# Patient Record
Sex: Female | Born: 1965
Health system: Southern US, Community
[De-identification: ages and names within clinical notes are randomized; demographics above are authoritative.]

## PROBLEM LIST (undated history)

## (undated) DIAGNOSIS — M1990 Unspecified osteoarthritis, unspecified site: Secondary | ICD-10-CM

## (undated) DIAGNOSIS — C541 Malignant neoplasm of endometrium: Secondary | ICD-10-CM

## (undated) DIAGNOSIS — R519 Headache, unspecified: Secondary | ICD-10-CM

## (undated) DIAGNOSIS — M349 Systemic sclerosis, unspecified: Secondary | ICD-10-CM

## (undated) DIAGNOSIS — K589 Irritable bowel syndrome without diarrhea: Secondary | ICD-10-CM

## (undated) DIAGNOSIS — D649 Anemia, unspecified: Secondary | ICD-10-CM

## (undated) DIAGNOSIS — K5780 Diverticulitis of intestine, part unspecified, with perforation and abscess without bleeding: Secondary | ICD-10-CM

## (undated) DIAGNOSIS — C569 Malignant neoplasm of unspecified ovary: Secondary | ICD-10-CM

## (undated) DIAGNOSIS — Z9889 Other specified postprocedural states: Secondary | ICD-10-CM

## (undated) DIAGNOSIS — E785 Hyperlipidemia, unspecified: Secondary | ICD-10-CM

## (undated) DIAGNOSIS — I82409 Acute embolism and thrombosis of unspecified deep veins of unspecified lower extremity: Secondary | ICD-10-CM

## (undated) DIAGNOSIS — R112 Nausea with vomiting, unspecified: Secondary | ICD-10-CM

## (undated) DIAGNOSIS — R7303 Prediabetes: Secondary | ICD-10-CM

## (undated) DIAGNOSIS — Z8669 Personal history of other diseases of the nervous system and sense organs: Secondary | ICD-10-CM

## (undated) DIAGNOSIS — F419 Anxiety disorder, unspecified: Secondary | ICD-10-CM

## (undated) DIAGNOSIS — J690 Pneumonitis due to inhalation of food and vomit: Secondary | ICD-10-CM

## (undated) DIAGNOSIS — M329 Systemic lupus erythematosus, unspecified: Secondary | ICD-10-CM

## (undated) DIAGNOSIS — I10 Essential (primary) hypertension: Secondary | ICD-10-CM

## (undated) DIAGNOSIS — R05 Cough: Secondary | ICD-10-CM

## (undated) DIAGNOSIS — N12 Tubulo-interstitial nephritis, not specified as acute or chronic: Secondary | ICD-10-CM

## (undated) DIAGNOSIS — Z8489 Family history of other specified conditions: Secondary | ICD-10-CM

## (undated) DIAGNOSIS — Z95828 Presence of other vascular implants and grafts: Secondary | ICD-10-CM

## (undated) DIAGNOSIS — R51 Headache: Secondary | ICD-10-CM

## (undated) DIAGNOSIS — K219 Gastro-esophageal reflux disease without esophagitis: Secondary | ICD-10-CM

## (undated) DIAGNOSIS — T7840XA Allergy, unspecified, initial encounter: Secondary | ICD-10-CM

## (undated) DIAGNOSIS — Z8709 Personal history of other diseases of the respiratory system: Secondary | ICD-10-CM

## (undated) DIAGNOSIS — Z5189 Encounter for other specified aftercare: Secondary | ICD-10-CM

## (undated) DIAGNOSIS — N159 Renal tubulo-interstitial disease, unspecified: Secondary | ICD-10-CM

## (undated) DIAGNOSIS — O2230 Deep phlebothrombosis in pregnancy, unspecified trimester: Secondary | ICD-10-CM

## (undated) HISTORY — DX: Diverticulitis of intestine, part unspecified, with perforation and abscess without bleeding: K57.80

## (undated) HISTORY — DX: Encounter for other specified aftercare: Z51.89

## (undated) HISTORY — DX: Irritable bowel syndrome, unspecified: K58.9

## (undated) HISTORY — DX: Unspecified osteoarthritis, unspecified site: M19.90

## (undated) HISTORY — DX: Anxiety disorder, unspecified: F41.9

## (undated) HISTORY — DX: Systemic lupus erythematosus, unspecified: M32.9

## (undated) HISTORY — PX: COLOSTOMY TAKEDOWN: SHX5258

## (undated) HISTORY — DX: Hyperlipidemia, unspecified: E78.5

## (undated) HISTORY — DX: Allergy, unspecified, initial encounter: T78.40XA

## (undated) HISTORY — DX: Essential (primary) hypertension: I10

## (undated) HISTORY — DX: Personal history of other diseases of the nervous system and sense organs: Z86.69

## (undated) HISTORY — DX: Systemic sclerosis, unspecified: M34.9

## (undated) HISTORY — DX: Pneumonitis due to inhalation of food and vomit: J69.0

---

## 1995-04-08 HISTORY — PX: TONSILLECTOMY AND ADENOIDECTOMY: SUR1326

## 1997-09-21 ENCOUNTER — Encounter: Admission: RE | Admit: 1997-09-21 | Discharge: 1997-12-20 | Payer: Self-pay | Admitting: Family Medicine

## 1997-11-29 ENCOUNTER — Emergency Department (HOSPITAL_COMMUNITY): Admission: EM | Admit: 1997-11-29 | Discharge: 1997-11-29 | Payer: Self-pay | Admitting: Emergency Medicine

## 1998-11-21 ENCOUNTER — Emergency Department (HOSPITAL_COMMUNITY): Admission: EM | Admit: 1998-11-21 | Discharge: 1998-11-21 | Payer: Self-pay | Admitting: Emergency Medicine

## 1998-12-30 ENCOUNTER — Encounter: Payer: Self-pay | Admitting: Emergency Medicine

## 1998-12-30 ENCOUNTER — Emergency Department (HOSPITAL_COMMUNITY): Admission: EM | Admit: 1998-12-30 | Discharge: 1998-12-30 | Payer: Self-pay | Admitting: Emergency Medicine

## 1999-10-06 ENCOUNTER — Encounter: Payer: Self-pay | Admitting: Emergency Medicine

## 1999-10-06 ENCOUNTER — Emergency Department (HOSPITAL_COMMUNITY): Admission: EM | Admit: 1999-10-06 | Discharge: 1999-10-06 | Payer: Self-pay | Admitting: Emergency Medicine

## 2000-03-15 ENCOUNTER — Emergency Department (HOSPITAL_COMMUNITY): Admission: EM | Admit: 2000-03-15 | Discharge: 2000-03-15 | Payer: Self-pay

## 2000-07-29 ENCOUNTER — Encounter: Payer: Self-pay | Admitting: Family Medicine

## 2000-07-29 ENCOUNTER — Ambulatory Visit (HOSPITAL_COMMUNITY): Admission: RE | Admit: 2000-07-29 | Discharge: 2000-07-29 | Payer: Self-pay | Admitting: Family Medicine

## 2000-08-05 ENCOUNTER — Encounter: Admission: RE | Admit: 2000-08-05 | Discharge: 2000-08-05 | Payer: Self-pay | Admitting: Family Medicine

## 2000-08-05 ENCOUNTER — Encounter: Payer: Self-pay | Admitting: Family Medicine

## 2000-08-20 ENCOUNTER — Encounter: Admission: RE | Admit: 2000-08-20 | Discharge: 2000-11-18 | Payer: Self-pay | Admitting: Family Medicine

## 2001-02-08 ENCOUNTER — Encounter: Payer: Self-pay | Admitting: Family Medicine

## 2001-02-08 ENCOUNTER — Encounter: Admission: RE | Admit: 2001-02-08 | Discharge: 2001-02-08 | Payer: Self-pay | Admitting: Family Medicine

## 2001-09-21 ENCOUNTER — Encounter: Payer: Self-pay | Admitting: *Deleted

## 2001-09-21 ENCOUNTER — Emergency Department (HOSPITAL_COMMUNITY): Admission: EM | Admit: 2001-09-21 | Discharge: 2001-09-21 | Payer: Self-pay | Admitting: *Deleted

## 2001-10-08 ENCOUNTER — Emergency Department (HOSPITAL_COMMUNITY): Admission: EM | Admit: 2001-10-08 | Discharge: 2001-10-08 | Payer: Self-pay | Admitting: Emergency Medicine

## 2001-10-08 ENCOUNTER — Encounter: Payer: Self-pay | Admitting: Emergency Medicine

## 2001-10-15 ENCOUNTER — Encounter: Payer: Self-pay | Admitting: Family Medicine

## 2001-10-15 ENCOUNTER — Encounter: Admission: RE | Admit: 2001-10-15 | Discharge: 2001-10-15 | Payer: Self-pay | Admitting: Family Medicine

## 2002-07-12 ENCOUNTER — Emergency Department (HOSPITAL_COMMUNITY): Admission: EM | Admit: 2002-07-12 | Discharge: 2002-07-12 | Payer: Self-pay | Admitting: Emergency Medicine

## 2002-08-08 ENCOUNTER — Emergency Department (HOSPITAL_COMMUNITY): Admission: EM | Admit: 2002-08-08 | Discharge: 2002-08-08 | Payer: Self-pay | Admitting: Emergency Medicine

## 2002-08-08 ENCOUNTER — Encounter: Payer: Self-pay | Admitting: Emergency Medicine

## 2003-01-14 ENCOUNTER — Emergency Department (HOSPITAL_COMMUNITY): Admission: EM | Admit: 2003-01-14 | Discharge: 2003-01-15 | Payer: Self-pay | Admitting: Emergency Medicine

## 2003-05-03 ENCOUNTER — Ambulatory Visit (HOSPITAL_COMMUNITY): Admission: RE | Admit: 2003-05-03 | Discharge: 2003-05-03 | Payer: Self-pay | Admitting: Family Medicine

## 2003-05-19 ENCOUNTER — Encounter: Admission: RE | Admit: 2003-05-19 | Discharge: 2003-08-17 | Payer: Self-pay | Admitting: Internal Medicine

## 2003-12-04 ENCOUNTER — Ambulatory Visit (HOSPITAL_COMMUNITY): Admission: RE | Admit: 2003-12-04 | Discharge: 2003-12-04 | Payer: Self-pay | Admitting: Nurse Practitioner

## 2004-01-04 ENCOUNTER — Ambulatory Visit: Payer: Self-pay | Admitting: Nurse Practitioner

## 2004-01-04 ENCOUNTER — Ambulatory Visit: Payer: Self-pay | Admitting: *Deleted

## 2004-05-10 ENCOUNTER — Ambulatory Visit: Payer: Self-pay | Admitting: Nurse Practitioner

## 2004-05-10 ENCOUNTER — Ambulatory Visit (HOSPITAL_COMMUNITY): Admission: RE | Admit: 2004-05-10 | Discharge: 2004-05-10 | Payer: Self-pay | Admitting: Nurse Practitioner

## 2004-11-20 ENCOUNTER — Emergency Department (HOSPITAL_COMMUNITY): Admission: EM | Admit: 2004-11-20 | Discharge: 2004-11-21 | Payer: Self-pay | Admitting: Emergency Medicine

## 2004-12-20 ENCOUNTER — Encounter: Admission: RE | Admit: 2004-12-20 | Discharge: 2004-12-20 | Payer: Self-pay | Admitting: Gastroenterology

## 2005-03-11 ENCOUNTER — Encounter: Admission: RE | Admit: 2005-03-11 | Discharge: 2005-03-11 | Payer: Self-pay | Admitting: Family Medicine

## 2006-01-30 ENCOUNTER — Other Ambulatory Visit: Admission: RE | Admit: 2006-01-30 | Discharge: 2006-01-30 | Payer: Self-pay | Admitting: Obstetrics and Gynecology

## 2006-04-07 HISTORY — PX: COLOSTOMY: SHX63

## 2006-05-13 ENCOUNTER — Emergency Department (HOSPITAL_COMMUNITY): Admission: EM | Admit: 2006-05-13 | Discharge: 2006-05-13 | Payer: Self-pay | Admitting: Emergency Medicine

## 2007-07-05 ENCOUNTER — Inpatient Hospital Stay (HOSPITAL_COMMUNITY): Admission: EM | Admit: 2007-07-05 | Discharge: 2007-07-13 | Payer: Self-pay | Admitting: Emergency Medicine

## 2007-07-05 ENCOUNTER — Encounter (INDEPENDENT_AMBULATORY_CARE_PROVIDER_SITE_OTHER): Payer: Self-pay | Admitting: General Surgery

## 2008-01-19 ENCOUNTER — Emergency Department (HOSPITAL_COMMUNITY): Admission: EM | Admit: 2008-01-19 | Discharge: 2008-01-19 | Payer: Self-pay | Admitting: Emergency Medicine

## 2008-04-07 DIAGNOSIS — K5780 Diverticulitis of intestine, part unspecified, with perforation and abscess without bleeding: Secondary | ICD-10-CM

## 2008-04-07 HISTORY — DX: Diverticulitis of intestine, part unspecified, with perforation and abscess without bleeding: K57.80

## 2008-09-06 ENCOUNTER — Encounter: Admission: RE | Admit: 2008-09-06 | Discharge: 2008-09-06 | Payer: Self-pay | Admitting: General Surgery

## 2008-12-20 ENCOUNTER — Encounter: Admission: RE | Admit: 2008-12-20 | Discharge: 2008-12-20 | Payer: Self-pay | Admitting: Internal Medicine

## 2009-03-21 ENCOUNTER — Inpatient Hospital Stay (HOSPITAL_COMMUNITY): Admission: RE | Admit: 2009-03-21 | Discharge: 2009-03-28 | Payer: Self-pay | Admitting: General Surgery

## 2009-04-15 ENCOUNTER — Emergency Department (HOSPITAL_COMMUNITY): Admission: EM | Admit: 2009-04-15 | Discharge: 2009-04-15 | Payer: Self-pay | Admitting: Emergency Medicine

## 2009-09-09 ENCOUNTER — Emergency Department (HOSPITAL_COMMUNITY): Admission: EM | Admit: 2009-09-09 | Discharge: 2009-09-09 | Payer: Self-pay | Admitting: Emergency Medicine

## 2009-09-10 ENCOUNTER — Emergency Department (HOSPITAL_COMMUNITY): Admission: EM | Admit: 2009-09-10 | Discharge: 2009-09-10 | Payer: Self-pay | Admitting: Emergency Medicine

## 2010-06-23 LAB — URINALYSIS, ROUTINE W REFLEX MICROSCOPIC
Glucose, UA: NEGATIVE mg/dL
Ketones, ur: NEGATIVE mg/dL
Protein, ur: >300 mg/dL — AB
Specific Gravity, Urine: 1.02 (ref 1.005–1.030)

## 2010-06-23 LAB — URINE CULTURE: Colony Count: >=100000

## 2010-06-23 LAB — URINE MICROSCOPIC-ADD ON

## 2010-06-23 LAB — POCT PREGNANCY, URINE: Preg Test, Ur: NEGATIVE

## 2010-06-24 LAB — POCT I-STAT, CHEM 8: Potassium: 4.4 mEq/L (ref 3.5–5.1)

## 2010-07-08 LAB — BASIC METABOLIC PANEL
CO2: 26 mEq/L (ref 19–32)
Calcium: 8.4 mg/dL (ref 8.4–10.5)
Chloride: 103 mEq/L (ref 96–112)
GFR calc non Af Amer: >60 mL/min (ref >60)
Sodium: 134 mEq/L — ABNORMAL LOW (ref 135–145)

## 2010-07-08 LAB — CBC
HCT: 31.6 % — ABNORMAL LOW (ref 36.0–46.0)
HCT: 33.3 % — ABNORMAL LOW (ref 36.0–46.0)
HCT: 34.7 % — ABNORMAL LOW (ref 36.0–46.0)
Hemoglobin: 10.5 g/dL — ABNORMAL LOW (ref 12.0–15.0)
Hemoglobin: 11.5 g/dL — ABNORMAL LOW (ref 12.0–15.0)
MCV: 91.2 fL (ref 78.0–100.0)
MCV: 92 fL (ref 78.0–100.0)
RBC: 3.49 MIL/uL — ABNORMAL LOW (ref 3.87–5.11)
RBC: 3.8 MIL/uL — ABNORMAL LOW (ref 3.87–5.11)
RDW: 13.6 % (ref 11.5–15.5)
RDW: 13.8 % (ref 11.5–15.5)
WBC: 14.3 10*3/uL — ABNORMAL HIGH (ref 4.0–10.5)
WBC: 7.1 10*3/uL (ref 4.0–10.5)

## 2010-07-09 LAB — DIFFERENTIAL
Basophils Absolute: 0 10*3/uL (ref 0.0–0.1)
Basophils Relative: 0 % (ref 0–1)
Eosinophils Absolute: 0.1 10*3/uL (ref 0.0–0.7)
Eosinophils Relative: 2 % (ref 0–5)
Lymphocytes Relative: 19 % (ref 12–46)
Lymphs Abs: 1.4 10*3/uL (ref 0.7–4.0)
Monocytes Relative: 6 % (ref 3–12)
Neutro Abs: 5.4 10*3/uL (ref 1.7–7.7)
Neutrophils Relative %: 73 % (ref 43–77)

## 2010-07-09 LAB — COMPREHENSIVE METABOLIC PANEL
AST: 15 U/L (ref 0–37)
Albumin: 3.4 g/dL — ABNORMAL LOW (ref 3.5–5.2)
Alkaline Phosphatase: 44 U/L (ref 39–117)
CO2: 30 mEq/L (ref 19–32)
Creatinine, Ser: 0.54 mg/dL (ref 0.4–1.2)
GFR calc Af Amer: >60 mL/min (ref >60)
Potassium: 4.6 mEq/L (ref 3.5–5.1)
Total Protein: 6.1 g/dL (ref 6.0–8.3)

## 2010-07-09 LAB — CBC
Platelets: 190 10*3/uL (ref 150–400)
RBC: 4.04 MIL/uL (ref 3.87–5.11)

## 2010-07-09 LAB — PREGNANCY, URINE: Preg Test, Ur: NEGATIVE

## 2010-08-02 ENCOUNTER — Emergency Department (HOSPITAL_COMMUNITY)
Admission: EM | Admit: 2010-08-02 | Discharge: 2010-08-03 | Disposition: A | Discharge status: Home / Self Care | Payer: Commercial Indemnity | Attending: Emergency Medicine | Admitting: Emergency Medicine

## 2010-08-02 ENCOUNTER — Emergency Department (HOSPITAL_COMMUNITY): Payer: Commercial Indemnity

## 2010-08-02 DIAGNOSIS — I880 Nonspecific mesenteric lymphadenitis: Secondary | ICD-10-CM | POA: Insufficient documentation

## 2010-08-02 DIAGNOSIS — R109 Unspecified abdominal pain: Secondary | ICD-10-CM | POA: Insufficient documentation

## 2010-08-02 DIAGNOSIS — Z9889 Other specified postprocedural states: Secondary | ICD-10-CM | POA: Insufficient documentation

## 2010-08-02 DIAGNOSIS — K439 Ventral hernia without obstruction or gangrene: Secondary | ICD-10-CM | POA: Insufficient documentation

## 2010-08-02 DIAGNOSIS — I10 Essential (primary) hypertension: Secondary | ICD-10-CM | POA: Insufficient documentation

## 2010-08-02 LAB — URINALYSIS, ROUTINE W REFLEX MICROSCOPIC
Bilirubin Urine: NEGATIVE
Glucose, UA: NEGATIVE mg/dL
Hgb urine dipstick: NEGATIVE
Ketones, ur: NEGATIVE mg/dL
Nitrite: NEGATIVE
Protein, ur: NEGATIVE mg/dL
Specific Gravity, Urine: 1.021 (ref 1.005–1.030)
Urobilinogen, UA: 0.2 mg/dL (ref 0.0–1.0)
pH: 7 (ref 5.0–8.0)

## 2010-08-02 LAB — CBC
HCT: 39.6 % (ref 36.0–46.0)
Hemoglobin: 13.1 g/dL (ref 12.0–15.0)
MCH: 28.9 pg (ref 26.0–34.0)
MCHC: 33.1 g/dL (ref 30.0–36.0)
MCV: 87.2 fL (ref 78.0–100.0)
Platelets: 252 10*3/uL (ref 150–400)
RBC: 4.54 MIL/uL (ref 3.87–5.11)
RDW: 13.1 % (ref 11.5–15.5)
WBC: 11.1 K/uL — ABNORMAL HIGH (ref 4.0–10.5)

## 2010-08-02 LAB — COMPREHENSIVE METABOLIC PANEL WITH GFR
ALT: 14 U/L (ref 0–35)
Alkaline Phosphatase: 61 U/L (ref 39–117)
BUN: 10 mg/dL (ref 6–23)
Chloride: 105 meq/L (ref 96–112)
Glucose, Bld: 94 mg/dL (ref 70–99)
Potassium: 4.3 meq/L (ref 3.5–5.1)
Total Bilirubin: 0.6 mg/dL (ref 0.3–1.2)

## 2010-08-02 LAB — DIFFERENTIAL
Basophils Absolute: 0 10*3/uL (ref 0.0–0.1)
Basophils Relative: 0 % (ref 0–1)
Eosinophils Absolute: 0.3 10*3/uL (ref 0.0–0.7)
Eosinophils Relative: 2 % (ref 0–5)
Lymphocytes Relative: 26 % (ref 12–46)
Lymphs Abs: 2.9 K/uL (ref 0.7–4.0)
Monocytes Absolute: 1.2 10*3/uL — ABNORMAL HIGH (ref 0.1–1.0)
Monocytes Relative: 11 % (ref 3–12)
Neutro Abs: 6.7 K/uL (ref 1.7–7.7)
Neutrophils Relative %: 61 % (ref 43–77)

## 2010-08-02 LAB — COMPREHENSIVE METABOLIC PANEL
AST: 19 U/L (ref 0–37)
Albumin: 3.6 g/dL (ref 3.5–5.2)
CO2: 27 mEq/L (ref 19–32)
Calcium: 8.9 mg/dL (ref 8.4–10.5)
Creatinine, Ser: 0.77 mg/dL (ref 0.4–1.2)
GFR calc Af Amer: >60 mL/min (ref >60)
GFR calc non Af Amer: >60 mL/min (ref >60)
Sodium: 139 mEq/L (ref 135–145)
Total Protein: 7.5 g/dL (ref 6.0–8.3)

## 2010-08-02 LAB — LIPASE, BLOOD: Lipase: 23 U/L (ref 11–59)

## 2010-08-02 MED ORDER — IOHEXOL 300 MG/ML  SOLN
125.0000 mL | Freq: Once | INTRAMUSCULAR | Status: AC | PRN
  Administered 2010-08-02: 125 mL via INTRAVENOUS

## 2010-08-20 NOTE — H&P (Signed)
[name] NO.:  [id number]   MEDICAL RECORD NO.:  [id number]          PATIENT TYPE:  EMS   LOCATION:  ED                           FACILITY:  [facility]   PHYSICIAN:  Anselm Pancoast. Weatherly, M.D.DATE OF BIRTH:  [birth year]   DATE OF ADMISSION:  07/05/2007  DATE OF DISCHARGE:                              HISTORY & PHYSICAL   CHIEF COMPLAINT:  Generalized abdominal pain of about 8 hours' duration.   HISTORY:  [name] is a 45 year old overweight black female who  presented to the emergency room at approximately 4 a.m. with about a 6  or 8-hour history of her progressive generalized abdominal pain.  She  has a history of diverticulitis, has had a CT about 2 years ago under  Dr. Marlane Hatcher care.  She has had multiple episodes of abdominal pain but  this is the most intense.  She has had a colonoscopy about two years  ago.  She says she has not recently been on antibiotics and she also has  a history of high blood pressure and Dr. Nehemiah Settle of [facility] is  her regular physician.  She said yesterday she started having  generalized abdominal pain.  She says it has become more intense and she  came to the emergency room at approximately 4 a.m.  She was not febrile  but had a slow pulse of about 90 but described as generalized abdominal  tenderness and seen by the ER physician, I am not sure who originally  saw her.  They treated her with pain medication.  Lab studies was  obtained.  The urinalysis was unremarkable.  The white count is 14,200.  A CMET was unremarkable with a BUN of 10, a glucose of 147, and then a  CT was ordered with contrast.  This was done at approximately 8 o'clock  and it shows a small amount of free air throughout the peritoneal cavity  with diverticulitis, and most likely the perforation is right at the  descending colon and proximal sigmoid.  She has been started on Cipro  and Flagyl.  Because of the diffuse abdominal pain and small  amount of  air throughout the peritoneal cavity, I think a laparotomy and an end-  colostomy is going to be needed with resection of the area of  perforation.  I discussed with the patient.  She said that she has not  been on antibiotics recently but she has had numerous episodes of kind  of milder pain and this has been kind of a chronic recurring problem.   FAMILY HISTORY:  She is separated from her husband.  She has had two  previous C-sections through a midline incision and says she has just  recently made contact with her daughter.  She works at [employer] as a  Merchandiser, retail.   Her periods are regular and pregnancy test is negative.  I am not sure  where her weight is recorded.  She was seen by Dr. Effie Shy in the early  night.   PHYSICAL EXAM:  She is a pleasant, overweight female, kind of  generalized mild abdominal tenderness with muscle  guarding, diffuse.  VITAL SIGNS:  When she came in her blood pressure was 100/54, afebrile  at 98.5, pulse 88, respirations 18.   EYES, EARS, NOSE AND THROAT:  Unremarkable.  LUNGS:  Clear.  CARDIAC:  Normal sinus rhythm.  ABDOMEN:  She is diffusely tender.  She has a well-healed midline  incision.  She is significantly overweight.  I do not appreciate any  hernias.  I did not do her pelvic and rectal exams since the CT obviously shows a  perforation.  EXTREMITIES:  There is no pedal edema.   PLAN:  The patient has been started on Cipro and Flagyl.  We added her  to the OR schedule.  The patient will be laparotomy and washout,  resection of the perforation and an end-colostomy, and she is aware that  she will have a colostomy for approximately 4-6 months.   DIAGNOSES:  1. Perforated sigmoid diverticulitis.  2. History of hypertension.  3. Exogenous obesity.           ______________________________  Anselm Pancoast. Zachery Dakins, M.D.     WJW/MEDQ  D:  07/05/2007  T:  07/05/2007  Job:  [id number]

## 2010-08-20 NOTE — Op Note (Signed)
[name], [name] NO.:  [id number]   MEDICAL RECORD NO.:  [id number]          PATIENT TYPE:  INP   LOCATION:  0106                         FACILITY:  [facility]   PHYSICIAN:  Anselm Pancoast. Weatherly, M.D.DATE OF BIRTH:  [birth year]   DATE OF PROCEDURE:  07/05/2007  DATE OF DISCHARGE:                               OPERATIVE REPORT   PREOPERATIVE DIAGNOSIS:  Perforated sigmoid diverticulitis.   POSTOPERATIVE DIAGNOSIS:  Perforated sigmoid diverticulitis.   OPERATIONS:  Exploratory laparotomy, partial sigmoid colectomy, end  colostomy and Hartmann.   ANESTHESIA:  General anesthesia.   ASSISTANT:  1. Ardeth Sportsman, MD  2. Wilmon Arms. Tsuei, M.D.   SURGEON:  Anselm Pancoast. Zachery Dakins, M.D.   HISTORY:  [name] is a 45 year old obese female who presented  to the emergency room with approximately 6-hour history of progressive  abdominal pain and gave the following history.  Dr. Nehemiah Settle is her  regular physician.  Dr. Ewing Schlein, I think is her gastroenterologist.  She  has had previous episodes of diverticulosis and diverticulitis, had a CT  about 2 years ago and had a colonoscopy about 2 years ago.  She was  treated with stool softeners and etc. is on Norvasc for high blood  pressure.  She is moderately overweight and stated that she had had  progressive pain starting in the left lower quadrant and kind of all  over the abdomen prior to coming to the emergency room approximately  4:00 a.m.  She had a mildly elevated white count examined by Dr. Effie Shy  and then a CT was obtained which was completed approximately 9:00 a.m.  and this showed free air throughout the peritoneal cavity.  We could see  what looks like a perforated sigmoid colon at the proximal sigmoid colon  just below the descending colon area.  I recommended that we go ahead  and explore her.  They had started her on Cipro and Flagyl and the  patient was in agreement.  It was explained that she would get a  temporary colostomy.   No additional antibiotics.  She was taken to the operative suite.  Foley  catheter was inserted after she was induction of general anesthesia and  we later per an NG tube through the nose into the stomach.  The abdomen  was prepped with Betadine solution and draped in sterile manner after a  Foley catheter had been inserted.  I made a small incision above and  below the umbilicus where she has had an old previous C-section incision  and upon carefully entering into the peritoneal cavity was just a lot of  small bowel contents and infection that was kind of up into the  incision.  I then opened the incision about four inches lower and also  about 4 inches above the umbilicus and you could feel the inflamed area  of proximal sigmoid colon and could see where it looked like the  perforation was.  There was fairly marked diverticulitis in this  localized segment and I elected to transect kind of the, probably the  midsigmoid, possibly probably the junction between the proximal two-  thirds and distal third of sigmoid colon and then later I marked the  area that stays with 2-0 Prolene sutures.  I then used the harmonic  scalpel to kind of separate the mesentery very close to the colon, did  not attempt to attempt to identify the ureter but we were up real high  to the colon and then I could kind of milk the stool which was fairly  marked into this area and then used a TA 60 to go across the area just  above where the descending colon starts.  The area of inflamed colon was  then removed the field and sent for permanent exam.  I had checked and  got cultures aerobically, anaerobically and they came back Gram stain  and they did not see  organisms but I am sure there will be bacteria.  We then thoroughly irrigated with probably about 5 liters of saline  until everything is basically returning clear and then Dr. Corliss Skains had  scrubbed on in after the sigmoid colon had been  resected and then we  mobilized the descending colon, did not actually go up to the splenic  flexure but picked the area for the colostomy brought out really above  the umbilicus.  About a two finger opening was made through the left  rectus and the mesentery and then we placed a few 3-0 silk sutures kind  of suturing the bowel wall to the fascia.  Next, after thoroughly  irrigating and looking I elected not to place a drain but wash  everything until everything was returning clear and then closed the  midline fascia, used a 0 Vicryl in the peritoneum inferiorly and then #1  Novofil full-thickness above the umbilicus area.  The skin after wound  was carefully irrigated and good hemostasis obtained was closed loosely  with staples and I put Telfa wicks in the subcutaneous tissue which is  probably about 3 cm in thickness and then I went ahead and matured the  colostomy.  It comes up, it is flat with the skin edge, is definitely  viable and I used 3-0 Vicryl to close the skin edges.  A colostomy bag  was placed over the stoma and then the patient was taken to the recovery  room.  We will continue on the Cipro and Flagyl, PCA morphine for  control and hopefully it will be about four months before she is able to  get her colostomy taken down.           ______________________________  Anselm Pancoast. Zachery Dakins, M.D.     WJW/MEDQ  D:  07/05/2007  T:  07/06/2007  Job:  [id number]   cc:   Deirdre Peer. Polite, M.D.   John C. Madilyn Fireman, M.D.  Fax: [phone]

## 2010-08-23 NOTE — Discharge Summary (Signed)
Alison, Thompson            ACCOUNT NO.:  [id number]   MEDICAL RECORD NO.:  [id number]          PATIENT TYPE:  INP   LOCATION:  1537                         FACILITY:  [facility]   PHYSICIAN:  Anselm Pancoast. Weatherly, M.D.DATE OF BIRTH:  12/04/1965   DATE OF ADMISSION:  07/05/2007  DATE OF DISCHARGE:  07/13/2007                               DISCHARGE SUMMARY   DISCHARGE DIAGNOSES:  1. Perforated sigmoid diverticulitis with generalized peritonitis.  2. Obesity.   HISTORY:  [name] is an overweight 45 year old black female who  presented to the emergency room approximately 4 a.m. with an 8 hour  history of progressive generalized abdominal pain.  She had had a  previous history of diverticulitis and had a CT about 2 years earlier in  Dr. Marlane Hatcher care and had episodes of similar kind of milder pain in the  interim.  She had had a colonoscopy 2 years earlier and recently had  been on antibiotics under Dr. Idelle Crouch care.  She said yesterday she  started having generalized abdominal pain.  It became more intense and  she presented to the emergency room approximately 4 a.m.  She was not  febrile and had a slow pulse but was seen by the ER physician and they  treated her with pain medication and lab studies were ordered.  Urinalysis was negative.  White count was 14,200.  A CT was performed  which showed a small amount of free air throughout the peritoneal cavity  and also some fluid throughout the peritoneal cavity.  The perforation  appeared to be in the descending proximal sigmoid colon.  She was  started on Cipro and Flagyl and because of diffuse abdominal pain and  the free air I recommended that we proceed on with a surgery.  At  surgery she was found to have a very generalized peritonitis.  Dr. Michaell Cowing  first and then switched off to Dr. Corliss Skains was my assistant and we were  able to identify the area of perforation which was in the sigmoid colon  and this was removed and an end  colostomy and Hartmann procedure was  performed.  She had a thorough irrigation of the peritoneal cavity and  then the abdomen was closed and wicks were placed in the skin, loosely  closing the skin with staples.  She was continued on Cipro and Flagyl  and was less tender the following day, passed a little gas out of the  ostomy approximately 2 days after surgery and Dr. Corliss Skains followed her in  my absence as I was going on vacation.   Her colostomy started functioning, her diet was started and advanced and  she had one episode of nausea and vomiting on approximately the third  day after surgery but this resolved.  Her diet was continued to be  advanced and she was ready for discharge on April 7, seven days after  surgery.  White count was normal and she was discharged on Flagyl 500 mg  q.i.d. and Cipro 500 mg b.i.d. and to continue her usual blood pressure  medications.  Her wound appeared to be healing without problems and  she  will be seen in the office in 1 week.  We plan on doing her  colostomy takedown and etc. in approximately 4 months and will have the  colon reexamined either with barium enema or colonoscopy prior to that.  The path report showed perforated sigmoid diverticulitis with no  evidence of any malignancy.           ______________________________  Anselm Pancoast. Zachery Dakins, M.D.     WJW/MEDQ  D:  07/24/2007  T:  07/24/2007  Job:  [id number]   cc:   Petra Kuba, M.D.  Fax: [phone]

## 2010-12-30 LAB — DIFFERENTIAL
Basophils Absolute: 0.1
Basophils Relative: 0
Eosinophils Absolute: 0.1
Eosinophils Relative: 1
Monocytes Absolute: 0.6

## 2010-12-30 LAB — BODY FLUID CULTURE: Culture: NO GROWTH

## 2010-12-30 LAB — URINALYSIS, ROUTINE W REFLEX MICROSCOPIC
Glucose, UA: NEGATIVE
Protein, ur: NEGATIVE
Specific Gravity, Urine: 1.023

## 2010-12-30 LAB — COMPREHENSIVE METABOLIC PANEL
AST: 18
Albumin: 3.4 — ABNORMAL LOW
Alkaline Phosphatase: 69
BUN: 10
CO2: 25
Chloride: 107
GFR calc Af Amer: >60
Potassium: 3.4 — ABNORMAL LOW
Total Bilirubin: 0.6

## 2010-12-30 LAB — CBC
HCT: 33.7 — ABNORMAL LOW
HCT: 37.6
MCV: 90.9
Platelets: 179
Platelets: 246
RBC: 4.18
WBC: 14.2 — ABNORMAL HIGH
WBC: 14.5 — ABNORMAL HIGH

## 2010-12-30 LAB — BASIC METABOLIC PANEL
BUN: 8
Chloride: 104
Glucose, Bld: 120 — ABNORMAL HIGH
Potassium: 3.9

## 2010-12-30 LAB — GRAM STAIN

## 2010-12-31 LAB — URINALYSIS, ROUTINE W REFLEX MICROSCOPIC
Glucose, UA: NEGATIVE
Hgb urine dipstick: NEGATIVE
Protein, ur: 30 — AB
Specific Gravity, Urine: 1.037 — ABNORMAL HIGH
pH: 5.5

## 2010-12-31 LAB — CBC
HCT: 30.2 — ABNORMAL LOW
Platelets: 175
Platelets: ADEQUATE
RDW: 13
RDW: 13.3
WBC: 10.6 — ABNORMAL HIGH

## 2010-12-31 LAB — URINE MICROSCOPIC-ADD ON

## 2010-12-31 LAB — BASIC METABOLIC PANEL
BUN: 3 — ABNORMAL LOW
Creatinine, Ser: 0.68
GFR calc non Af Amer: >60

## 2010-12-31 LAB — URINE CULTURE: Colony Count: 7000

## 2011-05-27 ENCOUNTER — Encounter: Payer: Commercial Indemnity | Admitting: Family Medicine

## 2011-06-09 ENCOUNTER — Encounter: Payer: Self-pay | Admitting: Family Medicine

## 2011-06-09 ENCOUNTER — Ambulatory Visit (INDEPENDENT_AMBULATORY_CARE_PROVIDER_SITE_OTHER): Payer: Self-pay | Admitting: Family Medicine

## 2011-06-09 VITALS — BP 148/88 | HR 76 | Temp 97.9°F | Ht 65.6 in | Wt 338.0 lb

## 2011-06-09 DIAGNOSIS — E669 Obesity, unspecified: Secondary | ICD-10-CM

## 2011-06-09 DIAGNOSIS — Z0000 Encounter for general adult medical examination without abnormal findings: Secondary | ICD-10-CM

## 2011-06-09 DIAGNOSIS — Z23 Encounter for immunization: Secondary | ICD-10-CM

## 2011-06-09 DIAGNOSIS — R030 Elevated blood-pressure reading, without diagnosis of hypertension: Secondary | ICD-10-CM

## 2011-06-09 NOTE — Assessment & Plan Note (Signed)
Pt had a CPE last year with [facility].  Will request records.  May be due for pap, lipid screening, mammogram.

## 2011-06-09 NOTE — Progress Notes (Signed)
  Subjective:    Patient ID: [name], female    DOB: 19-Oct-1965, 46 y.o.   MRN: [id number]  HPI 74 you female here to establish care.  She was seen previously at [facility] but chose to switch to [facility] because her sister-in-law, Omari Koslosky, works here.  She had a recent physical last year with cholesterol testing.  Those records are not available today. PMH/SH/FH/PSH/meds and allergies are reviewed.    She would like a pap, but is currently on her period, so asks to defer. She reports a history of sinus infections with recent URI.  Has temp to 101.2 2 days ago, +throat pain, ear pain.  + nasal congestion and facial pain.  No fever currently, mostly sinus congestion, HA.  Took sinus medicine over the weekend, last dose last night.   She is also concerned about her weight.  She reports she has gained almost 100 lbs in the past year and a half.  Lots of stress at home - nephew died, daughter diagnosed with Hodgkins, starting treatment today.  Pt is not exercising much, but has recently started walking with her mother.  Her clothes do not fit and she feels miserable.  Also with financial stress.      Review of SystemsDenies chest pain and SOB.  No urinary c/o.  Regular menses with cramping.  +weight gain.  Told that she had scar tissue in 1986 and could not have more children.Feels down at times, but attributes to stress of past 2 years.     Objective:   Physical Exam  Constitutional: No distress.       Obese.  HENT:  Head: Normocephalic and atraumatic.  Right Ear: External ear normal.  Left Ear: External ear normal.  Mouth/Throat: Oropharynx is clear and moist. No oropharyngeal exudate.  Eyes: Conjunctivae are normal. Right eye exhibits no discharge. Left eye exhibits no discharge. No scleral icterus.       + maxillary sinus TTP  Neck: Normal range of motion. Neck supple. No tracheal deviation present. No thyromegaly present.  Cardiovascular: Normal rate, regular  rhythm and normal heart sounds.  Exam reveals no gallop and no friction rub.   No murmur heard. Pulmonary/Chest: Effort normal and breath sounds normal. No respiratory distress. She has no wheezes. She has no rales.  Lymphadenopathy:    She has no cervical adenopathy.  Skin: She is not diaphoretic.  Psychiatric: She has a normal mood and affect. Her behavior is normal. Judgment and thought content normal.          Assessment & Plan:

## 2011-06-09 NOTE — Assessment & Plan Note (Addendum)
Pt off meds for 2 years. She will try to lose weight.  She will return in 3 weeks for a physical, will recheck BP at that visit and possibly restart meds at that time.

## 2011-06-09 NOTE — Patient Instructions (Signed)
It was great to meet you today. I think your plan of exercise is great.  If you can walk 20- 30 minutes a day, that will really help. We will set up an appt with Dr. Gerilyn Pilgrim to talk about nutrition. We will request records from Dr. Nehemiah Settle. Please come back and see me in 3 weeks or so for a physical. Call us with any questions.

## 2011-06-09 NOTE — Assessment & Plan Note (Signed)
Pt interested in losing weight.  Already has started exercising with her mother.  Very interested in nutrition.  Suggested a food log, and will refer to nutrition.

## 2011-06-10 DIAGNOSIS — Z23 Encounter for immunization: Secondary | ICD-10-CM

## 2011-06-30 ENCOUNTER — Ambulatory Visit: Payer: Commercial Indemnity | Admitting: Family Medicine

## 2011-07-03 ENCOUNTER — Ambulatory Visit: Payer: Commercial Indemnity | Admitting: Family Medicine

## 2011-09-15 ENCOUNTER — Encounter: Payer: Self-pay | Admitting: Family Medicine

## 2011-09-15 ENCOUNTER — Ambulatory Visit (INDEPENDENT_AMBULATORY_CARE_PROVIDER_SITE_OTHER): Payer: Commercial Indemnity | Admitting: Family Medicine

## 2011-09-15 VITALS — BP 155/98 | HR 78 | Temp 97.7°F | Ht 65.2 in | Wt 382.0 lb

## 2011-09-15 DIAGNOSIS — R030 Elevated blood-pressure reading, without diagnosis of hypertension: Secondary | ICD-10-CM

## 2011-09-15 DIAGNOSIS — R631 Polydipsia: Secondary | ICD-10-CM

## 2011-09-15 DIAGNOSIS — R358 Other polyuria: Secondary | ICD-10-CM

## 2011-09-15 DIAGNOSIS — E669 Obesity, unspecified: Secondary | ICD-10-CM

## 2011-09-15 DIAGNOSIS — I10 Essential (primary) hypertension: Secondary | ICD-10-CM

## 2011-09-15 DIAGNOSIS — R609 Edema, unspecified: Secondary | ICD-10-CM

## 2011-09-15 NOTE — Patient Instructions (Signed)
No medicines today. Checking bloodwork. Make an appt for blood pressure check in 2 weeks, nurse visit. Try to limit salt intake.   It was good to meet you today!

## 2011-09-16 DIAGNOSIS — R609 Edema, unspecified: Secondary | ICD-10-CM | POA: Insufficient documentation

## 2011-09-16 LAB — COMPREHENSIVE METABOLIC PANEL
ALT: 9 U/L (ref 0–35)
AST: 14 U/L (ref 0–37)
CO2: 27 mEq/L (ref 19–32)
Calcium: 9.7 mg/dL (ref 8.4–10.5)
Chloride: 104 mEq/L (ref 96–112)
Sodium: 141 mEq/L (ref 135–145)
Total Bilirubin: 0.4 mg/dL (ref 0.3–1.2)
Total Protein: 6.6 g/dL (ref 6.0–8.3)

## 2011-09-16 LAB — CBC
MCHC: 33.4 g/dL (ref 30.0–36.0)
MCV: 88.2 fL (ref 78.0–100.0)
Platelets: 231 10*3/uL (ref 150–400)
RDW: 14.8 % (ref 11.5–15.5)
WBC: 10.8 10*3/uL — ABNORMAL HIGH (ref 4.0–10.5)

## 2011-09-16 NOTE — Assessment & Plan Note (Signed)
Patient brought up on her own how her obesity is affecting her life and health.   Has seen nutritionist in past Stated we have plenty of resources for her here and can help with this.  FU with PCP after 2 week nurse visit BP check.

## 2011-09-16 NOTE — Assessment & Plan Note (Signed)
FU 2 week nurse visit BP check

## 2011-09-16 NOTE — Assessment & Plan Note (Signed)
Unclear etiology.   I do not note any upper extremity or hand edema on my exam today, mostly seems to be obesity. However LE edema is present. Patient under considerable stressors in daily life, leading to increasing weight, taking more care of her daughter than herself, increasing blood pressure.   Edema possibly secondary to increasing HTN. Checking LFTs, renal function, CBC today.   FU in 2 weeks for blood pressure check.

## 2011-09-16 NOTE — Progress Notes (Signed)
  Subjective:    Patient ID: [name], female    DOB: July 21, 1965, 46 y.o.   MRN: [id number]  HPI  1.  Hand/foot swelling:  Present for past 2 weeks.  Notes mostly in evenings.  Relieved with elevation of legs and hands.  No facial swelling.  Has never had this problem before.  Does note history of hypertension, but was able to normalize pressures after losing almost 100 lbs several years ago.  Has since regained this weight back.  No increased salt intake per se, but does not increased "unhealthy food" intake and erratic food intake.  No long car trips, no redness of legs.   2.  Elevated blood pressure reading:  Possibly cause of #1.  No chest pain, shortness of breath, palpitations.  Patient does note significant weight gain since dealing with issues with daughter.    Of note, patient under considerable life stressors: daughter diagnosed with Hodgkin's Lymphoma and undergoing chemo, now daughter exhibiting cardiac side effects from medication and unable to finish chemo regimen, will be undergoing radiation soon.  Daughter and granddaughter now living with patient.    The following portions of the patient's history were reviewed and updated as appropriate: allergies, current medications, past medical history, family and social history, and problem list.  Patient is a nonsmoker.  Review of Systems See HPI above for review of systems.       Objective:   Physical Exam BP 155/98  Pulse 78  Temp(Src) 97.7 F (36.5 C) (Oral)  Ht 5' 5.2" (1.656 m)  Wt 382 lb (173.274 kg)  BMI 63.18 kg/m2 Gen: Well NAD HEENT: EOMI,  MMM Lungs: CTABL Nl WOB Heart: RRR no MRG Abd: NABS, NT, ND Exts: BL +2 pitting edema to ankle, +1 edema BL to mid-shin.  No erythema noted, no tenderness.  Hands:  No edema noted          Assessment & Plan:

## 2011-11-28 ENCOUNTER — Encounter: Payer: Self-pay | Admitting: Family Medicine

## 2011-11-28 ENCOUNTER — Ambulatory Visit (INDEPENDENT_AMBULATORY_CARE_PROVIDER_SITE_OTHER): Payer: Commercial Indemnity | Admitting: Family Medicine

## 2011-11-28 VITALS — BP 168/82 | Temp 98.0°F | Ht 65.6 in | Wt 374.6 lb

## 2011-11-28 DIAGNOSIS — M25561 Pain in right knee: Secondary | ICD-10-CM

## 2011-11-28 DIAGNOSIS — I10 Essential (primary) hypertension: Secondary | ICD-10-CM

## 2011-11-28 DIAGNOSIS — M25562 Pain in left knee: Secondary | ICD-10-CM

## 2011-11-28 DIAGNOSIS — R42 Dizziness and giddiness: Secondary | ICD-10-CM

## 2011-11-28 DIAGNOSIS — M25569 Pain in unspecified knee: Secondary | ICD-10-CM

## 2011-11-28 HISTORY — DX: Pain in right knee: M25.561

## 2011-11-28 MED ORDER — IBUPROFEN 800 MG PO TABS: 800.0000 mg | ORAL_TABLET | Freq: Three times a day (TID) | ORAL | Status: AC | PRN

## 2011-11-28 MED ORDER — HYDROCHLOROTHIAZIDE 25 MG PO TABS: 25.0000 mg | ORAL_TABLET | Freq: Every day | ORAL | Status: DC

## 2011-11-28 NOTE — Assessment & Plan Note (Signed)
Consistently elevated blood pressure readings. Diagnosed as Stage I HTN today.   HCTZ and FU in 2-3 weeks for recheck.

## 2011-11-28 NOTE — Assessment & Plan Note (Signed)
Concern is for fracture in patient who fell directly on tibial tuberosity. Plan for x-rays of knee today as she has not had much relief. Patient hesitant. NSAIDs for relief and x-rays next week if no improvement.   Examination revealed only tenderness of tuberosity.

## 2011-11-28 NOTE — Patient Instructions (Addendum)
Use the extra strength Ibuprofen over the weekend and when you need it. If you feel like it's really helping and your knee's getting, you don't have to worry about the x-rays. Use heat for relief as well.    If these things aren't really helping, I'd recommend the xray.   You don't need an appointment.    Your blood pressure has been consistently high so I'd like to start a medicine for that.   It is called hydrochlorothiazide 25 mg.    Come back and see me in 4 - 6 weeks.

## 2011-11-28 NOTE — Progress Notes (Signed)
Patient ID: [name], female   DOB: 1965-04-20, 46 y.o.   MRN: [id number] [name] is a 46 y.o. female who presents to [facility] today for Left knee pain and 1 episode of dizziness:  1.  Left knee pain:  Had fall about 2 months ago while at home, slipped on syrup that granddaughter had spilled everywhere on linoleum floor.  Caught herself on counter but did land on Left knee.  Noticed some bruising then.  Was using ice and able to work through pain.  Describes pain as aching.  Pain is worse at end of the day.  Soaking in bath, using warm water.  Taking Alleve, 2 pills twice a day, to help with the pain.  Feels that this is not really helping anymore.  No fevers or chills.  No giving out, no locking or clicking.  Soreness is main issue.    2.  Episode of vertigo:  Occurred Monday, 4 days ago.  Was standing and turned to walk down the hallway and took a few steps and realized that room was "spinning around me."  Someone was walking beside her and held her hand and helped her to a seat.  Resolved at that point, lasted for only a few minutes.  Never happened before, hasn't happened since.  It was about 5:30 in AM, she was up for work.  Had not had anything to eat or drink at that time.  Had not had anything to eat the night before either.    3. Hypertension:  Long-term problem for this patient.  No adverse effects from medication.  Not checking it regularly.  No HA, CP, dizziness, shortness of breath, palpitations, or LE swelling.   BP Readings from Last 3 Encounters:  11/28/11 168/82  09/15/11 155/98  06/09/11 148/88     The following portions of the patient's history were reviewed and updated as appropriate: allergies, current medications, past medical history, family and social history, and problem list.  Patient is a nonsmoker.  Past Medical History  Diagnosis Date  . Hypertension   . Hx of migraines   . IBS (irritable bowel syndrome)     ROS as above otherwise neg. No Chest pain,  palpitations, SOB, Fever, Chills, Abd pain, N/V/D.  Medications reviewed. Current Outpatient Prescriptions  Medication Sig Dispense Refill  . clobetasol cream (TEMOVATE) 0.05 % Apply topically 2 (two) times daily.        Exam:  BP 168/82  Temp 98 F (36.7 C) (Oral)  Ht 5' 5.6" (1.666 m)  Wt 374 lb 9.6 oz (169.917 kg)  BMI 61.20 kg/m2 Gen: Well NAD.  Obese.  Smiling and interactive.   HEENT: EOMI,  MMM Lungs: CTABL Nl WOB Heart: RRR no MRG Abd: obese, NT Exts: Obese.  Cannot quantify any appreciable edema.   MSK:   Left knee with no abnormalities noted. Right knee:  Body habitus obscures anatomic landmarks.  No tenderness to palpation except directly over tibial tuberosity.  No bruising noted.  Difficult to feel for effusion.  Full active and passive ROM without pain or limitations.  No ligamentous laxity noted, ant/posterior drawer negative. Patient does ambulate with slight limp. Neuro:  Alert and oriented x 3.  No gross neurological deficits noted  No results found for this or any previous visit (from the past 72 hour(s)).

## 2011-11-28 NOTE — Assessment & Plan Note (Signed)
Likely secondary to relative hypoglycemia as she had been without meal since lunch the day prior.   Had never had episode like this before or since. She is to call or return if recurs.

## 2011-12-24 ENCOUNTER — Ambulatory Visit: Payer: Commercial Indemnity | Admitting: Family Medicine

## 2011-12-25 ENCOUNTER — Ambulatory Visit: Payer: Commercial Indemnity | Admitting: Family Medicine

## 2011-12-29 ENCOUNTER — Ambulatory Visit: Payer: Commercial Indemnity | Admitting: Family Medicine

## 2013-07-26 ENCOUNTER — Ambulatory Visit (INDEPENDENT_AMBULATORY_CARE_PROVIDER_SITE_OTHER): Payer: BC Managed Care – PPO | Admitting: Family Medicine

## 2013-07-26 ENCOUNTER — Encounter: Payer: Self-pay | Admitting: Family Medicine

## 2013-07-26 VITALS — BP 149/86 | HR 73 | Temp 98.0°F | Ht 66.5 in | Wt 366.4 lb

## 2013-07-26 DIAGNOSIS — M25562 Pain in left knee: Principal | ICD-10-CM

## 2013-07-26 DIAGNOSIS — J302 Other seasonal allergic rhinitis: Secondary | ICD-10-CM

## 2013-07-26 DIAGNOSIS — E669 Obesity, unspecified: Secondary | ICD-10-CM

## 2013-07-26 DIAGNOSIS — I10 Essential (primary) hypertension: Secondary | ICD-10-CM

## 2013-07-26 DIAGNOSIS — J309 Allergic rhinitis, unspecified: Secondary | ICD-10-CM

## 2013-07-26 DIAGNOSIS — M25561 Pain in right knee: Secondary | ICD-10-CM

## 2013-07-26 DIAGNOSIS — M25569 Pain in unspecified knee: Secondary | ICD-10-CM

## 2013-07-26 LAB — COMPREHENSIVE METABOLIC PANEL
ALT: 10 U/L (ref 0–35)
AST: 13 U/L (ref 0–37)
Albumin: 3.9 g/dL (ref 3.5–5.2)
Alkaline Phosphatase: 54 U/L (ref 39–117)
BUN: 16 mg/dL (ref 6–23)
CHLORIDE: 104 meq/L (ref 96–112)
CO2: 25 meq/L (ref 19–32)
Calcium: 9.3 mg/dL (ref 8.4–10.5)
Creat: 0.89 mg/dL (ref 0.50–1.10)
GLUCOSE: 105 mg/dL — AB (ref 70–99)
POTASSIUM: 4.4 meq/L (ref 3.5–5.3)
SODIUM: 136 meq/L (ref 135–145)
Total Bilirubin: 0.4 mg/dL (ref 0.2–1.2)
Total Protein: 6.9 g/dL (ref 6.0–8.3)

## 2013-07-26 LAB — TSH: TSH: 2.426 u[IU]/mL (ref 0.350–4.500)

## 2013-07-26 LAB — CBC
HEMATOCRIT: 40.4 % (ref 36.0–46.0)
HEMOGLOBIN: 13.7 g/dL (ref 12.0–15.0)
MCH: 30 pg (ref 26.0–34.0)
MCHC: 33.9 g/dL (ref 30.0–36.0)
MCV: 88.6 fL (ref 78.0–100.0)
Platelets: 284 10*3/uL (ref 150–400)
RBC: 4.56 MIL/uL (ref 3.87–5.11)
RDW: 14.1 % (ref 11.5–15.5)
WBC: 8.2 10*3/uL (ref 4.0–10.5)

## 2013-07-26 LAB — LDL CHOLESTEROL, DIRECT: Direct LDL: 164 mg/dL — ABNORMAL HIGH

## 2013-07-26 MED ORDER — HYDROCHLOROTHIAZIDE 25 MG PO TABS
25.0000 mg | ORAL_TABLET | Freq: Every day | ORAL | Status: DC
Start: 2013-07-26 — End: 2015-02-21

## 2013-07-26 MED ORDER — TRAMADOL HCL 50 MG PO TABS: 50.0000 mg | ORAL_TABLET | Freq: Three times a day (TID) | ORAL | Status: DC | PRN

## 2013-07-26 NOTE — Patient Instructions (Signed)
We will check labs today.  I will send you for an xray of your knees.  I'll call you with the results.   Come back to see me in 4-6 weeks.  We'll recheck your blood pressure and do a pap smear then.  Keep using the Alleve for mild pain.  Take the Tramadol every 8 hours for severe pain if you need it.   Congratulations on the weight loss!  This is very difficult and you're doing all of the right things!

## 2013-07-26 NOTE — Progress Notes (Signed)
Subjective:    [name] is a 48 y.o. female who presents to [facility] today for several concerns besides her CPE:  Overall, difficulty with stress from recent separation, mostly financial.  Husband cheated on her.  Multiple separations prior, this is final.    1.  Knee pain:  Started 2 years ago after fall. Left knee.  Keeps her from working as much as she would like. Aches at night, pain worse at night.  Taking Alleve only when necessary, states not much relief from this.  Pain almost every day.    2.  Obesity:  Actively trying to lose weight for past 2 months.  Having to carpool with her daughter and granddaughter for work and multiple jobs throughout the day, which limits her time to exercise.     3. Hypertension:  Long-term problem for this patient.  No adverse effects from medication.  Not checking it regularly.  No HA, CP, dizziness, shortness of breath, palpitations, or LE swelling.   BP Readings from Last 3 Encounters:  07/26/13 149/86  11/28/11 168/82  09/15/11 155/98    4.  Allergies:  Staying away from "sinus" medicines.  Taking night-time cold medicine to help with sleep due to congestion, not sleeping well.  Uses humidifier daily.    The following portions of the patient's history were reviewed and updated as appropriate: allergies, current medications, past medical history, family and social history, and problem list. Patient is a nonsmoker.    PMH reviewed.  Past Medical History  Diagnosis Date  . Hypertension   . Hx of migraines   . IBS (irritable bowel syndrome)   . Diverticulitis with perforation 2010   Past Surgical History  Procedure Laterality Date  . Colostomy takedown    . Colostomy  2008  . Cesarean section  1983 and 1984  . Tonsillectomy and adenoidectomy  1997    Medications reviewed. Current Outpatient Prescriptions  Medication Sig Dispense Refill  . clobetasol cream (TEMOVATE) 0.05 % Apply topically 2 (two) times daily.      .  hydrochlorothiazide (HYDRODIURIL) 25 MG tablet Take 1 tablet (25 mg total) by mouth daily.  30 tablet  3  . traMADol (ULTRAM) 50 MG tablet Take 1 tablet (50 mg total) by mouth every 8 (eight) hours as needed.  30 tablet  1   No current facility-administered medications for this visit.     Objective:   Physical Exam BP 149/86  Pulse 73  Temp(Src) 98 F (36.7 C) (Oral)  Ht 5' 6.5" (1.689 m)  Wt 366 lb 6.4 oz (166.198 kg)  BMI 58.26 kg/m2 Gen:  Alert, cooperative patient who appears stated age in no acute distress.  Vital signs reviewed. HEENT: EOMI,  MMM.  Nasal turbinates boggy and edematous BL.  Cardiac:  Regular rate and rhythm  Pulm:  Clear to auscultation bilaterally  Abd:  Soft/nondistended/nontender.  Obese  Exts: Obese without any appreciable swelling.   MSK:  Obese legs BL.  TTP along medial joint line on left, not right.  No ligamentous laxity right or left.     No results found for this or any previous visit (from the past 72 hour(s)).    Subjective:     [name] is a 47 y.o. female and is here for a comprehensive physical exam. The patient reports see separate note for details. .  History   Social History  . Marital Status: Legally Separated    Spouse Name: N/A    Number of  Children: N/A  . Years of Education: N/A   Occupational History  . Not on file.   Social History Main Topics  . Smoking status: Never Smoker   . Smokeless tobacco: Not on file  . Alcohol Use: 0.0 oz/week     Comment: Maybe wine every 6 months  . Drug Use: No  . Sexual Activity: Yes     Comment: Told she could not have more children in 1986   Other Topics Concern  . 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   Married x 23 years, recently separated (4/15).  No domestic violence.  + financial stress.     Lives previously with husband who moved out, now  with daughter who has medical problems, including Hodgkin's disease, and granddaughter (age 93).     Uses seat belt.    Health Maintenance  Topic Date Due  . Pap Smear  06/09/2011  . Influenza Vaccine  11/05/2013  . Tetanus/tdap  07/12/2018    The following portions of the patient's history were reviewed and updated as appropriate: allergies, current medications, past family history, past medical history, past social history, past surgical history and problem list.  Review of Systems Pertinent items are noted in HPI.   Objective:    BP 149/86  Pulse 73  Temp(Src) 98 F (36.7 C) (Oral)  Ht 5' 6.5" (1.689 m)  Wt 366 lb 6.4 oz (166.198 kg)  BMI 58.26 kg/m2 General appearance: alert, cooperative and appears stated age Head: Normocephalic, without obvious abnormality, atraumatic Eyes: conjunctivae/corneas clear. PERRL, EOM's intact. Fundi benign. Lungs: clear to auscultation bilaterally Heart: regular rate and rhythm, S1, S2 normal, no murmur, click, rub or gallop Abdomen: soft, non-tender; bowel sounds normal; no masses,  no organomegaly Extremities: extremities normal, atraumatic, no cyanosis or edema Neurologic: Grossly normal    Assessment:    Healthy female exam.      Plan:     See After Visit Summary for Counseling Recommendations   Health maintenance counseling:  Anticipatory guidance:  Counseled on good nutrition including more fruits and vegetables in diet, regular dental exams, stress reduction techniques such as deep breathing, and walking 150 minutes per week.   Rsk factor reduction:  Patient advised to increase fruits and vegetables to help lose weight, and increase exercise to 150 minutes per week.    Immunizations/Screenings:  .  Recommended to receive flu shot during flu season.   Needs pap smear.  Will return for this next visit.

## 2013-07-27 ENCOUNTER — Ambulatory Visit
Admission: RE | Admit: 2013-07-27 | Discharge: 2013-07-27 | Disposition: A | Discharge status: Home / Self Care | Payer: BC Managed Care – PPO | Source: Ambulatory Visit | Attending: Family Medicine | Admitting: Family Medicine

## 2013-07-27 DIAGNOSIS — M25562 Pain in left knee: Principal | ICD-10-CM

## 2013-07-27 DIAGNOSIS — M25561 Pain in right knee: Secondary | ICD-10-CM

## 2013-07-28 ENCOUNTER — Telehealth: Payer: Self-pay | Admitting: Family Medicine

## 2013-07-28 NOTE — Telephone Encounter (Signed)
Called to discuss radiographs and LDL.  Normal radiographs will just some loss of joint space on Left, which is where she has pain.  Plan to try Tramadol for a few weeks and then attempt steroid injection (patient desires attempting injection).  Discussed LDL as well, she would like to work on cutting down cholesterol on her own through diet and exercise.  FU in 3-6 months for this.

## 2013-07-29 DIAGNOSIS — J302 Other seasonal allergic rhinitis: Secondary | ICD-10-CM | POA: Insufficient documentation

## 2013-07-29 NOTE — Assessment & Plan Note (Signed)
Encouraged to continue actively attempting weight loss.  Discussed healthy eating options.

## 2013-07-29 NOTE — Assessment & Plan Note (Signed)
Xray today.  Did not get these after prior OV. Likely mostly weight-related.  Tramadol for pain relief when exercising.  If no improvement can attempt knee injection.

## 2013-07-29 NOTE — Assessment & Plan Note (Signed)
Treat with OTC anti-histamines for now.

## 2013-07-29 NOTE — Assessment & Plan Note (Signed)
Continue current medications.  Will recheck in 1-2 months, may need to add another agent.

## 2013-08-04 ENCOUNTER — Encounter: Payer: Self-pay | Admitting: Family Medicine

## 2013-08-04 ENCOUNTER — Ambulatory Visit (INDEPENDENT_AMBULATORY_CARE_PROVIDER_SITE_OTHER): Payer: BC Managed Care – PPO | Admitting: Family Medicine

## 2013-08-04 VITALS — BP 113/77 | HR 69 | Temp 97.7°F | Ht 66.5 in | Wt 368.0 lb

## 2013-08-04 DIAGNOSIS — M25562 Pain in left knee: Secondary | ICD-10-CM

## 2013-08-04 DIAGNOSIS — M25569 Pain in unspecified knee: Secondary | ICD-10-CM

## 2013-08-04 MED ORDER — METHYLPREDNISOLONE ACETATE 40 MG/ML IJ SUSP
40.0000 mg | Freq: Once | INTRAMUSCULAR | Status: AC
  Administered 2013-08-04: 40 mg via INTRA_ARTICULAR

## 2013-08-04 NOTE — Assessment & Plan Note (Signed)
Pt states pain worse in her L knee, x-rays reviewed with some medial joint space narrowing and TTP.  Amenable to injection today.  Informed consent obtained and placed in chart.  Time out performed.  Area cleaned with iodine x 3 and wiped clear with alcohol swab.  Using 21 1/2 gauge needle 1 cc Depo 40 mg and 3 cc's 1% Lidocaine were injected in the superiorlateral aspect of the L knee after aspiration revealed straw colored fluid < 1 cc.  Sterile bandage placed.  Patient tolerated procedure well.  No complications.

## 2013-08-04 NOTE — Progress Notes (Signed)
Patient ID: [name], female   DOB: 1965/11/17, 48 y.o.   MRN: [id number] [name] is a 48 y.o. female who presents to [facility] today for Left knee pain and injection.   1.  Left knee pain: Previous fall w/o acute injury.  Having pain at the medial aspect of her knee joint.  Taking aleve which feels that this is not really helping anymore.  No fevers or chills.  No giving out, no locking or clicking.  Soreness is main issue.     The following portions of the patient's history were reviewed and updated as appropriate: allergies, current medications, past medical history, family and social history, and problem list.  Patient is a nonsmoker.  Past Medical History  Diagnosis Date  . Hypertension   . Hx of migraines   . IBS (irritable bowel syndrome)   . Diverticulitis with perforation 2010    ROS as above otherwise neg. No Chest pain, palpitations, SOB, Fever, Chills, Abd pain, N/V/D.  Medications reviewed. Current Outpatient Prescriptions  Medication Sig Dispense Refill  . clobetasol cream (TEMOVATE) 0.05 % Apply topically 2 (two) times daily.      . hydrochlorothiazide (HYDRODIURIL) 25 MG tablet Take 1 tablet (25 mg total) by mouth daily.  30 tablet  3  . traMADol (ULTRAM) 50 MG tablet Take 1 tablet (50 mg total) by mouth every 8 (eight) hours as needed.  30 tablet  1   No current facility-administered medications for this visit.    Exam:  BP 113/77  Pulse 69  Temp(Src) 97.7 F (36.5 C) (Oral)  Ht 5' 6.5" (1.689 m)  Wt 368 lb (166.924 kg)  BMI 58.51 kg/m2 Gen: Well NAD.  Obese.  Smiling and interactive.   Left Knee:  Body habitus obscures anatomic landmarks.  TTP medial compartment.  No bruising noted.  Difficult to feel for effusion.  Full active and passive ROM without pain or limitations.  No ligamentous laxity noted, ant/posterior drawer negative. Patient does ambulate with slight limp. Neuro:  Alert and oriented x 3.  No gross neurological deficits noted NV  intact LE B/L

## 2013-08-04 NOTE — Patient Instructions (Signed)
Knee Injection Joint injections are shots. Your caregiver will place a needle into your knee joint. The needle is used to put medicine into the joint. These shots can be used to help treat different painful knee conditions such as osteoarthritis, bursitis, local flare-ups of rheumatoid arthritis, and pseudogout. Anti-inflammatory medicines such as corticosteroids and anesthetics are the most common medicines used for joint and soft tissue injections.  PROCEDURE  The skin over the kneecap will be cleaned with an antiseptic solution.  Your caregiver will inject a small amount of a local anesthetic (a medicine like Novocaine) just under the skin in the area that was cleaned.  After the area becomes numb, a second injection is done. This second injection usually includes an anesthetic and an anti-inflammatory medicine called a steroid or cortisone. The needle is carefully placed in between the kneecap and the knee, and the medicine is injected into the joint space.  After the injection is done, the needle is removed. Your caregiver may place a bandage over the injection site. The whole procedure takes no more than a couple of minutes. BEFORE THE PROCEDURE  Wash all of the skin around the entire knee area. Try to remove any loose, scaling skin. There is no other specific preparation necessary unless advised otherwise by your caregiver. LET YOUR CAREGIVER KNOW ABOUT:   Allergies.  Medications taken including herbs, eye drops, over the counter medications, and creams.  Use of steroids (by mouth or creams).  Possible pregnancy, if applicable.  Previous problems with anesthetics or Novocaine.  History of blood clots (thrombophlebitis).  History of bleeding or blood problems.  Previous surgery.  Other health problems. RISKS AND COMPLICATIONS Side effects from cortisone shots are rare. They include:   Slight bruising of the skin.  Shrinkage of the normal fatty tissue under the skin where  the shot was given.  Increase in pain after the shot.  Infection.  Weakening of tendons or tendon rupture.  Allergic reaction to the medicine.  Diabetics may have a temporary increase in their blood sugar after a shot.  Cortisone can temporarily weaken the immune system. While receiving these shots, you should not get certain vaccines. Also, avoid contact with anyone who has chickenpox or measles. Especially if you have never had these diseases or have not been previously immunized. Your immune system may not be strong enough to fight off the infection while the cortisone is in your system. AFTER THE PROCEDURE   You can go home after the procedure.  You may need to put ice on the joint 15-20 minutes every 3 or 4 hours until the pain goes away.  You may need to put an elastic bandage on the joint. HOME CARE INSTRUCTIONS   Only take over-the-counter or prescription medicines for pain, discomfort, or fever as directed by your caregiver.  You should avoid stressing the joint. Unless advised otherwise, avoid activities that put a lot of pressure on a knee joint, such as:  Jogging.  Bicycling.  Recreational climbing.  Hiking.  Laying down and elevating the leg/knee above the level of your heart can help to minimize swelling. SEEK MEDICAL CARE IF:   You have repeated or worsening swelling.  There is drainage from the puncture area.  You develop red streaking that extends above or below the site where the needle was inserted. SEEK IMMEDIATE MEDICAL CARE IF:   You develop a fever.  You have pain that gets worse even though you are taking pain medicine.  The area is   red and warm, and you have trouble moving the joint. MAKE SURE YOU:   Understand these instructions.  Will watch your condition.  Will get help right away if you are not doing well or get worse. Document Released: 06/15/2006 Document Revised: 06/16/2011 Document Reviewed: 03/12/2007 [employer] Patient  Information 2014 [employer].  

## 2013-09-29 ENCOUNTER — Other Ambulatory Visit (HOSPITAL_COMMUNITY)
Admission: RE | Admit: 2013-09-29 | Discharge: 2013-09-29 | Disposition: A | Discharge status: Home / Self Care | Payer: BC Managed Care – PPO | Source: Ambulatory Visit | Attending: Family Medicine | Admitting: Family Medicine

## 2013-09-29 ENCOUNTER — Ambulatory Visit (INDEPENDENT_AMBULATORY_CARE_PROVIDER_SITE_OTHER): Payer: BC Managed Care – PPO | Admitting: Family Medicine

## 2013-09-29 ENCOUNTER — Encounter: Payer: Self-pay | Admitting: Family Medicine

## 2013-09-29 VITALS — BP 147/83 | HR 71 | Ht 66.5 in | Wt 370.0 lb

## 2013-09-29 DIAGNOSIS — N912 Amenorrhea, unspecified: Secondary | ICD-10-CM

## 2013-09-29 DIAGNOSIS — B9689 Other specified bacterial agents as the cause of diseases classified elsewhere: Secondary | ICD-10-CM

## 2013-09-29 DIAGNOSIS — A499 Bacterial infection, unspecified: Secondary | ICD-10-CM

## 2013-09-29 DIAGNOSIS — Z113 Encounter for screening for infections with a predominantly sexual mode of transmission: Secondary | ICD-10-CM | POA: Insufficient documentation

## 2013-09-29 DIAGNOSIS — B3731 Acute candidiasis of vulva and vagina: Secondary | ICD-10-CM

## 2013-09-29 DIAGNOSIS — N898 Other specified noninflammatory disorders of vagina: Secondary | ICD-10-CM

## 2013-09-29 DIAGNOSIS — Z202 Contact with and (suspected) exposure to infections with a predominantly sexual mode of transmission: Secondary | ICD-10-CM

## 2013-09-29 DIAGNOSIS — N760 Acute vaginitis: Secondary | ICD-10-CM

## 2013-09-29 DIAGNOSIS — B373 Candidiasis of vulva and vagina: Secondary | ICD-10-CM

## 2013-09-29 LAB — POCT WET PREP (WET MOUNT): CLUE CELLS WET PREP WHIFF POC: POSITIVE

## 2013-09-29 LAB — HIV ANTIBODY (ROUTINE TESTING W REFLEX): HIV: NONREACTIVE

## 2013-09-29 LAB — RPR

## 2013-09-29 LAB — POCT URINE PREGNANCY: PREG TEST UR: NEGATIVE

## 2013-09-29 MED ORDER — METRONIDAZOLE 500 MG PO TABS: 500.0000 mg | ORAL_TABLET | Freq: Three times a day (TID) | ORAL | Status: DC

## 2013-09-29 MED ORDER — FLUCONAZOLE 150 MG PO TABS
150.0000 mg | ORAL_TABLET | Freq: Once | ORAL | Status: DC
Start: 2013-09-29 — End: 2014-12-12

## 2013-09-29 NOTE — Patient Instructions (Addendum)
Thank you for coming in, today!  I think you have bacterial vaginosis (BV) and a yeast infection, which are NOT sexually transmitted infection. I will treat you with metronidazole (Flagyl) 500 mg three times a day for 10 days, and fluconazole (Diflucan) 150 mg once. The metronidazole will treat the BV, and the fluconazole will treat the yeast. DO NOT drink alcohol while taking metronidazole, or for a couple of days after you finish it. If you drink alcohol with metronidazole, it can make you have very uncomfortable GI upset, nausea, and vomiting.  Your pregnancy test was negative. If you continue to have irregular periods, talk to your regular doctor or your OBGYN if you have one. I will call you or send you a letter with your blood tests and the tests for gonorrhea and Chlamydia.  Come back to see Dr. Mingo Amber as you need. Please feel free to call with any questions or concerns at any time, at [phone]. --Dr. Venetia Maxon

## 2013-09-29 NOTE — Progress Notes (Signed)
   Subjective:    Patient ID: [name], female    DOB: [birth year], 48 y.o.   MRN: [id number]  HPI: Pt presents to clinic for same day appointment for vaginal discharge and no cycle for two months. Vaginal discharge is described as brown, similar to dried blood; amount is described as "light," not like her usual menses. She has no pain or odor and has no urinary complaints. She states she thought she might have a yeast infection and took Monistat about 1 week ago, which may have helped "but only a little," and her discharge is persistent. She states she is almost always regular in her menses, usually with onset between the 21st and the 25th of the month. Her last intercourse was about 1 month ago, with her husband; they are now separated as he had an affair. She states she may have had an STI in the remote past, "maybe Chlamydia or something around age 75." She requests check for STI's.  Review of Systems: As above. Denies belly pain, fevers / chills, N/V, change in stool. Denies groin or other skin changes / rashes / outbreaks.     Objective:   Physical Exam BP 147/83  Pulse 71  Ht 5' 6.5" (1.689 m)  Wt 370 lb (167.831 kg)  BMI 58.83 kg/m2  LMP 07/26/2013 Gen: well-appearing adult female in NAD HEENT: [place]/AT, EOMI, PERRLA Cardio: RRR, no murmur Pulm: CTAB, no wheezes Abd: soft, nontender, but exam-limiting morbid obesity; BS+ GU: very difficult speculum and digital exam secondary to morbid obesity; no obvious / gross external vaginal / vulvar lesions  Speculum:    cervix not well-visualized, but moderate amount of whitish-gray thick discharge present in vaginal vault   No obvious / active bleeding or vaginal wall abnormalities  Bimanual:   Limited by obesity, but no CMT or adnexal masses / tenderness   No definite uterine fundal tenderness; unable to accurately estimate uterine size     Assessment & Plan:  1.   Vaginal discharge - BV suggested by wet prep findings and yeast  vaginitis based on symptoms / appearance on exam 2.   Amenorrhea - present for about 2 months, with pregnancy test negative; possible early menopause vs irregular menses 3.   Possible exposure to STD - recent sexual intercourse with husband who has been having intercourse with an unknown partner, with concurrent vaginal discharge; pt requests testing  Plan: - Rx for Flagyl and Diflucan for BV and yeast vaginitis; reviewed NO EtOH use while on Flagyl - GC / Chlamydia cervical sample and HIV and RPR blood samples taken, today; will f/u by letter or phone call as appropriate - reviewed red flags that would prompt immediate return to care (worse symptoms, heavy bleeding, onset of high fevers, abdominal pain, N/V, etc) - f/u with PCP as needed - advised f/u with PCP or OBGYN specifically for amenorrhea or if irregular bleeding continues / develops  Emmaline Kluver, MD PGY-2, [facility] 09/29/2013, 12:52 PM

## 2013-10-03 ENCOUNTER — Encounter: Payer: Self-pay | Admitting: Family Medicine

## 2013-10-14 ENCOUNTER — Ambulatory Visit: Payer: BC Managed Care – PPO | Admitting: Family Medicine

## 2013-10-28 ENCOUNTER — Ambulatory Visit (INDEPENDENT_AMBULATORY_CARE_PROVIDER_SITE_OTHER): Payer: BC Managed Care – PPO | Admitting: Family Medicine

## 2013-10-28 ENCOUNTER — Encounter: Payer: Self-pay | Admitting: Family Medicine

## 2013-10-28 VITALS — BP 121/71 | HR 68 | Temp 98.3°F | Ht 65.5 in | Wt 373.7 lb

## 2013-10-28 DIAGNOSIS — R21 Rash and other nonspecific skin eruption: Secondary | ICD-10-CM

## 2013-10-28 NOTE — Progress Notes (Signed)
Patient ID: JOSLYN RAMOS, female   DOB: [birth year], 48 y.o.   MRN: [id number] Subjective:   CC: Rash on arms  HPI:   This is a 48 y.o. female with h/o obesity, HTN, and seasonal allergies presenting with bilateral arm rash.   Had rash for 2 days. Location: Started on left elbow and has seemed to spread up left dorsal forearm and to right elbow Medications tried: benadryl, which helped with itching Similar rash in past: no New medications or antibiotics: none Tick, Insect or new pet exposure: Thinks bitten by mosquito on elbow. 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 She also thinks she may have seen small insects in her car after it rained this week. Recent travel: No New detergent or soap: No Immunocompromised: No  Symptoms Itching: Yes Pain over rash: No Feeling ill all over: No Fever: No Mouth sores: No Face or tongue swelling: no Trouble breathing: no Joint swelling or pain: no  Review of Symptoms - see HPI. She does not share a bed with anyone but lives with dtr and granddaughter who do not have similar symptoms. Denies palmar/soles of feet involvement.  PMH - HTN, obesity, seasonal allergies, h/o "sensitive skin and eczema"  Review of Systems - Per HPI.     Objective:  Physical Exam LMP 07/26/2013 BP 121/71  Pulse 68  Temp(Src) 98.3 F (36.8 C) (Oral)  Ht 5' 5.5" (1.664 m)  Wt 373 lb 11.2 oz (169.509 kg)  BMI 61.22 kg/m2  LMP 10/26/2013  GEN: NAD, pleasant SKIN: Maculopapular rash along forearms bilaterally with minimal surrounding erythema and no induration, punctate scab at center of each papule, no purulence or warmth No joint swelling No rash elsewhere HEENT: O/p clear    Assessment:     UNIKA NAZARENO is a 48 y.o. female here for new rash on bilateral arms.    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 Fleas seem most likely. I have considered and concluded the patient has a very low likelihood of having an acute infection (scarlet fever, acute HIV, meningococcemia; toxic shock),  a tick borne illness (RMSF, Lyme): a drug reaction, mycosis fungoides, or secondary syphilis given rash is acute and she is well-appearing with normal vitals. - OTC hydrocortisone 1% to affected areas for 1 week. - Return in 1 week if not improving or sooner if spreading or develops systemic symptoms. - Vacuum car and wash clothes in hot water with soap.   Hilton Sinclair, MD [place]

## 2013-10-28 NOTE — Patient Instructions (Signed)
This is most likely bug bites. Because the rash is closely spaced, it seems like fleas. Wash all clothes with soap and hot water. Vacuum out your car. If you notice any bugs in your bed, this could be bedbugs but that seems unlikely by what you told me. You can use 1% hydrocortisone cream to help with itching for 5-7 days if needed. If you get fevers, chills, spreading of the rash, pus, or it starts to look different, please follow up for re-evaluation.  Insect Bite Mosquitoes, flies, fleas, bedbugs, and other insects can bite. Insect bites are different from insect stings. The bite may be red, puffy (swollen), and itchy for 2 to 4 days. Most bites get better on their own. HOME CARE   Do not scratch the bite.  Keep the bite clean and dry. Wash the bite with soap and water.  Put ice on the bite.  Put ice in a plastic bag.  Place a towel between your skin and the bag.  Leave the ice on for 20 minutes, 4 times a day. Do this for the first 2 to 3 days, or as told by your doctor.  You may use medicated lotions or creams to lessen itching as told by your doctor.  Only take medicines as told by your doctor.  If you are given medicines (antibiotics), take them as told. Finish them even if you start to feel better. You may need a tetanus shot if:  You cannot remember when you had your last tetanus shot.  You have never had a tetanus shot.  The injury broke your skin. If you need a tetanus shot and you choose not to have one, you may get tetanus. Sickness from tetanus can be serious. GET HELP RIGHT AWAY IF:   You have more pain, redness, or puffiness.  You see a red line on the skin coming from the bite.  You have a fever.  You have joint pain.  You have a headache or neck pain.  You feel weak.  You have a rash.  You have chest pain, or you are short of breath.  You have belly (abdominal) pain.  You feel sick to your stomach (nauseous) or throw up (vomit).  You feel  very tired or sleepy. MAKE SURE YOU:   Understand these instructions.  Will watch your condition.  Will get help right away if you are not doing well or get worse. Document Released: 03/21/2000 Document Revised: 06/16/2011 Document Reviewed: 10/23/2010 [facility] Patient Information 2015 [place], [place]. This information is not intended to replace advice given to you by your health care provider. Make sure you discuss any questions you have with your health care provider.

## 2013-10-29 DIAGNOSIS — R21 Rash and other nonspecific skin eruption: Secondary | ICD-10-CM | POA: Insufficient documentation

## 2013-10-29 NOTE — Assessment & Plan Note (Signed)
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 Fleas seem most likely. I have considered and concluded the patient has a very low likelihood of having an acute infection (scarlet fever, acute HIV, meningococcemia; toxic shock),  a tick borne illness (RMSF, Lyme): a drug reaction, mycosis fungoides, or secondary syphilis given rash is acute and she is well-appearing with normal vitals. - OTC hydrocortisone 1% to affected areas for 1 week. - Return in 1 week if not improving or sooner if spreading or develops systemic symptoms. - Vacuum car and wash clothes in hot water with soap.

## 2014-11-17 ENCOUNTER — Ambulatory Visit (INDEPENDENT_AMBULATORY_CARE_PROVIDER_SITE_OTHER): Payer: 59 | Admitting: Family Medicine

## 2014-11-17 VITALS — BP 152/82 | HR 91 | Temp 98.7°F | Wt 376.8 lb

## 2014-11-17 DIAGNOSIS — N390 Urinary tract infection, site not specified: Secondary | ICD-10-CM

## 2014-11-17 LAB — POCT URINE PREGNANCY: Preg Test, Ur: NEGATIVE

## 2014-11-17 LAB — POCT URINALYSIS DIPSTICK
Glucose, UA: NEGATIVE
Ketones, UA: 15
Leukocytes, UA: NEGATIVE
Nitrite, UA: NEGATIVE
SPEC GRAV UA: 1.025
Urobilinogen, UA: 0.2
pH, UA: 5.5

## 2014-11-17 MED ORDER — CEPHALEXIN 500 MG PO CAPS: 500.0000 mg | ORAL_CAPSULE | Freq: Two times a day (BID) | ORAL | Status: AC

## 2014-11-17 NOTE — Patient Instructions (Signed)
Thank you for coming to see me today. It was a pleasure. Today we talked about:   UTI: Take the antibiotic for 7 days. If worsening, please return  If you have any questions or concerns, please do not hesitate to call the office at [phone].  Sincerely,  Cordelia Poche, MD

## 2014-11-17 NOTE — Progress Notes (Signed)
    Subjective   [name] is a 49 y.o. female that presents for a same day visit  1. Diarrhea/fever: Symptoms started about 5 days ago. She had diarrhea which has now resolved and a fever with a tmax to 101 three days ago that resolved. Now she is having pressure pains in her lower abdomen when she is urinating. She was previously having frequency which seems to have resolved. No urgency. No nausea or vomiting.  ROS Per HPI  Social History  Substance Use Topics  . Smoking status: Never Smoker   . Smokeless tobacco: Not on file  . Alcohol Use: 0.0 oz/week     Comment: Maybe wine every 6 months    Allergies  Allergen Reactions  . Penicillins Rash    Objective   BP 152/82 mmHg  Pulse 91  Temp(Src) 98.7 F (37.1 C) (Oral)  Wt 376 lb 12.8 oz (170.915 kg)  LMP 10/16/2013 (Approximate)  General: Well appearing, no distress Genitourinary: Suprapubic tenderness  Assessment and Plan   No orders of the defined types were placed in this encounter.    Urinary tract infection: possible with recent diarrheal illness. Although she has a history of fever, doubt this is pyelonephritis.  Keflex 500mg  BID x7 days

## 2014-11-28 ENCOUNTER — Encounter: Payer: Self-pay | Admitting: Family Medicine

## 2014-11-28 ENCOUNTER — Ambulatory Visit (INDEPENDENT_AMBULATORY_CARE_PROVIDER_SITE_OTHER): Payer: 59 | Admitting: Family Medicine

## 2014-11-28 VITALS — BP 147/74 | HR 78 | Temp 97.9°F | Ht 65.5 in | Wt 381.5 lb

## 2014-11-28 DIAGNOSIS — R1032 Left lower quadrant pain: Secondary | ICD-10-CM | POA: Diagnosis not present

## 2014-11-28 NOTE — Assessment & Plan Note (Signed)
Likely ovarian cyst enlargement. U/S ordered to assess. If normal, with no improvement of her symptoms, I recommended f/u with PCP for GI study ( CT or colonoscopy referral) due to hx of perforated diverticulitis. Return precaution discussed.

## 2014-11-28 NOTE — Progress Notes (Signed)
Subjective:     Patient ID: [name], female   DOB: 1965/07/06, 49 y.o.   MRN: [id number]  Abdominal Pain Chronicity: Left pelvic pain for about 2 weeks. The problem occurs intermittently (She was fine all day yesterday till later that night). The problem has been unchanged. The pain is located in the LLQ. The pain is at a severity of 6/10 (Currently she is asymptomatic). The quality of the pain is dull (Feels like a discomfort). The abdominal pain does not radiate. Pertinent negatives include no anorexia, constipation, diarrhea, dysuria, fever, hematochezia, hematuria, nausea or vomiting. Nothing aggravates the pain. Relieved by: Vernell Morgans makes it better. The treatment provided moderate relief. Her past medical history is significant for irritable bowel syndrome. Colostomy bag for two years, which was removed 2010, she stated she got it due to hx of diverticulitis with perforation. Colostomy bag was placed in 2008.     Current Outpatient Prescriptions on File Prior to Visit  Medication Sig Dispense Refill  . clobetasol cream (TEMOVATE) 0.05 % Apply topically 2 (two) times daily.    . fluconazole (DIFLUCAN) 150 MG tablet Take 1 tablet (150 mg total) by mouth once. 1 tablet 0  . hydrochlorothiazide (HYDRODIURIL) 25 MG tablet Take 1 tablet (25 mg total) by mouth daily. 30 tablet 3  . metroNIDAZOLE (FLAGYL) 500 MG tablet Take 1 tablet (500 mg total) by mouth 3 (three) times daily. 21 tablet 0  . traMADol (ULTRAM) 50 MG tablet Take 1 tablet (50 mg total) by mouth every 8 (eight) hours as needed. 30 tablet 1   No current facility-administered medications on file prior to visit.   Past Medical History  Diagnosis Date  . Hypertension   . Hx of migraines   . IBS (irritable bowel syndrome)   . Diverticulitis with perforation 2010     Review of Systems  Constitutional: Negative for fever.  Respiratory: Negative.   Cardiovascular: Negative.   Gastrointestinal: Positive for abdominal  pain. Negative for nausea, vomiting, diarrhea, constipation, hematochezia and anorexia.  Genitourinary: Negative.  Negative for dysuria and hematuria.  All other systems reviewed and are negative.      Objective:   Physical Exam  Constitutional: She appears well-developed. No distress.  Cardiovascular: Normal rate, regular rhythm, normal heart sounds and intact distal pulses.   No murmur heard. Pulmonary/Chest: Effort normal and breath sounds normal. No respiratory distress. She has no wheezes.  Abdominal: Soft. Bowel sounds are normal. She exhibits no mass. There is no hepatosplenomegaly. There is tenderness in the left lower quadrant. There is no rigidity, no guarding, no tenderness at McBurney's point and negative Murphy's sign.  Nursing note and vitals reviewed.      Assessment:     LLQ abdominal pain     Plan:     Check problem list.

## 2014-11-28 NOTE — Patient Instructions (Signed)
It was nice seeing you [name], I am sorry about your abdominal pain. It could be due to an ovarian cyst. We will get an Ultrasound to assess your ovaries. If result is normal, please see Dr Mingo Amber soon for possible CT abdomen.

## 2014-11-30 ENCOUNTER — Encounter: Payer: Self-pay | Admitting: Family Medicine

## 2014-11-30 ENCOUNTER — Other Ambulatory Visit: Payer: Self-pay | Admitting: Family Medicine

## 2014-11-30 ENCOUNTER — Telehealth: Payer: Self-pay | Admitting: Family Medicine

## 2014-11-30 ENCOUNTER — Ambulatory Visit (HOSPITAL_COMMUNITY)
Admission: RE | Admit: 2014-11-30 | Discharge: 2014-11-30 | Disposition: A | Discharge status: Home / Self Care | Payer: 59 | Source: Ambulatory Visit | Attending: Family Medicine | Admitting: Family Medicine

## 2014-11-30 DIAGNOSIS — R1032 Left lower quadrant pain: Secondary | ICD-10-CM | POA: Diagnosis present

## 2014-11-30 MED ORDER — IBUPROFEN 400 MG PO TABS: 400.0000 mg | ORAL_TABLET | Freq: Four times a day (QID) | ORAL | Status: AC | PRN

## 2014-11-30 NOTE — Telephone Encounter (Signed)
I can escribe Ibuprofen 400 mg to her pharm, otherwise see PCP soon.

## 2014-11-30 NOTE — Telephone Encounter (Signed)
Tried to call patient again but number isn't working.  Please get a good number for me to call her back at.  [facility]

## 2014-11-30 NOTE — Telephone Encounter (Signed)
Please call patient back

## 2014-11-30 NOTE — Telephone Encounter (Signed)
Tried to call patient back regarding medication but was unable to reach her.  Confirmed patient's number with husband but operator, stated "this call can not be completed as dialed".  Will inform her of ibuprofen option when she calls back. Gareld Obrecht,CMA

## 2014-11-30 NOTE — Telephone Encounter (Signed)
I tried to reach patient multiple times to discuss her pelvic U/S report but her form line was busy all the time. I left a message with her husband for her to call back..   When she calls back please discuss the following: 1. Pelvic U/S shows ovarian cyst and fibroid. 2. Follow-up with Mingo Amber as scheduled to discuss further management especially if she is still symptomatic.

## 2014-11-30 NOTE — Telephone Encounter (Signed)
Spoke with patient and gave her results from ultrasound.  Patient is already scheduled to see Dr. Mingo Amber on 12-12-14 but would like to know what she can do the pain until then.  She takes aleve now with some relief but states that pain is worse at night when she lays down.  Please advise. [name],CMA

## 2014-12-12 ENCOUNTER — Ambulatory Visit (INDEPENDENT_AMBULATORY_CARE_PROVIDER_SITE_OTHER): Payer: 59 | Admitting: Family Medicine

## 2014-12-12 ENCOUNTER — Encounter: Payer: Self-pay | Admitting: Family Medicine

## 2014-12-12 ENCOUNTER — Other Ambulatory Visit (HOSPITAL_COMMUNITY)
Admission: RE | Admit: 2014-12-12 | Discharge: 2014-12-12 | Disposition: A | Discharge status: Home / Self Care | Payer: 59 | Source: Ambulatory Visit | Attending: Family Medicine | Admitting: Family Medicine

## 2014-12-12 VITALS — BP 146/69 | HR 74 | Temp 98.1°F | Ht 65.5 in | Wt 383.2 lb

## 2014-12-12 DIAGNOSIS — Z0000 Encounter for general adult medical examination without abnormal findings: Secondary | ICD-10-CM

## 2014-12-12 DIAGNOSIS — Z124 Encounter for screening for malignant neoplasm of cervix: Secondary | ICD-10-CM

## 2014-12-12 DIAGNOSIS — I10 Essential (primary) hypertension: Secondary | ICD-10-CM | POA: Diagnosis not present

## 2014-12-12 DIAGNOSIS — Z638 Other specified problems related to primary support group: Secondary | ICD-10-CM

## 2014-12-12 DIAGNOSIS — Z01419 Encounter for gynecological examination (general) (routine) without abnormal findings: Secondary | ICD-10-CM | POA: Insufficient documentation

## 2014-12-12 DIAGNOSIS — Z1151 Encounter for screening for human papillomavirus (HPV): Secondary | ICD-10-CM | POA: Diagnosis present

## 2014-12-12 DIAGNOSIS — R1032 Left lower quadrant pain: Secondary | ICD-10-CM

## 2014-12-12 DIAGNOSIS — Z23 Encounter for immunization: Secondary | ICD-10-CM | POA: Diagnosis not present

## 2014-12-12 DIAGNOSIS — E669 Obesity, unspecified: Secondary | ICD-10-CM

## 2014-12-12 DIAGNOSIS — F439 Reaction to severe stress, unspecified: Secondary | ICD-10-CM

## 2014-12-12 LAB — CBC
HCT: 38.2 % (ref 36.0–46.0)
HEMOGLOBIN: 12.7 g/dL (ref 12.0–15.0)
MCH: 28.7 pg (ref 26.0–34.0)
MCHC: 33.2 g/dL (ref 30.0–36.0)
MCV: 86.4 fL (ref 78.0–100.0)
MPV: 9.8 fL (ref 8.6–12.4)
Platelets: 295 10*3/uL (ref 150–400)
RBC: 4.42 MIL/uL (ref 3.87–5.11)
RDW: 13.5 % (ref 11.5–15.5)
WBC: 8.1 10*3/uL (ref 4.0–10.5)

## 2014-12-12 LAB — COMPREHENSIVE METABOLIC PANEL
ALK PHOS: 66 U/L (ref 33–130)
ALT: 8 U/L (ref 6–29)
AST: 15 U/L (ref 10–35)
Albumin: 3.6 g/dL (ref 3.6–5.1)
BUN: 22 mg/dL (ref 7–25)
CO2: 27 mmol/L (ref 20–31)
Calcium: 10 mg/dL (ref 8.6–10.4)
Chloride: 101 mmol/L (ref 98–110)
Creat: 0.82 mg/dL (ref 0.50–1.05)
GLUCOSE: 102 mg/dL — AB (ref 65–99)
POTASSIUM: 4.7 mmol/L (ref 3.5–5.3)
Sodium: 142 mmol/L (ref 135–146)
Total Bilirubin: 0.4 mg/dL (ref 0.2–1.2)
Total Protein: 6.9 g/dL (ref 6.1–8.1)

## 2014-12-12 LAB — LIPID PANEL
CHOL/HDL RATIO: 4.9 ratio (ref <=5.0)
Cholesterol: 252 mg/dL — ABNORMAL HIGH (ref 125–200)
HDL: 51 mg/dL (ref >=46)
LDL Cholesterol: 179 mg/dL — ABNORMAL HIGH (ref <130)
Triglycerides: 109 mg/dL (ref <150)
VLDL: 22 mg/dL (ref <30)

## 2014-12-12 LAB — TSH: TSH: 2.461 u[IU]/mL (ref 0.350–4.500)

## 2014-12-12 MED ORDER — NAPROXEN 500 MG PO TABS: 500.0000 mg | ORAL_TABLET | Freq: Two times a day (BID) | ORAL | Status: DC

## 2014-12-12 MED ORDER — CLOBETASOL PROPIONATE 0.05 % EX CREA: TOPICAL_CREAM | Freq: Two times a day (BID) | CUTANEOUS | Status: DC

## 2014-12-12 NOTE — Progress Notes (Signed)
Subjective:    [name] is a 49 y.o. female who presents to [facility] today for several concerns:  Overall, her main concerns revolve around her daughter.  She is very concerned for her daughter's care of herself, or lack thereof.  (Daughter is also my patient.)  She spent most of the time discussing this.  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  It has affected her stress levels, but denies depression or mood disorders.  For her other medical issues:  1.  LLQ pain:  Has had pelvic ultrasound which showed pedunculated fibroid plus cyst Left Lower quadrant. Likely corpus luteal cyst.  Still with pain on most days, describes as menstrual cramps but not during her menses.  Last menstrual period was over a year ago.  Has symptoms of menopause.   2.  Preventative health:  Desires pap smear today.  Has been "many years" since last.  Also needs mammogram and colonoscopy.  Focus on mammogram today.    ROS as above per HPI, otherwise neg.  Pertinently, no chest pain, palpitations, SOB, Fever, Chills, N/V/D.   The following portions of the patient's history were reviewed and updated as appropriate: allergies, current medications, past medical history, family and social history, and problem list. Patient is a nonsmoker.    PMH reviewed.  Past Medical History  Diagnosis Date  . Hypertension   . Hx of migraines   . IBS (irritable bowel syndrome)   . Diverticulitis with perforation 2010   Past Surgical History  Procedure Laterality Date  . Colostomy takedown    . Colostomy  2008  . Cesarean section  1983 and 1984  . Tonsillectomy and adenoidectomy  1997    Medications reviewed. Current Outpatient Prescriptions  Medication Sig Dispense Refill  . clobetasol cream (TEMOVATE) 0.05 % Apply topically 2 (two) times daily.    . fluconazole (DIFLUCAN) 150 MG tablet Take 1 tablet (150 mg total) by mouth once. 1 tablet 0  . hydrochlorothiazide (HYDRODIURIL) 25 MG tablet  Take 1 tablet (25 mg total) by mouth daily. 30 tablet 3  . metroNIDAZOLE (FLAGYL) 500 MG tablet Take 1 tablet (500 mg total) by mouth 3 (three) times daily. 21 tablet 0  . traMADol (ULTRAM) 50 MG tablet Take 1 tablet (50 mg total) by mouth every 8 (eight) hours as needed. 30 tablet 1   No current facility-administered medications for this visit.     Objective:   Physical Exam BP 146/69 mmHg  Pulse 74  Temp(Src) 98.1 F (36.7 C) (Oral)  Ht 5' 5.5" (1.664 m)  Wt 383 lb 3.2 oz (173.818 kg)  BMI 62.78 kg/m2  LMP 10/16/2013 (Approximate) Gen:  Alert, cooperative patient who appears stated age in no acute distress.  Vital signs reviewed. HEENT: EOMI,  MMM Cardiac:  Regular rate and rhythm without murmur auscultated.  Good S1/S2. Pulm:  Clear to auscultation bilaterally with good air movement.  No wheezes or rales noted.   Abd:  Soft/nondistended/nontender.  Good bowel sounds throughout all four quadrants.  No masses noted.  Exts: Non edematous BL  LE, warm and well perfused.   No results found for this or any previous visit (from the past 72 hour(s)).

## 2014-12-12 NOTE — Patient Instructions (Signed)
Come back to see me in 6- 8 weeks and we'll repeat an ultrasound at that point.  If the pain gets way worse, call me and we won't wait.  Take the Naprosyn for pain relief, 1-2 times a day.   Checking labs today.  Call to schedule a mammogram.  It was good to see you again today!

## 2014-12-14 DIAGNOSIS — F439 Reaction to severe stress, unspecified: Secondary | ICD-10-CM | POA: Insufficient documentation

## 2014-12-14 LAB — CYTOLOGY - PAP

## 2014-12-14 NOTE — Assessment & Plan Note (Signed)
With both cyst and fibroids.  More likely luteal cyst as reason for pain. After discussion, she would like to wait 6-8 weeks to see if this resolves.   Naprosyn for pain relief in meantime.  FU at that time.  If resolved, hold off on further workup.  If persists, repeat U/S to see if cyst has resolved or enlarged.

## 2014-12-14 NOTE — Assessment & Plan Note (Signed)
Pap completed.   Mammogram information provided.  Checking lipid panel.

## 2014-12-14 NOTE — Assessment & Plan Note (Signed)
Affecting her some, but not overwhelming at this point. Discussed that I will touch base with her daughter to address some of her issues.  Did not discuss any explicit issues with mom due to patient confidentiality issues.

## 2014-12-14 NOTE — Assessment & Plan Note (Signed)
Elevated today.  She has been off her HCTZ for several months. Would like trial of weight loss and nutritional advice before restarting.  Also increasing her activity levels.

## 2014-12-14 NOTE — Assessment & Plan Note (Signed)
Another main issue for her.   She would like to schedule with Dr. Jenne Campus for better nutritional advice. Did not have much time to address this with her today.

## 2014-12-15 ENCOUNTER — Telehealth: Payer: Self-pay | Admitting: Family Medicine

## 2014-12-15 NOTE — Telephone Encounter (Signed)
Called [facility].  Discussed normal pap and normal labs except for elevated cholesterol.  After discussion, she would like to hold off on treatment until after meeting with Dr. Jenne Campus and trying different diet/activity plans.  Will see her back in several months, recheck, then have another conversation about statins.  Appreciative of call.  Did state that I've attempted to contact her daughter several times but unable to get through.

## 2015-01-30 ENCOUNTER — Ambulatory Visit (INDEPENDENT_AMBULATORY_CARE_PROVIDER_SITE_OTHER): Payer: 59 | Admitting: Family Medicine

## 2015-01-30 ENCOUNTER — Encounter (HOSPITAL_COMMUNITY): Payer: Self-pay

## 2015-01-30 ENCOUNTER — Telehealth: Payer: Self-pay | Admitting: Family Medicine

## 2015-01-30 ENCOUNTER — Ambulatory Visit (HOSPITAL_COMMUNITY)
Admission: RE | Admit: 2015-01-30 | Discharge: 2015-01-30 | Disposition: A | Discharge status: Home / Self Care | Payer: 59 | Source: Ambulatory Visit | Attending: Family Medicine | Admitting: Family Medicine

## 2015-01-30 ENCOUNTER — Other Ambulatory Visit: Payer: Self-pay | Admitting: Family Medicine

## 2015-01-30 VITALS — BP 132/82 | Temp 98.4°F | Wt 377.8 lb

## 2015-01-30 DIAGNOSIS — R1013 Epigastric pain: Secondary | ICD-10-CM | POA: Diagnosis not present

## 2015-01-30 DIAGNOSIS — R509 Fever, unspecified: Secondary | ICD-10-CM | POA: Insufficient documentation

## 2015-01-30 DIAGNOSIS — R1031 Right lower quadrant pain: Secondary | ICD-10-CM

## 2015-01-30 DIAGNOSIS — R590 Localized enlarged lymph nodes: Secondary | ICD-10-CM | POA: Insufficient documentation

## 2015-01-30 DIAGNOSIS — R1011 Right upper quadrant pain: Secondary | ICD-10-CM

## 2015-01-30 DIAGNOSIS — D72829 Elevated white blood cell count, unspecified: Secondary | ICD-10-CM | POA: Diagnosis not present

## 2015-01-30 DIAGNOSIS — R1909 Other intra-abdominal and pelvic swelling, mass and lump: Secondary | ICD-10-CM | POA: Diagnosis not present

## 2015-01-30 DIAGNOSIS — N9489 Other specified conditions associated with female genital organs and menstrual cycle: Secondary | ICD-10-CM

## 2015-01-30 DIAGNOSIS — R112 Nausea with vomiting, unspecified: Secondary | ICD-10-CM | POA: Diagnosis not present

## 2015-01-30 LAB — COMPREHENSIVE METABOLIC PANEL
ALBUMIN: 3 g/dL — AB (ref 3.5–5.0)
ALK PHOS: 83 U/L (ref 38–126)
ALT: 13 U/L — AB (ref 14–54)
ANION GAP: 9 (ref 5–15)
AST: 24 U/L (ref 15–41)
BUN: 14 mg/dL (ref 6–20)
CALCIUM: 9.5 mg/dL (ref 8.9–10.3)
CHLORIDE: 101 mmol/L (ref 101–111)
CO2: 27 mmol/L (ref 22–32)
CREATININE: 1.16 mg/dL — AB (ref 0.44–1.00)
GFR calc non Af Amer: 54 mL/min — ABNORMAL LOW (ref >60)
GLUCOSE: 129 mg/dL — AB (ref 65–99)
Potassium: 4.4 mmol/L (ref 3.5–5.1)
SODIUM: 137 mmol/L (ref 135–145)
Total Bilirubin: 0.5 mg/dL (ref 0.3–1.2)
Total Protein: 6.4 g/dL — ABNORMAL LOW (ref 6.5–8.1)

## 2015-01-30 LAB — CBC WITH DIFFERENTIAL/PLATELET
BASOS PCT: 0 %
Basophils Absolute: 0 10*3/uL (ref 0.0–0.1)
EOS ABS: 0.2 10*3/uL (ref 0.0–0.7)
Eosinophils Relative: 1 %
HCT: 35.3 % — ABNORMAL LOW (ref 36.0–46.0)
HEMOGLOBIN: 11.1 g/dL — AB (ref 12.0–15.0)
LYMPHS ABS: 1.2 10*3/uL (ref 0.7–4.0)
Lymphocytes Relative: 10 %
MCH: 27.8 pg (ref 26.0–34.0)
MCHC: 31.4 g/dL (ref 30.0–36.0)
MCV: 88.5 fL (ref 78.0–100.0)
MONO ABS: 1.5 10*3/uL — AB (ref 0.1–1.0)
MONOS PCT: 13 %
NEUTROS ABS: 8.7 10*3/uL — AB (ref 1.7–7.7)
NEUTROS PCT: 76 %
Platelets: 282 10*3/uL (ref 150–400)
RBC: 3.99 MIL/uL (ref 3.87–5.11)
RDW: 13.4 % (ref 11.5–15.5)
WBC: 11.6 10*3/uL — ABNORMAL HIGH (ref 4.0–10.5)

## 2015-01-30 LAB — LIPASE, BLOOD: LIPASE: 21 U/L (ref 11–51)

## 2015-01-30 MED ORDER — IOHEXOL 300 MG/ML  SOLN
50.0000 mL | Freq: Once | INTRAMUSCULAR | Status: DC | PRN
  Administered 2015-01-30: 50 mL via ORAL
  Filled 2015-01-30: qty 50

## 2015-01-30 MED ORDER — IOHEXOL 300 MG/ML  SOLN
100.0000 mL | Freq: Once | INTRAMUSCULAR | Status: AC | PRN
  Administered 2015-01-30: 100 mL via INTRAVENOUS

## 2015-01-30 NOTE — Telephone Encounter (Signed)
Attempted to call patient regarding labs and CT scan.  LVM to call back.    Daelyn Mozer M. Lajuana Ripple, DO PGY-2, [place]

## 2015-01-30 NOTE — Telephone Encounter (Signed)
Provider spoke with patient. See notes on labs. Ottis Stain, CMA

## 2015-01-30 NOTE — Progress Notes (Signed)
    Subjective: CC: fevers HPI: Patient is a 49 y.o. female presenting to clinic today for same day appt. Concerns today include:  Fever Patient reports that fevers started last Thursday night.  She notes fevers tended to occur at night.  She was taking Advil for this, which controlled fevers.  She switched to Tylenol yesterday.  Patient notes that the highest she has had is 102.71F (yesterday evening).  She notes chronic pelvic pain.  She notes that pain is a dull ache that is persistent.  She notes abdominal pain.  She notes post prandial pain since Thursday.  She has been eating soups.  Tolerating fluids.  She notes she has had abdominal sx for a perforated bowel in the past.  Patient still has appendix and gallbladder.  Patient also notes that she has a recent dx of uterine fibroids, which are painful, that require surgical resection.  She is supposed to follow up with her PCP regarding this in a few weeks.  BM normal.  No hematochezia or melena.  No vomiting, diarrhea, URI symptoms.  No LMP recorded. Patient is not currently having periods (Reason: Premenopausal).   Social History Reviewed: non smoker. FamHx and MedHx updated.  Please see EMR.  ROS: Per HPI  Objective: Office vital signs reviewed. BP 132/82 mmHg  Temp(Src) 98.4 F (36.9 C) (Oral)  Wt 377 lb 12.8 oz (171.369 kg)  Physical Examination:  General: Awake, alert, obese, NAD HEENT: Normal, MMM Cardio: RRR, S1S2 heard, no murmurs appreciated Pulm: CTAB, no wheezes, rhonchi or rales, normal WOB GI: obese, soft, ND,+BS x4, +Murphy's, +McBurney's, moderate epigastric TTP, no guarding or rebound, R lower abdominal quadrant with palpable firmness/fullness (likely fibroid)  MSK: Normal gait and station  Assessment/ Plan: 49 y.o. female with  Abdominal pain of uncertain etiology.  Patient with known fibroids requiring referral to gyn with possible resection.  However, in the setting of low grade fever and generalized RUQ and  RLQ abdominal pain, cannot r/o acute cholecystis vs appendicitis vs pancreatitis.  1. RUQ abdominal pain, leading dx acute cholecystitis. +Murphy's - Comprehensive metabolic panel, stat - CBC with Differential/Platelet, stat - CT Abdomen Pelvis W Contrast; Stat - Encouraged PO hydration - Emergent precautions reviewed - Will follow up on studies with patient and advise at that time, patient may need to be admitted pending CT  2. RLQ abdominal pain, +Mc Burney's, concern for possible appendicitis  - Comprehensive metabolic panel - CBC with Differential/Platelet - CT Abdomen Pelvis W Contrast; Stat - Emergent precautions reviewed - Will follow up on studies with patient and advise at that time, patient may need to be admitted pending CT  3. Abdominal pain, epigastric, cannot r/o pancreatitis.  Last lipid panel with normal TGs - Lipase, stat - BMET - CBC - Emergent precautions reviewed - Will follow up on studies with patient and advise at that time, patient may need to be admitted pending [place], DO PGY-2, [place]

## 2015-01-30 NOTE — Patient Instructions (Signed)
I have ordered several labs.  I will contact you with your results.  Please have your CT abdomen scan done.  If your symptoms worsen, you are unable to stay hydrated, or your pain worsens, please go immediately to the Emergency Department.

## 2015-01-31 ENCOUNTER — Ambulatory Visit (HOSPITAL_COMMUNITY): Payer: 59

## 2015-02-06 ENCOUNTER — Telehealth: Payer: Self-pay | Admitting: Family Medicine

## 2015-02-06 NOTE — Telephone Encounter (Signed)
MRI was cancelled because waiting to see if [facility] would cover it.  Still no word.  She is still sick and wants to know what is next

## 2015-02-07 NOTE — Telephone Encounter (Signed)
There was never a MRI ordered for this patient. Patient had a CT done, which was approved and completed. Dr. Lajuana Ripple decided to have a CT done instead of the [place] she originally ordered.

## 2015-02-08 ENCOUNTER — Other Ambulatory Visit: Payer: Self-pay | Admitting: Family Medicine

## 2015-02-08 DIAGNOSIS — N838 Other noninflammatory disorders of ovary, fallopian tube and broad ligament: Secondary | ICD-10-CM

## 2015-02-08 NOTE — Telephone Encounter (Signed)
Called patient and LVM to call back.  She called back promptly. We discussed her CT results and the high suspicion of neoplasm.  She understands that MRI will be needed to further evaluate.  CA125 has been ordered.  Patient cannot come in to have lab drawn until tomorrow am.  Will continue to follow with patient and refer to [place] if needed.  Appreciate GYN's assistance with referral coordination.

## 2015-02-09 ENCOUNTER — Other Ambulatory Visit: Payer: 59

## 2015-02-09 DIAGNOSIS — N838 Other noninflammatory disorders of ovary, fallopian tube and broad ligament: Secondary | ICD-10-CM

## 2015-02-09 NOTE — Progress Notes (Signed)
CA-125 DONE TODAY [name] 

## 2015-02-10 LAB — CA 125: CA 125: 21.8 U/mL (ref 0.0–38.1)

## 2015-02-18 ENCOUNTER — Inpatient Hospital Stay (HOSPITAL_COMMUNITY)
Admission: EM | Admit: 2015-02-18 | Discharge: 2015-02-21 | DRG: 690 | Disposition: A | Discharge status: Home / Self Care | Payer: 59 | Attending: Family Medicine | Admitting: Family Medicine

## 2015-02-18 ENCOUNTER — Encounter (HOSPITAL_COMMUNITY): Payer: Self-pay | Admitting: Emergency Medicine

## 2015-02-18 ENCOUNTER — Emergency Department (HOSPITAL_COMMUNITY): Payer: 59

## 2015-02-18 DIAGNOSIS — N838 Other noninflammatory disorders of ovary, fallopian tube and broad ligament: Secondary | ICD-10-CM

## 2015-02-18 DIAGNOSIS — N839 Noninflammatory disorder of ovary, fallopian tube and broad ligament, unspecified: Secondary | ICD-10-CM | POA: Diagnosis present

## 2015-02-18 DIAGNOSIS — Z6841 Body Mass Index (BMI) 40.0 and over, adult: Secondary | ICD-10-CM

## 2015-02-18 DIAGNOSIS — N12 Tubulo-interstitial nephritis, not specified as acute or chronic: Secondary | ICD-10-CM | POA: Diagnosis not present

## 2015-02-18 DIAGNOSIS — N10 Acute tubulo-interstitial nephritis: Principal | ICD-10-CM | POA: Diagnosis present

## 2015-02-18 DIAGNOSIS — K589 Irritable bowel syndrome without diarrhea: Secondary | ICD-10-CM | POA: Diagnosis present

## 2015-02-18 DIAGNOSIS — Z79899 Other long term (current) drug therapy: Secondary | ICD-10-CM

## 2015-02-18 DIAGNOSIS — N179 Acute kidney failure, unspecified: Secondary | ICD-10-CM | POA: Diagnosis present

## 2015-02-18 DIAGNOSIS — Z880 Allergy status to penicillin: Secondary | ICD-10-CM

## 2015-02-18 DIAGNOSIS — I10 Essential (primary) hypertension: Secondary | ICD-10-CM | POA: Diagnosis present

## 2015-02-18 DIAGNOSIS — D638 Anemia in other chronic diseases classified elsewhere: Secondary | ICD-10-CM | POA: Diagnosis present

## 2015-02-18 LAB — URINALYSIS, ROUTINE W REFLEX MICROSCOPIC
Glucose, UA: NEGATIVE mg/dL
Ketones, ur: NEGATIVE mg/dL
Nitrite: NEGATIVE
Protein, ur: 100 mg/dL — AB
Specific Gravity, Urine: 1.029 (ref 1.005–1.030)
Urobilinogen, UA: 2 mg/dL — ABNORMAL HIGH (ref 0.0–1.0)
pH: 5.5 (ref 5.0–8.0)

## 2015-02-18 LAB — COMPREHENSIVE METABOLIC PANEL WITH GFR
ALT: 16 U/L (ref 14–54)
AST: 26 U/L (ref 15–41)
Albumin: 3.2 g/dL — ABNORMAL LOW (ref 3.5–5.0)
Alkaline Phosphatase: 118 U/L (ref 38–126)
Anion gap: 9 (ref 5–15)
BUN: 12 mg/dL (ref 6–20)
CO2: 27 mmol/L (ref 22–32)
Calcium: 9.8 mg/dL (ref 8.9–10.3)
Chloride: 100 mmol/L — ABNORMAL LOW (ref 101–111)
Creatinine, Ser: 1.25 mg/dL — ABNORMAL HIGH (ref 0.44–1.00)
GFR calc Af Amer: 57 mL/min — ABNORMAL LOW
GFR calc non Af Amer: 49 mL/min — ABNORMAL LOW
Glucose, Bld: 139 mg/dL — ABNORMAL HIGH (ref 65–99)
Potassium: 4.2 mmol/L (ref 3.5–5.1)
Sodium: 136 mmol/L (ref 135–145)
Total Bilirubin: 0.7 mg/dL (ref 0.3–1.2)
Total Protein: 7.9 g/dL (ref 6.5–8.1)

## 2015-02-18 LAB — CBC WITH DIFFERENTIAL/PLATELET
Basophils Absolute: 0 K/uL (ref 0.0–0.1)
Basophils Relative: 0 %
Eosinophils Absolute: 0.1 K/uL (ref 0.0–0.7)
Eosinophils Relative: 1 %
HCT: 34.9 % — ABNORMAL LOW (ref 36.0–46.0)
Hemoglobin: 10.8 g/dL — ABNORMAL LOW (ref 12.0–15.0)
Lymphocytes Relative: 12 %
Lymphs Abs: 2.2 K/uL (ref 0.7–4.0)
MCH: 27.3 pg (ref 26.0–34.0)
MCHC: 30.9 g/dL (ref 30.0–36.0)
MCV: 88.1 fL (ref 78.0–100.0)
Monocytes Absolute: 2.1 K/uL — ABNORMAL HIGH (ref 0.1–1.0)
Monocytes Relative: 12 %
Neutro Abs: 13.1 K/uL — ABNORMAL HIGH (ref 1.7–7.7)
Neutrophils Relative %: 75 %
Platelets: 352 K/uL (ref 150–400)
RBC: 3.96 MIL/uL (ref 3.87–5.11)
RDW: 13.7 % (ref 11.5–15.5)
WBC: 17.5 K/uL — ABNORMAL HIGH (ref 4.0–10.5)

## 2015-02-18 LAB — ACETAMINOPHEN LEVEL: Acetaminophen (Tylenol), Serum: <10 ug/mL — ABNORMAL LOW (ref 10–30)

## 2015-02-18 LAB — URINE MICROSCOPIC-ADD ON

## 2015-02-18 LAB — LIPASE, BLOOD: Lipase: 21 U/L (ref 11–51)

## 2015-02-18 LAB — LACTIC ACID, PLASMA
Lactic Acid, Venous: 1.4 mmol/L (ref 0.5–2.0)
Lactic Acid, Venous: 1 mmol/L (ref 0.5–2.0)

## 2015-02-18 MED ORDER — MORPHINE SULFATE (PF) 4 MG/ML IV SOLN
4.0000 mg | Freq: Once | INTRAVENOUS | Status: AC
  Administered 2015-02-18: 4 mg via INTRAVENOUS
  Filled 2015-02-18: qty 1

## 2015-02-18 MED ORDER — ONDANSETRON HCL 4 MG/2ML IJ SOLN
4.0000 mg | Freq: Once | INTRAMUSCULAR | Status: AC
  Administered 2015-02-18: 4 mg via INTRAVENOUS
  Filled 2015-02-18: qty 2

## 2015-02-18 MED ORDER — CIPROFLOXACIN IN D5W 400 MG/200ML IV SOLN
400.0000 mg | Freq: Once | INTRAVENOUS | Status: AC
  Administered 2015-02-18: 400 mg via INTRAVENOUS
  Filled 2015-02-18: qty 200

## 2015-02-18 MED ORDER — SODIUM CHLORIDE 0.9 % IV BOLUS (SEPSIS)
1000.0000 mL | Freq: Once | INTRAVENOUS | Status: AC
  Administered 2015-02-18: 1000 mL via INTRAVENOUS

## 2015-02-18 MED ORDER — IOHEXOL 300 MG/ML  SOLN
100.0000 mL | Freq: Once | INTRAMUSCULAR | Status: AC | PRN
  Administered 2015-02-18: 100 mL via INTRAVENOUS

## 2015-02-18 NOTE — ED Notes (Signed)
Pt was seen recently and told she "has an ovarian mass, places on my pelvic, my uterus." Had CT scan completed. Has MRI scheduled for Tuesday. Results told to patient by PCP Mingo Amber). Was told to take Tylenol for fever. Says she has been running a fever (Tmax 102.9) intermittently for one month. Denies abnormal bleeding but is having intense pain in the right lower groin. Has been vomiting. Reports approximately 5 emesis occurences in the past 24 hours and says she is unable to keep anything down. No other c/c.

## 2015-02-18 NOTE — ED Provider Notes (Signed)
CSN: [id number]     Arrival date & time 02/18/15  1558 History   First MD Initiated Contact with Patient 02/18/15 1647     Chief Complaint  Patient presents with  . Fever  . Emesis     (Consider location/radiation/quality/duration/timing/severity/associated sxs/prior Treatment) HPI   [name] is a 49 y.o F with a pmhx of HTN, diverticulitis with perforation, IBS who presents the emergency department today complaining of right lower quadrant abdominal pain and fever. Patient has been having abdominal pain with nausea and fevers for over a month and a half now. Patient has seen her primary care doctor for this several times. She had a pelvic ultrasound done that suggested uterine fibroids. Patient was scheduled to have them surgically removed however her pain increased and a CT abdomen pelvis was ordered. This scan revealed a large ovarian mass concerning for a neoplasm. Patient is scheduled to have an MRI this Thursday. However this morning patient states her right lower quadrant abdominal pain severely increased in her fever breached 102 at home. Patient has been taking Tylenol and ibuprofen with no relief. Patient also complaining of urgency with urination. Patient is not able to tolerate anything by mouth. Patient has had multiple episodes of emesis today which are nonbloody and nonbilious. Patient states she feels extremely fatigued and weak. Denies dysuria, diarrhea, chest pain, shortness of breath, dizziness, paresthesias, blurry vision.  Past Medical History  Diagnosis Date  . Hypertension   . Hx of migraines   . IBS (irritable bowel syndrome)   . Diverticulitis with perforation 2010   Past Surgical History  Procedure Laterality Date  . Colostomy takedown    . Colostomy  2008  . Cesarean section  1983 and 1984  . Tonsillectomy and adenoidectomy  1997   Family History  Problem Relation Age of Onset  . Diabetes Mother   . Hypertension Mother   . Hyperlipidemia Mother    . Hyperlipidemia Father   . Cancer Daughter 42    Hodgkins  . Diabetes Daughter   . Heart disease Neg Hx   . Stroke Neg Hx    Social History  Substance Use Topics  . Smoking status: Never Smoker   . Smokeless tobacco: None  . Alcohol Use: 0.0 oz/week     Comment: Maybe wine every 6 months   OB History    No data available     Review of Systems  All other systems reviewed and are negative.     Allergies  Penicillins  Home Medications   Prior to Admission medications   Medication Sig Start Date End Date Taking? Authorizing Provider  acetaminophen (TYLENOL) 500 MG tablet Take 1,000 mg by mouth 2 (two) times daily as needed for moderate pain.   Yes Historical Provider, MD  clobetasol cream (TEMOVATE) 0.05 % Apply topically 2 (two) times daily. Patient taking differently: Apply 1 application topically 2 (two) times daily.  12/12/14  Yes Alveda Reasons, MD  naproxen (NAPROSYN) 500 MG tablet Take 1 tablet (500 mg total) by mouth 2 (two) times daily with a meal. 12/12/14  Yes Alveda Reasons, MD  hydrochlorothiazide (HYDRODIURIL) 25 MG tablet Take 1 tablet (25 mg total) by mouth daily. Patient not taking: Reported on 02/18/2015 07/26/13 07/26/14  Alveda Reasons, MD   BP 117/73 mmHg  Pulse 99  Temp(Src) 100.4 F (38 C) (Oral)  Resp 18  SpO2 98% Physical Exam  Constitutional: She is oriented to person, place, and time. She appears well-developed and  well-nourished. She appears distressed.  HENT:  Head: Normocephalic and atraumatic.  Mouth/Throat: No oropharyngeal exudate.  Eyes: Conjunctivae and EOM are normal. Pupils are equal, round, and reactive to light. Right eye exhibits no discharge. Left eye exhibits no discharge. No scleral icterus.  Neck: Neck supple.  Cardiovascular: Normal rate, regular rhythm, normal heart sounds and intact distal pulses.  Exam reveals no gallop and no friction rub.   No murmur heard. Pulmonary/Chest: Effort normal and breath sounds normal. No  respiratory distress. She has no wheezes. She has no rales. She exhibits no tenderness.  Abdominal: Soft. She exhibits no distension. There is tenderness ( RLQ TTP). There is no guarding.  Musculoskeletal: Normal range of motion. She exhibits no edema.  Lymphadenopathy:    She has no cervical adenopathy.  Neurological: She is alert and oriented to person, place, and time. No cranial nerve deficit.  Strength 5/5 throughout. No sensory deficits.    Skin: Skin is warm. No rash noted. She is diaphoretic. No erythema. No pallor.  Psychiatric: She has a normal mood and affect. Her behavior is normal.  Nursing note and vitals reviewed.   ED Course  Procedures (including critical care time) Labs Review Labs Reviewed  COMPREHENSIVE METABOLIC PANEL - Abnormal; Notable for the following:    Chloride 100 (*)    Glucose, Bld 139 (*)    Creatinine, Ser 1.25 (*)    Albumin 3.2 (*)    GFR calc non Af Amer 49 (*)    GFR calc Af Amer 57 (*)    All other components within normal limits  CBC WITH DIFFERENTIAL/PLATELET - Abnormal; Notable for the following:    WBC 17.5 (*)    Hemoglobin 10.8 (*)    HCT 34.9 (*)    Neutro Abs 13.1 (*)    Monocytes Absolute 2.1 (*)    All other components within normal limits  URINALYSIS, ROUTINE W REFLEX MICROSCOPIC (NOT AT [facility]) - Abnormal; Notable for the following:    Color, Urine ORANGE (*)    APPearance TURBID (*)    Hgb urine dipstick LARGE (*)    Bilirubin Urine MODERATE (*)    Protein, ur 100 (*)    Urobilinogen, UA 2.0 (*)    Leukocytes, UA MODERATE (*)    All other components within normal limits  ACETAMINOPHEN LEVEL - Abnormal; Notable for the following:    Acetaminophen (Tylenol), Serum <10 (*)    All other components within normal limits  URINE MICROSCOPIC-ADD ON - Abnormal; Notable for the following:    Squamous Epithelial / LPF MANY (*)    Bacteria, UA MANY (*)    All other components within normal limits  BASIC METABOLIC PANEL - Abnormal;  Notable for the following:    Chloride 100 (*)    Glucose, Bld 121 (*)    Creatinine, Ser 1.21 (*)    GFR calc non Af Amer 51 (*)    GFR calc Af Amer 59 (*)    All other components within normal limits  CBC - Abnormal; Notable for the following:    WBC 13.6 (*)    RBC 3.50 (*)    Hemoglobin 9.5 (*)    HCT 30.6 (*)    All other components within normal limits  URINE CULTURE  LIPASE, BLOOD  LACTIC ACID, PLASMA  LACTIC ACID, PLASMA    Imaging Review No results found. I have personally reviewed and evaluated these images and lab results as part of my medical decision-making.   EKG Interpretation  None      MDM   Final diagnoses:  Ovarian mass    49 y.o F with recent diagnosis of ovarian mass via CT scan, possible neoplasm presents with increased right lower quadrant abdominal pain and fever. Patient has had fever and abdominal pain for over a month. However today patient woke up and pain was severely increased and her fever had breached 102. Reports unintentional weight loss and night sweats. Patient unable to tolerate anything by mouth due to nausea and persistent vomiting. Patient has been seeing PCP for this issue and scheduled her for an MRI pelvis this Thursday.   Will order MRI pelvis today. Patient appears diaphoretic and is significantly warm to the touch. Temperature in ED is 100.4. We'll administer fluids, nausea medication, pain medication.  UA reveals grossly infected urine. Given Cipro as patient has penicillin allergy. WBC 17.5. Lactic acid wnl.  MRI machine at [facility] unfortunately E maintain a weight of 350 pounds. Patient exceeds that limit. Will CT abdomen today to rule out acute abdomen. CT reveals areas of decreased enhancement within the right kidney concerning for right-sided pyelonephritis. Multiple large complex masses on the pelvis, measuring up to 12.5 cm in size. Concern for malignancy. Will need additional MRI to evaluate.  Patient reports  symptomatic improvement with fluids, pain medication and nausea medication.  Feel the patient warrants admission for hydration and additional workup of ovarian masses. We'll speak with family medicine service at [facility] for admission.  Spoke with family medicine hospitalist who will admit patient to their service. We'll arrange for transfer.  Patient was discussed with and seen by Dr. Tamera Punt who agrees with the treatment plan.         Dondra Spry [place], PA-C 02/19/15 [place], MD 02/20/15 [phone]

## 2015-02-19 ENCOUNTER — Inpatient Hospital Stay (HOSPITAL_COMMUNITY): Payer: 59

## 2015-02-19 DIAGNOSIS — N839 Noninflammatory disorder of ovary, fallopian tube and broad ligament, unspecified: Secondary | ICD-10-CM | POA: Diagnosis present

## 2015-02-19 DIAGNOSIS — I10 Essential (primary) hypertension: Secondary | ICD-10-CM | POA: Insufficient documentation

## 2015-02-19 DIAGNOSIS — N179 Acute kidney failure, unspecified: Secondary | ICD-10-CM | POA: Diagnosis present

## 2015-02-19 DIAGNOSIS — N12 Tubulo-interstitial nephritis, not specified as acute or chronic: Secondary | ICD-10-CM

## 2015-02-19 DIAGNOSIS — Z6841 Body Mass Index (BMI) 40.0 and over, adult: Secondary | ICD-10-CM | POA: Diagnosis not present

## 2015-02-19 DIAGNOSIS — Z880 Allergy status to penicillin: Secondary | ICD-10-CM | POA: Diagnosis not present

## 2015-02-19 DIAGNOSIS — N10 Acute tubulo-interstitial nephritis: Secondary | ICD-10-CM | POA: Diagnosis present

## 2015-02-19 DIAGNOSIS — D638 Anemia in other chronic diseases classified elsewhere: Secondary | ICD-10-CM | POA: Diagnosis present

## 2015-02-19 DIAGNOSIS — Z79899 Other long term (current) drug therapy: Secondary | ICD-10-CM | POA: Diagnosis not present

## 2015-02-19 DIAGNOSIS — K589 Irritable bowel syndrome without diarrhea: Secondary | ICD-10-CM | POA: Diagnosis present

## 2015-02-19 DIAGNOSIS — N838 Other noninflammatory disorders of ovary, fallopian tube and broad ligament: Secondary | ICD-10-CM | POA: Insufficient documentation

## 2015-02-19 LAB — BASIC METABOLIC PANEL
ANION GAP: 10 (ref 5–15)
BUN: 11 mg/dL (ref 6–20)
CHLORIDE: 100 mmol/L — AB (ref 101–111)
CO2: 26 mmol/L (ref 22–32)
Calcium: 9.4 mg/dL (ref 8.9–10.3)
Creatinine, Ser: 1.21 mg/dL — ABNORMAL HIGH (ref 0.44–1.00)
GFR calc Af Amer: 59 mL/min — ABNORMAL LOW (ref >60)
GFR calc non Af Amer: 51 mL/min — ABNORMAL LOW (ref >60)
GLUCOSE: 121 mg/dL — AB (ref 65–99)
POTASSIUM: 4 mmol/L (ref 3.5–5.1)
Sodium: 136 mmol/L (ref 135–145)

## 2015-02-19 LAB — CBC
HEMATOCRIT: 30.6 % — AB (ref 36.0–46.0)
HEMOGLOBIN: 9.5 g/dL — AB (ref 12.0–15.0)
MCH: 27.1 pg (ref 26.0–34.0)
MCHC: 31 g/dL (ref 30.0–36.0)
MCV: 87.4 fL (ref 78.0–100.0)
Platelets: 303 10*3/uL (ref 150–400)
RBC: 3.5 MIL/uL — ABNORMAL LOW (ref 3.87–5.11)
RDW: 14 % (ref 11.5–15.5)
WBC: 13.6 10*3/uL — ABNORMAL HIGH (ref 4.0–10.5)

## 2015-02-19 MED ORDER — GADOBENATE DIMEGLUMINE 529 MG/ML IV SOLN
20.0000 mL | Freq: Once | INTRAVENOUS | Status: AC | PRN
  Administered 2015-02-19: 20 mL via INTRAVENOUS

## 2015-02-19 MED ORDER — ACETAMINOPHEN 325 MG PO TABS: 650.0000 mg | ORAL_TABLET | Freq: Four times a day (QID) | ORAL | Status: DC | PRN

## 2015-02-19 MED ORDER — ACETAMINOPHEN 650 MG RE SUPP
650.0000 mg | Freq: Four times a day (QID) | RECTAL | Status: DC | PRN
Start: 2015-02-19 — End: 2015-02-21

## 2015-02-19 MED ORDER — ONDANSETRON HCL 4 MG/2ML IJ SOLN: 4.0000 mg | Freq: Four times a day (QID) | INTRAMUSCULAR | Status: DC | PRN

## 2015-02-19 MED ORDER — ENOXAPARIN SODIUM 40 MG/0.4ML ~~LOC~~ SOLN: 40.0000 mg | SUBCUTANEOUS | Status: DC

## 2015-02-19 MED ORDER — SENNOSIDES-DOCUSATE SODIUM 8.6-50 MG PO TABS: 1.0000 | ORAL_TABLET | Freq: Every evening | ORAL | Status: DC | PRN

## 2015-02-19 MED ORDER — CIPROFLOXACIN IN D5W 400 MG/200ML IV SOLN
400.0000 mg | Freq: Two times a day (BID) | INTRAVENOUS | Status: DC
  Administered 2015-02-19 – 2015-02-21 (×5): 400 mg via INTRAVENOUS
  Filled 2015-02-19 (×6): qty 200

## 2015-02-19 MED ORDER — SODIUM CHLORIDE 0.9 % IV SOLN
INTRAVENOUS | Status: DC
  Filled 2015-02-19: qty 1000

## 2015-02-19 MED ORDER — SODIUM CHLORIDE 0.9 % IV SOLN
INTRAVENOUS | Status: DC
  Administered 2015-02-19: 1 mL via INTRAVENOUS
  Administered 2015-02-20 – 2015-02-21 (×5): via INTRAVENOUS

## 2015-02-19 MED ORDER — MORPHINE SULFATE (PF) 4 MG/ML IV SOLN
4.0000 mg | INTRAVENOUS | Status: DC | PRN
  Administered 2015-02-19 (×2): 4 mg via INTRAVENOUS
  Filled 2015-02-19 (×3): qty 1

## 2015-02-19 NOTE — Consult Note (Signed)
Consult Note: Gyn-Onc  Consult was requested by Dr. Gwendlyn Deutscher for the evaluation of [name] 49 y.o. female  CC:  Chief Complaint  Patient presents with  . Fever  . Emesis  Pelvic masses  Assessment/Plan:  [name]  is a 49 y.o.  year old morbidly obese (BMI 61kg/m2) woman with multiple pelvic and abdominal masses (largest measuring 17cm) with a normal CA 125 (21U/mL).  I discussed with [name] that I am not certain as to whether or not these masses are malignant (eg ovarian cancer) because she has a normal tumor marker and no other signs of metastatic ovarian cancer (ascites, peritoneal carcinomatosis, adenopathy). However, certainly given their large, complex nature, I recommend their removal.  I discussed that we would recommend a total hysterectomy, BSO and resection of masses.  I discussed that she is at particularly increased risk for perioperative morbidity due to her extreme morbid obesity (BMI 61kg/m2) and her prior major abdominal surgeries (perforated diverticular disease with diverting colostomy and reversal in 2006 and 2008).   I discussed that if malignancy is identified, due to her obesity, it is unlikely that optimal staging or debulking will be feasible.  She is at particularly high risks for complications to do with wound healing, and may require prophylactic placement of a wound vac to minimize the chances of wound infection and breakdown.  These masses are asymptomatic, and this surgery will be performed electively after she has resolved her pyelonephritis and after she has been optimized for surgery.  We will schedule her with outpatient followup with me next week for optimization and scheduling of her surgery.   HPI: [name] is a 49 year old parous woman who is seen in consultation at the request of Dr Gwendlyn Deutscher for large pelvic masses.  The patient was admitted to [facility] on 02/18/15 with acute pyelonephritis. As part of workup of  this she underwent CT imaging of the abdomen adn pelvis which revealed large abdomino-pelvic masses.  A confirmatory MRI of the pelvis was performed on 02/19/15 which showed: large complex cystic and solid mass is seen in the right adnexa and central pelvis superior to the uterus which measures 13.0 x 8.6 x 17.5 cm.  A second complex cystic and solid mass is seen in the left adnexa which measures 4.4 x 6.5 x 5.3 cm.  A third cystic and solid mass is seen in anterior to the uterus and superior to the urinary bladder which measures 4.2 x 5.4 x 5.3 cm.  A small amount of ascites is seen. No pelvic lymphadenopathy identified.  The patient's masses are asymptomatic. They had been diagnosed on a CT in October. Following their discovery, a CA 125 had been drawn on 02/09/15 which was normal at 21 U/mL.  The patient has extreme morbid obesity with a BMI of 61kg/m2. She states she is otherwise healthy. She denies DM. She has a surgical history that is remarkable for a sigmoid perforation from diverticulitis in 2008 which required diverting colostomy then take down in 2010. She states she was obese at the time of these surgeries but had no complications.  Current Meds:  No current facility-administered medications on file prior to encounter.   Current Outpatient Prescriptions on File Prior to Encounter  Medication Sig Dispense Refill  . clobetasol cream (TEMOVATE) 0.05 % Apply topically 2 (two) times daily. (Patient taking differently: Apply 1 application topically 2 (two) times daily. ) 30 g 0  . naproxen (NAPROSYN) 500 MG tablet Take  1 tablet (500 mg total) by mouth 2 (two) times daily with a meal. 45 tablet 1  . hydrochlorothiazide (HYDRODIURIL) 25 MG tablet Take 1 tablet (25 mg total) by mouth daily. (Patient not taking: Reported on 02/18/2015) 30 tablet 3     Allergy:  Allergies  Allergen Reactions  . Penicillins Rash    Has patient had a PCN reaction causing immediate rash,  facial/tongue/throat swelling, SOB or lightheadedness with hypotension: no Has patient had a PCN reaction causing severe rash involving mucus membranes or skin necrosis: no Has patient had a PCN reaction that required hospitalization in the hospital at the time Has patient had a PCN reaction occurring within the last 10 years: no If all of the above answers are "NO", then may proceed with Cephalosporin use.     Social Hx:   Social History   Social History  . Marital Status: Legally Separated    Spouse Name: N/A  . Number of Children: N/A  . Years of Education: N/A   Occupational History  . Not on file.   Social History Main Topics  . Smoking status: Never Smoker   . Smokeless tobacco: Not on file  . Alcohol Use: 0.0 oz/week     Comment: Maybe wine every 6 months  . Drug Use: No  . Sexual Activity: Yes     Comment: Told she could not have more children in 1986   Other Topics Concern  . 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   Married x 23 years, recently separated (4/15).  No domestic violence.  + financial stress.     Lives previously with husband who moved out, now with daughter who has medical problems, including Hodgkin's disease, and granddaughter (age 46).     Uses seat belt.     Past Surgical Hx:  Past Surgical History  Procedure Laterality Date  . Colostomy takedown    . Colostomy  2008  . Cesarean section  1983 and 1984  . Tonsillectomy and adenoidectomy  1997    Past Medical Hx:  Past Medical History  Diagnosis Date  . Hypertension   . Hx of migraines   . IBS (irritable bowel syndrome)   . Diverticulitis with perforation 2010    Past Gynecological History:  Cesarean section. Post menopausal x 12 months (including symptoms of menopause).  No LMP recorded. Patient is not currently having periods (Reason: Premenopausal).  Family  Hx:  Family History  Problem Relation Age of Onset  . Diabetes Mother   . Hypertension Mother   . Hyperlipidemia Mother   . Hyperlipidemia Father   . Cancer Daughter 59    Hodgkins  . Diabetes Daughter   . Heart disease Neg Hx   . Stroke Neg Hx     Review of Systems:  Constitutional  Feels unwell with pyelonephritis.  ENT Normal appearing ears and nares bilaterally Skin/Breast  No rash, sores, jaundice, itching, dryness Cardiovascular  No chest pain, shortness of breath, or edema  Pulmonary  No cough or wheeze.  Gastro Intestinal  No nausea, vomitting, or diarrhoea. No bright red blood per rectum, no abdominal pain, change in bowel movement, or constipation.  Genito Urinary  No frequency, urgency, dysuria, no bleeding Musculo Skeletal  No myalgia, arthralgia, joint swelling or pain  Neurologic  No weakness, numbness, change in gait,  Psychology  No depression, anxiety, insomnia.   Vitals:  Blood pressure 138/82, pulse 88, temperature 98.3 F (36.8 C), temperature source Oral, resp. rate 18, height 5\' 5"  (1.651 m), weight 367 lb 9.6 oz (166.742 kg), SpO2 100 %.  Physical Exam: WD in NAD Neck  Supple NROM, without any enlargements.  Lymph Node Survey No cervical supraclavicular or inguinal adenopathy Cardiovascular  Pulse normal rate, regularity and rhythm. S1 and S2 normal.  Lungs  Clear to auscultation bilateraly, without wheezes/crackles/rhonchi. Good air movement.  Skin  No rash/lesions/breakdown  Psychiatry  Alert and oriented to person, place, and time  Abdomen  Normoactive bowel sounds, abdomen soft, non-tender and obese without evidence of hernia. Puckered midline incision. Pannus. Back No CVA tenderness Genito Urinary  deferred Rectal  deferred Extremities  No bilateral cyanosis, clubbing or edema.   Donaciano Eva, MD  02/19/2015, 6:23 PM

## 2015-02-19 NOTE — H&P (Signed)
[facility] Admission History and Physical Service Pager: [phone]  Patient name: [name] Medical record number: [id number] Date of birth: Aug 09, 1965 Age: 49 y.o. Gender: female  Primary Care Provider: Annabell Sabal, MD Consultants: Gyn-Onc Code Status: Full  Chief Complaint: Fever, RLQ abdominal pain, vomiting  Assessment and Plan: [name] is a 49 y.o. female presenting with right pyelonephritis. PMH is significant for adnexal and pelvic masses (likely ovarian neoplasm), HTN, obesity.  1. Pyelonephritis: Pt endorses dysuria, urinary frequency, fever, nausea, and vomiting for the last three days. No CVA tenderness on exam. UA: many bacteria, moderate leukocytes, negative nitrites, 21-50 WBC. CT abdomen/pelvix (11/13): areas of decreased enhancement within the right kidney, concerning for right-sided pyelonephritis with surrounding soft tissue inflammation. On admission, she is afebrile with a WBC of 17.5. - Admitted to [facility], attending Dr. Gwendlyn Deutscher. - Ciprofloxacin per pharm - Urine culture pending - Tylenol and Morphine 4mg  q2hrs prn for pain, Zofran for nausea - Senokot for bowel regimen - CBC and BMET in the am - Strict I/O's - Hydration with NS at 136ml/hr overnight and until can reliably tolerate po  2. Ovarian mass: CT abdomen/pelvis (10/25): 11.7cm right adnexal mass with additional mass-like soft tissue in the midline anterior pelvis and left adnexa. CT abdomen/pelvis (11/13): multiple large complex masses within the pelvis, measuring up to 12.5cm in size. Concern for malignancy.  - Consider Gyn-Onc consult in the am - Will get MRI pelvis to evaluate further - Tylenol and Morphine 4mg  q2hrs prn for pain, Zofran for nausea  3. AKI: Cr on admission was 1.25 (baseline 0.7-0.9). Pre-renal etiology is less likely because BUN/Cr ration is less than 20:1. Could be secondary to metastasis to the kidneys or compression of the ureter by a  pelvic mass. - Hydration with NS at 185ml/hr  - Monitor with daily BMETs  4. Hypertension: BP 116/56 on admission. - Holding home HCTZ in the setting of low blood pressures.  FEN/GI: Heart Healthy diet, MIVFs: NS at 19ml/hr Prophylaxis: Lovenox  Disposition: Admit to [facility], attending Dr. Gwendlyn Deutscher. Anticipate discharge home in 1-3 day pending clinical improvement and pain control.   History of Present Illness:  [name] is a 49 y.o. female presenting with fever, RLQ abdominal pain, and vomiting. She has not been able to keep anything down for 3 days. She has been having fevers for the past month, but today her fever got up to 102.9. She had been telling her doctor's office about her fevers and was advised to take Tylenol and Motrin. Her right lower quadrant abdominal pain has also been going on for the past month. The pain is sometimes sharp and sometimes dull. Some days, the pain is present all day, and other days it comes and goes. The pain radiates to the groin but not to the back. She had a CT scan of her abdomen and she was told that she has a mass on her right ovary. She was supposed to have surgery, but her daughter is getting married, so she wanted to wait until after the wedding to have the surgery. She has been taking pain medications, but has noticed that the pain has not gone away in the last three days. She also endorses dysuria and urinary frequency. She had a UTI in August, which was treated with antibiotics, but the dysuria never went away. She has also had nausea and vomiting for the last 3 days. She has been very fatigued recently and has lost ~20lbs in  the last few weeks. She denies any hematuria or hematochezia. She has not had any vaginal bleeding in the last year. She has had regular menstrual cycles her whole life, then irregular cycles for 4-5 months, and then her menstrual cycles stopped. She has had diarrhea on and off for the last month.   In the [facility] ED, UA  showed large hemoglobin, moderate leukocytes, negative nitrites, 21-50 WBC. She was treated with Ciprofloxacin. They wanted to perform an MRI pelvis, but her body habitus was too large for their MRI machine. A CT abdomen/pelvis was performed instead, which showed areas of decreased enhancement within the right kidney, concerning for right-sided pyelonephritis with surrounding soft tissue inflammation. Also showed multiple large complex masses within the pelvis, measuring up to 12.5cm in size. She was also noted to have an AKI with a creatinine of 1.25 (baseline 0.7-0.9). She was admitted for hydration and observation.  Review Of Systems: Per HPI with the following additions: No SOB, no chest pain, no palpitations. Otherwise the remainder of the systems were negative.  Patient Active Problem List   Diagnosis Date Noted  . Pyelonephritis 02/18/2015  . Stress at home 12/14/2014  . Abdominal pain, left lower quadrant 11/28/2014  . Rash and nonspecific skin eruption 10/29/2013  . Left knee pain 08/04/2013  . Seasonal allergies 07/29/2013  . Hypertension 11/28/2011  . Right knee pain 11/28/2011  . Edema 09/16/2011  . Obesity 06/09/2011  . Preventative health care 06/09/2011    Past Medical History: Past Medical History  Diagnosis Date  . Hypertension   . Hx of migraines   . IBS (irritable bowel syndrome)   . Diverticulitis with perforation 2010    Past Surgical History: Past Surgical History  Procedure Laterality Date  . Colostomy takedown    . Colostomy  2008  . Cesarean section  1983 and 1984  . Tonsillectomy and adenoidectomy  1997    Social History: Social History  Substance Use Topics  . Smoking status: Never Smoker   . Smokeless tobacco: None  . Alcohol Use: 0.0 oz/week     Comment: Maybe wine every 6 months   Additional social history: None Please also refer to relevant sections of EMR.  Family History: Family History  Problem Relation Age of Onset  . Diabetes  Mother   . Hypertension Mother   . Hyperlipidemia Mother   . Hyperlipidemia Father   . Cancer Daughter 14    Hodgkins  . Diabetes Daughter   . Heart disease Neg Hx   . Stroke Neg Hx   Brother- diabetes Sister- diabetes  Allergies and Medications: Allergies  Allergen Reactions  . Penicillins Rash    Has patient had a PCN reaction causing immediate rash, facial/tongue/throat swelling, SOB or lightheadedness with hypotension: no Has patient had a PCN reaction causing severe rash involving mucus membranes or skin necrosis: no Has patient had a PCN reaction that required hospitalization in the hospital at the time Has patient had a PCN reaction occurring within the last 10 years: no If all of the above answers are "NO", then may proceed with Cephalosporin use.    No current facility-administered medications on file prior to encounter.   Current Outpatient Prescriptions on File Prior to Encounter  Medication Sig Dispense Refill  . clobetasol cream (TEMOVATE) 0.05 % Apply topically 2 (two) times daily. (Patient taking differently: Apply 1 application topically 2 (two) times daily. ) 30 g 0  . naproxen (NAPROSYN) 500 MG tablet Take 1 tablet (500 mg  total) by mouth 2 (two) times daily with a meal. 45 tablet 1  . hydrochlorothiazide (HYDRODIURIL) 25 MG tablet Take 1 tablet (25 mg total) by mouth daily. (Patient not taking: Reported on 02/18/2015) 30 tablet 3    Objective: BP 116/56 mmHg  Pulse 88  Temp(Src) 99.8 F (37.7 C) (Oral)  Resp 18  Wt 364 lb 2 oz (165.166 kg)  SpO2 99% Exam: General: Tired-appearing obese female, laying in bed, pleasant, in NAD HEENT: [place]/AT, EOMI, MMM Neck: Supple, buffalo hump present on posterior neck Cardiovascular: RRR, no murmurs, 2+ DP pulses bilaterally Respiratory: CTAB, no wheezes Abdomen: +BS, tender to palpation in the RUQ and RLQ without rebound or guarding, soft; well healed vertical scar extending from umbilicus to pubic mons MSK: No CVA  tenderness, 1+ pitting edema in the feet and ankles bilaterally, moves all four extremities spontaneously. Skin: Warm and dry, no rashes or lesions Neuro: Awake, alert, oriented; CN 2-12 grossly intact; no focal deficits Psych: Appropriate mood and affect  Labs and Imaging: CBC BMET   Recent Labs Lab 02/18/15 1753  WBC 17.5*  HGB 10.8*  HCT 34.9*  PLT 352    Recent Labs Lab 02/18/15 1753  NA 136  K 4.2  CL 100*  CO2 27  BUN 12  CREATININE 1.25*  GLUCOSE 139*  CALCIUM 9.8     Lactic acid: 1.4 > 1.0 Acetaminophen <10 UA: turbid, many bacteria, moderate bilirubin, large Hgb, negative ketones, moderate leukocytes, negative nitrites, 100 protein, many squamous epithelial cells, 21-50 WBC. CT abdomen/pelvis (11/13): Areas of decreased enhancement within the right kidney, concerning for right-sided pyelonephritis. Surrounding soft tissue inflammation noted. Multiple large complex masses within the pelvis, measuring up to 12.5cm in size.  Sela Hua, MD 02/19/2015, 1:13 AM PGY-1, [place] Intern pager: [phone], text pages welcome   I have seen and evaluated the patient with Dr. Brett Albino. I am in agreement with the note above in its revised form. My additions are in red.  Donni Oglesby B. Bonner Puna, MD, PGY-3 02/19/2015 6:59 AM

## 2015-02-19 NOTE — Progress Notes (Signed)
Pt had MRI, she said it took sometime to finish the procedure because of claustrophobia and vomited 1x.

## 2015-02-19 NOTE — Care Management Note (Signed)
Case Management Note  Patient Details  Name: SAVONNA PERRA MRN: [id number] Date of Birth: 1966/01/19  Subjective/Objective:                    Action/Plan:   Expected Discharge Date:                  Expected Discharge Plan:  Home/Self Care  In-House Referral:     Discharge planning Services     Post Acute Care Choice:    Choice offered to:     DME Arranged:    DME Agency:     HH Arranged:    [place] Agency:     Status of Service:  In process, will continue to follow  Medicare Important Message Given:    Date Medicare IM Given:    Medicare IM give by:    Date Additional Medicare IM Given:    Additional Medicare Important Message give by:     If discussed at [place] of Stay Meetings, dates discussed:    Additional Comments:Initial UR completed   Marilu Favre, RN 02/19/2015, 3:12 PM

## 2015-02-19 NOTE — Progress Notes (Signed)
Brief Progress Note: S: Pt is doing okay this morning. She states her abdominal pain is much improved. She has no questions or concerns.   O: Sitting up on side of bed, in NAD, occasionally tearful during our conversation. RRR. Lungs clear. RUQ and RLQ tender to palpation.   A/P: Pyelonephritis - Continue Ciprofloxacin for Pyelonephritis - Follow-up urine culture - Continue hydration with NS at 166ml/hr  Ovarian Mass - Gyn-onc consult this am. - MRI pelvis showing multiple complex cystic and solid masses in both adnexal regions, right side larger than left, and anterior to the uterus. These are suspicious for primary ovarian carcinoma, possible bilateral, with peritoneal metastatic disease.  AKI - Cr stable from 1.25 > 1.21 - Monitor with daily BMETs  HTN- BP 109/62 this am. - Holding HCTZ in the setting of low blood pressures.   Hyman Bible, MD  PGY-1

## 2015-02-20 LAB — BASIC METABOLIC PANEL
Anion gap: 8 (ref 5–15)
BUN: 11 mg/dL (ref 6–20)
CALCIUM: 9 mg/dL (ref 8.9–10.3)
CO2: 22 mmol/L (ref 22–32)
CREATININE: 1.13 mg/dL — AB (ref 0.44–1.00)
Chloride: 107 mmol/L (ref 101–111)
GFR calc non Af Amer: 56 mL/min — ABNORMAL LOW (ref >60)
Glucose, Bld: 114 mg/dL — ABNORMAL HIGH (ref 65–99)
Potassium: 4.2 mmol/L (ref 3.5–5.1)
SODIUM: 137 mmol/L (ref 135–145)

## 2015-02-20 LAB — URINE CULTURE

## 2015-02-20 LAB — CBC
HCT: 29.2 % — ABNORMAL LOW (ref 36.0–46.0)
Hemoglobin: 9.1 g/dL — ABNORMAL LOW (ref 12.0–15.0)
MCH: 27.3 pg (ref 26.0–34.0)
MCHC: 31.2 g/dL (ref 30.0–36.0)
MCV: 87.7 fL (ref 78.0–100.0)
PLATELETS: 281 10*3/uL (ref 150–400)
RBC: 3.33 MIL/uL — ABNORMAL LOW (ref 3.87–5.11)
RDW: 13.9 % (ref 11.5–15.5)
WBC: 11 10*3/uL — ABNORMAL HIGH (ref 4.0–10.5)

## 2015-02-20 LAB — FERRITIN: FERRITIN: 472 ng/mL — AB (ref 11–307)

## 2015-02-20 MED ORDER — ENOXAPARIN SODIUM 80 MG/0.8ML ~~LOC~~ SOLN
80.0000 mg | SUBCUTANEOUS | Status: DC
  Administered 2015-02-20 – 2015-02-21 (×2): 80 mg via SUBCUTANEOUS
  Filled 2015-02-20 (×2): qty 0.8

## 2015-02-20 MED ORDER — TRAZODONE HCL 50 MG PO TABS
50.0000 mg | ORAL_TABLET | Freq: Every evening | ORAL | Status: DC | PRN
  Administered 2015-02-20 (×2): 50 mg via ORAL
  Filled 2015-02-20 (×2): qty 1

## 2015-02-20 MED ORDER — TRAMADOL HCL 50 MG PO TABS
50.0000 mg | ORAL_TABLET | Freq: Four times a day (QID) | ORAL | Status: DC | PRN
Start: 2015-02-20 — End: 2015-02-21
  Administered 2015-02-20 (×2): 100 mg via ORAL
  Filled 2015-02-20 (×2): qty 2

## 2015-02-20 NOTE — Progress Notes (Addendum)
Family Medicine Teaching Service Daily Progress Note Intern Pager: [phone]  Patient name: [name] Medical record number: [id number] Date of birth: March 30, 1966 Age: 49 y.o. Gender: female  Primary Care Provider: Annabell Sabal, MD Consultants: Gyn-onc Code Status: Full  Pt Overview and Major Events to Date:  11/14: Admitted to [facility] with right-sided pyelonephritis  Assessment and Plan: [name] is a 49 y.o. female presenting with right pyelonephritis. PMH is significant for adnexal and pelvic masses (likely ovarian neoplasm), HTN, obesity.  1. Pyelonephritis: Pt endorses dysuria, urinary frequency, fever, nausea, and vomiting x 3 days. No CVA tenderness on exam. UA: many bacteria, moderate leukocytes, negative nitrites, 21-50 WBC. CT abdomen/pelvis (11/13): areas of decreased enhancement within the right kidney, concerning for right-sided pyelonephritis with surrounding soft tissue inflammation. She has been afebrile since admission. WBC of 17.5 > 13.6 > 11.0. She is not really eating or drinking and vomited a few times yesterday. - Ciprofloxacin per pharm (11/14 >). - Urine culture pending - Tylenol for pain, will switch Morphine to Tramadol today. Zofran for nausea. - Senokot for bowel regimen - Strict I/O's - Will get PT eval today, per Pt's request.   2. Ovarian mass: CT abdomen/pelvis (10/25): 11.7cm right adnexal mass with additional mass-like soft tissue in the midline anterior pelvis and left adnexa. CT abdomen/pelvis (11/13): multiple large complex masses within the pelvis, measuring up to 12.5cm in size. Concern for malignancy. MRI pelvis: multiple complex cystic and solid masses in both adnexal regions, R > L, suspicious for primary ovarian carcinoma with peritoneal metastatic disease.  - Gyn-onc consulted, appreciate recommendations. Recommend total hysterectomy, BSO, and resection of masses. Do not feel that these masses are malignant because of their size and  normal CA-125. Follow-up next week in her clinic. - Tylenol for pain, will switch Morphine to Tramadol today, Zofran for nausea  3. AKI: Cr on admission was 1.25 (baseline 0.7-0.9), trended down to 1.13 this am. Pre-renal etiology is less likely because BUN/Cr ration is less than 20:1. Likely due to contrast and Naproxen use at home. - Will continue hydration with NS at 14ml/hr, as Pt is not eating or drinking. - Monitor with daily BMETs  4. Hypertension: BPs ranging from 138-153/55-82 in the last 24 hours. - Holding home HCTZ in the setting of AKI.  5. Anemia, normocytic: Hgb 10.8 on admission > 9.1 today. Was previously normal in 07/2013. Pt has not had a menstrual period in the last year. May be secondary to iron deficiency or anemia of chronic disease. There is probably also a dilutional component with administration of IVFs. - Will check a Ferritin today.  FEN/GI: Heart Healthy diet, will continue MIVFs: NS at 175ml/hr as Pt is not eating or drinking much. Prophylaxis: Lovenox  Disposition: Home pending clinical improvement.  Subjective:  Pt states she is feeling overwhelmed today, with everything going on. She has not been eating or drinking very well. She threw up a couple times last night. She says the pain medications are helping her abdominal pain.  Objective: Temp:  [98.3 F (36.8 C)-99.4 F (37.4 C)] 98.9 F (37.2 C) (11/15 0623) Pulse Rate:  [76-88] 87 (11/15 0623) Resp:  [18] 18 (11/15 0623) BP: (138-153)/(55-82) 140/67 mmHg (11/15 0623) SpO2:  [100 %] 100 % (11/15 0623) Weight:  [368 lb 6.2 oz (167.1 kg)] 368 lb 6.2 oz (167.1 kg) (11/15 [id number]) Physical Exam: General: Tired-appearing obese female, lsitting up in chair, pleasant, in NAD HEENT: [place]/AT, EOMI, MMM Neck: Supple, buffalo hump present  on posterior neck Cardiovascular: RRR, no murmurs, 2+ DP pulses bilaterally Respiratory: CTAB, no wheezes Abdomen: +BS, tender to palpation in the RUQ and RLQ without rebound  or guarding, soft MSK: No CVA tenderness, 1+ pitting edema in the feet and ankles bilaterally, moves all four extremities spontaneously. Skin: Warm and dry, no rashes or lesions Neuro: Awake, alert, oriented; CN 2-12 grossly intact; no focal deficits Psych: Appropriate mood and affect  Laboratory:  Recent Labs Lab 02/18/15 1753 02/19/15 0516 02/20/15 0411  WBC 17.5* 13.6* 11.0*  HGB 10.8* 9.5* 9.1*  HCT 34.9* 30.6* 29.2*  PLT 352 303 281    Recent Labs Lab 02/18/15 1753 02/19/15 0516 02/20/15 0411  NA 136 136 137  K 4.2 4.0 4.2  CL 100* 100* 107  CO2 27 26 22   BUN 12 11 11   CREATININE 1.25* 1.21* 1.13*  CALCIUM 9.8 9.4 9.0  PROT 7.9  --   --   BILITOT 0.7  --   --   ALKPHOS 118  --   --   ALT 16  --   --   AST 26  --   --   GLUCOSE 139* 121* 114*   Lactic acid 1.4 > 1.0 Acetaminophen <10 CA-125: 21.8 UA: many bacteria, moderate leukocytes, negative nitrites, 21-50 WBC.  Imaging/Diagnostic Tests: - CT abdomen/pelvis (10/25): 11.7cm right adnexal mass with additional mass-like soft tissue in the midline anterior pelvis and left adnexa.  - CT abdomen/pelvis (11/13): multiple large complex masses within the pelvis, measuring up to 12.5cm in size. Concern for malignancy.  - MRI pelvis: multiple complex cystic and solid masses in both adnexal regions, R > L, suspicious for primary ovarian carcinoma with peritoneal metastatic disease.   Sela Hua, MD 02/20/2015, 8:16 AM PGY-1, [place] Intern pager: [phone], text pages welcome

## 2015-02-21 LAB — BASIC METABOLIC PANEL
Anion gap: 8 (ref 5–15)
BUN: 6 mg/dL (ref 6–20)
CALCIUM: 8.7 mg/dL — AB (ref 8.9–10.3)
CHLORIDE: 106 mmol/L (ref 101–111)
CO2: 22 mmol/L (ref 22–32)
CREATININE: 1.06 mg/dL — AB (ref 0.44–1.00)
GFR calc non Af Amer: >60 mL/min (ref >60)
GLUCOSE: 105 mg/dL — AB (ref 65–99)
Potassium: 3.9 mmol/L (ref 3.5–5.1)
Sodium: 136 mmol/L (ref 135–145)

## 2015-02-21 LAB — CBC
HEMATOCRIT: 29.9 % — AB (ref 36.0–46.0)
HEMOGLOBIN: 9.2 g/dL — AB (ref 12.0–15.0)
MCH: 27 pg (ref 26.0–34.0)
MCHC: 30.8 g/dL (ref 30.0–36.0)
MCV: 87.7 fL (ref 78.0–100.0)
Platelets: 285 10*3/uL (ref 150–400)
RBC: 3.41 MIL/uL — ABNORMAL LOW (ref 3.87–5.11)
RDW: 14 % (ref 11.5–15.5)
WBC: 10.7 10*3/uL — ABNORMAL HIGH (ref 4.0–10.5)

## 2015-02-21 MED ORDER — CIPROFLOXACIN HCL 500 MG PO TABS: 500.0000 mg | ORAL_TABLET | Freq: Two times a day (BID) | ORAL | Status: DC

## 2015-02-21 MED ORDER — CIPROFLOXACIN HCL 500 MG PO TABS: 500.0000 mg | ORAL_TABLET | Freq: Two times a day (BID) | ORAL | Status: AC

## 2015-02-21 NOTE — Progress Notes (Signed)
IV removed.  Belongings packed. Transportation arranged by patient.  AVS given to patient and understanding demonstrated verbally.

## 2015-02-21 NOTE — Progress Notes (Signed)
Family Medicine Teaching Service Daily Progress Note Intern Pager: [phone]  Patient name: [name] Medical record number: [id number] Date of birth: 1965-08-10 Age: 49 y.o. Gender: female  Primary Care Provider: Annabell Sabal, MD Consultants: Gyn-onc Code Status: Full  Pt Overview and Major Events to Date:  11/14: Admitted to [facility] with right-sided pyelonephritis  Assessment and Plan: [name] is a 49 y.o. female presenting with right pyelonephritis. PMH is significant for adnexal and pelvic masses (likely ovarian neoplasm), HTN, obesity.  1. Pyelonephritis: Pt endorses dysuria, urinary frequency, fever, nausea, and vomiting x 3 days. No CVA tenderness on exam. UA: many bacteria, moderate leukocytes, negative nitrites, 21-50 WBC. CT abdomen/pelvis (11/13): areas of decreased enhancement within the right kidney, concerning for right-sided pyelonephritis with surrounding soft tissue inflammation. She has been afebrile since admission. WBC of 17.5 > 10.7 today. Has had good PO intake over the last 24 hours.  - Ciprofloxacin per pharm (11/14 >). - Urine culture: multiple species present, suggest recollection. Will not recollect at this time, as Pt has been on Cipro. - Tylenol and Tramadol for pain, Zofran for nausea. - Senokot for bowel regimen - Strict I/O's - PT consult   2. Ovarian mass: CT abdomen/pelvis (10/25): 11.7cm right adnexal mass with additional mass-like soft tissue in the midline anterior pelvis and left adnexa. CT abdomen/pelvis (11/13): multiple large complex masses within the pelvis, measuring up to 12.5cm in size. Concern for malignancy. MRI pelvis: multiple complex cystic and solid masses in both adnexal regions, R > L, suspicious for primary ovarian carcinoma with peritoneal metastatic disease.  - Gyn-onc consulted, appreciate recommendations. Recommend total hysterectomy, BSO, and resection of masses. Do not feel that these masses are malignant because of  their size and normal CA-125. Follow-up next week in her clinic. - Tylenol and Tramadol for pain, Zofran for nausea  3. AKI: Cr on admission was 1.25 (baseline 0.7-0.9) > 1.06 this am. Pre-renal etiology is less likely because BUN/Cr ration is less than 20:1. Likely due to contrast and Naproxen use at home. - Will stop IVFs, as Pt has had good PO intake. - Monitor with daily BMETs  4. Hypertension: BPs ranging from 129-152/72-73 in the last 24 hours. - Will monitor BPs today and will restart home HCTZ 25mg  if hypertensive today.  5. Anemia, normocytic: Hgb 10.8 on admission > 9.2 today. Was previously normal in 07/2013. Pt has not had a menstrual period in the last year. Likely secondary to anemia of chronic disease. There is probably also a dilutional component with administration of IVFs. - Ferritin 472. - Will continue to monitor.  FEN/GI: Heart Healthy diet, will stop IVFs today as Pt has had good PO intake. Prophylaxis: Lovenox  Disposition: Home today.  Subjective:  Pt states she feels much better today. She has been drinking well. She feels well enough to go home today. She has no other concerns.  Objective: Temp:  [98.4 F (36.9 C)-99.9 F (37.7 C)] 98.4 F (36.9 C) (11/16 0547) Pulse Rate:  [82-87] 82 (11/16 0547) Resp:  [18] 18 (11/16 0547) BP: (129-152)/(64-73) 129/72 mmHg (11/16 0547) SpO2:  [99 %-100 %] 99 % (11/15 2128) Weight:  [359 lb 12.8 oz (163.204 kg)] 359 lb 12.8 oz (163.204 kg) (11/16 0547) Physical Exam: General: Well-appearing obese female, laying in bed, pleasant, in NAD HEENT: [place]/AT, EOMI, MMM Neck: Supple Cardiovascular: RRR, no murmurs, 2+ DP pulses bilaterally Respiratory: CTAB, no wheezes Abdomen: +BS, tender to palpation in the RUQ and RLQ without rebound or  guarding, soft MSK: No CVA tenderness, 1+ pitting edema in the feet and ankles bilaterally, moves all four extremities spontaneously. Skin: Warm and dry, no rashes or lesions Neuro: Awake,  alert, oriented; CN 2-12 grossly intact; no focal deficits Psych: Appropriate mood and affect  Laboratory:  Recent Labs Lab 02/19/15 0516 02/20/15 0411 02/21/15 0330  WBC 13.6* 11.0* 10.7*  HGB 9.5* 9.1* 9.2*  HCT 30.6* 29.2* 29.9*  PLT 303 281 285    Recent Labs Lab 02/18/15 1753 02/19/15 0516 02/20/15 0411 02/21/15 0330  NA 136 136 137 136  K 4.2 4.0 4.2 3.9  CL 100* 100* 107 106  CO2 27 26 22 22   BUN 12 11 11 6   CREATININE 1.25* 1.21* 1.13* 1.06*  CALCIUM 9.8 9.4 9.0 8.7*  PROT 7.9  --   --   --   BILITOT 0.7  --   --   --   ALKPHOS 118  --   --   --   ALT 16  --   --   --   AST 26  --   --   --   GLUCOSE 139* 121* 114* 105*   Lactic acid 1.4 > 1.0 Acetaminophen <10 CA-125: 21.8 UA: many bacteria, moderate leukocytes, negative nitrites, 21-50 WBC.  Imaging/Diagnostic Tests: - CT abdomen/pelvis (10/25): 11.7cm right adnexal mass with additional mass-like soft tissue in the midline anterior pelvis and left adnexa.  - CT abdomen/pelvis (11/13): multiple large complex masses within the pelvis, measuring up to 12.5cm in size. Concern for malignancy.  - MRI pelvis: multiple complex cystic and solid masses in both adnexal regions, R > L, suspicious for primary ovarian carcinoma with peritoneal metastatic disease.   Sela Hua, MD 02/21/2015, 7:12 AM PGY-1, [place] Intern pager: [phone], text pages welcome

## 2015-02-21 NOTE — Care Management Note (Signed)
Case Management Note  Patient Details  Name: [name] MRN: [id number] Date of Birth: [birth year]  Subjective/Objective:                    Action/Plan:  Awaiting PT eval Expected Discharge Date:                  Expected Discharge Plan:  Home/Self Care  In-House Referral:     Discharge planning Services     Post Acute Care Choice:    Choice offered to:     DME Arranged:    DME Agency:     HH Arranged:    HH Agency:     Status of Service:  In process, will continue to follow  Medicare Important Message Given:    Date Medicare IM Given:    Medicare IM give by:    Date Additional Medicare IM Given:    Additional Medicare Important Message give by:     If discussed at [place] of Stay Meetings, dates discussed:    Additional Comments:  Marilu Favre, RN 02/21/2015, 9:56 AM

## 2015-02-21 NOTE — Discharge Summary (Signed)
[facility] Discharge Summary  Patient name: [name] Medical record number: [id number] Date of birth: 10-07-1965 Age: 49 y.o. Gender: female Date of Admission: 02/18/2015  Date of Discharge: 02/21/15 Admitting Physician: Kinnie Feil, MD  Primary Care Provider: Annabell Sabal, MD Consultants: Gyn-Onc  Indication for Hospitalization: Right sided pyelonephritis  Discharge Diagnoses/Problem List:  Pyelonephritis  Ovarian mass AKI HTN Normocytic anemia  Disposition: Home  Discharge Condition: Stable, improved  Discharge Exam:  General: Well-appearing obese female, laying in bed, pleasant, in NAD HEENT: [place]/AT, EOMI, MMM Neck: Supple Cardiovascular: RRR, no murmurs, 2+ DP pulses bilaterally Respiratory: CTAB, no wheezes Abdomen: +BS, tender to palpation in the RUQ and RLQ without rebound or guarding, soft MSK: No CVA tenderness, 1+ pitting edema in the feet and ankles bilaterally, moves all four extremities spontaneously. Skin: Warm and dry, no rashes or lesions Neuro: Awake, alert, oriented; CN 2-12 grossly intact; no focal deficits Psych: Appropriate mood and affect  Brief Hospital Course:  [name] is a 49 year old female who was transferred from the [facility] ED with fever and RUQ abdominal pain for the last month and vomiting for the last 3 days. In the ED, UA was significant for large hemoglobin, moderate leukocytes, negative nitrites, 21-50 WBC. She received a CT abdomen/pelvis, which showed areas of decreased enhancement within the right kidney, consistent with right-sided pyelonephritis with surrounding inflammation. The CT also showed multiple large complex masses within the pelvis concerning for ovarian malignancy. These masses were seen on previous CT abdomen/pelvis from 01/30/15, which was ordered in clinic to workup her RLQ abdominal pain. Hospital course is described by problem list below.  1. Right sided  pyelonephritis: Pt was started on Ciprofloxacin in the ED and we continued this. She was treated with Tylenol and Morphine for pain, and Zofran for nausea. We eventually transitioned her Morphine to Tramadol. She was hydrated with NS at 131ml/hr because she was unable to tolerate PO. On the day of discharge, her urine culture grew multiple species and it was suggested that we recollect her urine. We chose to not recollect while she was hospitalized because she had been treated with Ciprofloxacin for 3 days and she had improved clinically. If she endorses dysuria or other urinary symptoms, we would recommend a repeat UA and urine culture. She improved throughout hospitalization, was able to tolerate PO, and was discharged. She should complete a 10 day course of Ciprofloxacin.  2. Ovarian masses: CT abdomen/pelvis (10/25) showed 11.7cm right adnexal mass with additional mass-like soft tissue in the midline anterior pelvis and left adnexa. CT abdomen/pelvis (11/13) showed multiple large complex masses within the pelvis, measuring up to 12.5cm in size. We ordered and MRI pelvis to further evaluate these masses, which showed multiple complex cystic and solid masses in both adnexal regions, R>L, suspicious for primary ovarian carcinoma with peritoneal metastatic disease. We consulted Dr. Denman George [facility]), who felt that the size of the masses in addition to a normal CA-125 was reassuring. She was not convinced that these masses were malignant. She recommended total hysterectomy, bilateral SBO, and resection of masses. She was scheduled for follow-up in Dr. Serita Grit clinic on 11/23.  3. AKI: Cr on admission was 1.25. Her baseline Cr appears to be 0.7-0.9. BUN/Cr ratio was < 20:1. We thought her AKI was likely secondary to Naproxen use at home for RLQ pain, in combination with contrast given for her CTs and Naproxen that she was taking at home for RLQ pain. She was  hydrated with MIVFs. On the day of discharge, her Cr  trended down to 1.06 and she was advised to stop taking Naproxen for now.  4. HTN: On admission, we held her home HCTZ 25mg  daily in the setting of dehydration and AKI. Her BPs were somewhat elevated throughout hospitalization, up to 152/73. Her HCTZ was restarted on discharge, given her improvement in creatinine.  5. Normocytic anemia: [name] was noted to have a Hgb of 10.8 on admission, which trended down to 9.2. Her Hgb was previously in the normal range last year. She has not had a menstrual period for 1 year. We checked a ferritin level, which was normal. We thought her anemia was likely secondary to anemia of chronic disease, in combination with dilution from IVFs. We recommend continued monitoring as an outpatient.  Issues for Follow Up:  1. Urine culture grew multiple species, suggest recollection. She was treated with Cipro for pyelo. If she continues having dysuria, would recommend repeat UA and urine culture as an outpatient. 2. [name] presented with an AKI. Her Cr trended down from 1.25 > 1.06 (baseline 0.7-0.9). We recommend repeating her BMET as an outpatient to make sure that her Cr returns to baseline, given that we restarted her HCTZ on discharge.  Significant Procedures: None  Significant Labs and Imaging:   Recent Labs Lab 02/19/15 0516 02/20/15 0411 02/21/15 0330  WBC 13.6* 11.0* 10.7*  HGB 9.5* 9.1* 9.2*  HCT 30.6* 29.2* 29.9*  PLT 303 281 285    Recent Labs Lab 02/18/15 1753 02/19/15 0516 02/20/15 0411 02/21/15 0330  NA 136 136 137 136  K 4.2 4.0 4.2 3.9  CL 100* 100* 107 106  CO2 27 26 22 22   GLUCOSE 139* 121* 114* 105*  BUN 12 11 11 6   CREATININE 1.25* 1.21* 1.13* 1.06*  CALCIUM 9.8 9.4 9.0 8.7*  ALKPHOS 118  --   --   --   AST 26  --   --   --   ALT 16  --   --   --   ALBUMIN 3.2*  --   --   --    Lactic acid 1.4 > 1.0 Acetaminophen <10 CA-125: 21.8 UA: many bacteria, moderate leukocytes, negative nitrites, 21-50 WBC.  - CT  abdomen/pelvis (10/25): 11.7cm right adnexal mass with additional mass-like soft tissue in the midline anterior pelvis and left adnexa.  - CT abdomen/pelvis (11/13): multiple large complex masses within the pelvis, measuring up to 12.5cm in size. Concern for malignancy.  - MRI pelvis: multiple complex cystic and solid masses in both adnexal regions, R > L, suspicious for primary ovarian carcinoma with peritoneal metastatic disease.   Results/Tests Pending at Time of Discharge: None  Discharge Medications:    Medication List    STOP taking these medications        hydrochlorothiazide 25 MG tablet  Commonly known as:  HYDRODIURIL     naproxen 500 MG tablet  Commonly known as:  NAPROSYN      TAKE these medications        acetaminophen 500 MG tablet  Commonly known as:  TYLENOL  Take 1,000 mg by mouth 2 (two) times daily as needed for moderate pain.     ciprofloxacin 500 MG tablet  Commonly known as:  CIPRO  Take 1 tablet (500 mg total) by mouth 2 (two) times daily.     clobetasol cream 0.05 %  Commonly known as:  TEMOVATE  Apply topically 2 (two) times daily.  Discharge Instructions: Please refer to Patient Instructions section of EMR for full details.  Patient was counseled important signs and symptoms that should prompt return to medical care, changes in medications, dietary instructions, activity restrictions, and follow up appointments.   Follow-Up Appointments: Follow-up Information    Follow up with Donaciano Eva, MD On 02/28/2015.   Specialty:  Obstetrics and Gynecology   Why:  at 12:00pm at the [place] attached to [facility].  Please contact the office if this date and time will not work for you.   Contact information:   [place] [address] [phone]       Follow up with Melina Schools, DO On 02/26/2015.   Specialty:  Family Medicine   Why:  Hospital follow-up visit at 9:00am   Contact information:   [place] [address] [phone]       Sela Hua, MD 02/21/2015, 7:48 PM PGY-1, [place]

## 2015-02-21 NOTE — Progress Notes (Signed)
PT Cancellation Note  Patient Details Name: CAIRI SOBOTKA MRN: [id number] DOB: 1965-06-22   Cancelled Treatment:     Pt d/c prior to PT evaluation.   Barbarann Ehlers Rayyan Orsborn, PT, DPT [phone]   02/21/2015, 4:42 PM

## 2015-02-21 NOTE — Discharge Instructions (Signed)
You were hospitalized because you had an infection of your kidney. We treated you with an IV antibiotic called Ciprofloxacin. We switched you to the pill form of Ciprofloxacin when you were discharged. Please continue to take this medication twice a day for the next 6 days.   During your hospitalization, we had the oncologic gynecologist come see you. This is the doctor that specializes in the cancers of the uterus and ovaries. Your follow-up appointment is scheduled with her on Wednesday, November 23 at 12:00pm.

## 2015-02-22 ENCOUNTER — Inpatient Hospital Stay: Admission: RE | Admit: 2015-02-22 | Payer: 59 | Source: Ambulatory Visit

## 2015-02-26 ENCOUNTER — Ambulatory Visit (INDEPENDENT_AMBULATORY_CARE_PROVIDER_SITE_OTHER): Payer: 59 | Admitting: Internal Medicine

## 2015-02-26 ENCOUNTER — Encounter: Payer: Self-pay | Admitting: Internal Medicine

## 2015-02-26 VITALS — BP 143/77

## 2015-02-26 DIAGNOSIS — N179 Acute kidney failure, unspecified: Secondary | ICD-10-CM | POA: Diagnosis not present

## 2015-02-26 DIAGNOSIS — R12 Heartburn: Secondary | ICD-10-CM | POA: Diagnosis not present

## 2015-02-26 DIAGNOSIS — N12 Tubulo-interstitial nephritis, not specified as acute or chronic: Secondary | ICD-10-CM | POA: Diagnosis not present

## 2015-02-26 DIAGNOSIS — N839 Noninflammatory disorder of ovary, fallopian tube and broad ligament, unspecified: Secondary | ICD-10-CM | POA: Diagnosis not present

## 2015-02-26 DIAGNOSIS — N838 Other noninflammatory disorders of ovary, fallopian tube and broad ligament: Secondary | ICD-10-CM

## 2015-02-26 LAB — BASIC METABOLIC PANEL WITH GFR
BUN: 7 mg/dL (ref 7–25)
CHLORIDE: 101 mmol/L (ref 98–110)
CO2: 27 mmol/L (ref 20–31)
Calcium: 9.5 mg/dL (ref 8.6–10.4)
Creat: 0.88 mg/dL (ref 0.50–1.05)
GFR, EST NON AFRICAN AMERICAN: 77 mL/min (ref >=60)
GFR, Est African American: 89 mL/min (ref >=60)
GLUCOSE: 101 mg/dL — AB (ref 65–99)
POTASSIUM: 4.3 mmol/L (ref 3.5–5.3)
Sodium: 140 mmol/L (ref 135–146)

## 2015-02-26 MED ORDER — RANITIDINE HCL 150 MG PO TABS: 150.0000 mg | ORAL_TABLET | Freq: Every day | ORAL | Status: DC

## 2015-02-26 NOTE — Assessment & Plan Note (Signed)
Follow up with Dr. Denman George on 11/23.

## 2015-02-26 NOTE — Assessment & Plan Note (Signed)
Feeling of food coming back up, burning in throat, and dry cough seems consistent with reflux. Patient reports decreased PO intake but appears well hydrated on exam today and has gained weight since hospital discharge. Heartburn could be side effect of the ciprofloxacin. Counseled patient that symptoms might improve after completion of abx course. Gave 2 week Rx for ranitidine today.

## 2015-02-26 NOTE — Addendum Note (Signed)
Addended by: Melina Schools on: 02/26/2015 10:39 AM   Modules accepted: Level of Service

## 2015-02-26 NOTE — Assessment & Plan Note (Signed)
Improving. Asymptomatic, afebrile, and physical exam unremarkable today so do not feel repeat UA and culture is necessary. Patient will finish course of Ciprofloxacin on 11/22.

## 2015-02-26 NOTE — Progress Notes (Signed)
Subjective:    [name] - 49 y.o. female MRN [id number]  Date of birth: Jul 22, 1965  HPI  [name] is here for hospital follow up for right sided pyelonephritis, ovarian masses, and AKI. Additionally, patient reports difficulty keeping food down since discharge. Feeling of food getting stuck in chest with acid/burning. Only feels this sensation after eating.  Also reports dry cough.   1. Right Sided Pyelonephritis  -discharged with ciprofloxacin to complete 10 day course ending on 11/22 -urine culture grew multiple species and recollect was recommended. Inpatient team felt that recollection could be avoided if patient was asymptomatic at f/u because she had been treated with 3 days of abx at time of time  -today denies dysuria, frequency, urgency, or suprapubic pain -has had occasional mild fevers at home, nothing > 101 degrees F and fevers improve with Tylenol   2. AKI  -Cr 1.06 at discharge  -directed to stop taking Naproxen at hospital discharge  -has not taken any Naproxen since discharge   3. Ovarian Masses  -seen on two abdomen CT scans -MRI pelvis ordered during hospital course to further evaluate. MRI showed multiple complex cystic and solid masses in both adnexal regions R>L -Dr. Denman George (Gyn-Onc) felt that size of masses and normal CA-125 was reassuring and she was not convinced the masses were malignant -Denman George recommended total hysterectomy, bilateral salpingo-oophorectomy, and resection of masses  -outpatient follow up scheduled with Dr. Denman George on 11/23    -  reports that she has never smoked. She does not have any smokeless tobacco history on file. - Review of Systems: Per HPI. - Past Medical History: Patient Active Problem List   Diagnosis Date Noted  . Heartburn 02/26/2015  . Morbid obesity with BMI of 60.0-69.9, adult ([place]) 02/19/2015  . Ovarian mass   . AKI (acute kidney injury) ([place])   . Essential hypertension, benign   . Pyelonephritis  02/18/2015  . Stress at home 12/14/2014  . Abdominal pain, left lower quadrant 11/28/2014  . Rash and nonspecific skin eruption 10/29/2013  . Left knee pain 08/04/2013  . Seasonal allergies 07/29/2013  . Hypertension 11/28/2011  . Right knee pain 11/28/2011  . Edema 09/16/2011  . Preventative health care 06/09/2011   - Medications: reviewed and updated Current Outpatient Prescriptions  Medication Sig Dispense Refill  . acetaminophen (TYLENOL) 500 MG tablet Take 1,000 mg by mouth 2 (two) times daily as needed for moderate pain.    . ciprofloxacin (CIPRO) 500 MG tablet Take 1 tablet (500 mg total) by mouth 2 (two) times daily. 13 tablet 0  . clobetasol cream (TEMOVATE) 0.05 % Apply topically 2 (two) times daily. (Patient taking differently: Apply 1 application topically 2 (two) times daily. ) 30 g 0  . ranitidine (ZANTAC) 150 MG tablet Take 1 tablet (150 mg total) by mouth at bedtime. 14 tablet 0   No current facility-administered medications for this visit.      Objective:   Physical Exam There were no vitals taken for this visit. Gen: NAD, alert, cooperative with exam, well-appearing HEENT: NCAT,  oropharynx clear, mucus membranes moist  CV: RRR, good S1/S2, no murmur, 1+ pitting edema in the feet and ankles bilaterally Resp: CTABL, no wheezes, non-labored Abd: soft and obese, +BS, mildly tender to palpation in RLQ and LLQ without rebound or guarding  MSK: No CVA tenderness  Neuro: no gross deficits.  Psych: good insight, alert and oriented        Assessment & Plan:  AKI (acute kidney injury) ([place]) Improving at time of hospital discharge (Cr 1.06). Repeat BMET today. Instructed patient to continue to hold Naproxen.   Pyelonephritis Improving. Asymptomatic, afebrile, and physical exam unremarkable today so do not feel repeat UA and culture is necessary. Patient will finish course of Ciprofloxacin on 11/22.   Ovarian mass Follow up with Dr. Denman George on 11/23.    Heartburn Feeling of food coming back up, burning in throat, and dry cough seems consistent with reflux. Patient reports decreased PO intake but appears well hydrated on exam today and has gained weight since hospital discharge. Heartburn could be side effect of the ciprofloxacin. Counseled patient that symptoms might improve after completion of abx course. Gave 2 week Rx for ranitidine today.      Phill Myron, D.O. 02/26/2015, 9:55 AM PGY-1, [place]

## 2015-02-26 NOTE — Assessment & Plan Note (Signed)
Improving at time of hospital discharge (Cr 1.06). Repeat BMET today. Instructed patient to continue to hold Naproxen.

## 2015-02-26 NOTE — Patient Instructions (Addendum)
Take the Ranitidine for two weeks to help with the feelings of burning and food stuck in your throat. It sounds like you are suffering from acid reflux so hopefully this medication will help. Finish the ciprofloxacin. I will call you if there is anything concerning on your lab results.   Take Care,   Dr. Juleen China

## 2015-02-27 ENCOUNTER — Telehealth: Payer: Self-pay | Admitting: Internal Medicine

## 2015-02-27 NOTE — Telephone Encounter (Signed)
Left message for patient to return call.   Calling to let her know that her Cr was WNL and that her kidney function has improved since discharge from the hospital.   Phill Myron, D.O. 02/27/2015, 9:08 AM PGY-1, [place]

## 2015-02-28 ENCOUNTER — Encounter: Payer: Self-pay | Admitting: Gynecologic Oncology

## 2015-02-28 ENCOUNTER — Ambulatory Visit: Payer: 59 | Attending: Gynecologic Oncology | Admitting: Gynecologic Oncology

## 2015-02-28 VITALS — BP 147/88 | HR 90 | Temp 97.9°F | Ht 65.5 in | Wt 375.8 lb

## 2015-02-28 DIAGNOSIS — N838 Other noninflammatory disorders of ovary, fallopian tube and broad ligament: Secondary | ICD-10-CM

## 2015-02-28 DIAGNOSIS — N839 Noninflammatory disorder of ovary, fallopian tube and broad ligament, unspecified: Secondary | ICD-10-CM | POA: Diagnosis present

## 2015-02-28 NOTE — Progress Notes (Signed)
Followup of Inpatient Consult: Gyn-Onc  CC:  Chief Complaint  Patient presents with  . Ovarian Mass    New patient    Assessment/Plan:  [name]  is a 49 y.o.  year old with a new diagnosis of a complex pelvic mass found incidentally at the time of an admission for pyelonephritis and AKI. She is morbidly obese with a BMI of 62kg/m2.  I am recommending TAH, BSO and possible debulking. We discussed surgical risk including  bleeding, infection, damage to internal organs (such as bladder,ureters, bowels), blood clot, reoperation and rehospitalization. I discussed that she is at higher risk for complication given her extreme morbid obesity and her prior surgical history including ex lap with sigmoid colectomy and colostomy takedown for perforated diverticular disease.  She will need a type and cross as she is anemic.  We will provide her with prophylaxis for colonic surgery given her high risk for needing colonic surgery (given her prior surgery and certain adhesions).   HPI: [name] is a very pleasant 49 year old woman with history of 2 prior cesarean sections who is seen initially in the hospital as an inpatient consultation at [facility] on 02/19/2015 for complex pelvic masses. The patient is a morbidly obese woman with a BMI 62 kg/m. She was admitted to [facility] on 02/18/2015 with pyelonephritis. CT imaging and then MRI imaging was performed to evaluate for an etiology for pyelonephritis. This identified A large pelvic mass which it to be arising from the right adnexa. It measured 13 x 9 x 17.5 cm and was irregular cystic and solid in nature. A second, but cystic and solid lesion within the left adnexa measuring 4.4 x 6.5 x 5.3 cm. In the third cystic and solid mass was seen anterior to the uterus and superior to the bladder measuring 4.2 x 5.4 x 5.3 cm. She had no pelvic lymphadenopathy, no carcinomatosis, and CA 125 was normal at 21U/mL.  Her infection was adequately  treated with IV antibiotics and her acute kidney injury which manifested with an elevated creatinine 12.4 spontaneously resolved during her hospital stay with avoidance of NSAID and good hydration. She was noted to be anemic during her hospitalization with a hemoglobin of 10.8. However this did not require transfusion and spontaneously improved over time.   Interval History: Since discharge she has been feeling much better. She denies vaginal bleeding.  Current Meds:  Outpatient Encounter Prescriptions as of 02/28/2015  Medication Sig  . acetaminophen (TYLENOL) 500 MG tablet Take 1,000 mg by mouth 2 (two) times daily as needed for moderate pain.  . clobetasol cream (TEMOVATE) 0.05 % Apply topically 2 (two) times daily. (Patient taking differently: Apply 1 application topically 2 (two) times daily. )  . ranitidine (ZANTAC) 150 MG tablet Take 1 tablet (150 mg total) by mouth at bedtime.   No facility-administered encounter medications on file as of 02/28/2015.    Allergy:  Allergies  Allergen Reactions  . Penicillins Rash    Has patient had a PCN reaction causing immediate rash, facial/tongue/throat swelling, SOB or lightheadedness with hypotension: no Has patient had a PCN reaction causing severe rash involving mucus membranes or skin necrosis: no Has patient had a PCN reaction that required hospitalization in the hospital at the time Has patient had a PCN reaction occurring within the last 10 years: no If all of the above answers are "NO", then may proceed with Cephalosporin use.     Social Hx:   Social History   Social  History  . Marital Status: Legally Separated    Spouse Name: N/A  . Number of Children: N/A  . Years of Education: N/A   Occupational History  . Not on file.   Social History Main Topics  . Smoking status: Never Smoker   . Smokeless tobacco: Not on file  . Alcohol Use: 0.0 oz/week     Comment: Maybe wine every 6 months  . Drug Use: No  . Sexual Activity:  Yes     Comment: Told she could not have more children in 1986   Other Topics Concern  . 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   Married x 23 years, recently separated (4/15).  No domestic violence.  + financial stress.     Lives previously with husband who moved out, now with daughter who has medical problems, including Hodgkin's disease, and granddaughter (age 5).     Uses seat belt.     Past Surgical Hx:  Past Surgical History  Procedure Laterality Date  . Colostomy takedown    . Colostomy  2008  . Cesarean section  1983 and 1984  . Tonsillectomy and adenoidectomy  1997    Past Medical Hx:  Past Medical History  Diagnosis Date  . Hypertension   . Hx of migraines   . IBS (irritable bowel syndrome)   . Diverticulitis with perforation 2010    Past Gynecological History:  C/s x 2  No LMP recorded. Patient is not currently having periods (Reason: Premenopausal).  Family Hx:  Family History  Problem Relation Age of Onset  . Diabetes Mother   . Hypertension Mother   . Hyperlipidemia Mother   . Hyperlipidemia Father   . Cancer Daughter 8    Hodgkins  . Diabetes Daughter   . Heart disease Neg Hx   . Stroke Neg Hx     Review of Systems:  Constitutional  Feels well,    ENT Normal appearing ears and nares bilaterally Skin/Breast  No rash, sores, jaundice, itching, dryness Cardiovascular  No chest pain, shortness of breath, or edema  Pulmonary  No cough or wheeze.  Gastro Intestinal  No nausea, vomitting, or diarrhoea. No bright red blood per rectum, +abdominal pain, nochange in bowel movement, or constipation.  Genito Urinary  No frequency, urgency, dysuria,  Musculo Skeletal  No myalgia, arthralgia, joint swelling or pain  Neurologic  No weakness, numbness, change in gait,  Psychology  No depression, anxiety, insomnia.   Vitals:   Blood pressure 147/88, pulse 90, temperature 97.9 F (36.6 C), temperature source Oral, height 5' 5.5" (1.664 m), weight 375 lb 12.8 oz (170.462 kg).  Physical Exam: WD in NAD Neck  Supple NROM, without any enlargements.  Lymph Node Survey No cervical supraclavicular or inguinal adenopathy Cardiovascular  Pulse normal rate, regularity and rhythm. S1 and S2 normal.  Lungs  Clear to auscultation bilateraly, without wheezes/crackles/rhonchi. Good air movement.  Skin  No rash/lesions/breakdown  Psychiatry  Alert and oriented to person, place, and time  Abdomen  Normoactive bowel sounds, abdomen soft, non-tender and obese without evidence of hernia. There is a large midline incision and colstomy site that are puckered but well healed Back No CVA tenderness Genito Urinary  Vulva/vagina: Normal external female genitalia.  No lesions. No discharge or bleeding.  Bladder/urethra:  No lesions or masses, well supported bladder  Vagina:  normal  Cervix: Normal appearing, no lesions.  Uterus: Small, mobile, no parametrial involvement or nodularity.  Adnexa: no palpable masses. Rectal  Good tone, no masses no cul de sac nodularity.  Extremities  No bilateral cyanosis, clubbing or edema.   Donaciano Eva, MD  02/28/2015, 3:39 PM

## 2015-02-28 NOTE — Patient Instructions (Addendum)
Preparing for your Surgery  Plan for surgery on December 6 with Dr. Denman George.  You will be scheduled for a total abdominal hysterectomy, bilateral salpingo-oophorectomy, possible staging  Pre-operative Testing -You will receive a phone call from presurgical testing at [facility] to arrange for a pre-operative testing appointment before your surgery.  This appointment normally occurs one to two weeks before your scheduled surgery.   -Bring your insurance card, copy of an advanced directive if applicable, medication list  -At that visit, you will be asked to sign a consent for a possible blood transfusion in case a transfusion becomes necessary during surgery.  The need for a blood transfusion is rare but having consent is a necessary part of your care.     -You should not be taking blood thinners or aspirin at least ten days prior to surgery unless instructed by your surgeon.  Day Before Surgery at [place] will be asked to take in only clear liquids the day before surgery.  Examples of clear liquids include broths, jello, and clear juices.  Avoid carbonated beverages.  You will be advised to have nothing to eat or drink after midnight the evening before.    Your role in recovery Your role is to become active as soon as directed by your doctor, while still giving yourself time to heal.  Rest when you feel tired. You will be asked to do the following in order to speed your recovery:  - Cough and breathe deeply. This helps toclear and expand your lungs and can prevent pneumonia. You may be given a spirometer to practice deep breathing. A staff member will show you how to use the spirometer. - Do mild physical activity. Walking or moving your legs help your circulation and body functions return to normal. A staff member will help you when you try to walk and will provide you with simple exercises. Do not try to get up or walk alone the first time. - Actively manage your pain.  Managing your pain lets you move in comfort. We will ask you to rate your pain on a scale of zero to 10. It is your responsibility to tell your doctor or nurse where and how much you hurt so your pain can be treated.  Special Considerations -If you are diabetic, you may be placed on insulin after surgery to have closer control over your blood sugars to promote healing and recovery.  This does not mean that you will be discharged on insulin.  If applicable, your oral antidiabetics will be resumed when you are tolerating a solid diet.  -Your final pathology results from surgery should be available by the Friday after surgery and the results will be relayed to you when available.  Blood Transfusion Information WHAT IS A BLOOD TRANSFUSION? A transfusion is the replacement of blood or some of its parts. Blood is made up of multiple cells which provide different functions.  Red blood cells carry oxygen and are used for blood loss replacement.  White blood cells fight against infection.  Platelets control bleeding.  Plasma helps clot blood.  Other blood products are available for specialized needs, such as hemophilia or other clotting disorders. BEFORE THE TRANSFUSION  Who gives blood for transfusions?   You may be able to donate blood to be used at a later date on yourself (autologous donation).  Relatives can be asked to donate blood. This is generally not any safer than if you have received blood from a stranger. The same  precautions are taken to ensure safety when a relative's blood is donated.  Healthy volunteers who are fully evaluated to make sure their blood is safe. This is blood bank blood. Transfusion therapy is the safest it has ever been in the practice of medicine. Before blood is taken from a donor, a complete history is taken to make sure that person has no history of diseases nor engages in risky social behavior (examples are intravenous drug use or sexual activity with multiple  partners). The donor's travel history is screened to minimize risk of transmitting infections, such as malaria. The donated blood is tested for signs of infectious diseases, such as HIV and hepatitis. The blood is then tested to be sure it is compatible with you in order to minimize the chance of a transfusion reaction. If you or a relative donates blood, this is often done in anticipation of surgery and is not appropriate for emergency situations. It takes many days to process the donated blood. RISKS AND COMPLICATIONS Although transfusion therapy is very safe and saves many lives, the main dangers of transfusion include:   Getting an infectious disease.  Developing a transfusion reaction. This is an allergic reaction to something in the blood you were given. Every precaution is taken to prevent this. The decision to have a blood transfusion has been considered carefully by your caregiver before blood is given. Blood is not given unless the benefits outweigh the risks.

## 2015-03-06 ENCOUNTER — Telehealth: Payer: Self-pay | Admitting: Family Medicine

## 2015-03-06 NOTE — Telephone Encounter (Signed)
Forms placed in PCP box to be completed. Katharina Caper, April D, [place]

## 2015-03-06 NOTE — Telephone Encounter (Signed)
Patient's Daughter brought FMLA Form to be completed and signe by PCP. Please, follow up with Patient.

## 2015-03-06 NOTE — Patient Instructions (Addendum)
[name]  03/06/2015   Your procedure is scheduled on: Tuesday March 13, 2015   Report to [facility] Main  Entrance take [place]  elevators to 3rd floor to  [place] at 5:00 AM.  Call this number if you have problems the morning of surgery [phone]   Remember: ONLY 1 PERSON MAY GO WITH YOU TO SHORT STAY TO GET  READY MORNING OF [place].  Do not eat food :After Midnight Sunday night, clear liquids all day Monday 03-12-15, no carbonated beverages, nothing by mouth after midnight Monday December 5th.      Take these medicines the morning of surgery with A SIP OF WATER: NONE              You may not have any metal on your body including hair pins and              piercings  Do not wear jewelry, make-up, lotions, powders or perfumes, deodorant             Do not wear nail polish.  Do not shave  48 hours prior to surgery.              Do not bring valuables to the hospital. [place].  Contacts, dentures or bridgework may not be worn into surgery.  Leave suitcase in the car. After surgery it may be brought to your room.                Please read over the following fact sheets you were given:Incentive Spirometer; Blood Transfusion Information  _____________________________________________________________________             [facility] - Preparing for Surgery Before surgery, you can play an important role.  Because skin is not sterile, your skin needs to be as free of germs as possible.  You can reduce the number of germs on your skin by washing with CHG (chlorahexidine gluconate) soap before surgery.  CHG is an antiseptic cleaner which kills germs and bonds with the skin to continue killing germs even after washing. Please DO NOT use if you have an allergy to CHG or antibacterial soaps.  If your skin becomes reddened/irritated stop using the CHG and inform your nurse when you arrive at  Short Stay. Do not shave (including legs and underarms) for at least 48 hours prior to the first CHG shower.  You may shave your face/neck. Please follow these instructions carefully:  1.  Shower with CHG Soap the night before surgery and the  morning of Surgery.  2.  If you choose to wash your hair, wash your hair first as usual with your  normal  shampoo.  3.  After you shampoo, rinse your hair and body thoroughly to remove the  shampoo.                           4.  Use CHG as you would any other liquid soap.  You can apply chg directly  to the skin and wash                       Gently with a scrungie or clean washcloth.  5.  Apply the CHG Soap to your  body ONLY FROM THE NECK DOWN.   Do not use on face/ open                           Wound or open sores. Avoid contact with eyes, ears mouth and genitals (private parts).                       Wash face,  Genitals (private parts) with your normal soap.             6.  Wash thoroughly, paying special attention to the area where your surgery  will be performed.  7.  Thoroughly rinse your body with warm water from the neck down.  8.  DO NOT shower/wash with your normal soap after using and rinsing off  the CHG Soap.                9.  Pat yourself dry with a clean towel.            10.  Wear clean pajamas.            11.  Place clean sheets on your bed the night of your first shower and do not  sleep with pets. Day of Surgery : Do not apply any lotions/deodorants the morning of surgery.  Please wear clean clothes to the hospital/surgery center.  FAILURE TO FOLLOW THESE INSTRUCTIONS MAY RESULT IN THE CANCELLATION OF YOUR SURGERY PATIENT SIGNATURE_________________________________  NURSE SIGNATURE__________________________________  ________________________________________________________________________    CLEAR LIQUID DIET   Foods Allowed                                                                     Foods Excluded  Coffee and tea,  regular and decaf                             liquids that you cannot  Plain Jell-O in any flavor                                             see through such as: Fruit ices (not with fruit pulp)                                     milk, soups, orange juice  Iced Popsicles                                    All solid food                           Cranberry, grape and apple juices Sports drinks like Gatorade Lightly seasoned clear broth or consume(fat free) Sugar, honey syrup  Sample Menu Breakfast  Lunch                                     Supper Cranberry juice                    Beef broth                            Chicken broth Jell-O                                     Grape juice                           Apple juice Coffee or tea                        Jell-O                                      Popsicle                                                Coffee or tea                        Coffee or tea  _____________________________________________________________________    Incentive Spirometer  An incentive spirometer is a tool that can help keep your lungs clear and active. This tool measures how well you are filling your lungs with each breath. Taking long deep breaths may help reverse or decrease the chance of developing breathing (pulmonary) problems (especially infection) following:  A long period of time when you are unable to move or be active. BEFORE THE PROCEDURE   If the spirometer includes an indicator to show your best effort, your nurse or respiratory therapist will set it to a desired goal.  If possible, sit up straight or lean slightly forward. Try not to slouch.  Hold the incentive spirometer in an upright position. INSTRUCTIONS FOR USE   Sit on the edge of your bed if possible, or sit up as far as you can in bed or on a chair.  Hold the incentive spirometer in an upright position.  Breathe out normally.  Place the  mouthpiece in your mouth and seal your lips tightly around it.  Breathe in slowly and as deeply as possible, raising the piston or the ball toward the top of the column.  Hold your breath for 3-5 seconds or for as long as possible. Allow the piston or ball to fall to the bottom of the column.  Remove the mouthpiece from your mouth and breathe out normally.  Rest for a few seconds and repeat Steps 1 through 7 at least 10 times every 1-2 hours when you are awake. Take your time and take a few normal breaths between deep breaths.  The spirometer may include an indicator to show your best effort. Use the indicator as a goal to work toward during each repetition.  After each set of 10 deep breaths,  practice coughing to be sure your lungs are clear. If you have an incision (the cut made at the time of surgery), support your incision when coughing by placing a pillow or rolled up towels firmly against it. Once you are able to get out of bed, walk around indoors and cough well. You may stop using the incentive spirometer when instructed by your caregiver.  RISKS AND COMPLICATIONS  Take your time so you do not get dizzy or light-headed.  If you are in pain, you may need to take or ask for pain medication before doing incentive spirometry. It is harder to take a deep breath if you are having pain. AFTER USE  Rest and breathe slowly and easily.  It can be helpful to keep track of a log of your progress. Your caregiver can provide you with a simple table to help with this. If you are using the spirometer at home, follow these instructions: [place] IF:   You are having difficultly using the spirometer.  You have trouble using the spirometer as often as instructed.  Your pain medication is not giving enough relief while using the spirometer.  You develop fever of 100.5 F (38.1 C) or higher. SEEK IMMEDIATE MEDICAL CARE IF:   You cough up bloody sputum that had not been present  before.  You develop fever of 102 F (38.9 C) or greater.  You develop worsening pain at or near the incision site. MAKE SURE YOU:   Understand these instructions.  Will watch your condition.  Will get help right away if you are not doing well or get worse. Document Released: 08/04/2006 Document Revised: 06/16/2011 Document Reviewed: 10/05/2006 Exit[employer] Patient Information 2014 Exit[employer], [place].   ________________________________________________________________________  WHAT IS A BLOOD TRANSFUSION? Blood Transfusion Information  A transfusion is the replacement of blood or some of its parts. Blood is made up of multiple cells which provide different functions.  Red blood cells carry oxygen and are used for blood loss replacement.  White blood cells fight against infection.  Platelets control bleeding.  Plasma helps clot blood.  Other blood products are available for specialized needs, such as hemophilia or other clotting disorders. BEFORE THE TRANSFUSION  Who gives blood for transfusions?   Healthy volunteers who are fully evaluated to make sure their blood is safe. This is blood bank blood. Transfusion therapy is the safest it has ever been in the practice of medicine. Before blood is taken from a donor, a complete history is taken to make sure that person has no history of diseases nor engages in risky social behavior (examples are intravenous drug use or sexual activity with multiple partners). The donor's travel history is screened to minimize risk of transmitting infections, such as malaria. The donated blood is tested for signs of infectious diseases, such as HIV and hepatitis. The blood is then tested to be sure it is compatible with you in order to minimize the chance of a transfusion reaction. If you or a relative donates blood, this is often done in anticipation of surgery and is not appropriate for emergency situations. It takes many days to process the donated  blood. RISKS AND COMPLICATIONS Although transfusion therapy is very safe and saves many lives, the main dangers of transfusion include:   Getting an infectious disease.  Developing a transfusion reaction. This is an allergic reaction to something in the blood you were given. Every precaution is taken to prevent this. The decision to have a blood transfusion has been  considered carefully by your caregiver before blood is given. Blood is not given unless the benefits outweigh the risks. AFTER THE TRANSFUSION  Right after receiving a blood transfusion, you will usually feel much better and more energetic. This is especially true if your red blood cells have gotten low (anemic). The transfusion raises the level of the red blood cells which carry oxygen, and this usually causes an energy increase.  The nurse administering the transfusion will monitor you carefully for complications. HOME CARE INSTRUCTIONS  No special instructions are needed after a transfusion. You may find your energy is better. Speak with your caregiver about any limitations on activity for underlying diseases you may have. SEEK MEDICAL CARE IF:   Your condition is not improving after your transfusion.  You develop redness or irritation at the intravenous (IV) site. SEEK IMMEDIATE MEDICAL CARE IF:  Any of the following symptoms occur over the next 12 hours:  Shaking chills.  You have a temperature by mouth above 102 F (38.9 C), not controlled by medicine.  Chest, back, or muscle pain.  People around you feel you are not acting correctly or are confused.  Shortness of breath or difficulty breathing.  Dizziness and fainting.  You get a rash or develop hives.  You have a decrease in urine output.  Your urine turns a dark color or changes to pink, red, or brown. Any of the following symptoms occur over the next 10 days:  You have a temperature by mouth above 102 F (38.9 C), not controlled by  medicine.  Shortness of breath.  Weakness after normal activity.  The white part of the eye turns yellow (jaundice).  You have a decrease in the amount of urine or are urinating less often.  Your urine turns a dark color or changes to pink, red, or brown. Document Released: 03/21/2000 Document Revised: 06/16/2011 Document Reviewed: 11/08/2007 [facility] Patient Information 2014 [place], [place].  _______________________________________________________________________

## 2015-03-07 ENCOUNTER — Ambulatory Visit (HOSPITAL_COMMUNITY)
Admission: RE | Admit: 2015-03-07 | Discharge: 2015-03-07 | Disposition: A | Discharge status: Home / Self Care | Payer: 59 | Source: Ambulatory Visit | Attending: Anesthesiology | Admitting: Anesthesiology

## 2015-03-07 ENCOUNTER — Encounter (HOSPITAL_COMMUNITY): Payer: Self-pay

## 2015-03-07 ENCOUNTER — Encounter (HOSPITAL_COMMUNITY)
Admission: RE | Admit: 2015-03-07 | Discharge: 2015-03-07 | Disposition: A | Discharge status: Home / Self Care | Payer: 59 | Source: Ambulatory Visit | Attending: Gynecologic Oncology | Admitting: Gynecologic Oncology

## 2015-03-07 DIAGNOSIS — Z01818 Encounter for other preprocedural examination: Secondary | ICD-10-CM | POA: Insufficient documentation

## 2015-03-07 DIAGNOSIS — Z01811 Encounter for preprocedural respiratory examination: Secondary | ICD-10-CM | POA: Insufficient documentation

## 2015-03-07 DIAGNOSIS — R05 Cough: Secondary | ICD-10-CM | POA: Diagnosis present

## 2015-03-07 DIAGNOSIS — I10 Essential (primary) hypertension: Secondary | ICD-10-CM | POA: Insufficient documentation

## 2015-03-07 DIAGNOSIS — Z01812 Encounter for preprocedural laboratory examination: Secondary | ICD-10-CM | POA: Insufficient documentation

## 2015-03-07 DIAGNOSIS — R059 Cough, unspecified: Secondary | ICD-10-CM

## 2015-03-07 HISTORY — DX: Tubulo-interstitial nephritis, not specified as acute or chronic: N12

## 2015-03-07 HISTORY — DX: Family history of other specified conditions: Z84.89

## 2015-03-07 HISTORY — DX: Gastro-esophageal reflux disease without esophagitis: K21.9

## 2015-03-07 HISTORY — DX: Renal tubulo-interstitial disease, unspecified: N15.9

## 2015-03-07 HISTORY — DX: Headache: R51

## 2015-03-07 HISTORY — DX: Personal history of other diseases of the nervous system and sense organs: Z86.69

## 2015-03-07 HISTORY — DX: Personal history of other diseases of the respiratory system: Z87.09

## 2015-03-07 HISTORY — DX: Cough: R05

## 2015-03-07 HISTORY — DX: Headache, unspecified: R51.9

## 2015-03-07 HISTORY — DX: Anemia, unspecified: D64.9

## 2015-03-07 HISTORY — DX: Morbid (severe) obesity due to excess calories: E66.01

## 2015-03-07 LAB — COMPREHENSIVE METABOLIC PANEL
ALK PHOS: 58 U/L (ref 38–126)
ALT: 13 U/L — AB (ref 14–54)
AST: 24 U/L (ref 15–41)
Albumin: 3.2 g/dL — ABNORMAL LOW (ref 3.5–5.0)
Anion gap: 8 (ref 5–15)
BUN: 7 mg/dL (ref 6–20)
CALCIUM: 9.9 mg/dL (ref 8.9–10.3)
CHLORIDE: 103 mmol/L (ref 101–111)
CO2: 29 mmol/L (ref 22–32)
CREATININE: 1 mg/dL (ref 0.44–1.00)
GFR calc non Af Amer: >60 mL/min (ref >60)
Glucose, Bld: 110 mg/dL — ABNORMAL HIGH (ref 65–99)
Potassium: 4.2 mmol/L (ref 3.5–5.1)
SODIUM: 140 mmol/L (ref 135–145)
Total Bilirubin: 0.7 mg/dL (ref 0.3–1.2)
Total Protein: 7.6 g/dL (ref 6.5–8.1)

## 2015-03-07 LAB — CBC WITH DIFFERENTIAL/PLATELET
BASOS ABS: 0 10*3/uL (ref 0.0–0.1)
Basophils Relative: 0 %
EOS PCT: 2 %
Eosinophils Absolute: 0.3 10*3/uL (ref 0.0–0.7)
HCT: 31.7 % — ABNORMAL LOW (ref 36.0–46.0)
HEMOGLOBIN: 9.8 g/dL — AB (ref 12.0–15.0)
LYMPHS ABS: 2.3 10*3/uL (ref 0.7–4.0)
Lymphocytes Relative: 18 %
MCH: 26.8 pg (ref 26.0–34.0)
MCHC: 30.9 g/dL (ref 30.0–36.0)
MCV: 86.8 fL (ref 78.0–100.0)
MONO ABS: 1.8 10*3/uL — AB (ref 0.1–1.0)
MONOS PCT: 14 %
NEUTROS PCT: 66 %
Neutro Abs: 8.5 10*3/uL — ABNORMAL HIGH (ref 1.7–7.7)
PLATELETS: 399 10*3/uL (ref 150–400)
RBC: 3.65 MIL/uL — AB (ref 3.87–5.11)
RDW: 14.9 % (ref 11.5–15.5)
WBC: 12.9 10*3/uL — AB (ref 4.0–10.5)

## 2015-03-07 LAB — URINE MICROSCOPIC-ADD ON

## 2015-03-07 LAB — URINALYSIS, ROUTINE W REFLEX MICROSCOPIC
Bilirubin Urine: NEGATIVE
Glucose, UA: NEGATIVE mg/dL
Ketones, ur: NEGATIVE mg/dL
NITRITE: NEGATIVE
PROTEIN: NEGATIVE mg/dL
SPECIFIC GRAVITY, URINE: 1.016 (ref 1.005–1.030)
pH: 6 (ref 5.0–8.0)

## 2015-03-07 LAB — PREPARE RBC (CROSSMATCH)

## 2015-03-07 LAB — PREGNANCY, URINE: Preg Test, Ur: NEGATIVE

## 2015-03-07 LAB — ABO/RH: ABO/RH(D): A POS

## 2015-03-07 NOTE — Progress Notes (Signed)
Patient seen while in Pre-op testing:  HPI: 49yo AAF with PMH significant for hypertension (not currently on medication; SBP 150s recently), BMI of 60, GERD controlled with Zantac presenting for preop testing prior to TAH, BSSO on 03/13/15 for a new diagnosis of a complex pelvic mass found incidentally at the time of an admission for pyelonephritis and AKI.  Patient denies history of trouble with anesthesia.  Patient denies CP, SOB, CAD, lung disease, diabetes, thyroid disease, strokes, seizures, sickle cell anemia.  PE: Morbidly obese female Airway: Dentition intact, Mallampati 2, FROM neck Chest: RRR, CTAB  Assessment: -OK for surgery -Preop labs  Hoy Morn, MD Anesthesiology

## 2015-03-07 NOTE — Progress Notes (Signed)
Spoke with Vicente Males in [employer] in regards to order for Bariatric Bed. Order placed. Derek aware of surgical information.

## 2015-03-07 NOTE — Progress Notes (Signed)
Dr Constance Goltz in to see pt.

## 2015-03-07 NOTE — Progress Notes (Signed)
CBCD, urinalysis and micro results in epic per PAT visit 03/07/2015 sent to Dr Denman George

## 2015-03-08 NOTE — Telephone Encounter (Signed)
Completed and placed in Tamika's box. 

## 2015-03-09 NOTE — Telephone Encounter (Signed)
Patient informed that forms are complete and ready for pickup.  Forms copied for scanning in patient's record.  Quirino Kakos L, RN  

## 2015-03-13 ENCOUNTER — Inpatient Hospital Stay (HOSPITAL_COMMUNITY): Payer: 59 | Admitting: Anesthesiology

## 2015-03-13 ENCOUNTER — Inpatient Hospital Stay (HOSPITAL_COMMUNITY)
Admission: AD | Admit: 2015-03-13 | Discharge: 2015-03-16 | DRG: 737 | Disposition: A | Discharge status: Home / Self Care | Payer: 59 | Source: Ambulatory Visit | Attending: Gynecologic Oncology | Admitting: Gynecologic Oncology

## 2015-03-13 ENCOUNTER — Encounter (HOSPITAL_COMMUNITY): Payer: Self-pay | Admitting: *Deleted

## 2015-03-13 ENCOUNTER — Encounter (HOSPITAL_COMMUNITY)
Admission: AD | Disposition: A | Discharge status: Home / Self Care | Payer: Self-pay | Source: Ambulatory Visit | Attending: Gynecologic Oncology

## 2015-03-13 DIAGNOSIS — D259 Leiomyoma of uterus, unspecified: Secondary | ICD-10-CM | POA: Diagnosis present

## 2015-03-13 DIAGNOSIS — E875 Hyperkalemia: Secondary | ICD-10-CM | POA: Diagnosis present

## 2015-03-13 DIAGNOSIS — C561 Malignant neoplasm of right ovary: Principal | ICD-10-CM | POA: Diagnosis present

## 2015-03-13 DIAGNOSIS — Z833 Family history of diabetes mellitus: Secondary | ICD-10-CM

## 2015-03-13 DIAGNOSIS — D3910 Neoplasm of uncertain behavior of unspecified ovary: Secondary | ICD-10-CM

## 2015-03-13 DIAGNOSIS — K439 Ventral hernia without obstruction or gangrene: Secondary | ICD-10-CM | POA: Diagnosis present

## 2015-03-13 DIAGNOSIS — K660 Peritoneal adhesions (postprocedural) (postinfection): Secondary | ICD-10-CM | POA: Diagnosis present

## 2015-03-13 DIAGNOSIS — Z6841 Body Mass Index (BMI) 40.0 and over, adult: Secondary | ICD-10-CM

## 2015-03-13 DIAGNOSIS — N839 Noninflammatory disorder of ovary, fallopian tube and broad ligament, unspecified: Secondary | ICD-10-CM | POA: Diagnosis present

## 2015-03-13 DIAGNOSIS — K219 Gastro-esophageal reflux disease without esophagitis: Secondary | ICD-10-CM | POA: Diagnosis present

## 2015-03-13 DIAGNOSIS — R1900 Intra-abdominal and pelvic swelling, mass and lump, unspecified site: Secondary | ICD-10-CM

## 2015-03-13 DIAGNOSIS — Z79899 Other long term (current) drug therapy: Secondary | ICD-10-CM

## 2015-03-13 DIAGNOSIS — I10 Essential (primary) hypertension: Secondary | ICD-10-CM | POA: Diagnosis present

## 2015-03-13 DIAGNOSIS — N838 Other noninflammatory disorders of ovary, fallopian tube and broad ligament: Secondary | ICD-10-CM

## 2015-03-13 HISTORY — PX: BOWEL RESECTION: SHX1257

## 2015-03-13 HISTORY — PX: ABDOMINAL HYSTERECTOMY: SHX81

## 2015-03-13 HISTORY — PX: VENTRAL HERNIA REPAIR: SHX424

## 2015-03-13 HISTORY — PX: LAPAROTOMY: SHX154

## 2015-03-13 LAB — CBC
HCT: 26.2 % — ABNORMAL LOW (ref 36.0–46.0)
HEMOGLOBIN: 8.1 g/dL — AB (ref 12.0–15.0)
MCH: 27.3 pg (ref 26.0–34.0)
MCHC: 30.9 g/dL (ref 30.0–36.0)
MCV: 88.2 fL (ref 78.0–100.0)
PLATELETS: 380 10*3/uL (ref 150–400)
RBC: 2.97 MIL/uL — ABNORMAL LOW (ref 3.87–5.11)
RDW: 15.3 % (ref 11.5–15.5)
WBC: 12.9 10*3/uL — ABNORMAL HIGH (ref 4.0–10.5)

## 2015-03-13 LAB — PREPARE RBC (CROSSMATCH)

## 2015-03-13 SURGERY — LAPAROTOMY, EXPLORATORY
Anesthesia: General

## 2015-03-13 MED ORDER — GABAPENTIN 600 MG PO TABS
600.0000 mg | ORAL_TABLET | Freq: Every day | ORAL | Status: DC
  Filled 2015-03-13: qty 1

## 2015-03-13 MED ORDER — METRONIDAZOLE IN NACL 5-0.79 MG/ML-% IV SOLN
INTRAVENOUS | Status: AC
  Filled 2015-03-13: qty 100

## 2015-03-13 MED ORDER — 0.9 % SODIUM CHLORIDE (POUR BTL) OPTIME
TOPICAL | Status: DC | PRN
  Administered 2015-03-13: 3000 mL

## 2015-03-13 MED ORDER — PROMETHAZINE HCL 25 MG/ML IJ SOLN: 6.2500 mg | INTRAMUSCULAR | Status: DC | PRN

## 2015-03-13 MED ORDER — ATROPINE SULFATE 0.4 MG/ML IJ SOLN
INTRAMUSCULAR | Status: AC
Start: 2015-03-13 — End: 2015-03-13
  Filled 2015-03-13: qty 2

## 2015-03-13 MED ORDER — SODIUM CHLORIDE 0.9 % IJ SOLN
INTRAMUSCULAR | Status: AC
  Filled 2015-03-13: qty 10

## 2015-03-13 MED ORDER — LACTATED RINGERS IV SOLN: INTRAVENOUS | Status: DC

## 2015-03-13 MED ORDER — ONDANSETRON HCL 4 MG PO TABS
4.0000 mg | ORAL_TABLET | Freq: Four times a day (QID) | ORAL | Status: DC | PRN
Start: 2015-03-13 — End: 2015-03-16

## 2015-03-13 MED ORDER — ENOXAPARIN SODIUM 40 MG/0.4ML ~~LOC~~ SOLN
40.0000 mg | SUBCUTANEOUS | Status: DC
  Administered 2015-03-14 – 2015-03-16 (×3): 40 mg via SUBCUTANEOUS
  Filled 2015-03-13 (×4): qty 0.4

## 2015-03-13 MED ORDER — ONDANSETRON HCL 4 MG/2ML IJ SOLN
INTRAMUSCULAR | Status: AC
  Filled 2015-03-13: qty 2

## 2015-03-13 MED ORDER — MEPERIDINE HCL 50 MG/ML IJ SOLN: 6.2500 mg | INTRAMUSCULAR | Status: DC | PRN

## 2015-03-13 MED ORDER — ACETAMINOPHEN 10 MG/ML IV SOLN
1000.0000 mg | Freq: Four times a day (QID) | INTRAVENOUS | Status: DC
  Administered 2015-03-13: 1000 mg via INTRAVENOUS

## 2015-03-13 MED ORDER — ROCURONIUM BROMIDE 100 MG/10ML IV SOLN
INTRAVENOUS | Status: DC | PRN
  Administered 2015-03-13: 5 mg via INTRAVENOUS
  Administered 2015-03-13: 50 mg via INTRAVENOUS
  Administered 2015-03-13 (×2): 10 mg via INTRAVENOUS
  Administered 2015-03-13: 20 mg via INTRAVENOUS

## 2015-03-13 MED ORDER — PHENYLEPHRINE HCL 10 MG/ML IJ SOLN
INTRAMUSCULAR | Status: DC | PRN
  Administered 2015-03-13: 80 ug via INTRAVENOUS
  Administered 2015-03-13: 40 ug via INTRAVENOUS
  Administered 2015-03-13: 80 ug via INTRAVENOUS

## 2015-03-13 MED ORDER — HYDROMORPHONE HCL 2 MG/ML IJ SOLN
INTRAMUSCULAR | Status: AC
  Filled 2015-03-13: qty 1

## 2015-03-13 MED ORDER — ONDANSETRON HCL 4 MG/2ML IJ SOLN
4.0000 mg | Freq: Four times a day (QID) | INTRAMUSCULAR | Status: DC | PRN
Start: 2015-03-13 — End: 2015-03-16
  Administered 2015-03-14 (×3): 4 mg via INTRAVENOUS
  Filled 2015-03-13 (×3): qty 2

## 2015-03-13 MED ORDER — CLINDAMYCIN PHOSPHATE 900 MG/50ML IV SOLN
900.0000 mg | INTRAVENOUS | Status: AC
  Administered 2015-03-13: 900 mg via INTRAVENOUS

## 2015-03-13 MED ORDER — LIDOCAINE HCL (CARDIAC) 20 MG/ML IV SOLN
INTRAVENOUS | Status: AC
  Filled 2015-03-13: qty 5

## 2015-03-13 MED ORDER — LIDOCAINE HCL (CARDIAC) 20 MG/ML IV SOLN
INTRAVENOUS | Status: DC | PRN
  Administered 2015-03-13: 100 mg via INTRAVENOUS

## 2015-03-13 MED ORDER — ROCURONIUM BROMIDE 100 MG/10ML IV SOLN
INTRAVENOUS | Status: AC
  Filled 2015-03-13: qty 1

## 2015-03-13 MED ORDER — PROPOFOL 10 MG/ML IV BOLUS
INTRAVENOUS | Status: AC
  Filled 2015-03-13: qty 40

## 2015-03-13 MED ORDER — GABAPENTIN 300 MG PO CAPS
600.0000 mg | ORAL_CAPSULE | Freq: Every day | ORAL | Status: DC
  Administered 2015-03-13: 600 mg via ORAL
  Filled 2015-03-13 (×3): qty 2

## 2015-03-13 MED ORDER — MAGNESIUM HYDROXIDE 400 MG/5ML PO SUSP
30.0000 mL | Freq: Three times a day (TID) | ORAL | Status: AC
  Administered 2015-03-14: 30 mL via ORAL
  Filled 2015-03-13 (×3): qty 30

## 2015-03-13 MED ORDER — SUGAMMADEX SODIUM 500 MG/5ML IV SOLN
INTRAVENOUS | Status: DC | PRN
  Administered 2015-03-13: 350 mg via INTRAVENOUS

## 2015-03-13 MED ORDER — KCL IN DEXTROSE-NACL 20-5-0.45 MEQ/L-%-% IV SOLN
INTRAVENOUS | Status: DC
  Administered 2015-03-13 – 2015-03-14 (×2): via INTRAVENOUS
  Filled 2015-03-13 (×3): qty 1000

## 2015-03-13 MED ORDER — FENTANYL CITRATE (PF) 100 MCG/2ML IJ SOLN
INTRAMUSCULAR | Status: DC | PRN
  Administered 2015-03-13 (×5): 50 ug via INTRAVENOUS

## 2015-03-13 MED ORDER — ACETAMINOPHEN 10 MG/ML IV SOLN
INTRAVENOUS | Status: AC
  Filled 2015-03-13: qty 100

## 2015-03-13 MED ORDER — LACTATED RINGERS IV SOLN
INTRAVENOUS | Status: DC | PRN
  Administered 2015-03-13 (×2): via INTRAVENOUS

## 2015-03-13 MED ORDER — DEXTROSE 5 % IV SOLN
20.0000 mg | INTRAVENOUS | Status: DC | PRN
  Administered 2015-03-13: 10 ug/min via INTRAVENOUS

## 2015-03-13 MED ORDER — SODIUM CHLORIDE 0.9 % IJ SOLN
INTRAMUSCULAR | Status: AC
  Filled 2015-03-13: qty 20

## 2015-03-13 MED ORDER — MIDAZOLAM HCL 5 MG/5ML IJ SOLN
INTRAMUSCULAR | Status: DC | PRN
  Administered 2015-03-13: 2 mg via INTRAVENOUS

## 2015-03-13 MED ORDER — SUCCINYLCHOLINE CHLORIDE 20 MG/ML IJ SOLN
INTRAMUSCULAR | Status: DC | PRN
  Administered 2015-03-13: 120 mg via INTRAVENOUS

## 2015-03-13 MED ORDER — CIPROFLOXACIN IN D5W 400 MG/200ML IV SOLN
400.0000 mg | INTRAVENOUS | Status: AC
  Administered 2015-03-13: 400 mg via INTRAVENOUS

## 2015-03-13 MED ORDER — HYDROMORPHONE HCL 1 MG/ML IJ SOLN
0.5000 mg | INTRAMUSCULAR | Status: DC | PRN
  Administered 2015-03-14: 0.5 mg via INTRAVENOUS
  Filled 2015-03-13: qty 1

## 2015-03-13 MED ORDER — SODIUM CHLORIDE 0.9 % IV SOLN: Freq: Once | INTRAVENOUS | Status: DC

## 2015-03-13 MED ORDER — HYDROMORPHONE HCL 1 MG/ML IJ SOLN
INTRAMUSCULAR | Status: AC
  Filled 2015-03-13: qty 1

## 2015-03-13 MED ORDER — ENOXAPARIN SODIUM 40 MG/0.4ML ~~LOC~~ SOLN
40.0000 mg | SUBCUTANEOUS | Status: AC
  Administered 2015-03-13: 40 mg via SUBCUTANEOUS
  Filled 2015-03-13: qty 0.4

## 2015-03-13 MED ORDER — EPHEDRINE SULFATE 50 MG/ML IJ SOLN
INTRAMUSCULAR | Status: AC
  Filled 2015-03-13: qty 1

## 2015-03-13 MED ORDER — KETOROLAC TROMETHAMINE 30 MG/ML IJ SOLN
15.0000 mg | Freq: Four times a day (QID) | INTRAMUSCULAR | Status: DC
  Administered 2015-03-13 (×2): 15 mg via INTRAVENOUS
  Filled 2015-03-13 (×2): qty 1

## 2015-03-13 MED ORDER — CIPROFLOXACIN IN D5W 400 MG/200ML IV SOLN
INTRAVENOUS | Status: AC
  Filled 2015-03-13: qty 200

## 2015-03-13 MED ORDER — CLINDAMYCIN PHOSPHATE 900 MG/50ML IV SOLN
INTRAVENOUS | Status: AC
  Filled 2015-03-13: qty 50

## 2015-03-13 MED ORDER — KETOROLAC TROMETHAMINE 30 MG/ML IJ SOLN
INTRAMUSCULAR | Status: AC
  Filled 2015-03-13: qty 1

## 2015-03-13 MED ORDER — DEXAMETHASONE SODIUM PHOSPHATE 10 MG/ML IJ SOLN
INTRAMUSCULAR | Status: DC | PRN
  Administered 2015-03-13: 10 mg via INTRAVENOUS

## 2015-03-13 MED ORDER — MIDAZOLAM HCL 2 MG/2ML IJ SOLN
INTRAMUSCULAR | Status: AC
  Filled 2015-03-13: qty 2

## 2015-03-13 MED ORDER — ACETAMINOPHEN 500 MG PO TABS
1000.0000 mg | ORAL_TABLET | Freq: Four times a day (QID) | ORAL | Status: DC
  Administered 2015-03-13 – 2015-03-16 (×8): 1000 mg via ORAL
  Filled 2015-03-13 (×18): qty 2

## 2015-03-13 MED ORDER — DEXAMETHASONE SODIUM PHOSPHATE 10 MG/ML IJ SOLN
INTRAMUSCULAR | Status: AC
  Filled 2015-03-13: qty 1

## 2015-03-13 MED ORDER — HYDROMORPHONE HCL 1 MG/ML IJ SOLN
INTRAMUSCULAR | Status: DC | PRN
  Administered 2015-03-13: 1 mg via INTRAVENOUS
  Administered 2015-03-13 (×2): 0.5 mg via INTRAVENOUS

## 2015-03-13 MED ORDER — BUPIVACAINE LIPOSOME 1.3 % IJ SUSP
20.0000 mL | Freq: Once | INTRAMUSCULAR | Status: AC
  Administered 2015-03-13: 20 mL
  Filled 2015-03-13: qty 20

## 2015-03-13 MED ORDER — FENTANYL CITRATE (PF) 250 MCG/5ML IJ SOLN
INTRAMUSCULAR | Status: AC
  Filled 2015-03-13: qty 5

## 2015-03-13 MED ORDER — LACTATED RINGERS IV SOLN
INTRAVENOUS | Status: DC
  Administered 2015-03-13: 13:00:00 via INTRAVENOUS

## 2015-03-13 MED ORDER — KETOROLAC TROMETHAMINE 15 MG/ML IJ SOLN
15.0000 mg | Freq: Four times a day (QID) | INTRAMUSCULAR | Status: DC
  Filled 2015-03-13 (×4): qty 1

## 2015-03-13 MED ORDER — EPHEDRINE SULFATE 50 MG/ML IJ SOLN
INTRAMUSCULAR | Status: DC | PRN
  Administered 2015-03-13: 5 mg via INTRAVENOUS

## 2015-03-13 MED ORDER — PROPOFOL 10 MG/ML IV BOLUS
INTRAVENOUS | Status: DC | PRN
  Administered 2015-03-13: 250 mg via INTRAVENOUS

## 2015-03-13 MED ORDER — ONDANSETRON HCL 4 MG/2ML IJ SOLN
INTRAMUSCULAR | Status: DC | PRN
  Administered 2015-03-13: 4 mg via INTRAVENOUS

## 2015-03-13 MED ORDER — KETOROLAC TROMETHAMINE 15 MG/ML IJ SOLN
15.0000 mg | Freq: Four times a day (QID) | INTRAMUSCULAR | Status: DC
  Administered 2015-03-14 (×2): 15 mg via INTRAVENOUS
  Filled 2015-03-13 (×6): qty 1

## 2015-03-13 MED ORDER — SUGAMMADEX SODIUM 500 MG/5ML IV SOLN
INTRAVENOUS | Status: AC
  Filled 2015-03-13: qty 5

## 2015-03-13 MED ORDER — HYDROMORPHONE HCL 1 MG/ML IJ SOLN
0.2500 mg | INTRAMUSCULAR | Status: DC | PRN
  Administered 2015-03-13: 0.25 mg via INTRAVENOUS
  Administered 2015-03-13: 0.5 mg via INTRAVENOUS
  Administered 2015-03-13: 0.25 mg via INTRAVENOUS
  Administered 2015-03-13 (×4): 0.5 mg via INTRAVENOUS

## 2015-03-13 MED ORDER — KETOROLAC TROMETHAMINE 30 MG/ML IJ SOLN: 15.0000 mg | Freq: Four times a day (QID) | INTRAMUSCULAR | Status: DC

## 2015-03-13 MED ORDER — OXYCODONE HCL 5 MG PO TABS
10.0000 mg | ORAL_TABLET | ORAL | Status: DC | PRN
  Administered 2015-03-13 – 2015-03-15 (×3): 10 mg via ORAL
  Filled 2015-03-13 (×3): qty 2

## 2015-03-13 MED ORDER — METRONIDAZOLE IN NACL 5-0.79 MG/ML-% IV SOLN
500.0000 mg | Freq: Once | INTRAVENOUS | Status: AC
  Administered 2015-03-13: 500 mg via INTRAVENOUS

## 2015-03-13 MED ORDER — PHENYLEPHRINE 40 MCG/ML (10ML) SYRINGE FOR IV PUSH (FOR BLOOD PRESSURE SUPPORT)
PREFILLED_SYRINGE | INTRAVENOUS | Status: AC
  Filled 2015-03-13: qty 10

## 2015-03-13 MED ORDER — ENSURE ENLIVE PO LIQD
237.0000 mL | Freq: Two times a day (BID) | ORAL | Status: DC
  Administered 2015-03-13 – 2015-03-16 (×5): 237 mL via ORAL

## 2015-03-13 SURGICAL SUPPLY — 58 items
ATTRACTOMAT 16X20 MAGNETIC DRP (DRAPES) ×3 IMPLANT
BLADE EXTENDED COATED 6.5IN (ELECTRODE) ×3 IMPLANT
CHLORAPREP W/TINT 26ML (MISCELLANEOUS) ×4 IMPLANT
CLIP TI MEDIUM 6 (CLIP) ×5 IMPLANT
CLIP TI MEDIUM LARGE 6 (CLIP) ×2 IMPLANT
CONT SPEC 4OZ CLIKSEAL STRL BL (MISCELLANEOUS) ×3 IMPLANT
COVER SURGICAL LIGHT HANDLE (MISCELLANEOUS) ×3 IMPLANT
DRAPE INCISE IOBAN 66X45 STRL (DRAPES) ×3 IMPLANT
DRAPE UTILITY 15X26 (DRAPE) ×3 IMPLANT
DRAPE WARM FLUID 44X44 (DRAPE) ×3 IMPLANT
DRESSING TELFA ISLAND 4X8 (GAUZE/BANDAGES/DRESSINGS) ×2 IMPLANT
DRSG OPSITE POSTOP 4X10 (GAUZE/BANDAGES/DRESSINGS) IMPLANT
DRSG OPSITE POSTOP 4X12 (GAUZE/BANDAGES/DRESSINGS) ×1 IMPLANT
DRSG OPSITE POSTOP 4X8 (GAUZE/BANDAGES/DRESSINGS) IMPLANT
ELECT LIGASURE SHORT 9 REUSE (ELECTRODE) IMPLANT
ELECT REM PT RETURN 9FT ADLT (ELECTROSURGICAL) ×3
ELECTRODE REM PT RTRN 9FT ADLT (ELECTROSURGICAL) ×2 IMPLANT
GAUZE SPONGE 4X4 12PLY STRL (GAUZE/BANDAGES/DRESSINGS) ×2 IMPLANT
GAUZE SPONGE 4X4 16PLY XRAY LF (GAUZE/BANDAGES/DRESSINGS) ×1 IMPLANT
GLOVE BIO SURGEON STRL SZ 6 (GLOVE) ×7 IMPLANT
GLOVE BIO SURGEON STRL SZ 6.5 (GLOVE) ×7 IMPLANT
GOWN STRL REUS W/ TWL LRG LVL3 (GOWN DISPOSABLE) ×4 IMPLANT
GOWN STRL REUS W/TWL LRG LVL3 (GOWN DISPOSABLE) ×12
KIT BASIN OR (CUSTOM PROCEDURE TRAY) ×3 IMPLANT
LIGASURE IMPACT 36 18CM CVD LR (INSTRUMENTS) ×1 IMPLANT
LIQUID BAND (GAUZE/BANDAGES/DRESSINGS) IMPLANT
LOOP VESSEL MAXI BLUE (MISCELLANEOUS) IMPLANT
NEEDLE HYPO 22GX1.5 SAFETY (NEEDLE) ×6 IMPLANT
NS IRRIG 1000ML POUR BTL (IV SOLUTION) ×8 IMPLANT
PACK GENERAL/GYN (CUSTOM PROCEDURE TRAY) ×3 IMPLANT
RELOAD LINEAR CUT PROX 55 BLUE (ENDOMECHANICALS) ×6 IMPLANT
RELOAD PROXIMATE TA60MM BLUE (ENDOMECHANICALS) ×3 IMPLANT
RELOAD STAPLE 55 3.8 BLU REG (ENDOMECHANICALS) IMPLANT
RELOAD STAPLE 60 BLU REG PROX (ENDOMECHANICALS) IMPLANT
SHEET LAVH (DRAPES) ×3 IMPLANT
SPONGE LAP 18X18 X RAY DECT (DISPOSABLE) ×4 IMPLANT
STAPLER GUN LINEAR PROX 60 (STAPLE) ×1 IMPLANT
STAPLER PROXIMATE 55 BLUE (STAPLE) ×1 IMPLANT
STAPLER VISISTAT 35W (STAPLE) ×3 IMPLANT
SUT MNCRL AB 4-0 PS2 18 (SUTURE) IMPLANT
SUT PDS AB 1 TP1 96 (SUTURE) ×6 IMPLANT
SUT PROLENE 5 0 CC 1 (SUTURE) IMPLANT
SUT VIC AB 0 CT1 36 (SUTURE) ×11 IMPLANT
SUT VIC AB 2-0 CT1 36 (SUTURE) ×6 IMPLANT
SUT VIC AB 2-0 CT2 27 (SUTURE) ×25 IMPLANT
SUT VIC AB 2-0 SH 27 (SUTURE) ×3
SUT VIC AB 2-0 SH 27X BRD (SUTURE) ×4 IMPLANT
SUT VIC AB 3-0 CTX 36 (SUTURE) IMPLANT
SUT VIC AB 3-0 SH 27 (SUTURE) ×36
SUT VIC AB 3-0 SH 27XBRD (SUTURE) IMPLANT
SUT VICRYL 2 0 18  UND BR (SUTURE)
SUT VICRYL 2 0 18 UND BR (SUTURE) ×2 IMPLANT
SYR 20CC LL (SYRINGE) ×6 IMPLANT
TOWEL OR 17X26 10 PK STRL BLUE (TOWEL DISPOSABLE) ×6 IMPLANT
TOWEL OR NON WOVEN STRL DISP B (DISPOSABLE) ×3 IMPLANT
TRAY FOLEY W/METER SILVER 14FR (SET/KITS/TRAYS/PACK) ×3 IMPLANT
TRAY FOLEY W/METER SILVER 16FR (SET/KITS/TRAYS/PACK) ×2 IMPLANT
WATER STERILE IRR 1500ML POUR (IV SOLUTION) ×2 IMPLANT

## 2015-03-13 NOTE — Anesthesia Postprocedure Evaluation (Signed)
Anesthesia Post Note  Patient: [name]  Procedure(s) Performed: Procedure(s) (LRB): EXPLORATORY LAPAROTOMY (N/A) TOTAL HYSTERECTOMY ABDOMINAL BILATERAL SALPINGO OOPHORECTOMY RADICAL TUMOR DEBUKING (N/A) SMALL BOWEL RESECTION HERNIA REPAIR VENTRAL ADULT  Patient location during evaluation: PACU Anesthesia Type: General Level of consciousness: awake and alert Pain management: pain level controlled Vital Signs Assessment: post-procedure vital signs reviewed and stable Respiratory status: spontaneous breathing, nonlabored ventilation, respiratory function stable and patient connected to nasal cannula oxygen Cardiovascular status: blood pressure returned to baseline and stable Postop Assessment: no signs of nausea or vomiting Anesthetic complications: no    Last Vitals:  Filed Vitals:   03/13/15 1315 03/13/15 1330  BP: 118/66 117/67  Pulse: 88 87  Temp:    Resp: 12 15    Last Pain:  Filed Vitals:   03/13/15 1333  PainSc: 9                  Montez Hageman

## 2015-03-13 NOTE — Op Note (Signed)
OPERATIVE NOTE  03/13/15  Preoperative Diagnosis: 1. Adnexal masses.   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 Exploratory laparotomy with total abdominal hysterectomy bilateral salpingo-oophorectomy, small bowel resection, radical tumor debulking for ovarian cancer and ventral hernia repair.  Surgeon: Thereasa Solo, MD.  Assistant Surgeon: Dr Lahoma Crocker, M.D. Assistant: (an MD assistant was necessary for tissue manipulation, retraction and positioning due to the complexity of the case and hospital policies).   Specimens: Washings, bilateral tubes / ovaries, segment of ileum.    Estimated Blood Loss: 1050 mL.    Urine Output:200cc IVF: 4000cc crystalloid Complications: None.   Operative Findings: Extreme morbid obesity with BMI of 66 kg there are venous squared, dense adhesions between omentum, small and large intestine, and anterior abdominal wall, 4 cm infraumbilical midline ventral hernia with hernia sac containing omentum, smaller 2 cm periumbilical ventral hernia containing omental fat in sac. 15 cm solid right ovarian mass with dense adhesion to midportion of ileum. 5 cm complex solid masses on left ovary. Right lower uterine segment Iona. 6 cm tumor implant on anterior uterine wall embedded within the dome of bladder and anterior peritoneum of pelvis. Grossly normal large intestine and omentum. Normal diaphragms and liver surface. Small volume of free fluid upon entry into perineal cavity.   This represented an optimal cytoreduction (R)) with no gross visible disease remaining at completion of case.   Procedure:   The patient was seen in the Holding Room. The risks, benefits, complications, treatment options, and expected outcomes were discussed with the patient.  The patient concurred with the proposed plan, giving informed consent.   The patient was  identified as [name]  and the procedure  verified as TAH BSO, omentectomy, tumor debulking. A Time Out was held and the above information confirmed upon entry to the operating room.  After induction of anesthesia, the patient was draped and prepped in the usual sterile manner.  She was prepped and draped in the normal sterile fashion in the dorsal lithotomy position in padded Allen stirrups with good attention paid to support of the lower back and lower extremities. Position was adjusted for appropriate support. A Foley catheter was placed to gravity.   A midline vertical incision was made and carried through the subcutaneous tissue to the fascia. The fascial incision was made and extended superiorally with the scalpel. The rectus muscles were separated. The peritoneum was carefully identified and entered sharply. Dense adhesions between the small and large intestine and omentum any anterior peritoneum were encountered. These were taken down with meticulous sharp lysis of adhesions using Metzenbaum scissors for approximately 1 hour.. Peritoneal incision was extended longitudinally.  A Bookwalter retractor was then placed. A survey of the abdomen and pelvis revealed the above findings, which were significant for a 15-20 cm solid right ovarian mass adherent to the midportion of the ileum. An enlarged fibroid uterus of approximately 16 weeks' size with tumor either erupting through the anterior uterine wall or involving an anterior serosal implant that was embedded into the midline dome of the bladder. The left ovary was enlarged with tumor also. The remainder of the peritoneal cavity was grossly free of cancer.   After packing the small bowel into the upper abdomen, we performed the right salpingo-oophorectomy by entering the  pelvic sidewall just posterior to the right round ligament. The pararectal space was developed and the retroperitoneum developed up to the level of the common iliac artery.  The  course of the ureter was identified with ease. The  right IP was then skeletonized, and skeletonized and transected using the LigaSure bipolar electrosurgical device. There were dense adhesions noted between the midportion of the ileum and the right ovary. It was not possible separate the ileum from the ovary as it was apparently involved with tumor extension. A decision was made to resect this portion of ileum measuring a proximally 15 cm. A window was made in the mesentery adjacent to the bowel wall at the afferent and efferent limbs of the resection portion. A 55 mm GIA stapler was placed through this window clamped and deployed. This was repeated on the efferent . Thrent limbe mesentery connecting. The ovary was separated from its peritoneal attachments with the bovie with visualization of the ureter at all times. The ovary was amputated from its uterine attatchments using the ligasure. It was sent for frozen section which revealed granulosa cell tumor  The sigmoid colon was dissected from its dense tumor attachments to the left ovary using sharp dissection. The left retroperitoneal peritoneum was entered parallel to the sigmoid colon attachments and the left ureter was identified in the left retroperitoneal space. Using sharp and monopolar dissection, the left tube and ovary were freed from their peritoneal adhesions to the pelvis and sigmoid colon. The IP ligament was sealed with the ligasure and transected.   The anterior peritoneum was carefully dissected free from the anterior uterine nature cul-de-sac tumor mass using monopolar and sharp Metzenbaum scissor dissection. The mass was Contiguous with the uterine specimen. The lower uterine segment and cervical fascia was appreciated in the bladder was mobilized in avascular way to level beyond the cervicovaginal junction. The uterine vessels were skeletonized at the level of uterine isthmus bilaterally. This was challenging on the right side where there was a lower uterine segment myoma abutting and  projecting into the right pelvic space. The curved parametrial cancer placed bilaterally across the uterine vessels above the level of the identified retroperitoneal ureters. The vascular pedicles would transected and suture ligated. Successive passes using straight clamps stomach cardinal ligaments was then made. These pedicles were made hemostatic with suture ligatures. Due to the patient's extreme morbid obesity and long length of cervix this portion of the hysterectomy took approximately one hour which was twice normal anticipated time. From such a portion of this procedure. At the level of the cervicovaginal junction curved parametrial clamps were placed across the corners of the vagina. The vagina was then transected with sharp scissors separating the uterus and cervix and left tube and ovary and attached anterior tumor nodule from its attachments to the vagina. 0 Vicryl suture was used to make vaginal corners hemostatic. The vaginal cuff was then closed in a hemostatic fashion with figure-of-eight 0 Vicryl sutures. Copious irrigation took place of the peritoneal cavity and hemostasis was observed at all surgical sites.  The perineal cavity was extensively evaluated normal adhesive disease taken L sharp dissection. The bowel was carefully inspected throughout to ensure no serosal or I transmural preaches had taken place. There was no residual tumor appreciated that was macroscopic in the upper abdomen. There's no residual omentum to resect from her prior surgeries.  The ileum was reanastomosed primarily using a side to side anastomosis with staplers. The afferent and different limbs of the small intestine were aligned spelled to each other. The corners of the staple lines were resected. The 55 mm GA stapler was placed through these afferent and different limbs. The stapler was clamped, held,  then deployed to create a ligament between the afferent knee for limbs. The suture lines were offset and inspected  for hemostasis which was noted. The opening to the bowel anastomosis was then closed with a transverse stapling device. It was noted to be oozing at the staple line and this was oversewn with 3-0 Vicryl to make hemostatic. Mesenteric window was then reapproximated with interrupted 3-0 Vicryl sutures. The lumen of the new anastomosis was palpated noted to be wide open. The peroneal cavity was copiously irrigated with 2 L of warm saline and evacuated.  The umbilical and ventral hernia sacs were carefully resected using monopolar and sharp dissection. The fascial edges of these of hernia were sharply resected and mobilization of the fascia underneath the subcutaneous fat to place to ensure adequate closure.  The fascia was reapproximated with #1 looped PDS using a total of two sutures. The subcutaneous layer was then irrigated copiously.  Exparel long acting local anesthetic was infiltrated into the subcutaneous tissues. The deep sub Q fat was reapproximated with interrupted 3-0 vicryl. The skin was closed with staples. The patient tolerated the procedure well.   Sponge, lap and needle counts were correct x 2.   Donaciano Eva, MD

## 2015-03-13 NOTE — H&P (View-Only) (Signed)
Followup of Inpatient Consult: Gyn-Onc  CC:  Chief Complaint  Patient presents with  . Ovarian Mass    New patient    Assessment/Plan:  [name]  is a 49 y.o.  year old with a new diagnosis of a complex pelvic mass found incidentally at the time of an admission for pyelonephritis and AKI. She is morbidly obese with a BMI of 62kg/m2.  I am recommending TAH, BSO and possible debulking. We discussed surgical risk including  bleeding, infection, damage to internal organs (such as bladder,ureters, bowels), blood clot, reoperation and rehospitalization. I discussed that she is at higher risk for complication given her extreme morbid obesity and her prior surgical history including ex lap with sigmoid colectomy and colostomy takedown for perforated diverticular disease.  She will need a type and cross as she is anemic.  We will provide her with prophylaxis for colonic surgery given her high risk for needing colonic surgery (given her prior surgery and certain adhesions).   HPI: [name] is a very pleasant 49 year old woman with history of 2 prior cesarean sections who is seen initially in the hospital as an inpatient consultation at [facility] on 02/19/2015 for complex pelvic masses. The patient is a morbidly obese woman with a BMI 62 kg/m. She was admitted to [facility] on 02/18/2015 with pyelonephritis. CT imaging and then MRI imaging was performed to evaluate for an etiology for pyelonephritis. This identified A large pelvic mass which it to be arising from the right adnexa. It measured 13 x 9 x 17.5 cm and was irregular cystic and solid in nature. A second, but cystic and solid lesion within the left adnexa measuring 4.4 x 6.5 x 5.3 cm. In the third cystic and solid mass was seen anterior to the uterus and superior to the bladder measuring 4.2 x 5.4 x 5.3 cm. She had no pelvic lymphadenopathy, no carcinomatosis, and CA 125 was normal at 21U/mL.  Her infection was adequately  treated with IV antibiotics and her acute kidney injury which manifested with an elevated creatinine 12.4 spontaneously resolved during her hospital stay with avoidance of NSAID and good hydration. She was noted to be anemic during her hospitalization with a hemoglobin of 10.8. However this did not require transfusion and spontaneously improved over time.   Interval History: Since discharge she has been feeling much better. She denies vaginal bleeding.  Current Meds:  Outpatient Encounter Prescriptions as of 02/28/2015  Medication Sig  . acetaminophen (TYLENOL) 500 MG tablet Take 1,000 mg by mouth 2 (two) times daily as needed for moderate pain.  . clobetasol cream (TEMOVATE) 0.05 % Apply topically 2 (two) times daily. (Patient taking differently: Apply 1 application topically 2 (two) times daily. )  . ranitidine (ZANTAC) 150 MG tablet Take 1 tablet (150 mg total) by mouth at bedtime.   No facility-administered encounter medications on file as of 02/28/2015.    Allergy:  Allergies  Allergen Reactions  . Penicillins Rash    Has patient had a PCN reaction causing immediate rash, facial/tongue/throat swelling, SOB or lightheadedness with hypotension: no Has patient had a PCN reaction causing severe rash involving mucus membranes or skin necrosis: no Has patient had a PCN reaction that required hospitalization in the hospital at the time Has patient had a PCN reaction occurring within the last 10 years: no If all of the above answers are "NO", then may proceed with Cephalosporin use.     Social Hx:   Social History   Social  History  . Marital Status: Legally Separated    Spouse Name: N/A  . Number of Children: N/A  . Years of Education: N/A   Occupational History  . Not on file.   Social History Main Topics  . Smoking status: Never Smoker   . Smokeless tobacco: Not on file  . Alcohol Use: 0.0 oz/week     Comment: Maybe wine every 6 months  . Drug Use: No  . Sexual Activity:  Yes     Comment: Told she could not have more children in 1986   Other Topics Concern  . 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   Married x 23 years, recently separated (4/15).  No domestic violence.  + financial stress.     Lives previously with husband who moved out, now with daughter who has medical problems, including Hodgkin's disease, and granddaughter (age 24).     Uses seat belt.     Past Surgical Hx:  Past Surgical History  Procedure Laterality Date  . Colostomy takedown    . Colostomy  2008  . Cesarean section  1983 and 1984  . Tonsillectomy and adenoidectomy  1997    Past Medical Hx:  Past Medical History  Diagnosis Date  . Hypertension   . Hx of migraines   . IBS (irritable bowel syndrome)   . Diverticulitis with perforation 2010    Past Gynecological History:  C/s x 2  No LMP recorded. Patient is not currently having periods (Reason: Premenopausal).  Family Hx:  Family History  Problem Relation Age of Onset  . Diabetes Mother   . Hypertension Mother   . Hyperlipidemia Mother   . Hyperlipidemia Father   . Cancer Daughter 57    Hodgkins  . Diabetes Daughter   . Heart disease Neg Hx   . Stroke Neg Hx     Review of Systems:  Constitutional  Feels well,    ENT Normal appearing ears and nares bilaterally Skin/Breast  No rash, sores, jaundice, itching, dryness Cardiovascular  No chest pain, shortness of breath, or edema  Pulmonary  No cough or wheeze.  Gastro Intestinal  No nausea, vomitting, or diarrhoea. No bright red blood per rectum, +abdominal pain, nochange in bowel movement, or constipation.  Genito Urinary  No frequency, urgency, dysuria,  Musculo Skeletal  No myalgia, arthralgia, joint swelling or pain  Neurologic  No weakness, numbness, change in gait,  Psychology  No depression, anxiety, insomnia.   Vitals:   Blood pressure 147/88, pulse 90, temperature 97.9 F (36.6 C), temperature source Oral, height 5' 5.5" (1.664 m), weight 375 lb 12.8 oz (170.462 kg).  Physical Exam: WD in NAD Neck  Supple NROM, without any enlargements.  Lymph Node Survey No cervical supraclavicular or inguinal adenopathy Cardiovascular  Pulse normal rate, regularity and rhythm. S1 and S2 normal.  Lungs  Clear to auscultation bilateraly, without wheezes/crackles/rhonchi. Good air movement.  Skin  No rash/lesions/breakdown  Psychiatry  Alert and oriented to person, place, and time  Abdomen  Normoactive bowel sounds, abdomen soft, non-tender and obese without evidence of hernia. There is a large midline incision and colstomy site that are puckered but well healed Back No CVA tenderness Genito Urinary  Vulva/vagina: Normal external female genitalia.  No lesions. No discharge or bleeding.  Bladder/urethra:  No lesions or masses, well supported bladder  Vagina:  normal  Cervix: Normal appearing, no lesions.  Uterus: Small, mobile, no parametrial involvement or nodularity.  Adnexa: no palpable masses. Rectal  Good tone, no masses no cul de sac nodularity.  Extremities  No bilateral cyanosis, clubbing or edema.   Donaciano Eva, MD  02/28/2015, 3:39 PM

## 2015-03-13 NOTE — Interval H&P Note (Signed)
History and Physical Interval Note:  03/13/2015 7:20 AM  [name]  has presented today for surgery, with the diagnosis of OVARIAN MASS  The various methods of treatment have been discussed with the patient and family. After consideration of risks, benefits and other options for treatment, the patient has consented to  Procedure(s): EXPLORATORY LAPAROTOMY (N/A) TOTAL HYSTERECTOMY ABDOMINALWITH BILATERAL SALPINGO OOPHORECTOMY POSSIBLE STAGING (N/A) as a surgical intervention .  The patient's history has been reviewed, patient examined, no change in status, stable for surgery.  I have reviewed the patient's chart and labs.  Questions were answered to the patient's satisfaction.     Donaciano Eva

## 2015-03-13 NOTE — Anesthesia Preprocedure Evaluation (Addendum)
Anesthesia Evaluation  Patient identified by MRN, date of birth, ID band Patient awake    Reviewed: Allergy & Precautions, NPO status , Patient's Chart, lab work & pertinent test results  Airway Mallampati: II  TM Distance: >3 FB Neck ROM: Full    Dental no notable dental hx.    Pulmonary neg pulmonary ROS,    Pulmonary exam normal breath sounds clear to auscultation       Cardiovascular hypertension, Normal cardiovascular exam Rhythm:Regular Rate:Normal     Neuro/Psych negative neurological ROS  negative psych ROS   GI/Hepatic negative GI ROS, Neg liver ROS,   Endo/Other  negative endocrine ROSMorbid obesity  Renal/GU negative Renal ROS  negative genitourinary   Musculoskeletal negative musculoskeletal ROS (+)   Abdominal   Peds negative pediatric ROS (+)  Hematology  (+) anemia ,   Anesthesia Other Findings   Reproductive/Obstetrics negative OB ROS                            Anesthesia Physical Anesthesia Plan  ASA: III  Anesthesia Plan: General   Post-op Pain Management:    Induction: Intravenous  Airway Management Planned: Oral ETT  Additional Equipment:   Intra-op Plan:   Post-operative Plan: Extubation in OR  Informed Consent: I have reviewed the patients History and Physical, chart, labs and discussed the procedure including the risks, benefits and alternatives for the proposed anesthesia with the patient or authorized representative who has indicated his/her understanding and acceptance.   Dental advisory given  Plan Discussed with: CRNA  Anesthesia Plan Comments:         Anesthesia Quick Evaluation

## 2015-03-13 NOTE — Transfer of Care (Signed)
Immediate Anesthesia Transfer of Care Note  Patient: [name]  Procedure(s) Performed: Procedure(s): EXPLORATORY LAPAROTOMY (N/A) TOTAL HYSTERECTOMY ABDOMINAL BILATERAL SALPINGO OOPHORECTOMY RADICAL TUMOR DEBUKING (N/A) SMALL BOWEL RESECTION HERNIA REPAIR VENTRAL ADULT  Patient Location: PACU  Anesthesia Type:General  Level of Consciousness: awake, alert , oriented and patient cooperative  Airway & Oxygen Therapy: Patient Spontanous Breathing and Patient connected to face mask oxygen  Post-op Assessment: Report given to RN, Post -op Vital signs reviewed and stable and Patient moving all extremities  Post vital signs: Reviewed and stable  Last Vitals:  Filed Vitals:   03/13/15 0514  BP: 128/75  Pulse: 110  Temp: 37.7 C  Resp: 18    Complications: No apparent anesthesia complications

## 2015-03-13 NOTE — Anesthesia Procedure Notes (Signed)
Procedure Name: Intubation Date/Time: 03/13/2015 7:53 AM Performed by: Carleene Cooper A Pre-anesthesia Checklist: Patient identified, Emergency Drugs available, Suction available, Patient being monitored and Timeout performed Patient Re-evaluated:Patient Re-evaluated prior to inductionOxygen Delivery Method: Circle system utilized Preoxygenation: Pre-oxygenation with 100% oxygen Intubation Type: IV induction Ventilation: Mask ventilation without difficulty and Oral airway inserted - appropriate to patient size Laryngoscope Size: Mac and 4 Grade View: Grade I Tube type: Oral Tube size: 7.5 mm Number of attempts: 2 Airway Equipment and Method: Stylet Placement Confirmation: ETT inserted through vocal cords under direct vision,  positive ETCO2 and breath sounds checked- equal and bilateral Secured at: 22 cm Tube secured with: Tape Dental Injury: Teeth and Oropharynx as per pre-operative assessment  Comments: Easy mask with oral airway. DL X 1 with MAC 4. Grade 1-2 view. - ETCO2. Head repositioned, DL X 1 with MAC 4. Grade 1 view. +ETCO2, ATOI. BBS=

## 2015-03-14 LAB — CBC
HEMATOCRIT: 23.4 % — AB (ref 36.0–46.0)
HEMOGLOBIN: 7.1 g/dL — AB (ref 12.0–15.0)
MCH: 26.9 pg (ref 26.0–34.0)
MCHC: 30.3 g/dL (ref 30.0–36.0)
MCV: 88.6 fL (ref 78.0–100.0)
Platelets: 344 10*3/uL (ref 150–400)
RBC: 2.64 MIL/uL — AB (ref 3.87–5.11)
RDW: 15.1 % (ref 11.5–15.5)
WBC: 14.4 10*3/uL — ABNORMAL HIGH (ref 4.0–10.5)

## 2015-03-14 LAB — BASIC METABOLIC PANEL
Anion gap: 7 (ref 5–15)
BUN: 13 mg/dL (ref 6–20)
CHLORIDE: 102 mmol/L (ref 101–111)
CO2: 27 mmol/L (ref 22–32)
Calcium: 9 mg/dL (ref 8.9–10.3)
Creatinine, Ser: 1.34 mg/dL — ABNORMAL HIGH (ref 0.44–1.00)
GFR calc Af Amer: 53 mL/min — ABNORMAL LOW (ref >60)
GFR calc non Af Amer: 45 mL/min — ABNORMAL LOW (ref >60)
Glucose, Bld: 160 mg/dL — ABNORMAL HIGH (ref 65–99)
POTASSIUM: 5.2 mmol/L — AB (ref 3.5–5.1)
SODIUM: 136 mmol/L (ref 135–145)

## 2015-03-14 LAB — URINALYSIS, ROUTINE W REFLEX MICROSCOPIC
Bilirubin Urine: NEGATIVE
Glucose, UA: NEGATIVE mg/dL
Ketones, ur: NEGATIVE mg/dL
NITRITE: NEGATIVE
PH: 5.5 (ref 5.0–8.0)
Protein, ur: NEGATIVE mg/dL
SPECIFIC GRAVITY, URINE: 1.026 (ref 1.005–1.030)

## 2015-03-14 LAB — URINE MICROSCOPIC-ADD ON

## 2015-03-14 MED ORDER — FAMOTIDINE 20 MG PO TABS
20.0000 mg | ORAL_TABLET | Freq: Every day | ORAL | Status: DC | PRN
  Filled 2015-03-14 (×2): qty 1

## 2015-03-14 MED ORDER — IBUPROFEN 800 MG PO TABS
800.0000 mg | ORAL_TABLET | Freq: Four times a day (QID) | ORAL | Status: DC
  Administered 2015-03-14 – 2015-03-16 (×8): 800 mg via ORAL
  Filled 2015-03-14 (×12): qty 1

## 2015-03-14 MED ORDER — SODIUM CHLORIDE 0.9 % IV BOLUS (SEPSIS)
500.0000 mL | Freq: Once | INTRAVENOUS | Status: AC
  Administered 2015-03-14: 500 mL via INTRAVENOUS

## 2015-03-14 MED ORDER — DEXTROSE-NACL 5-0.45 % IV SOLN
INTRAVENOUS | Status: DC
  Administered 2015-03-14 – 2015-03-15 (×2): via INTRAVENOUS
  Administered 2015-03-16: 150 mL/h via INTRAVENOUS

## 2015-03-14 NOTE — Progress Notes (Signed)
Foley removed and patient is due to void. Pt verbalized understanding to notify nurse upon first void since foley removal.

## 2015-03-14 NOTE — Progress Notes (Signed)
1 Day Post-Op Procedure(s) (LRB): EXPLORATORY LAPAROTOMY (N/A) TOTAL HYSTERECTOMY ABDOMINAL BILATERAL SALPINGO OOPHORECTOMY RADICAL TUMOR DEBUKING (N/A) SMALL BOWEL RESECTION HERNIA REPAIR VENTRAL ADULT  Subjective: Patient reports tolerating ensure this am with no nausea or emesis.  Ambulating in the halls without difficulty.  Minimal pain reported.  Reporting left arm tightness from IV.  Reporting mild nausea with ambulating for the first time but no nausea with the second time ambulating.  Denies chest pain, dyspnea, passing flatus or having a bowel movement.    Objective: Vital signs in last 24 hours: Temp:  [97.4 F (36.3 C)-98.2 F (36.8 C)] 97.5 F (36.4 C) (12/07 1000) Pulse Rate:  [68-88] 77 (12/07 1000) Resp:  [12-18] 18 (12/07 1000) BP: (97-120)/(50-68) 109/55 mmHg (12/07 1000) SpO2:  [98 %-100 %] 100 % (12/07 1000) Weight:  [371 lb (168.284 kg)] 371 lb (168.284 kg) (12/06 1420) Last BM Date: 03/13/15  Intake/Output from previous day: 12/06 0701 - 12/07 0700 In: 5675.8 [I.V.:5675.8] Out: 1650 [Urine:600; Blood:1050]  Physical Examination: General: alert, cooperative and no distress Resp: clear to auscultation bilaterally Cardio: regular rate and rhythm, S1, S2 normal, no murmur, click, rub or gallop GI: incision: midline incision with honeycomb dressing without erythema or drainage and abdomen morbidly obese, active bowel sounds, abdomen soft, non-tympanic, non-distended Extremities: extremities normal, atraumatic, no cyanosis or edema IV left anterior forearm infiltrated, entire arm moderately swollen extending into the axilla, cool to touch, without erythema, firm.  Patient with full sensation and full mobility of the LUE.  IVF discontinued and RN notified to remove IV.  Arm elevated on two pillows.  Reportable signs and symptoms reviewed with the patient.    Labs: WBC/Hgb/Hct/Plts:  14.4/7.1/23.4/344 (12/07 0440) BUN/Cr/glu/ALT/AST/amyl/lip:  13/1.34/--/--/--/--/--  (12/07 0440)  Assessment: 49 y.o. s/p Procedure(s): EXPLORATORY LAPAROTOMY TOTAL HYSTERECTOMY ABDOMINAL BILATERAL SALPINGO OOPHORECTOMY RADICAL TUMOR DEBUKING SMALL BOWEL RESECTION HERNIA REPAIR VENTRAL ADULT: stable Pain:  Pain is well-controlled on PRN medications.  Heme: Hgb 7.1 and Hct 23.4.  Anemia present pre-operatively and counts reflective of blood loss from surgery  CV: HR and BP stable post-operatively.  Continue to monitor with ordered vital signs.  GI:  Tolerating po: Yes     GU: Due to void.  Increasing IVF to due low urinary output.      FEN: Hyperkalemia: K+ 5.2 this am.  K+ removed from fluid.  Prophylaxis: pharmacologic prophylaxis (with any of the following: enoxaparin (Lovenox) 40mg  SQ 2 hours prior to surgery then every day) and intermittent pneumatic compression boots.  Plan: Advance diet as tolerated 500 NS bolus AM labs Increase IVF to 175 cc/hr and remove K+ from fluid Encourage ambulation Bladder scan now Restart IV Encourage IS use, deep breathing, and coughing Continue post-operative plan of care per Dr. Delsa Sale    LOS: 1 day    CROSS, MELISSA DEAL 03/14/2015, 1:02 PM

## 2015-03-15 ENCOUNTER — Telehealth: Payer: Self-pay | Admitting: *Deleted

## 2015-03-15 LAB — BASIC METABOLIC PANEL
ANION GAP: 6 (ref 5–15)
Anion gap: 7 (ref 5–15)
BUN: 18 mg/dL (ref 6–20)
BUN: 16 mg/dL (ref 6–20)
CALCIUM: 8.8 mg/dL — AB (ref 8.9–10.3)
CALCIUM: 8.5 mg/dL — AB (ref 8.9–10.3)
CHLORIDE: 103 mmol/L (ref 101–111)
CO2: 27 mmol/L (ref 22–32)
CO2: 27 mmol/L (ref 22–32)
CREATININE: 1.49 mg/dL — AB (ref 0.44–1.00)
CREATININE: 1.28 mg/dL — AB (ref 0.44–1.00)
Chloride: 103 mmol/L (ref 101–111)
GFR, EST AFRICAN AMERICAN: 46 mL/min — AB (ref >60)
GFR, EST AFRICAN AMERICAN: 56 mL/min — AB (ref >60)
GFR, EST NON AFRICAN AMERICAN: 40 mL/min — AB (ref >60)
GFR, EST NON AFRICAN AMERICAN: 48 mL/min — AB (ref >60)
Glucose, Bld: 133 mg/dL — ABNORMAL HIGH (ref 65–99)
Glucose, Bld: 149 mg/dL — ABNORMAL HIGH (ref 65–99)
Potassium: 4.6 mmol/L (ref 3.5–5.1)
Potassium: 4.6 mmol/L (ref 3.5–5.1)
SODIUM: 136 mmol/L (ref 135–145)
SODIUM: 137 mmol/L (ref 135–145)

## 2015-03-15 LAB — CBC
HEMATOCRIT: 20 % — AB (ref 36.0–46.0)
Hemoglobin: 6.3 g/dL — CL (ref 12.0–15.0)
MCH: 27.4 pg (ref 26.0–34.0)
MCHC: 31.5 g/dL (ref 30.0–36.0)
MCV: 87 fL (ref 78.0–100.0)
Platelets: 352 10*3/uL (ref 150–400)
RBC: 2.3 MIL/uL — ABNORMAL LOW (ref 3.87–5.11)
RDW: 15.5 % (ref 11.5–15.5)
WBC: 14.8 10*3/uL — AB (ref 4.0–10.5)

## 2015-03-15 LAB — TYPE AND SCREEN
ABO/RH(D): A POS
Antibody Screen: NEGATIVE
UNIT DIVISION: 0
Unit division: 0

## 2015-03-15 LAB — INHIBIN B

## 2015-03-15 LAB — PREPARE RBC (CROSSMATCH)

## 2015-03-15 MED ORDER — DIPHENHYDRAMINE HCL 25 MG PO CAPS
25.0000 mg | ORAL_CAPSULE | Freq: Once | ORAL | Status: AC
  Administered 2015-03-15: 25 mg via ORAL
  Filled 2015-03-15: qty 1

## 2015-03-15 MED ORDER — SODIUM CHLORIDE 0.9 % IV SOLN
Freq: Once | INTRAVENOUS | Status: AC
  Administered 2015-03-15: 10:00:00 via INTRAVENOUS

## 2015-03-15 MED ORDER — SODIUM CHLORIDE 0.9 % IV BOLUS (SEPSIS)
500.0000 mL | Freq: Once | INTRAVENOUS | Status: AC
  Administered 2015-03-15: 500 mL via INTRAVENOUS

## 2015-03-15 MED ORDER — ACETAMINOPHEN 325 MG PO TABS
650.0000 mg | ORAL_TABLET | Freq: Once | ORAL | Status: AC
  Administered 2015-03-15: 650 mg via ORAL
  Filled 2015-03-15: qty 2

## 2015-03-15 MED ORDER — ENOXAPARIN (LOVENOX) PATIENT EDUCATION KIT
PACK | Freq: Once | Status: AC
  Administered 2015-03-16: 08:00:00
  Filled 2015-03-15: qty 1

## 2015-03-15 NOTE — Telephone Encounter (Signed)
Left voice message that forms are complete, ready for pick up and faxed per request.  Per Dr. Mingo Amber, form back dated to 01/30/15 per office notes.  Forms copied for scanning in patient's record.  Derl Barrow, RN

## 2015-03-15 NOTE — Telephone Encounter (Signed)
Completed and placed in [name]'s box.  

## 2015-03-15 NOTE — Telephone Encounter (Signed)
Patient's daughter brought in the original copy of FMLA that was completed by Dr. Mingo Amber.  Patient need the first day of FMLA to start on 02/09/15 when she first started getting sick with fevers every day.  Dr. Mingo Amber is aware and will complete form.  Would like forms to completed today if possible to prevent loss of job.  Will call daughter to pick up original and will also fax forms per request.  Derl Barrow, RN]

## 2015-03-15 NOTE — Progress Notes (Signed)
Call out to Dr. Glennon Mac concerning a critical Hemoglobin at 6.3  this 06:22am. No new orders at this time. Pt placed on O2 2L nasal cannula and verbalized understanding not to get up without assistance from staff. Phone and call light with in reach, IVF's infusing without difficulty.

## 2015-03-15 NOTE — Progress Notes (Signed)
Patient ID: [name], female   DOB: June 10, 1965, 49 y.o.   MRN: [id number] 2 Days Post-Op Procedure(s) (LRB): EXPLORATORY LAPAROTOMY (N/A) TOTAL HYSTERECTOMY ABDOMINAL BILATERAL SALPINGO OOPHORECTOMY RADICAL TUMOR DEBUKING (N/A) SMALL BOWEL RESECTION HERNIA REPAIR VENTRAL ADULT  Subjective: Patient reports emesis yesterday.  Denies nausea at present. C/O lightheadedness.  Reports flatus/BM    Objective: Vital signs in last 24 hours: Temp:  [97.4 F (36.3 C)-97.8 F (36.6 C)] 97.8 F (36.6 C) (12/08 0509) Pulse Rate:  [77-88] 77 (12/08 0509) Resp:  [16-18] 16 (12/08 0509) BP: (102-120)/(47-64) 102/47 mmHg (12/08 0509) SpO2:  [87 %-100 %] 100 % (12/08 0509) Last BM Date: 03/14/15  Intake/Output from previous day: 12/07 0701 - 12/08 0700 In: 1000 [P.O.:600; I.V.:400] Out: 275 [Urine:275]  Physical Examination: General: alert, cooperative and no distress Resp: clear to auscultation bilaterally Cardio: regular rate and rhythm, S1, S2 normal, no murmur, click, rub or gallop GI: incision: midline incision with honeycomb dressing without erythema or drainage and abdomen morbidly obese, active bowel sounds, abdomen soft, non-tympanic, non-distended Extremities: extremities normal, atraumatic, no cyanosis or edema    Labs: WBC/Hgb/Hct/Plts:  14.8/6.3/20.0/352 (12/08 0445) BUN/Cr/glu/ALT/AST/amyl/lip:  18/1.49/--/--/--/--/-- (12/08 0445)  Assessment: 49 y.o. s/p Procedure(s): EXPLORATORY LAPAROTOMY TOTAL HYSTERECTOMY ABDOMINAL BILATERAL SALPINGO OOPHORECTOMY RADICAL TUMOR DEBUKING SMALL BOWEL RESECTION HERNIA REPAIR VENTRAL ADULT: stable Pain:  Pain is well-controlled on PRN medications.  Heme: Hgb 6.3.  Severe acute anemia in the setting of chronic anemia. Symptomatic.  Hemodynamically stable  CV: HR and BP stable   GI:  Tolerating po: Yes     REN: AKI/prerenal.  Creatinine mildly elevated       FEN: Hyperkalemia resolved. Hypovolemia  Prophylaxis: pharmacologic  prophylaxis (with any of the following: enoxaparin (Lovenox) 40mg  SQ 2 hours prior to surgery then every day) and intermittent pneumatic compression boots.  Plan: Transfuse 2 units PRBC 500 NS bolus Repeat labs this pm Increase IVF to 150 ml/hr  Encourage ambulation Encourage IS use, deep breathing, and coughing Discharge planning    LOS: 2 days    JACKSON-MOORE,Raphael Espe A 03/15/2015, 9:22 AM

## 2015-03-16 LAB — TYPE AND SCREEN
ABO/RH(D): A POS
ANTIBODY SCREEN: NEGATIVE
UNIT DIVISION: 0
Unit division: 0

## 2015-03-16 MED ORDER — ENOXAPARIN SODIUM 40 MG/0.4ML ~~LOC~~ SOLN
40.0000 mg | SUBCUTANEOUS | Status: DC
Start: 2015-03-16 — End: 2015-04-23

## 2015-03-16 MED ORDER — OXYCODONE HCL 5 MG PO TABS: 5.0000 mg | ORAL_TABLET | ORAL | Status: DC | PRN

## 2015-03-16 NOTE — Discharge Summary (Signed)
Physician Discharge Summary  Patient ID: [name] MRN: [id number] DOB/AGE: 1965-06-13 49 y.o.  Admit date: 03/13/2015 Discharge date: 03/16/2015  Admission Diagnoses: Pelvic mass in female  Discharge Diagnoses:  Principal Problem:   Pelvic mass in female Active Problems:   Ventral hernia without obstruction or gangrene   Discharged Condition:  The patient is in good condition and stable for discharge.   Hospital Course: On 03/13/2015, the patient underwent the following: Procedure(s): EXPLORATORY LAPAROTOMY TOTAL HYSTERECTOMY ABDOMINAL BILATERAL SALPINGO OOPHORECTOMY RADICAL TUMOR DEBULKING SMALL BOWEL RESECTION, HERNIA REPAIR VENTRAL ADULT.  She received two units PRBCs on 03/15/15 for a Hgb 6.3 with severe acute symptomatic anemia in the setting of chronic anemia.  She was discharged to home on postoperative day 3 tolerating a regular diet, passing flatus, having bowel movements, minimal pain.  Stating the lightheadedness and dizziness resolved after blood transfusion.  Consults: None  Significant Diagnostic Studies: None  Treatments: surgery: see above  Discharge Exam: Blood pressure 140/64, pulse 85, temperature 97.7 F (36.5 C), temperature source Oral, resp. rate 20, height 5' 5.5" (1.664 m), weight 371 lb (168.284 kg), SpO2 100 %. General appearance: alert, cooperative and no distress Resp: clear to auscultation bilaterally Cardio: regular rate and rhythm, S1, S2 normal, no murmur, click, rub or gallop GI: soft, non-tender; bowel sounds normal; no masses,  no organomegaly and abdomen morbidly obese Extremities: extremities normal, atraumatic, no cyanosis or edema Incision/Wound: Midline incision with honeycomb dressing intact, staple beneath, without drainage or erythema Left arm soft with firmness resolved from IV infiltrate  Disposition: 01-Home or Self Care      Discharge Instructions    Call MD for:  difficulty breathing, headache or visual disturbances     Complete by:  As directed      Call MD for:  extreme fatigue    Complete by:  As directed      Call MD for:  hives    Complete by:  As directed      Call MD for:  persistant dizziness or light-headedness    Complete by:  As directed      Call MD for:  persistant nausea and vomiting    Complete by:  As directed      Call MD for:  redness, tenderness, or signs of infection (pain, swelling, redness, odor or green/yellow discharge around incision site)    Complete by:  As directed      Call MD for:  severe uncontrolled pain    Complete by:  As directed      Call MD for:  temperature >100.4    Complete by:  As directed      Diet - low sodium heart healthy    Complete by:  As directed      Discharge instructions    Complete by:  As directed   Go to the bathroom every 2 -3 hours to urinate since the sensation to void is not completely there.     Driving Restrictions    Complete by:  As directed   No driving for 2 weeks.  Do not take narcotics and drive.     Increase activity slowly    Complete by:  As directed      Lifting restrictions    Complete by:  As directed   No lifting greater than 10 lbs.     Sexual Activity Restrictions    Complete by:  As directed   No sexual activity, nothing in the vagina, for 6 weeks.  Medication List    TAKE these medications        clobetasol cream 0.05 %  Commonly known as:  TEMOVATE  Apply topically 2 (two) times daily.     enoxaparin 40 MG/0.4ML injection  Commonly known as:  LOVENOX  Inject 0.4 mLs (40 mg total) into the skin daily.     oxyCODONE 5 MG immediate release tablet  Commonly known as:  Oxy IR/ROXICODONE  Take 1-2 tablets (5-10 mg total) by mouth every 4 (four) hours as needed for severe pain.     ranitidine 150 MG tablet  Commonly known as:  ZANTAC  Take 1 tablet (150 mg total) by mouth at bedtime.       Follow-up Information    Follow up with Alison Kamara DEAL, NP On 03/23/2015.   Specialty:   Gynecologic Oncology   Why:  at the [facility] for staple removal at 10:30am.   Contact information:   [address] [place] [address] [phone]       Follow up with Alison Eva, MD On 03/29/2015.   Specialty:  Obstetrics and Gynecology   Why:  at the [facility] at 12:45pm    Contact information:   [address] [place] [address] [phone]       Greater than thirty minutes were spend for face to face discharge instructions and discharge orders/summary in EPIC.   Signed: [name] 03/16/2015, 10:29 AM

## 2015-03-16 NOTE — Discharge Instructions (Signed)
03/16/2015  Return to work: 4-6 weeks if applicable  Activity: 1. Be up and out of the bed during the day.  Take a nap if needed.  You may walk up steps but be careful and use the hand rail.  Stair climbing will tire you more than you think, you may need to stop part way and rest.   2. No lifting or straining for 6 weeks.  3. No driving for 2 week(s).  Do not drive if you are taking narcotic pain medicine.  4. Shower daily.  Use soap and water on your incision and pat dry; don't rub.  No tub baths until cleared by your surgeon.   5. No sexual activity and nothing in the vagina for 6 weeks.  6. You may experience a small amount of clear drainage from your incisions, which is normal.  If the drainage persists or increases, please call the office.   Diet: 1. Low sodium Heart Healthy Diet is recommended.  2. It is safe to use a laxative, such as Miralax or Colace, if you have difficulty moving your bowels.   Wound Care: 1. Keep clean and dry.  Shower daily.  Reasons to call the Doctor:  Fever - Oral temperature greater than 100.4 degrees Fahrenheit  Foul-smelling vaginal discharge  Difficulty urinating  Nausea and vomiting  Increased pain at the site of the incision that is unrelieved with pain medicine.  Difficulty breathing with or without chest pain  New calf pain especially if only on one side  Sudden, continuing increased vaginal bleeding with or without clots.   Contacts: For questions or concerns you should contact:  Dr. Everitt Amber at [phone]  Joylene John, NP at [phone]  After Hours: call [phone] and have the GYN Oncologist paged/contacted  Oxycodone tablets or capsules What is this medicine? OXYCODONE (ox i KOE done) is a pain reliever. It is used to treat moderate to severe pain. This medicine may be used for other purposes; ask your health care provider or pharmacist if you have questions. What should I tell my health care provider before  I take this medicine? They need to know if you have any of these conditions: -Addison's disease -brain tumor -head injury -heart disease -history of drug or alcohol abuse problem -if you often drink alcohol -kidney disease -liver disease -lung or breathing disease, like asthma -mental illness -pancreatic disease -seizures -thyroid disease -an unusual or allergic reaction to oxycodone, codeine, hydrocodone, morphine, other medicines, foods, dyes, or preservatives -pregnant or trying to get pregnant -breast-feeding How should I use this medicine? Take this medicine by mouth with a glass of water. Follow the directions on the prescription label. You can take it with or without food. If it upsets your stomach, take it with food. Take your medicine at regular intervals. Do not take it more often than directed. Do not stop taking except on your doctor's advice. Some brands of this medicine, like Oxecta, have special instructions. Ask your doctor or pharmacist if these directions are for you: Do not cut, crush or chew this medicine. Swallow only one tablet at a time. Do not wet, soak, or lick the tablet before you take it. Talk to your pediatrician regarding the use of this medicine in children. Special care may be needed. Overdosage: If you think you have taken too much of this medicine contact a poison control center or emergency room at once. NOTE: This medicine is only for you. Do not share this medicine with others. What  if I miss a dose? If you miss a dose, take it as soon as you can. If it is almost time for your next dose, take only that dose. Do not take double or extra doses. What may interact with this medicine? -alcohol -antihistamines -certain medicines used for nausea like chlorpromazine, droperidol -erythromycin -ketoconazole -medicines for depression, anxiety, or psychotic disturbances -medicines for sleep -muscle relaxants -naloxone -naltrexone -narcotic medicines  (opiates) for pain -nilotinib -phenobarbital -phenytoin -rifampin -ritonavir -voriconazole This list may not describe all possible interactions. Give your health care provider a list of all the medicines, herbs, non-prescription drugs, or dietary supplements you use. Also tell them if you smoke, drink alcohol, or use illegal drugs. Some items may interact with your medicine. What should I watch for while using this medicine? Tell your doctor or health care professional if your pain does not go away, if it gets worse, or if you have new or a different type of pain. You may develop tolerance to the medicine. Tolerance means that you will need a higher dose of the medicine for pain relief. Tolerance is normal and is expected if you take this medicine for a long time. Do not suddenly stop taking your medicine because you may develop a severe reaction. Your body becomes used to the medicine. This does NOT mean you are addicted. Addiction is a behavior related to getting and using a drug for a non-medical reason. If you have pain, you have a medical reason to take pain medicine. Your doctor will tell you how much medicine to take. If your doctor wants you to stop the medicine, the dose will be slowly lowered over time to avoid any side effects. You may get drowsy or dizzy when you first start taking this medicine or change doses. Do not drive, use machinery, or do anything that may be dangerous until you know how the medicine affects you. Stand or sit up slowly. There are different types of narcotic medicines (opiates) for pain. If you take more than one type at the same time, you may have more side effects. Give your health care provider a list of all medicines you use. Your doctor will tell you how much medicine to take. Do not take more medicine than directed. Call emergency for help if you have problems breathing. This medicine will cause constipation. Try to have a bowel movement at least every 2 to 3  days. If you do not have a bowel movement for 3 days, call your doctor or health care professional. Your mouth may get dry. Drinking water, chewing sugarless gum, or sucking on hard candy may help. See your dentist every 6 months. What side effects may I notice from receiving this medicine? Side effects that you should report to your doctor or health care professional as soon as possible: -allergic reactions like skin rash, itching or hives, swelling of the face, lips, or tongue -breathing problems -confusion -feeling faint or lightheaded, falls -trouble passing urine or change in the amount of urine -unusually weak or tired Side effects that usually do not require medical attention (report to your doctor or health care professional if they continue or are bothersome): -constipation -dry mouth -itching -nausea, vomiting -upset stomach This list may not describe all possible side effects. Call your doctor for medical advice about side effects. You may report side effects to FDA at 1-800-FDA-1088. Where should I keep my medicine? Keep out of the reach of children. This medicine can be abused. Keep your medicine in  a safe place to protect it from theft. Do not share this medicine with anyone. Selling or giving away this medicine is dangerous and against the law. Store at room temperature between 15 and 30 degrees C (59 and 86 degrees F). Protect from light. Keep container tightly closed. This medicine may cause accidental overdose and death if it is taken by other adults, children, or pets. Flush any unused medicine down the toilet to reduce the chance of harm. Do not use the medicine after the expiration date. NOTE: This sheet is a summary. It may not cover all possible information. If you have questions about this medicine, talk to your doctor, pharmacist, or health care provider.    2016, Elsevier/Gold Standard. (2014-08-05 01:15:14)  Abdominal Hysterectomy, Care After These instructions  give you information on caring for yourself after your procedure. Your doctor may also give you more specific instructions. Call your doctor if you have any problems or questions after your procedure.  HOME CARE It takes 4-6 weeks to recover from this surgery. Follow all of your doctor's instructions.   Only take medicines as told by your doctor.  Change your bandage as told by your doctor.  Return to your doctor to have your stitches taken out.  Take showers for 2-3 weeks. Ask your doctor when it is okay to shower.  Do not douche, use tampons, or have sex (intercourse) for at least 6 weeks or as told.  Follow your doctor's advice about exercise, lifting objects, driving, and general activities.  Get plenty of rest and sleep.  Do not lift anything heavier than a gallon of milk (about 10 pounds [4.5 kilograms]) for the first month after surgery.  Get back to your normal diet as told by your doctor.  Do not drink alcohol until your doctor says it is okay.  Take a medicine to help you poop (laxative) as told by your doctor.  Eating foods high in fiber may help you poop. Eat a lot of raw fruits and vegetables, whole grains, and beans.  Drink enough fluids to keep your pee (urine) clear or pale yellow.  Have someone help you at home for 1-2 weeks after your surgery.  Keep follow-up doctor visits as told. GET HELP IF:  You have chills or fever.  You have puffiness, redness, or pain in area of the cut (incision).  You have yellowish-white fluid (pus) coming from the cut.  You have a bad smell coming from the cut or bandage.  Your cut pulls apart.  You feel dizzy or light-headed.  You have pain or bleeding when you pee.  You keep having watery poop (diarrhea).  You keep feeling sick to your stomach (nauseous) or keep throwing up (vomiting).  You have fluid (discharge) coming from your vagina.  You have a rash.  You have a reaction to your medicine.  You need  stronger pain medicine. GET HELP RIGHT AWAY IF:   You have a fever and your symptoms suddenly get worse.  You have bad belly (abdominal) pain.  You have chest pain.  You are short of breath.  You pass out (faint).  You have pain, puffiness, or redness of your leg.  You bleed a lot from your vagina and notice clumps of tissue (clots). MAKE SURE YOU:   Understand these instructions.  Will watch your condition.  Will get help right away if you are not doing well or get worse.   This information is not intended to replace advice given to  you by your health care provider. Make sure you discuss any questions you have with your health care provider.   Document Released: 01/01/2008 Document Revised: 03/29/2013 Document Reviewed: 01/14/2013 Elsevier Interactive Patient Education [employer].

## 2015-03-19 ENCOUNTER — Telehealth: Payer: Self-pay | Admitting: *Deleted

## 2015-03-19 NOTE — Telephone Encounter (Signed)
Follow up call placed to pt to discuss concerns. Pt states she is doing better, has taken colace everyday and had BMx1. Pain is controlled by tylenol as she does not want to take the " pain pill" she was given by MD. Discussed with with NP. No further instructions.

## 2015-03-21 NOTE — Telephone Encounter (Signed)
Forms received by work do not have an ending date. They need this date by dec 16.  Could dr Mingo Amber send this back in?

## 2015-03-21 NOTE — Telephone Encounter (Signed)
Patient informed that end date for FMLA is 06/02/15 per Dr. Mingo Amber.  Patient is ok with that. She will have Oncologist complete any other FMLA that is related to Oncology treatment.  FMLA was re-faxed per patient request.  Derl Barrow, RN

## 2015-03-21 NOTE — Telephone Encounter (Signed)
Please call the patient back and let her know that I will be able to complete this.  I do not have the forms in my box.  Have they been sent?  Also, we're not sure when her end date for therapy will be.  Has the oncologist mentioned this?  Otherwise I will put 3 months if she is okay with this, or she may have another answer from another physician.  Please ask her and I will put the date she needs on the form.  Thanks!  JW

## 2015-03-23 ENCOUNTER — Encounter: Payer: Self-pay | Admitting: Gynecologic Oncology

## 2015-03-23 ENCOUNTER — Ambulatory Visit: Payer: 59 | Attending: Gynecologic Oncology | Admitting: Gynecologic Oncology

## 2015-03-23 VITALS — BP 141/81 | HR 100 | Temp 98.0°F | Resp 22 | Ht 65.5 in | Wt 366.0 lb

## 2015-03-23 DIAGNOSIS — N839 Noninflammatory disorder of ovary, fallopian tube and broad ligament, unspecified: Secondary | ICD-10-CM | POA: Diagnosis not present

## 2015-03-23 DIAGNOSIS — T792XXA Traumatic secondary and recurrent hemorrhage and seroma, initial encounter: Secondary | ICD-10-CM | POA: Diagnosis not present

## 2015-03-23 DIAGNOSIS — T888XXA Other specified complications of surgical and medical care, not elsewhere classified, initial encounter: Secondary | ICD-10-CM | POA: Insufficient documentation

## 2015-03-23 DIAGNOSIS — N838 Other noninflammatory disorders of ovary, fallopian tube and broad ligament: Secondary | ICD-10-CM

## 2015-03-23 NOTE — Patient Instructions (Signed)
Plan to change the incision (pack the open area) twice daily.  Follow up as scheduled or sooner if needed.  Dr. Denman George will call you with the results of your pathology from today.  Please call for any questions, concerns, new symptoms of fever, chills, nausea, vomiting, abd pain.    Dressing Change A dressing is a material placed over wounds. It keeps the wound clean, dry, and protected from further injury.  BEFORE YOU BEGIN  Get your supplies together. Things you may need include:  Salt solution (saline).  Flexible gauze bandage.  Medicated cream.  Tape.  Gloves.  Belly (abdominal) pads.  Gauze squares.  Plastic bags.  Take pain medicine 30 minutes before the bandage change if you need it.  Take a shower before you do the first bandage change of the day. Put plastic wrap or a bag over the dressing. REMOVING YOUR OLD BANDAGE  Wash your hands with soap and water. Dry your hands with a clean towel.  Put on your gloves.  Remove any tape.  Remove the old bandage as told. If it sticks, put a small amount of warm water on it to loosen the bandage.  Remove any gauze or packing tape in your wound.  Take off your gloves.  Put the gloves, tape, gauze, or any packing tape in a plastic bag. CHANGING YOUR BANDAGE  Open the supplies.  Take the cap off the salt solution.  Open the gauze. Leave the gauze on the inside of the package.  Put on your gloves.  Clean your wound as told by your doctor.  Keep your wound dry if your doctor told you to do so.  Your doctor may tell you to do one or more of the following:  Pick up the gauze. Pour the salt solution over the gauze. Squeeze out the extra salt solution.  Put medicated cream or other medicine on your wound.  Put solution soaked gauze only in your wound, not on the skin around it.  Pack your wound loosely.  Put dry gauze on your wound.  Put belly pads over the dry gauze if your bandages soak through.  Tape the  bandage in place so it will not fall off. Do not wrap the tape all the way around your arm or leg.  Wrap the bandage with the flexible gauze bandage as told by your doctor.  Take off your gloves. Put them in the plastic bag with the old bandage. Tie the bag shut and throw it away.  Keep the bandage clean and dry.  Wash your hands. GET HELP RIGHT AWAY IF:   Your skin around the wound looks red.  Your wound feels more tender or sore.  You see yellowish-white fluid (pus) in the wound.  Your wound smells bad.  You have a fever.  Your skin around the wound has a red rash that itches and burns.  You see black or yellow skin in your wound that was not there before.  You feel sick to your stomach (nauseous), throw up (vomit), and feel very tired.   This information is not intended to replace advice given to you by your health care provider. Make sure you discuss any questions you have with your health care provider.   Document Released: 06/20/2008 Document Revised: 04/14/2014 Document Reviewed: 02/02/2011 Elsevier Interactive Patient Education [employer].

## 2015-03-27 ENCOUNTER — Encounter (HOSPITAL_COMMUNITY): Payer: Self-pay

## 2015-03-27 NOTE — Progress Notes (Signed)
Follow Up Note: Gyn-Onc  [name] 49 y.o. female  CC:  Chief Complaint  Patient presents with  . ovarian mass, right    follow up    HPI:  [name] is a 49 year old female initially seen inpatient at [facility] on 02/19/2015 for complex pelvic masses. The patient is a morbidly obese woman with a BMI 62 kg/m.  On 02/18/2015, she was admitted to [facility] with pyelonephritis with CT and MRI imaging performed to evaluate for an etiology for pyelonephritis.  Findings resulted a large pelvic mass appearing to be arising from the right adnexa. It measured 13 x 9 x 17.5 cm and was irregular cystic and solid in nature with a second, cystic and solid lesion within the left adnexa measuring 4.4 x 6.5 x 5.3 cm. A third cystic and solid mass was seen anterior to the uterus and superior to the bladder measuring 4.2 x 5.4 x 5.3 cm. She had no pelvic lymphadenopathy, no carcinomatosis, and CA 125 was normal at 21 U/mL.  Her infection was adequately treated with IV antibiotics and her acute kidney injury which manifested with an elevated creatinine 12.4 spontaneously resolved during her hospital stay with avoidance of NSAID and good hydration. She was noted to be anemic during her hospitalization with a hemoglobin of 10.8, which did not require transfusion and spontaneously improved over time.  On 03/13/15, she underwent an exploratory laparotomy with total abdominal hysterectomy bilateral salpingo-oophorectomy, small bowel resection, radical tumor debulking for ovarian cancer and ventral hernia repair by Dr. Everitt Amber.  Final pathology pending at this time.  Interval History:  She presents today for post-operative follow up and staple removal.  She reports doing well since surgery.  Tolerating diet with decreased appetite improving.  No nausea or emesis reported.  Ambulating without difficulty.  Minimal pain reported.  No concerns voiced with her incision.  Denies vaginal bleeding post-op.  Patient  states she has had to have dressing changes on her abdomen in the past and she feels comfortable changing the dressing and having her family assist as well.  No concerns voiced.    Review of Systems  Constitutional: Feels well.  No fever, chills, early satiety, unintentional weight loss or gain. Cardiovascular: No chest pain, shortness of breath, or edema.  Pulmonary: No cough or wheeze.  Gastrointestinal: No nausea, vomiting, or diarrhea. No bright red blood per rectum or change in bowel movement.  Genitourinary: No frequency, urgency, or dysuria. No vaginal bleeding or discharge.  Musculoskeletal: No myalgia or joint pain. Neurologic: No weakness, numbness, or change in gait.  Psychology: No depression, anxiety, or insomnia.  Current Meds:  Outpatient Encounter Prescriptions as of 03/23/2015  Medication Sig  . clobetasol cream (TEMOVATE) 0.05 % Apply topically 2 (two) times daily. (Patient taking differently: Apply 1 application topically at bedtime as needed. )  . enoxaparin (LOVENOX) 40 MG/0.4ML injection Inject 0.4 mLs (40 mg total) into the skin daily.  Marland Kitchen oxyCODONE (OXY IR/ROXICODONE) 5 MG immediate release tablet Take 1-2 tablets (5-10 mg total) by mouth every 4 (four) hours as needed for severe pain.  . [DISCONTINUED] enoxaparin (LOVENOX) 40 MG/0.4ML injection   . [DISCONTINUED] oxyCODONE (OXY IR/ROXICODONE) 5 MG immediate release tablet   . [DISCONTINUED] ranitidine (ZANTAC) 150 MG tablet Take 1 tablet (150 mg total) by mouth at bedtime.   No facility-administered encounter medications on file as of 03/23/2015.    Allergy:  Allergies  Allergen Reactions  . Penicillins Rash    Has  patient had a PCN reaction causing immediate rash, facial/tongue/throat swelling, SOB or lightheadedness with hypotension: no Has patient had a PCN reaction causing severe rash involving mucus membranes or skin necrosis: no Has patient had a PCN reaction that required hospitalization in the hospital  at the time Has patient had a PCN reaction occurring within the last 10 years: no If all of the above answers are "NO", then may proceed with Cephalosporin use.     Social Hx:   Social History   Social History  . Marital Status: Legally Separated    Spouse Name: N/A  . Number of Children: N/A  . Years of Education: N/A   Occupational History  . Not on file.   Social History Main Topics  . Smoking status: Never Smoker   . Smokeless tobacco: Never Used  . Alcohol Use: 0.0 oz/week     Comment: Maybe wine every 6 months  . Drug Use: No  . Sexual Activity: Yes     Comment: Told she could not have more children in 1986   Other Topics Concern  . 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   Married x 23 years, recently separated (4/15).  No domestic violence.  + financial stress.     Lives previously with husband who moved out, now with daughter who has medical problems, including Hodgkin's disease, and granddaughter (age 81).     Uses seat belt.     Past Surgical Hx:  Past Surgical History  Procedure Laterality Date  . Colostomy takedown    . Colostomy  2008  . Cesarean section  1983 and 1984  . Tonsillectomy and adenoidectomy  1997  . Laparotomy N/A 03/13/2015    Procedure: EXPLORATORY LAPAROTOMY;  Surgeon: Everitt Amber, MD;  Location: [facility];  Service: Gynecology;  Laterality: N/A;  . Abdominal hysterectomy N/A 03/13/2015    Procedure: TOTAL HYSTERECTOMY ABDOMINAL BILATERAL SALPINGO OOPHORECTOMY RADICAL TUMOR [place];  Surgeon: Everitt Amber, MD;  Location: [facility];  Service: Gynecology;  Laterality: N/A;  . Bowel resection  03/13/2015    Procedure: SMALL BOWEL RESECTION;  Surgeon: Everitt Amber, MD;  Location: [facility];  Service: Gynecology;;  . Ventral hernia repair  03/13/2015    Procedure: HERNIA REPAIR VENTRAL ADULT;  Surgeon: Everitt Amber, MD;  Location: [facility];   Service: Gynecology;;    Past Medical Hx:  Past Medical History  Diagnosis Date  . Hypertension   . Hx of migraines   . IBS (irritable bowel syndrome)   . Diverticulitis with perforation 2010  . Family history of adverse reaction to anesthesia     pts daughter had difficulty awakening following anesthesia   . History of bronchitis   . History of ear infections   . Kidney infection   . GERD (gastroesophageal reflux disease)   . Headache   . Morbid obesity ([place])   . Anemia   . Pyelonephritis   . Cough     Family Hx:  Family History  Problem Relation Age of Onset  . Diabetes Mother   . Hypertension Mother   . Hyperlipidemia Mother   . Hyperlipidemia Father   . Cancer Daughter 51    Hodgkins  . Diabetes Daughter   . Heart disease Neg Hx   . Stroke Neg Hx     Vitals:  Blood pressure 141/81, pulse 100, temperature  39 F (36.7 C), temperature source Oral, resp. rate 22, height 5' 5.5" (1.664 m), weight 366 lb (166.017 kg), SpO2 100 %.  Physical Exam:  General: Well developed, well nourished female in no acute distress. Alert and oriented x 3.  Cardiovascular: Regular rate and rhythm. S1 and S2 normal.  Lungs: Clear to auscultation bilaterally. No wheezes/crackles/rhonchi noted.  Skin: No rashes or lesions present. Back: No CVA tenderness.  Abdomen: Abdomen soft, non-tender and morbidly obese. Active bowel sounds in all quadrants.  Honeycomb dressing removed.  Staples removed from the midline incision without difficulty.  Moderate amount of serosanguinous drainage noted from the incision below the umbilicus.  Incision opening in the area of drainage measuring 1.5 cm length, 3 cm depth, 1.5 cm width.  Incision assessed by Dr. Denman George.  No evidence of infection.  Minor wound separation packed with 1/2 inch plain packing strip.  Patient instructed as well.    Extremities: No bilateral cyanosis, edema, or clubbing.   Assessment/Plan:  49 year old female s/p exploratory laparotomy  with total abdominal hysterectomy bilateral salpingo-oophorectomy, small bowel resection, radical tumor debulking for ovarian cancer and ventral hernia repair by Dr. Everitt Amber on 12/6 for complex pelvic masses.  Final pathology pending at this time.  Incision care along with dressing changes discussed with the patient and supplies given.  She is advised to change the dressing twice daily.  She will be contacted by Dr. Denman George once the final path report is available.  Reportable signs and symptoms reviewed.  She is going to follow up on Thurs., Dec 22 with Dr. Denman George or sooner if needed.  She is advised to call for any questions or concerns.       Orma Cheetham DEAL, NP 03/27/2015, 11:33 AM

## 2015-03-28 ENCOUNTER — Other Ambulatory Visit: Payer: Self-pay | Admitting: Oncology

## 2015-03-28 DIAGNOSIS — C561 Malignant neoplasm of right ovary: Secondary | ICD-10-CM

## 2015-03-29 ENCOUNTER — Encounter: Payer: Self-pay | Admitting: *Deleted

## 2015-03-29 ENCOUNTER — Ambulatory Visit (HOSPITAL_BASED_OUTPATIENT_CLINIC_OR_DEPARTMENT_OTHER): Payer: 59 | Admitting: Gynecologic Oncology

## 2015-03-29 ENCOUNTER — Encounter (HOSPITAL_COMMUNITY): Payer: 59

## 2015-03-29 ENCOUNTER — Telehealth: Payer: Self-pay | Admitting: Oncology

## 2015-03-29 ENCOUNTER — Other Ambulatory Visit (HOSPITAL_BASED_OUTPATIENT_CLINIC_OR_DEPARTMENT_OTHER): Payer: 59

## 2015-03-29 ENCOUNTER — Ambulatory Visit (HOSPITAL_BASED_OUTPATIENT_CLINIC_OR_DEPARTMENT_OTHER): Payer: 59 | Admitting: Oncology

## 2015-03-29 ENCOUNTER — Other Ambulatory Visit: Payer: 59

## 2015-03-29 ENCOUNTER — Encounter: Payer: Self-pay | Admitting: Oncology

## 2015-03-29 ENCOUNTER — Ambulatory Visit (HOSPITAL_COMMUNITY)
Admission: RE | Admit: 2015-03-29 | Discharge: 2015-03-29 | Disposition: A | Discharge status: Home / Self Care | Payer: 59 | Source: Ambulatory Visit | Attending: Gynecologic Oncology | Admitting: Gynecologic Oncology

## 2015-03-29 ENCOUNTER — Encounter: Payer: Self-pay | Admitting: Gynecologic Oncology

## 2015-03-29 VITALS — BP 154/73 | HR 85 | Temp 98.2°F | Resp 18 | Ht 65.5 in | Wt 378.4 lb

## 2015-03-29 VITALS — BP 154/73 | HR 85 | Temp 98.2°F | Resp 18 | Ht 65.5 in | Wt 378.6 lb

## 2015-03-29 DIAGNOSIS — M7989 Other specified soft tissue disorders: Secondary | ICD-10-CM | POA: Diagnosis not present

## 2015-03-29 DIAGNOSIS — C561 Malignant neoplasm of right ovary: Secondary | ICD-10-CM

## 2015-03-29 DIAGNOSIS — N838 Other noninflammatory disorders of ovary, fallopian tube and broad ligament: Secondary | ICD-10-CM

## 2015-03-29 DIAGNOSIS — N839 Noninflammatory disorder of ovary, fallopian tube and broad ligament, unspecified: Secondary | ICD-10-CM | POA: Diagnosis not present

## 2015-03-29 DIAGNOSIS — M79605 Pain in left leg: Secondary | ICD-10-CM | POA: Insufficient documentation

## 2015-03-29 DIAGNOSIS — D509 Iron deficiency anemia, unspecified: Secondary | ICD-10-CM

## 2015-03-29 DIAGNOSIS — C541 Malignant neoplasm of endometrium: Secondary | ICD-10-CM | POA: Diagnosis not present

## 2015-03-29 DIAGNOSIS — D500 Iron deficiency anemia secondary to blood loss (chronic): Secondary | ICD-10-CM

## 2015-03-29 DIAGNOSIS — Z6841 Body Mass Index (BMI) 40.0 and over, adult: Secondary | ICD-10-CM | POA: Diagnosis not present

## 2015-03-29 DIAGNOSIS — R600 Localized edema: Secondary | ICD-10-CM

## 2015-03-29 DIAGNOSIS — I878 Other specified disorders of veins: Secondary | ICD-10-CM

## 2015-03-29 LAB — CBC WITH DIFFERENTIAL/PLATELET
BASO%: 0.8 % (ref 0.0–2.0)
Basophils Absolute: 0.1 10*3/uL (ref 0.0–0.1)
EOS%: 2.8 % (ref 0.0–7.0)
Eosinophils Absolute: 0.2 10*3/uL (ref 0.0–0.5)
HCT: 31.4 % — ABNORMAL LOW (ref 34.8–46.6)
HGB: 9.9 g/dL — ABNORMAL LOW (ref 11.6–15.9)
LYMPH%: 21.9 % (ref 14.0–49.7)
MCH: 27.8 pg (ref 25.1–34.0)
MCHC: 31.5 g/dL (ref 31.5–36.0)
MCV: 88.3 fL (ref 79.5–101.0)
MONO#: 0.7 10*3/uL (ref 0.1–0.9)
MONO%: 9.2 % (ref 0.0–14.0)
NEUT%: 65.3 % (ref 38.4–76.8)
NEUTROS ABS: 5.3 10*3/uL (ref 1.5–6.5)
Platelets: 323 10*3/uL (ref 145–400)
RBC: 3.55 10*6/uL — AB (ref 3.70–5.45)
RDW: 18.4 % — ABNORMAL HIGH (ref 11.2–14.5)
WBC: 8.2 10*3/uL (ref 3.9–10.3)
lymph#: 1.8 10*3/uL (ref 0.9–3.3)

## 2015-03-29 LAB — COMPREHENSIVE METABOLIC PANEL
AST: 11 U/L (ref 5–34)
Albumin: 3 g/dL — ABNORMAL LOW (ref 3.5–5.0)
Alkaline Phosphatase: 64 U/L (ref 40–150)
Anion Gap: 10 mEq/L (ref 3–11)
BILIRUBIN TOTAL: 0.3 mg/dL (ref 0.20–1.20)
BUN: 12 mg/dL (ref 7.0–26.0)
CHLORIDE: 107 meq/L (ref 98–109)
CO2: 26 meq/L (ref 22–29)
Calcium: 9.6 mg/dL (ref 8.4–10.4)
Creatinine: 0.9 mg/dL (ref 0.6–1.1)
EGFR: 85 mL/min/{1.73_m2} — AB (ref >90)
GLUCOSE: 110 mg/dL (ref 70–140)
Potassium: 4 mEq/L (ref 3.5–5.1)
SODIUM: 143 meq/L (ref 136–145)
TOTAL PROTEIN: 6.9 g/dL (ref 6.4–8.3)

## 2015-03-29 LAB — IRON AND TIBC
%SAT: 16 % — ABNORMAL LOW (ref 21–57)
Iron: 40 ug/dL — ABNORMAL LOW (ref 41–142)
TIBC: 245 ug/dL (ref 236–444)
UIBC: 205 ug/dL (ref 120–384)

## 2015-03-29 NOTE — Progress Notes (Signed)
Followup of Consult: Gyn-Onc  CC:  Chief Complaint  Patient presents with  . ovarian mass    follow up    Assessment/Plan:  [name]  is a 49 y.o.  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  She is s/p exploratory laparotomy and TAH, BSO, optimal cytoreduction to no residual visible disease on 03/13/15.  She is doing well from a surgery standpoint.  Her wound is healing well with no signs of infection though there is some wound separation which she is packing with iodoform strip gauze.  I had an extensive 30 minute face to face discussion with [name].  Pathology results and the likely prognosis from this diagnosis. I discussed that carcinosarcoma is a relatively rare cell type for ovarian cancer and is associated with a worse prognosis than high-grade serous ovarian cancer at an increased chemotherapy resistance with increased risk for recurrence. I discussed that we should treat this with upfront curative intent chemotherapy with 6 cycles of adjuvant carboplatin and paclitaxel. We have her scheduled see my colleague Dr. Marko Plume to begin this. I believe that she is cleared from a postoperative standpoint to commence chemotherapy.   Doppler today was negative for LLE DVT.  I will see her back after cycle 6.   HPI: [name] is a very pleasant 49 year old woman with history of 2 prior cesarean sections who is seen initially in the hospital as an inpatient consultation at [facility] on 02/19/2015 for complex pelvic masses. The patient is a morbidly obese woman with a BMI 62 kg/m. She was admitted to [facility] on 02/18/2015 with pyelonephritis. CT imaging and then MRI imaging was performed to evaluate for an etiology for pyelonephritis. This identified A large pelvic mass which it to be arising from the right adnexa. It measured 13 x 9 x 17.5 cm and was irregular cystic and solid in nature. A second, but cystic and  solid lesion within the left adnexa measuring 4.4 x 6.5 x 5.3 cm. In the third cystic and solid mass was seen anterior to the uterus and superior to the bladder measuring 4.2 x 5.4 x 5.3 cm. She had no pelvic lymphadenopathy, no carcinomatosis, and CA 125 was normal at 21U/mL.  Her infection was adequately treated with IV antibiotics and her acute kidney injury which manifested with an elevated creatinine 12.4 spontaneously resolved during her hospital stay with avoidance of NSAID and good hydration. She was noted to be anemic during her hospitalization with a hemoglobin of 10.8. However this did not require transfusion and spontaneously improved over time.   Interval History: On 03/13/2015 she underwent an exploratory laparotomy TAH/BSO and radical tumor debulking for a stage IIIc carcinosarcoma of the right ovary and an incidentally found synchronous stage I a endometrial cancer. Tumor was found with a large right ovarian mass, smaller left ovarian mass and a carcinosarcoma implant was involving the anterior cul-de-sac between the bladder and lower uterine segment. All disease sites were completely resected with no macroscopic disease Rainey at the completion of debulking. The patient did well postoperatively and was discharged on postoperative day 3 as planned. When she came back for staple removal there was some areas of wound separation in the lower aspect of infertility incision packed with iodoform strip goal is that the patient has been changing twice a day. She has been taking Lovenox prophylaxis daily since surgery.  She reports left lower extremity edema left greater than right in the past  week.  Current Meds:  Outpatient Encounter Prescriptions as of 03/29/2015  Medication Sig  . clobetasol cream (TEMOVATE) 0.05 % Apply topically 2 (two) times daily. (Patient taking differently: Apply 1 application topically at bedtime as needed. )  . enoxaparin (LOVENOX) 40 MG/0.4ML injection Inject 0.4 mLs  (40 mg total) into the skin daily.  Marland Kitchen oxyCODONE (OXY IR/ROXICODONE) 5 MG immediate release tablet Take 1-2 tablets (5-10 mg total) by mouth every 4 (four) hours as needed for severe pain.   No facility-administered encounter medications on file as of 03/29/2015.    Allergy:  Allergies  Allergen Reactions  . Penicillins Rash    Has patient had a PCN reaction causing immediate rash, facial/tongue/throat swelling, SOB or lightheadedness with hypotension: no Has patient had a PCN reaction causing severe rash involving mucus membranes or skin necrosis: no Has patient had a PCN reaction that required hospitalization in the hospital at the time Has patient had a PCN reaction occurring within the last 10 years: no If all of the above answers are "NO", then may proceed with Cephalosporin use.     Social Hx:   Social History   Social History  . Marital Status: Legally Separated    Spouse Name: N/A  . Number of Children: N/A  . Years of Education: N/A   Occupational History  . Not on file.   Social History Main Topics  . Smoking status: Never Smoker   . Smokeless tobacco: Never Used  . Alcohol Use: 0.0 oz/week     Comment: Maybe wine every 6 months  . Drug Use: No  . Sexual Activity: Yes     Comment: Told she could not have more children in 1986   Other Topics Concern  . 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   Married x 23 years, recently separated (4/15).  No domestic violence.  + financial stress.     Lives previously with husband who moved out, now with daughter who has medical problems, including Hodgkin's disease, and granddaughter (age 52).     Uses seat belt.     Past Surgical Hx:  Past Surgical History  Procedure Laterality Date  . Colostomy takedown    . Colostomy  2008  . Cesarean section  1983 and 1984  . Tonsillectomy and adenoidectomy   1997  . Laparotomy N/A 03/13/2015    Procedure: EXPLORATORY LAPAROTOMY;  Surgeon: Everitt Amber, MD;  Location: [facility];  Service: Gynecology;  Laterality: N/A;  . Abdominal hysterectomy N/A 03/13/2015    Procedure: TOTAL HYSTERECTOMY ABDOMINAL BILATERAL SALPINGO OOPHORECTOMY RADICAL TUMOR [place];  Surgeon: Everitt Amber, MD;  Location: [facility];  Service: Gynecology;  Laterality: N/A;  . Bowel resection  03/13/2015    Procedure: SMALL BOWEL RESECTION;  Surgeon: Everitt Amber, MD;  Location: [facility];  Service: Gynecology;;  . Ventral hernia repair  03/13/2015    Procedure: HERNIA REPAIR VENTRAL ADULT;  Surgeon: Everitt Amber, MD;  Location: [facility];  Service: Gynecology;;    Past Medical Hx:  Past Medical History  Diagnosis Date  . Hypertension   . Hx of migraines   . IBS (irritable bowel syndrome)   . Diverticulitis with perforation 2010  . Family history of adverse reaction to anesthesia     pts daughter had difficulty awakening following anesthesia   . History of bronchitis   .  History of ear infections   . Kidney infection   . GERD (gastroesophageal reflux disease)   . Headache   . Morbid obesity ([place])   . Anemia   . Pyelonephritis   . Cough     Past Gynecological History:  C/s x 2  No LMP recorded. Patient is not currently having periods (Reason: Premenopausal).  Family Hx:  Family History  Problem Relation Age of Onset  . Diabetes Mother   . Hypertension Mother   . Hyperlipidemia Mother   . Hyperlipidemia Father   . Cancer Daughter 37    Hodgkins  . Diabetes Daughter   . Heart disease Neg Hx   . Stroke Neg Hx     Review of Systems:  Constitutional  Feels well,    ENT Normal appearing ears and nares bilaterally Skin/Breast  No rash, sores, jaundice, itching, dryness Cardiovascular  No chest pain, shortness of breath, or edema  Pulmonary  No cough or wheeze.  Gastro Intestinal  No nausea, vomitting, or diarrhoea. No bright red blood per rectum, nochange in bowel movement,  or constipation.  Genito Urinary  No frequency, urgency, dysuria,  Musculo Skeletal  No myalgia, arthralgia, joint swelling or pain  Neurologic  No weakness, numbness, change in gait,  Psychology  No depression, anxiety, insomnia.   Vitals:  Blood pressure 154/73, pulse 85, temperature 98.2 F (36.8 C), temperature source Oral, resp. rate 18, height 5' 5.5" (1.664 m), weight 378 lb 6.4 oz (171.641 kg), SpO2 100 %.  Physical Exam: WD in NAD Neck  Supple NROM, without any enlargements.  Lymph Node Survey No cervical supraclavicular or inguinal adenopathy Cardiovascular  Pulse normal rate, regularity and rhythm. S1 and S2 normal.  Lungs  Clear to auscultation bilateraly, without wheezes/crackles/rhonchi. Good air movement.  Skin  No rash/lesions/breakdown  Psychiatry  Alert and oriented to person, place, and time  Abdomen  Normoactive bowel sounds, abdomen soft, non-tender and obese without evidence of hernia. There is a large midline incision which is healing well. Gauze removed and replaced. No signs of infection. Back No CVA tenderness Genito Urinary  Vulva/vagina: Normal external female genitalia.  No lesions. No discharge or bleeding.  Bladder/urethra:  No lesions or masses, well supported bladder  Vagina: cuff in tact.  Cervix and uterus: surgically absent  Adnexa: no palpable masses. Rectal  Good tone, no masses no cul de sac nodularity.  Extremities  No bilateral cyanosis, clubbing or edema.   30 minutes of direct face to face counseling time was spent with the patient. This included discussion about prognosis, therapy recommendations and postoperative side effects and are beyond the scope of routine postoperative care.   Donaciano Eva, MD  03/29/2015, 4:08 PM

## 2015-03-29 NOTE — Progress Notes (Signed)
VASCULAR LAB PRELIMINARY  PRELIMINARY  PRELIMINARY  PRELIMINARY  Left lower extremity venous duplex completed.    Preliminary report:  Left:  No obvious evidence of DVT, superficial thrombosis, or Baker's cyst.  Lynden Flemmer, RVS 03/29/2015, 5:13 PM

## 2015-03-29 NOTE — Progress Notes (Signed)
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 female who is seen in consultation, alone for visit,  at the request of [name], for consideration of adjuvant chemotherapy for recently diagnosed IIB carcinosarcoma of right ovary.  Primary care is by Alison [name] with [facility]. Patient had intermittent right lower quadrant abdominal pain since Aug 2016, with pelvic US 11-30-14 with uterine fibroids (11.2 x 4.8 x 6.8 cm. 1.7 x 1.7 x 2.0 cm subserosal fibroid in the right uterine body. 6.3 x 3.6 x 5.7 cm probable exophytic/pedunculated fibroid along the anterior uterine fundus), endometrium poorly visualized, right ovary poorly visualized but measured 4 x 2.8 x 4.4 cm and left ovary 4.9 x 3.4 x 4.9 cm with probable corpus luteal cyst called. Note patient is morbidly obese, with BMI >60. She had some fevers during that time.  Patient was admitted to [facility] 02-18-15 thru 02-21-15 with pyelonephritis and acute renal failure (creatinine 12.4), both of those problems resolving entirely with treatment.  CT AP and MRI pelvis done to evaluate the pyelonephritis demonstrated cystic and solid pelvic mass appearing to be arising from the right adnexa 13 x 9 x 17.5 cm, with a second cystic and solid lesion within the left adnexa measuring 4.4 x 6.5 x 5.3 cm. A third cystic and solid mass was seen anterior to the uterus and superior to the bladder measuring 4.2 x 5.4 x 5.3 cm. She had no pelvic lymphadenopathy, no carcinomatosis, and CA 125 was normal at 21 U/mL. She was seen in consultation by [name], with exploratory laparotomy 03-23-15 with TAH BSO, partial small bowel  resection and radical debulking, with additional ventral hernia repair. She was hospitalized with the surgery from 12-6 thru 03-16-15, discharged on prophylactic lovenox which she continues, no post op complications in hospital. [name] were removed on 03-27-15, some separation of wound with wound care continuing by patient at home. Surgical pathology [id number]) was reviewed at [facility], with final diagnosis of IIB carcinosarcoma of right ovary with involvement of left ovary and uterine serosa, and IA grade 1 endometrioid adenocarcinoma of uterus. She saw [name] 03-29-15, with recommendation for adjuvant chemotherapy and follow up with her expected after 6 cycles of chemotherapy.  Patient has not attended chemotherapy education class as yet. Peripheral IV access is poor such that she will need PAC by IR for chemo.    REVIEW OF SYSTEMS  Constipation x few days after surgery resolved with colace, which she has not needed recently. No fever. Very light vaginal spotting since surgery, no other bleeding. Son administering lovenox injections. No significant pain in abdomen/ pelvis or at surgical area. Appetite better than just post op, tho feels full quickly. Bladder "different" sensation but no dysuria. No nausea or vomiting, occasional GERD unchanged, uses TUMS. No SOB or cough. New LE swelling L > R today, no tenderness. No chest pain. No cardiac symptoms. Occasional sinus congestion. No difficulty hearing. No active dental problems. Uses reading glasses. No thyroid disease. No noted changes in breasts. Only arthritis right knee. Weight was 386 at diagnosis. No peripheral neuropathy, no other neurologic symptoms. No history of blood clots. Is able to sleep. Peripheral IV  access has been difficult for scans, blood draws, in hospital. Remainder of full 10 point review of systems negative.   ALLERGIES: Penicillins  PAST MEDICAL/ SURGICAL HISTORY:    Temporary colostomy for perforated  diverticular disease 2008, with reversal C sections 1983 and 1984 Tonsillectomy Remote HTN not recently HAs which she refers to as migraines, none in several years, no scotomata but did have nausea associated, used Excedrin migraine. Mammograms at [facility], after visit noted last in EMR were 2010 No colonoscopy reports in this EMR  CURRENT MEDICATIONS: reviewed as listed now in EMR. Scripts for decadron, zofran, lorazepam, ferrous fumarate and EMLA.    SOCIAL HISTORY: separated, lives with daughter and 39 yo granddaughter in [place]. Has worked as Librarian, academic at [employer]; prior to surgery was working part time as she had also been in school prior to problems beginning 11-2014. Never smoker. No ETOH. Son also in [place], with 3 daughters ages 47,5,4.   FAMILY HISTORY:  Only cancer in family was Hodgkins in daughter at age 36 3 years ago, treated curatively with chemotherapy, did have PAC Mother HTN, DM Father elevated lipids Daughter also DM Son healthy Granddaughters healthy Sister DM          PHYSICAL EXAM:  height is 5' 5.5" (1.664 m) and weight is 378 lb 9.6 oz (171.732 kg). Her oral temperature is 98.2 F (36.8 C). Her blood pressure is 154/73 and her pulse is 85. Her respiration is 18 and oxygen saturation is 100%.  Alert, pleasant, cooperative lady looks generally comfortable, good historian. Respirations not labored RA. Able to get on and off exam table. Morbidly obese, BMI 62.2  HEENT: normal hair pattern. PERRL, not icteric. Oral mucosa moist and clear, no obvious dental problems. Neck supple without JVD or obvious thyroid mass.   RESPIRATORY: Diminished BS bases consistent with habitus otherwise clear to A and P  CARDIAC/ VASCULAR: peripheral pulses symmetrical and intact. Heart RRR no gallop, clear heart sounds  ABDOMEN: obese, soft, not obviously distended. Dressings intact to midline surgical wound, no drainage or bleeding, no surrounding erythem or  tenderness. Wound was checked by gyn oncology also today.  LYMPH NODES:no cervical, supraclavicular, axillary or inguinal adenopathy  BREASTS:bilaterally without dominant mass, skin or nipple findings  NEUROLOGIC: CN, motor, sensory, cerebellar nonfocal.  PSYCH appropriate mood and affect  SKIN:without rash, ecchymosis, petechiae  MUSCULOSKELETAL: symmetrical muscle mass. Back not tender. 1-2+ swelling right foot and lower leg.    LABORATORY DATA:  Results for orders placed or performed in visit on 03/29/15 (from the past 48 hour(s))  CBC with Differential     Status: Abnormal   Collection Time: 03/29/15  1:37 PM  Result Value Ref Range   WBC 8.2 3.9 - 10.3 10e3/uL   NEUT# 5.3 1.5 - 6.5 10e3/uL   HGB 9.9 (L) 11.6 - 15.9 g/dL   HCT 31.4 (L) 34.8 - 46.6 %   Platelets 323 145 - 400 10e3/uL   MCV 88.3 79.5 - 101.0 fL   MCH 27.8 25.1 - 34.0 pg   MCHC 31.5 31.5 - 36.0 g/dL   RBC 3.55 (L) 3.70 - 5.45 10e6/uL   RDW 18.4 (H) 11.2 - 14.5 %   lymph# 1.8 0.9 - 3.3 10e3/uL   MONO# 0.7 0.1 - 0.9 10e3/uL   Eosinophils Absolute 0.2 0.0 - 0.5 10e3/uL   Basophils Absolute 0.1 0.0 - 0.1 10e3/uL   NEUT% 65.3 38.4 - 76.8 %   LYMPH% 21.9 14.0 - 49.7 %   MONO%  9.2 0.0 - 14.0 %   EOS% 2.8 0.0 - 7.0 %   BASO% 0.8 0.0 - 2.0 %  Iron and TIBC     Status: Abnormal   Collection Time: 03/29/15  1:37 PM  Result Value Ref Range   Iron 40 (L) 41 - 142 ug/dL   TIBC 245 236 - 444 ug/dL   UIBC 205 120 - 384 ug/dL   %SAT 16 (L) 21 - 57 %  Comprehensive metabolic panel     Status: Abnormal   Collection Time: 03/29/15  1:37 PM  Result Value Ref Range   Sodium 143 136 - 145 mEq/L   Potassium 4.0 3.5 - 5.1 mEq/L   Chloride 107 98 - 109 mEq/L   CO2 26 22 - 29 mEq/L   Glucose 110 70 - 140 mg/dl    Comment: Glucose reference range is for nonfasting patients. Fasting glucose reference range is 70- 100.   BUN 12.0 7.0 - 26.0 mg/dL   Creatinine 0.9 0.6 - 1.1 mg/dL   Total Bilirubin 0.30 0.20 - 1.20 mg/dL    Alkaline Phosphatase 64 40 - 150 U/L   AST 11 5 - 34 U/L   ALT <9 0 - 55 U/L   Total Protein 6.9 6.4 - 8.3 g/dL   Albumin 3.0 (L) 3.5 - 5.0 g/dL   Calcium 9.6 8.4 - 10.4 mg/dL   Anion Gap 10 3 - 11 mEq/L   EGFR 85 (L) >90 ml/min/1.73 m2    Comment: eGFR is calculated using the CKD-EPI Creatinine Equation (2009)      PATHOLOGY:  FINAL for [name], [name] ([id number]) Patient: [name], [name] Collected: 03/13/2015 Client: [facility] Accession: [id number] Received: 03/13/2015 Everitt Amber, MD REPORT OF SURGICAL PATHOLOGY FINAL DIAGNOSIS Diagnosis 1. Adnexa - ovary +/- tube, neoplastic, right - MALIGNANT MIXED MESODERMAL TUMOR (CARCINOSARCOMA) OUTSIDE CONSULTATION DAIGNOSIS. - SEE DESCRIPTION. 2. Uterus and cervix, with left fallopian tube and ovary - ENDOMETRIAL ADENOCARCINOMA, ENDOMETRIOID-TYPE. NO MYOMETRIAL INVASION IDENTIFIED. - SEROSAL NODULE: MALIGNANT MIXED MESODERMAL TUMOR (CARCINOSARCOMA) OUTSIDE CONSULTATION DAIGNOSIS. - SEE MICROSCOPIC DESCRIPTION. - LEFT OVARY TUBE: MALIGNANT MIXED MESODERMAL TUMOR (CARCINOSARCOMA) OUTSIDE CONSULTATION DAIGNOSIS. - LEFT FALLOPIAN TUBE: UNREMARKABLE. Microscopic Comment 1. OVARY Specimen(s): Right ovary and fallopian tube. Procedure: (including lymph node sampling): Salpingo-oophorectomy. Primary tumor site (including laterality): Right ovary. Ovarian surface involvement: Yes. Ovarian capsule intact without fragmentation: No. Maximum tumor size (cm): 11.0 cm. Histologic type: Malignant mixed mesodermal tumor (carcinosarcoma). Grade: High grade, 3. Peritoneal implants: (specify invasive or non-invasive): N/A. Pelvic extension (list additional structures on separate lines and if involved): Uterine serosa and left ovary. Lymph nodes: number examined 0; number positive N/A. TNM code: pT2b, pNX, pMX. FIGO Stage (based on pathologic findings, needs clinical correlation): IIB. Comments: This case is sent to Alison. Annitta Jersey at [facility] and his diagnosis is malignant mixed mesodermal tumor (carcinosarcoma). The case was discussed with Alison. Denman Thompson on 03/23/15.  2. ONCOLOGY TABLE-UTERUS, CARCINOMA OR CARCINOSARCOMA Specimen: Uterus with left fallopian tube and ovary. Procedure: Hysterectomy. Lymph node sampling performed: No. Specimen integrity: Intact. Maximum tumor size: 6.0 cm. Histologic type: Endometrioid adenocarcinoma. Grade: 1. Myometrial invasion: 0 cm where myometrium is 3.0 cm in thickness. Cervical stromal involvement: No. Extent of involvement of other organs: Confined with the endometrium. Lymph - vascular invasion: No. Peritoneal washings: N/A. Lymph nodes: # examined 0; # positive N/A. TNM code: pT1a, pNX. FIGO Stage (based on pathologic findings, needs clinical correlation): IA. Comment: The malignant mixed mesodermal tumor (carcinosarcoma) involves  the serosa of the uterus as well as the left ovary.    [place], [place] E Collected: 03/13/2015 Client: [facility] Accession: [id number] Received: 03/13/2015 Everitt Thompson, MDEPORT Adequacy Reason Satisfactory For Evaluation. Diagnosis PERITONEAL WASHING(SPECIMEN 1 OF 1 COLLECTED 03/13/15): ATYPICAL CELLS PRESENT.   RADIOGRAPHY: TRANSABDOMINAL AND TRANSVAGINAL ULTRASOUND OF PELVIS 11-30-14  COMPARISON: None  FINDINGS: Uterus  Measurements: 11.2 x 4.8 x 6.8 cm. 1.7 x 1.7 x 2.0 cm subserosal fibroid in the right uterine body. 6.3 x 3.6 x 5.7 cm probable exophytic/pedunculated fibroid along the anterior uterine fundus.  Endometrium  Poorly visualized.  Right ovary  Measurements: 4.0 x 2.8 x 4.4 cm. Poorly visualized.  Left ovary  Measurements: 4.9 x 3.4 x 4.9 cm. Thick-walled probable corpus luteal cyst measuring 3.8 cm.  Other findings  No free fluid.  IMPRESSION: Probable 6.3 cm exophytic/pedunculated fibroid along the anterior uterine fundus. Additional 2.0 cm  subserosal fibroid in the right uterine body.  Endometrium is poorly visualized.  Thick-walled probable corpus luteal cyst in the left ovary.     EXAM: CT ABDOMEN AND PELVIS WITH CONTRAST 01-30-15  COMPARISON: CT abdomen and pelvis 08/02/2010. Pelvic ultrasound 11/30/2014.  FINDINGS: The visualized lung bases are clear.  The liver, gallbladder, spleen, adrenal glands, and pancreas are unremarkable. Slightly lobular contour of the kidneys with subtle areas of cortical thinning, more conspicuous on the left, may reflect scarring. Small nonobstructing calculi are present in the interpolar regions of both kidneys.  There is a small sliding hiatal hernia. A small ventral midline abdominal hernia is again noted containing a portion of the transverse colon without evidence of obstruction. A small right lower quadrant abdominal hernia containing a short segment of small bowel is also unchanged and without evidence of associated obstruction. There are 2 additional small abdominal wall hernias containing only fat, 1 on the right and 1 on the left. The appendix is not clearly identified.  Within the right adnexa is a large mass measuring approximately 11.7 x 9.5 x 10.1 cm. The majority of the mass is low in attenuation and possibly cystic, with some areas of increased density along its periphery which may reflect solid components/nodular wall thickening. There is an additional solid mass more inferiorly and anteriorly in the inferior pelvis anterior to the uterus which measures 8.0 x 5.4 cm (series 2, image 71) and may be associated with the larger right adnexal mass. Abnormal soft tissue extends in a contiguous fashion below this mass along the anterior aspect of the uterus over a length of approximately 11 cm. There is a 4.7 cm focus of heterogeneous soft tissue in the left adnexa which may reflect involvement of the left ovary. The bladder is not well seen and may be  completely decompressed and partially obscured by the nearby pelvic soft tissue masses.  A mildly enlarged aortocaval lymph node just below the level of the renal arteries measures 1.1 cm in short axis (previously 7 mm). No free fluid is seen. No suspicious lytic or blastic osseous lesions are identified. Mild thoracolumbar spondylosis is noted.  IMPRESSION: 1. 11.7 cm right adnexal mass (possibly mixed cystic and solid) with additional masslike soft tissue in the midline anterior pelvis and left adnexa, overall most concerning for ovarian neoplasm. Gynecological consultation is recommended, with consideration for pelvic MRI as clinically warranted. 2. Mildly enlarged 1.1 cm aortocaval lymph node, indeterminate.    CT ABDOMEN AND PELVIS WITH CONTRAST  02-18-15  COMPARISON: CT of the abdomen and pelvis from 01/30/2015  FINDINGS:  The visualized lung bases are clear. A tiny hiatal hernia is noted.  The liver and spleen are unremarkable in appearance. The gallbladder is within normal limits. The pancreas and adrenal glands are unremarkable.  Multiple areas of decreased enhancement are noted within the right kidney, concerning for right-sided pyelonephritis. Surrounding soft tissue stranding is noted. Small nonobstructing bilateral renal stones are seen, measuring up to 5 mm in size on the left. The left kidney is otherwise unremarkable. There is no evidence of hydronephrosis. No obstructing ureteral stones are seen.  No free fluid is identified. The small bowel is unremarkable in appearance. The stomach is within normal limits. No acute vascular abnormalities are seen.  The appendix is not definitely characterized. Minimal diverticulosis is noted at the splenic flexure of the colon. Herniation is noted of the mid transverse colon into a tiny umbilical hernia, without evidence for obstruction. The colon is otherwise unremarkable.  The bladder is relatively  decompressed and grossly unremarkable. As noted on the prior study, there are multiple complex masses within the pelvis, tracking about the left adnexa and uterus. Differences in measurement from the prior study are secondary to the plane of imaging. The largest mass measures 12.5 x 11.5 cm in size. No inguinal lymphadenopathy is seen.  No acute osseous abnormalities are identified.  IMPRESSION: 1. Areas of decreased enhancement within the right kidney, concerning for right-sided pyelonephritis, new from the prior study. Surrounding soft tissue inflammation noted. This likely explains the patient's symptoms. 2. Multiple large complex masses within the pelvis, measuring up to 12.5 cm in size. These raise concern for malignancy, likely ovarian in nature. Further evaluation is again recommended. 3. Tiny hiatal hernia noted. 4. Minimal diverticulosis at the splenic flexure of the colon. Herniation of the mid transverse colon into a tiny umbilical hernia, without evidence for obstruction.   EXAM: MRI PELVIS WITHOUT AND WITH CONTRAST 02-19-15  COMPARISON: CT on 02/18/2015  FINDINGS: A large complex cystic and solid mass is seen in the right adnexa and central pelvis superior to the uterus which measures 13.0 x 8.6 x 17.5 cm.  A second complex cystic and solid mass is seen in the left adnexa which measures 4.4 x 6.5 x 5.3 cm.  A third cystic and solid mass is seen in anterior to the uterus and superior to the urinary bladder which measures 4.2 x 5.4 x 5.3 cm.  A small amount of ascites is seen. No pelvic lymphadenopathy identified.  Prior C-section scar noted. Uterus is otherwise unremarkable in appearance. Urinary bladder is also unremarkable.  A small right-sided paraumbilical ventral abdominal wall hernia is again seen.  IMPRESSION: Multiple complex cystic and solid masses in both adnexal regions, right side larger than left, and anterior to the uterus. These  are suspicious for primary ovarian carcinoma, possibly bilateral, with peritoneal metastatic disease.  Small right-sided paraumbilical ventral hernia again noted.    EXAM: CHEST 2 VIEW  COMPARISON: Chest x-ray of March 20, 2009  FINDINGS: The lungs are adequately inflated and clear. The heart and pulmonary vascularity are normal. The mediastinum is normal in width. There is no pleural effusion. The bony thorax is unremarkable.  IMPRESSION: There is no active cardiopulmonary disease.   PACs images for CTs and CXR reviewed by MD.     Following this consultation, patient had LE venous dopplers done urgently at [facility]: Summary:  - Techncially difficult due to body habitus and swelling. - No evidence of deep vein or superficial thrombosis involving the  left lower extremity  and right common femoral vein. - No evidence of Baker&'s cyst on the left.     DISCUSSION: All of history above reviewed with patient, including course leading up to diagnosis, imaging information, surgery done and pathology information including outside review. We have discussed indications for adjuvant chemotherapy, including more aggressive features of carcinosarcoma as opposed to more typical ovarian malignancies. She understands that standard adjuvant therapy for carcinosarcoma is same chemotherapy that is typically used for other epithelioid ovarian malignancies. We have discussed taxol and carboplatin combination therapy, mentioned dose dense regimen but feel she will be well able to tolerate q 3 week dosing. We have mentioned premedication decadron for taxol, use and choices of antiemetics, outpatient follow up including blood counts, agree that PAC by IR is indicated due to poor venous access. She will have full chemotherapy education class prior to start of treatment. She understands that she will need someone to drive her to and from chemotherapy treatments.     IMPRESSION / PLAN:  1.IIB  carcinosarcoma of right ovary with involvement of left ovary and uterine serosa: post radical debulking with TAH BSO and portion of small bowel, by [name] on 03-13-15. Pathology reviewed at [place] 6 cycles of q 3 week taxol carboplatin beginning 04-11-15. Needs chemo education class. I will see her back on 04-10-15 and on 04-23-15. 2.IA grade 1 endometrioid adenocarcinoma of uterus, treated surgically 3.wound separation: patient instructed on wound care and packing by gyn oncology, who will follow up when she is at [facility] for other appointments also. 4.morbid obesity, BMI 62. Will start with carbo capped at 750 mg per [place] protocol, tho may consider increasing if tolerated including by renal function. Will ask [place] nutritionist to see.  5.poor peripheral venous access: needs power PAC by IR for chemo, requested. 6.pyelonephritis 02-2015, resolved 7.acute renal failure with creatinine 12.4 at presentation with pyelonephritis, resolved. Creatinine 0.9 by labs available after visit today. Encouraged good hydration and will follow renal function closely with carboplatin. 8.overdue mammograms, which we will address when possible. 9.no colonoscopy information in this EMR, will follow up with patient 10.history of "migraine" HAs: she is aware that zofran can cause HA, which are seen from start of treatment if so. If she develops migraines after zofran with cycle 1 chemo will treat as usual with benadryl and change the zofran if so. 11.perforated diverticular disease with temporary colostomy 2008. See ? Colonoscopy above 12.flu vaccine 12-12-14 13.C sections x 2 14. New LE swelling: no DVT by dopplers after visit today. Continue and complete prophylactic lovenox post op. Elevate legs. [employer] iron deficiency anemia by labs available after visit: add oral ferrous fumarate, f/u colonoscopy information  Patient had questions answered to her satisfaction and is in agreement with plans and  recommendations. She can contact this office for questions or concerns at any time prior to next scheduled visit. Chemo orders placed, carbo capped for first cycle at 750 mg with taxol dosed on actual weight. Granix requested in case needed (granix orders not signed). Managed care notified for preauth of chemo and granix Time spent 60 min, including >50% discussion and coordination of care. Cc Drs [name], Caryl Ada, MD 03/29/2015 5:07 PM

## 2015-03-29 NOTE — Patient Instructions (Signed)
Plan for your doppler today

## 2015-03-29 NOTE — Telephone Encounter (Signed)
No vm available,called and spoke with jerri at [place] lab and she will print a schedule for me

## 2015-03-30 ENCOUNTER — Telehealth: Payer: Self-pay

## 2015-03-30 ENCOUNTER — Other Ambulatory Visit: Payer: Self-pay | Admitting: Oncology

## 2015-03-30 DIAGNOSIS — C561 Malignant neoplasm of right ovary: Secondary | ICD-10-CM

## 2015-03-30 DIAGNOSIS — C541 Malignant neoplasm of endometrium: Secondary | ICD-10-CM

## 2015-03-30 MED ORDER — ONDANSETRON HCL 8 MG PO TABS: 8.0000 mg | ORAL_TABLET | Freq: Three times a day (TID) | ORAL | Status: DC | PRN

## 2015-03-30 MED ORDER — LORAZEPAM 1 MG PO TABS
ORAL_TABLET | ORAL | Status: DC
Start: 2015-03-30 — End: 2015-07-30

## 2015-03-30 MED ORDER — DEXAMETHASONE 4 MG PO TABS: ORAL_TABLET | ORAL | Status: DC

## 2015-03-30 MED ORDER — LIDOCAINE-PRILOCAINE 2.5-2.5 % EX CREA: TOPICAL_CREAM | CUTANEOUS | Status: DC

## 2015-03-30 MED ORDER — FERROUS FUMARATE 325 (106 FE) MG PO TABS: ORAL_TABLET | ORAL | Status: DC

## 2015-03-30 NOTE — Telephone Encounter (Signed)
Patient Demographics     Patient Name Sex DOB SSN Address Phone    [name], [name] Female 12-26-1965 [id number] [address] [place] [address] [phone] [facility]) [phone] (Mobile)      Message  Received: Today    Lennis Marion Downer, MD  Baruch Merl, RN           [employer] always fine   Oral iron in RN message attached to that lab   zofran 8 mg One every 8 hrs prn nausea Will not make drowsy #30 2 RF.  (Tell her that if HA after zofran at time of chemo or following chemo to let MD know)   Ativan 1 mg  1/2 - 1 tablet every 6 hrs SL or po prn nausea. Will make drowsy #20   Decadron 4 mg  Five tabs with food 12 hrs and 6 hrs prior to taxol  #10   RN please go over meds with her closer to first treatment ~ 1-4. I expect to see her 1-3, so RN could do it at time of visit   thanks  thanks

## 2015-03-30 NOTE — Telephone Encounter (Signed)
-----   Message from Gordy Levan, MD sent at 03/30/2015 11:44 AM EST ----- Also EMLA  1 lage tube with refills   thanks

## 2015-04-02 ENCOUNTER — Other Ambulatory Visit: Payer: Self-pay | Admitting: Oncology

## 2015-04-02 DIAGNOSIS — I878 Other specified disorders of veins: Secondary | ICD-10-CM | POA: Insufficient documentation

## 2015-04-02 DIAGNOSIS — D500 Iron deficiency anemia secondary to blood loss (chronic): Secondary | ICD-10-CM | POA: Insufficient documentation

## 2015-04-04 ENCOUNTER — Other Ambulatory Visit: Payer: Self-pay | Admitting: *Deleted

## 2015-04-04 DIAGNOSIS — C541 Malignant neoplasm of endometrium: Secondary | ICD-10-CM

## 2015-04-04 DIAGNOSIS — C561 Malignant neoplasm of right ovary: Secondary | ICD-10-CM

## 2015-04-04 MED ORDER — FERROUS FUMARATE 325 (106 FE) MG PO TABS: ORAL_TABLET | ORAL | Status: DC

## 2015-04-04 MED ORDER — FERROUS FUMARATE 325 (106 FE) MG PO TABS
ORAL_TABLET | ORAL | Status: DC
Start: 2015-04-04 — End: 2015-04-04

## 2015-04-04 NOTE — Telephone Encounter (Signed)
Reached [name] and reviewed the Iron studies and the ferrous fumarate directions. Reviewed time of decadron  pre meds as treatment is 0800 04-12-15.  Ms. Runck will bring bottles of medications to 04-10-15 appointment. if she has any questions. Review instructions for EMLA cream use with PAC. PAC insertion is 04-11-15 at 1130 in [facility].  Ms. Pillard verbalized understanding.

## 2015-04-06 ENCOUNTER — Other Ambulatory Visit: Payer: Self-pay

## 2015-04-06 DIAGNOSIS — C569 Malignant neoplasm of unspecified ovary: Secondary | ICD-10-CM

## 2015-04-06 DIAGNOSIS — C541 Malignant neoplasm of endometrium: Secondary | ICD-10-CM

## 2015-04-08 ENCOUNTER — Encounter (HOSPITAL_COMMUNITY): Payer: Self-pay | Admitting: Family Medicine

## 2015-04-08 ENCOUNTER — Emergency Department (HOSPITAL_COMMUNITY)
Admission: EM | Admit: 2015-04-08 | Discharge: 2015-04-09 | Disposition: A | Discharge status: Home / Self Care | Payer: BLUE CROSS/BLUE SHIELD | Attending: Emergency Medicine | Admitting: Emergency Medicine

## 2015-04-08 ENCOUNTER — Other Ambulatory Visit: Payer: Self-pay | Admitting: Oncology

## 2015-04-08 DIAGNOSIS — K91872 Postprocedural seroma of a digestive system organ or structure following a digestive system procedure: Secondary | ICD-10-CM | POA: Diagnosis not present

## 2015-04-08 DIAGNOSIS — T792XXA Traumatic secondary and recurrent hemorrhage and seroma, initial encounter: Secondary | ICD-10-CM

## 2015-04-08 DIAGNOSIS — K5900 Constipation, unspecified: Secondary | ICD-10-CM | POA: Diagnosis not present

## 2015-04-08 DIAGNOSIS — Z8709 Personal history of other diseases of the respiratory system: Secondary | ICD-10-CM | POA: Insufficient documentation

## 2015-04-08 DIAGNOSIS — Z79899 Other long term (current) drug therapy: Secondary | ICD-10-CM | POA: Insufficient documentation

## 2015-04-08 DIAGNOSIS — Z90710 Acquired absence of both cervix and uterus: Secondary | ICD-10-CM | POA: Diagnosis not present

## 2015-04-08 DIAGNOSIS — Z9889 Other specified postprocedural states: Secondary | ICD-10-CM | POA: Diagnosis not present

## 2015-04-08 DIAGNOSIS — Z87448 Personal history of other diseases of urinary system: Secondary | ICD-10-CM | POA: Insufficient documentation

## 2015-04-08 DIAGNOSIS — Z7952 Long term (current) use of systemic steroids: Secondary | ICD-10-CM | POA: Insufficient documentation

## 2015-04-08 DIAGNOSIS — D649 Anemia, unspecified: Secondary | ICD-10-CM | POA: Diagnosis not present

## 2015-04-08 DIAGNOSIS — Z7901 Long term (current) use of anticoagulants: Secondary | ICD-10-CM | POA: Diagnosis not present

## 2015-04-08 DIAGNOSIS — Z880 Allergy status to penicillin: Secondary | ICD-10-CM | POA: Diagnosis not present

## 2015-04-08 DIAGNOSIS — Z8542 Personal history of malignant neoplasm of other parts of uterus: Secondary | ICD-10-CM | POA: Insufficient documentation

## 2015-04-08 DIAGNOSIS — Z8543 Personal history of malignant neoplasm of ovary: Secondary | ICD-10-CM | POA: Insufficient documentation

## 2015-04-08 DIAGNOSIS — I10 Essential (primary) hypertension: Secondary | ICD-10-CM | POA: Diagnosis not present

## 2015-04-08 DIAGNOSIS — Z8669 Personal history of other diseases of the nervous system and sense organs: Secondary | ICD-10-CM | POA: Insufficient documentation

## 2015-04-08 DIAGNOSIS — IMO0001 Reserved for inherently not codable concepts without codable children: Secondary | ICD-10-CM

## 2015-04-08 DIAGNOSIS — G8918 Other acute postprocedural pain: Secondary | ICD-10-CM | POA: Diagnosis present

## 2015-04-08 DIAGNOSIS — T888XXA Other specified complications of surgical and medical care, not elsewhere classified, initial encounter: Secondary | ICD-10-CM

## 2015-04-08 DIAGNOSIS — C561 Malignant neoplasm of right ovary: Secondary | ICD-10-CM

## 2015-04-08 HISTORY — DX: Malignant neoplasm of endometrium: C54.1

## 2015-04-08 HISTORY — DX: Malignant neoplasm of unspecified ovary: C56.9

## 2015-04-08 NOTE — ED Notes (Signed)
Patient had abd surgery on 03/13/1015. Pt was having to pack the wound twice a day. Pt noticed on Friday, the area was hard, swollen, and redness. Pt has ovarian cancer and denies any fever.

## 2015-04-09 ENCOUNTER — Emergency Department (HOSPITAL_COMMUNITY): Payer: BLUE CROSS/BLUE SHIELD

## 2015-04-09 ENCOUNTER — Encounter (HOSPITAL_COMMUNITY): Payer: Self-pay | Admitting: Emergency Medicine

## 2015-04-09 LAB — CBC WITH DIFFERENTIAL/PLATELET
BASOS PCT: 1 %
Basophils Absolute: 0 10*3/uL (ref 0.0–0.1)
EOS ABS: 0.5 10*3/uL (ref 0.0–0.7)
EOS PCT: 6 %
HCT: 30.8 % — ABNORMAL LOW (ref 36.0–46.0)
HEMOGLOBIN: 9.6 g/dL — AB (ref 12.0–15.0)
LYMPHS ABS: 2.4 10*3/uL (ref 0.7–4.0)
Lymphocytes Relative: 30 %
MCH: 28.7 pg (ref 26.0–34.0)
MCHC: 31.2 g/dL (ref 30.0–36.0)
MCV: 91.9 fL (ref 78.0–100.0)
Monocytes Absolute: 0.8 10*3/uL (ref 0.1–1.0)
Monocytes Relative: 10 %
NEUTROS PCT: 55 %
Neutro Abs: 4.3 10*3/uL (ref 1.7–7.7)
PLATELETS: 332 10*3/uL (ref 150–400)
RBC: 3.35 MIL/uL — AB (ref 3.87–5.11)
RDW: 16.6 % — ABNORMAL HIGH (ref 11.5–15.5)
WBC: 7.9 10*3/uL (ref 4.0–10.5)

## 2015-04-09 LAB — I-STAT CHEM 8, ED
BUN: 16 mg/dL (ref 6–20)
CALCIUM ION: 1.17 mmol/L (ref 1.12–1.23)
CHLORIDE: 105 mmol/L (ref 101–111)
Creatinine, Ser: 1 mg/dL (ref 0.44–1.00)
Glucose, Bld: 100 mg/dL — ABNORMAL HIGH (ref 65–99)
HEMATOCRIT: 30 % — AB (ref 36.0–46.0)
Hemoglobin: 10.2 g/dL — ABNORMAL LOW (ref 12.0–15.0)
Potassium: 4.8 mmol/L (ref 3.5–5.1)
SODIUM: 139 mmol/L (ref 135–145)
TCO2: 28 mmol/L (ref 0–100)

## 2015-04-09 MED ORDER — DOXYCYCLINE HYCLATE 100 MG PO CAPS: 100.0000 mg | ORAL_CAPSULE | Freq: Two times a day (BID) | ORAL | Status: DC

## 2015-04-09 MED ORDER — DOXYCYCLINE HYCLATE 100 MG PO TABS
100.0000 mg | ORAL_TABLET | Freq: Once | ORAL | Status: AC
  Administered 2015-04-09: 100 mg via ORAL
  Filled 2015-04-09: qty 1

## 2015-04-09 MED ORDER — IOHEXOL 300 MG/ML  SOLN
100.0000 mL | Freq: Once | INTRAMUSCULAR | Status: AC | PRN
  Administered 2015-04-09: 100 mL via INTRAVENOUS

## 2015-04-09 NOTE — ED Provider Notes (Signed)
CSN: [id number]     Arrival date & time 04/08/15  2255 History  By signing my name below, I, Soijett Blue, attest that this documentation has been prepared under the direction and in the presence of Tahisha Hakim, MD. Electronically Signed: Soijett Blue, ED Scribe. 04/09/2015. 12:35 AM.   Chief Complaint  Patient presents with  . Post-op Problem      Patient is a 50 y.o. female presenting with rash. The history is provided by the patient. No language interpreter was used.  Rash Location:  Torso Torso rash location:  Abd RLQ Quality: swelling   Quality: not weeping   Severity:  Moderate Onset quality:  Gradual Timing:  Constant Progression:  Worsening Chronicity:  New Context: not sun exposure   Relieved by:  Nothing Worsened by:  Nothing tried Ineffective treatments:  None tried Associated symptoms: no tongue swelling     [name] is a 50 y.o. female with a medical hx of HTN and Ovarian CA who presents to the Emergency Department complaining of post-op problem onset 3 days. She reports that she had surgery at [facility] on 03/13/15 and she had packing to the area that closed a couple days following. She states that she after the area closed she felt a hardened area to the surgical site. She denies her surgeon, Dr. Denman George knowing of the area as of now. She states that she starts her chemo on Thursday. She notes that she has tried iron supplement for the relief of her symptoms. She denies cough, n/v/d, and any other symptoms.    Past Medical History  Diagnosis Date  . Hypertension   . Hx of migraines   . IBS (irritable bowel syndrome)   . Diverticulitis with perforation 2010  . Family history of adverse reaction to anesthesia     pts daughter had difficulty awakening following anesthesia   . History of bronchitis   . History of ear infections   . Kidney infection   . GERD (gastroesophageal reflux disease)   . Headache   . Morbid obesity ([place])   . Anemia   . Pyelonephritis    . Cough   . Ovarian cancer ([place])   . Endometrial cancer [facility])    Past Surgical History  Procedure Laterality Date  . Colostomy takedown    . Colostomy  2008  . Cesarean section  1983 and 1984  . Tonsillectomy and adenoidectomy  1997  . Laparotomy N/A 03/13/2015    Procedure: EXPLORATORY LAPAROTOMY;  Surgeon: Everitt Amber, MD;  Location: [facility];  Service: Gynecology;  Laterality: N/A;  . Abdominal hysterectomy N/A 03/13/2015    Procedure: TOTAL HYSTERECTOMY ABDOMINAL BILATERAL SALPINGO OOPHORECTOMY RADICAL TUMOR [place];  Surgeon: Everitt Amber, MD;  Location: [facility];  Service: Gynecology;  Laterality: N/A;  . Bowel resection  03/13/2015    Procedure: SMALL BOWEL RESECTION;  Surgeon: Everitt Amber, MD;  Location: [facility];  Service: Gynecology;;  . Ventral hernia repair  03/13/2015    Procedure: HERNIA REPAIR VENTRAL ADULT;  Surgeon: Everitt Amber, MD;  Location: [facility];  Service: Gynecology;;   Family History  Problem Relation Age of Onset  . Diabetes Mother   . Hypertension Mother   . Hyperlipidemia Mother   . Hyperlipidemia Father   . Cancer Daughter 84    Hodgkins  . Diabetes Daughter   . Heart disease Neg Hx   . Stroke Neg Hx    Social History  Substance Use Topics  . Smoking status: Never Smoker   .  Smokeless tobacco: Never Used  . Alcohol Use: 0.0 oz/week     Comment: Maybe wine every 6 months   OB History    No data available     Review of Systems  Skin: Positive for color change and rash.  All other systems reviewed and are negative.     Allergies  Penicillins  Home Medications   Prior to Admission medications   Medication Sig Start Date End Date Taking? Authorizing Provider  clobetasol cream (TEMOVATE) 0.05 % Apply topically 2 (two) times daily. Patient taking differently: Apply 1 application topically at bedtime as needed.  12/12/14   Alveda Reasons, MD  dexamethasone (DECADRON) 4 MG tablet Take 5 tabs with food 12 hrs and 6 hrs prior to taxol chemotherapy.  03/30/15   Lennis Marion Downer, MD  enoxaparin (LOVENOX) 40 MG/0.4ML injection Inject 0.4 mLs (40 mg total) into the skin daily. 03/16/15   Dorothyann Gibbs, NP  ferrous fumarate (HEMOCYTE - 106 MG FE) 325 (106 Fe) MG TABS tablet Take 1 tablet daily on an empty stomach with OJ or Vitamin C tablet 04/04/15   Lennis P Livesay, MD  lidocaine-prilocaine (EMLA) cream Apply to PAC site 1-2 hours prior to access as needed. 03/30/15   Lennis Marion Downer, MD  LORazepam (ATIVAN) 1 MG tablet Place 1/2 to 1 tablet under the tongue or swallow every 6 hours as needed for nausea. Will make drowsy. 03/30/15   Lennis Marion Downer, MD  ondansetron (ZOFRAN) 8 MG tablet Take 1 tablet (8 mg total) by mouth every 8 (eight) hours as needed for nausea or vomiting (Will not make drowsy). 03/30/15   Lennis Marion Downer, MD  oxyCODONE (OXY IR/ROXICODONE) 5 MG immediate release tablet Take 1-2 tablets (5-10 mg total) by mouth every 4 (four) hours as needed for severe pain. 03/16/15   Melissa D Cross, NP   BP 169/87 mmHg  Pulse 87  Temp(Src) 98.1 F (36.7 C) (Oral)  Resp 18  Ht 5\' 5"  (1.651 m)  Wt 366 lb (166.017 kg)  BMI 60.91 kg/m2  SpO2 100%  LMP 10/16/2013 (Approximate) Physical Exam  Constitutional: She is oriented to person, place, and time. She appears well-developed and well-nourished. No distress.  HENT:  Head: Normocephalic and atraumatic.  Mouth/Throat: Oropharynx is clear and moist. No oropharyngeal exudate.  Eyes: EOM are normal. Pupils are equal, round, and reactive to light.  Neck: Normal range of motion. Neck supple.  Cardiovascular: Normal rate, regular rhythm and normal heart sounds.  Exam reveals no gallop and no friction rub.   No murmur heard. Pulmonary/Chest: Effort normal and breath sounds normal. No respiratory distress. She has no wheezes. She has no rales.  Abdominal: Soft. Bowel sounds are normal. There is no tenderness at McBurney's point and negative Murphy's sign.    Erythema along the incision, 2  inches.   Musculoskeletal: Normal range of motion.  Neurological: She is alert and oriented to person, place, and time. She has normal reflexes. She displays normal reflexes.  Skin: Skin is warm and dry.  Psychiatric: She has a normal mood and affect. Her behavior is normal.  Nursing note and vitals reviewed.   ED Course  Procedures (including critical care time) DIAGNOSTIC STUDIES: Oxygen Saturation is 100% on RA, nl by my interpretation.    COORDINATION OF CARE: 12:34 AM Discussed treatment plan with pt at bedside which includes labs and pt agreed to plan.   Labs Review Labs Reviewed  CBC WITH DIFFERENTIAL/PLATELET - Abnormal; Notable for  the following:    RBC 3.35 (*)    Hemoglobin 9.6 (*)    HCT 30.8 (*)    RDW 16.6 (*)    All other components within normal limits  I-STAT CHEM 8, ED - Abnormal; Notable for the following:    Glucose, Bld 100 (*)    Hemoglobin 10.2 (*)    HCT 30.0 (*)    All other components within normal limits    Imaging Review No results found. I have personally reviewed and evaluated these lab results as part of my medical decision-making.   EKG Interpretation None      MDM   Final diagnoses:  None    Medications  iohexol (OMNIPAQUE) 300 MG/ML solution 100 mL (100 mLs Intravenous Contrast Given 04/09/15 0302)  doxycycline (VIBRA-TABS) tablet 100 mg (100 mg Oral Given 04/09/15 0507)   Results for orders placed or performed during the hospital encounter of 04/08/15  CBC with Differential/Platelet  Result Value Ref Range   WBC 7.9 4.0 - 10.5 K/uL   RBC 3.35 (L) 3.87 - 5.11 MIL/uL   Hemoglobin 9.6 (L) 12.0 - 15.0 g/dL   HCT 30.8 (L) 36.0 - 46.0 %   MCV 91.9 78.0 - 100.0 fL   MCH 28.7 26.0 - 34.0 pg   MCHC 31.2 30.0 - 36.0 g/dL   RDW 16.6 (H) 11.5 - 15.5 %   Platelets 332 150 - 400 K/uL   Neutrophils Relative % 55 %   Neutro Abs 4.3 1.7 - 7.7 K/uL   Lymphocytes Relative 30 %   Lymphs Abs 2.4 0.7 - 4.0 K/uL   Monocytes Relative 10 %    Monocytes Absolute 0.8 0.1 - 1.0 K/uL   Eosinophils Relative 6 %   Eosinophils Absolute 0.5 0.0 - 0.7 K/uL   Basophils Relative 1 %   Basophils Absolute 0.0 0.0 - 0.1 K/uL  I-Stat Chem 8, ED  Result Value Ref Range   Sodium 139 135 - 145 mmol/L   Potassium 4.8 3.5 - 5.1 mmol/L   Chloride 105 101 - 111 mmol/L   BUN 16 6 - 20 mg/dL   Creatinine, Ser 1.00 0.44 - 1.00 mg/dL   Glucose, Bld 100 (H) 65 - 99 mg/dL   Calcium, Ion 1.17 1.12 - 1.23 mmol/L   TCO2 28 0 - 100 mmol/L   Hemoglobin 10.2 (L) 12.0 - 15.0 g/dL   HCT 30.0 (L) 36.0 - 46.0 %   Ct Abdomen Pelvis W Contrast  04/09/2015  CLINICAL DATA:  Swelling, redness and firmness about the anterior abdominal wall wound. Laparotomy with abdominal hysterectomy, bowel resection, and ventral hernia repair 1 month prior 03/13/2015. EXAM: CT ABDOMEN AND PELVIS WITH CONTRAST TECHNIQUE: Multidetector CT imaging of the abdomen and pelvis was performed using the standard protocol following bolus administration of intravenous contrast. CONTRAST:  142mL OMNIPAQUE IOHEXOL 300 MG/ML  SOLN COMPARISON:  Preoperative CT 02/18/2015 FINDINGS: Lower chest: The included lung bases are clear. No pulmonary nodule in the included lung bases. Small hiatal hernia is again seen. Liver: Prominent in size, no focal lesion. Hepatobiliary: Gallbladder physiologically distended, no calcified gallstone. Pancreas: No ductal dilatation.  No peripancreatic inflammation. Spleen: No focal abnormality. Adrenal glands: No nodule. Kidneys: Symmetric renal enhancement and excretion. Lobular renal contours. Previous left nephrolithiasis partially obscured by a phase of contrast. No hydronephrosis. Stomach/Bowel: Detailed assessment limited given lack of enteric contrast. Stomach is decompressed. Small bowel loops are decompressed. Enteric chain sutures in the right lower quadrant with either fluid-filled small bowel  are a small fluid collection measuring 4.3 x 2.2 cm. Decompressed bowel loops  versus anterior ommental nodularity in the right lower anterior abdomen (43-55). Bowel loops appear adherent to the anterior abdominal wall. There is a large volume of colonic stool, probable stool ball seen in the hepatic flexure of the colon. Vascular/Lymphatic: Multiple small mesenteric lymph nodes. Aortocaval lymph node measures 8 mm. There is a new left inguinal lymph node measuring 11 mm. Reproductive: Post resection of pelvic masses and hysterectomy. There is a 2.1 cm soft tissue density in the region of the right vaginal cuff. Bladder: Decompressed and not well evaluated. A surgical clip seen near the bladder dome. Other: Postsurgical change in the anterior abdominal wall. There is a fluid collection measuring 7.9 x 5.1 cm in the lower abdominal/ pelvic subcutaneous tissues. Mild adjacent soft tissue stranding, no internal air. Left upper quadrant ventral abdominal wall hernia contains only fat. No deep pelvic fluid collection. No definite ascites. Musculoskeletal: There are no acute or suspicious osseous abnormalities. IMPRESSION: 1. Fluid collection in the lower anterior abdominal wall measures 7.9 x 5.1 cm. There is adjacent soft tissue stranding. This may reflect postoperative seroma or abscess. 2. Post right lower quadrant bowel wall resection. Fluid adjacent to the enteric sutures in the right lower quadrant may be fluid-filled small bowel versus small fluid collection, this measures 4.3 x 2.2 cm. There is no deep pelvic fluid collection. 3. Post hysterectomy and resection of pelvic masses. There is soft tissue density adjacent to the right aspect of the vaginal cuff measuring 2.1 cm, of indeterminate etiology. 4. Non-opacified bowel loops versus omental soft tissue density in the lower anterior abdomen. Attention to this at follow-up versus PET characterization may be helpful. 5. Large stool burden. 6. Multiple small mesenteric lymph nodes. Increased size of left inguinal lymph node,, no 11 mm short  axis dimension. This is indeterminate and may be reactive versus secondary to malignancy. Electronically Signed   By: Jeb Levering M.D.   On: 04/09/2015 03:48   Case d/w Dr. Glo Herring.  Lesion on CT is likely a seroma.  Start doxycycline BID and Dr. Glo Herring will call GYN oncology staff to arrange follow up for the patient on Tuesday.    Case d/w Dr. Radene Knee.  Patient to be contacted by IR for possible seroma drainage.    Patient informed to be called for follow up with GYN onc.    I personally performed the services described in this documentation, which was scribed in my presence. The recorded information has been reviewed and is accurate.     Veatrice Kells, MD 04/09/15 [phone]

## 2015-04-09 NOTE — ED Notes (Signed)
Attempted to draw blood on pt x2, however both attempts unsuccessful.

## 2015-04-09 NOTE — Discharge Instructions (Signed)
Constipation, Adult  Miralax twice daily for one week then once daily Constipation is when a person has fewer than three bowel movements a week, has difficulty having a bowel movement, or has stools that are dry, hard, or larger than normal. As people grow older, constipation is more common. A low-fiber diet, not taking in enough fluids, and taking certain medicines may make constipation worse.  CAUSES   Certain medicines, such as antidepressants, pain medicine, iron supplements, antacids, and water pills.   Certain diseases, such as diabetes, irritable bowel syndrome (IBS), thyroid disease, or depression.   Not drinking enough water.   Not eating enough fiber-rich foods.   Stress or travel.   Lack of physical activity or exercise.   Ignoring the urge to have a bowel movement.   Using laxatives too much.  SIGNS AND SYMPTOMS   Having fewer than three bowel movements a week.   Straining to have a bowel movement.   Having stools that are hard, dry, or larger than normal.   Feeling full or bloated.   Pain in the lower abdomen.   Not feeling relief after having a bowel movement.  DIAGNOSIS  Your health care provider will take a medical history and perform a physical exam. Further testing may be done for severe constipation. Some tests may include:  A barium enema X-ray to examine your rectum, colon, and, sometimes, your small intestine.   A sigmoidoscopy to examine your lower colon.   A colonoscopy to examine your entire colon. TREATMENT  Treatment will depend on the severity of your constipation and what is causing it. Some dietary treatments include drinking more fluids and eating more fiber-rich foods. Lifestyle treatments may include regular exercise. If these diet and lifestyle recommendations do not help, your health care provider may recommend taking over-the-counter laxative medicines to help you have bowel movements. Prescription medicines may be  prescribed if over-the-counter medicines do not work.  HOME CARE INSTRUCTIONS   Eat foods that have a lot of fiber, such as fruits, vegetables, whole grains, and beans.  Limit foods high in fat and processed sugars, such as french fries, hamburgers, cookies, candies, and soda.   A fiber supplement may be added to your diet if you cannot get enough fiber from foods.   Drink enough fluids to keep your urine clear or pale yellow.   Exercise regularly or as directed by your health care provider.   Go to the restroom when you have the urge to go. Do not hold it.   Only take over-the-counter or prescription medicines as directed by your health care provider. Do not take other medicines for constipation without talking to your health care provider first.  [place] IF:   You have bright red blood in your stool.   Your constipation lasts for more than 4 days or gets worse.   You have abdominal or rectal pain.   You have thin, pencil-like stools.   You have unexplained weight loss. MAKE SURE YOU:   Understand these instructions.  Will watch your condition.  Will get help right away if you are not doing well or get worse.   This information is not intended to replace advice given to you by your health care provider. Make sure you discuss any questions you have with your health care provider.   Document Released: 12/21/2003 Document Revised: 04/14/2014 Document Reviewed: 01/03/2013 Elsevier Interactive Patient Education [employer].

## 2015-04-10 ENCOUNTER — Other Ambulatory Visit: Payer: Self-pay | Admitting: Radiology

## 2015-04-10 ENCOUNTER — Ambulatory Visit (HOSPITAL_BASED_OUTPATIENT_CLINIC_OR_DEPARTMENT_OTHER): Payer: BLUE CROSS/BLUE SHIELD | Admitting: Oncology

## 2015-04-10 ENCOUNTER — Telehealth: Payer: Self-pay | Admitting: Oncology

## 2015-04-10 ENCOUNTER — Encounter: Payer: Self-pay | Admitting: Oncology

## 2015-04-10 ENCOUNTER — Telehealth: Payer: Self-pay | Admitting: Gynecologic Oncology

## 2015-04-10 ENCOUNTER — Ambulatory Visit: Payer: BLUE CROSS/BLUE SHIELD

## 2015-04-10 ENCOUNTER — Other Ambulatory Visit (HOSPITAL_BASED_OUTPATIENT_CLINIC_OR_DEPARTMENT_OTHER): Payer: BLUE CROSS/BLUE SHIELD

## 2015-04-10 VITALS — BP 157/93 | HR 98 | Temp 98.2°F | Resp 21 | Ht 65.0 in | Wt 365.8 lb

## 2015-04-10 DIAGNOSIS — C541 Malignant neoplasm of endometrium: Secondary | ICD-10-CM | POA: Diagnosis not present

## 2015-04-10 DIAGNOSIS — C561 Malignant neoplasm of right ovary: Secondary | ICD-10-CM

## 2015-04-10 DIAGNOSIS — C562 Malignant neoplasm of left ovary: Secondary | ICD-10-CM

## 2015-04-10 DIAGNOSIS — IMO0001 Reserved for inherently not codable concepts without codable children: Secondary | ICD-10-CM

## 2015-04-10 DIAGNOSIS — Z6841 Body Mass Index (BMI) 40.0 and over, adult: Secondary | ICD-10-CM

## 2015-04-10 DIAGNOSIS — T814XXA Infection following a procedure, initial encounter: Principal | ICD-10-CM

## 2015-04-10 DIAGNOSIS — C569 Malignant neoplasm of unspecified ovary: Secondary | ICD-10-CM

## 2015-04-10 DIAGNOSIS — T792XXD Traumatic secondary and recurrent hemorrhage and seroma, subsequent encounter: Secondary | ICD-10-CM

## 2015-04-10 DIAGNOSIS — T888XXD Other specified complications of surgical and medical care, not elsewhere classified, subsequent encounter: Secondary | ICD-10-CM

## 2015-04-10 DIAGNOSIS — D500 Iron deficiency anemia secondary to blood loss (chronic): Secondary | ICD-10-CM

## 2015-04-10 DIAGNOSIS — D509 Iron deficiency anemia, unspecified: Secondary | ICD-10-CM

## 2015-04-10 DIAGNOSIS — I878 Other specified disorders of veins: Secondary | ICD-10-CM

## 2015-04-10 LAB — CBC WITH DIFFERENTIAL/PLATELET
BASO%: 1 % (ref 0.0–2.0)
Basophils Absolute: 0.1 10*3/uL (ref 0.0–0.1)
EOS%: 5.7 % (ref 0.0–7.0)
Eosinophils Absolute: 0.4 10*3/uL (ref 0.0–0.5)
HEMATOCRIT: 31.1 % — AB (ref 34.8–46.6)
HEMOGLOBIN: 10.2 g/dL — AB (ref 11.6–15.9)
LYMPH#: 1.6 10*3/uL (ref 0.9–3.3)
LYMPH%: 23 % (ref 14.0–49.7)
MCH: 28.7 pg (ref 25.1–34.0)
MCHC: 32.7 g/dL (ref 31.5–36.0)
MCV: 88 fL (ref 79.5–101.0)
MONO#: 0.8 10*3/uL (ref 0.1–0.9)
MONO%: 10.9 % (ref 0.0–14.0)
NEUT%: 59.4 % (ref 38.4–76.8)
NEUTROS ABS: 4.2 10*3/uL (ref 1.5–6.5)
PLATELETS: 289 10*3/uL (ref 145–400)
RBC: 3.54 10*6/uL — AB (ref 3.70–5.45)
RDW: 17.2 % — AB (ref 11.2–14.5)
WBC: 7.1 10*3/uL (ref 3.9–10.3)

## 2015-04-10 LAB — COMPREHENSIVE METABOLIC PANEL
ALK PHOS: 62 U/L (ref 40–150)
ANION GAP: 9 meq/L (ref 3–11)
AST: 11 U/L (ref 5–34)
Albumin: 3.2 g/dL — ABNORMAL LOW (ref 3.5–5.0)
BILIRUBIN TOTAL: 0.32 mg/dL (ref 0.20–1.20)
BUN: 13.3 mg/dL (ref 7.0–26.0)
CO2: 27 meq/L (ref 22–29)
CREATININE: 1.1 mg/dL (ref 0.6–1.1)
Calcium: 9.9 mg/dL (ref 8.4–10.4)
Chloride: 105 mEq/L (ref 98–109)
EGFR: 72 mL/min/{1.73_m2} — ABNORMAL LOW (ref >90)
GLUCOSE: 134 mg/dL (ref 70–140)
Potassium: 4.5 mEq/L (ref 3.5–5.1)
Sodium: 141 mEq/L (ref 136–145)
TOTAL PROTEIN: 7 g/dL (ref 6.4–8.3)

## 2015-04-10 MED ORDER — CEPHALEXIN 500 MG PO CAPS: 500.0000 mg | ORAL_CAPSULE | Freq: Four times a day (QID) | ORAL | Status: DC

## 2015-04-10 NOTE — Progress Notes (Signed)
Met with patient today in person to discuss financial assistance per Dr.Livesay. Patient had concerns of medication cost and treatment as well as scans and not being able to contribute to personal bills. Patient lives with her Ezequiel [place] and grandchild. Patient has not worked since October and does not qualify for STD. Currently has no income and is out of work through February pending treatment. 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 Gave patient applications for [employer] and [employer] to complete and bring back to me to submit. Approved patient for [facility] $400 grant. Gave patient approval and expense sheet. Also provided outpatient pharmacy information. Emailed Adonis Huguenin to also see if there are additional funds available for her through [employer]. Will contact patient on decision once I hear back from [place]. Patient has my card for any additional questions or concerns.

## 2015-04-10 NOTE — Patient Instructions (Signed)
Fine to take ativan (lorazepam) at bedtime night of chemo, whether or not any nausea, to give some additional coverage for nausea. This will make you drowsy.  Fine to take zofran (ondansetron) AM after chemo, whether or not any nausea, to give some additional coverage for nausea. This will not make you drowsy.  Stool softener once or twice daily is a good idea if any trouble moving bowels well every day. Can also use miralax (glycolax) daily in addition if needed.  Try claritin or generic lortadine one tablet daily starting day after chemo, for ~ 5 days, as this can help with taxol aches.   Call if any questions or concerns   [phone]

## 2015-04-10 NOTE — Progress Notes (Signed)
In addition to my previous note, applied for copay assistance through [employer]. Patient approved from 10/12/14-04/09/16. Once letter and POE is received, will send to billing. [name] emailed back and additional funds of $400 awarded to patient for [employer]. Informed patient and she was very grateful.

## 2015-04-10 NOTE — Progress Notes (Signed)
Obtained approval letter and POE from [employer] portal. Emailed to Emily/Bobbi in billing. Faxed signed physician form to [employer]. Fax received ok per confirmation sheet.

## 2015-04-10 NOTE — Progress Notes (Signed)
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 She attended chemotherapy education class after my consultation visit, and will have PAC placed by IR on 04-11-15  Exploratory laparotomy with TAH BSO partial small bowel resection, radical debulking and ventral hernia repair was done by Dr Denman George on 03-23-15. Patient was seen at ED on 04-09-15 for erythema and swelling at right side of surgical incision; she was not febrile and area was sore but not severe pain. CT AP done in ED 04-09-15 showed possible fluid collection RLQ. She was given one dose or oral doxycycline in ED, however was unable to afford copay on the same prescription, so has not taken any antibiotic in last 24 hours. Per patient, the erythema and firmness adjacent to incision improved after the single antibiotic dose in ED. She has persistant mild discomfort in that area. She has had no fever or chills, no vomiting and bowels are moving.  Discussed with gyn oncology now.  Otherwise she completed last post op prophylactic lovenox on 04-09-15. She has been eating, bladder is fine, no bleeding, no SOB or respiratory symptoms, no LE swelling.  Remainder of 10 point Review of Systems negative.   ONCOLOGIC HISTORY Patient had intermittent right lower quadrant abdominal pain since Aug 2016, with pelvic US 11-30-14 with uterine fibroids (11.2 x 4.8 x 6.8 cm. 1.7 x 1.7 x 2.0 cm subserosal fibroid in the right uterine body. 6.3 x 3.6 x 5.7 cm probable exophytic/pedunculated fibroid along the anterior uterine fundus), endometrium poorly visualized, right ovary poorly visualized but measured 4 x 2.8 x 4.4 cm and left ovary 4.9 x 3.4 x 4.9 cm with probable corpus luteal cyst  called. Note patient is morbidly obese, with BMI >60. She had some fevers during that time. Patient was admitted to [facility] 02-18-15 thru 02-21-15 with pyelonephritis and acute renal failure (creatinine 12.4), both of those problems resolving entirely with treatment. CT AP and MRI pelvis done to evaluate the pyelonephritis demonstrated cystic and solid pelvic mass appearing to be arising from the right adnexa 13 x 9 x 17.5 cm, with a second cystic and solid lesion within the left adnexa measuring 4.4 x 6.5 x 5.3 cm. A third cystic and solid mass was seen anterior to the uterus and superior to the bladder measuring 4.2 x 5.4 x 5.3 cm. She had no pelvic lymphadenopathy, no carcinomatosis, and CA 125 was normal at 21 U/mL. She was seen in consultation by Dr Denman George, with exploratory laparotomy 03-23-15 with TAH BSO, partial small bowel resection and radical debulking, with additional ventral hernia repair. She was hospitalized with the surgery from 12-6 thru 03-16-15, discharged on prophylactic lovenox which she continues, no post op complications in hospital. Jodell Cipro were removed on 03-27-15, some separation of wound with wound care continuing by patient at home. Surgical pathology [id number]) was reviewed at [facility], with final diagnosis of IIB carcinosarcoma of right ovary with involvement of left ovary and uterine serosa, and IA grade 1 endometrioid adenocarcinoma of uterus. She saw Dr Denman George 03-29-15, with recommendation for adjuvant chemotherapy and follow up with gyn oncology expected after 6 cycles of chemotherapy.    Objective:  Vital signs in last 24 hours:  BP 157/93 mmHg  Pulse 98  Temp(Src)  98.2 F (36.8 C) (Oral)  Resp 21  Ht '5\' 5"'  (1.651 m)  Wt 365 lb 12.8 oz (165.926 kg)  BMI 60.87 kg/m2  SpO2 100%  LMP 10/16/2013 (Approximate) Weight down 12 lbs. Morbidly obese, does not appear in any distress. Alert, oriented and appropriate. Ambulatory without difficulty, able to get on  and off exam table. Respirations not labored RA. Very pleasant, good historian and mother very supportive.   HEENT:PERRL, sclerae not icteric. Oral mucosa moist without lesions, posterior pharynx clear.  Neck supple. No JVD.  Lymphatics:no cervical,supraclavicular or inguinal adenopathy Resp: clear to auscultation bilaterally and normal percussion bilaterally, some decreased breath sounds bases consistent with habitus Cardio: regular rate and rhythm. No gallop. GI: abdomen obese, soft, nontender to gentle palpation, not obviously distended, no appreciable mass or organomegaly. Some bowel sounds. Surgical incision closed other than 0.4 cm not quite closed mid incision with moisture there. Area of firmness ~ 1 x 2 cm to right of mid incision not tender, no erythema or heat in area corresponding to her history and to ED sketch.. Musculoskeletal/ Extremities: without pitting edema, cords, tenderness. Poor peripheral veins Neuro: no peripheral neuropathy. Otherwise nonfocal. PSYCH appropriate mood and affect Skin otherwise without rash, ecchymosis, petechiae  Wound also evaluated by gyn onc NP following MD visit today. Incision could not be opened with probe, may be seroma adjacent. Gyn onc to follow up when she is back at office on 04-12-15.   Lab Results:  Results for orders placed or performed in visit on 04/10/15  CBC with Differential  Result Value Ref Range   WBC 7.1 3.9 - 10.3 10e3/uL   NEUT# 4.2 1.5 - 6.5 10e3/uL   HGB 10.2 (L) 11.6 - 15.9 g/dL   HCT 31.1 (L) 34.8 - 46.6 %   Platelets 289 145 - 400 10e3/uL   MCV 88.0 79.5 - 101.0 fL   MCH 28.7 25.1 - 34.0 pg   MCHC 32.7 31.5 - 36.0 g/dL   RBC 3.54 (L) 3.70 - 5.45 10e6/uL   RDW 17.2 (H) 11.2 - 14.5 %   lymph# 1.6 0.9 - 3.3 10e3/uL   MONO# 0.8 0.1 - 0.9 10e3/uL   Eosinophils Absolute 0.4 0.0 - 0.5 10e3/uL   Basophils Absolute 0.1 0.0 - 0.1 10e3/uL   NEUT% 59.4 38.4 - 76.8 %   LYMPH% 23.0 14.0 - 49.7 %   MONO% 10.9 0.0 - 14.0 %    EOS% 5.7 0.0 - 7.0 %   BASO% 1.0 0.0 - 2.0 %  Comprehensive metabolic panel  Result Value Ref Range   Sodium 141 136 - 145 mEq/L   Potassium 4.5 3.5 - 5.1 mEq/L   Chloride 105 98 - 109 mEq/L   CO2 27 22 - 29 mEq/L   Glucose 134 70 - 140 mg/dl   BUN 13.3 7.0 - 26.0 mg/dL   Creatinine 1.1 0.6 - 1.1 mg/dL   Total Bilirubin 0.32 0.20 - 1.20 mg/dL   Alkaline Phosphatase 62 40 - 150 U/L   AST 11 5 - 34 U/L   ALT <9 0 - 55 U/L   Total Protein 7.0 6.4 - 8.3 g/dL   Albumin 3.2 (L) 3.5 - 5.0 g/dL   Calcium 9.9 8.4 - 10.4 mg/dL   Anion Gap 9 3 - 11 mEq/L   EGFR 72 (L) >90 ml/min/1.73 m2     Studies/Results:  No results found.  Medications: I have reviewed the patient's current medications. Keflex less expensive (some PCN allergy but hopefully will  tolerate cephalosporin) and she can fill that prescription to begin today, per discussion with gyn onc NP and pharmacy Stool softener and/ or miralax 1-2x daily as needed to keep bowels moving well daily She has started ferrous fumarate one daily on empty stomach with OJ Ativan night of chemo and zofran AM after chemo whether or not any nausea. Claritin daily x ~ 5 starting day after chemo, for taxol aches.  Note granix approved if needed based on counts week after chemo - would start if dropping significantly then, especially if surgical wound is still of concern.   DISCUSSION MD spoke directly with [facility] financial staff now, as patient has not yet been seen at that office, and could not afford copay for antibiotic from ED, tho she did fill antiemetics and decadron for upcoming chemo. Financial staff met with her now, applications for some assistance completed by MD after visit.   Patient understands that if fever, chills, increased problems at surgical site would need to delay PAC planned 1-4 and first chemo planned 1-5 however at present findings do not warrant delay for [facility] or chemo. She is comfortable with information given at chemo  education class. I have reviewed timing of premedication decadron for taxol, possible taxol aches, possible taxol neuropathy, antiemetics. I have suggested Claritin  Written information also given (see Patient Instructions)  Dr Irene Limbo has kindly agreed to see her next week in my absence, to check counts and chemo side effects ~ 1 week after first chemo. Gyn onc NP Melissa Cross will follow up surgical wound later this week and gyn onc should also be available if needed day of Dr Grier Mitts visit. I will see her with labs 04-23-15.  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 Pathology reviewed at [facility]. Plan 6 cycles of q 3 week taxol carboplatin beginning 04-12-15. I appreciate Dr Irene Limbo seeing her in my absence week of 04-16-15, then I will see her on 04-23-15. 2.synchronous IA grade 1 endometrioid adenocarcinoma of uterus, treated surgically 3 Wound seroma vs cellulitis: area of previous wound separation may have closed prematurely, with subsequent fluid collection adjacent to incision, seen in ED 1-2 with one dose doxycycline. Clinically improved in last 24 hours, gyn onc following and will begin Keflex today (due to financial constraints). If area worsens including fever or chills, would need to delay PAC and chemo this week. CT 04-09-15 findings as above. 4.morbid obesity, BMI 62. Will start with carbo capped at 750 mg per [facility] protocol, tho may consider increasing if tolerated including by renal function. Will ask [place] nutritionist to see.  5.poor peripheral venous access:  power PAC by IR for chemo 04-11-15 6.pyelonephritis 02-2015, resolved 7.acute renal failure with creatinine 12.4 at presentation with pyelonephritis, resolved. Creatinine 1.1 by labs today. Encouraged good hydration and will follow renal function closely with carboplatin. 8.overdue  mammograms, which we will address when possible. 9.history of "migraine" HAs: she is aware that zofran can cause HA, which are seen from start of treatment if so. If she develops migraines after zofran with cycle 1 chemo will treat as usual with benadryl and change the zofran if so. 10.perforated diverticular disease with temporary colostomy 2008. See ? Colonoscopy above 11.flu vaccine 12-12-14 12 no DVT by LE dopplers after 03-29-15. Completed post op prophylactic lovenox . [employer]. [employer].pt iron deficiency anemia by labs available  after visit: tolerating oral ferrous fumarate, continue.  f/u colonoscopy information 14.history of PCN allergy, which was not an anaphylactic reaction. Agree with plan to try oral cephalosporin now 15.financial concerns: appreciate assistance from [facility] financial staff   All questions answered and patient / mother are in agreement with plans and recommendations and know to call at any time if concerns or questions.  Chemo orders confirmed. Granix available if needed, not signed or scheduled depending on counts.Time spent 40 min including >50% counseling and coordination of care. Cc Drs Rexford Maus, Gladis Riffle, MD   04/11/2015, 11:25 AM

## 2015-04-10 NOTE — Telephone Encounter (Signed)
Patient presented to the office today after seeing Dr. Marko Plume for wound assessment.  She had been packing a small opening in the midline incision previously.  She states the opening closed up.  She had noticed increased firmness, swelling, and warmth the end of last week without fever or chills.  She reported some drainage and stated the swelling decreased after one day but she still went to the ED to make sure since our office were closed due to the holidays.  Stating she feels fine otherwise.  Incision assessed.  Increased warmth noted on bilateral aspects of the midline incision but more on the right abdomen.  Unable to find an opening on the incision.  0.25 cm of moist, pink tissue noted in the midline of the incision (pt stating that was where the hole used to be).  No increased tenderness with palpation of the abdomen.  Patient stating the swelling has improved.  Firmness noted on the right abdomen as well.  Patient stating she is unable to afford the doxycycline sent by the ED since it was $47 dollars and the financial assistants at [facility] stated they would not be able to assist with that prescription.    Called and spoke with the pharmacist at [facility] on 04/10/15 at 11:40am.  Pt's only allergy is to PCN.  Pharmacist recommended keflex 500 mg QID for seven days and the patient will only have to pay half the price that she would have paid for doxycycline.  Verbal order given for the keflex.  Called and informed patient of new prescription.  She states she can take cephalosporins to her knowledge.  She is to call the office for any questions or concerns.  I can also assess the wound on Thursday when she comes in for chemotherapy if she feels it is not healing or worsening.  Reportable signs and symptoms reviewed.  Advised to call for any questions or concerns.

## 2015-04-10 NOTE — Telephone Encounter (Signed)
Appointments added per 1/1 and 1/3 pof and patient will get a new avs at 04/12/15 chemo.patient to see dr Irene Limbo per pof as dr Marko Plume out 1/11

## 2015-04-11 ENCOUNTER — Ambulatory Visit (HOSPITAL_COMMUNITY)
Admission: RE | Admit: 2015-04-11 | Discharge: 2015-04-11 | Disposition: A | Discharge status: Home / Self Care | Payer: BLUE CROSS/BLUE SHIELD | Source: Ambulatory Visit | Attending: Oncology | Admitting: Oncology

## 2015-04-11 ENCOUNTER — Telehealth: Payer: Self-pay

## 2015-04-11 ENCOUNTER — Other Ambulatory Visit: Payer: Self-pay | Admitting: Oncology

## 2015-04-11 ENCOUNTER — Encounter (HOSPITAL_COMMUNITY): Payer: Self-pay

## 2015-04-11 DIAGNOSIS — N12 Tubulo-interstitial nephritis, not specified as acute or chronic: Secondary | ICD-10-CM | POA: Diagnosis not present

## 2015-04-11 DIAGNOSIS — C561 Malignant neoplasm of right ovary: Secondary | ICD-10-CM

## 2015-04-11 DIAGNOSIS — C541 Malignant neoplasm of endometrium: Secondary | ICD-10-CM | POA: Diagnosis not present

## 2015-04-11 DIAGNOSIS — D649 Anemia, unspecified: Secondary | ICD-10-CM | POA: Insufficient documentation

## 2015-04-11 DIAGNOSIS — Z90710 Acquired absence of both cervix and uterus: Secondary | ICD-10-CM | POA: Diagnosis not present

## 2015-04-11 DIAGNOSIS — I10 Essential (primary) hypertension: Secondary | ICD-10-CM | POA: Insufficient documentation

## 2015-04-11 DIAGNOSIS — Z880 Allergy status to penicillin: Secondary | ICD-10-CM | POA: Diagnosis not present

## 2015-04-11 DIAGNOSIS — K589 Irritable bowel syndrome without diarrhea: Secondary | ICD-10-CM | POA: Diagnosis not present

## 2015-04-11 DIAGNOSIS — G43909 Migraine, unspecified, not intractable, without status migrainosus: Secondary | ICD-10-CM | POA: Insufficient documentation

## 2015-04-11 DIAGNOSIS — K219 Gastro-esophageal reflux disease without esophagitis: Secondary | ICD-10-CM | POA: Diagnosis not present

## 2015-04-11 LAB — CBC WITH DIFFERENTIAL/PLATELET
BASOS ABS: 0 10*3/uL (ref 0.0–0.1)
Basophils Relative: 1 %
Eosinophils Absolute: 0.4 10*3/uL (ref 0.0–0.7)
Eosinophils Relative: 5 %
HEMATOCRIT: 31.5 % — AB (ref 36.0–46.0)
Hemoglobin: 9.7 g/dL — ABNORMAL LOW (ref 12.0–15.0)
LYMPHS ABS: 2.1 10*3/uL (ref 0.7–4.0)
LYMPHS PCT: 27 %
MCH: 27.6 pg (ref 26.0–34.0)
MCHC: 30.8 g/dL (ref 30.0–36.0)
MCV: 89.7 fL (ref 78.0–100.0)
Monocytes Absolute: 0.6 10*3/uL (ref 0.1–1.0)
Monocytes Relative: 8 %
Neutro Abs: 4.9 10*3/uL (ref 1.7–7.7)
Neutrophils Relative %: 61 %
Platelets: 289 10*3/uL (ref 150–400)
RBC: 3.51 MIL/uL — AB (ref 3.87–5.11)
RDW: 16 % — ABNORMAL HIGH (ref 11.5–15.5)
WBC: 8 10*3/uL (ref 4.0–10.5)

## 2015-04-11 LAB — PROTIME-INR
INR: 1.11 (ref 0.00–1.49)
Prothrombin Time: 14.5 seconds (ref 11.6–15.2)

## 2015-04-11 MED ORDER — FENTANYL CITRATE (PF) 100 MCG/2ML IJ SOLN
INTRAMUSCULAR | Status: AC
  Filled 2015-04-11: qty 2

## 2015-04-11 MED ORDER — MIDAZOLAM HCL 2 MG/2ML IJ SOLN
INTRAMUSCULAR | Status: AC | PRN
  Administered 2015-04-11: 1 mg via INTRAVENOUS
  Administered 2015-04-11: 0.5 mg via INTRAVENOUS
  Administered 2015-04-11: 1 mg via INTRAVENOUS
  Administered 2015-04-11: 0.5 mg via INTRAVENOUS

## 2015-04-11 MED ORDER — CLINDAMYCIN PHOSPHATE 900 MG/50ML IV SOLN
900.0000 mg | INTRAVENOUS | Status: AC
  Administered 2015-04-11: 900 mg via INTRAVENOUS
  Filled 2015-04-11: qty 50

## 2015-04-11 MED ORDER — HEPARIN SOD (PORK) LOCK FLUSH 100 UNIT/ML IV SOLN
INTRAVENOUS | Status: AC
  Filled 2015-04-11: qty 5

## 2015-04-11 MED ORDER — SODIUM CHLORIDE 0.9 % IV SOLN
INTRAVENOUS | Status: DC
  Administered 2015-04-11: 10:00:00 via INTRAVENOUS

## 2015-04-11 MED ORDER — HEPARIN SOD (PORK) LOCK FLUSH 100 UNIT/ML IV SOLN
INTRAVENOUS | Status: AC | PRN
  Administered 2015-04-11: 500 [IU]

## 2015-04-11 MED ORDER — FENTANYL CITRATE (PF) 100 MCG/2ML IJ SOLN
INTRAMUSCULAR | Status: AC | PRN
  Administered 2015-04-11: 25 ug via INTRAVENOUS
  Administered 2015-04-11: 50 ug via INTRAVENOUS
  Administered 2015-04-11: 25 ug via INTRAVENOUS

## 2015-04-11 MED ORDER — MIDAZOLAM HCL 2 MG/2ML IJ SOLN
INTRAMUSCULAR | Status: AC
  Filled 2015-04-11: qty 4

## 2015-04-11 MED ORDER — CLINDAMYCIN PHOSPHATE 900 MG/50ML IV SOLN: 900.0000 mg | Freq: Once | INTRAVENOUS | Status: DC

## 2015-04-11 MED ORDER — LIDOCAINE-EPINEPHRINE 2 %-1:100000 IJ SOLN
INTRAMUSCULAR | Status: AC
  Filled 2015-04-11: qty 1

## 2015-04-11 NOTE — Telephone Encounter (Signed)
[name] in radiology called asking if it was OK to go ahead with port placement while pt is on Keflex for abdominal wound. Per Joylene John NP, yesterday Dr Marko Plume said it was OK to do port during antibiotic treatment of abdominal wound. This was relayed to [place] on his pager.

## 2015-04-11 NOTE — Procedures (Signed)
Successful placement of right IJ approach port-a-cath with tip at the superior caval atrial junction. The catheter is ready for immediate use. No immediate post procedural complications.  Jay Jerrelle Michelsen, MD Pager #: [phone]   

## 2015-04-11 NOTE — H&P (Signed)
Chief Complaint: Patient was seen in consultation today for port a cath placement Referring Physician(s): Livesay,Lennis P  History of Present Illness: [name] is a 50 y.o. 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 She is s/p expl laparotomy with TAH/BSO, partial SB resection/radical debulking and ventral hernia repair on 03/23/15. She is currently on keflex for midline abd wound but has no significant drainage at site. She is afebrile and WBC is 8.0. She presents today for port a cath placement for chemotherapy.   Past Medical History  Diagnosis Date  . Hypertension   . Hx of migraines   . IBS (irritable bowel syndrome)   . Diverticulitis with perforation 2010  . Family history of adverse reaction to anesthesia     pts daughter had difficulty awakening following anesthesia   . History of bronchitis   . History of ear infections   . Kidney infection   . GERD (gastroesophageal reflux disease)   . Headache   . Morbid obesity ([place])   . Anemia   . Pyelonephritis   . Cough   . Ovarian cancer ([place])   . Endometrial cancer [facility])     Past Surgical History  Procedure Laterality Date  . Colostomy takedown    . Colostomy  2008  . Cesarean section  1983 and 1984  . Tonsillectomy and adenoidectomy  1997  . Laparotomy N/A 03/13/2015    Procedure: EXPLORATORY LAPAROTOMY;  Surgeon: Everitt Amber, MD;  Location: [facility];  Service: Gynecology;  Laterality: N/A;  . Abdominal hysterectomy N/A 03/13/2015    Procedure: TOTAL HYSTERECTOMY ABDOMINAL BILATERAL SALPINGO OOPHORECTOMY RADICAL TUMOR [place];  Surgeon: Everitt Amber, MD;  Location: [facility];  Service: Gynecology;  Laterality: N/A;  . Bowel resection  03/13/2015    Procedure: SMALL BOWEL RESECTION;  Surgeon: Everitt Amber, MD;  Location: [facility];  Service: Gynecology;;  . Ventral hernia repair  03/13/2015    Procedure: HERNIA REPAIR VENTRAL  ADULT;  Surgeon: Everitt Amber, MD;  Location: [facility];  Service: Gynecology;;    Allergies: Penicillins  Medications: Prior to Admission medications   Medication Sig Start Date End Date Taking? Authorizing Provider  cephALEXin (KEFLEX) 500 MG capsule Take 1 capsule (500 mg total) by mouth 4 (four) times daily. 04/10/15  Yes Melissa D Cross, NP  ferrous fumarate (HEMOCYTE - 106 MG FE) 325 (106 Fe) MG TABS tablet Take 1 tablet daily on an empty stomach with OJ or Vitamin C tablet 04/04/15  Yes Lennis P Livesay, MD  oxyCODONE (OXY IR/ROXICODONE) 5 MG immediate release tablet Take 1-2 tablets (5-10 mg total) by mouth every 4 (four) hours as needed for severe pain. 03/16/15  Yes Melissa D Cross, NP  clobetasol cream (TEMOVATE) 0.05 % Apply topically 2 (two) times daily. Patient taking differently: Apply 1 application topically at bedtime as needed.  12/12/14   Alveda Reasons, MD  dexamethasone (DECADRON) 4 MG tablet Take 5 tabs with food 12 hrs and 6 hrs prior to taxol chemotherapy. 03/30/15   Lennis Marion Downer, MD  doxycycline (VIBRAMYCIN) 100 MG capsule Take 1 capsule (100 mg total) by mouth 2 (two) times daily. One po bid x 7 days 04/09/15   April Palumbo, MD  enoxaparin (LOVENOX) 40 MG/0.4ML injection Inject 0.4 mLs (40 mg total) into the skin daily. 03/16/15   Dorothyann Gibbs, NP  lidocaine-prilocaine (EMLA) cream Apply to PAC site 1-2 hours prior to access as  needed. 03/30/15   Lennis Marion Downer, MD  LORazepam (ATIVAN) 1 MG tablet Place 1/2 to 1 tablet under the tongue or swallow every 6 hours as needed for nausea. Will make drowsy. 03/30/15   Lennis Marion Downer, MD  ondansetron (ZOFRAN) 8 MG tablet Take 1 tablet (8 mg total) by mouth every 8 (eight) hours as needed for nausea or vomiting (Will not make drowsy). 03/30/15   Lennis Marion Downer, MD     Family History  Problem Relation Age of Onset  . Diabetes Mother   . Hypertension Mother   . Hyperlipidemia Mother   . Hyperlipidemia Father   . Cancer  Daughter 47    Hodgkins  . Diabetes Daughter   . Heart disease Neg Hx   . Stroke Neg Hx     Social History   Social History  . Marital Status: Legally Separated    Spouse Name: N/A  . Number of Children: N/A  . Years of Education: N/A   Social History Main Topics  . Smoking status: Never Smoker   . Smokeless tobacco: Never Used  . Alcohol Use: 0.0 oz/week     Comment: Maybe wine every 6 months  . Drug Use: No  . Sexual Activity: Yes     Comment: Told she could not have more children in 1986   Other Topics Concern  . 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   Married x 23 years, recently separated (4/15).  No domestic violence.  + financial stress.     Lives previously with husband who moved out, now with daughter who has medical problems, including Hodgkin's disease, and granddaughter (age 1).     Uses seat belt.       Review of Systems  Constitutional: Negative for fever and chills.  Respiratory: Negative for cough and shortness of breath.   Cardiovascular: Negative for chest pain.  Gastrointestinal: Positive for abdominal pain. Negative for nausea, vomiting and blood in stool.  Genitourinary: Negative for dysuria and hematuria.  Musculoskeletal: Negative for back pain.  Neurological: Negative for headaches.    Vital Signs: BP 127/83 mmHg  Pulse 95  Temp(Src) 98.6 F (37 C) (Oral)  Resp 18  Ht 5\' 5"  (1.651 m)  Wt 365 lb 12.8 oz (165.926 kg)  BMI 60.87 kg/m2  SpO2 99%  LMP 10/16/2013 (Approximate)  Physical Exam  Constitutional: She is oriented to person, place, and time. She appears well-developed and well-nourished.  Cardiovascular: Normal rate and regular rhythm.   Pulmonary/Chest: Effort normal and breath sounds normal.  Abdominal: Soft. Bowel sounds are normal. There is tenderness.  Obese; midline abd wound site is clean and dry;  palpable firmness to right of midline, sl tender to palpation  Musculoskeletal: Normal range of motion. She exhibits edema.  Neurological: She is alert and oriented to person, place, and time.    Mallampati Score:     Imaging: Ct Abdomen Pelvis W Contrast  04/09/2015  CLINICAL DATA:  Swelling, redness and firmness about the anterior abdominal wall wound. Laparotomy with abdominal hysterectomy, bowel resection, and ventral hernia repair 1 month prior 03/13/2015. EXAM: CT ABDOMEN AND PELVIS WITH CONTRAST TECHNIQUE: Multidetector CT imaging of the abdomen and pelvis was performed using the standard protocol following bolus administration of intravenous contrast. CONTRAST:  179mL OMNIPAQUE IOHEXOL 300 MG/ML  SOLN COMPARISON:  Preoperative CT 02/18/2015 FINDINGS: Lower  chest: The included lung bases are clear. No pulmonary nodule in the included lung bases. Small hiatal hernia is again seen. Liver: Prominent in size, no focal lesion. Hepatobiliary: Gallbladder physiologically distended, no calcified gallstone. Pancreas: No ductal dilatation.  No peripancreatic inflammation. Spleen: No focal abnormality. Adrenal glands: No nodule. Kidneys: Symmetric renal enhancement and excretion. Lobular renal contours. Previous left nephrolithiasis partially obscured by a phase of contrast. No hydronephrosis. Stomach/Bowel: Detailed assessment limited given lack of enteric contrast. Stomach is decompressed. Small bowel loops are decompressed. Enteric chain sutures in the right lower quadrant with either fluid-filled small bowel are a small fluid collection measuring 4.3 x 2.2 cm. Decompressed bowel loops versus anterior ommental nodularity in the right lower anterior abdomen (43-55). Bowel loops appear adherent to the anterior abdominal wall. There is a large volume of colonic stool, probable stool ball seen in the hepatic flexure of the colon. Vascular/Lymphatic: Multiple small mesenteric lymph nodes. Aortocaval lymph node  measures 8 mm. There is a new left inguinal lymph node measuring 11 mm. Reproductive: Post resection of pelvic masses and hysterectomy. There is a 2.1 cm soft tissue density in the region of the right vaginal cuff. Bladder: Decompressed and not well evaluated. A surgical clip seen near the bladder dome. Other: Postsurgical change in the anterior abdominal wall. There is a fluid collection measuring 7.9 x 5.1 cm in the lower abdominal/ pelvic subcutaneous tissues. Mild adjacent soft tissue stranding, no internal air. Left upper quadrant ventral abdominal wall hernia contains only fat. No deep pelvic fluid collection. No definite ascites. Musculoskeletal: There are no acute or suspicious osseous abnormalities. IMPRESSION: 1. Fluid collection in the lower anterior abdominal wall measures 7.9 x 5.1 cm. There is adjacent soft tissue stranding. This may reflect postoperative seroma or abscess. 2. Post right lower quadrant bowel wall resection. Fluid adjacent to the enteric sutures in the right lower quadrant may be fluid-filled small bowel versus small fluid collection, this measures 4.3 x 2.2 cm. There is no deep pelvic fluid collection. 3. Post hysterectomy and resection of pelvic masses. There is soft tissue density adjacent to the right aspect of the vaginal cuff measuring 2.1 cm, of indeterminate etiology. 4. Non-opacified bowel loops versus omental soft tissue density in the lower anterior abdomen. Attention to this at follow-up versus PET characterization may be helpful. 5. Large stool burden. 6. Multiple small mesenteric lymph nodes. Increased size of left inguinal lymph node,, no 11 mm short axis dimension. This is indeterminate and may be reactive versus secondary to malignancy. Electronically Signed   By: Jeb Levering M.D.   On: 04/09/2015 03:48    Labs:  CBC:  Recent Labs  03/29/15 1337 04/09/15 0100 04/09/15 0112 04/10/15 0812 04/11/15 1028  WBC 8.2 7.9  --  7.1 8.0  HGB 9.9* 9.6* 10.2*  10.2* 9.7*  HCT 31.4* 30.8* 30.0* 31.1* 31.5*  PLT 323 332  --  289 289    COAGS: No results for input(s): INR, APTT in the last 8760 hours.  BMP:  Recent Labs  03/07/15 1135 03/14/15 0440 03/15/15 0445 03/15/15 1818 03/29/15 1337 04/09/15 0112 04/10/15 0812  NA 140 136 136 137 143 139 141  K 4.2 5.2* 4.6 4.6 4.0 4.8 4.5  CL 103 102 103 103  --  105  --   CO2 29 27 27 27 26   --  27  GLUCOSE 110* 160* 133* 149* 110 100* 134  BUN 7 13 18 16  12.0 16 13.3  CALCIUM 9.9 9.0 8.8* 8.5*  9.6  --  9.9  CREATININE 1.00 1.34* 1.49* 1.28* 0.9 1.00 1.1  GFRNONAA >60 45* 40* 48*  --   --   --   GFRAA >60 53* 46* 56*  --   --   --     LIVER FUNCTION TESTS:  Recent Labs  02/18/15 1753 03/07/15 1135 03/29/15 1337 04/10/15 0812  BILITOT 0.7 0.7 0.30 0.32  AST 26 24 11 11   ALT 16 13* <9 <9  ALKPHOS 118 58 64 62  PROT 7.9 7.6 6.9 7.0  ALBUMIN 3.2* 3.2* 3.0* 3.2*    TUMOR MARKERS: No results for input(s): AFPTM, CEA, CA199, CHROMGRNA in the last 8760 hours.  Assessment and Plan: 50 y.o. 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 She is s/p expl laparotomy with TAH/BSO, partial SB resection/radical debulking and ventral hernia repair on 03/23/15. She is currently on keflex for midline abd wound but has no significant drainage at site. She is afebrile and WBC is 8.0. She presents today for port a cath placement for chemotherapy.Risks and benefits discussed with the patient/daughter including, but not limited to bleeding, infection, pneumothorax, or fibrin sheath development and need for additional procedures.All of the patient's questions were answered, patient is agreeable to proceed.Consent signed and in chart. Per Dr. Marko Plume ok to proceed with port placement while pt on keflex therapy.      Thank you for this interesting consult.  I greatly enjoyed meeting PAITIN KALLIS and look forward  to participating in their care.  A copy of this report was sent to the requesting provider on this date.  Signed: D. Rowe Robert 04/11/2015, 10:51 AM   I spent a total of 15 minutes in face to face in clinical consultation, greater than 50% of which was counseling/coordinating care for port a cath placement

## 2015-04-11 NOTE — Discharge Instructions (Signed)

## 2015-04-12 ENCOUNTER — Ambulatory Visit (HOSPITAL_BASED_OUTPATIENT_CLINIC_OR_DEPARTMENT_OTHER): Payer: BLUE CROSS/BLUE SHIELD

## 2015-04-12 VITALS — BP 154/72 | HR 63 | Temp 98.2°F | Resp 18

## 2015-04-12 DIAGNOSIS — C561 Malignant neoplasm of right ovary: Secondary | ICD-10-CM | POA: Diagnosis not present

## 2015-04-12 DIAGNOSIS — Z5111 Encounter for antineoplastic chemotherapy: Secondary | ICD-10-CM | POA: Diagnosis not present

## 2015-04-12 MED ORDER — HEPARIN SOD (PORK) LOCK FLUSH 100 UNIT/ML IV SOLN
500.0000 [IU] | Freq: Once | INTRAVENOUS | Status: AC | PRN
  Administered 2015-04-12: 500 [IU]
  Filled 2015-04-12: qty 5

## 2015-04-12 MED ORDER — FAMOTIDINE IN NACL 20-0.9 MG/50ML-% IV SOLN
20.0000 mg | Freq: Once | INTRAVENOUS | Status: AC
  Administered 2015-04-12: 20 mg via INTRAVENOUS

## 2015-04-12 MED ORDER — FAMOTIDINE IN NACL 20-0.9 MG/50ML-% IV SOLN
INTRAVENOUS | Status: AC
  Filled 2015-04-12: qty 50

## 2015-04-12 MED ORDER — SODIUM CHLORIDE 0.9 % IV SOLN
Freq: Once | INTRAVENOUS | Status: AC
  Administered 2015-04-12: 09:00:00 via INTRAVENOUS

## 2015-04-12 MED ORDER — SODIUM CHLORIDE 0.9 % IJ SOLN
10.0000 mL | INTRAMUSCULAR | Status: DC | PRN
  Administered 2015-04-12: 10 mL
  Filled 2015-04-12: qty 10

## 2015-04-12 MED ORDER — DIPHENHYDRAMINE HCL 50 MG/ML IJ SOLN
INTRAMUSCULAR | Status: AC
  Filled 2015-04-12: qty 1

## 2015-04-12 MED ORDER — DIPHENHYDRAMINE HCL 50 MG/ML IJ SOLN
50.0000 mg | Freq: Once | INTRAMUSCULAR | Status: AC
  Administered 2015-04-12: 50 mg via INTRAVENOUS

## 2015-04-12 MED ORDER — SODIUM CHLORIDE 0.9 % IV SOLN
Freq: Once | INTRAVENOUS | Status: AC
  Administered 2015-04-12: 09:00:00 via INTRAVENOUS
  Filled 2015-04-12: qty 8

## 2015-04-12 MED ORDER — PACLITAXEL CHEMO INJECTION 300 MG/50ML
175.0000 mg/m2 | Freq: Once | INTRAVENOUS | Status: AC
  Administered 2015-04-12: 492 mg via INTRAVENOUS
  Filled 2015-04-12: qty 82

## 2015-04-12 MED ORDER — SODIUM CHLORIDE 0.9 % IV SOLN
750.0000 mg | Freq: Once | INTRAVENOUS | Status: AC
  Administered 2015-04-12: 750 mg via INTRAVENOUS
  Filled 2015-04-12: qty 75

## 2015-04-12 NOTE — Patient Instructions (Addendum)
[facility] Discharge Instructions for Patients Receiving Chemotherapy  Today you received the following chemotherapy agents Taxol/Carboplatin  To help prevent nausea and vomiting after your treatment, we encourage you to take your nausea medication    If you develop nausea and vomiting that is not controlled by your nausea medication, call the clinic.   BELOW ARE SYMPTOMS THAT SHOULD BE REPORTED IMMEDIATELY:  *FEVER GREATER THAN 100.5 F  *CHILLS WITH OR WITHOUT FEVER  NAUSEA AND VOMITING THAT IS NOT CONTROLLED WITH YOUR NAUSEA MEDICATION  *UNUSUAL SHORTNESS OF BREATH  *UNUSUAL BRUISING OR BLEEDING  TENDERNESS IN MOUTH AND THROAT WITH OR WITHOUT PRESENCE OF ULCERS  *URINARY PROBLEMS  *BOWEL PROBLEMS  UNUSUAL RASH Items with * indicate a potential emergency and should be followed up as soon as possible.  Feel free to call the clinic you have any questions or concerns. The clinic phone number is [phone].  Please show the CHEMO ALERT CARD at check-in to the Emergency Department and triage nurse.  Carboplatin injection What is this medicine? CARBOPLATIN (KAR boe pla tin) is a chemotherapy drug. It targets fast dividing cells, like cancer cells, and causes these cells to die. This medicine is used to treat ovarian cancer and many other cancers. This medicine may be used for other purposes; ask your health care provider or pharmacist if you have questions. What should I tell my health care provider before I take this medicine? They need to know if you have any of these conditions: -blood disorders -hearing problems -kidney disease -recent or ongoing radiation therapy -an unusual or allergic reaction to carboplatin, cisplatin, other chemotherapy, other medicines, foods, dyes, or preservatives -pregnant or trying to get pregnant -breast-feeding How should I use this medicine? This drug is usually given as an infusion into a vein. It is administered in a  hospital or clinic by a specially trained health care professional. Talk to your pediatrician regarding the use of this medicine in children. Special care may be needed. Overdosage: If you think you have taken too much of this medicine contact a poison control center or emergency room at once. NOTE: This medicine is only for you. Do not share this medicine with others. What if I miss a dose? It is important not to miss a dose. Call your doctor or health care professional if you are unable to keep an appointment. What may interact with this medicine? -medicines for seizures -medicines to increase blood counts like filgrastim, pegfilgrastim, sargramostim -some antibiotics like amikacin, gentamicin, neomycin, streptomycin, tobramycin -vaccines Talk to your doctor or health care professional before taking any of these medicines: -acetaminophen -aspirin -ibuprofen -ketoprofen -naproxen This list may not describe all possible interactions. Give your health care provider a list of all the medicines, herbs, non-prescription drugs, or dietary supplements you use. Also tell them if you smoke, drink alcohol, or use illegal drugs. Some items may interact with your medicine. What should I watch for while using this medicine? Your condition will be monitored carefully while you are receiving this medicine. You will need important blood work done while you are taking this medicine. This drug may make you feel generally unwell. This is not uncommon, as chemotherapy can affect healthy cells as well as cancer cells. Report any side effects. Continue your course of treatment even though you feel ill unless your doctor tells you to stop. In some cases, you may be given additional medicines to help with side effects. Follow all directions for their use. Call your   doctor or health care professional for advice if you get a fever, chills or sore throat, or other symptoms of a cold or flu. Do not treat yourself. This  drug decreases your body's ability to fight infections. Try to avoid being around people who are sick. This medicine may increase your risk to bruise or bleed. Call your doctor or health care professional if you notice any unusual bleeding. Be careful brushing and flossing your teeth or using a toothpick because you may get an infection or bleed more easily. If you have any dental work done, tell your dentist you are receiving this medicine. Avoid taking products that contain aspirin, acetaminophen, ibuprofen, naproxen, or ketoprofen unless instructed by your doctor. These medicines may hide a fever. Do not become pregnant while taking this medicine. Women should inform their doctor if they wish to become pregnant or think they might be pregnant. There is a potential for serious side effects to an unborn child. Talk to your health care professional or pharmacist for more information. Do not breast-feed an infant while taking this medicine. What side effects may I notice from receiving this medicine? Side effects that you should report to your doctor or health care professional as soon as possible: -allergic reactions like skin rash, itching or hives, swelling of the face, lips, or tongue -signs of infection - fever or chills, cough, sore throat, pain or difficulty passing urine -signs of decreased platelets or bleeding - bruising, pinpoint red spots on the skin, black, tarry stools, nosebleeds -signs of decreased red blood cells - unusually weak or tired, fainting spells, lightheadedness -breathing problems -changes in hearing -changes in vision -chest pain -high blood pressure -low blood counts - This drug may decrease the number of white blood cells, red blood cells and platelets. You may be at increased risk for infections and bleeding. -nausea and vomiting -pain, swelling, redness or irritation at the injection site -pain, tingling, numbness in the hands or feet -problems with balance,  talking, walking -trouble passing urine or change in the amount of urine Side effects that usually do not require medical attention (report to your doctor or health care professional if they continue or are bothersome): -hair loss -loss of appetite -metallic taste in the mouth or changes in taste This list may not describe all possible side effects. Call your doctor for medical advice about side effects. You may report side effects to FDA at 1-800-FDA-1088. Where should I keep my medicine? This drug is given in a hospital or clinic and will not be stored at home. NOTE: This sheet is a summary. It may not cover all possible information. If you have questions about this medicine, talk to your doctor, pharmacist, or health care provider.    2016, Elsevier/Gold Standard. (2007-06-29 14:38:05)  Paclitaxel injection What is this medicine? PACLITAXEL (PAK li TAX el) is a chemotherapy drug. It targets fast dividing cells, like cancer cells, and causes these cells to die. This medicine is used to treat ovarian cancer, breast cancer, and other cancers. This medicine may be used for other purposes; ask your health care provider or pharmacist if you have questions. What should I tell my health care provider before I take this medicine? They need to know if you have any of these conditions: -blood disorders -irregular heartbeat -infection (especially a virus infection such as chickenpox, cold sores, or herpes) -liver disease -previous or ongoing radiation therapy -an unusual or allergic reaction to paclitaxel, alcohol, polyoxyethylated castor oil, other chemotherapy agents, other medicines,   foods, dyes, or preservatives -pregnant or trying to get pregnant -breast-feeding How should I use this medicine? This drug is given as an infusion into a vein. It is administered in a hospital or clinic by a specially trained health care professional. Talk to your pediatrician regarding the use of this medicine  in children. Special care may be needed. Overdosage: If you think you have taken too much of this medicine contact a poison control center or emergency room at once. NOTE: This medicine is only for you. Do not share this medicine with others. What if I miss a dose? It is important not to miss your dose. Call your doctor or health care professional if you are unable to keep an appointment. What may interact with this medicine? Do not take this medicine with any of the following medications: -disulfiram -metronidazole This medicine may also interact with the following medications: -cyclosporine -diazepam -ketoconazole -medicines to increase blood counts like filgrastim, pegfilgrastim, sargramostim -other chemotherapy drugs like cisplatin, doxorubicin, epirubicin, etoposide, teniposide, vincristine -quinidine -testosterone -vaccines -verapamil Talk to your doctor or health care professional before taking any of these medicines: -acetaminophen -aspirin -ibuprofen -ketoprofen -naproxen This list may not describe all possible interactions. Give your health care provider a list of all the medicines, herbs, non-prescription drugs, or dietary supplements you use. Also tell them if you smoke, drink alcohol, or use illegal drugs. Some items may interact with your medicine. What should I watch for while using this medicine? Your condition will be monitored carefully while you are receiving this medicine. You will need important blood work done while you are taking this medicine. This drug may make you feel generally unwell. This is not uncommon, as chemotherapy can affect healthy cells as well as cancer cells. Report any side effects. Continue your course of treatment even though you feel ill unless your doctor tells you to stop. This medicine can cause serious allergic reactions. To reduce your risk you will need to take other medicine(s) before treatment with this medicine. In some cases, you may  be given additional medicines to help with side effects. Follow all directions for their use. Call your doctor or health care professional for advice if you get a fever, chills or sore throat, or other symptoms of a cold or flu. Do not treat yourself. This drug decreases your body's ability to fight infections. Try to avoid being around people who are sick. This medicine may increase your risk to bruise or bleed. Call your doctor or health care professional if you notice any unusual bleeding. Be careful brushing and flossing your teeth or using a toothpick because you may get an infection or bleed more easily. If you have any dental work done, tell your dentist you are receiving this medicine. Avoid taking products that contain aspirin, acetaminophen, ibuprofen, naproxen, or ketoprofen unless instructed by your doctor. These medicines may hide a fever. Do not become pregnant while taking this medicine. Women should inform their doctor if they wish to become pregnant or think they might be pregnant. There is a potential for serious side effects to an unborn child. Talk to your health care professional or pharmacist for more information. Do not breast-feed an infant while taking this medicine. Men are advised not to father a child while receiving this medicine. This product may contain alcohol. Ask your pharmacist or healthcare provider if this medicine contains alcohol. Be sure to tell all healthcare providers you are taking this medicine. Certain medicines, like metronidazole and disulfiram, can cause   an unpleasant reaction when taken with alcohol. The reaction includes flushing, headache, nausea, vomiting, sweating, and increased thirst. The reaction can last from 30 minutes to several hours. What side effects may I notice from receiving this medicine? Side effects that you should report to your doctor or health care professional as soon as possible: -allergic reactions like skin rash, itching or hives,  swelling of the face, lips, or tongue -low blood counts - This drug may decrease the number of white blood cells, red blood cells and platelets. You may be at increased risk for infections and bleeding. -signs of infection - fever or chills, cough, sore throat, pain or difficulty passing urine -signs of decreased platelets or bleeding - bruising, pinpoint red spots on the skin, black, tarry stools, nosebleeds -signs of decreased red blood cells - unusually weak or tired, fainting spells, lightheadedness -breathing problems -chest pain -high or low blood pressure -mouth sores -nausea and vomiting -pain, swelling, redness or irritation at the injection site -pain, tingling, numbness in the hands or feet -slow or irregular heartbeat -swelling of the ankle, feet, hands Side effects that usually do not require medical attention (report to your doctor or health care professional if they continue or are bothersome): -bone pain -complete hair loss including hair on your head, underarms, pubic hair, eyebrows, and eyelashes -changes in the color of fingernails -diarrhea -loosening of the fingernails -loss of appetite -muscle or joint pain -red flush to skin -sweating This list may not describe all possible side effects. Call your doctor for medical advice about side effects. You may report side effects to FDA at 1-800-FDA-1088. Where should I keep my medicine? This drug is given in a hospital or clinic and will not be stored at home. NOTE: This sheet is a summary. It may not cover all possible information. If you have questions about this medicine, talk to your doctor, pharmacist, or health care provider.    2016, Elsevier/Gold Standard. (2014-11-09 13:02:56)     

## 2015-04-13 ENCOUNTER — Encounter: Payer: Self-pay | Admitting: Gynecologic Oncology

## 2015-04-13 NOTE — Progress Notes (Signed)
Gynecologic Oncology Multi-Disciplinary Disposition Conference Note  Date of the Conference: March 26, 2015  Patient Name: [name]  Referring Provider: Initially seen inpatient Primary GYN Oncologist: Dr. 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  Disposition is to curative intent chemotherapy with six cycles of adjuvant carboplatin and paclitaxel.   This Multidisciplinary conference took place involving physicians from [place], [place], Radiation Oncology, Pathology, Radiology along with the Gynecologic Oncology Nurse Practitioner and RN.  Comprehensive assessment of the patient's malignancy, staging, need for surgery, chemotherapy, radiation therapy, and need for further testing were reviewed. Supportive measures, both inpatient and following discharge were also discussed. The recommended plan of care is documented. Greater than 35 minutes were spent correlating and coordinating this patient's care.

## 2015-04-16 ENCOUNTER — Telehealth: Payer: Self-pay

## 2015-04-16 NOTE — Telephone Encounter (Signed)
-----   Message from Azzie Glatter, RN sent at 04/12/2015  1:40 PM EST ----- Regarding: "1st time chemotherapy, per Dr. Marko Plume" Patient received Taxol and Carboplatin today for 1st time, per Dr. Marko Plume.  Tolerated treatment well.

## 2015-04-16 NOTE — Telephone Encounter (Signed)
Pt is drinking 60-80 oz water, she has lost some apetite. She is having leg and knee pains. They are just minimally decreased. Encouraged frequent small meals. Encouraged using tylenol and her oxycodone. She is constipated, LBM 2-3 days ago. She started using miralax this am. Told her to call if any other problems or increasing pain. She has next OV 1/11 with Dr Irene Limbo.

## 2015-04-18 ENCOUNTER — Encounter: Payer: Self-pay | Admitting: Hematology

## 2015-04-18 ENCOUNTER — Other Ambulatory Visit: Payer: Self-pay | Admitting: *Deleted

## 2015-04-18 ENCOUNTER — Ambulatory Visit (HOSPITAL_BASED_OUTPATIENT_CLINIC_OR_DEPARTMENT_OTHER): Payer: BLUE CROSS/BLUE SHIELD | Admitting: Hematology

## 2015-04-18 ENCOUNTER — Other Ambulatory Visit (HOSPITAL_BASED_OUTPATIENT_CLINIC_OR_DEPARTMENT_OTHER): Payer: BLUE CROSS/BLUE SHIELD

## 2015-04-18 VITALS — BP 127/67 | HR 113 | Temp 98.1°F | Resp 18 | Ht 65.0 in | Wt 349.2 lb

## 2015-04-18 DIAGNOSIS — C541 Malignant neoplasm of endometrium: Secondary | ICD-10-CM

## 2015-04-18 DIAGNOSIS — C569 Malignant neoplasm of unspecified ovary: Secondary | ICD-10-CM

## 2015-04-18 DIAGNOSIS — C562 Malignant neoplasm of left ovary: Secondary | ICD-10-CM

## 2015-04-18 DIAGNOSIS — C561 Malignant neoplasm of right ovary: Secondary | ICD-10-CM

## 2015-04-18 DIAGNOSIS — R300 Dysuria: Secondary | ICD-10-CM

## 2015-04-18 LAB — CBC WITH DIFFERENTIAL/PLATELET
BASO%: 0.2 % (ref 0.0–2.0)
BASOS ABS: 0 10*3/uL (ref 0.0–0.1)
EOS ABS: 0.2 10*3/uL (ref 0.0–0.5)
EOS%: 4.1 % (ref 0.0–7.0)
HEMATOCRIT: 36.7 % (ref 34.8–46.6)
HEMOGLOBIN: 11.7 g/dL (ref 11.6–15.9)
LYMPH#: 1.3 10*3/uL (ref 0.9–3.3)
LYMPH%: 26.8 % (ref 14.0–49.7)
MCH: 28.2 pg (ref 25.1–34.0)
MCHC: 31.9 g/dL (ref 31.5–36.0)
MCV: 88.4 fL (ref 79.5–101.0)
MONO#: 0 10*3/uL — AB (ref 0.1–0.9)
MONO%: 0.8 % (ref 0.0–14.0)
NEUT%: 68.1 % (ref 38.4–76.8)
NEUTROS ABS: 3.3 10*3/uL (ref 1.5–6.5)
PLATELETS: 257 10*3/uL (ref 145–400)
RBC: 4.15 10*6/uL (ref 3.70–5.45)
RDW: 15.4 % — AB (ref 11.2–14.5)
WBC: 4.8 10*3/uL (ref 3.9–10.3)

## 2015-04-18 LAB — COMPREHENSIVE METABOLIC PANEL
ALBUMIN: 3.9 g/dL (ref 3.5–5.0)
ALK PHOS: 63 U/L (ref 40–150)
ALT: 11 U/L (ref 0–55)
ANION GAP: 11 meq/L (ref 3–11)
AST: 15 U/L (ref 5–34)
BILIRUBIN TOTAL: 0.47 mg/dL (ref 0.20–1.20)
BUN: 19.5 mg/dL (ref 7.0–26.0)
CALCIUM: 10.7 mg/dL — AB (ref 8.4–10.4)
CO2: 26 mEq/L (ref 22–29)
Chloride: 99 mEq/L (ref 98–109)
Creatinine: 1.3 mg/dL — ABNORMAL HIGH (ref 0.6–1.1)
EGFR: 54 mL/min/{1.73_m2} — AB (ref >90)
GLUCOSE: 143 mg/dL — AB (ref 70–140)
POTASSIUM: 4.8 meq/L (ref 3.5–5.1)
SODIUM: 136 meq/L (ref 136–145)
TOTAL PROTEIN: 7.9 g/dL (ref 6.4–8.3)

## 2015-04-18 MED ORDER — OXYCODONE HCL 5 MG PO TABS: 5.0000 mg | ORAL_TABLET | ORAL | Status: DC | PRN

## 2015-04-18 MED FILL — oxyCODONE HCL 5 MG TABS: 5 | 3 days supply | Qty: 30 | Fill #0

## 2015-04-18 NOTE — Progress Notes (Signed)
Oncology clinic note     Date of service: .04/18/2015  Physicians:Emma Rexford Maus, Kayleen Memos, MD (PCP), Dr.Livesay  ONCOLOGIC HISTORY (as per note by Dr Marko Plume) Patient had intermittent right lower quadrant abdominal pain since Aug 2016, with pelvic US 11-30-14 with uterine fibroids (11.2 x 4.8 x 6.8 cm. 1.7 x 1.7 x 2.0 cm subserosal fibroid in the right uterine body. 6.3 x 3.6 x 5.7 cm probable exophytic/pedunculated fibroid along the anterior uterine fundus), endometrium poorly visualized, right ovary poorly visualized but measured 4 x 2.8 x 4.4 cm and left ovary 4.9 x 3.4 x 4.9 cm with probable corpus luteal cyst called. Note patient is morbidly obese, with BMI >60. She had some fevers during that time. Patient was admitted to [facility] 02-18-15 thru 02-21-15 with pyelonephritis and acute renal failure (creatinine 12.4), both of those problems resolving entirely with treatment. CT AP and MRI pelvis done to evaluate the pyelonephritis demonstrated cystic and solid pelvic mass appearing to be arising from the right adnexa 13 x 9 x 17.5 cm, with a second cystic and solid lesion within the left adnexa measuring 4.4 x 6.5 x 5.3 cm. A third cystic and solid mass was seen anterior to the uterus and superior to the bladder measuring 4.2 x 5.4 x 5.3 cm. She had no pelvic lymphadenopathy, no carcinomatosis, and CA 125 was normal at 21 U/mL. She was seen in consultation by Dr Denman George, with exploratory laparotomy 03-23-15 with TAH BSO, partial small bowel resection and radical debulking, with additional ventral hernia repair. She was hospitalized with the surgery from 12-6 thru 03-16-15, discharged on prophylactic lovenox which she continues, no post op complications in hospital. Jodell Cipro were removed on 03-27-15, some separation of wound with wound care continuing by patient at home. 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 She saw Dr Denman George 03-29-15, with recommendation for adjuvant chemotherapy and follow up with gyn oncology expected after 6 cycles of chemotherapy.  INTERVAL HISTORY  Mrs. Dillenbeck is your for follow-up after having started adjuvant carbotaxol chemotherapy for her ovarian carcinosarcoma on 04/11/2015. She notes some arthralgias and myalgias likely related to her Taxol which have now improved. No fevers or chills. No chest pain. No shortness of TaxDiscussions.si abdominal pain from her surgery which is improving. After clinic visit she noted some dysuria which was mild but was unable to come in to give a urine sample to do a urinalysis and urine culture. She is given prescription for Levaquin to 50 mg by mouth daily given that previous her cultures have shown Escherichia coli resistant to cephalosporins and Bactrim and she is allergic to penicillins. She was recommended not to start the antibiotic unless absolutely needed over the weekend. She was informed that she recently finish Keflex for possible abdominal wall skin infection and would try to avoid excessive antibiotic use unless absolutely needed and ideally should get a UA UC with Dr. Marko Plume on Monday. No other acute new concerns.  Objective:  Vital signs in last 24 hours:  BP 127/67 mmHg  Pulse 113  Temp(Src) 98.1 F (36.7 C) (Oral)  Resp 18  Ht 5' 5" (1.651 m)  Wt 349 lb 3.2 oz (158.396 kg)  BMI 58.11 kg/m2  SpO2 100%  LMP 10/16/2013 (Approximate)   . Wt Readings from Last 3 Encounters:  04/23/15 348 lb 14.4 oz (158.26 kg)  04/18/15 349 lb 3.2 oz (158.396 kg)  04/11/15 365 lb 12.8 oz (165.926 kg)   GEN: No acute distress HEENT:PERRL, sclerae not icteric. Oral mucosa moist without lesions, posterior pharynx clear.  Neck supple. No JVD.  Lymphatics:no cervical,supraclavicular or inguinal adenopathy Resp: clear to auscultation  bilaterally Cardio: regular rate and rhythm. No gallop. GI: abdomen obese, soft, nontender to gentle palpation, not obviously distended, no appreciable mass or organomegaly. Healed surgical incision. Musculoskeletal/ Extremities: no overt pitting pedal edema. Neuro: alert and oriented 4 no focal motor neurological deficits PSYCH appropriate mood and affect Skin no acute rashes  Lab Results:  Results for orders placed or performed in visit on 04/18/15  CBC with Differential  Result Value Ref Range   WBC 4.8 3.9 - 10.3 10e3/uL   NEUT# 3.3 1.5 - 6.5 10e3/uL   HGB 11.7 11.6 - 15.9 g/dL   HCT 36.7 34.8 - 46.6 %   Platelets 257 145 - 400 10e3/uL   MCV 88.4 79.5 - 101.0 fL   MCH 28.2 25.1 - 34.0 pg   MCHC 31.9 31.5 - 36.0 g/dL   RBC 4.15 3.70 - 5.45 10e6/uL   RDW 15.4 (H) 11.2 - 14.5 %   lymph# 1.3 0.9 - 3.3 10e3/uL   MONO# 0.0 (L) 0.1 - 0.9 10e3/uL   Eosinophils Absolute 0.2 0.0 - 0.5 10e3/uL   Basophils Absolute 0.0 0.0 - 0.1 10e3/uL   NEUT% 68.1 38.4 - 76.8 %   LYMPH% 26.8 14.0 - 49.7 %   MONO% 0.8 0.0 - 14.0 %   EOS% 4.1 0.0 - 7.0 %   BASO% 0.2 0.0 - 2.0 %  Comprehensive metabolic panel  Result Value Ref Range   Sodium 136 136 - 145 mEq/L   Potassium 4.8 3.5 - 5.1 mEq/L   Chloride 99 98 - 109 mEq/L   CO2 26 22 - 29 mEq/L   Glucose 143 (H) 70 - 140 mg/dl   BUN 19.5 7.0 - 26.0 mg/dL   Creatinine 1.3 (H) 0.6 - 1.1 mg/dL   Total Bilirubin 0.47 0.20 - 1.20 mg/dL   Alkaline Phosphatase 63 40 - 150 U/L   AST 15 5 - 34 U/L   ALT 11 0 - 55 U/L   Total Protein 7.9 6.4 - 8.3 g/dL   Albumin 3.9 3.5 - 5.0 g/dL   Calcium 10.7 (H) 8.4 - 10.4 mg/dL   Anion Gap 11 3 - 11 mEq/L   EGFR 54 (L) >90 ml/min/1.73 m2     Assessment/Plan:  1.IIB carcinosarcoma of right ovary with involvement of left ovary and uterine serosa: s/p radical debulking with TAH BSO and portion of small bowel, by Dr Denman George on 03-13-15.  Patient had mild wound infection which was treated with Keflex and has  resolved. Received her first cycle of carbotaxol on 04/11/2015. Counts stable at this time but have likely not nadir yet. Did not receive G-CSF with this cycle. 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  Plan -continue follow-up with Dr. Marko Plume as per her appointment on 04/23/2015. -planning for 6 cycles of q 3 week taxol carboplatin beginning   2.synchronous IA grade 1 endometrioid adenocarcinoma of uterus, treated surgically  3 Wound seroma vs cellulitis -appears to have resolved with no overt open areas or discharge at this time. Patient was treated with Keflex.  4.morbid obesity,  5. Dysuria - patient called on Friday to complain of some mild dysuria. No fevers or chills.  Recently completed Keflex for her abdominal wall surgical skin infection versus seroma. Unable to come in for urinalysis and urine culture. Interval my nurse to give her a prescription for levofloxacin 250 mg by mouth daily for 3 days which she should start only for dysuria persisted despite adequate water intake. -ideally should get a urinalysis and urine culture with Dr. Marko Plume on day and start antibiotics only if insistent with urinary tract infection. -Has had previous pyelonephritis in November 2016 in the setting of urinary tract obstruction. -Encouraged good oral fluid intake.  Continue follow-up with Dr. Marko Plume as per appointment on 04/23/2015.   Sullivan Lone, MD   04/18/2015, 10:37 AM

## 2015-04-18 NOTE — Progress Notes (Signed)
Pt came in to office to bring applications and documents for assistance. Faxed [employer] application. Fax received ok per confirmation sheet.

## 2015-04-19 ENCOUNTER — Other Ambulatory Visit: Payer: Self-pay | Admitting: *Deleted

## 2015-04-19 ENCOUNTER — Telehealth: Payer: Self-pay | Admitting: *Deleted

## 2015-04-19 DIAGNOSIS — C569 Malignant neoplasm of unspecified ovary: Secondary | ICD-10-CM

## 2015-04-19 MED ORDER — SENNA 8.6 MG PO TABS: 1.0000 | ORAL_TABLET | Freq: Two times a day (BID) | ORAL | Status: DC

## 2015-04-19 MED ORDER — SENNA 8.6 MG PO TABS
1.0000 | ORAL_TABLET | Freq: Two times a day (BID) | ORAL | Status: DC
Start: 2015-04-19 — End: 2015-04-23

## 2015-04-19 NOTE — Telephone Encounter (Signed)
Call from [facility] reporting "last BM was Monday with a lot of straining.  Taking [name] daily since Monday and stool softeners.  Passing a little gas.  Drinking four 16 oz bottled water daily.  Abdominal pain/cramping.  Denies swelling, bloating, firmness or tenderness to abdomen.  Not eating much."  Takes pain medicine s/p surgery 03-23-2015.  Advised to increase water intake.  Try apple juice and hot tea.  Suggested dulcolax but she does not have any other laxatives on hand. At this time.  Return number [phone].

## 2015-04-19 NOTE — Telephone Encounter (Signed)
Prescription sent for senna 8.6 mg to [facility]. Patient informed and verbalized understanding.

## 2015-04-20 ENCOUNTER — Other Ambulatory Visit: Payer: Self-pay | Admitting: Oncology

## 2015-04-20 ENCOUNTER — Telehealth: Payer: Self-pay

## 2015-04-20 NOTE — Telephone Encounter (Signed)
-----   Message from Gordy Levan, MD sent at 04/20/2015  3:59 PM EST ----- Phone notes from 1-12 seen. If bowels have not moved well today, have her increase miralax to bid in addition to the senna

## 2015-04-20 NOTE — Telephone Encounter (Signed)
Left message in patient's voice mail regarding constipation and needing to increase fluid intake as noted below by Dr. Marko Plume. Pt with a follow up appointment on 04-23-15 with Dr. Marko Plume.

## 2015-04-20 NOTE — Telephone Encounter (Signed)
Patient Demographics     Patient Name Sex DOB SSN Address Phone    [name], [name] Female 28-Jun-1965 [id number] [address] [place] [address] [phone] [facility]) [phone] (Mobile)       Message  Received: Today    Gordy Levan, MD  Ronnette Juniper, RN; Cherylynn Ridges, RN; Baruch Merl, RN           See previous message re laxatives   She also needs to PUSH PO FLUIDS - weight down and creatinine a little higher 1-11   Thank you

## 2015-04-23 ENCOUNTER — Ambulatory Visit (HOSPITAL_BASED_OUTPATIENT_CLINIC_OR_DEPARTMENT_OTHER): Payer: BLUE CROSS/BLUE SHIELD | Admitting: Oncology

## 2015-04-23 ENCOUNTER — Encounter: Payer: Self-pay | Admitting: Oncology

## 2015-04-23 ENCOUNTER — Other Ambulatory Visit (HOSPITAL_BASED_OUTPATIENT_CLINIC_OR_DEPARTMENT_OTHER): Payer: BLUE CROSS/BLUE SHIELD

## 2015-04-23 ENCOUNTER — Telehealth: Payer: Self-pay | Admitting: Oncology

## 2015-04-23 VITALS — BP 142/89 | HR 92 | Temp 97.8°F | Resp 18 | Ht 65.0 in | Wt 348.9 lb

## 2015-04-23 DIAGNOSIS — C561 Malignant neoplasm of right ovary: Secondary | ICD-10-CM

## 2015-04-23 DIAGNOSIS — D709 Neutropenia, unspecified: Secondary | ICD-10-CM

## 2015-04-23 DIAGNOSIS — C562 Malignant neoplasm of left ovary: Secondary | ICD-10-CM

## 2015-04-23 DIAGNOSIS — D509 Iron deficiency anemia, unspecified: Secondary | ICD-10-CM

## 2015-04-23 DIAGNOSIS — R350 Frequency of micturition: Secondary | ICD-10-CM

## 2015-04-23 DIAGNOSIS — N390 Urinary tract infection, site not specified: Secondary | ICD-10-CM

## 2015-04-23 DIAGNOSIS — C541 Malignant neoplasm of endometrium: Secondary | ICD-10-CM

## 2015-04-23 LAB — CBC WITH DIFFERENTIAL/PLATELET
BASO%: 1.1 % (ref 0.0–2.0)
BASOS ABS: 0 10*3/uL (ref 0.0–0.1)
EOS%: 7 % (ref 0.0–7.0)
Eosinophils Absolute: 0.2 10*3/uL (ref 0.0–0.5)
HEMATOCRIT: 34.3 % — AB (ref 34.8–46.6)
HEMOGLOBIN: 11.1 g/dL — AB (ref 11.6–15.9)
LYMPH#: 1.6 10*3/uL (ref 0.9–3.3)
LYMPH%: 59.9 % — ABNORMAL HIGH (ref 14.0–49.7)
MCH: 27.8 pg (ref 25.1–34.0)
MCHC: 32.4 g/dL (ref 31.5–36.0)
MCV: 85.6 fL (ref 79.5–101.0)
MONO#: 0.7 10*3/uL (ref 0.1–0.9)
MONO%: 27.6 % — ABNORMAL HIGH (ref 0.0–14.0)
NEUT%: 4.4 % — ABNORMAL LOW (ref 38.4–76.8)
NEUTROS ABS: 0.1 10*3/uL — AB (ref 1.5–6.5)
Platelets: 222 10*3/uL (ref 145–400)
RBC: 4.01 10*6/uL (ref 3.70–5.45)
RDW: 16.5 % — AB (ref 11.2–14.5)
WBC: 2.7 10*3/uL — AB (ref 3.9–10.3)

## 2015-04-23 LAB — URINALYSIS, MICROSCOPIC - CHCC
BLOOD: NEGATIVE
Glucose: NEGATIVE mg/dL
KETONES: NEGATIVE mg/dL
SPECIFIC GRAVITY, URINE: 1.015 (ref 1.003–1.035)
pH: 5 (ref 4.6–8.0)

## 2015-04-23 LAB — COMPREHENSIVE METABOLIC PANEL
ALBUMIN: 3.6 g/dL (ref 3.5–5.0)
ALK PHOS: 75 U/L (ref 40–150)
ALT: 12 U/L (ref 0–55)
AST: 13 U/L (ref 5–34)
Anion Gap: 11 mEq/L (ref 3–11)
BUN: 16.1 mg/dL (ref 7.0–26.0)
CALCIUM: 9.9 mg/dL (ref 8.4–10.4)
CO2: 23 mEq/L (ref 22–29)
CREATININE: 1.3 mg/dL — AB (ref 0.6–1.1)
Chloride: 103 mEq/L (ref 98–109)
EGFR: 56 mL/min/{1.73_m2} — ABNORMAL LOW (ref >90)
GLUCOSE: 112 mg/dL (ref 70–140)
Potassium: 4.5 mEq/L (ref 3.5–5.1)
Sodium: 137 mEq/L (ref 136–145)
TOTAL PROTEIN: 7.6 g/dL (ref 6.4–8.3)
Total Bilirubin: <0.3 mg/dL (ref 0.20–1.20)

## 2015-04-23 MED ORDER — CIPROFLOXACIN HCL 500 MG PO TABS: 500.0000 mg | ORAL_TABLET | Freq: Two times a day (BID) | ORAL | Status: DC

## 2015-04-23 MED ORDER — TBO-FILGRASTIM 480 MCG/0.8ML ~~LOC~~ SOSY
480.0000 ug | PREFILLED_SYRINGE | Freq: Once | SUBCUTANEOUS | Status: AC
  Administered 2015-04-23: 480 ug via SUBCUTANEOUS
  Filled 2015-04-23: qty 0.8

## 2015-04-23 MED FILL — CIPROFLOXACIN HCL 500 MG TA: 500 | 7 days supply | Qty: 14 | Fill #0

## 2015-04-23 NOTE — Telephone Encounter (Signed)
Spoke with patient re appointments for 1/17 and 1/18. Other appointments remain the same. No other orders per 1/16 pof.

## 2015-04-23 NOTE — Progress Notes (Signed)
OFFICE PROGRESS NOTE   April 23, 2015   Physicians:Emma Rexford Maus, Kayleen Memos, MD (PCP),   INTERVAL HISTORY:  Patient is seen, alone for visit, in follow up of adjuvant chemotherapy begun 04-12-15 for IIB carcinosarcoma of right ovary. Counts were still good when she saw my partner on day 7, however she is neutropenic today (day 12 cycle 1 with ANC 0.1) and will begin granix now.  Patient is not febrile, but has urinary frequency and burning which began night of 04-20-15; she has used AZO with some improvement. She has no other symptoms of infection, no chills, and is not markedly fatigued. Otherwise, she tolerated premedication steroids for chemo without problems, and infusion was uncomplicated, the new PAC functioning well. She has had no nausea and has needed no prn antiemetics. She had taxol aches beginning day 3 and lasting x 3 days "all over"; she began claritin after aches had begun and will start this day of chemo for subsequent cycles. 1/2 of oxycodone 5 mg tablet did help a little with pain, may need to increase to full tablet for subsequent cycles, understands that the pain medication does increase constipation. Heating pad and hot shower also were helpful for aches. Aches have resolved entirely now. She had constipation after chemo, no BM x 3 days and was uncomfortable from this, bowels moved after 2 doses each miralax and colace; she continues miralax and colace once daily now, bowels moving well with this. She understands that she should increase bowel regimen after next chemo as needed to keep bowels moving daily. She has been able to eat tho taste not correct, has been drinking lots of fluids. She slept some last pm, up to void x 3. No problems with abdominal wound since treated for cellulitis at ED visit prior to start of chemo.    PAC by IR 04-11-15 Flu vaccine 12-12-14  Review of systems as above, also: No SOB or other respiratory symptoms. No bleeding. No increased LE swelling.  PAC a little sore from placement, moreso at tubing at clavicle, improving. No peripheral neuropathy Remainder of 10 point Review of Systems negative.     ONCOLOGIC HISTORY Patient had intermittent right lower quadrant abdominal pain since Aug 2016, with pelvic US 11-30-14 with uterine fibroids (11.2 x 4.8 x 6.8 cm. 1.7 x 1.7 x 2.0 cm subserosal fibroid in the right uterine body. 6.3 x 3.6 x 5.7 cm probable exophytic/pedunculated fibroid along the anterior uterine fundus), endometrium poorly visualized, right ovary poorly visualized but measured 4 x 2.8 x 4.4 cm and left ovary 4.9 x 3.4 x 4.9 cm with probable corpus luteal cyst called. Note patient is morbidly obese, with BMI >60. She had some fevers during that time. Patient was admitted to [facility] 02-18-15 thru 02-21-15 with pyelonephritis and acute renal failure (creatinine 12.4), both of those problems resolving entirely with treatment. CT AP and MRI pelvis done to evaluate the pyelonephritis demonstrated cystic and solid pelvic mass appearing to be arising from the right adnexa 13 x 9 x 17.5 cm, with a second cystic and solid lesion within the left adnexa measuring 4.4 x 6.5 x 5.3 cm. A third cystic and solid mass was seen anterior to the uterus and superior to the bladder measuring 4.2 x 5.4 x 5.3 cm. She had no pelvic lymphadenopathy, no carcinomatosis, and CA 125 was normal at 21 U/mL. She was seen in consultation by Dr Denman George, with exploratory laparotomy 03-23-15 with TAH BSO, partial small bowel resection and radical debulking, with  additional ventral hernia repair. She was hospitalized with the surgery from 12-6 thru 03-16-15, discharged on prophylactic lovenox which she continues, no post op complications in hospital. Jodell Cipro were removed on 03-27-15, some separation of wound with wound care continuing by patient at home. Surgical pathology [id number]) was reviewed at [facility], with final diagnosis of IIB carcinosarcoma of right ovary  with involvement of left ovary and uterine serosa, and IA grade 1 endometrioid adenocarcinoma of uterus. She saw Dr Denman George 03-29-15, with recommendation for adjuvant chemotherapy and follow up with gyn oncology expected after 6 cycles of chemotherapy. Cycle 1 carbo taxol given 04-12-15, carbo capped at 750 mg and taxol dosed at 175 mg/m2 using actual weight. She was neutropenic with ANCX 0.1 on day 12 cycle 1.     Objective:  Vital signs in last 24 hours:  BP 142/89 mmHg  Pulse 92  Temp(Src) 97.8 F (36.6 C) (Oral)  Resp 18  Ht '5\' 5"'  (1.651 m)  Wt 348 lb 14.4 oz (158.26 kg)  BMI 58.06 kg/m2  SpO2 100%  LMP 10/16/2013 (Approximate) Weight stable Alert, oriented and appropriate. Ambulatory without assistance.  No alopecia  HEENT:PERRL, sclerae not icteric. Oral mucosa moist without lesions, posterior pharynx clear.  Neck supple. No JVD.  Lymphatics:no cervical,suraclavicular adenopathy Resp: clear to auscultation bilaterally and normal percussion bilaterally, diminished BS in bases consistent with habitus Cardio: regular rate and rhythm. No gallop. GI: abdomen obese, soft, nontender, not distended, no appreciable mass or organomegaly. Normally active bowel sounds. Surgical incision now closed with exception of <1 cm area not quite reepitheliazed., no tenderness, no erythema, no firmness surrounding. Musculoskeletal/ Extremities: without pitting edema, cords, tenderness Neuro: no peripheral neuropathy. Otherwise nonfocal . PSYCH appropriate mood and affect Skin without rash, ecchymosis, petechiae Portacath-without erythema or unusual tenderness, minimal resolving ecchymoses from placement. Steristrips off, medial 1.5 cm of incision above access not quite healed. Area of tubing at clavicle palpable, no findings of concern  Lab Results:  Results for orders placed or performed in visit on 04/23/15  CBC with Differential  Result Value Ref Range   WBC 2.7 (L) 3.9 - 10.3 10e3/uL   NEUT#  0.1 (LL) 1.5 - 6.5 10e3/uL   HGB 11.1 (L) 11.6 - 15.9 g/dL   HCT 34.3 (L) 34.8 - 46.6 %   Platelets 222 145 - 400 10e3/uL   MCV 85.6 79.5 - 101.0 fL   MCH 27.8 25.1 - 34.0 pg   MCHC 32.4 31.5 - 36.0 g/dL   RBC 4.01 3.70 - 5.45 10e6/uL   RDW 16.5 (H) 11.2 - 14.5 %   lymph# 1.6 0.9 - 3.3 10e3/uL   MONO# 0.7 0.1 - 0.9 10e3/uL   Eosinophils Absolute 0.2 0.0 - 0.5 10e3/uL   Basophils Absolute 0.0 0.0 - 0.1 10e3/uL   NEUT% 4.4 (L) 38.4 - 76.8 %   LYMPH% 59.9 (H) 14.0 - 49.7 %   MONO% 27.6 (H) 0.0 - 14.0 %   EOS% 7.0 0.0 - 7.0 %   BASO% 1.1 0.0 - 2.0 %  Comprehensive metabolic panel  Result Value Ref Range   Sodium 137 136 - 145 mEq/L   Potassium 4.5 3.5 - 5.1 mEq/L   Chloride 103 98 - 109 mEq/L   CO2 23 22 - 29 mEq/L   Glucose 112 70 - 140 mg/dl   BUN 16.1 7.0 - 26.0 mg/dL   Creatinine 1.3 (H) 0.6 - 1.1 mg/dL   Total Bilirubin <0.30 0.20 - 1.20 mg/dL   Alkaline Phosphatase 75  40 - 150 U/L   AST 13 5 - 34 U/L   ALT 12 0 - 55 U/L   Total Protein 7.6 6.4 - 8.3 g/dL   Albumin 3.6 3.5 - 5.0 g/dL   Calcium 9.9 8.4 - 10.4 mg/dL   Anion Gap 11 3 - 11 mEq/L   EGFR 56 (L) >90 ml/min/1.73 m2  Urinalysis with microscopic - [facility]  Result Value Ref Range   Glucose Negative Negative mg/dL   Bilirubin (Urine) Color Interference Negative   Ketones Negative Negative mg/dL   Specific Gravity, Urine 1.015 1.003 - 1.035   Blood Negative Negative   pH 5.0 4.6 - 8.0   Protein Color Interference Negative- <30 mg/dL   Urobilinogen, UR Color Interference 0.2 - 1 mg/dL   Nitrite Color Interference Negative   Leukocyte Esterase Color Interference Negative   RBC / HPF 3-6 0 - 2   WBC, UA 3-6 0 - 2   Bacteria, UA Few Negative- Trace   Casts Hyaline Negative   Epithelial Cells Moderate Negative- Few   urine culture sent and pending. Color interference on UA from AZO  Studies/Results:  No results found.  Medications: I have reviewed the patient's current medications. Discussed granix including  rationale, mech of action, administration; begin today 480 mg daily until ANC > 1.0 Begin Cipro empirically for UTI symptoms, awaiting culture.   Begin claritin day of chemo subsequent cycles, use until taxol aches (or gCSF aches) have resolved each cycle. Continue daily miralax/ colace and increase as needed after chemo OK to use oxycodone 1/2 - 1 of 5 mg tablets that she has at home prn for taxol (or gCSF) aches, tho recommended using these sparingly due to constipation  DISCUSSION All of interval history reviewed as above. Neutropenia discussed, including neutropenic precautions. She has thermometer at home. Will give granix today and 1-17, will recheck CBC on 1-18 and continue if ANC not > 1.0.   Meds as above  I will see her at least 04-30-15 and she will be due cycle 2 on 05-03-15. WIll need to begin gCSF either as granix daily or neulasta no later than ~day 6 of subsequent cycles. Choice of granix vs neulasta to be determined; preauth is done for granix at this point.  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 Pathology reviewed at [facility]. Plan 6 cycles of q 3 week taxol carboplatin beginning 04-12-15; I appreciate Dr Irene Limbo seeing her in my absence on 04-18-15.  Neutropenic from chemotherapy today, begin gCSF , repeat CBC 1-18 with additional doses pending those counts, other plan as above. No increase carbo for cycle 2 due 05-03-15, see above. 2.synchronous IA grade 1 endometrioid adenocarcinoma of uterus, treated surgically 3 Wound seroma vs cellulitis: clinically resolved 4.morbid obesity, BMI 62. Cycle 1 carbo capped at 750 mg per [facility] protocol; with renal function and ANC 0.1 this first cycle, will not increase dose for cycle 2 on 05-03-15.. Will ask [place] nutritionist to see.  5.poor peripheral venous access: power PAC by IR for  chemo 04-11-15 6.pyelonephritis 02-2015, resolved. Urinary tract symptoms today, cx pending and cipro begun. 7.acute renal failure with creatinine 12.4 at presentation with pyelonephritis, resolved. Creatinine essentially stable at 1.3 by labs today. Encouraged good hydration and will follow renal function closely with carboplatin. Dosing carbo as noted 8.overdue mammograms, which we will address when  possible. 9.history of "migraine" HAs: no problem with zofran since first chemo 10.perforated diverticular disease with temporary colostomy 2008. See ? Colonoscopy above 11.flu vaccine 12-12-14 12 no DVT by LE dopplers after 03-29-15. Completed post op prophylactic lovenox .LE swelling improved.. [email or web address] iron deficiency anemia by labs available after visit: tolerating oral ferrous fumarate, continue. f/u colonoscopy information 14.history of PCN allergy, which was not an anaphylactic reaction. Agree with plan to try oral cephalosporin now 15.financial concerns: appreciate assistance from [facility] financial staff 16.PAC in 17. Urinary frequency: with neutropenia, recent urinary tract infection and these symptoms have begun empiric cipro awaiting culture results  Granix orders placed with parameters, already preauthorized. Time spent 40 min including >50% counseling and coordination of care.   LIVESAY,LENNIS P, MD   04/23/2015, 1:34 PM

## 2015-04-24 ENCOUNTER — Ambulatory Visit (HOSPITAL_BASED_OUTPATIENT_CLINIC_OR_DEPARTMENT_OTHER): Payer: BLUE CROSS/BLUE SHIELD

## 2015-04-24 VITALS — BP 143/89 | HR 102 | Temp 98.4°F

## 2015-04-24 DIAGNOSIS — C561 Malignant neoplasm of right ovary: Secondary | ICD-10-CM | POA: Diagnosis not present

## 2015-04-24 DIAGNOSIS — Z5189 Encounter for other specified aftercare: Secondary | ICD-10-CM | POA: Diagnosis not present

## 2015-04-24 MED ORDER — TBO-FILGRASTIM 480 MCG/0.8ML ~~LOC~~ SOSY
480.0000 ug | PREFILLED_SYRINGE | Freq: Once | SUBCUTANEOUS | Status: AC
  Administered 2015-04-24: 480 ug via SUBCUTANEOUS
  Filled 2015-04-24: qty 0.8

## 2015-04-25 ENCOUNTER — Telehealth: Payer: Self-pay

## 2015-04-25 ENCOUNTER — Ambulatory Visit: Payer: BLUE CROSS/BLUE SHIELD

## 2015-04-25 ENCOUNTER — Other Ambulatory Visit (HOSPITAL_BASED_OUTPATIENT_CLINIC_OR_DEPARTMENT_OTHER): Payer: BLUE CROSS/BLUE SHIELD

## 2015-04-25 DIAGNOSIS — C561 Malignant neoplasm of right ovary: Secondary | ICD-10-CM

## 2015-04-25 LAB — CBC WITH DIFFERENTIAL/PLATELET
BASO%: 0.4 % (ref 0.0–2.0)
BASOS ABS: 0.1 10*3/uL (ref 0.0–0.1)
EOS ABS: 0.6 10*3/uL — AB (ref 0.0–0.5)
EOS%: 1.8 % (ref 0.0–7.0)
HCT: 35.8 % (ref 34.8–46.6)
HGB: 11.4 g/dL — ABNORMAL LOW (ref 11.6–15.9)
LYMPH%: 11.1 % — AB (ref 14.0–49.7)
MCH: 27.8 pg (ref 25.1–34.0)
MCHC: 31.9 g/dL (ref 31.5–36.0)
MCV: 87 fL (ref 79.5–101.0)
MONO#: 3 10*3/uL — ABNORMAL HIGH (ref 0.1–0.9)
MONO%: 9.6 % (ref 0.0–14.0)
NEUT#: 24.4 10*3/uL — ABNORMAL HIGH (ref 1.5–6.5)
NEUT%: 77.1 % — AB (ref 38.4–76.8)
PLATELETS: 170 10*3/uL (ref 145–400)
RBC: 4.11 10*6/uL (ref 3.70–5.45)
RDW: 16.5 % — ABNORMAL HIGH (ref 11.2–14.5)
WBC: 31.7 10*3/uL — AB (ref 3.9–10.3)
lymph#: 3.5 10*3/uL — ABNORMAL HIGH (ref 0.9–3.3)
nRBC: 0 % (ref 0–0)

## 2015-04-25 LAB — URINE CULTURE

## 2015-04-25 NOTE — Telephone Encounter (Signed)
-----   Message from Gordy Levan, MD sent at 04/25/2015 10:03 AM EST ----- Urine cx negative  Please let her know when RN speaks with her today re CBC. Lab and injection 03-1229 today  If still neutropenic would continue cipro If not neutropenic would stop cipro If still bladder symptoms page me - could also be yeast with recent steroids  thanks

## 2015-04-25 NOTE — Telephone Encounter (Signed)
Told [name] that the urine culture was negative as noted below by Dr. Marko Plume. She denies having  any symptoms urinary symptoms. She will stop the Cipro as her ANC is 24.4 and is not neutropenic per Dr. Mariana Kaufman parameters noted below.

## 2015-04-25 NOTE — Progress Notes (Signed)
Angell here for possible Granix injection.  She had cbc drawn.  ANC up to 24.4, HGB 11.4and PLTC 170.  Per Dr Marko Plume no Granix today.

## 2015-04-29 ENCOUNTER — Other Ambulatory Visit: Payer: Self-pay | Admitting: Oncology

## 2015-04-30 ENCOUNTER — Ambulatory Visit (HOSPITAL_BASED_OUTPATIENT_CLINIC_OR_DEPARTMENT_OTHER): Payer: BLUE CROSS/BLUE SHIELD | Admitting: Oncology

## 2015-04-30 ENCOUNTER — Encounter: Payer: Self-pay | Admitting: Oncology

## 2015-04-30 ENCOUNTER — Other Ambulatory Visit (HOSPITAL_BASED_OUTPATIENT_CLINIC_OR_DEPARTMENT_OTHER): Payer: BLUE CROSS/BLUE SHIELD

## 2015-04-30 ENCOUNTER — Telehealth: Payer: Self-pay | Admitting: Oncology

## 2015-04-30 VITALS — BP 144/97 | HR 87 | Temp 98.0°F | Resp 18 | Ht 65.0 in | Wt 357.6 lb

## 2015-04-30 DIAGNOSIS — D701 Agranulocytosis secondary to cancer chemotherapy: Secondary | ICD-10-CM | POA: Diagnosis not present

## 2015-04-30 DIAGNOSIS — C562 Malignant neoplasm of left ovary: Secondary | ICD-10-CM

## 2015-04-30 DIAGNOSIS — C561 Malignant neoplasm of right ovary: Secondary | ICD-10-CM

## 2015-04-30 DIAGNOSIS — C541 Malignant neoplasm of endometrium: Secondary | ICD-10-CM | POA: Diagnosis not present

## 2015-04-30 DIAGNOSIS — T451X5A Adverse effect of antineoplastic and immunosuppressive drugs, initial encounter: Secondary | ICD-10-CM

## 2015-04-30 DIAGNOSIS — D509 Iron deficiency anemia, unspecified: Secondary | ICD-10-CM

## 2015-04-30 DIAGNOSIS — Z95828 Presence of other vascular implants and grafts: Secondary | ICD-10-CM

## 2015-04-30 DIAGNOSIS — Z6841 Body Mass Index (BMI) 40.0 and over, adult: Secondary | ICD-10-CM

## 2015-04-30 LAB — COMPREHENSIVE METABOLIC PANEL
ALT: 9 U/L (ref 0–55)
ANION GAP: 10 meq/L (ref 3–11)
AST: 11 U/L (ref 5–34)
Albumin: 3.4 g/dL — ABNORMAL LOW (ref 3.5–5.0)
Alkaline Phosphatase: 80 U/L (ref 40–150)
BUN: 21.4 mg/dL (ref 7.0–26.0)
CALCIUM: 9.6 mg/dL (ref 8.4–10.4)
CO2: 25 meq/L (ref 22–29)
CREATININE: 1.3 mg/dL — AB (ref 0.6–1.1)
Chloride: 104 mEq/L (ref 98–109)
EGFR: 55 mL/min/{1.73_m2} — ABNORMAL LOW (ref >90)
Glucose: 105 mg/dl (ref 70–140)
Potassium: 4.5 mEq/L (ref 3.5–5.1)
Sodium: 139 mEq/L (ref 136–145)
TOTAL PROTEIN: 7.1 g/dL (ref 6.4–8.3)

## 2015-04-30 LAB — CBC WITH DIFFERENTIAL/PLATELET
BASO%: 0.9 % (ref 0.0–2.0)
Basophils Absolute: 0.1 10*3/uL (ref 0.0–0.1)
EOS%: 2.3 % (ref 0.0–7.0)
Eosinophils Absolute: 0.2 10*3/uL (ref 0.0–0.5)
HEMATOCRIT: 33.4 % — AB (ref 34.8–46.6)
HGB: 10.9 g/dL — ABNORMAL LOW (ref 11.6–15.9)
LYMPH#: 2.6 10*3/uL (ref 0.9–3.3)
LYMPH%: 31.1 % (ref 14.0–49.7)
MCH: 27.9 pg (ref 25.1–34.0)
MCHC: 32.6 g/dL (ref 31.5–36.0)
MCV: 85.6 fL (ref 79.5–101.0)
MONO#: 0.9 10*3/uL (ref 0.1–0.9)
MONO%: 10.5 % (ref 0.0–14.0)
NEUT%: 55.2 % (ref 38.4–76.8)
NEUTROS ABS: 4.6 10*3/uL (ref 1.5–6.5)
PLATELETS: 145 10*3/uL (ref 145–400)
RBC: 3.9 10*6/uL (ref 3.70–5.45)
RDW: 16.3 % — ABNORMAL HIGH (ref 11.2–14.5)
WBC: 8.4 10*3/uL (ref 3.9–10.3)

## 2015-04-30 MED ORDER — DEXAMETHASONE 4 MG PO TABS: ORAL_TABLET | ORAL | Status: DC

## 2015-04-30 MED FILL — DEXAMETHASONE 4 MG TABLET: 4 | 2 days supply | Qty: 20 | Fill #0

## 2015-04-30 NOTE — Telephone Encounter (Signed)
Appointments made and avs printed °

## 2015-04-30 NOTE — Progress Notes (Signed)
OFFICE PROGRESS NOTE   April 30, 2015   Physicians: Everitt Amber, Alveda Reasons, MD (PCP),   INTERVAL HISTORY:  Patient is seen, alone for visit, in continuing attention to adjuvant chemotherapy begun 04-12-15 for IIB carcinosarcoma of right ovary. She was neutropenic by day 12 cycle 1, with ANC 0.1, but had prompt recovery of counts with granix x 2 then. Carboplatin was capped at 750 mg for cycle 1; taxol was dosed using actual weight. She is due cycle 2 on 05-03-15, and will have earlier gCSF support.   Patient had some aches in legs after granix, tho this was not as severe as taxol aches (~ days 3-6). She has felt generally well past several days. Aches have resolved. She has had no nausea. Bowels are moving well daily with once daily miralax and colace, which she will increase beginning day of next chemo. She has more discomfort right knee, where she had steroid injection by PCP July 2016 with improvement. She denies bleeding, abdominal or pelvic pain, increased SOB, fever or symptoms of infection. No problems with PAC. No other orthopedic symptoms new. Tells me that hair loss is not bothering her tho scalp is sensitive. Remainder of 10 point Review of Systems negative.    PAC by IR 04-11-15 Flu vaccine 12-12-14  ONCOLOGIC HISTORY Patient had intermittent right lower quadrant abdominal pain since Aug 2016, with pelvic US 11-30-14 with uterine fibroids (11.2 x 4.8 x 6.8 cm. 1.7 x 1.7 x 2.0 cm subserosal fibroid in the right uterine body. 6.3 x 3.6 x 5.7 cm probable exophytic/pedunculated fibroid along the anterior uterine fundus), endometrium poorly visualized, right ovary poorly visualized but measured 4 x 2.8 x 4.4 cm and left ovary 4.9 x 3.4 x 4.9 cm with probable corpus luteal cyst called. Note patient is morbidly obese, with BMI >60. She had some fevers during that time. Patient was admitted to [facility] 02-18-15 thru 02-21-15 with pyelonephritis and acute renal failure (creatinine 12.4), both of  those problems resolving entirely with treatment. CT AP and MRI pelvis done to evaluate the pyelonephritis demonstrated cystic and solid pelvic mass appearing to be arising from the right adnexa 13 x 9 x 17.5 cm, with a second cystic and solid lesion within the left adnexa measuring 4.4 x 6.5 x 5.3 cm. A third cystic and solid mass was seen anterior to the uterus and superior to the bladder measuring 4.2 x 5.4 x 5.3 cm. She had no pelvic lymphadenopathy, no carcinomatosis, and CA 125 was normal at 21 U/mL. She was seen in consultation by Dr Denman George, with exploratory laparotomy 03-23-15 with TAH BSO, partial small bowel resection and radical debulking, with additional ventral hernia repair. She was hospitalized with the surgery from 12-6 thru 03-16-15, discharged on prophylactic lovenox which she continues, no post op complications in hospital. Jodell Cipro were removed on 03-27-15, some separation of wound with wound care continuing by patient at home. Surgical pathology [id number]) was reviewed at [facility], with final diagnosis of IIB carcinosarcoma of right ovary with involvement of left ovary and uterine serosa, and IA grade 1 endometrioid adenocarcinoma of uterus. She saw Dr Denman George 03-29-15, with recommendation for adjuvant chemotherapy and follow up with gyn oncology expected after 6 cycles of chemotherapy. Cycle 1 carbo taxol given 04-12-15, carbo capped at 750 mg and taxol dosed at 175 mg/m2 using actual weight. She was neutropenic with ANCX 0.1 on day 12 cycle 1.    Objective:  Vital signs in last 24 hours:  BP 144/97  mmHg  Pulse 87  Temp(Src) 98 F (36.7 C) (Oral)  Resp 18  Ht '5\' 5"'  (1.651 m)  Wt 357 lb 9.6 oz (162.206 kg)  BMI 59.51 kg/m2  SpO2 100%  LMP 10/16/2013 (Approximate) Weight stable Alert, oriented and appropriate. Ambulatory using cane due to left knee, needs assistance on and off exam table. Partial alopecia  HEENT:PERRL, sclerae not icteric. Oral mucosa moist without  lesions, posterior pharynx clear.  Neck supple. No JVD.  Lymphatics:no cervical,supraclavicular adenopathy Resp: diminished BS in bases due to habitus otherwise clear to auscultation bilaterally and normal percussion bilaterally Cardio: regular rate and rhythm. No gallop. GI: abdomen obese, soft, nontender, not distended, cannot appreciate mass or organomegaly. Normally active bowel sounds. Surgical incision closed,  not remarkable. Musculoskeletal/ Extremities: LE without pitting edema, cords, tenderness. No erythema or clear effusion left knee Neuro: no peripheral neuropathy. Otherwise nonfocal. PSYCH appropriate mood and affect Skin without rash, ecchymosis, petechiae Portacath-without erythema or tenderness  Lab Results:  Results for orders placed or performed in visit on 04/30/15  CBC with Differential  Result Value Ref Range   WBC 8.4 3.9 - 10.3 10e3/uL   NEUT# 4.6 1.5 - 6.5 10e3/uL   HGB 10.9 (L) 11.6 - 15.9 g/dL   HCT 33.4 (L) 34.8 - 46.6 %   Platelets 145 145 - 400 10e3/uL   MCV 85.6 79.5 - 101.0 fL   MCH 27.9 25.1 - 34.0 pg   MCHC 32.6 31.5 - 36.0 g/dL   RBC 3.90 3.70 - 5.45 10e6/uL   RDW 16.3 (H) 11.2 - 14.5 %   lymph# 2.6 0.9 - 3.3 10e3/uL   MONO# 0.9 0.1 - 0.9 10e3/uL   Eosinophils Absolute 0.2 0.0 - 0.5 10e3/uL   Basophils Absolute 0.1 0.0 - 0.1 10e3/uL   NEUT% 55.2 38.4 - 76.8 %   LYMPH% 31.1 14.0 - 49.7 %   MONO% 10.5 0.0 - 14.0 %   EOS% 2.3 0.0 - 7.0 %   BASO% 0.9 0.0 - 2.0 %  Comprehensive metabolic panel  Result Value Ref Range   Sodium 139 136 - 145 mEq/L   Potassium 4.5 3.5 - 5.1 mEq/L   Chloride 104 98 - 109 mEq/L   CO2 25 22 - 29 mEq/L   Glucose 105 70 - 140 mg/dl   BUN 21.4 7.0 - 26.0 mg/dL   Creatinine 1.3 (H) 0.6 - 1.1 mg/dL   Total Bilirubin <0.30 0.20 - 1.20 mg/dL   Alkaline Phosphatase 80 40 - 150 U/L   AST 11 5 - 34 U/L   ALT 9 0 - 55 U/L   Total Protein 7.1 6.4 - 8.3 g/dL   Albumin 3.4 (L) 3.5 - 5.0 g/dL   Calcium 9.6 8.4 - 10.4 mg/dL    Anion Gap 10 3 - 11 mEq/L   EGFR 55 (L) >90 ml/min/1.73 m2   Urine culture 04-23-15 multiple species  Studies/Results:  No results found.  Medications: I have reviewed the patient's current medications. Will use granix cycle 2 days 6 and 7, then I will see her with labs and possible additional granix day 8  She will begin claritin day of chemo and continue thru granix/ until aches resolved. OK to use 1/2 - 1 oxycodone 5 mg prn aches after chemo/ gCSF Increase miralax and colace after chemo to keep bowels moving daily  DISCUSSION meds reviewed as above. She understands premedication decadron times for taxol  Knee pain may improve with systemic steroids around taxol, but if still bothersome  would be ok for steroid injection during week prior to cycle 3 - she will request appointment with Dr Mingo Amber for that week.   Patient tells me that she does not know how to keep herself occupied during day, as she is used to working and going to school. 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 Message sent to support staff after visit.  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 Pathology reviewed at [facility]. Taxol carboplatin begun 04-12-15, neutropenic by day 12 cycle 1. Add granix starting day 6 due to severe taxol aches cycle 1. No increase carbo for cycle 2 due 05-03-15 2.synchronous IA grade 1 endometrioid adenocarcinoma of uterus, treated surgically 3 Chemo neutropenia cycle 1: counts good for treatment 1-26, will need gCSF support for all subsequent cycles 4.morbid obesity, BMI 62. Cycle 1 carbo capped at 750 mg per [place] protocol; with renal function and ANC 0.1 this first cycle, will not increase dose for cycle 2 on 05-03-15.. Will ask [place] nutritionist to see.  5.poor peripheral venous  access: power PAC by IR for chemo 04-11-15 6.pyelonephritis 02-2015, resolved.  7.acute renal failure with creatinine 12.4 at presentation with pyelonephritis, resolved. Creatinine essentially stable at 1.3 by labs today. Encouraged good hydration and will follow renal function closely with carboplatin. Dosing carbo as noted 8.overdue mammograms, which we will address when possible. 9.perforated diverticular disease with temporary colostomy 2008. See ? Colonoscopy above 10..flu vaccine 12-12-14 11. no DVT by LE dopplers after 03-29-15. Completed post op prophylactic lovenox LE swelling improved.. [email or web address] iron deficiency anemia by labs available after visit: tolerating oral ferrous fumarate, continue. f/u colonoscopy information 13.history of PCN allergy, which was not an anaphylactic reaction.  14.financial concerns: appreciate assistance from [facility] financial staff 15.PAC in 16.urine culture negative 1-16 (when neutropenic), stopped empiric cipro on 04-25-15   All questions answered. Chemo and granix orders entered. Time spent 25 min including >50% counseling and coordination of care. Cc Dr Caryl Ada, MD   04/30/2015, 4:32 PM

## 2015-04-30 NOTE — Patient Instructions (Signed)
Begin claritin 10 mg daily starting day of chemo and continue until all aches have gone  Increase laxatives (miralax and colace) beginning day of chemo to keep bowels moving well daily

## 2015-05-01 DIAGNOSIS — D701 Agranulocytosis secondary to cancer chemotherapy: Secondary | ICD-10-CM | POA: Insufficient documentation

## 2015-05-01 DIAGNOSIS — T451X5A Adverse effect of antineoplastic and immunosuppressive drugs, initial encounter: Secondary | ICD-10-CM | POA: Insufficient documentation

## 2015-05-01 DIAGNOSIS — Z95828 Presence of other vascular implants and grafts: Secondary | ICD-10-CM | POA: Insufficient documentation

## 2015-05-03 ENCOUNTER — Other Ambulatory Visit (HOSPITAL_BASED_OUTPATIENT_CLINIC_OR_DEPARTMENT_OTHER): Payer: BLUE CROSS/BLUE SHIELD

## 2015-05-03 ENCOUNTER — Ambulatory Visit (HOSPITAL_BASED_OUTPATIENT_CLINIC_OR_DEPARTMENT_OTHER): Payer: BLUE CROSS/BLUE SHIELD

## 2015-05-03 VITALS — BP 149/84 | HR 82 | Temp 98.0°F | Resp 16

## 2015-05-03 DIAGNOSIS — C561 Malignant neoplasm of right ovary: Secondary | ICD-10-CM

## 2015-05-03 DIAGNOSIS — Z5111 Encounter for antineoplastic chemotherapy: Secondary | ICD-10-CM

## 2015-05-03 LAB — COMPREHENSIVE METABOLIC PANEL
ALBUMIN: 3.5 g/dL (ref 3.5–5.0)
ALK PHOS: 79 U/L (ref 40–150)
ANION GAP: 10 meq/L (ref 3–11)
AST: 11 U/L (ref 5–34)
BUN: 27.8 mg/dL — ABNORMAL HIGH (ref 7.0–26.0)
CALCIUM: 10.5 mg/dL — AB (ref 8.4–10.4)
CO2: 23 mEq/L (ref 22–29)
CREATININE: 1.2 mg/dL — AB (ref 0.6–1.1)
Chloride: 106 mEq/L (ref 98–109)
EGFR: 60 mL/min/{1.73_m2} — AB (ref >90)
Glucose: 211 mg/dl — ABNORMAL HIGH (ref 70–140)
Potassium: 4.9 mEq/L (ref 3.5–5.1)
Sodium: 139 mEq/L (ref 136–145)
TOTAL PROTEIN: 7.7 g/dL (ref 6.4–8.3)

## 2015-05-03 LAB — CBC WITH DIFFERENTIAL/PLATELET
BASO%: 0.3 % (ref 0.0–2.0)
BASOS ABS: 0 10*3/uL (ref 0.0–0.1)
EOS ABS: 0 10*3/uL (ref 0.0–0.5)
EOS%: 0 % (ref 0.0–7.0)
HCT: 34.5 % — ABNORMAL LOW (ref 34.8–46.6)
HEMOGLOBIN: 11.2 g/dL — AB (ref 11.6–15.9)
LYMPH%: 12.7 % — AB (ref 14.0–49.7)
MCH: 27.8 pg (ref 25.1–34.0)
MCHC: 32.6 g/dL (ref 31.5–36.0)
MCV: 85.4 fL (ref 79.5–101.0)
MONO#: 0 10*3/uL — AB (ref 0.1–0.9)
MONO%: 0.3 % (ref 0.0–14.0)
NEUT#: 8.1 10*3/uL — ABNORMAL HIGH (ref 1.5–6.5)
NEUT%: 86.7 % — ABNORMAL HIGH (ref 38.4–76.8)
PLATELETS: 147 10*3/uL (ref 145–400)
RBC: 4.04 10*6/uL (ref 3.70–5.45)
RDW: 16.5 % — ABNORMAL HIGH (ref 11.2–14.5)
WBC: 9.4 10*3/uL (ref 3.9–10.3)
lymph#: 1.2 10*3/uL (ref 0.9–3.3)

## 2015-05-03 MED ORDER — DIPHENHYDRAMINE HCL 50 MG/ML IJ SOLN
INTRAMUSCULAR | Status: AC
  Filled 2015-05-03: qty 1

## 2015-05-03 MED ORDER — HEPARIN SOD (PORK) LOCK FLUSH 100 UNIT/ML IV SOLN
500.0000 [IU] | Freq: Once | INTRAVENOUS | Status: AC | PRN
  Administered 2015-05-03: 500 [IU]
  Filled 2015-05-03: qty 5

## 2015-05-03 MED ORDER — SODIUM CHLORIDE 0.9 % IV SOLN
Freq: Once | INTRAVENOUS | Status: AC
  Administered 2015-05-03: 10:00:00 via INTRAVENOUS
  Filled 2015-05-03: qty 8

## 2015-05-03 MED ORDER — SODIUM CHLORIDE 0.9 % IJ SOLN
10.0000 mL | INTRAMUSCULAR | Status: DC | PRN
  Administered 2015-05-03: 10 mL
  Filled 2015-05-03: qty 10

## 2015-05-03 MED ORDER — DIPHENHYDRAMINE HCL 50 MG/ML IJ SOLN
50.0000 mg | Freq: Once | INTRAMUSCULAR | Status: AC
  Administered 2015-05-03: 50 mg via INTRAVENOUS

## 2015-05-03 MED ORDER — FAMOTIDINE IN NACL 20-0.9 MG/50ML-% IV SOLN
20.0000 mg | Freq: Once | INTRAVENOUS | Status: AC
  Administered 2015-05-03: 20 mg via INTRAVENOUS

## 2015-05-03 MED ORDER — SODIUM CHLORIDE 0.9 % IV SOLN
750.0000 mg | Freq: Once | INTRAVENOUS | Status: AC
  Administered 2015-05-03: 750 mg via INTRAVENOUS
  Filled 2015-05-03: qty 75

## 2015-05-03 MED ORDER — SODIUM CHLORIDE 0.9 % IV SOLN
175.0000 mg/m2 | Freq: Once | INTRAVENOUS | Status: AC
  Administered 2015-05-03: 492 mg via INTRAVENOUS
  Filled 2015-05-03: qty 82

## 2015-05-03 MED ORDER — FAMOTIDINE IN NACL 20-0.9 MG/50ML-% IV SOLN
INTRAVENOUS | Status: AC
  Filled 2015-05-03: qty 50

## 2015-05-03 MED ORDER — SODIUM CHLORIDE 0.9 % IV SOLN
Freq: Once | INTRAVENOUS | Status: AC
  Administered 2015-05-03: 10:00:00 via INTRAVENOUS

## 2015-05-03 NOTE — Patient Instructions (Signed)
[facility] Discharge Instructions for Patients Receiving Chemotherapy  Today you received the following chemotherapy agents Taxol/Carboplatin  To help prevent nausea and vomiting after your treatment, we encourage you to take your nausea medication    If you develop nausea and vomiting that is not controlled by your nausea medication, call the clinic.   BELOW ARE SYMPTOMS THAT SHOULD BE REPORTED IMMEDIATELY:  *FEVER GREATER THAN 100.5 F  *CHILLS WITH OR WITHOUT FEVER  NAUSEA AND VOMITING THAT IS NOT CONTROLLED WITH YOUR NAUSEA MEDICATION  *UNUSUAL SHORTNESS OF BREATH  *UNUSUAL BRUISING OR BLEEDING  TENDERNESS IN MOUTH AND THROAT WITH OR WITHOUT PRESENCE OF ULCERS  *URINARY PROBLEMS  *BOWEL PROBLEMS  UNUSUAL RASH Items with * indicate a potential emergency and should be followed up as soon as possible.  Feel free to call the clinic you have any questions or concerns. The clinic phone number is [phone].  Please show the CHEMO ALERT CARD at check-in to the Emergency Department and triage nurse.  Carboplatin injection What is this medicine? CARBOPLATIN (KAR boe pla tin) is a chemotherapy drug. It targets fast dividing cells, like cancer cells, and causes these cells to die. This medicine is used to treat ovarian cancer and many other cancers. This medicine may be used for other purposes; ask your health care provider or pharmacist if you have questions. What should I tell my health care provider before I take this medicine? They need to know if you have any of these conditions: -blood disorders -hearing problems -kidney disease -recent or ongoing radiation therapy -an unusual or allergic reaction to carboplatin, cisplatin, other chemotherapy, other medicines, foods, dyes, or preservatives -pregnant or trying to get pregnant -breast-feeding How should I use this medicine? This drug is usually given as an infusion into a vein. It is administered in a  hospital or clinic by a specially trained health care professional. Talk to your pediatrician regarding the use of this medicine in children. Special care may be needed. Overdosage: If you think you have taken too much of this medicine contact a poison control center or emergency room at once. NOTE: This medicine is only for you. Do not share this medicine with others. What if I miss a dose? It is important not to miss a dose. Call your doctor or health care professional if you are unable to keep an appointment. What may interact with this medicine? -medicines for seizures -medicines to increase blood counts like filgrastim, pegfilgrastim, sargramostim -some antibiotics like amikacin, gentamicin, neomycin, streptomycin, tobramycin -vaccines Talk to your doctor or health care professional before taking any of these medicines: -acetaminophen -aspirin -ibuprofen -ketoprofen -naproxen This list may not describe all possible interactions. Give your health care provider a list of all the medicines, herbs, non-prescription drugs, or dietary supplements you use. Also tell them if you smoke, drink alcohol, or use illegal drugs. Some items may interact with your medicine. What should I watch for while using this medicine? Your condition will be monitored carefully while you are receiving this medicine. You will need important blood work done while you are taking this medicine. This drug may make you feel generally unwell. This is not uncommon, as chemotherapy can affect healthy cells as well as cancer cells. Report any side effects. Continue your course of treatment even though you feel ill unless your doctor tells you to stop. In some cases, you may be given additional medicines to help with side effects. Follow all directions for their use. Call your   doctor or health care professional for advice if you get a fever, chills or sore throat, or other symptoms of a cold or flu. Do not treat yourself. This  drug decreases your body's ability to fight infections. Try to avoid being around people who are sick. This medicine may increase your risk to bruise or bleed. Call your doctor or health care professional if you notice any unusual bleeding. Be careful brushing and flossing your teeth or using a toothpick because you may get an infection or bleed more easily. If you have any dental work done, tell your dentist you are receiving this medicine. Avoid taking products that contain aspirin, acetaminophen, ibuprofen, naproxen, or ketoprofen unless instructed by your doctor. These medicines may hide a fever. Do not become pregnant while taking this medicine. Women should inform their doctor if they wish to become pregnant or think they might be pregnant. There is a potential for serious side effects to an unborn child. Talk to your health care professional or pharmacist for more information. Do not breast-feed an infant while taking this medicine. What side effects may I notice from receiving this medicine? Side effects that you should report to your doctor or health care professional as soon as possible: -allergic reactions like skin rash, itching or hives, swelling of the face, lips, or tongue -signs of infection - fever or chills, cough, sore throat, pain or difficulty passing urine -signs of decreased platelets or bleeding - bruising, pinpoint red spots on the skin, black, tarry stools, nosebleeds -signs of decreased red blood cells - unusually weak or tired, fainting spells, lightheadedness -breathing problems -changes in hearing -changes in vision -chest pain -high blood pressure -low blood counts - This drug may decrease the number of white blood cells, red blood cells and platelets. You may be at increased risk for infections and bleeding. -nausea and vomiting -pain, swelling, redness or irritation at the injection site -pain, tingling, numbness in the hands or feet -problems with balance,  talking, walking -trouble passing urine or change in the amount of urine Side effects that usually do not require medical attention (report to your doctor or health care professional if they continue or are bothersome): -hair loss -loss of appetite -metallic taste in the mouth or changes in taste This list may not describe all possible side effects. Call your doctor for medical advice about side effects. You may report side effects to FDA at 1-800-FDA-1088. Where should I keep my medicine? This drug is given in a hospital or clinic and will not be stored at home. NOTE: This sheet is a summary. It may not cover all possible information. If you have questions about this medicine, talk to your doctor, pharmacist, or health care provider.    2016, Elsevier/Gold Standard. (2007-06-29 14:38:05)  Paclitaxel injection What is this medicine? PACLITAXEL (PAK li TAX el) is a chemotherapy drug. It targets fast dividing cells, like cancer cells, and causes these cells to die. This medicine is used to treat ovarian cancer, breast cancer, and other cancers. This medicine may be used for other purposes; ask your health care provider or pharmacist if you have questions. What should I tell my health care provider before I take this medicine? They need to know if you have any of these conditions: -blood disorders -irregular heartbeat -infection (especially a virus infection such as chickenpox, cold sores, or herpes) -liver disease -previous or ongoing radiation therapy -an unusual or allergic reaction to paclitaxel, alcohol, polyoxyethylated castor oil, other chemotherapy agents, other medicines,   foods, dyes, or preservatives -pregnant or trying to get pregnant -breast-feeding How should I use this medicine? This drug is given as an infusion into a vein. It is administered in a hospital or clinic by a specially trained health care professional. Talk to your pediatrician regarding the use of this medicine  in children. Special care may be needed. Overdosage: If you think you have taken too much of this medicine contact a poison control center or emergency room at once. NOTE: This medicine is only for you. Do not share this medicine with others. What if I miss a dose? It is important not to miss your dose. Call your doctor or health care professional if you are unable to keep an appointment. What may interact with this medicine? Do not take this medicine with any of the following medications: -disulfiram -metronidazole This medicine may also interact with the following medications: -cyclosporine -diazepam -ketoconazole -medicines to increase blood counts like filgrastim, pegfilgrastim, sargramostim -other chemotherapy drugs like cisplatin, doxorubicin, epirubicin, etoposide, teniposide, vincristine -quinidine -testosterone -vaccines -verapamil Talk to your doctor or health care professional before taking any of these medicines: -acetaminophen -aspirin -ibuprofen -ketoprofen -naproxen This list may not describe all possible interactions. Give your health care provider a list of all the medicines, herbs, non-prescription drugs, or dietary supplements you use. Also tell them if you smoke, drink alcohol, or use illegal drugs. Some items may interact with your medicine. What should I watch for while using this medicine? Your condition will be monitored carefully while you are receiving this medicine. You will need important blood work done while you are taking this medicine. This drug may make you feel generally unwell. This is not uncommon, as chemotherapy can affect healthy cells as well as cancer cells. Report any side effects. Continue your course of treatment even though you feel ill unless your doctor tells you to stop. This medicine can cause serious allergic reactions. To reduce your risk you will need to take other medicine(s) before treatment with this medicine. In some cases, you may  be given additional medicines to help with side effects. Follow all directions for their use. Call your doctor or health care professional for advice if you get a fever, chills or sore throat, or other symptoms of a cold or flu. Do not treat yourself. This drug decreases your body's ability to fight infections. Try to avoid being around people who are sick. This medicine may increase your risk to bruise or bleed. Call your doctor or health care professional if you notice any unusual bleeding. Be careful brushing and flossing your teeth or using a toothpick because you may get an infection or bleed more easily. If you have any dental work done, tell your dentist you are receiving this medicine. Avoid taking products that contain aspirin, acetaminophen, ibuprofen, naproxen, or ketoprofen unless instructed by your doctor. These medicines may hide a fever. Do not become pregnant while taking this medicine. Women should inform their doctor if they wish to become pregnant or think they might be pregnant. There is a potential for serious side effects to an unborn child. Talk to your health care professional or pharmacist for more information. Do not breast-feed an infant while taking this medicine. Men are advised not to father a child while receiving this medicine. This product may contain alcohol. Ask your pharmacist or healthcare provider if this medicine contains alcohol. Be sure to tell all healthcare providers you are taking this medicine. Certain medicines, like metronidazole and disulfiram, can cause   an unpleasant reaction when taken with alcohol. The reaction includes flushing, headache, nausea, vomiting, sweating, and increased thirst. The reaction can last from 30 minutes to several hours. What side effects may I notice from receiving this medicine? Side effects that you should report to your doctor or health care professional as soon as possible: -allergic reactions like skin rash, itching or hives,  swelling of the face, lips, or tongue -low blood counts - This drug may decrease the number of white blood cells, red blood cells and platelets. You may be at increased risk for infections and bleeding. -signs of infection - fever or chills, cough, sore throat, pain or difficulty passing urine -signs of decreased platelets or bleeding - bruising, pinpoint red spots on the skin, black, tarry stools, nosebleeds -signs of decreased red blood cells - unusually weak or tired, fainting spells, lightheadedness -breathing problems -chest pain -high or low blood pressure -mouth sores -nausea and vomiting -pain, swelling, redness or irritation at the injection site -pain, tingling, numbness in the hands or feet -slow or irregular heartbeat -swelling of the ankle, feet, hands Side effects that usually do not require medical attention (report to your doctor or health care professional if they continue or are bothersome): -bone pain -complete hair loss including hair on your head, underarms, pubic hair, eyebrows, and eyelashes -changes in the color of fingernails -diarrhea -loosening of the fingernails -loss of appetite -muscle or joint pain -red flush to skin -sweating This list may not describe all possible side effects. Call your doctor for medical advice about side effects. You may report side effects to FDA at 1-800-FDA-1088. Where should I keep my medicine? This drug is given in a hospital or clinic and will not be stored at home. NOTE: This sheet is a summary. It may not cover all possible information. If you have questions about this medicine, talk to your doctor, pharmacist, or health care provider.    2016, Elsevier/Gold Standard. (2014-11-09 13:02:56)     

## 2015-05-08 ENCOUNTER — Ambulatory Visit (HOSPITAL_BASED_OUTPATIENT_CLINIC_OR_DEPARTMENT_OTHER): Payer: BLUE CROSS/BLUE SHIELD

## 2015-05-08 VITALS — BP 147/76 | HR 107 | Temp 98.4°F

## 2015-05-08 DIAGNOSIS — Z5189 Encounter for other specified aftercare: Secondary | ICD-10-CM

## 2015-05-08 DIAGNOSIS — C561 Malignant neoplasm of right ovary: Secondary | ICD-10-CM | POA: Diagnosis not present

## 2015-05-08 MED ORDER — TBO-FILGRASTIM 480 MCG/0.8ML ~~LOC~~ SOSY
480.0000 ug | PREFILLED_SYRINGE | Freq: Once | SUBCUTANEOUS | Status: AC
  Administered 2015-05-08: 480 ug via SUBCUTANEOUS
  Filled 2015-05-08: qty 0.8

## 2015-05-09 ENCOUNTER — Other Ambulatory Visit: Payer: Self-pay | Admitting: Oncology

## 2015-05-09 ENCOUNTER — Ambulatory Visit (HOSPITAL_BASED_OUTPATIENT_CLINIC_OR_DEPARTMENT_OTHER): Payer: BLUE CROSS/BLUE SHIELD

## 2015-05-09 VITALS — BP 130/77 | HR 127 | Temp 98.8°F

## 2015-05-09 DIAGNOSIS — Z5189 Encounter for other specified aftercare: Secondary | ICD-10-CM

## 2015-05-09 DIAGNOSIS — C561 Malignant neoplasm of right ovary: Secondary | ICD-10-CM

## 2015-05-09 MED ORDER — TBO-FILGRASTIM 480 MCG/0.8ML ~~LOC~~ SOSY
480.0000 ug | PREFILLED_SYRINGE | Freq: Once | SUBCUTANEOUS | Status: AC
  Administered 2015-05-09: 480 ug via SUBCUTANEOUS
  Filled 2015-05-09: qty 0.8

## 2015-05-10 ENCOUNTER — Encounter: Payer: Self-pay | Admitting: Oncology

## 2015-05-10 ENCOUNTER — Other Ambulatory Visit: Payer: Self-pay

## 2015-05-10 ENCOUNTER — Ambulatory Visit (HOSPITAL_BASED_OUTPATIENT_CLINIC_OR_DEPARTMENT_OTHER): Payer: BLUE CROSS/BLUE SHIELD | Admitting: Oncology

## 2015-05-10 ENCOUNTER — Ambulatory Visit: Payer: BLUE CROSS/BLUE SHIELD

## 2015-05-10 ENCOUNTER — Other Ambulatory Visit (HOSPITAL_BASED_OUTPATIENT_CLINIC_OR_DEPARTMENT_OTHER): Payer: BLUE CROSS/BLUE SHIELD

## 2015-05-10 VITALS — BP 124/89 | HR 115 | Temp 97.8°F | Resp 18 | Ht 65.0 in | Wt 340.5 lb

## 2015-05-10 VITALS — BP 142/64 | HR 100 | Temp 98.2°F | Resp 20

## 2015-05-10 DIAGNOSIS — D6959 Other secondary thrombocytopenia: Secondary | ICD-10-CM

## 2015-05-10 DIAGNOSIS — N183 Chronic kidney disease, stage 3 unspecified: Secondary | ICD-10-CM

## 2015-05-10 DIAGNOSIS — C561 Malignant neoplasm of right ovary: Secondary | ICD-10-CM

## 2015-05-10 DIAGNOSIS — C562 Malignant neoplasm of left ovary: Secondary | ICD-10-CM | POA: Diagnosis not present

## 2015-05-10 DIAGNOSIS — D701 Agranulocytosis secondary to cancer chemotherapy: Secondary | ICD-10-CM

## 2015-05-10 DIAGNOSIS — K5909 Other constipation: Secondary | ICD-10-CM

## 2015-05-10 DIAGNOSIS — D509 Iron deficiency anemia, unspecified: Secondary | ICD-10-CM

## 2015-05-10 DIAGNOSIS — D702 Other drug-induced agranulocytosis: Secondary | ICD-10-CM

## 2015-05-10 DIAGNOSIS — K219 Gastro-esophageal reflux disease without esophagitis: Secondary | ICD-10-CM

## 2015-05-10 DIAGNOSIS — K5900 Constipation, unspecified: Secondary | ICD-10-CM

## 2015-05-10 DIAGNOSIS — C541 Malignant neoplasm of endometrium: Secondary | ICD-10-CM

## 2015-05-10 DIAGNOSIS — T451X5A Adverse effect of antineoplastic and immunosuppressive drugs, initial encounter: Secondary | ICD-10-CM

## 2015-05-10 DIAGNOSIS — E860 Dehydration: Secondary | ICD-10-CM

## 2015-05-10 DIAGNOSIS — R112 Nausea with vomiting, unspecified: Secondary | ICD-10-CM | POA: Diagnosis not present

## 2015-05-10 DIAGNOSIS — Z95828 Presence of other vascular implants and grafts: Secondary | ICD-10-CM

## 2015-05-10 LAB — COMPREHENSIVE METABOLIC PANEL
ALBUMIN: 3.9 g/dL (ref 3.5–5.0)
ALK PHOS: 76 U/L (ref 40–150)
ALT: 15 U/L (ref 0–55)
AST: 17 U/L (ref 5–34)
Anion Gap: 12 mEq/L — ABNORMAL HIGH (ref 3–11)
BILIRUBIN TOTAL: 0.65 mg/dL (ref 0.20–1.20)
BUN: 25.6 mg/dL (ref 7.0–26.0)
CO2: 23 meq/L (ref 22–29)
CREATININE: 1.6 mg/dL — AB (ref 0.6–1.1)
Calcium: 10.5 mg/dL — ABNORMAL HIGH (ref 8.4–10.4)
Chloride: 102 mEq/L (ref 98–109)
EGFR: 42 mL/min/{1.73_m2} — AB (ref >90)
GLUCOSE: 139 mg/dL (ref 70–140)
Potassium: 4.6 mEq/L (ref 3.5–5.1)
SODIUM: 138 meq/L (ref 136–145)
TOTAL PROTEIN: 7.7 g/dL (ref 6.4–8.3)

## 2015-05-10 LAB — CBC WITH DIFFERENTIAL/PLATELET
BASO%: 1.4 % (ref 0.0–2.0)
BASOS ABS: 0 10*3/uL (ref 0.0–0.1)
EOS%: 2.8 % (ref 0.0–7.0)
Eosinophils Absolute: 0.1 10*3/uL (ref 0.0–0.5)
HCT: 34.5 % — ABNORMAL LOW (ref 34.8–46.6)
HEMOGLOBIN: 11.5 g/dL — AB (ref 11.6–15.9)
LYMPH#: 1.7 10*3/uL (ref 0.9–3.3)
LYMPH%: 78.3 % — ABNORMAL HIGH (ref 14.0–49.7)
MCH: 28.6 pg (ref 25.1–34.0)
MCHC: 33.3 g/dL (ref 31.5–36.0)
MCV: 85.8 fL (ref 79.5–101.0)
MONO#: 0.1 10*3/uL (ref 0.1–0.9)
MONO%: 6.5 % (ref 0.0–14.0)
NEUT%: 11 % — ABNORMAL LOW (ref 38.4–76.8)
NEUTROS ABS: 0.2 10*3/uL — AB (ref 1.5–6.5)
NRBC: 0 % (ref 0–0)
Platelets: 102 10*3/uL — ABNORMAL LOW (ref 145–400)
RBC: 4.02 10*6/uL (ref 3.70–5.45)
RDW: 15.7 % — AB (ref 11.2–14.5)
WBC: 2.2 10*3/uL — AB (ref 3.9–10.3)

## 2015-05-10 MED ORDER — FAMOTIDINE IN NACL 20-0.9 MG/50ML-% IV SOLN
20.0000 mg | Freq: Once | INTRAVENOUS | Status: AC
  Administered 2015-05-10: 20 mg via INTRAVENOUS

## 2015-05-10 MED ORDER — CIPROFLOXACIN HCL 250 MG PO TABS: 250.0000 mg | ORAL_TABLET | Freq: Two times a day (BID) | ORAL | Status: DC

## 2015-05-10 MED ORDER — PROCHLORPERAZINE EDISYLATE 5 MG/ML IJ SOLN
10.0000 mg | Freq: Four times a day (QID) | INTRAMUSCULAR | Status: DC | PRN
  Administered 2015-05-10: 10 mg via INTRAVENOUS

## 2015-05-10 MED ORDER — SODIUM CHLORIDE 0.9 % IJ SOLN
10.0000 mL | Freq: Once | INTRAMUSCULAR | Status: AC
  Administered 2015-05-10: 10 mL via INTRAVENOUS
  Filled 2015-05-10: qty 10

## 2015-05-10 MED ORDER — FAMOTIDINE IN NACL 20-0.9 MG/50ML-% IV SOLN
INTRAVENOUS | Status: AC
  Filled 2015-05-10: qty 50

## 2015-05-10 MED ORDER — LACTULOSE 10 GM/15ML PO SOLN: 20.0000 g | Freq: Two times a day (BID) | ORAL | Status: DC | PRN

## 2015-05-10 MED ORDER — PROCHLORPERAZINE EDISYLATE 5 MG/ML IJ SOLN
INTRAMUSCULAR | Status: AC
  Filled 2015-05-10: qty 2

## 2015-05-10 MED ORDER — TBO-FILGRASTIM 480 MCG/0.8ML ~~LOC~~ SOSY
480.0000 ug | PREFILLED_SYRINGE | Freq: Once | SUBCUTANEOUS | Status: AC
  Administered 2015-05-10: 480 ug via SUBCUTANEOUS
  Filled 2015-05-10: qty 0.8

## 2015-05-10 MED ORDER — HEPARIN SOD (PORK) LOCK FLUSH 100 UNIT/ML IV SOLN
500.0000 [IU] | Freq: Once | INTRAVENOUS | Status: AC
  Administered 2015-05-10: 500 [IU] via INTRAVENOUS
  Filled 2015-05-10: qty 5

## 2015-05-10 MED ORDER — SODIUM CHLORIDE 0.9 % IV SOLN
1000.0000 mL | Freq: Once | INTRAVENOUS | Status: AC
  Administered 2015-05-10: 1000 mL via INTRAVENOUS

## 2015-05-10 MED ORDER — PANTOPRAZOLE SODIUM 40 MG PO TBEC: 40.0000 mg | DELAYED_RELEASE_TABLET | Freq: Every day | ORAL | Status: DC

## 2015-05-10 MED FILL — CIPROFLOXACIN HCL 250 MG TA: 250 | 3 days supply | Qty: 6 | Fill #0

## 2015-05-10 MED FILL — PANTOPRAZOLE SOD DR 40 MG T: 40 | 30 days supply | Qty: 30 | Fill #0

## 2015-05-10 MED FILL — CONSTULOSE 10 GM/15 ML SOLN: 10 | 14 days supply | Qty: 840 | Fill #0

## 2015-05-10 NOTE — Patient Instructions (Signed)

## 2015-05-10 NOTE — Progress Notes (Signed)
OFFICE PROGRESS NOTE   May 10, 2015   Physicians:Emma Rexford Maus, Kayleen Memos, MD (PCP),   INTERVAL HISTORY:  Patient is seen, alone for scheduled visit, with ANC 0.2 today (day 8 cycle 2 carbo taxol) despite granix days 6 and 7. T max was 100 last pm; she is not febrile now. She has had constipation despite increased laxatives after chemo, nausea with some vomiting, and is not taking po fluids well.  Carboplatin was capped at 750 mg due to cytopenias cycle 1 at that dose (not AUC 6 by acutal weight); taxol was 175 mg/m2 by actual weight.  Patient increased laxatives to bid miralax and colace starting day prior to cycle 2 chemo, however still had no BM until small amount last pm (day 7) and small amount this AM. She kept down juice, gingerale, water and 1 Boost yesterday. She has vomited after solid foods, despite zofran. Denies frank orthostatic symptoms. Has had aches, no peripheral neuropathy symptoms. She did sleep after ativan last pm, but has been awake since 0400. More GERD. No bleeding. No SOB with present activity. Abdominal discomfort seems related to constipation. No LE swelling. No problems with PAC. She is voiding.  Remainder of 10 point Review of Systems unchanged/ negative.     PAC by IR 04-11-15 Flu vaccine 12-12-14  Driven to office by mother.  ONCOLOGIC HISTORY Patient had intermittent right lower quadrant abdominal pain since Aug 2016, with pelvic US 11-30-14 with uterine fibroids (11.2 x 4.8 x 6.8 cm. 1.7 x 1.7 x 2.0 cm subserosal fibroid in the right uterine body. 6.3 x 3.6 x 5.7 cm probable exophytic/pedunculated fibroid along the anterior uterine fundus), endometrium poorly visualized, right ovary poorly visualized but measured 4 x 2.8 x 4.4 cm and left ovary 4.9 x 3.4 x 4.9 cm with probable corpus luteal cyst called. Note patient is morbidly obese, with BMI >60. She had some fevers during that time. Patient was admitted to [facility] 02-18-15 thru 02-21-15 with  pyelonephritis and acute renal failure (creatinine 12.4), both of those problems resolving entirely with treatment. CT AP and MRI pelvis done to evaluate the pyelonephritis demonstrated cystic and solid pelvic mass appearing to be arising from the right adnexa 13 x 9 x 17.5 cm, with a second cystic and solid lesion within the left adnexa measuring 4.4 x 6.5 x 5.3 cm. A third cystic and solid mass was seen anterior to the uterus and superior to the bladder measuring 4.2 x 5.4 x 5.3 cm. She had no pelvic lymphadenopathy, no carcinomatosis, and CA 125 was normal at 21 U/mL. She was seen in consultation by Dr Denman George, with exploratory laparotomy 03-23-15 with TAH BSO, partial small bowel resection and radical debulking, with additional ventral hernia repair. She was hospitalized with the surgery from 12-6 thru 03-16-15, discharged on prophylactic lovenox which she continues, no post op complications in hospital. Jodell Cipro were removed on 03-27-15, some separation of wound with wound care continuing by patient at home. 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 She saw Dr Denman George 03-29-15, with recommendation for adjuvant chemotherapy and follow up with gyn oncology expected after 6 cycles of chemotherapy. Cycle 1 carbo taxol given 04-12-15, carbo capped at 750 mg and taxol dosed at 175 mg/m2 using actual weight. She was neutropenic with ANCX 0.1 on day 12 cycle 1. Doses same for cycle 2,  granix begun day 6, ANC 0.2 on day 8.   Objective:  Vital signs in last 24 hours:  BP 124/89 mmHg  Pulse 115  Temp(Src) 97.8 F (36.6 C) (Oral)  Resp 18  Ht _0  (1.651 m)  Wt 340 lb 8 oz (154.45 kg)  BMI 56.66 kg/m2  SpO2 100%  LMP 10/16/2013 (Approximate) Weight down 17 lbs from 04-30-15 Alert, oriented and appropriate. Ambulatory,  looks fatigued and uncomfortable tho not in acute distress.   HEENT:PERRL, sclerae not icteric. Oral mucosa not markedly dry, no lesions, posterior pharynx clear.  Neck supple. No JVD.  Lymphatics:no cervical,supraclavicular adenopathy Resp: clear to auscultation bilaterally and normal percussion bilaterally Cardio: tachy, regular rate and rhythm. No gallop. GI: abdomen obese, soft, nontender, not obviously distended. Minimal bowel sounds. Surgical incision not remarkable. Musculoskeletal/ Extremities: without pitting edema, cords, tenderness Neuro: no peripheral neuropathy. Otherwise nonfocal Skin without rash, ecchymosis, petechiae Portacath-without erythema or tenderness   PAC was flipped when attempted access by 2 RNs after MD exam; was manipulated back into correct position by another RN, then used for IVF now. NOTE peripheral IV access extremely difficult.  Lab Results:  Results for orders placed or performed in visit on 05/10/15  CBC with Differential  Result Value Ref Range   WBC 2.2 (L) 3.9 - 10.3 10e3/uL   NEUT# 0.2 (LL) 1.5 - 6.5 10e3/uL   HGB 11.5 (L) 11.6 - 15.9 g/dL   HCT 34.5 (L) 34.8 - 46.6 %   Platelets 102 (L) 145 - 400 10e3/uL   MCV 85.8 79.5 - 101.0 fL   MCH 28.6 25.1 - 34.0 pg   MCHC 33.3 31.5 - 36.0 g/dL   RBC 4.02 3.70 - 5.45 10e6/uL   RDW 15.7 (H) 11.2 - 14.5 %   lymph# 1.7 0.9 - 3.3 10e3/uL   MONO# 0.1 0.1 - 0.9 10e3/uL   Eosinophils Absolute 0.1 0.0 - 0.5 10e3/uL   Basophils Absolute 0.0 0.0 - 0.1 10e3/uL   NEUT% 11.0 (L) 38.4 - 76.8 %   LYMPH% 78.3 (H) 14.0 - 49.7 %   MONO% 6.5 0.0 - 14.0 %   EOS% 2.8 0.0 - 7.0 %   BASO% 1.4 0.0 - 2.0 %   nRBC 0 0 - 0 %  Comprehensive metabolic panel  Result Value Ref Range   Sodium 138 136 - 145 mEq/L   Potassium 4.6 3.5 - 5.1 mEq/L   Chloride 102 98 - 109 mEq/L   CO2 23 22 - 29 mEq/L   Glucose 139 70 - 140 mg/dl   BUN 25.6 7.0 - 26.0 mg/dL   Creatinine 1.6 (H) 0.6 - 1.1 mg/dL   Total Bilirubin 0.65 0.20 -  1.20 mg/dL   Alkaline Phosphatase 76 40 - 150 U/L   AST 17 5 - 34 U/L   ALT 15 0 - 55 U/L   Total Protein 7.7 6.4 - 8.3 g/dL   Albumin 3.9 3.5 - 5.0 g/dL   Calcium 10.5 (H) 8.4 - 10.4 mg/dL   Anion Gap 12 (H) 3 - 11 mEq/L   EGFR 42 (L) >90 ml/min/1.73 m2     Studies/Results:  No results found.  Medications: I have reviewed the patient's current medications. Granix today, repeat CBC + granix on 2-3, continue granix if counts not out of neutropenic range. IVF + IV compazine and pepcid today (compazine chosen over zofran due to constipation) Add lactulose 30 cc bid to present bid miralax and colace, and protonix daily. No suppositories or enemas  until [place] recovered.   DISCUSSION Meds as above. Push po fluids. IVF with antiemetic and pepcid given in exam room today due to neutropenia.  Neutropenic precautions. Begin cipro 250 mg bid x 3 days prophylactic with severe neutropenia.   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 Pathology reviewed at [facility]. Taxol carboplatin begun 04-12-15, neutropenic by day 12 cycle 1, rapid increase in counts with 2 doses granix then. Markedly neutropenic day 8 cycle 2 today, despite granix begun day 6 this cycle (timing due to severity of taxol aches). Neutropenic precautions, continue granix, repeat CBC 05-11-15 with further orders then. Prophylactic cipro x 3 days.  I will see her 2-9, due cycle 3 on 05-24-15. Will need to decrease carbo and try neulasta even with taxol aches. 2.synchronous IA grade 1 endometrioid adenocarcinoma of uterus, treated surgically 3. PAC flipped today, manipulated back into correct position for access by RN. Peripheral IV access very difficult 4.morbid obesity, BMI 62. Cycles 1 and 2 carbo capped at 750 mg per [place] protocol, but will need to decrease for subsequent  cycles. 5.dehydration related to poor po intake, N/V from chemo. IVF now 6.constipation despite increasing laxatives starting day prior to cycle 2 chemo. Add sorbital/ lactulose now. NO suppositories/ enemas while neutropenic 7.acute renal failure with creatinine 12.4 at presentation with pyelonephritis, resolved. Creatinine a little higher at 1.6 today, calculates to CKD 3, IVF and follow renal function closely with carboplatin. Dosing carbo as noted 8.overdue mammograms, which we will address when possible. 9.perforated diverticular disease with temporary colostomy 2008.  10..flu vaccine 12-12-14 11. no DVT by LE dopplers after 03-29-15. Completed post op prophylactic lovenox LE swelling improved.. [email or web address] iron deficiency anemia by labs available after visit: tolerating oral ferrous fumarate, continue. f/u colonoscopy information 13.history of PCN allergy, which was not an anaphylactic reaction.  14.financial concerns: appreciate assistance from [facility]  15.increased GERD: add protonix po, given IV pepcid now 16.chemo thrombocytopenia no bleeding now, precautions reviewed, Follow  All questions answered. Time spent 45 min including >50% counseling and coordination of care. IVF, granix, and supportive care orders entered.    Izak Anding P, MD   05/10/2015, 3:49 PM

## 2015-05-10 NOTE — Progress Notes (Signed)
Initial attempt by Juliann Pulse and Val for port showed the port to be flipped. Dixie came to start a peripheral IV x1 attempt. Dixie assessed port and was able to flip it back so septum was palpable. Port accessed with good blood return.

## 2015-05-11 ENCOUNTER — Telehealth: Payer: Self-pay | Admitting: Oncology

## 2015-05-11 ENCOUNTER — Other Ambulatory Visit: Payer: Self-pay | Admitting: Oncology

## 2015-05-11 ENCOUNTER — Other Ambulatory Visit: Payer: Self-pay

## 2015-05-11 ENCOUNTER — Ambulatory Visit (HOSPITAL_BASED_OUTPATIENT_CLINIC_OR_DEPARTMENT_OTHER): Payer: BLUE CROSS/BLUE SHIELD

## 2015-05-11 ENCOUNTER — Other Ambulatory Visit (HOSPITAL_BASED_OUTPATIENT_CLINIC_OR_DEPARTMENT_OTHER): Payer: BLUE CROSS/BLUE SHIELD

## 2015-05-11 VITALS — BP 140/91 | HR 96 | Temp 98.0°F | Resp 16

## 2015-05-11 DIAGNOSIS — C561 Malignant neoplasm of right ovary: Secondary | ICD-10-CM | POA: Diagnosis not present

## 2015-05-11 DIAGNOSIS — D701 Agranulocytosis secondary to cancer chemotherapy: Secondary | ICD-10-CM

## 2015-05-11 DIAGNOSIS — T451X5A Adverse effect of antineoplastic and immunosuppressive drugs, initial encounter: Principal | ICD-10-CM

## 2015-05-11 LAB — CBC WITH DIFFERENTIAL/PLATELET
BASO%: 1.4 % (ref 0.0–2.0)
BASOS ABS: 0 10*3/uL (ref 0.0–0.1)
EOS ABS: 0.1 10*3/uL (ref 0.0–0.5)
EOS%: 4.6 % (ref 0.0–7.0)
HEMATOCRIT: 33.5 % — AB (ref 34.8–46.6)
HEMOGLOBIN: 11 g/dL — AB (ref 11.6–15.9)
LYMPH#: 1.6 10*3/uL (ref 0.9–3.3)
LYMPH%: 71.8 % — ABNORMAL HIGH (ref 14.0–49.7)
MCH: 28.2 pg (ref 25.1–34.0)
MCHC: 32.8 g/dL (ref 31.5–36.0)
MCV: 85.9 fL (ref 79.5–101.0)
MONO#: 0.5 10*3/uL (ref 0.1–0.9)
MONO%: 22.2 % — AB (ref 0.0–14.0)
NEUT%: 0 % — ABNORMAL LOW (ref 38.4–76.8)
NEUTROS ABS: 0 10*3/uL — AB (ref 1.5–6.5)
NRBC: 0 % (ref 0–0)
PLATELETS: 96 10*3/uL — AB (ref 145–400)
RBC: 3.9 10*6/uL (ref 3.70–5.45)
RDW: 15.8 % — AB (ref 11.2–14.5)
WBC: 2.2 10*3/uL — AB (ref 3.9–10.3)

## 2015-05-11 MED ORDER — TBO-FILGRASTIM 480 MCG/0.8ML ~~LOC~~ SOSY
480.0000 ug | PREFILLED_SYRINGE | Freq: Once | SUBCUTANEOUS | Status: AC
  Administered 2015-05-11: 480 ug via SUBCUTANEOUS
  Filled 2015-05-11: qty 0.8

## 2015-05-11 NOTE — Telephone Encounter (Signed)
Appointments made and avs printed per 2/3 pof

## 2015-05-11 NOTE — Patient Instructions (Signed)

## 2015-05-12 ENCOUNTER — Ambulatory Visit (HOSPITAL_BASED_OUTPATIENT_CLINIC_OR_DEPARTMENT_OTHER): Payer: BLUE CROSS/BLUE SHIELD

## 2015-05-12 ENCOUNTER — Other Ambulatory Visit: Payer: Self-pay | Admitting: Oncology

## 2015-05-12 ENCOUNTER — Ambulatory Visit: Payer: BLUE CROSS/BLUE SHIELD

## 2015-05-12 VITALS — BP 144/83 | HR 105 | Temp 99.1°F | Resp 18

## 2015-05-12 DIAGNOSIS — T451X5A Adverse effect of antineoplastic and immunosuppressive drugs, initial encounter: Secondary | ICD-10-CM

## 2015-05-12 DIAGNOSIS — K219 Gastro-esophageal reflux disease without esophagitis: Secondary | ICD-10-CM | POA: Insufficient documentation

## 2015-05-12 DIAGNOSIS — C561 Malignant neoplasm of right ovary: Secondary | ICD-10-CM

## 2015-05-12 DIAGNOSIS — D701 Agranulocytosis secondary to cancer chemotherapy: Secondary | ICD-10-CM | POA: Diagnosis not present

## 2015-05-12 DIAGNOSIS — N183 Chronic kidney disease, stage 3 unspecified: Secondary | ICD-10-CM | POA: Insufficient documentation

## 2015-05-12 DIAGNOSIS — K5900 Constipation, unspecified: Secondary | ICD-10-CM | POA: Insufficient documentation

## 2015-05-12 DIAGNOSIS — R112 Nausea with vomiting, unspecified: Secondary | ICD-10-CM | POA: Insufficient documentation

## 2015-05-12 DIAGNOSIS — E860 Dehydration: Secondary | ICD-10-CM | POA: Insufficient documentation

## 2015-05-12 DIAGNOSIS — D6959 Other secondary thrombocytopenia: Secondary | ICD-10-CM | POA: Insufficient documentation

## 2015-05-12 MED ORDER — TBO-FILGRASTIM 480 MCG/0.8ML ~~LOC~~ SOSY
480.0000 ug | PREFILLED_SYRINGE | Freq: Once | SUBCUTANEOUS | Status: AC
  Administered 2015-05-12: 480 ug via SUBCUTANEOUS
  Filled 2015-05-12: qty 0.8

## 2015-05-12 NOTE — Patient Instructions (Signed)

## 2015-05-14 ENCOUNTER — Ambulatory Visit: Payer: BLUE CROSS/BLUE SHIELD

## 2015-05-14 ENCOUNTER — Other Ambulatory Visit (HOSPITAL_BASED_OUTPATIENT_CLINIC_OR_DEPARTMENT_OTHER): Payer: BLUE CROSS/BLUE SHIELD

## 2015-05-14 ENCOUNTER — Telehealth: Payer: Self-pay

## 2015-05-14 ENCOUNTER — Other Ambulatory Visit: Payer: BLUE CROSS/BLUE SHIELD

## 2015-05-14 DIAGNOSIS — Z95828 Presence of other vascular implants and grafts: Secondary | ICD-10-CM

## 2015-05-14 DIAGNOSIS — C561 Malignant neoplasm of right ovary: Secondary | ICD-10-CM

## 2015-05-14 LAB — CBC WITH DIFFERENTIAL/PLATELET
BASO%: 0.2 % (ref 0.0–2.0)
Basophils Absolute: 0 10*3/uL (ref 0.0–0.1)
EOS%: 0.7 % (ref 0.0–7.0)
Eosinophils Absolute: 0.1 10*3/uL (ref 0.0–0.5)
HCT: 31 % — ABNORMAL LOW (ref 34.8–46.6)
HEMOGLOBIN: 10.2 g/dL — AB (ref 11.6–15.9)
LYMPH%: 20.3 % (ref 14.0–49.7)
MCH: 28.7 pg (ref 25.1–34.0)
MCHC: 32.9 g/dL (ref 31.5–36.0)
MCV: 87.3 fL (ref 79.5–101.0)
MONO#: 1.7 10*3/uL — ABNORMAL HIGH (ref 0.1–0.9)
MONO%: 12.5 % (ref 0.0–14.0)
NEUT%: 66.3 % (ref 38.4–76.8)
NEUTROS ABS: 8.9 10*3/uL — AB (ref 1.5–6.5)
Platelets: 117 10*3/uL — ABNORMAL LOW (ref 145–400)
RBC: 3.55 10*6/uL — ABNORMAL LOW (ref 3.70–5.45)
RDW: 16.2 % — AB (ref 11.2–14.5)
WBC: 13.3 10*3/uL — ABNORMAL HIGH (ref 3.9–10.3)
lymph#: 2.7 10*3/uL (ref 0.9–3.3)

## 2015-05-14 LAB — BASIC METABOLIC PANEL
Anion Gap: 11 mEq/L (ref 3–11)
BUN: 16.7 mg/dL (ref 7.0–26.0)
CHLORIDE: 106 meq/L (ref 98–109)
CO2: 22 meq/L (ref 22–29)
Calcium: 9.7 mg/dL (ref 8.4–10.4)
Creatinine: 1.4 mg/dL — ABNORMAL HIGH (ref 0.6–1.1)
EGFR: 51 mL/min/{1.73_m2} — AB (ref >90)
Glucose: 121 mg/dl (ref 70–140)
POTASSIUM: 4.3 meq/L (ref 3.5–5.1)
SODIUM: 139 meq/L (ref 136–145)

## 2015-05-14 MED ORDER — HEPARIN SOD (PORK) LOCK FLUSH 100 UNIT/ML IV SOLN
500.0000 [IU] | Freq: Once | INTRAVENOUS | Status: DC
  Filled 2015-05-14: qty 5

## 2015-05-14 MED ORDER — SODIUM CHLORIDE 0.9% FLUSH
10.0000 mL | INTRAVENOUS | Status: DC | PRN
  Administered 2015-05-14: 10 mL via INTRAVENOUS
  Filled 2015-05-14: qty 10

## 2015-05-14 MED ORDER — HEPARIN SOD (PORK) LOCK FLUSH 100 UNIT/ML IV SOLN
500.0000 [IU] | Freq: Once | INTRAVENOUS | Status: AC
  Administered 2015-05-14: 500 [IU] via INTRAVENOUS
  Filled 2015-05-14: qty 5

## 2015-05-14 NOTE — Telephone Encounter (Signed)
ENCOUNTER OPENED IN ERROR

## 2015-05-14 NOTE — Progress Notes (Signed)
[name] here for lab and possible Granix injection.  ANC up to 8.9 from 0.0.  Does not need injection today and she is to stop Cipro.  Return for appointments as scheduled

## 2015-05-15 ENCOUNTER — Telehealth: Payer: Self-pay

## 2015-05-15 ENCOUNTER — Telehealth: Payer: Self-pay | Admitting: Oncology

## 2015-05-15 NOTE — Telephone Encounter (Signed)
-----  Message from Gordy Levan, MD sent at 05/15/2015  7:40 AM EST ----- If she can push po fluids ok to wait until I see her on 2-9. Thank you! ----- Message -----    From: Baruch Merl, RN    Sent: 05/14/2015   9:49 PM      To: Gordy Levan, MD  Vernell Morgans,  Ms. Vreeland did not have orthostatic VS or any VS due to counts ok.  I dont know if you saw the B-Met either.  Creatine a little better at 1.4. Appt. On 05-16-14 to see you at 1100. Do you want her to get IVF prior to 05-17-15? Sorry! Barbaraann Share ----- Message -----    From: Gordy Levan, MD    Sent: 05/12/2015  12:01 PM      To: Baruch Merl, RN  I need to see CBC and BMET while patient is at office on 2-6 Needs RN to follow up constipation and po fluids, needs weight and orthostatic BP and HR checked.  May need IVF 2-6  Message to RN + CL for labs from [facility] if possible.  NOTE orthostatic vitals not entered fully / nursing documentation note not in EMR from 2-3 that I can find. Good to document if still possible.  thanks

## 2015-05-15 NOTE — Telephone Encounter (Signed)
Spoke with [name] and she is feeling better today.  Her bowels are moving better with Dr. Mariana Kaufman recommendations at visit. She is agreeable to trying to take in at least 64oz of fluid daily for the next 2 days.  Reviewed kidney function as noted below Requested her to call the office tomorrow 05-16-15 if having difficulty with nausea or vomiting as sne may need IVF.  Ms. Schoener verbalized understanding and appreciated the call.

## 2015-05-15 NOTE — Telephone Encounter (Signed)
Left message for patient in regards to appointment time change 2/9.

## 2015-05-16 ENCOUNTER — Other Ambulatory Visit: Payer: Self-pay | Admitting: Oncology

## 2015-05-17 ENCOUNTER — Ambulatory Visit (HOSPITAL_BASED_OUTPATIENT_CLINIC_OR_DEPARTMENT_OTHER): Payer: BLUE CROSS/BLUE SHIELD

## 2015-05-17 ENCOUNTER — Encounter: Payer: Self-pay | Admitting: Oncology

## 2015-05-17 ENCOUNTER — Ambulatory Visit (HOSPITAL_BASED_OUTPATIENT_CLINIC_OR_DEPARTMENT_OTHER): Payer: BLUE CROSS/BLUE SHIELD | Admitting: Oncology

## 2015-05-17 ENCOUNTER — Other Ambulatory Visit: Payer: BLUE CROSS/BLUE SHIELD

## 2015-05-17 ENCOUNTER — Telehealth: Payer: Self-pay

## 2015-05-17 ENCOUNTER — Telehealth: Payer: Self-pay | Admitting: Oncology

## 2015-05-17 VITALS — BP 142/68 | HR 94 | Temp 97.6°F | Resp 18 | Ht 65.0 in | Wt 354.9 lb

## 2015-05-17 DIAGNOSIS — N183 Chronic kidney disease, stage 3 unspecified: Secondary | ICD-10-CM

## 2015-05-17 DIAGNOSIS — D701 Agranulocytosis secondary to cancer chemotherapy: Secondary | ICD-10-CM

## 2015-05-17 DIAGNOSIS — G622 Polyneuropathy due to other toxic agents: Secondary | ICD-10-CM

## 2015-05-17 DIAGNOSIS — D6959 Other secondary thrombocytopenia: Secondary | ICD-10-CM

## 2015-05-17 DIAGNOSIS — C541 Malignant neoplasm of endometrium: Secondary | ICD-10-CM | POA: Diagnosis not present

## 2015-05-17 DIAGNOSIS — K5909 Other constipation: Secondary | ICD-10-CM

## 2015-05-17 DIAGNOSIS — Z95828 Presence of other vascular implants and grafts: Secondary | ICD-10-CM

## 2015-05-17 DIAGNOSIS — D509 Iron deficiency anemia, unspecified: Secondary | ICD-10-CM

## 2015-05-17 DIAGNOSIS — C561 Malignant neoplasm of right ovary: Secondary | ICD-10-CM

## 2015-05-17 DIAGNOSIS — T451X5A Adverse effect of antineoplastic and immunosuppressive drugs, initial encounter: Secondary | ICD-10-CM

## 2015-05-17 DIAGNOSIS — C562 Malignant neoplasm of left ovary: Secondary | ICD-10-CM

## 2015-05-17 DIAGNOSIS — K5900 Constipation, unspecified: Secondary | ICD-10-CM

## 2015-05-17 DIAGNOSIS — K219 Gastro-esophageal reflux disease without esophagitis: Secondary | ICD-10-CM

## 2015-05-17 LAB — CBC WITH DIFFERENTIAL/PLATELET
BASO%: 0.3 % (ref 0.0–2.0)
BASOS ABS: 0 10*3/uL (ref 0.0–0.1)
EOS ABS: 0 10*3/uL (ref 0.0–0.5)
EOS%: 0.7 % (ref 0.0–7.0)
HCT: 30.9 % — ABNORMAL LOW (ref 34.8–46.6)
HGB: 10 g/dL — ABNORMAL LOW (ref 11.6–15.9)
LYMPH%: 33.5 % (ref 14.0–49.7)
MCH: 27.9 pg (ref 25.1–34.0)
MCHC: 32.4 g/dL (ref 31.5–36.0)
MCV: 86 fL (ref 79.5–101.0)
MONO#: 0.5 10*3/uL (ref 0.1–0.9)
MONO%: 9.6 % (ref 0.0–14.0)
NEUT#: 3.1 10*3/uL (ref 1.5–6.5)
NEUT%: 55.9 % (ref 38.4–76.8)
Platelets: 167 10*3/uL (ref 145–400)
RBC: 3.59 10*6/uL — ABNORMAL LOW (ref 3.70–5.45)
RDW: 17.1 % — ABNORMAL HIGH (ref 11.2–14.5)
WBC: 5.5 10*3/uL (ref 3.9–10.3)
lymph#: 1.8 10*3/uL (ref 0.9–3.3)

## 2015-05-17 LAB — COMPREHENSIVE METABOLIC PANEL
ALBUMIN: 3.3 g/dL — AB (ref 3.5–5.0)
ALK PHOS: 77 U/L (ref 40–150)
ALT: 11 U/L (ref 0–55)
AST: 13 U/L (ref 5–34)
Anion Gap: 10 mEq/L (ref 3–11)
BUN: 16.5 mg/dL (ref 7.0–26.0)
CALCIUM: 9.2 mg/dL (ref 8.4–10.4)
CO2: 24 mEq/L (ref 22–29)
CREATININE: 1.2 mg/dL — AB (ref 0.6–1.1)
Chloride: 106 mEq/L (ref 98–109)
EGFR: 61 mL/min/{1.73_m2} — ABNORMAL LOW (ref >90)
Glucose: 140 mg/dl (ref 70–140)
POTASSIUM: 4 meq/L (ref 3.5–5.1)
Sodium: 140 mEq/L (ref 136–145)
Total Bilirubin: <0.3 mg/dL (ref 0.20–1.20)
Total Protein: 6.7 g/dL (ref 6.4–8.3)

## 2015-05-17 MED ORDER — GABAPENTIN 100 MG PO CAPS: 100.0000 mg | ORAL_CAPSULE | Freq: Every day | ORAL | Status: DC

## 2015-05-17 MED FILL — GABAPENTIN 100 MG CAPSULE: 100 | 30 days supply | Qty: 30 | Fill #0

## 2015-05-17 NOTE — Telephone Encounter (Signed)
Printed scheduled avs report, patient is aware Central scheduling will contact to schedule scan

## 2015-05-17 NOTE — Progress Notes (Signed)
OFFICE PROGRESS NOTE   May 17, 2015   Physicians:Emma Rexford Maus, Kayleen Memos, MD (PCP),   INTERVAL HISTORY:  Patient is seen, alone for visit, in continuing attention to adjuvant chemotherapy in process for IIB carcinosarcoma of right ovary, post radical debulking including portion of small bowel 03-13-15. She was neutropenic with ANC 0.1 on day 12 cycle 1, despite carbo capped at 750 mg rather than using actual weight. She had cycle 2 on 05-03-15 at same doses, began granix day 6 (due to severity of taxol aches) and was neutropenic with 0.2 on 05-10-15 and 0 on 05-11-15. [place] recovered to 8.9 on 05-12-15.   Taxol aches began day 2 and lasted for ~ 3 days. Aches with gCSF were not as severe as with taxol.  Patient has felt much better for past 3 days, with no pain and improved energy. She has tingling in feet which is not as bad when she is up during day but bothersome at night; neuropathy is not as significant in hands. She had no bowel movement x 6-7 days after cycle 2 chemo, finally moved with addition of lactulose to bid miralax and colace. Bowels are moving well now with no medication. She is drinking 5-6 bottles of water daily, likely also helping bowels. Taste is unpleasant or absent for everything she has tried except vinagrette dressing (using on salads and chicken) and peaches. No increased SOB. Still having difficult time sleeping, preceded problems with neuropathy. No fever or symptoms of infection. No LE swelling. No bleeding. No abdominal or pelvic pain.  Remainder of 10 point Review of Systems negative.      PAC by IR 04-11-15 Flu vaccine 12-12-14     ONCOLOGIC HISTORY Patient had intermittent right lower quadrant abdominal pain since Aug 2016, with pelvic US 11-30-14 with uterine fibroids (11.2 x 4.8 x 6.8 cm. 1.7 x 1.7 x 2.0 cm subserosal fibroid in the right uterine body. 6.3 x 3.6 x 5.7 cm probable exophytic/pedunculated fibroid along the anterior uterine fundus), endometrium  poorly visualized, right ovary poorly visualized but measured 4 x 2.8 x 4.4 cm and left ovary 4.9 x 3.4 x 4.9 cm with probable corpus luteal cyst called. Note patient is morbidly obese, with BMI >60. She had some fevers during that time. Patient was admitted to [facility] 02-18-15 thru 02-21-15 with pyelonephritis and acute renal failure (creatinine 12.4), both of those problems resolving entirely with treatment. CT AP and MRI pelvis done to evaluate the pyelonephritis demonstrated cystic and solid pelvic mass appearing to be arising from the right adnexa 13 x 9 x 17.5 cm, with a second cystic and solid lesion within the left adnexa measuring 4.4 x 6.5 x 5.3 cm. A third cystic and solid mass was seen anterior to the uterus and superior to the bladder measuring 4.2 x 5.4 x 5.3 cm. She had no pelvic lymphadenopathy, no carcinomatosis, and CA 125 was normal at 21 U/mL. She was seen in consultation by Dr Denman George, with exploratory laparotomy 03-23-15 with TAH BSO, partial small bowel resection and radical debulking, with additional ventral hernia repair. She was hospitalized with the surgery from 12-6 thru 03-16-15, discharged on prophylactic lovenox which she continues, no post op complications in hospital. Jodell Cipro were removed on 03-27-15, some separation of wound with wound care continuing by patient at home. Surgical pathology [id number]) was reviewed at [facility], with final diagnosis of IIB carcinosarcoma of right ovary with involvement of left ovary and uterine serosa, and IA grade 1 endometrioid adenocarcinoma of  uterus. She saw Dr Denman George 03-29-15, with recommendation for adjuvant chemotherapy and follow up with gyn oncology expected after 6 cycles of chemotherapy. Cycle 1 carbo taxol given 04-12-15, carbo capped at 750 mg and taxol dosed at 175 mg/m2 using actual weight. She was neutropenic with ANCX 0.1 on day 12 cycle 1. Doses same for cycle 2, granix begun day 6, ANC 0.2 on day 8.   Objective:  Vital  signs in last 24 hours:  BP 142/68 mmHg  Pulse 94  Temp(Src) 97.6 F (36.4 C) (Oral)  Resp 18  Ht '5\' 5"'  (1.651 m)  Wt 354 lb 14.4 oz (160.982 kg)  BMI 59.06 kg/m2  SpO2 100%  LMP 10/16/2013 (Approximate) Weight up 14 lbs from 05-10-15. Looks comfortable today, much more energetic, very pleasant as always. Alert, oriented and appropriate. Ambulatory with some difficulty due to morbid obesity.  Alopecia  HEENT:PERRL, sclerae not icteric. Oral mucosa moist without lesions, posterior pharynx clear.  Neck supple. No JVD.  Lymphatics:no cervical,supraclavicular adenopathy Resp: clear to auscultation bilaterally and normal percussion bilaterally Cardio: regular rate and rhythm. No gallop. GI: abdomen obese, soft, nontender, not distended, cannot appreciate mass or organomegaly. Normal bowel sounds. Surgical incision closed, not tender. Musculoskeletal/ Extremities: without pitting edema, cords, tenderness Neuro: slight peripheral neuropathy feet as noted. Otherwise nonfocal. PSYCH appropriate mood and affect Skin without rash, ecchymosis, petechiae Portacath-without erythema or tenderness  Lab Results:  Results for orders placed or performed in visit on 05/17/15  CBC with Differential  Result Value Ref Range   WBC 5.5 3.9 - 10.3 10e3/uL   NEUT# 3.1 1.5 - 6.5 10e3/uL   HGB 10.0 (L) 11.6 - 15.9 g/dL   HCT 30.9 (L) 34.8 - 46.6 %   Platelets 167 145 - 400 10e3/uL   MCV 86.0 79.5 - 101.0 fL   MCH 27.9 25.1 - 34.0 pg   MCHC 32.4 31.5 - 36.0 g/dL   RBC 3.59 (L) 3.70 - 5.45 10e6/uL   RDW 17.1 (H) 11.2 - 14.5 %   lymph# 1.8 0.9 - 3.3 10e3/uL   MONO# 0.5 0.1 - 0.9 10e3/uL   Eosinophils Absolute 0.0 0.0 - 0.5 10e3/uL   Basophils Absolute 0.0 0.0 - 0.1 10e3/uL   NEUT% 55.9 38.4 - 76.8 %   LYMPH% 33.5 14.0 - 49.7 %   MONO% 9.6 0.0 - 14.0 %   EOS% 0.7 0.0 - 7.0 %   BASO% 0.3 0.0 - 2.0 %  Comprehensive metabolic panel  Result Value Ref Range   Sodium 140 136 - 145 mEq/L   Potassium 4.0  3.5 - 5.1 mEq/L   Chloride 106 98 - 109 mEq/L   CO2 24 22 - 29 mEq/L   Glucose 140 70 - 140 mg/dl   BUN 16.5 7.0 - 26.0 mg/dL   Creatinine 1.2 (H) 0.6 - 1.1 mg/dL   Total Bilirubin <0.30 0.20 - 1.20 mg/dL   Alkaline Phosphatase 77 40 - 150 U/L   AST 13 5 - 34 U/L   ALT 11 0 - 55 U/L   Total Protein 6.7 6.4 - 8.3 g/dL   Albumin 3.3 (L) 3.5 - 5.0 g/dL   Calcium 9.2 8.4 - 10.4 mg/dL   Anion Gap 10 3 - 11 mEq/L   EGFR 61 (L) >90 ml/min/1.73 m2     Studies/Results:  No results found.  Medications: I have reviewed the patient's current medications. Add gabapentin 100 mg at hs for neuropathy and as it may help sleep, will increase if needed.  Written and oral instructions: Begin miralax and colace day before chemo and use at least 2 times daily starting day of chemo until bowels moving well  Add lactulose (sorbital) 30 cc = 1 tablespoon daily or 2x daily starting day after chemo Call if >=48 hours without bowel movement  Will give neulasta on day 6 cycle 3 instead of granix, if insurance allows.   DISCUSSION Meds as above. Discussed lots of exercise and massage for feet also. Counts good to do errands including getting wig. Discussed taste problems, dietician also to see when she is here for chemo on 2-16. Discussed trying neulasta instead of granix, as as she needed 5 doses granix to recover counts cycle 2. Will give the neulasta on day 6; may cause more aches but hopefully will improve counts more effectively than granix.   As PAC had flipped and was repositioned on 05-10-15, recommendation is to do dye study to be sure catheter is intact prior to next chemo - ordered with code [id number] per radiology now.  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 Pathology reviewed at [facility]. Taxol carboplatin begun 04-12-15, neutropenic by day 12 cycle  1, rapid increase in counts with 2 doses granix then. Neutropenic with ANC 0 despite granix cycle 2, fortunately recovered without infection. Will try neulasta if insurance allows, give no later than day 6 (due to severity of taxol aches). WIll not increase carboplatin above capped dose of 750 mg. 2.synchronous IA grade 1 endometrioid adenocarcinoma of uterus, treated surgically 3. PAC flipped 05-10-15,  manipulated back into correct position for access by RN. Dye study ordered now to be sure catheter intact.  Peripheral IV access very difficult 4.morbid obesity, BMI 62. Cycles 1 and 2 carbo capped at 750 mg per Gratz protocol, but will need to decrease for subsequent cycles. 5.chemo peripheral neuropathy: lots of movement and exercise, add gabapentin at hs. Not interfering with function and, with potential benefit from taxol at this point, we are in agreement with continuing chemo and following  6.uncomfortable constipation x days despite increasing laxatives starting day prior to cycle 2 chemo: resolved with addition of lactulose to miralax and colace. Plan as above, beginning day prior to chemo. 7.acute renal failure with creatinine 12.4 at presentation with pyelonephritis, resolved. Creatinine better today, Much better hydrated/ better po intake fluids now. Dosing carbo as noted 8.overdue mammograms, which we will address when possible. 9.perforated diverticular disease with temporary colostomy 2008.  10..flu vaccine 12-12-14 11. no DVT by LE dopplers after 03-29-15. Completed post op prophylactic lovenox LE swelling improved.. [email or web address] iron deficiency anemia: tolerating oral ferrous fumarate, continue. f/u colonoscopy information 13.history of PCN allergy, which was not an anaphylactic reaction.  14.financial concerns: appreciate assistance from [facility]  15.increased GERD: now on protonix 16.chemo thrombocytopenia also improved 17.morbid obesity: weight up 14 lbs, at least in part with better  hydration. Appreciate help upcoming from nutritionist   All questions answered. Chemo orders confirmed. Neulasta entered, message to managed care. Time spent 30 min including >50% counseling and coordination of care.    LIVESAY,LENNIS P, MD   05/17/2015, 11:14 AM

## 2015-05-17 NOTE — Telephone Encounter (Signed)
-----   Message from Gordy Levan, MD sent at 05/17/2015 11:44 AM EST ----- Gabapentin 100 mg at hs for neuropathy in feet. Will make drowsy #30   1 RF

## 2015-05-17 NOTE — Patient Instructions (Signed)
Begin miralax and colace day before chemo and use at least 2 times daily starting day of chemo until bowels moving well  Add lactulose (sorbital) 30 cc = 1 tablespoon daily or 2x daily starting day after chemo Call if >=48 hours without bowel movement

## 2015-05-17 NOTE — Telephone Encounter (Signed)
S/w pt re e-scribed gabapentin

## 2015-05-18 ENCOUNTER — Encounter: Payer: Self-pay | Admitting: *Deleted

## 2015-05-18 NOTE — Progress Notes (Signed)
On 05/10/15 i palpated patient's port which moved easily.  was able to palpate the 3 distinct raised areas on the septum. Port was accessible. Kathy buyck rn notified.

## 2015-05-19 ENCOUNTER — Other Ambulatory Visit: Payer: Self-pay | Admitting: Oncology

## 2015-05-21 ENCOUNTER — Other Ambulatory Visit: Payer: Self-pay | Admitting: Oncology

## 2015-05-21 ENCOUNTER — Telehealth: Payer: Self-pay | Admitting: Oncology

## 2015-05-21 DIAGNOSIS — C541 Malignant neoplasm of endometrium: Secondary | ICD-10-CM

## 2015-05-21 NOTE — Telephone Encounter (Signed)
Spoke with patient re appointments for 2/21, 2/23 and 3/6. Patient to get new schedule 2/16. Per 2/11 pof inj should be 2/21 not 2/22. Also per 2/9 pof request for an injection via PAC in IR - originally spoke with desk nurse to clarify, however after speaking with patient she is awaiting port placement. No order available for port placment - left message for desk nurse informing her.

## 2015-05-22 ENCOUNTER — Telehealth: Payer: Self-pay

## 2015-05-22 NOTE — Telephone Encounter (Signed)
Told [name] the she is scheduled at [facility] radiology in IR for a dye study of the [name] prior to her treatment 05-24-15 to make sure line is patent and not kinked since it had flipped 05-10-15 and manually returned to proper position per Dr. Marko Plume.  Ms. Gruber verbalized understanding.

## 2015-05-24 ENCOUNTER — Other Ambulatory Visit: Payer: Self-pay | Admitting: Oncology

## 2015-05-24 ENCOUNTER — Ambulatory Visit: Payer: BLUE CROSS/BLUE SHIELD

## 2015-05-24 ENCOUNTER — Ambulatory Visit (HOSPITAL_BASED_OUTPATIENT_CLINIC_OR_DEPARTMENT_OTHER): Payer: BLUE CROSS/BLUE SHIELD

## 2015-05-24 ENCOUNTER — Other Ambulatory Visit (HOSPITAL_BASED_OUTPATIENT_CLINIC_OR_DEPARTMENT_OTHER): Payer: BLUE CROSS/BLUE SHIELD

## 2015-05-24 ENCOUNTER — Ambulatory Visit (HOSPITAL_COMMUNITY)
Admission: RE | Admit: 2015-05-24 | Discharge: 2015-05-24 | Disposition: A | Discharge status: Home / Self Care | Payer: BLUE CROSS/BLUE SHIELD | Source: Ambulatory Visit | Attending: Oncology | Admitting: Oncology

## 2015-05-24 ENCOUNTER — Ambulatory Visit: Payer: BLUE CROSS/BLUE SHIELD | Admitting: Nutrition

## 2015-05-24 VITALS — BP 140/72 | HR 82 | Temp 96.8°F | Resp 19

## 2015-05-24 DIAGNOSIS — Z5111 Encounter for antineoplastic chemotherapy: Secondary | ICD-10-CM | POA: Diagnosis not present

## 2015-05-24 DIAGNOSIS — C561 Malignant neoplasm of right ovary: Secondary | ICD-10-CM

## 2015-05-24 DIAGNOSIS — Z95828 Presence of other vascular implants and grafts: Secondary | ICD-10-CM

## 2015-05-24 DIAGNOSIS — C541 Malignant neoplasm of endometrium: Secondary | ICD-10-CM

## 2015-05-24 DIAGNOSIS — Z452 Encounter for adjustment and management of vascular access device: Secondary | ICD-10-CM | POA: Diagnosis present

## 2015-05-24 LAB — COMPREHENSIVE METABOLIC PANEL
ALBUMIN: 3.3 g/dL — AB (ref 3.5–5.0)
ALK PHOS: 76 U/L (ref 40–150)
ALT: <9 U/L (ref 0–55)
AST: 11 U/L (ref 5–34)
Anion Gap: 10 mEq/L (ref 3–11)
BUN: 20.9 mg/dL (ref 7.0–26.0)
CO2: 22 meq/L (ref 22–29)
Calcium: 10.1 mg/dL (ref 8.4–10.4)
Chloride: 109 mEq/L (ref 98–109)
Creatinine: 1 mg/dL (ref 0.6–1.1)
EGFR: 74 mL/min/{1.73_m2} — AB (ref >90)
GLUCOSE: 161 mg/dL — AB (ref 70–140)
POTASSIUM: 4.4 meq/L (ref 3.5–5.1)
SODIUM: 140 meq/L (ref 136–145)
TOTAL PROTEIN: 7.2 g/dL (ref 6.4–8.3)

## 2015-05-24 LAB — CBC WITH DIFFERENTIAL/PLATELET
BASO%: 0 % (ref 0.0–2.0)
Basophils Absolute: 0 10*3/uL (ref 0.0–0.1)
EOS%: 0 % (ref 0.0–7.0)
Eosinophils Absolute: 0 10*3/uL (ref 0.0–0.5)
HCT: 31.4 % — ABNORMAL LOW (ref 34.8–46.6)
HEMOGLOBIN: 10.4 g/dL — AB (ref 11.6–15.9)
LYMPH%: 13 % — AB (ref 14.0–49.7)
MCH: 28.7 pg (ref 25.1–34.0)
MCHC: 33.1 g/dL (ref 31.5–36.0)
MCV: 86.7 fL (ref 79.5–101.0)
MONO#: 0 10*3/uL — AB (ref 0.1–0.9)
MONO%: 0.5 % (ref 0.0–14.0)
NEUT%: 86.5 % — ABNORMAL HIGH (ref 38.4–76.8)
NEUTROS ABS: 5.5 10*3/uL (ref 1.5–6.5)
Platelets: 218 10*3/uL (ref 145–400)
RBC: 3.62 10*6/uL — AB (ref 3.70–5.45)
RDW: 17.3 % — AB (ref 11.2–14.5)
WBC: 6.3 10*3/uL (ref 3.9–10.3)
lymph#: 0.8 10*3/uL — ABNORMAL LOW (ref 0.9–3.3)

## 2015-05-24 MED ORDER — SODIUM CHLORIDE 0.9 % IV SOLN
Freq: Once | INTRAVENOUS | Status: AC
  Administered 2015-05-24: 13:00:00 via INTRAVENOUS

## 2015-05-24 MED ORDER — DIPHENHYDRAMINE HCL 50 MG/ML IJ SOLN
50.0000 mg | Freq: Once | INTRAMUSCULAR | Status: AC
  Administered 2015-05-24: 50 mg via INTRAVENOUS

## 2015-05-24 MED ORDER — IOHEXOL 300 MG/ML  SOLN
50.0000 mL | Freq: Once | INTRAMUSCULAR | Status: AC | PRN
  Administered 2015-05-24: 10 mL via INTRAVENOUS

## 2015-05-24 MED ORDER — SODIUM CHLORIDE 0.9 % IV SOLN
Freq: Once | INTRAVENOUS | Status: AC
  Administered 2015-05-24: 14:00:00 via INTRAVENOUS
  Filled 2015-05-24: qty 8

## 2015-05-24 MED ORDER — HEPARIN SOD (PORK) LOCK FLUSH 100 UNIT/ML IV SOLN
500.0000 [IU] | Freq: Once | INTRAVENOUS | Status: AC | PRN
  Administered 2015-05-24: 500 [IU]
  Filled 2015-05-24: qty 5

## 2015-05-24 MED ORDER — DIPHENHYDRAMINE HCL 50 MG/ML IJ SOLN
INTRAMUSCULAR | Status: AC
  Filled 2015-05-24: qty 1

## 2015-05-24 MED ORDER — PACLITAXEL CHEMO INJECTION 300 MG/50ML
175.0000 mg/m2 | Freq: Once | INTRAVENOUS | Status: AC
  Administered 2015-05-24: 492 mg via INTRAVENOUS
  Filled 2015-05-24: qty 82

## 2015-05-24 MED ORDER — FAMOTIDINE IN NACL 20-0.9 MG/50ML-% IV SOLN
20.0000 mg | Freq: Once | INTRAVENOUS | Status: AC
  Administered 2015-05-24: 20 mg via INTRAVENOUS

## 2015-05-24 MED ORDER — SODIUM CHLORIDE 0.9 % IJ SOLN
10.0000 mL | INTRAMUSCULAR | Status: DC | PRN
  Administered 2015-05-24: 10 mL
  Filled 2015-05-24: qty 10

## 2015-05-24 MED ORDER — FAMOTIDINE IN NACL 20-0.9 MG/50ML-% IV SOLN
INTRAVENOUS | Status: AC
  Filled 2015-05-24: qty 50

## 2015-05-24 MED ORDER — SODIUM CHLORIDE 0.9% FLUSH
10.0000 mL | INTRAVENOUS | Status: DC | PRN
  Administered 2015-05-24: 10 mL via INTRAVENOUS
  Filled 2015-05-24: qty 10

## 2015-05-24 MED ORDER — SODIUM CHLORIDE 0.9 % IV SOLN
750.0000 mg | Freq: Once | INTRAVENOUS | Status: AC
  Administered 2015-05-24: 750 mg via INTRAVENOUS
  Filled 2015-05-24: qty 75

## 2015-05-24 NOTE — Progress Notes (Signed)
50 year old female diagnosed with ovarian cancer.  She is a patient of Dr. Marko Plume.  Past medical history includes hypertension, migraines, IBS, diverticulitis with perforation, kidney infection, GERD, morbid obesity, and anemia.  Medications include Decadron, Colace, lactulose, Ativan, Zofran, and Protonix.  Labs include albumin 3.3 on February 9.  Height: 65 inches. Weight: 354.9 pounds. Usual body weight: 375 pounds November 2016. BMI: 59.06.  Patient mainly complains of taste alterations. Reports she does not have vomiting or nausea.  Denies constipation and diarrhea.  Reports increased fluid intake and dehydration has resolved. Is trying to eat frequently throughout the day. Patient has last 5% of usual body weight over 3 months.  Nutrition diagnosis: Unintended weight loss related to ovarian cancer and associated treatments as evidenced by 5% weight loss over 3 months.  Intervention: I educated patient to consume healthy plant-based diet with adequate protein in small frequent meals and snacks. I encouraged patient to strive for slow, safe weight loss by choosing a healthy foods. Educated patient on strategies for improving taste alterations and provided fact sheets. Questions were answered.  Teach back method used.  Contact information was provided.  Monitoring, evaluation, goals: Patient will tolerate adequate calories and protein to maintain lean body mass.  Next visit: I will schedule patient as needed.  She has my contact information if she develops further problems or questions.  **Disclaimer: This note was dictated with voice recognition software. Similar sounding words can inadvertently be transcribed and this note may contain transcription errors which may not have been corrected upon publication of note.**

## 2015-05-24 NOTE — Patient Instructions (Signed)
[facility] Discharge Instructions for Patients Receiving Chemotherapy  Today you received the following chemotherapy agents: Taxol, Carboplatin  To help prevent nausea and vomiting after your treatment, we encourage you to take your nausea medication as prescribed by your physician.   If you develop nausea and vomiting that is not controlled by your nausea medication, call the clinic.   BELOW ARE SYMPTOMS THAT SHOULD BE REPORTED IMMEDIATELY:  *FEVER GREATER THAN 100.5 F  *CHILLS WITH OR WITHOUT FEVER  NAUSEA AND VOMITING THAT IS NOT CONTROLLED WITH YOUR NAUSEA MEDICATION  *UNUSUAL SHORTNESS OF BREATH  *UNUSUAL BRUISING OR BLEEDING  TENDERNESS IN MOUTH AND THROAT WITH OR WITHOUT PRESENCE OF ULCERS  *URINARY PROBLEMS  *BOWEL PROBLEMS  UNUSUAL RASH Items with * indicate a potential emergency and should be followed up as soon as possible.  Feel free to call the clinic you have any questions or concerns. The clinic phone number is [phone].  Please show the CHEMO ALERT CARD at check-in to the Emergency Department and triage nurse.   

## 2015-05-24 NOTE — Progress Notes (Signed)
Official radiology results has not shown up in dye study; Per [facility], per Dr. Pascal Lux: right PAC okay to use with tip in superior CAJ. Port flushes very well with brisk blood return.

## 2015-05-25 ENCOUNTER — Telehealth: Payer: Self-pay | Admitting: *Deleted

## 2015-05-25 NOTE — Telephone Encounter (Signed)
Per patient request, I have moved the appt on 3/9 to earlier. I have called and left her a message. Message left to call back to confirm.

## 2015-05-27 ENCOUNTER — Other Ambulatory Visit: Payer: Self-pay | Admitting: Oncology

## 2015-05-27 DIAGNOSIS — C561 Malignant neoplasm of right ovary: Secondary | ICD-10-CM

## 2015-05-29 ENCOUNTER — Ambulatory Visit (HOSPITAL_BASED_OUTPATIENT_CLINIC_OR_DEPARTMENT_OTHER): Payer: BLUE CROSS/BLUE SHIELD

## 2015-05-29 ENCOUNTER — Other Ambulatory Visit: Payer: Self-pay | Admitting: Oncology

## 2015-05-29 VITALS — BP 114/74 | HR 104 | Temp 98.6°F

## 2015-05-29 DIAGNOSIS — D701 Agranulocytosis secondary to cancer chemotherapy: Secondary | ICD-10-CM | POA: Diagnosis not present

## 2015-05-29 DIAGNOSIS — T451X5A Adverse effect of antineoplastic and immunosuppressive drugs, initial encounter: Principal | ICD-10-CM

## 2015-05-29 MED ORDER — PEGFILGRASTIM INJECTION 6 MG/0.6ML ~~LOC~~
6.0000 mg | PREFILLED_SYRINGE | Freq: Once | SUBCUTANEOUS | Status: AC
  Administered 2015-05-29: 6 mg via SUBCUTANEOUS
  Filled 2015-05-29: qty 0.6

## 2015-05-31 ENCOUNTER — Other Ambulatory Visit: Payer: Self-pay | Admitting: *Deleted

## 2015-05-31 ENCOUNTER — Other Ambulatory Visit (HOSPITAL_BASED_OUTPATIENT_CLINIC_OR_DEPARTMENT_OTHER): Payer: BLUE CROSS/BLUE SHIELD

## 2015-05-31 ENCOUNTER — Ambulatory Visit: Payer: BLUE CROSS/BLUE SHIELD

## 2015-05-31 ENCOUNTER — Ambulatory Visit (HOSPITAL_BASED_OUTPATIENT_CLINIC_OR_DEPARTMENT_OTHER): Payer: BLUE CROSS/BLUE SHIELD | Admitting: Oncology

## 2015-05-31 ENCOUNTER — Ambulatory Visit (HOSPITAL_BASED_OUTPATIENT_CLINIC_OR_DEPARTMENT_OTHER): Payer: BLUE CROSS/BLUE SHIELD

## 2015-05-31 ENCOUNTER — Encounter: Payer: Self-pay | Admitting: Oncology

## 2015-05-31 VITALS — BP 125/68 | HR 81 | Temp 98.7°F | Resp 20

## 2015-05-31 VITALS — BP 119/69 | HR 103 | Temp 98.5°F | Resp 20 | Wt 340.0 lb

## 2015-05-31 DIAGNOSIS — D701 Agranulocytosis secondary to cancer chemotherapy: Secondary | ICD-10-CM

## 2015-05-31 DIAGNOSIS — C562 Malignant neoplasm of left ovary: Secondary | ICD-10-CM

## 2015-05-31 DIAGNOSIS — Z95828 Presence of other vascular implants and grafts: Secondary | ICD-10-CM

## 2015-05-31 DIAGNOSIS — N179 Acute kidney failure, unspecified: Secondary | ICD-10-CM

## 2015-05-31 DIAGNOSIS — C541 Malignant neoplasm of endometrium: Secondary | ICD-10-CM | POA: Diagnosis not present

## 2015-05-31 DIAGNOSIS — N183 Chronic kidney disease, stage 3 unspecified: Secondary | ICD-10-CM

## 2015-05-31 DIAGNOSIS — T451X5A Adverse effect of antineoplastic and immunosuppressive drugs, initial encounter: Secondary | ICD-10-CM

## 2015-05-31 DIAGNOSIS — C569 Malignant neoplasm of unspecified ovary: Secondary | ICD-10-CM

## 2015-05-31 DIAGNOSIS — G622 Polyneuropathy due to other toxic agents: Secondary | ICD-10-CM

## 2015-05-31 DIAGNOSIS — C561 Malignant neoplasm of right ovary: Secondary | ICD-10-CM

## 2015-05-31 DIAGNOSIS — E860 Dehydration: Secondary | ICD-10-CM

## 2015-05-31 DIAGNOSIS — K219 Gastro-esophageal reflux disease without esophagitis: Secondary | ICD-10-CM

## 2015-05-31 DIAGNOSIS — K5909 Other constipation: Secondary | ICD-10-CM

## 2015-05-31 DIAGNOSIS — D509 Iron deficiency anemia, unspecified: Secondary | ICD-10-CM

## 2015-05-31 DIAGNOSIS — D6959 Other secondary thrombocytopenia: Secondary | ICD-10-CM

## 2015-05-31 LAB — CBC WITH DIFFERENTIAL/PLATELET
BASO%: 0.7 % (ref 0.0–2.0)
BASOS ABS: 0 10*3/uL (ref 0.0–0.1)
EOS ABS: 0 10*3/uL (ref 0.0–0.5)
EOS%: 1.5 % (ref 0.0–7.0)
HCT: 34.4 % — ABNORMAL LOW (ref 34.8–46.6)
HGB: 11.2 g/dL — ABNORMAL LOW (ref 11.6–15.9)
LYMPH%: 89.7 % — ABNORMAL HIGH (ref 14.0–49.7)
MCH: 28.8 pg (ref 25.1–34.0)
MCHC: 32.6 g/dL (ref 31.5–36.0)
MCV: 88.3 fL (ref 79.5–101.0)
MONO#: 0.1 10*3/uL (ref 0.1–0.9)
MONO%: 3.4 % (ref 0.0–14.0)
NEUT#: 0.1 10*3/uL — CL (ref 1.5–6.5)
NEUT%: 4.7 % — AB (ref 38.4–76.8)
Platelets: 195 10*3/uL (ref 145–400)
RBC: 3.9 10*6/uL (ref 3.70–5.45)
RDW: 18.6 % — ABNORMAL HIGH (ref 11.2–14.5)
WBC: 1.8 10*3/uL — ABNORMAL LOW (ref 3.9–10.3)
lymph#: 1.7 10*3/uL (ref 0.9–3.3)

## 2015-05-31 LAB — COMPREHENSIVE METABOLIC PANEL
ALBUMIN: 3.6 g/dL (ref 3.5–5.0)
ALK PHOS: 71 U/L (ref 40–150)
ALT: 12 U/L (ref 0–55)
AST: 15 U/L (ref 5–34)
Anion Gap: 11 mEq/L (ref 3–11)
BUN: 18.5 mg/dL (ref 7.0–26.0)
CALCIUM: 10.1 mg/dL (ref 8.4–10.4)
CO2: 23 mEq/L (ref 22–29)
CREATININE: 1.2 mg/dL — AB (ref 0.6–1.1)
Chloride: 103 mEq/L (ref 98–109)
EGFR: 60 mL/min/{1.73_m2} — ABNORMAL LOW (ref >90)
GLUCOSE: 133 mg/dL (ref 70–140)
POTASSIUM: 4.5 meq/L (ref 3.5–5.1)
SODIUM: 137 meq/L (ref 136–145)
TOTAL PROTEIN: 7.3 g/dL (ref 6.4–8.3)
Total Bilirubin: 0.49 mg/dL (ref 0.20–1.20)

## 2015-05-31 MED ORDER — HEPARIN SOD (PORK) LOCK FLUSH 100 UNIT/ML IV SOLN
500.0000 [IU] | Freq: Once | INTRAVENOUS | Status: AC
  Administered 2015-05-31: 500 [IU] via INTRAVENOUS
  Filled 2015-05-31: qty 5

## 2015-05-31 MED ORDER — ONDANSETRON HCL 40 MG/20ML IJ SOLN
Freq: Once | INTRAMUSCULAR | Status: AC
  Administered 2015-05-31: 11:00:00 via INTRAVENOUS
  Filled 2015-05-31: qty 4

## 2015-05-31 MED ORDER — OXYCODONE HCL 5 MG PO TABS: 5.0000 mg | ORAL_TABLET | ORAL | Status: DC | PRN

## 2015-05-31 MED ORDER — SODIUM CHLORIDE 0.9 % IV SOLN
1000.0000 mL | Freq: Once | INTRAVENOUS | Status: AC
  Administered 2015-05-31: 1000 mL via INTRAVENOUS

## 2015-05-31 MED ORDER — SODIUM CHLORIDE 0.9% FLUSH
10.0000 mL | INTRAVENOUS | Status: DC | PRN
  Administered 2015-05-31: 10 mL via INTRAVENOUS
  Filled 2015-05-31: qty 10

## 2015-05-31 MED ORDER — DEXAMETHASONE 4 MG PO TABS: ORAL_TABLET | ORAL | Status: DC

## 2015-05-31 MED ORDER — CIPROFLOXACIN HCL 250 MG PO TABS: 250.0000 mg | ORAL_TABLET | Freq: Two times a day (BID) | ORAL | Status: DC

## 2015-05-31 MED FILL — oxyCODONE HCL 5 MG TABS: 5 | 3 days supply | Qty: 30 | Fill #0

## 2015-05-31 MED FILL — DEXAMETHASONE 4 MG TABLET: 4 | 2 days supply | Qty: 20 | Fill #0

## 2015-05-31 MED FILL — CIPROFLOXACIN HCL 250 MG TA: 250 | 3 days supply | Qty: 6 | Fill #0

## 2015-05-31 NOTE — Patient Instructions (Addendum)
Stay out of stores, restaurants, church while blood counts are low. Call if temperature >=100.5 or symptoms of infection while blood counts are low.  [phone]  Start antibiotic cipro every 12 hrs for next 3 days, to help prevent fever while blood counts are low.  We will recheck your white count on Monday 2-27; nurse practitioner will see you then.    Increase lactulose/ sorbital to 3 times daily until your bowels are moving well.   Drink lots and lots of fluids

## 2015-05-31 NOTE — Patient Instructions (Signed)

## 2015-05-31 NOTE — Progress Notes (Signed)
OFFICE PROGRESS NOTE   May 31, 2015   Physicians:Emma Rexford Maus, Kayleen Memos, MD (PCP),    INTERVAL HISTORY:  Patient is seen, alone for visit, neutropenic today, day 8 cycle 3,  following carbo taxol on 05-24-15 with neulasta 05-29-15.  This was first neulasta, having used granix with previous cycles, timing due to severity of taxol aches. She is being treated adjuvantly for IIB carcinosarcoma of right ovary, post radical debulking including resection of portion of small bowel 03-13-15. PAC ok by dye study 05-24-15 after manually repositioned.   Patient is feeling very tired, but is not febrile. She has not been drinking fluids adequately, as indicated by weight down 14 lbs, a little lightheaded when stands, and higher creatinine and hemoglobin today. Yesterday she had 1 Boost, some water, some chicken broth; today she had one small glass OJ and sips of water. Bowels moved well with increased lactulose to bid starting day prior to chemo, then no bowel movement until day 6 despite continuing the bid lactulose. She had small bowel movements day 6 and day 7, none so far today. She has had cramping abdominal pain related to the constipation. She was nauseated only night of 2-17 when severe taxol aches began, took antiemetic then as only dose needed. She is voiding. Pain now is mostly in legs, did improve with oxycodone 1 tablet ~ 2x daily and 2 tabs at hs "when I could not stand the pain any longer". She is voiding. She denies increased SOB or chest pain, bleeding, LE swelling. No discomfort at [facility]. Remainder of 10 point Review of Systems negative/ unchanged.   PAC by IR 04-11-15 Flu vaccine 12-12-14  ONCOLOGIC HISTORY Patient had intermittent right lower quadrant abdominal pain since Aug 2016, with pelvic US 11-30-14 with uterine fibroids (11.2 x 4.8 x 6.8 cm. 1.7 x 1.7 x 2.0 cm subserosal fibroid in the right uterine body. 6.3 x 3.6 x 5.7 cm probable exophytic/pedunculated fibroid along the anterior  uterine fundus), endometrium poorly visualized, right ovary poorly visualized but measured 4 x 2.8 x 4.4 cm and left ovary 4.9 x 3.4 x 4.9 cm with probable corpus luteal cyst called. Note patient is morbidly obese, with BMI >60. She had some fevers during that time. Patient was admitted to [facility] 02-18-15 thru 02-21-15 with pyelonephritis and acute renal failure (creatinine 12.4), both of those problems resolving entirely with treatment. CT AP and MRI pelvis done to evaluate the pyelonephritis demonstrated cystic and solid pelvic mass appearing to be arising from the right adnexa 13 x 9 x 17.5 cm, with a second cystic and solid lesion within the left adnexa measuring 4.4 x 6.5 x 5.3 cm. A third cystic and solid mass was seen anterior to the uterus and superior to the bladder measuring 4.2 x 5.4 x 5.3 cm. She had no pelvic lymphadenopathy, no carcinomatosis, and CA 125 was normal at 21 U/mL. She was seen in consultation by Dr Denman George, with exploratory laparotomy 03-23-15 with TAH BSO, partial small bowel resection and radical debulking, with additional ventral hernia repair. She was hospitalized with the surgery from 12-6 thru 03-16-15, discharged on prophylactic lovenox which she continues, no post op complications in hospital. Jodell Cipro were removed on 03-27-15, some separation of wound with wound care continuing by patient at home. 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 She saw Dr Denman George  03-29-15, with recommendation for adjuvant chemotherapy and follow up with gyn oncology expected after 6 cycles of chemotherapy. Cycle 1 carbo taxol given 04-12-15, carbo capped at 750 mg and taxol dosed at 175 mg/m2 using actual weight. She was neutropenic with ANCX 0.1 on day 12 cycle 1. Doses same for cycle 2, granix begun day 6, ANC 0.2 on  day 8.   Objective:  Vital signs in last 24 hours:  BP 119/69 mmHg  Pulse 103  Temp(Src) 98.5 F (36.9 C) (Oral)  Resp 20  Wt 340 lb (154.223 kg)  SpO2 100%  LMP 10/16/2013 (Approximate) BMI 59 Weight down 14 lbs  Alert, oriented and appropriate. Ambulatory, respirations not labored seated in exam room. Looks fatigued but not in acute distress Alopecia  HEENT:PERRL, sclerae not icteric. Oral mucosa somewhat dry without lesions, posterior pharynx clear.  Neck supple. No JVD.  Lymphatics:no cervical,supraclavicular adenopathy Resp: clear to auscultation bilaterally and normal percussion bilaterally Cardio: regular rate and rhythm. No gallop. GI: obese, soft, nontender, not obviously distended, cannot appreciate mass or organomegaly. Normally active bowel sounds. Surgical incision not remarkable. Musculoskeletal/ Extremities: without pitting edema, cords, tenderness Neuro: no significant peripheral neuropathy. Otherwise nonfocal Skin without rash, ecchymosis, petechiae, with candida rash at abdominal folds resolved. Portacath-without erythema or tenderness  Lab Results:  Results for orders placed or performed in visit on 05/31/15  CBC with Differential  Result Value Ref Range   WBC 1.8 (L) 3.9 - 10.3 10e3/uL   NEUT# 0.1 (LL) 1.5 - 6.5 10e3/uL   HGB 11.2 (L) 11.6 - 15.9 g/dL   HCT 34.4 (L) 34.8 - 46.6 %   Platelets 195 145 - 400 10e3/uL   MCV 88.3 79.5 - 101.0 fL   MCH 28.8 25.1 - 34.0 pg   MCHC 32.6 31.5 - 36.0 g/dL   RBC 3.90 3.70 - 5.45 10e6/uL   RDW 18.6 (H) 11.2 - 14.5 %   lymph# 1.7 0.9 - 3.3 10e3/uL   MONO# 0.1 0.1 - 0.9 10e3/uL   Eosinophils Absolute 0.0 0.0 - 0.5 10e3/uL   Basophils Absolute 0.0 0.0 - 0.1 10e3/uL   NEUT% 4.7 (L) 38.4 - 76.8 %   LYMPH% 89.7 (H) 14.0 - 49.7 %   MONO% 3.4 0.0 - 14.0 %   EOS% 1.5 0.0 - 7.0 %   BASO% 0.7 0.0 - 2.0 %  Comprehensive metabolic panel  Result Value Ref Range   Sodium 137 136 - 145 mEq/L   Potassium 4.5 3.5 - 5.1  mEq/L   Chloride 103 98 - 109 mEq/L   CO2 23 22 - 29 mEq/L   Glucose 133 70 - 140 mg/dl   BUN 18.5 7.0 - 26.0 mg/dL   Creatinine 1.2 (H) 0.6 - 1.1 mg/dL   Total Bilirubin 0.49 0.20 - 1.20 mg/dL   Alkaline Phosphatase 71 40 - 150 U/L   AST 15 5 - 34 U/L   ALT 12 0 - 55 U/L   Total Protein 7.3 6.4 - 8.3 g/dL   Albumin 3.6 3.5 - 5.0 g/dL   Calcium 10.1 8.4 - 10.4 mg/dL   Anion Gap 11 3 - 11 mEq/L   EGFR 60 (L) >90 ml/min/1.73 m2     Studies/Results:  No results found.  Medications: I have reviewed the patient's current medications. Increase lactulose to tid. Cipro bid x 3 days due to profound neutropenia Neulasta given 05-29-15, so no additional granix now.  DISCUSSION One liter NS given IV following visit today, with IV zofran.  Meds as above Neutropenic precautions reviewed. She will have repeat counts and see APP on 2-27; I will see her with labs 3-6 before cycle 4 chemo on 06-14-15. Will try to move neulasta closer to treatment even with the severe taxol aches.   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 Pathology reviewed at [facility]. Taxol carboplatin begun 04-12-15, neutropenic by day 12 cycle 1, rapid increase in counts with 2 doses granix then. Neutropenic with ANC 0 despite granix cycle 2, fortunately recovered without infection. Neutropenic at 0.1 day 8 cycle 3 today despite neulasta day 6 (due to taxol aches).  Carboplatin capped at 750 mg due to counts. IVF today, neutropenic precautions repeat counts with APP visit on 06-03-14. Hopefully [place] will recover more quickly with neulasta.  2.synchronous IA grade 1 endometrioid adenocarcinoma of uterus, treated surgically 3. PAC flipped 05-10-15, manipulated back into correct position for access by RN. Dye study fine. Peripheral IV access very  difficult 4.morbid obesity, BMI 59. Has lost 14 lbs since treatment 05-24-15 5.chemo peripheral neuropathy: lots of movement and exercise,  gabapentin at hs. Not interfering with function and, with potential benefit from taxol at this point, we are in agreement with continuing chemo and following  6.uncomfortable constipation x days despite increasing laxatives starting day before chemo, tho not quite as severe this cycle. Increase lactulose to tid in addition to miralax and colace until resolved. 7.acute renal failure with creatinine 12.4 at presentation with pyelonephritis, resolved. Creatinine a little higher today and not taking po fluids adequately, will give IVF now. Dosing carbo as noted 8.overdue mammograms, which we will address when possible. 9.perforated diverticular disease with temporary colostomy 2008.  10..flu vaccine 12-12-14 11. no DVT by LE dopplers after 03-29-15. Completed post op prophylactic lovenox LE swelling improved.. [email or web address] iron deficiency anemia: tolerating oral ferrous fumarate, continue. f/u colonoscopy information 13.history of PCN allergy, which was not an anaphylactic reaction.  14.financial concerns: appreciate assistance from [facility]  15.increased GERD: now on protonix 16.chemo thrombocytopenia with prior cycles    All questions answered. Time spent 30 min including >50% counseling and coordination of care.   LIVESAY,LENNIS P, MD   05/31/2015, 9:39 AM

## 2015-06-01 ENCOUNTER — Telehealth: Payer: Self-pay

## 2015-06-01 DIAGNOSIS — Z95828 Presence of other vascular implants and grafts: Secondary | ICD-10-CM | POA: Insufficient documentation

## 2015-06-01 DIAGNOSIS — C569 Malignant neoplasm of unspecified ovary: Secondary | ICD-10-CM | POA: Insufficient documentation

## 2015-06-01 NOTE — Telephone Encounter (Signed)
[name] stated that she is feeling better today.  She is not dizzy. She is glad that she stayed for the IVF yesterday.  She is pusing the fluids. She has 2 bottles of water next to her now.  She is eating soft foods.   Afebrile. She has moved her bowels 4-5 times since yesterday afternoon.   She takes that her stomach is hurting a little.  Sh used the zofran last night.  Suggested that she take the Ativan to help with nausea and relax her.  She stil has the leg aches.  Using Oxyir just Bid to take edge off of aches.  Pt. will try to get a heating pad. The warm shower in the morning helps with the aches. She will call prior to 06-04-15 appointment if she has any questions or concerns. She will go to the ED if temp is 100.5 or greater.

## 2015-06-01 NOTE — Telephone Encounter (Signed)
-----   Message from Gordy Levan, MD sent at 05/31/2015  9:37 AM EST ----- Please call her on Fri 2-24 to follow up neutropenia, fluid intake, bowels

## 2015-06-04 ENCOUNTER — Ambulatory Visit (HOSPITAL_BASED_OUTPATIENT_CLINIC_OR_DEPARTMENT_OTHER): Payer: BLUE CROSS/BLUE SHIELD | Admitting: Oncology

## 2015-06-04 ENCOUNTER — Encounter: Payer: Self-pay | Admitting: Oncology

## 2015-06-04 ENCOUNTER — Other Ambulatory Visit (HOSPITAL_BASED_OUTPATIENT_CLINIC_OR_DEPARTMENT_OTHER): Payer: BLUE CROSS/BLUE SHIELD

## 2015-06-04 VITALS — BP 135/78 | HR 119 | Temp 98.4°F | Resp 20 | Wt 343.9 lb

## 2015-06-04 DIAGNOSIS — G622 Polyneuropathy due to other toxic agents: Secondary | ICD-10-CM

## 2015-06-04 DIAGNOSIS — C541 Malignant neoplasm of endometrium: Secondary | ICD-10-CM

## 2015-06-04 DIAGNOSIS — C562 Malignant neoplasm of left ovary: Secondary | ICD-10-CM

## 2015-06-04 DIAGNOSIS — N179 Acute kidney failure, unspecified: Secondary | ICD-10-CM | POA: Diagnosis not present

## 2015-06-04 DIAGNOSIS — D6959 Other secondary thrombocytopenia: Secondary | ICD-10-CM

## 2015-06-04 DIAGNOSIS — K219 Gastro-esophageal reflux disease without esophagitis: Secondary | ICD-10-CM

## 2015-06-04 DIAGNOSIS — C561 Malignant neoplasm of right ovary: Secondary | ICD-10-CM | POA: Diagnosis not present

## 2015-06-04 DIAGNOSIS — K5909 Other constipation: Secondary | ICD-10-CM

## 2015-06-04 DIAGNOSIS — D509 Iron deficiency anemia, unspecified: Secondary | ICD-10-CM

## 2015-06-04 LAB — CBC WITH DIFFERENTIAL/PLATELET
BASO%: 0.1 % (ref 0.0–2.0)
BASOS ABS: 0 10*3/uL (ref 0.0–0.1)
EOS%: 0.1 % (ref 0.0–7.0)
Eosinophils Absolute: 0 10*3/uL (ref 0.0–0.5)
HEMATOCRIT: 31.2 % — AB (ref 34.8–46.6)
HGB: 9.9 g/dL — ABNORMAL LOW (ref 11.6–15.9)
LYMPH#: 2.6 10*3/uL (ref 0.9–3.3)
LYMPH%: 12.1 % — AB (ref 14.0–49.7)
MCH: 28.2 pg (ref 25.1–34.0)
MCHC: 31.8 g/dL (ref 31.5–36.0)
MCV: 88.8 fL (ref 79.5–101.0)
MONO#: 2.1 10*3/uL — AB (ref 0.1–0.9)
MONO%: 9.9 % (ref 0.0–14.0)
NEUT#: 16.8 10*3/uL — ABNORMAL HIGH (ref 1.5–6.5)
NEUT%: 77.8 % — AB (ref 38.4–76.8)
PLATELETS: 124 10*3/uL — AB (ref 145–400)
RBC: 3.51 10*6/uL — AB (ref 3.70–5.45)
RDW: 19.3 % — ABNORMAL HIGH (ref 11.2–14.5)
WBC: 21.6 10*3/uL — ABNORMAL HIGH (ref 3.9–10.3)

## 2015-06-04 LAB — COMPREHENSIVE METABOLIC PANEL
ALK PHOS: 92 U/L (ref 40–150)
ALT: 12 U/L (ref 0–55)
ANION GAP: 10 meq/L (ref 3–11)
AST: 16 U/L (ref 5–34)
Albumin: 3.3 g/dL — ABNORMAL LOW (ref 3.5–5.0)
BUN: 15.6 mg/dL (ref 7.0–26.0)
CALCIUM: 9.6 mg/dL (ref 8.4–10.4)
CO2: 24 mEq/L (ref 22–29)
Chloride: 105 mEq/L (ref 98–109)
Creatinine: 1.3 mg/dL — ABNORMAL HIGH (ref 0.6–1.1)
EGFR: 53 mL/min/{1.73_m2} — AB (ref >90)
GLUCOSE: 118 mg/dL (ref 70–140)
POTASSIUM: 4.3 meq/L (ref 3.5–5.1)
Sodium: 139 mEq/L (ref 136–145)
Total Protein: 6.8 g/dL (ref 6.4–8.3)

## 2015-06-04 NOTE — Progress Notes (Signed)
OFFICE PROGRESS NOTE   June 04, 2015   Physicians:Emma Rexford Maus, Kayleen Memos, MD (PCP),    INTERVAL HISTORY:  Patient is seen with her mother today for follow up. Today is day 12 cycle 3,  following carbo taxol on 05-24-15 with neulasta 05-29-15.  This was first neulasta, having used granix with previous cycles, timing due to severity of taxol aches. She is being treated adjuvantly for IIB carcinosarcoma of right ovary, post radical debulking including resection of portion of small bowel 03-13-15. PAC ok by dye study 05-24-15 after manually repositioned.   Patient was neutropenic at her last visit. Has not been having any fevers. Fatigue is improving, but still not sleeping well.  Has been using gabapentin for neuropathy which helps her sleep some. Eating and drinking better than last week. Has gained back 3 lbs. Bowels are moving well without the use of laxatives. She is voiding. Pain has resolved and not using pain medication at this time. She denies increased SOB or chest pain, bleeding, LE swelling. No discomfort at [facility]. Remainder of 10 point Review of Systems negative/ unchanged.   PAC by IR 04-11-15 Flu vaccine 12-12-14  ONCOLOGIC HISTORY Patient had intermittent right lower quadrant abdominal pain since Aug 2016, with pelvic US 11-30-14 with uterine fibroids (11.2 x 4.8 x 6.8 cm. 1.7 x 1.7 x 2.0 cm subserosal fibroid in the right uterine body. 6.3 x 3.6 x 5.7 cm probable exophytic/pedunculated fibroid along the anterior uterine fundus), endometrium poorly visualized, right ovary poorly visualized but measured 4 x 2.8 x 4.4 cm and left ovary 4.9 x 3.4 x 4.9 cm with probable corpus luteal cyst called. Note patient is morbidly obese, with BMI >60. She had some fevers during that time. Patient was admitted to [facility] 02-18-15 thru 02-21-15 with pyelonephritis and acute renal failure (creatinine 12.4), both of those problems resolving entirely with treatment. CT AP and MRI pelvis done to evaluate  the pyelonephritis demonstrated cystic and solid pelvic mass appearing to be arising from the right adnexa 13 x 9 x 17.5 cm, with a second cystic and solid lesion within the left adnexa measuring 4.4 x 6.5 x 5.3 cm. A third cystic and solid mass was seen anterior to the uterus and superior to the bladder measuring 4.2 x 5.4 x 5.3 cm. She had no pelvic lymphadenopathy, no carcinomatosis, and CA 125 was normal at 21 U/mL. She was seen in consultation by Dr Denman George, with exploratory laparotomy 03-23-15 with TAH BSO, partial small bowel resection and radical debulking, with additional ventral hernia repair. She was hospitalized with the surgery from 12-6 thru 03-16-15, discharged on prophylactic lovenox which she continues, no post op complications in hospital. Jodell Cipro were removed on 03-27-15, some separation of wound with wound care continuing by patient at home. 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 She saw Dr Denman George 03-29-15, with recommendation for adjuvant chemotherapy and follow up with gyn oncology expected after 6 cycles of chemotherapy. Cycle 1 carbo taxol given 04-12-15, carbo capped at 750 mg and taxol dosed at 175 mg/m2 using actual weight. She was neutropenic with ANCX 0.1 on day 12 cycle 1. Doses same for cycle 2, granix begun day 6, ANC 0.2 on day 8.   Objective:  Vital signs in last 24 hours:  LMP 10/16/2013 (Approximate) BMI 59 BP 135/78 mmHg  Pulse 119  Temp(Src) 98.4 F (36.9 C) (Oral)  Resp 20  Wt 343 lb 14.4 oz (155.992 kg)  SpO2 96%  LMP 10/16/2013 (Approximate)  Pulse rechecked during exam and was 96 and regular Weight up 3 lbs  Alert, oriented and appropriate. Ambulatory, respirations not labored seated in exam room. Looks fatigued but not in acute distress Alopecia  HEENT:PERRL, sclerae  not icteric. Oral mucosa somewhat dry without lesions, posterior pharynx clear.  Neck supple. No JVD.  Lymphatics:no cervical,supraclavicular adenopathy Resp: clear to auscultation bilaterally and normal percussion bilaterally Cardio: regular rate and rhythm. No gallop. GI: obese, soft, nontender, not obviously distended, cannot appreciate mass or organomegaly. Normally active bowel sounds. Surgical incision not remarkable. Musculoskeletal/ Extremities: without pitting edema, cords, tenderness Neuro: no significant peripheral neuropathy. Otherwise nonfocal Skin without rash, ecchymosis, petechiae, with candida rash at abdominal folds resolved. Portacath-without erythema or tenderness  Lab Results:  Results for orders placed or performed in visit on 06/04/15  CBC with Differential  Result Value Ref Range   WBC 21.6 (H) 3.9 - 10.3 10e3/uL   NEUT# 16.8 (H) 1.5 - 6.5 10e3/uL   HGB 9.9 (L) 11.6 - 15.9 g/dL   HCT 31.2 (L) 34.8 - 46.6 %   Platelets 124 (L) 145 - 400 10e3/uL   MCV 88.8 79.5 - 101.0 fL   MCH 28.2 25.1 - 34.0 pg   MCHC 31.8 31.5 - 36.0 g/dL   RBC 3.51 (L) 3.70 - 5.45 10e6/uL   RDW 19.3 (H) 11.2 - 14.5 %   lymph# 2.6 0.9 - 3.3 10e3/uL   MONO# 2.1 (H) 0.1 - 0.9 10e3/uL   Eosinophils Absolute 0.0 0.0 - 0.5 10e3/uL   Basophils Absolute 0.0 0.0 - 0.1 10e3/uL   NEUT% 77.8 (H) 38.4 - 76.8 %   LYMPH% 12.1 (L) 14.0 - 49.7 %   MONO% 9.9 0.0 - 14.0 %   EOS% 0.1 0.0 - 7.0 %   BASO% 0.1 0.0 - 2.0 %     Studies/Results:  No results found.  Medications: I have reviewed the patient's current medications. The patient has completed her Cipro.  DISCUSSION The patient's counts have improved and her fatigue is improving as well. She is eating and drinking better. I have encouraged her to drink more fluids as she tolerates. I recommend that she eat small frequent meals.  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 Pathology reviewed at [facility]. Taxol carboplatin begun 04-12-15, neutropenic by day 12 cycle 1, rapid increase in counts with 2 doses granix then. Neutropenic with ANC 0 despite granix cycle 2, fortunately recovered without infection. Neutropenic at 0.1 day 8 cycle 3 today despite neulasta day 6 (due to taxol aches).  Carboplatin capped at 750 mg due to counts. Counts have recovered today. She will have a routine follow-up with labs on March 6. 2.synchronous IA grade 1 endometrioid adenocarcinoma of uterus, treated surgically 3. PAC flipped 05-10-15, manipulated back into correct position for access by RN. Dye study fine. Peripheral IV access very difficult 4.morbid obesity, BMI 59. Has lost 14 lbs since treatment 05-24-15 5.chemo peripheral neuropathy: lots of movement and exercise,  gabapentin at hs. Not interfering with function and, with potential benefit from taxol at this point, we are in agreement with continuing chemo and following  6.uncomfortable constipation x days despite increasing laxatives starting day before chemo, tho not quite as  severe this cycle. Increase lactulose to tid in addition to miralax and colace until resolved. 7.acute renal failure with creatinine 12.4 at presentation with pyelonephritis, resolved. Creatinine is stable and she is taking fluids better at this time. Dosing carbo as noted 8.overdue mammograms, which we will address when possible. 9.perforated diverticular disease with temporary colostomy 2008.  10..flu vaccine 12-12-14 11. no DVT by LE dopplers after 03-29-15. Completed post op prophylactic lovenox LE swelling improved.. [email or web address] iron deficiency anemia: tolerating oral ferrous fumarate, continue. f/u colonoscopy information 13.history of PCN allergy, which was not an anaphylactic reaction.  14.financial concerns: appreciate assistance from [facility]  15.increased GERD: now on  protonix 16.chemo thrombocytopenia with prior cycles    All questions answered. Time spent 20 min including >50% counseling and coordination of care.   Mikey Bussing, NP   06/04/2015, 9:57 AM

## 2015-06-10 ENCOUNTER — Other Ambulatory Visit: Payer: Self-pay | Admitting: Oncology

## 2015-06-10 DIAGNOSIS — C561 Malignant neoplasm of right ovary: Secondary | ICD-10-CM

## 2015-06-11 ENCOUNTER — Other Ambulatory Visit (HOSPITAL_BASED_OUTPATIENT_CLINIC_OR_DEPARTMENT_OTHER): Payer: BLUE CROSS/BLUE SHIELD

## 2015-06-11 ENCOUNTER — Ambulatory Visit (HOSPITAL_BASED_OUTPATIENT_CLINIC_OR_DEPARTMENT_OTHER): Payer: BLUE CROSS/BLUE SHIELD | Admitting: Oncology

## 2015-06-11 ENCOUNTER — Encounter: Payer: Self-pay | Admitting: Oncology

## 2015-06-11 ENCOUNTER — Telehealth: Payer: Self-pay | Admitting: Oncology

## 2015-06-11 VITALS — BP 159/78 | HR 102 | Temp 97.4°F | Resp 19 | Ht 65.0 in | Wt 347.2 lb

## 2015-06-11 DIAGNOSIS — D509 Iron deficiency anemia, unspecified: Secondary | ICD-10-CM

## 2015-06-11 DIAGNOSIS — Z95828 Presence of other vascular implants and grafts: Secondary | ICD-10-CM

## 2015-06-11 DIAGNOSIS — C561 Malignant neoplasm of right ovary: Secondary | ICD-10-CM

## 2015-06-11 DIAGNOSIS — N179 Acute kidney failure, unspecified: Secondary | ICD-10-CM | POA: Diagnosis not present

## 2015-06-11 DIAGNOSIS — G620 Drug-induced polyneuropathy: Secondary | ICD-10-CM

## 2015-06-11 DIAGNOSIS — C562 Malignant neoplasm of left ovary: Secondary | ICD-10-CM

## 2015-06-11 DIAGNOSIS — K219 Gastro-esophageal reflux disease without esophagitis: Secondary | ICD-10-CM

## 2015-06-11 DIAGNOSIS — T451X5A Adverse effect of antineoplastic and immunosuppressive drugs, initial encounter: Secondary | ICD-10-CM

## 2015-06-11 DIAGNOSIS — D6959 Other secondary thrombocytopenia: Secondary | ICD-10-CM

## 2015-06-11 DIAGNOSIS — Z6841 Body Mass Index (BMI) 40.0 and over, adult: Secondary | ICD-10-CM

## 2015-06-11 DIAGNOSIS — D701 Agranulocytosis secondary to cancer chemotherapy: Secondary | ICD-10-CM

## 2015-06-11 DIAGNOSIS — K5909 Other constipation: Secondary | ICD-10-CM

## 2015-06-11 DIAGNOSIS — C541 Malignant neoplasm of endometrium: Secondary | ICD-10-CM | POA: Diagnosis not present

## 2015-06-11 DIAGNOSIS — G622 Polyneuropathy due to other toxic agents: Secondary | ICD-10-CM

## 2015-06-11 LAB — COMPREHENSIVE METABOLIC PANEL
ALK PHOS: 89 U/L (ref 40–150)
ALT: <9 U/L (ref 0–55)
ANION GAP: 10 meq/L (ref 3–11)
AST: 11 U/L (ref 5–34)
Albumin: 3.4 g/dL — ABNORMAL LOW (ref 3.5–5.0)
BUN: 19.1 mg/dL (ref 7.0–26.0)
CO2: 23 meq/L (ref 22–29)
CREATININE: 1.1 mg/dL (ref 0.6–1.1)
Calcium: 9.6 mg/dL (ref 8.4–10.4)
Chloride: 108 mEq/L (ref 98–109)
EGFR: 70 mL/min/{1.73_m2} — ABNORMAL LOW (ref >90)
GLUCOSE: 110 mg/dL (ref 70–140)
Potassium: 4.4 mEq/L (ref 3.5–5.1)
Sodium: 140 mEq/L (ref 136–145)
Total Protein: 7.2 g/dL (ref 6.4–8.3)

## 2015-06-11 LAB — CBC WITH DIFFERENTIAL/PLATELET
BASO%: 0.1 % (ref 0.0–2.0)
Basophils Absolute: 0 10*3/uL (ref 0.0–0.1)
EOS%: 0.2 % (ref 0.0–7.0)
Eosinophils Absolute: 0 10*3/uL (ref 0.0–0.5)
HCT: 29.9 % — ABNORMAL LOW (ref 34.8–46.6)
HGB: 9.9 g/dL — ABNORMAL LOW (ref 11.6–15.9)
LYMPH%: 19 % (ref 14.0–49.7)
MCH: 29.3 pg (ref 25.1–34.0)
MCHC: 33.1 g/dL (ref 31.5–36.0)
MCV: 88.5 fL (ref 79.5–101.0)
MONO#: 0.5 10*3/uL (ref 0.1–0.9)
MONO%: 6.1 % (ref 0.0–14.0)
NEUT%: 74.6 % (ref 38.4–76.8)
NEUTROS ABS: 6.5 10*3/uL (ref 1.5–6.5)
Platelets: 52 10*3/uL — ABNORMAL LOW (ref 145–400)
RBC: 3.38 10*6/uL — AB (ref 3.70–5.45)
RDW: 17.7 % — AB (ref 11.2–14.5)
WBC: 8.7 10*3/uL (ref 3.9–10.3)
lymph#: 1.7 10*3/uL (ref 0.9–3.3)

## 2015-06-11 LAB — IRON AND TIBC
%SAT: 24 % (ref 21–57)
Iron: 71 ug/dL (ref 41–142)
TIBC: 291 ug/dL (ref 236–444)
UIBC: 221 ug/dL (ref 120–384)

## 2015-06-11 NOTE — Telephone Encounter (Signed)
appt made and avs printed °

## 2015-06-11 NOTE — Progress Notes (Signed)
OFFICE PROGRESS NOTE   June 13, 2015   Physicians: Everitt Amber, Alveda Reasons, MD (PCP),  INTERVAL HISTORY:  Patient is seen, together with mother, in continuing attention to adjuvant chemotherapy in progress for IIB carcinosarcoma of right ovary. She had cycle 3 carbo taxol on 05-24-15 with neulasta on 05-29-15, then was neutropenic with ANC 0.1 on 05-31-15.  Needed IVF after cycle 3 due to inadequate po intake and increased creatinine. [place] recovered to 16.8 by 06-04-15. Platelets are low today at 52K so that cycle 4 will be delayed a week and carbo dose adjusted. Note Norma Fredrickson has been capped at 750 mg with previous cycles (not full dose based on weight).   Patient had no infectious complications when neutropenic and denies any bleeding now. She has no nausea now and no pain or taxol/ neulasta aches now. Bowels are moving again, tho she had several days of constipation with most recent cycle despite bid lactulose, miralax and colace which she began day prior to treatment. She is eating and drinking fluids. She notices rapid heart rate with exertion such as walking in office. She has no peripheral neuropathy in hands, but some slight numbness in feet and legs especially at night. No discomfort at [facility]. No unusual bruising. She is taking oral iron.  Remainder of 10 point Review of Systems negative/ unchanged.   PAC by IR 04-11-15 Flu vaccine 12-12-14  ONCOLOGIC HISTORY Patient had intermittent right lower quadrant abdominal pain since Aug 2016, with pelvic US 11-30-14 with uterine fibroids (11.2 x 4.8 x 6.8 cm. 1.7 x 1.7 x 2.0 cm subserosal fibroid in the right uterine body. 6.3 x 3.6 x 5.7 cm probable exophytic/pedunculated fibroid along the anterior uterine fundus), endometrium poorly visualized, right ovary poorly visualized but measured 4 x 2.8 x 4.4 cm and left ovary 4.9 x 3.4 x 4.9 cm with probable corpus luteal cyst called. Note patient is morbidly obese, with BMI >60. She had some fevers during  that time. Patient was admitted to [facility] 02-18-15 thru 02-21-15 with pyelonephritis and acute renal failure (creatinine 12.4), both of those problems resolving entirely with treatment. CT AP and MRI pelvis done to evaluate the pyelonephritis demonstrated cystic and solid pelvic mass appearing to be arising from the right adnexa 13 x 9 x 17.5 cm, with a second cystic and solid lesion within the left adnexa measuring 4.4 x 6.5 x 5.3 cm. A third cystic and solid mass was seen anterior to the uterus and superior to the bladder measuring 4.2 x 5.4 x 5.3 cm. She had no pelvic lymphadenopathy, no carcinomatosis, and CA 125 was normal at 21 U/mL. She was seen in consultation by Dr Denman George, with exploratory laparotomy 03-23-15 with TAH BSO, partial small bowel resection and radical debulking, with additional ventral hernia repair. She was hospitalized with the surgery from 12-6 thru 03-16-15, discharged on prophylactic lovenox which she continues, no post op complications in hospital. [place] Cipro were removed on 03-27-15, some separation of wound with wound care continuing by patient at home. Surgical pathology [id number]) was reviewed at [facility], with final diagnosis of IIB carcinosarcoma of right ovary with involvement of left ovary and uterine serosa, and IA grade 1 endometrioid adenocarcinoma of uterus. She saw Dr Denman George 03-29-15, with recommendation for adjuvant chemotherapy and follow up with gyn oncology expected after 6 cycles of chemotherapy. Cycle 1 carbo taxol given 04-12-15, carbo capped at 750 mg and taxol dosed at 175 mg/m2 using actual weight. She was neutropenic with ANCX 0.1 on  day 12 cycle 1. Doses same for cycle 2, granix begun day 6, ANC 0.2 on day 8.  Objective:  Vital signs in last 24 hours:  BP 159/78 mmHg  Pulse 102  Temp(Src) 97.4 F (36.3 C) (Oral)  Resp 19  Ht _0  (1.651 m)  Wt 347 lb 3.2 oz (157.489 kg)  BMI 57.78 kg/m2  SpO2 100%  LMP 10/16/2013 (Approximate) Morbidly  obese. Alert, oriented and appropriate, very pleasant and mother very supportive. Ambulatory without assistance, able to get on and off exam table with effort.  Alopecia  HEENT:PERRL, sclerae not icteric. Oral mucosa moist without lesions, posterior pharynx clear.  Neck supple. No JVD.  Lymphatics:no cervical,supraclavicular adenopathy Resp: clear to auscultation bilaterally and normal percussion bilaterally Cardio: tachy, regular rate and rhythm. No gallop. GI: abdomen obese, soft, nontender, not distended, cannot appreciate mass or organomegaly. A few bowel sounds. Surgical incision not remarkable. Musculoskeletal/ Extremities: without pitting edema, cords, tenderness Neuro: no significant peripheral neuropathy. Otherwise nonfocal. PSYCH appropriate mood and affect Skin without rash, ecchymosis, petechiae including in deep skin folds at surgical incision Portacath-without erythema or tenderness, is obviously a little mobile in tissues, position seems correct now.  Lab Results:  Results for orders placed or performed in visit on 06/11/15  CBC with Differential  Result Value Ref Range   WBC 8.7 3.9 - 10.3 10e3/uL   NEUT# 6.5 1.5 - 6.5 10e3/uL   HGB 9.9 (L) 11.6 - 15.9 g/dL   HCT 29.9 (L) 34.8 - 46.6 %   Platelets 52 (L) 145 - 400 10e3/uL   MCV 88.5 79.5 - 101.0 fL   MCH 29.3 25.1 - 34.0 pg   MCHC 33.1 31.5 - 36.0 g/dL   RBC 3.38 (L) 3.70 - 5.45 10e6/uL   RDW 17.7 (H) 11.2 - 14.5 %   lymph# 1.7 0.9 - 3.3 10e3/uL   MONO# 0.5 0.1 - 0.9 10e3/uL   Eosinophils Absolute 0.0 0.0 - 0.5 10e3/uL   Basophils Absolute 0.0 0.0 - 0.1 10e3/uL   NEUT% 74.6 38.4 - 76.8 %   LYMPH% 19.0 14.0 - 49.7 %   MONO% 6.1 0.0 - 14.0 %   EOS% 0.2 0.0 - 7.0 %   BASO% 0.1 0.0 - 2.0 %  Comprehensive metabolic panel  Result Value Ref Range   Sodium 140 136 - 145 mEq/L   Potassium 4.4 3.5 - 5.1 mEq/L   Chloride 108 98 - 109 mEq/L   CO2 23 22 - 29 mEq/L   Glucose 110 70 - 140 mg/dl   BUN 19.1 7.0 - 26.0  mg/dL   Creatinine 1.1 0.6 - 1.1 mg/dL   Total Bilirubin <0.30 0.20 - 1.20 mg/dL   Alkaline Phosphatase 89 40 - 150 U/L   AST 11 5 - 34 U/L   ALT <9 0 - 55 U/L   Total Protein 7.2 6.4 - 8.3 g/dL   Albumin 3.4 (L) 3.5 - 5.0 g/dL   Calcium 9.6 8.4 - 10.4 mg/dL   Anion Gap 10 3 - 11 mEq/L   EGFR 70 (L) >90 ml/min/1.73 m2  Iron and TIBC  Result Value Ref Range   Iron 71 41 - 142 ug/dL   TIBC 291 236 - 444 ug/dL   UIBC 221 120 - 384 ug/dL   %SAT 24 21 - 57 %   these iron studies are on oral iron, last had serum iron 40  And %sat 16  Studies/Results:  No results found.  Medications: I have reviewed the patient's  current medications. Feraheme this week if preauthorized. OK to increase lactulose/ miralax to tid around chemo; will also suggest magnesium citrate day after chemo if no BM. On pro neulasta with cycle 4.  DISCUSSION Labs discussed, with delay cycle 4 x 1 week due to thrombocytopenia today.  Discussed moving neulasta closer to chemo even with taxol aches, either by on pro injector at 24 hrs post chemo, which would save her a trip to this office, or neulasta injection at [facility] on day 2 due to insurance requirements. She prefers on pro injector.   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 Pathology reviewed at [facility]. Taxol carboplatin begun 04-12-15. Significant taxol aches, however marked neutropenia with delays in gCSF including neulasta with cycle 3. Will try on pro neulasta with cycle 4. 2.synchronous IA grade 1 endometrioid adenocarcinoma of uterus, treated surgically 3. PAC flipped 05-10-15, manipulated back into correct positio, follow up dye study fine. Peripheral IV access very difficult 4.morbid obesity, BMI 59. This obviously impacts tolerance for the anemia and increases risk for the gyn malignancy 5.chemo peripheral  neuropathy: lots of movement and exercise, gabapentin at hs. Not interfering with function and, with potential benefit from taxol at this point, we are in agreement with continuing chemo and following  6.uncomfortable constipation x days despite increasing laxatives starting day before chemo, tho not quite as severe this cycle. Increase lactulose to tid in addition to miralax and colace prn. Will suggest magnesium citrate day after chemo if no BM then. 7.acute renal failure with creatinine 12.4 at presentation with pyelonephritis, resolved. Creatinine better today with better hydration. Dosing carbo as noted 8.overdue mammograms, which we will address when possible. 9.perforated diverticular disease with temporary colostomy 2008.  10..flu vaccine 12-12-14 11. no DVT by LE dopplers after 03-29-15. Completed post op prophylactic lovenox LE swelling improved.. 12. iron deficiency anemia: took oral iron prior to lab test today, previously documented lower, hemoglobin now 9.9 and more symptomatic.  Oral iron may be aggravating severe constipation.  Discussed IV iron this week, may give only single dose and follow. f/u colonoscopy information 13.history of PCN allergy, which was not an anaphylactic reaction.  14.financial concerns: appreciate assistance from [facility]  15.increased GERD: now on protonix 16.chemo thrombocytopenia: delay cycle 4 x 1 week, decrease carbo dose. Call if bleeding.    All questions answered. Chemo orders changed, on pro neulasta added, feraheme placed, managed care notified. Time spent 90 min including >50% counseling and coordination of care.    LIVESAY,LENNIS P, MD   06/13/2015, 8:02 AM

## 2015-06-13 DIAGNOSIS — G620 Drug-induced polyneuropathy: Secondary | ICD-10-CM | POA: Insufficient documentation

## 2015-06-13 DIAGNOSIS — T451X5A Adverse effect of antineoplastic and immunosuppressive drugs, initial encounter: Secondary | ICD-10-CM

## 2015-06-13 DIAGNOSIS — Z6841 Body Mass Index (BMI) 40.0 and over, adult: Secondary | ICD-10-CM

## 2015-06-14 ENCOUNTER — Other Ambulatory Visit: Payer: BLUE CROSS/BLUE SHIELD

## 2015-06-14 ENCOUNTER — Ambulatory Visit (HOSPITAL_BASED_OUTPATIENT_CLINIC_OR_DEPARTMENT_OTHER): Payer: BLUE CROSS/BLUE SHIELD

## 2015-06-14 ENCOUNTER — Ambulatory Visit: Payer: BLUE CROSS/BLUE SHIELD

## 2015-06-14 ENCOUNTER — Telehealth: Payer: Self-pay

## 2015-06-14 VITALS — BP 113/57 | HR 80 | Temp 97.5°F | Resp 17

## 2015-06-14 DIAGNOSIS — D509 Iron deficiency anemia, unspecified: Secondary | ICD-10-CM | POA: Diagnosis not present

## 2015-06-14 DIAGNOSIS — C561 Malignant neoplasm of right ovary: Secondary | ICD-10-CM

## 2015-06-14 MED ORDER — SODIUM CHLORIDE 0.9% FLUSH
10.0000 mL | INTRAVENOUS | Status: DC | PRN
  Administered 2015-06-14: 10 mL via INTRAVENOUS
  Filled 2015-06-14: qty 10

## 2015-06-14 MED ORDER — HEPARIN SOD (PORK) LOCK FLUSH 100 UNIT/ML IV SOLN
500.0000 [IU] | Freq: Once | INTRAVENOUS | Status: AC
Start: 2015-06-14 — End: 2015-06-14
  Administered 2015-06-14: 500 [IU] via INTRAVENOUS
  Filled 2015-06-14: qty 5

## 2015-06-14 MED ORDER — SODIUM CHLORIDE 0.9 % IV SOLN
510.0000 mg | Freq: Once | INTRAVENOUS | Status: AC
  Administered 2015-06-14: 510 mg via INTRAVENOUS
  Filled 2015-06-14: qty 17

## 2015-06-14 NOTE — Telephone Encounter (Signed)
S/w [name] per Dr Edwyna Shell note that she can use magnesium citrate the day after chemo if her bowels don't move by then. They had already discussed tid lactulose and miralax.  [name] stated she has oxycodone pills at home for neulasta aches if she needs them, she will continue the claritin. [name] asked if she needs to continue her oral iron now that she got iv feraheme.

## 2015-06-14 NOTE — Telephone Encounter (Signed)
S/w Dr Edwyna Shell and called pt back. She is to stop her oral iron since receiving feraheme. This will help with constipation issue.

## 2015-06-14 NOTE — Telephone Encounter (Signed)
-----   Message from Gordy Levan, MD sent at 06/13/2015  8:37 AM EST ----- For IV iron on 3-9 For (delayed) cycle 4 chemo with first on pro neulasta on 3-16.  Please suggest she take magnesium citrate day after chemo if bowels do not move that day. We have already discussed tid lactulose and miralax starting day before chemo.  Please be sure she has some oxycodone available still, as neulasta day after chemo may increase aches.  Thank you

## 2015-06-14 NOTE — Patient Instructions (Signed)

## 2015-06-14 NOTE — Progress Notes (Signed)
Pt given first infusion of Feraheme without complications.  Pt monitored for 30 minutes post infusion. Vitals obtained and remain stable as charted.  Pt without concerns or complaints at this time.  Pt discharged ambulatory with personal cane accompanied by family.

## 2015-06-21 ENCOUNTER — Other Ambulatory Visit (HOSPITAL_BASED_OUTPATIENT_CLINIC_OR_DEPARTMENT_OTHER): Payer: BLUE CROSS/BLUE SHIELD

## 2015-06-21 ENCOUNTER — Encounter: Payer: Self-pay | Admitting: Oncology

## 2015-06-21 ENCOUNTER — Ambulatory Visit (HOSPITAL_BASED_OUTPATIENT_CLINIC_OR_DEPARTMENT_OTHER): Payer: BLUE CROSS/BLUE SHIELD

## 2015-06-21 VITALS — BP 166/78 | HR 85 | Temp 98.1°F | Resp 17

## 2015-06-21 DIAGNOSIS — Z5111 Encounter for antineoplastic chemotherapy: Secondary | ICD-10-CM

## 2015-06-21 DIAGNOSIS — C561 Malignant neoplasm of right ovary: Secondary | ICD-10-CM

## 2015-06-21 LAB — COMPREHENSIVE METABOLIC PANEL
ALK PHOS: 92 U/L (ref 40–150)
ALT: 10 U/L (ref 0–55)
ANION GAP: 11 meq/L (ref 3–11)
AST: 13 U/L (ref 5–34)
Albumin: 3.6 g/dL (ref 3.5–5.0)
BUN: 27.2 mg/dL — ABNORMAL HIGH (ref 7.0–26.0)
CALCIUM: 10.5 mg/dL — AB (ref 8.4–10.4)
CHLORIDE: 105 meq/L (ref 98–109)
CO2: 22 mEq/L (ref 22–29)
CREATININE: 1.3 mg/dL — AB (ref 0.6–1.1)
EGFR: 57 mL/min/{1.73_m2} — ABNORMAL LOW (ref >90)
Glucose: 168 mg/dl — ABNORMAL HIGH (ref 70–140)
POTASSIUM: 5.5 meq/L — AB (ref 3.5–5.1)
Sodium: 138 mEq/L (ref 136–145)
Total Bilirubin: <0.3 mg/dL (ref 0.20–1.20)
Total Protein: 7.7 g/dL (ref 6.4–8.3)

## 2015-06-21 LAB — CBC WITH DIFFERENTIAL/PLATELET
BASO%: 0.1 % (ref 0.0–2.0)
Basophils Absolute: 0 10*3/uL (ref 0.0–0.1)
EOS%: 0 % (ref 0.0–7.0)
Eosinophils Absolute: 0 10*3/uL (ref 0.0–0.5)
HEMATOCRIT: 33 % — AB (ref 34.8–46.6)
HGB: 10.9 g/dL — ABNORMAL LOW (ref 11.6–15.9)
LYMPH#: 1.4 10*3/uL (ref 0.9–3.3)
LYMPH%: 15.9 % (ref 14.0–49.7)
MCH: 29.9 pg (ref 25.1–34.0)
MCHC: 33 g/dL (ref 31.5–36.0)
MCV: 90.4 fL (ref 79.5–101.0)
MONO#: 0 10*3/uL — AB (ref 0.1–0.9)
MONO%: 0.4 % (ref 0.0–14.0)
NEUT#: 7.5 10*3/uL — ABNORMAL HIGH (ref 1.5–6.5)
NEUT%: 83.6 % — ABNORMAL HIGH (ref 38.4–76.8)
PLATELETS: 312 10*3/uL (ref 145–400)
RBC: 3.65 10*6/uL — ABNORMAL LOW (ref 3.70–5.45)
RDW: 19.7 % — AB (ref 11.2–14.5)
WBC: 8.9 10*3/uL (ref 3.9–10.3)

## 2015-06-21 MED ORDER — PACLITAXEL CHEMO INJECTION 300 MG/50ML: 175.0000 mg/m2 | Freq: Once | INTRAVENOUS | Status: DC

## 2015-06-21 MED ORDER — SODIUM CHLORIDE 0.9 % IV SOLN
Freq: Once | INTRAVENOUS | Status: AC
  Administered 2015-06-21: 09:00:00 via INTRAVENOUS
  Filled 2015-06-21: qty 8

## 2015-06-21 MED ORDER — DIPHENHYDRAMINE HCL 50 MG/ML IJ SOLN
INTRAMUSCULAR | Status: AC
  Filled 2015-06-21: qty 1

## 2015-06-21 MED ORDER — SODIUM CHLORIDE 0.9 % IV SOLN
Freq: Once | INTRAVENOUS | Status: AC
  Administered 2015-06-21: 09:00:00 via INTRAVENOUS

## 2015-06-21 MED ORDER — FAMOTIDINE IN NACL 20-0.9 MG/50ML-% IV SOLN
INTRAVENOUS | Status: AC
  Filled 2015-06-21: qty 50

## 2015-06-21 MED ORDER — SODIUM CHLORIDE 0.9 % IV SOLN
600.0000 mg | Freq: Once | INTRAVENOUS | Status: AC
  Administered 2015-06-21: 600 mg via INTRAVENOUS
  Filled 2015-06-21: qty 60

## 2015-06-21 MED ORDER — FAMOTIDINE IN NACL 20-0.9 MG/50ML-% IV SOLN
20.0000 mg | Freq: Once | INTRAVENOUS | Status: AC
  Administered 2015-06-21: 20 mg via INTRAVENOUS

## 2015-06-21 MED ORDER — DIPHENHYDRAMINE HCL 50 MG/ML IJ SOLN
50.0000 mg | Freq: Once | INTRAMUSCULAR | Status: AC
  Administered 2015-06-21: 50 mg via INTRAVENOUS

## 2015-06-21 MED ORDER — SODIUM CHLORIDE 0.9 % IV SOLN
175.0000 mg/m2 | Freq: Once | INTRAVENOUS | Status: AC
  Administered 2015-06-21: 492 mg via INTRAVENOUS
  Filled 2015-06-21: qty 82

## 2015-06-21 MED ORDER — SODIUM CHLORIDE 0.9 % IJ SOLN
10.0000 mL | INTRAMUSCULAR | Status: DC | PRN
  Administered 2015-06-21: 10 mL
  Filled 2015-06-21: qty 10

## 2015-06-21 MED ORDER — PEGFILGRASTIM 6 MG/0.6ML ~~LOC~~ PSKT
6.0000 mg | PREFILLED_SYRINGE | Freq: Once | SUBCUTANEOUS | Status: AC
  Administered 2015-06-21: 6 mg via SUBCUTANEOUS
  Filled 2015-06-21: qty 0.6

## 2015-06-21 MED ORDER — HEPARIN SOD (PORK) LOCK FLUSH 100 UNIT/ML IV SOLN
500.0000 [IU] | Freq: Once | INTRAVENOUS | Status: AC | PRN
  Administered 2015-06-21: 500 [IU]
  Filled 2015-06-21: qty 5

## 2015-06-21 NOTE — Patient Instructions (Signed)
[facility] Discharge Instructions for Patients Receiving Chemotherapy  Today you received the following chemotherapy agents Taxol and Carboplatin. To help prevent nausea and vomiting after your treatment, we encourage you to take your nausea medication as directed.  If you develop nausea and vomiting that is not controlled by your nausea medication, call the clinic.   BELOW ARE SYMPTOMS THAT SHOULD BE REPORTED IMMEDIATELY:  *FEVER GREATER THAN 100.5 F  *CHILLS WITH OR WITHOUT FEVER  NAUSEA AND VOMITING THAT IS NOT CONTROLLED WITH YOUR NAUSEA MEDICATION  *UNUSUAL SHORTNESS OF BREATH  *UNUSUAL BRUISING OR BLEEDING  TENDERNESS IN MOUTH AND THROAT WITH OR WITHOUT PRESENCE OF ULCERS  *URINARY PROBLEMS  *BOWEL PROBLEMS  UNUSUAL RASH Items with * indicate a potential emergency and should be followed up as soon as possible.  Feel free to call the clinic you have any questions or concerns. The clinic phone number is [phone].  Please show the CHEMO ALERT CARD at check-in to the Emergency Department and triage nurse.    

## 2015-06-21 NOTE — Progress Notes (Signed)
Reviewed labs with Dr. Marko Plume, okay to proceed with treatment. Educated pt on foods high in potassium and to reduce potassium intake in diet per Dr. Marko Plume. Pt verbalizes understanding.

## 2015-06-21 NOTE — Progress Notes (Signed)
left in box-patient in treatment. will leave on dr. Mariana Kaufman desk to sign

## 2015-06-22 ENCOUNTER — Encounter: Payer: Self-pay | Admitting: Oncology

## 2015-06-22 NOTE — Progress Notes (Signed)
left in box-patient in treatment. will leave on dr. Mariana Kaufman desk to sign-faxed to [facility] [phone] and mailed copy to patient for her records- sent to medical records also

## 2015-06-22 NOTE — Progress Notes (Signed)
Patient advised will pk up at front with ms wilma on 06/28/15 visit

## 2015-06-22 NOTE — Progress Notes (Signed)
I sent mess to dr to see if intermittent or continuous leave should be approved.  Sedge sent mess to confirm which one.

## 2015-06-24 ENCOUNTER — Other Ambulatory Visit: Payer: Self-pay | Admitting: Oncology

## 2015-06-24 DIAGNOSIS — C561 Malignant neoplasm of right ovary: Secondary | ICD-10-CM

## 2015-06-26 ENCOUNTER — Encounter: Payer: Self-pay | Admitting: Oncology

## 2015-06-26 NOTE — Progress Notes (Signed)
refaxed showing continuous leave not intermittent-per dr Marko Plume

## 2015-06-27 ENCOUNTER — Emergency Department (HOSPITAL_COMMUNITY)
Admission: EM | Admit: 2015-06-27 | Discharge: 2015-06-27 | Disposition: A | Discharge status: Home / Self Care | Payer: BLUE CROSS/BLUE SHIELD | Attending: Emergency Medicine | Admitting: Emergency Medicine

## 2015-06-27 ENCOUNTER — Telehealth: Payer: Self-pay | Admitting: *Deleted

## 2015-06-27 ENCOUNTER — Encounter (HOSPITAL_COMMUNITY): Payer: Self-pay | Admitting: Emergency Medicine

## 2015-06-27 ENCOUNTER — Emergency Department (HOSPITAL_COMMUNITY): Payer: BLUE CROSS/BLUE SHIELD

## 2015-06-27 DIAGNOSIS — Z8543 Personal history of malignant neoplasm of ovary: Secondary | ICD-10-CM | POA: Diagnosis not present

## 2015-06-27 DIAGNOSIS — K219 Gastro-esophageal reflux disease without esophagitis: Secondary | ICD-10-CM | POA: Insufficient documentation

## 2015-06-27 DIAGNOSIS — Z87448 Personal history of other diseases of urinary system: Secondary | ICD-10-CM | POA: Insufficient documentation

## 2015-06-27 DIAGNOSIS — Z79899 Other long term (current) drug therapy: Secondary | ICD-10-CM | POA: Insufficient documentation

## 2015-06-27 DIAGNOSIS — I10 Essential (primary) hypertension: Secondary | ICD-10-CM | POA: Insufficient documentation

## 2015-06-27 DIAGNOSIS — Z880 Allergy status to penicillin: Secondary | ICD-10-CM | POA: Insufficient documentation

## 2015-06-27 DIAGNOSIS — R42 Dizziness and giddiness: Secondary | ICD-10-CM

## 2015-06-27 DIAGNOSIS — Z8669 Personal history of other diseases of the nervous system and sense organs: Secondary | ICD-10-CM | POA: Insufficient documentation

## 2015-06-27 DIAGNOSIS — D649 Anemia, unspecified: Secondary | ICD-10-CM | POA: Diagnosis not present

## 2015-06-27 DIAGNOSIS — Z8542 Personal history of malignant neoplasm of other parts of uterus: Secondary | ICD-10-CM | POA: Diagnosis not present

## 2015-06-27 DIAGNOSIS — R112 Nausea with vomiting, unspecified: Secondary | ICD-10-CM | POA: Diagnosis not present

## 2015-06-27 LAB — CBC WITH DIFFERENTIAL/PLATELET
BASOS ABS: 0 10*3/uL (ref 0.0–0.1)
Basophils Relative: 1 %
EOS ABS: 0 10*3/uL (ref 0.0–0.7)
Eosinophils Relative: 1 %
HCT: 32.5 % — ABNORMAL LOW (ref 36.0–46.0)
Hemoglobin: 10.6 g/dL — ABNORMAL LOW (ref 12.0–15.0)
Lymphocytes Relative: 56 %
Lymphs Abs: 2.1 10*3/uL (ref 0.7–4.0)
MCH: 30.3 pg (ref 26.0–34.0)
MCHC: 32.6 g/dL (ref 30.0–36.0)
MCV: 92.9 fL (ref 78.0–100.0)
MONO ABS: 0.8 10*3/uL (ref 0.1–1.0)
Monocytes Relative: 23 %
NEUTROS PCT: 19 %
Neutro Abs: 0.7 10*3/uL — ABNORMAL LOW (ref 1.7–7.7)
PLATELETS: 256 10*3/uL (ref 150–400)
RBC: 3.5 MIL/uL — AB (ref 3.87–5.11)
RDW: 19.4 % — AB (ref 11.5–15.5)
WBC: 3.6 10*3/uL — AB (ref 4.0–10.5)

## 2015-06-27 LAB — BASIC METABOLIC PANEL
ANION GAP: 12 (ref 5–15)
BUN: 20 mg/dL (ref 6–20)
CALCIUM: 10.1 mg/dL (ref 8.9–10.3)
CO2: 24 mmol/L (ref 22–32)
CREATININE: 1.14 mg/dL — AB (ref 0.44–1.00)
Chloride: 104 mmol/L (ref 101–111)
GFR, EST NON AFRICAN AMERICAN: 55 mL/min — AB (ref >60)
GLUCOSE: 135 mg/dL — AB (ref 65–99)
Potassium: 4.2 mmol/L (ref 3.5–5.1)
Sodium: 140 mmol/L (ref 135–145)

## 2015-06-27 MED ORDER — MECLIZINE HCL 25 MG PO TABS: 25.0000 mg | ORAL_TABLET | Freq: Three times a day (TID) | ORAL | Status: DC | PRN

## 2015-06-27 MED ORDER — ONDANSETRON HCL 4 MG/2ML IJ SOLN
4.0000 mg | Freq: Once | INTRAMUSCULAR | Status: AC
  Administered 2015-06-27: 4 mg via INTRAVENOUS
  Filled 2015-06-27: qty 2

## 2015-06-27 MED ORDER — SODIUM CHLORIDE 0.9 % IV BOLUS (SEPSIS)
1000.0000 mL | Freq: Once | INTRAVENOUS | Status: AC
  Administered 2015-06-27: 1000 mL via INTRAVENOUS

## 2015-06-27 MED ORDER — HEPARIN SOD (PORK) LOCK FLUSH 100 UNIT/ML IV SOLN
500.0000 [IU] | Freq: Once | INTRAVENOUS | Status: AC
  Administered 2015-06-27: 500 [IU]
  Filled 2015-06-27: qty 5

## 2015-06-27 MED ORDER — MECLIZINE HCL 25 MG PO TABS
25.0000 mg | ORAL_TABLET | Freq: Once | ORAL | Status: AC
  Administered 2015-06-27: 25 mg via ORAL
  Filled 2015-06-27: qty 1

## 2015-06-27 MED FILL — PANTOPRAZOLE SOD DR 40 MG T: 40 | 30 days supply | Qty: 30 | Fill #1

## 2015-06-27 NOTE — ED Notes (Signed)
Pt states she has had dizziness since her last chemo on the 16th  Pt states it got worse last night  Pt states when she stands up she becomes dizzy and she had vomiting yesterday and today  Pt states she called her dr and was told to come here for evaluation and possible vertigo  Pt states she took some nausea medication about 6pm today

## 2015-06-27 NOTE — Discharge Instructions (Signed)
Your work-up today including labs and head CT was overall normal.   We feel that you have been suffering with vertigo which was treated with medication here. You may take this medication as needed at home if symptoms recur. Please follow-up with your primary care physician. Return here for new concerns.  Benign Positional Vertigo Vertigo is the feeling that you or your surroundings are moving when they are not. Benign positional vertigo is the most common form of vertigo. The cause of this condition is not serious (is benign). This condition is triggered by certain movements and positions (is positional). This condition can be dangerous if it occurs while you are doing something that could endanger you or others, such as driving.  CAUSES In many cases, the cause of this condition is not known. It may be caused by a disturbance in an area of the inner ear that helps your brain to sense movement and balance. This disturbance can be caused by a viral infection (labyrinthitis), head injury, or repetitive motion. RISK FACTORS This condition is more likely to develop in:  Women.  People who are 65 years of age or older. SYMPTOMS Symptoms of this condition usually happen when you move your head or your eyes in different directions. Symptoms may start suddenly, and they usually last for less than a minute. Symptoms may include:  Loss of balance and falling.  Feeling like you are spinning or moving.  Feeling like your surroundings are spinning or moving.  Nausea and vomiting.  Blurred vision.  Dizziness.  Involuntary eye movement (nystagmus). Symptoms can be mild and cause only slight annoyance, or they can be severe and interfere with daily life. Episodes of benign positional vertigo may return (recur) over time, and they may be triggered by certain movements. Symptoms may improve over time. DIAGNOSIS This condition is usually diagnosed by medical history and a physical exam of the head,  neck, and ears. You may be referred to a health care provider who specializes in ear, nose, and throat (ENT) problems (otolaryngologist) or a provider who specializes in disorders of the nervous system (neurologist). You may have additional testing, including:  MRI.  A CT scan.  Eye movement tests. Your health care provider may ask you to change positions quickly while he or she watches you for symptoms of benign positional vertigo, such as nystagmus. Eye movement may be tested with an electronystagmogram (ENG), caloric stimulation, the Dix-Hallpike test, or the roll test.  An electroencephalogram (EEG). This records electrical activity in your brain.  Hearing tests. TREATMENT Usually, your health care provider will treat this by moving your head in specific positions to adjust your inner ear back to normal. Surgery may be needed in severe cases, but this is rare. In some cases, benign positional vertigo may resolve on its own in 2-4 weeks. HOME CARE INSTRUCTIONS Safety  Move slowly.Avoid sudden body or head movements.  Avoid driving.  Avoid operating heavy machinery.  Avoid doing any tasks that would be dangerous to you or others if a vertigo episode would occur.  If you have trouble walking or keeping your balance, try using a cane for stability. If you feel dizzy or unstable, sit down right away.  Return to your normal activities as told by your health care provider. Ask your health care provider what activities are safe for you. General Instructions  Take over-the-counter and prescription medicines only as told by your health care provider.  Avoid certain positions or movements as told by your health  care provider.  Drink enough fluid to keep your urine clear or pale yellow.  Keep all follow-up visits as told by your health care provider. This is important. SEEK MEDICAL CARE IF:  You have a fever.  Your condition gets worse or you develop new symptoms.  Your family or  friends notice any behavioral changes.  Your nausea or vomiting gets worse.  You have numbness or a "pins and needles" sensation. SEEK IMMEDIATE MEDICAL CARE IF:  You have difficulty speaking or moving.  You are always dizzy.  You faint.  You develop severe headaches.  You have weakness in your legs or arms.  You have changes in your hearing or vision.  You develop a stiff neck.  You develop sensitivity to light.   This information is not intended to replace advice given to you by your health care provider. Make sure you discuss any questions you have with your health care provider.   Document Released: 12/30/2005 Document Revised: 12/13/2014 Document Reviewed: 07/17/2014 Elsevier Interactive Patient Education [employer].

## 2015-06-27 NOTE — ED Provider Notes (Signed)
CSN: [id number]     Arrival date & time 06/27/15  1902 History   First MD Initiated Contact with Patient 06/27/15 1928     Chief Complaint  Patient presents with  . Dizziness     (Consider location/radiation/quality/duration/timing/severity/associated sxs/prior Treatment) Patient is a 50 y.o. female presenting with dizziness. The history is provided by the patient and medical records.  Dizziness Associated symptoms: nausea and vomiting     50 year old female with history of hypertension, migraine headaches, IBS, GERD, headaches, ovarian cancer currently on chemotherapy, presenting to the ED for dizziness. Patient states she has not been feeling well since her last chemotherapy treatment on March 16. She states generally she is fatigued for approximately 1 week afterwards, she states she is not quite yet returned back to normal. She states last night she began feeling very dizzy which she describes as a room spinning sensation, more pronounced when she stands up or moves her head. She states symptoms are better with sitting still. States her dizziness is making her nauseated, she's not had any emesis since around 1700 this evening. She did have some improvement with her Zofran that she took at home. Patient denies any numbness or weakness of her extremities. No headache or visual disturbance. She denies any confusion, fever, or chills.  No chest pain or SOB.  Patient denies any history of known brain metastases. She is followed by Dr. Marko Plume.  She is currently on Neulasta and Decadron.  VSS.  Past Medical History  Diagnosis Date  . Hypertension   . Hx of migraines   . IBS (irritable bowel syndrome)   . Diverticulitis with perforation 2010  . Family history of adverse reaction to anesthesia     pts daughter had difficulty awakening following anesthesia   . History of bronchitis   . History of ear infections   . Kidney infection   . GERD (gastroesophageal reflux disease)   . Headache   .  Morbid obesity ([place])   . Anemia   . Pyelonephritis   . Cough   . Ovarian cancer ([place])   . Endometrial cancer [facility])    Past Surgical History  Procedure Laterality Date  . Colostomy takedown    . Colostomy  2008  . Cesarean section  1983 and 1984  . Tonsillectomy and adenoidectomy  1997  . Laparotomy N/A 03/13/2015    Procedure: EXPLORATORY LAPAROTOMY;  Surgeon: Everitt Amber, MD;  Location: [facility];  Service: Gynecology;  Laterality: N/A;  . Abdominal hysterectomy N/A 03/13/2015    Procedure: TOTAL HYSTERECTOMY ABDOMINAL BILATERAL SALPINGO OOPHORECTOMY RADICAL TUMOR [place];  Surgeon: Everitt Amber, MD;  Location: [facility];  Service: Gynecology;  Laterality: N/A;  . Bowel resection  03/13/2015    Procedure: SMALL BOWEL RESECTION;  Surgeon: Everitt Amber, MD;  Location: [facility];  Service: Gynecology;;  . Ventral hernia repair  03/13/2015    Procedure: HERNIA REPAIR VENTRAL ADULT;  Surgeon: Everitt Amber, MD;  Location: [facility];  Service: Gynecology;;   Family History  Problem Relation Age of Onset  . Diabetes Mother   . Hypertension Mother   . Hyperlipidemia Mother   . Hyperlipidemia Father   . Cancer Daughter 30    Hodgkins  . Diabetes Daughter   . Heart disease Neg Hx   . Stroke Neg Hx    Social History  Substance Use Topics  . Smoking status: Never Smoker   . Smokeless tobacco: Never Used  . Alcohol Use: 0.0 oz/week     Comment: Maybe  wine every 6 months   OB History    No data available     Review of Systems  Gastrointestinal: Positive for nausea and vomiting.  Neurological: Positive for dizziness.  All other systems reviewed and are negative.     Allergies  Penicillins  Home Medications   Prior to Admission medications   Medication Sig Start Date End Date Taking? Authorizing Provider  clobetasol cream (TEMOVATE) 0.05 % Apply topically 2 (two) times daily. Patient taking differently: Apply 1 application topically at bedtime as needed.  12/12/14   Alveda Reasons, MD   dexamethasone (DECADRON) 4 MG tablet Take 5 tabs with food 12 hrs and 6 hrs prior to taxol chemotherapy. 05/31/15   Lennis Marion Downer, MD  docusate sodium (COLACE) 100 MG capsule Take 100 mg by mouth daily.     Historical Provider, MD  ferrous fumarate (HEMOCYTE - 106 MG FE) 325 (106 Fe) MG TABS tablet Take 1 tablet daily on an empty stomach with OJ or Vitamin C tablet 04/04/15   Lennis P Livesay, MD  gabapentin (NEURONTIN) 100 MG capsule Take 1 capsule (100 mg total) by mouth at bedtime. Will make sleepy. 05/17/15   Lennis Marion Downer, MD  lactulose (CHRONULAC) 10 GM/15ML solution Take 30 mLs (20 g total) by mouth 2 (two) times daily as needed for mild constipation. In addition to miralax and colace 05/10/15   Lennis P Marko Plume, MD  lidocaine-prilocaine (EMLA) cream Apply to [facility] site 1-2 hours prior to access as needed. 03/30/15   Lennis Marion Downer, MD  loratadine (CLARITIN) 10 MG tablet Take 10 mg by mouth daily as needed for allergies.    Historical Provider, MD  LORazepam (ATIVAN) 1 MG tablet Place 1/2 to 1 tablet under the tongue or swallow every 6 hours as needed for nausea. Will make drowsy. 03/30/15   Lennis Marion Downer, MD  ondansetron (ZOFRAN) 8 MG tablet Take 1 tablet (8 mg total) by mouth every 8 (eight) hours as needed for nausea or vomiting (Will not make drowsy). 03/30/15   Lennis Marion Downer, MD  oxyCODONE (OXY IR/ROXICODONE) 5 MG immediate release tablet Take 1-2 tablets (5-10 mg total) by mouth every 4 (four) hours as needed for severe pain. 05/31/15   Lennis Marion Downer, MD  pantoprazole (PROTONIX) 40 MG tablet Take 1 tablet (40 mg total) by mouth daily. 05/10/15   Lennis Marion Downer, MD  polyethylene glycol (MIRALAX / GLYCOLAX) packet Take 17 g by mouth 2 (two) times daily as needed.    Historical Provider, MD   BP 143/89 mmHg  Pulse 108  Temp(Src) 98.9 F (37.2 C) (Oral)  Resp 20  SpO2 99%  LMP 10/16/2013 (Approximate)   Physical Exam  Constitutional: She is oriented to person, place, and  time. She appears well-developed and well-nourished. No distress.  Obese, NAD  HENT:  Head: Normocephalic and atraumatic.  Mouth/Throat: Oropharynx is clear and moist.  Eyes: Conjunctivae and EOM are normal. Pupils are equal, round, and reactive to light.  Neck: Normal range of motion. Neck supple.  Cardiovascular: Normal rate, regular rhythm and normal heart sounds.   Pulmonary/Chest: Effort normal and breath sounds normal. No respiratory distress. She has no wheezes.  Abdominal: Soft. Bowel sounds are normal. There is no tenderness. There is no guarding.  Musculoskeletal: Normal range of motion. She exhibits no edema.  Neurological: She is alert and oriented to person, place, and time.  AAOx3, answering questions appropriately; equal strength UE and LE bilaterally; CN grossly intact; moves  all extremities appropriately without ataxia; no focal neuro deficits or facial asymmetry appreciated; dizziness reproduced with movement of head  Skin: Skin is warm and dry. She is not diaphoretic.  Psychiatric: She has a normal mood and affect.  Nursing note and vitals reviewed.   ED Course  Procedures (including critical care time) Labs Review Labs Reviewed  CBC WITH DIFFERENTIAL/PLATELET - Abnormal; Notable for the following:    WBC 3.6 (*)    RBC 3.50 (*)    Hemoglobin 10.6 (*)    HCT 32.5 (*)    RDW 19.4 (*)    All other components within normal limits  BASIC METABOLIC PANEL - Abnormal; Notable for the following:    Glucose, Bld 135 (*)    Creatinine, Ser 1.14 (*)    GFR calc non Af Amer 55 (*)    All other components within normal limits    Imaging Review Ct Head Wo Contrast  06/27/2015  CLINICAL DATA:  Ovarian carcinoma.  Dizziness. EXAM: CT HEAD WITHOUT CONTRAST TECHNIQUE: Contiguous axial images were obtained from the base of the skull through the vertex without intravenous contrast. COMPARISON:  September 10, 2009 FINDINGS: The ventricles are normal in size and configuration. There is  no intracranial mass, hemorrhage, extra-axial fluid collection, or midline shift. The gray-white compartments are normal. No acute infarct evident. Bony calvarium appears intact. The mastoid air cells are clear. No intraorbital lesions are apparent. IMPRESSION: Study within normal limits. Electronically Signed   By: Lowella Grip III M.D.   On: 06/27/2015 20:48   I have personally reviewed and evaluated these images and lab results as part of my medical decision-making.   EKG Interpretation None      MDM   Final diagnoses:  Dizziness  Vertigo   50 y.o. F with hx of ovarian cancer here with new onset dizziness last night.  Sx worse with movement, described as room spinning with N/V.  Patient awake, alert, fully oriented.  No deficits noted on exam.  Dizziness is reproducible with movement of head during exam.  VSS.  Denies chest pain or SOB.  No recent illness or fever.  Given hx of cancer, will obtain CT to ensure no malignant pathology, send basic labs. Patient treated with IVF, meclizine for now.  9:19 PM Labs appear baseline for patient when compared with prior values. CT head negative for acute findings.  After IVF and meclizine patient reports she feels back to normal.  She states she has been moving her head around in room, no further dizziness.  Patient ambulated in the hallway without difficulty.  Denies any dizziness when walking.  States she remains at baseline currently, VSS.  Suspect peripheral vertigo.  Will d/c home with script for meclizine.  Follow-up with PCP.  Discussed plan with patient, he/she acknowledged understanding and agreed with plan of care.  Return precautions given for new or worsening symptoms.  Larene Pickett, PA-C 06/27/15 [place], DO 06/28/15 [phone]

## 2015-06-27 NOTE — ED Notes (Signed)
Pt done good when ambulating

## 2015-06-27 NOTE — Telephone Encounter (Signed)
Spoke with patient.  She started experiencing lightheadedness and dizziness last night.  She ate something and vomited twice.  Today she is stil having lightheadedness, dizziness and nausea whenever she moves her head. The room feels like it is spinning.  When her head is still she is fine.  She has had a total of 32oz of water so far today.   Her son-in-law is there with her and has fixed her some food, but she has not eaten much (some noodles and an apple).   Her call back is [phone].  Her pharmacy is [facility] outpatient pharmacy.  Let her know that we would check with Dr. Marko Plume and her nurse.  Until this resolves she needs to make sure her son-in-law (or someone) is with her because she is at increased risk for falling.  She appreciated the call back.

## 2015-06-28 ENCOUNTER — Telehealth: Payer: Self-pay | Admitting: Oncology

## 2015-06-28 ENCOUNTER — Encounter: Payer: Self-pay | Admitting: Oncology

## 2015-06-28 ENCOUNTER — Other Ambulatory Visit (HOSPITAL_BASED_OUTPATIENT_CLINIC_OR_DEPARTMENT_OTHER): Payer: BLUE CROSS/BLUE SHIELD

## 2015-06-28 ENCOUNTER — Ambulatory Visit (HOSPITAL_BASED_OUTPATIENT_CLINIC_OR_DEPARTMENT_OTHER): Payer: BLUE CROSS/BLUE SHIELD | Admitting: Oncology

## 2015-06-28 VITALS — BP 142/88 | HR 107 | Temp 98.1°F | Resp 19 | Ht 65.0 in | Wt 336.2 lb

## 2015-06-28 DIAGNOSIS — G620 Drug-induced polyneuropathy: Secondary | ICD-10-CM

## 2015-06-28 DIAGNOSIS — C541 Malignant neoplasm of endometrium: Secondary | ICD-10-CM | POA: Diagnosis not present

## 2015-06-28 DIAGNOSIS — D509 Iron deficiency anemia, unspecified: Secondary | ICD-10-CM

## 2015-06-28 DIAGNOSIS — T451X5A Adverse effect of antineoplastic and immunosuppressive drugs, initial encounter: Secondary | ICD-10-CM

## 2015-06-28 DIAGNOSIS — C562 Malignant neoplasm of left ovary: Secondary | ICD-10-CM

## 2015-06-28 DIAGNOSIS — D6959 Other secondary thrombocytopenia: Secondary | ICD-10-CM

## 2015-06-28 DIAGNOSIS — C561 Malignant neoplasm of right ovary: Secondary | ICD-10-CM

## 2015-06-28 DIAGNOSIS — K219 Gastro-esophageal reflux disease without esophagitis: Secondary | ICD-10-CM

## 2015-06-28 DIAGNOSIS — D701 Agranulocytosis secondary to cancer chemotherapy: Secondary | ICD-10-CM

## 2015-06-28 DIAGNOSIS — N179 Acute kidney failure, unspecified: Secondary | ICD-10-CM | POA: Diagnosis not present

## 2015-06-28 DIAGNOSIS — Z95828 Presence of other vascular implants and grafts: Secondary | ICD-10-CM

## 2015-06-28 DIAGNOSIS — E860 Dehydration: Secondary | ICD-10-CM

## 2015-06-28 LAB — COMPREHENSIVE METABOLIC PANEL
ALT: 10 U/L (ref 0–55)
AST: 18 U/L (ref 5–34)
Albumin: 3.6 g/dL (ref 3.5–5.0)
Alkaline Phosphatase: 90 U/L (ref 40–150)
Anion Gap: 9 mEq/L (ref 3–11)
BUN: 16.4 mg/dL (ref 7.0–26.0)
CHLORIDE: 104 meq/L (ref 98–109)
CO2: 25 meq/L (ref 22–29)
Calcium: 9.8 mg/dL (ref 8.4–10.4)
Creatinine: 1.5 mg/dL — ABNORMAL HIGH (ref 0.6–1.1)
EGFR: 48 mL/min/{1.73_m2} — AB (ref >90)
GLUCOSE: 157 mg/dL — AB (ref 70–140)
POTASSIUM: 4.4 meq/L (ref 3.5–5.1)
SODIUM: 137 meq/L (ref 136–145)
Total Bilirubin: 0.31 mg/dL (ref 0.20–1.20)
Total Protein: 7.1 g/dL (ref 6.4–8.3)

## 2015-06-28 LAB — CBC WITH DIFFERENTIAL/PLATELET
BASO%: 0.5 % (ref 0.0–2.0)
BASOS ABS: 0 10*3/uL (ref 0.0–0.1)
EOS ABS: 0 10*3/uL (ref 0.0–0.5)
EOS%: 0.5 % (ref 0.0–7.0)
HEMATOCRIT: 32.3 % — AB (ref 34.8–46.6)
HEMOGLOBIN: 10.6 g/dL — AB (ref 11.6–15.9)
LYMPH#: 2.5 10*3/uL (ref 0.9–3.3)
LYMPH%: 26.3 % (ref 14.0–49.7)
MCH: 29.8 pg (ref 25.1–34.0)
MCHC: 32.8 g/dL (ref 31.5–36.0)
MCV: 91.1 fL (ref 79.5–101.0)
MONO#: 2.1 10*3/uL — ABNORMAL HIGH (ref 0.1–0.9)
MONO%: 22 % — ABNORMAL HIGH (ref 0.0–14.0)
NEUT#: 4.9 10*3/uL (ref 1.5–6.5)
NEUT%: 50.7 % (ref 38.4–76.8)
Platelets: 228 10*3/uL (ref 145–400)
RBC: 3.54 10*6/uL — ABNORMAL LOW (ref 3.70–5.45)
RDW: 21.6 % — AB (ref 11.2–14.5)
WBC: 9.6 10*3/uL (ref 3.9–10.3)

## 2015-06-28 MED FILL — MECLIZINE 25 MG TABLET: 25 | 10 days supply | Qty: 30 | Fill #0

## 2015-06-28 NOTE — Progress Notes (Signed)
OFFICE PROGRESS NOTE   June 28, 2015   Physicians:Alison Marda Stalker, MD (PCP),  INTERVAL HISTORY:  Patient is seen, alone for visit, in continuing attention to adjuvant chemotherapy in process for IIB carcinosarcoma of right ovary, having had cycle 4 carbo taxol on 06-21-15, with on pro neulasta. Cycle 4 was delayed x 1 week for thrombocytopenia, carbo dose also adjusted down for this reason.  Patient was seen at ED on 06-27-15 for acute vertigo with associated nausea and vomiting, those symptoms improved with IVF and meclizine in ED and resolved today.  CT head done without IV contrast had no acute abnormalities.  Additionally, she has had taxol and neulasta aches mostly in legs, fatigue, decreased appetite tho has had yogurt, applesauce and water today. She has had no further vomiting. Gabapentin 100 mg at hs has helped with peripheral neuropathy symptoms in feet, this mostly during the week after treatment (see medications below). No abdominal or pelvic pain. No bleeding. No fever. No other neurologic symptoms. No LE swelling, No increased SOB. No problems with PAC Remainder of 10 point Review of Systems negative.   CA 125 on 02-09-15 was  21.8 PAC by IR 04-11-15 Flu vaccine 12-12-14 Feraheme x1 on 06-14-15, not planning second dose but now off oral iron.  ONCOLOGIC HISTORY Patient had intermittent right lower quadrant abdominal pain since Aug 2016, with pelvic US 11-30-14 with uterine fibroids (11.2 x 4.8 x 6.8 cm. 1.7 x 1.7 x 2.0 cm subserosal fibroid in the right uterine body. 6.3 x 3.6 x 5.7 cm probable exophytic/pedunculated fibroid along the anterior uterine fundus), endometrium poorly visualized, right ovary poorly visualized but measured 4 x 2.8 x 4.4 cm and left ovary 4.9 x 3.4 x 4.9 cm with probable corpus luteal cyst called. Note patient is morbidly obese, with BMI >60. She had some fevers during that time. Patient was admitted to [facility]18-15 thru 02-21-15 with  pyelonephritis and acute renal failure (creatinine 12.4), both of those problems resolving entirely with treatment. CT AP and MRI pelvis done to evaluate the pyelonephritis demonstrated cystic and solid pelvic mass appearing to be arising from the right adnexa 13 x 9 x 17.5 cm, with a second cystic and solid lesion within the left adnexa measuring 4.4 x 6.5 x 5.3 cm. A third cystic and solid mass was seen anterior to the uterus and superior to the bladder measuring 4.2 x 5.4 x 5.3 cm. She had no pelvic lymphadenopathy, no carcinomatosis, and CA 125 was normal at 21 U/mL. She was seen in consultation by Dr [name], with exploratory laparotomy 03-23-15 with TAH BSO, partial small bowel resection and radical debulking, with additional ventral hernia repair. She was hospitalized with the surgery from 12-6 thru 03-16-15, discharged on prophylactic lovenox which she continues, no post op complications in hospital. [name] were removed on 03-27-15, some separation of wound with wound care continuing by patient at home. 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 She saw Dr [name] 03-29-15, with recommendation for adjuvant chemotherapy and follow up with gyn oncology expected after 6 cycles of chemotherapy. Cycle 1 carbo taxol given 04-12-15, carbo capped at 750 mg and taxol dosed at 175 mg/m2 using actual weight. She was neutropenic with ANCX 0.1 on day 12 cycle 1. Doses same for cycle 2, granix begun day 6, ANC 0.2 on day 8. On  pro neulasta used cycle 4, after dose delay and carbo decreased for thrombocytopenia.   Objective:  Vital signs in last 24 hours:  BP 142/88 mmHg  Pulse 107  Temp(Src) 98.1 F (36.7 C)  Resp 19  Ht '5\' 5"'  (1.651 m)  Wt 336 lb 3.2 oz (152.499 kg)  BMI 55.95 kg/m2  SpO2 100%  LMP 10/16/2013  (Approximate) Weight down 11 lbs.  Alert, oriented and appropriate. Ambulatory without assistance. Looks tired but not in acute discomfort. Respirations not labored RA Alopecia  HEENT:PERRL, sclerae not icteric. Oral mucosa moist without lesions, posterior pharynx clear. Neck supple. No JVD.  Lymphatics:no cervical,supraclavicular adenopathy Resp: clear to auscultation bilaterally  Cardio: regular rate and rhythm. No gallop. GI: abdomen obese, soft, nontender, not obviously distended, no appreciable mass or organomegaly. Normally active bowel sounds. Surgical incision not remarkable. Musculoskeletal/ Extremities: without pitting edema, cords, tenderness Neuro: peripheral neuropathy distal feet. Otherwise nonfocal. PSYCH appropriate mood and affect Skin without rash, ecchymosis, petechiae Portacath-without erythema or tenderness  Lab Results:  Results for orders placed or performed in visit on 06/28/15  CBC with Differential  Result Value Ref Range   WBC 9.6 3.9 - 10.3 10e3/uL   NEUT# 4.9 1.5 - 6.5 10e3/uL   HGB 10.6 (L) 11.6 - 15.9 g/dL   HCT 32.3 (L) 34.8 - 46.6 %   Platelets 228 145 - 400 10e3/uL   MCV 91.1 79.5 - 101.0 fL   MCH 29.8 25.1 - 34.0 pg   MCHC 32.8 31.5 - 36.0 g/dL   RBC 3.54 (L) 3.70 - 5.45 10e6/uL   RDW 21.6 (H) 11.2 - 14.5 %   lymph# 2.5 0.9 - 3.3 10e3/uL   MONO# 2.1 (H) 0.1 - 0.9 10e3/uL   Eosinophils Absolute 0.0 0.0 - 0.5 10e3/uL   Basophils Absolute 0.0 0.0 - 0.1 10e3/uL   NEUT% 50.7 38.4 - 76.8 %   LYMPH% 26.3 14.0 - 49.7 %   MONO% 22.0 (H) 0.0 - 14.0 %   EOS% 0.5 0.0 - 7.0 %   BASO% 0.5 0.0 - 2.0 %  Comprehensive metabolic panel  Result Value Ref Range   Sodium 137 136 - 145 mEq/L   Potassium 4.4 3.5 - 5.1 mEq/L   Chloride 104 98 - 109 mEq/L   CO2 25 22 - 29 mEq/L   Glucose 157 (H) 70 - 140 mg/dl   BUN 16.4 7.0 - 26.0 mg/dL   Creatinine 1.5 (H) 0.6 - 1.1 mg/dL   Total Bilirubin 0.31 0.20 - 1.20 mg/dL   Alkaline Phosphatase 90 40 - 150 U/L    AST 18 5 - 34 U/L   ALT 10 0 - 55 U/L   Total Protein 7.1 6.4 - 8.3 g/dL   Albumin 3.6 3.5 - 5.0 g/dL   Calcium 9.8 8.4 - 10.4 mg/dL   Anion Gap 9 3 - 11 mEq/L   EGFR 48 (L) >90 ml/min/1.73 m2    CA 125 available after visit 4.1, this having been 21.8 prior to surgery.  Studies/Results:  Ct Head Wo Contrast  06/27/2015  CLINICAL DATA:  Ovarian carcinoma.  Dizziness. EXAM: CT HEAD WITHOUT CONTRAST TECHNIQUE: Contiguous axial images were obtained from the base of the skull through the vertex without intravenous contrast. COMPARISON:  September 10, 2009 FINDINGS: The ventricles are normal in size and configuration. There is no intracranial mass, hemorrhage, extra-axial fluid collection, or midline shift. The gray-white compartments are normal. No acute infarct evident. Bony calvarium appears intact. The mastoid air cells are clear.  No intraorbital lesions are apparent. IMPRESSION: Study within normal limits. Electronically Signed   By: Lowella Grip III M.D.   On: 06/27/2015 20:48    Medications: I have reviewed the patient's current medications. OK to increase gabapentin to 200 - 300 mg at hs for several nights after each chemo if needed.  DISCUSSION Agree with ED that symptoms 3-22 appear to have been benign positional vertigo + dehydration. Strongly encouraged patient to push po fluids.  Gabapentin as above.  Will see her with labs on 07-09-15, cycle 5 due 07-12-15.  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 Pathology reviewed at [facility]. Taxol carboplatin begun 04-12-15. Significant taxol aches, however marked neutropenia with delays in gCSF including neulasta with cycle 3. Tolerated pro neulasta with cycle 4, following counts 2.synchronous IA grade 1 endometrioid adenocarcinoma of uterus, treated  surgically 3. PAC flipped 05-10-15, manipulated back into correct positio, follow up dye study fine. Peripheral IV access very difficult 4.morbid obesity, BMI 59. This obviously impacts tolerance for the anemia and increases risk for the gyn malignancy 5.chemo peripheral neuropathy: lots of movement and exercise, gabapentin at hs and can increase dose if needed. Not interfering with function and, with potential benefit from taxol at this point, we are in agreement with continuing chemo and following  6.benign positional vertigo resolved 7.acute renal failure with creatinine 12.4 at presentation with pyelonephritis, resolved. Creatinine better with better hydration. Dosing carbo as noted 8.overdue mammograms, which we will address when possible. 9.perforated diverticular disease with temporary colostomy 2008.  10.flu vaccine 12-12-14 11. no DVT by LE dopplers after 03-29-15. Completed post op prophylactic lovenox LE swelling improved.. 12. iron deficiency anemia: took oral iron prior to lab test today, previously documented lower, hemoglobin now 9.9 and more symptomatic. One dose feraheme 06-14-15, oral iron DCd.  f/u colonoscopy information 13.history of PCN allergy, which was not an anaphylactic reaction.  14.financial concerns: appreciate assistance from [facility]  15.increased GERD: now on protonix 16.chemo thrombocytopenia: cycle 4 delayed x 1 week + decrease in [place] dose. Do not expect that we will be able to increase carbo dose back.  All questions answered and she will call if needed prior to next scheduled appointment. Time spent 25 min including >50% counseling and coordination of care. Cc PCP   Gordy Levan, MD   06/28/2015, 5:57 PM

## 2015-06-28 NOTE — Telephone Encounter (Signed)
appt made and avs printed °

## 2015-06-29 ENCOUNTER — Other Ambulatory Visit: Payer: Self-pay | Admitting: Oncology

## 2015-06-29 LAB — CA 125: CANCER ANTIGEN (CA) 125: 4.1 U/mL (ref 0.0–38.1)

## 2015-07-02 ENCOUNTER — Other Ambulatory Visit: Payer: BLUE CROSS/BLUE SHIELD

## 2015-07-02 ENCOUNTER — Ambulatory Visit: Payer: BLUE CROSS/BLUE SHIELD | Admitting: Oncology

## 2015-07-08 ENCOUNTER — Other Ambulatory Visit: Payer: Self-pay | Admitting: Oncology

## 2015-07-09 ENCOUNTER — Other Ambulatory Visit: Payer: Self-pay

## 2015-07-09 ENCOUNTER — Encounter: Payer: Self-pay | Admitting: Oncology

## 2015-07-09 ENCOUNTER — Other Ambulatory Visit (HOSPITAL_BASED_OUTPATIENT_CLINIC_OR_DEPARTMENT_OTHER): Payer: BLUE CROSS/BLUE SHIELD

## 2015-07-09 ENCOUNTER — Ambulatory Visit (HOSPITAL_BASED_OUTPATIENT_CLINIC_OR_DEPARTMENT_OTHER): Payer: BLUE CROSS/BLUE SHIELD | Admitting: Oncology

## 2015-07-09 VITALS — BP 143/78 | HR 84 | Temp 98.0°F | Resp 18 | Ht 65.0 in | Wt 345.4 lb

## 2015-07-09 DIAGNOSIS — C562 Malignant neoplasm of left ovary: Secondary | ICD-10-CM

## 2015-07-09 DIAGNOSIS — K5900 Constipation, unspecified: Secondary | ICD-10-CM

## 2015-07-09 DIAGNOSIS — C541 Malignant neoplasm of endometrium: Secondary | ICD-10-CM

## 2015-07-09 DIAGNOSIS — G620 Drug-induced polyneuropathy: Secondary | ICD-10-CM

## 2015-07-09 DIAGNOSIS — N183 Chronic kidney disease, stage 3 unspecified: Secondary | ICD-10-CM

## 2015-07-09 DIAGNOSIS — C561 Malignant neoplasm of right ovary: Secondary | ICD-10-CM

## 2015-07-09 DIAGNOSIS — R112 Nausea with vomiting, unspecified: Secondary | ICD-10-CM

## 2015-07-09 DIAGNOSIS — N179 Acute kidney failure, unspecified: Secondary | ICD-10-CM

## 2015-07-09 DIAGNOSIS — Z95828 Presence of other vascular implants and grafts: Secondary | ICD-10-CM

## 2015-07-09 DIAGNOSIS — D590 Drug-induced autoimmune hemolytic anemia: Secondary | ICD-10-CM

## 2015-07-09 DIAGNOSIS — D6959 Other secondary thrombocytopenia: Secondary | ICD-10-CM

## 2015-07-09 DIAGNOSIS — G622 Polyneuropathy due to other toxic agents: Secondary | ICD-10-CM

## 2015-07-09 DIAGNOSIS — T451X5A Adverse effect of antineoplastic and immunosuppressive drugs, initial encounter: Secondary | ICD-10-CM

## 2015-07-09 DIAGNOSIS — Z6841 Body Mass Index (BMI) 40.0 and over, adult: Secondary | ICD-10-CM

## 2015-07-09 DIAGNOSIS — D701 Agranulocytosis secondary to cancer chemotherapy: Secondary | ICD-10-CM | POA: Diagnosis not present

## 2015-07-09 LAB — CBC WITH DIFFERENTIAL/PLATELET
BASO%: 0.4 % (ref 0.0–2.0)
Basophils Absolute: 0 10*3/uL (ref 0.0–0.1)
EOS%: 0.4 % (ref 0.0–7.0)
Eosinophils Absolute: 0 10*3/uL (ref 0.0–0.5)
HEMATOCRIT: 28.9 % — AB (ref 34.8–46.6)
HEMOGLOBIN: 9.5 g/dL — AB (ref 11.6–15.9)
LYMPH#: 1.9 10*3/uL (ref 0.9–3.3)
LYMPH%: 16.7 % (ref 14.0–49.7)
MCH: 29.9 pg (ref 25.1–34.0)
MCHC: 32.8 g/dL (ref 31.5–36.0)
MCV: 91.2 fL (ref 79.5–101.0)
MONO#: 0.8 10*3/uL (ref 0.1–0.9)
MONO%: 7.4 % (ref 0.0–14.0)
NEUT#: 8.5 10*3/uL — ABNORMAL HIGH (ref 1.5–6.5)
NEUT%: 75.1 % (ref 38.4–76.8)
PLATELETS: 112 10*3/uL — AB (ref 145–400)
RBC: 3.17 10*6/uL — ABNORMAL LOW (ref 3.70–5.45)
RDW: 22.5 % — AB (ref 11.2–14.5)
WBC: 11.3 10*3/uL — ABNORMAL HIGH (ref 3.9–10.3)

## 2015-07-09 LAB — COMPREHENSIVE METABOLIC PANEL
AST: 11 U/L (ref 5–34)
Albumin: 3.3 g/dL — ABNORMAL LOW (ref 3.5–5.0)
Alkaline Phosphatase: 80 U/L (ref 40–150)
Anion Gap: 7 mEq/L (ref 3–11)
BUN: 26.3 mg/dL — ABNORMAL HIGH (ref 7.0–26.0)
CALCIUM: 9.8 mg/dL (ref 8.4–10.4)
CHLORIDE: 109 meq/L (ref 98–109)
CO2: 26 mEq/L (ref 22–29)
CREATININE: 1.2 mg/dL — AB (ref 0.6–1.1)
EGFR: 62 mL/min/{1.73_m2} — ABNORMAL LOW (ref >90)
Glucose: 108 mg/dl (ref 70–140)
Potassium: 4.8 mEq/L (ref 3.5–5.1)
Sodium: 142 mEq/L (ref 136–145)
Total Bilirubin: <0.3 mg/dL (ref 0.20–1.20)
Total Protein: 6.8 g/dL (ref 6.4–8.3)

## 2015-07-09 MED ORDER — DEXAMETHASONE 4 MG PO TABS: ORAL_TABLET | ORAL | Status: DC

## 2015-07-09 NOTE — Progress Notes (Signed)
OFFICE PROGRESS NOTE   July 09, 2015   Physicians: Everitt Amber, Alveda Reasons, MD (PCP),  INTERVAL HISTORY:  Patient is seen, alone for visit, in continuing attention to adjuvant chemotherapy in process for IIB carcinosarcoma of right ovary. Cytopenias have been an issue with chemo, including delay of cycle 4 for thrombocytopenia; ANC now maintained with on pro neulasta. She is due cycle 5 on 07-12-15, platelets just 112 today.  Patient has felt much better for last 3-4 days. Acute benign positional vertigo resolved mostly after 2 days of meclizine from ED, then completely resolved after 4 days. She has no nausea or vomiting now and bowels are moving regularly. She has minimal peripheral neuropathy symptoms tips of fingers and distal feet, not bothersome. She denies increased SOB, abdominal or pelvic pain, bladder symptoms, LE swelling, any bleeding. Energy was good enough to go out for several hours this weekend with friend, which she enjoyed very much. No fever or symptoms of infection. Remainder of 10 point Review of Systems negative.   She plans to begin weight loss program after chemo completes, will do individual instruction at a weight loss "boot camp". I have talked with her about GOG 0225, which she would also be very much interested in considering if NED at completion of adjuvant treatment.   PAC by IR 04-11-15 Flu vaccine 12-12-14 Feraheme single dose 06-14-15 as iron studies only slightly decreased, now off oral iron which she did not tolerate.    ONCOLOGIC HISTORY Patient had intermittent right lower quadrant abdominal pain since Aug 2016, with pelvic US 11-30-14 with uterine fibroids (11.2 x 4.8 x 6.8 cm. 1.7 x 1.7 x 2.0 cm subserosal fibroid in the right uterine body. 6.3 x 3.6 x 5.7 cm probable exophytic/pedunculated fibroid along the anterior uterine fundus), endometrium poorly visualized, right ovary poorly visualized but measured 4 x 2.8 x 4.4 cm and left ovary 4.9 x 3.4 x 4.9 cm  with probable corpus luteal cyst called. Note patient is morbidly obese, with BMI >60. She had some fevers during that time. Patient was admitted to [facility] 02-18-15 thru 02-21-15 with pyelonephritis and acute renal failure (creatinine 12.4), both of those problems resolving entirely with treatment. CT AP and MRI pelvis done to evaluate the pyelonephritis demonstrated cystic and solid pelvic mass appearing to be arising from the right adnexa 13 x 9 x 17.5 cm, with a second cystic and solid lesion within the left adnexa measuring 4.4 x 6.5 x 5.3 cm. A third cystic and solid mass was seen anterior to the uterus and superior to the bladder measuring 4.2 x 5.4 x 5.3 cm. She had no pelvic lymphadenopathy, no carcinomatosis, and CA 125 was normal at 21 U/mL. She was seen in consultation by Dr Denman George, with exploratory laparotomy 03-23-15 with TAH BSO, partial small bowel resection and radical debulking, with additional ventral hernia repair. She was hospitalized with the surgery from 12-6 thru 03-16-15, discharged on prophylactic lovenox which she continues, no post op complications in hospital. Jodell Cipro were removed on 03-27-15, some separation of wound with wound care continuing by patient at home. 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 She saw Dr Denman George 03-29-15, with recommendation for adjuvant chemotherapy and follow up with gyn oncology expected after 6 cycles of chemotherapy. Cycle 1 carbo taxol given 04-12-15, carbo capped at 750 mg and taxol dosed  at 175 mg/m2 using actual weight. She was neutropenic with ANC 0.1 on day 12 cycle 1. Doses same for cycle 2, granix begun day 6, ANC 0.2 on day 8.   Objective:  Vital signs in last 24 hours:  BP 143/78 mmHg  Pulse 84  Temp(Src) 98 F (36.7 C) (Oral)  Resp 18  Ht '5\' 5"'   (1.651 m)  Wt 345 lb 6.4 oz (156.672 kg)  BMI 57.48 kg/m2  SpO2 100%  LMP 10/16/2013 (Approximate) Weight up 9 lbs Alert, oriented and appropriate. Ambulatory slowly due to obesity but without assistance, able to get on and off exam table with effort. Alopecia  HEENT:PERRL, sclerae not icteric. Oral mucosa moist without lesions, posterior pharynx clear.  Neck supple. No JVD.  Lymphatics:no supraclavicula adenopathy Resp: clear to auscultation bilaterally and normal percussion bilaterally Cardio: regular rate and rhythm. No gallop. GI: soft, nontender, not distended, no mass or organomegaly. Normally active bowel sounds. Surgical incision not remarkable. Musculoskeletal/ Extremities: without pitting edema, cords, tenderness Neuro: no significant peripheral neuropathy. Otherwise nonfocal. PSYCH appropriate mood and affect Skin without rash, ecchymosis, petechiae Portacath-without erythema or tenderness, pal  Lab Results:  Results for orders placed or performed in visit on 07/09/15  CBC with Differential  Result Value Ref Range   WBC 11.3 (H) 3.9 - 10.3 10e3/uL   NEUT# 8.5 (H) 1.5 - 6.5 10e3/uL   HGB 9.5 (L) 11.6 - 15.9 g/dL   HCT 28.9 (L) 34.8 - 46.6 %   Platelets 112 (L) 145 - 400 10e3/uL   MCV 91.2 79.5 - 101.0 fL   MCH 29.9 25.1 - 34.0 pg   MCHC 32.8 31.5 - 36.0 g/dL   RBC 3.17 (L) 3.70 - 5.45 10e6/uL   RDW 22.5 (H) 11.2 - 14.5 %   lymph# 1.9 0.9 - 3.3 10e3/uL   MONO# 0.8 0.1 - 0.9 10e3/uL   Eosinophils Absolute 0.0 0.0 - 0.5 10e3/uL   Basophils Absolute 0.0 0.0 - 0.1 10e3/uL   NEUT% 75.1 38.4 - 76.8 %   LYMPH% 16.7 14.0 - 49.7 %   MONO% 7.4 0.0 - 14.0 %   EOS% 0.4 0.0 - 7.0 %   BASO% 0.4 0.0 - 2.0 %  Comprehensive metabolic panel  Result Value Ref Range   Sodium 142 136 - 145 mEq/L   Potassium 4.8 3.5 - 5.1 mEq/L   Chloride 109 98 - 109 mEq/L   CO2 26 22 - 29 mEq/L   Glucose 108 70 - 140 mg/dl   BUN 26.3 (H) 7.0 - 26.0 mg/dL   Creatinine 1.2 (H) 0.6 - 1.1 mg/dL    Total Bilirubin <0.30 0.20 - 1.20 mg/dL   Alkaline Phosphatase 80 40 - 150 U/L   AST 11 5 - 34 U/L   ALT <9 0 - 55 U/L   Total Protein 6.8 6.4 - 8.3 g/dL   Albumin 3.3 (L) 3.5 - 5.0 g/dL   Calcium 9.8 8.4 - 10.4 mg/dL   Anion Gap 7 3 - 11 mEq/L   EGFR 62 (L) >90 ml/min/1.73 m2   CA 125 on 06-28-15   4.1  Studies/Results:  No results found.  Medications: I have reviewed the patient's current medications. Refill decadron  DISCUSSION Interval history, weight loss considerations, possible GOG 0225 discussed as above.  Will recheck CBC on 07-12-15, treat if ANC >=1.5 and plt >=110k. Will not try to increase carboplatin with cycle 5 due to platelets, have not been able to use actual weight with chemo dosing due  to counts not tolerating.   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 Pathology reviewed at [facility]. Taxol carboplatin begun 04-12-15. Significant taxol aches, however marked neutropenia with delays in gCSF, best WBC response with on pro neulasta used cycle 4 and will continue. Unable to use actual weight for dosing due to cytopenias. Keep carbo at AUC =4 for cycle 5 with platelets just over 100k today, confirm counts day of treatment 07-12-15.  2.synchronous IA grade 1 endometrioid adenocarcinoma of uterus, treated surgically 3. PAC flipped 05-10-15, manipulated back into correct positio, follow up dye study fine. Peripheral IV access very difficult 4.morbid obesity, BMI 59. This obviously impacts tolerance for the anemia and increases risk for the gyn malignancy. Encouraged plans for weight loss. 5.chemo peripheral neuropathy: lots of movement and exercise, gabapentin at hs. Not interfering with function and, with potential benefit from taxol at this point, we are in agreement with continuing chemo and following  6.uncomfortable  constipation x days despite increasing laxatives starting day before chemo, tho not quite as severe this cycle. Increase lactulose to tid in addition to miralax and colace prn. Try magnesium citrate day after chemo if no BM then. 7.acute renal failure with creatinine 12.4 at presentation with pyelonephritis, resolved. Creatinine better  with better hydration. Dosing carbo as noted 8.overdue mammograms, which we will address when possible. 9.perforated diverticular disease with temporary colostomy 2008.  10..flu vaccine 12-12-14 [email or web address]  iron deficiency anemia post one dose IV feraheme, did not tolerate oral iron 12.history of PCN allergy, which was not an anaphylactic reaction.  13.benign positional vertigo after cycle 4, evaluated in ED, resolved.  All questions answered. Chemo and neulasta orders confirmed. Time spent 25 min including >50% counseling and coordination of care. Route to PCP     Gordy Levan, MD   07/09/2015, 12:34 PM

## 2015-07-11 ENCOUNTER — Other Ambulatory Visit: Payer: Self-pay

## 2015-07-11 DIAGNOSIS — C541 Malignant neoplasm of endometrium: Secondary | ICD-10-CM

## 2015-07-11 DIAGNOSIS — C561 Malignant neoplasm of right ovary: Secondary | ICD-10-CM

## 2015-07-11 MED ORDER — DEXAMETHASONE 4 MG PO TABS: ORAL_TABLET | ORAL | Status: DC

## 2015-07-11 MED FILL — DEXAMETHASONE 4 MG TABLET: 4 | 14 days supply | Qty: 20 | Fill #0

## 2015-07-11 NOTE — Telephone Encounter (Signed)
Called in refill for 20 tabs to [facility] as patient has not picked up medication.

## 2015-07-11 NOTE — Telephone Encounter (Signed)
Patient Demographics     Patient Name Sex DOB SSN Address Phone    [name], [name] Female [birth year] [id number] [address] [place] [address] [phone] [facility]) [phone] (Mobile)       Message  Received: Today    Lennis Marion Downer, MD  Baruch Merl, RN           Not sure if correct amount of decadron sent on last script - ok to give enough for last 2 cycles if not picked up yet for chemo 4-6, if not correct #.  If already picked up, we will just figure it out prior to last treatment   thanks

## 2015-07-12 ENCOUNTER — Ambulatory Visit (HOSPITAL_BASED_OUTPATIENT_CLINIC_OR_DEPARTMENT_OTHER): Payer: BLUE CROSS/BLUE SHIELD

## 2015-07-12 ENCOUNTER — Other Ambulatory Visit (HOSPITAL_BASED_OUTPATIENT_CLINIC_OR_DEPARTMENT_OTHER): Payer: BLUE CROSS/BLUE SHIELD

## 2015-07-12 VITALS — BP 115/69 | HR 85 | Temp 98.6°F | Resp 18

## 2015-07-12 DIAGNOSIS — C561 Malignant neoplasm of right ovary: Secondary | ICD-10-CM | POA: Diagnosis not present

## 2015-07-12 DIAGNOSIS — C541 Malignant neoplasm of endometrium: Secondary | ICD-10-CM

## 2015-07-12 DIAGNOSIS — Z5111 Encounter for antineoplastic chemotherapy: Secondary | ICD-10-CM

## 2015-07-12 DIAGNOSIS — C562 Malignant neoplasm of left ovary: Secondary | ICD-10-CM

## 2015-07-12 LAB — CBC WITH DIFFERENTIAL/PLATELET
BASO%: 0.3 % (ref 0.0–2.0)
BASOS ABS: 0 10*3/uL (ref 0.0–0.1)
EOS ABS: 0 10*3/uL (ref 0.0–0.5)
EOS%: 0 % (ref 0.0–7.0)
HEMATOCRIT: 29.8 % — AB (ref 34.8–46.6)
HEMOGLOBIN: 9.8 g/dL — AB (ref 11.6–15.9)
LYMPH#: 0.7 10*3/uL — AB (ref 0.9–3.3)
LYMPH%: 5.8 % — ABNORMAL LOW (ref 14.0–49.7)
MCH: 30 pg (ref 25.1–34.0)
MCHC: 32.9 g/dL (ref 31.5–36.0)
MCV: 91.2 fL (ref 79.5–101.0)
MONO#: 0.1 10*3/uL (ref 0.1–0.9)
MONO%: 0.5 % (ref 0.0–14.0)
NEUT#: 11.4 10*3/uL — ABNORMAL HIGH (ref 1.5–6.5)
NEUT%: 93.4 % — AB (ref 38.4–76.8)
PLATELETS: 131 10*3/uL — AB (ref 145–400)
RBC: 3.27 10*6/uL — ABNORMAL LOW (ref 3.70–5.45)
RDW: 22.9 % — AB (ref 11.2–14.5)
WBC: 12.2 10*3/uL — ABNORMAL HIGH (ref 3.9–10.3)

## 2015-07-12 LAB — COMPREHENSIVE METABOLIC PANEL
ALBUMIN: 3.5 g/dL (ref 3.5–5.0)
ALK PHOS: 86 U/L (ref 40–150)
ALT: <9 U/L (ref 0–55)
ANION GAP: 9 meq/L (ref 3–11)
AST: 12 U/L (ref 5–34)
BUN: 34.2 mg/dL — AB (ref 7.0–26.0)
CALCIUM: 10.1 mg/dL (ref 8.4–10.4)
CHLORIDE: 107 meq/L (ref 98–109)
CO2: 23 mEq/L (ref 22–29)
Creatinine: 1.4 mg/dL — ABNORMAL HIGH (ref 0.6–1.1)
EGFR: 53 mL/min/{1.73_m2} — AB (ref >90)
Glucose: 178 mg/dl — ABNORMAL HIGH (ref 70–140)
Sodium: 139 mEq/L (ref 136–145)
Total Bilirubin: 0.32 mg/dL (ref 0.20–1.20)
Total Protein: 7.4 g/dL (ref 6.4–8.3)

## 2015-07-12 MED ORDER — SODIUM CHLORIDE 0.9 % IJ SOLN
10.0000 mL | INTRAMUSCULAR | Status: DC | PRN
  Administered 2015-07-12: 10 mL
  Filled 2015-07-12: qty 10

## 2015-07-12 MED ORDER — SODIUM CHLORIDE 0.9 % IV SOLN
175.0000 mg/m2 | Freq: Once | INTRAVENOUS | Status: AC
  Administered 2015-07-12: 492 mg via INTRAVENOUS
  Filled 2015-07-12: qty 82

## 2015-07-12 MED ORDER — SODIUM CHLORIDE 0.9 % IV SOLN
Freq: Once | INTRAVENOUS | Status: AC
  Administered 2015-07-12: 09:00:00 via INTRAVENOUS

## 2015-07-12 MED ORDER — FAMOTIDINE IN NACL 20-0.9 MG/50ML-% IV SOLN
INTRAVENOUS | Status: AC
  Filled 2015-07-12: qty 50

## 2015-07-12 MED ORDER — HEPARIN SOD (PORK) LOCK FLUSH 100 UNIT/ML IV SOLN
500.0000 [IU] | Freq: Once | INTRAVENOUS | Status: AC | PRN
  Administered 2015-07-12: 500 [IU]
  Filled 2015-07-12: qty 5

## 2015-07-12 MED ORDER — SODIUM CHLORIDE 0.9 % IV SOLN
600.0000 mg | Freq: Once | INTRAVENOUS | Status: AC
  Administered 2015-07-12: 600 mg via INTRAVENOUS
  Filled 2015-07-12: qty 60

## 2015-07-12 MED ORDER — DIPHENHYDRAMINE HCL 50 MG/ML IJ SOLN
INTRAMUSCULAR | Status: AC
  Filled 2015-07-12: qty 1

## 2015-07-12 MED ORDER — PEGFILGRASTIM 6 MG/0.6ML ~~LOC~~ PSKT
6.0000 mg | PREFILLED_SYRINGE | Freq: Once | SUBCUTANEOUS | Status: AC
  Administered 2015-07-12: 6 mg via SUBCUTANEOUS
  Filled 2015-07-12: qty 0.6

## 2015-07-12 MED ORDER — FAMOTIDINE IN NACL 20-0.9 MG/50ML-% IV SOLN
20.0000 mg | Freq: Once | INTRAVENOUS | Status: AC
  Administered 2015-07-12: 20 mg via INTRAVENOUS

## 2015-07-12 MED ORDER — SODIUM CHLORIDE 0.9 % IV SOLN
Freq: Once | INTRAVENOUS | Status: AC
  Administered 2015-07-12: 09:00:00 via INTRAVENOUS
  Filled 2015-07-12: qty 8

## 2015-07-12 MED ORDER — DIPHENHYDRAMINE HCL 50 MG/ML IJ SOLN
50.0000 mg | Freq: Once | INTRAMUSCULAR | Status: AC
  Administered 2015-07-12: 50 mg via INTRAVENOUS

## 2015-07-12 NOTE — Patient Instructions (Signed)
Carboplatin injection What is this medicine? CARBOPLATIN (KAR boe pla tin) is a chemotherapy drug. It targets fast dividing cells, like cancer cells, and causes these cells to die. This medicine is used to treat ovarian cancer and many other cancers. This medicine may be used for other purposes; ask your health care provider or pharmacist if you have questions. What should I tell my health care provider before I take this medicine? They need to know if you have any of these conditions: -blood disorders -hearing problems -kidney disease -recent or ongoing radiation therapy -an unusual or allergic reaction to carboplatin, cisplatin, other chemotherapy, other medicines, foods, dyes, or preservatives -pregnant or trying to get pregnant -breast-feeding How should I use this medicine? This drug is usually given as an infusion into a vein. It is administered in a hospital or clinic by a specially trained health care professional. Talk to your pediatrician regarding the use of this medicine in children. Special care may be needed. Overdosage: If you think you have taken too much of this medicine contact a poison control center or emergency room at once. NOTE: This medicine is only for you. Do not share this medicine with others. What if I miss a dose? It is important not to miss a dose. Call your doctor or health care professional if you are unable to keep an appointment. What may interact with this medicine? -medicines for seizures -medicines to increase blood counts like filgrastim, pegfilgrastim, sargramostim -some antibiotics like amikacin, gentamicin, neomycin, streptomycin, tobramycin -vaccines Talk to your doctor or health care professional before taking any of these medicines: -acetaminophen -aspirin -ibuprofen -ketoprofen -naproxen This list may not describe all possible interactions. Give your health care provider a list of all the medicines, herbs, non-prescription drugs, or dietary  supplements you use. Also tell them if you smoke, drink alcohol, or use illegal drugs. Some items may interact with your medicine. What should I watch for while using this medicine? Your condition will be monitored carefully while you are receiving this medicine. You will need important blood work done while you are taking this medicine. This drug may make you feel generally unwell. This is not uncommon, as chemotherapy can affect healthy cells as well as cancer cells. Report any side effects. Continue your course of treatment even though you feel ill unless your doctor tells you to stop. In some cases, you may be given additional medicines to help with side effects. Follow all directions for their use. Call your doctor or health care professional for advice if you get a fever, chills or sore throat, or other symptoms of a cold or flu. Do not treat yourself. This drug decreases your body's ability to fight infections. Try to avoid being around people who are sick. This medicine may increase your risk to bruise or bleed. Call your doctor or health care professional if you notice any unusual bleeding. Be careful brushing and flossing your teeth or using a toothpick because you may get an infection or bleed more easily. If you have any dental work done, tell your dentist you are receiving this medicine. Avoid taking products that contain aspirin, acetaminophen, ibuprofen, naproxen, or ketoprofen unless instructed by your doctor. These medicines may hide a fever. Do not become pregnant while taking this medicine. Women should inform their doctor if they wish to become pregnant or think they might be pregnant. There is a potential for serious side effects to an unborn child. Talk to your health care professional or pharmacist for more information.   Do not breast-feed an infant while taking this medicine. What side effects may I notice from receiving this medicine? Side effects that you should report to your  doctor or health care professional as soon as possible: -allergic reactions like skin rash, itching or hives, swelling of the face, lips, or tongue -signs of infection - fever or chills, cough, sore throat, pain or difficulty passing urine -signs of decreased platelets or bleeding - bruising, pinpoint red spots on the skin, black, tarry stools, nosebleeds -signs of decreased red blood cells - unusually weak or tired, fainting spells, lightheadedness -breathing problems -changes in hearing -changes in vision -chest pain -high blood pressure -low blood counts - This drug may decrease the number of white blood cells, red blood cells and platelets. You may be at increased risk for infections and bleeding. -nausea and vomiting -pain, swelling, redness or irritation at the injection site -pain, tingling, numbness in the hands or feet -problems with balance, talking, walking -trouble passing urine or change in the amount of urine Side effects that usually do not require medical attention (report to your doctor or health care professional if they continue or are bothersome): -hair loss -loss of appetite -metallic taste in the mouth or changes in taste This list may not describe all possible side effects. Call your doctor for medical advice about side effects. You may report side effects to FDA at 1-800-FDA-1088. Where should I keep my medicine? This drug is given in a hospital or clinic and will not be stored at home. NOTE: This sheet is a summary. It may not cover all possible information. If you have questions about this medicine, talk to your doctor, pharmacist, or health care provider.    2016, Elsevier/Gold Standard. (2007-06-29 14:38:05) Paclitaxel injection What is this medicine? PACLITAXEL (PAK li TAX el) is a chemotherapy drug. It targets fast dividing cells, like cancer cells, and causes these cells to die. This medicine is used to treat ovarian cancer, breast cancer, and other  cancers. This medicine may be used for other purposes; ask your health care provider or pharmacist if you have questions. What should I tell my health care provider before I take this medicine? They need to know if you have any of these conditions: -blood disorders -irregular heartbeat -infection (especially a virus infection such as chickenpox, cold sores, or herpes) -liver disease -previous or ongoing radiation therapy -an unusual or allergic reaction to paclitaxel, alcohol, polyoxyethylated castor oil, other chemotherapy agents, other medicines, foods, dyes, or preservatives -pregnant or trying to get pregnant -breast-feeding How should I use this medicine? This drug is given as an infusion into a vein. It is administered in a hospital or clinic by a specially trained health care professional. Talk to your pediatrician regarding the use of this medicine in children. Special care may be needed. Overdosage: If you think you have taken too much of this medicine contact a poison control center or emergency room at once. NOTE: This medicine is only for you. Do not share this medicine with others. What if I miss a dose? It is important not to miss your dose. Call your doctor or health care professional if you are unable to keep an appointment. What may interact with this medicine? Do not take this medicine with any of the following medications: -disulfiram -metronidazole This medicine may also interact with the following medications: -cyclosporine -diazepam -ketoconazole -medicines to increase blood counts like filgrastim, pegfilgrastim, sargramostim -other chemotherapy drugs like cisplatin, doxorubicin, epirubicin, etoposide, teniposide, vincristine -quinidine -testosterone -  vaccines -verapamil Talk to your doctor or health care professional before taking any of these medicines: -acetaminophen -aspirin -ibuprofen -ketoprofen -naproxen This list may not describe all possible  interactions. Give your health care provider a list of all the medicines, herbs, non-prescription drugs, or dietary supplements you use. Also tell them if you smoke, drink alcohol, or use illegal drugs. Some items may interact with your medicine. What should I watch for while using this medicine? Your condition will be monitored carefully while you are receiving this medicine. You will need important blood work done while you are taking this medicine. This drug may make you feel generally unwell. This is not uncommon, as chemotherapy can affect healthy cells as well as cancer cells. Report any side effects. Continue your course of treatment even though you feel ill unless your doctor tells you to stop. This medicine can cause serious allergic reactions. To reduce your risk you will need to take other medicine(s) before treatment with this medicine. In some cases, you may be given additional medicines to help with side effects. Follow all directions for their use. Call your doctor or health care professional for advice if you get a fever, chills or sore throat, or other symptoms of a cold or flu. Do not treat yourself. This drug decreases your body's ability to fight infections. Try to avoid being around people who are sick. This medicine may increase your risk to bruise or bleed. Call your doctor or health care professional if you notice any unusual bleeding. Be careful brushing and flossing your teeth or using a toothpick because you may get an infection or bleed more easily. If you have any dental work done, tell your dentist you are receiving this medicine. Avoid taking products that contain aspirin, acetaminophen, ibuprofen, naproxen, or ketoprofen unless instructed by your doctor. These medicines may hide a fever. Do not become pregnant while taking this medicine. Women should inform their doctor if they wish to become pregnant or think they might be pregnant. There is a potential for serious side  effects to an unborn child. Talk to your health care professional or pharmacist for more information. Do not breast-feed an infant while taking this medicine. Men are advised not to father a child while receiving this medicine. This product may contain alcohol. Ask your pharmacist or healthcare provider if this medicine contains alcohol. Be sure to tell all healthcare providers you are taking this medicine. Certain medicines, like metronidazole and disulfiram, can cause an unpleasant reaction when taken with alcohol. The reaction includes flushing, headache, nausea, vomiting, sweating, and increased thirst. The reaction can last from 30 minutes to several hours. What side effects may I notice from receiving this medicine? Side effects that you should report to your doctor or health care professional as soon as possible: -allergic reactions like skin rash, itching or hives, swelling of the face, lips, or tongue -low blood counts - This drug may decrease the number of white blood cells, red blood cells and platelets. You may be at increased risk for infections and bleeding. -signs of infection - fever or chills, cough, sore throat, pain or difficulty passing urine -signs of decreased platelets or bleeding - bruising, pinpoint red spots on the skin, black, tarry stools, nosebleeds -signs of decreased red blood cells - unusually weak or tired, fainting spells, lightheadedness -breathing problems -chest pain -high or low blood pressure -mouth sores -nausea and vomiting -pain, swelling, redness or irritation at the injection site -pain, tingling, numbness in the hands or   feet -slow or irregular heartbeat -swelling of the ankle, feet, hands Side effects that usually do not require medical attention (report to your doctor or health care professional if they continue or are bothersome): -bone pain -complete hair loss including hair on your head, underarms, pubic hair, eyebrows, and eyelashes -changes in  the color of fingernails -diarrhea -loosening of the fingernails -loss of appetite -muscle or joint pain -red flush to skin -sweating This list may not describe all possible side effects. Call your doctor for medical advice about side effects. You may report side effects to FDA at 1-800-FDA-1088. Where should I keep my medicine? This drug is given in a hospital or clinic and will not be stored at home. NOTE: This sheet is a summary. It may not cover all possible information. If you have questions about this medicine, talk to your doctor, pharmacist, or health care provider.    2016, Elsevier/Gold Standard. (2014-11-09 13:02:56)  

## 2015-07-15 ENCOUNTER — Other Ambulatory Visit: Payer: Self-pay | Admitting: Oncology

## 2015-07-16 ENCOUNTER — Encounter: Payer: Self-pay | Admitting: Oncology

## 2015-07-16 NOTE — Progress Notes (Signed)
recd frm dr. Marko Plume- let patient know forms are ready for pick up with ms wilma 07/19/15

## 2015-07-19 ENCOUNTER — Telehealth: Payer: Self-pay | Admitting: Oncology

## 2015-07-19 ENCOUNTER — Other Ambulatory Visit (HOSPITAL_BASED_OUTPATIENT_CLINIC_OR_DEPARTMENT_OTHER): Payer: BLUE CROSS/BLUE SHIELD

## 2015-07-19 ENCOUNTER — Ambulatory Visit (HOSPITAL_BASED_OUTPATIENT_CLINIC_OR_DEPARTMENT_OTHER): Payer: BLUE CROSS/BLUE SHIELD | Admitting: Oncology

## 2015-07-19 ENCOUNTER — Encounter: Payer: Self-pay | Admitting: Oncology

## 2015-07-19 VITALS — BP 104/66 | HR 119 | Temp 98.2°F | Resp 120 | Ht 65.0 in | Wt 334.6 lb

## 2015-07-19 DIAGNOSIS — C541 Malignant neoplasm of endometrium: Secondary | ICD-10-CM

## 2015-07-19 DIAGNOSIS — C562 Malignant neoplasm of left ovary: Secondary | ICD-10-CM | POA: Diagnosis not present

## 2015-07-19 DIAGNOSIS — T451X5A Adverse effect of antineoplastic and immunosuppressive drugs, initial encounter: Secondary | ICD-10-CM

## 2015-07-19 DIAGNOSIS — D701 Agranulocytosis secondary to cancer chemotherapy: Secondary | ICD-10-CM | POA: Diagnosis not present

## 2015-07-19 DIAGNOSIS — C561 Malignant neoplasm of right ovary: Secondary | ICD-10-CM

## 2015-07-19 DIAGNOSIS — D6481 Anemia due to antineoplastic chemotherapy: Secondary | ICD-10-CM

## 2015-07-19 DIAGNOSIS — N183 Chronic kidney disease, stage 3 unspecified: Secondary | ICD-10-CM

## 2015-07-19 DIAGNOSIS — G622 Polyneuropathy due to other toxic agents: Secondary | ICD-10-CM

## 2015-07-19 DIAGNOSIS — D509 Iron deficiency anemia, unspecified: Secondary | ICD-10-CM

## 2015-07-19 DIAGNOSIS — R112 Nausea with vomiting, unspecified: Secondary | ICD-10-CM

## 2015-07-19 DIAGNOSIS — Z95828 Presence of other vascular implants and grafts: Secondary | ICD-10-CM

## 2015-07-19 DIAGNOSIS — D6959 Other secondary thrombocytopenia: Secondary | ICD-10-CM

## 2015-07-19 DIAGNOSIS — K5909 Other constipation: Secondary | ICD-10-CM

## 2015-07-19 DIAGNOSIS — K219 Gastro-esophageal reflux disease without esophagitis: Secondary | ICD-10-CM

## 2015-07-19 DIAGNOSIS — G620 Drug-induced polyneuropathy: Secondary | ICD-10-CM

## 2015-07-19 LAB — CBC WITH DIFFERENTIAL/PLATELET
BASO%: 1 % (ref 0.0–2.0)
BASOS ABS: 0 10*3/uL (ref 0.0–0.1)
EOS ABS: 0.1 10*3/uL (ref 0.0–0.5)
EOS%: 2.3 % (ref 0.0–7.0)
HEMATOCRIT: 28.8 % — AB (ref 34.8–46.6)
HEMOGLOBIN: 9.5 g/dL — AB (ref 11.6–15.9)
LYMPH#: 1.6 10*3/uL (ref 0.9–3.3)
LYMPH%: 56.4 % — ABNORMAL HIGH (ref 14.0–49.7)
MCH: 30.7 pg (ref 25.1–34.0)
MCHC: 32.9 g/dL (ref 31.5–36.0)
MCV: 93.3 fL (ref 79.5–101.0)
MONO#: 0.4 10*3/uL (ref 0.1–0.9)
MONO%: 14.3 % — ABNORMAL HIGH (ref 0.0–14.0)
NEUT%: 26 % — ABNORMAL LOW (ref 38.4–76.8)
NEUTROS ABS: 0.7 10*3/uL — AB (ref 1.5–6.5)
Platelets: 68 10*3/uL — ABNORMAL LOW (ref 145–400)
RBC: 3.09 10*6/uL — ABNORMAL LOW (ref 3.70–5.45)
RDW: 22.8 % — AB (ref 11.2–14.5)
WBC: 2.8 10*3/uL — AB (ref 3.9–10.3)

## 2015-07-19 LAB — COMPREHENSIVE METABOLIC PANEL
ALBUMIN: 3.7 g/dL (ref 3.5–5.0)
ALK PHOS: 87 U/L (ref 40–150)
ALT: <9 U/L (ref 0–55)
AST: 13 U/L (ref 5–34)
Anion Gap: 10 mEq/L (ref 3–11)
BUN: 22.4 mg/dL (ref 7.0–26.0)
CALCIUM: 10.2 mg/dL (ref 8.4–10.4)
CO2: 24 mEq/L (ref 22–29)
Chloride: 106 mEq/L (ref 98–109)
Creatinine: 1.3 mg/dL — ABNORMAL HIGH (ref 0.6–1.1)
EGFR: 55 mL/min/{1.73_m2} — ABNORMAL LOW (ref >90)
Glucose: 121 mg/dl (ref 70–140)
POTASSIUM: 5 meq/L (ref 3.5–5.1)
Sodium: 140 mEq/L (ref 136–145)
TOTAL PROTEIN: 7.2 g/dL (ref 6.4–8.3)
Total Bilirubin: 0.51 mg/dL (ref 0.20–1.20)

## 2015-07-19 MED FILL — GABAPENTIN 100 MG CAPSULE: 100 | 30 days supply | Qty: 30 | Fill #1

## 2015-07-19 NOTE — Telephone Encounter (Signed)
spoke w/ pt and confirmed genetics appt for may... no appt available earlier

## 2015-07-19 NOTE — Progress Notes (Signed)
OFFICE PROGRESS NOTE   July 19, 2015   Physicians: Everitt Amber, Alveda Reasons, MD (PCP),  INTERVAL HISTORY:   Patient is seen, together with mother, in continuing attention to adjuvant chemotherapy in process for IIB carcinosarcoma of right ovary, having had cycle 5 carbo taxol on 07-12-15. She is neutropenic today despite on pro neulasta, but is not febrile. She is also thrombocytopenic and anemic related to the chemotherapy.   Patient has been more fatigued for past 2 days, no symptoms of infection. She had more nausea after most recent chemo than prior cycles, including a few episodes of vomiting, oral antiemetics somewhat helpful, nausea better now. She had no bowel movement x 4 days after chemo, could not use mag citrate due to nausea. She has had no bleeding or unusual bleeding. She denies SOB at rest or any chest pain.  No LE swelling. Peripheral neuropathy slight in feet and tips of fingers, not bothersome. Did sleep last PM. Bladder ok. No mucositis. Able to walk into office today. No problems with PAC. Remainder of 10 point Review of Systems negative/ unchanged.     CA 125 on 02-09-15 was 21.8 PAC by IR 04-11-15 Flu vaccine 12-12-14 Feraheme x1 on 06-14-15, not planning second dose but now off oral iron. Genetics referral made now  ONCOLOGIC HISTORY Patient had intermittent right lower quadrant abdominal pain since Aug 2016, with pelvic US 11-30-14 with uterine fibroids (11.2 x 4.8 x 6.8 cm. 1.7 x 1.7 x 2.0 cm subserosal fibroid in the right uterine body. 6.3 x 3.6 x 5.7 cm probable exophytic/pedunculated fibroid along the anterior uterine fundus), endometrium poorly visualized, right ovary poorly visualized but measured 4 x 2.8 x 4.4 cm and left ovary 4.9 x 3.4 x 4.9 cm with probable corpus luteal cyst called. Note patient is morbidly obese, with BMI >60. She had some fevers during that time. Patient was admitted to [facility] 02-18-15 thru 02-21-15 with pyelonephritis and acute renal  failure (creatinine 12.4), both of those problems resolving entirely with treatment. CT AP and MRI pelvis done to evaluate the pyelonephritis demonstrated cystic and solid pelvic mass appearing to be arising from the right adnexa 13 x 9 x 17.5 cm, with a second cystic and solid lesion within the left adnexa measuring 4.4 x 6.5 x 5.3 cm. A third cystic and solid mass was seen anterior to the uterus and superior to the bladder measuring 4.2 x 5.4 x 5.3 cm. She had no pelvic lymphadenopathy, no carcinomatosis, and CA 125 was normal at 21 U/mL. She was seen in consultation by Dr Denman George, with exploratory laparotomy 03-23-15 with TAH BSO, partial small bowel resection and radical debulking, with additional ventral hernia repair. She was hospitalized with the surgery from 12-6 thru 03-16-15, discharged on prophylactic lovenox which she continues, no post op complications in hospital. Jodell Cipro were removed on 03-27-15, some separation of wound with wound care continuing by patient at home. 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 She saw Dr Denman George 03-29-15, with recommendation for adjuvant chemotherapy and follow up with gyn oncology expected after 6 cycles of chemotherapy. Cycle 1 carbo taxol given 04-12-15, carbo capped at 750 mg and taxol dosed at 175 mg/m2 using actual weight. She was neutropenic with ANCX 0.1 on day 12 cycle 1. Doses same for cycle 2, granix begun day 6, ANC 0.2 on day  8. On pro neulasta used cycle 4, after dose delay and carbo decreased for thrombocytopenia.    Objective:  Vital signs in last 24 hours:  BP 104/66 mmHg  Pulse 119  Temp(Src) 98.2 F (36.8 C) (Oral)  Resp 120  Ht '5\' 5"'  (1.651 m)  Wt 334 lb 9.6 oz (151.774 kg)  BMI 55.68 kg/m2  SpO2 100%  LMP 10/16/2013 (Approximate) Weight down 11 lbs  from 07-09-15.  Alert, oriented and appropriate. Ambulatory without assistance, able to get on and off exam table. Looks tired but not in acute discomfort. Respirations not labored RA Alopecia  HEENT:PERRL, sclerae not icteric. Oral mucosa moist without lesions, posterior pharynx clear.  Neck supple. No JVD.  Lymphatics:no cervical,supraclavicular, axillary or inguinal adenopathy Resp: clear to auscultation bilaterally and normal percussion bilaterally Cardio: regular rate and rhythm. No gallop. GI: abdomen obese, soft, nontender, not distended, cannot appreciate mass or organomegaly. Normally active bowel sounds. Surgical incision not remarkable. Musculoskeletal/ Extremities: without pitting edema, cords, tenderness Neuro: slight peripheral neuropathy as noted. Otherwise nonfocal. PSYCH appropriate mood and affect Skin without rash, ecchymosis, petechiae Portacath-without erythema or tenderness  Lab Results:  Results for orders placed or performed in visit on 07/19/15  CBC with Differential  Result Value Ref Range   WBC 2.8 (L) 3.9 - 10.3 10e3/uL   NEUT# 0.7 (L) 1.5 - 6.5 10e3/uL   HGB 9.5 (L) 11.6 - 15.9 g/dL   HCT 28.8 (L) 34.8 - 46.6 %   Platelets 68 (L) 145 - 400 10e3/uL   MCV 93.3 79.5 - 101.0 fL   MCH 30.7 25.1 - 34.0 pg   MCHC 32.9 31.5 - 36.0 g/dL   RBC 3.09 (L) 3.70 - 5.45 10e6/uL   RDW 22.8 (H) 11.2 - 14.5 %   lymph# 1.6 0.9 - 3.3 10e3/uL   MONO# 0.4 0.1 - 0.9 10e3/uL   Eosinophils Absolute 0.1 0.0 - 0.5 10e3/uL   Basophils Absolute 0.0 0.0 - 0.1 10e3/uL   NEUT% 26.0 (L) 38.4 - 76.8 %   LYMPH% 56.4 (H) 14.0 - 49.7 %   MONO% 14.3 (H) 0.0 - 14.0 %   EOS% 2.3 0.0 - 7.0 %   BASO% 1.0 0.0 - 2.0 %  Comprehensive metabolic panel  Result Value Ref Range   Sodium 140 136 - 145 mEq/L   Potassium 5.0 3.5 - 5.1 mEq/L   Chloride 106 98 - 109 mEq/L   CO2 24 22 - 29 mEq/L   Glucose 121 70 - 140 mg/dl   BUN 22.4 7.0 - 26.0 mg/dL   Creatinine 1.3 (H) 0.6 - 1.1 mg/dL   Total  Bilirubin 0.51 0.20 - 1.20 mg/dL   Alkaline Phosphatase 87 40 - 150 U/L   AST 13 5 - 34 U/L   ALT <9 0 - 55 U/L   Total Protein 7.2 6.4 - 8.3 g/dL   Albumin 3.7 3.5 - 5.0 g/dL   Calcium 10.2 8.4 - 10.4 mg/dL   Anion Gap 10 3 - 11 mEq/L   EGFR 55 (L) >90 ml/min/1.73 m2     Studies/Results:  No results found.  Medications: I have reviewed the patient's current medications. Per managed care after visit, OK for EMEND with cycle 6 chemo   DISCUSSION Interval history reviewed, blood counts reviewed and she is aware of neutropenic and bleeding precautions. By history, on pro neulasta injector worked as previously, tho I have cautioned her to call promptly if device seems to have any problems for next cycle. NOTE due  to counts she has not been able to tolerate carboplatin based on actual weight.  We have discussed genetics counseling and testing, as this information could support use of other agents in her if needed in future, might support additional surveillance/ imaging in her, and could be useful to other family members. She is in agreement with referral to genetics, requested coordinating visits and lab draws if possible.   Discussed restaging scans and follow up with gyn oncology after completing 6 cycles of this adjuvant chemotherapy.    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 Pathology reviewed at [facility]. Taxol carboplatin begun 04-12-15, cycle 6 due 4-27 as long as ANC >=1.5 and platelets >=100k. Course has been complicated by severe taxol aches and cytopenias despite gCSF, which have limited dosing. Genetics referral. Follow up scans and with gyn oncology after cycle 6. Should refer to GOG 0225 if NED at completion of therapy.  2.synchronous IA grade 1 endometrioid adenocarcinoma of uterus, treated surgically 3. Chemo  neutropenia despite neulasta by on pro, as above. Chemo thrombocytopenia despite dose delays and adjustment in carboplatin, no bleeding. Multifactorial anemia including chemo anemia, see below. Follow counts.  4.morbid obesity, BMI 59. This obviously impacts tolerance for the anemia and increases risk for the gyn malignancy 5.chemo peripheral neuropathy: lots of movement and exercise, gabapentin at hs and can increase dose if needed. Not interfering with function and, with potential benefit from taxol at this point, we are in agreement with continuing chemo and following  6.benign positional vertigo resolved 7.acute renal failure with creatinine 12.4 at presentation with pyelonephritis, resolved. Creatinine better with better hydration. Dosing carbo as noted 8.overdue mammograms, which we will address when possible. 9.perforated diverticular disease with temporary colostomy 2008.  10.flu vaccine 12-12-14 11. no DVT by LE dopplers after 03-29-15. Completed post op prophylactic lovenox LE swelling improved.. 12. iron deficiency anemia:  One dose feraheme 06-14-15, oral iron DCd. f/u colonoscopy information 13.history of PCN allergy, which was not an anaphylactic reaction.  14.financial concerns: appreciate assistance from [facility]  15.increased GERD: better on protonix 16.PAC flipped 05-10-15, manipulated back into correct position, follow up dye study fine. Peripheral IV access very difficult. 17. Increased chemo nausea and vomiting after cycle 5: add EMEND to cycle 6, ok per managed care  Time spent 25 min including >50% counseling and coordination of care. She knows to call if problems including fever, symptoms infection, persistent marked fatigue, bleeding. I will see her with labs 4-24 prior to cycle 6 chemo due 08-02-15.   Itamar Mcgowan P, MD   07/19/2015, 9:15 AM

## 2015-07-20 ENCOUNTER — Other Ambulatory Visit: Payer: Self-pay | Admitting: Oncology

## 2015-07-20 DIAGNOSIS — N183 Chronic kidney disease, stage 3 unspecified: Secondary | ICD-10-CM

## 2015-07-20 DIAGNOSIS — C561 Malignant neoplasm of right ovary: Secondary | ICD-10-CM

## 2015-07-20 DIAGNOSIS — N179 Acute kidney failure, unspecified: Secondary | ICD-10-CM

## 2015-07-29 ENCOUNTER — Other Ambulatory Visit: Payer: Self-pay | Admitting: Oncology

## 2015-07-30 ENCOUNTER — Ambulatory Visit (HOSPITAL_BASED_OUTPATIENT_CLINIC_OR_DEPARTMENT_OTHER): Payer: BLUE CROSS/BLUE SHIELD | Admitting: Oncology

## 2015-07-30 ENCOUNTER — Encounter: Payer: Self-pay | Admitting: Oncology

## 2015-07-30 ENCOUNTER — Other Ambulatory Visit: Payer: Self-pay | Admitting: Oncology

## 2015-07-30 ENCOUNTER — Other Ambulatory Visit: Payer: Self-pay

## 2015-07-30 ENCOUNTER — Other Ambulatory Visit (HOSPITAL_BASED_OUTPATIENT_CLINIC_OR_DEPARTMENT_OTHER): Payer: BLUE CROSS/BLUE SHIELD

## 2015-07-30 ENCOUNTER — Telehealth: Payer: Self-pay | Admitting: Oncology

## 2015-07-30 VITALS — BP 133/67 | HR 101 | Temp 97.6°F | Resp 20 | Wt 343.1 lb

## 2015-07-30 DIAGNOSIS — T451X5A Adverse effect of antineoplastic and immunosuppressive drugs, initial encounter: Principal | ICD-10-CM

## 2015-07-30 DIAGNOSIS — C541 Malignant neoplasm of endometrium: Secondary | ICD-10-CM

## 2015-07-30 DIAGNOSIS — Z95828 Presence of other vascular implants and grafts: Secondary | ICD-10-CM

## 2015-07-30 DIAGNOSIS — R110 Nausea: Secondary | ICD-10-CM

## 2015-07-30 DIAGNOSIS — D6959 Other secondary thrombocytopenia: Secondary | ICD-10-CM

## 2015-07-30 DIAGNOSIS — G620 Drug-induced polyneuropathy: Secondary | ICD-10-CM

## 2015-07-30 DIAGNOSIS — D701 Agranulocytosis secondary to cancer chemotherapy: Secondary | ICD-10-CM

## 2015-07-30 DIAGNOSIS — R112 Nausea with vomiting, unspecified: Secondary | ICD-10-CM

## 2015-07-30 DIAGNOSIS — K219 Gastro-esophageal reflux disease without esophagitis: Secondary | ICD-10-CM

## 2015-07-30 DIAGNOSIS — C561 Malignant neoplasm of right ovary: Secondary | ICD-10-CM

## 2015-07-30 DIAGNOSIS — C562 Malignant neoplasm of left ovary: Secondary | ICD-10-CM | POA: Diagnosis not present

## 2015-07-30 DIAGNOSIS — D6481 Anemia due to antineoplastic chemotherapy: Secondary | ICD-10-CM

## 2015-07-30 DIAGNOSIS — D509 Iron deficiency anemia, unspecified: Secondary | ICD-10-CM

## 2015-07-30 DIAGNOSIS — Z6841 Body Mass Index (BMI) 40.0 and over, adult: Secondary | ICD-10-CM

## 2015-07-30 DIAGNOSIS — C569 Malignant neoplasm of unspecified ovary: Secondary | ICD-10-CM

## 2015-07-30 LAB — CBC WITH DIFFERENTIAL/PLATELET
BASO%: 0.3 % (ref 0.0–2.0)
BASOS ABS: 0 10*3/uL (ref 0.0–0.1)
EOS%: 0.6 % (ref 0.0–7.0)
Eosinophils Absolute: 0 10*3/uL (ref 0.0–0.5)
HEMATOCRIT: 27.6 % — AB (ref 34.8–46.6)
HEMOGLOBIN: 9 g/dL — AB (ref 11.6–15.9)
LYMPH#: 1.7 10*3/uL (ref 0.9–3.3)
LYMPH%: 24.4 % (ref 14.0–49.7)
MCH: 31.5 pg (ref 25.1–34.0)
MCHC: 32.6 g/dL (ref 31.5–36.0)
MCV: 96.5 fL (ref 79.5–101.0)
MONO#: 0.4 10*3/uL (ref 0.1–0.9)
MONO%: 5.9 % (ref 0.0–14.0)
NEUT#: 4.8 10*3/uL (ref 1.5–6.5)
NEUT%: 68.8 % (ref 38.4–76.8)
PLATELETS: 98 10*3/uL — AB (ref 145–400)
RBC: 2.86 10*6/uL — ABNORMAL LOW (ref 3.70–5.45)
RDW: 21.1 % — ABNORMAL HIGH (ref 11.2–14.5)
WBC: 6.9 10*3/uL (ref 3.9–10.3)

## 2015-07-30 LAB — COMPREHENSIVE METABOLIC PANEL
ALBUMIN: 3.3 g/dL — AB (ref 3.5–5.0)
ALK PHOS: 79 U/L (ref 40–150)
ANION GAP: 9 meq/L (ref 3–11)
AST: 11 U/L (ref 5–34)
BUN: 22.2 mg/dL (ref 7.0–26.0)
CALCIUM: 10.1 mg/dL (ref 8.4–10.4)
CHLORIDE: 108 meq/L (ref 98–109)
CO2: 24 mEq/L (ref 22–29)
CREATININE: 1.1 mg/dL (ref 0.6–1.1)
EGFR: 65 mL/min/{1.73_m2} — ABNORMAL LOW (ref >90)
Glucose: 102 mg/dl (ref 70–140)
Potassium: 4.7 mEq/L (ref 3.5–5.1)
Sodium: 141 mEq/L (ref 136–145)
Total Bilirubin: 0.42 mg/dL (ref 0.20–1.20)
Total Protein: 6.9 g/dL (ref 6.4–8.3)

## 2015-07-30 MED ORDER — LORAZEPAM 1 MG PO TABS: ORAL_TABLET | ORAL | Status: DC

## 2015-07-30 MED ORDER — OXYCODONE HCL 5 MG PO TABS: 5.0000 mg | ORAL_TABLET | ORAL | Status: DC | PRN

## 2015-07-30 MED FILL — LORazepam 1 MG TABS: 1 | 5 days supply | Qty: 20 | Fill #0

## 2015-07-30 MED FILL — PANTOPRAZOLE SOD DR 40 MG T: 40 | 30 days supply | Qty: 30 | Fill #2

## 2015-07-30 MED FILL — oxyCODONE HCL 5 MG TABS: 5 | 2 days supply | Qty: 30 | Fill #0

## 2015-07-30 NOTE — Progress Notes (Signed)
OFFICE PROGRESS NOTE   July 30, 2015   Physicians: Everitt Amber, Alveda Reasons, MD (PCP),  INTERVAL HISTORY:  Patient is seen, together with mother, in continuing attention to adjuvant chemotherapy in process for IIB carcinosarcoma of right ovary, with cycle 6 carboplatin taxol planned 08-02-15. Despite on pro neulasta she was neutropenic day 8 cycle 5, but not febrile and ANC has recovered now.  Patient has felt better in past week, with energy some better, no nausea and bowels moving regularly again. She has slight tingling in feet, not interfering with ambulation, better with movement. She is using gabapentin 200 mg at hs, which is helpful. She has no significant peripheral neuropathy in hands.  She denies increased SOB, abdominal or pelvic pain, LE swelling, any bleeding. No problems with PAC. She had no fever while neutropenic and no symptoms of infection.  Remainder of 10 point Review of Systems negative   CA 125 on 02-09-15 was 21.8 PAC by IR 04-11-15 Flu vaccine 12-12-14 Feraheme x1 on 06-14-15, not planning second dose but now off oral iron. Genetics counseling scheduled for 08-28-15  ONCOLOGIC HISTORY Patient had intermittent right lower quadrant abdominal pain since Aug 2016, with pelvic US 11-30-14 with uterine fibroids (11.2 x 4.8 x 6.8 cm. 1.7 x 1.7 x 2.0 cm subserosal fibroid in the right uterine body. 6.3 x 3.6 x 5.7 cm probable exophytic/pedunculated fibroid along the anterior uterine fundus), endometrium poorly visualized, right ovary poorly visualized but measured 4 x 2.8 x 4.4 cm and left ovary 4.9 x 3.4 x 4.9 cm with probable corpus luteal cyst called. Note patient is morbidly obese, with BMI >60. She had some fevers during that time. Patient was admitted to [facility] 02-18-15 thru 02-21-15 with pyelonephritis and acute renal failure (creatinine 12.4), both of those problems resolving entirely with treatment. CT AP and MRI pelvis done to evaluate the pyelonephritis demonstrated  cystic and solid pelvic mass appearing to be arising from the right adnexa 13 x 9 x 17.5 cm, with a second cystic and solid lesion within the left adnexa measuring 4.4 x 6.5 x 5.3 cm. A third cystic and solid mass was seen anterior to the uterus and superior to the bladder measuring 4.2 x 5.4 x 5.3 cm. She had no pelvic lymphadenopathy, no carcinomatosis, and CA 125 was normal at 21 U/mL. She was seen in consultation by Dr Denman George, with exploratory laparotomy 03-23-15 with TAH BSO, partial small bowel resection and radical debulking, with additional ventral hernia repair. She was hospitalized with the surgery from 12-6 thru 03-16-15, discharged on prophylactic lovenox which she continues, no post op complications in hospital. Jodell Cipro were removed on 03-27-15, some separation of wound with wound care continuing by patient at home. 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 She saw Dr Denman George 03-29-15, with recommendation for adjuvant chemotherapy and follow up with gyn oncology expected after 6 cycles of chemotherapy. Cycle 1 carbo taxol given 04-12-15, carbo capped at 750 mg and taxol dosed at 175 mg/m2 using actual weight. She was neutropenic with ANCX 0.1 on day 12 cycle 1. Doses same for cycle 2, granix begun day 6, ANC 0.2 on day 8. On pro neulasta used cycle 4, after dose delay and carbo decreased for thrombocytopenia.   Objective:  Vital signs in last 24 hours:  BP 133/67 mmHg  Pulse 101  Temp(Src)  97.6 F (36.4 C) (Oral)  Resp 20  Wt 343 lb 1.6 oz (155.629 kg)  SpO2 100%  LMP 10/16/2013 (Approximate) Weight up 9 lbs Alert, oriented and appropriate. Respirations not labored. Ambulatory without assistance, able to get on and off exam table.  Complete alopecia  HEENT:PERRL, sclerae not icteric. Oral mucosa moist  without lesions, posterior pharynx clear.  Lymphatics:no supraclavicular adenopathy Resp: clear to auscultation bilaterally and normal percussion bilaterally Cardio: regular rate and rhythm. No gallop. GI: abdomen obese, soft, nontender, not distended. Normally active bowel sounds. Surgical incision not remarkable. Musculoskeletal/ Extremities: without pitting edema, cords, tenderness Neuro: no significant peripheral neuropathy. Otherwise nonfocal. PSYCH appropriate mood and affect Skin without rash, ecchymosis, petechiae Portacath-without erythema or tenderness  Lab Results:  Results for orders placed or performed in visit on 07/30/15  CBC with Differential  Result Value Ref Range   WBC 6.9 3.9 - 10.3 10e3/uL   NEUT# 4.8 1.5 - 6.5 10e3/uL   HGB 9.0 (L) 11.6 - 15.9 g/dL   HCT 27.6 (L) 34.8 - 46.6 %   Platelets 98 (L) 145 - 400 10e3/uL   MCV 96.5 79.5 - 101.0 fL   MCH 31.5 25.1 - 34.0 pg   MCHC 32.6 31.5 - 36.0 g/dL   RBC 2.86 (L) 3.70 - 5.45 10e6/uL   RDW 21.1 (H) 11.2 - 14.5 %   lymph# 1.7 0.9 - 3.3 10e3/uL   MONO# 0.4 0.1 - 0.9 10e3/uL   Eosinophils Absolute 0.0 0.0 - 0.5 10e3/uL   Basophils Absolute 0.0 0.0 - 0.1 10e3/uL   NEUT% 68.8 38.4 - 76.8 %   LYMPH% 24.4 14.0 - 49.7 %   MONO% 5.9 0.0 - 14.0 %   EOS% 0.6 0.0 - 7.0 %   BASO% 0.3 0.0 - 2.0 %  Comprehensive metabolic panel  Result Value Ref Range   Sodium 141 136 - 145 mEq/L   Potassium 4.7 3.5 - 5.1 mEq/L   Chloride 108 98 - 109 mEq/L   CO2 24 22 - 29 mEq/L   Glucose 102 70 - 140 mg/dl   BUN 22.2 7.0 - 26.0 mg/dL   Creatinine 1.1 0.6 - 1.1 mg/dL   Total Bilirubin 0.42 0.20 - 1.20 mg/dL   Alkaline Phosphatase 79 40 - 150 U/L   AST 11 5 - 34 U/L   ALT <9 0 - 55 U/L   Total Protein 6.9 6.4 - 8.3 g/dL   Albumin 3.3 (L) 3.5 - 5.0 g/dL   Calcium 10.1 8.4 - 10.4 mg/dL   Anion Gap 9 3 - 11 mEq/L   EGFR 65 (L) >90 ml/min/1.73 m2     Studies/Results:  No results found.  Medications: I have reviewed the patient's  current medications. Uses some oxyIR for taxol/ neulasta aches. Taxol decreased to 135 mg/m2 for cycle 6, due to neuropathy in feet Added EMEND for cycle 6 due to increased nausea with cycle 5  DISCUSSION Medications as above, addition of EMEND (ok per managed care) Taxol peripheral neuropathy discussed, plan as above, continue gabapentin at hs prn Needs CTs in May prior to return to Dr Denman George. 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 Pathology reviewed at [facility]. Taxol carboplatin begun 04-12-15, cycle 6 due 4-27 as long as ANC >=1.5 and platelets >=100k. Course has been  complicated by severe taxol aches and cytopenias despite gCSF, which have limited dosing. Genetics referral. Follow up scans and with gyn oncology after cycle 6. Should refer to [facility]G 0225 if NED at completion of therapy.  2.synchronous IA grade 1 endometrioid adenocarcinoma of uterus, treated surgically 3. Chemo neutropenia despite neulasta by on pro, as above. Chemo thrombocytopenia despite dose delays and adjustment in carboplatin, no bleeding. Multifactorial anemia including chemo anemia, Follow counts.  4.morbid obesity, BMI 59. This obviously impacts tolerance for the anemia and increases risk for the gyn malignancy. Patient motivated to address this. 5.chemo peripheral neuropathy: lots of movement and exercise, gabapentin at hs and can increase dose if needed. As above 6.benign positional vertigo resolved 7.acute renal failure with creatinine 12.4 at presentation with pyelonephritis, resolved. Creatinine better with better hydration. Dosing carbo as noted 8.overdue mammograms, which we will address when possible. 9.perforated diverticular disease with temporary colostomy 2008.  10.flu vaccine 12-12-14 11. Increased chemo  nausea and vomiting after cycle 5: add EMEND to cycle 6, ok per managed care 12. iron deficiency anemia: One dose feraheme 06-14-15, oral iron DCd. f/u colonoscopy information 13.history of PCN allergy, which was not an anaphylactic reaction.  14.financial concerns: appreciate assistance from [facility]  15.increased GERD: better on protonix 16.PAC flipped 05-10-15, manipulated back into correct position, follow up dye study fine. Peripheral IV access very difficult.  All questions answered. Chemo and neulasta orders confirmed. Time spent 25 min including >50% counseling and coordination of care.      Gordy Levan, MD   07/30/2015, 12:31 PM

## 2015-07-30 NOTE — Telephone Encounter (Signed)
appt made and avs printed °

## 2015-08-02 ENCOUNTER — Other Ambulatory Visit (HOSPITAL_BASED_OUTPATIENT_CLINIC_OR_DEPARTMENT_OTHER): Payer: BLUE CROSS/BLUE SHIELD

## 2015-08-02 ENCOUNTER — Ambulatory Visit (HOSPITAL_BASED_OUTPATIENT_CLINIC_OR_DEPARTMENT_OTHER): Payer: BLUE CROSS/BLUE SHIELD

## 2015-08-02 ENCOUNTER — Ambulatory Visit: Payer: BLUE CROSS/BLUE SHIELD

## 2015-08-02 ENCOUNTER — Ambulatory Visit: Payer: BLUE CROSS/BLUE SHIELD | Admitting: Nutrition

## 2015-08-02 ENCOUNTER — Other Ambulatory Visit: Payer: Self-pay | Admitting: Oncology

## 2015-08-02 ENCOUNTER — Telehealth: Payer: Self-pay | Admitting: Oncology

## 2015-08-02 VITALS — BP 143/72 | HR 92 | Temp 98.3°F | Resp 18

## 2015-08-02 DIAGNOSIS — Z95828 Presence of other vascular implants and grafts: Secondary | ICD-10-CM

## 2015-08-02 DIAGNOSIS — C561 Malignant neoplasm of right ovary: Secondary | ICD-10-CM

## 2015-08-02 DIAGNOSIS — Z5111 Encounter for antineoplastic chemotherapy: Secondary | ICD-10-CM

## 2015-08-02 DIAGNOSIS — C569 Malignant neoplasm of unspecified ovary: Secondary | ICD-10-CM

## 2015-08-02 LAB — CBC WITH DIFFERENTIAL/PLATELET
BASO%: 0.1 % (ref 0.0–2.0)
Basophils Absolute: 0 10*3/uL (ref 0.0–0.1)
EOS%: 0 % (ref 0.0–7.0)
Eosinophils Absolute: 0 10*3/uL (ref 0.0–0.5)
HCT: 27.6 % — ABNORMAL LOW (ref 34.8–46.6)
HGB: 9.3 g/dL — ABNORMAL LOW (ref 11.6–15.9)
LYMPH%: 9.2 % — AB (ref 14.0–49.7)
MCH: 32.6 pg (ref 25.1–34.0)
MCHC: 33.8 g/dL (ref 31.5–36.0)
MCV: 96.3 fL (ref 79.5–101.0)
MONO#: 0 10*3/uL — AB (ref 0.1–0.9)
MONO%: 0.6 % (ref 0.0–14.0)
NEUT%: 90.1 % — AB (ref 38.4–76.8)
NEUTROS ABS: 6.1 10*3/uL (ref 1.5–6.5)
Platelets: 142 10*3/uL — ABNORMAL LOW (ref 145–400)
RBC: 2.86 10*6/uL — AB (ref 3.70–5.45)
RDW: 22.9 % — ABNORMAL HIGH (ref 11.2–14.5)
WBC: 6.7 10*3/uL (ref 3.9–10.3)
lymph#: 0.6 10*3/uL — ABNORMAL LOW (ref 0.9–3.3)

## 2015-08-02 LAB — COMPREHENSIVE METABOLIC PANEL
ALT: 9 U/L (ref 0–55)
AST: 12 U/L (ref 5–34)
Albumin: 3.5 g/dL (ref 3.5–5.0)
Alkaline Phosphatase: 86 U/L (ref 40–150)
Anion Gap: 12 mEq/L — ABNORMAL HIGH (ref 3–11)
BILIRUBIN TOTAL: 0.33 mg/dL (ref 0.20–1.20)
BUN: 31.1 mg/dL — ABNORMAL HIGH (ref 7.0–26.0)
CHLORIDE: 107 meq/L (ref 98–109)
CO2: 20 meq/L — AB (ref 22–29)
CREATININE: 1.4 mg/dL — AB (ref 0.6–1.1)
Calcium: 10 mg/dL (ref 8.4–10.4)
EGFR: 51 mL/min/{1.73_m2} — ABNORMAL LOW (ref >90)
GLUCOSE: 160 mg/dL — AB (ref 70–140)
Potassium: 5.3 mEq/L — ABNORMAL HIGH (ref 3.5–5.1)
SODIUM: 138 meq/L (ref 136–145)
TOTAL PROTEIN: 7.3 g/dL (ref 6.4–8.3)

## 2015-08-02 MED ORDER — PEGFILGRASTIM 6 MG/0.6ML ~~LOC~~ PSKT
6.0000 mg | PREFILLED_SYRINGE | Freq: Once | SUBCUTANEOUS | Status: AC
  Administered 2015-08-02: 6 mg via SUBCUTANEOUS
  Filled 2015-08-02: qty 0.6

## 2015-08-02 MED ORDER — FAMOTIDINE IN NACL 20-0.9 MG/50ML-% IV SOLN
20.0000 mg | Freq: Once | INTRAVENOUS | Status: AC
  Administered 2015-08-02: 20 mg via INTRAVENOUS

## 2015-08-02 MED ORDER — DIPHENHYDRAMINE HCL 50 MG/ML IJ SOLN
INTRAMUSCULAR | Status: AC
  Filled 2015-08-02: qty 1

## 2015-08-02 MED ORDER — SODIUM CHLORIDE 0.9 % IJ SOLN
10.0000 mL | INTRAMUSCULAR | Status: DC | PRN
  Filled 2015-08-02: qty 10

## 2015-08-02 MED ORDER — SODIUM CHLORIDE 0.9% FLUSH
10.0000 mL | INTRAVENOUS | Status: DC | PRN
Start: 2015-08-02 — End: 2020-07-21
  Administered 2015-08-02 (×2): 10 mL via INTRAVENOUS
  Filled 2015-08-02: qty 10

## 2015-08-02 MED ORDER — SODIUM CHLORIDE 0.9 % IV SOLN
500.0000 mL | Freq: Once | INTRAVENOUS | Status: AC
  Administered 2015-08-02: 500 mL via INTRAVENOUS

## 2015-08-02 MED ORDER — DIPHENHYDRAMINE HCL 50 MG/ML IJ SOLN
50.0000 mg | Freq: Once | INTRAMUSCULAR | Status: AC
Start: 2015-08-02 — End: 2015-08-02
  Administered 2015-08-02: 50 mg via INTRAVENOUS

## 2015-08-02 MED ORDER — SODIUM CHLORIDE 0.9 % IV SOLN
600.0000 mg | Freq: Once | INTRAVENOUS | Status: AC
  Administered 2015-08-02: 600 mg via INTRAVENOUS
  Filled 2015-08-02: qty 60

## 2015-08-02 MED ORDER — FAMOTIDINE IN NACL 20-0.9 MG/50ML-% IV SOLN
INTRAVENOUS | Status: AC
  Filled 2015-08-02: qty 50

## 2015-08-02 MED ORDER — SODIUM CHLORIDE 0.9 % IV SOLN
141.0000 mg/m2 | Freq: Once | INTRAVENOUS | Status: AC
  Administered 2015-08-02: 378 mg via INTRAVENOUS
  Filled 2015-08-02: qty 63

## 2015-08-02 MED ORDER — SODIUM CHLORIDE 0.9 % IV SOLN
Freq: Once | INTRAVENOUS | Status: AC
  Administered 2015-08-02: 10:00:00 via INTRAVENOUS
  Filled 2015-08-02: qty 5

## 2015-08-02 MED ORDER — SODIUM CHLORIDE 0.9 % IV SOLN
Freq: Once | INTRAVENOUS | Status: AC
  Administered 2015-08-02: 11:00:00 via INTRAVENOUS
  Filled 2015-08-02: qty 8

## 2015-08-02 MED ORDER — SODIUM CHLORIDE 0.9 % IV SOLN
Freq: Once | INTRAVENOUS | Status: AC
  Administered 2015-08-02: 09:00:00 via INTRAVENOUS

## 2015-08-02 MED ORDER — PACLITAXEL CHEMO INJECTION 300 MG/50ML: 135.0000 mg/m2 | Freq: Once | INTRAVENOUS | Status: DC

## 2015-08-02 MED ORDER — HEPARIN SOD (PORK) LOCK FLUSH 100 UNIT/ML IV SOLN
500.0000 [IU] | Freq: Once | INTRAVENOUS | Status: AC | PRN
  Administered 2015-08-02: 500 [IU]
  Filled 2015-08-02: qty 5

## 2015-08-02 NOTE — Patient Instructions (Signed)

## 2015-08-02 NOTE — Progress Notes (Signed)
Okay to treat with lab results today. Give 250 ml Normal saline iv before Taxal and after infusion.

## 2015-08-02 NOTE — Patient Instructions (Addendum)
[place] Discharge Instructions for Patients Receiving Chemotherapy  Today you received the following chemotherapy agents: Taxol and Carboplatin   To help prevent nausea and vomiting after your treatment, we encourage you to take your nausea medication as directed.    If you develop nausea and vomiting that is not controlled by your nausea medication, call the clinic.   BELOW ARE SYMPTOMS THAT SHOULD BE REPORTED IMMEDIATELY:  *FEVER GREATER THAN 100.5 F  *CHILLS WITH OR WITHOUT FEVER  NAUSEA AND VOMITING THAT IS NOT CONTROLLED WITH YOUR NAUSEA MEDICATION  *UNUSUAL SHORTNESS OF BREATH  *UNUSUAL BRUISING OR BLEEDING  TENDERNESS IN MOUTH AND THROAT WITH OR WITHOUT PRESENCE OF ULCERS  *URINARY PROBLEMS  *BOWEL PROBLEMS  UNUSUAL RASH Items with * indicate a potential emergency and should be followed up as soon as possible.  Feel free to call the clinic you have any questions or concerns. The clinic phone number is [phone].  Please show the [facility] at check-in to the Emergency Department and triage nurse.    Pegfilgrastim injection What is this medicine? PEGFILGRASTIM (PEG fil gra stim) is a long-acting granulocyte colony-stimulating factor that stimulates the growth of neutrophils, a type of white blood cell important in the body's fight against infection. It is used to reduce the incidence of fever and infection in patients with certain types of cancer who are receiving chemotherapy that affects the bone marrow, and to increase survival after being exposed to high doses of radiation. This medicine may be used for other purposes; ask your health care provider or pharmacist if you have questions. What should I tell my health care provider before I take this medicine? They need to know if you have any of these conditions: -kidney disease -latex allergy -ongoing radiation therapy -sickle cell disease -skin reactions to acrylic adhesives (On-Body  Injector only) -an unusual or allergic reaction to pegfilgrastim, filgrastim, other medicines, foods, dyes, or preservatives -pregnant or trying to get pregnant -breast-feeding How should I use this medicine? This medicine is for injection under the skin. If you get this medicine at home, you will be taught how to prepare and give the pre-filled syringe or how to use the On-body Injector. Refer to the patient Instructions for Use for detailed instructions. Use exactly as directed. Take your medicine at regular intervals. Do not take your medicine more often than directed. It is important that you put your used needles and syringes in a special sharps container. Do not put them in a trash can. If you do not have a sharps container, call your pharmacist or healthcare provider to get one. Talk to your pediatrician regarding the use of this medicine in children. While this drug may be prescribed for selected conditions, precautions do apply. Overdosage: If you think you have taken too much of this medicine contact a poison control center or emergency room at once. NOTE: This medicine is only for you. Do not share this medicine with others. What if I miss a dose? It is important not to miss your dose. Call your doctor or health care professional if you miss your dose. If you miss a dose due to an On-body Injector failure or leakage, a new dose should be administered as soon as possible using a single prefilled syringe for manual use. What may interact with this medicine? Interactions have not been studied. Give your health care provider a list of all the medicines, herbs, non-prescription drugs, or dietary supplements you use. Also tell them  if you smoke, drink alcohol, or use illegal drugs. Some items may interact with your medicine. This list may not describe all possible interactions. Give your health care provider a list of all the medicines, herbs, non-prescription drugs, or dietary supplements you  use. Also tell them if you smoke, drink alcohol, or use illegal drugs. Some items may interact with your medicine. What should I watch for while using this medicine? You may need blood work done while you are taking this medicine. If you are going to need a MRI, CT scan, or other procedure, tell your doctor that you are using this medicine (On-Body Injector only). What side effects may I notice from receiving this medicine? Side effects that you should report to your doctor or health care professional as soon as possible: -allergic reactions like skin rash, itching or hives, swelling of the face, lips, or tongue -dizziness -fever -pain, redness, or irritation at site where injected -pinpoint red spots on the skin -red or dark-brown urine -shortness of breath or breathing problems -stomach or side pain, or pain at the shoulder -swelling -tiredness -trouble passing urine or change in the amount of urine Side effects that usually do not require medical attention (report to your doctor or health care professional if they continue or are bothersome): -bone pain -muscle pain This list may not describe all possible side effects. Call your doctor for medical advice about side effects. You may report side effects to FDA at 1-800-FDA-1088. Where should I keep my medicine? Keep out of the reach of children. Store pre-filled syringes in a refrigerator between 2 and 8 degrees C (36 and 46 degrees F). Do not freeze. Keep in carton to protect from light. Throw away this medicine if it is left out of the refrigerator for more than 48 hours. Throw away any unused medicine after the expiration date. NOTE: This sheet is a summary. It may not cover all possible information. If you have questions about this medicine, talk to your doctor, pharmacist, or health care provider.    2016, [employer] Standard. (2014-04-13 14:30:14)

## 2015-08-02 NOTE — Progress Notes (Signed)
Nutrition follow-up completed with patient receiving treatment for ovarian cancer. Weight documented as 343.1 pounds decreased from 354.9 pounds February 9. Patient reports her potassium level is high and is requesting information on high potassium foods.  Nutrition diagnosis: Unintended weight loss continues. Food and nutrition related knowledge deficit related to increased potassium levels as evidenced by no prior need for nutrition related information.  Intervention: Patient educated on avoiding high potassium foods and consuming moderate amounts of medium to low potassium foods. Reviewed fact sheet with patient and provided a copy. Encouraged patient to increase protein foods to minimize lean muscle mass loss. Questions were answered.  Teach back method used.  Provided patient with my contact information.  Monitoring, evaluation, goals: Patient will tolerate a low potassium diet with adequate protein to minimize loss of lean body mass.  Next visit: Patient will contact me by telephone if needed.  **Disclaimer: This note was dictated with voice recognition software. Similar sounding words can inadvertently be transcribed and this note may contain transcription errors which may not have been corrected upon publication of note.**

## 2015-08-02 NOTE — Telephone Encounter (Signed)
lvm for pt reagarding to sat appt

## 2015-08-04 ENCOUNTER — Ambulatory Visit (HOSPITAL_BASED_OUTPATIENT_CLINIC_OR_DEPARTMENT_OTHER): Payer: BLUE CROSS/BLUE SHIELD

## 2015-08-04 VITALS — BP 110/54 | HR 76 | Temp 97.7°F | Resp 18

## 2015-08-04 DIAGNOSIS — C569 Malignant neoplasm of unspecified ovary: Secondary | ICD-10-CM

## 2015-08-04 DIAGNOSIS — C561 Malignant neoplasm of right ovary: Secondary | ICD-10-CM | POA: Diagnosis not present

## 2015-08-04 MED ORDER — SODIUM CHLORIDE 0.9 % IV SOLN
INTRAVENOUS | Status: DC
  Administered 2015-08-04: 08:00:00 via INTRAVENOUS

## 2015-08-04 MED ORDER — SODIUM CHLORIDE 0.9 % IV SOLN: Freq: Once | INTRAVENOUS | Status: DC | PRN

## 2015-08-04 MED ORDER — SODIUM CHLORIDE 0.9% FLUSH
10.0000 mL | INTRAVENOUS | Status: DC | PRN
  Administered 2015-08-04: 10 mL via INTRAVENOUS
  Filled 2015-08-04: qty 10

## 2015-08-04 MED ORDER — HEPARIN SOD (PORK) LOCK FLUSH 100 UNIT/ML IV SOLN
500.0000 [IU] | Freq: Once | INTRAVENOUS | Status: AC
  Administered 2015-08-04: 500 [IU] via INTRAVENOUS
  Filled 2015-08-04: qty 5

## 2015-08-04 NOTE — Patient Instructions (Signed)

## 2015-08-15 ENCOUNTER — Other Ambulatory Visit: Payer: Self-pay

## 2015-08-15 ENCOUNTER — Other Ambulatory Visit: Payer: Self-pay | Admitting: Oncology

## 2015-08-16 ENCOUNTER — Telehealth: Payer: Self-pay | Admitting: Oncology

## 2015-08-16 ENCOUNTER — Other Ambulatory Visit (HOSPITAL_BASED_OUTPATIENT_CLINIC_OR_DEPARTMENT_OTHER): Payer: BLUE CROSS/BLUE SHIELD

## 2015-08-16 ENCOUNTER — Ambulatory Visit: Payer: BLUE CROSS/BLUE SHIELD

## 2015-08-16 ENCOUNTER — Ambulatory Visit (HOSPITAL_BASED_OUTPATIENT_CLINIC_OR_DEPARTMENT_OTHER): Payer: BLUE CROSS/BLUE SHIELD | Admitting: Oncology

## 2015-08-16 ENCOUNTER — Encounter: Payer: Self-pay | Admitting: Oncology

## 2015-08-16 VITALS — BP 131/67 | HR 103 | Temp 98.4°F | Resp 20 | Wt 343.4 lb

## 2015-08-16 DIAGNOSIS — Z95828 Presence of other vascular implants and grafts: Secondary | ICD-10-CM

## 2015-08-16 DIAGNOSIS — D701 Agranulocytosis secondary to cancer chemotherapy: Secondary | ICD-10-CM

## 2015-08-16 DIAGNOSIS — C541 Malignant neoplasm of endometrium: Secondary | ICD-10-CM | POA: Diagnosis not present

## 2015-08-16 DIAGNOSIS — D509 Iron deficiency anemia, unspecified: Secondary | ICD-10-CM

## 2015-08-16 DIAGNOSIS — C561 Malignant neoplasm of right ovary: Secondary | ICD-10-CM

## 2015-08-16 DIAGNOSIS — R112 Nausea with vomiting, unspecified: Secondary | ICD-10-CM

## 2015-08-16 DIAGNOSIS — G622 Polyneuropathy due to other toxic agents: Secondary | ICD-10-CM

## 2015-08-16 DIAGNOSIS — D696 Thrombocytopenia, unspecified: Secondary | ICD-10-CM

## 2015-08-16 DIAGNOSIS — G620 Drug-induced polyneuropathy: Secondary | ICD-10-CM

## 2015-08-16 DIAGNOSIS — C569 Malignant neoplasm of unspecified ovary: Secondary | ICD-10-CM

## 2015-08-16 DIAGNOSIS — T451X5A Adverse effect of antineoplastic and immunosuppressive drugs, initial encounter: Secondary | ICD-10-CM

## 2015-08-16 DIAGNOSIS — D6481 Anemia due to antineoplastic chemotherapy: Secondary | ICD-10-CM

## 2015-08-16 DIAGNOSIS — N183 Chronic kidney disease, stage 3 unspecified: Secondary | ICD-10-CM

## 2015-08-16 DIAGNOSIS — K219 Gastro-esophageal reflux disease without esophagitis: Secondary | ICD-10-CM

## 2015-08-16 DIAGNOSIS — C562 Malignant neoplasm of left ovary: Secondary | ICD-10-CM

## 2015-08-16 LAB — CBC WITH DIFFERENTIAL/PLATELET
BASO%: 0.1 % (ref 0.0–2.0)
Basophils Absolute: 0 10*3/uL (ref 0.0–0.1)
EOS ABS: 0 10*3/uL (ref 0.0–0.5)
EOS%: 0.1 % (ref 0.0–7.0)
HEMATOCRIT: 25.2 % — AB (ref 34.8–46.6)
HEMOGLOBIN: 8.2 g/dL — AB (ref 11.6–15.9)
LYMPH#: 2.2 10*3/uL (ref 0.9–3.3)
LYMPH%: 15.8 % (ref 14.0–49.7)
MCH: 32.9 pg (ref 25.1–34.0)
MCHC: 32.5 g/dL (ref 31.5–36.0)
MCV: 101.2 fL — AB (ref 79.5–101.0)
MONO#: 1.1 10*3/uL — AB (ref 0.1–0.9)
MONO%: 7.9 % (ref 0.0–14.0)
NEUT#: 10.7 10*3/uL — ABNORMAL HIGH (ref 1.5–6.5)
NEUT%: 76.1 % (ref 38.4–76.8)
PLATELETS: 141 10*3/uL — AB (ref 145–400)
RBC: 2.49 10*6/uL — AB (ref 3.70–5.45)
RDW: 18.9 % — ABNORMAL HIGH (ref 11.2–14.5)
WBC: 14 10*3/uL — ABNORMAL HIGH (ref 3.9–10.3)

## 2015-08-16 LAB — COMPREHENSIVE METABOLIC PANEL
ALBUMIN: 3.4 g/dL — AB (ref 3.5–5.0)
ALT: <9 U/L (ref 0–55)
ANION GAP: 10 meq/L (ref 3–11)
AST: 10 U/L (ref 5–34)
Alkaline Phosphatase: 106 U/L (ref 40–150)
BUN: 20.8 mg/dL (ref 7.0–26.0)
CALCIUM: 9.1 mg/dL (ref 8.4–10.4)
CO2: 23 meq/L (ref 22–29)
CREATININE: 1.2 mg/dL — AB (ref 0.6–1.1)
Chloride: 108 mEq/L (ref 98–109)
EGFR: 61 mL/min/{1.73_m2} — AB (ref >90)
Glucose: 130 mg/dl (ref 70–140)
Potassium: 4.2 mEq/L (ref 3.5–5.1)
Sodium: 141 mEq/L (ref 136–145)
TOTAL PROTEIN: 6.9 g/dL (ref 6.4–8.3)

## 2015-08-16 MED ORDER — HEPARIN SOD (PORK) LOCK FLUSH 100 UNIT/ML IV SOLN
500.0000 [IU] | Freq: Once | INTRAVENOUS | Status: AC | PRN
  Administered 2015-08-16: 500 [IU] via INTRAVENOUS
  Filled 2015-08-16: qty 5

## 2015-08-16 MED ORDER — GABAPENTIN 100 MG PO CAPS: 100.0000 mg | ORAL_CAPSULE | Freq: Three times a day (TID) | ORAL | Status: DC

## 2015-08-16 MED ORDER — GABAPENTIN 100 MG PO CAPS: 100.0000 mg | ORAL_CAPSULE | Freq: Two times a day (BID) | ORAL | Status: DC

## 2015-08-16 MED ORDER — SODIUM CHLORIDE 0.9 % IJ SOLN
10.0000 mL | INTRAMUSCULAR | Status: DC | PRN
  Administered 2015-08-16: 10 mL via INTRAVENOUS
  Filled 2015-08-16: qty 10

## 2015-08-16 MED FILL — GABAPENTIN 100 MG CAPSULE: 100 | 30 days supply | Qty: 90 | Fill #0

## 2015-08-16 NOTE — Telephone Encounter (Signed)
appt made and avs printed °

## 2015-08-16 NOTE — Patient Instructions (Signed)

## 2015-08-16 NOTE — Progress Notes (Signed)
OFFICE PROGRESS NOTE   Aug 16, 2015   Physicians:Emma Rexford Maus, Kayleen Memos, MD (PCP),  INTERVAL HISTORY:   Patient is seen, alone for visit, in follow up of adjuvant chemotherapy used for IIB carcinosarcoma of right ovary. She had cycle 6 carboplatin taxol given 08-02-15, with neulasta by on pro injector. Plan is for restaging CT AP on 08-23-15 (no IV contrast due to renal concerns and history of ARF) and follow up with Dr Denman George on 08-31-15. She will also have genetics counseling upcoming.  Patient had less nausea with cycle 6 after addition of EMEND to antiemetics. Again she had severe aches from taxol and gCSF, and has increased neuropathy symptoms primarily in feet. She has been out of gabapentin for over a week after she increased this to bid dosing; she was not too sedated with that schedule. She has been quite fatigued still, tho she tried to walk outdoors yesterday. She has had no bleeding, no LE swelling, is not SOB at rest, no fever, no problems with PAC. Bowels have moved adequately.  Remainder of 10 point Review of Systems negative/ unchanged.    CA 125 on 02-09-15 was 21.8 PAC by IR 04-11-15 Flu vaccine 12-12-14 Feraheme x1 on 06-14-15, not planning second dose but now off oral iron. Genetics counseling scheduled for 08-28-15  ONCOLOGIC HISTORY Patient had intermittent right lower quadrant abdominal pain since Aug 2016, with pelvic US 11-30-14 with uterine fibroids (11.2 x 4.8 x 6.8 cm. 1.7 x 1.7 x 2.0 cm subserosal fibroid in the right uterine body. 6.3 x 3.6 x 5.7 cm probable exophytic/pedunculated fibroid along the anterior uterine fundus), endometrium poorly visualized, right ovary poorly visualized but measured 4 x 2.8 x 4.4 cm and left ovary 4.9 x 3.4 x 4.9 cm with probable corpus luteal cyst called. Note patient is morbidly obese, with BMI >60. She had some fevers during that time. Patient was admitted to [facility] 02-18-15 thru 02-21-15 with pyelonephritis and acute renal failure  (creatinine 12.4), both of those problems resolving entirely with treatment. CT AP and MRI pelvis done to evaluate the pyelonephritis demonstrated cystic and solid pelvic mass appearing to be arising from the right adnexa 13 x 9 x 17.5 cm, with a second cystic and solid lesion within the left adnexa measuring 4.4 x 6.5 x 5.3 cm. A third cystic and solid mass was seen anterior to the uterus and superior to the bladder measuring 4.2 x 5.4 x 5.3 cm. She had no pelvic lymphadenopathy, no carcinomatosis, and CA 125 was normal at 21 U/mL. She was seen in consultation by Dr Denman George, with exploratory laparotomy 03-23-15 with TAH BSO, partial small bowel resection and radical debulking, with additional ventral hernia repair. She was hospitalized with the surgery from 12-6 thru 03-16-15, discharged on prophylactic lovenox which she continues, no post op complications in hospital. Jodell Cipro were removed on 03-27-15, some separation of wound with wound care continuing by patient at home. Surgical pathology [id number]) was reviewed at [facility], with final diagnosis of IIB carcinosarcoma of right ovary with involvement of left ovary and uterine serosa, and IA grade 1 endometrioid adenocarcinoma of uterus. She saw Dr Denman George 03-29-15, with recommendation for adjuvant chemotherapy and follow up with gyn oncology expected after 6 cycles of chemotherapy. Cycle 1 carbo taxol given 04-12-15, carbo capped at 750 mg and taxol dosed at 175 mg/m2 using actual weight. She was neutropenic with ANCX 0.1 on day 12 cycle 1. Doses same for cycle 2, granix begun day 6, ANC 0.2  on day 8. On pro neulasta used cycle 4, after dose delay and carbo decreased for thrombocytopenia. EMEND was helpful with nausea with cycle 6 on 08-02-15.     Objective:  Vital signs in last 24 hours:  BP 131/67 mmHg  Pulse 103  Temp(Src) 98.4 F (36.9 C) (Oral)  Resp 20  Wt 343 lb 7 oz (155.782 kg)  SpO2 100%  LMP 10/16/2013 (Approximate)  BMI  55.8 Weight stable.  Alert, oriented and appropriate. Ambulatory with effort due to morbid obesity. Respirations not labored at rest RA. Marland Kitchen  Alopecia  HEENT:PERRL, sclerae not icteric. Oral mucosa moist without lesions, posterior pharynx clear.  Neck supple. No JVD.  Lymphatics:no supraclavicular adenopathy Resp: clear to auscultation bilaterally and normal percussion bilaterally Cardio: regular rate and rhythm. No gallop. GI: abdomen obese, soft, nontender, not distended, cannot appreciate mass or organomegaly. A few bowel sounds. Surgical incision not remarkable. Musculoskeletal/ Extremities: without pitting edema, cords, tenderness Neuro: moderate peripheral neuropathy feet > hands. Otherwise nonfocal Skin without rash, ecchymosis, petechiae Portacath-without erythema or tenderness  Lab Results:  Results for orders placed or performed in visit on 08/16/15  CBC with Differential  Result Value Ref Range   WBC 14.0 (H) 3.9 - 10.3 10e3/uL   NEUT# 10.7 (H) 1.5 - 6.5 10e3/uL   HGB 8.2 (L) 11.6 - 15.9 g/dL   HCT 25.2 (L) 34.8 - 46.6 %   Platelets 141 (L) 145 - 400 10e3/uL   MCV 101.2 (H) 79.5 - 101.0 fL   MCH 32.9 25.1 - 34.0 pg   MCHC 32.5 31.5 - 36.0 g/dL   RBC 2.49 (L) 3.70 - 5.45 10e6/uL   RDW 18.9 (H) 11.2 - 14.5 %   lymph# 2.2 0.9 - 3.3 10e3/uL   MONO# 1.1 (H) 0.1 - 0.9 10e3/uL   Eosinophils Absolute 0.0 0.0 - 0.5 10e3/uL   Basophils Absolute 0.0 0.0 - 0.1 10e3/uL   NEUT% 76.1 38.4 - 76.8 %   LYMPH% 15.8 14.0 - 49.7 %   MONO% 7.9 0.0 - 14.0 %   EOS% 0.1 0.0 - 7.0 %   BASO% 0.1 0.0 - 2.0 %  Comprehensive metabolic panel  Result Value Ref Range   Sodium 141 136 - 145 mEq/L   Potassium 4.2 3.5 - 5.1 mEq/L   Chloride 108 98 - 109 mEq/L   CO2 23 22 - 29 mEq/L   Glucose 130 70 - 140 mg/dl   BUN 20.8 7.0 - 26.0 mg/dL   Creatinine 1.2 (H) 0.6 - 1.1 mg/dL   Total Bilirubin <0.30 0.20 - 1.20 mg/dL   Alkaline Phosphatase 106 40 - 150 U/L   AST 10 5 - 34 U/L   ALT <9 0 - 55  U/L   Total Protein 6.9 6.4 - 8.3 g/dL   Albumin 3.4 (L) 3.5 - 5.0 g/dL   Calcium 9.1 8.4 - 10.4 mg/dL   Anion Gap 10 3 - 11 mEq/L   EGFR 61 (L) >90 ml/min/1.73 m2     Studies/Results:  No results found.  CT AP ordered without IV contrast due to renal insufficiency and history of ARF.  Medications: I have reviewed the patient's current medications. Refill gabapentin 100 mg which she can use up to tid. That dose could be increased if needed.  DISCUSSION Peripheral neuropathy, aches and fatigue since last planned chemo discussed. Resume gabapentin, ok to use up to TID, and hopefully time out from chemo will allow improvement. Discussed best footwear. Fatigue multifactorial, but further decrease in  hemoglobin likely contributing; note she had feraheme x1 (iron studies not markedly low but unable to tolerate po iron). If she is NED by reevaluation upcoming, she would be interested in [place].      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 Pathology reviewed at [facility]. Taxol carboplatin begun 04-12-15, cycle 6 given 08-02-15. Course has been complicated by severe taxol aches and cytopenias despite gCSF, which have limited dosing, and significantly increased peripheral neuropathy with cycle 6.  I will see her again 6-8 weeks, coordinating with PAC flush then. Genetics referral.  Follow up scans without IV contrast due to renal problems, and with gyn oncology as planned. Plan to refer for GOG 0225 if NED at completion of therapy.  2.synchronous IA grade 1 endometrioid adenocarcinoma of uterus, treated surgically 3. Chemo neutropenia during treatment despite neulasta by on pro, fortunately no neutropenic infections. Chemo thrombocytopenia despite dose delays and adjustment in carboplatin, no bleeding. Multifactorial anemia including chemo  anemia, Follow counts with visit back to Dr Denman George and with next med onc visit.   4.morbid obesity, BMI 55.8. This obviously impacts tolerance for the anemia and increases risk for the gyn malignancy. Patient motivated to address this. 5.chemo peripheral neuropathy related to taxol: resume gabapentin up to tid.  6.benign positional vertigo resolved 7.acute renal failure with creatinine 12.4 at presentation with pyelonephritis, resolved. Creatinine better with better hydration.  8.overdue mammograms, which we will address  9.perforated diverticular disease with temporary colostomy 2008.  10.flu vaccine 12-12-14 11. chemo nausea and vomiting improved with addition of EMEND to cycle 6 12. iron deficiency anemia: One dose feraheme 06-14-15, oral iron DCd. f/u colonoscopy information. Chemo anemia 13.history of PCN allergy, which was not an anaphylactic reaction.  14.financial concerns: appreciate assistance from [facility]  15.increased GERD: better on protonix 16.PAC flipped 05-10-15, manipulated back into correct position, follow up dye study fine. Peripheral IV access very difficult. Will keep PAC for now, use also for blood draws. 17.degenerative arthritis symptomatic in knees: has follow up with PCP upcoming, OK from chemo standpoint for injections if needed. Weight loss would be most beneficial.  All questions answered and she knows to call prior to next scheduled visit if needed. Route PCP. Time spent 25 min including >50% counseling and coordination of care.    [name] P, MD   08/16/2015, 11:44 AM

## 2015-08-18 DIAGNOSIS — D6481 Anemia due to antineoplastic chemotherapy: Secondary | ICD-10-CM | POA: Insufficient documentation

## 2015-08-18 DIAGNOSIS — T451X5A Adverse effect of antineoplastic and immunosuppressive drugs, initial encounter: Secondary | ICD-10-CM

## 2015-08-21 ENCOUNTER — Ambulatory Visit (INDEPENDENT_AMBULATORY_CARE_PROVIDER_SITE_OTHER): Payer: BLUE CROSS/BLUE SHIELD | Admitting: Family Medicine

## 2015-08-21 ENCOUNTER — Encounter: Payer: Self-pay | Admitting: Family Medicine

## 2015-08-21 VITALS — BP 127/70 | HR 89 | Temp 98.0°F | Ht 65.0 in | Wt 342.6 lb

## 2015-08-21 DIAGNOSIS — M25561 Pain in right knee: Secondary | ICD-10-CM

## 2015-08-21 DIAGNOSIS — G620 Drug-induced polyneuropathy: Secondary | ICD-10-CM | POA: Diagnosis not present

## 2015-08-21 DIAGNOSIS — T451X5A Adverse effect of antineoplastic and immunosuppressive drugs, initial encounter: Secondary | ICD-10-CM

## 2015-08-21 DIAGNOSIS — Z6841 Body Mass Index (BMI) 40.0 and over, adult: Secondary | ICD-10-CM

## 2015-08-21 DIAGNOSIS — R479 Unspecified speech disturbances: Secondary | ICD-10-CM

## 2015-08-21 DIAGNOSIS — C561 Malignant neoplasm of right ovary: Secondary | ICD-10-CM | POA: Diagnosis not present

## 2015-08-21 DIAGNOSIS — I10 Essential (primary) hypertension: Secondary | ICD-10-CM

## 2015-08-21 DIAGNOSIS — R4789 Other speech disturbances: Secondary | ICD-10-CM

## 2015-08-21 MED ORDER — METHYLPREDNISOLONE ACETATE 40 MG/ML IJ SUSP
40.0000 mg | Freq: Once | INTRAMUSCULAR | Status: AC
Start: 2015-08-21 — End: 2015-08-21
  Administered 2015-08-21: 40 mg via INTRA_ARTICULAR

## 2015-08-21 NOTE — Progress Notes (Signed)
Subjective:    [name] is a 50 y.o. female who presents to [facility] today for bilateral knee pain:  1.  Bilateral knee pain:  Patient is describing knee pain bilaterally. Her right knee is worse. She is currently receiving chemotherapy for ovarian cancer and was unsure if she was able to have corticosteroid injections into her knee. These have helped for her previously in the past. What is new for she is describing some numbness in her legs and feet. However despite the numbness she states she still feels pain.  She was prescribed gabapentin by her oncologist is only been taking this twice daily. This is still very low dose at only 200 mg a day. Does not really feel much relief from her gabapentin use.  She would like to start an active exercise program to lose weight but is concerned about the degree of her right knee pain. She is interested in having another corticosteroid injection into her knee today since she finished her chemotherapy about 3 weeks ago.  Of note she's not had any bruising or bleeding. No epistaxis or gum bleeding. She does have some prolonged fatigue and has not been taking iron. Does have restless leg syndrome at night.  ROS as above per HPI, otherwise neg.    The following portions of the patient's history were reviewed and updated as appropriate: allergies, current medications, past medical history, family and social history, and problem list. Patient is a nonsmoker.    PMH reviewed.  Medications reviewed.    Objective:   Physical Exam BP 127/70 mmHg  Pulse 89  Temp(Src) 98 F (36.7 C) (Oral)  Ht 5\' 5"  (1.651 m)  Wt 342 lb 9.6 oz (155.402 kg)  BMI 57.01 kg/m2  LMP 10/16/2013 (Approximate) Gen:  Alert, cooperative patient who appears stated age in no acute distress.  Vital signs reviewed. HEENT: EOMI, PERRL. MMM Cardiac:  Regular rate and rhythm  Pulm:  Clear to auscultation bilaterally MSK:  Anatomy of knee distorted by her morbid obesity.  Does have  some joint line tenderness both medially and laterally.  No joint laxity noted.  Unable to palpate any effusion  Neuro:  Some word finding difficulty.  Ambulates with cane.  Speaks slowly.  Rest of neuro exam is nonfocal. Psych:  Flat affect for much of visit, though occasionally would spontaneously smile.   No results found for this or any previous visit (from the past 72 hour(s)).

## 2015-08-21 NOTE — Progress Notes (Signed)
Patient ID: [name], female   DOB: Sep 27, 1965, 50 y.o.   MRN: [id number]   CSW consult from Dr. Mingo Amber to talk with patient about financial concerns and connecting her with a Education officer, museum at [employer]     Patient is pleasant and engaged in conversation.  She was employed on current job 14 years.  Has been out of work since Oct 2016 with no income.  Lives with daughter and has a very supportive extended family.   Patient is experiencing stress related to lack of finances, not being able to return to work, concerns related to her 401K, chemotherapy and adjustment to her cancer diagnosis.  CSW provided emotional support and explained Integrated Care Service, also provided patient with contact numbers for agencies that provides finance assistance as well as the name and number of the social worker at the [employer]      Patient could benefit from brief therapeutic intervention to assist with goal setting, relaxation and adjustment to her diagnosis. Patient is open and willing to return to [facility] to work on the above goals.  Patient's homework assignment is to contact her 401K to obtain information to assist her with making an informed decision. She schedule an appointment next week with INT. Patient was appreciative of talking with CSW and will also reach out to social worker at the [facility].   [name]. [place] Work,  [phone] 11:40 AM

## 2015-08-21 NOTE — Patient Instructions (Addendum)
Knee injection today. You should start noticing a difference as the week goes on.  Keep moving more like you have been.  Take the Gabapentin three times a day.   Come back and see me in about 2 months or so.    It was good to see you today!

## 2015-08-22 DIAGNOSIS — R4789 Other speech disturbances: Secondary | ICD-10-CM | POA: Insufficient documentation

## 2015-08-22 HISTORY — DX: Other speech disturbances: R47.89

## 2015-08-22 NOTE — Assessment & Plan Note (Signed)
Followed by Dr. Marko Plume with Oncology. Last treatment was about 3 weeks ago.   Has FU appt already scheduled.   Feels some of her energy starting to come back. She is concerned because she has some much work. I had her touch base with her social worker her here about future job status.

## 2015-08-22 NOTE — Assessment & Plan Note (Signed)
Likely secondary to chemotherapy. She has follow-up artery schedule with her oncologist in about a week. We'll closely monitor this and see how much of her speech begins to come back. She does not have any other focal deficits noted on exam today. She does appear a little bit more flat than usual. This could also be secondary to oxycodone use. She states she has not taken one today however.

## 2015-08-22 NOTE — Assessment & Plan Note (Signed)
At goal today. No changes. 

## 2015-08-22 NOTE — Assessment & Plan Note (Signed)
She has started mild exercise program of walking on her sidewalk. Hopeful that knee injection will help.  She is going to follow up with me in about a month and we'll see how her pain is doing. Right now, she is taking Oxycodone for pain relief.

## 2015-08-22 NOTE — Assessment & Plan Note (Signed)
Persists. I have increased her gabapentin up to the prescribed 3 times a day. This is still low dose and her GFR can handle a somewhat higher dose. However will see how she does with 3 times a day dosing of gabapentin first.

## 2015-08-22 NOTE — Assessment & Plan Note (Signed)
Informed consent obtained and placed in chart.  Time out performed.  Area cleaned with iodine x 3 and wiped clear with alcohol swab.  Using 25 gauge needle 1 cc Kenalog and 3 cc's 1% Lidocaine were injected into Right knee via lateral approach.  Sterile bandage placed.  Patient tolerated procedure well.  No complications.  No bleeding.

## 2015-08-23 ENCOUNTER — Observation Stay (HOSPITAL_COMMUNITY)
Admission: AD | Admit: 2015-08-23 | Discharge: 2015-08-24 | DRG: 176 | Disposition: A | Discharge status: Home / Self Care | Payer: BLUE CROSS/BLUE SHIELD | Source: Ambulatory Visit | Attending: Internal Medicine | Admitting: Internal Medicine

## 2015-08-23 ENCOUNTER — Other Ambulatory Visit: Payer: Self-pay | Admitting: Oncology

## 2015-08-23 ENCOUNTER — Encounter: Payer: Self-pay | Admitting: Oncology

## 2015-08-23 ENCOUNTER — Encounter (HOSPITAL_COMMUNITY): Payer: Self-pay

## 2015-08-23 ENCOUNTER — Telehealth: Payer: Self-pay | Admitting: Oncology

## 2015-08-23 ENCOUNTER — Inpatient Hospital Stay (HOSPITAL_COMMUNITY): Payer: BLUE CROSS/BLUE SHIELD

## 2015-08-23 ENCOUNTER — Ambulatory Visit (HOSPITAL_BASED_OUTPATIENT_CLINIC_OR_DEPARTMENT_OTHER): Payer: BLUE CROSS/BLUE SHIELD | Admitting: Oncology

## 2015-08-23 ENCOUNTER — Ambulatory Visit (HOSPITAL_COMMUNITY)
Admission: RE | Admit: 2015-08-23 | Discharge: 2015-08-23 | Disposition: A | Discharge status: Home / Self Care | Payer: BLUE CROSS/BLUE SHIELD | Source: Ambulatory Visit | Attending: Oncology | Admitting: Oncology

## 2015-08-23 VITALS — BP 146/75 | HR 82 | Temp 98.3°F | Resp 18 | Ht 65.0 in | Wt 346.2 lb

## 2015-08-23 DIAGNOSIS — C569 Malignant neoplasm of unspecified ovary: Secondary | ICD-10-CM | POA: Diagnosis not present

## 2015-08-23 DIAGNOSIS — D701 Agranulocytosis secondary to cancer chemotherapy: Secondary | ICD-10-CM | POA: Diagnosis present

## 2015-08-23 DIAGNOSIS — M1712 Unilateral primary osteoarthritis, left knee: Secondary | ICD-10-CM | POA: Diagnosis not present

## 2015-08-23 DIAGNOSIS — K219 Gastro-esophageal reflux disease without esophagitis: Secondary | ICD-10-CM | POA: Diagnosis present

## 2015-08-23 DIAGNOSIS — T451X5A Adverse effect of antineoplastic and immunosuppressive drugs, initial encounter: Secondary | ICD-10-CM | POA: Diagnosis not present

## 2015-08-23 DIAGNOSIS — R609 Edema, unspecified: Secondary | ICD-10-CM

## 2015-08-23 DIAGNOSIS — C562 Malignant neoplasm of left ovary: Secondary | ICD-10-CM | POA: Diagnosis not present

## 2015-08-23 DIAGNOSIS — Z807 Family history of other malignant neoplasms of lymphoid, hematopoietic and related tissues: Secondary | ICD-10-CM

## 2015-08-23 DIAGNOSIS — D6481 Anemia due to antineoplastic chemotherapy: Secondary | ICD-10-CM

## 2015-08-23 DIAGNOSIS — I10 Essential (primary) hypertension: Secondary | ICD-10-CM | POA: Diagnosis present

## 2015-08-23 DIAGNOSIS — D509 Iron deficiency anemia, unspecified: Secondary | ICD-10-CM | POA: Diagnosis present

## 2015-08-23 DIAGNOSIS — I2699 Other pulmonary embolism without acute cor pulmonale: Secondary | ICD-10-CM | POA: Diagnosis present

## 2015-08-23 DIAGNOSIS — I82411 Acute embolism and thrombosis of right femoral vein: Secondary | ICD-10-CM | POA: Diagnosis present

## 2015-08-23 DIAGNOSIS — Z6841 Body Mass Index (BMI) 40.0 and over, adult: Secondary | ICD-10-CM

## 2015-08-23 DIAGNOSIS — I82401 Acute embolism and thrombosis of unspecified deep veins of right lower extremity: Secondary | ICD-10-CM

## 2015-08-23 DIAGNOSIS — Z8249 Family history of ischemic heart disease and other diseases of the circulatory system: Secondary | ICD-10-CM | POA: Diagnosis not present

## 2015-08-23 DIAGNOSIS — Z833 Family history of diabetes mellitus: Secondary | ICD-10-CM

## 2015-08-23 DIAGNOSIS — C561 Malignant neoplasm of right ovary: Secondary | ICD-10-CM | POA: Diagnosis not present

## 2015-08-23 DIAGNOSIS — N183 Chronic kidney disease, stage 3 unspecified: Secondary | ICD-10-CM

## 2015-08-23 DIAGNOSIS — D696 Thrombocytopenia, unspecified: Secondary | ICD-10-CM

## 2015-08-23 DIAGNOSIS — Z79899 Other long term (current) drug therapy: Secondary | ICD-10-CM

## 2015-08-23 DIAGNOSIS — H8110 Benign paroxysmal vertigo, unspecified ear: Secondary | ICD-10-CM | POA: Diagnosis present

## 2015-08-23 DIAGNOSIS — Z880 Allergy status to penicillin: Secondary | ICD-10-CM

## 2015-08-23 DIAGNOSIS — Z8542 Personal history of malignant neoplasm of other parts of uterus: Secondary | ICD-10-CM | POA: Diagnosis not present

## 2015-08-23 DIAGNOSIS — C541 Malignant neoplasm of endometrium: Secondary | ICD-10-CM

## 2015-08-23 DIAGNOSIS — D6959 Other secondary thrombocytopenia: Secondary | ICD-10-CM | POA: Diagnosis present

## 2015-08-23 DIAGNOSIS — G622 Polyneuropathy due to other toxic agents: Secondary | ICD-10-CM

## 2015-08-23 DIAGNOSIS — G620 Drug-induced polyneuropathy: Secondary | ICD-10-CM

## 2015-08-23 DIAGNOSIS — M25561 Pain in right knee: Secondary | ICD-10-CM | POA: Diagnosis present

## 2015-08-23 DIAGNOSIS — I82431 Acute embolism and thrombosis of right popliteal vein: Secondary | ICD-10-CM | POA: Diagnosis present

## 2015-08-23 DIAGNOSIS — N289 Disorder of kidney and ureter, unspecified: Secondary | ICD-10-CM

## 2015-08-23 LAB — COMPREHENSIVE METABOLIC PANEL
ALK PHOS: 81 U/L (ref 38–126)
ALT: 9 U/L — AB (ref 14–54)
AST: 12 U/L — ABNORMAL LOW (ref 15–41)
Albumin: 3.7 g/dL (ref 3.5–5.0)
Anion gap: 8 (ref 5–15)
BUN: 26 mg/dL — ABNORMAL HIGH (ref 6–20)
CALCIUM: 9.5 mg/dL (ref 8.9–10.3)
CO2: 24 mmol/L (ref 22–32)
CREATININE: 1.05 mg/dL — AB (ref 0.44–1.00)
Chloride: 107 mmol/L (ref 101–111)
Glucose, Bld: 95 mg/dL (ref 65–99)
Potassium: 4.5 mmol/L (ref 3.5–5.1)
Sodium: 139 mmol/L (ref 135–145)
Total Bilirubin: 0.6 mg/dL (ref 0.3–1.2)
Total Protein: 6.8 g/dL (ref 6.5–8.1)

## 2015-08-23 LAB — CBC
HEMATOCRIT: 23.4 % — AB (ref 36.0–46.0)
HEMOGLOBIN: 7.9 g/dL — AB (ref 12.0–15.0)
MCH: 34.6 pg — AB (ref 26.0–34.0)
MCHC: 33.8 g/dL (ref 30.0–36.0)
MCV: 102.6 fL — AB (ref 78.0–100.0)
Platelets: 124 10*3/uL — ABNORMAL LOW (ref 150–400)
RBC: 2.28 MIL/uL — AB (ref 3.87–5.11)
RDW: 17.9 % — ABNORMAL HIGH (ref 11.5–15.5)
WBC: 8.6 10*3/uL (ref 4.0–10.5)

## 2015-08-23 LAB — PROTIME-INR
INR: 1.19 (ref 0.00–1.49)
PROTHROMBIN TIME: 14.8 s (ref 11.6–15.2)

## 2015-08-23 MED ORDER — LORAZEPAM 0.5 MG PO TABS: 0.5000 mg | ORAL_TABLET | Freq: Four times a day (QID) | ORAL | Status: DC | PRN

## 2015-08-23 MED ORDER — PANTOPRAZOLE SODIUM 40 MG PO TBEC
40.0000 mg | DELAYED_RELEASE_TABLET | Freq: Every day | ORAL | Status: DC
  Administered 2015-08-23 – 2015-08-24 (×2): 40 mg via ORAL
  Filled 2015-08-23 (×2): qty 1

## 2015-08-23 MED ORDER — LORATADINE 10 MG PO TABS: 10.0000 mg | ORAL_TABLET | Freq: Every day | ORAL | Status: DC | PRN

## 2015-08-23 MED ORDER — IOPAMIDOL (ISOVUE-300) INJECTION 61%
100.0000 mL | Freq: Once | INTRAVENOUS | Status: AC | PRN
  Administered 2015-08-23: 80 mL via INTRAVENOUS

## 2015-08-23 MED ORDER — IOPAMIDOL (ISOVUE-300) INJECTION 61%: 100.0000 mL | Freq: Once | INTRAVENOUS | Status: DC | PRN

## 2015-08-23 MED ORDER — IOPAMIDOL (ISOVUE-370) INJECTION 76%
100.0000 mL | Freq: Once | INTRAVENOUS | Status: AC | PRN
  Administered 2015-08-23: 70 mL via INTRAVENOUS

## 2015-08-23 MED ORDER — POLYETHYLENE GLYCOL 3350 17 G PO PACK: 17.0000 g | PACK | Freq: Two times a day (BID) | ORAL | Status: DC | PRN

## 2015-08-23 MED ORDER — ENOXAPARIN SODIUM 150 MG/ML ~~LOC~~ SOLN
150.0000 mg | Freq: Once | SUBCUTANEOUS | Status: AC
  Administered 2015-08-23: 150 mg via SUBCUTANEOUS
  Filled 2015-08-23: qty 1

## 2015-08-23 MED ORDER — SODIUM CHLORIDE 0.9 % IV SOLN
INTRAVENOUS | Status: DC
  Administered 2015-08-23: 20:00:00 via INTRAVENOUS

## 2015-08-23 MED ORDER — ACETAMINOPHEN 500 MG PO TABS: 1000.0000 mg | ORAL_TABLET | Freq: Four times a day (QID) | ORAL | Status: DC | PRN

## 2015-08-23 MED ORDER — OXYCODONE HCL 5 MG PO TABS: 5.0000 mg | ORAL_TABLET | ORAL | Status: DC | PRN

## 2015-08-23 MED ORDER — ENOXAPARIN (LOVENOX) PATIENT EDUCATION KIT
PACK | Freq: Once | Status: AC
  Administered 2015-08-23: 16:00:00
  Filled 2015-08-23: qty 1

## 2015-08-23 MED ORDER — OXYCODONE HCL 5 MG PO TABS
5.0000 mg | ORAL_TABLET | ORAL | Status: DC | PRN
  Administered 2015-08-24: 5 mg via ORAL
  Filled 2015-08-23: qty 1

## 2015-08-23 MED ORDER — GABAPENTIN 100 MG PO CAPS
100.0000 mg | ORAL_CAPSULE | Freq: Three times a day (TID) | ORAL | Status: DC
  Administered 2015-08-23 – 2015-08-24 (×3): 100 mg via ORAL
  Filled 2015-08-23 (×3): qty 1

## 2015-08-23 MED ORDER — SODIUM CHLORIDE 0.9% FLUSH
3.0000 mL | Freq: Two times a day (BID) | INTRAVENOUS | Status: DC
Start: 2015-08-23 — End: 2015-08-24

## 2015-08-23 MED ORDER — SODIUM CHLORIDE 0.9% FLUSH
10.0000 mL | INTRAVENOUS | Status: DC | PRN
  Administered 2015-08-23: 20 mL
  Filled 2015-08-23: qty 40

## 2015-08-23 MED ORDER — ENOXAPARIN SODIUM 150 MG/ML ~~LOC~~ SOLN
150.0000 mg | Freq: Two times a day (BID) | SUBCUTANEOUS | Status: DC
  Administered 2015-08-23 – 2015-08-24 (×2): 150 mg via SUBCUTANEOUS
  Filled 2015-08-23 (×3): qty 1

## 2015-08-23 NOTE — Progress Notes (Signed)
ANTICOAGULATION CONSULT NOTE - Initial Consult Pharmacy Consult for Lovenox Indication: pulmonary embolus  Allergies  Allergen Reactions  . Penicillins Rash    Has patient had a PCN reaction causing immediate rash, facial/tongue/throat swelling, SOB or lightheadedness with hypotension: no Has patient had a PCN reaction causing severe rash involving mucus membranes or skin necrosis: no Has patient had a PCN reaction that required hospitalization in the hospital at the time Has patient had a PCN reaction occurring within the last 10 years: no If all of the above answers are "NO", then may proceed with Cephalosporin use.    Patient Measurements: Height: 5' 5.5" (166.4 cm) Weight: (!) 344 lb 8 oz (156.264 kg) IBW/kg (Calculated) : 58.15 Heparin Dosing Weight: 156 kg  Vital Signs: Temp: 98.3 F (36.8 C) (05/18 1300) Temp Source: Oral (05/18 1300) BP: 139/70 mmHg (05/18 1300) Pulse Rate: 79 (05/18 1300)  Labs: No results for input(s): HGB, HCT, PLT, APTT, LABPROT, INR, HEPARINUNFRC, HEPRLOWMOCWT, CREATININE, CKTOTAL, CKMB, TROPONINI in the last 72 hours.  Estimated Creatinine Clearance: 86.2 mL/min (by C-G formula based on Cr of 1.2).  Medical History: Past Medical History  Diagnosis Date  . Hypertension   . Hx of migraines   . IBS (irritable bowel syndrome)   . Diverticulitis with perforation 2010  . Family history of adverse reaction to anesthesia     pts daughter had difficulty awakening following anesthesia   . History of bronchitis   . History of ear infections   . Kidney infection   . GERD (gastroesophageal reflux disease)   . Headache   . Morbid obesity ([place])   . Anemia   . Pyelonephritis   . Cough   . Ovarian cancer ([place])   . Endometrial cancer ([place])    Medications:  Scheduled:  . enoxaparin   Does not apply Once  . gabapentin  100 mg Oral TID  . pantoprazole  40 mg Oral Daily  . sodium chloride flush  3 mL Intravenous Q12H   Assessment: 67 yoF with  Ovarian Ca, re-staging scans 5/18 noted R sided PE, confirmed with CT-angio. Doppler: RLE DVT, no obvious LLE DVT, technically limited due to body habitus.  Begin Lovenox.  Goal of Therapy:  Anti-Xa level 0.6-1 units/ml 4hrs after LMWH dose given Monitor platelets by anticoagulation protocol: Yes   Plan:   Lovenox 150mg  SQ bid  Monitor CBC, ordered in am  Plan anti-Xa level in obese patient  Minda Ditto PharmD Pager [phone] 08/23/2015, 5:35 PM

## 2015-08-23 NOTE — Progress Notes (Signed)
*  PRELIMINARY RESULTS* Vascular Ultrasound Lower extremity venous duplex has been completed.  Preliminary findings: technically limited due to body habitus. DVT noted in the right common femoral vein, femoral vein, and popliteal vein. Cannot visualize calf veins bilaterally. No obvious DVT LLE.   Called results to Tess, RN   Landry Mellow, RDMS, RVT  08/23/2015, 4:00 PM

## 2015-08-23 NOTE — Progress Notes (Signed)
MD Sign at close encounter Alison Levan, MD 08/23/2015 1:29 PM    Progress Notes    Expand All Collapse All   OFFICE PROGRESS NOTE MEDICAL ONCOLOGY  Aug 23, 2015   Physicians: Everitt Amber, Alveda Reasons, MD (PCP),L.Ebonye Reade   See also phone notes from earlier this AM.  INTERVAL HISTORY:  Patient is seen, together with daughter and mother, as restaging scans, done today for IIB carcinosarcoma of right ovary, unexpectedly show acute right pulmonary emboli. Patient has had no acute symptoms of PE and has no history of blood clots previously.  Patient completed last planned adjuvant chemotherapy for the right ovarian carcinoma on 08-02-15 (carboplatin taxol). She had CT CAP done this AM, with plan to follow up with gyn oncology on 08-31-15. The CT CAP was done with reduced amount of IV contrast due to history of ARF at presentation 02-2015; creatinine this AM was stable at 1.2 with EGFR 60; MD discussed IV contrast with radiology then. Radiologist noted question of right sided PE on that first scan, with CT angio chest done in follow up confirming acute central embolus at bifurcation of upper and lower lobe branches with more peripheral segmental emboli in RLL, left lung clear. She had no CT evidence of right heart strain.  Patient has had fatigue since last chemotherapy, which was not unexpected given amount of chemo, chemo anemia, morbid obesity and degenerative joint disease. She has had no increased SOB in past week, no chest pain, no cardiac symptoms, no swelling LE, no abdominal or pelvic pain. She has been eating, has minimal chemo related peripheral neuropathy in fingers, bowels and bladder ok, no bleeding, no fever, no problems with PAC.  She had steroid injection to right knee by PCP on 08-21-15, already improved symptoms. Remainder of 10 point Review of Systems negative.      CA 125 on 02-09-15 was 21.8 PAC by IR 04-11-15 Flu vaccine 12-12-14 Feraheme x1 on 06-14-15, not planning  second dose but now off oral iron. Genetics counseling scheduled for 08-28-15   ONCOLOGIC HISTORY Patient had intermittent right lower quadrant abdominal pain since Aug 2016, with pelvic US 11-30-14 with uterine fibroids (11.2 x 4.8 x 6.8 cm. 1.7 x 1.7 x 2.0 cm subserosal fibroid in the right uterine body. 6.3 x 3.6 x 5.7 cm probable exophytic/pedunculated fibroid along the anterior uterine fundus), endometrium poorly visualized, right ovary poorly visualized but measured 4 x 2.8 x 4.4 cm and left ovary 4.9 x 3.4 x 4.9 cm with probable corpus luteal cyst called. Note patient is morbidly obese, with BMI >60. She had some fevers during that time. Patient was admitted to [facility] 02-18-15 thru 02-21-15 with pyelonephritis and acute renal failure (creatinine 12.4), both of those problems resolving entirely with treatment. CT AP and MRI pelvis done to evaluate the pyelonephritis demonstrated cystic and solid pelvic mass appearing to be arising from the right adnexa 13 x 9 x 17.5 cm, with a second cystic and solid lesion within the left adnexa measuring 4.4 x 6.5 x 5.3 cm. A third cystic and solid mass was seen anterior to the uterus and superior to the bladder measuring 4.2 x 5.4 x 5.3 cm. She had no pelvic lymphadenopathy, no carcinomatosis, and CA 125 was normal at 21 U/mL. She was seen in consultation by Dr Denman George, with exploratory laparotomy 03-23-15 with TAH BSO, partial small bowel resection and radical debulking, with additional ventral hernia repair. She was hospitalized with the surgery from 12-6 thru 03-16-15, discharged on  prophylactic lovenox which she continues, no post op complications in hospital. Staples were removed on 03-27-15, some separation of wound with wound care continuing by patient at home. Surgical pathology [phone]) was reviewed at [facility] with final diagnosis of IIB carcinosarcoma of right ovary with involvement of left ovary and uterine serosa, and IA grade 1 endometrioid  adenocarcinoma of uterus. She saw Dr Denman George 03-29-15, with recommendation for adjuvant chemotherapy and follow up with gyn oncology expected after 6 cycles of chemotherapy. Cycle 1 carbo taxol given 04-12-15, carbo capped at 750 mg and taxol dosed at 175 mg/m2 using actual weight. She was neutropenic with ANCX 0.1 on day 12 cycle 1. Doses same for cycle 2, granix begun day 6, ANC 0.2 on day 8. On pro neulasta used cycle 4, after dose delay and carbo decreased for thrombocytopenia. EMEND was helpful with nausea with cycle 6 on 08-02-15.     Objective:  Vital signs in last 24 hours: 346 lbs, 146/ 75, HR 82 regular, resp 18 not labored RA, O2 sat 100%, 98.3.   Alert, oriented and appropriate. In wheelchair since CT angio chest. Respirations not labored RA.  Alopecia  HEENT:PERRL, sclerae not icteric. Mucous membranes pale. Oral mucosa moist without lesions, posterior pharynx clear.  Neck supple. No JVD.  Lymphatics:no cervical,supraclavicular adenopathy Resp: Somewhat diminished BS bilaterally otherwise clear to auscultation bilaterally and no dullness to percussion bilaterally Cardio: regular rate and rhythm. No gallop. GI: abdomen obese, soft, nontender, not distended, cannot appreciate mass or organomegaly. Normally active bowel sounds. Surgical incision not remarkable. Musculoskeletal/ Extremities: bilateral LE without pitting edema, cords, tenderness Neuro: minimal peripheral neuropathy fingertips. Otherwise nonfocal on exam in [facility]. PSYCH appropriate mood and affect Skin without rash, ecchymosis, petechiae Portacath-without erythema or tenderness. Accessed from CT, may not have correct disc at needle per [facility] RN.  Lab Results:  Results for orders placed or performed in visit on 08/16/15  CBC with Differential  Result Value Ref Range   WBC 14.0 (H) 3.9 - 10.3 10e3/uL   NEUT# 10.7 (H) 1.5 - 6.5 10e3/uL   HGB 8.2 (L) 11.6 - 15.9 g/dL   HCT 25.2 (L) 34.8 - 46.6 %    Platelets 141 (L) 145 - 400 10e3/uL   MCV 101.2 (H) 79.5 - 101.0 fL   MCH 32.9 25.1 - 34.0 pg   MCHC 32.5 31.5 - 36.0 g/dL   RBC 2.49 (L) 3.70 - 5.45 10e6/uL   RDW 18.9 (H) 11.2 - 14.5 %   lymph# 2.2 0.9 - 3.3 10e3/uL   MONO# 1.1 (H) 0.1 - 0.9 10e3/uL   Eosinophils Absolute 0.0 0.0 - 0.5 10e3/uL   Basophils Absolute 0.0 0.0 - 0.1 10e3/uL   NEUT% 76.1 38.4 - 76.8 %   LYMPH% 15.8 14.0 - 49.7 %   MONO% 7.9 0.0 - 14.0 %   EOS% 0.1 0.0 - 7.0 %   BASO% 0.1 0.0 - 2.0 %  Comprehensive metabolic panel  Result Value Ref Range   Sodium 141 136 - 145 mEq/L   Potassium 4.2 3.5 - 5.1 mEq/L   Chloride 108 98 - 109 mEq/L   CO2 23 22 - 29 mEq/L   Glucose 130 70 - 140 mg/dl   BUN 20.8 7.0 - 26.0 mg/dL   Creatinine 1.2 (H) 0.6 - 1.1 mg/dL   Total Bilirubin <0.30 0.20 - 1.20 mg/dL   Alkaline Phosphatase 106 40 - 150 U/L   AST 10 5 - 34 U/L   ALT <9 0 - 55  U/L   Total Protein 6.9 6.4 - 8.3 g/dL   Albumin 3.4 (L) 3.5 - 5.0 g/dL   Calcium 9.1 8.4 - 10.4 mg/dL   Anion Gap 10 3 - 11 mEq/L   EGFR 61 (L) >90 ml/min/1.73 m2     Studies/Results:   Imaging Results (Last 48 hours)    Ct Chest W Contrast  08/23/2015 CLINICAL DATA: Patient with history of endometrial and ovarian carcinoma status post chemotherapy. EXAM: CT CHEST, ABDOMEN, AND PELVIS WITH CONTRAST TECHNIQUE: Multidetector CT imaging of the chest, abdomen and pelvis was performed following the standard protocol during bolus administration of intravenous contrast. CONTRAST: 62m ISOVUE-300 IOPAMIDOL (ISOVUE-300) INJECTION 61% COMPARISON: Abdomen pelvic CT 04/09/2015 FINDINGS: CT CHEST Mediastinum/Nodes: Central venous catheter tip terminates in the right atrium. Normal heart size. Small amount of soft tissue within the anterior mediastinum likely residual thymus. Small hiatal hernia. Within the peripheral aspect  of the right main pulmonary artery (image 19; series 2) as well as within the right lower lobe pulmonary artery (image 30; series 2) there are bandlike filling defects identified. Lungs/Pleura: Central airways are patent. No large area of pulmonary consolidation. No pleural effusion or pneumothorax. Musculoskeletal: Thoracic spine degenerative changes. No aggressive or acute appearing osseous lesions. CT ABDOMEN AND PELVIS Hepatobiliary: Liver is normal in size and contour. No focal lesion is identified. Gallbladder is unremarkable. No intrahepatic or extrahepatic biliary ductal dilatation. Pancreas: Unremarkable Spleen: Unremarkable Adrenals/Urinary Tract: Normal adrenal glands. Kidneys are lobular in contour. No hydronephrosis. Urinary bladder is decompressed. Stomach/Bowel: No abnormal bowel wall thickening or evidence for bowel obstruction. Vascular/Lymphatic: Normal caliber abdominal aorta. No retroperitoneal lymphadenopathy. Other: Nonspecific fat stranding within the pelvis. Within the left hemipelvis there is a 2.5 x 1.6 cm soft tissue nodule (image 100; series 2), near the vaginal cuff. Within the right hemipelvis there is a 1.8 cm soft tissue nodule (image 98; series 2). Small amount of presacral free fluid. Musculoskeletal: Diastases of the rectus abdominus musculature containing non-obstructed small bowel. Significant interval decrease in size of previously described anterior abdominal wall fluid collection with residual minimal phlegmonous change measuring 3.7 x 2.0 cm. Lumbar spine degenerative changes. No aggressive or acute appearing osseous lesions. IMPRESSION: There is suggestion of possible filling defects within the right pulmonary artery raising the possibility of pulmonary embolism. Recommend dedicated evaluation of the pulmonary arteries with pulmonary artery CT. Two soft tissue nodules within the pelvis which are nonspecific and may represent postsurgical change. Recurrent disease is not  excluded. Recommend attention on follow-up. Interval decrease in size and near complete resolution of previously described anterior abdominal fluid collection. Critical Value/emergent results were called by telephone at the time of interpretation on 08/23/2015 at 9:50 am to Dr. LEvlyn Clines, who verbally acknowledged these results. Electronically Signed By: DLovey NewcomerM.D. On: 08/23/2015 10:04   Ct Angio Chest Pe W/cm &/or Wo Cm  08/23/2015 CLINICAL DATA: F/u prior scan done today to check for pe EXAM: CT ANGIOGRAPHY CHEST WITH CONTRAST TECHNIQUE: Multidetector CT imaging of the chest was performed using the standard protocol during bolus administration of intravenous contrast. Multiplanar CT image reconstructions and MIPs were obtained to evaluate the vascular anatomy. CONTRAST: 100 mL Isovue 370 IV COMPARISON: Earlier scan of the same day FINDINGS: Vascular: Contrast administered via right IJ power port, tip near the cavoatrial junction. Right atrium and right ventricle are nondilated. RV/LV ratio less than 0.9. There is good contrast opacification of pulmonary artery. Central embolus at the bifurcation of the right pulmonary artery  straddling the bifurcation into upper and lower lobe branches. More peripheral segmental emboli in the right lower lobe branch. No contralateral left pulmonary emboli are identified. Patent bilateral pulmonary veins drain into the left atrium. Adequate contrast opacification of the thoracic aorta with no evidence of dissection, aneurysm, or stenosis. There is classic 3-vessel brachiocephalic arch anatomy without proximal stenosis. Mediastinum/Lymph Nodes: No masses or pathologically enlarged lymph nodes identified. No pericardial effusion. Lungs/Pleura: Coarse linear scarring or atelectasis in the anterior right upper lobe. Lungs otherwise clear. No pleural effusion. No pneumothorax. Upper abdomen: Moderate hiatal hernia. Otherwise negative. Musculoskeletal: Anterior  vertebral endplate spurring at multiple levels in the mid and lower thoracic spine. Sternum intact. Review of the MIP images confirms the above findings. IMPRESSION: 1. Positive for acute right pulmonary emboli. No CT evidence of right heart strain. Critical Value/emergent results were called by telephone at the time of interpretation on 08/23/2015 at 11:30 am to Dr. Evlyn Clines , who verbally acknowledged these results. 2. Hiatal hernia. Electronically Signed By: Lucrezia Europe M.D. On: 08/23/2015 11:34   Ct Abdomen Pelvis W Contrast  08/23/2015 CLINICAL DATA: Patient with history of endometrial and ovarian carcinoma status post chemotherapy. EXAM: CT CHEST, ABDOMEN, AND PELVIS WITH CONTRAST TECHNIQUE: Multidetector CT imaging of the chest, abdomen and pelvis was performed following the standard protocol during bolus administration of intravenous contrast. CONTRAST: 44m ISOVUE-300 IOPAMIDOL (ISOVUE-300) INJECTION 61% COMPARISON: Abdomen pelvic CT 04/09/2015 FINDINGS: CT CHEST Mediastinum/Nodes: Central venous catheter tip terminates in the right atrium. Normal heart size. Small amount of soft tissue within the anterior mediastinum likely residual thymus. Small hiatal hernia. Within the peripheral aspect of the right main pulmonary artery (image 19; series 2) as well as within the right lower lobe pulmonary artery (image 30; series 2) there are bandlike filling defects identified. Lungs/Pleura: Central airways are patent. No large area of pulmonary consolidation. No pleural effusion or pneumothorax. Musculoskeletal: Thoracic spine degenerative changes. No aggressive or acute appearing osseous lesions. CT ABDOMEN AND PELVIS Hepatobiliary: Liver is normal in size and contour. No focal lesion is identified. Gallbladder is unremarkable. No intrahepatic or extrahepatic biliary ductal dilatation. Pancreas: Unremarkable Spleen: Unremarkable Adrenals/Urinary Tract: Normal adrenal glands. Kidneys are lobular in  contour. No hydronephrosis. Urinary bladder is decompressed. Stomach/Bowel: No abnormal bowel wall thickening or evidence for bowel obstruction. Vascular/Lymphatic: Normal caliber abdominal aorta. No retroperitoneal lymphadenopathy. Other: Nonspecific fat stranding within the pelvis. Within the left hemipelvis there is a 2.5 x 1.6 cm soft tissue nodule (image 100; series 2), near the vaginal cuff. Within the right hemipelvis there is a 1.8 cm soft tissue nodule (image 98; series 2). Small amount of presacral free fluid. Musculoskeletal: Diastases of the rectus abdominus musculature containing non-obstructed small bowel. Significant interval decrease in size of previously described anterior abdominal wall fluid collection with residual minimal phlegmonous change measuring 3.7 x 2.0 cm. Lumbar spine degenerative changes. No aggressive or acute appearing osseous lesions. IMPRESSION: There is suggestion of possible filling defects within the right pulmonary artery raising the possibility of pulmonary embolism. Recommend dedicated evaluation of the pulmonary arteries with pulmonary artery CT. Two soft tissue nodules within the pelvis which are nonspecific and may represent postsurgical change. Recurrent disease is not excluded. Recommend attention on follow-up. Interval decrease in size and near complete resolution of previously described anterior abdominal fluid collection. Critical Value/emergent results were called by telephone at the time of interpretation on 08/23/2015 at 9:50 am to Dr. LEvlyn Clines, who verbally acknowledged these results. Electronically Signed By:  Lovey Newcomer M.D. On: 08/23/2015 10:04     Medications: I have reviewed the patient's current medications.  DISCUSSION Reviewed CT findings of right sided PE, including PACs images. This appears acute, tho exact timing not clear from her history. I have recommended that she be observed inpatient as she begins anticoagulation now, and have  spoken with hospitalist service directly. Telemetry bed requested and available. Suggest IV heparin vs full dose lovenox / arixtra now and continue LMW heparin at DC. I expect lovenox will need to be dosed bid due to morbid obesity.  Discussed IV contrast x 2 given with scans today, with initial renal function ok today but history of ARF in 02-2015 so that IV hydration and repeat chemistries appropriate.  She used prophylactic lovenox after gyn onc surgery, patient and family members able to do injections.   She has not had CBC or any coags done today.  Note PAC may need to be reaccessed with correct disc at needle.  Above information discussed directly with admitting service.  Assessment/Plan:  1.acute right pulmonary emboli found unexpectedly on restaging scans today, in patient with ovarian carcinosarcoma diagnosed 02-2015, surgery 03-23-15, adjuvant chemotherapy thru 08-02-15, morbid obesity, arthritis left knee, sedentary. Admit to telemetry at least 24 hrs or longer if needed as anticoagulation begun. Hemodynamically stable. Appreciate collaboration with hospitalist physician. 2. 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 Pathology reviewed at [facility]. Taxol carboplatin begun 04-12-15, cycle 6 given 08-02-15. Course has been complicated by severe taxol aches and cytopenias despite gCSF, which have limited dosing, and significantly increased peripheral neuropathy with cycle 6. Restaging CT today shows small areas in pelvis that may be post surgical; to see Dr Denman George on 08-31-15 and I will update her on situation. Genetics counseling planned 08-28-15.  Plan to refer for GOG 0225 if NED at completion of therapy.  3.synchronous IA grade 1 endometrioid adenocarcinoma of uterus, treated surgically 4. acute renal failure with creatinine 12.4 at presentation with  pyelonephritis, resolved. Needs follow up renal function labs given IV contrast x2 today (dose for first scans was reduced) 5.Chemo neutropenia during treatment despite neulasta by on pro, fortunately no neutropenic infections. Chemo thrombocytopenia despite dose delays and adjustment in carboplatin, no bleeding. Multifactorial anemia including chemo anemia. Needs CBC diff in hospital today 6.morbid obesity, BMI 55.8. This obviously impacts tolerance for the anemia and increases risk for the gyn malignancy. Patient motivated to address this.7.Marland Kitchenchemo peripheral neuropathy related to taxol: resume gabapentin up to tid.  7.benign positional vertigo resolved 8.perforated diverticular disease with temporary colostomy 2008.  9 chemo nausea and vomiting improved with addition of EMEND to cycle 6 10. iron deficiency anemia: One dose feraheme 06-14-15, oral iron DCd. f/u colonoscopy information. Chemo anemia 11.history of PCN allergy, which was not an anaphylactic reaction.  12.financial concerns: appreciate assistance from [facility]  13.increased GERD: better on protonix 14.PAC flipped 05-10-15, manipulated back into correct position, follow up dye study fine. Peripheral IV access very difficult. Will keep PAC for now, use also for blood draws. 14.degenerative arthritis symptomatic in knees: better since steroid injection by Dr Mingo Amber on 08-21-15   I will follow with you. Please page me if needed [phone] Time spent 40 min including >50% coordination of care and counseling.  Alison Levan, MD  08/23/2015, 12:39 PM

## 2015-08-23 NOTE — H&P (Signed)
History and Physical    [name] [id number] DOB: May 30, 1965 DOA: 08/23/2015  Referring MD/NP/PA: Alex Gardener PCP: Annabell Sabal, MD  Chief Complaint: PE  HPI: [name] is a 50 y.o. female with PMH of Stage 2 Ovarian Ca, Morbid Obesity, Arthritis/DJD of knees R>L, was in radiology for restaging CT scans and was incidentally found to have suspected PE, subsequently had a CTA chest which showed R sided PE and was directly admitted at the request of Dr.Livesay. Pt reports feeling ok, except for her chronic knee aches/pains and neuropathy in her legs. She denies dyspnea, chest pain etc. Her mobility is limited due to neuropathy and knee pains, she just had cortisone injected in her R knee on Tuesday.   Review of Systems: As per HPI  And both knee pains and nerve pains otherwise 10 point review of systems negative.    Past Medical History  Diagnosis Date  . Hypertension   . Hx of migraines   . IBS (irritable bowel syndrome)   . Diverticulitis with perforation 2010  . Family history of adverse reaction to anesthesia     pts daughter had difficulty awakening following anesthesia   . History of bronchitis   . History of ear infections   . Kidney infection   . GERD (gastroesophageal reflux disease)   . Headache   . Morbid obesity ([place])   . Anemia   . Pyelonephritis   . Cough   . Ovarian cancer ([place])   . Endometrial cancer [facility])     Past Surgical History  Procedure Laterality Date  . Colostomy takedown    . Colostomy  2008  . Cesarean section  1983 and 1984  . Tonsillectomy and adenoidectomy  1997  . Laparotomy N/A 03/13/2015    Procedure: EXPLORATORY LAPAROTOMY;  Surgeon: Everitt Amber, MD;  Location: [facility];  Service: Gynecology;  Laterality: N/A;  . Abdominal hysterectomy N/A 03/13/2015    Procedure: TOTAL HYSTERECTOMY ABDOMINAL BILATERAL SALPINGO OOPHORECTOMY RADICAL TUMOR Erin;  Surgeon: Everitt Amber, MD;  Location: [facility];  Service: Gynecology;  Laterality:  N/A;  . Bowel resection  03/13/2015    Procedure: SMALL BOWEL RESECTION;  Surgeon: Everitt Amber, MD;  Location: [facility];  Service: Gynecology;;  . Ventral hernia repair  03/13/2015    Procedure: HERNIA REPAIR VENTRAL ADULT;  Surgeon: Everitt Amber, MD;  Location: [facility];  Service: Gynecology;;     reports that she has never smoked. She has never used smokeless tobacco. She reports that she drinks alcohol. She reports that she does not use illicit drugs.  Allergies  Allergen Reactions  . Penicillins Rash    Has patient had a PCN reaction causing immediate rash, facial/tongue/throat swelling, SOB or lightheadedness with hypotension: no Has patient had a PCN reaction causing severe rash involving mucus membranes or skin necrosis: no Has patient had a PCN reaction that required hospitalization in the hospital at the time Has patient had a PCN reaction occurring within the last 10 years: no If all of the above answers are "NO", then may proceed with Cephalosporin use.     Family History  Problem Relation Age of Onset  . Diabetes Mother   . Hypertension Mother   . Hyperlipidemia Mother   . Hyperlipidemia Father   . Cancer Daughter 18    Hodgkins  . Diabetes Daughter   . Heart disease Neg Hx   . Stroke Neg Hx      Prior to Admission medications   Medication Sig Start Date End  Date Taking? Authorizing Provider  acetaminophen (TYLENOL) 500 MG tablet Take 1,000 mg by mouth every 6 (six) hours as needed for mild pain, moderate pain, fever or headache.   Yes Historical Provider, MD  clobetasol cream (TEMOVATE) 0.05 % Apply topically 2 (two) times daily. Patient taking differently: Apply 1 application topically every other day.  12/12/14  Yes Alveda Reasons, MD  docusate sodium (COLACE) 100 MG capsule Take 100 mg by mouth daily as needed for mild constipation. Reported on 08/23/2015   Yes Historical Provider, MD  gabapentin (NEURONTIN) 100 MG capsule Take 1 capsule (100 mg total) by mouth 3 (three)  times daily. 08/16/15  Yes Lennis Marion Downer, MD  lidocaine-prilocaine (EMLA) cream Apply to PAC site 1-2 hours prior to access as needed. 03/30/15  Yes Lennis Marion Downer, MD  loratadine (CLARITIN) 10 MG tablet Take 10 mg by mouth daily as needed for allergies. Reported on 08/23/2015   Yes Historical Provider, MD  LORazepam (ATIVAN) 1 MG tablet Place 1/2 to 1 tablet under the tongue or swallow every 6 hours as needed for nausea. Will make drowsy. 07/30/15  Yes Lennis Marion Downer, MD  meclizine (ANTIVERT) 25 MG tablet Take 1 tablet (25 mg total) by mouth 3 (three) times daily as needed for dizziness. 06/27/15  Yes Larene Pickett, PA-C  oxyCODONE (OXY IR/ROXICODONE) 5 MG immediate release tablet Take 1-2 tablets (5-10 mg total) by mouth every 4 (four) hours as needed for severe pain. 07/30/15  Yes Lennis Marion Downer, MD  pantoprazole (PROTONIX) 40 MG tablet Take 1 tablet (40 mg total) by mouth daily. 05/10/15  Yes Lennis Marion Downer, MD  polyethylene glycol (MIRALAX / GLYCOLAX) packet Take 17 g by mouth 2 (two) times daily as needed for mild constipation. Reported on 08/23/2015   Yes Historical Provider, MD    Physical Exam: Filed Vitals:   08/23/15 1300  BP: 139/70  Pulse: 79  Temp: 98.3 F (36.8 C)  TempSrc: Oral  Resp: 18  Height: 5' 5.5" (1.664 m)  Weight: 156.264 kg (344 lb 8 oz)  SpO2: 100%      Constitutional: NAD, calm, comfortable, AAOx3, no distress, obese Filed Vitals:   08/23/15 1300  BP: 139/70  Pulse: 79  Temp: 98.3 F (36.8 C)  TempSrc: Oral  Resp: 18  Height: 5' 5.5" (1.664 m)  Weight: 156.264 kg (344 lb 8 oz)  SpO2: 100%   Eyes: PERRL, lids and conjunctivae normal ENMT: Mucous membranes are moist. Posterior pharynx clear of any exudate or lesions.Normal dentition.  Neck: normal, supple, no masses, no thyromegaly Respiratory: clear to auscultation bilaterally, no wheezing, no crackles. Normal respiratory effort. No accessory muscle use.  Cardiovascular: Regular rate and  rhythm, no murmurs / rubs / gallops. No extremity edema. 2+ pedal pulses. No carotid bruits.  Abdomen: no tenderness, no masses palpated. No hepatosplenomegaly. Bowel sounds positive.  Musculoskeletal: no clubbing / cyanosis. No joint deformity upper and lower extremities. Good ROM, no contractures. Normal muscle tone.  Skin: no rashes, lesions, ulcers. No induration Neurologic: CN 2-12 grossly intact. Sensation intact, DTR normal. Strength 5/5 in all 4.  Psychiatric: Normal judgment and insight. Alert and oriented x 3. Normal mood.   Labs on Admission: I have personally reviewed following labs and imaging studies  CBC: No results for input(s): WBC, NEUTROABS, HGB, HCT, MCV, PLT in the last 168 hours. Basic Metabolic Panel: No results for input(s): NA, K, CL, CO2, GLUCOSE, BUN, CREATININE, CALCIUM, MG, PHOS in the last 168 hours.  GFR: Estimated Creatinine Clearance: 86.2 mL/min (by C-G formula based on Cr of 1.2). Liver Function Tests: No results for input(s): AST, ALT, ALKPHOS, BILITOT, PROT, ALBUMIN in the last 168 hours. No results for input(s): LIPASE, AMYLASE in the last 168 hours. No results for input(s): AMMONIA in the last 168 hours. Coagulation Profile: No results for input(s): INR, PROTIME in the last 168 hours. Cardiac Enzymes: No results for input(s): CKTOTAL, CKMB, CKMBINDEX, TROPONINI in the last 168 hours. BNP (last 3 results) No results for input(s): PROBNP in the last 8760 hours. HbA1C: No results for input(s): HGBA1C in the last 72 hours. CBG: No results for input(s): GLUCAP in the last 168 hours. Lipid Profile: No results for input(s): CHOL, HDL, LDLCALC, TRIG, CHOLHDL, LDLDIRECT in the last 72 hours. Thyroid Function Tests: No results for input(s): TSH, T4TOTAL, FREET4, T3FREE, THYROIDAB in the last 72 hours. Anemia Panel: No results for input(s): VITAMINB12, FOLATE, FERRITIN, TIBC, IRON, RETICCTPCT in the last 72 hours. Urine analysis:    Component Value  Date/Time   COLORURINE AMBER* 03/14/2015 2118   APPEARANCEUR CLOUDY* 03/14/2015 2118   LABSPEC 1.015 04/23/2015 1247   LABSPEC 1.026 03/14/2015 2118   PHURINE 5.0 04/23/2015 1247   PHURINE 5.5 03/14/2015 2118   GLUCOSEU Negative 04/23/2015 1247   GLUCOSEU NEGATIVE 03/14/2015 2118   HGBUR Negative 04/23/2015 1247   HGBUR MODERATE* 03/14/2015 2118   BILIRUBINUR Color Interference 04/23/2015 1247   BILIRUBINUR NEGATIVE 03/14/2015 2118   BILIRUBINUR SMALL 11/17/2014 1000   KETONESUR Negative 04/23/2015 1247   KETONESUR NEGATIVE 03/14/2015 2118   PROTEINUR Color Interference 04/23/2015 1247   PROTEINUR NEGATIVE 03/14/2015 2118   PROTEINUR TRACE 11/17/2014 1000   UROBILINOGEN Color Interference 04/23/2015 1247   UROBILINOGEN 2.0* 02/18/2015 1720   UROBILINOGEN 0.2 11/17/2014 1000   NITRITE Color Interference 04/23/2015 1247   NITRITE NEGATIVE 03/14/2015 2118   NITRITE NEG 11/17/2014 1000   LEUKOCYTESUR Color Interference 04/23/2015 1247   LEUKOCYTESUR TRACE* 03/14/2015 2118   Sepsis Labs: @LABRCNTIP (procalcitonin:4,lacticidven:4) )No results found for this or any previous visit (from the past 240 hour(s)).   Radiological Exams on Admission: Ct Chest W Contrast  08/23/2015  CLINICAL DATA:  Patient with history of endometrial and ovarian carcinoma status post chemotherapy. EXAM: CT CHEST, ABDOMEN, AND PELVIS WITH CONTRAST TECHNIQUE: Multidetector CT imaging of the chest, abdomen and pelvis was performed following the standard protocol during bolus administration of intravenous contrast. CONTRAST:  33mL ISOVUE-300 IOPAMIDOL (ISOVUE-300) INJECTION 61% COMPARISON:  Abdomen pelvic CT 04/09/2015 FINDINGS: CT CHEST Mediastinum/Nodes: Central venous catheter tip terminates in the right atrium. Normal heart size. Small amount of soft tissue within the anterior mediastinum likely residual thymus. Small hiatal hernia. Within the peripheral aspect of the right main pulmonary artery (image 19; series  2) as well as within the right lower lobe pulmonary artery (image 30; series 2) there are bandlike filling defects identified. Lungs/Pleura: Central airways are patent. No large area of pulmonary consolidation. No pleural effusion or pneumothorax. Musculoskeletal: Thoracic spine degenerative changes. No aggressive or acute appearing osseous lesions. CT ABDOMEN AND PELVIS Hepatobiliary: Liver is normal in size and contour. No focal lesion is identified. Gallbladder is unremarkable. No intrahepatic or extrahepatic biliary ductal dilatation. Pancreas: Unremarkable Spleen: Unremarkable Adrenals/Urinary Tract: Normal adrenal glands. Kidneys are lobular in contour. No hydronephrosis. Urinary bladder is decompressed. Stomach/Bowel: No abnormal bowel wall thickening or evidence for bowel obstruction. Vascular/Lymphatic: Normal caliber abdominal aorta. No retroperitoneal lymphadenopathy. Other: Nonspecific fat stranding within the pelvis. Within the left hemipelvis there  is a 2.5 x 1.6 cm soft tissue nodule (image 100; series 2), near the vaginal cuff. Within the right hemipelvis there is a 1.8 cm soft tissue nodule (image 98; series 2). Small amount of presacral free fluid. Musculoskeletal: Diastases of the rectus abdominus musculature containing non-obstructed small bowel. Significant interval decrease in size of previously described anterior abdominal wall fluid collection with residual minimal phlegmonous change measuring 3.7 x 2.0 cm. Lumbar spine degenerative changes. No aggressive or acute appearing osseous lesions. IMPRESSION: There is suggestion of possible filling defects within the right pulmonary artery raising the possibility of pulmonary embolism. Recommend dedicated evaluation of the pulmonary arteries with pulmonary artery CT. Two soft tissue nodules within the pelvis which are nonspecific and may represent postsurgical change. Recurrent disease is not excluded. Recommend attention on follow-up. Interval  decrease in size and near complete resolution of previously described anterior abdominal fluid collection. Critical Value/emergent results were called by telephone at the time of interpretation on 08/23/2015 at 9:50 am to Dr. Evlyn Clines , who verbally acknowledged these results. Electronically Signed   By: Lovey Newcomer M.D.   On: 08/23/2015 10:04   Ct Angio Chest Pe W/cm &/or Wo Cm  08/23/2015  CLINICAL DATA:  F/u prior scan done today to check for pe EXAM: CT ANGIOGRAPHY CHEST WITH CONTRAST TECHNIQUE: Multidetector CT imaging of the chest was performed using the standard protocol during bolus administration of intravenous contrast. Multiplanar CT image reconstructions and MIPs were obtained to evaluate the vascular anatomy. CONTRAST:  100 mL Isovue 370 IV COMPARISON:  Earlier scan of the same day FINDINGS: Vascular: Contrast administered via right IJ power port, tip near the cavoatrial junction. Right atrium and right ventricle are nondilated. RV/LV ratio less than 0.9. There is good contrast opacification of pulmonary artery. Central embolus at the bifurcation of the right pulmonary artery straddling the bifurcation into upper and lower lobe branches. More peripheral segmental emboli in the right lower lobe branch. No contralateral left pulmonary emboli are identified. Patent bilateral pulmonary veins drain into the left atrium. Adequate contrast opacification of the thoracic aorta with no evidence of dissection, aneurysm, or stenosis. There is classic 3-vessel brachiocephalic arch anatomy without proximal stenosis. Mediastinum/Lymph Nodes: No masses or pathologically enlarged lymph nodes identified. No pericardial effusion. Lungs/Pleura: Coarse linear scarring or atelectasis in the anterior right upper lobe. Lungs otherwise clear. No pleural effusion. No pneumothorax. Upper abdomen: Moderate hiatal hernia.  Otherwise negative. Musculoskeletal: Anterior vertebral endplate spurring at multiple levels in the  mid and lower thoracic spine. Sternum intact. Review of the MIP images confirms the above findings. IMPRESSION: 1. Positive for acute right pulmonary emboli. No CT evidence of right heart strain. Critical Value/emergent results were called by telephone at the time of interpretation on 08/23/2015 at 11:30 am to Dr. Evlyn Clines , who verbally acknowledged these results. 2. Hiatal hernia. Electronically Signed   By: Lucrezia Europe M.D.   On: 08/23/2015 11:34   Ct Abdomen Pelvis W Contrast  08/23/2015  CLINICAL DATA:  Patient with history of endometrial and ovarian carcinoma status post chemotherapy. EXAM: CT CHEST, ABDOMEN, AND PELVIS WITH CONTRAST TECHNIQUE: Multidetector CT imaging of the chest, abdomen and pelvis was performed following the standard protocol during bolus administration of intravenous contrast. CONTRAST:  4mL ISOVUE-300 IOPAMIDOL (ISOVUE-300) INJECTION 61% COMPARISON:  Abdomen pelvic CT 04/09/2015 FINDINGS: CT CHEST Mediastinum/Nodes: Central venous catheter tip terminates in the right atrium. Normal heart size. Small amount of soft tissue within the anterior mediastinum likely residual thymus.  Small hiatal hernia. Within the peripheral aspect of the right main pulmonary artery (image 19; series 2) as well as within the right lower lobe pulmonary artery (image 30; series 2) there are bandlike filling defects identified. Lungs/Pleura: Central airways are patent. No large area of pulmonary consolidation. No pleural effusion or pneumothorax. Musculoskeletal: Thoracic spine degenerative changes. No aggressive or acute appearing osseous lesions. CT ABDOMEN AND PELVIS Hepatobiliary: Liver is normal in size and contour. No focal lesion is identified. Gallbladder is unremarkable. No intrahepatic or extrahepatic biliary ductal dilatation. Pancreas: Unremarkable Spleen: Unremarkable Adrenals/Urinary Tract: Normal adrenal glands. Kidneys are lobular in contour. No hydronephrosis. Urinary bladder is  decompressed. Stomach/Bowel: No abnormal bowel wall thickening or evidence for bowel obstruction. Vascular/Lymphatic: Normal caliber abdominal aorta. No retroperitoneal lymphadenopathy. Other: Nonspecific fat stranding within the pelvis. Within the left hemipelvis there is a 2.5 x 1.6 cm soft tissue nodule (image 100; series 2), near the vaginal cuff. Within the right hemipelvis there is a 1.8 cm soft tissue nodule (image 98; series 2). Small amount of presacral free fluid. Musculoskeletal: Diastases of the rectus abdominus musculature containing non-obstructed small bowel. Significant interval decrease in size of previously described anterior abdominal wall fluid collection with residual minimal phlegmonous change measuring 3.7 x 2.0 cm. Lumbar spine degenerative changes. No aggressive or acute appearing osseous lesions. IMPRESSION: There is suggestion of possible filling defects within the right pulmonary artery raising the possibility of pulmonary embolism. Recommend dedicated evaluation of the pulmonary arteries with pulmonary artery CT. Two soft tissue nodules within the pelvis which are nonspecific and may represent postsurgical change. Recurrent disease is not excluded. Recommend attention on follow-up. Interval decrease in size and near complete resolution of previously described anterior abdominal fluid collection. Critical Value/emergent results were called by telephone at the time of interpretation on 08/23/2015 at 9:50 am to Dr. Evlyn Clines , who verbally acknowledged these results. Electronically Signed   By: Lovey Newcomer M.D.   On: 08/23/2015 10:04      Assessment/Plan Active Problems:   Pulmonary embolism (HCC) -hemodynamically stable, admit to telemetry -Start Lovenox, Lovenox teaching, duration per Dr. Marko Plume - also check lower extremity Dopplers to rule out DVT  Anemia  -Secondary to chronic disease, iron deficiency and chemotherapy  -Labs pending at this time    neuropathy  secondary to chemotherapy -Continue gabapentin -Physical therapy evaluation  DJD/morbid obesity Status post recent cortisone injection the right knee  Stage II ovarian Ca -Status post debulking surgery followed by chemotherapy -Per Dr.Livesay  DVT prophylaxis:Lovenox Code Status: None at bedside Disposition Plan:home in ?2days Admission status: inpatient   Domenic Polite MD [facility] Pager [phone]  If 7PM-7AM, please contact night-coverage [email or web address] Password [email or web address]  08/23/2015, 3:10 PM

## 2015-08-23 NOTE — Progress Notes (Unsigned)
OFFICE PROGRESS NOTE MEDICAL ONCOLOGY  Aug 23, 2015   Physicians: Everitt Amber, Alveda Reasons, MD (PCP),L.Livesay  INTERVAL HISTORY:  Patient is seen, together with daughter and mother, as restaging scans, done today for IIB carcinosarcoma of right ovary, unexpectedly show acute right pulmonary emboli.  Patient has had no acute symptoms of PE and has no history of blood clots previously.  Patient completed last planned adjuvant chemotherapy for the right ovarian carcinoma on 08-02-15 (carboplatin taxol). She had CT CAP done this AM, with plan to follow up with gyn oncology on 08-31-15. The CT CAP was done with reduced amount of IV contrast due to history of ARF at presentation 02-2015; creatinine this AM was stable at 1.2 with EGFR 60; MD discussed IV contrast with radiology then. Radiologist noted question of right sided PE on that first scan, with CT angio chest done in follow up confirming acute central embolus at bifurcation of upper and lower lobe branches with more peripheral segmental emboli in RLL, left lung clear. She had no CT evidence of right heart strain.  Patient has had fatigue since last chemotherapy, which was not unexpected given amount of chemo, chemo anemia, morbid obesity and degenerative joint disease. She has had no increased SOB in past week, no chest pain, no cardiac symptoms, no swelling LE, no abdominal or pelvic pain. She has been eating, has minimal chemo related peripheral neuropathy in fingers, bowels and bladder ok, no bleeding, no fever, no problems with PAC.  She had steroid injection to right knee by PCP on 08-21-15, already improved symptoms. Remainder of 10 point Review of Systems negative.      CA 125 on 02-09-15 was 21.8 PAC by IR 04-11-15 Flu vaccine 12-12-14 Feraheme x1 on 06-14-15, not planning second dose but now off oral iron. Genetics counseling scheduled for 08-28-15   ONCOLOGIC HISTORY Patient had intermittent right lower quadrant abdominal pain  since Aug 2016, with pelvic US 11-30-14 with uterine fibroids (11.2 x 4.8 x 6.8 cm. 1.7 x 1.7 x 2.0 cm subserosal fibroid in the right uterine body. 6.3 x 3.6 x 5.7 cm probable exophytic/pedunculated fibroid along the anterior uterine fundus), endometrium poorly visualized, right ovary poorly visualized but measured 4 x 2.8 x 4.4 cm and left ovary 4.9 x 3.4 x 4.9 cm with probable corpus luteal cyst called. Note patient is morbidly obese, with BMI >60. She had some fevers during that time. Patient was admitted to [facility] 02-18-15 thru 02-21-15 with pyelonephritis and acute renal failure (creatinine 12.4), both of those problems resolving entirely with treatment. CT AP and MRI pelvis done to evaluate the pyelonephritis demonstrated cystic and solid pelvic mass appearing to be arising from the right adnexa 13 x 9 x 17.5 cm, with a second cystic and solid lesion within the left adnexa measuring 4.4 x 6.5 x 5.3 cm. A third cystic and solid mass was seen anterior to the uterus and superior to the bladder measuring 4.2 x 5.4 x 5.3 cm. She had no pelvic lymphadenopathy, no carcinomatosis, and CA 125 was normal at 21 U/mL. She was seen in consultation by Dr Denman George, with exploratory laparotomy 03-23-15 with TAH BSO, partial small bowel resection and radical debulking, with additional ventral hernia repair. She was hospitalized with the surgery from 12-6 thru 03-16-15, discharged on prophylactic lovenox which she continues, no post op complications in hospital. Jodell Cipro were removed on 03-27-15, some separation of wound with wound care continuing by patient at home. Surgical pathology [phone]) was reviewed at [facility], with final diagnosis of IIB carcinosarcoma of right ovary with involvement of left ovary and uterine serosa, and IA grade 1 endometrioid adenocarcinoma of uterus. She saw Dr Denman George 03-29-15, with recommendation for adjuvant chemotherapy and follow up with gyn oncology expected after 6 cycles of  chemotherapy. Cycle 1 carbo taxol given 04-12-15, carbo capped at 750 mg and taxol dosed at 175 mg/m2 using actual weight. She was neutropenic with ANCX 0.1 on day 12 cycle 1. Doses same for cycle 2, granix begun day 6, ANC 0.2 on day 8. On pro neulasta used cycle 4, after dose delay and carbo decreased for thrombocytopenia. EMEND was helpful with nausea with cycle 6 on 08-02-15.     Objective:  Vital signs in last 24 hours: 346 lbs, 146/ 75, HR 82 regular, resp 18 not labored RA, O2 sat 100%, 98.3.   Alert, oriented and appropriate. In wheelchair since CT angio chest. Respirations not labored RA.  Alopecia  HEENT:PERRL, sclerae not icteric. Mucous membranes pale. Oral mucosa moist without lesions, posterior pharynx clear.  Neck supple. No JVD.  Lymphatics:no cervical,supraclavicular adenopathy Resp: Somewhat diminished BS bilaterally otherwise clear to auscultation bilaterally and no dullness to percussion bilaterally Cardio: regular rate and rhythm. No gallop. GI: abdomen obese, soft, nontender, not distended, cannot appreciate mass or organomegaly. Normally active bowel sounds. Surgical incision not remarkable. Musculoskeletal/ Extremities: bilateral LE without pitting edema, cords, tenderness Neuro: minimal peripheral neuropathy fingertips. Otherwise nonfocal on exam in [facility].  PSYCH appropriate mood and affect Skin without rash, ecchymosis, petechiae Portacath-without erythema or tenderness. Accessed from CT, may not have correct disc at needle per [facility] RN.  Lab Results:  Results for orders placed or performed in visit on 08/16/15  CBC with Differential  Result Value Ref Range   WBC 14.0 (H) 3.9 - 10.3 10e3/uL   NEUT# 10.7 (H) 1.5 - 6.5 10e3/uL   HGB 8.2 (L) 11.6 - 15.9 g/dL   HCT 25.2 (L) 34.8 - 46.6 %   Platelets 141 (L) 145 - 400 10e3/uL   MCV 101.2 (H) 79.5 - 101.0 fL   MCH 32.9 25.1 - 34.0 pg   MCHC 32.5 31.5 - 36.0 g/dL   RBC 2.49 (L) 3.70 - 5.45 10e6/uL   RDW 18.9 (H) 11.2  - 14.5 %   lymph# 2.2 0.9 - 3.3 10e3/uL   MONO# 1.1 (H) 0.1 - 0.9 10e3/uL   Eosinophils Absolute 0.0 0.0 - 0.5 10e3/uL   Basophils Absolute 0.0 0.0 - 0.1 10e3/uL   NEUT% 76.1 38.4 - 76.8 %   LYMPH% 15.8 14.0 - 49.7 %   MONO% 7.9 0.0 - 14.0 %   EOS% 0.1 0.0 - 7.0 %   BASO% 0.1 0.0 - 2.0 %  Comprehensive metabolic panel  Result Value Ref Range   Sodium 141 136 - 145 mEq/L   Potassium 4.2 3.5 - 5.1 mEq/L   Chloride 108 98 - 109 mEq/L   CO2 23 22 - 29 mEq/L   Glucose 130 70 - 140 mg/dl   BUN 20.8 7.0 - 26.0 mg/dL   Creatinine 1.2 (H) 0.6 - 1.1 mg/dL   Total Bilirubin <0.30 0.20 - 1.20 mg/dL   Alkaline Phosphatase 106 40 - 150 U/L   AST 10 5 - 34 U/L   ALT <9 0 - 55 U/L   Total Protein 6.9 6.4 - 8.3 g/dL   Albumin 3.4 (L) 3.5 - 5.0 g/dL   Calcium 9.1 8.4 - 10.4 mg/dL   Anion Gap 10 3 -  11 mEq/L   EGFR 61 (L) >90 ml/min/1.73 m2     Studies/Results:  Ct Chest W Contrast  08/23/2015  CLINICAL DATA:  Patient with history of endometrial and ovarian carcinoma status post chemotherapy. EXAM: CT CHEST, ABDOMEN, AND PELVIS WITH CONTRAST TECHNIQUE: Multidetector CT imaging of the chest, abdomen and pelvis was performed following the standard protocol during bolus administration of intravenous contrast. CONTRAST:  73m ISOVUE-300 IOPAMIDOL (ISOVUE-300) INJECTION 61% COMPARISON:  Abdomen pelvic CT 04/09/2015 FINDINGS: CT CHEST Mediastinum/Nodes: Central venous catheter tip terminates in the right atrium. Normal heart size. Small amount of soft tissue within the anterior mediastinum likely residual thymus. Small hiatal hernia. Within the peripheral aspect of the right main pulmonary artery (image 19; series 2) as well as within the right lower lobe pulmonary artery (image 30; series 2) there are bandlike filling defects identified. Lungs/Pleura: Central airways are patent. No large area of pulmonary consolidation. No pleural effusion or pneumothorax. Musculoskeletal: Thoracic spine degenerative  changes. No aggressive or acute appearing osseous lesions. CT ABDOMEN AND PELVIS Hepatobiliary: Liver is normal in size and contour. No focal lesion is identified. Gallbladder is unremarkable. No intrahepatic or extrahepatic biliary ductal dilatation. Pancreas: Unremarkable Spleen: Unremarkable Adrenals/Urinary Tract: Normal adrenal glands. Kidneys are lobular in contour. No hydronephrosis. Urinary bladder is decompressed. Stomach/Bowel: No abnormal bowel wall thickening or evidence for bowel obstruction. Vascular/Lymphatic: Normal caliber abdominal aorta. No retroperitoneal lymphadenopathy. Other: Nonspecific fat stranding within the pelvis. Within the left hemipelvis there is a 2.5 x 1.6 cm soft tissue nodule (image 100; series 2), near the vaginal cuff. Within the right hemipelvis there is a 1.8 cm soft tissue nodule (image 98; series 2). Small amount of presacral free fluid. Musculoskeletal: Diastases of the rectus abdominus musculature containing non-obstructed small bowel. Significant interval decrease in size of previously described anterior abdominal wall fluid collection with residual minimal phlegmonous change measuring 3.7 x 2.0 cm. Lumbar spine degenerative changes. No aggressive or acute appearing osseous lesions. IMPRESSION: There is suggestion of possible filling defects within the right pulmonary artery raising the possibility of pulmonary embolism. Recommend dedicated evaluation of the pulmonary arteries with pulmonary artery CT. Two soft tissue nodules within the pelvis which are nonspecific and may represent postsurgical change. Recurrent disease is not excluded. Recommend attention on follow-up. Interval decrease in size and near complete resolution of previously described anterior abdominal fluid collection. Critical Value/emergent results were called by telephone at the time of interpretation on 08/23/2015 at 9:50 am to Dr. LEvlyn Clines, who verbally acknowledged these results. Electronically  Signed   By: DLovey NewcomerM.D.   On: 08/23/2015 10:04   Ct Angio Chest Pe W/cm &/or Wo Cm  08/23/2015  CLINICAL DATA:  F/u prior scan done today to check for pe EXAM: CT ANGIOGRAPHY CHEST WITH CONTRAST TECHNIQUE: Multidetector CT imaging of the chest was performed using the standard protocol during bolus administration of intravenous contrast. Multiplanar CT image reconstructions and MIPs were obtained to evaluate the vascular anatomy. CONTRAST:  100 mL Isovue 370 IV COMPARISON:  Earlier scan of the same day FINDINGS: Vascular: Contrast administered via right IJ power port, tip near the cavoatrial junction. Right atrium and right ventricle are nondilated. RV/LV ratio less than 0.9. There is good contrast opacification of pulmonary artery. Central embolus at the bifurcation of the right pulmonary artery straddling the bifurcation into upper and lower lobe branches. More peripheral segmental emboli in the right lower lobe branch. No contralateral left pulmonary emboli are identified. Patent bilateral pulmonary veins  drain into the left atrium. Adequate contrast opacification of the thoracic aorta with no evidence of dissection, aneurysm, or stenosis. There is classic 3-vessel brachiocephalic arch anatomy without proximal stenosis. Mediastinum/Lymph Nodes: No masses or pathologically enlarged lymph nodes identified. No pericardial effusion. Lungs/Pleura: Coarse linear scarring or atelectasis in the anterior right upper lobe. Lungs otherwise clear. No pleural effusion. No pneumothorax. Upper abdomen: Moderate hiatal hernia.  Otherwise negative. Musculoskeletal: Anterior vertebral endplate spurring at multiple levels in the mid and lower thoracic spine. Sternum intact. Review of the MIP images confirms the above findings. IMPRESSION: 1. Positive for acute right pulmonary emboli. No CT evidence of right heart strain. Critical Value/emergent results were called by telephone at the time of interpretation on 08/23/2015 at  11:30 am to Dr. Evlyn Clines , who verbally acknowledged these results. 2. Hiatal hernia. Electronically Signed   By: Lucrezia Europe M.D.   On: 08/23/2015 11:34   Ct Abdomen Pelvis W Contrast  08/23/2015  CLINICAL DATA:  Patient with history of endometrial and ovarian carcinoma status post chemotherapy. EXAM: CT CHEST, ABDOMEN, AND PELVIS WITH CONTRAST TECHNIQUE: Multidetector CT imaging of the chest, abdomen and pelvis was performed following the standard protocol during bolus administration of intravenous contrast. CONTRAST:  36m ISOVUE-300 IOPAMIDOL (ISOVUE-300) INJECTION 61% COMPARISON:  Abdomen pelvic CT 04/09/2015 FINDINGS: CT CHEST Mediastinum/Nodes: Central venous catheter tip terminates in the right atrium. Normal heart size. Small amount of soft tissue within the anterior mediastinum likely residual thymus. Small hiatal hernia. Within the peripheral aspect of the right main pulmonary artery (image 19; series 2) as well as within the right lower lobe pulmonary artery (image 30; series 2) there are bandlike filling defects identified. Lungs/Pleura: Central airways are patent. No large area of pulmonary consolidation. No pleural effusion or pneumothorax. Musculoskeletal: Thoracic spine degenerative changes. No aggressive or acute appearing osseous lesions. CT ABDOMEN AND PELVIS Hepatobiliary: Liver is normal in size and contour. No focal lesion is identified. Gallbladder is unremarkable. No intrahepatic or extrahepatic biliary ductal dilatation. Pancreas: Unremarkable Spleen: Unremarkable Adrenals/Urinary Tract: Normal adrenal glands. Kidneys are lobular in contour. No hydronephrosis. Urinary bladder is decompressed. Stomach/Bowel: No abnormal bowel wall thickening or evidence for bowel obstruction. Vascular/Lymphatic: Normal caliber abdominal aorta. No retroperitoneal lymphadenopathy. Other: Nonspecific fat stranding within the pelvis. Within the left hemipelvis there is a 2.5 x 1.6 cm soft tissue nodule  (image 100; series 2), near the vaginal cuff. Within the right hemipelvis there is a 1.8 cm soft tissue nodule (image 98; series 2). Small amount of presacral free fluid. Musculoskeletal: Diastases of the rectus abdominus musculature containing non-obstructed small bowel. Significant interval decrease in size of previously described anterior abdominal wall fluid collection with residual minimal phlegmonous change measuring 3.7 x 2.0 cm. Lumbar spine degenerative changes. No aggressive or acute appearing osseous lesions. IMPRESSION: There is suggestion of possible filling defects within the right pulmonary artery raising the possibility of pulmonary embolism. Recommend dedicated evaluation of the pulmonary arteries with pulmonary artery CT. Two soft tissue nodules within the pelvis which are nonspecific and may represent postsurgical change. Recurrent disease is not excluded. Recommend attention on follow-up. Interval decrease in size and near complete resolution of previously described anterior abdominal fluid collection. Critical Value/emergent results were called by telephone at the time of interpretation on 08/23/2015 at 9:50 am to Dr. LEvlyn Clines, who verbally acknowledged these results. Electronically Signed   By: DLovey NewcomerM.D.   On: 08/23/2015 10:04    Medications: I have reviewed the patient's  current medications.  DISCUSSION Reviewed CT findings of right sided PE, including PACs images. This appears acute, tho exact timing not clear from her history. I have recommended that she be observed inpatient as she begins anticoagulation now, and have spoken with hospitalist service directly. Telemetry bed requested and available. Suggest IV heparin vs full dose lovenox / arixtra now and continue LMW heparin at DC. I expect lovenox will need to be dosed bid due to morbid obesity.  Discussed IV contrast x 2 given with scans today, with initial renal function ok today but history of ARF in 02-2015 so that  IV hydration and repeat chemistries appropriate.  She used prophylactic lovenox after gyn onc surgery, patient and family members able to do injections.   She has not had CBC or any coags done today.  Note PAC may need to be reaccessed with correct disc at needle.   Assessment/Plan:  1.acute right pulmonary emboli found unexpectedly on restaging scans today,  in patient with ovarian carcinosarcoma diagnosed 02-2015, surgery 03-23-15, adjuvant chemotherapy thru 08-02-15, morbid obesity, arthritis left knee, sedentary. Admit to telemetry at least 24 hrs or longer if needed as anticoagulation begun. Hemodynamically stable. Appreciate collaboration with hospitalist physician. 2. 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 Pathology reviewed at [facility]. Taxol carboplatin begun 04-12-15, cycle 6 given 08-02-15. Course has been complicated by severe taxol aches and cytopenias despite gCSF, which have limited dosing, and significantly increased peripheral neuropathy with cycle 6. Restaging CT today shows small areas in pelvis that may be post surgical; to see Dr Denman George on 08-31-15 and I will update her on situation. Genetics counseling planned 08-28-15.   Plan to refer for GOG 0225 if NED at completion of therapy.  3.synchronous IA grade 1 endometrioid adenocarcinoma of uterus, treated surgically 4.   acute renal failure with creatinine 12.4 at presentation with pyelonephritis, resolved. Needs follow up renal function labs given IV contrast x2 today (dose for first scans was reduced) 5.Chemo neutropenia during treatment despite neulasta by on pro, fortunately no neutropenic infections. Chemo thrombocytopenia despite dose delays and adjustment in carboplatin, no bleeding. Multifactorial anemia including chemo anemia. Needs CBC diff in hospital today 6.morbid obesity, BMI 55.8. This  obviously impacts tolerance for the anemia and increases risk for the gyn malignancy. Patient motivated to address this.7.Marland Kitchenchemo peripheral neuropathy related to taxol: resume gabapentin up to tid.  7.benign positional vertigo resolved 8.perforated diverticular disease with temporary colostomy 2008.  9 chemo nausea and vomiting improved with addition of EMEND to cycle 6 10. iron deficiency anemia: One dose feraheme 06-14-15, oral iron DCd. f/u colonoscopy information. Chemo anemia 11.history of PCN allergy, which was not an anaphylactic reaction.  12.financial concerns: [facility]  13.increased GERD: better on protonix 14.PAC flipped 05-10-15, manipulated back into correct position, follow up dye study fine. Peripheral IV access very difficult. Will keep PAC for now, use also for blood draws. 14.degenerative arthritis symptomatic in knees: better since steroid injection by Dr Mingo Amber on 08-21-15   I will follow with you. Please page me if needed [place], MD   08/23/2015, 12:39 PM

## 2015-08-23 NOTE — Telephone Encounter (Signed)
Medical Oncology  Called by radiologist reading CT CAP from this AM, question of right sided pulmonary emboli, tho study was not CT angio chest and not optimal for this evaluation.  MD spoke directly with [facility] radiology, patient no longer there.  MD spoke directly with [facility], order placed for CT angio chest, informed that patient can go directly to [facility] radiology for this scan.  MD reached patient by phone, explained concern for possible PEs on earlier scan today. She will return to [facility] radiology now for CT angio chest and will wait at radiology for information after the scan.   MD let [facility] radiology know that patient is aware and is returning to their department.  L.Livesay,MD

## 2015-08-24 DIAGNOSIS — G620 Drug-induced polyneuropathy: Secondary | ICD-10-CM | POA: Diagnosis not present

## 2015-08-24 DIAGNOSIS — D6481 Anemia due to antineoplastic chemotherapy: Secondary | ICD-10-CM | POA: Diagnosis not present

## 2015-08-24 DIAGNOSIS — C541 Malignant neoplasm of endometrium: Secondary | ICD-10-CM | POA: Diagnosis not present

## 2015-08-24 DIAGNOSIS — D509 Iron deficiency anemia, unspecified: Secondary | ICD-10-CM

## 2015-08-24 DIAGNOSIS — C562 Malignant neoplasm of left ovary: Secondary | ICD-10-CM | POA: Diagnosis not present

## 2015-08-24 DIAGNOSIS — D701 Agranulocytosis secondary to cancer chemotherapy: Secondary | ICD-10-CM

## 2015-08-24 DIAGNOSIS — D6959 Other secondary thrombocytopenia: Secondary | ICD-10-CM

## 2015-08-24 DIAGNOSIS — C561 Malignant neoplasm of right ovary: Secondary | ICD-10-CM | POA: Diagnosis not present

## 2015-08-24 DIAGNOSIS — T451X5A Adverse effect of antineoplastic and immunosuppressive drugs, initial encounter: Secondary | ICD-10-CM | POA: Diagnosis not present

## 2015-08-24 DIAGNOSIS — I2699 Other pulmonary embolism without acute cor pulmonale: Secondary | ICD-10-CM | POA: Diagnosis not present

## 2015-08-24 DIAGNOSIS — I82401 Acute embolism and thrombosis of unspecified deep veins of right lower extremity: Secondary | ICD-10-CM

## 2015-08-24 DIAGNOSIS — K219 Gastro-esophageal reflux disease without esophagitis: Secondary | ICD-10-CM

## 2015-08-24 DIAGNOSIS — G622 Polyneuropathy due to other toxic agents: Secondary | ICD-10-CM

## 2015-08-24 LAB — COMPREHENSIVE METABOLIC PANEL
ALBUMIN: 3.5 g/dL (ref 3.5–5.0)
ALK PHOS: 78 U/L (ref 38–126)
ALT: 8 U/L — AB (ref 14–54)
AST: 13 U/L — AB (ref 15–41)
Anion gap: 6 (ref 5–15)
BILIRUBIN TOTAL: 0.3 mg/dL (ref 0.3–1.2)
BUN: 27 mg/dL — AB (ref 6–20)
CALCIUM: 9.2 mg/dL (ref 8.9–10.3)
CO2: 24 mmol/L (ref 22–32)
CREATININE: 1.08 mg/dL — AB (ref 0.44–1.00)
Chloride: 108 mmol/L (ref 101–111)
GFR calc Af Amer: >60 mL/min (ref >60)
GFR, EST NON AFRICAN AMERICAN: 59 mL/min — AB (ref >60)
GLUCOSE: 110 mg/dL — AB (ref 65–99)
POTASSIUM: 4.5 mmol/L (ref 3.5–5.1)
Sodium: 138 mmol/L (ref 135–145)
TOTAL PROTEIN: 6.3 g/dL — AB (ref 6.5–8.1)

## 2015-08-24 LAB — CBC
HEMATOCRIT: 22.5 % — AB (ref 36.0–46.0)
HEMOGLOBIN: 7.4 g/dL — AB (ref 12.0–15.0)
MCH: 34.1 pg — ABNORMAL HIGH (ref 26.0–34.0)
MCHC: 32.9 g/dL (ref 30.0–36.0)
MCV: 103.7 fL — AB (ref 78.0–100.0)
Platelets: 122 10*3/uL — ABNORMAL LOW (ref 150–400)
RBC: 2.17 MIL/uL — ABNORMAL LOW (ref 3.87–5.11)
RDW: 18 % — AB (ref 11.5–15.5)
WBC: 9.1 10*3/uL (ref 4.0–10.5)

## 2015-08-24 LAB — PREPARE RBC (CROSSMATCH)

## 2015-08-24 MED ORDER — GABAPENTIN 300 MG PO CAPS: 300.0000 mg | ORAL_CAPSULE | Freq: Every day | ORAL | Status: DC

## 2015-08-24 MED ORDER — FERROUS FUMARATE 324 (106 FE) MG PO TABS
1.0000 | ORAL_TABLET | Freq: Every day | ORAL | Status: DC
  Administered 2015-08-24: 106 mg via ORAL
  Filled 2015-08-24: qty 1

## 2015-08-24 MED ORDER — SODIUM CHLORIDE 0.9 % IV SOLN: Freq: Once | INTRAVENOUS | Status: DC

## 2015-08-24 MED ORDER — FUROSEMIDE 10 MG/ML IJ SOLN
20.0000 mg | Freq: Once | INTRAMUSCULAR | Status: AC
  Administered 2015-08-24: 20 mg via INTRAVENOUS
  Filled 2015-08-24: qty 2

## 2015-08-24 MED ORDER — ENOXAPARIN SODIUM 150 MG/ML ~~LOC~~ SOLN: SUBCUTANEOUS | Status: DC

## 2015-08-24 MED FILL — GABAPENTIN 300 MG CAPSULE: 300 | 30 days supply | Qty: 30 | Fill #0

## 2015-08-24 NOTE — Discharge Summary (Signed)
Physician Discharge Summary  [name] [id number] DOB: [birth year] DOA: 08/23/2015  PCP: Annabell Sabal, MD  Admit date: 08/23/2015 Discharge date: 08/24/2015  Time spent: 45 minutes  Recommendations for Outpatient Follow-up:  1. Dr.Livesay in 1 week  Discharge Diagnoses:    Acute PE and DVT   Knee DJD/arthritis   Peripheral neuropathy due to chemotherapy   Right knee pain   Essential hypertension, benign   Morbid obesity with BMI of 60.0-69.9, adult ([place])   Ovarian carcinosarcoma ([place])   Anemia due to antineoplastic chemotherapy   Pulmonary embolism ([place])   Discharge Condition: stable  Diet recommendation: heart healthy  Filed Weights   08/23/15 1300  Weight: 156.264 kg (344 lb 8 oz)    History of present illness:  [name] is a 50 y.o. female with PMH of Stage 2 Ovarian Ca, Morbid Obesity, Arthritis/DJD of knees R>L, was in radiology for restaging CT scans and was incidentally found to have suspected PE, subsequently had a CTA chest which showed R sided PE and was directly admitted at the request of Dr.Livesay. Pt reports feeling ok, except for her chronic knee aches/pains and neuropathy in her legs. She denies dyspnea, chest pain etc. Her mobility is limited due to neuropathy and knee pains, she just had cortisone injected in her R knee on Tuesday  Hospital Course:  Pulmonary embolism - hemodynamically stable, remarkably asymptomatic - Started on full dose Lovenox, Lovenox teaching completed - lower extremity Dopplers positive for DVT in right common femoral vein, femoral vein, and popliteal vein - continue lovenox indefinitely for now, duration per Dr.Livesay  Anemia  -Secondary to chronic disease, iron deficiency and chemotherapy  -given 1 unit PRBC prior to discharge today  Neuropathy secondary to chemotherapy -Continue gabapentin, changed to QHS instead of TID due to symptoms -Physical therapy evaluation completed  DJD/morbid obesity Status  post recent cortisone injection the right knee  Stage II ovarian Ca -Status post debulking surgery followed by chemotherapy -FU with Dr.Livesay  Consultations:  Dr.Livesay  Discharge Exam: Filed Vitals:   08/24/15 1254 08/24/15 1500  BP: 133/63 135/67  Pulse: 83 80  Temp: 97.7 F (36.5 C) 97.8 F (36.6 C)  Resp: 18 18    General:AAOx3 Cardiovascular: S1S2/RRR Respiratory: CTAB  Discharge Instructions   Discharge Instructions    Diet - low sodium heart healthy    Complete by:  As directed      Diet - low sodium heart healthy    Complete by:  As directed      Increase activity slowly    Complete by:  As directed      Increase activity slowly    Complete by:  As directed           Current Discharge Medication List    START taking these medications   Details  enoxaparin (LOVENOX) 150 MG/ML injection Dose: 150mg  Subcutaneously every 12hours. Please dispense 1 month supply with 1 refill Qty: 60 Syringe, Refills: 1      CONTINUE these medications which have CHANGED   Details  gabapentin (NEURONTIN) 300 MG capsule Take 1 capsule (300 mg total) by mouth at bedtime. Qty: 30 capsule, Refills: 0   Associated Diagnoses: Chemotherapy-induced peripheral neuropathy ([place])      CONTINUE these medications which have NOT CHANGED   Details  acetaminophen (TYLENOL) 500 MG tablet Take 1,000 mg by mouth every 6 (six) hours as needed for mild pain, moderate pain, fever or headache.    clobetasol cream (TEMOVATE) 0.05 % Apply  topically 2 (two) times daily. Qty: 30 g, Refills: 0    docusate sodium (COLACE) 100 MG capsule Take 100 mg by mouth daily as needed for mild constipation. Reported on 08/23/2015    lidocaine-prilocaine (EMLA) cream Apply to PAC site 1-2 hours prior to access as needed. Qty: 30 g, Refills: 2   Associated Diagnoses: Endometrial ca ([place]); Ovarian carcinosarcoma, right ([facility])    loratadine (CLARITIN) 10 MG tablet Take 10 mg by mouth daily as needed for  allergies. Reported on 08/23/2015   Associated Diagnoses: Ovarian carcinosarcoma, right ([place]); Ovarian carcinoma, unspecified laterality ([facility])    LORazepam (ATIVAN) 1 MG tablet Place 1/2 to 1 tablet under the tongue or swallow every 6 hours as needed for nausea. Will make drowsy. Qty: 20 tablet, Refills: 0   Associated Diagnoses: Endometrial ca ([place]); Ovarian carcinosarcoma, right ([facility])    meclizine (ANTIVERT) 25 MG tablet Take 1 tablet (25 mg total) by mouth 3 (three) times daily as needed for dizziness. Qty: 30 tablet, Refills: 0    oxyCODONE (OXY IR/ROXICODONE) 5 MG immediate release tablet Take 1-2 tablets (5-10 mg total) by mouth every 4 (four) hours as needed for severe pain. Qty: 30 tablet, Refills: 0   Associated Diagnoses: Ovarian carcinoma, unspecified laterality ([place]); Ovarian carcinosarcoma, right ([facility])    pantoprazole (PROTONIX) 40 MG tablet Take 1 tablet (40 mg total) by mouth daily. Qty: 30 tablet, Refills: 2   Associated Diagnoses: Ovarian cancer on right ([facility])    polyethylene glycol (MIRALAX / GLYCOLAX) packet Take 17 g by mouth 2 (two) times daily as needed for mild constipation. Reported on 08/23/2015   Associated Diagnoses: Ovarian carcinosarcoma, right ([place]); Ovarian carcinoma, unspecified laterality ([facility])       Allergies  Allergen Reactions  . Penicillins Rash    Has patient had a PCN reaction causing immediate rash, facial/tongue/throat swelling, SOB or lightheadedness with hypotension: no Has patient had a PCN reaction causing severe rash involving mucus membranes or skin necrosis: no Has patient had a PCN reaction that required hospitalization in the hospital at the time Has patient had a PCN reaction occurring within the last 10 years: no If all of the above answers are "NO", then may proceed with Cephalosporin use.    Follow-up Information    Follow up with LIVESAY,LENNIS P, MD. Schedule an appointment as soon as possible for a visit in 1 week.   Specialty:   Oncology   Contact information:   [address] [place] [address] [phone]        The results of significant diagnostics from this hospitalization (including imaging, microbiology, ancillary and laboratory) are listed below for reference.    Significant Diagnostic Studies: Ct Chest W Contrast  08/23/2015  CLINICAL DATA:  Patient with history of endometrial and ovarian carcinoma status post chemotherapy. EXAM: CT CHEST, ABDOMEN, AND PELVIS WITH CONTRAST TECHNIQUE: Multidetector CT imaging of the chest, abdomen and pelvis was performed following the standard protocol during bolus administration of intravenous contrast. CONTRAST:  32mL ISOVUE-300 IOPAMIDOL (ISOVUE-300) INJECTION 61% COMPARISON:  Abdomen pelvic CT 04/09/2015 FINDINGS: CT CHEST Mediastinum/Nodes: Central venous catheter tip terminates in the right atrium. Normal heart size. Small amount of soft tissue within the anterior mediastinum likely residual thymus. Small hiatal hernia. Within the peripheral aspect of the right main pulmonary artery (image 19; series 2) as well as within the right lower lobe pulmonary artery (image 30; series 2) there are bandlike filling defects identified. Lungs/Pleura: Central airways are patent. No large area of  pulmonary consolidation. No pleural effusion or pneumothorax. Musculoskeletal: Thoracic spine degenerative changes. No aggressive or acute appearing osseous lesions. CT ABDOMEN AND PELVIS Hepatobiliary: Liver is normal in size and contour. No focal lesion is identified. Gallbladder is unremarkable. No intrahepatic or extrahepatic biliary ductal dilatation. Pancreas: Unremarkable Spleen: Unremarkable Adrenals/Urinary Tract: Normal adrenal glands. Kidneys are lobular in contour. No hydronephrosis. Urinary bladder is decompressed. Stomach/Bowel: No abnormal bowel wall thickening or evidence for bowel obstruction. Vascular/Lymphatic: Normal caliber abdominal aorta. No retroperitoneal  lymphadenopathy. Other: Nonspecific fat stranding within the pelvis. Within the left hemipelvis there is a 2.5 x 1.6 cm soft tissue nodule (image 100; series 2), near the vaginal cuff. Within the right hemipelvis there is a 1.8 cm soft tissue nodule (image 98; series 2). Small amount of presacral free fluid. Musculoskeletal: Diastases of the rectus abdominus musculature containing non-obstructed small bowel. Significant interval decrease in size of previously described anterior abdominal wall fluid collection with residual minimal phlegmonous change measuring 3.7 x 2.0 cm. Lumbar spine degenerative changes. No aggressive or acute appearing osseous lesions. IMPRESSION: There is suggestion of possible filling defects within the right pulmonary artery raising the possibility of pulmonary embolism. Recommend dedicated evaluation of the pulmonary arteries with pulmonary artery CT. Two soft tissue nodules within the pelvis which are nonspecific and may represent postsurgical change. Recurrent disease is not excluded. Recommend attention on follow-up. Interval decrease in size and near complete resolution of previously described anterior abdominal fluid collection. Critical Value/emergent results were called by telephone at the time of interpretation on 08/23/2015 at 9:50 am to Dr. Evlyn Clines , who verbally acknowledged these results. Electronically Signed   By: Lovey Newcomer M.D.   On: 08/23/2015 10:04   Ct Angio Chest Pe W/cm &/or Wo Cm  08/23/2015  CLINICAL DATA:  F/u prior scan done today to check for pe EXAM: CT ANGIOGRAPHY CHEST WITH CONTRAST TECHNIQUE: Multidetector CT imaging of the chest was performed using the standard protocol during bolus administration of intravenous contrast. Multiplanar CT image reconstructions and MIPs were obtained to evaluate the vascular anatomy. CONTRAST:  100 mL Isovue 370 IV COMPARISON:  Earlier scan of the same day FINDINGS: Vascular: Contrast administered via right IJ power  port, tip near the cavoatrial junction. Right atrium and right ventricle are nondilated. RV/LV ratio less than 0.9. There is good contrast opacification of pulmonary artery. Central embolus at the bifurcation of the right pulmonary artery straddling the bifurcation into upper and lower lobe branches. More peripheral segmental emboli in the right lower lobe branch. No contralateral left pulmonary emboli are identified. Patent bilateral pulmonary veins drain into the left atrium. Adequate contrast opacification of the thoracic aorta with no evidence of dissection, aneurysm, or stenosis. There is classic 3-vessel brachiocephalic arch anatomy without proximal stenosis. Mediastinum/Lymph Nodes: No masses or pathologically enlarged lymph nodes identified. No pericardial effusion. Lungs/Pleura: Coarse linear scarring or atelectasis in the anterior right upper lobe. Lungs otherwise clear. No pleural effusion. No pneumothorax. Upper abdomen: Moderate hiatal hernia.  Otherwise negative. Musculoskeletal: Anterior vertebral endplate spurring at multiple levels in the mid and lower thoracic spine. Sternum intact. Review of the MIP images confirms the above findings. IMPRESSION: 1. Positive for acute right pulmonary emboli. No CT evidence of right heart strain. Critical Value/emergent results were called by telephone at the time of interpretation on 08/23/2015 at 11:30 am to Dr. Evlyn Clines , who verbally acknowledged these results. 2. Hiatal hernia. Electronically Signed   By: Lucrezia Europe M.D.   On: 08/23/2015  11:34   Ct Abdomen Pelvis W Contrast  08/23/2015  CLINICAL DATA:  Patient with history of endometrial and ovarian carcinoma status post chemotherapy. EXAM: CT CHEST, ABDOMEN, AND PELVIS WITH CONTRAST TECHNIQUE: Multidetector CT imaging of the chest, abdomen and pelvis was performed following the standard protocol during bolus administration of intravenous contrast. CONTRAST:  46mL ISOVUE-300 IOPAMIDOL (ISOVUE-300)  INJECTION 61% COMPARISON:  Abdomen pelvic CT 04/09/2015 FINDINGS: CT CHEST Mediastinum/Nodes: Central venous catheter tip terminates in the right atrium. Normal heart size. Small amount of soft tissue within the anterior mediastinum likely residual thymus. Small hiatal hernia. Within the peripheral aspect of the right main pulmonary artery (image 19; series 2) as well as within the right lower lobe pulmonary artery (image 30; series 2) there are bandlike filling defects identified. Lungs/Pleura: Central airways are patent. No large area of pulmonary consolidation. No pleural effusion or pneumothorax. Musculoskeletal: Thoracic spine degenerative changes. No aggressive or acute appearing osseous lesions. CT ABDOMEN AND PELVIS Hepatobiliary: Liver is normal in size and contour. No focal lesion is identified. Gallbladder is unremarkable. No intrahepatic or extrahepatic biliary ductal dilatation. Pancreas: Unremarkable Spleen: Unremarkable Adrenals/Urinary Tract: Normal adrenal glands. Kidneys are lobular in contour. No hydronephrosis. Urinary bladder is decompressed. Stomach/Bowel: No abnormal bowel wall thickening or evidence for bowel obstruction. Vascular/Lymphatic: Normal caliber abdominal aorta. No retroperitoneal lymphadenopathy. Other: Nonspecific fat stranding within the pelvis. Within the left hemipelvis there is a 2.5 x 1.6 cm soft tissue nodule (image 100; series 2), near the vaginal cuff. Within the right hemipelvis there is a 1.8 cm soft tissue nodule (image 98; series 2). Small amount of presacral free fluid. Musculoskeletal: Diastases of the rectus abdominus musculature containing non-obstructed small bowel. Significant interval decrease in size of previously described anterior abdominal wall fluid collection with residual minimal phlegmonous change measuring 3.7 x 2.0 cm. Lumbar spine degenerative changes. No aggressive or acute appearing osseous lesions. IMPRESSION: There is suggestion of possible  filling defects within the right pulmonary artery raising the possibility of pulmonary embolism. Recommend dedicated evaluation of the pulmonary arteries with pulmonary artery CT. Two soft tissue nodules within the pelvis which are nonspecific and may represent postsurgical change. Recurrent disease is not excluded. Recommend attention on follow-up. Interval decrease in size and near complete resolution of previously described anterior abdominal fluid collection. Critical Value/emergent results were called by telephone at the time of interpretation on 08/23/2015 at 9:50 am to Dr. Evlyn Clines , who verbally acknowledged these results. Electronically Signed   By: Lovey Newcomer M.D.   On: 08/23/2015 10:04    Microbiology: No results found for this or any previous visit (from the past 240 hour(s)).   Labs: Basic Metabolic Panel:  Recent Labs Lab 08/23/15 1449 08/24/15 0125  NA 139 138  K 4.5 4.5  CL 107 108  CO2 24 24  GLUCOSE 95 110*  BUN 26* 27*  CREATININE 1.05* 1.08*  CALCIUM 9.5 9.2   Liver Function Tests:  Recent Labs Lab 08/23/15 1449 08/24/15 0125  AST 12* 13*  ALT 9* 8*  ALKPHOS 81 78  BILITOT 0.6 0.3  PROT 6.8 6.3*  ALBUMIN 3.7 3.5   No results for input(s): LIPASE, AMYLASE in the last 168 hours. No results for input(s): AMMONIA in the last 168 hours. CBC:  Recent Labs Lab 08/23/15 1449 08/24/15 0125  WBC 8.6 9.1  HGB 7.9* 7.4*  HCT 23.4* 22.5*  MCV 102.6* 103.7*  PLT 124* 122*   Cardiac Enzymes: No results for input(s): CKTOTAL, CKMB, CKMBINDEX,  TROPONINI in the last 168 hours. BNP: BNP (last 3 results) No results for input(s): BNP in the last 8760 hours.  ProBNP (last 3 results) No results for input(s): PROBNP in the last 8760 hours.  CBG: No results for input(s): GLUCAP in the last 168 hours.     SignedDomenic Polite MD.  [facility] 08/24/2015, 3:04 PM

## 2015-08-24 NOTE — Plan of Care (Signed)
Problem: Food- and Nutrition-Related Knowledge Deficit (NB-1.1) Goal: Nutrition education Formal process to instruct or train a patient/client in a skill or to impart knowledge to help patients/clients voluntarily manage or modify food choices and eating behavior to maintain or improve health. Outcome: Completed/Met Date Met:  08/24/15  RD consulted for nutrition education regarding weight loss.  Body mass index is 56.44 kg/(m^2). Pt meets criteria for obese class III based on current BMI.  RD provided "Weight Loss Tips" handout from the Academy of [employer]. Emphasized the importance of serving sizes and provided examples of correct portions of common foods. Discussed importance of controlled and consistent intake throughout the day. Provided examples of ways to balance meals/snacks and encouraged intake of high-fiber, whole grain complex carbohydrates. Emphasized the importance of hydration with calorie-free beverages and limiting sugar-sweetened beverages. Encouraged pt to discuss physical activity options with physician. Teach back method used.  Expect fair compliance.  Current diet order is low sodium/HH, patient is consuming approximately 100% of meals at this time. Labs and medications reviewed. No further nutrition interventions warranted at this time. RD contact information provided. If additional nutrition issues arise, please re-consult RD.  William M. Ward, MS, RD LDN Inpatient Clinical Dietitian Pager [phone]          

## 2015-08-24 NOTE — Progress Notes (Signed)
Gave pt teaching on Lovenox injection and precautions and side effects. Pt able to successfully administer Lovenox injection.

## 2015-08-24 NOTE — Progress Notes (Signed)
Co pay for Lovenox $1,019.66, generic zero co pay. Information given to MD.

## 2015-08-24 NOTE — Progress Notes (Signed)
MEDICAL ONCOLOGY Aug 24, 2015, 8:03 AM  Hospital day 2 Antibiotics:  none Chemotherapy: last planned adjuvant carboplatin taxol given 08-02-15 (cycle 6)  Outpatient Physicians: Everitt Amber, Alveda Reasons, MD (PCP),L.Livesay     EMR reviewed. Patient seen, no family here presently  My apologies to hospitalist team, as my office note 08-22-17 was in EPIC by time of admission but not available in Notes until IT assisted last pm.  Pharmacist note re lovenox dosing reviewed, needs to remain on bid due to weight.  CASE MANAGER: please be sure insurance covers lovenox and her pharmacy has it available prior to DC.  Subjective: Right knee aching and LE peripheral neuropathy made it difficult for her to sleep last pm. Right knee known degenerative arthritis for which she had steroid injection this week, but likely also related to DVT (DVT reportedly seen on LE dopplers on admission, that report still pending in EMR); bilateral neuropathy feet and legs is related to taxol chemotherapy, hopefully will improve as she gets further out from chemo. She took oxycodone x 1 without much improvement, and usual gabapentin; she dislikes using pain medication. She denies chest pain or increased SOB over symptoms of last several weeks. She has been up to BR without difficulty. No bleeding. No nausea is eating and drinking fluids. No problems with PAC, given IVF overnight due to concerns for renal status post IV contrast x 2 on 08-23-15 - as creatinine fine this AM I have DCd the IVF now.   She gave her own prophylactic lovenox injections after gyn onc surgery, and is in agreement with giving these full dose bid now.   CA 125 on 02-09-15 was 21.8 PAC by IR 04-11-15 Flu vaccine 12-12-14 Feraheme x1 on 06-14-15, not planning second dose but now off oral iron. Genetics counseling scheduled for 08-28-15   ONCOLOGIC HISTORY Patient had intermittent right lower quadrant abdominal pain since Aug 2016, with pelvic US  11-30-14 with uterine fibroids (11.2 x 4.8 x 6.8 cm. 1.7 x 1.7 x 2.0 cm subserosal fibroid in the right uterine body. 6.3 x 3.6 x 5.7 cm probable exophytic/pedunculated fibroid along the anterior uterine fundus), endometrium poorly visualized, right ovary poorly visualized but measured 4 x 2.8 x 4.4 cm and left ovary 4.9 x 3.4 x 4.9 cm with probable corpus luteal cyst called. Note patient is morbidly obese, with BMI >60. She had some fevers during that time. Patient was admitted to [facility] 02-18-15 thru 02-21-15 with pyelonephritis and acute renal failure (creatinine 12.4), both of those problems resolving entirely with treatment. CT AP and MRI pelvis done to evaluate the pyelonephritis demonstrated cystic and solid pelvic mass appearing to be arising from the right adnexa 13 x 9 x 17.5 cm, with a second cystic and solid lesion within the left adnexa measuring 4.4 x 6.5 x 5.3 cm. A third cystic and solid mass was seen anterior to the uterus and superior to the bladder measuring 4.2 x 5.4 x 5.3 cm. She had no pelvic lymphadenopathy, no carcinomatosis, and CA 125 was normal at 21 U/mL. She was seen in consultation by Dr Denman George, with exploratory laparotomy 03-23-15 with TAH BSO, partial small bowel resection and radical debulking, with additional ventral hernia repair. She was hospitalized with the surgery from 12-6 thru 03-16-15, discharged on prophylactic lovenox which she continues, no post op complications in hospital. Jodell Cipro were removed on 03-27-15, some separation of wound with wound care continuing by patient at home. 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 She saw Dr Denman George 03-29-15, with recommendation for adjuvant chemotherapy and follow up with gyn oncology expected after 6 cycles of chemotherapy. Cycle 1 carbo taxol  given 04-12-15, carbo capped at 750 mg and taxol dosed at 175 mg/m2 using actual weight. She was neutropenic with ANCX 0.1 on day 12 cycle 1. Doses same for cycle 2, granix begun day 6, ANC 0.2 on day 8. On pro neulasta used cycle 4, after dose delay and carbo decreased for thrombocytopenia. EMEND was helpful with nausea with cycle 6 on 08-02-15.     Objective: Vital signs in last 24 hours: Blood pressure 124/56, pulse 86, temperature 98.1 F (36.7 C), temperature source Oral, resp. rate 18, height 5' 5.5" (1.664 m), weight 344 lb 8 oz (156.264 kg), last menstrual period 10/16/2013, SpO2 100 %.  No notes re any concerns from telemetry. Intake/Output from previous day: 05/18 0701 - 05/19 0700 In: 883.3 [P.O.:360; I.V.:523.3] Out: 3 [Urine:3] Intake/Output this shift:    Physical exam: awake, alert, looks comfortable in bed, respirations not labored RA. Complete alopecia. PERRL, not icteric. Oral mucosa moist and clear. Heart RRR. PAC site ok, accessed and infusing without difficulty. Lungs slightly diminished BS thruout not unexpected with size, no rubs/ wheezes/ crackles. Abdomen obese, soft, + BS, nontender. LE obese, no pitting edema or palpable cords. Slightly tender at lateral patella on right; per patient, DVT found medial behind knee. Feet warm. Moves all extremities easily. Speech fluent and appropriate, no focal neuro deficits other than LE peripheral neuropathy.   Lab Results:  Recent Labs  08/23/15 1449 08/24/15 0125  WBC 8.6 9.1  HGB 7.9* 7.4*  HCT 23.4* 22.5*  PLT 124* 122*   Type and screen sent, however patient does not appear symptomatic enough to require transfusion this AM.  Last iron studies low normal 2 months ago, had been iron deficient on initial presentation with the gyn malignancy  BMET  Recent Labs  08/23/15 1449 08/24/15 0125  NA 139 138  K 4.5 4.5  CL 107 108  CO2 24 24  GLUCOSE 95 110*  BUN 26* 27*  CREATININE 1.05* 1.08*  CALCIUM 9.5 9.2     Studies/Results: Ct Chest W Contrast  08/23/2015  CLINICAL DATA:  Patient with history of endometrial and ovarian carcinoma status post chemotherapy. EXAM: CT CHEST, ABDOMEN, AND PELVIS WITH CONTRAST TECHNIQUE: Multidetector CT imaging of the chest, abdomen and pelvis was performed following the standard protocol during bolus administration of intravenous contrast. CONTRAST:  30mL ISOVUE-300 IOPAMIDOL (ISOVUE-300) INJECTION 61% COMPARISON:  Abdomen pelvic CT 04/09/2015 FINDINGS: CT CHEST Mediastinum/Nodes: Central venous catheter tip terminates in the right atrium. Normal heart size. Small amount of soft tissue within the anterior mediastinum likely residual thymus. Small hiatal hernia. Within the peripheral aspect of the right main pulmonary artery (image 19; series 2) as well as within the right lower lobe pulmonary artery (image 30; series 2) there are bandlike filling defects identified. Lungs/Pleura: Central airways are patent. No large area of pulmonary consolidation. No pleural effusion or pneumothorax. Musculoskeletal: Thoracic spine degenerative changes. No aggressive or acute appearing osseous lesions. CT ABDOMEN AND PELVIS Hepatobiliary: Liver is normal in size and contour. No focal lesion is identified. Gallbladder is unremarkable. No intrahepatic or extrahepatic biliary ductal dilatation. Pancreas: Unremarkable Spleen: Unremarkable Adrenals/Urinary Tract: Normal adrenal glands. Kidneys are lobular in contour. No hydronephrosis. Urinary  bladder is decompressed. Stomach/Bowel: No abnormal bowel wall thickening or evidence for bowel obstruction. Vascular/Lymphatic: Normal caliber abdominal aorta. No retroperitoneal lymphadenopathy. Other: Nonspecific fat stranding within the pelvis. Within the left hemipelvis there is a 2.5 x 1.6 cm soft tissue nodule (image 100; series 2), near the vaginal cuff. Within the right hemipelvis there is a 1.8 cm soft tissue nodule (image 98; series 2). Small amount of  presacral free fluid. Musculoskeletal: Diastases of the rectus abdominus musculature containing non-obstructed small bowel. Significant interval decrease in size of previously described anterior abdominal wall fluid collection with residual minimal phlegmonous change measuring 3.7 x 2.0 cm. Lumbar spine degenerative changes. No aggressive or acute appearing osseous lesions. IMPRESSION: There is suggestion of possible filling defects within the right pulmonary artery raising the possibility of pulmonary embolism. Recommend dedicated evaluation of the pulmonary arteries with pulmonary artery CT. Two soft tissue nodules within the pelvis which are nonspecific and may represent postsurgical change. Recurrent disease is not excluded. Recommend attention on follow-up. Interval decrease in size and near complete resolution of previously described anterior abdominal fluid collection. Critical Value/emergent results were called by telephone at the time of interpretation on 08/23/2015 at 9:50 am to Dr. Evlyn Clines , who verbally acknowledged these results. Electronically Signed   By: Lovey Newcomer M.D.   On: 08/23/2015 10:04   Ct Angio Chest Pe W/cm &/or Wo Cm  08/23/2015  CLINICAL DATA:  F/u prior scan done today to check for pe EXAM: CT ANGIOGRAPHY CHEST WITH CONTRAST TECHNIQUE: Multidetector CT imaging of the chest was performed using the standard protocol during bolus administration of intravenous contrast. Multiplanar CT image reconstructions and MIPs were obtained to evaluate the vascular anatomy. CONTRAST:  100 mL Isovue 370 IV COMPARISON:  Earlier scan of the same day FINDINGS: Vascular: Contrast administered via right IJ power port, tip near the cavoatrial junction. Right atrium and right ventricle are nondilated. RV/LV ratio less than 0.9. There is good contrast opacification of pulmonary artery. Central embolus at the bifurcation of the right pulmonary artery straddling the bifurcation into upper and lower lobe  branches. More peripheral segmental emboli in the right lower lobe branch. No contralateral left pulmonary emboli are identified. Patent bilateral pulmonary veins drain into the left atrium. Adequate contrast opacification of the thoracic aorta with no evidence of dissection, aneurysm, or stenosis. There is classic 3-vessel brachiocephalic arch anatomy without proximal stenosis. Mediastinum/Lymph Nodes: No masses or pathologically enlarged lymph nodes identified. No pericardial effusion. Lungs/Pleura: Coarse linear scarring or atelectasis in the anterior right upper lobe. Lungs otherwise clear. No pleural effusion. No pneumothorax. Upper abdomen: Moderate hiatal hernia.  Otherwise negative. Musculoskeletal: Anterior vertebral endplate spurring at multiple levels in the mid and lower thoracic spine. Sternum intact. Review of the MIP images confirms the above findings. IMPRESSION: 1. Positive for acute right pulmonary emboli. No CT evidence of right heart strain. Critical Value/emergent results were called by telephone at the time of interpretation on 08/23/2015 at 11:30 am to Dr. Evlyn Clines , who verbally acknowledged these results. 2. Hiatal hernia. Electronically Signed   By: Lucrezia Europe M.D.   On: 08/23/2015 11:34   Ct Abdomen Pelvis W Contrast  08/23/2015  CLINICAL DATA:  Patient with history of endometrial and ovarian carcinoma status post chemotherapy. EXAM: CT CHEST, ABDOMEN, AND PELVIS WITH CONTRAST TECHNIQUE: Multidetector CT imaging of the chest, abdomen and pelvis was performed following the standard protocol during bolus administration of intravenous contrast. CONTRAST:  64mL ISOVUE-300 IOPAMIDOL (ISOVUE-300) INJECTION 61%  COMPARISON:  Abdomen pelvic CT 04/09/2015 FINDINGS: CT CHEST Mediastinum/Nodes: Central venous catheter tip terminates in the right atrium. Normal heart size. Small amount of soft tissue within the anterior mediastinum likely residual thymus. Small hiatal hernia. Within the  peripheral aspect of the right main pulmonary artery (image 19; series 2) as well as within the right lower lobe pulmonary artery (image 30; series 2) there are bandlike filling defects identified. Lungs/Pleura: Central airways are patent. No large area of pulmonary consolidation. No pleural effusion or pneumothorax. Musculoskeletal: Thoracic spine degenerative changes. No aggressive or acute appearing osseous lesions. CT ABDOMEN AND PELVIS Hepatobiliary: Liver is normal in size and contour. No focal lesion is identified. Gallbladder is unremarkable. No intrahepatic or extrahepatic biliary ductal dilatation. Pancreas: Unremarkable Spleen: Unremarkable Adrenals/Urinary Tract: Normal adrenal glands. Kidneys are lobular in contour. No hydronephrosis. Urinary bladder is decompressed. Stomach/Bowel: No abnormal bowel wall thickening or evidence for bowel obstruction. Vascular/Lymphatic: Normal caliber abdominal aorta. No retroperitoneal lymphadenopathy. Other: Nonspecific fat stranding within the pelvis. Within the left hemipelvis there is a 2.5 x 1.6 cm soft tissue nodule (image 100; series 2), near the vaginal cuff. Within the right hemipelvis there is a 1.8 cm soft tissue nodule (image 98; series 2). Small amount of presacral free fluid. Musculoskeletal: Diastases of the rectus abdominus musculature containing non-obstructed small bowel. Significant interval decrease in size of previously described anterior abdominal wall fluid collection with residual minimal phlegmonous change measuring 3.7 x 2.0 cm. Lumbar spine degenerative changes. No aggressive or acute appearing osseous lesions. IMPRESSION: There is suggestion of possible filling defects within the right pulmonary artery raising the possibility of pulmonary embolism. Recommend dedicated evaluation of the pulmonary arteries with pulmonary artery CT. Two soft tissue nodules within the pelvis which are nonspecific and may represent postsurgical change. Recurrent  disease is not excluded. Recommend attention on follow-up. Interval decrease in size and near complete resolution of previously described anterior abdominal fluid collection. Critical Value/emergent results were called by telephone at the time of interpretation on 08/23/2015 at 9:50 am to Dr. Evlyn Clines , who verbally acknowledged these results. Electronically Signed   By: Lovey Newcomer M.D.   On: 08/23/2015 10:04    MEDICATIONS: she has been on oral ferrous fumarate once daily on empty stomach with OJ, tho she has not taken this regularly, reordered now. Lovenox 150 mg bid. LMW heparin medically necessary given PEs in setting of gyn malignancy.  DISCUSSION:  Interval history as above. She agrees to bid LMW heparin and oral iron. She does not feel that she needs PRBCs now, will follow hemoglobin when she is back at [facility] 5-23 and 08-31-15. CASE MANAGER PLEASE BE SURE SHE CAN OBTAIN LOVENOX   If hospitalist agrees, likely stable for DC home today  Assessment/Plan:  1. right pulmonary emboli found unexpectedly on restaging scans 08-23-15,appear acute tho may have been present for possibly a few weeks, in patient with ovarian carcinosarcoma diagnosed 02-2015, surgery 03-23-15, adjuvant chemotherapy thru 08-02-15, morbid obesity, arthritis left knee, sedentary. Stable overnight on telemetry, has begun full dose lovenox. RLE DVT reportedly identified at knee (report not finalized in EMR).  2. 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 Pathology reviewed at [facility]. Taxol carboplatin begun 04-12-15, cycle 6 given 08-02-15. Course has been complicated by severe taxol aches and cytopenias despite gCSF, which have limited dosing, and significantly increased peripheral neuropathy  with cycle 6. Restaging CT today shows small areas in pelvis that may be post surgical; to see Dr Denman George  on 08-31-15 and I will update her on situation. Genetics counseling planned 08-28-15.  Plan to refer for GOG 0225 if NED at completion of therapy.  3.synchronous IA grade 1 endometrioid adenocarcinoma of uterus, treated surgically 4. acute renal failure with creatinine 12.4 at presentation with pyelonephritis 02-2015, resolved. Renal function stable overnight with IV hydration following IV contrast with CT CAP then with CT angio chest on 08-23-15. IVF DCd now. 5.Chemo neutropenia during treatment despite neulasta by on pro, fortunately no neutropenic infections. Chemo thrombocytopenia despite dose delays and adjustment in carboplatin, no bleeding. Multifactorial anemia including chemo anemia. Hopefully will see improvement soon in anemia, tho would need PRBCs if more symptomatic. Will follow up CBC at [facility] next week.  6.morbid obesity, BMI 55.8. This obviously impacts tolerance for the anemia and increases risk for the gyn malignancy. Patient motivated to address this. I have asked hospital nutritionist to give information on healthy diet for weight loss.  7..chemo peripheral neuropathy related to taxol: much worse since cycle 6 chemo, gabapentin does help symptoms. Follow   8.perforated diverticular disease with temporary colostomy 2008.  9 chemo nausea and vomiting improved with addition of EMEND to cycle 6 10. iron deficiency anemia: One dose feraheme 06-14-15, oral iron DCd. f/u colonoscopy information. Chemo anemia 11.history of PCN allergy, which was not an anaphylactic reaction.  12.financial concerns: appreciate assistance from [facility]  13.increased GERD: better on protonix 14.PAC flipped 05-10-15, manipulated back into correct position, follow up dye study fine. Peripheral IV access very difficult. Will keep PAC for now, use also for blood draws. 14.degenerative arthritis symptomatic in knees:  steroid injection by Dr Mingo Amber on 08-21-15. Symptoms likely worse with DVT, which was not  obvious on PE PTA.  15.benign positional vertigo resolved   Please page me if need to discuss [phone].  Thank you  LIVESAY,LENNIS P

## 2015-08-24 NOTE — Evaluation (Signed)
Physical Therapy One Time Evaluation Patient Details Name: RAY GIOVANNONI MRN: [id number] DOB: [birth year] Today's Date: 08/24/2015   History of Present Illness  50 y.o. female with PMH of Stage 2 Ovarian Ca, Morbid Obesity, Arthritis/DJD of knees R>L, was in radiology for restaging CT scans and was incidentally found to have suspected PE, subsequently had a CTA chest which showed R sided PE, also found to have R LE DVT  Clinical Impression  Patient evaluated by Physical Therapy with no further acute PT needs identified. All education has been completed and the patient has no further questions.  Pt reports decreased energy, limited endurance and generalized weakness since last chemo tx however states slowly improving.  Pt encouraged to continue her daily life despite fatigue if possible (has grandchildren she enjoys walking with and playing) and should would be agreeable to RW and either shower chair or bench (tub shower) to assist her with ADLs while she is going through cancer treatments. See below for any follow-up Physical Therapy or equipment needs. PT is signing off. Thank you for this referral.     Follow Up Recommendations No PT follow up    Equipment Recommendations  Rolling walker with 5" wheels;Other (comment) (would benefit from shower chair or bench (has tub shower))    Recommendations for Other Services       Precautions / Restrictions Precautions Precautions: None      Mobility  Bed Mobility Overal bed mobility: Modified Independent                Transfers Overall transfer level: Modified independent                  Ambulation/Gait Ambulation/Gait assistance: Supervision;Modified independent (Device/Increase time) Ambulation Distance (Feet): 200 Feet Assistive device: Rolling walker (2 wheeled) Gait Pattern/deviations: Step-through pattern;Decreased stride length;Wide base of support     General Gait Details: verbal cues for use of  RW  Stairs            Wheelchair Mobility    Modified Rankin (Stroke Patients Only)       Balance                                             Pertinent Vitals/Pain Pain Assessment:  (reports chronic bil knee pain however R knee improved since admission)    Home Living Family/patient expects to be discharged to:: Private residence Living Arrangements: Children   Type of Home: [place]: One level Home Equipment: None      Prior Function Level of Independence: Independent with assistive device(s)         Comments: states after her last 2 chemo tx, she has been more fatigued and needing RW or SPC (borrowed from family)     Hand Dominance        Extremity/Trunk Assessment               Lower Extremity Assessment: Generalized weakness (reports R knee steroid injection prior to admission, reports poor knees and recent weakness from chemo tx)         Communication   Communication: No difficulties  Cognition Arousal/Alertness: Awake/alert Behavior During Therapy: WFL for tasks assessed/performed Overall Cognitive Status: Within Functional Limits for tasks assessed  General Comments      Exercises        Assessment/Plan    PT Assessment Patent does not need any further PT services  PT Diagnosis Difficulty walking;Acute pain   PT Problem List    PT Treatment Interventions     PT Goals (Current goals can be found in the Care Plan section) Acute Rehab PT Goals PT Goal Formulation: All assessment and education complete, DC therapy    Frequency     Barriers to discharge        Co-evaluation               End of Session   Activity Tolerance: Patient tolerated treatment well Patient left: in chair;with call bell/phone within reach           Time: [id number] PT Time Calculation (min) (ACUTE ONLY): 15 min   Charges:   PT Evaluation $PT Eval Low Complexity: 1  Procedure     PT G Codes:        Torrion Witter,KATHrine E 08/24/2015, 12:51 PM Carmelia Bake, PT, DPT 08/24/2015 Pager: [phone]

## 2015-08-27 LAB — TYPE AND SCREEN
ABO/RH(D): A POS
Antibody Screen: NEGATIVE
Unit division: 0

## 2015-08-28 ENCOUNTER — Ambulatory Visit (HOSPITAL_BASED_OUTPATIENT_CLINIC_OR_DEPARTMENT_OTHER): Payer: BLUE CROSS/BLUE SHIELD | Admitting: Genetic Counselor

## 2015-08-28 ENCOUNTER — Other Ambulatory Visit: Payer: BLUE CROSS/BLUE SHIELD

## 2015-08-28 ENCOUNTER — Encounter: Payer: Self-pay | Admitting: Genetic Counselor

## 2015-08-28 DIAGNOSIS — C561 Malignant neoplasm of right ovary: Secondary | ICD-10-CM

## 2015-08-28 DIAGNOSIS — Z807 Family history of other malignant neoplasms of lymphoid, hematopoietic and related tissues: Secondary | ICD-10-CM

## 2015-08-28 DIAGNOSIS — Z8042 Family history of malignant neoplasm of prostate: Secondary | ICD-10-CM

## 2015-08-28 DIAGNOSIS — C541 Malignant neoplasm of endometrium: Secondary | ICD-10-CM

## 2015-08-28 DIAGNOSIS — Z315 Encounter for genetic counseling: Secondary | ICD-10-CM

## 2015-08-28 DIAGNOSIS — C562 Malignant neoplasm of left ovary: Secondary | ICD-10-CM | POA: Diagnosis not present

## 2015-08-28 DIAGNOSIS — Z800 Family history of malignant neoplasm of digestive organs: Secondary | ICD-10-CM

## 2015-08-28 NOTE — Progress Notes (Signed)
REFERRING PROVIDER: Evlyn Clines, MD  PRIMARY PROVIDER:  Annabell Sabal, MD  PRIMARY REASON FOR VISIT:  1. Ovarian carcinosarcoma, right ([place])   2. Endometrial ca ([place])   3. Family history of pancreatic cancer   4. Family history of prostate cancer   5. Family history of Hodgkin's disease      HISTORY OF PRESENT ILLNESS:   [name], a 50 y.o. female, was seen for a [place] cancer genetics consultation at the request of Dr. Marko Plume due to a personal history of ovarian and uterine cancers and family history of pancreatic, prostate, and other cancers.  [name] presents to clinic today with her mother to discuss the possibility of a hereditary predisposition to cancer, genetic testing, and to further clarify her future cancer risks, as well as potential cancer risks for family members.   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 This was treated with TAH-BSO and chemotherapy.   HORMONAL RISK FACTORS:  Menarche was at age 33.  First live birth at age 47.  OCP use for approximately 6-7 years.  Ovaries intact: no.  Hysterectomy: yes.  Menopausal status: postmenopausal.  HRT use: pt reports hx of use for approx 2 years to correct irregular periods, but not sure exactly what this was  Colonoscopy: yes; pt reports history of colon polyps (less than 10 total). Mammogram within the last year: no, she is overdue for one, but typically is good about getting them annually. Number of breast biopsies: 0. Up to date with pelvic exams:  yes. Any excessive radiation exposure in the past:  no, but does report history of secondhand smoke exposure  Past Medical History  Diagnosis Date  . Hypertension   . Hx of migraines   . IBS (irritable bowel syndrome)   . Diverticulitis with perforation 2010  . Family history of adverse reaction to  anesthesia     pts daughter had difficulty awakening following anesthesia   . History of bronchitis   . History of ear infections   . Kidney infection   . GERD (gastroesophageal reflux disease)   . Headache   . Morbid obesity ([place])   . Anemia   . Pyelonephritis   . Cough   . Ovarian cancer ([place])   . Endometrial cancer [facility])     Past Surgical History  Procedure Laterality Date  . Colostomy takedown    . Colostomy  2008  . Cesarean section  1983 and 1984  . Tonsillectomy and adenoidectomy  1997  . Laparotomy N/A 03/13/2015    Procedure: EXPLORATORY LAPAROTOMY;  Surgeon: Everitt Amber, MD;  Location: [facility];  Service: Gynecology;  Laterality: N/A;  . Abdominal hysterectomy N/A 03/13/2015    Procedure: TOTAL HYSTERECTOMY ABDOMINAL BILATERAL SALPINGO OOPHORECTOMY RADICAL TUMOR [place];  Surgeon: Everitt Amber, MD;  [facility];  Service: Gynecology;  Laterality: N/A;  . Bowel resection  03/13/2015    Procedure: SMALL BOWEL RESECTION;  Surgeon: Everitt Amber, MD;  [facility];  Service: Gynecology;;  . Ventral hernia repair  03/13/2015    Procedure: HERNIA REPAIR VENTRAL ADULT;  Surgeon: Everitt Amber, MD;  [facility];  Service: Gynecology;;    Social History   Social History  . Marital Status: Legally Separated    Spouse Name: N/A  . Number of Children: N/A  . Years of Education: N/A   Social History Main Topics  .  Smoking status: Never Smoker   . Smokeless tobacco: Never Used  . Alcohol Use: 0.0 oz/week     Comment: Maybe wine every 6 months  . Drug Use: No  . Sexual Activity: Yes     Comment: Told she could not have more children in 1986   Other Topics Concern  . 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   Married x 23 years, recently separated (4/15).  No domestic violence.  + financial stress.     Lives previously with husband who moved out,  now with daughter who has medical problems, including Hodgkin's disease, and granddaughter (age 43).     Uses seat belt.      FAMILY HISTORY:  We obtained a detailed, 4-generation family history.  Significant diagnoses are listed below: Family History  Problem Relation Age of Onset  . Diabetes Mother   . Hypertension Mother   . Hyperlipidemia Mother   . Colon polyps Mother     4 total  . Fibroids Mother     s/p hysterectomy  . Hyperlipidemia Father   . Diabetes Daughter   . Hodgkin's lymphoma Daughter 80  . Heart disease Neg Hx   . Stroke Neg Hx   . Asthma Maternal Aunt     severe - d. 5  . Prostate cancer Maternal Uncle 31  . Diabetes Paternal Aunt   . Diabetes Paternal Uncle   . Kidney failure Maternal Grandmother 84  . Pancreatic cancer Paternal Grandmother     dx. >50  . Diabetes Paternal Grandfather   . Diabetes Paternal Aunt   . Pancreatic cancer Cousin 72    paternal 1st cousin    [name] has one son who is 80 and one daughter who is 50.  Her daughter is in remission for Hodgkin's lymphoma, diagnosed at the age of 23.  [name] has four granddaughters.  She has two full sisters and two full brothers, all cancer-free and between the ages of 13-55.  [name] mother is currently 18 and has never had cancer.  She has a history of approximately 4 colon polyps and a history of a hysterectomy due to fibroids.  [name] father is currently 26 and has also never had cancer.    [name] mother has three full sisters and one full brother.  One sister died of severe asthma at the age of 108.  Two sisters are currently in their late 31s-early 70s and have never had cancer.  The brother is currently 74 and was diagnosed with prostate cancer last year.  [name] has 42 female first cousins and one female first cousin--none of whom have ever had cancer.  Her maternal grandmother died of kidney failure at the age of 53-85.  Her grandfather died when  her mother was 13 years old and she and her mother have no further information for him.    [name] father had four full brothers and three full sisters.  Only one sibling is still living, a brother, and he is currently 57-79 and has never had cancer.  He has two sons, one of whom was diagnosed with pancreatic cancer at 72. One sister died of diabetes at 50.  The other siblings passed away due to diabetes at later ages and never had cancer.  Ms. Dunsworth's paternal grandmother was diagnosed with pancreatic  cancer and passed away older than the age of 60.  Ms. Mauriello's paternal grandfather died of diabetes younger than age 24.  Both of Ms. Lerner's grandparents were only children.  Patient's maternal ancestors are of [place] American and Native American descent, and paternal ancestors are of African American descent. There is no reported Ashkenazi Jewish ancestry. There is no known consanguinity.  GENETIC COUNSELING ASSESSMENT: PRANAVI AURE is a 50 y.o. female with a personal and family history of cancer which is somewhat suggestive of a hereditary cancer syndrome and predisposition to cancer. We, therefore, discussed and recommended the following at today's visit.   DISCUSSION: We reviewed the characteristics, features and inheritance patterns of hereditary cancer syndromes, particularly those caused by mutations within the Lynch syndrome genes and the BRCA1/2 genes. We also discussed genetic testing, including the appropriate family members to test, the process of testing, insurance coverage and turn-around-time for results. We discussed the implications of a negative, positive and/or variant of uncertain significant result. We recommended [name] pursue genetic testing for the 32-gene Comprehensive Cancer Panel with MSH2 Exons 1-7 Inversion Analysis through [facility].  The Comprehensive Cancer Panel offered by [facility] includes sequencing and/or deletion duplication  testing of the following 32 genes: APC, ATM, AXIN2, BARD1, BMPR1A, BRCA1, BRCA2, BRIP1, CDH1, CDK4, CDKN2A, CHEK2, EPCAM, FANCC, MLH1, MSH2, MSH6, MUTYH, NBN, PALB2, PMS2, POLD1, POLE, PTEN, RAD51C, RAD51D, SCG5/GREM1, SMAD4, STK11, TP53, VHL, and XRCC2.     Based on Ms. Aime's personal and family history of cancer, she meets medical criteria for genetic testing. Despite that she meets criteria, she may still have an out of pocket cost. We discussed that if her out of pocket cost for testing is over $100, the laboratory will call and confirm whether she wants to proceed with testing.  If the out of pocket cost of testing is less than $100 she will be billed by the genetic testing laboratory.   PLAN: After considering the risks, benefits, and limitations, [name]  provided informed consent to pursue genetic testing and the blood sample will be drawn on Friday, 5/26 (with her other lab work) and then sent to [facility] for analysis of the 32-gene Comprehensive Cancer Panel. Results should be available within approximately 2-3 weeks' time, at which point they will be disclosed by telephone to [name], as will any additional recommendations warranted by these results. [name] will receive a summary of her genetic counseling visit and a copy of her results once available. This information will also be available in Epic. We encouraged [name] to remain in contact with cancer genetics annually so that we can continuously update the family history and inform her of any changes in cancer genetics and testing that may be of benefit for her family. [name] questions were answered to her satisfaction today. Our contact information was provided should additional questions or concerns arise.  Thank you for the referral and allowing [place] to share in the care of your patient.   Jeanine Luz, MS, [facility] Certified Genetic Counselor [email or web address] phone:  [phone]  The patient was seen for a total of 60 minutes in face-to-face genetic counseling.  This patient was discussed with Drs. Magrinat, Lindi Adie and/or Burr Medico who agrees with the above.    _______________________________________________________________________ For Office Staff:  Number of people involved in session: 2 Was an Intern/ student involved with case: no

## 2015-08-31 ENCOUNTER — Encounter: Payer: Self-pay | Admitting: Gynecologic Oncology

## 2015-08-31 ENCOUNTER — Ambulatory Visit: Payer: BLUE CROSS/BLUE SHIELD

## 2015-08-31 ENCOUNTER — Ambulatory Visit: Payer: BLUE CROSS/BLUE SHIELD | Attending: Gynecologic Oncology | Admitting: Gynecologic Oncology

## 2015-08-31 ENCOUNTER — Other Ambulatory Visit: Payer: BLUE CROSS/BLUE SHIELD

## 2015-08-31 VITALS — BP 145/93 | HR 87 | Temp 98.4°F | Resp 19 | Ht 65.5 in | Wt 346.3 lb

## 2015-08-31 DIAGNOSIS — Z90722 Acquired absence of ovaries, bilateral: Secondary | ICD-10-CM

## 2015-08-31 DIAGNOSIS — Z8543 Personal history of malignant neoplasm of ovary: Secondary | ICD-10-CM | POA: Diagnosis not present

## 2015-08-31 DIAGNOSIS — Z8249 Family history of ischemic heart disease and other diseases of the circulatory system: Secondary | ICD-10-CM | POA: Diagnosis not present

## 2015-08-31 DIAGNOSIS — C569 Malignant neoplasm of unspecified ovary: Secondary | ICD-10-CM

## 2015-08-31 DIAGNOSIS — Z880 Allergy status to penicillin: Secondary | ICD-10-CM | POA: Diagnosis not present

## 2015-08-31 DIAGNOSIS — I10 Essential (primary) hypertension: Secondary | ICD-10-CM | POA: Diagnosis not present

## 2015-08-31 DIAGNOSIS — Z8371 Family history of colonic polyps: Secondary | ICD-10-CM | POA: Insufficient documentation

## 2015-08-31 DIAGNOSIS — Z95828 Presence of other vascular implants and grafts: Secondary | ICD-10-CM

## 2015-08-31 DIAGNOSIS — Z6841 Body Mass Index (BMI) 40.0 and over, adult: Secondary | ICD-10-CM | POA: Diagnosis not present

## 2015-08-31 DIAGNOSIS — Z841 Family history of disorders of kidney and ureter: Secondary | ICD-10-CM | POA: Insufficient documentation

## 2015-08-31 DIAGNOSIS — Z8042 Family history of malignant neoplasm of prostate: Secondary | ICD-10-CM | POA: Diagnosis not present

## 2015-08-31 DIAGNOSIS — Z9221 Personal history of antineoplastic chemotherapy: Secondary | ICD-10-CM | POA: Insufficient documentation

## 2015-08-31 DIAGNOSIS — Z823 Family history of stroke: Secondary | ICD-10-CM | POA: Insufficient documentation

## 2015-08-31 DIAGNOSIS — K219 Gastro-esophageal reflux disease without esophagitis: Secondary | ICD-10-CM | POA: Insufficient documentation

## 2015-08-31 DIAGNOSIS — C541 Malignant neoplasm of endometrium: Secondary | ICD-10-CM | POA: Insufficient documentation

## 2015-08-31 DIAGNOSIS — K589 Irritable bowel syndrome without diarrhea: Secondary | ICD-10-CM | POA: Insufficient documentation

## 2015-08-31 DIAGNOSIS — Z90710 Acquired absence of both cervix and uterus: Secondary | ICD-10-CM | POA: Diagnosis not present

## 2015-08-31 DIAGNOSIS — Z9889 Other specified postprocedural states: Secondary | ICD-10-CM | POA: Diagnosis not present

## 2015-08-31 DIAGNOSIS — Z8542 Personal history of malignant neoplasm of other parts of uterus: Secondary | ICD-10-CM | POA: Diagnosis not present

## 2015-08-31 DIAGNOSIS — Z808 Family history of malignant neoplasm of other organs or systems: Secondary | ICD-10-CM | POA: Diagnosis not present

## 2015-08-31 DIAGNOSIS — Z86711 Personal history of pulmonary embolism: Secondary | ICD-10-CM | POA: Diagnosis not present

## 2015-08-31 DIAGNOSIS — Z825 Family history of asthma and other chronic lower respiratory diseases: Secondary | ICD-10-CM | POA: Insufficient documentation

## 2015-08-31 DIAGNOSIS — Z833 Family history of diabetes mellitus: Secondary | ICD-10-CM | POA: Diagnosis not present

## 2015-08-31 DIAGNOSIS — R51 Headache: Secondary | ICD-10-CM | POA: Insufficient documentation

## 2015-08-31 MED ORDER — HEPARIN SOD (PORK) LOCK FLUSH 100 UNIT/ML IV SOLN
500.0000 [IU] | Freq: Once | INTRAVENOUS | Status: AC | PRN
  Administered 2015-08-31: 500 [IU] via INTRAVENOUS
  Filled 2015-08-31: qty 5

## 2015-08-31 MED ORDER — SODIUM CHLORIDE 0.9 % IJ SOLN
10.0000 mL | INTRAMUSCULAR | Status: DC | PRN
  Administered 2015-08-31: 10 mL via INTRAVENOUS
  Filled 2015-08-31: qty 10

## 2015-08-31 NOTE — Patient Instructions (Signed)

## 2015-08-31 NOTE — Patient Instructions (Signed)
Plan to have a CT scan prior to your appointment with Dr. Denman George in August 2017.  Please call for any questions or concerns.

## 2015-08-31 NOTE — Progress Notes (Signed)
Followup of Consult: Gyn-Onc  CC:  Chief Complaint  Patient presents with  . Ovarian Cancer    follow-up    Assessment/Plan:  [name]  is a 50 y.o.  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  She is s/p exploratory laparotomy and TAH, BSO, optimal cytoreduction to no residual visible disease on 03/13/15. S/p 6 cycles of adjuvant carboplatin and paclitaxel chemotherapy.  New diagnosis of PE in May, 2017.  Complete response to treatment. Questionable pelvic nodularity on imaging - recommend repeating before she sees me again in 3 months.  CA 125 normal.  Will see Dr Marko Plume next week. She may be a good candidate for oral anticoagulant medications.   HPI: [name] is a very pleasant 50 year old woman with history of 2 prior cesarean sections who is seen initially in the hospital as an inpatient consultation at [facility] on 02/19/2015 for complex pelvic masses. The patient is a morbidly obese woman with a BMI 62 kg/m. She was admitted to [facility] on 02/18/2015 with pyelonephritis. CT imaging and then MRI imaging was performed to evaluate for an etiology for pyelonephritis. This identified A large pelvic mass which it to be arising from the right adnexa. It measured 13 x 9 x 17.5 cm and was irregular cystic and solid in nature. A second, but cystic and solid lesion within the left adnexa measuring 4.4 x 6.5 x 5.3 cm. In the third cystic and solid mass was seen anterior to the uterus and superior to the bladder measuring 4.2 x 5.4 x 5.3 cm. She had no pelvic lymphadenopathy, no carcinomatosis, and CA 125 was normal at 21U/mL.  Her infection was adequately treated with IV antibiotics and her acute kidney injury which manifested with an elevated creatinine 12.4 spontaneously resolved during her hospital stay with avoidance of NSAID and good hydration. She was noted to be anemic during her hospitalization with a  hemoglobin of 10.8. However this did not require transfusion and spontaneously improved over time.   On 03/13/2015 she underwent an exploratory laparotomy TAH/BSO and radical tumor debulking for a stage IIIc carcinosarcoma of the right ovary and an incidentally found synchronous stage I a endometrial cancer. Tumor was found with a large right ovarian mass, smaller left ovarian mass and a carcinosarcoma implant was involving the anterior cul-de-sac between the bladder and lower uterine segment. All disease sites were completely resected with no macroscopic disease [place] at the completion of debulking.   Interval History:  She went on to receive 6 cycles of adjuvant chemotherapy with carboplatin and paclitaxel between 04/12/15 and 08/02/15. She tolerated treatment well with some neutropenia. CA 125 was normal at 4.1 at completion of therapy. On 08/23/15 she underwent post-treatment CT imaging which showed: there is a 2.5 x 1.6 cm soft tissue nodule near the vaginal cuff. Within the right hemipelvis there is a 1.8 cm soft tissue nodule.   Current Meds:  Outpatient Encounter Prescriptions as of 08/31/2015  Medication Sig  . acetaminophen (TYLENOL) 500 MG tablet Take 1,000 mg by mouth every 6 (six) hours as needed for mild pain, moderate pain, fever or headache.  . clobetasol cream (TEMOVATE) 0.05 % Apply topically 2 (two) times daily. (Patient taking differently: Apply 1 application topically every other day. )  . docusate sodium (COLACE) 100 MG capsule Take 100 mg by mouth daily as needed for mild constipation. Reported on 08/23/2015  . enoxaparin (LOVENOX) 150 MG/ML injection Dose: 150mg  Subcutaneously every  12hours. Please dispense 1 month supply with 1 refill  . gabapentin (NEURONTIN) 300 MG capsule Take 1 capsule (300 mg total) by mouth at bedtime.  . lidocaine-prilocaine (EMLA) cream Apply to PAC site 1-2 hours prior to access as needed.  . loratadine (CLARITIN) 10 MG tablet Take 10 mg by mouth daily  as needed for allergies. Reported on 08/23/2015  . oxyCODONE (OXY IR/ROXICODONE) 5 MG immediate release tablet Take 1-2 tablets (5-10 mg total) by mouth every 4 (four) hours as needed for severe pain.  . pantoprazole (PROTONIX) 40 MG tablet Take 1 tablet (40 mg total) by mouth daily.  . polyethylene glycol (MIRALAX / GLYCOLAX) packet Take 17 g by mouth 2 (two) times daily as needed for mild constipation. Reported on 08/23/2015  . LORazepam (ATIVAN) 1 MG tablet Place 1/2 to 1 tablet under the tongue or swallow every 6 hours as needed for nausea. Will make drowsy. (Patient not taking: Reported on 08/31/2015)  . meclizine (ANTIVERT) 25 MG tablet Take 1 tablet (25 mg total) by mouth 3 (three) times daily as needed for dizziness. (Patient not taking: Reported on 08/31/2015)   Facility-Administered Encounter Medications as of 08/31/2015  Medication  . sodium chloride flush (NS) 0.9 % injection 10 mL  . sodium chloride flush (NS) 0.9 % injection 10 mL  . [DISCONTINUED] sodium chloride 0.9 % injection 10 mL    Allergy:  Allergies  Allergen Reactions  . Penicillins Rash    Has patient had a PCN reaction causing immediate rash, facial/tongue/throat swelling, SOB or lightheadedness with hypotension: no Has patient had a PCN reaction causing severe rash involving mucus membranes or skin necrosis: no Has patient had a PCN reaction that required hospitalization in the hospital at the time Has patient had a PCN reaction occurring within the last 10 years: no If all of the above answers are "NO", then may proceed with Cephalosporin use.     Social Hx:   Social History   Social History  . Marital Status: Legally Separated    Spouse Name: N/A  . Number of Children: N/A  . Years of Education: N/A   Occupational History  . Not on file.   Social History Main Topics  . Smoking status: Never Smoker   . Smokeless tobacco: Never Used  . Alcohol Use: 0.0 oz/week     Comment: Maybe wine every 6 months  .  Drug Use: No  . Sexual Activity: Yes     Comment: Told she could not have more children in 1986   Other Topics Concern  . 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   Married x 23 years, recently separated (4/15).  No domestic violence.  + financial stress.     Lives previously with husband who moved out, now with daughter who has medical problems, including Hodgkin's disease, and granddaughter (age 30).     Uses seat belt.     Past Surgical Hx:  Past Surgical History  Procedure Laterality Date  . Colostomy takedown    . Colostomy  2008  . Cesarean section  1983 and 1984  . Tonsillectomy and adenoidectomy  1997  . Laparotomy N/A 03/13/2015    Procedure: EXPLORATORY LAPAROTOMY;  Surgeon: Everitt Amber, MD;  [facility];  Service: Gynecology;  Laterality: N/A;  . Abdominal hysterectomy N/A 03/13/2015    Procedure: TOTAL  HYSTERECTOMY ABDOMINAL BILATERAL SALPINGO OOPHORECTOMY RADICAL TUMOR [place];  Surgeon: Everitt Amber, MD;  [facility];  Service: Gynecology;  Laterality: N/A;  . Bowel resection  03/13/2015    Procedure: SMALL BOWEL RESECTION;  Surgeon: Everitt Amber, MD;  [facility];  Service: Gynecology;;  . Ventral hernia repair  03/13/2015    Procedure: HERNIA REPAIR VENTRAL ADULT;  Surgeon: Everitt Amber, MD;  [facility];  Service: Gynecology;;    Past Medical Hx:  Past Medical History  Diagnosis Date  . Hypertension   . Hx of migraines   . IBS (irritable bowel syndrome)   . Diverticulitis with perforation 2010  . Family history of adverse reaction to anesthesia     pts daughter had difficulty awakening following anesthesia   . History of bronchitis   . History of ear infections   . Kidney infection   . GERD (gastroesophageal reflux disease)   . Headache   . Morbid obesity ([place])   . Anemia   . Pyelonephritis   . Cough   . Ovarian cancer  ([place])   . Endometrial cancer [facility])     Past Gynecological History:  C/s x 2  Patient's last menstrual period was 10/16/2013 (approximate).  Family Hx:  Family History  Problem Relation Age of Onset  . Diabetes Mother   . Hypertension Mother   . Hyperlipidemia Mother   . Colon polyps Mother     4 total  . Fibroids Mother     s/p hysterectomy  . Hyperlipidemia Father   . Diabetes Daughter   . Hodgkin's lymphoma Daughter 56  . Heart disease Neg Hx   . Stroke Neg Hx   . Asthma Maternal Aunt     severe - d. 68  . Prostate cancer Maternal Uncle 71  . Diabetes Paternal Aunt   . Diabetes Paternal Uncle   . Kidney failure Maternal Grandmother 84  . Pancreatic cancer Paternal Grandmother     dx. >50  . Diabetes Paternal Grandfather   . Diabetes Paternal Aunt   . Pancreatic cancer Cousin 84    paternal 1st cousin    Review of Systems:  Constitutional  Feels well,    ENT Normal appearing ears and nares bilaterally Skin/Breast  No rash, sores, jaundice, itching, dryness Cardiovascular  No chest pain, shortness of breath, or edema  Pulmonary  No cough or wheeze.  Gastro Intestinal  No nausea, vomitting, or diarrhoea. No bright red blood per rectum, nochange in bowel movement, or constipation.  Genito Urinary  No frequency, urgency, dysuria,  Musculo Skeletal  No myalgia, arthralgia, joint swelling or pain  Neurologic  No weakness, numbness, change in gait,  Psychology  No depression, anxiety, insomnia.   Vitals:  Blood pressure 145/93, pulse 87, temperature 98.4 F (36.9 C), temperature source Oral, resp. rate 19, height 5' 5.5" (1.664 m), weight 346 lb 4.8 oz (157.081 kg), last menstrual period 10/16/2013, SpO2 100 %.  Physical Exam: WD in NAD Neck  Supple NROM, without any enlargements.  Lymph Node Survey No cervical supraclavicular or inguinal adenopathy Cardiovascular  Pulse normal rate, regularity and rhythm. S1 and S2 normal.  Lungs  Clear to auscultation  bilateraly, without wheezes/crackles/rhonchi. Good air movement.  Skin  No rash/lesions/breakdown  Psychiatry  Alert and oriented to person, place, and time  Abdomen  Normoactive bowel sounds, abdomen soft, non-tender and obese without evidence of hernia. There is a large midline incision which is healed Back No CVA tenderness Genito Urinary  Vulva/vagina: Normal  external female genitalia.  No lesions. No discharge or bleeding.  Bladder/urethra:  No lesions or masses, well supported bladder  Vagina: cuff in tact.  Cervix and uterus: surgically absent  Adnexa: no palpable masses. Rectal  Good tone, no masses no cul de sac nodularity.  Extremities  No bilateral cyanosis, clubbing or edema.   Donaciano Eva, MD  08/31/2015, 1:34 PM

## 2015-09-05 ENCOUNTER — Ambulatory Visit (HOSPITAL_BASED_OUTPATIENT_CLINIC_OR_DEPARTMENT_OTHER): Payer: BLUE CROSS/BLUE SHIELD | Admitting: Nurse Practitioner

## 2015-09-05 ENCOUNTER — Other Ambulatory Visit (HOSPITAL_BASED_OUTPATIENT_CLINIC_OR_DEPARTMENT_OTHER): Payer: BLUE CROSS/BLUE SHIELD

## 2015-09-05 ENCOUNTER — Other Ambulatory Visit: Payer: Self-pay

## 2015-09-05 ENCOUNTER — Telehealth: Payer: Self-pay | Admitting: Oncology

## 2015-09-05 ENCOUNTER — Telehealth: Payer: Self-pay | Admitting: *Deleted

## 2015-09-05 ENCOUNTER — Ambulatory Visit: Payer: BLUE CROSS/BLUE SHIELD

## 2015-09-05 VITALS — BP 136/72 | HR 94 | Temp 97.7°F | Resp 18 | Ht 65.5 in | Wt 347.0 lb

## 2015-09-05 DIAGNOSIS — Z7901 Long term (current) use of anticoagulants: Secondary | ICD-10-CM | POA: Diagnosis not present

## 2015-09-05 DIAGNOSIS — Z95828 Presence of other vascular implants and grafts: Secondary | ICD-10-CM

## 2015-09-05 DIAGNOSIS — Z86711 Personal history of pulmonary embolism: Secondary | ICD-10-CM | POA: Diagnosis not present

## 2015-09-05 DIAGNOSIS — Z86718 Personal history of other venous thrombosis and embolism: Secondary | ICD-10-CM | POA: Diagnosis not present

## 2015-09-05 DIAGNOSIS — I2690 Septic pulmonary embolism without acute cor pulmonale: Secondary | ICD-10-CM

## 2015-09-05 DIAGNOSIS — C569 Malignant neoplasm of unspecified ovary: Secondary | ICD-10-CM

## 2015-09-05 DIAGNOSIS — T148 Other injury of unspecified body region: Secondary | ICD-10-CM | POA: Diagnosis not present

## 2015-09-05 DIAGNOSIS — T148XXA Other injury of unspecified body region, initial encounter: Secondary | ICD-10-CM

## 2015-09-05 LAB — CBC WITH DIFFERENTIAL/PLATELET
BASO%: 0.4 % (ref 0.0–2.0)
BASOS ABS: 0 10*3/uL (ref 0.0–0.1)
EOS%: 2 % (ref 0.0–7.0)
Eosinophils Absolute: 0.1 10*3/uL (ref 0.0–0.5)
HEMATOCRIT: 29.2 % — AB (ref 34.8–46.6)
HEMOGLOBIN: 9.6 g/dL — AB (ref 11.6–15.9)
LYMPH#: 1.3 10*3/uL (ref 0.9–3.3)
LYMPH%: 20.8 % (ref 14.0–49.7)
MCH: 34.3 pg — AB (ref 25.1–34.0)
MCHC: 32.9 g/dL (ref 31.5–36.0)
MCV: 104.5 fL — ABNORMAL HIGH (ref 79.5–101.0)
MONO#: 0.5 10*3/uL (ref 0.1–0.9)
MONO%: 8.4 % (ref 0.0–14.0)
NEUT%: 68.4 % (ref 38.4–76.8)
NEUTROS ABS: 4.2 10*3/uL (ref 1.5–6.5)
Platelets: 204 10*3/uL (ref 145–400)
RBC: 2.79 10*6/uL — ABNORMAL LOW (ref 3.70–5.45)
RDW: 16.1 % — AB (ref 11.2–14.5)
WBC: 6.1 10*3/uL (ref 3.9–10.3)

## 2015-09-05 LAB — COMPREHENSIVE METABOLIC PANEL
ALBUMIN: 3.3 g/dL — AB (ref 3.5–5.0)
ALK PHOS: 98 U/L (ref 40–150)
ALT: 17 U/L (ref 0–55)
AST: 15 U/L (ref 5–34)
Anion Gap: 8 mEq/L (ref 3–11)
BUN: 21.4 mg/dL (ref 7.0–26.0)
CO2: 24 mEq/L (ref 22–29)
CREATININE: 1.1 mg/dL (ref 0.6–1.1)
Calcium: 9.7 mg/dL (ref 8.4–10.4)
Chloride: 108 mEq/L (ref 98–109)
EGFR: 65 mL/min/{1.73_m2} — ABNORMAL LOW (ref >90)
GLUCOSE: 138 mg/dL (ref 70–140)
POTASSIUM: 4.7 meq/L (ref 3.5–5.1)
SODIUM: 140 meq/L (ref 136–145)
Total Bilirubin: 0.33 mg/dL (ref 0.20–1.20)
Total Protein: 6.8 g/dL (ref 6.4–8.3)

## 2015-09-05 MED ORDER — SODIUM CHLORIDE 0.9% FLUSH
10.0000 mL | INTRAVENOUS | Status: DC | PRN
  Administered 2015-09-05: 10 mL via INTRAVENOUS
  Filled 2015-09-05: qty 10

## 2015-09-05 MED ORDER — HEPARIN SOD (PORK) LOCK FLUSH 100 UNIT/ML IV SOLN
500.0000 [IU] | Freq: Once | INTRAVENOUS | Status: AC
  Administered 2015-09-05: 500 [IU] via INTRAVENOUS
  Filled 2015-09-05: qty 5

## 2015-09-05 NOTE — Telephone Encounter (Signed)
spoke w/ pt confirmed 5/31 apt time

## 2015-09-05 NOTE — Telephone Encounter (Signed)
TC from patient stating that she needs an MD visit today as she had been in the hospital for blood clots and is now on Lovenox injections 2 x a day. She states she is developing very tender 'knots' and bruises on her legs and arms that are occuring spontaneously. Denies falls, injury etc. Denies fever, chills. Dr. Marko Plume is not in today so will have patient seen in [facility] by Selena Lesser, NP Urgent POF sent

## 2015-09-05 NOTE — Patient Instructions (Signed)

## 2015-09-06 ENCOUNTER — Ambulatory Visit (INDEPENDENT_AMBULATORY_CARE_PROVIDER_SITE_OTHER): Payer: BLUE CROSS/BLUE SHIELD | Admitting: Licensed Clinical Social Worker

## 2015-09-06 ENCOUNTER — Telehealth: Payer: Self-pay | Admitting: Oncology

## 2015-09-06 ENCOUNTER — Encounter: Payer: Self-pay | Admitting: Nurse Practitioner

## 2015-09-06 DIAGNOSIS — Z638 Other specified problems related to primary support group: Secondary | ICD-10-CM

## 2015-09-06 DIAGNOSIS — F439 Reaction to severe stress, unspecified: Secondary | ICD-10-CM

## 2015-09-06 DIAGNOSIS — T148XXA Other injury of unspecified body region, initial encounter: Secondary | ICD-10-CM | POA: Insufficient documentation

## 2015-09-06 DIAGNOSIS — Z7901 Long term (current) use of anticoagulants: Secondary | ICD-10-CM | POA: Insufficient documentation

## 2015-09-06 NOTE — Progress Notes (Signed)
SYMPTOM MANAGEMENT CLINIC    Chief Complaint: Hematoma  HPI:  [name] 50 y.o. female diagnosed with ovarian cancer.  Patient is status post carboplatin/Taxol chemotherapy regimen.  Currently undergoing observation only.  Patient states that she has been developing multiple, firm, tender bruises to various areas of her body within the past several weeks.  She denies any known injury or trauma to the site.  Patient continues to take Lovenox injections on a twice-daily basis for history of both DVT and PE in the past.  Exam today reveals multiple healing bruises and hematomas to her arms and her legs.  Some are slightly firm; but appear to be resolving.  There is no evidence of infection.  Advised patient that this is most likely from inadvertently hitting her extremity against a hard object; and secondary to her anticoagulation therapy.  Blood counts were stable today, and platelets were within normal range.  Patient was advised to call/return if she develops any worsening symptoms whatsoever.    No history exists.    Review of Systems  Skin:       Patient has multiple healing bruises to various areas of her body.  All other systems reviewed and are negative.   Past Medical History  Diagnosis Date  . Hypertension   . Hx of migraines   . IBS (irritable bowel syndrome)   . Diverticulitis with perforation 2010  . Family history of adverse reaction to anesthesia     pts daughter had difficulty awakening following anesthesia   . History of bronchitis   . History of ear infections   . Kidney infection   . GERD (gastroesophageal reflux disease)   . Headache   . Morbid obesity ([place])   . Anemia   . Pyelonephritis   . Cough   . Ovarian cancer ([place])   . Endometrial cancer [facility])     Past Surgical History  Procedure Laterality Date  . Colostomy takedown    . Colostomy  2008  . Cesarean section  1983 and 1984  . Tonsillectomy and adenoidectomy  1997  . Laparotomy  N/A 03/13/2015    Procedure: EXPLORATORY LAPAROTOMY;  Surgeon: Everitt Amber, MD;  Location: [facility];  Service: Gynecology;  Laterality: N/A;  . Abdominal hysterectomy N/A 03/13/2015    Procedure: TOTAL HYSTERECTOMY ABDOMINAL BILATERAL SALPINGO OOPHORECTOMY RADICAL TUMOR [place];  Surgeon: Everitt Amber, MD;  Location: [facility];  Service: Gynecology;  Laterality: N/A;  . Bowel resection  03/13/2015    Procedure: SMALL BOWEL RESECTION;  Surgeon: Everitt Amber, MD;  Location: [facility];  Service: Gynecology;;  . Ventral hernia repair  03/13/2015    Procedure: HERNIA REPAIR VENTRAL ADULT;  Surgeon: Everitt Amber, MD;  Location: [facility];  Service: Gynecology;;    has Right knee pain; Seasonal allergies; Left knee pain; Rash and nonspecific skin eruption; Stress at home; Essential hypertension, benign; Morbid obesity with BMI of 60.0-69.9, adult ([place]); Heartburn; Ventral hernia without obstruction or gangrene; Ovarian carcinosarcoma ([place]); Endometrial ca [facility]); Iron deficiency anemia due to chronic blood loss; CKD (chronic kidney disease) stage 3, GFR 30-59 ml/min; Constipation; Esophageal reflux; Port catheter in place; Chemotherapy-induced peripheral neuropathy ([place]); Slow rate of speech; Pulmonary embolism without acute cor pulmonale ([place]); Hematoma; and Long term current use of anticoagulant therapy on her problem list.    is allergic to penicillins.    Medication List       This list is accurate as of: 09/05/15 11:59 PM.  Always use your most recent med  list.               acetaminophen 500 MG tablet  Commonly known as:  TYLENOL  Take 1,000 mg by mouth every 6 (six) hours as needed for mild pain, moderate pain, fever or headache.     clobetasol cream 0.05 %  Commonly known as:  TEMOVATE  Apply topically 2 (two) times daily.     docusate sodium 100 MG capsule  Commonly known as:  COLACE  Take 100 mg by mouth daily as needed for mild constipation. Reported on 08/23/2015     enoxaparin 150 MG/ML injection    Commonly known as:  LOVENOX  Dose: 17m Subcutaneously every 12hours. Please dispense 1 month supply with 1 refill     gabapentin 300 MG capsule  Commonly known as:  NEURONTIN  Take 1 capsule (300 mg total) by mouth at bedtime.     lidocaine-prilocaine cream  Commonly known as:  EMLA  Apply to PAC site 1-2 hours prior to access as needed.     loratadine 10 MG tablet  Commonly known as:  CLARITIN  Take 10 mg by mouth daily as needed for allergies. Reported on 08/23/2015     LORazepam 1 MG tablet  Commonly known as:  ATIVAN  Place 1/2 to 1 tablet under the tongue or swallow every 6 hours as needed for nausea. Will make drowsy.     meclizine 25 MG tablet  Commonly known as:  ANTIVERT  Take 1 tablet (25 mg total) by mouth 3 (three) times daily as needed for dizziness.     oxyCODONE 5 MG immediate release tablet  Commonly known as:  Oxy IR/ROXICODONE  Take 1-2 tablets (5-10 mg total) by mouth every 4 (four) hours as needed for severe pain.     pantoprazole 40 MG tablet  Commonly known as:  PROTONIX  Take 1 tablet (40 mg total) by mouth daily.     polyethylene glycol packet  Commonly known as:  MIRALAX / GLYCOLAX  Take 17 g by mouth 2 (two) times daily as needed for mild constipation. Reported on 08/23/2015         PHYSICAL EXAMINATION  Oncology Vitals 09/05/2015 08/31/2015  Height 166 cm 166 cm  Weight 157.398 kg 157.081 kg  Weight (lbs) 347 lbs 346 lbs 5 oz  BMI (kg/m2) 56.87 kg/m2 56.75 kg/m2  Temp 97.7 98.4  Pulse 94 87  Resp 18 19  SpO2 100 100  BSA (m2) 2.7 m2 2.69 m2   BP Readings from Last 2 Encounters:  09/05/15 136/72  08/31/15 145/93    Physical Exam  Constitutional: She is oriented to person, place, and time and well-developed, well-nourished, and in no distress.  HENT:  Head: Normocephalic and atraumatic.  Mouth/Throat: Oropharynx is clear and moist.  Eyes: Conjunctivae and EOM are normal. Pupils are equal, round, and reactive to light. Right eye  exhibits no discharge. Left eye exhibits no discharge. No scleral icterus.  Neck: Normal range of motion.  Pulmonary/Chest: Effort normal. No respiratory distress.  Musculoskeletal: Normal range of motion. She exhibits no edema or tenderness.  Neurological: She is alert and oriented to person, place, and time. Gait normal.  Skin: Skin is warm and dry. No rash noted. No erythema. No pallor.  Patient has multiple healing, slightly firm-but resolving bruises and hematomas to her arms and legs.  No evidence of infection.  Psychiatric: Affect normal.    LABORATORY DATA:. Appointment on 09/05/2015  Component Date Value Ref Range Status  .  WBC 09/05/2015 6.1  3.9 - 10.3 10e3/uL Final  . NEUT# 09/05/2015 4.2  1.5 - 6.5 10e3/uL Final  . HGB 09/05/2015 9.6* 11.6 - 15.9 g/dL Final  . HCT 09/05/2015 29.2* 34.8 - 46.6 % Final  . Platelets 09/05/2015 204  145 - 400 10e3/uL Final  . MCV 09/05/2015 104.5* 79.5 - 101.0 fL Final  . MCH 09/05/2015 34.3* 25.1 - 34.0 pg Final  . MCHC 09/05/2015 32.9  31.5 - 36.0 g/dL Final  . RBC 09/05/2015 2.79* 3.70 - 5.45 10e6/uL Final  . RDW 09/05/2015 16.1* 11.2 - 14.5 % Final  . lymph# 09/05/2015 1.3  0.9 - 3.3 10e3/uL Final  . MONO# 09/05/2015 0.5  0.1 - 0.9 10e3/uL Final  . Eosinophils Absolute 09/05/2015 0.1  0.0 - 0.5 10e3/uL Final  . Basophils Absolute 09/05/2015 0.0  0.0 - 0.1 10e3/uL Final  . NEUT% 09/05/2015 68.4  38.4 - 76.8 % Final  . LYMPH% 09/05/2015 20.8  14.0 - 49.7 % Final  . MONO% 09/05/2015 8.4  0.0 - 14.0 % Final  . EOS% 09/05/2015 2.0  0.0 - 7.0 % Final  . BASO% 09/05/2015 0.4  0.0 - 2.0 % Final  . Sodium 09/05/2015 140  136 - 145 mEq/L Final  . Potassium 09/05/2015 4.7  3.5 - 5.1 mEq/L Final  . Chloride 09/05/2015 108  98 - 109 mEq/L Final  . CO2 09/05/2015 24  22 - 29 mEq/L Final  . Glucose 09/05/2015 138  70 - 140 mg/dl Final   Glucose reference range is for nonfasting patients. Fasting glucose reference range is 70- 100.  Marland Kitchen BUN  09/05/2015 21.4  7.0 - 26.0 mg/dL Final  . Creatinine 09/05/2015 1.1  0.6 - 1.1 mg/dL Final  . Total Bilirubin 09/05/2015 0.33  0.20 - 1.20 mg/dL Final  . Alkaline Phosphatase 09/05/2015 98  40 - 150 U/L Final  . AST 09/05/2015 15  5 - 34 U/L Final  . ALT 09/05/2015 17  0 - 55 U/L Final  . Total Protein 09/05/2015 6.8  6.4 - 8.3 g/dL Final  . Albumin 09/05/2015 3.3* 3.5 - 5.0 g/dL Final  . Calcium 09/05/2015 9.7  8.4 - 10.4 mg/dL Final  . Anion Gap 09/05/2015 8  3 - 11 mEq/L Final  . EGFR 09/05/2015 65* >90 ml/min/1.73 m2 Final   eGFR is calculated using the CKD-EPI Creatinine Equation (2009)    RADIOGRAPHIC STUDIES: No results found.  ASSESSMENT/PLAN:    Ovarian carcinosarcoma [facility]) Patient received her last carotid plan/Taxol chemotherapy regimen on 08/02/2015.  She is currently undergoing observation only.  Patient scheduled to return on 09/10/2015 for labs, flush, and a visit.  Hematoma Patient states that she has been developing multiple, firm, tender bruises to various areas of her body within the past several weeks.  She denies any known injury or trauma to the site.  Patient continues to take Lovenox injections on a twice-daily basis for history of both DVT and PE in the past.  Exam today reveals multiple healing bruises and hematomas to her arms and her legs.  Some are slightly firm; but appear to be resolving.  There is no evidence of infection.  Advised patient that this is most likely from inadvertently hitting her extremity against a hard object; and secondary to her anticoagulation therapy.  Blood counts were stable today, and platelets were within normal range.  Patient was advised to call/return if she develops any worsening symptoms whatsoever.  Long term current use of anticoagulant therapy Patient has  been diagnosed with both a DVT and PE in the past; and continues with twice daily Lovenox injections.   Patient stated understanding of all instructions; and was  in agreement with this plan of care. The patient knows to call the clinic with any problems, questions or concerns.   Total time spent with patient was 25 minutes;  with greater than 85 percent of that time spent in face to face counseling regarding patient's symptoms,  and coordination of care and follow up.  Disclaimer:This dictation was prepared with Dragon/digital dictation along with [employer]. Any transcriptional errors that result from this process are unintentional.  Drue Second, NP 09/06/2015

## 2015-09-06 NOTE — Assessment & Plan Note (Signed)
Patient has been diagnosed with both a DVT and PE in the past; and continues with twice daily Lovenox injections.

## 2015-09-06 NOTE — Progress Notes (Signed)
Patient ID: [name], female   DOB: 05/06/1965, 50 y.o.   MRN: [id number]   Patient referred to [facility] by Dr. Mingo Amber for assistance with financial stressors and adjustment to diagnosis.     Presenting Issue: Patient presents positive, engaged and tears of joy of now being cancer free. Great family support.  Current stressors include: no income due to not being able to return to work and adjusting to cancer diagnosis.  Patient has been impacted by her cancer diagnosis and treatment  in all areas of her life to include unsteady gait, social activities and career.   Report of symptoms:  Patient reports symptoms of feeling tired and difficulty with sleep but states this is related to her medication.  Patient denies smoking, alcohol, drugs, SI, and HI.  No AH, VH, inpatient psych treatment and no outpatient treatment.  Reports one family member with a mental health diagnosis.   Assessment, Plan/Recommendations Patient would benefit from brief therapeutic interventions to develop coping skill for financial stressors, adjust to her new normal and identifying next steps in her life once released by her doctor. Patient's treatment plan was discussed and goals were established.  Interventions will include a combination of Motivational Interviewing and behavioral activation.  Treatment today focused on establishing a therapeutic relationship, and treatment goals. Patient is in agreement to return to [facility] clinic to work on established goals Patient's Goals: . Find a new normal . Identify community support . Healthy weight . Identify what patient is able to do . Give back to community . Go back to school . Go back to work [employer]:  Relaxed breathing, apply for food stamps, and review cancer support group information and identify a group to attend.   [name]. [place] Work,  [phone] 2:42 PM

## 2015-09-06 NOTE — Telephone Encounter (Signed)
per pof to sch pt appt-cld & spoke to pt and gave pt time & date of appt °

## 2015-09-06 NOTE — Assessment & Plan Note (Signed)
Patient states that she has been developing multiple, firm, tender bruises to various areas of her body within the past several weeks.  She denies any known injury or trauma to the site.  Patient continues to take Lovenox injections on a twice-daily basis for history of both DVT and PE in the past.  Exam today reveals multiple healing bruises and hematomas to her arms and her legs.  Some are slightly firm; but appear to be resolving.  There is no evidence of infection.  Advised patient that this is most likely from inadvertently hitting her extremity against a hard object; and secondary to her anticoagulation therapy.  Blood counts were stable today, and platelets were within normal range.  Patient was advised to call/return if she develops any worsening symptoms whatsoever.

## 2015-09-06 NOTE — Assessment & Plan Note (Signed)
Patient received her last carotid plan/Taxol chemotherapy regimen on 08/02/2015.  She is currently undergoing observation only.  Patient scheduled to return on 09/10/2015 for labs, flush, and a visit.

## 2015-09-09 ENCOUNTER — Other Ambulatory Visit: Payer: Self-pay | Admitting: Oncology

## 2015-09-09 DIAGNOSIS — C561 Malignant neoplasm of right ovary: Secondary | ICD-10-CM

## 2015-09-09 DIAGNOSIS — I2699 Other pulmonary embolism without acute cor pulmonale: Secondary | ICD-10-CM

## 2015-09-10 ENCOUNTER — Encounter: Payer: Self-pay | Admitting: Oncology

## 2015-09-10 ENCOUNTER — Encounter: Payer: Self-pay | Admitting: *Deleted

## 2015-09-10 ENCOUNTER — Ambulatory Visit (HOSPITAL_BASED_OUTPATIENT_CLINIC_OR_DEPARTMENT_OTHER): Payer: BLUE CROSS/BLUE SHIELD | Admitting: Oncology

## 2015-09-10 ENCOUNTER — Other Ambulatory Visit (HOSPITAL_BASED_OUTPATIENT_CLINIC_OR_DEPARTMENT_OTHER): Payer: BLUE CROSS/BLUE SHIELD

## 2015-09-10 ENCOUNTER — Ambulatory Visit: Payer: BLUE CROSS/BLUE SHIELD

## 2015-09-10 ENCOUNTER — Telehealth: Payer: Self-pay | Admitting: Oncology

## 2015-09-10 VITALS — BP 120/90 | HR 89 | Temp 98.5°F | Resp 19 | Ht 65.5 in | Wt 345.0 lb

## 2015-09-10 DIAGNOSIS — K219 Gastro-esophageal reflux disease without esophagitis: Secondary | ICD-10-CM

## 2015-09-10 DIAGNOSIS — C569 Malignant neoplasm of unspecified ovary: Secondary | ICD-10-CM

## 2015-09-10 DIAGNOSIS — C562 Malignant neoplasm of left ovary: Secondary | ICD-10-CM | POA: Diagnosis not present

## 2015-09-10 DIAGNOSIS — D701 Agranulocytosis secondary to cancer chemotherapy: Secondary | ICD-10-CM | POA: Diagnosis not present

## 2015-09-10 DIAGNOSIS — C561 Malignant neoplasm of right ovary: Secondary | ICD-10-CM

## 2015-09-10 DIAGNOSIS — C541 Malignant neoplasm of endometrium: Secondary | ICD-10-CM

## 2015-09-10 DIAGNOSIS — G620 Drug-induced polyneuropathy: Secondary | ICD-10-CM

## 2015-09-10 DIAGNOSIS — I2699 Other pulmonary embolism without acute cor pulmonale: Secondary | ICD-10-CM

## 2015-09-10 DIAGNOSIS — Z86711 Personal history of pulmonary embolism: Secondary | ICD-10-CM | POA: Diagnosis not present

## 2015-09-10 DIAGNOSIS — G622 Polyneuropathy due to other toxic agents: Secondary | ICD-10-CM

## 2015-09-10 DIAGNOSIS — Z95828 Presence of other vascular implants and grafts: Secondary | ICD-10-CM

## 2015-09-10 DIAGNOSIS — D649 Anemia, unspecified: Secondary | ICD-10-CM

## 2015-09-10 DIAGNOSIS — Z86718 Personal history of other venous thrombosis and embolism: Secondary | ICD-10-CM | POA: Diagnosis not present

## 2015-09-10 DIAGNOSIS — D6959 Other secondary thrombocytopenia: Secondary | ICD-10-CM

## 2015-09-10 DIAGNOSIS — T451X5A Adverse effect of antineoplastic and immunosuppressive drugs, initial encounter: Secondary | ICD-10-CM

## 2015-09-10 DIAGNOSIS — D6481 Anemia due to antineoplastic chemotherapy: Secondary | ICD-10-CM

## 2015-09-10 DIAGNOSIS — Z7901 Long term (current) use of anticoagulants: Secondary | ICD-10-CM

## 2015-09-10 DIAGNOSIS — D509 Iron deficiency anemia, unspecified: Secondary | ICD-10-CM

## 2015-09-10 DIAGNOSIS — I82401 Acute embolism and thrombosis of unspecified deep veins of right lower extremity: Secondary | ICD-10-CM

## 2015-09-10 DIAGNOSIS — M25561 Pain in right knee: Secondary | ICD-10-CM

## 2015-09-10 DIAGNOSIS — Z6841 Body Mass Index (BMI) 40.0 and over, adult: Secondary | ICD-10-CM

## 2015-09-10 LAB — CBC WITH DIFFERENTIAL/PLATELET
BASO%: 0.4 % (ref 0.0–2.0)
Basophils Absolute: 0 10*3/uL (ref 0.0–0.1)
EOS ABS: 0.1 10*3/uL (ref 0.0–0.5)
EOS%: 1.8 % (ref 0.0–7.0)
HCT: 29.8 % — ABNORMAL LOW (ref 34.8–46.6)
HEMOGLOBIN: 9.7 g/dL — AB (ref 11.6–15.9)
LYMPH#: 1.3 10*3/uL (ref 0.9–3.3)
LYMPH%: 19.8 % (ref 14.0–49.7)
MCH: 34 pg (ref 25.1–34.0)
MCHC: 32.7 g/dL (ref 31.5–36.0)
MCV: 104 fL — ABNORMAL HIGH (ref 79.5–101.0)
MONO#: 0.6 10*3/uL (ref 0.1–0.9)
MONO%: 9.2 % (ref 0.0–14.0)
NEUT%: 68.8 % (ref 38.4–76.8)
NEUTROS ABS: 4.7 10*3/uL (ref 1.5–6.5)
Platelets: 209 10*3/uL (ref 145–400)
RBC: 2.86 10*6/uL — ABNORMAL LOW (ref 3.70–5.45)
RDW: 15.2 % — AB (ref 11.2–14.5)
WBC: 6.8 10*3/uL (ref 3.9–10.3)

## 2015-09-10 LAB — COMPREHENSIVE METABOLIC PANEL
ALBUMIN: 3.4 g/dL — AB (ref 3.5–5.0)
ALK PHOS: 109 U/L (ref 40–150)
ALT: 15 U/L (ref 0–55)
AST: 13 U/L (ref 5–34)
Anion Gap: 9 mEq/L (ref 3–11)
BUN: 24.5 mg/dL (ref 7.0–26.0)
CO2: 24 mEq/L (ref 22–29)
CREATININE: 1.2 mg/dL — AB (ref 0.6–1.1)
Calcium: 10.1 mg/dL (ref 8.4–10.4)
Chloride: 107 mEq/L (ref 98–109)
EGFR: 60 mL/min/{1.73_m2} — ABNORMAL LOW (ref >90)
GLUCOSE: 101 mg/dL (ref 70–140)
Potassium: 4.9 mEq/L (ref 3.5–5.1)
SODIUM: 139 meq/L (ref 136–145)
TOTAL PROTEIN: 7.3 g/dL (ref 6.4–8.3)

## 2015-09-10 LAB — PROTIME-INR
INR: 1 — ABNORMAL LOW (ref 2.00–3.50)
Protime: 12 Seconds (ref 10.6–13.4)

## 2015-09-10 MED ORDER — HEPARIN SOD (PORK) LOCK FLUSH 100 UNIT/ML IV SOLN
500.0000 [IU] | Freq: Once | INTRAVENOUS | Status: AC | PRN
  Administered 2015-09-10: 500 [IU] via INTRAVENOUS
  Filled 2015-09-10: qty 5

## 2015-09-10 MED ORDER — SODIUM CHLORIDE 0.9 % IJ SOLN
10.0000 mL | INTRAMUSCULAR | Status: DC | PRN
  Administered 2015-09-10: 10 mL via INTRAVENOUS
  Filled 2015-09-10: qty 10

## 2015-09-10 MED ORDER — OXYCODONE HCL 5 MG PO TABS: 5.0000 mg | ORAL_TABLET | ORAL | Status: DC | PRN

## 2015-09-10 MED ORDER — PANTOPRAZOLE SODIUM 40 MG PO TBEC: 40.0000 mg | DELAYED_RELEASE_TABLET | Freq: Every day | ORAL | Status: DC

## 2015-09-10 NOTE — Telephone Encounter (Signed)
appt made and avs printed °

## 2015-09-10 NOTE — Progress Notes (Signed)
[place] Work  Clinical Social Work met with patient in medical oncology exam room to provide survivorship resources.  CSW provided patient with information on [employer], [employer] hiking trail program, Finding Your New Normal survivorship class, and monthly support program calendar.  CSW encouraged patient to call with any questions or concerns.  Polo Riley, MSW, LCSW, OSW-C Clinical Social Worker [facility] [phone]

## 2015-09-10 NOTE — Progress Notes (Signed)
OFFICE PROGRESS NOTE   September 10, 2015   Physicians: Everitt Amber, Alveda Reasons, MD (PCP),  INTERVAL HISTORY:   Patient is seen, together with mother, in continuing attention to IIB carcinosarcoma of right ovary and recently documented pulmonary emboli with RLE DVT.  She completed cycle 6 carbo taxol on 08-02-15, with restaging CT CAP on 08-23-15 showing unexpected PE. She was admitted 5-18 to 08-24-15 to initiate anticoagulation, presently on lovenox 150 mg bid. She saw Dr Denman George on 08-31-15, with question of some nodularity in pelvis and plan for repeat CT in August prior to visit back to Dr Denman George then.  Patient denies increased SOB, any chest pain, any bleeding. She has bruises and some tender swelling at various of the injection sites, as well as soft tissue swelling and discoloration medial to left knee and upper right shin each ~ 6 cm diameter. Patient does not recall trauma at legs, tho she is morbidly obese and likely trauma to medial left knee with walking. Dr Denman George mentioned oral anticoagulation, which would be consideration if follow up scans do not show active malignancy. Patient is giving injections herself, mentioned topical EMLA. In addition to bruising and SQ hematomas from lovenox, patient is most uncomfortable with right knee. She had injection by PCP shortly prior to finding DVT/ PE, which was helpful for ~ 1 week only. She has not seen orthopedics, but is willing to do that. She denies other new or different pain. She has no nausea, bowels are moving well, no abdominal or pelvic pain, no other bleeding. No problems with PAC. Not more SOB, no chest pain. No increased swelling LE. Peripheral neuropathy may be a little better. She has not begun any exercise. She went to a bridal shower and a barbecue this weekend. Uses oxycodone prn at night if discomfort from hematomas Remainder of 10 point Review of Systems negative/ unchanged.   CA 125 on 02-09-15 was 21.8 PAC by IR 04-11-15 Feraheme  x1 on 06-14-15, not planning second dose but now off oral iron. Genetics counseling 08-28-15 with Comprehensive Cancer Panel by [facility] drawn 08-31-15    ONCOLOGIC HISTORY Patient had intermittent right lower quadrant abdominal pain since Aug 2016, with pelvic US 11-30-14 with uterine fibroids (11.2 x 4.8 x 6.8 cm. 1.7 x 1.7 x 2.0 cm subserosal fibroid in the right uterine body. 6.3 x 3.6 x 5.7 cm probable exophytic/pedunculated fibroid along the anterior uterine fundus), endometrium poorly visualized, right ovary poorly visualized but measured 4 x 2.8 x 4.4 cm and left ovary 4.9 x 3.4 x 4.9 cm with probable corpus luteal cyst called. Note patient is morbidly obese, with BMI >60. She had some fevers during that time. Patient was admitted to [facility] 02-18-15 thru 02-21-15 with pyelonephritis and acute renal failure (creatinine 12.4), both of those problems resolving entirely with treatment. CT AP and MRI pelvis done to evaluate the pyelonephritis demonstrated cystic and solid pelvic mass appearing to be arising from the right adnexa 13 x 9 x 17.5 cm, with a second cystic and solid lesion within the left adnexa measuring 4.4 x 6.5 x 5.3 cm. A third cystic and solid mass was seen anterior to the uterus and superior to the bladder measuring 4.2 x 5.4 x 5.3 cm. She had no pelvic lymphadenopathy, no carcinomatosis, and CA 125 was normal at 21 U/mL. She was seen in consultation by Dr Denman George, with exploratory laparotomy 03-23-15 with TAH BSO, partial small bowel resection and radical debulking, with additional ventral hernia repair. She  was hospitalized with the surgery from 12-6 thru 03-16-15, discharged on prophylactic lovenox which she continues, no post op complications in hospital. Jodell Cipro were removed on 03-27-15, some separation of wound with wound care continuing by patient at home. Surgical pathology [id number]) was reviewed at [facility], with final diagnosis of IIB carcinosarcoma of right ovary with  involvement of left ovary and uterine serosa, and IA grade 1 endometrioid adenocarcinoma of uterus. She saw Dr Denman George 03-29-15, with recommendation for adjuvant chemotherapy and follow up with gyn oncology expected after 6 cycles of chemotherapy. Cycle 1 carbo taxol given 04-12-15, carbo capped at 750 mg and taxol dosed at 175 mg/m2 using actual weight. She was neutropenic with ANCX 0.1 on day 12 cycle 1. Doses same for cycle 2, granix begun day 6, ANC 0.2 on day 8. On pro neulasta used cycle 4, after dose delay and carbo decreased for thrombocytopenia. EMEND was helpful with nausea with cycle 6 on 08-02-15.   Objective:  Vital signs in last 24 hours:  BP 120/90 mmHg  Pulse 89  Temp(Src) 98.5 F (36.9 C) (Oral)  Resp 19  Ht 5' 5.5" (1.664 m)  Wt 345 lb (156.491 kg)  BMI 56.52 kg/m2  SpO2 100%  LMP 10/16/2013 (Approximate) Weight down 1 lb. Respirations not labored at rest. Alert, oriented and appropriate. Ambulatory without assistance .  Alopecia Morbidly obese HEENT:PERRL, sclerae not icteric. Oral mucosa moist without lesions, posterior pharynx clear.  Neck supple. No JVD.  Lymphatics:no supraclavicular adenopathy Resp: clear to auscultation bilaterally and normal percussion bilaterally Cardio: regular rate and rhythm. No gallop. GI: abdomen obese, soft, nontender, cannot appreciate mass or organomegaly. Normally active bowel sounds.  Musculoskeletal/ Extremities: without pitting edema, cords, tenderness. Tender, mildly firm areas in soft tissue up to ~ 6 cm medial to left knee and upper right shin lateraly, seem to be hematomas. Right knee not erythematous or hot, tho difficult to examine otherwise due to morbid obesity.  Neuro: no change peripheral neuropathy. Otherwise nonfocal. PSYCH appropriate mood and affect Skin without rash, , petechiae. Multiple ecchymoses anterior abdomen bilaterally at sites of lovenox injections Portacath-without erythema or tenderness, flushed today  Lab  Results:  Results for orders placed or performed in visit on 09/10/15  CBC with Differential  Result Value Ref Range   WBC 6.8 3.9 - 10.3 10e3/uL   NEUT# 4.7 1.5 - 6.5 10e3/uL   HGB 9.7 (L) 11.6 - 15.9 g/dL   HCT 29.8 (L) 34.8 - 46.6 %   Platelets 209 145 - 400 10e3/uL   MCV 104.0 (H) 79.5 - 101.0 fL   MCH 34.0 25.1 - 34.0 pg   MCHC 32.7 31.5 - 36.0 g/dL   RBC 2.86 (L) 3.70 - 5.45 10e6/uL   RDW 15.2 (H) 11.2 - 14.5 %   lymph# 1.3 0.9 - 3.3 10e3/uL   MONO# 0.6 0.1 - 0.9 10e3/uL   Eosinophils Absolute 0.1 0.0 - 0.5 10e3/uL   Basophils Absolute 0.0 0.0 - 0.1 10e3/uL   NEUT% 68.8 38.4 - 76.8 %   LYMPH% 19.8 14.0 - 49.7 %   MONO% 9.2 0.0 - 14.0 %   EOS% 1.8 0.0 - 7.0 %   BASO% 0.4 0.0 - 2.0 %  Comprehensive metabolic panel  Result Value Ref Range   Sodium 139 136 - 145 mEq/L   Potassium 4.9 3.5 - 5.1 mEq/L   Chloride 107 98 - 109 mEq/L   CO2 24 22 - 29 mEq/L   Glucose 101 70 - 140 mg/dl  BUN 24.5 7.0 - 26.0 mg/dL   Creatinine 1.2 (H) 0.6 - 1.1 mg/dL   Total Bilirubin <0.30 0.20 - 1.20 mg/dL   Alkaline Phosphatase 109 40 - 150 U/L   AST 13 5 - 34 U/L   ALT 15 0 - 55 U/L   Total Protein 7.3 6.4 - 8.3 g/dL   Albumin 3.4 (L) 3.5 - 5.0 g/dL   Calcium 10.1 8.4 - 10.4 mg/dL   Anion Gap 9 3 - 11 mEq/L   EGFR 60 (L) >90 ml/min/1.73 m2  Protime-INR  Result Value Ref Range   Protime 12.0 10.6 - 13.4 Seconds   INR 1.00 (L) 2.00 - 3.50   Lovenox No      Studies/Results: EXAM: CT CHEST, ABDOMEN, AND PELVIS WITH CONTRAST  08-23-15  COMPARISON: Abdomen pelvic CT 04/09/2015  FINDINGS: CT CHEST  Mediastinum/Nodes: Central venous catheter tip terminates in the right atrium. Normal heart size. Small amount of soft tissue within the anterior mediastinum likely residual thymus. Small hiatal hernia. Within the peripheral aspect of the right main pulmonary artery (image 19; series 2) as well as within the right lower lobe pulmonary artery (image 30; series 2) there are bandlike  filling defects identified.  Lungs/Pleura: Central airways are patent. No large area of pulmonary consolidation. No pleural effusion or pneumothorax.  Musculoskeletal: Thoracic spine degenerative changes. No aggressive or acute appearing osseous lesions.  CT ABDOMEN AND PELVIS  Hepatobiliary: Liver is normal in size and contour. No focal lesion is identified. Gallbladder is unremarkable. No intrahepatic or extrahepatic biliary ductal dilatation.  Pancreas: Unremarkable  Spleen: Unremarkable  Adrenals/Urinary Tract: Normal adrenal glands. Kidneys are lobular in contour. No hydronephrosis. Urinary bladder is decompressed.  Stomach/Bowel: No abnormal bowel wall thickening or evidence for bowel obstruction.  Vascular/Lymphatic: Normal caliber abdominal aorta. No retroperitoneal lymphadenopathy.  Other: Nonspecific fat stranding within the pelvis. Within the left hemipelvis there is a 2.5 x 1.6 cm soft tissue nodule (image 100; series 2), near the vaginal cuff. Within the right hemipelvis there is a 1.8 cm soft tissue nodule (image 98; series 2). Small amount of presacral free fluid.  Musculoskeletal: Diastases of the rectus abdominus musculature containing non-obstructed small bowel. Significant interval decrease in size of previously described anterior abdominal wall fluid collection with residual minimal phlegmonous change measuring 3.7 x 2.0 cm. Lumbar spine degenerative changes. No aggressive or acute appearing osseous lesions.  IMPRESSION: There is suggestion of possible filling defects within the right pulmonary artery raising the possibility of pulmonary embolism. Recommend dedicated evaluation of the pulmonary arteries with pulmonary artery CT.  Two soft tissue nodules within the pelvis which are nonspecific and may represent postsurgical change. Recurrent disease is not excluded. Recommend attention on follow-up.  Interval decrease in size and  near complete resolution of previously described anterior abdominal fluid collection.     EXAM: CT ANGIOGRAPHY CHEST WITH CONTRAST  08-23-15 TECHNIQUE: Multidetector CT imaging of the chest was performed using the standard protocol during bolus administration of intravenous contrast. Multiplanar CT image reconstructions and MIPs were obtained to evaluate the vascular anatomy.  CONTRAST: 100 mL Isovue 370 IV  COMPARISON: Earlier scan of the same day  FINDINGS: Vascular: Contrast administered via right IJ power port, tip near the cavoatrial junction. Right atrium and right ventricle are nondilated. RV/LV ratio less than 0.9. There is good contrast opacification of pulmonary artery. Central embolus at the bifurcation of the right pulmonary artery straddling the bifurcation into upper and lower lobe branches. More peripheral segmental emboli  in the right lower lobe branch. No contralateral left pulmonary emboli are identified. Patent bilateral pulmonary veins drain into the left atrium. Adequate contrast opacification of the thoracic aorta with no evidence of dissection, aneurysm, or stenosis. There is classic 3-vessel brachiocephalic arch anatomy without proximal stenosis.  Mediastinum/Lymph Nodes: No masses or pathologically enlarged lymph nodes identified. No pericardial effusion.  Lungs/Pleura: Coarse linear scarring or atelectasis in the anterior right upper lobe. Lungs otherwise clear. No pleural effusion. No pneumothorax.  Upper abdomen: Moderate hiatal hernia. Otherwise negative.  Musculoskeletal: Anterior vertebral endplate spurring at multiple levels in the mid and lower thoracic spine. Sternum intact.  Review of the MIP images confirms the above findings.  IMPRESSION: 1. Positive for acute right pulmonary emboli. No CT evidence of right heart strain. Critical Value/emergent results were called by telephone at the time of interpretation on  08/23/2015 at 11:30 am to Dr. Evlyn Clines , who verbally acknowledged these results. 2. Hiatal hernia.   Medications: I have reviewed the patient's current medications. Needs to add oral iron.  DISCUSSION Areas on LE likely soft tissue bleeding from unrecognized trauma, as with walking with morbid obesity. RN has instructed her on areas to give injections in upper thighs. She is in agreement with continuing lovenox for now, tho we can consider change to oral agent if restaging in August does not show active malignancy.   Discussed need to begin diet and exercise program, as morbid obesity is obviously detrimental from orthopedic standpoint as well as likely detrimental from standpoint of gyn cancer. Unfortunately Kenneth is not doing any classes at present for weight loss. Recommended trying water exercise, as this will be easiest on knees. She cannot be considered for GOG 0225 until we are more certain that she is NED. [facility] SW has given her information on [place] program now, which includes Physiological scientist and access to pools at [employer].   I have spoken with orthopedics, appointment made with Dr Erasmo Score for 09-20-15. That office requests information including Dr Cindra Presume procedure note faxed to [phone], request to [facility] HIM. Appointment information given to patient now.    Assessment/Plan: Assessment/Plan:  1. 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 Pathology reviewed at [facility]. Taxol carboplatin begun 04-12-15, cycle 6 given 08-02-15. Course complicated by severe taxol aches and cytopenias despite gCSF, which have limited dosing, and significantly increased peripheral neuropathy with cycle 6. Restaging CT  small areas in pelvis that may be post surgical, plan per Dr Denman George to repeat CT in Aug prior to return visit to gyn oncology. Genetics testing  sent and pending  Plan to refer for GOG 0225 if NED at restaging in August  2.acute right pulmonary emboli found unexpectedly on restaging scans 08-23-15, in patient with ovarian carcinosarcoma diagnosed 02-2015, surgery 03-23-15, adjuvant chemotherapy thru 08-02-15, morbid obesity, arthritis left knee, sedentary. Subsequently found to have acute DVT right common femoral/ femoral/ popliteal. On bid lovenox due to weight, which is best to continue at least until we are sure no active malignancy still, and hopefully activity increases etc.  3.synchronous IA grade 1 endometrioid adenocarcinoma of uterus, treated surgically 4. acute renal failure with creatinine 12.4 at presentation with pyelonephritis, resolved.  5.Chemo neutropenia during treatment despite neulasta by on pro, fortunately no neutropenic infections. Chemo thrombocytopenia despite dose delays and adjustment in carboplatin, no bleeding. Multifactorial anemia including chemo  anemia.  6.morbid obesity, BMI 55. This obviously impacts tolerance for the anemia and increases risk for the gyn malignancy. Discussed as above .7.Marland Kitchenchemo peripheral neuropathy related to taxol: gabapentin still listed as hs dosing on meds now 7.benign positional vertigo resolved 8.perforated diverticular disease with temporary colostomy 2008.  9 chemo nausea and vomiting improved with addition of EMEND to cycle 6 10. iron deficiency anemia: One dose feraheme 06-14-15, oral iron DCd. f/u colonoscopy information. Chemo anemia 11.history of PCN allergy, which was not an anaphylactic reaction.  12.financial concerns: appreciate assistance from [facility]. She is also being assisted by SW from PCP who has communicated with [facility] SW 13.increased GERD: better on protonix 14.PAC:  flipped 05-10-15, manipulated back into correct position, follow up dye study fine. Peripheral IV access very difficult. Will keep PAC for now, use also for blood draws. 14.degenerative arthritis  symptomatic in knees: only briefly improved with injection by Dr Mingo Amber on 08-21-15. She has not been seen by orthopedics, referral made now. Patient aware that weight loss would be beneficial for knees regardless of other interventions.    All questions answered. Communication with orthopedics new patient scheduler, [place], [place] nutritionist, [facility] HIM. Time spent 25 min including >50% counseling and coordination of care. I will see her 7-6 with labs.    LIVESAY,LENNIS P, MD   09/10/2015, 5:30 PM

## 2015-09-12 ENCOUNTER — Other Ambulatory Visit: Payer: Self-pay | Admitting: Oncology

## 2015-09-12 DIAGNOSIS — Z95828 Presence of other vascular implants and grafts: Secondary | ICD-10-CM

## 2015-09-12 DIAGNOSIS — I82402 Acute embolism and thrombosis of unspecified deep veins of left lower extremity: Secondary | ICD-10-CM | POA: Insufficient documentation

## 2015-09-12 DIAGNOSIS — Z6841 Body Mass Index (BMI) 40.0 and over, adult: Secondary | ICD-10-CM

## 2015-09-12 DIAGNOSIS — I2699 Other pulmonary embolism without acute cor pulmonale: Secondary | ICD-10-CM | POA: Insufficient documentation

## 2015-09-12 DIAGNOSIS — Z86711 Personal history of pulmonary embolism: Secondary | ICD-10-CM | POA: Insufficient documentation

## 2015-09-12 HISTORY — DX: Presence of other vascular implants and grafts: Z95.828

## 2015-09-13 ENCOUNTER — Ambulatory Visit (INDEPENDENT_AMBULATORY_CARE_PROVIDER_SITE_OTHER): Payer: BLUE CROSS/BLUE SHIELD | Admitting: Licensed Clinical Social Worker

## 2015-09-13 ENCOUNTER — Telehealth: Payer: Self-pay | Admitting: Oncology

## 2015-09-13 DIAGNOSIS — F439 Reaction to severe stress, unspecified: Secondary | ICD-10-CM

## 2015-09-13 DIAGNOSIS — Z638 Other specified problems related to primary support group: Secondary | ICD-10-CM

## 2015-09-13 NOTE — Telephone Encounter (Signed)
Faxed the requested medical records from Dr. Marko Plume to Dr. Tonita Cong [phone]

## 2015-09-13 NOTE — Progress Notes (Addendum)
Patient ID: [name], female   DOB: 07/28/1965, 50 y.o.   MRN: [id number]   Reason for follow-up: Continue intervention to address psychosocial stressors. Depression screen [facility] 2/9 09/13/2015 09/06/2015 08/21/2015  Decreased Interest 0 0 0  Down, Depressed, Hopeless 0 0 0  PHQ - 2 Score 0 0 0   PHQ-7 Scores remains 0. Patient is pleasant and engaged. Currently she has personal and family stressors all related to health concerns.  Reports no impairment in her daily functions.  Patient utilizes relaxation techniques when feeling stressed and overwhelmed and has incorporated the CD received from the [place]. Interventions utilized today: Motivational Interviewing, Veterinary surgeon, and Task Centered.  Issues discussed:  Patient's goals were reviewed, no adjustment made.   The following issues were discussed: . Ambivalence with resigning from current job . Medical concerns with a family member . Applying for food stamps . Resources receive from [facility] and attending support groups . Upcoming medical appointments Plan: Patient is in agreement to work on the following prior to next session: Continue relaxed breathing, complete food stamp application, decide on support group to attend, review all options and make a decision about her employer.  Follow up appointment with INT clinic next week.   [name]. [place] Work,  [phone] 2:44 PM

## 2015-09-19 ENCOUNTER — Ambulatory Visit: Payer: BLUE CROSS/BLUE SHIELD | Admitting: Nutrition

## 2015-09-19 ENCOUNTER — Ambulatory Visit: Payer: BLUE CROSS/BLUE SHIELD

## 2015-09-19 MED FILL — PANTOPRAZOLE SOD DR 40 MG T: 40 | 30 days supply | Qty: 30 | Fill #0

## 2015-09-19 MED FILL — oxyCODONE HCL 5 MG TABS: 5 | 2 days supply | Qty: 20 | Fill #0

## 2015-09-19 NOTE — Progress Notes (Signed)
Nutrition follow-up completed with patient status post treatment for ovarian cancer. Weight was documented as 345 pounds on June 5.  BMI equals 56.52 Patient reports fatigue, poor appetite, and early morning nausea. Patient would like more information on healthy food choices and exercise recommendations so she does not gain any weight. [facility] [employer] program and water aerobics.  Nutrition diagnosis: Unintended weight loss resolved. Food and nutrition related knowledge deficit continues.  Intervention: Educated patient on the importance of small frequent plan based meals and snacks with adequate protein.   Recommended slow safe weight loss of no more than 2 pounds weekly to minimize loss of muscle mass. Provided support and encouragement for patient to enroll in a safe exercise program. Questions were answered.  Teach back method used.  Monitoring, evaluation, goals: Patient will tolerate adequate calories and protein to promote safe weight loss.  Next visit: Follow-up has not been scheduled.  Patient will contact me if she develops questions.  **Disclaimer: This note was dictated with voice recognition software. Similar sounding words can inadvertently be transcribed and this note may contain transcription errors which may not have been corrected upon publication of note.**

## 2015-09-20 ENCOUNTER — Encounter: Payer: BLUE CROSS/BLUE SHIELD | Admitting: Nutrition

## 2015-09-21 ENCOUNTER — Telehealth: Payer: Self-pay | Admitting: Genetic Counselor

## 2015-09-21 NOTE — Telephone Encounter (Signed)
Discussed that genetic test results were negative for pathogenic mutations within any of 32 genes on the Comprehensive Cancer Panel (w/ MSH2 exons 1-7 inversion analysis).  Additionally, no uncertain changes were found.  Recommended that [name] continue to follow her doctors' recommendations for future cancer screening.  Women in the close family should make their primary doctors aware of the family history of uterine and ovarian cancer, so that they may continue to receive the most appropriate cancer screening in the future.  Recommended [name] call and update [place] if anyone else in the family is diagnosed with cancer.  She is happy to receive this result.  She knows she is welcome to call if she has any additional questions.

## 2015-09-24 ENCOUNTER — Ambulatory Visit: Payer: Self-pay | Admitting: Genetic Counselor

## 2015-09-24 DIAGNOSIS — Z1379 Encounter for other screening for genetic and chromosomal anomalies: Secondary | ICD-10-CM

## 2015-09-24 DIAGNOSIS — C569 Malignant neoplasm of unspecified ovary: Secondary | ICD-10-CM

## 2015-09-24 DIAGNOSIS — Z809 Family history of malignant neoplasm, unspecified: Secondary | ICD-10-CM

## 2015-09-24 DIAGNOSIS — C541 Malignant neoplasm of endometrium: Secondary | ICD-10-CM

## 2015-09-27 ENCOUNTER — Ambulatory Visit (INDEPENDENT_AMBULATORY_CARE_PROVIDER_SITE_OTHER): Payer: BLUE CROSS/BLUE SHIELD | Admitting: Licensed Clinical Social Worker

## 2015-09-27 DIAGNOSIS — Z638 Other specified problems related to primary support group: Secondary | ICD-10-CM

## 2015-09-27 DIAGNOSIS — F439 Reaction to severe stress, unspecified: Secondary | ICD-10-CM

## 2015-09-27 NOTE — Progress Notes (Signed)
Patient ID: [name], female   DOB: 06-Jun-1965, 50 y.o.   MRN: [id number]   Reason for follow-up:  Continue interventions to address stress at home, coping skills and adjustment to diagnosis. Patient is pleasant and engaged. Her goals were reviewed with no adjustments made.  No symptoms of depression at this time however possible grief related to loss of her job of 17 years and loss of independence.  Intervention utilized today: Motivational Interviewing .The following issues were discussed: . Attending Cancer support group . Financial stressors   . Managing and coping with stressors . Previous and upcoming medical appointments . Education and stages of grief (none death)  . Relaxation techniques . Adjusting to changes  . Discussion of  Bibliotherapy      Plan: Patient is in agreement to continue utilizing relaxation techniques, review the stages of grief information,  read the book "Who Moved My Cheese" and follow up with INT clinic in 2 to [place]. LCSW Clinical Social Work, [place]   [phone] 11:30 AM

## 2015-09-30 ENCOUNTER — Other Ambulatory Visit: Payer: Self-pay | Admitting: Oncology

## 2015-10-01 ENCOUNTER — Telehealth: Payer: Self-pay | Admitting: Oncology

## 2015-10-01 NOTE — Telephone Encounter (Signed)
Cancelled 6/29 per 6/25 & 6/7 pofs

## 2015-10-04 ENCOUNTER — Ambulatory Visit: Payer: BLUE CROSS/BLUE SHIELD | Admitting: Oncology

## 2015-10-04 ENCOUNTER — Other Ambulatory Visit: Payer: BLUE CROSS/BLUE SHIELD

## 2015-10-10 ENCOUNTER — Other Ambulatory Visit: Payer: Self-pay | Admitting: Oncology

## 2015-10-10 ENCOUNTER — Other Ambulatory Visit: Payer: Self-pay

## 2015-10-10 DIAGNOSIS — Z7901 Long term (current) use of anticoagulants: Secondary | ICD-10-CM

## 2015-10-10 DIAGNOSIS — C569 Malignant neoplasm of unspecified ovary: Secondary | ICD-10-CM

## 2015-10-11 ENCOUNTER — Telehealth: Payer: Self-pay | Admitting: Oncology

## 2015-10-11 ENCOUNTER — Encounter: Payer: Self-pay | Admitting: Oncology

## 2015-10-11 ENCOUNTER — Ambulatory Visit (HOSPITAL_BASED_OUTPATIENT_CLINIC_OR_DEPARTMENT_OTHER): Payer: BLUE CROSS/BLUE SHIELD

## 2015-10-11 ENCOUNTER — Other Ambulatory Visit (HOSPITAL_BASED_OUTPATIENT_CLINIC_OR_DEPARTMENT_OTHER): Payer: BLUE CROSS/BLUE SHIELD

## 2015-10-11 ENCOUNTER — Ambulatory Visit (HOSPITAL_BASED_OUTPATIENT_CLINIC_OR_DEPARTMENT_OTHER): Payer: BLUE CROSS/BLUE SHIELD | Admitting: Oncology

## 2015-10-11 VITALS — BP 133/81 | HR 78 | Temp 98.3°F | Resp 18 | Ht 65.5 in | Wt 347.0 lb

## 2015-10-11 DIAGNOSIS — D701 Agranulocytosis secondary to cancer chemotherapy: Secondary | ICD-10-CM | POA: Diagnosis not present

## 2015-10-11 DIAGNOSIS — C541 Malignant neoplasm of endometrium: Secondary | ICD-10-CM

## 2015-10-11 DIAGNOSIS — C562 Malignant neoplasm of left ovary: Secondary | ICD-10-CM

## 2015-10-11 DIAGNOSIS — I2699 Other pulmonary embolism without acute cor pulmonale: Secondary | ICD-10-CM

## 2015-10-11 DIAGNOSIS — C561 Malignant neoplasm of right ovary: Secondary | ICD-10-CM

## 2015-10-11 DIAGNOSIS — D6481 Anemia due to antineoplastic chemotherapy: Secondary | ICD-10-CM

## 2015-10-11 DIAGNOSIS — D649 Anemia, unspecified: Secondary | ICD-10-CM

## 2015-10-11 DIAGNOSIS — D509 Iron deficiency anemia, unspecified: Secondary | ICD-10-CM

## 2015-10-11 DIAGNOSIS — Z95828 Presence of other vascular implants and grafts: Secondary | ICD-10-CM

## 2015-10-11 DIAGNOSIS — G620 Drug-induced polyneuropathy: Secondary | ICD-10-CM

## 2015-10-11 DIAGNOSIS — C569 Malignant neoplasm of unspecified ovary: Secondary | ICD-10-CM

## 2015-10-11 DIAGNOSIS — K219 Gastro-esophageal reflux disease without esophagitis: Secondary | ICD-10-CM

## 2015-10-11 DIAGNOSIS — D6959 Other secondary thrombocytopenia: Secondary | ICD-10-CM

## 2015-10-11 DIAGNOSIS — D500 Iron deficiency anemia secondary to blood loss (chronic): Secondary | ICD-10-CM

## 2015-10-11 DIAGNOSIS — Z6841 Body Mass Index (BMI) 40.0 and over, adult: Secondary | ICD-10-CM

## 2015-10-11 DIAGNOSIS — Z7901 Long term (current) use of anticoagulants: Secondary | ICD-10-CM

## 2015-10-11 LAB — COMPREHENSIVE METABOLIC PANEL
ALBUMIN: 3.4 g/dL — AB (ref 3.5–5.0)
ALK PHOS: 101 U/L (ref 40–150)
ALT: 14 U/L (ref 0–55)
AST: 13 U/L (ref 5–34)
Anion Gap: 9 mEq/L (ref 3–11)
BUN: 26.2 mg/dL — AB (ref 7.0–26.0)
CO2: 25 mEq/L (ref 22–29)
CREATININE: 1.2 mg/dL — AB (ref 0.6–1.1)
Calcium: 10.1 mg/dL (ref 8.4–10.4)
Chloride: 105 mEq/L (ref 98–109)
EGFR: 60 mL/min/{1.73_m2} — ABNORMAL LOW (ref >90)
GLUCOSE: 92 mg/dL (ref 70–140)
Potassium: 4.7 mEq/L (ref 3.5–5.1)
SODIUM: 138 meq/L (ref 136–145)
TOTAL PROTEIN: 7.2 g/dL (ref 6.4–8.3)

## 2015-10-11 LAB — CBC WITH DIFFERENTIAL/PLATELET
BASO%: 0.2 % (ref 0.0–2.0)
Basophils Absolute: 0 10*3/uL (ref 0.0–0.1)
EOS ABS: 0.1 10*3/uL (ref 0.0–0.5)
EOS%: 2.2 % (ref 0.0–7.0)
HCT: 34.5 % — ABNORMAL LOW (ref 34.8–46.6)
HEMOGLOBIN: 11.3 g/dL — AB (ref 11.6–15.9)
LYMPH%: 36.2 % (ref 14.0–49.7)
MCH: 32.7 pg (ref 25.1–34.0)
MCHC: 32.8 g/dL (ref 31.5–36.0)
MCV: 99.7 fL (ref 79.5–101.0)
MONO#: 0.5 10*3/uL (ref 0.1–0.9)
MONO%: 8.4 % (ref 0.0–14.0)
NEUT%: 53 % (ref 38.4–76.8)
NEUTROS ABS: 2.9 10*3/uL (ref 1.5–6.5)
Platelets: 160 10*3/uL (ref 145–400)
RBC: 3.46 10*6/uL — ABNORMAL LOW (ref 3.70–5.45)
RDW: 13 % (ref 11.2–14.5)
WBC: 5.5 10*3/uL (ref 3.9–10.3)
lymph#: 2 10*3/uL (ref 0.9–3.3)

## 2015-10-11 MED ORDER — FERROUS FUMARATE 325 (106 FE) MG PO TABS: ORAL_TABLET | ORAL | Status: DC

## 2015-10-11 MED ORDER — HEPARIN SOD (PORK) LOCK FLUSH 100 UNIT/ML IV SOLN
500.0000 [IU] | Freq: Once | INTRAVENOUS | Status: AC | PRN
  Administered 2015-10-11: 500 [IU] via INTRAVENOUS
  Filled 2015-10-11: qty 5

## 2015-10-11 MED ORDER — LIDOCAINE-PRILOCAINE 2.5-2.5 % EX CREA: TOPICAL_CREAM | CUTANEOUS | Status: DC

## 2015-10-11 MED ORDER — SODIUM CHLORIDE 0.9 % IJ SOLN
10.0000 mL | INTRAMUSCULAR | Status: DC | PRN
  Administered 2015-10-11: 10 mL via INTRAVENOUS
  Filled 2015-10-11: qty 10

## 2015-10-11 NOTE — Progress Notes (Signed)
OFFICE PROGRESS NOTE   October 11, 2015  Everitt Amber, Alveda Reasons, MD (PCP), [facility]:   INTERVAL HISTORY:   Patient is seen, alone for visit, in continuing attention to IIB carcinosarcoma of right ovary, having completed 6 cycles of adjuvant carbo taxol on 08-02-15. It is not clear that she is NED by CTs 08-23-15 , and question of pelvic nodularity at Dr Serita Grit exam after those scans. Plan is for repeat CTs in August prior to return to Dr Denman George then, however she remains on observation for present.  She is on lovenox since PEs found unexpectedly on CTs 08-2015, with RLE DVT also then.   Patient saw Dr Tonita Cong for the right knee pain ~ 09-20-15. I have not received information from that office, however patient tells me that he did injection that has not been helpful, and cannot do surgery for reasons including present anticoagulation. She has follow up at that office.   Otherwise patient seems to be feeling gradually stronger out from chemo. She denies any abdominal or pelvic pain. Bowels are moving fairly regularly. She denies increased LE swelling. She has had no bleeding, does have bruising at sites of lovenox injections as well as inner legs where she has soft tissue trauma from ambulating due to morbid obesity. She denies increased SOB, no chest pain. No problems with PAC. No fever or symptoms of infection. She felt that neurontin 300 mg at hs was "too strong", now back on 100 mg at hs for the residual chemo peripheral neuropathy in feet.  [facility] She would benefit from [place] and exercise study if restaging is negative in 11-2015 (has to begin within 6 mo of completion of adjuvant treatment).  She has gone to sister's pool x1 last week and x1 this week, she and daughter trying this for exercise. I have congratulated her on this effort and encouraged her to continue, told her daily in pool would be ideal. I have mentioned again that several  other patients have had improvement in chemo peripheral neuropathy from water exercise.  Remainder of 10 point Review of Systems negative/ unchanged.    CA 125 on 02-09-15 was 21.8 PAC placed by IR 04-11-15, flushed today. Feraheme x1 on 06-14-15, not planning second dose but now off oral iron. Genetics testing 08-31-15 negative ( Comprehensive Cancer Panel by [facility] )  She appreciates counseling from support staff recently. 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   ONCOLOGIC HISTORY Patient had intermittent right lower quadrant abdominal pain since Aug 2016, with pelvic US 11-30-14 with uterine fibroids (11.2 x 4.8 x 6.8 cm. 1.7 x 1.7 x 2.0 cm subserosal fibroid in the right uterine body. 6.3 x 3.6 x 5.7 cm probable exophytic/pedunculated fibroid along the anterior uterine fundus), endometrium poorly visualized, right ovary poorly visualized but measured 4 x 2.8 x 4.4 cm and left ovary 4.9 x 3.4 x 4.9 cm with probable corpus luteal cyst called. Note patient is morbidly obese, with BMI >60. She had some fevers during that time. Patient was admitted to [facility] 02-18-15 thru 02-21-15 with pyelonephritis and acute renal failure (creatinine 12.4), both of those problems resolving entirely with treatment. CT AP and MRI pelvis done to evaluate the pyelonephritis demonstrated cystic and solid pelvic mass appearing to be arising from the right adnexa 13 x 9 x 17.5 cm, with a second cystic and solid lesion within the left  adnexa measuring 4.4 x 6.5 x 5.3 cm. A third cystic and solid mass was seen anterior to the uterus and superior to the bladder measuring 4.2 x 5.4 x 5.3 cm. She had no pelvic lymphadenopathy, no carcinomatosis, and CA 125 was normal at 21 U/mL. She was seen in consultation by Dr Denman George, with exploratory laparotomy 03-23-15 with TAH BSO, partial small bowel resection and radical debulking, with additional ventral hernia repair. She was hospitalized with  the surgery from 12-6 thru 03-16-15, discharged on prophylactic lovenox which she continues, no post op complications in hospital. Jodell Cipro were removed on 03-27-15, some separation of wound with wound care continuing by patient at home. Surgical pathology [phone]) was reviewed at [facility], with final diagnosis of IIB carcinosarcoma of right ovary with involvement of left ovary and uterine serosa, and IA grade 1 endometrioid adenocarcinoma of uterus.  Cycle 1 carbo taxol given 04-12-15, carbo capped at 750 mg and taxol dosed at 175 mg/m2 using actual weight. She was neutropenic with ANCX 0.1 on day 12 cycle 1. Doses same for cycle 2, granix begun day 6, ANC 0.2 on day 8. On pro neulasta used cycle 4, after dose delay and carbo decreased for thrombocytopenia. EMEND was helpful with nausea with cycle 6 on 08-02-15.  CT CAP 08-23-15 had nodule near vaginal cuff and some stranding in pelvis, also unexpectedly showed pulmonary emboli, with additional RLE DVT documented then. Dr Serita Grit exam after 08-2015 scans had questioned nodularity in pelvis, follow up scans and exam planned 11-2015.   Objective:  Vital signs in last 24 hours:  BP 133/81 mmHg  Pulse 78  Temp(Src) 98.3 F (36.8 C) (Oral)  Resp 18  Ht 5' 5.5" (1.664 m)  Wt 347 lb (157.398 kg)  BMI 56.85 kg/m2  SpO2 100%  LMP 10/16/2013 (Approximate) Weight up 2 lbs. Morbidly obese.  Alert, oriented and appropriate. Ambulatory with effort due to right knee pain and morbid obesity. Respirations not labored RA seated in exam room Hair is growing back.  HEENT:PERRL, sclerae not icteric. Oral mucosa moist without lesions, posterior pharynx clear.  Neck supple. No JVD.  Lymphatics:no cervical,suraclavicular adenopathy Resp: clear to auscultation bilaterally and normal percussion bilaterally Cardio: regular rate and rhythm. No gallop. GI: abdomen obese, soft, nontender, not distended, cannot appreciate mass or organomegaly. Normally active  bowel sounds. Surgical incision not remarkable. Musculoskeletal/ Extremities: lower legs without pitting edema, cords, tenderness Neuro: peripheral neuropathy feet stable. Otherwise nonfocal. PSYCH appropriate mood and affect Skin Small resolving ecchymoses at sites of lovenox injections anterior abdomen, some with SQ hematomas palpable but nothing excessive. Bruising inner left knee consistent with tissue trauma from ambulation and morbid obesity. Otherwise without rash, petechiae Portacath-without erythema or tenderness  Lab Results:  Results for orders placed or performed in visit on 10/11/15  CBC with Differential  Result Value Ref Range   WBC 5.5 3.9 - 10.3 10e3/uL   NEUT# 2.9 1.5 - 6.5 10e3/uL   HGB 11.3 (L) 11.6 - 15.9 g/dL   HCT 34.5 (L) 34.8 - 46.6 %   Platelets 160 145 - 400 10e3/uL   MCV 99.7 79.5 - 101.0 fL   MCH 32.7 25.1 - 34.0 pg   MCHC 32.8 31.5 - 36.0 g/dL   RBC 3.46 (L) 3.70 - 5.45 10e6/uL   RDW 13.0 11.2 - 14.5 %   lymph# 2.0 0.9 - 3.3 10e3/uL   MONO# 0.5 0.1 - 0.9 10e3/uL   Eosinophils Absolute 0.1 0.0 - 0.5 10e3/uL   Basophils Absolute  0.0 0.0 - 0.1 10e3/uL   NEUT% 53.0 38.4 - 76.8 %   LYMPH% 36.2 14.0 - 49.7 %   MONO% 8.4 0.0 - 14.0 %   EOS% 2.2 0.0 - 7.0 %   BASO% 0.2 0.0 - 2.0 %  Comprehensive metabolic panel  Result Value Ref Range   Sodium 138 136 - 145 mEq/L   Potassium 4.7 3.5 - 5.1 mEq/L   Chloride 105 98 - 109 mEq/L   CO2 25 22 - 29 mEq/L   Glucose 92 70 - 140 mg/dl   BUN 26.2 (H) 7.0 - 26.0 mg/dL   Creatinine 1.2 (H) 0.6 - 1.1 mg/dL   Total Bilirubin <0.30 0.20 - 1.20 mg/dL   Alkaline Phosphatase 101 40 - 150 U/L   AST 13 5 - 34 U/L   ALT 14 0 - 55 U/L   Total Protein 7.2 6.4 - 8.3 g/dL   Albumin 3.4 (L) 3.5 - 5.0 g/dL   Calcium 10.1 8.4 - 10.4 mg/dL   Anion Gap 9 3 - 11 mEq/L   EGFR 60 (L) >90 ml/min/1.73 m2    Hemoglobin up from 9.7 on 09-10-15 Studies/Results:  No results found.  Medications: I have reviewed the patient's current  medications.  She dislikes lovenox injections (bid due to weight), however cannot have xarelto due to weight (avoid with BMI > 20) and prefers not to have INR checks with coumadin.   DISCUSSION Clinicallly nothing that seems obviously more concerning for active gyn malignancy now. Patient understands plan for repeat scans and follow up with Dr Denman George.  Exercise and weight loss efforts encouraged.  NOTE GOG 0225 referral should be made if NED by reevaluation in 11-2015.   She agrees to continue lovenox for now. If restaging scans show no cancer and if she becomes significantly more mobile/ active, could consider DC anticoagulation after ~ 4-6 months  NOTE PAC should be flushed with CT 11-21-15, then every 6-8 weeks from that date if not otherwise used.   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 Pathology reviewed at [facility]. Taxol carboplatin begun 04-12-15, cycle 6 given 08-02-15. Course complicated by severe taxol aches and cytopenias despite gCSF, which have limited dosing, and significantly increased peripheral neuropathy with cycle 6. Restaging CT small areas in pelvis that may be post surgical, plan per Dr Denman George to repeat CT in Aug prior to return visit to gyn oncology. I will see her back coordinating with PAC flush in ~ Oct Genetics testing negative 08-2015 (Comprehensive Cancer Panel by [facility]) Plan to refer for GOG 0225 if NED at restaging in August  2.acute right pulmonary emboli found unexpectedly on restaging scans 08-23-15, in patient with ovarian carcinosarcoma diagnosed 02-2015, surgery 03-23-15, adjuvant chemotherapy thru 08-02-15, morbid obesity, arthritis knee, sedentary. Subsequently found to have acute DVT right common femoral/ femoral/ popliteal. On bid lovenox due to weight, which is best to continue at least until we are sure  no active malignancy, and hopefully activity increases etc, at least 4-6 mo.  3.synchronous IA grade 1 endometrioid adenocarcinoma of uterus, treated surgically 4. acute renal failure with creatinine 12.4 at presentation with pyelonephritis, resolved.  5.Chemo neutropenia during treatment despite neulasta by on pro, fortunately no neutropenic infections. Chemo thrombocytopenia despite dose delays and adjustment in carboplatin, no bleeding. Multifactorial anemia including chemo anemia improving. 6.morbid obesity, BMI 57. Needs weight  loss for multiple indications, encouraged as above.  Marland Kitchen7..chemo peripheral neuropathy related to taxol: gabapentin still listed as hs dosing on meds now 7.benign positional vertigo resolved 8.perforated diverticular disease with temporary colostomy 2008.  9 chemo nausea and vomiting improved with addition of EMEND to cycle 6 10. iron deficiency anemia: One dose feraheme 06-14-15, oral iron DCd. f/u colonoscopy information. Chemo anemia 11.history of PCN allergy, which was not an anaphylactic reaction.  12.financial concerns: appreciate assistance from [facility]. She is also being assisted by SW from PCP who has communicated with [facility] SW 13.increased GERD: better on protonix 14.PAC: flipped 05-10-15, manipulated back into correct position, follow up dye study fine. Peripheral IV access very difficult. Keep flushed every 6-8 weeks, next expected with CT on 11-21-15. 14.degenerative arthritis symptomatic in knees: appreciate help from Dr Tonita Cong 15.chemo peripheral neuropathy: low dose gabapentin at hs (did not tolerate increased dose), water exercise.  All questions answered. TIme spent 25 min including >50% counseling and coordination of care. Route PCP, cc Dr Lang Snow, MD   10/11/2015, 4:23 PM

## 2015-10-11 NOTE — Telephone Encounter (Signed)
Gave and printed appt sched and avs fo rpt for OCT °

## 2015-10-11 NOTE — Patient Instructions (Signed)

## 2015-10-18 ENCOUNTER — Ambulatory Visit (INDEPENDENT_AMBULATORY_CARE_PROVIDER_SITE_OTHER): Payer: BLUE CROSS/BLUE SHIELD | Admitting: Licensed Clinical Social Worker

## 2015-10-18 DIAGNOSIS — Z638 Other specified problems related to primary support group: Secondary | ICD-10-CM

## 2015-10-18 DIAGNOSIS — F439 Reaction to severe stress, unspecified: Secondary | ICD-10-CM

## 2015-10-18 NOTE — Progress Notes (Signed)
Patient ID: [name], female   DOB: 1965/12/06, 50 y.o.   MRN: [id number]  Reason for follow-up: continue intervention to address psychosocial stressors and adjustments to medical condition. PHQ-7 Scores remains 0. Patient continues to express concerns with managing multi stressors, adjusting to not being able to work, and limited interaction with others during the day due to family and friends working.  Patient was unable to get the recommended reading from the Library, however she still wants to read the book. Interventions utilized today: Motivational Interviewing and psychoeducation. Patient's goals were reviewed, no adjustment made.   The following was discussed today: . symptoms of panic attack . review stages of grief from last visit . Poor sleep  . Managing stressors . Relaxation techniques  . Daily planner . Behavioral Activation (within her limits) Provided patient with educational material on panic attack, flight/fight response, check list for better sleep, coping with stress, a daily planner sheet, and a book to read on change.  Plan: Patient is in agreement to work on the following: Continue relaxation techniques, read all material provided and complete work sheets, attend Cancer support groups this month and follow up with INT clinic within the next week.   [name], LCSW Licensed Clinical Social Worker [place]   [phone] 10:08 AM

## 2015-10-24 ENCOUNTER — Other Ambulatory Visit: Payer: Self-pay

## 2015-10-24 DIAGNOSIS — I2699 Other pulmonary embolism without acute cor pulmonale: Secondary | ICD-10-CM

## 2015-10-24 MED ORDER — ENOXAPARIN SODIUM 150 MG/ML ~~LOC~~ SOLN: 150.0000 mg | Freq: Two times a day (BID) | SUBCUTANEOUS | Status: DC

## 2015-10-24 MED FILL — ENOXAPARIN 150 MG/ML SYR: 150 | 30 days supply | Qty: 60 | Fill #0

## 2015-10-24 MED FILL — PANTOPRAZOLE SOD DR 40 MG T: 40 | 30 days supply | Qty: 30 | Fill #1

## 2015-10-24 MED FILL — LIDOCAINE-PRILOCAINE CREAM: 2.5-2.5 | 10 days supply | Qty: 30 | Fill #0

## 2015-10-24 MED FILL — FERROCITE TABLET: 324 | 30 days supply | Qty: 30 | Fill #0

## 2015-10-24 NOTE — Telephone Encounter (Signed)
[name] requested to have Lovenox prescription sent to [place] as it is easier to get medicatin at [place].

## 2015-10-25 ENCOUNTER — Ambulatory Visit (INDEPENDENT_AMBULATORY_CARE_PROVIDER_SITE_OTHER): Payer: BLUE CROSS/BLUE SHIELD | Admitting: Licensed Clinical Social Worker

## 2015-10-25 DIAGNOSIS — F439 Reaction to severe stress, unspecified: Secondary | ICD-10-CM

## 2015-10-25 DIAGNOSIS — Z638 Other specified problems related to primary support group: Secondary | ICD-10-CM

## 2015-10-25 NOTE — Progress Notes (Signed)
Patient ID: [name], female   DOB: July 16, 1965, 50 y.o.   MRN: [id number]   Reason for follow-up: interventions to manage stressors and adjustment to medical condition. GAD 7 score is 1 at this time ( no indication of anxiety), patient denies depression and presents with no symptoms.   GAD 7 : Generalized Anxiety Score 10/25/2015  Nervous, Anxious, on Edge 0  Control/stop worrying 0  Worry too much - different things 1  Trouble relaxing 0  Restless 0  Easily annoyed or irritable 0  Afraid - awful might happen 0  Total GAD 7 Score 1  Anxiety Difficulty Not difficult at all  Patient was pleasant, positive and upbeat.  She completed the reading assignment and utilized the daily planning sheet. Per patient, both interventions worked well for her as this was the best week she has had.  State it was evident due to no longer looking at her limitations, spirits now lifted, thinking positive and processing  future goals and options.  Intervention utilized today was Field seismologist, and Book-therapy. Patient's goals were reviewed, no adjustment made.   The following was discussed today: . Utilization of Daily planner . Behavioral Activation (within her limits) . Education on Pacing activities  . Process of change/ transition . Most important message from her reading ("When you stop being afraid, you feel good") Plan: Current interventions are working well.  Will continue with Behavioral Activation, additional copies of daily planner provided, educational material on process of transition and a new book "[place]". Patient is in agreement to, read provided material, and follow up with INT clinic within the next week.    [name], LCSW Licensed Clinical Social Worker [place]   [phone] 10:19 AM

## 2015-10-29 ENCOUNTER — Encounter: Payer: Self-pay | Admitting: Genetic Counselor

## 2015-10-29 DIAGNOSIS — Z1379 Encounter for other screening for genetic and chromosomal anomalies: Secondary | ICD-10-CM | POA: Insufficient documentation

## 2015-10-29 NOTE — Progress Notes (Signed)
GENETIC TEST RESULT  HPI: [name] was previously seen in the [place] clinic due to a personal history of ovarian and endometrial cancers, family history of pancreatic, prostate, and other cancer, and concerns regarding a hereditary predisposition to cancer. Please refer to our prior cancer genetics clinic note from Aug 28, 2015 for more information regarding [name]'s medical, social and family histories, and our assessment and recommendations, at the time. [name] recent genetic test results were disclosed to her, as were recommendations warranted by these results. These results and recommendations are discussed in more detail below.  GENETIC TEST RESULTS: At the time of [name]'s visit on Aug 28, 2015, we recommended she pursue genetic testing of the 32-gene Comprehensive Cancer Panel with MSH2 Exons 1-7 Inversion Analysis through [employer]. The Comprehensive Cancer Panel offered by [facility] includes sequencing and/or deletion duplication testing of the following 32 genes: APC, ATM, AXIN2, BARD1, BMPR1A, BRCA1, BRCA2, BRIP1, CDH1, CDK4, CDKN2A, CHEK2, EPCAM, FANCC, MLH1, MSH2, MSH6, MUTYH, NBN, PALB2, PMS2, POLD1, POLE, PTEN, RAD51C, RAD51D, SCG5/GREM1, SMAD4, STK11, TP53, VHL, and XRCC2. Those results are now back, the report date for which is September 19, 2015.  Genetic testing was normal, and did not reveal a deleterious mutation in these genes.  Additionally, no variants of uncertain significance (VUSes) were found.  The test report will be scanned into EPIC and will be located under the Results Review tab in the Pathology>Molecular Pathology section.   We discussed with [name] that since the current genetic testing is not perfect, it is possible there may be a gene mutation in one of these genes that current testing cannot detect, but that chance is small. We also discussed, that it is possible that another gene that has not yet been  discovered, or that we have not yet tested, is responsible for the cancer diagnoses in the family, and it is, therefore, important to remain in touch with cancer genetics in the future so that we can continue to offer [name] the most up-to-date genetic testing.   CANCER SCREENING RECOMMENDATIONS: We still do not have an explanation for the personal and family history of cancer.  This result may be reassuring and indicate that [name] likely does not have an increased risk for a future cancer due to a mutation in one of these genes. This normal test also suggests that [name]'s cancer was most likely not due to an inherited predisposition associated with one of these genes.  Most cancers happen by chance and this negative test suggests that her cancer falls into this category.  Additionally, many of [name]'s family members have lived to later ages and have not had cancer.  We, therefore, recommended she continue to follow the cancer management and screening guidelines provided by her oncology and primary healthcare providers.   RECOMMENDATIONS FOR FAMILY MEMBERS: Women in this family might be at some increased risk of developing cancer, over the general population risk, simply due to the family history of cancer. We recommended women in this family have a yearly mammogram beginning at age 27, or 63 years younger than the earliest onset of cancer, an annual clinical breast exam, and perform monthly breast self-exams. Women in this family should also have a gynecological exam as recommended by their primary provider. [name]'s daughter, nieces, and sisters should make their primary doctors aware of [name]'s history of uterine and ovarian cancer, so that they may receive the most appropriate medical management/surgical options  in the future.  All family members should have a colonoscopy by age 27.  FOLLOW-UP: Lastly, we discussed with [name] that cancer  genetics is a rapidly advancing field and it is possible that new genetic tests will be appropriate for her and/or her family members in the future. We encouraged her to remain in contact with cancer genetics on an annual basis so we can update her personal and family histories and let her know of advances in cancer genetics that may benefit this family.   Our contact number was provided. [name] questions were answered to her satisfaction, and she knows she is welcome to call us at anytime with additional questions or concerns.   Alison Luz, MS, [facility] Certified Genetic Counselor [name] Phone: [phone]

## 2015-11-01 ENCOUNTER — Ambulatory Visit (INDEPENDENT_AMBULATORY_CARE_PROVIDER_SITE_OTHER): Payer: BLUE CROSS/BLUE SHIELD | Admitting: Licensed Clinical Social Worker

## 2015-11-01 DIAGNOSIS — F439 Reaction to severe stress, unspecified: Secondary | ICD-10-CM

## 2015-11-01 DIAGNOSIS — Z638 Other specified problems related to primary support group: Secondary | ICD-10-CM

## 2015-11-01 NOTE — Progress Notes (Signed)
Patient ID: [name], female   DOB: 03-12-1966, 50 y.o.   MRN: [id number]  Reason for follow-up: continue interventions to assist with managing stressors and adjustment to medical condition. Depression screen [facility] 2/9 11/01/2015 10/18/2015 09/27/2015  Decreased Interest 0 0 0  Down, Depressed, Hopeless 0 0 0  PHQ - 2 Score 0 0 0  Patient was pleasant and positive. Continues to show no depression based on PHQ-2 scale and patient's statement.  Patient started reading the 2nd book assignment and states she is able to relate the book to her situation.  Per patient is now managing her stressors better, accepting her new normal, based on her current situation and is managing her time better with the daily planning sheet. On a scale of 1 to 10 patient states she is an 8, indicated she was a 1 when she started coming to therapy in June.  Intervention utilized today was Field seismologist with Book-therapy. Patient's goals were reviewed, she continues to make great progress with finding a new normal, Identify community support, Healthy weight management and Identifying her limitations.  The following was discussed today: . Education continued on Pacing . Acceptance of the changes she is experiencing  . Managing stressors  . Processed first three chapter in book . Discussed resources for income and food  . Discussed most important message from her reading (" The path of  the valley appears when you choose to see things differently") Plan: Current interventions are working well.  Will continue with Behavioral Activation, daily planner and complete 2nd book "[place]". Patient is in agreement with the above, and will follow up with INT clinic in two weeks.    [name], LCSW Licensed Clinical Social Worker [place]   [phone] 10:08 AM

## 2015-11-21 ENCOUNTER — Encounter (HOSPITAL_COMMUNITY): Payer: Self-pay

## 2015-11-21 ENCOUNTER — Ambulatory Visit (HOSPITAL_COMMUNITY)
Admission: RE | Admit: 2015-11-21 | Discharge: 2015-11-21 | Disposition: A | Discharge status: Home / Self Care | Payer: BLUE CROSS/BLUE SHIELD | Source: Ambulatory Visit | Attending: Gynecologic Oncology | Admitting: Gynecologic Oncology

## 2015-11-21 DIAGNOSIS — K439 Ventral hernia without obstruction or gangrene: Secondary | ICD-10-CM | POA: Diagnosis not present

## 2015-11-21 DIAGNOSIS — C569 Malignant neoplasm of unspecified ovary: Secondary | ICD-10-CM | POA: Diagnosis not present

## 2015-11-21 DIAGNOSIS — R1900 Intra-abdominal and pelvic swelling, mass and lump, unspecified site: Secondary | ICD-10-CM | POA: Diagnosis not present

## 2015-11-21 DIAGNOSIS — K449 Diaphragmatic hernia without obstruction or gangrene: Secondary | ICD-10-CM | POA: Insufficient documentation

## 2015-11-21 MED ORDER — IOPAMIDOL (ISOVUE-300) INJECTION 61%
100.0000 mL | Freq: Once | INTRAVENOUS | Status: AC | PRN
  Administered 2015-11-21: 100 mL via INTRAVENOUS

## 2015-11-26 ENCOUNTER — Ambulatory Visit: Payer: BLUE CROSS/BLUE SHIELD | Attending: Gynecologic Oncology | Admitting: Gynecologic Oncology

## 2015-11-26 ENCOUNTER — Other Ambulatory Visit (HOSPITAL_BASED_OUTPATIENT_CLINIC_OR_DEPARTMENT_OTHER): Payer: BLUE CROSS/BLUE SHIELD

## 2015-11-26 ENCOUNTER — Encounter: Payer: Self-pay | Admitting: Gynecologic Oncology

## 2015-11-26 VITALS — BP 129/54 | HR 62 | Temp 97.5°F | Resp 18 | Ht 65.5 in | Wt 363.0 lb

## 2015-11-26 DIAGNOSIS — C561 Malignant neoplasm of right ovary: Secondary | ICD-10-CM | POA: Diagnosis not present

## 2015-11-26 DIAGNOSIS — Z833 Family history of diabetes mellitus: Secondary | ICD-10-CM | POA: Diagnosis not present

## 2015-11-26 DIAGNOSIS — C569 Malignant neoplasm of unspecified ovary: Secondary | ICD-10-CM

## 2015-11-26 DIAGNOSIS — Z841 Family history of disorders of kidney and ureter: Secondary | ICD-10-CM | POA: Diagnosis not present

## 2015-11-26 DIAGNOSIS — Z6841 Body Mass Index (BMI) 40.0 and over, adult: Secondary | ICD-10-CM | POA: Insufficient documentation

## 2015-11-26 DIAGNOSIS — Z807 Family history of other malignant neoplasms of lymphoid, hematopoietic and related tissues: Secondary | ICD-10-CM | POA: Insufficient documentation

## 2015-11-26 DIAGNOSIS — K219 Gastro-esophageal reflux disease without esophagitis: Secondary | ICD-10-CM | POA: Diagnosis not present

## 2015-11-26 DIAGNOSIS — Z9221 Personal history of antineoplastic chemotherapy: Secondary | ICD-10-CM | POA: Insufficient documentation

## 2015-11-26 DIAGNOSIS — Z86711 Personal history of pulmonary embolism: Secondary | ICD-10-CM | POA: Diagnosis not present

## 2015-11-26 DIAGNOSIS — I10 Essential (primary) hypertension: Secondary | ICD-10-CM | POA: Insufficient documentation

## 2015-11-26 DIAGNOSIS — Z8543 Personal history of malignant neoplasm of ovary: Secondary | ICD-10-CM | POA: Insufficient documentation

## 2015-11-26 DIAGNOSIS — Z90710 Acquired absence of both cervix and uterus: Secondary | ICD-10-CM | POA: Insufficient documentation

## 2015-11-26 DIAGNOSIS — Z90722 Acquired absence of ovaries, bilateral: Secondary | ICD-10-CM | POA: Diagnosis not present

## 2015-11-26 DIAGNOSIS — C541 Malignant neoplasm of endometrium: Secondary | ICD-10-CM

## 2015-11-26 DIAGNOSIS — Z8249 Family history of ischemic heart disease and other diseases of the circulatory system: Secondary | ICD-10-CM | POA: Insufficient documentation

## 2015-11-26 DIAGNOSIS — Z880 Allergy status to penicillin: Secondary | ICD-10-CM | POA: Diagnosis not present

## 2015-11-26 DIAGNOSIS — N179 Acute kidney failure, unspecified: Secondary | ICD-10-CM | POA: Insufficient documentation

## 2015-11-26 DIAGNOSIS — K589 Irritable bowel syndrome without diarrhea: Secondary | ICD-10-CM | POA: Insufficient documentation

## 2015-11-26 DIAGNOSIS — Z823 Family history of stroke: Secondary | ICD-10-CM | POA: Insufficient documentation

## 2015-11-26 DIAGNOSIS — Z8371 Family history of colonic polyps: Secondary | ICD-10-CM | POA: Insufficient documentation

## 2015-11-26 NOTE — Patient Instructions (Signed)
We will contact you with the results of your CA 125 from today.  Plan to follow up with Dr. Marko Plume as previously scheduled and Dr. Denman George in Jan 2018 or sooner if needed.  Plan to have a CA 125 drawn at each visit.  We do not have our schedule for Jan 2018 at this time but when you come in to see Dr. Marko Plume in October, we can get you scheduled for Jan.  Please call for any questions or concerns.

## 2015-11-26 NOTE — Progress Notes (Signed)
Followup of Consult: Gyn-Onc  CC:  Chief Complaint  Patient presents with  . carcinosarcoma    follow up    Assessment/Plan:  [name]  is a 50 y.o.  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  She is s/p exploratory laparotomy and TAH, BSO, optimal cytoreduction to no residual visible disease on 03/13/15. S/p 6 cycles of adjuvant carboplatin and paclitaxel chemotherapy.  New diagnosis of PE in May, 2017 - on lovenox.  Complete response to treatment. Decrease in equivocal pelvic nodularity on imaging from May 2017 CT - no evidence of recurrence/progression. CA 125 normal in March, 2017 - recheck today  HPI: [name] is a very pleasant 50 year old woman with history of 2 prior cesarean sections who is seen initially in the hospital as an inpatient consultation at [facility] on 02/19/2015 for complex pelvic masses. The patient is a morbidly obese woman with a BMI 62 kg/m. She was admitted to [facility] on 02/18/2015 with pyelonephritis. CT imaging and then MRI imaging was performed to evaluate for an etiology for pyelonephritis. This identified A large pelvic mass which it to be arising from the right adnexa. It measured 13 x 9 x 17.5 cm and was irregular cystic and solid in nature. A second, but cystic and solid lesion within the left adnexa measuring 4.4 x 6.5 x 5.3 cm. In the third cystic and solid mass was seen anterior to the uterus and superior to the bladder measuring 4.2 x 5.4 x 5.3 cm. She had no pelvic lymphadenopathy, no carcinomatosis, and CA 125 was normal at 21U/mL.  Her infection was adequately treated with IV antibiotics and her acute kidney injury which manifested with an elevated creatinine 12.4 spontaneously resolved during her hospital stay with avoidance of NSAID and good hydration. She was noted to be anemic during her hospitalization with a hemoglobin of 10.8. However this did not require  transfusion and spontaneously improved over time.   On 03/13/2015 she underwent an exploratory laparotomy TAH/BSO and radical tumor debulking for a stage IIIc carcinosarcoma of the right ovary and an incidentally found synchronous stage I a endometrial cancer. Tumor was found with a large right ovarian mass, smaller left ovarian mass and a carcinosarcoma implant was involving the anterior cul-de-sac between the bladder and lower uterine segment. All disease sites were completely resected with no macroscopic disease Rain[place] at the completion of debulking.   She went on to receive 6 cycles of adjuvant chemotherapy with carboplatin and paclitaxel between 04/12/15 and 08/02/15. She tolerated treatment well with some neutropenia. CA 125 was normal at 4.1 at completion of therapy in March, 2017. On 08/23/15 she underwent post-treatment CT imaging which showed: there is a 2.5 x 1.6 cm soft tissue nodule near the vaginal cuff. Within the right hemipelvis there is a 1.8 cm soft tissue nodule.   Interval History:  Repeat CT on 11/21/15 showed that the small bilateral deep pelvic side wall soft tissue masses have decreased in size and are less suggestive of persistent/recurrent disease.  She has a stable small left ventral hernia.  Her knees have been causing pain and she is having injections on these. This limits her ability to exercise.  She has been doing water aerobics.  Current Meds:  Outpatient Encounter Prescriptions as of 11/26/2015  Medication Sig  . acetaminophen (TYLENOL) 500 MG tablet Take 1,000 mg by mouth every 6 (six) hours as needed for mild pain, moderate pain, fever or headache.  Reported on 09/10/2015  . clobetasol cream (TEMOVATE) 0.05 % Apply topically 2 (two) times daily. (Patient taking differently: Apply 1 application topically every other day. )  . enoxaparin (LOVENOX) 150 MG/ML injection Inject 1 mL (150 mg total) into the skin every 12 (twelve) hours.  . ferrous fumarate (HEMOCYTE - 106 MG FE)  325 (106 Fe) MG TABS tablet Take 1 tabletpo daily  on an empty stomach with OJ  . gabapentin (NEURONTIN) 100 MG capsule Take 100 mg by mouth at bedtime.   . lidocaine-prilocaine (EMLA) cream Apply to PAC site 1-2 hours prior to access as needed.  . pantoprazole (PROTONIX) 40 MG tablet Take 1 tablet (40 mg total) by mouth daily.  Marland Kitchen docusate sodium (COLACE) 100 MG capsule Take 100 mg by mouth daily as needed for mild constipation. Reported on 09/10/2015  . loratadine (CLARITIN) 10 MG tablet Take 10 mg by mouth daily as needed for allergies. Reported on 09/10/2015  . LORazepam (ATIVAN) 1 MG tablet Place 1/2 to 1 tablet under the tongue or swallow every 6 hours as needed for nausea. Will make drowsy. (Patient not taking: Reported on 08/31/2015)  . meclizine (ANTIVERT) 25 MG tablet Take 1 tablet (25 mg total) by mouth 3 (three) times daily as needed for dizziness. (Patient not taking: Reported on 11/26/2015)  . oxyCODONE (OXY IR/ROXICODONE) 5 MG immediate release tablet Take 1-2 tablets (5-10 mg total) by mouth every 4 (four) hours as needed for severe pain. (Patient not taking: Reported on 11/26/2015)  . polyethylene glycol (MIRALAX / GLYCOLAX) packet Take 17 g by mouth 2 (two) times daily as needed for mild constipation. Reported on 10/11/2015   Facility-Administered Encounter Medications as of 11/26/2015  Medication  . sodium chloride flush (NS) 0.9 % injection 10 mL  . sodium chloride flush (NS) 0.9 % injection 10 mL    Allergy:  Allergies  Allergen Reactions  . Penicillins Rash    Has patient had a PCN reaction causing immediate rash, facial/tongue/throat swelling, SOB or lightheadedness with hypotension: no Has patient had a PCN reaction causing severe rash involving mucus membranes or skin necrosis: no Has patient had a PCN reaction that required hospitalization in the hospital at the time Has patient had a PCN reaction occurring within the last 10 years: no If all of the above answers are "NO",  then may proceed with Cephalosporin use.     Social Hx:   Social History   Social History  . Marital status: Legally Separated    Spouse name: N/A  . Number of children: N/A  . Years of education: N/A   Occupational History  . Not on file.   Social History Main Topics  . Smoking status: Never Smoker  . Smokeless tobacco: Never Used  . Alcohol use 0.0 oz/week     Comment: Maybe wine every 6 months  . Drug use: No  . Sexual activity: Yes     Comment: Told she could not have more children in 1986   Other Topics Concern  . 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   Married x 23 years, recently separated (4/15).  No domestic violence.  + financial stress.     Lives previously with husband who moved out, now with daughter who has medical problems, including Hodgkin's disease, and granddaughter (age 61).     Uses seat  belt.     Past Surgical Hx:  Past Surgical History:  Procedure Laterality Date  . ABDOMINAL HYSTERECTOMY N/A 03/13/2015   Procedure: TOTAL HYSTERECTOMY ABDOMINAL BILATERAL SALPINGO OOPHORECTOMY RADICAL TUMOR [place];  Surgeon: Everitt Amber, MD;  Location: [facility];  Service: Gynecology;  Laterality: N/A;  . BOWEL RESECTION  03/13/2015   Procedure: SMALL BOWEL RESECTION;  Surgeon: Everitt Amber, MD;  Location: [facility];  Service: Gynecology;;  . [place] and 1984  . COLOSTOMY  2008  . COLOSTOMY TAKEDOWN    . LAPAROTOMY N/A 03/13/2015   Procedure: EXPLORATORY LAPAROTOMY;  Surgeon: Everitt Amber, MD;  Location: [facility];  Service: Gynecology;  Laterality: N/A;  . TONSILLECTOMY AND ADENOIDECTOMY  1997  . VENTRAL HERNIA REPAIR  03/13/2015   Procedure: HERNIA REPAIR VENTRAL ADULT;  Surgeon: Everitt Amber, MD;  Location: [facility];  Service: Gynecology;;    Past Medical Hx:  Past Medical History:  Diagnosis Date  . Anemia   . Cough   .  Diverticulitis with perforation 2010  . Endometrial cancer ([place])   . Family history of adverse reaction to anesthesia    pts daughter had difficulty awakening following anesthesia   . GERD (gastroesophageal reflux disease)   . Headache   . History of bronchitis   . History of ear infections   . Hx of migraines   . Hypertension   . IBS (irritable bowel syndrome)   . Kidney infection   . Morbid obesity ([place])   . Ovarian cancer ([place]) dx'd 01/2015  . Pyelonephritis     Past Gynecological History:  C/s x 2  Patient's last menstrual period was 10/16/2013 (approximate).  Family Hx:  Family History  Problem Relation Age of Onset  . Diabetes Mother   . Hypertension Mother   . Hyperlipidemia Mother   . Colon polyps Mother     4 total  . Fibroids Mother     s/p hysterectomy  . Hyperlipidemia Father   . Diabetes Daughter   . Hodgkin's lymphoma Daughter 18  . Asthma Maternal Aunt     severe - d. 52  . Prostate cancer Maternal Uncle 2  . Diabetes Paternal Aunt   . Diabetes Paternal Uncle   . Kidney failure Maternal Grandmother 84  . Pancreatic cancer Paternal Grandmother     dx. >50  . Diabetes Paternal Grandfather   . Diabetes Paternal Aunt   . Pancreatic cancer Cousin 41    paternal 1st cousin  . Heart disease Neg Hx   . Stroke Neg Hx     Review of Systems:  Constitutional  Feels well,    ENT Normal appearing ears and nares bilaterally Skin/Breast  No rash, sores, jaundice, itching, dryness Cardiovascular  No chest pain, shortness of breath, or edema  Pulmonary  No cough or wheeze.  Gastro Intestinal  No nausea, vomitting, or diarrhoea. No bright red blood per rectum, nochange in bowel movement, or constipation.  Genito Urinary  No frequency, urgency, dysuria,  Musculo Skeletal  + bilateral knee pain with ambulation Neurologic  No weakness, numbness, change in gait,  Psychology  No depression, anxiety, insomnia.   Vitals:  Blood pressure (!) 129/54, pulse  62, temperature 97.5 F (36.4 C), temperature source Oral, resp. rate 18, height 5' 5.5" (1.664 m), weight (!) 363 lb (164.7 kg), last menstrual period 10/16/2013, SpO2 100 %.  Physical Exam: WD in NAD Neck  Supple NROM, without any enlargements.  Lymph Node Survey No cervical supraclavicular or inguinal adenopathy  Cardiovascular  Pulse normal rate, regularity and rhythm. S1 and S2 normal.  Lungs  Clear to auscultation bilateraly, without wheezes/crackles/rhonchi. Good air movement.  Skin  No rash/lesions/breakdown  Psychiatry  Alert and oriented to person, place, and time  Abdomen  Normoactive bowel sounds, abdomen soft, non-tender and obese without palpable evidence of hernia. There is a large midline incision which is healed. Back No CVA tenderness Genito Urinary  Vulva/vagina: Normal external female genitalia.  No lesions. No discharge or bleeding.  Bladder/urethra:  No lesions or masses, well supported bladder  Vagina: cuff in tact.  Cervix and uterus: surgically absent  Adnexa: no palpable masses. Rectal  Good tone, no masses no cul de sac nodularity.  Extremities  No bilateral cyanosis, clubbing or edema.   Donaciano Eva, MD  11/26/2015, 10:25 AM

## 2015-11-27 ENCOUNTER — Telehealth: Payer: Self-pay

## 2015-11-27 LAB — CA 125: CANCER ANTIGEN (CA) 125: 3.1 U/mL (ref 0.0–38.1)

## 2015-11-27 NOTE — Telephone Encounter (Signed)
Orders received from [facility] to contact the patient to update with CA 125 level being normal at 3.1 range is ( 0.0 - 38.1 ) >  Patient contacted and updated , patient states understanding , patient denies further questions at this time.

## 2015-11-28 ENCOUNTER — Encounter: Payer: Self-pay | Admitting: Oncology

## 2015-11-28 NOTE — Progress Notes (Signed)
Medical Oncology  Dr Serita Grit note 8-21 and CT information 11-21-15 reviewed, appears NED. Email sent to [place] to refer to [place].  Message to RN as patient continues lovenox for PE/DVT 08-2015. Could consider change to coumadin, may be able to coordinate that with Research visits if so.   Godfrey Pick, MD

## 2015-11-29 ENCOUNTER — Ambulatory Visit: Payer: BLUE CROSS/BLUE SHIELD

## 2015-12-12 MED FILL — ENOXAPARIN 150 MG/ML SYR: 150 | 30 days supply | Qty: 60 | Fill #1

## 2015-12-12 MED FILL — PANTOPRAZOLE SOD DR 40 MG T: 40 | 30 days supply | Qty: 30 | Fill #2

## 2015-12-25 ENCOUNTER — Encounter: Payer: Self-pay | Admitting: General Practice

## 2015-12-25 NOTE — Progress Notes (Signed)
[facility] Spiritual Care Note  [name] by phone to offer f/u spiritual support.  She used opportunity well to share and process her struggles with post-tx adjustment, plus other health challenges (esp knee pain/mobility) and inability to return to work.  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  Assisted with pastoral reflection, helping her name and explore themes of grief (at loss of old normal), isolation, change in identity/sense of self, and desire for return of independence.  She identifies gratitude and family support as two strengths that are helping her cope and make meaning in the midst of a longer adjustment process than she expected.  [name] verbalized how much she appreciated the call and now feels comfortable to reach out for more support.  She plans to call to make a Spiritual Care appt when she is ready, but please also page if needs arise/circumstances change.  Thank you.   [place], [place], [facility] Pager [phone] Voicemail [phone]

## 2016-01-10 ENCOUNTER — Ambulatory Visit (INDEPENDENT_AMBULATORY_CARE_PROVIDER_SITE_OTHER): Payer: BLUE CROSS/BLUE SHIELD | Admitting: Licensed Clinical Social Worker

## 2016-01-10 DIAGNOSIS — F439 Reaction to severe stress, unspecified: Secondary | ICD-10-CM

## 2016-01-10 NOTE — Progress Notes (Signed)
Patient ID: [name], female   DOB: 24-Dec-1965, 50 y.o.   MRN: [id number]  Reason for follow-up: Brief intervention to address symptoms associated with managing stressors.  Patient presents positive and upbeat.  States she is doing well and believe that she has made great progress with her goals. However, reports trouble sleeping, this is causing some difficulty in her daily functions.  PHQ is up from last visit. Patient has an appointment to see her PCP in Nov to address concerns with sleep. Depression screen [facility] 2/9 01/10/2016 11/01/2015 10/18/2015  Decreased Interest 0 0 0  Down, Depressed, Hopeless 0 0 0  PHQ - 2 Score 0 0 0  Altered sleeping 3 - -  Tired, decreased energy 0 - -  Change in appetite 0 - -  Feeling bad or failure about yourself  1 - -  Trouble concentrating 0 - -  Moving slowly or fidgety/restless 0 - -  Suicidal thoughts 0 - -  PHQ-9 Score 4 - -  The following was discussed: how to continue balancing and managing family stressors with self-care, continue Behavior Activation, resources for water aerobics, review of [place] in her life, and community financial resources.  Interventions utilized: Motivational Interviewing, Solution Focus, Reflective listening, Relaxation and Meditation techniques.        Plan: Patient is in agreement to continue to identify activities for behavioral activation, look into financial resources provided, continue with self-care and implement relaxation techniques reviewed.  Patient will call for follow up appointment or phone consultation within [facility] in the next two to three weeks.   [name], LCSW Licensed Clinical Social Worker [place]   [phone] 10:03 AM

## 2016-01-16 ENCOUNTER — Other Ambulatory Visit: Payer: Self-pay | Admitting: Oncology

## 2016-01-16 DIAGNOSIS — C561 Malignant neoplasm of right ovary: Secondary | ICD-10-CM

## 2016-01-17 ENCOUNTER — Encounter: Payer: Self-pay | Admitting: Oncology

## 2016-01-17 ENCOUNTER — Other Ambulatory Visit (HOSPITAL_BASED_OUTPATIENT_CLINIC_OR_DEPARTMENT_OTHER): Payer: BLUE CROSS/BLUE SHIELD

## 2016-01-17 ENCOUNTER — Ambulatory Visit (HOSPITAL_BASED_OUTPATIENT_CLINIC_OR_DEPARTMENT_OTHER): Payer: BLUE CROSS/BLUE SHIELD | Admitting: Oncology

## 2016-01-17 ENCOUNTER — Ambulatory Visit: Payer: BLUE CROSS/BLUE SHIELD

## 2016-01-17 VITALS — BP 141/70 | HR 70 | Temp 97.6°F | Resp 18 | Ht 65.5 in | Wt 369.0 lb

## 2016-01-17 DIAGNOSIS — D6481 Anemia due to antineoplastic chemotherapy: Secondary | ICD-10-CM | POA: Diagnosis not present

## 2016-01-17 DIAGNOSIS — G620 Drug-induced polyneuropathy: Secondary | ICD-10-CM

## 2016-01-17 DIAGNOSIS — D509 Iron deficiency anemia, unspecified: Secondary | ICD-10-CM

## 2016-01-17 DIAGNOSIS — Z23 Encounter for immunization: Secondary | ICD-10-CM | POA: Diagnosis not present

## 2016-01-17 DIAGNOSIS — Z95828 Presence of other vascular implants and grafts: Secondary | ICD-10-CM

## 2016-01-17 DIAGNOSIS — Z6841 Body Mass Index (BMI) 40.0 and over, adult: Secondary | ICD-10-CM

## 2016-01-17 DIAGNOSIS — C541 Malignant neoplasm of endometrium: Secondary | ICD-10-CM | POA: Diagnosis not present

## 2016-01-17 DIAGNOSIS — I2699 Other pulmonary embolism without acute cor pulmonale: Secondary | ICD-10-CM

## 2016-01-17 DIAGNOSIS — K219 Gastro-esophageal reflux disease without esophagitis: Secondary | ICD-10-CM

## 2016-01-17 DIAGNOSIS — C562 Malignant neoplasm of left ovary: Secondary | ICD-10-CM

## 2016-01-17 DIAGNOSIS — C561 Malignant neoplasm of right ovary: Secondary | ICD-10-CM | POA: Diagnosis not present

## 2016-01-17 DIAGNOSIS — T451X5A Adverse effect of antineoplastic and immunosuppressive drugs, initial encounter: Secondary | ICD-10-CM

## 2016-01-17 DIAGNOSIS — C569 Malignant neoplasm of unspecified ovary: Secondary | ICD-10-CM

## 2016-01-17 DIAGNOSIS — Z7901 Long term (current) use of anticoagulants: Secondary | ICD-10-CM

## 2016-01-17 LAB — CBC WITH DIFFERENTIAL/PLATELET
BASO%: 0.2 % (ref 0.0–2.0)
Basophils Absolute: 0 10*3/uL (ref 0.0–0.1)
EOS%: 2.5 % (ref 0.0–7.0)
Eosinophils Absolute: 0.1 10*3/uL (ref 0.0–0.5)
HCT: 35.2 % (ref 34.8–46.6)
HGB: 11.6 g/dL (ref 11.6–15.9)
LYMPH%: 33 % (ref 14.0–49.7)
MCH: 30.7 pg (ref 25.1–34.0)
MCHC: 33 g/dL (ref 31.5–36.0)
MCV: 93.1 fL (ref 79.5–101.0)
MONO#: 0.5 10*3/uL (ref 0.1–0.9)
MONO%: 8.6 % (ref 0.0–14.0)
NEUT%: 55.7 % (ref 38.4–76.8)
NEUTROS ABS: 3.2 10*3/uL (ref 1.5–6.5)
Platelets: 156 10*3/uL (ref 145–400)
RBC: 3.78 10*6/uL (ref 3.70–5.45)
RDW: 14 % (ref 11.2–14.5)
WBC: 5.7 10*3/uL (ref 3.9–10.3)
lymph#: 1.9 10*3/uL (ref 0.9–3.3)

## 2016-01-17 LAB — COMPREHENSIVE METABOLIC PANEL
ALT: 9 U/L (ref 0–55)
AST: 12 U/L (ref 5–34)
Albumin: 3.5 g/dL (ref 3.5–5.0)
Alkaline Phosphatase: 83 U/L (ref 40–150)
Anion Gap: 8 mEq/L (ref 3–11)
BILIRUBIN TOTAL: 0.33 mg/dL (ref 0.20–1.20)
BUN: 28.7 mg/dL — ABNORMAL HIGH (ref 7.0–26.0)
CO2: 22 meq/L (ref 22–29)
Calcium: 10 mg/dL (ref 8.4–10.4)
Chloride: 110 mEq/L — ABNORMAL HIGH (ref 98–109)
Creatinine: 1.1 mg/dL (ref 0.6–1.1)
EGFR: 70 mL/min/{1.73_m2} — AB (ref >90)
GLUCOSE: 103 mg/dL (ref 70–140)
Potassium: 4.8 mEq/L (ref 3.5–5.1)
SODIUM: 140 meq/L (ref 136–145)
TOTAL PROTEIN: 6.8 g/dL (ref 6.4–8.3)

## 2016-01-17 MED ORDER — HEPARIN SOD (PORK) LOCK FLUSH 100 UNIT/ML IV SOLN
500.0000 [IU] | Freq: Once | INTRAVENOUS | Status: AC | PRN
  Administered 2016-01-17: 500 [IU] via INTRAVENOUS
  Filled 2016-01-17: qty 5

## 2016-01-17 MED ORDER — SODIUM CHLORIDE 0.9 % IJ SOLN
10.0000 mL | INTRAMUSCULAR | Status: DC | PRN
  Administered 2016-01-17: 10 mL via INTRAVENOUS
  Filled 2016-01-17: qty 10

## 2016-01-17 MED ORDER — INFLUENZA VAC SPLIT QUAD 0.5 ML IM SUSY
0.5000 mL | PREFILLED_SYRINGE | Freq: Once | INTRAMUSCULAR | Status: AC
  Administered 2016-01-17: 0.5 mL via INTRAMUSCULAR
  Filled 2016-01-17: qty 0.5

## 2016-01-17 NOTE — Progress Notes (Signed)
OFFICE PROGRESS NOTE   January 19, 2016   Physicians: Everitt Amber, Alveda Reasons, MD (PCP), J.Beane  INTERVAL HISTORY:  Patient is seen, alone for visit, in follow up of IIB carcinosarcoma of right ovary, on observation since completing 6 cycles of adjuvant carbo taxol on 08-02-15. She had follow up CT AP 11-21-15 prior to seeing Dr Denman George on 11-25-15, all stable to improved compared with 08-2015, with no known active disease now.   She was not eligible for GOG 0225 diet and exercise study due to carcinosarcoma diagnosis.  She continues lovenox for PEs found incidentally on CT 08-2015 + RLE DVT. Weight too high for xarelto/ eliquis so continues bid lovenox. As long as no active disease and as long as she is more physically active daily, could consider DC lovenox after 6 months.   Patient had injections x 3 right knee in 12-2015 by Dr Tonita Cong, with good improvement in right knee, tho left knee now is more bothersome. She went to [facility] for water exercise classes regularly for several weeks , this very helpful for the peripheral neuropathy in feet as well as  improvement in sleep at night and subjective weight loss. Unfortunately she cannot affort the $5-8 per class at [facility] now. Center support staff is helping with application for Y; pool at her sister's is closed for winter. I have sent message to [facility] support staff as there may be a church in [place] with pool availability. She has started walking with daughter in [employer] 2x weekly using cart for support.  She has felt better emotionally since beginning to exercise, particularly with improvement in taxol peripheral neuropathy with the water exercise. She denies abdominal or pelvic pain. No bleeding, no increased swelling LE. No increased SOB or other respiratory symptoms. No problems with PAC. Bladder ok. No new or different pain other than left knee. No fever or symptoms of infection Remainder of 10 point Review of Systems  negative.     CA 125 on 02-09-15 was 21.8 PAC placed by IR 04-11-15, flushed 01-17-16 Feraheme x1 on 06-14-15, not planning second dose but now off oral iron. Genetics testing 08-31-15 negative ( Comprehensive Cancer Panel by [facility] ) Lovenox as above Flu vaccine 01-17-16  She wants to return to work when physically able, still very deconditioned related to cancer treatment, morbid obesity, peripheral neuropathy and degenerative joint disease.   ONCOLOGIC HISTORY Patient had intermittent right lower quadrant abdominal pain since Aug 2016, with pelvic US 11-30-14 with uterine fibroids (11.2 x 4.8 x 6.8 cm. 1.7 x 1.7 x 2.0 cm subserosal fibroid in the right uterine body. 6.3 x 3.6 x 5.7 cm probable exophytic/pedunculated fibroid along the anterior uterine fundus), endometrium poorly visualized, right ovary poorly visualized but measured 4 x 2.8 x 4.4 cm and left ovary 4.9 x 3.4 x 4.9 cm with probable corpus luteal cyst called. Note patient is morbidly obese, with BMI >60. She had some fevers during that time. Patient was admitted to [facility] 02-18-15 thru 02-21-15 with pyelonephritis and acute renal failure (creatinine 12.4), both of those problems resolving entirely with treatment. CT AP and MRI pelvis done to evaluate the pyelonephritis demonstrated cystic and solid pelvic mass appearing to be arising from the right adnexa 13 x 9 x 17.5 cm, with a second cystic and solid lesion within the left adnexa measuring 4.4 x 6.5 x 5.3 cm. A third cystic and solid mass was seen anterior to the uterus and superior to the bladder measuring  4.2 x 5.4 x 5.3 cm. She had no pelvic lymphadenopathy, no carcinomatosis, and CA 125 was normal at 21 U/mL. She was seen in consultation by Dr Denman George, with exploratory laparotomy 03-23-15 with TAH BSO, partial small bowel resection and radical debulking, with additional ventral hernia repair. She was hospitalized with the surgery from 12-6 thru 03-16-15, discharged on prophylactic  lovenox which she continues, no post op complications in hospital. Jodell Cipro were removed on 03-27-15, some separation of wound with wound care continuing by patient at home. Surgical pathology [phone]) was reviewed at [facility], with final diagnosis of IIB carcinosarcoma of right ovary with involvement of left ovary and uterine serosa, and IA grade 1 endometrioid adenocarcinoma of uterus.  Cycle 1 carbo taxol given 04-12-15, carbo capped at 750 mg and taxol dosed at 175 mg/m2 using actual weight. She was neutropenic with ANCX 0.1 on day 12 cycle 1. Doses same for cycle 2, granix begun day 6, ANC 0.2 on day 8. On pro neulasta used cycle 4, after dose delay and carbo decreased for thrombocytopenia. EMEND was helpful with nausea with cycle 6 on 08-02-15.  CT CAP 08-23-15 had nodule near vaginal cuff and some stranding in pelvis, also unexpectedly showed pulmonary emboli, with additional RLE DVT documented then. Dr Serita Grit exam after 08-2015 scans had questioned nodularity in pelvis, follow up CT AP and gyn onc exam 11-2015 no apparent residual or recurrent disease.   Objective:  Vital signs in last 24 hours:  BP (!) 141/70 (BP Location: Right Arm, Patient Position: Bed low/side rails up)   Pulse 70   Temp 97.6 F (36.4 C) (Oral)   Resp 18   Ht 5' 5.5" (1.664 m)   Wt (!) 369 lb (167.4 kg)   LMP 10/16/2013 (Approximate)   SpO2 100%   BMI 60.47 kg/m  Weight up 22 lbs from 10-2015 Alert, oriented and appropriate. Ambulatory slowly, able to get on and off exam table without assistance No alopecia  HEENT:PERRL, sclerae not icteric. Oral mucosa moist without lesions, posterior pharynx clear.  Neck supple. No JVD.  Lymphatics:no cervical,supraclavicular, axillary or appreciable inguinal adenopathy Resp: clear to auscultation bilaterally and normal percussion bilaterally Cardio: regular rate and rhythm. No gallop. GI: abdomen obese, soft, nontender, not obviously distended, cannot appreciate  mass or organomegaly. Normally active bowel sounds. Surgical incision not remarkable. SQ nodules at sites of lovenox injections Musculoskeletal/ Extremities: LE without pitting edema, cords, tenderness. No apparent swelling and no erythema knees Neuro: peripheral neuropathy feet. Otherwise nonfocal. PSYCH brighter affect Skin  Ecchymoses anterior abdomen from lovenox injection, otherwise without rash, petechiae or other ecchymoses Portacath-without erythema or tenderness  Lab Results:  Results for orders placed or performed in visit on 01/17/16  CBC with Differential  Result Value Ref Range   WBC 5.7 3.9 - 10.3 10e3/uL   NEUT# 3.2 1.5 - 6.5 10e3/uL   HGB 11.6 11.6 - 15.9 g/dL   HCT 35.2 34.8 - 46.6 %   Platelets 156 145 - 400 10e3/uL   MCV 93.1 79.5 - 101.0 fL   MCH 30.7 25.1 - 34.0 pg   MCHC 33.0 31.5 - 36.0 g/dL   RBC 3.78 3.70 - 5.45 10e6/uL   RDW 14.0 11.2 - 14.5 %   lymph# 1.9 0.9 - 3.3 10e3/uL   MONO# 0.5 0.1 - 0.9 10e3/uL   Eosinophils Absolute 0.1 0.0 - 0.5 10e3/uL   Basophils Absolute 0.0 0.0 - 0.1 10e3/uL   NEUT% 55.7 38.4 - 76.8 %   LYMPH% 33.0  14.0 - 49.7 %   MONO% 8.6 0.0 - 14.0 %   EOS% 2.5 0.0 - 7.0 %   BASO% 0.2 0.0 - 2.0 %  Comprehensive metabolic panel  Result Value Ref Range   Sodium 140 136 - 145 mEq/L   Potassium 4.8 3.5 - 5.1 mEq/L   Chloride 110 (H) 98 - 109 mEq/L   CO2 22 22 - 29 mEq/L   Glucose 103 70 - 140 mg/dl   BUN 28.7 (H) 7.0 - 26.0 mg/dL   Creatinine 1.1 0.6 - 1.1 mg/dL   Total Bilirubin 0.33 0.20 - 1.20 mg/dL   Alkaline Phosphatase 83 40 - 150 U/L   AST 12 5 - 34 U/L   ALT 9 0 - 55 U/L   Total Protein 6.8 6.4 - 8.3 g/dL   Albumin 3.5 3.5 - 5.0 g/dL   Calcium 10.0 8.4 - 10.4 mg/dL   Anion Gap 8 3 - 11 mEq/L   EGFR 70 (L) >90 ml/min/1.73 m2    CA 125 available after visit 3.1  Studies/Results: EXAM: CT ABDOMEN AND PELVIS WITH CONTRAST  TECHNIQUE: Multidetector CT imaging of the abdomen and pelvis was performed using the standard  protocol following bolus administration of intravenous contrast.  CONTRAST:  166m ISOVUE-300 IOPAMIDOL (ISOVUE-300) INJECTION 61%  COMPARISON:  08/23/2015 CT abdomen/pelvis.  FINDINGS: Lower chest: No significant pulmonary nodules or acute consolidative airspace disease. The tip of a superior approach central venous catheter is seen at the cavoatrial junction.  Hepatobiliary: Normal liver with no liver mass. Normal gallbladder with no radiopaque cholelithiasis. No biliary ductal dilatation.  Pancreas: Normal, with no mass or duct dilation.  Spleen: Normal size. No mass.  Adrenals/Urinary Tract: Normal adrenals. Stable asymmetrically small left kidney. No hydronephrosis. No renal mass. Normal bladder.  Stomach/Bowel: Small to moderate hiatal hernia. Otherwise grossly normal stomach. Normal caliber small bowel with no small bowel wall thickening. Appendix not discretely visualized. Mild-to-moderate stool throughout the colon. No large bowel wall thickening or pericolonic fat stranding.  Vascular/Lymphatic: Normal caliber abdominal aorta. Patent portal, splenic, hepatic and renal veins. No pathologically enlarged lymph nodes in the abdomen or pelvis.  Reproductive: Status posthysterectomy. Left deep pelvic sidewall 1.5 x 1.1 cm soft tissue mass (series 2/ image 77), decreased from 2.5 x 1.6 cm. Right deep pelvic side walls 1.3 x 0.8 cm soft tissue mass (series 7/ image 75), decreased from 1.8 x 1.0 cm. No new pelvic mass or fluid collection.  Other: Status post omentectomy. Stable small fat containing left ventral abdominal wall hernia. Stable 0.5 cm nodule in the anterior left upper abdominal fat (series 2/ image 25). No new nodularity in the abdomen. No pneumoperitoneum or ascites. Stable 3.4 x 1.7 cm fluid collection in the deep subcutaneous soft tissues in the midline lower laparotomy site.  Musculoskeletal: No aggressive appearing focal osseous  lesions. Moderate thoracolumbar spondylosis.  IMPRESSION: 1. No new or progressive metastatic disease. 2. Small bilateral deep pelvic side wall soft tissue masses have decreased in size. 3. Subcentimeter nodule in the anterior left upper abdominal fat is stable. 4. Additional findings include small to moderate hiatal hernia, stable small fat containing left ventral abdominal wall hernia and stable small deep subcutaneous fluid collection in the midline lower laparotomy site.  PACs images reviewed by MD  Medications: I have reviewed the patient's current medications. With NED by most recent scans and gyn onc evaluation, consider DC lovenox after 6 months = late Nov as long as she is up and active daily  then.  Flu vaccine given today  DISCUSSION Encouraged water exercise and increasing activity otherwise. She will contact Dr Tonita Cong re left knee symptoms. Appreciate assistance from [facility] support staff including with Y.  Considerations for lovenox DC in Nov as above. Message to RNs to speak with her in mid to late Nov as above.  Clinically nothing that seems of concern for recurrent disease, tho obviously aggressive pathology. Appointment scheduled now with Dr Denman George for 04-28-16. Keep PAC for now, extremely difficult peripheral IV access, needs flush every 6-8 weeks when not otherwise used.  Patient aware that another med onc MD will follow beginning 2018.    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 Pathology reviewed at [facility]. Taxol carboplatin begun 04-12-15, cycle 6 given 08-02-15. Course complicated by severe taxol aches and cytopenias despite gCSF, which have limited dosing, and significantly increased peripheral neuropathy feet with cycle 6. Follow up CT AP 11-24-15 stable to improved with no apparent residual or recurrent disease.  She will see Dr Denman George next 04-2016. Genetics testing negative 08-2015 (Comprehensive Cancer Panel by [facility]) Was not eligible for GOG 0225 despite NED at restaging in August, due to carcinosarcoma diagnosis  2.acute right pulmonary emboli found unexpectedly on restaging scans 08-23-15 and extensive acute DVT RLE. Predisposing conditions for clotting: ovarian carcinosarcoma , surgery 03-23-15, adjuvant chemotherapy thru 08-02-15, morbid obesity, arthritis knees, sedentary. On bid lovenox due to weight. If activity continues to improve and otherwise no known active malignancy, would be ok to DC lovenox after 6 months = mid to late 02-2016. RN to follow up and discuss with MD then. 3.synchronous IA grade 1 endometrioid adenocarcinoma of uterus, treated surgically 4. acute renal failure with creatinine 12.4 at presentation with pyelonephritis, resolved.  5.Chemo neutropenia during treatment despite neulasta by on pro, fortunately no neutropenic infections. Chemo thrombocytopenia despite dose delays and adjustment in carboplatin, no bleeding. Multifactorial anemia including chemo anemia improving. 6.morbid obesity, BMI now 60. Needs weight loss for multiple indications, encouraged as above. Exercise including in pool will be helpful .7.chemo peripheral neuropathy related to taxol: gabapentin still listed as hs. Much better in feet with water exercise classes recently, [facility] support staff involved to locate programs. 7.benign positional vertigo resolved 8.perforated diverticular disease with temporary colostomy 2008.  9 chemo nausea and vomiting improved with addition of EMEND to cycle 6 10. iron deficiency anemia/ chemo anemia: hgb in normal range today. One dose feraheme 06-14-15, oral iron DCd. f/u colonoscopy information. 11.history of PCN allergy, which was not an anaphylactic reaction.  12.financial concerns: appreciate assistance from [facility]. She is also being assisted by SW from PCP who has communicated  with [facility] 13.increased GERD: better on protonix 14.PAC: flipped 05-10-15, manipulated back into correct position, follow up dye study fine. Peripheral IV access very difficult. Keep flushed every 6-8 weeks.  14.degenerative arthritis symptomatic in knees: appreciate help from Dr Tonita Cong 15.flu vaccine 01-17-16  All questions answered. Time spent 25 min including >50% counseling and coordination of care. Message to collaborative RNs. Route PCP, cc Dr Tonita Cong, Dr Leone Payor, MD   01/19/2016, 10:45 AM

## 2016-01-20 ENCOUNTER — Other Ambulatory Visit: Payer: Self-pay

## 2016-01-20 DIAGNOSIS — I82401 Acute embolism and thrombosis of unspecified deep veins of right lower extremity: Secondary | ICD-10-CM

## 2016-01-20 MED ORDER — ENOXAPARIN SODIUM 150 MG/ML ~~LOC~~ SOLN: 150.0000 mg | Freq: Two times a day (BID) | SUBCUTANEOUS | 2 refills | Status: DC

## 2016-01-21 MED FILL — ENOXAPARIN 150 MG/ML SYR: 150 | 30 days supply | Qty: 60 | Fill #0

## 2016-01-22 ENCOUNTER — Encounter: Payer: Self-pay | Admitting: Family Medicine

## 2016-01-22 ENCOUNTER — Ambulatory Visit (INDEPENDENT_AMBULATORY_CARE_PROVIDER_SITE_OTHER): Payer: BLUE CROSS/BLUE SHIELD | Admitting: Family Medicine

## 2016-01-22 DIAGNOSIS — L309 Dermatitis, unspecified: Secondary | ICD-10-CM | POA: Diagnosis not present

## 2016-01-22 MED ORDER — CETIRIZINE HCL 10 MG PO TABS: 10.0000 mg | ORAL_TABLET | Freq: Every day | ORAL | 0 refills | Status: DC | PRN

## 2016-01-22 MED ORDER — PREDNISONE 20 MG PO TABS: 20.0000 mg | ORAL_TABLET | Freq: Every day | ORAL | 0 refills | Status: DC

## 2016-01-22 MED ORDER — TRIAMCINOLONE ACETONIDE 0.1 % EX CREA: 1.0000 "application " | TOPICAL_CREAM | Freq: Two times a day (BID) | CUTANEOUS | 0 refills | Status: DC | PRN

## 2016-01-22 MED FILL — TRIAMCINOLONE 0.1% CREAM: 0.1 | 20 days supply | Qty: 30 | Fill #0

## 2016-01-22 MED FILL — predniSONE 20 MG TABS: 20 | 5 days supply | Qty: 5 | Fill #0

## 2016-01-22 NOTE — Progress Notes (Signed)
Date of Visit: 01/22/2016   HPI:  This is my first time meeting her, as she recently was reassigned to me due to changes in Dr. Cindra Presume availability.   Chart review: -History of ovarian CA - followed by oncology Dr. Marko Plume. S/p adjuvant chemotherapy finished 4/27. Most recent scans wth no known active disease. -History of DVT/PE incidentally noted on CT abdomen in May 2017. Has continued on lovenox since then. Per most recent onc notes, may consider discontinuing anticoagulation in November after 6 months of therapy. Per patient, the plan is to stop it in November.  Issues for today: Rash - present on stomach and back since Wednesday of last week. Tried topical cortisone,w ithout much relief. Recently changed her laundry detergent and washed some bedding, this immediately preceded the rash. No sores in mouth/genitals. No fevers. No new medications or foods.   Eczema on face - reports that she uses clobetasol for eczema on her face. Has used this for some time. Does not use a regular moisturizer on face.   Patient notes history of knee arthritis, followed by orthopedics Dr. Tonita Cong. Gets filler injections regularly there.  ROS: See HPI.  [place]: history of DVT/PE, ovarian carcinosarcoma, morbid obesity, hypertension, chemotherapy induced peripheral neuropathy, CKD3, constipation  PHYSICAL EXAM: BP (!) 144/63   Pulse 79   Temp 97.7 F (36.5 C) (Oral)   Ht 5' 5.5" (1.664 m)   Wt (!) 376 lb 6.4 oz (170.7 kg)   LMP 10/16/2013 (Approximate)   BMI 61.68 kg/m  Gen: NAD, pleasant, cooeprative HEENT: normocephalic, atraumatic, moist mucous membranes, no oral lesions Skin: scattered erythematous maculopapular rash on chest, stomach, some on back  ASSESSMENT/PLAN:  Health maintenance:  -given handout on colonoscopy & mammogram scheduling  Rash - appears to be due to recent change in detergent. Primary issue is itching. Recommend trial of zyrtec 10mg  daily. Given printed rx for prednisone  burst in case antihistamines are insufficient for itch control. Patient has already changed back to her prior laundry detergent, which should help. Follow up if not improving.  Eczema Advised that clobetasol is a very strong steroid, and should not be used on the face Recommended good hydration/moisturizing regimen with aveeno products rx triamcinolone cream (more mild than clobetasol) Follow up as needed if symptoms worsen or fail to improve.    FOLLOW UP: Follow up in 1-2 mos for chronic medical issues  [place] J. [name], [place]

## 2016-01-22 NOTE — Patient Instructions (Addendum)
It was nice to meet you today!  For rash: -start zyrtec 10mg  daily -hold on to the prednisone prescription. If zyrtec doesn't help, can take prednisone for 5 days  For face eczema: -find a good moisturizer for your face that is targeted towards people with senstiive skin or eczema. Good brands are aveeno, eucerin, or aquaphor -use triamcinolone cream as needed twice daily - use caution as it can cause thinning and lightening of the skin  See handouts for scheduling mammogram and colonoscopy Need office visit with GI to discuss colonoscopy first  Follow up with me in 1-2 months for your chronic medical issues, so that we can keep getting to know eachother.  Be well, Dr. [name]

## 2016-01-27 DIAGNOSIS — L309 Dermatitis, unspecified: Secondary | ICD-10-CM | POA: Insufficient documentation

## 2016-01-27 NOTE — Assessment & Plan Note (Signed)
Advised that clobetasol is a very strong steroid, and should not be used on the face Recommended good hydration/moisturizing regimen with aveeno products rx triamcinolone cream (more mild than clobetasol) Follow up as needed if symptoms worsen or fail to improve.

## 2016-02-01 ENCOUNTER — Other Ambulatory Visit: Payer: Self-pay | Admitting: Oncology

## 2016-02-01 DIAGNOSIS — C561 Malignant neoplasm of right ovary: Secondary | ICD-10-CM

## 2016-02-01 MED FILL — PANTOPRAZOLE SOD DR 40 MG T: 40 | 30 days supply | Qty: 30 | Fill #0

## 2016-03-06 ENCOUNTER — Telehealth: Payer: Self-pay

## 2016-03-06 ENCOUNTER — Ambulatory Visit (HOSPITAL_BASED_OUTPATIENT_CLINIC_OR_DEPARTMENT_OTHER): Payer: BLUE CROSS/BLUE SHIELD

## 2016-03-06 VITALS — BP 133/77 | HR 57 | Temp 98.0°F | Resp 18

## 2016-03-06 DIAGNOSIS — C569 Malignant neoplasm of unspecified ovary: Secondary | ICD-10-CM

## 2016-03-06 DIAGNOSIS — C561 Malignant neoplasm of right ovary: Secondary | ICD-10-CM | POA: Diagnosis not present

## 2016-03-06 DIAGNOSIS — Z452 Encounter for adjustment and management of vascular access device: Secondary | ICD-10-CM

## 2016-03-06 DIAGNOSIS — C541 Malignant neoplasm of endometrium: Secondary | ICD-10-CM | POA: Diagnosis not present

## 2016-03-06 DIAGNOSIS — C562 Malignant neoplasm of left ovary: Secondary | ICD-10-CM | POA: Diagnosis not present

## 2016-03-06 DIAGNOSIS — Z95828 Presence of other vascular implants and grafts: Secondary | ICD-10-CM

## 2016-03-06 MED ORDER — HEPARIN SOD (PORK) LOCK FLUSH 100 UNIT/ML IV SOLN
500.0000 [IU] | Freq: Once | INTRAVENOUS | Status: AC | PRN
  Administered 2016-03-06: 500 [IU] via INTRAVENOUS
  Filled 2016-03-06: qty 5

## 2016-03-06 MED ORDER — SODIUM CHLORIDE 0.9 % IJ SOLN
10.0000 mL | INTRAMUSCULAR | Status: DC | PRN
  Administered 2016-03-06: 10 mL via INTRAVENOUS
  Filled 2016-03-06: qty 10

## 2016-03-06 NOTE — Telephone Encounter (Signed)
-----   Message from Gordy Levan, MD sent at 01/19/2016 11:14 AM EDT ----- Regarding: possible DC lovenox Nov, [facility] Needs lovenox refilled thru mid to late Nov RN please speak with her prior to finishing this amount of lovenox. If she is doing well and active daily, can DC lovenox then. See my note 10-12.  Will need PAC flushes ongoing. Next MD is to be Denman George in Jan  Thank you!

## 2016-03-06 NOTE — Telephone Encounter (Signed)
Pt s/w flush LPN [place]. Pt stated she is well and active. She stopped water aerobics d/t cost but is continuing activites.  Pt feels she does not need to continue lovenox.

## 2016-03-10 ENCOUNTER — Telehealth: Payer: Self-pay

## 2016-03-10 NOTE — Telephone Encounter (Signed)
Called pt re: dr livesay's attached message. Pt did mention she has pain intermittantly on front of shin and lower pole of knee cap. Started about Thursday or Friday.  She has been told she has arthritis in her knees. She does walk every day and is active with her grandchild. The lower leg is not swollen or red. No SOB, no chest pain. She will be calling today to make an appt with her PCP.

## 2016-03-10 NOTE — Telephone Encounter (Signed)
-----   Message from Gordy Levan, MD sent at 03/06/2016  5:36 PM EST ----- Regarding: off anticoagulation/ PAC flushes   Earlier MD message: Regarding: possible DC lovenox Nov, [facility] Needs lovenox refilled thru mid to late Nov RN please speak with her prior to finishing this amount of lovenox. If she is doing well and active daily, can DC lovenox then. See my note 10-12. Will need PAC flushes ongoing. Next MD is to be Denman George in Jan  RN message 11-30: Pt s/w flush LPN [place]. Pt stated she is well and active. She stopped water aerobics d/t cost but is continuing activites.  Pt feels she does not need to continue lovenox.  MD now: OK to stop lovenox. Please tell her that she needs to walk or do water aerobics at least 30 min 3x daily when stops anticoagulation. Do not sit longer than 30 min during day without standing and walking some. She should call if leg swelling, leg pain, SOB, chest pain. Remind her that PAC needs to be flushed every 6-8 weeks. It will be flushed when she sees Dr Denman George, then we need to be sure she continues flushes while that is in. I have sent scheduling message for some additional flushes now. (I copied this to gyn onc RNs as she does not have med onc f/u now)  Thank you

## 2016-03-21 MED FILL — PANTOPRAZOLE SOD DR 40 MG T: 40 | 30 days supply | Qty: 30 | Fill #1

## 2016-04-28 ENCOUNTER — Ambulatory Visit: Payer: BLUE CROSS/BLUE SHIELD | Admitting: Gynecologic Oncology

## 2016-04-28 ENCOUNTER — Other Ambulatory Visit: Payer: BLUE CROSS/BLUE SHIELD

## 2016-05-01 ENCOUNTER — Telehealth: Payer: Self-pay | Admitting: Licensed Clinical Social Worker

## 2016-05-01 NOTE — Progress Notes (Signed)
LCSW received call from patient.  She has not been able to come for [facility] appointment due to lack of transportation. Patient continues to experience health and financial stressors. Patient is unable to work at this time due to her health.   The following was discussed: family support, recent grief, guilt, peace, medical coverage, financial resources, managing stressors and self-care. LCSW utilized reflective listening, Solution Focus and Task Center approach.   Plan:  1. Patient will complete her disability application 2. Call [facility] to discuss available services  3. Follow up with LCSW by phone in one week    [name], LCSW Licensed Clinical Social Worker [place]   [phone] 12:03 PM

## 2016-05-08 ENCOUNTER — Other Ambulatory Visit: Payer: Self-pay

## 2016-05-08 ENCOUNTER — Telehealth: Payer: Self-pay | Admitting: Licensed Clinical Social Worker

## 2016-05-08 DIAGNOSIS — C561 Malignant neoplasm of right ovary: Secondary | ICD-10-CM

## 2016-05-08 NOTE — Progress Notes (Signed)
LCSW received follow up call from patient.  Informed LCSW she has contacted [facility] and has an assessment appointment scheduled on Feb 26th  Plan:   1. Patient will move forward with her disability application  2. Follow up with LCSW after her appointment on Feb. 26th   [name], LCSW Licensed Clinical Social Worker [place]   [phone] 1:51 PM

## 2016-05-08 NOTE — Progress Notes (Signed)
Followup of Consult: Gyn-Onc  CC:  Chief Complaint  Patient presents with  . Ovarian Cancer    Assessment/Plan:  [name]  is a 51 y.o.  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  She is s/p exploratory laparotomy and TAH, BSO, optimal cytoreduction to no residual visible disease on 03/13/15. S/p 6 cycles of adjuvant carboplatin and paclitaxel chemotherapy completed 08/02/15. Genetic testing negative.  New diagnosis of PE in May, 2017 - on lovenox.  Complete response to treatment.  Decrease in equivocal pelvic nodularity on post treatment imaging from May 2017 CT - no evidence of recurrence/progression. CA 125 normal in October, 2017 - recheck today  HPI: [name] is a very pleasant 51 year old woman with history of 2 prior cesarean sections who is seen initially in the hospital as an inpatient consultation at [facility] on 02/19/2015 for complex pelvic masses. The patient is a morbidly obese woman with a BMI 62 kg/m. She was admitted to [facility] on 02/18/2015 with pyelonephritis. CT imaging and then MRI imaging was performed to evaluate for an etiology for pyelonephritis. This identified A large pelvic mass which it to be arising from the right adnexa. It measured 13 x 9 x 17.5 cm and was irregular cystic and solid in nature. A second, but cystic and solid lesion within the left adnexa measuring 4.4 x 6.5 x 5.3 cm. In the third cystic and solid mass was seen anterior to the uterus and superior to the bladder measuring 4.2 x 5.4 x 5.3 cm. She had no pelvic lymphadenopathy, no carcinomatosis, and CA 125 was normal at 21U/mL.  Her infection was adequately treated with IV antibiotics and her acute kidney injury which manifested with an elevated creatinine 12.4 spontaneously resolved during her hospital stay with avoidance of NSAID and good hydration. She was noted to be anemic during her hospitalization with a  hemoglobin of 10.8. However this did not require transfusion and spontaneously improved over time.   On 03/13/2015 she underwent an exploratory laparotomy TAH/BSO and radical tumor debulking for a stage IIIc carcinosarcoma of the right ovary and an incidentally found synchronous stage I a endometrial cancer. Tumor was found with a large right ovarian mass, smaller left ovarian mass and a carcinosarcoma implant was involving the anterior cul-de-sac between the bladder and lower uterine segment. All disease sites were completely resected with no macroscopic disease Rainey at the completion of debulking.   She went on to receive 6 cycles of adjuvant chemotherapy with carboplatin and paclitaxel between 04/12/15 and 08/02/15. She tolerated treatment well with some neutropenia. CA 125 was normal at 4.1 at completion of therapy in March, 2017. On 08/23/15 she underwent post-treatment CT imaging which showed: there is a 2.5 x 1.6 cm soft tissue nodule near the vaginal cuff. Within the right hemipelvis there is a 1.8 cm soft tissue nodule.   Repeat CT on 11/21/15 showed that the small bilateral deep pelvic side wall soft tissue masses have decreased in size and are less suggestive of persistent/recurrent disease.  She has a stable small left ventral hernia.   Interval History:   CA 125 in October, 2017 normal.  She has been feeling depressed lately as her aunt recently died from the same disease.   She denies bleeding or abdominal bloating.  Current Meds:  Outpatient Encounter Prescriptions as of 05/09/2016  Medication Sig  . acetaminophen (TYLENOL) 500 MG tablet Take 1,000 mg by mouth every 6 (six) hours  as needed for mild pain, moderate pain, fever or headache. Reported on 09/10/2015  . cetirizine (ZYRTEC) 10 MG tablet Take 1 tablet (10 mg total) by mouth daily as needed for allergies (itching).  . gabapentin (NEURONTIN) 100 MG capsule Take 100 mg by mouth at bedtime as needed.   . lidocaine-prilocaine (EMLA)  cream Apply to PAC site 1-2 hours prior to access as needed.  . pantoprazole (PROTONIX) 40 MG tablet TAKE 1 TABLET BY MOUTH ONCE DAILY  . predniSONE (DELTASONE) 20 MG tablet Take 1 tablet (20 mg total) by mouth daily with breakfast. For 5 days  . triamcinolone cream (KENALOG) 0.1 % Apply 1 application topically 2 (two) times daily as needed.  . [DISCONTINUED] enoxaparin (LOVENOX) 150 MG/ML injection Inject 1 mL (150 mg total) into the skin every 12 (twelve) hours.  . [DISCONTINUED] ferrous fumarate (HEMOCYTE - 106 MG FE) 325 (106 Fe) MG TABS tablet Take 1 tabletpo daily  on an empty stomach with OJ   Facility-Administered Encounter Medications as of 05/09/2016  Medication  . sodium chloride flush (NS) 0.9 % injection 10 mL  . sodium chloride flush (NS) 0.9 % injection 10 mL    Allergy:  Allergies  Allergen Reactions  . Penicillins Rash    Has patient had a PCN reaction causing immediate rash, facial/tongue/throat swelling, SOB or lightheadedness with hypotension: no Has patient had a PCN reaction causing severe rash involving mucus membranes or skin necrosis: no Has patient had a PCN reaction that required hospitalization in the hospital at the time Has patient had a PCN reaction occurring within the last 10 years: no If all of the above answers are "NO", then may proceed with Cephalosporin use.     Social Hx:   Social History   Social History  . Marital status: Legally Separated    Spouse name: N/A  . Number of children: N/A  . Years of education: N/A   Occupational History  . Not on file.   Social History Main Topics  . Smoking status: Never Smoker  . Smokeless tobacco: Never Used  . Alcohol use 0.0 oz/week     Comment: Maybe wine every 6 months  . Drug use: No  . Sexual activity: Yes     Comment: Told she could not have more children in 1986   Other Topics Concern  . 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   Married x 23 years, recently separated (4/15).  No domestic violence.  + financial stress.     Lives previously with husband who moved out, now with daughter who has medical problems, including Hodgkin's disease, and granddaughter (age 32).     Uses seat belt.     Past Surgical Hx:  Past Surgical History:  Procedure Laterality Date  . ABDOMINAL HYSTERECTOMY N/A 03/13/2015   Procedure: TOTAL HYSTERECTOMY ABDOMINAL BILATERAL SALPINGO OOPHORECTOMY RADICAL TUMOR [place];  Surgeon: Everitt Amber, MD;  Location: [facility];  Service: Gynecology;  Laterality: N/A;  . BOWEL RESECTION  03/13/2015   Procedure: SMALL BOWEL RESECTION;  Surgeon: Everitt Amber, MD;  Location: [facility];  Service: Gynecology;;  . [place] and 1984  . COLOSTOMY  2008  . COLOSTOMY TAKEDOWN    . LAPAROTOMY N/A 03/13/2015   Procedure: EXPLORATORY LAPAROTOMY;  Surgeon: Everitt Amber, MD;  Location: [facility];  Service: Gynecology;  Laterality: N/A;  . TONSILLECTOMY AND ADENOIDECTOMY  1997  . VENTRAL HERNIA REPAIR  03/13/2015   Procedure: HERNIA REPAIR VENTRAL ADULT;  Surgeon: Everitt Amber, MD;  Location: [facility];  Service: Gynecology;;    Past Medical Hx:  Past Medical History:  Diagnosis Date  . Anemia   . Cough   . Diverticulitis with perforation 2010  . Endometrial cancer ([place])   . Family history of adverse reaction to anesthesia    pts daughter had difficulty awakening following anesthesia   . GERD (gastroesophageal reflux disease)   . Headache   . History of bronchitis   . History of ear infections   . Hx of migraines   . Hypertension   . IBS (irritable bowel syndrome)   . Kidney infection   . Morbid obesity ([place])   . Ovarian cancer ([place]) dx'd 01/2015  . Pyelonephritis     Past Gynecological History:  C/s x 2  Patient's last menstrual period was 10/26/2013.  Family Hx:  Family History  Problem Relation Age of Onset  . Diabetes Mother   .  Hypertension Mother   . Hyperlipidemia Mother   . Colon polyps Mother     4 total  . Fibroids Mother     s/p hysterectomy  . Hyperlipidemia Father   . Diabetes Daughter   . Hodgkin's lymphoma Daughter 38  . Asthma Maternal Aunt     severe - d. 6  . Prostate cancer Maternal Uncle 69  . Diabetes Paternal Aunt   . Diabetes Paternal Uncle   . Kidney failure Maternal Grandmother 84  . Pancreatic cancer Paternal Grandmother     dx. >50  . Diabetes Paternal Grandfather   . Diabetes Paternal Aunt   . Pancreatic cancer Cousin 43    paternal 1st cousin  . Heart disease Neg Hx   . Stroke Neg Hx     Review of Systems:  Constitutional  Feels well,    ENT Normal appearing ears and nares bilaterally Skin/Breast  No rash, sores, jaundice, itching, dryness Cardiovascular  No chest pain, shortness of breath, or edema  Pulmonary  No cough or wheeze.  Gastro Intestinal  No nausea, vomitting, or diarrhoea. No bright red blood per rectum, nochange in bowel movement, or constipation.  Genito Urinary  No frequency, urgency, dysuria,  Musculo Skeletal  + bilateral knee pain with ambulation Neurologic  No weakness, numbness, change in gait,  Psychology  No depression, anxiety, insomnia.   Vitals:  Blood pressure 130/69, pulse 77, temperature 97.4 F (36.3 C), temperature source Oral, resp. rate 18, height 5\' 5"  (1.651 m), weight (!) 308 lb 12.8 oz (140.1 kg), last menstrual period 10/26/2013, SpO2 100 %.  Physical Exam: WD in NAD Neck  Supple NROM, without any enlargements.  Lymph Node Survey No cervical supraclavicular or inguinal adenopathy Cardiovascular  Pulse normal rate, regularity and rhythm. S1 and S2 normal.  Lungs  Clear to auscultation bilateraly, without wheezes/crackles/rhonchi. Good air movement.  Skin  No rash/lesions/breakdown  Psychiatry  Alert and oriented to person, place, and time  Abdomen  Normoactive bowel sounds, abdomen soft, non-tender and obese  without palpable evidence of hernia. There is a large midline incision which is healed. Back No CVA tenderness Genito Urinary  Vulva/vagina: Normal external female genitalia.  No lesions. No discharge or bleeding.  Bladder/urethra:  No lesions or masses, well supported bladder  Vagina: cuff in tact.  Cervix and uterus: surgically absent  Adnexa: no palpable masses. Rectal  Good tone, no  masses no cul de sac nodularity.  Extremities  No bilateral cyanosis, clubbing or edema.   Donaciano Eva, MD  05/09/2016, 11:12 AM

## 2016-05-09 ENCOUNTER — Ambulatory Visit (HOSPITAL_BASED_OUTPATIENT_CLINIC_OR_DEPARTMENT_OTHER): Payer: BLUE CROSS/BLUE SHIELD

## 2016-05-09 ENCOUNTER — Ambulatory Visit: Payer: BLUE CROSS/BLUE SHIELD

## 2016-05-09 ENCOUNTER — Ambulatory Visit: Payer: BLUE CROSS/BLUE SHIELD | Attending: Gynecologic Oncology | Admitting: Gynecologic Oncology

## 2016-05-09 ENCOUNTER — Encounter: Payer: Self-pay | Admitting: Gynecologic Oncology

## 2016-05-09 VITALS — BP 130/69 | HR 77 | Temp 97.4°F | Resp 18 | Ht 65.0 in | Wt 308.8 lb

## 2016-05-09 DIAGNOSIS — K589 Irritable bowel syndrome without diarrhea: Secondary | ICD-10-CM | POA: Insufficient documentation

## 2016-05-09 DIAGNOSIS — Z86711 Personal history of pulmonary embolism: Secondary | ICD-10-CM

## 2016-05-09 DIAGNOSIS — Z833 Family history of diabetes mellitus: Secondary | ICD-10-CM | POA: Insufficient documentation

## 2016-05-09 DIAGNOSIS — Z880 Allergy status to penicillin: Secondary | ICD-10-CM | POA: Insufficient documentation

## 2016-05-09 DIAGNOSIS — Z8249 Family history of ischemic heart disease and other diseases of the circulatory system: Secondary | ICD-10-CM | POA: Diagnosis not present

## 2016-05-09 DIAGNOSIS — Z90722 Acquired absence of ovaries, bilateral: Secondary | ICD-10-CM | POA: Diagnosis not present

## 2016-05-09 DIAGNOSIS — K219 Gastro-esophageal reflux disease without esophagitis: Secondary | ICD-10-CM | POA: Insufficient documentation

## 2016-05-09 DIAGNOSIS — Z8371 Family history of colonic polyps: Secondary | ICD-10-CM | POA: Diagnosis not present

## 2016-05-09 DIAGNOSIS — Z807 Family history of other malignant neoplasms of lymphoid, hematopoietic and related tissues: Secondary | ICD-10-CM | POA: Diagnosis not present

## 2016-05-09 DIAGNOSIS — Z8543 Personal history of malignant neoplasm of ovary: Secondary | ICD-10-CM | POA: Insufficient documentation

## 2016-05-09 DIAGNOSIS — Z6841 Body Mass Index (BMI) 40.0 and over, adult: Secondary | ICD-10-CM | POA: Insufficient documentation

## 2016-05-09 DIAGNOSIS — Z9221 Personal history of antineoplastic chemotherapy: Secondary | ICD-10-CM | POA: Insufficient documentation

## 2016-05-09 DIAGNOSIS — Z08 Encounter for follow-up examination after completed treatment for malignant neoplasm: Secondary | ICD-10-CM | POA: Insufficient documentation

## 2016-05-09 DIAGNOSIS — Z8542 Personal history of malignant neoplasm of other parts of uterus: Secondary | ICD-10-CM

## 2016-05-09 DIAGNOSIS — Z90710 Acquired absence of both cervix and uterus: Secondary | ICD-10-CM | POA: Diagnosis not present

## 2016-05-09 DIAGNOSIS — C569 Malignant neoplasm of unspecified ovary: Secondary | ICD-10-CM

## 2016-05-09 DIAGNOSIS — Z452 Encounter for adjustment and management of vascular access device: Secondary | ICD-10-CM

## 2016-05-09 DIAGNOSIS — Z95828 Presence of other vascular implants and grafts: Secondary | ICD-10-CM

## 2016-05-09 DIAGNOSIS — I10 Essential (primary) hypertension: Secondary | ICD-10-CM | POA: Diagnosis not present

## 2016-05-09 DIAGNOSIS — C561 Malignant neoplasm of right ovary: Secondary | ICD-10-CM

## 2016-05-09 DIAGNOSIS — Z7901 Long term (current) use of anticoagulants: Secondary | ICD-10-CM | POA: Diagnosis not present

## 2016-05-09 MED ORDER — SODIUM CHLORIDE 0.9% FLUSH
10.0000 mL | INTRAVENOUS | Status: DC | PRN
  Administered 2016-05-09: 10 mL via INTRAVENOUS
  Filled 2016-05-09: qty 10

## 2016-05-09 MED ORDER — HEPARIN SOD (PORK) LOCK FLUSH 100 UNIT/ML IV SOLN
500.0000 [IU] | Freq: Once | INTRAVENOUS | Status: AC | PRN
  Administered 2016-05-09: 500 [IU] via INTRAVENOUS
  Filled 2016-05-09: qty 5

## 2016-05-10 LAB — CA 125: Cancer Antigen (CA) 125: 2.8 U/mL (ref 0.0–38.1)

## 2016-05-12 ENCOUNTER — Telehealth: Payer: Self-pay

## 2016-05-12 NOTE — Telephone Encounter (Signed)
LM in VM stating that her CA-125 on 05-09-16 was 2.8.  This is stable and normal per Melissa Cross,NP. She can call back to [phone] if she has any questions or concerns.

## 2016-06-11 ENCOUNTER — Other Ambulatory Visit: Payer: Self-pay | Admitting: Family Medicine

## 2016-06-11 MED FILL — PANTOPRAZOLE SOD DR 40 MG T: 40 | 30 days supply | Qty: 30 | Fill #2

## 2016-06-26 ENCOUNTER — Telehealth: Payer: Self-pay | Admitting: Gynecologic Oncology

## 2016-06-26 ENCOUNTER — Telehealth: Payer: Self-pay | Admitting: *Deleted

## 2016-06-26 NOTE — Telephone Encounter (Signed)
Left message for patient re 3/28 flush. Appointment time for 9:15 am communicated with patient per 3/28 sch msg is not an available appointment time for a flush visit type (30 min).

## 2016-06-26 NOTE — Telephone Encounter (Signed)
"  I've been calling for two days to reschedule today's flush appointment.  I can come in on Wednesday."  Request 07-02-2016 at 9:15 am appointment slot which is available at time of call.  Scheduling message sent.  Understsnds a scheduler will call if this time availability changes.

## 2016-07-02 ENCOUNTER — Ambulatory Visit (HOSPITAL_BASED_OUTPATIENT_CLINIC_OR_DEPARTMENT_OTHER): Payer: BLUE CROSS/BLUE SHIELD

## 2016-07-02 DIAGNOSIS — Z95828 Presence of other vascular implants and grafts: Secondary | ICD-10-CM

## 2016-07-02 DIAGNOSIS — C561 Malignant neoplasm of right ovary: Secondary | ICD-10-CM | POA: Diagnosis not present

## 2016-07-02 DIAGNOSIS — Z452 Encounter for adjustment and management of vascular access device: Secondary | ICD-10-CM | POA: Diagnosis not present

## 2016-07-02 MED ORDER — SODIUM CHLORIDE 0.9% FLUSH
10.0000 mL | INTRAVENOUS | Status: DC | PRN
  Administered 2016-07-02: 10 mL via INTRAVENOUS
  Filled 2016-07-02: qty 10

## 2016-07-02 MED ORDER — HEPARIN SOD (PORK) LOCK FLUSH 100 UNIT/ML IV SOLN
500.0000 [IU] | Freq: Once | INTRAVENOUS | Status: AC | PRN
  Administered 2016-07-02: 500 [IU] via INTRAVENOUS
  Filled 2016-07-02: qty 5

## 2016-08-15 ENCOUNTER — Telehealth: Payer: Self-pay

## 2016-08-15 NOTE — Telephone Encounter (Signed)
Ms Denne called stating that she has been experiencing some right lower abdominal pain.  Onset last week.  Bobbye Riggs has begun to work out.  She just did some walking this week but still feels the cramping intermittently. Her bowels are moving well. No vaginal bleeding. Told her that she is scheduled for a lab, flush, and visit with Dr. Denman George on May 21,2018 at 1215. Told her to call back to the office prior to the appointment if the pain increases and will see if Dr. Denman George wants to order a scan prior to her visit. Pt verbalized understanding.

## 2016-08-25 ENCOUNTER — Ambulatory Visit: Payer: BLUE CROSS/BLUE SHIELD | Attending: Gynecologic Oncology | Admitting: Gynecologic Oncology

## 2016-08-25 ENCOUNTER — Ambulatory Visit (HOSPITAL_BASED_OUTPATIENT_CLINIC_OR_DEPARTMENT_OTHER): Payer: BLUE CROSS/BLUE SHIELD

## 2016-08-25 ENCOUNTER — Other Ambulatory Visit: Payer: BLUE CROSS/BLUE SHIELD

## 2016-08-25 ENCOUNTER — Encounter: Payer: Self-pay | Admitting: Gynecologic Oncology

## 2016-08-25 VITALS — BP 133/72 | HR 66 | Temp 97.5°F | Resp 18 | Wt 382.5 lb

## 2016-08-25 DIAGNOSIS — Z8543 Personal history of malignant neoplasm of ovary: Secondary | ICD-10-CM

## 2016-08-25 DIAGNOSIS — Z90710 Acquired absence of both cervix and uterus: Secondary | ICD-10-CM | POA: Diagnosis not present

## 2016-08-25 DIAGNOSIS — I10 Essential (primary) hypertension: Secondary | ICD-10-CM | POA: Insufficient documentation

## 2016-08-25 DIAGNOSIS — C561 Malignant neoplasm of right ovary: Secondary | ICD-10-CM

## 2016-08-25 DIAGNOSIS — C569 Malignant neoplasm of unspecified ovary: Secondary | ICD-10-CM | POA: Diagnosis present

## 2016-08-25 DIAGNOSIS — Z8542 Personal history of malignant neoplasm of other parts of uterus: Secondary | ICD-10-CM | POA: Diagnosis not present

## 2016-08-25 DIAGNOSIS — Z79899 Other long term (current) drug therapy: Secondary | ICD-10-CM | POA: Diagnosis not present

## 2016-08-25 DIAGNOSIS — C541 Malignant neoplasm of endometrium: Secondary | ICD-10-CM | POA: Diagnosis not present

## 2016-08-25 DIAGNOSIS — Z90722 Acquired absence of ovaries, bilateral: Secondary | ICD-10-CM

## 2016-08-25 DIAGNOSIS — Z808 Family history of malignant neoplasm of other organs or systems: Secondary | ICD-10-CM | POA: Diagnosis not present

## 2016-08-25 DIAGNOSIS — R1031 Right lower quadrant pain: Secondary | ICD-10-CM | POA: Insufficient documentation

## 2016-08-25 DIAGNOSIS — Z86711 Personal history of pulmonary embolism: Secondary | ICD-10-CM

## 2016-08-25 DIAGNOSIS — Z833 Family history of diabetes mellitus: Secondary | ICD-10-CM | POA: Insufficient documentation

## 2016-08-25 DIAGNOSIS — K219 Gastro-esophageal reflux disease without esophagitis: Secondary | ICD-10-CM | POA: Diagnosis not present

## 2016-08-25 DIAGNOSIS — Z880 Allergy status to penicillin: Secondary | ICD-10-CM | POA: Diagnosis not present

## 2016-08-25 DIAGNOSIS — Z6841 Body Mass Index (BMI) 40.0 and over, adult: Secondary | ICD-10-CM | POA: Insufficient documentation

## 2016-08-25 DIAGNOSIS — Z9221 Personal history of antineoplastic chemotherapy: Secondary | ICD-10-CM

## 2016-08-25 DIAGNOSIS — Z95828 Presence of other vascular implants and grafts: Secondary | ICD-10-CM

## 2016-08-25 MED ORDER — SODIUM CHLORIDE 0.9% FLUSH
10.0000 mL | INTRAVENOUS | Status: DC | PRN
  Administered 2016-08-25: 10 mL via INTRAVENOUS
  Filled 2016-08-25: qty 10

## 2016-08-25 MED ORDER — HEPARIN SOD (PORK) LOCK FLUSH 100 UNIT/ML IV SOLN
500.0000 [IU] | Freq: Once | INTRAVENOUS | Status: AC | PRN
  Administered 2016-08-25: 500 [IU] via INTRAVENOUS
  Filled 2016-08-25: qty 5

## 2016-08-25 NOTE — Progress Notes (Signed)
Followup of Consult: Gyn-Onc  CC:  Chief Complaint  Patient presents with  . Endometrial cancer  . Ovarian Cancer    Assessment/Plan:  [name]  is a 51 y.o.  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  She is s/p exploratory laparotomy and TAH, BSO, optimal cytoreduction to no residual visible disease on 03/13/15. S/p 6 cycles of adjuvant carboplatin and paclitaxel chemotherapy completed 08/02/15. Genetic testing negative.  Hx of PE in May, 2017 - no longer on lovenox.  Complete response to treatment.  Decrease in equivocal pelvic nodularity on post treatment imaging from May 2017 CT - no evidence of recurrence/progression. CA 125 normal in October, 2017 - recheck today  HPI: [name] is a very pleasant 51 year old woman with history of 2 prior cesarean sections who is seen initially in the hospital as an inpatient consultation at [facility] on 02/19/2015 for complex pelvic masses. The patient is a morbidly obese woman with a BMI 62 kg/m. She was admitted to [facility] on 02/18/2015 with pyelonephritis. CT imaging and then MRI imaging was performed to evaluate for an etiology for pyelonephritis. This identified A large pelvic mass which it to be arising from the right adnexa. It measured 13 x 9 x 17.5 cm and was irregular cystic and solid in nature. A second, but cystic and solid lesion within the left adnexa measuring 4.4 x 6.5 x 5.3 cm. In the third cystic and solid mass was seen anterior to the uterus and superior to the bladder measuring 4.2 x 5.4 x 5.3 cm. She had no pelvic lymphadenopathy, no carcinomatosis, and CA 125 was normal at 21U/mL.  Her infection was adequately treated with IV antibiotics and her acute kidney injury which manifested with an elevated creatinine 12.4 spontaneously resolved during her hospital stay with avoidance of NSAID and good hydration. She was noted to be anemic during her  hospitalization with a hemoglobin of 10.8. However this did not require transfusion and spontaneously improved over time.   On 03/13/2015 she underwent an exploratory laparotomy TAH/BSO and radical tumor debulking for a stage IIIc carcinosarcoma of the right ovary and an incidentally found synchronous stage I a endometrial cancer. Tumor was found with a large right ovarian mass, smaller left ovarian mass and a carcinosarcoma implant was involving the anterior cul-de-sac between the bladder and lower uterine segment. All disease sites were completely resected with no macroscopic disease Rainey at the completion of debulking.   She went on to receive 6 cycles of adjuvant chemotherapy with carboplatin and paclitaxel between 04/12/15 and 08/02/15. She tolerated treatment well with some neutropenia. CA 125 was normal at 4.1 at completion of therapy in March, 2017. On 08/23/15 she underwent post-treatment CT imaging which showed: there is a 2.5 x 1.6 cm soft tissue nodule near the vaginal cuff. Within the right hemipelvis there is a 1.8 cm soft tissue nodule.   Repeat CT on 11/21/15 showed that the small bilateral deep pelvic side wall soft tissue masses have decreased in size and are less suggestive of persistent/recurrent disease.  She has a stable small left ventral hernia.   Interval History:   CA 125 in October, 2017 normal.   She has had 2 weeks of right lower quadrant abdominal pain - cramping, menstrual like cramps  Current Meds:  Outpatient Encounter Prescriptions as of 08/25/2016  Medication Sig  . acetaminophen (TYLENOL) 500 MG tablet Take 1,000 mg by mouth every 6 (six) hours as  needed for mild pain, moderate pain, fever or headache. Reported on 09/10/2015  . cetirizine (ZYRTEC) 10 MG tablet Take 1 tablet (10 mg total) by mouth daily as needed for allergies (itching).  . gabapentin (NEURONTIN) 100 MG capsule Take 100 mg by mouth at bedtime as needed.   . lidocaine-prilocaine (EMLA) cream Apply to  PAC site 1-2 hours prior to access as needed.  . pantoprazole (PROTONIX) 40 MG tablet TAKE 1 TABLET BY MOUTH ONCE DAILY  . predniSONE (DELTASONE) 20 MG tablet Take 1 tablet (20 mg total) by mouth daily with breakfast. For 5 days  . triamcinolone cream (KENALOG) 0.1 % APPLY TO THE AFFECTED AREA(S) TWICE DAILY AS NEEDED   Facility-Administered Encounter Medications as of 08/25/2016  Medication  . sodium chloride flush (NS) 0.9 % injection 10 mL  . sodium chloride flush (NS) 0.9 % injection 10 mL  . [DISCONTINUED] sodium chloride flush (NS) 0.9 % injection 10 mL    Allergy:  Allergies  Allergen Reactions  . Penicillins Rash    Has patient had a PCN reaction causing immediate rash, facial/tongue/throat swelling, SOB or lightheadedness with hypotension: no Has patient had a PCN reaction causing severe rash involving mucus membranes or skin necrosis: no Has patient had a PCN reaction that required hospitalization in the hospital at the time Has patient had a PCN reaction occurring within the last 10 years: no If all of the above answers are "NO", then may proceed with Cephalosporin use.     Social Hx:   Social History   Social History  . Marital status: Legally Separated    Spouse name: N/A  . Number of children: N/A  . Years of education: N/A   Occupational History  . Not on file.   Social History Main Topics  . Smoking status: Never Smoker  . Smokeless tobacco: Never Used  . Alcohol use 0.0 oz/week     Comment: Maybe wine every 6 months  . Drug use: No  . Sexual activity: Yes     Comment: Told she could not have more children in 1986   Other Topics Concern  . 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   Married x 23 years, recently separated (4/15).  No domestic violence.  + financial stress.     Lives previously with husband who moved out, now with  daughter who has medical problems, including Hodgkin's disease, and granddaughter (age 30).     Uses seat belt.     Past Surgical Hx:  Past Surgical History:  Procedure Laterality Date  . ABDOMINAL HYSTERECTOMY N/A 03/13/2015   Procedure: TOTAL HYSTERECTOMY ABDOMINAL BILATERAL SALPINGO OOPHORECTOMY RADICAL TUMOR [place];  Surgeon: Everitt Amber, MD;  [facility];  Service: Gynecology;  Laterality: N/A;  . BOWEL RESECTION  03/13/2015   Procedure: SMALL BOWEL RESECTION;  Surgeon: Everitt Amber, MD;  Location: [facility];  Service: Gynecology;;  . [place] and 1984  . COLOSTOMY  2008  . COLOSTOMY TAKEDOWN    . LAPAROTOMY N/A 03/13/2015   Procedure: EXPLORATORY LAPAROTOMY;  Surgeon: Everitt Amber, MD;  [facility];  Service: Gynecology;  Laterality: N/A;  . TONSILLECTOMY AND ADENOIDECTOMY  1997  . VENTRAL HERNIA REPAIR  03/13/2015   Procedure: HERNIA REPAIR VENTRAL ADULT;  Surgeon: Everitt Amber, MD;  [facility];  Service: Gynecology;;  Past Medical Hx:  Past Medical History:  Diagnosis Date  . Anemia   . Cough   . Diverticulitis with perforation 2010  . Endometrial cancer ([place])   . Family history of adverse reaction to anesthesia    pts daughter had difficulty awakening following anesthesia   . GERD (gastroesophageal reflux disease)   . Headache   . History of bronchitis   . History of ear infections   . Hx of migraines   . Hypertension   . IBS (irritable bowel syndrome)   . Kidney infection   . Morbid obesity ([place])   . Ovarian cancer ([place]) dx'd 01/2015  . Pyelonephritis     Past Gynecological History:  C/s x 2  Patient's last menstrual period was 10/26/2013.  Family Hx:  Family History  Problem Relation Age of Onset  . Diabetes Mother   . Hypertension Mother   . Hyperlipidemia Mother   . Colon polyps Mother        4 total  . Fibroids Mother        s/p hysterectomy  . Hyperlipidemia Father   . Diabetes Daughter   . Hodgkin's lymphoma Daughter 16  .  Asthma Maternal Aunt        severe - d. 76  . Prostate cancer Maternal Uncle 22  . Diabetes Paternal Aunt   . Diabetes Paternal Uncle   . Kidney failure Maternal Grandmother 84  . Pancreatic cancer Paternal Grandmother        dx. >50  . Diabetes Paternal Grandfather   . Diabetes Paternal Aunt   . Pancreatic cancer Cousin 74       paternal 1st cousin  . Heart disease Neg Hx   . Stroke Neg Hx     Review of Systems:  Constitutional  Feels well,    ENT Normal appearing ears and nares bilaterally Skin/Breast  No rash, sores, jaundice, itching, dryness Cardiovascular  No chest pain, shortness of breath, or edema  Pulmonary  No cough or wheeze.  Gastro Intestinal  No nausea, vomitting, or diarrhoea. No bright red blood per rectum, nochange in bowel movement, or constipation.  Genito Urinary  No frequency, urgency, dysuria,  Musculo Skeletal  + bilateral knee pain with ambulation Neurologic  No weakness, numbness, change in gait,  Psychology  No depression, anxiety, insomnia.   Vitals:  Blood pressure 133/72, pulse 66, temperature 97.5 F (36.4 C), temperature source Oral, resp. rate 18, weight (!) 382 lb 8 oz (173.5 kg), last menstrual period 10/26/2013.  Physical Exam: WD in NAD Neck  Supple NROM, without any enlargements.  Lymph Node Survey No cervical supraclavicular or inguinal adenopathy Cardiovascular  Pulse normal rate, regularity and rhythm. S1 and S2 normal.  Lungs  Clear to auscultation bilateraly, without wheezes/crackles/rhonchi. Good air movement.  Skin  No rash/lesions/breakdown  Psychiatry  Alert and oriented to person, place, and time  Abdomen  Normoactive bowel sounds, abdomen soft, non-tender and obese without palpable evidence of hernia. There is a large midline incision which is healed. Back No CVA tenderness Genito Urinary  Vulva/vagina: Normal external female genitalia.  No lesions. No discharge or bleeding.  Bladder/urethra:  No lesions or  masses, well supported bladder  Vagina: cuff in tact.  Cervix and uterus: surgically absent  Adnexa: no palpable masses. Rectal  Good tone, no masses no cul de sac nodularity.  Extremities  No bilateral cyanosis, clubbing or edema.   Donaciano Eva, MD  08/25/2016, 2:12 PM

## 2016-08-25 NOTE — Patient Instructions (Signed)
Plan to have a CT scan of the abdomen and pelvis.  We will contact you with the results.  Follow up will be based on CT scan results (follow up in three months if no intervention needed).  Please call our office for any questions or concerns.

## 2016-08-26 ENCOUNTER — Ambulatory Visit: Payer: BLUE CROSS/BLUE SHIELD | Admitting: Family Medicine

## 2016-08-26 ENCOUNTER — Other Ambulatory Visit: Payer: Self-pay | Admitting: Family Medicine

## 2016-08-26 DIAGNOSIS — C561 Malignant neoplasm of right ovary: Secondary | ICD-10-CM

## 2016-08-26 NOTE — Telephone Encounter (Signed)
Patient needs refill on RX triamcinolone cream & RX pantoprazole. Out patient pharmacy @ [place]. Patient may be reached at [phone].

## 2016-08-27 MED ORDER — TRIAMCINOLONE ACETONIDE 0.1 % EX CREA: TOPICAL_CREAM | CUTANEOUS | 1 refills | Status: DC

## 2016-08-27 MED ORDER — PANTOPRAZOLE SODIUM 40 MG PO TBEC: 40.0000 mg | DELAYED_RELEASE_TABLET | Freq: Every day | ORAL | 2 refills | Status: DC

## 2016-08-28 ENCOUNTER — Encounter (HOSPITAL_COMMUNITY): Payer: Self-pay

## 2016-08-28 ENCOUNTER — Ambulatory Visit (HOSPITAL_COMMUNITY)
Admission: RE | Admit: 2016-08-28 | Discharge: 2016-08-28 | Disposition: A | Discharge status: Home / Self Care | Payer: BLUE CROSS/BLUE SHIELD | Source: Ambulatory Visit | Attending: Gynecologic Oncology | Admitting: Gynecologic Oncology

## 2016-08-28 DIAGNOSIS — C569 Malignant neoplasm of unspecified ovary: Secondary | ICD-10-CM | POA: Insufficient documentation

## 2016-08-28 DIAGNOSIS — Z90710 Acquired absence of both cervix and uterus: Secondary | ICD-10-CM | POA: Diagnosis not present

## 2016-08-28 LAB — CA 125: CANCER ANTIGEN (CA) 125: 3.4 U/mL (ref 0.0–38.1)

## 2016-08-28 MED ORDER — HEPARIN SOD (PORK) LOCK FLUSH 100 UNIT/ML IV SOLN
INTRAVENOUS | Status: AC
  Administered 2016-08-28: 500 [IU]
  Filled 2016-08-28: qty 5

## 2016-08-28 MED ORDER — IOPAMIDOL (ISOVUE-300) INJECTION 61%
INTRAVENOUS | Status: AC
  Administered 2016-08-28: 100 mL
  Filled 2016-08-28: qty 100

## 2016-08-29 ENCOUNTER — Telehealth: Payer: Self-pay

## 2016-08-29 NOTE — Telephone Encounter (Signed)
LM for [name] that her CA-125 was 3.4 and that the CT scan of the abdomen showed no evidence of cancer in the abdomen per Melissa Cross,NP.

## 2016-09-09 ENCOUNTER — Encounter: Payer: Self-pay | Admitting: Family Medicine

## 2016-09-09 ENCOUNTER — Ambulatory Visit (INDEPENDENT_AMBULATORY_CARE_PROVIDER_SITE_OTHER): Payer: BLUE CROSS/BLUE SHIELD | Admitting: Family Medicine

## 2016-09-09 VITALS — BP 112/62 | HR 68 | Temp 97.7°F | Ht 65.0 in | Wt 382.8 lb

## 2016-09-09 DIAGNOSIS — R109 Unspecified abdominal pain: Secondary | ICD-10-CM

## 2016-09-09 DIAGNOSIS — M2550 Pain in unspecified joint: Secondary | ICD-10-CM | POA: Diagnosis not present

## 2016-09-09 LAB — POCT SEDIMENTATION RATE: POCT SED RATE: 25 mm/hr — AB (ref 0–22)

## 2016-09-09 MED FILL — PANTOPRAZOLE SOD DR 40 MG T: 40 | 30 days supply | Qty: 30 | Fill #0

## 2016-09-09 MED FILL — TRIAMCINOLONE 0.1% CREAM: 0.1 | 15 days supply | Qty: 30 | Fill #0

## 2016-09-09 NOTE — Progress Notes (Signed)
Date of Visit: 09/09/2016   HPI:  Patient presents for the following:  - abdominal pain - has had pain on her right side. Felt like menstrual cramps but she is s/p hysterectomy for both ovarian and endometrial cancer. Saw her oncologist who ordered a CT scan which was unremarkable. No blood in stool. Has cramps occasionally. Due for colonoscopy. Takes protonix for heartburn, which helps.  - joint pains - has felt pain in her shoulders and hands. Comes and goes. Feels like she has decreased strenght in her hands over the last couple of weeks. Episodes of pain last from seconds to minutes. Feels painful, dull, and achy. No injuries to her hands or shoulders. No neck pain. Feels crampy throughout.  ROS: See HPI.  [place]: history of hypertension, ovarian cancer, endometrial cancer, iron deficiency, CKD3, morbid obesity, prior PE/DVT  PHYSICAL EXAM: BP 112/62   Pulse 68   Temp 97.7 F (36.5 C) (Oral)   Ht 5\' 5"  (1.651 m)   Wt (!) 382 lb 12.8 oz (173.6 kg)   LMP 10/26/2013   SpO2 98%   BMI 63.70 kg/m  Gen: no acute distress, pleasant, cooperative HEENT: normocephalic, atraumatic, moist mucous membranes  Heart: regular rate and rhythm, no murmur Lungs: clear to auscultation bilaterally, normal work of breathing  Neuro: alert, grossly nonfocal, speech normal Abdomen: soft, nontender to palpation, no masses or organomegaly though obesity limits exam Ext: full grip strength bilaterally. Full strength in upper and lower extremities. Shoulders nontender to palpation. No appreciable joint swelling, warmth, or erythema. Full ROM of shoulders, elbows, wrists. No deformities.  ASSESSMENT/PLAN:  Joint pains:  -etiology unclear. Exam unremarkable -will check labs: CMET, sed rate, ANA, CRP -check xrays of shoulders and hands to rule out metastatic disease (though lower suspicion by history, warranted given history of malignancy) -consider referral to rheumatology if above workup  unremarkable  Abdominal pain: -crampy in description. No red flags but does have history of malignancy (ovarian & endometrial) however recent CT ordered by oncologist for this reason was unremarkable. Benign abdominal exam. -next step of workup is colonoscopy, which she needs regardless for routine screening. Given handout on how to schedule -continue follow up with oncology. See me in 1-2 months, sooner if needed  FOLLOW UP: Follow up in 1-2 months with me Schedule colonoscopy  [place] J. [name], [place]

## 2016-09-09 NOTE — Patient Instructions (Signed)
Checking labwork today Also getting xrays of shoulders and hands  Please schedule your colonoscopy - see the handout   Follow up with me in 1-2 months  Be well, Dr. [name]

## 2016-09-10 ENCOUNTER — Ambulatory Visit (HOSPITAL_COMMUNITY)
Admission: RE | Admit: 2016-09-10 | Discharge: 2016-09-10 | Disposition: A | Discharge status: Home / Self Care | Payer: BLUE CROSS/BLUE SHIELD | Source: Ambulatory Visit | Attending: Family Medicine | Admitting: Family Medicine

## 2016-09-10 DIAGNOSIS — M79641 Pain in right hand: Secondary | ICD-10-CM | POA: Insufficient documentation

## 2016-09-10 DIAGNOSIS — M25512 Pain in left shoulder: Secondary | ICD-10-CM | POA: Insufficient documentation

## 2016-09-10 DIAGNOSIS — M79642 Pain in left hand: Secondary | ICD-10-CM | POA: Insufficient documentation

## 2016-09-10 DIAGNOSIS — M25511 Pain in right shoulder: Secondary | ICD-10-CM | POA: Diagnosis not present

## 2016-09-10 DIAGNOSIS — M2550 Pain in unspecified joint: Secondary | ICD-10-CM

## 2016-09-10 LAB — CMP14+EGFR
A/G RATIO: 1.5 (ref 1.2–2.2)
ALT: 9 IU/L (ref 0–32)
AST: 11 IU/L (ref 0–40)
Albumin: 4.3 g/dL (ref 3.5–5.5)
Alkaline Phosphatase: 101 IU/L (ref 39–117)
BILIRUBIN TOTAL: 0.3 mg/dL (ref 0.0–1.2)
BUN / CREAT RATIO: 22 (ref 9–23)
BUN: 26 mg/dL — ABNORMAL HIGH (ref 6–24)
CALCIUM: 9.8 mg/dL (ref 8.7–10.2)
CHLORIDE: 104 mmol/L (ref 96–106)
CO2: 24 mmol/L (ref 18–29)
Creatinine, Ser: 1.2 mg/dL — ABNORMAL HIGH (ref 0.57–1.00)
GFR, EST AFRICAN AMERICAN: 60 mL/min/{1.73_m2} (ref >59)
GFR, EST NON AFRICAN AMERICAN: 52 mL/min/{1.73_m2} — AB (ref >59)
GLOBULIN, TOTAL: 2.8 g/dL (ref 1.5–4.5)
Glucose: 104 mg/dL — ABNORMAL HIGH (ref 65–99)
POTASSIUM: 5 mmol/L (ref 3.5–5.2)
SODIUM: 139 mmol/L (ref 134–144)
TOTAL PROTEIN: 7.1 g/dL (ref 6.0–8.5)

## 2016-09-10 LAB — C-REACTIVE PROTEIN: CRP: 4.7 mg/L (ref 0.0–4.9)

## 2016-09-10 LAB — ANA: Anti Nuclear Antibody(ANA): POSITIVE — AB

## 2016-09-11 ENCOUNTER — Other Ambulatory Visit: Payer: Self-pay | Admitting: Family Medicine

## 2016-09-11 DIAGNOSIS — Z1231 Encounter for screening mammogram for malignant neoplasm of breast: Secondary | ICD-10-CM

## 2016-09-19 ENCOUNTER — Telehealth: Payer: Self-pay | Admitting: Family Medicine

## 2016-09-19 DIAGNOSIS — R768 Other specified abnormal immunological findings in serum: Secondary | ICD-10-CM

## 2016-09-19 NOTE — Telephone Encounter (Signed)
Attempted to reach patient to discuss labs & xrays.  Xrays were normal. Labs showed positive ANA - which is nonspecific but could suggest some rheumatologic issue. Wanted to offer patient the option of seeing a rheumatologist vs observation vs checking additional labs for rheumatologic causes of her pain.  No answer, LVM asking her to call back. When she returns call please contact either me or Dr. Erin Hearing, who is covering for me next week, so that one of [place] can speak with her.  Thanks Leeanne Rio, MD

## 2016-09-23 DIAGNOSIS — R768 Other specified abnormal immunological findings in serum: Secondary | ICD-10-CM | POA: Insufficient documentation

## 2016-09-23 NOTE — Telephone Encounter (Signed)
Spoke to patient about elevated ANA and ESR. Nonspecific labs. Patient also had imaging of bilateral hands/shoulders that was negative. Offered Rheum referral vs Observation vs additional labs. Patient elected for Rheum referral. Will place in [facility].

## 2016-09-25 ENCOUNTER — Ambulatory Visit
Admission: RE | Admit: 2016-09-25 | Discharge: 2016-09-25 | Disposition: A | Discharge status: Home / Self Care | Payer: BLUE CROSS/BLUE SHIELD | Source: Ambulatory Visit | Attending: Family Medicine | Admitting: Family Medicine

## 2016-09-25 DIAGNOSIS — Z1231 Encounter for screening mammogram for malignant neoplasm of breast: Secondary | ICD-10-CM

## 2016-10-10 MED FILL — PANTOPRAZOLE SOD DR 40 MG T: 40 | 30 days supply | Qty: 30 | Fill #1

## 2016-11-11 ENCOUNTER — Ambulatory Visit (INDEPENDENT_AMBULATORY_CARE_PROVIDER_SITE_OTHER): Payer: BLUE CROSS/BLUE SHIELD | Admitting: Internal Medicine

## 2016-11-11 ENCOUNTER — Encounter: Payer: Self-pay | Admitting: Internal Medicine

## 2016-11-11 VITALS — BP 102/62 | HR 74 | Temp 98.1°F | Wt 372.0 lb

## 2016-11-11 DIAGNOSIS — R5383 Other fatigue: Secondary | ICD-10-CM

## 2016-11-11 LAB — POCT HEMOGLOBIN: Hemoglobin: 12.4 g/dL (ref 12.2–16.2)

## 2016-11-11 NOTE — Patient Instructions (Signed)
[name],  I suspect your symptoms are from being on a low carb diet. I would recommend increasing your carb intake to above 30 g a day. Adding some more dairy is a good way to add protein and carbs.   I will call you with your lab results, which are checking that you don't have an electrolyte imbalance.  If you still feel unwell by mid-next week, please let me know.  Best, Dr, Ola Spurr  The keto flu is a collection of symptoms experienced by some people when they first start the keto diet.  These symptoms, which can feel similar to the flu, are caused by the body adapting to a new diet consisting of very little carbohydrates.  Reducing your carb intake forces your body to burn ketones for energy instead of glucose.  Ketones are byproducts of fat breakdown and become the main fuel source when following a ketogenic diet.  Normally, fat is reserved as a secondary fuel source to use when glucose is not available.  This switch to burning fat for energy is called ketosis. It occurs during specific circumstances, including starvation and fasting (1).  However, ketosis can also be reached by adopting a very low-carb diet.  In a ketogenic diet, carbohydrates are typically reduced to under 50 grams per day (2).  This drastic reduction can come as a shock to the body and may cause withdrawal-like symptoms, similar to those experienced when weaning off an addictive substance like caffeine (3).  SUMMARY The keto flu is a term used to describe flu-like symptoms associated with beginning the very low-carb ketogenic diet. Symptoms Switching to a very low-carb diet is a major change, and your body may need time to adapt to this new way of eating.  For some people, this transition period can be especially difficult.  Signs of the keto flu may start popping up within the first few days of cutting back on carbs.  Symptoms can range from mild to severe and vary from person to  person.  While some people may transition to a ketogenic diet without any side effects, others may experience one or more of the following symptoms (4):  Nausea Vomiting Constipation Diarrhea Headache Irritability Weakness Muscle cramps Dizziness Poor concentration Stomach pain Muscle soreness Difficulty sleeping Sugar cravings These symptoms are commonly reported by those who have just begun the ketogenic diet and can be distressing.  Symptoms typically last about a week, though some people may experience them for a longer period of time.  While these side effects may cause some dieters to throw in the towel, there are ways to reduce them.  SUMMARY When beginning a ketogenic diet, some people may experience symptoms, including diarrhea, fatigue, muscle soreness and sugar cravings.  Why Do Some People Get the Keto Flu? People adapt to ketogenic diets differently. While some may experience weeks of keto-flu symptoms, others may adjust to the new diet with no adverse side effects.  The symptoms people experience are tied to how their bodies adjust to a new fuel source.  Usually, carbs provide the body with energy in the form of glucose.  When carbs are substantially reduced, the body burns ketones from fat instead of glucose.  Those who typically consume lots of carbs, especially refined carbs like pasta, sugary cereal and soda, may have a more difficult time when beginning the ketogenic diet.  Thus, the transition to a high-fat, very low-carb diet may be a struggle for some, while others are able to switch between  fuel sources easily with little to no keto-flu symptoms.  The reason some people adapt to ketogenic diets easier than others is unknown, but genetics, electrolyte loss, dehydration and carbohydrate withdrawal are believed to be the driving forces behind the keto flu.    Ways to Reduce Symptoms The keto flu can make you feel miserable.  Luckily, there are ways  to reduce its flu-like symptoms and help your body get through the transition period more easily.  Stay Hydrated Drinking enough water is necessary for optimal health and can also help reduce symptoms.  A keto diet can cause you to rapidly shed water stores, increasing the risk of dehydration (5).  This is because glycogen, the stored form of carbohydrates, binds to water in the body. When dietary carbohydrates are reduced, glycogen levels plummet and water is excreted from the body (6).  Staying hydrated can help with symptoms like fatigue and muscle cramping (7).  Replacing fluids is especially important when you are experiencing keto-flu-associated diarrhea, which can cause additional fluid loss (8).  Avoid Strenuous Exercise While exercise is important for staying healthy and keeping body weight in check, strenuous exercise should be avoided when experiencing keto-flu symptoms.  Fatigue, muscle cramps and stomach discomfort are common in the first week of following a ketogenic diet, so it may be a good idea to give your body a rest.  Activities like intense biking, running, weight lifting and strenuous workouts may have to be put on the back burner while your system adapts to new fuel sources.  While these types of exercise should be avoided if you are experiencing the keto flu, light activities like walking, yoga or leisurely biking may improve symptoms.  Replace Electrolytes Replacing dietary electrolytes may help reduce keto-flu symptoms.  When following a ketogenic diet, levels of insulin, an important hormone that helps the body absorb glucose from the bloodstream, decrease.  When insulin levels decrease, the kidneys release excess sodium from the body (9).  What's more, the keto diet restricts many foods that are high in potassium, including fruits, beans and starchy vegetables.  Getting adequate amounts of these important nutrients is an excellent way to power through the  adaptation period of the diet.  Salting food to taste and including potassium-rich, keto-friendly foods like green leafy vegetables and avocados are an excellent way to ensure you are maintaining a healthy balance of electrolytes.  These foods are also high in magnesium, which may help reduce muscle cramps, sleep issues and headaches (10).  Get Adequate Sleep Fatigue and irritability are common complaints of people who are adapting to a ketogenic diet.  Lack of sleep causes levels of the stress hormone cortisol to rise in the body, which can negatively impact mood and make keto-flu symptoms worse (11, 12).  If you are having a difficult time falling or staying asleep, try one of the following tips:  Reduce caffeine intake: Caffeine is a stimulant that may negatively impact sleep. If you drink caffeinated beverages, only do so in the morning so your sleep is not affected (13). Cut out ambient light: Shut off cell phones, computers and televisions in the bedroom to create a dark environment and promote restful sleep (14). Take a bath: Adding Epsom salt or lavender essential oil to your bath is a relaxing way to wind down and get ready for sleep (15). Get up early: Waking at the same time every day and avoiding oversleeping may help normalize your sleep patterns and improve sleep quality over time (16).  Make Sure You Are Eating Enough Fat (and Carbs) Transitioning to a very low-carb diet can cause you to crave foods that are restricted on the ketogenic diet, such as cookies, bread, pasta and bagels.  However, eating enough fat, the primary fuel source on the ketogenic diet, will help reduce cravings and keep you feeling satisfied.  In fact, research shows that low-carb diets help reduce cravings for sweets and high-carb foods (17).  Those having a difficult time adapting to the ketogenic diet may have to eliminate carbohydrates gradually, rather than all at once.  Slowly cutting back on carbs,  while increasing fat and protein in your diet, may help make the transition smoother and decrease keto-flu symptoms.  SUMMARY You can combat the keto flu by staying hydrated, replacing electrolytes, getting plenty of sleep, avoiding strenuous activities, eating enough fat and cutting out carbs slowly over time.  How Long Will It Last? Luckily, the uncomfortable symptoms of the keto flu only last about a week for most people.  However, some people may have a more difficult time adapting to this high-fat, low-carb diet.  For these individuals, symptoms may last several weeks.  Fortunately, these symptoms will gradually decrease as your body gets used to converting ketones into energy.  While keto-flu symptoms are commonly reported by those shifting to a ketogenic diet, if you are feeling particularly unwell and experiencing symptoms like prolonged diarrhea, fever or vomiting, it's best to contact your doctor to rule out other causes.  SUMMARY Some people may experience keto-flu symptoms due to genetics, electrolyte loss, dehydration and carbohydrate withdrawal. The keto flu usually lasts for about a week, but some may experience symptoms for over a month.

## 2016-11-12 LAB — BASIC METABOLIC PANEL
BUN / CREAT RATIO: 23 (ref 9–23)
BUN: 32 mg/dL — AB (ref 6–24)
CO2: 20 mmol/L (ref 20–29)
Calcium: 10.1 mg/dL (ref 8.7–10.2)
Chloride: 101 mmol/L (ref 96–106)
Creatinine, Ser: 1.4 mg/dL — ABNORMAL HIGH (ref 0.57–1.00)
GFR calc Af Amer: 50 mL/min/{1.73_m2} — ABNORMAL LOW (ref >59)
GFR calc non Af Amer: 44 mL/min/{1.73_m2} — ABNORMAL LOW (ref >59)
GLUCOSE: 113 mg/dL — AB (ref 65–99)
Potassium: 4.8 mmol/L (ref 3.5–5.2)
Sodium: 136 mmol/L (ref 134–144)

## 2016-11-13 DIAGNOSIS — R5383 Other fatigue: Secondary | ICD-10-CM | POA: Insufficient documentation

## 2016-11-13 NOTE — Progress Notes (Signed)
[name] Progress Note  Subjective:  [name] is a 51 y.o. female with history of ovarian cancer now in remission, anemia thought to be 2/2 chemotherapy, obesity and HTN. She presents for increased fatigue.  #Increased fatigue: - Noted since mid-last week. Associated symptoms of tossing and turning at night, occasional headache, feeling sluggish in am and feels that she gets more winded with exertion the last couple of days and lower energy - Has been on a low-carb diet since July 9th. Is not eating rice, pasta or bread. May have a piece of toast "every now and again." Mostly having meat and vegetables. - Does not feel short of breath at rest - s/p hysterectomy - Had some vomiting after going out to eat last Wednesday but none since then - Has been urinating more since starting diet but reports drinking a lot of water - Has intentionally lost about 10 lbs over the last month ROS: No chest pain, no diarrhea, no dark stools, no fevers, no falls  Social: Never smoker  Allergies  Allergen Reactions  . Penicillins Rash    Has patient had a PCN reaction causing immediate rash, facial/tongue/throat swelling, SOB or lightheadedness with hypotension: no Has patient had a PCN reaction causing severe rash involving mucus membranes or skin necrosis: no Has patient had a PCN reaction that required hospitalization in the hospital at the time Has patient had a PCN reaction occurring within the last 10 years: no If all of the above answers are "NO", then may proceed with Cephalosporin use.     Objective: Blood pressure 102/62, pulse 74, temperature 98.1 F (36.7 C), temperature source Oral, weight (!) 372 lb (168.7 kg), last menstrual period 10/26/2013, SpO2 97 %. Body mass index is 61.9 kg/m. Constitutional: Morbidly obese female, in NAD HENT: MMM Cardiovascular: RRR, S1, S2, no m/r/g.  Pulmonary/Chest: Effort normal and breath sounds normal. No respiratory  distress.  Abdominal: Soft. +BS, NT, ND Musculoskeletal: No LE edema Neurological: AOx3, no focal deficits. Skin: Skin is warm and dry. No rash noted.  Psychiatric: Normal mood and affect.  Vitals reviewed  Hct 12.4  Assessment/Plan: Fatigue - Differential includes low energy due to low-carb diet, may have viral illness with vomiting last week but since resolved, and anemia given history of such. Reports some SOB with exertion but no CP and SpO2 97% to suggest against cardiac cause or PE (history of clots in setting of cancer). Hct wnl today.  - Will check BMP to look for electrolyte abnormalities. - Encouraged increased fluid intake and adding a little more carbohydrates into her diet--such as a serving of dairy. - Provided handout on "keto flu" - Would check TSH if symptoms not improved by adjusting diet somewhat  Follow-up if not feeling better by next week.   Olene Floss, MD [place], PGY-3

## 2016-11-13 NOTE — Assessment & Plan Note (Signed)
-   Differential includes low energy due to low-carb diet, may have viral illness with vomiting last week but since resolved, and anemia given history of such. Reports some SOB with exertion but no CP and SpO2 97% to suggest against cardiac cause or PE (history of clots in setting of cancer). Hct wnl today.  - Will check BMP to look for electrolyte abnormalities. - Encouraged increased fluid intake and adding a little more carbohydrates into her diet--such as a serving of dairy. - Provided handout on "keto flu" - Would check TSH if symptoms not improved by adjusting diet somewhat

## 2016-11-17 MED FILL — PANTOPRAZOLE SOD DR 40 MG T: 40 | 30 days supply | Qty: 30 | Fill #2

## 2016-11-19 ENCOUNTER — Ambulatory Visit (INDEPENDENT_AMBULATORY_CARE_PROVIDER_SITE_OTHER): Payer: BLUE CROSS/BLUE SHIELD | Admitting: Licensed Clinical Social Worker

## 2016-11-19 DIAGNOSIS — F439 Reaction to severe stress, unspecified: Secondary | ICD-10-CM

## 2016-11-19 NOTE — Progress Notes (Signed)
Total time:45 minutes Type of Service: Integrated Behavioral Health Visit  Interpretor:No.    SUBJECTIVE: [name] is a 51 y.o. female, self referral for: managing stress.   Patient reports the following concerns: wants to avoid becoming depressed due to current stressors related to finances and chronic health issues.   LIFE CONTEXT:  Family & Social: lives with daughter, son-in-law and grandchildren.   School/ Work: unable to work, has applied for disability, receives [employer]  Life changes: denied disability, chronic medical concerns.  GOALS:  Patient will reduce symptoms of: stress, , and increase knowledge and/or ability FX:OVANVB skills, healthy habits, self-management skills and stress reduction,   Intervention: Reflective listening, Motivational Interviewing,Community Resource and Problem-solving teaching/coping strategies   Issues discussed: behaviors changes, current stressors, community resources, managing chronic medical conditions, barriers to medical and other appointments.    ASSESSMENT:Patient currently experiencing stress associated with chronic medical conditions and financial stressors.  Patient may benefit from, and is in agreement to receive further assessment and brief therapeutic interventions to assist with managing stress.   PLAN: 1. Patient will return to see LCSW in 2 weeks if needed 2. Patient will complete [employer] application for transportation 3. Patient will complete problem solving homework assignment (talking Points) 4. Patient will call LCSW if needed.  [name], LCSW Licensed Clinical Social Worker [place]   [phone] 10:33 AM

## 2016-11-23 ENCOUNTER — Emergency Department (HOSPITAL_COMMUNITY): Payer: BLUE CROSS/BLUE SHIELD

## 2016-11-23 ENCOUNTER — Encounter (HOSPITAL_COMMUNITY): Payer: Self-pay | Admitting: Emergency Medicine

## 2016-11-23 ENCOUNTER — Inpatient Hospital Stay (HOSPITAL_COMMUNITY)
Admission: EM | Admit: 2016-11-23 | Discharge: 2016-11-25 | DRG: 299 | Disposition: A | Discharge status: Home / Self Care | Payer: BLUE CROSS/BLUE SHIELD | Attending: Internal Medicine | Admitting: Internal Medicine

## 2016-11-23 ENCOUNTER — Emergency Department (HOSPITAL_BASED_OUTPATIENT_CLINIC_OR_DEPARTMENT_OTHER)
Admission: EM | Admit: 2016-11-23 | Discharge: 2016-11-23 | Disposition: A | Discharge status: Home / Self Care | Payer: BLUE CROSS/BLUE SHIELD | Source: Home / Self Care | Attending: Emergency Medicine | Admitting: Emergency Medicine

## 2016-11-23 DIAGNOSIS — I82412 Acute embolism and thrombosis of left femoral vein: Principal | ICD-10-CM | POA: Diagnosis present

## 2016-11-23 DIAGNOSIS — I129 Hypertensive chronic kidney disease with stage 1 through stage 4 chronic kidney disease, or unspecified chronic kidney disease: Secondary | ICD-10-CM | POA: Diagnosis present

## 2016-11-23 DIAGNOSIS — Z86718 Personal history of other venous thrombosis and embolism: Secondary | ICD-10-CM | POA: Diagnosis not present

## 2016-11-23 DIAGNOSIS — D509 Iron deficiency anemia, unspecified: Secondary | ICD-10-CM | POA: Diagnosis present

## 2016-11-23 DIAGNOSIS — N183 Chronic kidney disease, stage 3 unspecified: Secondary | ICD-10-CM | POA: Diagnosis present

## 2016-11-23 DIAGNOSIS — M79609 Pain in unspecified limb: Secondary | ICD-10-CM

## 2016-11-23 DIAGNOSIS — I2699 Other pulmonary embolism without acute cor pulmonale: Secondary | ICD-10-CM | POA: Diagnosis present

## 2016-11-23 DIAGNOSIS — Z79899 Other long term (current) drug therapy: Secondary | ICD-10-CM

## 2016-11-23 DIAGNOSIS — I82402 Acute embolism and thrombosis of unspecified deep veins of left lower extremity: Secondary | ICD-10-CM | POA: Diagnosis not present

## 2016-11-23 DIAGNOSIS — I10 Essential (primary) hypertension: Secondary | ICD-10-CM | POA: Diagnosis present

## 2016-11-23 DIAGNOSIS — I824Z2 Acute embolism and thrombosis of unspecified deep veins of left distal lower extremity: Secondary | ICD-10-CM

## 2016-11-23 DIAGNOSIS — E876 Hypokalemia: Secondary | ICD-10-CM | POA: Diagnosis present

## 2016-11-23 DIAGNOSIS — I82442 Acute embolism and thrombosis of left tibial vein: Secondary | ICD-10-CM | POA: Diagnosis present

## 2016-11-23 DIAGNOSIS — D500 Iron deficiency anemia secondary to blood loss (chronic): Secondary | ICD-10-CM | POA: Diagnosis present

## 2016-11-23 DIAGNOSIS — Z880 Allergy status to penicillin: Secondary | ICD-10-CM

## 2016-11-23 DIAGNOSIS — K219 Gastro-esophageal reflux disease without esophagitis: Secondary | ICD-10-CM | POA: Diagnosis present

## 2016-11-23 DIAGNOSIS — K589 Irritable bowel syndrome without diarrhea: Secondary | ICD-10-CM | POA: Diagnosis present

## 2016-11-23 DIAGNOSIS — I82432 Acute embolism and thrombosis of left popliteal vein: Secondary | ICD-10-CM | POA: Diagnosis present

## 2016-11-23 DIAGNOSIS — O2230 Deep phlebothrombosis in pregnancy, unspecified trimester: Secondary | ICD-10-CM | POA: Diagnosis present

## 2016-11-23 DIAGNOSIS — Z86711 Personal history of pulmonary embolism: Secondary | ICD-10-CM | POA: Diagnosis present

## 2016-11-23 DIAGNOSIS — Z8542 Personal history of malignant neoplasm of other parts of uterus: Secondary | ICD-10-CM

## 2016-11-23 DIAGNOSIS — Z8543 Personal history of malignant neoplasm of ovary: Secondary | ICD-10-CM

## 2016-11-23 HISTORY — DX: Deep phlebothrombosis in pregnancy, unspecified trimester: O22.30

## 2016-11-23 HISTORY — DX: Acute embolism and thrombosis of unspecified deep veins of unspecified lower extremity: I82.409

## 2016-11-23 LAB — COMPREHENSIVE METABOLIC PANEL
ALK PHOS: 73 U/L (ref 38–126)
ALT: 12 U/L — AB (ref 14–54)
AST: 14 U/L — ABNORMAL LOW (ref 15–41)
Albumin: 3.4 g/dL — ABNORMAL LOW (ref 3.5–5.0)
Anion gap: 9 (ref 5–15)
BUN: 23 mg/dL — ABNORMAL HIGH (ref 6–20)
CALCIUM: 9.6 mg/dL (ref 8.9–10.3)
CO2: 20 mmol/L — AB (ref 22–32)
CREATININE: 1.17 mg/dL — AB (ref 0.44–1.00)
Chloride: 111 mmol/L (ref 101–111)
GFR calc Af Amer: >60 mL/min (ref >60)
GFR calc non Af Amer: 53 mL/min — ABNORMAL LOW (ref >60)
GLUCOSE: 115 mg/dL — AB (ref 65–99)
Potassium: 4.5 mmol/L (ref 3.5–5.1)
SODIUM: 140 mmol/L (ref 135–145)
Total Bilirubin: 0.5 mg/dL (ref 0.3–1.2)
Total Protein: 7 g/dL (ref 6.5–8.1)

## 2016-11-23 LAB — PROTIME-INR
INR: 1.18
Prothrombin Time: 15 seconds (ref 11.4–15.2)

## 2016-11-23 LAB — CBC
HCT: 35.9 % — ABNORMAL LOW (ref 36.0–46.0)
HEMOGLOBIN: 11.9 g/dL — AB (ref 12.0–15.0)
MCH: 29.8 pg (ref 26.0–34.0)
MCHC: 33.1 g/dL (ref 30.0–36.0)
MCV: 89.8 fL (ref 78.0–100.0)
Platelets: 166 10*3/uL (ref 150–400)
RBC: 4 MIL/uL (ref 3.87–5.11)
RDW: 13.6 % (ref 11.5–15.5)
WBC: 11.9 10*3/uL — ABNORMAL HIGH (ref 4.0–10.5)

## 2016-11-23 LAB — TROPONIN I

## 2016-11-23 LAB — APTT: aPTT: 38 seconds — ABNORMAL HIGH (ref 24–36)

## 2016-11-23 MED ORDER — BISACODYL 5 MG PO TBEC: 5.0000 mg | DELAYED_RELEASE_TABLET | Freq: Every day | ORAL | Status: DC | PRN

## 2016-11-23 MED ORDER — LORATADINE 10 MG PO TABS: 10.0000 mg | ORAL_TABLET | Freq: Every day | ORAL | Status: DC | PRN

## 2016-11-23 MED ORDER — SODIUM CHLORIDE 0.9% FLUSH
3.0000 mL | Freq: Two times a day (BID) | INTRAVENOUS | Status: DC
  Administered 2016-11-23: 3 mL via INTRAVENOUS

## 2016-11-23 MED ORDER — SODIUM CHLORIDE 0.9 % IV SOLN: 250.0000 mL | INTRAVENOUS | Status: DC | PRN

## 2016-11-23 MED ORDER — ACETAMINOPHEN 650 MG RE SUPP: 650.0000 mg | Freq: Four times a day (QID) | RECTAL | Status: DC | PRN

## 2016-11-23 MED ORDER — PANTOPRAZOLE SODIUM 40 MG PO TBEC
40.0000 mg | DELAYED_RELEASE_TABLET | Freq: Every day | ORAL | Status: DC
  Administered 2016-11-24 – 2016-11-25 (×2): 40 mg via ORAL
  Filled 2016-11-23 (×2): qty 1

## 2016-11-23 MED ORDER — HYDROCODONE-ACETAMINOPHEN 5-325 MG PO TABS
2.0000 | ORAL_TABLET | Freq: Once | ORAL | Status: AC
  Administered 2016-11-23: 2 via ORAL

## 2016-11-23 MED ORDER — ONDANSETRON HCL 4 MG/2ML IJ SOLN: 4.0000 mg | Freq: Four times a day (QID) | INTRAMUSCULAR | Status: DC | PRN

## 2016-11-23 MED ORDER — ENOXAPARIN SODIUM 150 MG/ML ~~LOC~~ SOLN
1.0000 mg/kg | Freq: Two times a day (BID) | SUBCUTANEOUS | Status: DC
  Administered 2016-11-23 – 2016-11-24 (×2): 170 mg via SUBCUTANEOUS
  Filled 2016-11-23 (×5): qty 1.12

## 2016-11-23 MED ORDER — SODIUM CHLORIDE 0.9% FLUSH: 3.0000 mL | INTRAVENOUS | Status: DC | PRN

## 2016-11-23 MED ORDER — IOPAMIDOL (ISOVUE-370) INJECTION 76%
INTRAVENOUS | Status: AC
  Administered 2016-11-23: 60 mL via INTRAVENOUS
  Filled 2016-11-23: qty 100

## 2016-11-23 MED ORDER — ONDANSETRON HCL 4 MG PO TABS: 4.0000 mg | ORAL_TABLET | Freq: Four times a day (QID) | ORAL | Status: DC | PRN

## 2016-11-23 MED ORDER — ACETAMINOPHEN 325 MG PO TABS
650.0000 mg | ORAL_TABLET | Freq: Four times a day (QID) | ORAL | Status: DC | PRN
  Administered 2016-11-24 – 2016-11-25 (×2): 650 mg via ORAL
  Filled 2016-11-23 (×2): qty 2

## 2016-11-23 MED ORDER — SODIUM CHLORIDE 0.9% FLUSH
10.0000 mL | INTRAVENOUS | Status: DC | PRN
  Administered 2016-11-25 (×2): 10 mL
  Filled 2016-11-23 (×2): qty 40

## 2016-11-23 MED ORDER — SENNOSIDES-DOCUSATE SODIUM 8.6-50 MG PO TABS: 1.0000 | ORAL_TABLET | Freq: Every evening | ORAL | Status: DC | PRN

## 2016-11-23 MED ORDER — HYDROCODONE-ACETAMINOPHEN 5-325 MG PO TABS
1.0000 | ORAL_TABLET | ORAL | Status: DC | PRN
  Administered 2016-11-24: 1 via ORAL
  Administered 2016-11-24 – 2016-11-25 (×3): 2 via ORAL
  Filled 2016-11-23 (×5): qty 2

## 2016-11-23 MED ORDER — TRIAMCINOLONE ACETONIDE 0.1 % EX CREA
TOPICAL_CREAM | Freq: Two times a day (BID) | CUTANEOUS | Status: DC
  Administered 2016-11-24 – 2016-11-25 (×3): via TOPICAL
  Filled 2016-11-23: qty 15

## 2016-11-23 NOTE — H&P (Signed)
History and Physical    [name] CHE:[id number] DOB: 1965-11-13 DOA: 11/23/2016  PCP: Alveda Reasons, MD   Patient coming from: Home  Chief Complaint: Fatigue, malaise, SOB, left leg swelling and pain   HPI: [name] is a 51 y.o. female with medical history significant for ovarian cancer in remission, history of DVT status-post 6 months of Lovenox, chronic kidney disease stage III, and GERD, now presenting to the emergency department for evaluation of left lower extremity pain and swelling, shortness of breath, malaise, and fatigue the past couple weeks. Patient reports that she been in her usual state until couple weeks ago when she noted the insidious development of fatigue and malaise, as well as pain throughout the entire left lower extremity. Over the ensuing days, the symptoms worsened, she noted some increased swelling in her left lower extremity, and she developed some shortness of breath. Denies any significant chest pain or cough. She was evaluated by her primary care physician early in the course of this illness, basic blood work reportedly demonstrated hypokalemia and other electrolyte abnormalities, and the patient reportedly made some dietary changes. She continued to feel poorly despite her electrolytes normalizing.  ED Course: Upon arrival to the ED, patient is found to be afebrile, saturating well on room air, and with vitals otherwise stable. Chest x-ray is negative for acute pulmonary disease. Chemistry panels notable for a creatinine 1.17, better than recent prior. CBC is notable for a leukocytosis to 11,900 and a stable normocytic anemia with hemoglobin of 11.9. Ultrasound of bilateral lower extremities demonstrates acute DVT in the left femoral, popliteal, posterior tibial, and peroneal veins. CTA chest demonstrates extensive pulmonary emboli bilaterally without evidence for right heart strain. Patient was started on therapeutic Lovenox in the ED. She remained  hemodynamically stable and not in acute respiratory distress. She will be observed on the telemetry unit for ongoing evaluation and management of fatigue, malaise, shortness of breath, and left leg pain secondary to acute left lower extremity DVT and bilateral PE.  Review of Systems:  All other systems reviewed and apart from HPI, are negative.  Past Medical History:  Diagnosis Date  . Anemia   . Cough   . Diverticulitis with perforation 2010  . DVT, lower extremity ([place])   . Endometrial cancer ([place])   . Family history of adverse reaction to anesthesia    pts daughter had difficulty awakening following anesthesia   . GERD (gastroesophageal reflux disease)   . Headache   . History of bronchitis   . History of ear infections   . Hx of migraines   . Hypertension   . IBS (irritable bowel syndrome)   . Kidney infection   . Morbid obesity ([place])   . Ovarian cancer ([place]) dx'd 01/2015  . Pyelonephritis     Past Surgical History:  Procedure Laterality Date  . ABDOMINAL HYSTERECTOMY N/A 03/13/2015   Procedure: TOTAL HYSTERECTOMY ABDOMINAL BILATERAL SALPINGO OOPHORECTOMY RADICAL TUMOR [place];  Surgeon: Everitt Amber, MD;  Location: [facility];  Service: Gynecology;  Laterality: N/A;  . BOWEL RESECTION  03/13/2015   Procedure: SMALL BOWEL RESECTION;  Surgeon: Everitt Amber, MD;  Location: [facility];  Service: Gynecology;;  . [place] and 1984  . COLOSTOMY  2008  . COLOSTOMY TAKEDOWN    . LAPAROTOMY N/A 03/13/2015   Procedure: EXPLORATORY LAPAROTOMY;  Surgeon: Everitt Amber, MD;  Location: [facility];  Service: Gynecology;  Laterality: N/A;  . TONSILLECTOMY AND ADENOIDECTOMY  1997  . VENTRAL  HERNIA REPAIR  03/13/2015   Procedure: HERNIA REPAIR VENTRAL ADULT;  Surgeon: Everitt Amber, MD;  Location: [facility];  Service: Gynecology;;     reports that she has never smoked. She has never used smokeless tobacco. She reports that she drinks alcohol. She reports that she does not use drugs.  Allergies    Allergen Reactions  . Penicillins Rash    Has patient had a PCN reaction causing immediate rash, facial/tongue/throat swelling, SOB or lightheadedness with hypotension: no Has patient had a PCN reaction causing severe rash involving mucus membranes or skin necrosis: no Has patient had a PCN reaction that required hospitalization in the hospital at the time Has patient had a PCN reaction occurring within the last 10 years: no If all of the above answers are "NO", then may proceed with Cephalosporin use.     Family History  Problem Relation Age of Onset  . Diabetes Mother   . Hypertension Mother   . Hyperlipidemia Mother   . Colon polyps Mother        4 total  . Fibroids Mother        s/p hysterectomy  . Hyperlipidemia Father   . Diabetes Daughter   . Hodgkin's lymphoma Daughter 70  . Asthma Maternal Aunt        severe - d. 59  . Prostate cancer Maternal Uncle 63  . Diabetes Paternal Aunt   . Diabetes Paternal Uncle   . Kidney failure Maternal Grandmother 84  . Pancreatic cancer Paternal Grandmother        dx. >50  . Diabetes Paternal Grandfather   . Diabetes Paternal Aunt   . Pancreatic cancer Cousin 37       paternal 1st cousin  . Heart disease Neg Hx   . Stroke Neg Hx      Prior to Admission medications   Medication Sig Start Date End Date Taking? Authorizing Provider  acetaminophen (TYLENOL) 500 MG tablet Take 1,000 mg by mouth every 6 (six) hours as needed for mild pain, moderate pain, fever or headache. Reported on 09/10/2015    [provider]  cetirizine (ZYRTEC) 10 MG tablet Take 1 tablet (10 mg total) by mouth daily as needed for allergies (itching). 01/22/16   Leeanne Rio, MD  gabapentin (NEURONTIN) 100 MG capsule Take 100 mg by mouth at bedtime as needed.     [provider]  lidocaine-prilocaine (EMLA) cream Apply to PAC site 1-2 hours prior to access as needed. 10/11/15   Livesay, Lennis P, MD  pantoprazole (PROTONIX) 40 MG tablet Take  1 tablet (40 mg total) by mouth daily. 08/27/16   Leeanne Rio, MD  triamcinolone cream (KENALOG) 0.1 % APPLY TO THE AFFECTED AREA(S) TWICE DAILY AS NEEDED 08/27/16   Leeanne Rio, MD    Physical Exam: Vitals:   11/23/16 1430 11/23/16 1841  BP: (!) 142/70 134/81  Pulse: 82 85  Resp: 16 (!) 26  Temp: 98.7 F (37.1 C) 98.7 F (37.1 C)  TempSrc: Oral Oral  SpO2: 98% 100%      Constitutional: NAD, calm, obese Eyes: PERTLA, lids and conjunctivae normal ENMT: Mucous membranes are moist. Posterior pharynx clear of any exudate or lesions.   Neck: normal, supple, no masses, no thyromegaly Respiratory: clear to auscultation bilaterally, no wheezing, no crackles. Normal respiratory effort.   Cardiovascular: S1 & S2 heard, regular rate and rhythm. Bilateral LE edema, left >right. JVP not well-visualized. Abdomen: No distension, no tenderness, no masses palpated. Bowel sounds  normal.  Musculoskeletal: no clubbing / cyanosis. No joint deformity upper and lower extremities.   Skin: no significant rashes, lesions, ulcers. Warm, dry, well-perfused. Neurologic: CN 2-12 grossly intact. Sensation intact, DTR normal. Strength 5/5 in all 4 limbs.  Psychiatric:  Alert and oriented x 3. Pleasant, cooperative.     Labs on Admission: I have personally reviewed following labs and imaging studies  CBC:  Recent Labs Lab 11/23/16 1640  WBC 11.9*  HGB 11.9*  HCT 35.9*  MCV 89.8  PLT 607   Basic Metabolic Panel:  Recent Labs Lab 11/23/16 1640  NA 140  K 4.5  CL 111  CO2 20*  GLUCOSE 115*  BUN 23*  CREATININE 1.17*  CALCIUM 9.6   GFR: Estimated Creatinine Clearance: 91.3 mL/min (A) (by C-G formula based on SCr of 1.17 mg/dL (H)). Liver Function Tests:  Recent Labs Lab 11/23/16 1640  AST 14*  ALT 12*  ALKPHOS 73  BILITOT 0.5  PROT 7.0  ALBUMIN 3.4*   No results for input(s): LIPASE, AMYLASE in the last 168 hours. No results for input(s): AMMONIA in the last 168  hours. Coagulation Profile:  Recent Labs Lab 11/23/16 1640  INR 1.18   Cardiac Enzymes: No results for input(s): CKTOTAL, CKMB, CKMBINDEX, TROPONINI in the last 168 hours. BNP (last 3 results) No results for input(s): PROBNP in the last 8760 hours. HbA1C: No results for input(s): HGBA1C in the last 72 hours. CBG: No results for input(s): GLUCAP in the last 168 hours. Lipid Profile: No results for input(s): CHOL, HDL, LDLCALC, TRIG, CHOLHDL, LDLDIRECT in the last 72 hours. Thyroid Function Tests: No results for input(s): TSH, T4TOTAL, FREET4, T3FREE, THYROIDAB in the last 72 hours. Anemia Panel: No results for input(s): VITAMINB12, FOLATE, FERRITIN, TIBC, IRON, RETICCTPCT in the last 72 hours. Urine analysis:    Component Value Date/Time   COLORURINE AMBER (A) 03/14/2015 2118   APPEARANCEUR CLOUDY (A) 03/14/2015 2118   LABSPEC 1.015 04/23/2015 1247   PHURINE 5.0 04/23/2015 1247   PHURINE 5.5 03/14/2015 2118   GLUCOSEU Negative 04/23/2015 1247   HGBUR Negative 04/23/2015 1247   HGBUR MODERATE (A) 03/14/2015 2118   BILIRUBINUR Color Interference 04/23/2015 1247   KETONESUR Negative 04/23/2015 1247   KETONESUR NEGATIVE 03/14/2015 2118   PROTEINUR Color Interference 04/23/2015 1247   PROTEINUR NEGATIVE 03/14/2015 2118   UROBILINOGEN Color Interference 04/23/2015 1247   NITRITE Color Interference 04/23/2015 1247   NITRITE NEGATIVE 03/14/2015 2118   LEUKOCYTESUR Color Interference 04/23/2015 1247   Sepsis Labs: @LABRCNTIP (procalcitonin:4,lacticidven:4) )No results found for this or any previous visit (from the past 240 hour(s)).   Radiological Exams on Admission: Dg Chest 2 View  Result Date: 11/23/2016 CLINICAL DATA:  Fever for several days. EXAM: CHEST  2 VIEW COMPARISON:  March 07, 2015 FINDINGS: The right Port-A-Cath is in good position. No pneumothorax. The cardiomediastinal silhouette is unchanged. No pulmonary nodules, masses, or focal infiltrates. IMPRESSION: No  active cardiopulmonary disease. Electronically Signed   By: Dorise Bullion III M.D   On: 11/23/2016 15:39   Ct Angio Chest Pe W And/or Wo Contrast  Result Date: 11/23/2016 CLINICAL DATA:  Fever and chest pain. History of prior pulmonary embolus EXAM: CT ANGIOGRAPHY CHEST WITH CONTRAST TECHNIQUE: Multidetector CT imaging of the chest was performed using the standard protocol during bolus administration of intravenous contrast. Multiplanar CT image reconstructions and MIPs were obtained to evaluate the vascular anatomy. CONTRAST:  60 mL Isovue 370 nonionic COMPARISON:  CT angiogram chest Aug 23, 2015; chest  radiograph November 23, 2016 FINDINGS: Cardiovascular: There is extensive pulmonary embolus. Pulmonary embolus arises in the midportion main right pulmonary artery with extension of pulmonary embolus into multiple right lower lobe pulmonary artery branches. On the left, there is pulmonary embolus arising from the distal main pulmonary artery extending into several lower lobe pulmonary arteries as well as into the proximal left upper lobe pulmonary artery. The main pulmonary outflow tract measures 3.3 cm, a finding likely indicative of pulmonary arterial hypertension. The right ventricle to left ventricle diameter ratio is less than 0.9, a finding not indicative of right heart strain. Central catheter tip is at the cavoatrial junction. There is no appreciable thoracic aortic aneurysm or dissection. The visualized great vessels appear normal. Pericardium is not appreciably thickened. Mediastinum/Nodes: The visualized thyroid appears normal. There is no appreciable thoracic adenopathy. There is a focal hiatal hernia. Lungs/Pleura: There is no appreciable edema or consolidation. No pulmonary infarct is demonstrable on this study. There is mild atelectasis in the left upper lobe. There is no pleural effusion or pleural thickening evident. Upper Abdomen: Visualized upper abdominal structures appear unremarkable except  for mild left adrenal hypertrophy. Musculoskeletal: There is degenerative change in the thoracic spine. There are no blastic or lytic bone lesions. Review of the MIP images confirms the above findings. IMPRESSION: 1. Extensive pulmonary embolism bilaterally without right heart strain demonstrated. 2. Prominence of the main pulmonary outflow tract, likely indicative of pulmonary arterial hypertension. 3.  No appreciable thoracic aortic aneurysm or dissection. 4. Slight atelectasis left upper lobe. No lung edema or consolidation. No pleural effusion. No pulmonary infarct evident. 5.  No demonstrable adenopathy. 6.  Mild left adrenal hypertrophy. 7.  Focal hiatal hernia. Critical Value/emergent results were called by telephone at the time of interpretation on 11/23/2016 at 6:22 pm to Dr. Dorie Rank , who verbally acknowledged these results. Electronically Signed   By: Lowella Grip III M.D.   On: 11/23/2016 18:23    EKG: Ordered and pending.   Assessment/Plan  1. Acute LLE DVT and bilateral PE - Pt presents with 2 wks of progressive LLE pain, SOB, and fatigue  - She is found to have acute DVT throughout LLE and extensive bilateral PE without CT-evidence for heart-strain  - She was started on therapeutic Lovenox in ED  - No hypoxia or hypotension  - Plan to continue anticoagulation, obtain serial troponin measurements, echocardiogram, case management consult for assistance with discharge medications, supportive care with prn analgesia and supplemental O2   2. CKD stage III  - SCr is 1.17 on admission, better than recent priors  - Avoid hypotension or nephrotoxins, renally-dose medications as needed  - Repeat chem panel in am    3. GERD  - No EGD report on file  - Managed at home with daily PPI, will continue     DVT prophylaxis: Lovenox, treatment-dose Code Status: Full  Family Communication: Discussed with patient Disposition Plan: Observe on telemetry Consults called: None Admission  status: Observation    Vianne Bulls, MD [facility] Pager [phone]  If 7PM-7AM, please contact night-coverage [email or web address] Password [email or web address]  11/23/2016, 7:13 PM

## 2016-11-23 NOTE — ED Provider Notes (Signed)
[facility] DEPT Provider Note   CSN: [id number] Arrival date & time: 11/23/16  1347     History   Chief Complaint Chief Complaint  Patient presents with  . Leg Pain  . Fever    HPI MAXIE DEBOSE is a 51 y.o. female.  HPI Pt has not been feeling well the last couple of weeks.  She has been short of breath and has had no energy.  She saw her doctor and was told her electrolytes were abnormal. Pt states she started having pain in her leg a few days ago.  The pain is in the left leg from the knee up.  She feels a tightness.  It hurts to move.  She has noticed some mild redness.  Past Medical History:  Diagnosis Date  . Anemia   . Cough   . Diverticulitis with perforation 2010  . DVT, lower extremity ([place])   . Endometrial cancer ([place])   . Family history of adverse reaction to anesthesia    pts daughter had difficulty awakening following anesthesia   . GERD (gastroesophageal reflux disease)   . Headache   . History of bronchitis   . History of ear infections   . Hx of migraines   . Hypertension   . IBS (irritable bowel syndrome)   . Kidney infection   . Morbid obesity ([place])   . Ovarian cancer ([place]) dx'd 01/2015  . Pyelonephritis       Past Surgical History:  Procedure Laterality Date  . ABDOMINAL HYSTERECTOMY N/A 03/13/2015   Procedure: TOTAL HYSTERECTOMY ABDOMINAL BILATERAL SALPINGO OOPHORECTOMY RADICAL TUMOR [place];  Surgeon: Everitt Amber, MD;  Location: [facility]S;  Service: Gynecology;  Laterality: N/A;  . BOWEL RESECTION  03/13/2015   Procedure: SMALL BOWEL RESECTION;  Surgeon: Everitt Amber, MD;  Location: [facility];  Service: Gynecology;;  . [place] and 1984  . COLOSTOMY  2008  . COLOSTOMY TAKEDOWN    . LAPAROTOMY N/A 03/13/2015   Procedure: EXPLORATORY LAPAROTOMY;  Surgeon: Everitt Amber, MD;  Location: [facility];  Service: Gynecology;  Laterality: N/A;  . TONSILLECTOMY AND ADENOIDECTOMY  1997  . VENTRAL HERNIA REPAIR  03/13/2015   Procedure: HERNIA  REPAIR VENTRAL ADULT;  Surgeon: Everitt Amber, MD;  Location: [facility];  Service: Gynecology;;    OB History    No data available       Home Medications    Prior to Admission medications   Medication Sig Start Date End Date Taking? Authorizing Provider  acetaminophen (TYLENOL) 500 MG tablet Take 1,000 mg by mouth every 6 (six) hours as needed for mild pain, moderate pain, fever or headache. Reported on 09/10/2015    [provider]  cetirizine (ZYRTEC) 10 MG tablet Take 1 tablet (10 mg total) by mouth daily as needed for allergies (itching). 01/22/16   Leeanne Rio, MD  gabapentin (NEURONTIN) 100 MG capsule Take 100 mg by mouth at bedtime as needed.     [provider]  lidocaine-prilocaine (EMLA) cream Apply to PAC site 1-2 hours prior to access as needed. 10/11/15   Livesay, Lennis P, MD  pantoprazole (PROTONIX) 40 MG tablet Take 1 tablet (40 mg total) by mouth daily. 08/27/16   Leeanne Rio, MD  triamcinolone cream (KENALOG) 0.1 % APPLY TO THE AFFECTED AREA(S) TWICE DAILY AS NEEDED 08/27/16   Leeanne Rio, MD    Family History Family History  Problem Relation Age of Onset  . Diabetes Mother   . Hypertension Mother   .  Hyperlipidemia Mother   . Colon polyps Mother        4 total  . Fibroids Mother        s/p hysterectomy  . Hyperlipidemia Father   . Diabetes Daughter   . Hodgkin's lymphoma Daughter 68  . Asthma Maternal Aunt        severe - d. 31  . Prostate cancer Maternal Uncle 74  . Diabetes Paternal Aunt   . Diabetes Paternal Uncle   . Kidney failure Maternal Grandmother 84  . Pancreatic cancer Paternal Grandmother        dx. >50  . Diabetes Paternal Grandfather   . Diabetes Paternal Aunt   . Pancreatic cancer Cousin 4       paternal 1st cousin  . Heart disease Neg Hx   . Stroke Neg Hx     Social History Social History  Substance Use Topics  . Smoking status: Never Smoker  . Smokeless tobacco: Never Used  . Alcohol use 0.0  oz/week     Comment: Maybe wine every 6 months     Allergies   Penicillins   Review of Systems Review of Systems  Constitutional: Positive for fever.  Respiratory: Positive for cough and shortness of breath.   Cardiovascular: Negative for chest pain.  All other systems reviewed and are negative.    Physical Exam Updated Vital Signs BP (!) 142/70 (BP Location: Left Arm)   Pulse 82   Temp 98.7 F (37.1 C) (Oral)   Resp 16   LMP 10/26/2013   SpO2 98%   Physical Exam  Constitutional: She appears well-developed and well-nourished. No distress.  HENT:  Head: Normocephalic and atraumatic.  Right Ear: External ear normal.  Left Ear: External ear normal.  Eyes: Conjunctivae are normal. Right eye exhibits no discharge. Left eye exhibits no discharge. No scleral icterus.  Neck: Neck supple. No tracheal deviation present.  Cardiovascular: Normal rate, regular rhythm and intact distal pulses.   Pulmonary/Chest: Effort normal and breath sounds normal. No stridor. No respiratory distress. She has no wheezes. She has no rales.  Abdominal: Soft. Bowel sounds are normal. She exhibits no distension. There is no tenderness. There is no rebound and no guarding.  Musculoskeletal: She exhibits no edema or tenderness.  Neurological: She is alert. She has normal strength. No cranial nerve deficit (no facial droop, extraocular movements intact, no slurred speech) or sensory deficit. She exhibits normal muscle tone. She displays no seizure activity. Coordination normal.  Skin: Skin is warm and dry. No rash noted.  Psychiatric: She has a normal mood and affect.  Nursing note and vitals reviewed.    ED Treatments / Results  Labs (all labs ordered are listed, but only abnormal results are displayed) Labs Reviewed  CBC - Abnormal; Notable for the following:       Result Value   WBC 11.9 (*)    Hemoglobin 11.9 (*)    HCT 35.9 (*)    All other components within normal limits  COMPREHENSIVE  METABOLIC PANEL - Abnormal; Notable for the following:    CO2 20 (*)    Glucose, Bld 115 (*)    BUN 23 (*)    Creatinine, Ser 1.17 (*)    Albumin 3.4 (*)    AST 14 (*)    ALT 12 (*)    GFR calc non Af Amer 53 (*)    All other components within normal limits  APTT - Abnormal; Notable for the following:    aPTT 38 (*)  All other components within normal limits  PROTIME-INR  CREATININE, SERUM     Radiology Dg Chest 2 View  Result Date: 11/23/2016 CLINICAL DATA:  Fever for several days. EXAM: CHEST  2 VIEW COMPARISON:  March 07, 2015 FINDINGS: The right Port-A-Cath is in good position. No pneumothorax. The cardiomediastinal silhouette is unchanged. No pulmonary nodules, masses, or focal infiltrates. IMPRESSION: No active cardiopulmonary disease. Electronically Signed   By: Dorise Bullion III M.D   On: 11/23/2016 15:39   Ct Angio Chest Pe W And/or Wo Contrast  Result Date: 11/23/2016 CLINICAL DATA:  Fever and chest pain. History of prior pulmonary embolus EXAM: CT ANGIOGRAPHY CHEST WITH CONTRAST TECHNIQUE: Multidetector CT imaging of the chest was performed using the standard protocol during bolus administration of intravenous contrast. Multiplanar CT image reconstructions and MIPs were obtained to evaluate the vascular anatomy. CONTRAST:  60 mL Isovue 370 nonionic COMPARISON:  CT angiogram chest Aug 23, 2015; chest radiograph November 23, 2016 FINDINGS: Cardiovascular: There is extensive pulmonary embolus. Pulmonary embolus arises in the midportion main right pulmonary artery with extension of pulmonary embolus into multiple right lower lobe pulmonary artery branches. On the left, there is pulmonary embolus arising from the distal main pulmonary artery extending into several lower lobe pulmonary arteries as well as into the proximal left upper lobe pulmonary artery. The main pulmonary outflow tract measures 3.3 cm, a finding likely indicative of pulmonary arterial hypertension. The right  ventricle to left ventricle diameter ratio is less than 0.9, a finding not indicative of right heart strain. Central catheter tip is at the cavoatrial junction. There is no appreciable thoracic aortic aneurysm or dissection. The visualized great vessels appear normal. Pericardium is not appreciably thickened. Mediastinum/Nodes: The visualized thyroid appears normal. There is no appreciable thoracic adenopathy. There is a focal hiatal hernia. Lungs/Pleura: There is no appreciable edema or consolidation. No pulmonary infarct is demonstrable on this study. There is mild atelectasis in the left upper lobe. There is no pleural effusion or pleural thickening evident. Upper Abdomen: Visualized upper abdominal structures appear unremarkable except for mild left adrenal hypertrophy. Musculoskeletal: There is degenerative change in the thoracic spine. There are no blastic or lytic bone lesions. Review of the MIP images confirms the above findings. IMPRESSION: 1. Extensive pulmonary embolism bilaterally without right heart strain demonstrated. 2. Prominence of the main pulmonary outflow tract, likely indicative of pulmonary arterial hypertension. 3.  No appreciable thoracic aortic aneurysm or dissection. 4. Slight atelectasis left upper lobe. No lung edema or consolidation. No pleural effusion. No pulmonary infarct evident. 5.  No demonstrable adenopathy. 6.  Mild left adrenal hypertrophy. 7.  Focal hiatal hernia. Critical Value/emergent results were called by telephone at the time of interpretation on 11/23/2016 at 6:22 pm to Dr. Dorie Rank , who verbally acknowledged these results. Electronically Signed   By: Lowella Grip III M.D.   On: 11/23/2016 18:23    Procedures .Critical Care Performed by: Dorie Rank Authorized by: Dorie Rank   Critical care provider statement:    Critical care time (minutes):  30   Critical care was time spent personally by me on the following activities:  Discussions with consultants,  evaluation of patient's response to treatment, examination of patient, ordering and performing treatments and interventions, ordering and review of laboratory studies, ordering and review of radiographic studies, pulse oximetry, re-evaluation of patient's condition, obtaining history from patient or surrogate and review of old charts   (including critical care time)  Medications Ordered in ED Medications  enoxaparin (LOVENOX) injection 170 mg (170 mg Subcutaneous Given 11/23/16 1820)  iopamidol (ISOVUE-370) 76 % injection (60 mLs Intravenous Contrast Given 11/23/16 1803)     Initial Impression / Assessment and Plan / ED Course  I have reviewed the triage vital signs and the nursing notes.  Pertinent labs & imaging results that were available during my care of the patient were reviewed by me and considered in my medical decision making (see chart for details).  Clinical Course as of Nov 24 1834  Sun Nov 23, 2016  1556 Verbal report: DVT study is positive.  Will start heparin.  With her complaints of chest pain earlier will get a PE study.  Pt states she was treated with lovenox for her DVT before.  Not oral meds.  Will check records  [JK]    Clinical Course User Index [JK] Dorie Rank, MD    Patient presents to emergency room with complaints of lower extremity pain and shortness of breath. She does have history of PE and DVT.  Laboratory tests are reassuring.  A Doppler study unfortunately shows an acute DVT. With her complaints of shortness of breath CT PE study was performed. Patient does have a recurrent pulmonary emboli. I initially started on Lovenox for the DVT.  We will continue that treatment I will consult for admission.  Final Clinical Impressions(s) / ED Diagnoses   Final diagnoses:  Other acute pulmonary embolism without acute cor pulmonale (HCC)  Acute deep vein thrombosis (DVT) of distal vein of left lower extremity (HCC)    New Prescriptions New Prescriptions   No  medications on file     Dorie Rank, MD 11/23/16 1836

## 2016-11-23 NOTE — ED Notes (Signed)
Please call inpatient nurse at [phone]. Call at [place].

## 2016-11-23 NOTE — ED Notes (Signed)
US Tech at bedside.

## 2016-11-23 NOTE — ED Notes (Signed)
Bed: WA25 Expected date:  Expected time:  Means of arrival:  Comments: tri 

## 2016-11-23 NOTE — ED Triage Notes (Signed)
Pt c/o fever x several days. Pt states she also has LLE pain and tenderness from calf to groin. Pt states she cannot walk without severe pain. Pt with hx of DVT in LLE. Pt d/c Lovenox in November.

## 2016-11-23 NOTE — Progress Notes (Signed)
[facility] for Enoxaparin Indication: DVT  Allergies  Allergen Reactions  . Penicillins Rash    Has patient had a PCN reaction causing immediate rash, facial/tongue/throat swelling, SOB or lightheadedness with hypotension: no Has patient had a PCN reaction causing severe rash involving mucus membranes or skin necrosis: no Has patient had a PCN reaction that required hospitalization in the hospital at the time Has patient had a PCN reaction occurring within the last 10 years: no If all of the above answers are "NO", then may proceed with Cephalosporin use.     Patient Measurements:    Vital Signs: Temp: 98.7 F (37.1 C) (08/19 1430) Temp Source: Oral (08/19 1430) BP: 142/70 (08/19 1430) Pulse Rate: 82 (08/19 1430)  Labs: No results for input(s): HGB, HCT, PLT, APTT, LABPROT, INR, HEPARINUNFRC, HEPRLOWMOCWT, CREATININE, CKTOTAL, CKMB, TROPONINI in the last 72 hours.  Estimated Creatinine Clearance: 76.3 mL/min (A) (by C-G formula based on SCr of 1.4 mg/dL (H)).   Medical History: Past Medical History:  Diagnosis Date  . Anemia   . Cough   . Diverticulitis with perforation 2010  . DVT, lower extremity ([place])   . Endometrial cancer ([place])   . Family history of adverse reaction to anesthesia    pts daughter had difficulty awakening following anesthesia   . GERD (gastroesophageal reflux disease)   . Headache   . History of bronchitis   . History of ear infections   . Hx of migraines   . Hypertension   . IBS (irritable bowel syndrome)   . Kidney infection   . Morbid obesity ([place])   . Ovarian cancer ([place]) dx'd 01/2015  . Pyelonephritis     Medications:   (Not in a hospital admission) Scheduled:  . enoxaparin (LOVENOX) injection  1 mg/kg Subcutaneous Q12H    Assessment: 51 y.o. female with PMH DVT admitted 11/23/2016 for leg pain and stiffness. Venous duplex shows acute LLE DVT; pharmacy to dose Lovenox   CBC: hgb low but > 10g,  Plt wnl  SCr: wnl, CrCl 91 ml/min  Previous anticoagulation: none currently, previous DVT treated with 6 months of Lovenox   Goal of Therapy: Anti-Xa level 0.6-1 units/ml 4hrs after LMWH dose given Prevention of VTE  Plan:  Lovenox 170 mg SQ q12 hr  Monitor for signs of bleeding or worsening thrombosis   Reuel Boom, PharmD, BCPS Pager: [phone] 11/23/2016, 4:38 PM

## 2016-11-23 NOTE — Progress Notes (Signed)
VASCULAR LAB PRELIMINARY  PRELIMINARY  PRELIMINARY  PRELIMINARY  Bilateral lower extremity venous duplex completed.    Preliminary report:  There is acute DVT noted in the femoral, popliteal, posterior tibial, and peroneal veins of the left lower extremity.   Gave report to Dr. Erenest Blank, [facility], RVT 11/23/2016, 3:58 PM

## 2016-11-24 ENCOUNTER — Ambulatory Visit: Payer: BLUE CROSS/BLUE SHIELD | Admitting: Gynecologic Oncology

## 2016-11-24 ENCOUNTER — Encounter (HOSPITAL_COMMUNITY): Payer: Self-pay | Admitting: Internal Medicine

## 2016-11-24 ENCOUNTER — Inpatient Hospital Stay (HOSPITAL_COMMUNITY): Payer: BLUE CROSS/BLUE SHIELD

## 2016-11-24 DIAGNOSIS — I5030 Unspecified diastolic (congestive) heart failure: Secondary | ICD-10-CM | POA: Diagnosis not present

## 2016-11-24 DIAGNOSIS — N183 Chronic kidney disease, stage 3 (moderate): Secondary | ICD-10-CM | POA: Diagnosis present

## 2016-11-24 DIAGNOSIS — K219 Gastro-esophageal reflux disease without esophagitis: Secondary | ICD-10-CM | POA: Diagnosis present

## 2016-11-24 DIAGNOSIS — I82432 Acute embolism and thrombosis of left popliteal vein: Secondary | ICD-10-CM | POA: Diagnosis present

## 2016-11-24 DIAGNOSIS — I129 Hypertensive chronic kidney disease with stage 1 through stage 4 chronic kidney disease, or unspecified chronic kidney disease: Secondary | ICD-10-CM | POA: Diagnosis present

## 2016-11-24 DIAGNOSIS — O2230 Deep phlebothrombosis in pregnancy, unspecified trimester: Secondary | ICD-10-CM

## 2016-11-24 DIAGNOSIS — D500 Iron deficiency anemia secondary to blood loss (chronic): Secondary | ICD-10-CM | POA: Diagnosis present

## 2016-11-24 DIAGNOSIS — K589 Irritable bowel syndrome without diarrhea: Secondary | ICD-10-CM | POA: Diagnosis present

## 2016-11-24 DIAGNOSIS — I82412 Acute embolism and thrombosis of left femoral vein: Secondary | ICD-10-CM | POA: Diagnosis present

## 2016-11-24 DIAGNOSIS — Z8542 Personal history of malignant neoplasm of other parts of uterus: Secondary | ICD-10-CM | POA: Diagnosis not present

## 2016-11-24 DIAGNOSIS — I82442 Acute embolism and thrombosis of left tibial vein: Secondary | ICD-10-CM | POA: Diagnosis present

## 2016-11-24 DIAGNOSIS — Z86718 Personal history of other venous thrombosis and embolism: Secondary | ICD-10-CM | POA: Diagnosis not present

## 2016-11-24 DIAGNOSIS — I82402 Acute embolism and thrombosis of unspecified deep veins of left lower extremity: Secondary | ICD-10-CM

## 2016-11-24 DIAGNOSIS — I82492 Acute embolism and thrombosis of other specified deep vein of left lower extremity: Secondary | ICD-10-CM | POA: Diagnosis not present

## 2016-11-24 DIAGNOSIS — D509 Iron deficiency anemia, unspecified: Secondary | ICD-10-CM | POA: Diagnosis present

## 2016-11-24 DIAGNOSIS — Z79899 Other long term (current) drug therapy: Secondary | ICD-10-CM | POA: Diagnosis not present

## 2016-11-24 DIAGNOSIS — E876 Hypokalemia: Secondary | ICD-10-CM | POA: Diagnosis present

## 2016-11-24 DIAGNOSIS — I2699 Other pulmonary embolism without acute cor pulmonale: Secondary | ICD-10-CM | POA: Diagnosis present

## 2016-11-24 DIAGNOSIS — Z880 Allergy status to penicillin: Secondary | ICD-10-CM | POA: Diagnosis not present

## 2016-11-24 DIAGNOSIS — Z8543 Personal history of malignant neoplasm of ovary: Secondary | ICD-10-CM | POA: Diagnosis not present

## 2016-11-24 HISTORY — DX: Deep phlebothrombosis in pregnancy, unspecified trimester: O22.30

## 2016-11-24 LAB — CBC
HCT: 32.8 % — ABNORMAL LOW (ref 36.0–46.0)
Hemoglobin: 10.9 g/dL — ABNORMAL LOW (ref 12.0–15.0)
MCH: 29.9 pg (ref 26.0–34.0)
MCHC: 33.2 g/dL (ref 30.0–36.0)
MCV: 90.1 fL (ref 78.0–100.0)
PLATELETS: 162 10*3/uL (ref 150–400)
RBC: 3.64 MIL/uL — ABNORMAL LOW (ref 3.87–5.11)
RDW: 13.6 % (ref 11.5–15.5)
WBC: 10.4 10*3/uL (ref 4.0–10.5)

## 2016-11-24 LAB — BASIC METABOLIC PANEL
ANION GAP: 7 (ref 5–15)
BUN: 22 mg/dL — ABNORMAL HIGH (ref 6–20)
CALCIUM: 9.1 mg/dL (ref 8.9–10.3)
CO2: 22 mmol/L (ref 22–32)
Chloride: 110 mmol/L (ref 101–111)
Creatinine, Ser: 1.26 mg/dL — ABNORMAL HIGH (ref 0.44–1.00)
GFR, EST AFRICAN AMERICAN: 56 mL/min — AB (ref >60)
GFR, EST NON AFRICAN AMERICAN: 48 mL/min — AB (ref >60)
Glucose, Bld: 102 mg/dL — ABNORMAL HIGH (ref 65–99)
Potassium: 4.4 mmol/L (ref 3.5–5.1)
SODIUM: 139 mmol/L (ref 135–145)

## 2016-11-24 LAB — TROPONIN I
Troponin I: <0.03 ng/mL (ref <0.03)
Troponin I: <0.03 ng/mL (ref <0.03)

## 2016-11-24 LAB — ECHOCARDIOGRAM COMPLETE
HEIGHTINCHES: 65 in
Weight: 5925.96 oz

## 2016-11-24 MED ORDER — ENOXAPARIN SODIUM 150 MG/ML ~~LOC~~ SOLN
1.0000 mg/kg | Freq: Two times a day (BID) | SUBCUTANEOUS | Status: DC
  Administered 2016-11-24 – 2016-11-25 (×2): 170 mg via SUBCUTANEOUS
  Filled 2016-11-24 (×3): qty 1.12

## 2016-11-24 MED ORDER — ENOXAPARIN SODIUM 150 MG/ML ~~LOC~~ SOLN
170.0000 mg | Freq: Two times a day (BID) | SUBCUTANEOUS | Status: DC
  Filled 2016-11-24: qty 1.13

## 2016-11-24 MED ORDER — ZOLPIDEM TARTRATE 5 MG PO TABS
5.0000 mg | ORAL_TABLET | Freq: Every evening | ORAL | Status: DC | PRN
  Administered 2016-11-24: 5 mg via ORAL
  Filled 2016-11-24: qty 1

## 2016-11-24 NOTE — Progress Notes (Signed)
  Echocardiogram 2D Echocardiogram has been performed.  [name] [name] 11/24/2016, 12:48 PM

## 2016-11-24 NOTE — Progress Notes (Signed)
Spoke with pt concerning Medication needs. Pt has Airline pilot. Encourage pt to ask for medication on the $4 list, check the company of the medication for coupons or assistant program, and down load Good RX.

## 2016-11-24 NOTE — Progress Notes (Signed)
Patient ID: [name], female   DOB: [birth year], 52 y.o.   MRN: [id number]  PROGRESS NOTE    [name]  IRJ:[id number] DOB: June 29, 1965 DOA: 11/23/2016  PCP: Alveda Reasons, MD   Brief Narrative:   51 year old female with medical history significant for ovarian cancer in remission, history of DVT and status post 6 months of Lovenox, chronic kidney disease stage III, acid reflux. Patient presented to ED with left lower extremity pain and swelling, associated shortness of breath and fatigue for past couple of weeks. The symptoms progressively worsened. No chest pain or cough. She was evaluated by primary care physician early in the course of this illness and has had hypokalemia, has had dietary changes and eventually her electrolytes normalized. In ED, patient was hemodynamically stable. Chest x-ray showed no acute cardiopulmonary disease. Blood work showed white blood cell count of 11.9, hemoglobin 11.9. Ultrasound of bilateral lower extremities showed acute DVT in left femoral, popliteal, posterior tibial and peroneal veins. CT angiogram of the chest showed extensive pulmonary emboli bilaterally without evidence for right heart strain. Patient was started on therapeutic Lovenox.  Assessment & Plan:   Principal Problem:   Pulmonary embolism ([place]) / acute DVT in the left femoral, popliteal, posterior tibial and peroneal veins - Continue anticoagulation with Lovenox - Patient was on anticoagulation for 6 months before because of history of DVT but has stopped Lovenox - She most likely will require long-term anticoagulation - Monitor for signs of bleeding  Active Problems:   Essential hypertension, benign - Blood pressure 107/58    Iron deficiency anemia - Hemoglobin 10.9    CKD (chronic kidney disease) stage 3, GFR 30-59 ml/min - Creatinine within baseline range, 1.2    DVT prophylaxis: Lovenox subQ Code Status: full code  Family Communication: no family at the  bedside Disposition Plan: home in am   Consultants:   None   Procedures:   LE doppler   Antimicrobials:   None    Subjective: Feels little better.  Objective: Vitals:   11/23/16 1900 11/23/16 2039 11/23/16 2325 11/24/16 0506  BP: 124/78 (!) 148/75  (!) 107/58  Pulse: 85 90  80  Resp: 17 18  16   Temp:  100 F (37.8 C) 98.8 F (37.1 C) 99.1 F (37.3 C)  TempSrc:  Axillary Oral Oral  SpO2: 97% 97%  95%  Weight:  (!) 168 kg (370 lb 6 oz)    Height:  5\' 5"  (1.651 m)     No intake or output data in the 24 hours ending 11/24/16 0840 Filed Weights   11/23/16 2039  Weight: (!) 168 kg (370 lb 6 oz)    Examination:  General exam: Appears calm and comfortable  Respiratory system: Clear to auscultation. Respiratory effort normal. Cardiovascular system: S1 & S2 heard, RRR. No JVD, murmurs, rubs, gallops or clicks. No pedal edema. Gastrointestinal system: Abdomen is nondistended, soft and nontender. No organomegaly or masses felt. Normal bowel sounds heard. Central nervous system: Alert and oriented. No focal neurological deficits. Extremities: (+) LE edema Skin: No rashes, lesions or ulcers Psychiatry: Judgement and insight appear normal. Mood & affect appropriate.   Data Reviewed: I have personally reviewed following labs and imaging studies  CBC:  Recent Labs Lab 11/23/16 1640 11/24/16 0323  WBC 11.9* 10.4  HGB 11.9* 10.9*  HCT 35.9* 32.8*  MCV 89.8 90.1  PLT 166 301   Basic Metabolic Panel:  Recent Labs Lab 11/23/16 1640 11/24/16 0323  NA 140 139  K 4.5 4.4  CL 111 110  CO2 20* 22  GLUCOSE 115* 102*  BUN 23* 22*  CREATININE 1.17* 1.26*  CALCIUM 9.6 9.1   GFR: Estimated Creatinine Clearance: 84.6 mL/min (A) (by C-G formula based on SCr of 1.26 mg/dL (H)). Liver Function Tests:  Recent Labs Lab 11/23/16 1640  AST 14*  ALT 12*  ALKPHOS 73  BILITOT 0.5  PROT 7.0  ALBUMIN 3.4*   No results for input(s): LIPASE, AMYLASE in the last 168  hours. No results for input(s): AMMONIA in the last 168 hours. Coagulation Profile:  Recent Labs Lab 11/23/16 1640  INR 1.18   Cardiac Enzymes:  Recent Labs Lab 11/23/16 2059 11/24/16 0323  TROPONINI <0.03 <0.03   BNP (last 3 results) No results for input(s): PROBNP in the last 8760 hours. HbA1C: No results for input(s): HGBA1C in the last 72 hours. CBG: No results for input(s): GLUCAP in the last 168 hours. Lipid Profile: No results for input(s): CHOL, HDL, LDLCALC, TRIG, CHOLHDL, LDLDIRECT in the last 72 hours. Thyroid Function Tests: No results for input(s): TSH, T4TOTAL, FREET4, T3FREE, THYROIDAB in the last 72 hours. Anemia Panel: No results for input(s): VITAMINB12, FOLATE, FERRITIN, TIBC, IRON, RETICCTPCT in the last 72 hours. Urine analysis:    Component Value Date/Time   COLORURINE AMBER (A) 03/14/2015 2118   APPEARANCEUR CLOUDY (A) 03/14/2015 2118   LABSPEC 1.015 04/23/2015 1247   PHURINE 5.0 04/23/2015 1247   PHURINE 5.5 03/14/2015 2118   GLUCOSEU Negative 04/23/2015 1247   HGBUR Negative 04/23/2015 1247   HGBUR MODERATE (A) 03/14/2015 2118   BILIRUBINUR Color Interference 04/23/2015 1247   KETONESUR Negative 04/23/2015 1247   KETONESUR NEGATIVE 03/14/2015 2118   PROTEINUR Color Interference 04/23/2015 1247   PROTEINUR NEGATIVE 03/14/2015 2118   UROBILINOGEN Color Interference 04/23/2015 1247   NITRITE Color Interference 04/23/2015 1247   NITRITE NEGATIVE 03/14/2015 2118   LEUKOCYTESUR Color Interference 04/23/2015 1247   Sepsis Labs: @LABRCNTIP (procalcitonin:4,lacticidven:4)   )No results found for this or any previous visit (from the past 240 hour(s)).    Radiology Studies: Dg Chest 2 View  Result Date: 11/23/2016 CLINICAL DATA:  Fever for several days. EXAM: CHEST  2 VIEW COMPARISON:  March 07, 2015 FINDINGS: The right Port-A-Cath is in good position. No pneumothorax. The cardiomediastinal silhouette is unchanged. No pulmonary nodules,  masses, or focal infiltrates. IMPRESSION: No active cardiopulmonary disease. Electronically Signed   By: Dorise Bullion III M.D   On: 11/23/2016 15:39   Ct Angio Chest Pe W And/or Wo Contrast  Result Date: 11/23/2016 CLINICAL DATA:  Fever and chest pain. History of prior pulmonary embolus EXAM: CT ANGIOGRAPHY CHEST WITH CONTRAST TECHNIQUE: Multidetector CT imaging of the chest was performed using the standard protocol during bolus administration of intravenous contrast. Multiplanar CT image reconstructions and MIPs were obtained to evaluate the vascular anatomy. CONTRAST:  60 mL Isovue 370 nonionic COMPARISON:  CT angiogram chest Aug 23, 2015; chest radiograph November 23, 2016 FINDINGS: Cardiovascular: There is extensive pulmonary embolus. Pulmonary embolus arises in the midportion main right pulmonary artery with extension of pulmonary embolus into multiple right lower lobe pulmonary artery branches. On the left, there is pulmonary embolus arising from the distal main pulmonary artery extending into several lower lobe pulmonary arteries as well as into the proximal left upper lobe pulmonary artery. The main pulmonary outflow tract measures 3.3 cm, a finding likely indicative of pulmonary arterial hypertension. The right ventricle to left ventricle diameter ratio is less than 0.9, a  finding not indicative of right heart strain. Central catheter tip is at the cavoatrial junction. There is no appreciable thoracic aortic aneurysm or dissection. The visualized great vessels appear normal. Pericardium is not appreciably thickened. Mediastinum/Nodes: The visualized thyroid appears normal. There is no appreciable thoracic adenopathy. There is a focal hiatal hernia. Lungs/Pleura: There is no appreciable edema or consolidation. No pulmonary infarct is demonstrable on this study. There is mild atelectasis in the left upper lobe. There is no pleural effusion or pleural thickening evident. Upper Abdomen: Visualized upper  abdominal structures appear unremarkable except for mild left adrenal hypertrophy. Musculoskeletal: There is degenerative change in the thoracic spine. There are no blastic or lytic bone lesions. Review of the MIP images confirms the above findings. IMPRESSION: 1. Extensive pulmonary embolism bilaterally without right heart strain demonstrated. 2. Prominence of the main pulmonary outflow tract, likely indicative of pulmonary arterial hypertension. 3.  No appreciable thoracic aortic aneurysm or dissection. 4. Slight atelectasis left upper lobe. No lung edema or consolidation. No pleural effusion. No pulmonary infarct evident. 5.  No demonstrable adenopathy. 6.  Mild left adrenal hypertrophy. 7.  Focal hiatal hernia. Critical Value/emergent results were called by telephone at the time of interpretation on 11/23/2016 at 6:22 pm to Dr. Dorie Rank , who verbally acknowledged these results. Electronically Signed   By: Lowella Grip III M.D.   On: 11/23/2016 18:23        Scheduled Meds: . enoxaparin (LOVENOX) injection  1 mg/kg Subcutaneous Q12H  . pantoprazole  40 mg Oral Daily  . sodium chloride flush  3 mL Intravenous Q12H  . sodium chloride flush  3 mL Intravenous Q12H  . triamcinolone cream   Topical BID   Continuous Infusions: . sodium chloride       LOS: 0 days    Time spent: 25 minutes  Greater than 50% of the time spent on counseling and coordinating the care.   Leisa Lenz, MD [facility] Pager [phone]  If 7PM-7AM, please contact night-coverage [email or web address] Password [email or web address] 11/24/2016, 8:40 AM

## 2016-11-24 NOTE — Plan of Care (Signed)
Problem: Education: Goal: Knowledge of [facility] Education information/materials will improve Outcome: Completed/Met Date Met: 11/24/16 Discussed plan of care with patient and daughter. Current poc verbalized

## 2016-11-25 DIAGNOSIS — I82492 Acute embolism and thrombosis of other specified deep vein of left lower extremity: Secondary | ICD-10-CM

## 2016-11-25 LAB — CBC
HCT: 32 % — ABNORMAL LOW (ref 36.0–46.0)
HEMOGLOBIN: 10.8 g/dL — AB (ref 12.0–15.0)
MCH: 30 pg (ref 26.0–34.0)
MCHC: 33.8 g/dL (ref 30.0–36.0)
MCV: 88.9 fL (ref 78.0–100.0)
PLATELETS: 172 10*3/uL (ref 150–400)
RBC: 3.6 MIL/uL — AB (ref 3.87–5.11)
RDW: 13.3 % (ref 11.5–15.5)
WBC: 9.1 10*3/uL (ref 4.0–10.5)

## 2016-11-25 LAB — BASIC METABOLIC PANEL
Anion gap: 6 (ref 5–15)
BUN: 26 mg/dL — AB (ref 6–20)
CHLORIDE: 108 mmol/L (ref 101–111)
CO2: 23 mmol/L (ref 22–32)
Calcium: 8.8 mg/dL — ABNORMAL LOW (ref 8.9–10.3)
Creatinine, Ser: 1.29 mg/dL — ABNORMAL HIGH (ref 0.44–1.00)
GFR calc Af Amer: 55 mL/min — ABNORMAL LOW (ref >60)
GFR calc non Af Amer: 47 mL/min — ABNORMAL LOW (ref >60)
GLUCOSE: 106 mg/dL — AB (ref 65–99)
POTASSIUM: 4.4 mmol/L (ref 3.5–5.1)
Sodium: 137 mmol/L (ref 135–145)

## 2016-11-25 LAB — HIV ANTIBODY (ROUTINE TESTING W REFLEX): HIV SCREEN 4TH GENERATION: NONREACTIVE

## 2016-11-25 MED ORDER — HEPARIN SOD (PORK) LOCK FLUSH 100 UNIT/ML IV SOLN
500.0000 [IU] | INTRAVENOUS | Status: AC | PRN
  Administered 2016-11-25: 500 [IU]

## 2016-11-25 MED ORDER — ENOXAPARIN SODIUM 150 MG/ML ~~LOC~~ SOLN: 1.0000 mg/kg | Freq: Two times a day (BID) | SUBCUTANEOUS | 0 refills | Status: DC

## 2016-11-25 MED FILL — ENOXAPARIN 80 MG/0.8 ML SYR: 80 | 30 days supply | Qty: 96 | Fill #0

## 2016-11-25 MED FILL — traMADol HCL 50 MG TABS: 50 | 3 days supply | Qty: 10 | Fill #0

## 2016-11-25 NOTE — Discharge Instructions (Signed)
Enoxaparin injection What is this medicine? ENOXAPARIN (ee nox a PA rin) is used after knee, hip, or abdominal surgeries to prevent blood clotting. It is also used to treat existing blood clots in the lungs or in the veins. This medicine may be used for other purposes; ask your health care provider or pharmacist if you have questions. COMMON BRAND NAME(S): Lovenox What should I tell my health care provider before I take this medicine? They need to know if you have any of these conditions: -bleeding disorders, hemorrhage, or hemophilia -infection of the heart or heart valves -kidney or liver disease -previous stroke -prosthetic heart valve -recent surgery or delivery of a baby -ulcer in the stomach or intestine, diverticulitis, or other bowel disease -an unusual or allergic reaction to enoxaparin, heparin, pork or pork products, other medicines, foods, dyes, or preservatives -pregnant or trying to get pregnant -breast-feeding How should I use this medicine? This medicine is for injection under the skin. It is usually given by a health-care professional. You or a family member may be trained on how to give the injections. If you are to give yourself injections, make sure you understand how to use the syringe, measure the dose if necessary, and give the injection. To avoid bruising, do not rub the site where this medicine has been injected. Do not take your medicine more often than directed. Do not stop taking except on the advice of your doctor or health care professional. Make sure you receive a puncture-resistant container to dispose of the needles and syringes once you have finished with them. Do not reuse these items. Return the container to your doctor or health care professional for proper disposal. Talk to your pediatrician regarding the use of this medicine in children. Special care may be needed. Overdosage: If you think you have taken too much of this medicine contact a poison control  center or emergency room at once. NOTE: This medicine is only for you. Do not share this medicine with others. What if I miss a dose? If you miss a dose, take it as soon as you can. If it is almost time for your next dose, take only that dose. Do not take double or extra doses. What may interact with this medicine? -aspirin and aspirin-like medicines -certain medicines that treat or prevent blood clots -dipyridamole -NSAIDs, medicines for pain and inflammation, like ibuprofen or naproxen This list may not describe all possible interactions. Give your health care provider a list of all the medicines, herbs, non-prescription drugs, or dietary supplements you use. Also tell them if you smoke, drink alcohol, or use illegal drugs. Some items may interact with your medicine. What should I watch for while using this medicine? Visit your doctor or health care professional for regular checks on your progress. Your condition will be monitored carefully while you are receiving this medicine. Notify your doctor or health care professional and seek emergency treatment if you develop breathing problems; changes in vision; chest pain; severe, sudden headache; pain, swelling, warmth in the leg; trouble speaking; sudden numbness or weakness of the face, arm, or leg. These can be signs that your condition has gotten worse. If you are going to have surgery, tell your doctor or health care professional that you are taking this medicine. Do not stop taking this medicine without first talking to your doctor. Be sure to refill your prescription before you run out of medicine. Avoid sports and activities that might cause injury while you are using this medicine. Severe   falls or injuries can cause unseen bleeding. Be careful when using sharp tools or knives. Consider using an electric razor. Take special care brushing or flossing your teeth. Report any injuries, bruising, or red spots on the skin to your doctor or health care  professional. What side effects may I notice from receiving this medicine? Side effects that you should report to your doctor or health care professional as soon as possible: -allergic reactions like skin rash, itching or hives, swelling of the face, lips, or tongue -feeling faint or lightheaded, falls -signs and symptoms of bleeding such as bloody or black, tarry stools; red or dark-brown urine; spitting up blood or brown material that looks like coffee grounds; red spots on the skin; unusual bruising or bleeding from the eye, gums, or nose Side effects that usually do not require medical attention (report to your doctor or health care professional if they continue or are bothersome): -pain, redness, or irritation at site where injected This list may not describe all possible side effects. Call your doctor for medical advice about side effects. You may report side effects to FDA at 1-800-FDA-1088. Where should I keep my medicine? Keep out of the reach of children. Store at room temperature between 15 and 30 degrees C (59 and 86 degrees F). Do not freeze. If your injections have been specially prepared, you may need to store them in the refrigerator. Ask your pharmacist. Throw away any unused medicine after the expiration date. NOTE: This sheet is a summary. It may not cover all possible information. If you have questions about this medicine, talk to your doctor, pharmacist, or health care provider.  2018 [employer] Standard (2013-07-26 16:06:21)  

## 2016-11-25 NOTE — Discharge Summary (Signed)
Physician Discharge Summary  [name] XHB:[id number] DOB: 07/12/1965 DOA: 11/23/2016  PCP: Alveda Reasons, MD  Admit date: 11/23/2016 Discharge date: 11/25/2016  Recommendations for Outpatient Follow-up:  Continue Lovenox for DVT and pulmonary embolism. If there is any sign of bleed please stop using Lovenox.  Discharge Diagnoses:  Principal Problem:   Pulmonary embolism (HCC) Active Problems:   Essential hypertension, benign   Iron deficiency anemia due to chronic blood loss   CKD (chronic kidney disease) stage 3, GFR 30-59 ml/min   Acute deep vein thrombosis (DVT) of left lower extremity (HCC)   Acute pulmonary embolism (HCC)   DVT (deep vein thrombosis) in pregnancy [facility])    Discharge Condition: stable   Diet recommendation: as tolerated   History of present illness:  51 year old female with medical history significant for ovarian cancer in remission, history of DVT and status post 6 months of Lovenox, chronic kidney disease stage III, acid reflux. Patient presented to ED with left lower extremity pain and swelling, associated shortness of breath and fatigue for past couple of weeks. The symptoms progressively worsened. No chest pain or cough. She was evaluated by primary care physician early in the course of this illness and has had hypokalemia, has had dietary changes and eventually her electrolytes normalized. In ED, patient was hemodynamically stable. Chest x-ray showed no acute cardiopulmonary disease. Blood work showed white blood cell count of 11.9, hemoglobin 11.9. Ultrasound of bilateral lower extremities showed acute DVT in left femoral, popliteal, posterior tibial and peroneal veins. CT angiogram of the chest showed extensive pulmonary emboli bilaterally without evidence for right heart strain. Patient was started on therapeutic Lovenox.  Hospital Course:  Principal Problem:   Pulmonary embolism ([place]) / acute DVT in the left femoral, popliteal, posterior  tibial and peroneal veins - Patient was on anticoagulation for 6 months before because of history of DVT but has stopped Lovenox - CT angiogram of the chest showed extensive pulmonary emboli bilaterally without evidence for right heart strain - Ultrasound of bilateral lower extremities showed acute DVT in left femoral, popliteal, posterior tibial and peroneal veins - Pt will continue subQ Lovenox on discharge. Last time she was on Lovenox was in 02/2016    Active Problems:   Essential hypertension, benign - Blood pressure 107/58    Iron deficiency anemia - Hemoglobin 10.9    CKD (chronic kidney disease) stage 3, GFR 30-59 ml/min - Creatinine within baseline range, 1.2    DVT prophylaxis: Lovenox subQ Code Status: full code  Family Communication: no family at the bedside   Consultants:   None   Procedures:   LE doppler    Antimicrobials:   None      Signed:  Leisa Lenz, MD  [facility] 11/25/2016, 11:22 AM  Pager #: [phone]  Time spent in minutes: more than 30 minutes    Discharge Exam: Vitals:   11/25/16 0415 11/25/16 0505  BP:  (!) 110/59  Pulse:  84  Resp:  18  Temp: 99.6 F (37.6 C) 99.2 F (37.3 C)  SpO2: 98% 100%   Vitals:   11/24/16 1357 11/24/16 2230 11/25/16 0415 11/25/16 0505  BP: 126/61 122/63  (!) 110/59  Pulse: 78 82  84  Resp: 18 18  18   Temp: 98 F (36.7 C) 99.6 F (37.6 C) 99.6 F (37.6 C) 99.2 F (37.3 C)  TempSrc: Oral Oral Oral Oral  SpO2: 98% 100% 98% 100%  Weight:      Height:  General: Pt is alert, follows commands appropriately, not in acute distress Cardiovascular: Regular rate and rhythm, S1/S2 + Respiratory: Clear to auscultation bilaterally, no wheezing, no crackles, no rhonchi Abdominal: Soft, non tender, non distended, bowel sounds +, no guarding Extremities: no cyanosis, pulses palpable bilaterally DP and PT Neuro: Grossly nonfocal  Discharge Instructions  Discharge  Instructions    Call MD for:  persistant nausea and vomiting    Complete by:  As directed    Call MD for:  severe uncontrolled pain    Complete by:  As directed    Diet - low sodium heart healthy    Complete by:  As directed    Discharge instructions    Complete by:  As directed    Continue Lovenox for DVT and pulmonary embolism. If there is any sign of bleed please stop using Lovenox.   Increase activity slowly    Complete by:  As directed      Allergies as of 11/25/2016      Reactions   Penicillins Rash   Has patient had a PCN reaction causing immediate rash, facial/tongue/throat swelling, SOB or lightheadedness with hypotension: no Has patient had a PCN reaction causing severe rash involving mucus membranes or skin necrosis: no Has patient had a PCN reaction that required hospitalization in the hospital at the time Has patient had a PCN reaction occurring within the last 10 years: no If all of the above answers are "NO", then may proceed with Cephalosporin use.      Medication List    TAKE these medications   acetaminophen 500 MG tablet Commonly known as:  TYLENOL Take 1,000 mg by mouth every 6 (six) hours as needed for mild pain, moderate pain, fever or headache. Reported on 09/10/2015   cetirizine 10 MG tablet Commonly known as:  ZYRTEC Take 1 tablet (10 mg total) by mouth daily as needed for allergies (itching).   [employer] ELECTROLYTE SOLUTION PO Take 20-40 drops by mouth daily. Place 20-40 drops in liquid daily   enoxaparin 150 MG/ML injection Commonly known as:  LOVENOX Inject 1.12 mLs (170 mg total) into the skin every 12 (twelve) hours.   gabapentin 100 MG capsule Commonly known as:  NEURONTIN Take 100 mg by mouth at bedtime as needed.   GERITOL COMPLETE Tabs Take 1 tablet by mouth daily.   lidocaine-prilocaine cream Commonly known as:  EMLA Apply to PAC site 1-2 hours prior to access as needed.   pantoprazole 40 MG tablet Commonly known as:  PROTONIX Take 1  tablet (40 mg total) by mouth daily.   triamcinolone cream 0.1 % Commonly known as:  KENALOG APPLY TO THE AFFECTED AREA(S) TWICE DAILY AS NEEDED      Follow-up Information    Alveda Reasons, MD. Schedule an appointment as soon as possible for a visit in 1 week(s).   Specialty:  Family Medicine Contact information: [place] [address] [phone]            The results of significant diagnostics from this hospitalization (including imaging, microbiology, ancillary and laboratory) are listed below for reference.    Significant Diagnostic Studies: Dg Chest 2 View  Result Date: 11/23/2016 CLINICAL DATA:  Fever for several days. EXAM: CHEST  2 VIEW COMPARISON:  March 07, 2015 FINDINGS: The right Port-A-Cath is in good position. No pneumothorax. The cardiomediastinal silhouette is unchanged. No pulmonary nodules, masses, or focal infiltrates. IMPRESSION: No active cardiopulmonary disease. Electronically Signed   By: Dorise Bullion  III M.D   On: 11/23/2016 15:39   Ct Angio Chest Pe W And/or Wo Contrast  Result Date: 11/23/2016 CLINICAL DATA:  Fever and chest pain. History of prior pulmonary embolus EXAM: CT ANGIOGRAPHY CHEST WITH CONTRAST TECHNIQUE: Multidetector CT imaging of the chest was performed using the standard protocol during bolus administration of intravenous contrast. Multiplanar CT image reconstructions and MIPs were obtained to evaluate the vascular anatomy. CONTRAST:  60 mL Isovue 370 nonionic COMPARISON:  CT angiogram chest Aug 23, 2015; chest radiograph November 23, 2016 FINDINGS: Cardiovascular: There is extensive pulmonary embolus. Pulmonary embolus arises in the midportion main right pulmonary artery with extension of pulmonary embolus into multiple right lower lobe pulmonary artery branches. On the left, there is pulmonary embolus arising from the distal main pulmonary artery extending into several lower lobe pulmonary arteries as well as  into the proximal left upper lobe pulmonary artery. The main pulmonary outflow tract measures 3.3 cm, a finding likely indicative of pulmonary arterial hypertension. The right ventricle to left ventricle diameter ratio is less than 0.9, a finding not indicative of right heart strain. Central catheter tip is at the cavoatrial junction. There is no appreciable thoracic aortic aneurysm or dissection. The visualized great vessels appear normal. Pericardium is not appreciably thickened. Mediastinum/Nodes: The visualized thyroid appears normal. There is no appreciable thoracic adenopathy. There is a focal hiatal hernia. Lungs/Pleura: There is no appreciable edema or consolidation. No pulmonary infarct is demonstrable on this study. There is mild atelectasis in the left upper lobe. There is no pleural effusion or pleural thickening evident. Upper Abdomen: Visualized upper abdominal structures appear unremarkable except for mild left adrenal hypertrophy. Musculoskeletal: There is degenerative change in the thoracic spine. There are no blastic or lytic bone lesions. Review of the MIP images confirms the above findings. IMPRESSION: 1. Extensive pulmonary embolism bilaterally without right heart strain demonstrated. 2. Prominence of the main pulmonary outflow tract, likely indicative of pulmonary arterial hypertension. 3.  No appreciable thoracic aortic aneurysm or dissection. 4. Slight atelectasis left upper lobe. No lung edema or consolidation. No pleural effusion. No pulmonary infarct evident. 5.  No demonstrable adenopathy. 6.  Mild left adrenal hypertrophy. 7.  Focal hiatal hernia. Critical Value/emergent results were called by telephone at the time of interpretation on 11/23/2016 at 6:22 pm to Dr. Dorie Rank , who verbally acknowledged these results. Electronically Signed   By: Lowella Grip III M.D.   On: 11/23/2016 18:23    Microbiology: No results found for this or any previous visit (from the past 240 hour(s)).    Labs: Basic Metabolic Panel:  Recent Labs Lab 11/23/16 1640 11/24/16 0323 11/25/16 0343  NA 140 139 137  K 4.5 4.4 4.4  CL 111 110 108  CO2 20* 22 23  GLUCOSE 115* 102* 106*  BUN 23* 22* 26*  CREATININE 1.17* 1.26* 1.29*  CALCIUM 9.6 9.1 8.8*   Liver Function Tests:  Recent Labs Lab 11/23/16 1640  AST 14*  ALT 12*  ALKPHOS 73  BILITOT 0.5  PROT 7.0  ALBUMIN 3.4*   No results for input(s): LIPASE, AMYLASE in the last 168 hours. No results for input(s): AMMONIA in the last 168 hours. CBC:  Recent Labs Lab 11/23/16 1640 11/24/16 0323 11/25/16 0343  WBC 11.9* 10.4 9.1  HGB 11.9* 10.9* 10.8*  HCT 35.9* 32.8* 32.0*  MCV 89.8 90.1 88.9  PLT 166 162 172   Cardiac Enzymes:  Recent Labs Lab 11/23/16 2059 11/24/16 0323 11/24/16 0907  TROPONINI <0.03 <  0.03 <0.03   BNP: BNP (last 3 results) No results for input(s): BNP in the last 8760 hours.  ProBNP (last 3 results) No results for input(s): PROBNP in the last 8760 hours.  CBG: No results for input(s): GLUCAP in the last 168 hours.

## 2016-11-25 NOTE — Progress Notes (Signed)
Patient remains a&ox4 ambulatory without assist. Discharge instructions reviewed. Pt inquired about pain medication prescription. Provider contacted. Provider to call prescription into [facility].

## 2016-11-25 NOTE — Progress Notes (Signed)
[place] for Enoxaparin Indication: DVT  Allergies  Allergen Reactions  . Penicillins Rash    Has patient had a PCN reaction causing immediate rash, facial/tongue/throat swelling, SOB or lightheadedness with hypotension: no Has patient had a PCN reaction causing severe rash involving mucus membranes or skin necrosis: no Has patient had a PCN reaction that required hospitalization in the hospital at the time Has patient had a PCN reaction occurring within the last 10 years: no If all of the above answers are "NO", then may proceed with Cephalosporin use.     Patient Measurements: Height: 5\' 5"  (165.1 cm) Weight: (!) 370 lb 6 oz (168 kg) IBW/kg (Calculated) : 57  Vital Signs: Temp: 99.2 F (37.3 C) (08/21 0505) Temp Source: Oral (08/21 0505) BP: 110/59 (08/21 0505) Pulse Rate: 84 (08/21 0505)  Labs:  Recent Labs  11/23/16 1640 11/23/16 2059 11/24/16 0323 11/24/16 0907 11/25/16 0343  HGB 11.9*  --  10.9*  --  10.8*  HCT 35.9*  --  32.8*  --  32.0*  PLT 166  --  162  --  172  APTT 38*  --   --   --   --   LABPROT 15.0  --   --   --   --   INR 1.18  --   --   --   --   CREATININE 1.17*  --  1.26*  --  1.29*  TROPONINI  --  <0.03 <0.03 <0.03  --     Estimated Creatinine Clearance: 82.6 mL/min (A) (by C-G formula based on SCr of 1.29 mg/dL (H)).   Medical History: Past Medical History:  Diagnosis Date  . Anemia   . Cough   . Diverticulitis with perforation 2010  . DVT (deep vein thrombosis) in pregnancy ([place]) 11/24/2016  . DVT, lower extremity ([place])   . Endometrial cancer ([place])   . Family history of adverse reaction to anesthesia    pts daughter had difficulty awakening following anesthesia   . GERD (gastroesophageal reflux disease)   . Headache   . History of bronchitis   . History of ear infections   . Hx of migraines   . Hypertension   . IBS (irritable bowel syndrome)   . Kidney infection   . Morbid obesity ([place])    . Ovarian cancer ([place]) dx'd 01/2015  . Pyelonephritis     Assessment:  63 yoF admitted with acute DVT and PE.  Venous duplex shows acute LLE DVT; CT chest showed extensive pulmonary embolism bilaterally without right heart strain.  PMH significant for previous DVT in May 2017, treated with 6 months of Lovenox and h/o ovarian carcinosarcoma.  Pharmacy consulted to start treatment Lovenox.  Started on Lovenox 1 mg/kg (170 mg) SQ q12h on 8/19.  Labs 8/21  CBC: hgb low but stable, Plt wnl  SCr: slightly elevated, CrCl~82 ml/min  Goal of Therapy: Anti-Xa level 0.6-1 units/ml 4hrs after LMWH dose given Prevention of VTE  Plan:  Continue Lovenox 170 mg SQ q12h  Patient on high dose given significant obesity.  Will check an anti-Xa level tomorrow morning to ensure within therapeutic range.    Hershal Coria, PharmD, BCPS Pager: [phone] 11/25/2016 11:32 AM

## 2016-11-26 ENCOUNTER — Telehealth: Payer: Self-pay | Admitting: *Deleted

## 2016-11-26 DIAGNOSIS — I824Y9 Acute embolism and thrombosis of unspecified deep veins of unspecified proximal lower extremity: Secondary | ICD-10-CM

## 2016-11-26 MED ORDER — ENOXAPARIN SODIUM 150 MG/ML ~~LOC~~ SOLN: 150.0000 mg | Freq: Two times a day (BID) | SUBCUTANEOUS | 1 refills | Status: DC

## 2016-11-26 NOTE — Addendum Note (Signed)
Addended byMingo Amber, [place] H on: 11/26/2016 12:08 PM   Modules accepted: Orders

## 2016-11-26 NOTE — Telephone Encounter (Signed)
Attempted to call patietn back.  However, phone rang multiple times and no voicemail set up so could not leave message. Will attempt calling back later today.

## 2016-11-26 NOTE — Telephone Encounter (Signed)
Patient has now had second DVT.  First provoked by cancer.  She is currently on lovenox due to history of malignancy. Previously was on this for 6 months and then stopped.  Question will be how long does she need to be on lovenox before switching to oral anticoagulation in light of cancer history and now lifelong need for anticoagulation.  Will refer to Heme-Onc for further recommendations for this.    I have sent in a refill of her lovenox to cover until she is seen by hematology.

## 2016-11-26 NOTE — Telephone Encounter (Signed)
Patient called requesting to speak with PCP regarding her Lovenox injections.  Derl Barrow, RN

## 2016-11-28 ENCOUNTER — Telehealth: Payer: Self-pay | Admitting: Licensed Clinical Social Worker

## 2016-11-28 NOTE — Progress Notes (Signed)
LCSW received voice message from patient.  Reports she has been in the hospital and unable to come in for [facility] appointment.  requesting LCSW call her.   Returned call to patient, Left message to call LCSW.  Plan:  LCSW will wait for return call.  [name], LCSW Licensed Clinical Social Worker [place]   [phone] 11:02 AM

## 2016-12-02 ENCOUNTER — Telehealth: Payer: Self-pay | Admitting: *Deleted

## 2016-12-02 NOTE — Telephone Encounter (Signed)
Patient called and rescheduled her missed appt. Appt rescheduled to September 7th.

## 2016-12-02 NOTE — Progress Notes (Signed)
F/U call to patient, She is requesting an integrated behavioral health appointment.  Appointment scheduled Thursday Aug 30th at 10:30 prior to hospital F/U appointment   [name], LCSW Licensed Clinical Social Worker [place]   [phone] 11:54 AM

## 2016-12-04 ENCOUNTER — Encounter: Payer: Self-pay | Admitting: Family Medicine

## 2016-12-04 ENCOUNTER — Ambulatory Visit (INDEPENDENT_AMBULATORY_CARE_PROVIDER_SITE_OTHER): Payer: BLUE CROSS/BLUE SHIELD | Admitting: Family Medicine

## 2016-12-04 ENCOUNTER — Ambulatory Visit (INDEPENDENT_AMBULATORY_CARE_PROVIDER_SITE_OTHER): Payer: BLUE CROSS/BLUE SHIELD | Admitting: Licensed Clinical Social Worker

## 2016-12-04 DIAGNOSIS — Z658 Other specified problems related to psychosocial circumstances: Secondary | ICD-10-CM

## 2016-12-04 DIAGNOSIS — Z6841 Body Mass Index (BMI) 40.0 and over, adult: Secondary | ICD-10-CM

## 2016-12-04 DIAGNOSIS — Z23 Encounter for immunization: Secondary | ICD-10-CM

## 2016-12-04 DIAGNOSIS — Z7901 Long term (current) use of anticoagulants: Secondary | ICD-10-CM | POA: Diagnosis not present

## 2016-12-04 DIAGNOSIS — I2699 Other pulmonary embolism without acute cor pulmonale: Secondary | ICD-10-CM | POA: Diagnosis not present

## 2016-12-04 MED ORDER — RIVAROXABAN (XARELTO) VTE STARTER PACK (15 & 20 MG): ORAL_TABLET | ORAL | 0 refills | Status: DC

## 2016-12-04 MED FILL — XARELTO STARTER PACK: 15 & 20 | 30 days supply | Qty: 51 | Fill #0

## 2016-12-04 NOTE — Assessment & Plan Note (Signed)
Two clotting episodes.  First was while chemo for ovarian cancer Second episode was while in remission. Needs lifelong anticoag.

## 2016-12-04 NOTE — Progress Notes (Signed)
Total time: 30 minutes Type of Service: Integrated Behavioral Health Visit  Interpretor:No.    SUBJECTIVE: [name] is a 51 y.o. female, self referral for: managing stress.   Patient reports the following concerns: wants to avoid becoming depressed due to current stressors related to finances and chronic health issues.   LIFE CONTEXT:  Family & Social: lives with daughter, son-in-law and grandchildren.   School/ Work: unable to work, has applied for disability, receives [employer]  Life changes: recently admitted to hospital, managing chronic medical concerns.  GOALS:  Patient will reduce symptoms of: stress, and increase knowledge and ability OM:[facility] skills, healthy habits, self-management skills and stress reduction,   Intervention: Solution-Focused Strategies, Relaxation Training, Reflective listening, [employer],    Issues discussed: current stressors, cancer support groups, Transportation options, staying connected to family, reduce isolation.  ASSESSMENT:Patient continues to experience stress associated with her chronic medical conditions and finances.  Patient may benefit from, and is in agreement to continue receiving further assessment and brief therapeutic interventions to assist with managing stress.   PLAN: Patient will; 1. schedule F/U appointment as needed 2. complete [place] application for transportation 3. utilize problem solving (talking Points) when needed. 4. register for GYN Cancer support Group 5. Review "[place]"   [name], LCSW Licensed Clinical Social Worker [facility]   [phone] 11:31 AM

## 2016-12-04 NOTE — Patient Instructions (Signed)
Please continue the lovenox injections until you have had three doses of the new medication. I will give you a prescription for the new medication.  Please let us know promptly if you can't afford to fill it. See Dr. Mingo Amber in 3 weeks.  He can get you set with the long term presciption.   Great job with the diet and wt loss.  Keep it up.

## 2016-12-05 NOTE — Assessment & Plan Note (Signed)
Congratulated on wt loss and urged to continue to focus on diet and exercise.

## 2016-12-05 NOTE — Progress Notes (Signed)
   Subjective:    Patient ID: [name], female    DOB: [birth year], 51 y.o.   MRN: [id number]  HPI  Hospital follow up for PE. Admitted to hospitalist service.  I was initially puzzled about why sent home only on lovenox.  I believe I was able to make sense of the story. This is patient's second clotting event.   First was ~1 year ago as she was completing treatment for both ovarian and endometrial cancer.  Because the event was cancer related, she was treated with 6 months of lovenox.  She has been off all cancer treatments for one year.  Her cancer is felt to be in remission/cured.  She was hospitalized on 8/19 and found to have a documented PE.   She feels fine now.  Denies CP, SOB or DOE.  No bleeding  She hopes she does not need long term injections.  She was told she likely needs to see a hematologist.  As an aside, she has began a new diet prior to her PE (ketogenic, Julien Girt like diet) and wonders if that had anything to do with the PE.  She also hopes to continue this diet because she is quite pleased with her to-date 12 lb wt loss   Review of Systems     Objective:   Physical Exam VS noted No O2 No resp distress Lungs clear Cardiac RRR without m or g       Assessment & Plan:

## 2016-12-05 NOTE — Assessment & Plan Note (Signed)
Feel this is not cancer related.  As such, a DOAC is preferred therapy. Discussed need for lifelong anti coag. Short overlap with lovenox. She believes her insurance will cover the costly DOAC

## 2016-12-12 ENCOUNTER — Encounter: Payer: Self-pay | Admitting: Gynecologic Oncology

## 2016-12-12 ENCOUNTER — Other Ambulatory Visit: Payer: BLUE CROSS/BLUE SHIELD

## 2016-12-12 ENCOUNTER — Ambulatory Visit: Payer: BLUE CROSS/BLUE SHIELD | Attending: Gynecologic Oncology | Admitting: Gynecologic Oncology

## 2016-12-12 VITALS — BP 132/79 | HR 75 | Temp 98.1°F | Resp 18 | Wt 378.7 lb

## 2016-12-12 DIAGNOSIS — K589 Irritable bowel syndrome without diarrhea: Secondary | ICD-10-CM | POA: Diagnosis not present

## 2016-12-12 DIAGNOSIS — C569 Malignant neoplasm of unspecified ovary: Secondary | ICD-10-CM

## 2016-12-12 DIAGNOSIS — Z6841 Body Mass Index (BMI) 40.0 and over, adult: Secondary | ICD-10-CM | POA: Diagnosis not present

## 2016-12-12 DIAGNOSIS — Z08 Encounter for follow-up examination after completed treatment for malignant neoplasm: Secondary | ICD-10-CM | POA: Diagnosis not present

## 2016-12-12 DIAGNOSIS — Z880 Allergy status to penicillin: Secondary | ICD-10-CM | POA: Diagnosis not present

## 2016-12-12 DIAGNOSIS — D649 Anemia, unspecified: Secondary | ICD-10-CM | POA: Diagnosis not present

## 2016-12-12 DIAGNOSIS — Z8543 Personal history of malignant neoplasm of ovary: Secondary | ICD-10-CM | POA: Diagnosis not present

## 2016-12-12 DIAGNOSIS — Z7901 Long term (current) use of anticoagulants: Secondary | ICD-10-CM | POA: Diagnosis not present

## 2016-12-12 DIAGNOSIS — Z90710 Acquired absence of both cervix and uterus: Secondary | ICD-10-CM

## 2016-12-12 DIAGNOSIS — Z86711 Personal history of pulmonary embolism: Secondary | ICD-10-CM

## 2016-12-12 DIAGNOSIS — Z8542 Personal history of malignant neoplasm of other parts of uterus: Secondary | ICD-10-CM | POA: Diagnosis not present

## 2016-12-12 DIAGNOSIS — K219 Gastro-esophageal reflux disease without esophagitis: Secondary | ICD-10-CM | POA: Insufficient documentation

## 2016-12-12 DIAGNOSIS — Z9221 Personal history of antineoplastic chemotherapy: Secondary | ICD-10-CM

## 2016-12-12 DIAGNOSIS — Z79899 Other long term (current) drug therapy: Secondary | ICD-10-CM | POA: Diagnosis not present

## 2016-12-12 DIAGNOSIS — I10 Essential (primary) hypertension: Secondary | ICD-10-CM | POA: Insufficient documentation

## 2016-12-12 DIAGNOSIS — Z86718 Personal history of other venous thrombosis and embolism: Secondary | ICD-10-CM | POA: Diagnosis not present

## 2016-12-12 NOTE — Progress Notes (Signed)
Followup of Consult: Gyn-Onc  CC:  Chief Complaint  Patient presents with  . Ovarian Cancer    follow-up    Assessment/Plan:  [name]  is a 51 y.o.  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  She is s/p exploratory laparotomy and TAH, BSO, optimal cytoreduction to no residual visible disease on 03/13/15. S/p 6 cycles of adjuvant carboplatin and paclitaxel chemotherapy completed 08/02/15. Genetic testing negative.  Hx of PE in May, 2017, with recurrent DVT and PE in October, 2018 - on Lovenox.  Complete response to treatment.  May 2018 CT - no evidence of recurrence/progression. CA 125 normal in May, 2018 - recheck today  HPI: [name] is a very pleasant 51 year old woman with history of 2 prior cesarean sections who is seen initially in the hospital as an inpatient consultation at [facility] on 02/19/2015 for complex pelvic masses. The patient is a morbidly obese woman with a BMI 62 kg/m. She was admitted to [facility] on 02/18/2015 with pyelonephritis. CT imaging and then MRI imaging was performed to evaluate for an etiology for pyelonephritis. This identified A large pelvic mass which it to be arising from the right adnexa. It measured 13 x 9 x 17.5 cm and was irregular cystic and solid in nature. A second, but cystic and solid lesion within the left adnexa measuring 4.4 x 6.5 x 5.3 cm. In the third cystic and solid mass was seen anterior to the uterus and superior to the bladder measuring 4.2 x 5.4 x 5.3 cm. She had no pelvic lymphadenopathy, no carcinomatosis, and CA 125 was normal at 21U/mL.  Her infection was adequately treated with IV antibiotics and her acute kidney injury which manifested with an elevated creatinine 12.4 spontaneously resolved during her hospital stay with avoidance of NSAID and good hydration. She was noted to be anemic during her hospitalization with a hemoglobin of 10.8. However this  did not require transfusion and spontaneously improved over time.   On 03/13/2015 she underwent an exploratory laparotomy TAH/BSO and radical tumor debulking for a stage IIIc carcinosarcoma of the right ovary and an incidentally found synchronous stage I a endometrial cancer. Tumor was found with a large right ovarian mass, smaller left ovarian mass and a carcinosarcoma implant was involving the anterior cul-de-sac between the bladder and lower uterine segment. All disease sites were completely resected with no macroscopic disease Rainey at the completion of debulking.   She went on to receive 6 cycles of adjuvant chemotherapy with carboplatin and paclitaxel between 04/12/15 and 08/02/15. She tolerated treatment well with some neutropenia. CA 125 was normal at 4.1 at completion of therapy in March, 2017. On 08/23/15 she underwent post-treatment CT imaging which showed: there is a 2.5 x 1.6 cm soft tissue nodule near the vaginal cuff. Within the right hemipelvis there is a 1.8 cm soft tissue nodule.   Repeat CT on 11/21/15 showed that the small bilateral deep pelvic side wall soft tissue masses have decreased in size and are less suggestive of persistent/recurrent disease.  She has a stable small left ventral hernia.   Interval History:   CA 125 in October, 2017 normal.  CT abdo/pelvis in May, 2018 showed no evidence of recurrent disease and CA 125 in May, 2018 was normal at 3.4  In October 2018 she was hospitalized with a left lower extremity DVT and PE. She was started on Lovenox. CT chest showed no lesions consistent with metastatic cancer.  She has had 2 weeks of right lower quadrant abdominal pain - cramping, menstrual like cramps  Current Meds:  Outpatient Encounter Prescriptions as of 12/12/2016  Medication Sig  . acetaminophen (TYLENOL) 500 MG tablet Take 1,000 mg by mouth every 6 (six) hours as needed for mild pain, moderate pain, fever or headache. Reported on 09/10/2015  . cetirizine  (ZYRTEC) 10 MG tablet Take 1 tablet (10 mg total) by mouth daily as needed for allergies (itching).  . gabapentin (NEURONTIN) 100 MG capsule Take 100 mg by mouth at bedtime as needed.   . Iron-Vitamins (GERITOL COMPLETE) TABS Take 1 tablet by mouth daily.  Marland Kitchen lidocaine-prilocaine (EMLA) cream Apply to PAC site 1-2 hours prior to access as needed.  . Oral Electrolytes ([employer] ELECTROLYTE SOLUTION PO) Take 20-40 drops by mouth daily. Place 20-40 drops in liquid daily  . pantoprazole (PROTONIX) 40 MG tablet Take 1 tablet (40 mg total) by mouth daily.  . Rivaroxaban 15 & 20 MG TBPK Take as directed on package: Start with one 15mg  tablet by mouth twice a day with food. On Day 22, switch to one 20mg  tablet once a day with food.  . triamcinolone cream (KENALOG) 0.1 % APPLY TO THE AFFECTED AREA(S) TWICE DAILY AS NEEDED  . [DISCONTINUED] enoxaparin (LOVENOX) 150 MG/ML injection Inject 1 mL (150 mg total) into the skin every 12 (twelve) hours.   Facility-Administered Encounter Medications as of 12/12/2016  Medication  . sodium chloride flush (NS) 0.9 % injection 10 mL  . sodium chloride flush (NS) 0.9 % injection 10 mL    Allergy:  Allergies  Allergen Reactions  . Penicillins Rash    Has patient had a PCN reaction causing immediate rash, facial/tongue/throat swelling, SOB or lightheadedness with hypotension: no Has patient had a PCN reaction causing severe rash involving mucus membranes or skin necrosis: no Has patient had a PCN reaction that required hospitalization in the hospital at the time Has patient had a PCN reaction occurring within the last 10 years: no If all of the above answers are "NO", then may proceed with Cephalosporin use.     Social Hx:   Social History   Social History  . Marital status: Legally Separated    Spouse name: N/A  . Number of children: N/A  . Years of education: N/A   Occupational History  . Not on file.   Social History Main Topics  . Smoking status: Never  Smoker  . Smokeless tobacco: Never Used  . Alcohol use 0.0 oz/week     Comment: Maybe wine every 6 months  . Drug use: No  . Sexual activity: Yes     Comment: Told she could not have more children in 1986   Other Topics Concern  . 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   Married x 23 years, recently separated (4/15).  No domestic violence.  + financial stress.     Lives previously with husband who moved out, now with daughter who has medical problems, including Hodgkin's disease, and granddaughter (age 29).     Uses seat belt.     Past Surgical Hx:  Past Surgical History:  Procedure Laterality Date  . ABDOMINAL HYSTERECTOMY N/A 03/13/2015   Procedure: TOTAL HYSTERECTOMY ABDOMINAL BILATERAL SALPINGO OOPHORECTOMY RADICAL TUMOR [place];  Surgeon: Everitt Amber, MD;  Location: [facility];  Service: Gynecology;  Laterality: N/A;  . BOWEL RESECTION  03/13/2015   Procedure: SMALL BOWEL RESECTION;  Surgeon: Everitt Amber, MD;  Location: [facility];  Service: Gynecology;;  . [place] and 1984  . COLOSTOMY  2008  . COLOSTOMY TAKEDOWN    . LAPAROTOMY N/A 03/13/2015   Procedure: EXPLORATORY LAPAROTOMY;  Surgeon: Everitt Amber, MD;  [facility]: [facility];  Service: Gynecology;  Laterality: N/A;  . TONSILLECTOMY AND ADENOIDECTOMY  1997  . VENTRAL HERNIA REPAIR  03/13/2015   Procedure: HERNIA REPAIR VENTRAL ADULT;  Surgeon: Everitt Amber, MD;  [facility]: [facility];  Service: Gynecology;;    Past Medical Hx:  Past Medical History:  Diagnosis Date  . Anemia   . Cough   . Diverticulitis with perforation 2010  . DVT (deep vein thrombosis) in pregnancy ([place]) 11/24/2016  . DVT, lower extremity ([place])   . Endometrial cancer ([place])   . Family history of adverse reaction to anesthesia    pts daughter had difficulty awakening following anesthesia   . GERD (gastroesophageal reflux disease)   .  Headache   . History of bronchitis   . History of ear infections   . Hx of migraines   . Hypertension   . IBS (irritable bowel syndrome)   . Kidney infection   . Morbid obesity ([place])   . Ovarian cancer ([place]) dx'd 01/2015  . Pyelonephritis     Past Gynecological History:  C/s x 2  Patient's last menstrual period was 10/26/2013.  Family Hx:  Family History  Problem Relation Age of Onset  . Diabetes Mother   . Hypertension Mother   . Hyperlipidemia Mother   . Colon polyps Mother        4 total  . Fibroids Mother        s/p hysterectomy  . Hyperlipidemia Father   . Diabetes Daughter   . Hodgkin's lymphoma Daughter 7  . Asthma Maternal Aunt        severe - d. 47  . Prostate cancer Maternal Uncle 5  . Diabetes Paternal Aunt   . Diabetes Paternal Uncle   . Kidney failure Maternal Grandmother 84  . Pancreatic cancer Paternal Grandmother        dx. >50  . Diabetes Paternal Grandfather   . Diabetes Paternal Aunt   . Pancreatic cancer Cousin 21       paternal 1st cousin  . Heart disease Neg Hx   . Stroke Neg Hx     Review of Systems:  Constitutional  Feels well,    ENT Normal appearing ears and nares bilaterally Skin/Breast  No rash, sores, jaundice, itching, dryness Cardiovascular  No chest pain, shortness of breath, or edema  Pulmonary  No cough or wheeze.  Gastro Intestinal  No nausea, vomitting, or diarrhoea. No bright red blood per rectum, nochange in bowel movement, or constipation.  Genito Urinary  No frequency, urgency, dysuria,  Musculo Skeletal  + bilateral knee pain with ambulation Neurologic  No weakness, numbness, change in gait,  Psychology  No depression, anxiety, insomnia.   Vitals:  Blood pressure 132/79, pulse 75, temperature 98.1 F (36.7 C), temperature source Oral, resp. rate 18, weight (!) 171.8 kg (378 lb 11.2 oz), last menstrual period 10/26/2013, SpO2 100 %.  Physical Exam: WD in NAD Neck  Supple NROM, without any enlargements.   Lymph Node Survey No cervical supraclavicular or inguinal adenopathy Cardiovascular  Pulse normal rate, regularity and rhythm. S1 and S2 normal.  Lungs  Clear to auscultation bilateraly, without  wheezes/crackles/rhonchi. Good air movement.  Skin  No rash/lesions/breakdown  Psychiatry  Alert and oriented to person, place, and time  Abdomen  Normoactive bowel sounds, abdomen soft, non-tender and obese without palpable evidence of hernia. There is a large midline incision which is healed. Back No CVA tenderness Genito Urinary  Vulva/vagina: Normal external female genitalia.  No lesions. No discharge or bleeding.  Bladder/urethra:  No lesions or masses, well supported bladder  Vagina: cuff in tact.  Cervix and uterus: surgically absent  Adnexa: no palpable masses. Rectal  Good tone, no masses no cul de sac nodularity.  Extremities  No bilateral cyanosis, clubbing or edema.   Donaciano Eva, MD  12/12/2016, 4:24 PM

## 2016-12-12 NOTE — Progress Notes (Deleted)
Office Visit Note  Patient: [name]             Date of Birth: [birth year]           MRN: [id number]             PCP: Alveda Reasons, MD Referring: Lupita Dawn, MD Visit Date: 12/18/2016 Occupation: '@GUAROCC' @    Subjective:  No chief complaint on file.   History of Present Illness: [name] is a 51 y.o. female ***   Activities of Daily Living:  Patient reports morning stiffness for *** {minute/hour:19697}.   Patient {ACTIONS;DENIES/REPORTS:21021675::"Denies"} nocturnal pain.  Difficulty dressing/grooming: {ACTIONS;DENIES/REPORTS:21021675::"Denies"} Difficulty climbing stairs: {ACTIONS;DENIES/REPORTS:21021675::"Denies"} Difficulty getting out of chair: {ACTIONS;DENIES/REPORTS:21021675::"Denies"} Difficulty using hands for taps, buttons, cutlery, and/or writing: {ACTIONS;DENIES/REPORTS:21021675::"Denies"}   No Rheumatology ROS completed.   PMFS History:  Patient Active Problem List   Diagnosis Date Noted  . DVT (deep vein thrombosis) in pregnancy ([place]) 11/24/2016  . Acute pulmonary embolism ([place]) 11/23/2016  . Fatigue 11/13/2016  . Elevated antinuclear antibody (ANA) level 09/23/2016  . Eczema 01/27/2016  . Genetic testing 10/29/2015  . Portacath in place 09/12/2015  . Long term current use of anticoagulant therapy 09/06/2015  . Slow rate of speech 08/22/2015  . Chemotherapy-induced peripheral neuropathy ([place]) 06/13/2015  . CKD (chronic kidney disease) stage 3, GFR 30-59 ml/min 05/12/2015  . Constipation 05/12/2015  . Esophageal reflux 05/12/2015  . Iron deficiency anemia due to chronic blood loss 04/02/2015  . Ovarian carcinosarcoma, right ([place]) 03/29/2015  . Endometrial ca ([place]) 03/29/2015  . Ventral hernia without obstruction or gangrene   . Morbid obesity with BMI of 60.0-69.9, adult ([place]) 02/19/2015  . Essential hypertension, benign   . Stress at home 12/14/2014  . Left knee pain 08/04/2013  . Seasonal allergies 07/29/2013  . Right  knee pain 11/28/2011    Past Medical History:  Diagnosis Date  . Anemia   . Cough   . Diverticulitis with perforation 2010  . DVT (deep vein thrombosis) in pregnancy ([place]) 11/24/2016  . DVT, lower extremity ([place])   . Endometrial cancer ([place])   . Family history of adverse reaction to anesthesia    pts daughter had difficulty awakening following anesthesia   . GERD (gastroesophageal reflux disease)   . Headache   . History of bronchitis   . History of ear infections   . Hx of migraines   . Hypertension   . IBS (irritable bowel syndrome)   . Kidney infection   . Morbid obesity ([place])   . Ovarian cancer ([place]) dx'd 01/2015  . Pyelonephritis     Family History  Problem Relation Age of Onset  . Diabetes Mother   . Hypertension Mother   . Hyperlipidemia Mother   . Colon polyps Mother        4 total  . Fibroids Mother        s/p hysterectomy  . Hyperlipidemia Father   . Diabetes Daughter   . Hodgkin's lymphoma Daughter 31  . Asthma Maternal Aunt        severe - d. 55  . Prostate cancer Maternal Uncle 17  . Diabetes Paternal Aunt   . Diabetes Paternal Uncle   . Kidney failure Maternal Grandmother 84  . Pancreatic cancer Paternal Grandmother        dx. >50  . Diabetes Paternal Grandfather   . Diabetes Paternal Aunt   . Pancreatic cancer Cousin 32       paternal 1st cousin  .  Heart disease Neg Hx   . Stroke Neg Hx    Past Surgical History:  Procedure Laterality Date  . ABDOMINAL HYSTERECTOMY N/A 03/13/2015   Procedure: TOTAL HYSTERECTOMY ABDOMINAL BILATERAL SALPINGO OOPHORECTOMY RADICAL TUMOR [place];  Surgeon: Everitt Amber, MD;  [facility];  Service: Gynecology;  Laterality: N/A;  . BOWEL RESECTION  03/13/2015   Procedure: SMALL BOWEL RESECTION;  Surgeon: Everitt Amber, MD;  Location: [facility];  Service: Gynecology;;  . [place] and 1984  . COLOSTOMY  2008  . COLOSTOMY TAKEDOWN    . LAPAROTOMY N/A 03/13/2015   Procedure: EXPLORATORY LAPAROTOMY;  Surgeon:  Everitt Amber, MD;  [facility];  Service: Gynecology;  Laterality: N/A;  . TONSILLECTOMY AND ADENOIDECTOMY  1997  . 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   Married x 23 years, recently separated (4/15).  No domestic violence.  + financial stress.     Lives previously with husband who moved out, now with daughter who has medical problems, including Hodgkin's disease, and granddaughter (age 52).     Uses seat belt.      Objective: Vital Signs: LMP 10/26/2013    Physical Exam   Musculoskeletal Exam: ***  CDAI Exam: No CDAI exam completed.    Investigation: Findings:  09/08/2016 ANA positive, no titer performed, ESR 25, and CRP normal    CBC Latest Ref Rng & Units 11/25/2016 11/24/2016 11/23/2016  WBC 4.0 - 10.5 K/uL 9.1 10.4 11.9(H)  Hemoglobin 12.0 - 15.0 g/dL 10.8(L) 10.9(L) 11.9(L)  Hematocrit 36.0 - 46.0 % 32.0(L) 32.8(L) 35.9(L)  Platelets 150 - 400 K/uL 172 162 166   CMP Latest Ref Rng & Units 11/25/2016 11/24/2016 11/23/2016  Glucose 65 - 99 mg/dL 106(H) 102(H) 115(H)  BUN 6 - 20 mg/dL 26(H) 22(H) 23(H)  Creatinine 0.44 - 1.00 mg/dL 1.29(H) 1.26(H) 1.17(H)  Sodium 135 - 145 mmol/L 137 139 140  Potassium 3.5 - 5.1 mmol/L 4.4 4.4 4.5  Chloride 101 - 111 mmol/L 108 110 111  CO2 22 - 32 mmol/L 23 22 20(L)  Calcium 8.9 - 10.3 mg/dL 8.8(L) 9.1 9.6  Total Protein 6.5 - 8.1 g/dL - - 7.0  Total Bilirubin 0.3 - 1.2 mg/dL - - 0.5  Alkaline Phos 38 - 126 U/L - - 73  AST 15 - 41 U/L - - 14(L)  ALT 14 - 54 U/L - - 12(L)     Imaging: Dg Chest 2 View  Result Date: 11/23/2016 CLINICAL DATA:  Fever for several days. EXAM: CHEST  2 VIEW COMPARISON:  March 07, 2015 FINDINGS: The right Port-A-Cath is in good  position. No pneumothorax. The cardiomediastinal silhouette is unchanged. No pulmonary nodules, masses, or focal infiltrates. IMPRESSION: No active cardiopulmonary disease. Electronically Signed   By: Dorise Bullion III M.D   On: 11/23/2016 15:39   Ct Angio Chest Pe W And/or Wo Contrast  Result Date: 11/23/2016 CLINICAL DATA:  Fever and chest pain. History of prior pulmonary embolus EXAM: CT ANGIOGRAPHY CHEST WITH CONTRAST TECHNIQUE: Multidetector CT imaging of the chest was performed using the standard protocol during bolus administration of intravenous contrast. Multiplanar CT image reconstructions and MIPs were obtained to evaluate the vascular anatomy. CONTRAST:  60 mL Isovue 370 nonionic COMPARISON:  CT angiogram chest Aug 23, 2015; chest radiograph November 23, 2016 FINDINGS: Cardiovascular: There is extensive pulmonary embolus. Pulmonary embolus arises in the midportion main right pulmonary artery with extension of pulmonary embolus into multiple right lower lobe pulmonary artery branches. On the left, there is pulmonary embolus arising from the distal main pulmonary artery extending into several lower lobe pulmonary arteries as well as into the proximal left upper lobe pulmonary artery. The main pulmonary outflow tract measures 3.3 cm, a finding likely indicative of pulmonary arterial hypertension. The right ventricle to left ventricle diameter ratio is less than 0.9, a finding not indicative of right heart strain. Central catheter tip is at the cavoatrial junction. There is no appreciable thoracic aortic aneurysm or dissection. The visualized great vessels appear normal. Pericardium is not appreciably thickened. Mediastinum/Nodes: The visualized thyroid appears normal. There is no appreciable thoracic adenopathy. There is a focal hiatal hernia. Lungs/Pleura: There is no appreciable edema or consolidation. No pulmonary infarct is demonstrable on this study. There is mild atelectasis in the left upper  lobe. There is no pleural effusion or pleural thickening evident. Upper Abdomen: Visualized upper abdominal structures appear unremarkable except for mild left adrenal hypertrophy. Musculoskeletal: There is degenerative change in the thoracic spine. There are no blastic or lytic bone lesions. Review of the MIP images confirms the above findings. IMPRESSION: 1. Extensive pulmonary embolism bilaterally without right heart strain demonstrated. 2. Prominence of the main pulmonary outflow tract, likely indicative of pulmonary arterial hypertension. 3.  No appreciable thoracic aortic aneurysm or dissection. 4. Slight atelectasis left upper lobe. No lung edema or consolidation. No pleural effusion. No pulmonary infarct evident. 5.  No demonstrable adenopathy. 6.  Mild left adrenal hypertrophy. 7.  Focal hiatal hernia. Critical Value/emergent results were called by telephone at the time of interpretation on 11/23/2016 at 6:22 pm to Dr. Dorie Rank , who verbally acknowledged these results. Electronically Signed   By: Lowella Grip III M.D.   On: 11/23/2016 18:23    Speciality Comments: No specialty comments available.    Procedures:  No procedures performed Allergies: Penicillins   Assessment / Plan:     Visit Diagnoses: Elevated antinuclear antibody (ANA) level  Other fatigue  Chronic pain of right knee  Chronic pain of left knee  History of DVT (deep vein thrombosis)  History of pulmonary embolism  Morbid obesity with BMI of 60.0-69.9, adult ([facility])  History of chronic kidney disease  History of anemia  History of gastroesophageal reflux (GERD)  History of ovarian cancer  History of peripheral neuropathy induced by chemotherapy   History of depression    Orders: No orders of the defined types were placed in this encounter.  No orders of the defined types were placed in this encounter.   Face-to-face time spent with patient was *** minutes. 50% of time was spent in counseling and  coordination of care.  Follow-Up Instructions: No Follow-up on file.   Mickaela Starlin, RT  Note - This record has been created using [employer].  Chart creation errors have been sought, but may not always  have been located. Such creation errors do not reflect on  the standard of medical care.

## 2016-12-12 NOTE — Patient Instructions (Signed)
Please notify Dr Denman George at phone number [phone] if you notice vaginal bleeding, new pelvic or abdominal pains, bloating, feeling full easy, or a change in bladder or bowel function.   Please follow-up with Dr Denman George in December.

## 2016-12-13 LAB — CA 125: Cancer Antigen (CA) 125: 3 U/mL (ref 0.0–38.1)

## 2016-12-18 ENCOUNTER — Ambulatory Visit: Payer: Self-pay | Admitting: Rheumatology

## 2016-12-18 ENCOUNTER — Telehealth: Payer: Self-pay | Admitting: Family Medicine

## 2016-12-18 NOTE — Telephone Encounter (Signed)
Pt had appt today at Dr Estanislado Pandy at 8:15.  She was late because of getting lost due to goggle maps.  The appt was rescheduled Feb 2019. Pt would like to know if Dr Mingo Amber can get her in sooner. Please advise

## 2016-12-19 NOTE — Telephone Encounter (Signed)
I'm not sure we can get her seen any earlier.  I did not put in this referral -- on record review, it looks like this was placed back in July.  If she continues to have joint pain she should come back in and we can get some more detailed labs and attempt to treat to her pain.

## 2016-12-25 ENCOUNTER — Other Ambulatory Visit: Payer: Self-pay | Admitting: Family Medicine

## 2016-12-25 DIAGNOSIS — C561 Malignant neoplasm of right ovary: Secondary | ICD-10-CM

## 2016-12-25 NOTE — Telephone Encounter (Signed)
Patient need refill on Xarelto 20 mg tablet for daily dose.  Patient is complete with starter pack.  Derl Barrow, RN

## 2016-12-25 NOTE — Telephone Encounter (Signed)
Contacted pt and gave her the below message and she stated that she need some refills on her medication.  She stated that she needed a refill on her protonix and also her xarelto.  She said the pharmacy stated that it was only for 21 days (a starter kit, she called it).  I told her that according to her last OV she was supposed to follow up with Dr. Mingo Amber. She may contact her pharmacy to have them submit refills but I told her I would send a message to the doctor.  Also told her she could discuss her xarelto medicine at her visit. She has an appointment on 12/29/2016. Routing to PCP. Katharina Caper, April D, [place]

## 2016-12-25 NOTE — Telephone Encounter (Signed)
LVM for pt to call back to inform her of below. Zimmerman Rumple, Norvella Loscalzo D, CMA  

## 2016-12-26 MED ORDER — RIVAROXABAN 20 MG PO TABS: 20.0000 mg | ORAL_TABLET | Freq: Every day | ORAL | 3 refills | Status: DC

## 2016-12-26 MED FILL — XARELTO 20 MG TABLET: 20 | 30 days supply | Qty: 30 | Fill #0

## 2016-12-26 MED FILL — PANTOPRAZOLE SOD DR 40 MG T: 40 | 30 days supply | Qty: 30 | Fill #0

## 2016-12-26 NOTE — Telephone Encounter (Signed)
I refilled her xarelto and protonix.  She canNOT miss any doses of her xarelto because of her blood clot.

## 2016-12-26 NOTE — Telephone Encounter (Signed)
Refilled both protonix and xarelto

## 2016-12-26 NOTE — Telephone Encounter (Signed)
Called pt earlier today and gave her this message and then the phone died. Tried again just now and same thing is happening, phone is not doing anything.  If she calls back please let her know that she should still keep her upcoming appointment with her PCP. Katharina Caper, Solstice Lastinger D, [place]

## 2016-12-29 ENCOUNTER — Ambulatory Visit (INDEPENDENT_AMBULATORY_CARE_PROVIDER_SITE_OTHER): Payer: BLUE CROSS/BLUE SHIELD | Admitting: Family Medicine

## 2016-12-29 ENCOUNTER — Encounter: Payer: Self-pay | Admitting: Family Medicine

## 2016-12-29 VITALS — BP 122/68 | HR 71 | Temp 97.7°F | Ht 65.0 in | Wt 370.8 lb

## 2016-12-29 DIAGNOSIS — I2699 Other pulmonary embolism without acute cor pulmonale: Secondary | ICD-10-CM | POA: Diagnosis not present

## 2016-12-29 DIAGNOSIS — G8929 Other chronic pain: Secondary | ICD-10-CM

## 2016-12-29 DIAGNOSIS — R252 Cramp and spasm: Secondary | ICD-10-CM

## 2016-12-29 DIAGNOSIS — C561 Malignant neoplasm of right ovary: Secondary | ICD-10-CM | POA: Diagnosis not present

## 2016-12-29 DIAGNOSIS — M791 Myalgia, unspecified site: Secondary | ICD-10-CM

## 2016-12-29 DIAGNOSIS — M25562 Pain in left knee: Secondary | ICD-10-CM | POA: Diagnosis not present

## 2016-12-29 DIAGNOSIS — Z6841 Body Mass Index (BMI) 40.0 and over, adult: Secondary | ICD-10-CM

## 2016-12-29 DIAGNOSIS — R768 Other specified abnormal immunological findings in serum: Secondary | ICD-10-CM | POA: Diagnosis not present

## 2016-12-29 MED ORDER — RIVAROXABAN 20 MG PO TABS: 20.0000 mg | ORAL_TABLET | Freq: Every day | ORAL | 6 refills | Status: DC

## 2016-12-29 NOTE — Progress Notes (Signed)
Subjective:    [name] is a 51 y.o. female who presents to [facility] today for Follow-up for Xarelto refills:  1.  VTE:  Patient presented with shortness of breath and left lower extremity pain and swelling about a month ago to the emergency department. She was found to have a left lower extremity DVT plus pulmonary embolism. At that point she was started on Xarelto. She is followed up with our clinic here and also started on Lovenox because there is some concern that this may been cancer induced. However she has been in remission the past year or so from her cancer. VTE thought to be secondary to venous stasis. She is on day 23 of her Xarelto and therefore stopped the twice a day 50 mg and is now the once a day 20 mg. She's had no bleeding that she's noticed. No lower extremity swelling. No dyspnea.  2. Bilateral muscle aches and hand cramping: Ongoing issue since patient started chemotherapy. She is concern for a autoimmune disease or some other systemic issue. She is referred to rheumatology back in June but missed her most recent appointment at her next will not be scheduled until January. She is concerned because of her symptoms. She can experience cramping and contractions of her hands with repetitive movement. No injuries. No hand numbness or tingling.  3. Knee pain: This is been going for the past 1-2 years. She's been followed by orthopedics. She is not yet a surgical candidate due to her weight. She has attempted to lose weight through the ketogenic diet. She tells me today that she is down 12 pounds.  She says that her right knee is "bone-on-bone" and she will not be receiving any more corticosteroid shots. She has a follow-up appointment with her orthopedist in a couple of weeks. In the meantime she is continuing to try to lose weight. No recent falls. No injuries.  Otherwise, she reports a definite change in her memory after the chemotherapy.    ROS as above per HPI.   The following  portions of the patient's history were reviewed and updated as appropriate: allergies, current medications, past medical history, family and social history, and problem list. Patient is a nonsmoker.    PMH reviewed.  Past Medical History:  Diagnosis Date  . Anemia   . Cough   . Diverticulitis with perforation 2010  . DVT (deep vein thrombosis) in pregnancy ([place]) 11/24/2016  . DVT, lower extremity ([place])   . Endometrial cancer ([place])   . Family history of adverse reaction to anesthesia    pts daughter had difficulty awakening following anesthesia   . GERD (gastroesophageal reflux disease)   . Headache   . History of bronchitis   . History of ear infections   . Hx of migraines   . Hypertension   . IBS (irritable bowel syndrome)   . Kidney infection   . Morbid obesity ([place])   . Ovarian cancer ([place]) dx'd 01/2015  . Pyelonephritis    Past Surgical History:  Procedure Laterality Date  . ABDOMINAL HYSTERECTOMY N/A 03/13/2015   Procedure: TOTAL HYSTERECTOMY ABDOMINAL BILATERAL SALPINGO OOPHORECTOMY RADICAL TUMOR [place];  Surgeon: Everitt Amber, MD;  Location: [facility];  Service: Gynecology;  Laterality: N/A;  . BOWEL RESECTION  03/13/2015   Procedure: SMALL BOWEL RESECTION;  Surgeon: Everitt Amber, MD;  Location: [facility];  Service: Gynecology;;  . [place] and 1984  . COLOSTOMY  2008  . COLOSTOMY TAKEDOWN    . LAPAROTOMY  N/A 03/13/2015   Procedure: EXPLORATORY LAPAROTOMY;  Surgeon: Everitt Amber, MD;  [facility]: [facility];  Service: Gynecology;  Laterality: N/A;  . TONSILLECTOMY AND ADENOIDECTOMY  1997  . VENTRAL HERNIA REPAIR  03/13/2015   Procedure: HERNIA REPAIR VENTRAL ADULT;  Surgeon: Everitt Amber, MD;  [facility]: [facility];  Service: Gynecology;;    Medications reviewed. Current Outpatient Prescriptions  Medication Sig Dispense Refill  . acetaminophen (TYLENOL) 500 MG tablet Take 1,000 mg by mouth every 6 (six) hours as needed for mild pain, moderate pain, fever or headache. Reported on  09/10/2015    . cetirizine (ZYRTEC) 10 MG tablet Take 1 tablet (10 mg total) by mouth daily as needed for allergies (itching). 30 tablet 0  . gabapentin (NEURONTIN) 100 MG capsule Take 100 mg by mouth at bedtime as needed.     . Iron-Vitamins (GERITOL COMPLETE) TABS Take 1 tablet by mouth daily.    Marland Kitchen lidocaine-prilocaine (EMLA) cream Apply to PAC site 1-2 hours prior to access as needed. 30 g 2  . Oral Electrolytes ([employer] ELECTROLYTE SOLUTION PO) Take 20-40 drops by mouth daily. Place 20-40 drops in liquid daily    . pantoprazole (PROTONIX) 40 MG tablet TAKE 1 TABLET BY MOUTH ONCE DAILY 30 tablet 2  . rivaroxaban (XARELTO) 20 MG TABS tablet Take 1 tablet (20 mg total) by mouth daily with supper. 30 tablet 3  . triamcinolone cream (KENALOG) 0.1 % APPLY TO THE AFFECTED AREA(S) TWICE DAILY AS NEEDED 30 g 1   No current facility-administered medications for this visit.    Facility-Administered Medications Ordered in Other Visits  Medication Dose Route Frequency Provider Last Rate Last Dose  . sodium chloride flush (NS) 0.9 % injection 10 mL  10 mL Intravenous PRN Livesay, Lennis P, MD   10 mL at 05/31/15 0300  . sodium chloride flush (NS) 0.9 % injection 10 mL  10 mL Intravenous PRN Livesay, Lennis P, MD   10 mL at 08/02/15 1442     Objective:   Physical Exam BP 122/68   Pulse 71   Temp 97.7 F (36.5 C) (Oral)   Ht 5\' 5"  (1.651 m)   Wt (!) 370 lb 12.8 oz (168.2 kg)   LMP 10/26/2013   SpO2 99%   BMI 61.70 kg/m  Gen:  Alert, cooperative patient who appears stated age in no acute distress.  Vital signs reviewed. HEENT: EOMI,  MMM Cardiac:  Regular rate and rhythm without murmur auscultated.  Good S1/S2. Pulm:  Clear to auscultation bilaterally with good air movement.  No wheezes or rales noted.   Abd:  Soft/nondistended/nontender.  Good bowel sounds throughout all four quadrants.  No masses noted.  Exts: Non edematous BL  LE, warm and well perfused.  Neuro:  Very slight tremor to head.   This is new.  Also with flat affect, almost "masked facies" appearance.  This is also new.   No results found for this or any previous visit (from the past 72 hour(s)).

## 2016-12-29 NOTE — Patient Instructions (Signed)
It was good to see you again today.  We are going to send you for an x-ray of your shoulder and right arm. You can go to the hospital or [facility] imaging for this.  We also doing some blood work today to see if we can get you into rheumatology any more quickly.  I have sent in your Xarelto refills  Take care and have a good rest week

## 2016-12-30 LAB — CBC WITH DIFFERENTIAL/PLATELET
BASOS: 1 %
Basophils Absolute: 0 10*3/uL (ref 0.0–0.2)
EOS (ABSOLUTE): 0.2 10*3/uL (ref 0.0–0.4)
Eos: 2 %
Hematocrit: 37.2 % (ref 34.0–46.6)
Hemoglobin: 12.2 g/dL (ref 11.1–15.9)
IMMATURE GRANS (ABS): 0 10*3/uL (ref 0.0–0.1)
IMMATURE GRANULOCYTES: 0 %
LYMPHS: 31 %
Lymphocytes Absolute: 2 10*3/uL (ref 0.7–3.1)
MCH: 30.2 pg (ref 26.6–33.0)
MCHC: 32.8 g/dL (ref 31.5–35.7)
MCV: 92 fL (ref 79–97)
Monocytes Absolute: 0.4 10*3/uL (ref 0.1–0.9)
Monocytes: 6 %
NEUTROS PCT: 60 %
Neutrophils Absolute: 3.9 10*3/uL (ref 1.4–7.0)
PLATELETS: 217 10*3/uL (ref 150–379)
RBC: 4.04 x10E6/uL (ref 3.77–5.28)
RDW: 15 % (ref 12.3–15.4)
WBC: 6.4 10*3/uL (ref 3.4–10.8)

## 2016-12-30 LAB — COMPREHENSIVE METABOLIC PANEL
A/G RATIO: 1.8 (ref 1.2–2.2)
ALT: 10 IU/L (ref 0–32)
AST: 16 IU/L (ref 0–40)
Albumin: 4.4 g/dL (ref 3.5–5.5)
Alkaline Phosphatase: 91 IU/L (ref 39–117)
BUN/Creatinine Ratio: 23 (ref 9–23)
BUN: 29 mg/dL — AB (ref 6–24)
Bilirubin Total: 0.3 mg/dL (ref 0.0–1.2)
CALCIUM: 9.9 mg/dL (ref 8.7–10.2)
CO2: 19 mmol/L — AB (ref 20–29)
CREATININE: 1.28 mg/dL — AB (ref 0.57–1.00)
Chloride: 104 mmol/L (ref 96–106)
GFR, EST AFRICAN AMERICAN: 56 mL/min/{1.73_m2} — AB (ref >59)
GFR, EST NON AFRICAN AMERICAN: 48 mL/min/{1.73_m2} — AB (ref >59)
GLUCOSE: 102 mg/dL — AB (ref 65–99)
Globulin, Total: 2.4 g/dL (ref 1.5–4.5)
Potassium: 5.1 mmol/L (ref 3.5–5.2)
Sodium: 141 mmol/L (ref 134–144)
TOTAL PROTEIN: 6.8 g/dL (ref 6.0–8.5)

## 2016-12-30 LAB — FANA STAINING PATTERNS: CENTROMERE AB: 1:1280 {titer} — ABNORMAL HIGH

## 2016-12-30 LAB — ANA+ENA+DNA/DS+SCL 70+SJOSSA/B
ANA Titer 1: POSITIVE — AB
ENA SM Ab Ser-aCnc: <0.2 AI (ref 0.0–0.9)
ENA SSA (RO) Ab: <0.2 AI (ref 0.0–0.9)
Scleroderma SCL-70: <0.2 AI (ref 0.0–0.9)

## 2016-12-30 LAB — RHEUMATOID FACTOR

## 2016-12-30 LAB — CK: Total CK: 59 U/L (ref 24–173)

## 2016-12-31 ENCOUNTER — Telehealth: Payer: Self-pay | Admitting: Hematology and Oncology

## 2016-12-31 ENCOUNTER — Encounter: Payer: Self-pay | Admitting: Hematology and Oncology

## 2016-12-31 DIAGNOSIS — R252 Cramp and spasm: Secondary | ICD-10-CM | POA: Insufficient documentation

## 2016-12-31 NOTE — Telephone Encounter (Signed)
Appt has been scheduled for the pt to see Dr. Lebron Conners on 10/ at 2pm. Unable to reach the pt. Letter mailed with the appt date and time. Notified the referring office to contact the pt.

## 2016-12-31 NOTE — Assessment & Plan Note (Signed)
Refill for Xarelto today. She has not missed any doses. She still has a few days left which will carry her over until she is able to get to her pharmacy to pick up this prescription.

## 2016-12-31 NOTE — Assessment & Plan Note (Signed)
Persists.  She is trying to lose weight. She is going back to see her orthopedist in 3 weeks.

## 2016-12-31 NOTE — Assessment & Plan Note (Signed)
We'll obtain more detailed/specific laboratory testing today.

## 2016-12-31 NOTE — Assessment & Plan Note (Signed)
Has noted definite change in memory since this occurred. Also is having trouble with pain. See below.

## 2016-12-31 NOTE — Assessment & Plan Note (Signed)
Doing a good job losing weight. Encouraged to continue

## 2016-12-31 NOTE — Assessment & Plan Note (Addendum)
Unclear how much of this is related to chemotherapy. She temporally relates these things occurred at the same time. We are checking more specific ANA and rheumatoid factor testing today.

## 2017-01-01 ENCOUNTER — Telehealth: Payer: Self-pay | Admitting: Family Medicine

## 2017-01-01 DIAGNOSIS — R768 Other specified abnormal immunological findings in serum: Secondary | ICD-10-CM

## 2017-01-01 NOTE — Telephone Encounter (Signed)
Called and had to leave a voicemail that we are attempting to get patient seen by Rheumatology more quickly based on lab results.  Reassured her nothing overly concerning or scary.

## 2017-01-01 NOTE — Telephone Encounter (Signed)
Patient returned call. Notified of below. Hubbard Hartshorn, RN, BSN

## 2017-01-02 ENCOUNTER — Ambulatory Visit
Admission: RE | Admit: 2017-01-02 | Discharge: 2017-01-02 | Disposition: A | Discharge status: Home / Self Care | Payer: BLUE CROSS/BLUE SHIELD | Source: Ambulatory Visit | Attending: Family Medicine | Admitting: Family Medicine

## 2017-01-02 DIAGNOSIS — R768 Other specified abnormal immunological findings in serum: Secondary | ICD-10-CM

## 2017-01-02 DIAGNOSIS — M791 Myalgia, unspecified site: Secondary | ICD-10-CM

## 2017-01-06 ENCOUNTER — Telehealth: Payer: Self-pay | Admitting: *Deleted

## 2017-01-06 NOTE — Telephone Encounter (Signed)
Patient is calling to speak with her PCP regarding the appointment she has coming up with Rheumatology. Number on file is correct.  Derl Barrow, RN

## 2017-01-09 NOTE — Telephone Encounter (Signed)
Called b/c she had been scheduled for HEME (not Rheum as other note states) appt.  This seems to have been from many months ago when we were trying to figure out xarelto versus lovenox.  As her recent VTE seems not to have been related to any malignancy, will continue with xarelto.  No need for heme referral.  Patient will call and cancel.

## 2017-01-12 ENCOUNTER — Telehealth: Payer: Self-pay | Admitting: Hematology and Oncology

## 2017-01-12 ENCOUNTER — Encounter: Payer: BLUE CROSS/BLUE SHIELD | Admitting: Hematology and Oncology

## 2017-01-12 NOTE — Telephone Encounter (Signed)
Patient called to cancel appointment 10/8.

## 2017-01-19 ENCOUNTER — Ambulatory Visit: Payer: Self-pay | Admitting: Rheumatology

## 2017-01-20 ENCOUNTER — Telehealth: Payer: Self-pay | Admitting: Licensed Clinical Social Worker

## 2017-01-20 NOTE — Progress Notes (Signed)
Call from patient requesting assistance with co-pay to be seen at [facility]. Patient has no resources for co-pay and needs to be seen due to pain.   LCSW called [facility] to confirm amount of co-pay.  $20.00 pay with [facility] indigent fund.  Patient appreciative of the assistance.  [name], LCSW Licensed Clinical Social Worker [facility]   [phone] 2:38 PM

## 2017-01-29 MED FILL — PANTOPRAZOLE SOD DR 40 MG T: 40 | 30 days supply | Qty: 30 | Fill #1

## 2017-01-29 MED FILL — XARELTO 20 MG TABLET: 20 | 30 days supply | Qty: 30 | Fill #1

## 2017-02-05 DIAGNOSIS — M329 Systemic lupus erythematosus, unspecified: Secondary | ICD-10-CM

## 2017-02-05 HISTORY — DX: Systemic lupus erythematosus, unspecified: M32.9

## 2017-03-05 ENCOUNTER — Ambulatory Visit (INDEPENDENT_AMBULATORY_CARE_PROVIDER_SITE_OTHER): Payer: BLUE CROSS/BLUE SHIELD | Admitting: Licensed Clinical Social Worker

## 2017-03-05 DIAGNOSIS — F439 Reaction to severe stress, unspecified: Secondary | ICD-10-CM

## 2017-03-05 NOTE — Progress Notes (Signed)
  Type of Service: [place] office visit Estimate time:35 minutes : Interpreter:No.  SUBJECTIVE: [name] is a 51 y.o. female referred by self for assistance with managing stress. Patient reports recent death of brother and difficulty with managing stressors related to medical conditions.  LIFE CONTEXT:  Family & Social:, patient lives with her daughter and family School / Work /Fun: disability pending  Life changes: death of brother, concerns with her health  GOALS: Patient will reduce : stress, and increase  ability ZY:SAYTKZ skills, self-management skills and stress reduction, will also Begin healthy grieving over loss. INTERVENTION: , Reflective listening, Behavioral Therapy ;  Problem-solving teaching/coping strategies, Supportive Counseling and Referral to Grief Counseling ISSUES DISCUSSED: support system, previous and current coping skills, community resources , things patient enjoy or use to enjoy doing, and Pacing.    ASSESSMENT:Patient currently experiencing stress related to medical concerns and grief over the recent death of her brother. Patient may benefit from, and is in agreement to contact [facility] to participate in group grief therapy to assist with managing her symptoms. Patient will also F/U with [facility] as needed for managing stress. PLAN:   1.Patient will F/U with LCSW in 2 weeks or as needed.  2. Behavioral recommendations: review [place] as well as Pacing when going out  3. Referral:Hospice grief therapy    [name], LCSW Licensed Clinical Social Worker [facility]   [phone] 11:31 AM

## 2017-03-06 MED FILL — PANTOPRAZOLE SOD DR 40 MG T: 40 | 30 days supply | Qty: 30 | Fill #2

## 2017-03-06 MED FILL — XARELTO 20 MG TABLET: 20 | 30 days supply | Qty: 30 | Fill #2

## 2017-03-06 MED FILL — TRIAMCINOLONE ACETONIDE 0.1: 0.1 | 15 days supply | Qty: 30 | Fill #1

## 2017-03-11 ENCOUNTER — Encounter: Payer: Self-pay | Admitting: Gynecologic Oncology

## 2017-03-11 ENCOUNTER — Ambulatory Visit: Payer: BLUE CROSS/BLUE SHIELD | Attending: Gynecologic Oncology | Admitting: Gynecologic Oncology

## 2017-03-11 ENCOUNTER — Other Ambulatory Visit (HOSPITAL_BASED_OUTPATIENT_CLINIC_OR_DEPARTMENT_OTHER): Payer: BLUE CROSS/BLUE SHIELD

## 2017-03-11 VITALS — BP 138/67 | HR 59 | Temp 97.5°F | Resp 20 | Ht 65.5 in | Wt 373.3 lb

## 2017-03-11 DIAGNOSIS — Z90710 Acquired absence of both cervix and uterus: Secondary | ICD-10-CM | POA: Diagnosis not present

## 2017-03-11 DIAGNOSIS — C541 Malignant neoplasm of endometrium: Secondary | ICD-10-CM

## 2017-03-11 DIAGNOSIS — Z9221 Personal history of antineoplastic chemotherapy: Secondary | ICD-10-CM | POA: Diagnosis not present

## 2017-03-11 DIAGNOSIS — Z8543 Personal history of malignant neoplasm of ovary: Secondary | ICD-10-CM | POA: Diagnosis not present

## 2017-03-11 DIAGNOSIS — Z9079 Acquired absence of other genital organ(s): Secondary | ICD-10-CM | POA: Diagnosis not present

## 2017-03-11 DIAGNOSIS — Z86718 Personal history of other venous thrombosis and embolism: Secondary | ICD-10-CM | POA: Insufficient documentation

## 2017-03-11 DIAGNOSIS — K589 Irritable bowel syndrome without diarrhea: Secondary | ICD-10-CM | POA: Diagnosis not present

## 2017-03-11 DIAGNOSIS — Z90722 Acquired absence of ovaries, bilateral: Secondary | ICD-10-CM | POA: Diagnosis not present

## 2017-03-11 DIAGNOSIS — Z86711 Personal history of pulmonary embolism: Secondary | ICD-10-CM | POA: Diagnosis not present

## 2017-03-11 DIAGNOSIS — G43909 Migraine, unspecified, not intractable, without status migrainosus: Secondary | ICD-10-CM | POA: Diagnosis not present

## 2017-03-11 DIAGNOSIS — D649 Anemia, unspecified: Secondary | ICD-10-CM | POA: Insufficient documentation

## 2017-03-11 DIAGNOSIS — C569 Malignant neoplasm of unspecified ovary: Secondary | ICD-10-CM

## 2017-03-11 DIAGNOSIS — Z8542 Personal history of malignant neoplasm of other parts of uterus: Secondary | ICD-10-CM | POA: Diagnosis not present

## 2017-03-11 DIAGNOSIS — Z7901 Long term (current) use of anticoagulants: Secondary | ICD-10-CM

## 2017-03-11 DIAGNOSIS — K219 Gastro-esophageal reflux disease without esophagitis: Secondary | ICD-10-CM | POA: Insufficient documentation

## 2017-03-11 DIAGNOSIS — K439 Ventral hernia without obstruction or gangrene: Secondary | ICD-10-CM | POA: Diagnosis not present

## 2017-03-11 NOTE — Progress Notes (Signed)
Followup of Consult: Gyn-Onc  CC:  Chief Complaint  Patient presents with  . Ovarian carcinosarcoma, unspecified laterality ([place])  . Endometrial ca [facility])    Assessment/Plan:  Ms. NAZIA RHINES  is a 51 y.o.  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  She is s/p exploratory laparotomy and TAH, BSO, optimal cytoreduction to no residual visible disease on 03/13/15. S/p 6 cycles of adjuvant carboplatin and paclitaxel chemotherapy completed 08/02/15. Genetic testing negative.  Hx of PE in May, 2017, with recurrent DVT and PE in October, 2018 - on Lovenox.  Complete response to treatment. No evidence of recurrence.  CA 125 normal in September, 2018 - recheck today   Given that her tumor marker (CA 125) was never elevated, will check CT scan in March, 2019 before transitioning to 6 monthly evaluations.  HPI: Keilyn Haggard is a very pleasant 51 year old woman with history of 2 prior cesarean sections who is seen initially in the hospital as an inpatient consultation at [facility] on 02/19/2015 for complex pelvic masses. The patient is a morbidly obese woman with a BMI 62 kg/m. She was admitted to [facility] on 02/18/2015 with pyelonephritis. CT imaging and then MRI imaging was performed to evaluate for an etiology for pyelonephritis. This identified A large pelvic mass which it to be arising from the right adnexa. It measured 13 x 9 x 17.5 cm and was irregular cystic and solid in nature. A second, but cystic and solid lesion within the left adnexa measuring 4.4 x 6.5 x 5.3 cm. In the third cystic and solid mass was seen anterior to the uterus and superior to the bladder measuring 4.2 x 5.4 x 5.3 cm. She had no pelvic lymphadenopathy, no carcinomatosis, and CA 125 was normal at 21U/mL.  Her infection was adequately treated with IV antibiotics and her acute kidney injury which manifested with an elevated creatinine 12.4 spontaneously  resolved during her hospital stay with avoidance of NSAID and good hydration. She was noted to be anemic during her hospitalization with a hemoglobin of 10.8. However this did not require transfusion and spontaneously improved over time.   On 03/13/2015 she underwent an exploratory laparotomy TAH/BSO and radical tumor debulking for a stage IIIc carcinosarcoma of the right ovary and an incidentally found synchronous stage I a endometrial cancer. Tumor was found with a large right ovarian mass, smaller left ovarian mass and a carcinosarcoma implant was involving the anterior cul-de-sac between the bladder and lower uterine segment. All disease sites were completely resected with no macroscopic disease [place] at the completion of debulking.   She went on to receive 6 cycles of adjuvant chemotherapy with carboplatin and paclitaxel between 04/12/15 and 08/02/15. She tolerated treatment well with some neutropenia. CA 125 was normal at 4.1 at completion of therapy in March, 2017. On 08/23/15 she underwent post-treatment CT imaging which showed: there is a 2.5 x 1.6 cm soft tissue nodule near the vaginal cuff. Within the right hemipelvis there is a 1.8 cm soft tissue nodule.   Repeat CT on 11/21/15 showed that the small bilateral deep pelvic side wall soft tissue masses have decreased in size and are less suggestive of persistent/recurrent disease.  She has a stable small left ventral hernia.   Interval History:   CA 125 in October, 2017 normal.  CT abdo/pelvis in May, 2018 showed no evidence of recurrent disease and CA 125 in May, 2018 was normal at 3.4.  In September, 2018 was  normal at 3.  In October 2018 she was hospitalized with a left lower extremity DVT and PE. She was started on Lovenox. CT chest showed no lesions consistent with metastatic cancer.   She has had 2 weeks of right lower quadrant abdominal pain - cramping, menstrual like cramps  Current Meds:  Outpatient Encounter Medications as of  03/11/2017  Medication Sig  . acetaminophen (TYLENOL) 500 MG tablet Take 1,000 mg by mouth every 6 (six) hours as needed for mild pain, moderate pain, fever or headache. Reported on 09/10/2015  . cetirizine (ZYRTEC) 10 MG tablet Take 1 tablet (10 mg total) by mouth daily as needed for allergies (itching).  . gabapentin (NEURONTIN) 100 MG capsule Take 100 mg by mouth at bedtime as needed.   . Iron-Vitamins (GERITOL COMPLETE) TABS Take 1 tablet by mouth daily.  Marland Kitchen lidocaine-prilocaine (EMLA) cream Apply to PAC site 1-2 hours prior to access as needed.  . Oral Electrolytes ([employer]S ELECTROLYTE SOLUTION PO) Take 20-40 drops by mouth daily. Place 20-40 drops in liquid daily  . pantoprazole (PROTONIX) 40 MG tablet TAKE 1 TABLET BY MOUTH ONCE DAILY  . rivaroxaban (XARELTO) 20 MG TABS tablet Take 1 tablet (20 mg total) by mouth daily with supper.  . triamcinolone cream (KENALOG) 0.1 % APPLY TO THE AFFECTED AREA(S) TWICE DAILY AS NEEDED   Facility-Administered Encounter Medications as of 03/11/2017  Medication  . sodium chloride flush (NS) 0.9 % injection 10 mL  . sodium chloride flush (NS) 0.9 % injection 10 mL    Allergy:  Allergies  Allergen Reactions  . Penicillins Rash    Has patient had a PCN reaction causing immediate rash, facial/tongue/throat swelling, SOB or lightheadedness with hypotension: no Has patient had a PCN reaction causing severe rash involving mucus membranes or skin necrosis: no Has patient had a PCN reaction that required hospitalization in the hospital at the time Has patient had a PCN reaction occurring within the last 10 years: no If all of the above answers are "NO", then may proceed with Cephalosporin use.     Social Hx:   Social History   Socioeconomic History  . Marital status: Legally Separated    Spouse name: Not on file  . Number of children: Not on file  . Years of education: Not on file  . Highest education level: Not on file  Social Needs  . Financial  resource strain: Not on file  . Food insecurity - worry: Not on file  . Food insecurity - inability: Not on file  . Transportation needs - medical: Not on file  . Transportation needs - non-medical: Not on file  Occupational History  . Not on file  Tobacco Use  . Smoking status: Never Smoker  . Smokeless tobacco: Never Used  Substance and Sexual Activity  . Alcohol use: Yes    Alcohol/week: 0.0 oz    Comment: Maybe wine every 6 months  . Drug use: No  . Sexual activity: Yes    Comment: Told she could not have more children in 1986  Other Topics Concern  . 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   Married x 23 years, recently separated (4/15).  No domestic violence.  + financial stress.     Lives previously with husband who moved out, now with daughter who has medical problems, including Hodgkin's disease, and granddaughter (  age 79).     Uses seat belt.     Past Surgical Hx:  Past Surgical History:  Procedure Laterality Date  . ABDOMINAL HYSTERECTOMY N/A 03/13/2015   Procedure: TOTAL HYSTERECTOMY ABDOMINAL BILATERAL SALPINGO OOPHORECTOMY RADICAL TUMOR [place];  Surgeon: Everitt Amber, MD;  [facility];  Service: Gynecology;  Laterality: N/A;  . BOWEL RESECTION  03/13/2015   Procedure: SMALL BOWEL RESECTION;  Surgeon: Everitt Amber, MD;  [facility];  Service: Gynecology;;  . [place] and 1984  . COLOSTOMY  2008  . COLOSTOMY TAKEDOWN    . LAPAROTOMY N/A 03/13/2015   Procedure: EXPLORATORY LAPAROTOMY;  Surgeon: Everitt Amber, MD;  [facility];  Service: Gynecology;  Laterality: N/A;  . TONSILLECTOMY AND ADENOIDECTOMY  1997  . VENTRAL HERNIA REPAIR  03/13/2015   Procedure: HERNIA REPAIR VENTRAL ADULT;  Surgeon: Everitt Amber, MD;  [facility];  Service: Gynecology;;    Past Medical Hx:  Past Medical History:  Diagnosis Date  . Anemia    . Cough   . Diverticulitis with perforation 2010  . DVT (deep vein thrombosis) in pregnancy ([place]) 11/24/2016  . DVT, lower extremity ([place])   . Endometrial cancer ([place])   . Family history of adverse reaction to anesthesia    pts daughter had difficulty awakening following anesthesia   . GERD (gastroesophageal reflux disease)   . Headache   . History of bronchitis   . History of ear infections   . Hx of migraines   . Hypertension   . IBS (irritable bowel syndrome)   . Kidney infection   . Morbid obesity ([place])   . Ovarian cancer ([place]) dx'd 01/2015  . Pyelonephritis     Past Gynecological History:  C/s x 2  Patient's last menstrual period was 10/26/2013.  Family Hx:  Family History  Problem Relation Age of Onset  . Diabetes Mother   . Hypertension Mother   . Hyperlipidemia Mother   . Colon polyps Mother        4 total  . Fibroids Mother        s/p hysterectomy  . Hyperlipidemia Father   . Diabetes Daughter   . Hodgkin's lymphoma Daughter 58  . Asthma Maternal Aunt        severe - d. 17  . Prostate cancer Maternal Uncle 14  . Diabetes Paternal Aunt   . Diabetes Paternal Uncle   . Kidney failure Maternal Grandmother 84  . Pancreatic cancer Paternal Grandmother        dx. >50  . Diabetes Paternal Grandfather   . Diabetes Paternal Aunt   . Pancreatic cancer Cousin 28       paternal 1st cousin  . Heart disease Neg Hx   . Stroke Neg Hx     Review of Systems:  Constitutional  Feels well,    ENT Normal appearing ears and nares bilaterally Skin/Breast  No rash, sores, jaundice, itching, dryness Cardiovascular  No chest pain, shortness of breath, or edema  Pulmonary  No cough or wheeze.  Gastro Intestinal  No nausea, vomitting, or diarrhoea. No bright red blood per rectum, nochange in bowel movement, or constipation.  Genito Urinary  No frequency, urgency, dysuria,  Musculo Skeletal  + bilateral knee pain with ambulation Neurologic  No weakness, numbness,  change in gait,  Psychology  No depression, anxiety, insomnia.   Vitals:  Blood pressure 138/67, pulse (!) 59, temperature (!) 97.5 F (36.4 C), temperature source Oral, resp. rate 20, height  5' 5.5" (1.664 m), weight (!) 373 lb 4.8 oz (169.3 kg), last menstrual period 10/26/2013, SpO2 100 %.  Physical Exam: WD in NAD Neck  Supple NROM, without any enlargements.  Lymph Node Survey No cervical supraclavicular or inguinal adenopathy Cardiovascular  Pulse normal rate, regularity and rhythm. S1 and S2 normal.  Lungs  Clear to auscultation bilateraly, without wheezes/crackles/rhonchi. Good air movement.  Skin  No rash/lesions/breakdown  Psychiatry  Alert and oriented to person, place, and time  Abdomen  Normoactive bowel sounds, abdomen soft, non-tender and obese without palpable evidence of hernia. There is a large midline incision which is healed. Back No CVA tenderness Genito Urinary  Vulva/vagina: Normal external female genitalia.  No lesions. No discharge or bleeding.  Bladder/urethra:  No lesions or masses, well supported bladder  Vagina: cuff in tact.  Cervix and uterus: surgically absent  Adnexa: no palpable masses. Rectal  Good tone, no masses no cul de sac nodularity.  Extremities  No bilateral cyanosis, clubbing or edema.   Donaciano Eva, MD  03/11/2017, 3:00 PM

## 2017-03-11 NOTE — Patient Instructions (Signed)
Please notify Dr Denman George at phone number [phone] if you notice vaginal bleeding, new pelvic or abdominal pains, bloating, feeling full easy, or a change in bladder or bowel function.   Please return to see Dr Denman George in March, 2019.

## 2017-03-12 ENCOUNTER — Telehealth: Payer: Self-pay

## 2017-03-12 LAB — CA 125: Cancer Antigen (CA) 125: 3 U/mL (ref 0.0–38.1)

## 2017-03-12 NOTE — Telephone Encounter (Signed)
Told [name] that her CA-125 level was good and stable at 3.0 per Joylene John, NP.

## 2017-03-18 IMAGING — CT CT ABD-PELV W/ CM
2 of 5 series · 16 of 46 positions shown, 18 images · IV contrast (omnipaque)
Comparison: CT abdomen and pelvis 08/02/2010. Pelvic ultrasound
11/30/2014.

CLINICAL DATA: Right upper and right lower quadrant pain for 6
weeks. Nausea, vomiting, and fever. Mildly elevated white blood cell
count.

EXAM:
CT ABDOMEN AND PELVIS WITH CONTRAST
TECHNIQUE: Multidetector CT imaging of the abdomen and pelvis was performed
using the standard protocol following bolus administration of
intravenous contrast.
CONTRAST:  100mL OMNIPAQUE IOHEXOL 300 MG/ML  SOLN

[Series 2: rtn a/p with · axial · 0.89mm/px · z∈[-478,-83]mm · 13 of 89 slices shown, 15 images]
[im 5/89  soft-tissue]
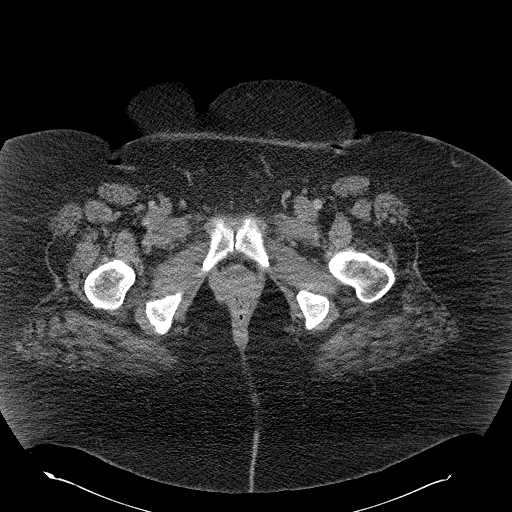
[im 5/89  bone]
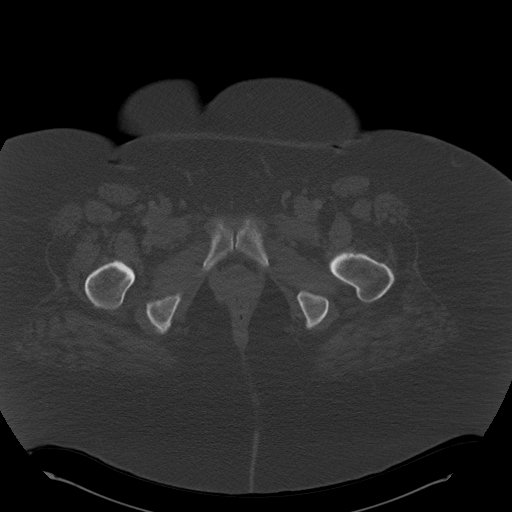
[im 10/89  soft-tissue]
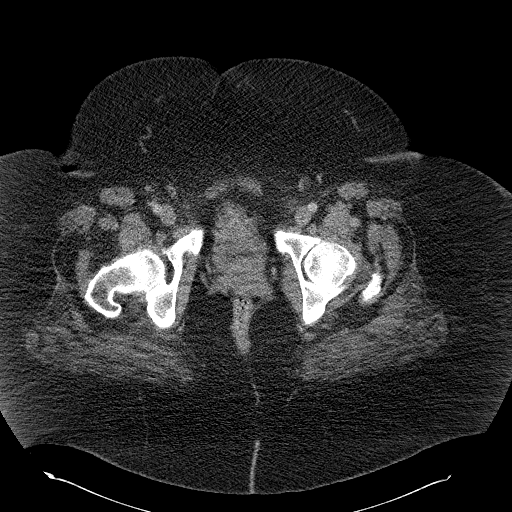
[im 20/89  soft-tissue]
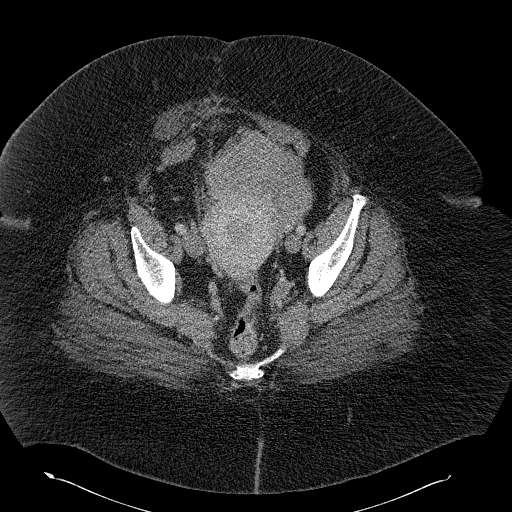
[im 25/89  soft-tissue]
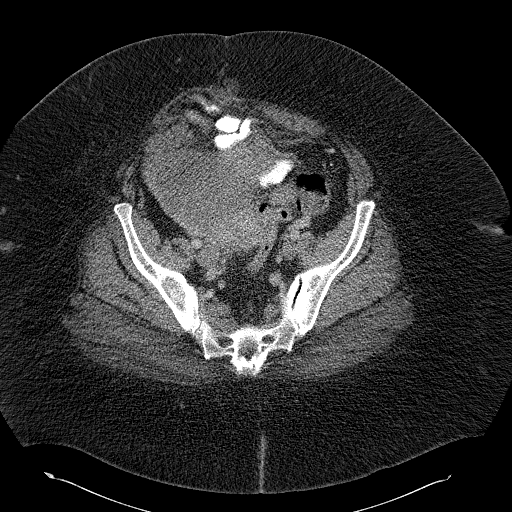
[im 30/89  soft-tissue]
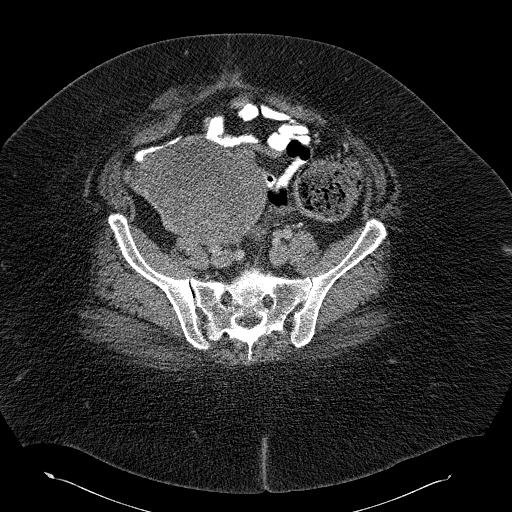
[im 40/89  soft-tissue]
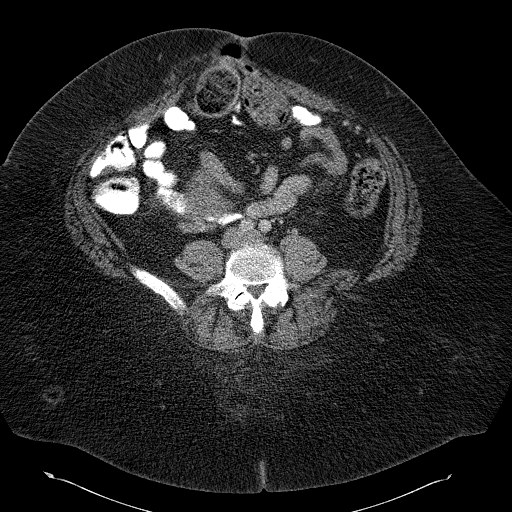
[im 45/89  soft-tissue]
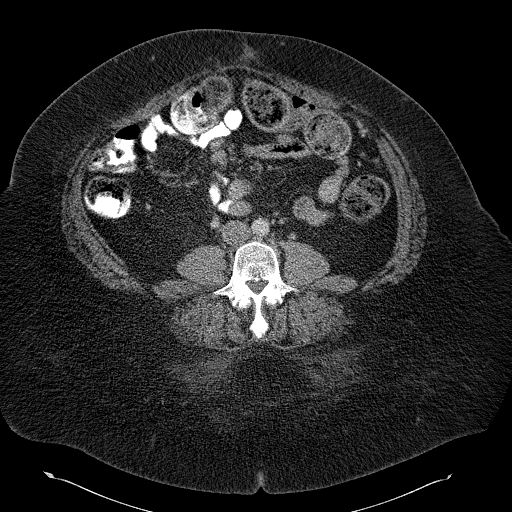
[im 49/89  soft-tissue]
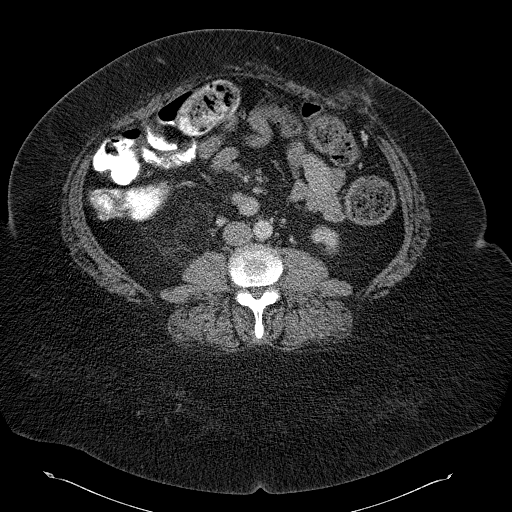
[im 59/89  soft-tissue]
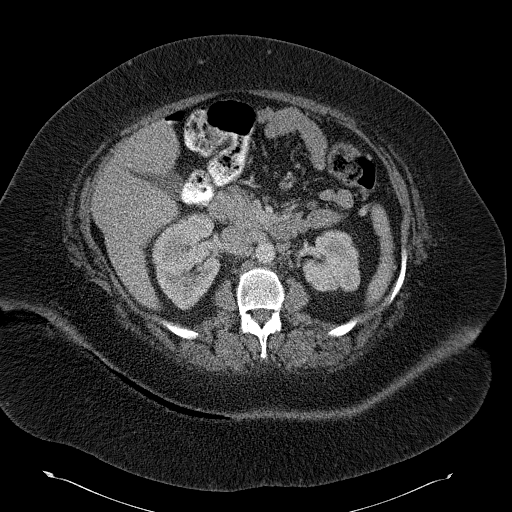
[im 59/89  bone]
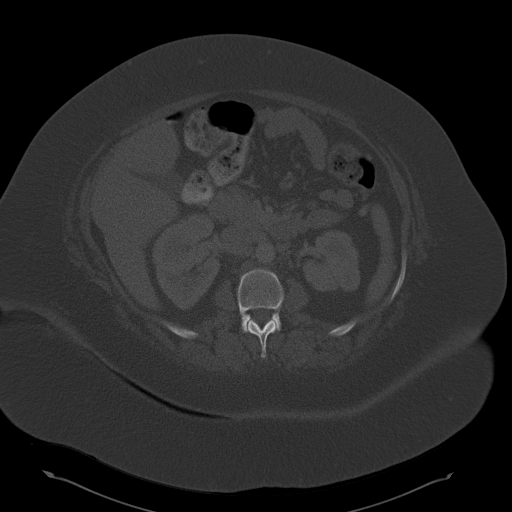
[im 64/89  soft-tissue]
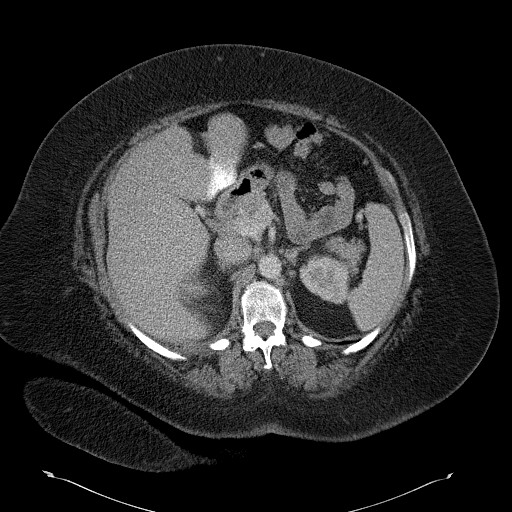
[im 69/89  soft-tissue]
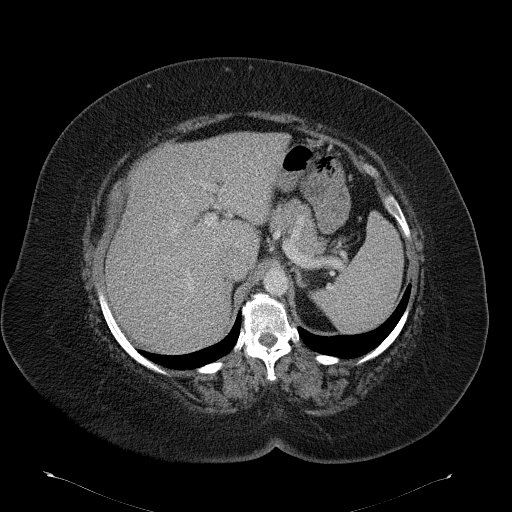
[im 79/89  soft-tissue]
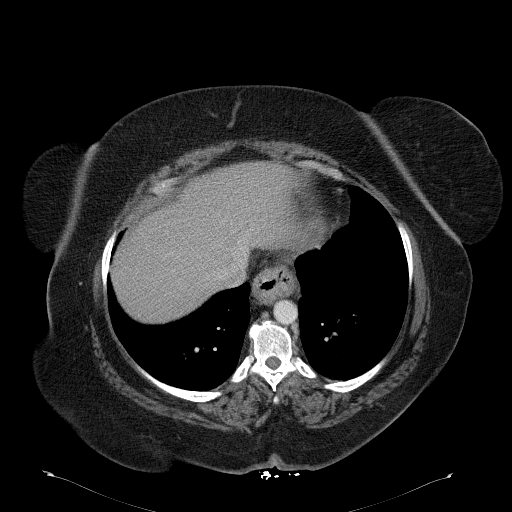
[im 84/89  soft-tissue]
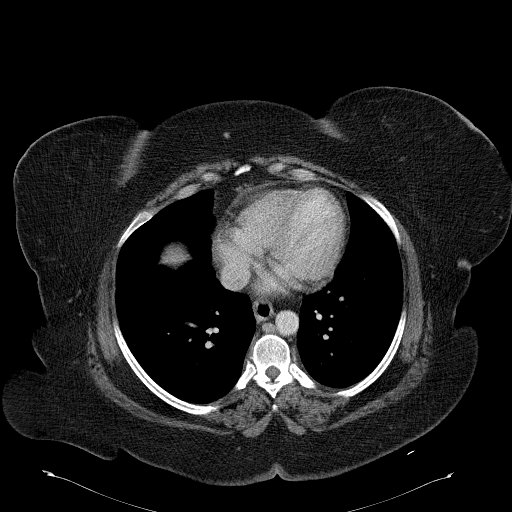

[Series 602: <mpr thick range> · coronal · 0.89mm/px · 3 of 132 slices shown]
[im 44/132  soft-tissue]
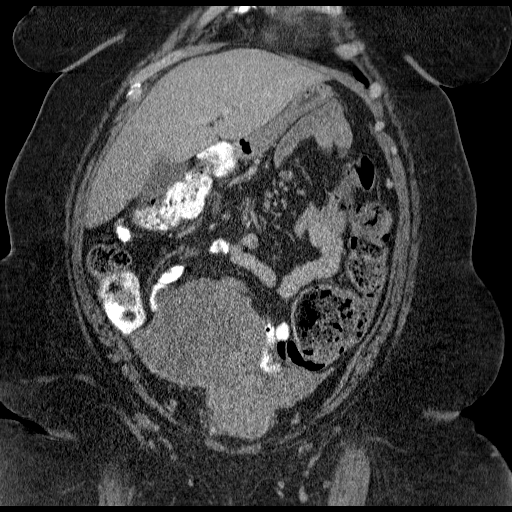
[im 59/132  soft-tissue]
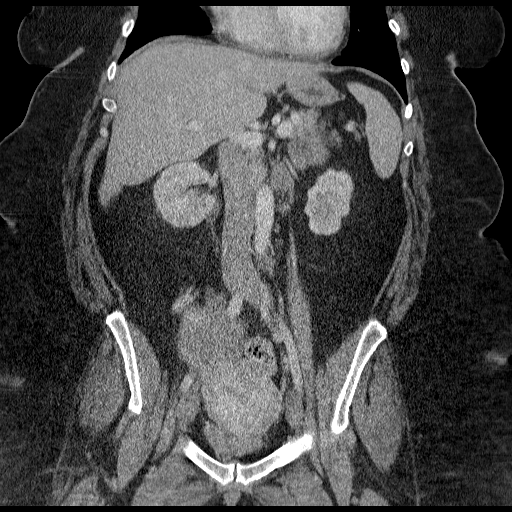
[im 73/132  soft-tissue]
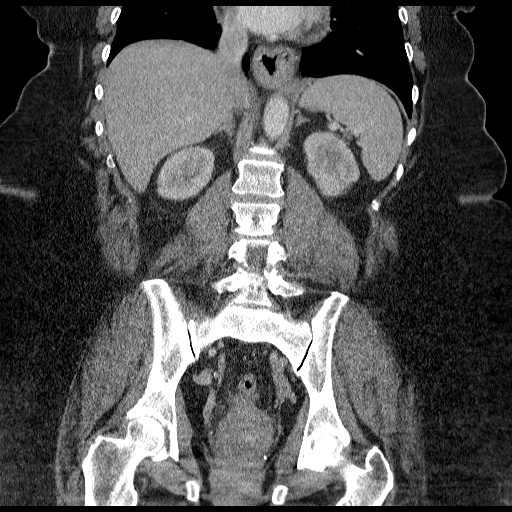

[16 of 46 positions shown; findings below may reference images not displayed]

FINDINGS: The visualized lung bases are clear.

The liver, gallbladder, spleen, adrenal glands, and pancreas are
unremarkable. Slightly lobular contour of the kidneys with subtle
areas of cortical thinning, more conspicuous on the left, may
reflect scarring. Small nonobstructing calculi are present in the
interpolar regions of both kidneys.

There is a small sliding hiatal hernia. A small ventral midline
abdominal hernia is again noted containing a portion of the
transverse colon without evidence of obstruction. A small right
lower quadrant abdominal hernia containing a short segment of small
bowel is also unchanged and without evidence of associated
obstruction. There are 2 additional small abdominal wall hernias
containing only fat, 1 on the right and 1 on the left. The appendix
is not clearly identified.

Within the right adnexa is a large mass measuring approximately
x 9.5 x 10.1 cm. The majority of the mass is low in attenuation and
possibly cystic, with some areas of increased density along its
periphery which may reflect solid components/nodular wall
thickening. There is an additional solid mass more inferiorly and
anteriorly in the inferior pelvis anterior to the uterus which
measures 8.0 x 5.4 cm (series 2, image 71) and may be associated
with the larger right adnexal mass. Abnormal soft tissue extends in
a contiguous fashion below this mass along the anterior aspect of
the uterus over a length of approximately 11 cm. There is a 4.7 cm
focus of heterogeneous soft tissue in the left adnexa which may
reflect involvement of the left ovary. The bladder is not well seen
and may be completely decompressed and partially obscured by the
nearby pelvic soft tissue masses.

A mildly enlarged aortocaval lymph node just below the level of the
renal arteries measures 1.1 cm in short axis (previously 7 mm). No
free fluid is seen. No suspicious lytic or blastic osseous lesions
are identified. Mild thoracolumbar spondylosis is noted.
IMPRESSION: 1. 11.7 cm right adnexal mass (possibly mixed cystic and solid) with
additional masslike soft tissue in the midline anterior pelvis and
left adnexa, overall most concerning for ovarian neoplasm.
Gynecological consultation is recommended, with consideration for
pelvic MRI as clinically warranted.
2. Mildly enlarged 1.1 cm aortocaval lymph node, indeterminate.

These results were called by telephone at the time of interpretation
on 01/30/2015 at [DATE] to Dr. Seberino, who verbally
acknowledged these results.

## 2017-04-06 ENCOUNTER — Other Ambulatory Visit: Payer: Self-pay | Admitting: Family Medicine

## 2017-04-06 DIAGNOSIS — C561 Malignant neoplasm of right ovary: Secondary | ICD-10-CM

## 2017-04-06 MED FILL — XARELTO 20 MG TABLET: 20 | 30 days supply | Qty: 30 | Fill #3

## 2017-04-08 MED FILL — PANTOPRAZOLE SOD DR 40 MG T: 40 | 30 days supply | Qty: 30 | Fill #0

## 2017-05-05 MED FILL — PANTOPRAZOLE SOD DR 40 MG T: 40 | 30 days supply | Qty: 30 | Fill #1

## 2017-05-06 ENCOUNTER — Telehealth: Payer: Self-pay | Admitting: Licensed Clinical Social Worker

## 2017-05-06 NOTE — Telephone Encounter (Signed)
Samples of Xarelto 20 mg were left at front office for patient, quantity 28, Lot Number [id number], exp 06/2019. Danley Danker, RN [facility] RN)

## 2017-05-06 NOTE — Progress Notes (Signed)
Type of Service: Clinical Social Work   LCSW returned phone call from patient.  Patient reports unable to get her Rx refilled, it will cost her $300.00  also states her insurance premium has increased to $1,400 per month.  Tax preparer did not give her the [employer], which would have put her premium where it was last year.  Patient's daughter paid the premium last year.   The mistake happen with 2018 taxes.  Patient has no income and unable to afford the premium, her medication or pay to have the taxes revised.   Patient reports she will have to cancel the insurance.   The following was discussed: community options and information needed to apply for Medication Assistance program, [place], and [place] program to assist with free tax preparations.  Patient appreciative of information and assistance.  LCSW discussed sample options with pharmacy and RN.  Intervention: emotional support , Solution-Focused Strategies, Supportive Counseling and Link to [employer]: Patient will pickup Samples of Xarelto left at front desk by Burnis Kingfisher on Thursday 05/07/17. 1. Patient will contact [employer] to consult again for all options 2. Call [employer] to discuss all options ( hardship or waiver letter for premium)  3. Will prepare forms and information to apply for MAP and or Orange Card if unable to resolve insurance concerns. 4. Set up appointment with [employer] to have taxes prepared. 5. Patient will F/U with LCSW as needed.  [name], LCSW Licensed Clinical Social Worker [place]   [phone] 12:26 PM

## 2017-06-02 ENCOUNTER — Encounter: Payer: Self-pay | Admitting: Family Medicine

## 2017-06-02 ENCOUNTER — Other Ambulatory Visit: Payer: Self-pay

## 2017-06-02 ENCOUNTER — Ambulatory Visit: Payer: BLUE CROSS/BLUE SHIELD | Admitting: Family Medicine

## 2017-06-02 ENCOUNTER — Ambulatory Visit (INDEPENDENT_AMBULATORY_CARE_PROVIDER_SITE_OTHER): Payer: Self-pay | Admitting: Family Medicine

## 2017-06-02 VITALS — BP 136/68 | HR 72 | Temp 98.2°F | Ht 66.0 in | Wt 379.2 lb

## 2017-06-02 DIAGNOSIS — Z23 Encounter for immunization: Secondary | ICD-10-CM

## 2017-06-02 DIAGNOSIS — N183 Chronic kidney disease, stage 3 unspecified: Secondary | ICD-10-CM

## 2017-06-02 DIAGNOSIS — R599 Enlarged lymph nodes, unspecified: Secondary | ICD-10-CM

## 2017-06-02 DIAGNOSIS — I8290 Acute embolism and thrombosis of unspecified vein: Secondary | ICD-10-CM

## 2017-06-02 DIAGNOSIS — Z807 Family history of other malignant neoplasms of lymphoid, hematopoietic and related tissues: Secondary | ICD-10-CM

## 2017-06-02 DIAGNOSIS — F329 Major depressive disorder, single episode, unspecified: Secondary | ICD-10-CM

## 2017-06-02 DIAGNOSIS — C561 Malignant neoplasm of right ovary: Secondary | ICD-10-CM

## 2017-06-02 DIAGNOSIS — F32A Depression, unspecified: Secondary | ICD-10-CM

## 2017-06-02 NOTE — Patient Instructions (Signed)
It was good to see you again today.  We will contact you for the Xarelto.    The spot under your chin feels like a benign lymph node.  Let me know if it changes or starts to get bigger.  Come back and see me in 3 months so I know you're doing okay.  Touch base with Neoma Laming in the meantime.

## 2017-06-02 NOTE — Assessment & Plan Note (Signed)
Repeat bloodwork in about 3 months.

## 2017-06-02 NOTE — Assessment & Plan Note (Signed)
In remission.  Following with oncology.  Port still in place.

## 2017-06-02 NOTE — Progress Notes (Signed)
Subjective:    [name] is a 52 y.o. female who presents to [facility] today for knot on her chin:  1.  Knot on chin:  Felt a "small knot" under her chin.  Had some skin changes over the area over the past week or so, and noted the small bump after that.  Nonpainful.  Mobile.  No other bumps on her face or neck.  No oral lesions/cavities/etc   2.  Depression:  Lots of stressors at home, including chronically ill daughter. Also her cancer has made her more depressed. She had some success with [facility] here in past but hasn't followed up with this recently.  No SI/HI.  Getting out helps her symptoms.     3.  DVT hx:  Needs xarelto refills.  She has had some changes in her insurance and is looking to get this back.  Needs sample refill in meantime.     ROS as above per HPI.  Pertinently, no chest pain, palpitations, SOB, Fever, Chills, Abd pain, N/V/D.   The following portions of the patient's history were reviewed and updated as appropriate: allergies, current medications, past medical history, family and social history, and problem list. Patient is a nonsmoker.    PMH reviewed.  Past Medical History:  Diagnosis Date  . Anemia   . Cough   . Diverticulitis with perforation 2010  . DVT (deep vein thrombosis) in pregnancy ([place]) 11/24/2016  . DVT, lower extremity ([place])   . Endometrial cancer ([place])   . Family history of adverse reaction to anesthesia    pts daughter had difficulty awakening following anesthesia   . GERD (gastroesophageal reflux disease)   . Headache   . History of bronchitis   . History of ear infections   . Hx of migraines   . Hypertension   . IBS (irritable bowel syndrome)   . Kidney infection   . Morbid obesity ([place])   . Ovarian cancer ([place]) dx'd 01/2015  . Pyelonephritis    Past Surgical History:  Procedure Laterality Date  . ABDOMINAL HYSTERECTOMY N/A 03/13/2015   Procedure: TOTAL HYSTERECTOMY ABDOMINAL BILATERAL SALPINGO OOPHORECTOMY RADICAL TUMOR [place];   Surgeon: Everitt Amber, MD;  Location: [facility];  Service: Gynecology;  Laterality: N/A;  . BOWEL RESECTION  03/13/2015   Procedure: SMALL BOWEL RESECTION;  Surgeon: Everitt Amber, MD;  Location: [facility];  Service: Gynecology;;  . [place] and 1984  . COLOSTOMY  2008  . COLOSTOMY TAKEDOWN    . LAPAROTOMY N/A 03/13/2015   Procedure: EXPLORATORY LAPAROTOMY;  Surgeon: Everitt Amber, MD;  Location: [facility];  Service: Gynecology;  Laterality: N/A;  . TONSILLECTOMY AND ADENOIDECTOMY  1997  . VENTRAL HERNIA REPAIR  03/13/2015   Procedure: HERNIA REPAIR VENTRAL ADULT;  Surgeon: Everitt Amber, MD;  Location: [facility];  Service: Gynecology;;    Medications reviewed. Current Outpatient Medications  Medication Sig Dispense Refill  . acetaminophen (TYLENOL) 500 MG tablet Take 1,000 mg by mouth every 6 (six) hours as needed for mild pain, moderate pain, fever or headache. Reported on 09/10/2015    . cetirizine (ZYRTEC) 10 MG tablet Take 1 tablet (10 mg total) by mouth daily as needed for allergies (itching). 30 tablet 0  . gabapentin (NEURONTIN) 100 MG capsule Take 100 mg by mouth at bedtime as needed.     . Iron-Vitamins (GERITOL COMPLETE) TABS Take 1 tablet by mouth daily.    Marland Kitchen lidocaine-prilocaine (EMLA) cream Apply to PAC site 1-2 hours prior to access  as needed. 30 g 2  . Oral Electrolytes ([employer] ELECTROLYTE SOLUTION PO) Take 20-40 drops by mouth daily. Place 20-40 drops in liquid daily    . pantoprazole (PROTONIX) 40 MG tablet TAKE 1 TABLET BY MOUTH ONCE DAILY 30 tablet 2  . rivaroxaban (XARELTO) 20 MG TABS tablet Take 1 tablet (20 mg total) by mouth daily with supper. 30 tablet 6  . triamcinolone cream (KENALOG) 0.1 % APPLY TO THE AFFECTED AREA(S) TWICE DAILY AS NEEDED 30 g 1   No current facility-administered medications for this visit.    Facility-Administered Medications Ordered in Other Visits  Medication Dose Route Frequency Provider Last Rate Last Dose  . sodium chloride flush (NS) 0.9 % injection  10 mL  10 mL Intravenous PRN Livesay, Lennis P, MD   10 mL at 05/31/15 0300  . sodium chloride flush (NS) 0.9 % injection 10 mL  10 mL Intravenous PRN Livesay, Lennis P, MD   10 mL at 08/02/15 1442     Objective:   Physical Exam BP 136/68   Pulse 72   Temp 98.2 F (36.8 C) (Oral)   Ht 5\' 6"  (1.676 m)   Wt (!) 379 lb 3.2 oz (172 kg)   LMP 10/26/2013   SpO2 99%   BMI 61.20 kg/m  Gen:  Alert, cooperative patient who appears stated age in no acute distress.  Vital signs reviewed. HEENT: EOMI,  MMM.  0.5 cm mobile nodule under her chin that appears to be consistent with lymph nodule.  No redness, non painful.  She has small patch of hyperpigmentation that appears to be resolved/resolving cellulitis overlying the nodule.   Neck:  No other LAD noted.   Cardiac:  Regular rate and rhythm  Pulm:  Clear to auscultation bilaterally  Abd:  Soft/nondistended/nontender.  Good bowel sounds throughout all four quadrants.  No masses noted.  Exts: Non edematous BL  LE, warm and well perfused.   Imp/Plan: 1. Swollen lymph node: - feels benign.  Freely mobile, rubbery, not hard.  Directly under area of prior cellulitis makes reactive lymph node more likely.  - daughter with history of lymphoma.  - her WBCs have always been normal - no other LAD noted - Wait and see if there are any other lymph nodes that appear. If so will refer back to oncology for further w/u  2.  Depression: - declined medications today \ - contact [place]  3.  Hx/o DVT: - needs xarelto refill - will obtain samples for her while she sorts insurance  4.  CKD Stage III: - noted on last creatinine.  SCr has been stable. - will recheck next visit.  Needs to establish with nephrology in next few months.

## 2017-06-03 ENCOUNTER — Other Ambulatory Visit: Payer: Self-pay | Admitting: Family Medicine

## 2017-06-03 NOTE — Telephone Encounter (Signed)
Patient was told she could get a free 28 day supply of xarelto from Dr. Mingo Amber.  Please let her know if/when this is ready.  Best number to reach pt is [phone], thanks

## 2017-06-04 ENCOUNTER — Encounter: Payer: Self-pay | Admitting: Family Medicine

## 2017-06-04 NOTE — Telephone Encounter (Signed)
Samples up front for pick up. Pt informed. Ottis Stain, CMA

## 2017-06-04 NOTE — Telephone Encounter (Signed)
Please provide her with a 28 day sample of xarelto from the sample closet.  Thanks!  JW

## 2017-06-05 MED FILL — PANTOPRAZOLE SOD DR 40 MG T: 40 | 30 days supply | Qty: 30 | Fill #2

## 2017-06-11 ENCOUNTER — Ambulatory Visit: Payer: Medicaid Other | Admitting: Licensed Clinical Social Worker

## 2017-06-11 DIAGNOSIS — Z658 Other specified problems related to psychosocial circumstances: Secondary | ICD-10-CM

## 2017-06-11 NOTE — Progress Notes (Signed)
Type of Service: Integrated Behavioral Health Total time:35 minutes :  Interpreter:No.    Reason for follow-up: Continue brief intervention to assist patient with managing stressors .  Patient is pleasant and engaged in conversation .  Appearance:Well Groomed ; Thought process: Coherent; Affect: Appropriate   Goals: Patient will reduce symptoms of: stress , and increase  ability RF:XJOITG skills and self-management skills, Increase healthy adjustment to current life circumstances. Intervention: Solution-Focused Strategies and Supportive Counseling, Reflective listening,, Problem-solving teaching/coping strategies and community resources.  Issues discussed: financial stressors,  Managing Health concerns ; disability barriers and Concerns with daughter; LCSW provided [facility] card application and reviewed process.  Assessment:Patient continues to experience stress.  Patient may benefit from, and is in agreement to continue further assessment and brief therapeutic interventions to assist with managing stress.  Plan: 1. Patient will F/U with LCSW as needed by phone or in person 2.  Referral:  Patient will complete Orange card application, and attach all documentation  [name], LCSW Licensed Clinical Social Worker [place]   [phone] 10:52 AM

## 2017-06-16 ENCOUNTER — Telehealth: Payer: Self-pay | Admitting: *Deleted

## 2017-06-16 NOTE — Telephone Encounter (Signed)
Attempted to return the patient's call, no answer. Left a message that her appt for tomorrow is cancelled per her request. Left message asking her to call the office back to reschedule.

## 2017-06-17 ENCOUNTER — Ambulatory Visit: Payer: BLUE CROSS/BLUE SHIELD | Admitting: Gynecologic Oncology

## 2017-07-02 ENCOUNTER — Telehealth: Payer: Self-pay

## 2017-07-02 MED ORDER — RIVAROXABAN 20 MG PO TABS: 20.0000 mg | ORAL_TABLET | Freq: Every day | ORAL | 6 refills | Status: DC

## 2017-07-02 NOTE — Telephone Encounter (Signed)
Spoke with pt.  Will place 4 bottles of 20mg 's @ the front desk for pickup. Chaske Paskett, Salome Spotted, CMA

## 2017-07-02 NOTE — Telephone Encounter (Signed)
Yes please give 30 days sample.  This should cover for April.  We'll figure out what to do next pending approval.  Thanks, JW

## 2017-07-02 NOTE — Telephone Encounter (Signed)
Patient left message that she needs a 28 day supply of Xarelto because she has not been able to meet with Kennyth Lose yet for financial assistance application.  Spoke with Kennyth Lose and patient has appt with her on 07/08/17 but no guarantee she will have all her paperwork or be approved.   Is it ok to give samples if available and how many if so? Danley Danker, RN [facility] RN)

## 2017-07-07 ENCOUNTER — Other Ambulatory Visit: Payer: Self-pay | Admitting: Family Medicine

## 2017-07-07 DIAGNOSIS — C561 Malignant neoplasm of right ovary: Secondary | ICD-10-CM

## 2017-07-07 MED FILL — PANTOPRAZOLE SOD DR 40 MG T: 40 | 90 days supply | Qty: 90 | Fill #0

## 2017-07-08 ENCOUNTER — Ambulatory Visit: Payer: Self-pay

## 2017-08-03 ENCOUNTER — Other Ambulatory Visit: Payer: Self-pay

## 2017-08-03 NOTE — Telephone Encounter (Signed)
Contacted pt and she would like the refill to go to [facility] and she is coming in on Thursday 5/2 for her knees.  She said she could hardly walk and wanted to come in earlier if possible.  Katharina Caper, April D, [place]

## 2017-08-03 NOTE — Telephone Encounter (Signed)
Yes we can do cortisone injections here.  Also, I tried to refill her xarelto but she has multiple pharmacies listed.  Can you call and ask?  Thanks!

## 2017-08-03 NOTE — Telephone Encounter (Signed)
Patient left message that she needs a refill on Xarelto for 30 days.  Also wants to know if she can get cortisone injections in her knees here, stated ortho does not accept orange card.  Call back is [phone].  Danley Danker, RN [facility] RN)

## 2017-08-04 MED ORDER — RIVAROXABAN 20 MG PO TABS: 20.0000 mg | ORAL_TABLET | Freq: Every day | ORAL | 6 refills | Status: DC

## 2017-08-04 NOTE — Telephone Encounter (Signed)
Pt left message on nurse line checking status of refill request.  Wallace Cullens, RN

## 2017-08-05 NOTE — Telephone Encounter (Signed)
Pt called nurse line, states she is still waiting on her orange card, and currently her xarelto is $500. Wanting to know if there is any other assistance we can provide her in the meantime. Will forward to MD and Dr. Valentina Lucks for review. Wallace Cullens, RN

## 2017-08-06 ENCOUNTER — Ambulatory Visit (INDEPENDENT_AMBULATORY_CARE_PROVIDER_SITE_OTHER): Payer: Self-pay | Admitting: Student

## 2017-08-06 ENCOUNTER — Other Ambulatory Visit: Payer: Self-pay

## 2017-08-06 ENCOUNTER — Encounter: Payer: Self-pay | Admitting: Student

## 2017-08-06 VITALS — BP 132/84 | HR 66 | Temp 98.0°F | Wt 378.0 lb

## 2017-08-06 DIAGNOSIS — M25562 Pain in left knee: Secondary | ICD-10-CM

## 2017-08-06 DIAGNOSIS — G8929 Other chronic pain: Secondary | ICD-10-CM

## 2017-08-06 DIAGNOSIS — M25561 Pain in right knee: Secondary | ICD-10-CM

## 2017-08-06 NOTE — Progress Notes (Signed)
Subjective:    [name] is a 52 y.o. old female here for bilateral knee pain.  HPI Bilateral knee pain: chronic issue for the last 5 to 6 months.  She has history of osteoarthritis.  She reports history of meniscal tear in her right knee about 6 months ago.  She says she used to get cortisone injection at [facility].  She recently lost insurance and could not go back.  She is currently applying falling, hip.  Last steroid injection about 5 months ago.  Pain is mainly over the anterior aspect of her knees bilaterally.  She describes the pain as aching and sometimes sharp.  Pain worse with standing and ambulation.  She denies fever, chills.  She reports intermittent swelling.  She denies any skin erythema. Treatment so far include tramadol and cortisone injection. Patient is on Xarelto for PE.  PMH/Problem List: has Right knee pain; Seasonal allergies; Left knee pain; Stress at home; Essential hypertension, benign; Morbid obesity with BMI of 60.0-69.9, adult ([place]); Ventral hernia without obstruction or gangrene; Ovarian carcinosarcoma, right ([place]); Endometrial ca [facility]); Iron deficiency anemia due to chronic blood loss; CKD (chronic kidney disease) stage 3, GFR 30-59 ml/min ([place]); Esophageal reflux; Chemotherapy-induced peripheral neuropathy ([place]); Slow rate of speech; Long term current use of anticoagulant therapy; Portacath in place; Genetic testing; Eczema; Elevated antinuclear antibody (ANA) level; Fatigue; Acute pulmonary embolism (HCC); DVT (deep vein thrombosis) in pregnancy ([place]); and Cramping of hands on their problem list.   has a past medical history of Anemia, Cough, Diverticulitis with perforation (2010), DVT (deep vein thrombosis) in pregnancy ([place]) (11/24/2016), DVT, lower extremity ([place]), Endometrial cancer ([place]), Family history of adverse reaction to anesthesia, GERD (gastroesophageal reflux disease), Headache, History of bronchitis, History of ear infections, migraines,  Hypertension, IBS (irritable bowel syndrome), Kidney infection, Morbid obesity ([place]), Ovarian cancer ([place]) (dx'd 01/2015), and Pyelonephritis.  FH:  Family History  Problem Relation Age of Onset  . Diabetes Mother   . Hypertension Mother   . Hyperlipidemia Mother   . Colon polyps Mother        4 total  . Fibroids Mother        s/p hysterectomy  . Hyperlipidemia Father   . Diabetes Daughter   . Hodgkin's lymphoma Daughter 20  . Asthma Maternal Aunt        severe - d. 53  . Prostate cancer Maternal Uncle 47  . Diabetes Paternal Aunt   . Diabetes Paternal Uncle   . Kidney failure Maternal Grandmother 84  . Pancreatic cancer Paternal Grandmother        dx. >50  . Diabetes Paternal Grandfather   . Diabetes Paternal Aunt   . Pancreatic cancer Cousin 45       paternal 1st cousin  . Heart disease Neg Hx   . Stroke Neg Hx     SH Social History   Tobacco Use  . Smoking status: Never Smoker  . Smokeless tobacco: Never Used  Substance Use Topics  . Alcohol use: Yes    Alcohol/week: 0.0 oz    Comment: Maybe wine every 6 months  . Drug use: No    Review of Systems Review of systems negative except for pertinent positives and negatives in history of present illness above.     Objective:     Vitals:   08/06/17 1054  BP: 132/84  Pulse: 66  Temp: 98 F (36.7 C)  TempSrc: Oral  SpO2: 99%  Weight: (!) 378 lb (171.5 kg)   Body  mass index is 61.01 kg/m.  Physical Exam  GEN: appears well & comfortable. No apparent distress. CVS: RRR, nl s1 & s2, no murmurs, no edema RESP: no IWOB MSK:   Left and right knees: limited exam due to body habitus.  Inspection without erythema, obvious effusion or overlying skin color change.  Palpation with joint line tenderness laterally in both knees but very hard to palpate the joint lines clearly.  No increased warmth to touch.  Grossly full range of motion.  Ligaments with solid consistent endpoints.  No antalgic gait.  Neurovascularily  intact.  SKIN: no apparent skin lesion NEURO: alert and oiented appropriately, no gross deficits   PSYCH: euthymic mood with congruent affect    Assessment and Plan:  1. Chronic pain of both knees: likely due to osteoarthritis.  Exam without overlying skin erythema or significant swelling.  She has joint line tenderness laterally in both knees suggestive for osteoarthritis.  Low suspicion for septic joint.  Discussed about the treatment options including steroid injection and oral pain medication such as scheduled Tylenol. Given body habitus, difficulty localizing the joint line clearly and history of PE on Xarelto, there is a chance that we may have to attempt multiple times to deliver cortisone to the right side and relatively increased risk of bleeding as a result.  After considering the risks and benefits, patient elected to try Tylenol 1000 mg 3 times daily until she knows the status of her orange card application.   Return if symptoms worsen or fail to improve.  Mercy Riding, MD 08/06/17 Pager: [phone]

## 2017-08-06 NOTE — Telephone Encounter (Signed)
We should get her set up with MAP, which to my understanding, is slightly different from the [facility].  In the meantime, can we provide her with another month's sample?

## 2017-08-06 NOTE — Patient Instructions (Signed)
It was great seeing you today! We have addressed the following issues today  Knee pain: you may try Tylenol 1000 mg 3 times a day.   If we did any lab work today, and the results require attention, either me or my nurse will get in touch with you. If everything is normal, you will get a letter in mail and a message via . If you don't hear from [place] in two weeks, please give [place] a call. Otherwise, we look forward to seeing you again at your next visit. If you have any questions or concerns before then, please call the clinic at [phone].  Please bring all your medications to every doctors visit  Sign up for My Chart to have easy access to your labs results, and communication with your Primary care physician.    Please check-out at the front desk before leaving the clinic.    Take Care,   Dr. Cyndia Skeeters

## 2017-08-07 MED ORDER — RIVAROXABAN 20 MG PO TABS: 20.0000 mg | ORAL_TABLET | Freq: Every day | ORAL | 0 refills | Status: DC

## 2017-08-07 NOTE — Telephone Encounter (Signed)
Patient called to ask about status of Xarelto request question. Called patient back and got an identified voicemail. Told her that I have placed samples at the front desk for her along with information on the medication assistance program for help with this medication going forward. Asked her to call back with questions. Danley Danker, RN [facility] RN)

## 2017-08-07 NOTE — Addendum Note (Signed)
Addended by: Esau Grew on: 08/07/2017 04:06 PM   Modules accepted: Orders

## 2017-08-11 ENCOUNTER — Ambulatory Visit (INDEPENDENT_AMBULATORY_CARE_PROVIDER_SITE_OTHER): Payer: Self-pay | Admitting: Family Medicine

## 2017-08-11 ENCOUNTER — Other Ambulatory Visit: Payer: Self-pay

## 2017-08-11 ENCOUNTER — Encounter: Payer: Self-pay | Admitting: Family Medicine

## 2017-08-11 VITALS — BP 122/78 | HR 61 | Temp 97.7°F | Ht 66.0 in | Wt 378.2 lb

## 2017-08-11 DIAGNOSIS — M25561 Pain in right knee: Secondary | ICD-10-CM

## 2017-08-11 DIAGNOSIS — R4789 Other speech disturbances: Secondary | ICD-10-CM

## 2017-08-11 DIAGNOSIS — F439 Reaction to severe stress, unspecified: Secondary | ICD-10-CM

## 2017-08-11 DIAGNOSIS — G8929 Other chronic pain: Secondary | ICD-10-CM

## 2017-08-11 DIAGNOSIS — Z6841 Body Mass Index (BMI) 40.0 and over, adult: Secondary | ICD-10-CM

## 2017-08-11 DIAGNOSIS — N183 Chronic kidney disease, stage 3 unspecified: Secondary | ICD-10-CM

## 2017-08-11 DIAGNOSIS — R599 Enlarged lymph nodes, unspecified: Secondary | ICD-10-CM

## 2017-08-11 MED ORDER — FUROSEMIDE 20 MG PO TABS: ORAL_TABLET | ORAL | 0 refills | Status: DC

## 2017-08-11 NOTE — Progress Notes (Signed)
Subjective:    [name] is a 52 y.o. female who presents to [facility] today for pain and Knot under chin:  1.  Knot under chin: Present now for several weeks.  Nontender.  Feels it may have grown a little bit.  No other lymph nodes noted.  She has not had any recent illnesses.  No seasonal allergies.  No fevers or chills.  No recent dental infections  2.  Chronic pain: She has pain in most joints but worse in her feet and knees.  She has been told by rheumatology that reportedly this is secondary to history of lupus.  She is appointment to see them on May 30.  She has been attempting weight loss but was recently kicked out of her house and therefore has not been holding to her diet.  Says that she has been very stressed recently.  She is managing with over-the-counter pain medications.  3.  Foot swelling: She has noted increased foot swelling bilaterally.  No trouble breathing.  Sounds like this happened around the time she left her house in her diet worsens.  Sometimes her ankles and I am stressed to her feet.  She has occasional pain because swelling even without pain.  No redness.  No warmth.  No syncope or palpitations.  No cough.   ROS as above per HPI.    The following portions of the patient's history were reviewed and updated as appropriate: allergies, current medications, past medical history, family and social history, and problem list. Patient is a nonsmoker.    PMH reviewed.  Past Medical History:  Diagnosis Date  . Anemia   . Cough   . Diverticulitis with perforation 2010  . DVT (deep vein thrombosis) in pregnancy ([place]) 11/24/2016  . DVT, lower extremity ([place])   . Endometrial cancer ([place])   . Family history of adverse reaction to anesthesia    pts daughter had difficulty awakening following anesthesia   . GERD (gastroesophageal reflux disease)   . Headache   . History of bronchitis   . History of ear infections   . Hx of migraines   . Hypertension   . IBS (irritable  bowel syndrome)   . Kidney infection   . Morbid obesity ([place])   . Ovarian cancer ([place]) dx'd 01/2015  . Pyelonephritis    Past Surgical History:  Procedure Laterality Date  . ABDOMINAL HYSTERECTOMY N/A 03/13/2015   Procedure: TOTAL HYSTERECTOMY ABDOMINAL BILATERAL SALPINGO OOPHORECTOMY RADICAL TUMOR [place] ;  Surgeon: Everitt Amber, MD;  Location[facility];  Service: Gynecology;  Laterality: N/A;  . BOWEL RESECTION  03/13/2015   Procedure: SMALL BOWEL RESECTION;  Surgeon: Everitt Amber, MD;  Location: [facility];  Service: Gynecology;;  . [place] and 1984  . COLOSTOMY  2008  . COLOSTOMY TAKEDOWN    . LAPAROTOMY N/A 03/13/2015   Procedure: EXPLORATORY LAPAROTOMY;  Surgeon: Everitt Amber, MD;  [facility];  Service: Gynecology;  Laterality: N/A;  . TONSILLECTOMY AND ADENOIDECTOMY  1997  . VENTRAL HERNIA REPAIR  03/13/2015   Procedure: HERNIA REPAIR VENTRAL ADULT;  Surgeon: Everitt Amber, MD;  [facility];  Service: Gynecology;;    Medications reviewed. Current Outpatient Medications  Medication Sig Dispense Refill  . acetaminophen (TYLENOL) 500 MG tablet Take 1,000 mg by mouth every 6 (six) hours as needed for mild pain, moderate pain, fever or headache. Reported on 09/10/2015    . cetirizine (ZYRTEC) 10 MG tablet Take 1 tablet (10 mg total) by mouth  daily as needed for allergies (itching). 30 tablet 0  . gabapentin (NEURONTIN) 100 MG capsule Take 100 mg by mouth at bedtime as needed.     . Iron-Vitamins (GERITOL COMPLETE) TABS Take 1 tablet by mouth daily.    Marland Kitchen lidocaine-prilocaine (EMLA) cream Apply to PAC site 1-2 hours prior to access as needed. 30 g 2  . Oral Electrolytes ([employer] ELECTROLYTE SOLUTION PO) Take 20-40 drops by mouth daily. Place 20-40 drops in liquid daily    . pantoprazole (PROTONIX) 40 MG tablet TAKE 1 TABLET BY MOUTH ONCE DAILY 30 tablet 2  . rivaroxaban (XARELTO) 20 MG TABS tablet Take 1 tablet (20 mg total) by mouth daily with supper. 28 tablet 0  . triamcinolone  cream (KENALOG) 0.1 % APPLY TO THE AFFECTED AREA(S) TWICE DAILY AS NEEDED 30 g 1   No current facility-administered medications for this visit.    Facility-Administered Medications Ordered in Other Visits  Medication Dose Route Frequency Provider Last Rate Last Dose  . sodium chloride flush (NS) 0.9 % injection 10 mL  10 mL Intravenous PRN Livesay, Lennis P, MD   10 mL at 05/31/15 0300  . sodium chloride flush (NS) 0.9 % injection 10 mL  10 mL Intravenous PRN Livesay, Lennis P, MD   10 mL at 08/02/15 1442     Objective:   Physical Exam BP 122/78   Pulse 61   Temp 97.7 F (36.5 C) (Oral)   Ht 5\' 6"  (1.676 m)   Wt (!) 378 lb 3.2 oz (171.6 kg)   LMP 10/26/2013   SpO2 99%   BMI 61.04 kg/m  Gen:  Alert, cooperative patient who appears stated age in no acute distress.  Vital signs reviewed. HEENT: EOMI,  MMM.  With 1 x 1 cm rubbery, nontender, non-erythematous mass consistent with enlarged lymph node distal inferior mandible.  Mobile and well-encapsulated.  Neck:  No other LAD noted.    Cardiac:  Regular rate and rhythm  Pulm:  Clear to auscultation bilaterally Abd:  Soft/nondistended/nontender.   MSK: Some tenderness bilaterally of her knees to the joint line palpation. Extremities: +2 edema bilateral ankles.  Trace edema bilateral shins.  No redness or warmth.  Sensation is intact. Neuro: Loss of left nasolabial fold is chronic.  She also has some mild left facial droop.  No results found for this or any previous visit (from the past 72 hour(s)).

## 2017-08-11 NOTE — Patient Instructions (Signed)
It was good to see you again today.   Take the lasix 20 mg one pill a day for the next week.  After that, you can move to as needed for your leg swelling.    Talk to Kennyth Lose about [employer].  Once this goes through, we'll get a knee injection for your, check your labs, etc.   I do believe the knot under your chin is a lymph node.  But this doesn't necessarily mean anything bad.  It will likely stay swollen, it doesn't fill hard or "bad."  We'll keep an eye on it.    Follow up with Rheumatology this month.    Come back and see me in 4 - 6 weeks.

## 2017-08-12 ENCOUNTER — Ambulatory Visit: Payer: Self-pay | Admitting: Licensed Clinical Social Worker

## 2017-08-12 DIAGNOSIS — Z658 Other specified problems related to psychosocial circumstances: Secondary | ICD-10-CM

## 2017-08-12 NOTE — Progress Notes (Signed)
Type of Service: [place] F/U Visit Total time:30 minutes :  Interpreter:No.    Reason for follow-up: Continue brief intervention to assist patient with managing stressors . Patient is pleasant and engaged in conversation.   Appearance:Well Groomed ; Thought process: Coherent; Affect: Appropriate and sad at times: No plan to harm self or others   GAD 7 : Generalized Anxiety Score 08/12/2017 10/25/2015  Nervous, Anxious, on Edge 0 0  Control/stop worrying 0 0  Worry too much - different things 2 1  Trouble relaxing 2 0  Restless 0 0  Easily annoyed or irritable 0 0  Afraid - awful might happen 0 0  Total GAD 7 Score 4 1  Anxiety Difficulty Not difficult at all Not difficult at all   Depression screen [facility] 2/9 08/12/2017 08/06/2017 06/02/2017  Decreased Interest 0 0 0  Down, Depressed, Hopeless 2 0 0  PHQ - 2 Score 2 0 0  Altered sleeping 2 - -  Tired, decreased energy 2 - -  Change in appetite 0 - -  Feeling bad or failure about yourself  2 - -  Trouble concentrating 0 - -  Moving slowly or fidgety/restless 0 - -  Suicidal thoughts 0 - -  PHQ-9 Score 8 - -  Difficult doing work/chores Not difficult at all - -  Some recent data might be hidden   Goals: Patient will reduce symptoms of: stress , and increase  ability ZY:SAYTKZ skills and stress reduction, Increase healthy adjustment to current life circumstances. Intervention: Solution-Focused Strategies and Supportive Counseling, Reflective listening and [employer]  Issues discussed: current stressors  ; how to manage ;  [employer]; concerns about heatlh ; [employer]; . Assessment:Patient is experiencing symptoms of stress and mild depression/anxiety.  Symptoms exacerbated by recent change in living situation and not having health insurance to meet medical needs.  Plan: 1. Patient will F/U with LCSW as needed 2. Behavioral recommendations: continue relaxation tech 3. Referral: [employer]:   Housing, and [place], LCSW Licensed Clinical Social Worker [place]   [phone] 12:18 PM

## 2017-08-13 ENCOUNTER — Encounter: Payer: Self-pay | Admitting: Family Medicine

## 2017-08-13 DIAGNOSIS — R599 Enlarged lymph nodes, unspecified: Secondary | ICD-10-CM

## 2017-08-13 HISTORY — DX: Enlarged lymph nodes, unspecified: R59.9

## 2017-08-13 NOTE — Assessment & Plan Note (Signed)
Desires corticosteroid injection.  We will do this next visit pending insurance status.

## 2017-08-13 NOTE — Assessment & Plan Note (Signed)
Diet worsen again secondary to stressors at home.

## 2017-08-13 NOTE — Assessment & Plan Note (Signed)
This has grown somewhat since last visit. Likely enlarged lymph node.  Does have history of cancer.  If continues to grow will check CBC next visit +/- ultrasound.

## 2017-08-13 NOTE — Assessment & Plan Note (Signed)
Has some lower extremity edema. -Patient deferred checking labs today secondary to finances.  She is to follow-up with me we can check labs at that appointment.  Hopeful at that point she will have some insurance.  Her breathing deteriorates or her leg swelling worsens plan will be to obtain labs to check kidney function and BNP regardless of insurance status. -Started low-dose Lasix today mostly for comfort.  Most likely this is chronic venous insufficiency plus or minus salt intake from poor diet

## 2017-08-13 NOTE — Assessment & Plan Note (Signed)
Ongoing.  She has recently moved in with her parents because she has no else to live

## 2017-08-19 ENCOUNTER — Telehealth: Payer: Self-pay | Admitting: Licensed Clinical Social Worker

## 2017-08-19 NOTE — Progress Notes (Signed)
Type of Service: Clinical Social Work  LCSW received phone call from patient, states she has applied for the MAP program and will receive assistance with her medication.   [place] application continues to pend and not moving forward.  Patient will continue to work on this progess.  [name], LCSW Licensed Clinical Social Worker [place]   [phone] 9:32 AM

## 2017-08-21 ENCOUNTER — Telehealth: Payer: Self-pay

## 2017-08-21 MED FILL — FUROSEMIDE 20 MG TABS: 20 | 30 days supply | Qty: 30 | Fill #0

## 2017-08-21 NOTE — Telephone Encounter (Signed)
Pt called nurse line, states Dr. Mingo Amber was sending in Rx for diuretic, and [facility] outpatient didn't have it. Rx was sent and confirmed by pharmacy 08/11/17. Called to verify and pharmacy does have rx and will fill for pt. Pt aware.  Wallace Cullens, RN

## 2017-09-02 ENCOUNTER — Telehealth: Payer: Self-pay | Admitting: *Deleted

## 2017-09-02 ENCOUNTER — Telehealth: Payer: Self-pay | Admitting: Licensed Clinical Social Worker

## 2017-09-02 NOTE — Telephone Encounter (Signed)
Returned the patient's call and scheduled the follow up appt

## 2017-09-02 NOTE — Progress Notes (Signed)
Type of Service: Clinical Social Work  LCSW returned phone call from patient, states needs to talk with PCP.  LCSW will send provider a message.   Patient appreciative of assistance.     [name], LCSW Licensed Clinical Social Worker [place]   [phone] 9:01 AM

## 2017-09-04 MED FILL — XARELTO 20 MG TABLET: 20 | 30 days supply | Qty: 30 | Fill #0

## 2017-09-09 NOTE — Telephone Encounter (Signed)
Patient returning call to PCP. Call back number is [phone].  Danley Danker, RN [facility] RN)

## 2017-09-09 NOTE — Telephone Encounter (Signed)
Attempted to call patient back but had to leave message.

## 2017-09-10 ENCOUNTER — Inpatient Hospital Stay: Payer: Self-pay | Attending: Gynecologic Oncology | Admitting: Gynecologic Oncology

## 2017-09-10 ENCOUNTER — Encounter: Payer: Self-pay | Admitting: Gynecologic Oncology

## 2017-09-10 VITALS — BP 129/80 | HR 70 | Temp 98.2°F | Resp 20 | Ht 65.5 in | Wt 373.0 lb

## 2017-09-10 DIAGNOSIS — Z90722 Acquired absence of ovaries, bilateral: Secondary | ICD-10-CM | POA: Insufficient documentation

## 2017-09-10 DIAGNOSIS — Z6841 Body Mass Index (BMI) 40.0 and over, adult: Secondary | ICD-10-CM | POA: Insufficient documentation

## 2017-09-10 DIAGNOSIS — Z90710 Acquired absence of both cervix and uterus: Secondary | ICD-10-CM | POA: Insufficient documentation

## 2017-09-10 DIAGNOSIS — C541 Malignant neoplasm of endometrium: Secondary | ICD-10-CM | POA: Insufficient documentation

## 2017-09-10 DIAGNOSIS — C569 Malignant neoplasm of unspecified ovary: Secondary | ICD-10-CM | POA: Insufficient documentation

## 2017-09-10 NOTE — Progress Notes (Signed)
Followup of Consult: Gyn-Onc  CC:  Chief Complaint  Patient presents with  . Ovarian carcinosarcoma, unspecified laterality ([place])  . Endometrial ca [facility])    Assessment/Plan:  [name]  is a 52 y.o.  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  She is s/p exploratory laparotomy and TAH, BSO, optimal cytoreduction to no residual visible disease on 03/13/15. S/p 6 cycles of adjuvant carboplatin and paclitaxel chemotherapy completed 08/02/15. Genetic testing negative.  Hx of PE in May, 2017, with recurrent DVT and PE in October, 2018 - on Lovenox.  Complete response to treatment. No evidence of recurrence.  CA 125 normal in December, 2018   Given that her tumor marker (CA 125) was never elevated, will check CT scan in December, 2019 when hopefully her insurance is reinstated. Patient declined having scan or tumor marker drawn sooner as she does not wish to incur the cost while she is feeling well.   HPI: [name] is a very pleasant 52 year old woman with history of 2 prior cesarean sections who is seen initially in the hospital as an inpatient consultation at [facility] on 02/19/2015 for complex pelvic masses. The patient is a morbidly obese woman with a BMI 62 kg/m. She was admitted to [facility] on 02/18/2015 with pyelonephritis. CT imaging and then MRI imaging was performed to evaluate for an etiology for pyelonephritis. This identified A large pelvic mass which it to be arising from the right adnexa. It measured 13 x 9 x 17.5 cm and was irregular cystic and solid in nature. A second, but cystic and solid lesion within the left adnexa measuring 4.4 x 6.5 x 5.3 cm. In the third cystic and solid mass was seen anterior to the uterus and superior to the bladder measuring 4.2 x 5.4 x 5.3 cm. She had no pelvic lymphadenopathy, no carcinomatosis, and CA 125 was normal at 21U/mL.  Her infection was adequately treated with  IV antibiotics and her acute kidney injury which manifested with an elevated creatinine 12.4 spontaneously resolved during her hospital stay with avoidance of NSAID and good hydration. She was noted to be anemic during her hospitalization with a hemoglobin of 10.8. However this did not require transfusion and spontaneously improved over time.   On 03/13/2015 she underwent an exploratory laparotomy TAH/BSO and radical tumor debulking for a stage IIIc carcinosarcoma of the right ovary and an incidentally found synchronous stage I a endometrial cancer. Tumor was found with a large right ovarian mass, smaller left ovarian mass and a carcinosarcoma implant was involving the anterior cul-de-sac between the bladder and lower uterine segment. All disease sites were completely resected with no macroscopic disease [place] at the completion of debulking.   She went on to receive 6 cycles of adjuvant chemotherapy with carboplatin and paclitaxel between 04/12/15 and 08/02/15. She tolerated treatment well with some neutropenia. CA 125 was normal at 4.1 at completion of therapy in March, 2017. On 08/23/15 she underwent post-treatment CT imaging which showed: there is a 2.5 x 1.6 cm soft tissue nodule near the vaginal cuff. Within the right hemipelvis there is a 1.8 cm soft tissue nodule.   Repeat CT on 11/21/15 showed that the small bilateral deep pelvic side wall soft tissue masses have decreased in size and are less suggestive of persistent/recurrent disease.  She has a stable small left ventral hernia.    CA 125 in October, 2017 normal.  CT abdo/pelvis in May, 2018 showed no evidence  of recurrent disease and CA 125 in May, 2018 was normal at 3.4.  In September, 2018 was normal at 3.  In October 2018 she was hospitalized with a left lower extremity DVT and PE. She was started on Lovenox. CT chest showed no lesions consistent with metastatic cancer.  CA 125 in December, 2018 normal at 3.   Interval History:  She  was diagnosed with Lupus and began treatment (though does not require ongoing meds), but lost her insurance in the spring of 2019 and has not returned to her provider since then. As a result, she missed her [facility].    Current Meds:  Outpatient Encounter Medications as of 09/10/2017  Medication Sig  . acetaminophen (TYLENOL) 500 MG tablet Take 1,000 mg by mouth every 6 (six) hours as needed for mild pain, moderate pain, fever or headache. Reported on 09/10/2015  . cetirizine (ZYRTEC) 10 MG tablet Take 1 tablet (10 mg total) by mouth daily as needed for allergies (itching).  . furosemide (LASIX) 20 MG tablet Take 1 tab by mouth daily x 1 week, then PRN daily after that for leg swelling  . gabapentin (NEURONTIN) 100 MG capsule Take 100 mg by mouth at bedtime as needed.   . Iron-Vitamins (GERITOL COMPLETE) TABS Take 1 tablet by mouth daily.  Marland Kitchen lidocaine-prilocaine (EMLA) cream Apply to PAC site 1-2 hours prior to access as needed.  . Oral Electrolytes (CVS ELECTROLYTE SOLUTION PO) Take 20-40 drops by mouth daily. Place 20-40 drops in liquid daily  . pantoprazole (PROTONIX) 40 MG tablet TAKE 1 TABLET BY MOUTH ONCE DAILY  . rivaroxaban (XARELTO) 20 MG TABS tablet Take 1 tablet (20 mg total) by mouth daily with supper.  . triamcinolone cream (KENALOG) 0.1 % APPLY TO THE AFFECTED AREA(S) TWICE DAILY AS NEEDED   Facility-Administered Encounter Medications as of 09/10/2017  Medication  . sodium chloride flush (NS) 0.9 % injection 10 mL  . sodium chloride flush (NS) 0.9 % injection 10 mL    Allergy:  Allergies  Allergen Reactions  . Penicillins Rash    Has patient had a PCN reaction causing immediate rash, facial/tongue/throat swelling, SOB or lightheadedness with hypotension: no Has patient had a PCN reaction causing severe rash involving mucus membranes or skin necrosis: no Has patient had a PCN reaction that required hospitalization in the hospital at the  time Has patient had a PCN reaction occurring within the last 10 years: no If all of the above answers are "NO", then may proceed with Cephalosporin use.     Social Hx:   Social History   Socioeconomic History  . Marital status: Legally Separated    Spouse name: Not on file  . Number of children: Not on file  . Years of education: Not on file  . Highest education level: Not on file  Occupational History  . Not on file  Social Needs  . Financial resource strain: Not on file  . Food insecurity:    Worry: Not on file    Inability: Not on file  . Transportation needs:    Medical: Not on file    Non-medical: Not on file  Tobacco Use  . Smoking status: Never Smoker  . Smokeless tobacco: Never Used  Substance and Sexual Activity  . Alcohol use: Yes    Alcohol/week: 0.0 oz    Comment: Maybe wine every 6 months  . Drug use: No  . Sexual activity: Yes    Comment: Told she  could not have more children in [place]  . Physical activity:    Days per week: Not on file    Minutes per session: Not on file  . Stress: Not on file  Relationships  . Social connections:    Talks on phone: Not on file    Gets together: Not on file    Attends religious service: Not on file    Active member of club or organization: Not on file    Attends meetings of clubs or organizations: Not on file    Relationship status: Not on file  . Intimate partner violence:    Fear of current or ex partner: Not on file    Emotionally abused: Not on file    Physically abused: Not on file    Forced sexual activity: Not on file  Other Topics Concern  . 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   Married x 23 years, recently separated (4/15).  No domestic violence.  + financial stress.     Lives previously with husband who moved out, now with daughter who has medical problems,  including Hodgkin's disease, and granddaughter (age 29).     Uses seat belt.     Past Surgical Hx:  Past Surgical History:  Procedure Laterality Date  . ABDOMINAL HYSTERECTOMY N/A 03/13/2015   Procedure: TOTAL HYSTERECTOMY ABDOMINAL BILATERAL SALPINGO OOPHORECTOMY RADICAL TUMOR [place];  Surgeon: Everitt Amber, MD;  [facility];  Service: Gynecology;  Laterality: N/A;  . BOWEL RESECTION  03/13/2015   Procedure: SMALL BOWEL RESECTION;  Surgeon: Everitt Amber, MD;  [facility];  Service: Gynecology;;  . [place] and 1984  . COLOSTOMY  2008  . COLOSTOMY TAKEDOWN    . LAPAROTOMY N/A 03/13/2015   Procedure: EXPLORATORY LAPAROTOMY;  Surgeon: Everitt Amber, MD;  [facility];  Service: Gynecology;  Laterality: N/A;  . TONSILLECTOMY AND ADENOIDECTOMY  1997  . VENTRAL HERNIA REPAIR  03/13/2015   Procedure: HERNIA REPAIR VENTRAL ADULT;  Surgeon: Everitt Amber, MD;  [facility];  Service: Gynecology;;    Past Medical Hx:  Past Medical History:  Diagnosis Date  . Anemia   . Cough   . Diverticulitis with perforation 2010  . DVT (deep vein thrombosis) in pregnancy ([place]) 11/24/2016  . DVT, lower extremity ([place])   . Endometrial cancer ([employer])   . Family history of adverse reaction to anesthesia    pts daughter had difficulty awakening following anesthesia   . GERD (gastroesophageal reflux disease)   . Headache   . History of bronchitis   . History of ear infections   . Hx of migraines   . Hypertension   . IBS (irritable bowel syndrome)   . Kidney infection   . Lupus (systemic lupus erythematosus) ([place]) 02/2017  . Morbid obesity ([place])   . Ovarian cancer ([place]) dx'd 01/2015  . Pyelonephritis     Past Gynecological History:  C/s x 2  Patient's last menstrual period was 10/26/2013.  Family Hx:  Family History  Problem Relation Age of Onset  . Diabetes Mother   . Hypertension Mother   . Hyperlipidemia Mother   . Colon polyps Mother        4 total  . Fibroids Mother         s/p hysterectomy  . Hyperlipidemia Father   .  Diabetes Daughter   . Hodgkin's lymphoma Daughter 65  . Asthma Maternal Aunt        severe - d. 71  . Prostate cancer Maternal Uncle 38  . Diabetes Paternal Aunt   . Diabetes Paternal Uncle   . Kidney failure Maternal Grandmother 84  . Pancreatic cancer Paternal Grandmother        dx. >50  . Diabetes Paternal Grandfather   . Diabetes Paternal Aunt   . Pancreatic cancer Cousin 94       paternal 1st cousin  . Heart disease Neg Hx   . Stroke Neg Hx     Review of Systems:  Constitutional  Feels well,    ENT Normal appearing ears and nares bilaterally Skin/Breast  + lupus rash on face Cardiovascular  No chest pain, shortness of breath, or edema  Pulmonary  No cough or wheeze.  Gastro Intestinal  No nausea, vomitting, or diarrhoea. No bright red blood per rectum, nochange in bowel movement, or constipation.  Genito Urinary  No frequency, urgency, dysuria,  Musculo Skeletal  + bilateral knee pain with ambulation, + shoulder pain Neurologic  No weakness, numbness, change in gait,  Psychology  No depression, anxiety, insomnia.   Vitals:  Blood pressure 129/80, pulse 70, temperature 98.2 F (36.8 C), temperature source Oral, resp. rate 20, height 5' 5.5" (1.664 m), weight (!) 373 lb (169.2 kg), last menstrual period 10/26/2013, SpO2 100 %.  Physical Exam: WD in NAD Neck  Supple NROM, without any enlargements.  Lymph Node Survey No cervical supraclavicular or inguinal adenopathy Cardiovascular  Pulse normal rate, regularity and rhythm. S1 and S2 normal.  Lungs  Clear to auscultation bilateraly, without wheezes/crackles/rhonchi. Good air movement.  Skin  + facial rash consistent with lupus Psychiatry  Alert and oriented to person, place, and time  Abdomen  Normoactive bowel sounds, abdomen soft, non-tender and obese without palpable evidence of hernia. There is a large midline incision which is healed. Back No CVA  tenderness Genito Urinary  Vulva/vagina: Normal external female genitalia.  No lesions. No discharge or bleeding.  Bladder/urethra:  No lesions or masses, well supported bladder  Vagina: cuff in tact.  Cervix and uterus: surgically absent  Adnexa: no palpable masses. Rectal  Good tone, no masses no cul de sac nodularity.  Extremities  No bilateral cyanosis, clubbing or edema.   Thereasa Solo, MD  09/10/2017, 2:25 PM

## 2017-09-10 NOTE — Patient Instructions (Signed)
Please notify Dr Denman George at phone number [phone] if you notice vaginal bleeding, new pelvic or abdominal pains, bloating, feeling full easy, or a change in bladder or bowel function.   Please return to see Dr Denman George in 6 months. She has ordered a CT scan to be performed before that visit.

## 2017-09-24 ENCOUNTER — Ambulatory Visit: Payer: Self-pay | Admitting: Licensed Clinical Social Worker

## 2017-09-24 DIAGNOSIS — F439 Reaction to severe stress, unspecified: Secondary | ICD-10-CM

## 2017-09-24 NOTE — Progress Notes (Signed)
  Type of Service: [place] F/U Visit Total time:35 minutes :  Interpreter:No.    Reason for follow-up: Continue brief intervention to assist patient with managing symptoms of stress . Reports she is stressed and finds herself worrying about things. Patient is pleasant and engaged in conversation.   Appearance:Well Groomed ; Thought process: Coherent; Affect: Appropriate: No plan to harm self or others  GAD 7 : Generalized Anxiety Score 09/24/2017 08/12/2017 10/25/2015  Nervous, Anxious, on Edge 2 0 0  Control/stop worrying 1 0 0  Worry too much - different things 2 2 1   Trouble relaxing 1 2 0  Restless 1 0 0  Easily annoyed or irritable 1 0 0  Afraid - awful might happen 1 0 0  Total GAD 7 Score 9 4 1   Anxiety Difficulty Not difficult at all Not difficult at all Not difficult at all   Depression screen [facility] 2/9 09/24/2017 08/12/2017 08/06/2017  Decreased Interest 0 0 0  Down, Depressed, Hopeless 0 2 0  PHQ - 2 Score 0 2 0  Altered sleeping 2 2 -  Tired, decreased energy 2 2 -  Change in appetite 2 0 -  Feeling bad or failure about yourself  0 2 -  Trouble concentrating 0 0 -  Moving slowly or fidgety/restless 0 0 -  Suicidal thoughts 0 0 -  PHQ-9 Score 6 8 -  Difficult doing work/chores Not difficult at all Not difficult at all -  Some recent data might be hidden   Goals: Patient will reduce symptoms of: stress , and increase  ability XF:GHWEXH skills, self-management skills and stress reduction, Increase healthy adjustment to current life circumstances. Intervention: Motivational Interviewing and Solution-Focused Strategies, Reflective listening and emotional support.  Issues discussed: managing stressors  ; options with current situation ;  Support system; community resources. Assessment:Patient continues to experience symptoms of stress.  Symptoms exacerbated by current living situation and other life stressors.  Patient may benefit from, and is in agreement to  continue brief therapeutic interventions as needed to assist with managing her stress.  Plan: LCSW will continue solution focus interventions during next office visit. Will adjust interventions based on progress. Patient will work on the plan below prior to next office visit.  1. Patient will F/U with LCSW as needed. 2. Behavioral recommendations: implement solution focus interventions discussed today 3. Referral: none at this time  [name], LCSW Licensed Clinical Social Worker [place]   [phone] 10:33 AM

## 2017-09-28 ENCOUNTER — Telehealth: Payer: Self-pay | Admitting: *Deleted

## 2017-09-28 MED ORDER — RIVAROXABAN 20 MG PO TABS: 20.0000 mg | ORAL_TABLET | Freq: Every day | ORAL | 1 refills | Status: DC

## 2017-09-28 NOTE — Telephone Encounter (Signed)
Pt has been approved to receive meds thru patient assistance ([facility]) for xerelto.  It is still in the processing stage and then they will mail her a card to pick up meds.  She only has 6 pills left.  She is requesting samples to last until then.  2 bottles (7 each) of xerelto 20mg s placed up front for pt to pick up.  She will pick up tomorrow. [place], Salome Spotted, CMA

## 2017-10-07 ENCOUNTER — Ambulatory Visit: Payer: Self-pay

## 2017-10-09 ENCOUNTER — Encounter: Payer: Self-pay | Admitting: Family Medicine

## 2017-10-09 ENCOUNTER — Telehealth: Payer: Self-pay | Admitting: *Deleted

## 2017-10-09 ENCOUNTER — Ambulatory Visit (INDEPENDENT_AMBULATORY_CARE_PROVIDER_SITE_OTHER): Payer: Self-pay | Admitting: Family Medicine

## 2017-10-09 VITALS — BP 130/80 | HR 71 | Temp 98.8°F | Ht 65.5 in | Wt 380.6 lb

## 2017-10-09 DIAGNOSIS — Z760 Encounter for issue of repeat prescription: Secondary | ICD-10-CM | POA: Insufficient documentation

## 2017-10-09 DIAGNOSIS — C561 Malignant neoplasm of right ovary: Secondary | ICD-10-CM

## 2017-10-09 DIAGNOSIS — R05 Cough: Secondary | ICD-10-CM

## 2017-10-09 DIAGNOSIS — B9789 Other viral agents as the cause of diseases classified elsewhere: Secondary | ICD-10-CM

## 2017-10-09 DIAGNOSIS — R059 Cough, unspecified: Secondary | ICD-10-CM

## 2017-10-09 DIAGNOSIS — J069 Acute upper respiratory infection, unspecified: Secondary | ICD-10-CM

## 2017-10-09 MED ORDER — BENZONATATE 100 MG PO CAPS: 100.0000 mg | ORAL_CAPSULE | Freq: Three times a day (TID) | ORAL | 0 refills | Status: DC | PRN

## 2017-10-09 MED ORDER — PANTOPRAZOLE SODIUM 40 MG PO TBEC: 40.0000 mg | DELAYED_RELEASE_TABLET | Freq: Every day | ORAL | 1 refills | Status: DC

## 2017-10-09 MED ORDER — TRIAMCINOLONE ACETONIDE 0.1 % EX CREA: TOPICAL_CREAM | CUTANEOUS | 1 refills | Status: DC

## 2017-10-09 NOTE — Assessment & Plan Note (Signed)
Patient with productive cough, sore throat, fever, chills, body aches, bilateral earache.  Likely viral in etiology as patient has multiple sick contacts.  Patient with only 2+ criteria in Centor criteria so unlikely strep throat, patient also without erythema in posterior oropharynx.  Sore throat is likely secondary to increased cough.  Rib pain is also likely secondary to increased coughing.  Encouraged conservative measures of honey, warm liquids such as tea or soup, and increasing fluid intake.  Prescription for [facility] also given as patient is having difficulty sleeping due to increased coughing.  Strict return precautions given, and encourage patient to follow-up in 1 to 2 weeks if no improvement of symptoms. -Tessalon Perles 3 times daily as needed -Follow-up in 1 to 2 weeks if no improvement in symptoms or sooner if worsening

## 2017-10-09 NOTE — Patient Instructions (Signed)
You have a cold and it should start to get better about 7 - 10 days after it started.    Take the tessalon perles as needed for cough up to 3 times a day  For your cough, try honey as this has been found to work as effective as tessalon perles for cough   Some other therapies you can try are: push fluids, rest, gargle warm salt water and return office visit prn if symptoms persist or worsen.   Drinking warm liquids such as teas and soups can help with secretions and cough. A mist humidifier or vaporizer can work well to help with secretions and cough.  It is very important to clean the humidifier between use according to the instructions.    Even if you don't have an appetite yet please try and keep up your fluid intake with fluids such as G2 gatorade to help replenish your salts in you body.   It was good to see you.  If you're still having trouble in the next week, come back and see [place].    Of course, if you start having trouble breathing, worsening fevers, vomiting and unable to hold down any fluids, or you have other concerns, don't hesitate to come back or go to the ED after hours.   I hope you start feeling better soon!  Sincerely,   Caroline More, DO

## 2017-10-09 NOTE — Assessment & Plan Note (Signed)
Patient requesting refill of Protonix and triamcinolone cream.  Refill sent to patient's desired pharmacy, and patient aware that prescription was sent.

## 2017-10-09 NOTE — Progress Notes (Signed)
   Subjective:    Patient ID: [name], female    DOB: 1966-02-14, 52 y.o.   MRN: [id number]   CC: Cough, sore throat, bilateral ear pain  HPI: Cough, sore throat, bilateral ear pain Patient presenting today with 1 week of cold like symptoms.  Patient says symptoms began 5 days ago at which time she began to have chills and body aches.  She at that point felt like she had very little energy and also began to have a cough.  2 days ago patient started having a fever, T-max was 101.2.  Fever broke 1 day later and temperature has been 98 since then.  Patient endorses chills and continued sore throat and earache which she thinks is secondary to her cough. Patient has history of tonsillectomy. Patient also has headache.  Patient says because she is been coughing so much her ribs also hurt.  Patient says because she coughs so much at night she also notices some wheezing.  Patient reports green mucus production with cough.  Patient has multiple sick contacts.  Her father was recently diagnosed with bronchitis, her mother had a sinus infection, and she thinks her daughter had strep throat.  Patient does not have exposure to young children.  Medication refill Patient requesting refill of her triamcinolone and Protonix  Objective:  BP 130/80   Pulse 71   Temp 98.8 F (37.1 C) (Oral)   Ht 5' 5.5" (1.664 m)   Wt (!) 380 lb 9.6 oz (172.6 kg)   LMP 10/26/2013   SpO2 100%   BMI 62.37 kg/m  Vitals and nursing note reviewed  General: well nourished, mask placed over mouth and nose HEENT: normocephalic, TM's visualized bilaterally, no scleral icterus or conjunctival pallor, no nasal discharge, moist mucous membranes, slight edema of nasal passageways good dentition without erythema or discharge noted in posterior oropharynx Neck: supple, non-tender, some cervical lymphadenopathy Cardiac: RRR, clear S1 and S2, no murmurs, rubs, or gallops Respiratory: clear to auscultation bilaterally, no  increased work of breathing Abdomen: soft, nontender, nondistended, no masses or organomegaly. Bowel sounds present Extremities: no edema or cyanosis. Warm, well perfused. 2+ radial pulses bilaterally Skin: warm and dry, no rashes noted Neuro: alert and oriented, no focal deficits  Assessment & Plan:    Viral URI with cough Patient with productive cough, sore throat, fever, chills, body aches, bilateral earache.  Likely viral in etiology as patient has multiple sick contacts.  Patient with only 2+ criteria in Centor criteria so unlikely strep throat, patient also without erythema in posterior oropharynx.  Sore throat is likely secondary to increased cough.  Rib pain is also likely secondary to increased coughing.  Encouraged conservative measures of honey, warm liquids such as tea or soup, and increasing fluid intake.  Prescription for [facility] also given as patient is having difficulty sleeping due to increased coughing.  Strict return precautions given, and encourage patient to follow-up in 1 to 2 weeks if no improvement of symptoms. -Tessalon Perles 3 times daily as needed -Follow-up in 1 to 2 weeks if no improvement in symptoms or sooner if worsening  Medication refill Patient requesting refill of Protonix and triamcinolone cream.  Refill sent to patient's desired pharmacy, and patient aware that prescription was sent.   Return in about 2 weeks (around 10/23/2017), or if symptoms worsen or fail to improve.   Caroline More, DO, PGY-2

## 2017-10-09 NOTE — Telephone Encounter (Signed)
Pt lm on nurse line because the cough meds was not available @ [facility].  Asked that we resend to [facility].  Resent as requested.  Attempted to call pt back, no asnwer.  LM informing that RX was sent as requested.  Taylon Coole, Salome Spotted, CMA

## 2017-10-19 ENCOUNTER — Other Ambulatory Visit: Payer: Self-pay

## 2017-10-19 MED ORDER — RIVAROXABAN 20 MG PO TABS: 20.0000 mg | ORAL_TABLET | Freq: Every day | ORAL | 0 refills | Status: DC

## 2017-10-19 NOTE — Telephone Encounter (Signed)
Patient called for samples of Xarelto 20 mg. Her patient assistance application for this is still being process through MAP. I called Dawn at [place] and verified.  2 bottles of samples at front for pick up. Patient aware.  Danley Danker, RN [facility] RN)

## 2017-10-22 ENCOUNTER — Ambulatory Visit: Payer: Medicaid Other

## 2017-10-23 ENCOUNTER — Ambulatory Visit: Payer: Medicaid Other

## 2017-10-23 ENCOUNTER — Ambulatory Visit (INDEPENDENT_AMBULATORY_CARE_PROVIDER_SITE_OTHER): Payer: Self-pay | Admitting: Family Medicine

## 2017-10-23 ENCOUNTER — Other Ambulatory Visit: Payer: Self-pay

## 2017-10-23 VITALS — BP 126/68 | HR 66 | Temp 97.7°F | Ht 65.5 in | Wt 364.8 lb

## 2017-10-23 DIAGNOSIS — R5383 Other fatigue: Secondary | ICD-10-CM

## 2017-10-23 NOTE — Patient Instructions (Signed)
Thank you for coming in to see [place] today. Please see below to review our plan for today's visit.  I will call you with the results of your labs.  I am not certain what is causing her fatigue and this will likely need further work-up.  Please schedule a follow-up appointment with your primary care physician when you leave today.  Please call the clinic at [phone] if your symptoms worsen or you have any concerns. It was our pleasure to serve you.  Harriet Butte, [place], PGY-3

## 2017-10-23 NOTE — Assessment & Plan Note (Signed)
Acute on chronic.  Uncertain etiology.  Differential includes OSA, lupus flare, recurrence of endometrial or ovarian cancer, anemia.  Less likely VTE while on chronic Xarelto for prior PE 1 year ago.  No signs of fluid overload though difficult to assess based on morbid obesity. - Checking CBC with differential, CMET, TSH, ESR, CA 125 - Reviewed return precautions - Advised patient to follow-up with PCP and oncologist

## 2017-10-23 NOTE — Progress Notes (Signed)
Subjective   Patient ID: [name]    DOB: 07/01/1965, 51 y.o. female   MRN: [id number]  CC: "Fatigue"  HPI: [name] is a 51 y.o. female who presents to clinic today for the following:  FATIGUE Patient complains of fatigue for the last 6 days which is worse than her baseline fatigue for many months The Tiredness is heavy sensation on legs bilaterally. It is best described as heaviness  Symptoms Fever: no Sweating at night: no Weight Loss: yes Shortness of Breath: no Coughing up Blood: no Muscle Pain or Weakness: no Black or bloody Stools: no Severe Snoring or Daytime Sleepiness: yes to both Feeling Down: no Not enjoying things: no Rash: no Leg or Joint Swelling: no Chest Pain or irregular heart beat:  no  Of note, patient is currently in remission for ovarian cancer and endometrial cancer.  She was last seen by her oncologist 1 month ago but was unable to obtain her CA 125 level due to loss of insurance this year.  She also has known lupus not on DMARDs or followed by a rheumatologist due to lack of insurance.  She denies history prior sleep studies.  She is adherent to her daily Xarelto since her pulmonary embolism last year.  ROS: see HPI for pertinent.  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  Surgical history TAH, C-section, T&A, ventral hernia, bowel resection.  Family history DM, HTN, HLD, fibroids.  Smoking status reviewed. Medications reviewed.  Objective   BP 126/68   Pulse 66   Temp 97.7 F (36.5 C) (Oral)   Ht 5' 5.5" (1.664 m)   Wt (!) 364 lb 12.8 oz (165.5 kg)   LMP 10/26/2013   SpO2 99%   BMI 59.78 kg/m  Vitals and nursing note reviewed.  General: morbidly obese, well nourished, well developed, NAD with non-toxic appearance HEENT: normocephalic, atraumatic, moist mucous membranes, pink conjunctivae, pink tongue Neck: supple, non-tender without  cervical or axillary lymphadenopathy, no thyromegaly Cardiovascular: regular rate and rhythm without murmurs, rubs, or gallops Lungs: clear to auscultation bilaterally with normal work of breathing Skin: warm, dry, no rashes or lesions, cap refill < 2 seconds Extremities: warm and well perfused, normal tone, no edema  Assessment & Plan   Fatigue Acute on chronic.  Uncertain etiology.  Differential includes OSA, lupus flare, recurrence of endometrial or ovarian cancer, anemia.  Less likely VTE while on chronic Xarelto for prior PE 1 year ago.  No signs of fluid overload though difficult to assess based on morbid obesity. - Checking CBC with differential, CMET, TSH, ESR, CA 125 - Reviewed return precautions - Advised patient to follow-up with PCP and oncologist  Orders Placed This Encounter  Procedures  . CBC with Differential/Platelet  . CMP14+EGFR  . TSH  . Sedimentation Rate  . CA 125   No orders of the defined types were placed in this encounter.   David McMullen, DO [facility], PGY-3 10/23/2017, 12:30 PM 

## 2017-10-24 LAB — CBC WITH DIFFERENTIAL/PLATELET
BASOS ABS: 0 10*3/uL (ref 0.0–0.2)
BASOS: 1 %
EOS (ABSOLUTE): 0.1 10*3/uL (ref 0.0–0.4)
Eos: 2 %
Hematocrit: 41.6 % (ref 34.0–46.6)
Hemoglobin: 13.6 g/dL (ref 11.1–15.9)
IMMATURE GRANS (ABS): 0 10*3/uL (ref 0.0–0.1)
IMMATURE GRANULOCYTES: 1 %
LYMPHS: 33 %
Lymphocytes Absolute: 2 10*3/uL (ref 0.7–3.1)
MCH: 29.2 pg (ref 26.6–33.0)
MCHC: 32.7 g/dL (ref 31.5–35.7)
MCV: 90 fL (ref 79–97)
Monocytes Absolute: 0.4 10*3/uL (ref 0.1–0.9)
Monocytes: 7 %
Neutrophils Absolute: 3.5 10*3/uL (ref 1.4–7.0)
Neutrophils: 56 %
PLATELETS: 238 10*3/uL (ref 150–450)
RBC: 4.65 x10E6/uL (ref 3.77–5.28)
RDW: 14.1 % (ref 12.3–15.4)
WBC: 6 10*3/uL (ref 3.4–10.8)

## 2017-10-24 LAB — CMP14+EGFR
ALT: 7 IU/L (ref 0–32)
AST: 12 IU/L (ref 0–40)
Albumin/Globulin Ratio: 1.6 (ref 1.2–2.2)
Albumin: 4.5 g/dL (ref 3.5–5.5)
Alkaline Phosphatase: 100 IU/L (ref 39–117)
BUN / CREAT RATIO: 26 — AB (ref 9–23)
BUN: 34 mg/dL — AB (ref 6–24)
Bilirubin Total: 0.2 mg/dL (ref 0.0–1.2)
CHLORIDE: 104 mmol/L (ref 96–106)
CO2: 20 mmol/L (ref 20–29)
Calcium: 10 mg/dL (ref 8.7–10.2)
Creatinine, Ser: 1.31 mg/dL — ABNORMAL HIGH (ref 0.57–1.00)
GFR, EST AFRICAN AMERICAN: 54 mL/min/{1.73_m2} — AB (ref >59)
GFR, EST NON AFRICAN AMERICAN: 47 mL/min/{1.73_m2} — AB (ref >59)
GLUCOSE: 96 mg/dL (ref 65–99)
Globulin, Total: 2.9 g/dL (ref 1.5–4.5)
POTASSIUM: 4.9 mmol/L (ref 3.5–5.2)
SODIUM: 140 mmol/L (ref 134–144)
TOTAL PROTEIN: 7.4 g/dL (ref 6.0–8.5)

## 2017-10-24 LAB — CA 125: Cancer Antigen (CA) 125: 3.8 U/mL (ref 0.0–38.1)

## 2017-10-24 LAB — TSH: TSH: 2.69 u[IU]/mL (ref 0.450–4.500)

## 2017-10-24 LAB — SEDIMENTATION RATE: Sed Rate: 20 mm/hr (ref 0–40)

## 2017-10-26 ENCOUNTER — Telehealth: Payer: Self-pay | Admitting: Family Medicine

## 2017-10-26 NOTE — Telephone Encounter (Signed)
Contacted patient regarding normal results from visit on 10/23/2017.  Patient without further questions or concerns.  Harriet Butte, [place], PGY-3

## 2017-10-27 ENCOUNTER — Other Ambulatory Visit: Payer: Self-pay | Admitting: Obstetrics and Gynecology

## 2017-10-27 DIAGNOSIS — Z1231 Encounter for screening mammogram for malignant neoplasm of breast: Secondary | ICD-10-CM

## 2017-11-03 MED FILL — XARELTO 20 MG TABLET: 20 | 30 days supply | Qty: 30 | Fill #1

## 2017-11-11 ENCOUNTER — Ambulatory Visit (INDEPENDENT_AMBULATORY_CARE_PROVIDER_SITE_OTHER): Payer: Self-pay | Admitting: Family Medicine

## 2017-11-11 ENCOUNTER — Encounter: Payer: Self-pay | Admitting: Family Medicine

## 2017-11-11 ENCOUNTER — Other Ambulatory Visit: Payer: Self-pay

## 2017-11-11 VITALS — BP 118/70 | HR 54 | Temp 98.2°F | Ht 66.0 in | Wt 367.6 lb

## 2017-11-11 DIAGNOSIS — M25512 Pain in left shoulder: Secondary | ICD-10-CM

## 2017-11-11 DIAGNOSIS — R768 Other specified abnormal immunological findings in serum: Secondary | ICD-10-CM

## 2017-11-11 DIAGNOSIS — G8929 Other chronic pain: Secondary | ICD-10-CM

## 2017-11-11 DIAGNOSIS — M25561 Pain in right knee: Secondary | ICD-10-CM

## 2017-11-11 DIAGNOSIS — R5383 Other fatigue: Secondary | ICD-10-CM

## 2017-11-11 MED ORDER — METHYLPREDNISOLONE ACETATE 40 MG/ML IJ SUSP
40.0000 mg | Freq: Once | INTRAMUSCULAR | Status: AC
  Administered 2017-11-11: 40 mg via INTRAMUSCULAR

## 2017-11-11 NOTE — Assessment & Plan Note (Signed)
Persistent.   Per patient, rheumatology is concerned for lupus.  Does have anti-centromere Abs positive.   Unsure if she is eating enough on keto diet.  She will use recipe book/list to ensure she is getting enough calories each meal.   Hgb normal last check.

## 2017-11-11 NOTE — Progress Notes (Signed)
Subjective:    [name] is a 52 y.o. female who presents to [facility] today for fatigue and pain:  1.  Fatigue:  Ongoing.  Has gone back to keto diet and expected a positive change but has been doing this about 1 month without much relief.  Did go through the "keto flu" phase.  Pain sometimes awakens her from sleep.  She is doing water aerobics twice weekly which helps with her fatigue.  No bleeding.    2.  Right knee pain:  Has BL knee pain but right knee is worse.  Used to get steroid shots at Ortho which helped for months but hasn't had these for over 6 months due to insurance.  No falls.  Does lock on her.  Walks with cane.    3.  Left shoulder pain:  New issue.  Present for past month or so.  Worse when trying to lift arms above head.  Can only lift to about 90 degrees forward or abduction, not much higher due to pain.  No injury.  Gradually worsening.  Sister with torn rotator cuff.    ROS as above per HPI.   The following portions of the patient's history were reviewed and updated as appropriate: allergies, current medications, past medical history, family and social history, and problem list. Patient is a nonsmoker.    PMH reviewed.  Past Medical History:  Diagnosis Date  . Anemia   . Cough   . Diverticulitis with perforation 2010  . DVT (deep vein thrombosis) in pregnancy ([place]) 11/24/2016  . DVT, lower extremity ([place])   . Endometrial cancer ([place])   . Family history of adverse reaction to anesthesia    pts daughter had difficulty awakening following anesthesia   . GERD (gastroesophageal reflux disease)   . Headache   . History of bronchitis   . History of ear infections   . Hx of migraines   . Hypertension   . IBS (irritable bowel syndrome)   . Kidney infection   . Lupus (systemic lupus erythematosus) ([place]) 02/2017  . Morbid obesity ([place])   . Ovarian cancer ([place]) dx'd 01/2015  . Pyelonephritis    Past Surgical History:  Procedure Laterality Date  . ABDOMINAL  HYSTERECTOMY N/A 03/13/2015   Procedure: TOTAL HYSTERECTOMY ABDOMINAL BILATERAL SALPINGO OOPHORECTOMY RADICAL TUMOR [place];  Surgeon: Everitt Amber, MD;  Location: [facility];  Service: Gynecology;  Laterality: N/A;  . BOWEL RESECTION  03/13/2015   Procedure: SMALL BOWEL RESECTION;  Surgeon: Everitt Amber, MD;  Location: [facility];  Service: Gynecology;;  . [place] and 1984  . COLOSTOMY  2008  . COLOSTOMY TAKEDOWN    . LAPAROTOMY N/A 03/13/2015   Procedure: EXPLORATORY LAPAROTOMY;  Surgeon: Everitt Amber, MD;  Location: [facility];  Service: Gynecology;  Laterality: N/A;  . TONSILLECTOMY AND ADENOIDECTOMY  1997  . VENTRAL HERNIA REPAIR  03/13/2015   Procedure: HERNIA REPAIR VENTRAL ADULT;  Surgeon: Everitt Amber, MD;  Location: [facility];  Service: Gynecology;;    Medications reviewed. Current Outpatient Medications  Medication Sig Dispense Refill  . acetaminophen (TYLENOL) 500 MG tablet Take 1,000 mg by mouth every 6 (six) hours as needed for mild pain, moderate pain, fever or headache. Reported on 09/10/2015    . cetirizine (ZYRTEC) 10 MG tablet Take 1 tablet (10 mg total) by mouth daily as needed for allergies (itching). 30 tablet 0  . furosemide (LASIX) 20 MG tablet Take 1 tab by mouth daily x 1 week, then PRN  daily after that for leg swelling (Patient not taking: Reported on 10/23/2017) 30 tablet 0  . gabapentin (NEURONTIN) 100 MG capsule Take 100 mg by mouth at bedtime as needed.     . Iron-Vitamins (GERITOL COMPLETE) TABS Take 1 tablet by mouth daily.    Marland Kitchen lidocaine-prilocaine (EMLA) cream Apply to PAC site 1-2 hours prior to access as needed. 30 g 2  . Oral Electrolytes ([employer] ELECTROLYTE SOLUTION PO) Take 20-40 drops by mouth daily. Place 20-40 drops in liquid daily    . pantoprazole (PROTONIX) 40 MG tablet Take 1 tablet (40 mg total) by mouth daily. 30 tablet 1  . rivaroxaban (XARELTO) 20 MG TABS tablet Take 1 tablet (20 mg total) by mouth daily with supper. 14 tablet 0  . triamcinolone cream  (KENALOG) 0.1 % APPLY TO THE AFFECTED AREA(S) TWICE DAILY AS NEEDED 30 g 1   No current facility-administered medications for this visit.    Facility-Administered Medications Ordered in Other Visits  Medication Dose Route Frequency Provider Last Rate Last Dose  . sodium chloride flush (NS) 0.9 % injection 10 mL  10 mL Intravenous PRN Livesay, Lennis P, MD   10 mL at 05/31/15 0300  . sodium chloride flush (NS) 0.9 % injection 10 mL  10 mL Intravenous PRN Livesay, Lennis P, MD   10 mL at 08/02/15 1442     Objective:   Physical Exam BP 118/70   Pulse (!) 54   Temp 98.2 F (36.8 C) (Oral)   Ht 5\' 6"  (1.676 m)   Wt (!) 367 lb 9.6 oz (166.7 kg)   LMP 10/26/2013   SpO2 99%   BMI 59.33 kg/m  Gen:  Alert, cooperative patient who appears stated age in no acute distress.  Vital signs reviewed. HEENT: EOMI,  MMM Cardiac:  Regular rate and rhythm Pulm:  Clear to auscultation bilaterally  Abd:  Soft/nondistended/nontender.   Right knee:  Some landmarks obscured due to body habitus.  TTP along joint line BL.  No joint laxity noted.   Left shoulder:  TTP along anterior shoulder.  Some in biceps groove.  No redness or effusion.  Able to abduct to about 90 degrees forward and laterally.  External and internal rotation are good, though does have some pain with internal rotation.    No results found for this or any previous visit (from the past 72 hour(s)).

## 2017-11-11 NOTE — Assessment & Plan Note (Signed)
Informed consent obtained and placed in chart.  Time out performed.  Area cleaned with iodine x 3 and wiped clear with alcohol swab.  Using 25 gauge needle 1 cc Kenalog and 3 cc's 1% Lidocaine were injected in knee via medial approach.  Sterile bandage placed.  Patient tolerated procedure well.  No complications.    FU in 3 months to assess for improvement.  Sooner if worsening or not improving despite injection.

## 2017-11-11 NOTE — Assessment & Plan Note (Signed)
Seems to be most likely rotator cuff tear.  She would like to see Sports Medicine.  Will refer to Sports med today for ultrasound and evaluation.

## 2017-11-11 NOTE — Assessment & Plan Note (Signed)
Wants to go back to [facility].  May or may not be accepted into [facility] due to insurance status.  If so, will refer to [facility].

## 2017-11-11 NOTE — Patient Instructions (Addendum)
It was good to see you again today!  We did a Corticosteroid shot in Right knee.  Contact the Rheumatologist for an upcoming appointment.  I have referred you to Sports Medicine for your Left shoulder.      Keep up your diet and make sure that you are eating enough.    Come back and see me in about 6 weeks.

## 2017-11-18 ENCOUNTER — Ambulatory Visit (INDEPENDENT_AMBULATORY_CARE_PROVIDER_SITE_OTHER): Payer: Self-pay | Admitting: Sports Medicine

## 2017-11-18 ENCOUNTER — Encounter: Payer: Self-pay | Admitting: Sports Medicine

## 2017-11-18 VITALS — BP 134/73 | Ht 65.5 in | Wt 365.0 lb

## 2017-11-18 DIAGNOSIS — M7582 Other shoulder lesions, left shoulder: Secondary | ICD-10-CM

## 2017-11-18 DIAGNOSIS — G8929 Other chronic pain: Secondary | ICD-10-CM

## 2017-11-18 DIAGNOSIS — M25512 Pain in left shoulder: Secondary | ICD-10-CM

## 2017-11-18 MED ORDER — METHYLPREDNISOLONE ACETATE 40 MG/ML IJ SUSP
40.0000 mg | Freq: Once | INTRAMUSCULAR | Status: AC
  Administered 2017-11-18: 40 mg via INTRA_ARTICULAR

## 2017-11-18 NOTE — Patient Instructions (Signed)
Thank you for coming to see [place] today in clinic.  You were treated today for left shoulder pain.  We believe this is secondary to some inflammation in your rotator cuff muscles.  You were given a steroid injection into your left shoulder today.  We hope this will provide you with some relief while you undergo physical therapy.  Physical therapy will be the main treatment for the condition that you have.  We will see back in 4 weeks for follow-up.

## 2017-11-18 NOTE — Progress Notes (Signed)
HPI  CC: Left shoulder pain  [name] is a 52 year old female with history of ovarian cancer (recent chemotherapy and tamoxifen), recently diagnosed lupus, and chronic kidney disease who presents today for left shoulder pain.  She states that the shoulder pain began around 1 year ago when she was undergoing chemo and tamoxifen therapy for ovarian cancer.  She states that she had many joints that H during that time.  She does not remember any trauma to the area.  She states the pain is lingered beyond the rest of her joints in her left shoulder.  She is tried tramadol and Tylenol without any relief.  She states the pain is worse at nighttime especially when she rolls in the affected side.  She states she has difficulty with overhead activities.  She states is been getting progressively worse over the past several months.  She states she is able to get her arm overhead, but it takes a lot of effort.  She denies any numbness and tingling in the arm.  She denies any weakness in the arm.  She has not had prior injections in that shoulder.  She has had a knee injection previously.  Past Injuries: None to the affected arm Past Surgeries: Total hysterectomy 2016 Smoking: Non-smoker Family Hx: Sister with rotator cuff tear in 2018  All past medical history, medications, allergies reviewed by myself at today's visit.  ROS: Per HPI; in addition no fever, no rash, no additional weakness, no additional numbness, no additional paresthesias, and no additional falls/injury.   Objective: BP 134/73   Ht 5' 5.5" (1.664 m)   Wt (!) 365 lb (165.6 kg)   LMP 10/26/2013   BMI 59.82 kg/m  Gen: Right-Hand Dominant. NAD, well groomed, a/o x3, normal affect.  CV: Well-perfused. Warm.  Resp: Non-labored.  Neuro: Sensation intact throughout. [place] coordination deficits.  Gait: nonpathologic posture, unremarkable stride without signs of limp or balance issues.  Left shoulder exam: No erythema, warmth, swelling  noted.  No sulcus sign.  Tenderness to palpation over the anterior shoulder, tenderness to palpation over the [facility] joint.  Full range of motion and external rotation (45 degrees), around 90 degrees of forward flexion with full range of motion passively (with pain), around 60 degrees of abduction with full range of motion passively (with pain).  Negative Neer's, negative Hawkins, negative speeds, positive empty can test, positive crossover test, unable to tolerate apprehension test.  ULTRASOUND: Shoulder,  Diagnostic limited ultrasound imaging obtained of patient's left shoulder.  - No obvious evidence of bony deformity or osteophyte development appreciated.  - Long head of the biceps tendon: No evidence of tendon thickening, calcification, subluxation, or tearing in short or long axis views. No edema or bullseye sign.  - Subscapularis tendon: complete visualization across the width of the insertion point yielded no evidence of tendon thickening, calcification, or tears in the long axis view.  - Supraspinatus tendon: complete visualization across the width of the insertion point showed an area of hypoechogenicity at the proximal portion of the supraspinatus tendon. - Infraspinatus and teres minor tendons: visualization across the width of the insertion points yielded no evidence of tendon thickening, calcification, or tears in the long axis view.  [facility] Joint: No evidence of joint separation, collapse, or osteophyte development appreciated. Effusion present with geyser sign.  IMPRESSION: findings consistent with supraspinatus tendinopathy.   Assessment and Plan: Rotator cuff tendinosis, left side, likely involving the supraspinatus muscle.  INJECTION: Patient was given informed consent, signed copy in  the chart. Appropriate time out was taken. Area prepped and draped in usual sterile fashion. 1 cc of methylprednisolone 40 mg/ml plus  3 cc of 1% lidocaine without epinephrine was injected into the  subacromial space using a(n) posterior lateral approach. The patient tolerated the procedure well. There were no complications. Post procedure instructions were given.  Based on patient's physical exam, ultrasound findings, and history I believe patient has a supraspinatus tendinopathy.  We will start her in physical therapy today for both strengthening and range of motion.  We provided her with some limited home exercises today to start on her own as well.  She can continue with tramadol and Tylenol as prescribed by her primary provider (she has CKD stage III and cannot take NSAIDs).  Provided her with a subacromial corticosteroid injection as noted above.  Patient tolerated procedure well.  We will see her back in clinic in 4 weeks.  If she has no improvement, or has worsening of her symptoms at that time, we can consider getting an MRI to further evaluate the rotator cuff.  I would also consider injecting her AC joint, as she had some inflammation noted in the area on both physical exam and ultrasound.  Alison Rife, MD [name] Sports Medicine Fellow 11/18/2017 11:11 AM

## 2017-12-01 ENCOUNTER — Other Ambulatory Visit: Payer: Self-pay

## 2017-12-01 ENCOUNTER — Ambulatory Visit: Payer: Self-pay | Attending: Sports Medicine | Admitting: Physical Therapy

## 2017-12-01 DIAGNOSIS — M25612 Stiffness of left shoulder, not elsewhere classified: Secondary | ICD-10-CM | POA: Insufficient documentation

## 2017-12-01 DIAGNOSIS — R252 Cramp and spasm: Secondary | ICD-10-CM | POA: Insufficient documentation

## 2017-12-01 DIAGNOSIS — G8929 Other chronic pain: Secondary | ICD-10-CM | POA: Insufficient documentation

## 2017-12-01 DIAGNOSIS — M25512 Pain in left shoulder: Secondary | ICD-10-CM | POA: Insufficient documentation

## 2017-12-01 DIAGNOSIS — R293 Abnormal posture: Secondary | ICD-10-CM | POA: Insufficient documentation

## 2017-12-01 NOTE — Therapy (Signed)
[place], [place], [address] Phone: [phone]   Fax:  [phone]  Physical Therapy Evaluation  Patient Details  Name: [name] MRN: [id number] Date of Birth: 04-13-1965 Referring Provider: Candice Camp MD   Encounter Date: 12/01/2017  PT End of Session - 12/01/17 0929    Visit Number  1    Number of Visits  16    Date for PT Re-Evaluation  01/26/18    Authorization Type  CAFA  09/10/17 to 03/12/18    PT Start Time  0845    PT Stop Time  0930    PT Time Calculation (min)  45 min    Activity Tolerance  Patient limited by pain    Behavior During Therapy  [facility] for tasks assessed/performed       Past Medical History:  Diagnosis Date  . Anemia   . Cough   . Diverticulitis with perforation 2010  . DVT (deep vein thrombosis) in pregnancy ([place]) 11/24/2016  . DVT, lower extremity ([place])   . Endometrial cancer ([place])   . Family history of adverse reaction to anesthesia    pts daughter had difficulty awakening following anesthesia   . GERD (gastroesophageal reflux disease)   . Headache   . History of bronchitis   . History of ear infections   . Hx of migraines   . Hypertension   . IBS (irritable bowel syndrome)   . Kidney infection   . Lupus (systemic lupus erythematosus) ([place]) 02/2017  . Morbid obesity ([place])   . Ovarian cancer ([place]) dx'd 01/2015  . Pyelonephritis     Past Surgical History:  Procedure Laterality Date  . ABDOMINAL HYSTERECTOMY N/A 03/13/2015   Procedure: TOTAL HYSTERECTOMY ABDOMINAL BILATERAL SALPINGO OOPHORECTOMY RADICAL TUMOR [place];  Surgeon: Everitt Amber, MD;  Location: [facility];  Service: Gynecology;  Laterality: N/A;  . BOWEL RESECTION  03/13/2015   Procedure: SMALL BOWEL RESECTION;  Surgeon: Everitt Amber, MD;  Location: [facility];  Service: Gynecology;;  . [place] and 1984  . COLOSTOMY  2008  . COLOSTOMY TAKEDOWN    . LAPAROTOMY N/A 03/13/2015   Procedure:  EXPLORATORY LAPAROTOMY;  Surgeon: Everitt Amber, MD;  Location: [facility];  Service: Gynecology;  Laterality: N/A;  . TONSILLECTOMY AND ADENOIDECTOMY  1997  . VENTRAL HERNIA REPAIR  03/13/2015   Procedure: HERNIA REPAIR VENTRAL ADULT;  Surgeon: Everitt Amber, MD;  Location: [facility];  Service: Gynecology;;    There were no vitals filed for this visit.   Subjective Assessment - 12/01/17 0848    Subjective  My left shld has been hurting for about a year.  I am a double cancer survivor with ovarian and endometrial CA.  I have most trouble at night sleeping.  Recently had cortisone injection 2 weeks ago. first few days better but I am back to hurting again.    Pertinent History  chronic kidney dz(Stage 3)  ovarian stage 3/ endometrial , recently with SLE, uses a cane due to right meniscus, obesity, See extensive medical history    Limitations  Lifting;House hold activities    How long can you sit comfortably?  unlimited    How long can you stand comfortably?  unlimted    How long can you walk comfortably?  unlimited    Diagnostic tests  ultrasound dx    Patient Stated Goals  I would like avoid surgery, I want to be able to put on my bra and comb  my hair and sleep on my left side , ADL's    Currently in Pain?  Yes    Pain Score  7     Pain Location  Shoulder    Pain Orientation  Left    Pain Descriptors / Indicators  Dull;Aching    Pain Type  Chronic pain    Pain Onset  More than a month ago   12 months but worsening   Pain Frequency  Constant    Aggravating Factors   unable to sleep on left shoulder and reach above your head and combing hair and putting on deoderant.     Pain Relieving Factors  heating pad         [facility] PT Assessment - 12/01/17 0847      Assessment   Medical Diagnosis  left chronic shoulder pain    Referring Provider  Candice Camp MD    Onset Date/Surgical Date  --   pain in left shld for about a year   Hand Dominance  Right    Next MD Visit  4-6 weeks    Prior Therapy   none      Precautions   Precautions  None      Restrictions   Weight Bearing Restrictions  No      Balance Screen   Has the patient fallen in the past 6 months  No    Has the patient had a decrease in activity level because of a fear of falling?   No    Is the patient reluctant to leave their home because of a fear of falling?   No      Home Environment   Living Environment  Private residence    Living Arrangements  Parent    Type of [place] Access  Level entry    Home Layout  One level      Prior Function   Level of Independence  Independent with household mobility with device      Cognition   Overall Cognitive Status  Within Functional Limits for tasks assessed      Observation/Other Assessments   Focus on Therapeutic Outcomes (FOTO)   FOTO intake 34% limitaiton 66%  predicted 37%      Sensation   Light Touch  Appears Intact    Stereognosis  Appears Intact    Hot/Cold  Appears Intact    Proprioception  Appears Intact      Posture/Postural Control   Posture/Postural Control  Postural limitations    Postural Limitations  Rounded Shoulders;Forward head    Posture Comments  obesity      ROM / Strength   AROM / PROM / Strength  AROM;Strength;PROM      AROM   Overall AROM   Deficits    Right Shoulder Extension  45 Degrees    Right Shoulder Flexion  147 Degrees    Right Shoulder ABduction  135 Degrees    Right Shoulder Internal Rotation  62 Degrees    Right Shoulder External Rotation  93 Degrees    Left Shoulder Flexion  74 Degrees   painful   Left Shoulder ABduction  70 Degrees    Left Shoulder Internal Rotation  29 Degrees   painful   Left Shoulder External Rotation  40 Degrees   pain ful     PROM   Overall PROM   Deficits    Overall PROM Comments  painful with all motions on  left  Right Shoulder Flexion  155 Degrees    Right Shoulder ABduction  145 Degrees    Left Shoulder Flexion  110 Degrees   painful arc   Left Shoulder ABduction  100  Degrees      Strength   Overall Strength  Deficits    Right Shoulder Flexion  4+/5    Right Shoulder ABduction  4+/5    Right Shoulder Internal Rotation  4+/5    Right Shoulder External Rotation  4+/5    Left Shoulder Flexion  3-/5    Left Shoulder ABduction  3-/5    Left Shoulder Internal Rotation  3-/5    Left Shoulder External Rotation  3-/5      Palpation   Palpation comment  tenderness over supraspinatus on left marked tenderness, Pt with tight left IR and ER and pain throughout flexion/abduction/ ER and IR all movement.                  Objective measurements completed on examination: See above findings.      [facility] Adult PT Treatment/Exercise - 12/01/17 0847      Self-Care   Self-Care  Other Self-Care Comments;Heat/Ice Application    Heat/Ice Application  discussed use of ice at night for sleep and heat before exercise to promote comfort for exercises    Other Self-Care Comments   brief posture education and use of towel at night under left axilla for comfort sleeping      Shoulder Exercises: Supine   Other Supine Exercises  supine cane flexion and ER left 10 x 2 each              PT Education - 12/01/17 0913    Education Details  POC Explanation of findings. intial posture, exercise and use of heat and ice    Person(s) Educated  Patient    Methods  Explanation;Demonstration;Tactile cues;Verbal cues;Handout    Comprehension  Verbalized understanding;Returned demonstration       PT Short Term Goals - 12/01/17 1236      PT SHORT TERM GOAL #1   Title  Pt will be independent with initial HEP    Time  3    Period  Weeks    Status  New    Target Date  12/22/17      PT SHORT TERM GOAL #2   Title  Pt will be able to use UE to increase AROM to at least 100 degrees flexion in order to lift items in kitch to shelf at shoulder height    Time  3    Period  Weeks    Status  New    Target Date  12/22/17      PT SHORT TERM GOAL #3   Title  "Demonstrate  understanding of proper sitting posture, body mechanics, work ergonomics, and be more conscious of position and posture throughout the day.     Time  3    Period  Weeks    Status  New    Target Date  12/22/17        PT Long Term Goals - 12/01/17 1246      PT LONG TERM GOAL #1   Title  "Pt will be independent with advanced HEP.     Time  8    Period  Weeks    Status  New    Target Date  01/26/18      PT LONG TERM GOAL #2   Title  left  shoulder IR and  ER will return to [facility] to return to pain-free ADLs such as dressing and grooming.     Time  8    Period  Weeks    Status  New    Target Date  01/26/18      PT LONG TERM GOAL #3   Title  "FOTO will improve from  66%limitation   to  37% limitation    indicating improved functional mobility .     Time  6    Period  Weeks    Status  New    Target Date  01/26/18      PT LONG TERM GOAL #4   Title  left shoulder AROM scaption will improve to 0-150degrees for improved overhead reaching.     Time  8    Period  Weeks    Status  New    Target Date  01/26/18      PT LONG TERM GOAL #5   Title  Pt will be able to sleep on left side at night with 50% greater comfort for more restorative sleep at night    Time  8    Period  Weeks    Status  New    Target Date  01/26/18             Plan - 12/01/17 0858    Clinical Impression Statement  52 yo female ovarian/endometrial cancer survivor presents with signs and symptoms compatible with rotator cuff tendinitis (supraspinatus). Pt presents with impairments including pain, limited ROM, weakness, and as a result, pt is limited with ADLs and functional activities. Pt would benefit from skilled outpatient PT services for 2 times a week for 8 weeks to progress toward pain-free PLOF.     History and Personal Factors relevant to plan of care:  chronic kidney dz(Stage 3)  ovarian stage 3/ endometrial , recently with SLE, uses a cane due to right meniscus, obesity, See extensive medical history     Clinical Presentation  Evolving    Clinical Decision Making  Moderate    Rehab Potential  Good    PT Frequency  2x / week    PT Duration  6 weeks    PT Treatment/Interventions  ADLs/Self Care Home Management;Cryotherapy;Electrical Stimulation;Iontophoresis 4mg /ml Dexamethasone;Moist Heat;Ultrasound;Therapeutic exercise;Therapeutic activities;Neuromuscular re-education;Patient/family education;Manual techniques;Passive range of motion;Dry needling;Taping    PT Next Visit Plan  modalities, Iontophoresis?, review brief exercises given and progress as pt tolerates    PT Home Exercise Plan  supine cane flexion and ER ,        Patient will benefit from skilled therapeutic intervention in order to improve the following deficits and impairments:  Pain, Postural dysfunction, Improper body mechanics, Decreased range of motion, Decreased strength, Increased fascial restricitons, Increased muscle spasms, Impaired flexibility, Impaired UE functional use  Visit Diagnosis: Chronic left shoulder pain  Stiffness of left shoulder, not elsewhere classified  Abnormal posture  Cramp and spasm     Problem List Patient Active Problem List   Diagnosis Date Noted  . Left shoulder pain 11/11/2017  . Viral URI with cough 10/09/2017  . Medication refill 10/09/2017  . Enlarged lymph nodes 08/13/2017  . Cramping of hands 12/31/2016  . Fatigue 11/13/2016  . Elevated antinuclear antibody (ANA) level 09/23/2016  . Eczema 01/27/2016  . Portacath in place 09/12/2015  . Long term current use of anticoagulant therapy 09/06/2015  . Slow rate of speech 08/22/2015  . Chemotherapy-induced peripheral neuropathy ([place]) 06/13/2015  . CKD (chronic kidney disease) stage 3, GFR  30-59 ml/min ([place]) 05/12/2015  . Esophageal reflux 05/12/2015  . Ovarian carcinosarcoma, right ([place]) 03/29/2015  . Endometrial ca ([place]) 03/29/2015  . Ventral hernia without obstruction or gangrene   . Morbid obesity with BMI of 60.0-69.9,  adult ([place]) 02/19/2015  . Essential hypertension, benign   . Stress at home 12/14/2014  . Left knee pain 08/04/2013  . Seasonal allergies 07/29/2013  . Right knee pain 11/28/2011   Voncille Lo, PT Certified Exercise Expert for the Aging Adult  12/01/17 1:07 PM Phone: [phone] Fax: [place] [facility] [address] [place], [place], [address] Phone: [phone]   Fax:  [phone]  Name: [name] MRN: [id number] Date of Birth: April 07, 1966

## 2017-12-01 NOTE — Patient Instructions (Addendum)
Cane Overhead - Supine  Hold cane at thighs with both hands, extend arms straight over head. Hold _3__ seconds. Repeat _10_x_ times. Do _3__ times per day.  External Rotation (Eccentric), Active-Assist - Supine (Cane)  Lie on back, affected arm out from side, elbow at 90, forearm forward. Use cane to assist in lifting forearm of affected arm to neutral. Slowly lower for 3-5 seconds. _10__ reps per set, __2_ sets per day, __7_ days per week.   Copyright  VHI. All rights reserved.   Use ice at night to decrease nerve conduction velocity so you can go to sleep.  Use heat during the day to help you move with exercise    Cryotherapy Cryotherapy means treatment with cold. Ice or gel packs can be used to reduce both pain and swelling. Ice is the most helpful within the first 24 to 48 hours after an injury or flare-up from overusing a muscle or joint. Sprains, strains, spasms, burning pain, shooting pain, and aches can all be eased with ice. Ice can also be used when recovering from surgery. Ice is effective, has very few side effects, and is safe for most people to use. PRECAUTIONS  Ice is not a safe treatment option for people with:  Raynaud phenomenon. This is a condition affecting small blood vessels in the extremities. Exposure to cold may cause your problems to return.  Cold hypersensitivity. There are many forms of cold hypersensitivity, including:  Cold urticaria. Red, itchy hives appear on the skin when the tissues begin to warm after being iced.  Cold erythema. This is a red, itchy rash caused by exposure to cold.  Cold hemoglobinuria. Red blood cells break down when the tissues begin to warm after being iced. The hemoglobin that carry oxygen are passed into the urine because they cannot combine with blood proteins fast enough.  Numbness or altered sensitivity in the area being iced. If you have any of the following conditions, do not use ice until you have discussed cryotherapy  with your caregiver:  Heart conditions, such as arrhythmia, angina, or chronic heart disease.  High blood pressure.  Healing wounds or open skin in the area being iced.  Current infections.  Rheumatoid arthritis.  Poor circulation.  Diabetes. Ice slows the blood flow in the region it is applied. This is beneficial when trying to stop inflamed tissues from spreading irritating chemicals to surrounding tissues. However, if you expose your skin to cold temperatures for too long or without the proper protection, you can damage your skin or nerves. Watch for signs of skin damage due to cold. HOME CARE INSTRUCTIONS Follow these tips to use ice and cold packs safely.  Place a dry or damp towel between the ice and skin. A damp towel will cool the skin more quickly, so you may need to shorten the time that the ice is used.  For a more rapid response, add gentle compression to the ice.  Ice for no more than 10 to 20 minutes at a time. The bonier the area you are icing, the less time it will take to get the benefits of ice.  Check your skin after 5 minutes to make sure there are no signs of a poor response to cold or skin damage.  Rest 20 minutes or more between uses.  Once your skin is numb, you can end your treatment. You can test numbness by very lightly touching your skin. The touch should be so light that you do not see the skin  dimple from the pressure of your fingertip. When using ice, most people will feel these normal sensations in this order: cold, burning, aching, and numbness.  Do not use ice on someone who cannot communicate their responses to pain, such as small children or people with dementia. HOW TO MAKE AN ICE PACK Ice packs are the most common way to use ice therapy. Other methods include ice massage, ice baths, and cryosprays. Muscle creams that cause a cold, tingly feeling do not offer the same benefits that ice offers and should not be used as a substitute unless  recommended by your caregiver. To make an ice pack, do one of the following:  Place crushed ice or a bag of frozen vegetables in a sealable plastic bag. Squeeze out the excess air. Place this bag inside another plastic bag. Slide the bag into a pillowcase or place a damp towel between your skin and the bag.  Mix 3 parts water with 1 part rubbing alcohol. Freeze the mixture in a sealable plastic bag. When you remove the mixture from the freezer, it will be slushy. Squeeze out the excess air. Place this bag inside another plastic bag. Slide the bag into a pillowcase or place a damp towel between your skin and the bag. SEEK MEDICAL CARE IF:  You develop white spots on your skin. This may give the skin a blotchy (mottled) appearance.  Your skin turns blue or pale.  Your skin becomes waxy or hard.  Your swelling gets worse. MAKE SURE YOU:   Understand these instructions.  Will watch your condition.  Will get help right away if you are not doing well or get worse. Document Released: 11/18/2010 Document Revised: 08/08/2013 Document Reviewed: 11/18/2010 [facility] Patient Information 2015 [place], [place]. This information is not intended to replace advice given to you by your health care provider. Make sure you discuss any questions you have with your health care provider.  Posture Tips DO: - stand tall and erect - keep chin tucked in - keep head and shoulders in alignment - check posture regularly in mirror or large window - pull head back against headrest in car seat;  Change your position often.  Sit with lumbar support. DON'T: - slouch or slump while watching TV or reading - sit, stand or lie in one position  for too long;  Sitting is especially hard on the spine so if you sit at a desk/use the computer, then stand up often!   Copyright  VHI. All rights reserved.  Posture - Standing   Good posture is important. Avoid slouching and forward head thrust. Maintain curve in low back and align  ears over shoul- ders, hips over ankles.  Pull your belly button in toward your back bone. Ribs lifting up and chin down.     Copyright  VHI. All rights reserved.  Posture - Sitting   Sit upright, head facing forward. Try using a roll to support lower back. Keep shoulders relaxed, and avoid rounded back. Keep hips level with knees. Avoid crossing legs for long periods.  Sit on sit bones not tail bone  Copyright  VHI. All rights reserved.      Voncille Lo, PT Certified Exercise Expert for the Aging Adult  12/01/17 8:51 AM Phone: [phone] Fax: [phone]

## 2017-12-03 ENCOUNTER — Ambulatory Visit: Payer: Self-pay | Admitting: Physical Therapy

## 2017-12-03 ENCOUNTER — Encounter: Payer: Self-pay | Admitting: Physical Therapy

## 2017-12-03 DIAGNOSIS — R252 Cramp and spasm: Secondary | ICD-10-CM

## 2017-12-03 DIAGNOSIS — R293 Abnormal posture: Secondary | ICD-10-CM

## 2017-12-03 DIAGNOSIS — M25612 Stiffness of left shoulder, not elsewhere classified: Secondary | ICD-10-CM

## 2017-12-03 DIAGNOSIS — M25512 Pain in left shoulder: Principal | ICD-10-CM

## 2017-12-03 DIAGNOSIS — G8929 Other chronic pain: Secondary | ICD-10-CM

## 2017-12-03 NOTE — Therapy (Signed)
[place], [place], [address] Phone: [phone]   Fax:  [phone]  Physical Therapy Treatment  Patient Details  Name: [name] MRN: [id number] Date of Birth: June 05, 1965 Referring Provider: Candice Camp MD   Encounter Date: 12/03/2017  PT End of Session - 12/03/17 1727    Visit Number  2    Number of Visits  16    Date for PT Re-Evaluation  01/26/18    PT Start Time  1634    PT Stop Time  1720    PT Time Calculation (min)  46 min    Activity Tolerance  Patient tolerated treatment well    Behavior During Therapy  [facility] for tasks assessed/performed       Past Medical History:  Diagnosis Date  . Anemia   . Cough   . Diverticulitis with perforation 2010  . DVT (deep vein thrombosis) in pregnancy ([place]) 11/24/2016  . DVT, lower extremity ([place])   . Endometrial cancer ([place])   . Family history of adverse reaction to anesthesia    pts daughter had difficulty awakening following anesthesia   . GERD (gastroesophageal reflux disease)   . Headache   . History of bronchitis   . History of ear infections   . Hx of migraines   . Hypertension   . IBS (irritable bowel syndrome)   . Kidney infection   . Lupus (systemic lupus erythematosus) ([place]) 02/2017  . Morbid obesity ([place])   . Ovarian cancer ([place]) dx'd 01/2015  . Pyelonephritis     Past Surgical History:  Procedure Laterality Date  . ABDOMINAL HYSTERECTOMY N/A 03/13/2015   Procedure: TOTAL HYSTERECTOMY ABDOMINAL BILATERAL SALPINGO OOPHORECTOMY RADICAL TUMOR [place];  Surgeon: Everitt Amber, MD;  [facility];  Service: Gynecology;  Laterality: N/A;  . BOWEL RESECTION  03/13/2015   Procedure: SMALL BOWEL RESECTION;  Surgeon: Everitt Amber, MD;  [facility];  Service: Gynecology;;  . [place] and 1984  . COLOSTOMY  2008  . COLOSTOMY TAKEDOWN    . LAPAROTOMY N/A 03/13/2015   Procedure: EXPLORATORY LAPAROTOMY;  Surgeon: Everitt Amber, MD;  [facility];  Service: Gynecology;  Laterality: N/A;  . TONSILLECTOMY AND ADENOIDECTOMY  1997  . VENTRAL HERNIA REPAIR  03/13/2015   Procedure: HERNIA REPAIR VENTRAL ADULT;  Surgeon: Everitt Amber, MD;  [facility];  Service: Gynecology;;    There were no vitals filed for this visit.  Subjective Assessment - 12/03/17 1639    Subjective  Tape helped me.  I slept better.  I use heat during the day and ice at night.      Currently in Pain?  Yes    Pain Score  6     Pain Orientation  Left    Pain Descriptors / Indicators  Sore    Pain Type  Chronic pain    Aggravating Factors   touching ,  more movement    Pain Relieving Factors  heating pai,, ice,, tape    Multiple Pain Sites  --   right knee  chronic,  sore,  medial   better with sitting.                      [facility] Adult PT Treatment/Exercise - 12/03/17 0001      Self-Care   Other Self-Care Comments   ADL posture modificationd demo for patient,, t      Shoulder Exercises: Supine   Other  Supine Exercises  chest press, serratus,  flexion moderate cues      Manual Therapy   Kinesiotex  Inhibit Muscle;Ligament Correction      Kinesiotix   Inhibit Muscle   supraspinatus   deltiod,    Ligament Correction  Posture correction             PT Education - 12/03/17 1723    Education Details  ADL form,  Tape.  posture,  shoulder shiedl for bra strap    Person(s) Educated  Patient    Methods  Explanation;Demonstration;Tactile cues;Verbal cues;Handout    Comprehension  Verbalized understanding;Returned demonstration       PT Short Term Goals - 12/01/17 1236      PT SHORT TERM GOAL #1   Title  Pt will be independent with initial HEP    Time  3    Period  Weeks    Status  New    Target Date  12/22/17      PT SHORT TERM GOAL #2   Title  Pt will be able to use UE to increase AROM to at least 100 degrees flexion in order to lift items in kitch to shelf at shoulder height    Time  3    Period   Weeks    Status  New    Target Date  12/22/17      PT SHORT TERM GOAL #3   Title  "Demonstrate understanding of proper sitting posture, body mechanics, work ergonomics, and be more conscious of position and posture throughout the day.     Time  3    Period  Weeks    Status  New    Target Date  12/22/17        PT Long Term Goals - 12/01/17 1246      PT LONG TERM GOAL #1   Title  "Pt will be independent with advanced HEP.     Time  8    Period  Weeks    Status  New    Target Date  01/26/18      PT LONG TERM GOAL #2   Title  left  shoulder IR and ER will return to [facility] to return to pain-free ADLs such as dressing and grooming.     Time  8    Period  Weeks    Status  New    Target Date  01/26/18      PT LONG TERM GOAL #3   Title  "FOTO will improve from  66%limitation   to  37% limitation    indicating improved functional mobility .     Time  6    Period  Weeks    Status  New    Target Date  01/26/18      PT LONG TERM GOAL #4   Title  left shoulder AROM scaption will improve to 0-150degrees for improved overhead reaching.     Time  8    Period  Weeks    Status  New    Target Date  01/26/18      PT LONG TERM GOAL #5   Title  Pt will be able to sleep on left side at night with 50% greater comfort for more restorative sleep at night    Time  8    Period  Weeks    Status  New    Target Date  01/26/18            Plan -  12/03/17 1727    Clinical Impression Statement  Tape was helpful so reapplied.  less pain noted.  supine cane shoulder flexion 140 degrees,  Focus also on posture ed,  ADL.  Good technique difficult due to right knee pain.  patient declined cold pack at end of session.    PT Next Visit Plan  modalities, Iontophoresis?, review brief exercises given and progress as pt tolerates.  retape as needed ,, answer any ADL questions. consider isometric IR/ER.    PT Home Exercise Plan  supine cane flexion and ER ,     Consulted and Agree with Plan of Care   Patient       Patient will benefit from skilled therapeutic intervention in order to improve the following deficits and impairments:     Visit Diagnosis: Chronic left shoulder pain  Stiffness of left shoulder, not elsewhere classified  Abnormal posture  Cramp and spasm     Problem List Patient Active Problem List   Diagnosis Date Noted  . Left shoulder pain 11/11/2017  . Viral URI with cough 10/09/2017  . Medication refill 10/09/2017  . Enlarged lymph nodes 08/13/2017  . Cramping of hands 12/31/2016  . Fatigue 11/13/2016  . Elevated antinuclear antibody (ANA) level 09/23/2016  . Eczema 01/27/2016  . Portacath in place 09/12/2015  . Long term current use of anticoagulant therapy 09/06/2015  . Slow rate of speech 08/22/2015  . Chemotherapy-induced peripheral neuropathy ([place]) 06/13/2015  . CKD (chronic kidney disease) stage 3, GFR 30-59 ml/min (HCC) 05/12/2015  . Esophageal reflux 05/12/2015  . Ovarian carcinosarcoma, right ([place]) 03/29/2015  . Endometrial ca ([place]) 03/29/2015  . Ventral hernia without obstruction or gangrene   . Morbid obesity with BMI of 60.0-69.9, adult ([place]) 02/19/2015  . Essential hypertension, benign   . Stress at home 12/14/2014  . Left knee pain 08/04/2013  . Seasonal allergies 07/29/2013  . Right knee pain 11/28/2011    Demetris Meinhardt PTA 12/03/2017, 5:31 PM  [facility] [address] [place], [place], [address] Phone: [phone]   Fax:  [phone]  Name: LINZY LAURY MRN: [id number] Date of Birth: 1965-05-23

## 2017-12-03 NOTE — Patient Instructions (Signed)

## 2017-12-08 ENCOUNTER — Ambulatory Visit: Payer: Self-pay | Attending: Sports Medicine | Admitting: Physical Therapy

## 2017-12-08 ENCOUNTER — Encounter: Payer: Self-pay | Admitting: Physical Therapy

## 2017-12-08 DIAGNOSIS — R293 Abnormal posture: Secondary | ICD-10-CM | POA: Insufficient documentation

## 2017-12-08 DIAGNOSIS — R252 Cramp and spasm: Secondary | ICD-10-CM | POA: Insufficient documentation

## 2017-12-08 DIAGNOSIS — G8929 Other chronic pain: Secondary | ICD-10-CM | POA: Insufficient documentation

## 2017-12-08 DIAGNOSIS — M25612 Stiffness of left shoulder, not elsewhere classified: Secondary | ICD-10-CM | POA: Insufficient documentation

## 2017-12-08 DIAGNOSIS — M25512 Pain in left shoulder: Secondary | ICD-10-CM | POA: Insufficient documentation

## 2017-12-08 NOTE — Therapy (Signed)
[place], [place], [address] Phone: [phone]   Fax:  [phone]  Physical Therapy Treatment  Patient Details  Name: [name] MRN: [id number] Date of Birth: Dec 13, 1965 Referring Provider: Candice Camp MD   Encounter Date: 12/08/2017  PT End of Session - 12/08/17 1231    Visit Number  3    Number of Visits  16    Date for PT Re-Evaluation  01/26/18    PT Start Time  0935    PT Stop Time  1015    PT Time Calculation (min)  40 min    Activity Tolerance  Patient tolerated treatment well    Behavior During Therapy  [facility] for tasks assessed/performed       Past Medical History:  Diagnosis Date  . Anemia   . Cough   . Diverticulitis with perforation 2010  . DVT (deep vein thrombosis) in pregnancy ([place]) 11/24/2016  . DVT, lower extremity ([place])   . Endometrial cancer ([place])   . Family history of adverse reaction to anesthesia    pts daughter had difficulty awakening following anesthesia   . GERD (gastroesophageal reflux disease)   . Headache   . History of bronchitis   . History of ear infections   . Hx of migraines   . Hypertension   . IBS (irritable bowel syndrome)   . Kidney infection   . Lupus (systemic lupus erythematosus) ([place]) 02/2017  . Morbid obesity ([place])   . Ovarian cancer ([place]) dx'd 01/2015  . Pyelonephritis     Past Surgical History:  Procedure Laterality Date  . ABDOMINAL HYSTERECTOMY N/A 03/13/2015   Procedure: TOTAL HYSTERECTOMY ABDOMINAL BILATERAL SALPINGO OOPHORECTOMY RADICAL TUMOR [place];  Surgeon: Everitt Amber, MD;  [facility];  Service: Gynecology;  Laterality: N/A;  . BOWEL RESECTION  03/13/2015   Procedure: SMALL BOWEL RESECTION;  Surgeon: Everitt Amber, MD;  [facility];  Service: Gynecology;;  . [place] and 1984  . COLOSTOMY  2008  . COLOSTOMY TAKEDOWN    . LAPAROTOMY N/A 03/13/2015   Procedure: EXPLORATORY LAPAROTOMY;  Surgeon: Everitt Amber, MD;  [facility];  Service: Gynecology;  Laterality: N/A;  . TONSILLECTOMY AND ADENOIDECTOMY  1997  . VENTRAL HERNIA REPAIR  03/13/2015   Procedure: HERNIA REPAIR VENTRAL ADULT;  Surgeon: Everitt Amber, MD;  [facility];  Service: Gynecology;;    There were no vitals filed for this visit.  Subjective Assessment - 12/08/17 0934    Subjective  Tape continues to help,  It does not stay on.  Pain is now intermittant.  I am sleeping a little better.  I am doing the exercises.    Currently in Pain?  Yes    Pain Score  6     Pain Location  Shoulder    Pain Orientation  Left    Pain Descriptors / Indicators  Sore;Aching    Pain Frequency  Intermittent    Aggravating Factors   touching,  sleeping on it    Pain Relieving Factors  rolling towel under arm,  tape  ice    Effect of Pain on Daily Activities  ADL limites    Multiple Pain Sites  Yes                       [facility] Adult PT Treatment/Exercise - 12/08/17 0001      Self-Care   Other Self-Care Comments   showed neutral  spine in sidelying      Shoulder Exercises: Supine   Horizontal ABduction Limitations  10 X  AA cane  pain end range   7/10  Adduction   External Rotation  10 reps    External Rotation Limitations  to neutral  7/10  pain    Flexion  10 reps;AAROM   cues,  tends to use triceps.  4/10 pain   Other Supine Exercises  chest press 10 X cane      Shoulder Exercises: Seated   Flexion Limitations  table slides ,  HEP      Shoulder Exercises: Sidelying   Flexion  10 reps;AAROM   triceps pressure 7/10UE ranger,  and serratue 10 x each     Shoulder Exercises: Isometric Strengthening   External Rotation Limitations  10 x 5 seconds no pain    Internal Rotation Limitations  10 x 5 seconds   mild pain needed cues to ease     Shoulder Exercises: Stretch   Table Stretch -Flexion Limitations  added to HEP      Manual Therapy   Manual therapy comments  light soft tissue work,  Financial risk analyst work light   medial sacpul;ar area  and shoulder girdle all tender,  light only tolerated      Kinesiotix   Inhibit Muscle   supraspinatus   deltiod, and teres   3 I strips   noted red area from tape anterior shoulder ,  avoided            PT Education - 12/08/17 1228    Education Details  HEP    Person(s) Educated  Patient    Methods  Explanation;Demonstration;Verbal cues;Handout    Comprehension  Verbalized understanding;Returned demonstration       PT Short Term Goals - 12/08/17 9509      PT SHORT TERM GOAL #1   Title  Pt will be independent with initial HEP    Baseline  needs minor cues    Time  3    Period  Weeks    Status  On-going      PT SHORT TERM GOAL #2   Title  Pt will be able to use UE to increase AROM to at least 100 degrees flexion in order to lift items in kitch to shelf at shoulder height    Baseline  112    Time  3    Period  Weeks    Status  Achieved      PT SHORT TERM GOAL #3   Baseline  Moe conscience.     Time  3    Period  Weeks    Status  On-going        PT Long Term Goals - 12/01/17 1246      PT LONG TERM GOAL #1   Title  "Pt will be independent with advanced HEP.     Time  8    Period  Weeks    Status  New    Target Date  01/26/18      PT LONG TERM GOAL #2   Title  left  shoulder IR and ER will return to [facility] to return to pain-free ADLs such as dressing and grooming.     Time  8    Period  Weeks    Status  New    Target Date  01/26/18      PT LONG TERM GOAL #3   Title  "FOTO will improve from  66%limitation  to  37% limitation    indicating improved functional mobility .     Time  6    Period  Weeks    Status  New    Target Date  01/26/18      PT LONG TERM GOAL #4   Title  left shoulder AROM scaption will improve to 0-150degrees for improved overhead reaching.     Time  8    Period  Weeks    Status  New    Target Date  01/26/18      PT LONG TERM GOAL #5   Title  Pt will be able to sleep on left side at night with 50% greater  comfort for more restorative sleep at night    Time  8    Period  Weeks    Status  New    Target Date  01/26/18            Plan - 12/08/17 1234    Clinical Impression Statement  Tape continues to be helpful, however it does not stay on mort than 102 days.   Pain is now intermittant.  She continues to need cues with her HEP.  Sensitivity is improving. to palpation .     PT Next Visit Plan  modalities, Iontophoresis?,  progress as pt tolerates.  retape as needed . consider isometric IR/ER for HEP,  review table slides    PT Home Exercise Plan  supine cane flexion and ER , Table slides.      Consulted and Agree with Plan of Care  Patient       Patient will benefit from skilled therapeutic intervention in order to improve the following deficits and impairments:     Visit Diagnosis: Chronic left shoulder pain  Stiffness of left shoulder, not elsewhere classified  Abnormal posture  Cramp and spasm     Problem List Patient Active Problem List   Diagnosis Date Noted  . Left shoulder pain 11/11/2017  . Viral URI with cough 10/09/2017  . Medication refill 10/09/2017  . Enlarged lymph nodes 08/13/2017  . Cramping of hands 12/31/2016  . Fatigue 11/13/2016  . Elevated antinuclear antibody (ANA) level 09/23/2016  . Eczema 01/27/2016  . Portacath in place 09/12/2015  . Long term current use of anticoagulant therapy 09/06/2015  . Slow rate of speech 08/22/2015  . Chemotherapy-induced peripheral neuropathy ([place]) 06/13/2015  . CKD (chronic kidney disease) stage 3, GFR 30-59 ml/min (HCC) 05/12/2015  . Esophageal reflux 05/12/2015  . Ovarian carcinosarcoma, right ([place]) 03/29/2015  . Endometrial ca ([place]) 03/29/2015  . Ventral hernia without obstruction or gangrene   . Morbid obesity with BMI of 60.0-69.9, adult ([place]) 02/19/2015  . Essential hypertension, benign   . Stress at home 12/14/2014  . Left knee pain 08/04/2013  . Seasonal allergies 07/29/2013  . Right knee pain  11/28/2011    Jihan Rudy PTA 12/08/2017, 12:40 PM  [facility] [address] [place], [place], [address] Phone: [phone]   Fax:  [phone]  Name: KYLEA BERRONG MRN: [id number] Date of Birth: 1966-03-06

## 2017-12-08 NOTE — Patient Instructions (Signed)
Flexion (Passive)    Sitting upright, slide forearm forward along table, bending from the waist until a stretch is felt. Hold _5-10___ seconds. Repeat __5-10__ times. Do _1-3___ sessions per day.  Copyright  VHI. All rights reserved.

## 2017-12-09 MED FILL — XARELTO 20 MG TABLET: 20 | 30 days supply | Qty: 30 | Fill #2

## 2017-12-14 ENCOUNTER — Encounter: Payer: Self-pay | Admitting: Physical Therapy

## 2017-12-14 ENCOUNTER — Ambulatory Visit: Payer: Self-pay | Admitting: Physical Therapy

## 2017-12-14 DIAGNOSIS — G8929 Other chronic pain: Secondary | ICD-10-CM

## 2017-12-14 DIAGNOSIS — M25512 Pain in left shoulder: Principal | ICD-10-CM

## 2017-12-14 DIAGNOSIS — M25612 Stiffness of left shoulder, not elsewhere classified: Secondary | ICD-10-CM

## 2017-12-14 DIAGNOSIS — R252 Cramp and spasm: Secondary | ICD-10-CM

## 2017-12-14 DIAGNOSIS — R293 Abnormal posture: Secondary | ICD-10-CM

## 2017-12-14 NOTE — Therapy (Signed)
[place], [place], [address] Phone: [phone]   Fax:  [phone]  Physical Therapy Treatment  Patient Details  Name: [name] MRN: [id number] Date of Birth: 03/24/1966 Referring Provider: Candice Camp MD   Encounter Date: 12/14/2017  PT End of Session - 12/14/17 1337    Visit Number  4    Number of Visits  16    Date for PT Re-Evaluation  01/26/18    Authorization Type  CAFA  09/10/17 to 03/12/18    PT Start Time  1105    PT Stop Time  1150    PT Time Calculation (min)  45 min    Activity Tolerance  Patient tolerated treatment well    Behavior During Therapy  [facility] for tasks assessed/performed       Past Medical History:  Diagnosis Date  . Anemia   . Cough   . Diverticulitis with perforation 2010  . DVT (deep vein thrombosis) in pregnancy ([place]) 11/24/2016  . DVT, lower extremity ([place])   . Endometrial cancer ([place])   . Family history of adverse reaction to anesthesia    pts daughter had difficulty awakening following anesthesia   . GERD (gastroesophageal reflux disease)   . Headache   . History of bronchitis   . History of ear infections   . Hx of migraines   . Hypertension   . IBS (irritable bowel syndrome)   . Kidney infection   . Lupus (systemic lupus erythematosus) ([place]) 02/2017  . Morbid obesity ([place])   . Ovarian cancer ([place]) dx'd 01/2015  . Pyelonephritis     Past Surgical History:  Procedure Laterality Date  . ABDOMINAL HYSTERECTOMY N/A 03/13/2015   Procedure: TOTAL HYSTERECTOMY ABDOMINAL BILATERAL SALPINGO OOPHORECTOMY RADICAL TUMOR [place];  Surgeon: Everitt Amber, MD;  [facility];  Service: Gynecology;  Laterality: N/A;  . BOWEL RESECTION  03/13/2015   Procedure: SMALL BOWEL RESECTION;  Surgeon: Everitt Amber, MD;  [facility];  Service: Gynecology;;  . [place] and 1984  . COLOSTOMY  2008  . COLOSTOMY TAKEDOWN    . LAPAROTOMY N/A 03/13/2015    Procedure: EXPLORATORY LAPAROTOMY;  Surgeon: Everitt Amber, MD;  [facility];  Service: Gynecology;  Laterality: N/A;  . TONSILLECTOMY AND ADENOIDECTOMY  1997  . VENTRAL HERNIA REPAIR  03/13/2015   Procedure: HERNIA REPAIR VENTRAL ADULT;  Surgeon: Everitt Amber, MD;  [facility];  Service: Gynecology;;    There were no vitals filed for this visit.  Subjective Assessment - 12/14/17 1107    Subjective  I had gotten a whole lot better until i reached for something just over my head at the grocery store and pain is a back.  I re injured. Tape lasted until this morning.    Currently in Pain?  Yes    Pain Score  7     Pain Location  Shoulder    Pain Orientation  Left    Pain Descriptors / Indicators  Sore;Pressure;Tender    Pain Type  Chronic pain    Pain Radiating Towards  down arm to elbow,  medial arm    Aggravating Factors   moving arm to the right and  LT,  sleeping on it ,  reaching.  touching  with bra strap  or hand    Pain Relieving Factors  ice,  tape    Effect of Pain on Daily Activities  ADL  limits    Multiple Pain  Sites  --   left knee pain.  8/10  chronic  4/10 in the morning,  worse with 4/10,    medial knee.                        [place] Adult PT Treatment/Exercise - 12/14/17 0001      Self-Care   Other Self-Care Comments   showed patient how to use cane,  also discussed living life well practices.       Shoulder Exercises: Isometric Strengthening   External Rotation Limitations  10 X 5    Internal Rotation Limitations  10 x 5      Kinesiotix   Inhibit Muscle   deltoid, supraspinatus,   teres minor             PT Education - 12/14/17 1337    Education Details  Self care.    Person(s) Educated  Patient    Methods  Explanation;Demonstration;Verbal cues    Comprehension  Verbalized understanding;Returned demonstration       PT Short Term Goals - 12/08/17 0938      PT SHORT TERM GOAL #1   Title  Pt will be independent with initial HEP     Baseline  needs minor cues    Time  3    Period  Weeks    Status  On-going      PT SHORT TERM GOAL #2   Title  Pt will be able to use UE to increase AROM to at least 100 degrees flexion in order to lift items in kitch to shelf at shoulder height    Baseline  112    Time  3    Period  Weeks    Status  Achieved      PT SHORT TERM GOAL #3   Baseline  Moe conscience.     Time  3    Period  Weeks    Status  On-going        PT Long Term Goals - 12/01/17 1246      PT LONG TERM GOAL #1   Title  "Pt will be independent with advanced HEP.     Time  8    Period  Weeks    Status  New    Target Date  01/26/18      PT LONG TERM GOAL #2   Title  left  shoulder IR and ER will return to [facility] to return to pain-free ADLs such as dressing and grooming.     Time  8    Period  Weeks    Status  New    Target Date  01/26/18      PT LONG TERM GOAL #3   Title  "FOTO will improve from  66%limitation   to  37% limitation    indicating improved functional mobility .     Time  6    Period  Weeks    Status  New    Target Date  01/26/18      PT LONG TERM GOAL #4   Title  left shoulder AROM scaption will improve to 0-150degrees for improved overhead reaching.     Time  8    Period  Weeks    Status  New    Target Date  01/26/18      PT LONG TERM GOAL #5   Title  Pt will be able to sleep on left side at night with 50% greater comfort  for more restorative sleep at night    Time  8    Period  Weeks    Status  New    Target Date  01/26/18            Plan - 12/14/17 1338    Clinical Impression Statement  Tape is lasting longer now.  Shoulder had improved until she reached overhead at the store.  Self care main focus today.  Patient interested in self care,  multiple questions answered.   She was able to have decreased knee pain with ed for use of cane.     PT Next Visit Plan  modalities, Iontophoresis?,  progress as pt tolerates.  retape as needed . consider isometric IR/ER for HEP,   review table slides    PT Home Exercise Plan  supine cane flexion and ER , Table slides.      Consulted and Agree with Plan of Care  Patient       Patient will benefit from skilled therapeutic intervention in order to improve the following deficits and impairments:     Visit Diagnosis: Chronic left shoulder pain  Stiffness of left shoulder, not elsewhere classified  Abnormal posture  Cramp and spasm     Problem List Patient Active Problem List   Diagnosis Date Noted  . Left shoulder pain 11/11/2017  . Viral URI with cough 10/09/2017  . Medication refill 10/09/2017  . Enlarged lymph nodes 08/13/2017  . Cramping of hands 12/31/2016  . Fatigue 11/13/2016  . Elevated antinuclear antibody (ANA) level 09/23/2016  . Eczema 01/27/2016  . Portacath in place 09/12/2015  . Long term current use of anticoagulant therapy 09/06/2015  . Slow rate of speech 08/22/2015  . Chemotherapy-induced peripheral neuropathy ([place]) 06/13/2015  . CKD (chronic kidney disease) stage 3, GFR 30-59 ml/min (HCC) 05/12/2015  . Esophageal reflux 05/12/2015  . Ovarian carcinosarcoma, right ([place]) 03/29/2015  . Endometrial ca ([place]) 03/29/2015  . Ventral hernia without obstruction or gangrene   . Morbid obesity with BMI of 60.0-69.9, adult ([place]) 02/19/2015  . Essential hypertension, benign   . Stress at home 12/14/2014  . Left knee pain 08/04/2013  . Seasonal allergies 07/29/2013  . Right knee pain 11/28/2011    Joren Rehm PTA 12/14/2017, 1:42 PM  [facility] [address] [place], [place], [address] Phone: [phone]   Fax:  [phone]  Name: [name] MRN: [id number] Date of Birth: 1965-09-16

## 2017-12-16 ENCOUNTER — Encounter: Payer: Self-pay | Admitting: Physical Therapy

## 2017-12-16 ENCOUNTER — Ambulatory Visit: Payer: Self-pay | Admitting: Physical Therapy

## 2017-12-16 DIAGNOSIS — M25612 Stiffness of left shoulder, not elsewhere classified: Secondary | ICD-10-CM

## 2017-12-16 DIAGNOSIS — M25512 Pain in left shoulder: Principal | ICD-10-CM

## 2017-12-16 DIAGNOSIS — R293 Abnormal posture: Secondary | ICD-10-CM

## 2017-12-16 DIAGNOSIS — G8929 Other chronic pain: Secondary | ICD-10-CM

## 2017-12-16 DIAGNOSIS — R252 Cramp and spasm: Secondary | ICD-10-CM

## 2017-12-16 NOTE — Patient Instructions (Signed)
  Shoulder Blade Squeeze    Rotate shoulders back, then squeeze shoulder blades together. Repeat __10__ times. Do _several___ sessions per day.  Upper Back Strength: Lower Trapezius / Rotator Cuff " L's "     Arms in waitress pose, palms up. Press hands back and slide shoulder blades down. Hold for __5__ seconds. Repeat _10___ times. 1-2 times per day.    Scapular Retraction: Elbow Flexion (Standing)  "W's"     With elbows bent to 90, pinch shoulder blades together and rotate arms out, keeping elbows bent. Repeat __10__ times per set. Do __1-2__ sets per session. Do _several ___ sessions per day. ELBOW: Biceps - Standing    Standing in doorway or at counter top, place one hand on wall, elbow straight. STEP ONE FOOT FORWARD. Hold _15__ seconds. _3__ reps per set, _2_ sets per day, __7_ days per week   [place] [facility] [place] [address] [place], [place] [address]  [phone] (office) [phone] (fax)

## 2017-12-16 NOTE — Therapy (Signed)
[place], [place], [address] Phone: [phone]   Fax:  [phone]  Physical Therapy Treatment  Patient Details  Name: [name] MRN: [id number] Date of Birth: [birth year] Referring Provider: Candice Camp MD   Encounter Date: 12/16/2017  PT End of Session - 12/16/17 0934    Visit Number  5    Number of Visits  16    Date for PT Re-Evaluation  01/26/18    Authorization Type  CAFA  09/10/17 to 03/12/18    PT Start Time  0932    PT Stop Time  1015    PT Time Calculation (min)  43 min    Activity Tolerance  Patient tolerated treatment well;No increased pain    Behavior During Therapy  WFL for tasks assessed/performed       Past Medical History:  Diagnosis Date  . Anemia   . Cough   . Diverticulitis with perforation 2010  . DVT (deep vein thrombosis) in pregnancy ([place]) 11/24/2016  . DVT, lower extremity ([place])   . Endometrial cancer ([place])   . Family history of adverse reaction to anesthesia    pts daughter had difficulty awakening following anesthesia   . GERD (gastroesophageal reflux disease)   . Headache   . History of bronchitis   . History of ear infections   . Hx of migraines   . Hypertension   . IBS (irritable bowel syndrome)   . Kidney infection   . Lupus (systemic lupus erythematosus) ([place]) 02/2017  . Morbid obesity ([place])   . Ovarian cancer ([place]) dx'd 01/2015  . Pyelonephritis     Past Surgical History:  Procedure Laterality Date  . ABDOMINAL HYSTERECTOMY N/A 03/13/2015   Procedure: TOTAL HYSTERECTOMY ABDOMINAL BILATERAL SALPINGO OOPHORECTOMY RADICAL TUMOR [place];  Surgeon: Everitt Amber, MD;  [facility];  Service: Gynecology;  Laterality: N/A;  . BOWEL RESECTION  03/13/2015   Procedure: SMALL BOWEL RESECTION;  Surgeon: Everitt Amber, MD;  Location: [facility];  Service: Gynecology;;  . [place] and 1984  . COLOSTOMY  2008  . COLOSTOMY TAKEDOWN    . LAPAROTOMY N/A  03/13/2015   Procedure: EXPLORATORY LAPAROTOMY;  Surgeon: Everitt Amber, MD;  [facility];  Service: Gynecology;  Laterality: N/A;  . TONSILLECTOMY AND ADENOIDECTOMY  1997  . VENTRAL HERNIA REPAIR  03/13/2015   Procedure: HERNIA REPAIR VENTRAL ADULT;  Surgeon: Everitt Amber, MD;  [facility];  Service: Gynecology;;    There were no vitals filed for this visit.  Subjective Assessment - 12/16/17 0934    Subjective  Pt reports her Lt hand and shoulder were worse last night. She didn't sleep well last night.  The hot shower helped ease the pain a little.     Currently in Pain?  Yes    Pain Score  6     Pain Location  Shoulder    Pain Orientation  Left    Pain Descriptors / Indicators  Sore    Aggravating Factors   moving arm to Rt. Sleeping on it.  Reaching.     Pain Relieving Factors  ice/ tape          [facility] PT Assessment - 12/16/17 0001      Assessment   Medical Diagnosis  left chronic shoulder pain    Referring Provider  Candice Camp MD    Onset Date/Surgical Date  --   pain in left shld for about a year  Hand Dominance  Right    Next MD Visit  not scheduled yet.       AROM   Left Shoulder Flexion  101 Degrees   with pain, seated   Left Shoulder ABduction  104 Degrees   seated       [facility] Adult PT Treatment/Exercise - 12/16/17 0001      Self-Care   Other Self-Care Comments   Pt educated on sleeping with pillow under Lt arm for support, and educated on self massage to Lt pec to decrease fascial tightness and pain. Pt returned demo with cues.       Shoulder Exercises: Seated   Other Seated Exercises  L's and W's (per HEP) x 10 reps with 3-5 sec hold - multiple cues for posture and scap retraction;    scap squeeze x 5 sec hold x 10     Other Seated Exercises  shoulder rolls x 10       Shoulder Exercises: Pulleys   Flexion  3 minutes    Scaption  3 minutes      Shoulder Exercises: Stretch   Other Shoulder Stretches  unilateral mid-level doorway stretch x 15 sec  x 2 (mulitple cues for form)     Other Shoulder Stretches  bilat shoulder ext with cane behind back x 10 reps;  Lt shoulder ext stretch holding end of counter x 15 sec x 3 reps, 2 sets      Manual Therapy   Manual Therapy  Taping;Soft tissue mobilization    Manual therapy comments  I strip of [place] applied to post/ ant Lt deltoid with 20% stretch, perpedicular strip applied with 50% stretch to L prox bicep tendon ( for added pain relief).     Soft tissue mobilization  light IASTM and STM to Lt prox shoulder to decrease fascial restrictions and improve ROM             PT Education - 12/16/17 1003    Education Details  HEP    Person(s) Educated  Patient    Methods  Explanation;Handout    Comprehension  Verbalized understanding       PT Short Term Goals - 12/16/17 1014      PT SHORT TERM GOAL #1   Title  Pt will be independent with initial HEP    Time  3    Period  Weeks    Status  On-going      PT SHORT TERM GOAL #2   Title  Pt will be able to use UE to increase AROM to at least 100 degrees flexion in order to lift items in kitch to shelf at shoulder height    Time  3    Period  Weeks    Status  Achieved      PT SHORT TERM GOAL #3   Title  "Demonstrate understanding of proper sitting posture, body mechanics, work ergonomics, and be more conscious of position and posture throughout the day.     Time  3    Period  Weeks    Status  On-going        PT Long Term Goals - 12/01/17 1246      PT LONG TERM GOAL #1   Title  "Pt will be independent with advanced HEP.     Time  8    Period  Weeks    Status  New    Target Date  01/26/18      PT LONG TERM GOAL #2  Title  left  shoulder IR and ER will return to [facility] to return to pain-free ADLs such as dressing and grooming.     Time  8    Period  Weeks    Status  New    Target Date  01/26/18      PT LONG TERM GOAL #3   Title  "FOTO will improve from  66%limitation   to  37% limitation    indicating improved functional  mobility .     Time  6    Period  Weeks    Status  New    Target Date  01/26/18      PT LONG TERM GOAL #4   Title  left shoulder AROM scaption will improve to 0-150degrees for improved overhead reaching.     Time  8    Period  Weeks    Status  New    Target Date  01/26/18      PT LONG TERM GOAL #5   Title  Pt will be able to sleep on left side at night with 50% greater comfort for more restorative sleep at night    Time  8    Period  Weeks    Status  New    Target Date  01/26/18            Plan - 12/16/17 0942    Clinical Impression Statement  Pt tolerated pulleys well; able to flex Lt shoulder up to 130 deg without increase in pain.  Her AROM has improved since initial eval.  She continues to report pain relief/ benefit from [place] application.  Pt progressing towards goals.     Rehab Potential  Good    PT Frequency  2x / week    PT Duration  6 weeks    PT Treatment/Interventions  ADLs/Self Care Home Management;Cryotherapy;Electrical Stimulation;Iontophoresis 4mg /ml Dexamethasone;Moist Heat;Ultrasound;Therapeutic exercise;Therapeutic activities;Neuromuscular re-education;Patient/family education;Manual techniques;Passive range of motion;Dry needling;Taping    PT Next Visit Plan  assess respone to new exercises.        Patient will benefit from skilled therapeutic intervention in order to improve the following deficits and impairments:  Pain, Postural dysfunction, Improper body mechanics, Decreased range of motion, Decreased strength, Increased fascial restricitons, Increased muscle spasms, Impaired flexibility, Impaired UE functional use  Visit Diagnosis: Chronic left shoulder pain  Stiffness of left shoulder, not elsewhere classified  Abnormal posture  Cramp and spasm     Problem List Patient Active Problem List   Diagnosis Date Noted  . Left shoulder pain 11/11/2017  . Viral URI with cough 10/09/2017  . Medication refill 10/09/2017  . Enlarged lymph nodes  08/13/2017  . Cramping of hands 12/31/2016  . Fatigue 11/13/2016  . Elevated antinuclear antibody (ANA) level 09/23/2016  . Eczema 01/27/2016  . Portacath in place 09/12/2015  . Long term current use of anticoagulant therapy 09/06/2015  . Slow rate of speech 08/22/2015  . Chemotherapy-induced peripheral neuropathy ([place]) 06/13/2015  . CKD (chronic kidney disease) stage 3, GFR 30-59 ml/min (HCC) 05/12/2015  . Esophageal reflux 05/12/2015  . Ovarian carcinosarcoma, right ([place]) 03/29/2015  . Endometrial ca ([place]) 03/29/2015  . Ventral hernia without obstruction or gangrene   . Morbid obesity with BMI of 60.0-69.9, adult ([place]) 02/19/2015  . Essential hypertension, benign   . Stress at home 12/14/2014  . Left knee pain 08/04/2013  . Seasonal allergies 07/29/2013  . Right knee pain 11/28/2011   Kerin Perna, PTA 12/16/17 12:16 PM  [facility]  [place], [place], [address] Phone: [phone]   Fax:  [phone]  Name: AHMIRA BOISSELLE MRN: [id number] Date of Birth: 05/17/1965

## 2017-12-17 ENCOUNTER — Ambulatory Visit (HOSPITAL_COMMUNITY)
Admission: RE | Admit: 2017-12-17 | Discharge: 2017-12-17 | Disposition: A | Discharge status: Home / Self Care | Payer: Self-pay | Source: Ambulatory Visit | Attending: Obstetrics and Gynecology | Admitting: Obstetrics and Gynecology

## 2017-12-17 ENCOUNTER — Ambulatory Visit
Admission: RE | Admit: 2017-12-17 | Discharge: 2017-12-17 | Disposition: A | Discharge status: Home / Self Care | Payer: No Typology Code available for payment source | Source: Ambulatory Visit | Attending: Obstetrics and Gynecology | Admitting: Obstetrics and Gynecology

## 2017-12-17 ENCOUNTER — Other Ambulatory Visit (HOSPITAL_COMMUNITY): Payer: Self-pay | Admitting: *Deleted

## 2017-12-17 ENCOUNTER — Encounter (HOSPITAL_COMMUNITY): Payer: Self-pay

## 2017-12-17 VITALS — BP 126/80 | Ht 65.5 in | Wt 372.4 lb

## 2017-12-17 DIAGNOSIS — N644 Mastodynia: Secondary | ICD-10-CM

## 2017-12-17 DIAGNOSIS — M79622 Pain in left upper arm: Secondary | ICD-10-CM

## 2017-12-17 DIAGNOSIS — Z1231 Encounter for screening mammogram for malignant neoplasm of breast: Secondary | ICD-10-CM

## 2017-12-17 DIAGNOSIS — Z1239 Encounter for other screening for malignant neoplasm of breast: Secondary | ICD-10-CM

## 2017-12-17 NOTE — Patient Instructions (Signed)
Explained breast self awareness with [name]. Patient did not need a Pap smear today due to last Pap smear and HPV typing was 12/12/2014. Patient is being followed by Dr. Denman George at the [facility]. Referred patient to the [facility] for a diagnostic mammogram. Appointment scheduled for Thursday, December 17, 2017 at 0930. [name] verbalized understanding.  Iris Tatsch, [place], RN 10:02 AM

## 2017-12-17 NOTE — Progress Notes (Signed)
Complaints of left axillary and outer breast pain x one month that comes and goes. Patient rates the pain at a 6 out of 10.  Pap Smear: Pap smear not completed today. Last Pap smear was 12/12/2014 at [facility] and normal with negative HPV. Per patient has no history of an abnormal Pap smear. Patient has a history of a hysterectomy 03/13/2015 due to ovarian and endometrial cancer per patient. Patient is being followed by Dr. Denman George at the [facility]. Her next appointment is 03/19/2018 and patient stated she is scheduled to have a Pap smear at that appointment. Last Pap smear and hysterectomy results are in Epic.  Physical exam: Breasts Breasts symmetrical. No skin abnormalities bilateral breasts. No nipple retraction bilateral breasts. No nipple discharge bilateral breasts. No lymphadenopathy. No lumps palpated bilateral breasts. Complaints of left axillary pain and mild left outer breast tenderness on exam. Referred patient to the [facility] for a diagnostic mammogram. Appointment scheduled for Thursday, December 17, 2017 at 0930.        Pelvic/Bimanual No Pap smear completed today since last Pap smear and HPV typing was 12/12/2014.   Smoking History: Patient has never smoked.  Patient Navigation: Patient education provided. Access to services provided for patient through [facility] program.   Colorectal Cancer Screening: Per patient had a colonoscopy completed 3 years ago. No complaints today. FIT Test given to patient to complete and return to [facility].  Breast and Cervical Cancer Risk Assessment: Patient has no family history of breast cancer, known genetic mutations, or radiation treatment to the chest before age 71. Patient has no history of cervical dysplasia, immunocompromised, or DES exposure in-utero.  Risk Assessment    Risk Scores      12/17/2017   Last edited by: Armond Hang, LPN   5-year risk: 1.3 %   Lifetime risk: 8.2 %

## 2017-12-18 ENCOUNTER — Encounter (HOSPITAL_COMMUNITY): Payer: Self-pay | Admitting: *Deleted

## 2017-12-22 ENCOUNTER — Ambulatory Visit: Payer: Self-pay | Admitting: Physical Therapy

## 2017-12-22 ENCOUNTER — Encounter: Payer: Self-pay | Admitting: Physical Therapy

## 2017-12-22 DIAGNOSIS — G8929 Other chronic pain: Secondary | ICD-10-CM

## 2017-12-22 DIAGNOSIS — M25612 Stiffness of left shoulder, not elsewhere classified: Secondary | ICD-10-CM

## 2017-12-22 DIAGNOSIS — R252 Cramp and spasm: Secondary | ICD-10-CM

## 2017-12-22 DIAGNOSIS — R293 Abnormal posture: Secondary | ICD-10-CM

## 2017-12-22 DIAGNOSIS — M25512 Pain in left shoulder: Principal | ICD-10-CM

## 2017-12-22 NOTE — Therapy (Signed)
[place], [place], [address] Phone: [phone]   Fax:  [phone]  Physical Therapy Treatment  Patient Details  Name: [name] MRN: [id number] Date of Birth: 1966/01/24 Referring Provider: Candice Camp MD   Encounter Date: 12/22/2017  PT End of Session - 12/22/17 1008    Visit Number  6    Number of Visits  16    Date for PT Re-Evaluation  01/26/18    Authorization Type  CAFA  09/10/17 to 03/12/18    PT Start Time  0934       Past Medical History:  Diagnosis Date  . Anemia   . Cough   . Diverticulitis with perforation 2010  . DVT (deep vein thrombosis) in pregnancy ([place]) 11/24/2016  . DVT, lower extremity ([place])   . Endometrial cancer ([place])   . Family history of adverse reaction to anesthesia    pts daughter had difficulty awakening following anesthesia   . GERD (gastroesophageal reflux disease)   . Headache   . History of bronchitis   . History of ear infections   . Hx of migraines   . Hypertension   . IBS (irritable bowel syndrome)   . Kidney infection   . Lupus (systemic lupus erythematosus) ([place]) 02/2017  . Morbid obesity ([place])   . Ovarian cancer ([place]) dx'd 01/2015  . Pyelonephritis     Past Surgical History:  Procedure Laterality Date  . ABDOMINAL HYSTERECTOMY N/A 03/13/2015   Procedure: TOTAL HYSTERECTOMY ABDOMINAL BILATERAL SALPINGO OOPHORECTOMY RADICAL TUMOR [place];  Surgeon: Everitt Amber, MD;  [facility];  Service: Gynecology;  Laterality: N/A;  . BOWEL RESECTION  03/13/2015   Procedure: SMALL BOWEL RESECTION;  Surgeon: Everitt Amber, MD;  Location: [facility];  Service: Gynecology;;  . [place] and 1984  . COLOSTOMY  2008  . COLOSTOMY TAKEDOWN    . LAPAROTOMY N/A 03/13/2015   Procedure: EXPLORATORY LAPAROTOMY;  Surgeon: Everitt Amber, MD;  [facility];  Service: Gynecology;  Laterality: N/A;  . TONSILLECTOMY AND ADENOIDECTOMY  1997  . VENTRAL HERNIA  REPAIR  03/13/2015   Procedure: HERNIA REPAIR VENTRAL ADULT;  Surgeon: Everitt Amber, MD;  [facility];  Service: Gynecology;;    There were no vitals filed for this visit.  Subjective Assessment - 12/22/17 0937    Subjective  my shoulder is tender to touch but I feel like I can move it ok. last week I was at grocery store and I reached for something on top shelf  and it hurt    Pertinent History  chronic kidney dz(Stage 3)  ovarian stage 3/ endometrial , recently with SLE, uses a cane due to right meniscus, obesity, See extensive medical history    Limitations  Lifting;House hold activities    How long can you sit comfortably?  unlimited    How long can you stand comfortably?  unlimted    How long can you walk comfortably?  unlimited    Diagnostic tests  ultrasound dx    Patient Stated Goals  I would like avoid surgery, I want to be able to put on my bra and comb my hair and sleep on my left side , ADL's    Currently in Pain?  Yes    Pain Score  5     Pain Location  Shoulder    Pain Orientation  Left    Pain Descriptors / Indicators  Sore    Pain Type  Chronic pain    Pain Onset  More than a month ago    Pain Frequency  Intermittent    Aggravating Factors   lifting my arm above my shoulder         [facility] PT Assessment - 12/22/17 0001      AROM   Left Shoulder Flexion  119 Degrees   with pain, seated   Left Shoulder ABduction  105 Degrees   seated                  [facility] Adult PT Treatment/Exercise - 12/22/17 1002      Therapeutic Activites    Therapeutic Activities  Other Therapeutic Activities    Other Therapeutic Activities  worked on lifting left arm to 2nd and top shelf without exacerbating pain x 5.  Pt with pain on 5th try on 3rd shelf   done after pulley warm up      Shoulder Exercises: Supine   Other Supine Exercises  perturbations with arm outstretched       Shoulder Exercises: Seated   Other Seated Exercises  L's and W's (per HEP) x 10 reps with  3-5 sec hold - multiple cues for posture and scap retraction;    scap squeeze x 5 sec hold x 10  and cane ER isometric stretch x 10 for 5 sec hold in sitting    Other Seated Exercises  shoulder rolls x 10  15 x 2 lat pulls bil with red t band,       Shoulder Exercises: Pulleys   Flexion  3 minutes    Scaption  3 minutes      Modalities   Modalities  Iontophoresis      Iontophoresis   Type of Iontophoresis  Dexamethasone    Location  left shoulder ant and over brachoradilalis left only    Dose  1ccx 2 patches    Time  use of patches x 2       Manual Therapy   Soft tissue mobilization   STM to Lt prox shoulder to decrease fascial restrictions and improve ROM             PT Education - 12/22/17 1001    Education Details  Iontophoresis educationand added to HEP     Person(s) Educated  Patient    Methods  Explanation;Demonstration;Tactile cues;Verbal cues;Handout    Comprehension  Verbalized understanding;Returned demonstration       PT Short Term Goals - 12/22/17 1317      PT SHORT TERM GOAL #1   Title  Pt will be independent with initial HEP    Time  3    Period  Weeks    Status  Achieved      PT SHORT TERM GOAL #2   Title  Pt will be able to use UE to increase AROM to at least 100 degrees flexion in order to lift items in kitch to shelf at shoulder height    Baseline  119 12-22-17    Time  3    Period  Weeks    Status  Achieved      PT SHORT TERM GOAL #3   Title  "Demonstrate understanding of proper sitting posture, body mechanics, work ergonomics, and be more conscious of position and posture throughout the day.     Baseline  demonstrates more awareness in clinic    Time  3    Period  Weeks    Status  Achieved  PT Long Term Goals - 12/22/17 1317      PT LONG TERM GOAL #1   Title  "Pt will be independent with advanced HEP.     Baseline  working on resisted and advanced HEP    Time  8    Period  Weeks    Status  On-going      PT LONG TERM GOAL #2    Title  left  shoulder IR and ER will return to [facility] to return to pain-free ADLs such as dressing and grooming.     Time  8    Period  Weeks    Status  On-going      PT LONG TERM GOAL #3   Title  "FOTO will improve from  66%limitation   to  37% limitation    indicating improved functional mobility .     Time  6    Period  Weeks    Status  Unable to assess      PT LONG TERM GOAL #4   Title  left shoulder AROM scaption will improve to 0-150degrees for improved overhead reaching.     Baseline  left shoulder now 119 flexion    Time  8    Period  Weeks    Status  On-going      PT LONG TERM GOAL #5   Title  Pt will be able to sleep on left side at night with 50% greater comfort for more restorative sleep at night    Baseline  Pt able to sleep on left side but still with TTP pain on anterior left shoulder    Time  8    Period  Weeks    Status  On-going            Plan - 12/22/17 1313    Clinical Impression Statement  Pt tolerating more motion with flexion on left to 119 and pt able to sleep better.  Pt still with TTP light touch over brachioradilais and over anterior shoulder over biciptial groove.  Pt consented to  iontophoresis for pain control . Pt makeing progress toward goals.  All STG's achieved.  Moving onto LTG completion    Rehab Potential  Good    PT Frequency  2x / week    PT Duration  6 weeks    PT Treatment/Interventions  ADLs/Self Care Home Management;Cryotherapy;Electrical Stimulation;Iontophoresis 4mg /ml Dexamethasone;Moist Heat;Ultrasound;Therapeutic exercise;Therapeutic activities;Neuromuscular re-education;Patient/family education;Manual techniques;Passive range of motion;Dry needling;Taping    PT Next Visit Plan  assess respone to new exercises.  and iontophoresis    PT Home Exercise Plan  supine cane flexion and ER , Table slides.  lat pull downs and Bil ER    Consulted and Agree with Plan of Care  Patient       Patient will benefit from skilled therapeutic  intervention in order to improve the following deficits and impairments:  Pain, Postural dysfunction, Improper body mechanics, Decreased range of motion, Decreased strength, Increased fascial restricitons, Increased muscle spasms, Impaired flexibility, Impaired UE functional use  Visit Diagnosis: Chronic left shoulder pain  Stiffness of left shoulder, not elsewhere classified  Abnormal posture  Cramp and spasm     Problem List Patient Active Problem List   Diagnosis Date Noted  . Left shoulder pain 11/11/2017  . Viral URI with cough 10/09/2017  . Medication refill 10/09/2017  . Enlarged lymph nodes 08/13/2017  . Cramping of hands 12/31/2016  . Fatigue 11/13/2016  . Elevated antinuclear antibody (ANA) level 09/23/2016  .  Eczema 01/27/2016  . Portacath in place 09/12/2015  . Long term current use of anticoagulant therapy 09/06/2015  . Slow rate of speech 08/22/2015  . Chemotherapy-induced peripheral neuropathy ([place]) 06/13/2015  . CKD (chronic kidney disease) stage 3, GFR 30-59 ml/min (HCC) 05/12/2015  . Esophageal reflux 05/12/2015  . Ovarian carcinosarcoma, right ([place]) 03/29/2015  . Endometrial ca ([place]) 03/29/2015  . Ventral hernia without obstruction or gangrene   . Morbid obesity with BMI of 60.0-69.9, adult ([place]) 02/19/2015  . Essential hypertension, benign   . Stress at home 12/14/2014  . Left knee pain 08/04/2013  . Seasonal allergies 07/29/2013  . Right knee pain 11/28/2011   Voncille Lo, PT Certified Exercise Expert for the Aging Adult  12/22/17 1:22 PM Phone: [phone] Fax: [facility] [address] [place], [place], [address] Phone: [phone]   Fax:  [phone]  Name: [name] MRN: [id number] Date of Birth: September 07, 1965

## 2017-12-22 NOTE — Patient Instructions (Addendum)

## 2017-12-24 ENCOUNTER — Ambulatory Visit: Payer: Self-pay | Admitting: Physical Therapy

## 2017-12-24 ENCOUNTER — Encounter: Payer: Self-pay | Admitting: Physical Therapy

## 2017-12-24 DIAGNOSIS — M25612 Stiffness of left shoulder, not elsewhere classified: Secondary | ICD-10-CM

## 2017-12-24 DIAGNOSIS — G8929 Other chronic pain: Secondary | ICD-10-CM

## 2017-12-24 DIAGNOSIS — M25512 Pain in left shoulder: Principal | ICD-10-CM

## 2017-12-24 DIAGNOSIS — R293 Abnormal posture: Secondary | ICD-10-CM

## 2017-12-24 DIAGNOSIS — R252 Cramp and spasm: Secondary | ICD-10-CM

## 2017-12-24 NOTE — Patient Instructions (Addendum)
Over Head Pull: Narrow Grip       On back, knees bent, feet flat, band across thighs, elbows straight but relaxed. Pull hands apart (start). Keeping elbows straight, bring arms up and over head, hands toward floor. Keep pull steady on band. Hold momentarily. Return slowly, keeping pull steady, back to start. Repeat _15__ times. Band color _yellow_____   Side Pull: Double Arm   On back, knees bent, feet flat. Arms perpendicular to body, shoulder level, elbows straight but relaxed. Pull arms out to sides, elbows straight. Resistance band comes across collarbones, hands toward floor. Hold momentarily. Slowly return to starting position. Repeat _15__ times. Band color yellow_____   Sash   On back, knees bent, feet flat, left hand on left hip, right hand above left. Pull right arm DIAGONALLY (hip to shoulder) across chest. Bring right arm along head toward floor. Hold momentarily. Slowly return to starting position. Repeat _15__ times. Do with left arm. Band color __yellow____   Shoulder Rotation: Double Arm   On back, knees bent, feet flat, elbows tucked at sides, bent 90, hands palms up. Pull hands apart and down toward floor, keeping elbows near sides. Hold momentarily. Slowly return to starting position. Repeat _15__ times. Band color _yellow_____   MEDIAN NERVE: Flossing I    With right elbow bent and palm facing up as if holding a tray, head tilted away. Simultaneously straighten arm and tilt head toward involved shoulder. Do __1_ sets of __10_ repetitions per session. Do __2_ sessions per day.   Trigger Point Dry Needling  . What is Trigger Point Dry Needling (DN)? o DN is a physical therapy technique used to treat muscle pain and dysfunction. Specifically, DN helps deactivate muscle trigger points (muscle knots).  o A thin filiform needle is used to penetrate the skin and stimulate the underlying trigger point. The goal is for a local twitch response (LTR) to occur and for the  trigger point to relax. No medication of any kind is injected during the procedure.   . What Does Trigger Point Dry Needling Feel Like?  o The procedure feels different for each individual patient. Some patients report that they do not actually feel the needle enter the skin and overall the process is not painful. Very mild bleeding may occur. However, many patients feel a deep cramping in the muscle in which the needle was inserted. This is the local twitch response.   Marland Kitchen How Will I feel after the treatment? o Soreness is normal, and the onset of soreness may not occur for a few hours. Typically this soreness does not last longer than two days.  o Bruising is uncommon, however; ice can be used to decrease any possible bruising.  o In rare cases feeling tired or nauseous after the treatment is normal. In addition, your symptoms may get worse before they get better, this period will typically not last longer than 24 hours.   . What Can I do After My Treatment? o Increase your hydration by drinking more water for the next 24 hours. o You may place ice or heat on the areas treated that have become sore, however, do not use heat on inflamed or bruised areas. Heat often brings more relief post needling. o You can continue your regular activities, but vigorous activity is not recommended initially after the treatment for 24 hours. o DN is best combined with other physical therapy such as strengthening, stretching, and other therapies.   Copyright  VHI. All rights reserved.  Voncille Lo, PT Certified Exercise Expert for the Aging Adult  12/24/17 10:36 AM Phone: [phone] Fax: [phone]

## 2017-12-24 NOTE — Therapy (Signed)
[place], [place], [address] Phone: [phone]   Fax:  [phone]  Physical Therapy Treatment  Patient Details  Name: [name] MRN: [id number] Date of Birth: Aug 03, 1965 Referring Provider: Candice Camp MD   Encounter Date: 12/24/2017  PT End of Session - 12/24/17 1028    Visit Number  7    Number of Visits  16    Date for PT Re-Evaluation  01/26/18    Authorization Type  CAFA  09/10/17 to 03/12/18    PT Start Time  1022    PT Stop Time  1105    PT Time Calculation (min)  43 min    Activity Tolerance  Patient tolerated treatment well;No increased pain    Behavior During Therapy  WFL for tasks assessed/performed       Past Medical History:  Diagnosis Date  . Anemia   . Cough   . Diverticulitis with perforation 2010  . DVT (deep vein thrombosis) in pregnancy ([place]) 11/24/2016  . DVT, lower extremity ([place])   . Endometrial cancer ([place])   . Family history of adverse reaction to anesthesia    pts daughter had difficulty awakening following anesthesia   . GERD (gastroesophageal reflux disease)   . Headache   . History of bronchitis   . History of ear infections   . Hx of migraines   . Hypertension   . IBS (irritable bowel syndrome)   . Kidney infection   . Lupus (systemic lupus erythematosus) ([place]) 02/2017  . Morbid obesity ([place])   . Ovarian cancer ([place]) dx'd 01/2015  . Pyelonephritis     Past Surgical History:  Procedure Laterality Date  . ABDOMINAL HYSTERECTOMY N/A 03/13/2015   Procedure: TOTAL HYSTERECTOMY ABDOMINAL BILATERAL SALPINGO OOPHORECTOMY RADICAL TUMOR [place];  Surgeon: Everitt Amber, MD;  Location: [facility];  Service: Gynecology;  Laterality: N/A;  . BOWEL RESECTION  03/13/2015   Procedure: SMALL BOWEL RESECTION;  Surgeon: Everitt Amber, MD;  Location: [facility];  Service: Gynecology;;  . [place] and 1984  . COLOSTOMY  2008  . COLOSTOMY TAKEDOWN    . LAPAROTOMY N/A  03/13/2015   Procedure: EXPLORATORY LAPAROTOMY;  Surgeon: Everitt Amber, MD;  [facility];  Service: Gynecology;  Laterality: N/A;  . TONSILLECTOMY AND ADENOIDECTOMY  1997  . VENTRAL HERNIA REPAIR  03/13/2015   Procedure: HERNIA REPAIR VENTRAL ADULT;  Surgeon: Everitt Amber, MD;  [facility];  Service: Gynecology;;    There were no vitals filed for this visit.  Subjective Assessment - 12/24/17 1025    Subjective  My left shoulder is still tender to touch even though we used the ionto patches tuesday. I did not see any difference.  in my shower I have 2 levels of bars in my shower.  i do my shoulder extension exercise. I mostly have pain at night and I sometimes use ice and feel better.  I feel like crying because my right knee and left shoulder really hurt and I am going to Dr Mingo Amber for my knee on Oct 10    Pertinent History  chronic kidney dz(Stage 3)  ovarian stage 3/ endometrial , recently with SLE, uses a cane due to right meniscus, obesity, See extensive medical history    Limitations  Lifting;House hold activities    How long can you sit comfortably?  unlimited    How long can you stand comfortably?  unlimted    How long can you walk  comfortably?  unlimited    Diagnostic tests  ultrasound dx    Patient Stated Goals  I would like avoid surgery, I want to be able to put on my bra and comb my hair and sleep on my left side , ADL's    Currently in Pain?  Yes    Pain Score  7     Pain Location  Shoulder    Pain Orientation  Left    Pain Descriptors / Indicators  Sore    Pain Type  Chronic pain    Pain Onset  More than a month ago    Pain Frequency  Intermittent                       [facility] Adult PT Treatment/Exercise - 12/24/17 1103      Self-Care   Other Self-Care Comments   education on TPDN aftercare and precautians      Shoulder Exercises: Supine   Other Supine Exercises  perturbations with arm outstretched     Other Supine Exercises  supine scapular  stabilizers with yellow t band with bil flex, horizontal abd and diagonals and bil ER with yellow t band  pt with shoulder flare and could not tolerate more today.      Shoulder Exercises: Seated   Diagonals Limitations  MEdian nerve glide left x 10 and median, ulnar and radial nerve combined nmaneuver x 5    Other Seated Exercises  shoulder rolls x 10  15 x 2 lat pulls bil with red t band,       Manual Therapy   Manual therapy comments  skilled palpation with TPDN     Soft tissue mobilization  STM left shoulder girdle muscles especially biceps and brachioradialis on left only       Trigger Point Dry Needling - 12/24/17 1106    Consent Given?  Yes    Education Handout Provided  Yes    Muscles Treated Upper Body  --   biceps and brachioradilalis left only with decreased in pain   Muscles Treated Lower Body  --   twitch response noted in left biceps           PT Education - 12/24/17 1036    Education Details  added to HEP for supine scapular stabilizers for an inflamed day with yellow t band,  median nerve glide and education onTPDN    Person(s) Educated  Patient    Methods  Explanation;Demonstration;Tactile cues;Verbal cues;Handout    Comprehension  Verbalized understanding;Returned demonstration       PT Short Term Goals - 12/22/17 1317      PT SHORT TERM GOAL #1   Title  Pt will be independent with initial HEP    Time  3    Period  Weeks    Status  Achieved      PT SHORT TERM GOAL #2   Title  Pt will be able to use UE to increase AROM to at least 100 degrees flexion in order to lift items in kitch to shelf at shoulder height    Baseline  119 12-22-17    Time  3    Period  Weeks    Status  Achieved      PT SHORT TERM GOAL #3   Title  "Demonstrate understanding of proper sitting posture, body mechanics, work ergonomics, and be more conscious of position and posture throughout the day.     Baseline  demonstrates more awareness in  clinic    Time  3    Period  Weeks     Status  Achieved        PT Long Term Goals - 12/22/17 1317      PT LONG TERM GOAL #1   Title  "Pt will be independent with advanced HEP.     Baseline  working on resisted and advanced HEP    Time  8    Period  Weeks    Status  On-going      PT LONG TERM GOAL #2   Title  left  shoulder IR and ER will return to [facility] to return to pain-free ADLs such as dressing and grooming.     Time  8    Period  Weeks    Status  On-going      PT LONG TERM GOAL #3   Title  "FOTO will improve from  66%limitation   to  37% limitation    indicating improved functional mobility .     Time  6    Period  Weeks    Status  Unable to assess      PT LONG TERM GOAL #4   Title  left shoulder AROM scaption will improve to 0-150degrees for improved overhead reaching.     Baseline  left shoulder now 119 flexion    Time  8    Period  Weeks    Status  On-going      PT LONG TERM GOAL #5   Title  Pt will be able to sleep on left side at night with 50% greater comfort for more restorative sleep at night    Baseline  Pt able to sleep on left side but still with TTP pain on anterior left shoulder    Time  8    Period  Weeks    Status  On-going            Plan - 12/24/17 1027    Clinical Impression Statement  PT came into clinic at the point of tears and discouraged due to pain at 7/10.  Pt reports that iontophoresis was not helpful and she is very discouraged about her right knee as well.  Pt consented to TPDN and was closely monitored for TTP of left biceps and brachioradilalis.  Pt reports pain decrease from  7/10 to 5/10 after RX.  She refused  moist heat and deferred to use at home instead.  Pt educated and demonstrated nerve glides, median and combined sitting median, radial and ulnar nerve glide on left    Rehab Potential  Good    PT Frequency  2x / week    PT Duration  6 weeks    PT Treatment/Interventions  ADLs/Self Care Home Management;Cryotherapy;Electrical Stimulation;Iontophoresis 4mg /ml  Dexamethasone;Moist Heat;Ultrasound;Therapeutic exercise;Therapeutic activities;Neuromuscular re-education;Patient/family education;Manual techniques;Passive range of motion;Dry needling;Taping    PT Next Visit Plan  tried ionto one time, no difference.  Pt likes tape. Pt responded well to TPDN to brachioradialis/ bicep proximal end over tender area.  Progress shoulder as able.     PT Home Exercise Plan  supine cane flexion and ER , Table slides.  lat pull downs and Bil ER supine scapular stabiizers    Consulted and Agree with Plan of Care  Patient       Patient will benefit from skilled therapeutic intervention in order to improve the following deficits and impairments:  Pain, Postural dysfunction, Improper body mechanics, Decreased range of motion, Decreased strength, Increased fascial restricitons,  Increased muscle spasms, Impaired flexibility, Impaired UE functional use  Visit Diagnosis: Chronic left shoulder pain  Stiffness of left shoulder, not elsewhere classified  Abnormal posture  Cramp and spasm     Problem List Patient Active Problem List   Diagnosis Date Noted  . Left shoulder pain 11/11/2017  . Viral URI with cough 10/09/2017  . Medication refill 10/09/2017  . Enlarged lymph nodes 08/13/2017  . Cramping of hands 12/31/2016  . Fatigue 11/13/2016  . Elevated antinuclear antibody (ANA) level 09/23/2016  . Eczema 01/27/2016  . Portacath in place 09/12/2015  . Long term current use of anticoagulant therapy 09/06/2015  . Slow rate of speech 08/22/2015  . Chemotherapy-induced peripheral neuropathy ([place]) 06/13/2015  . CKD (chronic kidney disease) stage 3, GFR 30-59 ml/min (H[facility]) 05/12/2015  . Esophageal reflux 05/12/2015  . Ovarian carcinosarcoma, right ([place]) 03/29/2015  . Endometrial ca ([place]) 03/29/2015  . Ventral hernia without obstruction or gangrene   . Morbid obesity with BMI of 60.0-69.9, adult ([place]) 02/19/2015  . Essential hypertension, benign   . Stress at home  12/14/2014  . Left knee pain 08/04/2013  . Seasonal allergies 07/29/2013  . Right knee pain 11/28/2011   Voncille Lo, PT Certified Exercise Expert for the Aging Adult  12/24/17 11:34 AM Phone: [phone] Fax: [facility] [address] [place], [place], [address] Phone: [phone]   Fax:  [phone]  Name: [name] MRN: [id number] Date of Birth: 11-30-1965

## 2017-12-25 ENCOUNTER — Other Ambulatory Visit: Payer: No Typology Code available for payment source

## 2017-12-25 ENCOUNTER — Other Ambulatory Visit (HOSPITAL_COMMUNITY): Payer: Self-pay | Admitting: *Deleted

## 2017-12-25 ENCOUNTER — Ambulatory Visit: Payer: Medicaid Other

## 2017-12-25 DIAGNOSIS — Z0000 Encounter for general adult medical examination without abnormal findings: Secondary | ICD-10-CM

## 2017-12-25 NOTE — Progress Notes (Signed)
lipid

## 2017-12-28 ENCOUNTER — Ambulatory Visit: Payer: Self-pay | Admitting: Physical Therapy

## 2017-12-28 ENCOUNTER — Encounter: Payer: Self-pay | Admitting: Physical Therapy

## 2017-12-28 DIAGNOSIS — M25512 Pain in left shoulder: Principal | ICD-10-CM

## 2017-12-28 DIAGNOSIS — R252 Cramp and spasm: Secondary | ICD-10-CM

## 2017-12-28 DIAGNOSIS — G8929 Other chronic pain: Secondary | ICD-10-CM

## 2017-12-28 DIAGNOSIS — R293 Abnormal posture: Secondary | ICD-10-CM

## 2017-12-28 DIAGNOSIS — M25612 Stiffness of left shoulder, not elsewhere classified: Secondary | ICD-10-CM

## 2017-12-28 NOTE — Therapy (Signed)
[place], [place], [address] Phone: [phone]   Fax:  [phone]  Physical Therapy Treatment  Patient Details  Name: [name] MRN: [id number] Date of Birth: 1965/11/22 Referring Provider: Candice Camp MD   Encounter Date: 12/28/2017  PT End of Session - 12/28/17 1130    Visit Number  8    Number of Visits  16    Date for PT Re-Evaluation  01/26/18    PT Start Time  0932    PT Stop Time  1015    PT Time Calculation (min)  43 min    Activity Tolerance  Patient tolerated treatment well    Behavior During Therapy  [facility] for tasks assessed/performed       Past Medical History:  Diagnosis Date  . Anemia   . Cough   . Diverticulitis with perforation 2010  . DVT (deep vein thrombosis) in pregnancy ([place]) 11/24/2016  . DVT, lower extremity ([place])   . Endometrial cancer ([place])   . Family history of adverse reaction to anesthesia    pts daughter had difficulty awakening following anesthesia   . GERD (gastroesophageal reflux disease)   . Headache   . History of bronchitis   . History of ear infections   . Hx of migraines   . Hypertension   . IBS (irritable bowel syndrome)   . Kidney infection   . Lupus (systemic lupus erythematosus) ([place]) 02/2017  . Morbid obesity ([place])   . Ovarian cancer ([place]) dx'd 01/2015  . Pyelonephritis     Past Surgical History:  Procedure Laterality Date  . ABDOMINAL HYSTERECTOMY N/A 03/13/2015   Procedure: TOTAL HYSTERECTOMY ABDOMINAL BILATERAL SALPINGO OOPHORECTOMY RADICAL TUMOR [place];  Surgeon: Everitt Amber, MD;  [facility];  Service: Gynecology;  Laterality: N/A;  . BOWEL RESECTION  03/13/2015   Procedure: SMALL BOWEL RESECTION;  Surgeon: Everitt Amber, MD;  Location: [facility];  Service: Gynecology;;  . [place] and 1984  . COLOSTOMY  2008  . COLOSTOMY TAKEDOWN    . LAPAROTOMY N/A 03/13/2015   Procedure: EXPLORATORY LAPAROTOMY;  Surgeon: Everitt Amber, MD;  [facility];  Service: Gynecology;  Laterality: N/A;  . TONSILLECTOMY AND ADENOIDECTOMY  1997  . VENTRAL HERNIA REPAIR  03/13/2015   Procedure: HERNIA REPAIR VENTRAL ADULT;  Surgeon: Everitt Amber, MD;  [facility];  Service: Gynecology;;    There were no vitals filed for this visit.  Subjective Assessment - 12/28/17 0936    Subjective  I can do everything stuff.  I can reach to a certain point.  When my arm is touched I have a tenderness to touch.  Pain is worse at night.  She has been doing the exercises.  I was using the fence to do the rows on the front pourch.      Pain Location  Shoulder    Pain Orientation  Left    Pain Descriptors / Indicators  Sore;Heaviness    Aggravating Factors   worse at night up to 6-7/10,  no pillow,  not sleeping on the left side,  bra strap,  abduction,  fifting    Pain Relieving Factors  i use a pillow off and on,      Effect of Pain on Daily Activities  pain wakes,  ADL limited,                         [facility] Adult PT  Treatment/Exercise - 12/28/17 0001      Self-Care   Other Self-Care Comments   Ed continued on aftereffects of DN her experience is typical. discussed edema as a possible source of tenderness .      Shoulder Exercises: Supine   External Rotation  10 reps    External Rotation Limitations  yellow band,  needed rolled towel under elbow.    Other Supine Exercises  Diagonals      Shoulder Exercises: Seated   Diagonals Limitations  MEdian nerve glide left x 10 and median, ulnar and radial nerve combined nmaneuver x 5   patient has memorized     Shoulder Exercises: Isometric Strengthening   External Rotation Limitations  5 way isometrics, 10 X 5 seconds   10 X 5 seconds each     Manual Therapy   Manual Therapy  Taping      Kinesiotix   Inhibit Muscle   bicep   Pain relieved            PT Education - 12/28/17 1129    Education Details  self care    Person(s) Educated  Patient    Methods   Explanation    Comprehension  Verbalized understanding       PT Short Term Goals - 12/22/17 1317      PT SHORT TERM GOAL #1   Title  Pt will be independent with initial HEP    Time  3    Period  Weeks    Status  Achieved      PT SHORT TERM GOAL #2   Title  Pt will be able to use UE to increase AROM to at least 100 degrees flexion in order to lift items in kitch to shelf at shoulder height    Baseline  119 12-22-17    Time  3    Period  Weeks    Status  Achieved      PT SHORT TERM GOAL #3   Title  "Demonstrate understanding of proper sitting posture, body mechanics, work ergonomics, and be more conscious of position and posture throughout the day.     Baseline  demonstrates more awareness in clinic    Time  3    Period  Weeks    Status  Achieved        PT Long Term Goals - 12/22/17 1317      PT LONG TERM GOAL #1   Title  "Pt will be independent with advanced HEP.     Baseline  working on resisted and advanced HEP    Time  8    Period  Weeks    Status  On-going      PT LONG TERM GOAL #2   Title  left  shoulder IR and ER will return to [facility] to return to pain-free ADLs such as dressing and grooming.     Time  8    Period  Weeks    Status  On-going      PT LONG TERM GOAL #3   Title  "FOTO will improve from  66%limitation   to  37% limitation    indicating improved functional mobility .     Time  6    Period  Weeks    Status  Unable to assess      PT LONG TERM GOAL #4   Title  left shoulder AROM scaption will improve to 0-150degrees for improved overhead reaching.     Baseline  left shoulder  now 119 flexion    Time  8    Period  Weeks    Status  On-going      PT LONG TERM GOAL #5   Title  Pt will be able to sleep on left side at night with 50% greater comfort for more restorative sleep at night    Baseline  Pt able to sleep on left side but still with TTP pain on anterior left shoulder    Time  8    Period  Weeks    Status  On-going            Plan -  12/28/17 1131    Clinical Impression Statement  Patient tolerated exercise with mild increases in soreness vs pain.  Tape continued due to it being helpful with comfort (New Biceps vs Deltoid) Patient has other medical issues she is keeping on top of .She is overall more functional with arm, her tenderness to palpation continues.. Sitting shoulder flexion about 105 today.     PT Next Visit Plan  Progress shoulder exercise as able.  consider wall slides with towel .  supine AROM,  leaning over counter and  row.    PT Home Exercise Plan  supine cane flexion and ER , Table slides.  lat pull downs and Bil ER supine scapular stabiizers    Consulted and Agree with Plan of Care  Patient       Patient will benefit from skilled therapeutic intervention in order to improve the following deficits and impairments:     Visit Diagnosis: Chronic left shoulder pain  Stiffness of left shoulder, not elsewhere classified  Abnormal posture  Cramp and spasm     Problem List Patient Active Problem List   Diagnosis Date Noted  . Left shoulder pain 11/11/2017  . Viral URI with cough 10/09/2017  . Medication refill 10/09/2017  . Enlarged lymph nodes 08/13/2017  . Cramping of hands 12/31/2016  . Fatigue 11/13/2016  . Elevated antinuclear antibody (ANA) level 09/23/2016  . Eczema 01/27/2016  . Portacath in place 09/12/2015  . Long term current use of anticoagulant therapy 09/06/2015  . Slow rate of speech 08/22/2015  . Chemotherapy-induced peripheral neuropathy ([place]) 06/13/2015  . CKD (chronic kidney disease) stage 3, GFR 30-59 ml/min ([facility]CC) 05/12/2015  . Esophageal reflux 05/12/2015  . Ovarian carcinosarcoma, right ([place]) 03/29/2015  . Endometrial ca ([place]) 03/29/2015  . Ventral hernia without obstruction or gangrene   . Morbid obesity with BMI of 60.0-69.9, adult ([place]) 02/19/2015  . Essential hypertension, benign   . Stress at home 12/14/2014  . Left knee pain 08/04/2013  . Seasonal allergies  07/29/2013  . Right knee pain 11/28/2011    Hayden Mabin  PTA 12/28/2017, 11:39 AM  [facility] [address] [place], [place], [address] Phone: [phone]   Fax:  [phone]  Name: RAEYA MERRITTS MRN: [id number] Date of Birth: January 28, 1966

## 2017-12-29 ENCOUNTER — Other Ambulatory Visit: Payer: Self-pay | Admitting: Obstetrics and Gynecology

## 2017-12-30 ENCOUNTER — Encounter: Payer: Self-pay | Admitting: Physical Therapy

## 2017-12-30 ENCOUNTER — Ambulatory Visit: Payer: Self-pay | Admitting: Physical Therapy

## 2017-12-30 DIAGNOSIS — R293 Abnormal posture: Secondary | ICD-10-CM

## 2017-12-30 DIAGNOSIS — R252 Cramp and spasm: Secondary | ICD-10-CM

## 2017-12-30 DIAGNOSIS — M25512 Pain in left shoulder: Principal | ICD-10-CM

## 2017-12-30 DIAGNOSIS — M25612 Stiffness of left shoulder, not elsewhere classified: Secondary | ICD-10-CM

## 2017-12-30 DIAGNOSIS — G8929 Other chronic pain: Secondary | ICD-10-CM

## 2017-12-30 NOTE — Therapy (Signed)
[place], [place], [address] Phone: [phone]   Fax:  [phone]  Physical Therapy Treatment  Patient Details  Name: [name] MRN: [id number] Date of Birth: 1966/03/03 Referring Provider: Candice Camp MD   Encounter Date: 12/30/2017    Past Medical History:  Diagnosis Date  . Anemia   . Cough   . Diverticulitis with perforation 2010  . DVT (deep vein thrombosis) in pregnancy ([place]) 11/24/2016  . DVT, lower extremity ([place])   . Endometrial cancer ([place])   . Family history of adverse reaction to anesthesia    pts daughter had difficulty awakening following anesthesia   . GERD (gastroesophageal reflux disease)   . Headache   . History of bronchitis   . History of ear infections   . Hx of migraines   . Hypertension   . IBS (irritable bowel syndrome)   . Kidney infection   . Lupus (systemic lupus erythematosus) ([place]) 02/2017  . Morbid obesity ([place])   . Ovarian cancer ([place]) dx'd 01/2015  . Pyelonephritis     Past Surgical History:  Procedure Laterality Date  . ABDOMINAL HYSTERECTOMY N/A 03/13/2015   Procedure: TOTAL HYSTERECTOMY ABDOMINAL BILATERAL SALPINGO OOPHORECTOMY RADICAL TUMOR [place];  Surgeon: Everitt Amber, MD;  Location: [facility];  Service: Gynecology;  Laterality: N/A;  . BOWEL RESECTION  03/13/2015   Procedure: SMALL BOWEL RESECTION;  Surgeon: Everitt Amber, MD;  Location: [facility];  Service: Gynecology;;  . [place] and 1984  . COLOSTOMY  2008  . COLOSTOMY TAKEDOWN    . LAPAROTOMY N/A 03/13/2015   Procedure: EXPLORATORY LAPAROTOMY;  Surgeon: Everitt Amber, MD;  [facility];  Service: Gynecology;  Laterality: N/A;  . TONSILLECTOMY AND ADENOIDECTOMY  1997  . VENTRAL HERNIA REPAIR  03/13/2015   Procedure: HERNIA REPAIR VENTRAL ADULT;  Surgeon: Everitt Amber, MD;  [facility];  Service: Gynecology;;    There were no vitals filed for this visit.  Subjective  Assessment - 12/30/17 0942    Subjective  Tape lasted until the shower.  The tape placement was good.    Shoulder tender anterior,  lateral and superior.  Able to use arm more.  i don't have any appointments yet    Currently in Pain?  Yes    Pain Score  0-No pain   up to 7/10   Pain Location  Shoulder    Pain Orientation  Left    Pain Descriptors / Indicators  Sore    Pain Radiating Towards  medial arm    Pain Frequency  Intermittent    Aggravating Factors   doing something when i forgot about my shoulder    Pain Relieving Factors  change of position,  roll shoulders    Effect of Pain on Daily Activities  reaching limited                       [facility] Adult PT Treatment/Exercise - 12/30/17 0001      Shoulder Exercises: Supine   Horizontal ABduction  10 reps;AAROM   cane   External Rotation  10 reps;AAROM   cane   Internal Rotation  AAROM   cane   Flexion  AAROM;10 reps   cane     Shoulder Exercises: Standing   Flexion  10 reps    Flexion Limitations  pain with towel slides,  has a rough spot at 30 degrees      Kinesiotix  Inhibit Muscle   bicep   Pain relieved,  X strip superior shoulder to creare space50%              PT Short Term Goals - 12/22/17 1317      PT SHORT TERM GOAL #1   Title  Pt will be independent with initial HEP    Time  3    Period  Weeks    Status  Achieved      PT SHORT TERM GOAL #2   Title  Pt will be able to use UE to increase AROM to at least 100 degrees flexion in order to lift items in kitch to shelf at shoulder height    Baseline  119 12-22-17    Time  3    Period  Weeks    Status  Achieved      PT SHORT TERM GOAL #3   Title  "Demonstrate understanding of proper sitting posture, body mechanics, work ergonomics, and be more conscious of position and posture throughout the day.     Baseline  demonstrates more awareness in clinic    Time  3    Period  Weeks    Status  Achieved        PT Long Term Goals -  12/30/17 1324      PT LONG TERM GOAL #1   Title  "Pt will be independent with advanced HEP.     Baseline  continue to progress HEP    Time  8    Period  Weeks    Status  On-going      PT LONG TERM GOAL #3   Title  "FOTO will improve from  66%limitation   to  37% limitation    indicating improved functional mobility .     Time  6    Period  Weeks    Status  Unable to assess      PT LONG TERM GOAL #4   Title  left shoulder AROM scaption will improve to 0-150degrees for improved overhead reaching.     Baseline  left shoulder now 119 flexion    Time  8    Period  Weeks    Status  On-going      PT LONG TERM GOAL #5   Title  Pt will be able to sleep on left side at night with 50% greater comfort for more restorative sleep at night    Time  8    Period  Weeks    Status  Unable to assess            Plan - 12/30/17 1321    Clinical Impression Statement  Patient was able to progress her HEP with mild increases in pain.  Supine shoulder flexion 115 with cane.  tenderness continues Progress toward HEP goals.    PT Next Visit Plan  Progress shoulder exercise as able.  consider wall slides with towel .  supine AROM,  leaning over counter and  row.    PT Home Exercise Plan  supine cane flexion and ER , Table slides.  lat pull downs and Bil ER supine scapular stabiizers,  supine cane    Consulted and Agree with Plan of Care  Patient       Patient will benefit from skilled therapeutic intervention in order to improve the following deficits and impairments:     Visit Diagnosis: Chronic left shoulder pain  Stiffness of left shoulder, not elsewhere classified  Abnormal posture  Cramp  and spasm     Problem List Patient Active Problem List   Diagnosis Date Noted  . Left shoulder pain 11/11/2017  . Viral URI with cough 10/09/2017  . Medication refill 10/09/2017  . Enlarged lymph nodes 08/13/2017  . Cramping of hands 12/31/2016  . Fatigue 11/13/2016  . Elevated antinuclear  antibody (ANA) level 09/23/2016  . Eczema 01/27/2016  . Portacath in place 09/12/2015  . Long term current use of anticoagulant therapy 09/06/2015  . Slow rate of speech 08/22/2015  . Chemotherapy-induced peripheral neuropathy ([place]) 06/13/2015  . CKD (chronic kidney disease) stage 3, GFR 30-59 ml/min (HCC) 05/12/2015  . Esophageal reflux 05/12/2015  . Ovarian carcinosarcoma, right ([place]) 03/29/2015  . Endometrial ca ([place]) 03/29/2015  . Ventral hernia without obstruction or gangrene   . Morbid obesity with BMI of 60.0-69.9, adult ([place]) 02/19/2015  . Essential hypertension, benign   . Stress at home 12/14/2014  . Left knee pain 08/04/2013  . Seasonal allergies 07/29/2013  . Right knee pain 11/28/2011    Bjorn Hallas PTA 12/30/2017, 1:26 PM  [facility] [address] [place], [place], [address] Phone: [phone]   Fax:  [phone]  Name: KERRYANN ALLAIRE MRN: [id number] Date of Birth: [birth year]

## 2017-12-30 NOTE — Patient Instructions (Signed)
SHOULDER: External Rotation - Supine (Cane)   Chest Press On back with knees bent press cane toward ceiling ,  Stretch 1 second Relax Repeat 10 X  2 x a day    Hold cane with both hands. Rotate arm away from body. Keep elbow on floor and next to body. 10___ reps per set, __2_ sets per day. Add towel to keep elbow at side.  Copyright  VHI. All rights reserved.  Cane Horizontal - Supine   With straight arms holding cane above shoulders, bring cane out to right, center, out to left, and back to above head. Repeat __10_ times. Do _2__ times per day.  Copyright  VHI. All rights reserved.  Cane Exercise: Flexion   Lie on back, holding cane above chest. Keeping arms as straight as possible, lower cane toward floor beyond head. Hold __1__ seconds. Repeat ___10_ times. Do _2___ sessions per day.  http://gt2.exer.us/91   Copyright  VHI. All rights reserved.

## 2018-01-01 ENCOUNTER — Inpatient Hospital Stay: Payer: No Typology Code available for payment source | Attending: Obstetrics and Gynecology | Admitting: *Deleted

## 2018-01-01 ENCOUNTER — Other Ambulatory Visit (HOSPITAL_COMMUNITY): Payer: Self-pay | Admitting: *Deleted

## 2018-01-01 ENCOUNTER — Inpatient Hospital Stay: Payer: No Typology Code available for payment source

## 2018-01-01 VITALS — BP 128/72 | Ht 65.0 in | Wt 377.0 lb

## 2018-01-01 DIAGNOSIS — Z0000 Encounter for general adult medical examination without abnormal findings: Secondary | ICD-10-CM

## 2018-01-01 LAB — HEMOGLOBIN A1C
Hgb A1c MFr Bld: 5.7 % — ABNORMAL HIGH (ref 4.8–5.6)
Mean Plasma Glucose: 116.89 mg/dL

## 2018-01-01 LAB — LIPID PANEL
Cholesterol: 241 mg/dL — ABNORMAL HIGH (ref 0–200)
HDL: 54 mg/dL
LDL Cholesterol: 168 mg/dL — ABNORMAL HIGH (ref 0–99)
Total CHOL/HDL Ratio: 4.5 ratio
Triglycerides: 96 mg/dL
VLDL: 19 mg/dL (ref 0–40)

## 2018-01-01 LAB — FECAL OCCULT BLOOD, IMMUNOCHEMICAL: Fecal Occult Bld: NEGATIVE

## 2018-01-01 LAB — SPECIMEN STATUS REPORT

## 2018-01-01 NOTE — Progress Notes (Signed)
Lipid

## 2018-01-01 NOTE — Addendum Note (Signed)
Addended by: Ena Dawley on: 01/01/2018 08:42 AM   Modules accepted: Orders

## 2018-01-01 NOTE — Progress Notes (Signed)
[facility] initial screening  Clinical Measurement:  Height: 65in  Weight: 377lb  Blood Pressure: 130/78   Blood Pressure #2: 128/72  Fasting Labs Drawn Today, will review with patient when they result.  Medical History:  Patient states that she has  been diagnosed with high cholesterol. Patient states she has not been diagnosed with  high blood pressure, diabetes or heart disease.  Medications:  Patients states she is not taking any medications for high cholesterol, high blood pressure or diabetes.  She is not taking aspirin daily to prevent heart attack or stroke.    Blood pressure, self measurement:  Patients states she does not measure blood pressure at home.    Nutrition:  Patient states she eats 0.5 cups of fruit and 2 cups of vegetables in an average day.  Patient states she does not eat fish regularly, she eats less than half a serving of whole grains daily. She drinks less than 36 ounces of beverages with added sugar weekly.  She is currently watching her sodium intake.  She has not had any drinks containing alcohol in the last seven days.    Physical activity:  Patient states that she gets 100 minutes of moderate exercise in a week.  She gets 0 minutes of vigorous exercise per week.    Smoking status:  Patient states she has never smoked and is not around any smokers.    Quality of life:  Patient states that she has had 0 bad physical days out of the last 30 days. In the last 2 weeks, she has had 0 days that she has felt down or depressed. She has had a few days in the last 2 weeks that she has had little interest or pleasure in doing things.  Risk reduction and counseling:  Patient states she wants to lose weight and increase fruit and vegetable intake.  I encouraged her to increase her current exercise regimen and increase vegetable and fruit intake.  Navigation:  I will notify patient of lab results.  Patient is aware of 2 more health coaching sessions and a follow up.

## 2018-01-04 ENCOUNTER — Telehealth (HOSPITAL_COMMUNITY): Payer: Self-pay | Admitting: *Deleted

## 2018-01-04 NOTE — Telephone Encounter (Signed)
Health coaching 2  Labs- cholesterol 241, LDL cholesterol 168, triglycerides 96, HDL cholesterol 54, hemoglobin A1C  5.7,  Mean plasma glucose 116.89   Patient is aware and understands her lab results.  Goals- Patient states she is on the keto diet. I explained that this diet is high fat. I encouraged patient to limit her fats by eating lean meats, fish or skinless chicken.  I also encouraged patient to eat at least 5 fruits and vegetables and to snack on walnuts and almonds (10 - 20).   Navigation:  Patient is aware of 1 more health coaching sessions and a follow up.  Time- 10 minutes

## 2018-01-08 ENCOUNTER — Ambulatory Visit (INDEPENDENT_AMBULATORY_CARE_PROVIDER_SITE_OTHER): Payer: Self-pay | Admitting: Licensed Clinical Social Worker

## 2018-01-08 ENCOUNTER — Encounter

## 2018-01-08 DIAGNOSIS — F4329 Adjustment disorder with other symptoms: Secondary | ICD-10-CM

## 2018-01-08 MED FILL — XARELTO 20 MG TABLET: 20 | 30 days supply | Qty: 30 | Fill #0

## 2018-01-08 NOTE — BH Specialist Note (Signed)
Integrated Behavioral Health Follow Up Visit  MRN: [id number] Name: [name]  Number of [place] Clinician visits: 4th visit in 2019 Session Start time: 9:30  Session End time: 10:15 Total time: 45 minutes Type of Service: [place] Interpretor:No  Interpretor Name and Language: NA  Reason for follow-up: Continue brief intervention to assist patient with managing financial, emotional and physical stressors . Reports somewhat difficulty in daily functions. Appearance:Well Groomed ; Thought process: Coherent; Affect: Appropriate and Tearful at times: No plan to harm self or others  Patient continues to make progress towards goals.  States she is learning to manage stressors and advocate for her health.   PHQ 9=3, indication of: mild depression. GAD-7=1, indication of no anxiety.  Goals: Patient will continue to  1. reduce symptoms of: stress , advocate for health 2. increase  ability EF:EOFHQR skills and stress reduction,  3. Healthy eating Intervention: Solution-Focused Strategies and Link to [employer], Reflective listening, provided resources ($10.00) for patient to attend aquatic classes to assist with knee pain while waiting on referral to sports medicine.   Issues discussed: barriers to medical treatment; going to aquatic center to help with pain ;  Financial barriers ; food resources for heating health. Assessment: Patient continues to experience financial, emotional and physical stress. However she has made progress with manage them.  LCSW spoke to [name] ref. delay in patient's Rheumatology referral, informed patient what she needs to do.   Plan: 1. Patient will F/U with [facility] as needed 2. Behavioral recommendations: continue behavioral activation 3. Patient will go to [employer] program for fruit/Veg 4. Patient will get records transferred to [facility] to that referral coordinator can send record and  referral to [place], [place]   [phone] 11:33 AM

## 2018-01-12 ENCOUNTER — Ambulatory Visit: Payer: Self-pay | Attending: Sports Medicine | Admitting: Physical Therapy

## 2018-01-12 ENCOUNTER — Encounter: Payer: Self-pay | Admitting: Physical Therapy

## 2018-01-12 DIAGNOSIS — R252 Cramp and spasm: Secondary | ICD-10-CM

## 2018-01-12 DIAGNOSIS — M25612 Stiffness of left shoulder, not elsewhere classified: Secondary | ICD-10-CM

## 2018-01-12 DIAGNOSIS — R293 Abnormal posture: Secondary | ICD-10-CM

## 2018-01-12 DIAGNOSIS — G8929 Other chronic pain: Secondary | ICD-10-CM

## 2018-01-12 DIAGNOSIS — M25512 Pain in left shoulder: Secondary | ICD-10-CM | POA: Insufficient documentation

## 2018-01-12 NOTE — Patient Instructions (Addendum)
   As done in clinic,  Standing at table with both hands flat on table.   1) clockwise for 20 reps, 2)counterclockwise for 20 reps. 3) forward bilaterally 20 reps 4) picking up one arm in flexion for 20 and then  Change arms for 20 ( flexion and then wt bearing with left arm)  Voncille Lo, PT Certified Exercise Expert for the Aging Adult  01/12/18 10:48 AM Phone: [phone] Fax: [phone]

## 2018-01-12 NOTE — Therapy (Signed)
[place], [place], [address] Phone: [phone]   Fax:  [phone]  Physical Therapy Treatment  Patient Details  Name: [name] MRN: [id number] Date of Birth: [birth year] Referring Provider (PT): Candice Camp MD   Encounter Date: 01/12/2018  PT End of Session - 01/12/18 1025    Visit Number  10    Number of Visits  16    Date for PT Re-Evaluation  01/26/18    Authorization Type  CAFA  09/10/17 to 03/12/18    PT Start Time  1017    PT Stop Time  1114    PT Time Calculation (min)  57 min    Activity Tolerance  Patient tolerated treatment well    Behavior During Therapy  [facility] for tasks assessed/performed       Past Medical History:  Diagnosis Date  . Anemia   . Cough   . Diverticulitis with perforation 2010  . DVT (deep vein thrombosis) in pregnancy 11/24/2016  . DVT, lower extremity ([place])   . Endometrial cancer ([place])   . Family history of adverse reaction to anesthesia    pts daughter had difficulty awakening following anesthesia   . GERD (gastroesophageal reflux disease)   . Headache   . History of bronchitis   . History of ear infections   . Hx of migraines   . Hypertension   . IBS (irritable bowel syndrome)   . Kidney infection   . Lupus (systemic lupus erythematosus) ([place]) 02/2017  . Morbid obesity ([place])   . Ovarian cancer ([place]) dx'd 01/2015  . Pyelonephritis     Past Surgical History:  Procedure Laterality Date  . ABDOMINAL HYSTERECTOMY N/A 03/13/2015   Procedure: TOTAL HYSTERECTOMY ABDOMINAL BILATERAL SALPINGO OOPHORECTOMY RADICAL TUMOR [place];  Surgeon: Everitt Amber, MD;  [facility];  Service: Gynecology;  Laterality: N/A;  . BOWEL RESECTION  03/13/2015   Procedure: SMALL BOWEL RESECTION;  Surgeon: Everitt Amber, MD;  Location: [facility];  Service: Gynecology;;  . [place] and 1984  . COLOSTOMY  2008  . COLOSTOMY TAKEDOWN    . LAPAROTOMY N/A 03/13/2015   Procedure: EXPLORATORY LAPAROTOMY;  Surgeon: Everitt Amber, MD;  [facility];  Service: Gynecology;  Laterality: N/A;  . TONSILLECTOMY AND ADENOIDECTOMY  1997  . VENTRAL HERNIA REPAIR  03/13/2015   Procedure: HERNIA REPAIR VENTRAL ADULT;  Surgeon: Everitt Amber, MD;  [facility];  Service: Gynecology;;    There were no vitals filed for this visit.  Subjective Assessment - 01/12/18 1026    Subjective  I like the tape, I am still tender.  When I have tape off , I feel more pressure. and I feel i can use my arm better    Pertinent History  chronic kidney dz(Stage 3)  ovarian stage 3/ endometrial , recently with SLE, uses a cane due to right meniscus, obesity, See extensive medical history    Limitations  Lifting;House hold activities    How long can you sit comfortably?  unlimited    How long can you stand comfortably?  unlimted    How long can you walk comfortably?  unlimited    Diagnostic tests  ultrasound dx    Patient Stated Goals  I would like avoid surgery, I want to be able to put on my bra and comb my hair and sleep on my left side , ADL's    Currently in Pain?  Yes    Pain Score  6     Pain Location  Shoulder   post and anterior down lateral arm   Pain Orientation  Left    Pain Descriptors / Indicators  Sore    Pain Type  Chronic pain    Pain Onset  More than a month ago    Pain Frequency  Intermittent         [facility] PT Assessment - 01/12/18 1034      Observation/Other Assessments   Focus on Therapeutic Outcomes (FOTO)   FOTO intake 57% limitation 43% predicted 37%      AROM   Left Shoulder Flexion  120 Degrees   pain at end range   Left Shoulder ABduction  110 Degrees   pain at end range                  [facility] Adult PT Treatment/Exercise - 01/12/18 1102      Shoulder Exercises: Supine   Horizontal ABduction  10 reps;AAROM   cane   External Rotation  10 reps;AAROM   cane   Internal Rotation  AAROM   cane   Flexion  AAROM;10 reps   cane      Shoulder Exercises: Seated   Diagonals Limitations  MEdian nerve glide left x 10 and median, ulnar and radial nerve combined nmaneuver x 5   needed to reinforce  pt not utilizing full ROM     Shoulder Exercises: Standing   Flexion  Strengthening;10 reps    Flexion Limitations  Pt pain starts from 100 to 120 on left    Other Standing Exercises  standing closed chain CW and counter CW on raised mat. 20 times each, and then forward and back 20 times bil and then one arm raised 20 time left and then right      Cryotherapy   Number Minutes Cryotherapy  12 Minutes    Cryotherapy Location  Shoulder   left   Type of Cryotherapy  Ice pack      Manual Therapy   Manual Therapy  Taping    Manual therapy comments  skilled palpation for Triggfer point manual therapy    Soft tissue mobilization  STM for post and anterior shoulder trigger points on left    Kinesiotex  Create Space   anterior and posterior shoulder            PT Education - 01/12/18 1048    Education Details  ;added closed chain exercise to HEP    Person(s) Educated  Patient    Methods  Explanation;Demonstration;Tactile cues;Verbal cues;Handout    Comprehension  Verbalized understanding;Returned demonstration       PT Short Term Goals - 12/22/17 1317      PT SHORT TERM GOAL #1   Title  Pt will be independent with initial HEP    Time  3    Period  Weeks    Status  Achieved      PT SHORT TERM GOAL #2   Title  Pt will be able to use UE to increase AROM to at least 100 degrees flexion in order to lift items in kitch to shelf at shoulder height    Baseline  119 12-22-17    Time  3    Period  Weeks    Status  Achieved      PT SHORT TERM GOAL #3   Title  "Demonstrate understanding of proper sitting posture, body mechanics, work ergonomics, and be more conscious of position and posture throughout the day.  Baseline  demonstrates more awareness in clinic    Time  3    Period  Weeks    Status  Achieved        PT  Long Term Goals - 01/12/18 1028      PT LONG TERM GOAL #1   Title  "Pt will be independent with advanced HEP.     Baseline  continue to progress HEP form open chain to close chain    Time  8    Period  Weeks    Status  On-going    Target Date  01/26/18      PT LONG TERM GOAL #2   Title  left  shoulder IR and ER will return to [facility] to return to pain-free ADLs such as dressing and grooming.     Baseline  Pt still with pain with minimal movements but especiall over 90 flex and abd    Time  8    Period  Weeks    Status  On-going      PT LONG TERM GOAL #3   Title  "FOTO will improve from  66%limitation   to  37% limitation    indicating improved functional mobility .     Baseline  01-12-18 43% limitation    Time  6    Status  Partially Met    Target Date  01/26/18      PT LONG TERM GOAL #4   Title  left shoulder AROM scaption will improve to 0-150degrees for improved overhead reaching.     Baseline  left shoulder now 120 flexion    Time  8    Period  Weeks      PT LONG TERM GOAL #5   Title  Pt will be able to sleep on left side at night with 50% greater comfort for more restorative sleep at night    Baseline  Pt tries to avoid left side at night. Sometimes roll over onto it and doesnt wake up  pt reported    Time  8    Period  Weeks    Status  Unable to assess            Plan - 01/12/18 1657    Clinical Impression Statement  Pt has progressed with AROM but still complains of TTP of anterior and posterior shoulder.  Pt prefers to have taping of shoulder for support.  Pt improved FOTO to 43% limitation. ( predicted 37%).  Pt able to place an object at 90 degrees on shelf but has difficulty with anything above shoulder level.  Pt able to sleep onright side at times and sometimes finds herself rolled onto right shoulder at night but still complains of pain.  will continue to maximize function in visits left.    Rehab Potential  Good    PT Frequency  2x / week    PT Duration  6  weeks    PT Treatment/Interventions  ADLs/Self Care Home Management;Cryotherapy;Electrical Stimulation;Iontophoresis 39m/ml Dexamethasone;Moist Heat;Ultrasound;Therapeutic exercise;Therapeutic activities;Neuromuscular re-education;Patient/family education;Manual techniques;Passive range of motion;Dry needling;Taping    PT Next Visit Plan  Progress shoulder exercise as able.  consider wall slides with towel .  supine AROM,  leaning over counter and  row.  Pt seems to perform better with closed chain exercises    PT Home Exercise Plan  supine cane flexion and ER , Table slides.  lat pull downs and Bil ER supine scapular stabiizers,  standing closed chain exercises reduces pain  Consulted and Agree with Plan of Care  Patient       Patient will benefit from skilled therapeutic intervention in order to improve the following deficits and impairments:  Pain, Postural dysfunction, Improper body mechanics, Decreased range of motion, Decreased strength, Increased fascial restricitons, Increased muscle spasms, Impaired flexibility, Impaired UE functional use  Visit Diagnosis: Chronic left shoulder pain  Stiffness of left shoulder, not elsewhere classified  Abnormal posture  Cramp and spasm     Problem List Patient Active Problem List   Diagnosis Date Noted  . Left shoulder pain 11/11/2017  . Viral URI with cough 10/09/2017  . Medication refill 10/09/2017  . Enlarged lymph nodes 08/13/2017  . Cramping of hands 12/31/2016  . Fatigue 11/13/2016  . Elevated antinuclear antibody (ANA) level 09/23/2016  . Eczema 01/27/2016  . Portacath in place 09/12/2015  . Long term current use of anticoagulant therapy 09/06/2015  . Slow rate of speech 08/22/2015  . Chemotherapy-induced peripheral neuropathy ([place]) 06/13/2015  . CKD (chronic kidney disease) stage 3, GFR 30-59 ml/min (HCC) 05/12/2015  . Esophageal reflux 05/12/2015  . Ovarian carcinosarcoma, right ([place]) 03/29/2015  . Endometrial ca ([place])  03/29/2015  . Ventral hernia without obstruction or gangrene   . Morbid obesity with BMI of 60.0-69.9, adult ([place]) 02/19/2015  . Essential hypertension, benign   . Stress at home 12/14/2014  . Left knee pain 08/04/2013  . Seasonal allergies 07/29/2013  . Right knee pain 11/28/2011    Voncille Lo, PT Certified Exercise Expert for the Aging Adult  01/12/18 5:01 PM Phone: [phone] Fax: [facility] [address] [place], [place], [address] Phone: [phone]   Fax:  [phone]  Name: [name] MRN: [id number] Date of Birth: 11/26/1965

## 2018-01-14 ENCOUNTER — Other Ambulatory Visit: Payer: Self-pay

## 2018-01-14 ENCOUNTER — Ambulatory Visit (INDEPENDENT_AMBULATORY_CARE_PROVIDER_SITE_OTHER): Payer: Self-pay | Admitting: Family Medicine

## 2018-01-14 ENCOUNTER — Encounter: Payer: Self-pay | Admitting: Family Medicine

## 2018-01-14 VITALS — HR 66 | Temp 97.8°F | Ht 65.0 in | Wt 378.0 lb

## 2018-01-14 DIAGNOSIS — E785 Hyperlipidemia, unspecified: Secondary | ICD-10-CM

## 2018-01-14 DIAGNOSIS — G8929 Other chronic pain: Secondary | ICD-10-CM

## 2018-01-14 DIAGNOSIS — R768 Other specified abnormal immunological findings in serum: Secondary | ICD-10-CM

## 2018-01-14 DIAGNOSIS — N183 Chronic kidney disease, stage 3 unspecified: Secondary | ICD-10-CM

## 2018-01-14 DIAGNOSIS — Z6841 Body Mass Index (BMI) 40.0 and over, adult: Secondary | ICD-10-CM

## 2018-01-14 DIAGNOSIS — M25561 Pain in right knee: Secondary | ICD-10-CM

## 2018-01-14 DIAGNOSIS — M25512 Pain in left shoulder: Secondary | ICD-10-CM

## 2018-01-14 DIAGNOSIS — Z23 Encounter for immunization: Secondary | ICD-10-CM

## 2018-01-14 MED ORDER — OXYBUTYNIN CHLORIDE 5 MG PO TABS: 5.0000 mg | ORAL_TABLET | Freq: Every evening | ORAL | 2 refills | Status: DC | PRN

## 2018-01-14 NOTE — Assessment & Plan Note (Signed)
-   Continue with physical therapy.

## 2018-01-14 NOTE — Assessment & Plan Note (Addendum)
With positive anticentromere antibodies.  Plan to refer to rheumatology for further work-up for this and her ongoing arthralgias.  Will refer to [facility] rheumatology. - Have discussed with her how her morbid obesity is also worsening this.  Patient expresses understanding this. -Also could be possibility that her rheumatologic lab tests are not corresponding with her actual pain.

## 2018-01-14 NOTE — Assessment & Plan Note (Signed)
No improvement with corticosteroid injection.  She would like to wait to see rheumatology before following up with orthopedics.  Believe weight loss will help with this

## 2018-01-14 NOTE — Assessment & Plan Note (Signed)
Wants to start the keto diet.  She is worried about her kidneys.  Discussed that the risks of remaining overweight likely outweigh any risks to her kidneys from keto heavy diet. -We will check her creatinine on a regular basis.  First will be 2 weeks after she starts the diet.  Second will be about a month later.

## 2018-01-14 NOTE — Progress Notes (Signed)
Subjective:    [name] is a 52 y.o. female who presents to [facility] today for BL knee pain:  1.  Arthralgias:  Persists.  Patient received corticosteroid shot here last visit in August for her knee.  Has also been going to Rehab for chronic left shoulder pain and this is helped somewhat but she continues to experience joint pains throughout.  The pain is probably the worst but she also has bilateral shoulder, wrist pains.  Rest and occasional ibuprofen helps.  She has occasional joint swelling as well.  No redness that she has noted.  No falls or injuries.  She has struggled with seeing rheumatology.  Evidently there is some hold up with the referral.  It is been over a month she has become a little frustrated.  She is asking about another referral to a different rheumatologist.  Finances limit her options.  2.  Lack of sleep: Seems to be mostly secondary to nocturia.  This occurs to 3 times nightly.  Has been present for at least a year if not longer.  States she is just waiting to bring this up as she did not think it was important but she is struggling from lack of sleep.  Denies any racing thoughts or anxiety.  Does have issues with incontinence during day when she can't make it to her bathroom.  Not commonly.   Continues with [facility] for ongoing stress and adjustment reactions.  Feels this is helpful.     ROS as above per HPI.  Pertinently, no chest pain, palpitations, SOB, Fever, Chills, Abd pain, N/V/D.   The following portions of the patient's history were reviewed and updated as appropriate: allergies, current medications, past medical history, family and social history, and problem list. Patient is a nonsmoker.    PMH reviewed.  Past Medical History:  Diagnosis Date  . Anemia   . Cough   . Diverticulitis with perforation 2010  . DVT (deep vein thrombosis) in pregnancy 11/24/2016  . DVT, lower extremity ([place])   . Endometrial cancer ([place])   . Family history of adverse  reaction to anesthesia    pts daughter had difficulty awakening following anesthesia   . GERD (gastroesophageal reflux disease)   . Headache   . History of bronchitis   . History of ear infections   . Hx of migraines   . Hypertension   . IBS (irritable bowel syndrome)   . Kidney infection   . Lupus (systemic lupus erythematosus) ([place]) 02/2017  . Morbid obesity ([place])   . Ovarian cancer ([place]) dx'd 01/2015  . Pyelonephritis    Past Surgical History:  Procedure Laterality Date  . ABDOMINAL HYSTERECTOMY N/A 03/13/2015   Procedure: TOTAL HYSTERECTOMY ABDOMINAL BILATERAL SALPINGO OOPHORECTOMY RADICAL TUMOR [place];  Surgeon: Everitt Amber, MD;  Location: [facility];  Service: Gynecology;  Laterality: N/A;  . BOWEL RESECTION  03/13/2015   Procedure: SMALL BOWEL RESECTION;  Surgeon: Everitt Amber, MD;  Location: [facility];  Service: Gynecology;;  . [place] and 1984  . COLOSTOMY  2008  . COLOSTOMY TAKEDOWN    . LAPAROTOMY N/A 03/13/2015   Procedure: EXPLORATORY LAPAROTOMY;  Surgeon: Everitt Amber, MD;  [facility];  Service: Gynecology;  Laterality: N/A;  . TONSILLECTOMY AND ADENOIDECTOMY  1997  . VENTRAL HERNIA REPAIR  03/13/2015   Procedure: HERNIA REPAIR VENTRAL ADULT;  Surgeon: Everitt Amber, MD;  [facility]: [facility];  Service: Gynecology;;    Medications reviewed. Current Outpatient Medications  Medication Sig  Dispense Refill  . acetaminophen (TYLENOL) 500 MG tablet Take 1,000 mg by mouth every 6 (six) hours as needed for mild pain, moderate pain, fever or headache. Reported on 09/10/2015    . cetirizine (ZYRTEC) 10 MG tablet Take 1 tablet (10 mg total) by mouth daily as needed for allergies (itching). 30 tablet 0  . furosemide (LASIX) 20 MG tablet Take 1 tab by mouth daily x 1 week, then PRN daily after that for leg swelling 30 tablet 0  . gabapentin (NEURONTIN) 100 MG capsule Take 100 mg by mouth at bedtime as needed.     . Iron-Vitamins (GERITOL COMPLETE) TABS Take 1 tablet by mouth  daily.    Marland Kitchen lidocaine-prilocaine (EMLA) cream Apply to PAC site 1-2 hours prior to access as needed. 30 g 2  . Oral Electrolytes ([employer] ELECTROLYTE SOLUTION PO) Take 20-40 drops by mouth daily. Place 20-40 drops in liquid daily    . pantoprazole (PROTONIX) 40 MG tablet Take 1 tablet (40 mg total) by mouth daily. 30 tablet 1  . rivaroxaban (XARELTO) 20 MG TABS tablet Take 1 tablet (20 mg total) by mouth daily with supper. 14 tablet 0  . triamcinolone cream (KENALOG) 0.1 % APPLY TO THE AFFECTED AREA(S) TWICE DAILY AS NEEDED 30 g 1   No current facility-administered medications for this visit.    Facility-Administered Medications Ordered in Other Visits  Medication Dose Route Frequency Provider Last Rate Last Dose  . sodium chloride flush (NS) 0.9 % injection 10 mL  10 mL Intravenous PRN Livesay, Lennis P, MD   10 mL at 05/31/15 0300  . sodium chloride flush (NS) 0.9 % injection 10 mL  10 mL Intravenous PRN Livesay, Lennis P, MD   10 mL at 08/02/15 1442     Objective:   Physical Exam Pulse 66   Temp 97.8 F (36.6 C) (Oral)   Ht 5\' 5"  (1.651 m)   Wt (!) 378 lb (171.5 kg)   LMP 10/26/2013   SpO2 (!) 66%   BMI 62.90 kg/m  Gen:  Alert, cooperative patient who appears stated age in no acute distress.  Vital signs reviewed. HEENT: EOMI,  MMM Cardiac:  Regular rate and rhythm without murmur auscultated.  Good S1/S2. Pulm:  Clear to auscultation bilaterally with good air movement.  No wheezes or rales noted.   Abd:  Soft/nondistended/nontender.  Good bowel sounds throughout all four quadrants.  No masses noted.  MSK: No joint effusions or redness noted.  She is tender to palpation along joint lines of her right knee.  No joint laxity noted.  Minimal tenderness along her left shoulder.  This is wrapped in Ace bandage.  She is using a cane to ambulate. Neuro: No focal deficits noted. Extremity: Obese without any appreciable pitting edema bilateral ankles  No results found for this or any  previous visit (from the past 72 hour(s)).

## 2018-01-14 NOTE — Assessment & Plan Note (Signed)
As above.  She is starting keto diet.  Affecting her overall health and she recognizes this.

## 2018-01-14 NOTE — Patient Instructions (Signed)
It was good to see you again today.  I plan to refer you to [facility].  I think this will be helpful for you.  Start the Oxybutynin 1 pill at night for urine.  We can increase this if we need it.    I think keto is a good idea.  We'll do a lab test the last week of October, and then repeat this around Thanksgiving.    Come back and see me before Christmas.

## 2018-01-15 ENCOUNTER — Other Ambulatory Visit: Payer: Self-pay

## 2018-01-15 ENCOUNTER — Telehealth: Payer: Self-pay

## 2018-01-15 DIAGNOSIS — C561 Malignant neoplasm of right ovary: Secondary | ICD-10-CM

## 2018-01-15 NOTE — Telephone Encounter (Signed)
Fax from medication assistance program.  They do not stock Ditropan but can obtain Toviaz 4 mg at no charge to patient through [facility].  If PCP will consider a change in therapy, please send a new prescription.  Danley Danker, RN [facility] RN)

## 2018-01-16 MED ORDER — PANTOPRAZOLE SODIUM 40 MG PO TBEC: 40.0000 mg | DELAYED_RELEASE_TABLET | Freq: Every day | ORAL | 1 refills | Status: DC

## 2018-01-17 ENCOUNTER — Encounter: Payer: Self-pay | Admitting: Family Medicine

## 2018-01-18 ENCOUNTER — Ambulatory Visit: Payer: Self-pay | Admitting: Physical Therapy

## 2018-01-18 ENCOUNTER — Encounter: Payer: Self-pay | Admitting: Physical Therapy

## 2018-01-18 DIAGNOSIS — R252 Cramp and spasm: Secondary | ICD-10-CM

## 2018-01-18 DIAGNOSIS — M25612 Stiffness of left shoulder, not elsewhere classified: Secondary | ICD-10-CM

## 2018-01-18 DIAGNOSIS — M25512 Pain in left shoulder: Principal | ICD-10-CM

## 2018-01-18 DIAGNOSIS — G8929 Other chronic pain: Secondary | ICD-10-CM

## 2018-01-18 DIAGNOSIS — R293 Abnormal posture: Secondary | ICD-10-CM

## 2018-01-18 NOTE — Therapy (Signed)
[place], [place], [address] Phone: [phone]   Fax:  [phone]  Physical Therapy Treatment  Patient Details  Name: [name] MRN: [id number] Date of Birth: 05-03-1965 Referring Provider (PT): Candice Camp MD   Encounter Date: 01/18/2018  PT End of Session - 01/18/18 0924    Visit Number  11    Number of Visits  165    Date for PT Re-Evaluation  01/26/18    Authorization Type  CAFA  09/10/17 to 03/12/18    PT Start Time  0846    PT Stop Time  0925    PT Time Calculation (min)  39 min    Activity Tolerance  Patient tolerated treatment well    Behavior During Therapy  [facility] for tasks assessed/performed       Past Medical History:  Diagnosis Date  . Anemia   . Cough   . Diverticulitis with perforation 2010  . DVT (deep vein thrombosis) in pregnancy 11/24/2016  . DVT, lower extremity ([place])   . Endometrial cancer ([place])   . Family history of adverse reaction to anesthesia    pts daughter had difficulty awakening following anesthesia   . GERD (gastroesophageal reflux disease)   . Headache   . History of bronchitis   . History of ear infections   . Hx of migraines   . Hypertension   . IBS (irritable bowel syndrome)   . Kidney infection   . Lupus (systemic lupus erythematosus) ([place]) 02/2017  . Morbid obesity ([place])   . Ovarian cancer ([place]) dx'd 01/2015  . Pyelonephritis     Past Surgical History:  Procedure Laterality Date  . ABDOMINAL HYSTERECTOMY N/A 03/13/2015   Procedure: TOTAL HYSTERECTOMY ABDOMINAL BILATERAL SALPINGO OOPHORECTOMY RADICAL TUMOR [place];  Surgeon: Everitt Amber, MD;  [facility];  Service: Gynecology;  Laterality: N/A;  . BOWEL RESECTION  03/13/2015   Procedure: SMALL BOWEL RESECTION;  Surgeon: Everitt Amber, MD;  [facility];  Service: Gynecology;;  . [place] and 1984  . COLOSTOMY  2008  . COLOSTOMY TAKEDOWN    . LAPAROTOMY N/A 03/13/2015   Procedure: EXPLORATORY LAPAROTOMY;  Surgeon: Everitt Amber, MD;  [facility];  Service: Gynecology;  Laterality: N/A;  . TONSILLECTOMY AND ADENOIDECTOMY  1997  . VENTRAL HERNIA REPAIR  03/13/2015   Procedure: HERNIA REPAIR VENTRAL ADULT;  Surgeon: Everitt Amber, MD;  [facility];  Service: Gynecology;;    There were no vitals filed for this visit.  Subjective Assessment - 01/18/18 0850    Subjective  I am going to be referred to [facility] to get the Lupus checked out.  I have pain in both arms and in the joints.  the cold makes me stiff all over,  legs ankles arms.      Currently in Pain?  No/denies    Pain Location  Shoulder    Pain Descriptors / Indicators  Aching    Pain Radiating Towards  whole arm hamd wrist  elbow    Pain Frequency  Intermittent    Aggravating Factors   does not know,, perhaps cold weather makes stiff    Pain Relieving Factors  wrapping a throw,  long sleeves,  hot tea with honey    Effect of Pain on Daily Activities  reaching limited         [facility] PT Assessment - 01/18/18 0001      AROM   Left Shoulder Flexion  110 Degrees    Left Shoulder ABduction  100 Degrees   early morning                  [facility] Adult PT Treatment/Exercise - 01/18/18 0001      Shoulder Exercises: Seated   Row  20 reps    Theraband Level (Shoulder Row)  Level 1 (Yellow)   moderate cues initially   Row Limitations  No pain      Shoulder Exercises: Standing   Flexion  10 reps    Flexion Limitations  both towel slide    Other Standing Exercises  standing closed chain CW and counter CW on raised mat. 20 times each, and then forward and back 20 times bil    ball on wall 10 X each way circles with press   Other Standing Exercises  wall push ups 10 X      Shoulder Exercises: Stretch   Other Shoulder Stretches  single and double doorways stretch added to HEP             PT Education - 01/18/18 0923    Education Details  HEP,  ER doorway    Person(s)  Educated  Patient    Methods  Explanation;Demonstration    Comprehension  Verbalized understanding;Returned demonstration       PT Short Term Goals - 12/22/17 1317      PT SHORT TERM GOAL #1   Title  Pt will be independent with initial HEP    Time  3    Period  Weeks    Status  Achieved      PT SHORT TERM GOAL #2   Title  Pt will be able to use UE to increase AROM to at least 100 degrees flexion in order to lift items in kitch to shelf at shoulder height    Baseline  119 12-22-17    Time  3    Period  Weeks    Status  Achieved      PT SHORT TERM GOAL #3   Title  "Demonstrate understanding of proper sitting posture, body mechanics, work ergonomics, and be more conscious of position and posture throughout the day.     Baseline  demonstrates more awareness in clinic    Time  3    Period  Weeks    Status  Achieved        PT Long Term Goals - 01/18/18 0930      PT LONG TERM GOAL #1   Title  "Pt will be independent with advanced HEP.     Baseline  continue to build HEP    Time  8    Period  Weeks    Status  On-going      PT LONG TERM GOAL #2   Title  left  shoulder IR and ER will return to [facility] to return to pain-free ADLs such as dressing and grooming.     Baseline  addressed with exercise    Time  8    Period  Weeks    Status  On-going      PT LONG TERM GOAL #3   Title  "FOTO will improve from  66%limitation   to  37% limitation    indicating improved functional mobility .     Time  6    Period  Weeks    Status  Unable to assess      PT LONG TERM GOAL #4   Baseline  1110  Time  8    Period  Weeks    Status  On-going      PT LONG TERM GOAL #5   Title  Pt will be able to sleep on left side at night with 50% greater comfort for more restorative sleep at night    Baseline  does not sleep on shou;lder    Time  8    Period  Weeks    Status  On-going            Plan - 01/18/18 0925    Clinical Impression Statement  Patient had scabbs small from tape.  She  reports she did not want to remove tape the next day when it started itching.  No tape today due to scabs.  AA ROM improving.  AROM 110 flexion,  100 abduction post exercise.  She declined the need for modalities.  No pain at end of session.   Shje has been comlpiant with the HEP.    PT Next Visit Plan  Progress shoulder exercise as able.  consider wall slides with towel .  supine AROM,  leaning over counter and  row.  Pt seems to perform better with closed chain exercises    PT Home Exercise Plan  supine cane flexion and ER , Table slides.  lat pull downs and Bil ER supine scapular stabiizers,  standing closed chain exercises reduces pain ,  shoulder door stretch    Consulted and Agree with Plan of Care  Patient       Patient will benefit from skilled therapeutic intervention in order to improve the following deficits and impairments:     Visit Diagnosis: Chronic left shoulder pain  Stiffness of left shoulder, not elsewhere classified  Abnormal posture  Cramp and spasm     Problem List Patient Active Problem List   Diagnosis Date Noted  . Left shoulder pain 11/11/2017  . Medication refill 10/09/2017  . Enlarged lymph nodes 08/13/2017  . Cramping of hands 12/31/2016  . Fatigue 11/13/2016  . Elevated antinuclear antibody (ANA) level 09/23/2016  . Eczema 01/27/2016  . Portacath in place 09/12/2015  . Long term current use of anticoagulant therapy 09/06/2015  . Slow rate of speech 08/22/2015  . Chemotherapy-induced peripheral neuropathy ([place]) 06/13/2015  . CKD (chronic kidney disease) stage 3, GFR 30-59 ml/min (HCC) 05/12/2015  . Esophageal reflux 05/12/2015  . Ovarian carcinosarcoma, right ([place]) 03/29/2015  . Endometrial ca ([place]) 03/29/2015  . Ventral hernia without obstruction or gangrene   . Morbid obesity with BMI of 60.0-69.9, adult ([place]) 02/19/2015  . Essential hypertension, benign   . Stress at home 12/14/2014  . Left knee pain 08/04/2013  . Seasonal allergies  07/29/2013  . Right knee pain 11/28/2011    Lawerence Dery PTA 01/18/2018, 9:54 AM  [facility] [address] [place], [place], [address] Phone: [phone]   Fax:  [phone]  Name: [name] MRN: [id number] Date of Birth: 1966/01/08

## 2018-01-18 NOTE — Patient Instructions (Signed)
Issued from exercise drawer. Shoulder ER stretch Single and double  Daily 3 x 20-30 seconds

## 2018-01-19 MED ORDER — FESOTERODINE FUMARATE ER 4 MG PO TB24: 4.0000 mg | ORAL_TABLET | Freq: Every day | ORAL | 1 refills | Status: DC

## 2018-01-19 NOTE — Telephone Encounter (Signed)
Switch to Toviaz made.

## 2018-01-19 NOTE — Telephone Encounter (Signed)
2nd request for [name] if appropriate for patient.

## 2018-01-20 ENCOUNTER — Ambulatory Visit: Payer: Self-pay | Admitting: Physical Therapy

## 2018-01-20 ENCOUNTER — Encounter: Payer: Self-pay | Admitting: Physical Therapy

## 2018-01-20 DIAGNOSIS — R252 Cramp and spasm: Secondary | ICD-10-CM

## 2018-01-20 DIAGNOSIS — R293 Abnormal posture: Secondary | ICD-10-CM

## 2018-01-20 DIAGNOSIS — M25512 Pain in left shoulder: Principal | ICD-10-CM

## 2018-01-20 DIAGNOSIS — G8929 Other chronic pain: Secondary | ICD-10-CM

## 2018-01-20 DIAGNOSIS — M25612 Stiffness of left shoulder, not elsewhere classified: Secondary | ICD-10-CM

## 2018-01-20 NOTE — Therapy (Signed)
[place], [place], [address] Phone: [phone]   Fax:  [phone]  Physical Therapy Treatment  Patient Details  Name: [name] MRN: [id number] Date of Birth: Aug 25, 1965 Referring Provider (PT): Candice Camp MD   Encounter Date: 01/20/2018  PT End of Session - 01/20/18 1115    Visit Number  12    Date for PT Re-Evaluation  01/26/18    Authorization Type  CAFA  09/10/17 to 03/12/18    PT Start Time  1015    PT Stop Time  1100    PT Time Calculation (min)  45 min    Activity Tolerance  Patient tolerated treatment well    Behavior During Therapy  [facility] for tasks assessed/performed       Past Medical History:  Diagnosis Date  . Anemia   . Cough   . Diverticulitis with perforation 2010  . DVT (deep vein thrombosis) in pregnancy 11/24/2016  . DVT, lower extremity ([place])   . Endometrial cancer ([place])   . Family history of adverse reaction to anesthesia    pts daughter had difficulty awakening following anesthesia   . GERD (gastroesophageal reflux disease)   . Headache   . History of bronchitis   . History of ear infections   . Hx of migraines   . Hypertension   . IBS (irritable bowel syndrome)   . Kidney infection   . Lupus (systemic lupus erythematosus) ([place]) 02/2017  . Morbid obesity ([place])   . Ovarian cancer ([place]) dx'd 01/2015  . Pyelonephritis     Past Surgical History:  Procedure Laterality Date  . ABDOMINAL HYSTERECTOMY N/A 03/13/2015   Procedure: TOTAL HYSTERECTOMY ABDOMINAL BILATERAL SALPINGO OOPHORECTOMY RADICAL TUMOR [place];  Surgeon: Everitt Amber, MD;  [facility];  Service: Gynecology;  Laterality: N/A;  . BOWEL RESECTION  03/13/2015   Procedure: SMALL BOWEL RESECTION;  Surgeon: Everitt Amber, MD;  [facility];  Service: Gynecology;;  . [place] and 1984  . COLOSTOMY  2008  . COLOSTOMY TAKEDOWN    . LAPAROTOMY N/A 03/13/2015   Procedure: EXPLORATORY  LAPAROTOMY;  Surgeon: Everitt Amber, MD;  [facility];  Service: Gynecology;  Laterality: N/A;  . TONSILLECTOMY AND ADENOIDECTOMY  1997  . VENTRAL HERNIA REPAIR  03/13/2015   Procedure: HERNIA REPAIR VENTRAL ADULT;  Surgeon: Everitt Amber, MD;  [facility];  Service: Gynecology;;    There were no vitals filed for this visit.  Subjective Assessment - 01/20/18 1019    Subjective  Stiff in am.  Starts to loosen about mid day.  Slept on shoulder and woke with 7/10 pain.     Currently in Pain?  Yes    Pain Score  5    At end of day 6-7/10 after ADL.   Pain Location  Shoulder    Pain Orientation  Left    Pain Descriptors / Indicators  Sore;Aching   stiff   Aggravating Factors   putting deodorant on,  ADL,  cooking    Pain Relieving Factors  heat Ice at end of day    Effect of Pain on Daily Activities  reaching limited,  sleeping on shoulder limited,  tender to touch.     Multiple Pain Sites  --   Knee  8/10  to go to [facility] for cxheck [facility].  [place] Adult PT Treatment/Exercise - 01/20/18 0001      Self-Care   Other Self-Care Comments   extra time with poster and shoulder model for anatomy ed.  posture componets,  how shoulder works,  Social research officer, government,  many questions answered and others i refered her to the MD.      Shoulder Exercises: Seated   Row  10 reps    Theraband Level (Shoulder Row)  Level 2 (Red)    Row Limitations  cued to stop neutral shoulder      Shoulder Exercises: Standing   Flexion  10 reps   1 LB barbell to 1st shelf.  .  No increased pain,  fatigue.    Flexion Limitations  both towel slide   15   Other Standing Exercises  ball on wall 10 x each circles, press flexion,,  side to side   shoulder height,  pain increased counter clockwise,  cues      Shoulder Exercises: ROM/Strengthening   UBE (Upper Arm Bike)  L1 forward 3 min 40 seconds.   5 minutes total endin with reverse.               PT Education - 01/20/18 1115     Education Details  Shoulder anatomy,  posture importance etc.    Person(s) Educated  Patient    Methods  Explanation;Demonstration    Comprehension  Verbalized understanding       PT Short Term Goals - 12/22/17 1317      PT SHORT TERM GOAL #1   Title  Pt will be independent with initial HEP    Time  3    Period  Weeks    Status  Achieved      PT SHORT TERM GOAL #2   Title  Pt will be able to use UE to increase AROM to at least 100 degrees flexion in order to lift items in kitch to shelf at shoulder height    Baseline  119 12-22-17    Time  3    Period  Weeks    Status  Achieved      PT SHORT TERM GOAL #3   Title  "Demonstrate understanding of proper sitting posture, body mechanics, work ergonomics, and be more conscious of position and posture throughout the day.     Baseline  demonstrates more awareness in clinic    Time  3    Period  Weeks    Status  Achieved        PT Long Term Goals - 01/18/18 0930      PT LONG TERM GOAL #1   Title  "Pt will be independent with advanced HEP.     Baseline  continue to build HEP    Time  8    Period  Weeks    Status  On-going      PT LONG TERM GOAL #2   Title  left  shoulder IR and ER will return to [facility] to return to pain-free ADLs such as dressing and grooming.     Baseline  addressed with exercise    Time  8    Period  Weeks    Status  On-going      PT LONG TERM GOAL #3   Title  "FOTO will improve from  66%limitation   to  37% limitation    indicating improved functional mobility .     Time  6    Period  Weeks    Status  Unable  to assess      PT LONG TERM GOAL #4   Baseline  1110    Time  8    Period  Weeks    Status  On-going      PT LONG TERM GOAL #5   Title  Pt will be able to sleep on left side at night with 50% greater comfort for more restorative sleep at night    Baseline  does not sleep on shou;lder    Time  8    Period  Weeks    Status  On-going            Plan - 01/20/18 1116    Clinical  Impression Statement  Patient has been able to tolerate exercise progression today with intermittant pain flares to 4/10.  She has many questions about shoulder. Most were anatomy questions.  The other questions were defered to the MD.    Pain mild at end of session.  She declined the need of tape or modalities.   Objective findings: shoulder flexion about 85 sitting AROM. with 4/10 pain.    PT Next Visit Plan  Progress shoulder exercise as able.  continue wall slides with towel .   Asses response to  ball on wall, shelf reach.  Pt seems to perform better with closed chain exercises    PT Home Exercise Plan  supine cane flexion and ER , Table slides.  lat pull downs and Bil ER supine scapular stabiizers,  standing closed chain exercises reduces pain ,  shoulder door stretch    Consulted and Agree with Plan of Care  Patient       Patient will benefit from skilled therapeutic intervention in order to improve the following deficits and impairments:     Visit Diagnosis: Chronic left shoulder pain  Stiffness of left shoulder, not elsewhere classified  Abnormal posture  Cramp and spasm     Problem List Patient Active Problem List   Diagnosis Date Noted  . Left shoulder pain 11/11/2017  . Medication refill 10/09/2017  . Enlarged lymph nodes 08/13/2017  . Cramping of hands 12/31/2016  . Fatigue 11/13/2016  . Elevated antinuclear antibody (ANA) level 09/23/2016  . Eczema 01/27/2016  . Portacath in place 09/12/2015  . Long term current use of anticoagulant therapy 09/06/2015  . Slow rate of speech 08/22/2015  . Chemotherapy-induced peripheral neuropathy ([place]) 06/13/2015  . CKD (chronic kidney disease) stage 3, GFR 30-59 ml/min (HCC) 05/12/2015  . Esophageal reflux 05/12/2015  . Ovarian carcinosarcoma, right ([place]) 03/29/2015  . Endometrial ca ([place]) 03/29/2015  . Ventral hernia without obstruction or gangrene   . Morbid obesity with BMI of 60.0-69.9, adult ([place]) 02/19/2015  . Essential  hypertension, benign   . Stress at home 12/14/2014  . Left knee pain 08/04/2013  . Seasonal allergies 07/29/2013  . Right knee pain 11/28/2011    HARRIS,KAREN  PTA 01/20/2018, 11:22 AM  [place], [place], [address] Phone: [phone]   Fax:  [phone]  Name: [name] MRN: [id number] Date of Birth: May 14, 1965

## 2018-01-26 ENCOUNTER — Encounter: Payer: Self-pay | Admitting: Physical Therapy

## 2018-01-26 ENCOUNTER — Other Ambulatory Visit: Payer: Self-pay

## 2018-01-26 ENCOUNTER — Ambulatory Visit: Payer: Self-pay | Admitting: Physical Therapy

## 2018-01-26 DIAGNOSIS — M25612 Stiffness of left shoulder, not elsewhere classified: Secondary | ICD-10-CM

## 2018-01-26 DIAGNOSIS — M25512 Pain in left shoulder: Principal | ICD-10-CM

## 2018-01-26 DIAGNOSIS — R252 Cramp and spasm: Secondary | ICD-10-CM

## 2018-01-26 DIAGNOSIS — R293 Abnormal posture: Secondary | ICD-10-CM

## 2018-01-26 DIAGNOSIS — G8929 Other chronic pain: Secondary | ICD-10-CM

## 2018-01-26 NOTE — Patient Instructions (Signed)
   Remember [place],  When doing shoulder exercises keep your thumbs up or out with all exercises and gym equipment.  Voncille Lo, PT Certified Exercise Expert for the Aging Adult  01/26/18 10:48 AM Phone: [phone] Fax: [phone]

## 2018-01-26 NOTE — Therapy (Signed)
[place], [place], [address] Phone: [phone]   Fax:  [phone]  Physical Therapy Treatment/Discharge Note  Patient Details  Name: [name] MRN: [id number] Date of Birth: 30-Apr-1965 Referring Provider (PT): Candice Camp MD   Encounter Date: 01/26/2018  PT End of Session - 01/26/18 1020    Visit Number  13    Date for PT Re-Evaluation  01/26/18    Authorization Type  CAFA  09/10/17 to 03/12/18    PT Start Time  1018    PT Stop Time  1100    PT Time Calculation (min)  42 min    Activity Tolerance  Patient tolerated treatment well;Patient limited by pain    Behavior During Therapy  [facility] for tasks assessed/performed       Past Medical History:  Diagnosis Date  . Anemia   . Cough   . Diverticulitis with perforation 2010  . DVT (deep vein thrombosis) in pregnancy 11/24/2016  . DVT, lower extremity ([place])   . Endometrial cancer ([place])   . Family history of adverse reaction to anesthesia    pts daughter had difficulty awakening following anesthesia   . GERD (gastroesophageal reflux disease)   . Headache   . History of bronchitis   . History of ear infections   . Hx of migraines   . Hypertension   . IBS (irritable bowel syndrome)   . Kidney infection   . Lupus (systemic lupus erythematosus) ([place]) 02/2017  . Morbid obesity ([place])   . Ovarian cancer ([place]) dx'd 01/2015  . Pyelonephritis     Past Surgical History:  Procedure Laterality Date  . ABDOMINAL HYSTERECTOMY N/A 03/13/2015   Procedure: TOTAL HYSTERECTOMY ABDOMINAL BILATERAL SALPINGO OOPHORECTOMY RADICAL TUMOR [place];  Surgeon: Everitt Amber, MD;  Location: [facility];  Service: Gynecology;  Laterality: N/A;  . BOWEL RESECTION  03/13/2015   Procedure: SMALL BOWEL RESECTION;  Surgeon: Everitt Amber, MD;  Location: [facility];  Service: Gynecology;;  . [place] and 1984  . COLOSTOMY  2008  . COLOSTOMY TAKEDOWN    . LAPAROTOMY N/A  03/13/2015   Procedure: EXPLORATORY LAPAROTOMY;  Surgeon: Everitt Amber, MD;  Location: [facility];  Service: Gynecology;  Laterality: N/A;  . TONSILLECTOMY AND ADENOIDECTOMY  1997  . VENTRAL HERNIA REPAIR  03/13/2015   Procedure: HERNIA REPAIR VENTRAL ADULT;  Surgeon: Everitt Amber, MD;  Location: [facility];  Service: Gynecology;;    There were no vitals filed for this visit.  Subjective Assessment - 01/26/18 1022    Subjective  I do feel better than I did.  I now have my exercises to do.  I like to do the wall exercise and the theraband exercise    Pertinent History  chronic kidney dz(Stage 3)  ovarian stage 3/ endometrial , recently with SLE, uses a cane due to right meniscus, obesity, See extensive medical history    Limitations  Lifting;House hold activities    How long can you sit comfortably?  unlimited    How long can you stand comfortably?  unlimted    How long can you walk comfortably?  unlimited    Diagnostic tests  ultrasound dx    Patient Stated Goals  I would like avoid surgery, I want to be able to put on my bra and comb my hair and sleep on my left side , ADL's    Currently in Pain?  Yes    Pain Score  6  Pain Location  Shoulder    Pain Orientation  Left    Pain Descriptors / Indicators  Aching;Sore   stiff   Pain Onset  More than a month ago    Pain Frequency  Intermittent         [facility] PT Assessment - 01/26/18 1027      Assessment   Medical Diagnosis  left chronic shoulder pain    Referring Provider (PT)  Candice Camp MD      Observation/Other Assessments   Focus on Therapeutic Outcomes (FOTO)   FOTO intake 59% limitation 41%  predicted 37%      AROM   Overall AROM   Deficits    Right Shoulder Extension  45 Degrees    Right Shoulder Flexion  150 Degrees    Right Shoulder ABduction  135 Degrees    Right Shoulder Internal Rotation  64 Degrees    Right Shoulder External Rotation  93 Degrees    Left Shoulder Flexion  121 Degrees    Left Shoulder ABduction  110  Degrees   with closed chain into wall   Left Shoulder Internal Rotation  45 Degrees   in supine   Left Shoulder External Rotation  70 Degrees   in supine not painful     PROM   Overall PROM   Deficits    Overall PROM Comments  painful with all motions on  left    Right Shoulder Flexion  157 Degrees    Right Shoulder ABduction  142 Degrees    Left Shoulder Flexion  24 Degrees    Left Shoulder ABduction  110 Degrees      Strength   Overall Strength  Deficits    Right Shoulder Flexion  5/5    Right Shoulder ABduction  5/5    Right Shoulder Internal Rotation  5/5    Right Shoulder External Rotation  5/5    Left Shoulder Flexion  4-/5    Left Shoulder ABduction  4-/5    Left Shoulder Internal Rotation  4-/5    Left Shoulder External Rotation  3-/5                   [facility] Adult PT Treatment/Exercise - 01/26/18 1033      Self-Care   Self-Care  Other Self-Care Comments    Other Self-Care Comments   use of tennis ball for self myofascial trigger points on shoulders  education on Pain neuro science    teres major and sub scapular     Shoulder Exercises: Supine   Other Supine Exercises  supine scapular stabilizers, with red t band, shold bil flex horizontal abd, diadonal and ER 2 x 10      Shoulder Exercises: Seated   Row  10 reps   x 2   Theraband Level (Shoulder Row)  Level 2 (Red)      Shoulder Exercises: Standing   Flexion  10 reps;Left   2 lb barbell no pain   Flexion Limitations  both towel slide   15   Other Standing Exercises  wall push ups x 10     Other Standing Exercises  ball on wall 10 x each circles, press flexion,,  side to side   shoulder height,  no pain increase but started at 7/10     Shoulder Exercises: ROM/Strengthening   UBE (Upper Arm Bike)  L 1 2 min forward and 2 min backward      Shoulder Exercises: Stretch   Other  Shoulder Stretches  single and double doorways stretch added to HEP             PT Education - 01/26/18 1103     Education Details  Pt educated on pain neuroscience and given tennis ball and demo of self myofascial release of trigger points in shoulder, Reviewed HEP    Person(s) Educated  Patient    Methods  Explanation;Demonstration    Comprehension  Verbalized understanding;Returned demonstration       PT Short Term Goals - 12/22/17 1317      PT SHORT TERM GOAL #1   Title  Pt will be independent with initial HEP    Time  3    Period  Weeks    Status  Achieved      PT SHORT TERM GOAL #2   Title  Pt will be able to use UE to increase AROM to at least 100 degrees flexion in order to lift items in kitch to shelf at shoulder height    Baseline  119 12-22-17    Time  3    Period  Weeks    Status  Achieved      PT SHORT TERM GOAL #3   Title  "Demonstrate understanding of proper sitting posture, body mechanics, work ergonomics, and be more conscious of position and posture throughout the day.     Baseline  demonstrates more awareness in clinic    Time  3    Period  Weeks    Status  Achieved        PT Long Term Goals - 01/26/18 1025      PT LONG TERM GOAL #1   Title  "Pt will be independent with advanced HEP.     Baseline  Best with closed chain.  Pt independent and given copies of all exercises    Time  8    Period  Weeks    Status  Achieved      PT LONG TERM GOAL #2   Title  left  shoulder IR and ER will return to [facility] to return to pain-free ADLs such as dressing and grooming.     Baseline  Pt is able to bathe but still has soreness trying to put on bra,  left IR 45 and ER 70    Time  8    Period  Weeks    Status  Partially Met      PT LONG TERM GOAL #3   Title  "FOTO will improve from  66%limitation   to  37% limitation    indicating improved functional mobility .     Baseline  01-26-18 41%    Time  6    Period  Weeks    Status  Partially Met      PT LONG TERM GOAL #4   Title  left shoulder AROM scaption will improve to 0-150degrees for improved overhead reaching.     Baseline   118 scaption Pt still with global inflammation , will see rheumatologist improved from eval    Time  8    Period  Weeks    Status  Not Met      PT LONG TERM GOAL #5   Title  Pt will be able to sleep on left side at night with 50% greater comfort for more restorative sleep at night    Baseline  can sleep on shoulder but sometimes wakes up sore     Time  8    Period  Weeks    Status  Achieved            Plan - 01/26/18 1308    Clinical Impression Statement  Ms. Dubreuil has attended 13 sessions and reviewed HEP for home use.  Pt is going to see a rheumatologist for ongoing inflammatory issues that may be affecting shoulder as well.  Pt FOTO 41 % limitation today. only 1 % better from 2 weeks ago  Pt seems to do better with closed chain exercises and HEP was reviewed. Pt has left ER of 70 and IR of 45, 121 shld fex and 110 abduction Pt is able to wash and bathe herself but has trouble getting left UE over AROM stated due to 7/10 pain. Pt  did not meet 150 flexion goal due to pain. All other LTG are met or partially met.  Pt /PT discussed value of continuing PT.  Pt goals mostly achieved but she would like to control pain better by being assessed by rheumatologist.    PT Frequency  2x / week    PT Duration  6 weeks    PT Treatment/Interventions  ADLs/Self Care Home Management;Cryotherapy;Electrical Stimulation;Iontophoresis 3m/ml Dexamethasone;Moist Heat;Ultrasound;Therapeutic exercise;Therapeutic activities;Neuromuscular re-education;Patient/family education;Manual techniques;Passive range of motion;Dry needling;Taping    PT Next Visit Plan  DC today    PT Home Exercise Plan  supine cane flexion and ER , Table slides.  lat pull downs and Bil ER supine scapular stabiizers,  standing closed chain exercises reduces pain ,  shoulder door stretch    Consulted and Agree with Plan of Care  Patient       Patient will benefit from skilled therapeutic intervention in order to improve the  following deficits and impairments:  Pain, Postural dysfunction, Improper body mechanics, Decreased range of motion, Decreased strength, Increased fascial restricitons, Increased muscle spasms, Impaired flexibility, Impaired UE functional use  Visit Diagnosis: Chronic left shoulder pain  Stiffness of left shoulder, not elsewhere classified  Abnormal posture  Cramp and spasm     Problem List Patient Active Problem List   Diagnosis Date Noted  . Left shoulder pain 11/11/2017  . Medication refill 10/09/2017  . Enlarged lymph nodes 08/13/2017  . Cramping of hands 12/31/2016  . Fatigue 11/13/2016  . Elevated antinuclear antibody (ANA) level 09/23/2016  . Eczema 01/27/2016  . Portacath in place 09/12/2015  . Long term current use of anticoagulant therapy 09/06/2015  . Slow rate of speech 08/22/2015  . Chemotherapy[place]induced peripheral neuropathy ([place] 06/13/2015  . CKD (chronic kidney disease) stage 3, GFR 30-59 ml/min (HCC) 05/12/2015  . Esophageal reflux 05/12/2015  . Ovarian carcinosarcoma, right ([place] 03/29/2015  . Endometrial ca ([place] 03/29/2015  . Ventral hernia without obstruction or gangrene   . Morbid obesity with BMI of 60.0-69.9, adult ([place] 02/19/2015  . Essential hypertension, benign   . Stress at home 12/14/2014  . Left knee pain 08/04/2013  . Seasonal allergies 07/29/2013  . Right knee pain 11/28/2011    LVoncille Lo PT Certified Exercise Expert for the Aging Adult  01/26/18 1:23 PM Phone: [phone]: [address] TrailC[facility]GT[address] [place] [address]Phone: [phone]  Fax:  [phone] Name: RNICOLINA HIRTMRN: [id number] of Birth: [birth year]  PHYSICAL THERAPY DISCHARGE SUMMARY  Visits from Start of Care:13  Current functional level related to goals / functional outcomes: As above   Remaining deficits: Limited left flex and abd as above due to pain    Education /  Equipment: HEP Plan: Patient agrees to discharge.  Patient goals were partially met. Patient is being discharged due to lack of progress.  ?????    And pt pursuing further medical workup with rheumatology. Pt is independent with HEP  Voncille Lo, PT Certified Exercise Expert for the Aging Adult  01/26/18 1:23 PM Phone: [phone] Fax: [phone]

## 2018-01-28 ENCOUNTER — Ambulatory Visit: Payer: Self-pay | Admitting: Physical Therapy

## 2018-02-01 ENCOUNTER — Encounter (HOSPITAL_COMMUNITY): Payer: Self-pay | Admitting: *Deleted

## 2018-02-01 NOTE — Progress Notes (Signed)
Letter mailed to patient with negative Fit Test results.  

## 2018-02-03 ENCOUNTER — Ambulatory Visit (INDEPENDENT_AMBULATORY_CARE_PROVIDER_SITE_OTHER): Payer: Self-pay | Admitting: Sports Medicine

## 2018-02-03 ENCOUNTER — Telehealth: Payer: Self-pay | Admitting: Family Medicine

## 2018-02-03 VITALS — BP 121/57 | Ht 65.5 in | Wt 376.0 lb

## 2018-02-03 DIAGNOSIS — M791 Myalgia, unspecified site: Secondary | ICD-10-CM

## 2018-02-03 DIAGNOSIS — R768 Other specified abnormal immunological findings in serum: Secondary | ICD-10-CM

## 2018-02-03 DIAGNOSIS — M7582 Other shoulder lesions, left shoulder: Secondary | ICD-10-CM

## 2018-02-03 NOTE — Progress Notes (Addendum)
   HPI  CC: Left shoulder pain [name] is a 52 year old female who presents for follow-up of left shoulder pain.  She was last seen on 11/18/2017.  At that time she is given a subacromial steroid injection.  She states this did give her great relief.  She is been doing physical therapy since that time as well.  She states that she has increased range of motion in her shoulder as well as has had improvement in her pain.  She states she still has pain which across of her body.  She still has pain when she is driving a car.  She still has some pain with overhead activity.  She states is painful when she lies over affected side at nighttime.  The pain is located near her [facility] joint.  She has no new trauma to the area.  She denies any numbness tingling in her arm.  She denies any weakness of the arm.  She is been taken Tylenol as needed for pain.  Of note, patient was recently diagnosed with lupus.  She has been set up with rheumatology appointment later this month.  She is not currently on any medications for this.  She does report some joint pain in her fingers as well as in her other shoulder, but not as bad as her left shoulder.  See HPI and/or previous note for associated ROS.  Objective: BP (!) 121/57   Ht 5' 5.5" (1.664 m)   Wt (!) 376 lb (170.6 kg)   LMP 10/26/2013   BMI 61.62 kg/m  Gen: Right-Hand Dominant. NAD, well groomed, a/o x3, normal affect.  CV: Well-perfused. Warm.  Resp: Non-labored.  Neuro: Sensation intact throughout. No gross coordination deficits.  Gait: Nonpathologic posture, unremarkable stride without signs of limp or balance issues.  Left shoulder exam: No erythema, warmth, swelling noted.  Tenderness palpation over the [facility] joint.  Tenderness palpation of the bicipital groove.  Range of motion full in forward flexion, range motion limited to around 120 degrees in abduction, full range of motion internal and external rotation.  Strength 5 out of 5 throughout testing.  Negative  speeds test, positive empty can test, positive crossover test, negative belly liftoff test, negative Hawkins test.  Assessment and plan: Left shoulder pain, likely secondary to [facility] joint pathology with an element of supraspinatus tendinopathy still.  We discussed treatment options today with [place].  We discussed that she cannot get a subacromial injection today's visit due to the being less than 3 months.  We did discuss which she could get an [facility] joint injection today.  We discussed that this could also be secondary to some autoimmune disorder she has in her joints.  She said to see rheumatology later this month.  She has elected to wait until her appointment with rheumatology to make further decisions about injection therapy.  We will see her in 2 months for follow-up after she has been set up with rheumatology.  If at that time she still has [facility] joint pathology, we can consider doing AC versus a subacromial injection.  Lewanda Rife, MD [place] [facility] 02/03/2018 12:37 PM   I was the preceptor for this visit and available for immediate consultation. Lilia Argue, DO

## 2018-02-03 NOTE — Telephone Encounter (Signed)
Pt would like a new referral placed for rheumatology. She would like to go to [facility] since the have sliding fee. jw

## 2018-02-03 NOTE — Telephone Encounter (Signed)
I thought this was already done.  I will put it in referral to [place]

## 2018-02-12 MED FILL — XARELTO 20 MG TABLET: 20 | 30 days supply | Qty: 30 | Fill #1

## 2018-02-17 ENCOUNTER — Other Ambulatory Visit: Payer: Self-pay | Admitting: Family Medicine

## 2018-02-17 MED ORDER — FESOTERODINE FUMARATE ER 4 MG PO TB24: 8.0000 mg | ORAL_TABLET | Freq: Every day | ORAL | 1 refills | Status: DC

## 2018-03-08 ENCOUNTER — Telehealth: Payer: Self-pay

## 2018-03-10 ENCOUNTER — Inpatient Hospital Stay: Payer: Medicaid Other | Attending: Obstetrics and Gynecology

## 2018-03-10 DIAGNOSIS — Z90710 Acquired absence of both cervix and uterus: Secondary | ICD-10-CM | POA: Insufficient documentation

## 2018-03-10 DIAGNOSIS — Z90722 Acquired absence of ovaries, bilateral: Secondary | ICD-10-CM | POA: Insufficient documentation

## 2018-03-10 DIAGNOSIS — C541 Malignant neoplasm of endometrium: Secondary | ICD-10-CM | POA: Diagnosis present

## 2018-03-10 DIAGNOSIS — Z9221 Personal history of antineoplastic chemotherapy: Secondary | ICD-10-CM | POA: Diagnosis not present

## 2018-03-10 DIAGNOSIS — C569 Malignant neoplasm of unspecified ovary: Secondary | ICD-10-CM

## 2018-03-10 LAB — BASIC METABOLIC PANEL
Anion gap: 6 (ref 5–15)
BUN: 22 mg/dL — AB (ref 6–20)
CALCIUM: 9.1 mg/dL (ref 8.9–10.3)
CHLORIDE: 107 mmol/L (ref 98–111)
CO2: 28 mmol/L (ref 22–32)
Creatinine, Ser: 1.26 mg/dL — ABNORMAL HIGH (ref 0.44–1.00)
GFR calc Af Amer: 56 mL/min — ABNORMAL LOW (ref >60)
GFR, EST NON AFRICAN AMERICAN: 49 mL/min — AB (ref >60)
Glucose, Bld: 91 mg/dL (ref 70–99)
Potassium: 4.7 mmol/L (ref 3.5–5.1)
SODIUM: 141 mmol/L (ref 135–145)

## 2018-03-11 LAB — CA 125: Cancer Antigen (CA) 125: 3 U/mL (ref 0.0–38.1)

## 2018-03-12 ENCOUNTER — Encounter (HOSPITAL_COMMUNITY): Payer: Self-pay

## 2018-03-12 ENCOUNTER — Ambulatory Visit (HOSPITAL_COMMUNITY)
Admission: RE | Admit: 2018-03-12 | Discharge: 2018-03-12 | Disposition: A | Discharge status: Home / Self Care | Payer: Medicaid Other | Source: Ambulatory Visit | Attending: Gynecologic Oncology | Admitting: Gynecologic Oncology

## 2018-03-12 ENCOUNTER — Other Ambulatory Visit: Payer: Self-pay | Admitting: Family Medicine

## 2018-03-12 DIAGNOSIS — C561 Malignant neoplasm of right ovary: Secondary | ICD-10-CM

## 2018-03-12 DIAGNOSIS — C569 Malignant neoplasm of unspecified ovary: Secondary | ICD-10-CM | POA: Diagnosis present

## 2018-03-12 MED ORDER — HEPARIN SOD (PORK) LOCK FLUSH 100 UNIT/ML IV SOLN
INTRAVENOUS | Status: AC
  Filled 2018-03-12: qty 5

## 2018-03-12 MED ORDER — IOHEXOL 300 MG/ML  SOLN
150.0000 mL | Freq: Once | INTRAMUSCULAR | Status: AC | PRN
  Administered 2018-03-12: 125 mL via INTRAVENOUS

## 2018-03-12 MED ORDER — HEPARIN SOD (PORK) LOCK FLUSH 100 UNIT/ML IV SOLN
500.0000 [IU] | Freq: Once | INTRAVENOUS | Status: AC
  Administered 2018-03-12: 500 [IU] via INTRAVENOUS

## 2018-03-12 MED ORDER — SODIUM CHLORIDE (PF) 0.9 % IJ SOLN
INTRAMUSCULAR | Status: AC
  Filled 2018-03-12: qty 50

## 2018-03-16 ENCOUNTER — Telehealth: Payer: Self-pay

## 2018-03-16 ENCOUNTER — Telehealth: Payer: Self-pay | Admitting: *Deleted

## 2018-03-16 DIAGNOSIS — M349 Systemic sclerosis, unspecified: Secondary | ICD-10-CM

## 2018-03-16 NOTE — Telephone Encounter (Signed)
Please call Ms [name] and let her know that unfortunately she would still need to be followed by Rheumatology for this.  I don't actually know about [facility] and financial assistance.  It would probably be best to call their office and ask.    If they do not, then I suggest she continue with [facility].    Thanks!

## 2018-03-16 NOTE — Telephone Encounter (Signed)
Told [name] that the CT showed no evidence of recurrent or metastatic disease per Alison John, NP. Pt needs to keep her f/u appointment 03-19-18 with Dr. Denman Thompson as scheduled. Pt verbalized understanding.

## 2018-03-16 NOTE — Telephone Encounter (Signed)
Spoke to Pt. She has spoken with [facility] and they do take the orange card. Pt needs a referral to Dr. Wilmon Pali. Please let pt know when this has been done. Ottis Stain, CMA

## 2018-03-16 NOTE — Telephone Encounter (Signed)
Pt states that she went to [facility] and was dx with scleroderma lupus.  She is wondering since she now has a dx can she be seen by someone in [facility].  So she can use her financial assistance. Zekiah Caruth, Salome Spotted, CMA

## 2018-03-17 NOTE — Telephone Encounter (Signed)
Done

## 2018-03-19 ENCOUNTER — Inpatient Hospital Stay (HOSPITAL_BASED_OUTPATIENT_CLINIC_OR_DEPARTMENT_OTHER): Payer: Medicaid Other | Admitting: Gynecologic Oncology

## 2018-03-19 ENCOUNTER — Encounter: Payer: Self-pay | Admitting: Gynecologic Oncology

## 2018-03-19 VITALS — BP 157/76 | HR 76 | Temp 97.4°F | Resp 18 | Ht 65.0 in | Wt 383.0 lb

## 2018-03-19 DIAGNOSIS — Z90710 Acquired absence of both cervix and uterus: Secondary | ICD-10-CM

## 2018-03-19 DIAGNOSIS — C541 Malignant neoplasm of endometrium: Secondary | ICD-10-CM

## 2018-03-19 DIAGNOSIS — Z90722 Acquired absence of ovaries, bilateral: Secondary | ICD-10-CM

## 2018-03-19 DIAGNOSIS — C569 Malignant neoplasm of unspecified ovary: Secondary | ICD-10-CM | POA: Diagnosis not present

## 2018-03-19 DIAGNOSIS — C561 Malignant neoplasm of right ovary: Secondary | ICD-10-CM

## 2018-03-19 MED FILL — XARELTO 20 MG TABLET: 20 | 30 days supply | Qty: 30 | Fill #2

## 2018-03-19 NOTE — Progress Notes (Signed)
Followup of Consult: Gyn-Onc  CC:  Chief Complaint  Patient presents with  . Ovarian Carcinosarcoma    follow up  . Endometrial Cancer    Assessment/Plan:  [name]  is a 52 y.o.  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  She is s/p exploratory laparotomy and TAH, BSO, optimal cytoreduction to no residual visible disease on 03/13/15. S/p 6 cycles of adjuvant carboplatin and paclitaxel chemotherapy completed 08/02/15. Genetic testing negative.  Hx of PE in May, 2017, with recurrent DVT and PE in October, 2018 - on Lovenox. Diagnosis with Lupus, awaiting therapy for this.   Complete response to treatment. No evidence of recurrence.  CA 125 normal in December, 2019  Given that her tumor marker (CA 125) was never elevated, will check CT scan in December, 2020.  HPI: [name] is a very pleasant 52 year old woman with history of 2 prior cesarean sections who is seen initially in the hospital as an inpatient consultation at [facility] on 02/19/2015 for complex pelvic masses. The patient is a morbidly obese woman with a BMI 62 kg/m. She was admitted to [facility] on 02/18/2015 with pyelonephritis. CT imaging and then MRI imaging was performed to evaluate for an etiology for pyelonephritis. This identified A large pelvic mass which it to be arising from the right adnexa. It measured 13 x 9 x 17.5 cm and was irregular cystic and solid in nature. A second, but cystic and solid lesion within the left adnexa measuring 4.4 x 6.5 x 5.3 cm. In the third cystic and solid mass was seen anterior to the uterus and superior to the bladder measuring 4.2 x 5.4 x 5.3 cm. She had no pelvic lymphadenopathy, no carcinomatosis, and CA 125 was normal at 21U/mL.  Her infection was adequately treated with IV antibiotics and her acute kidney injury which manifested with an elevated creatinine 12.4 spontaneously resolved during her hospital  stay with avoidance of NSAID and good hydration. She was noted to be anemic during her hospitalization with a hemoglobin of 10.8. However this did not require transfusion and spontaneously improved over time.   On 03/13/2015 she underwent an exploratory laparotomy TAH/BSO and radical tumor debulking for a stage IIIc carcinosarcoma of the right ovary and an incidentally found synchronous stage I a endometrial cancer. Tumor was found with a large right ovarian mass, smaller left ovarian mass and a carcinosarcoma implant was involving the anterior cul-de-sac between the bladder and lower uterine segment. All disease sites were completely resected with no macroscopic disease Rainey at the completion of debulking.   She went on to receive 6 cycles of adjuvant chemotherapy with carboplatin and paclitaxel between 04/12/15 and 08/02/15. She tolerated treatment well with some neutropenia. CA 125 was normal at 4.1 at completion of therapy in March, 2017. On 08/23/15 she underwent post-treatment CT imaging which showed: there is a 2.5 x 1.6 cm soft tissue nodule near the vaginal cuff. Within the right hemipelvis there is a 1.8 cm soft tissue nodule.   Repeat CT on 11/21/15 showed that the small bilateral deep pelvic side wall soft tissue masses have decreased in size and are less suggestive of persistent/recurrent disease.  She has a stable small left ventral hernia.    CA 125 in October, 2017 normal.  CT abdo/pelvis in May, 2018 showed no evidence of recurrent disease and CA 125 in May, 2018 was normal at 3.4.  In September, 2018 was normal at 3.  In October 2018 she was hospitalized with a left lower extremity DVT and PE. She was started on Lovenox. CT chest showed no lesions consistent with metastatic cancer.  CA 125 in December, 2018 normal at 3.   CA 125 in December, 2019 was normal at 3.5. CT abd/pelvis on 03/12/18 showed no evidence of recurrent disease.   Interval History:  She was diagnosed with  Lupus and is starting treatment in January, 2020.   Current Meds:  Outpatient Encounter Medications as of 03/19/2018  Medication Sig  . acetaminophen (TYLENOL) 500 MG tablet Take 1,000 mg by mouth every 6 (six) hours as needed for mild pain, moderate pain, fever or headache. Reported on 09/10/2015  . cetirizine (ZYRTEC) 10 MG tablet Take 1 tablet (10 mg total) by mouth daily as needed for allergies (itching).  . fesoterodine (TOVIAZ) 4 MG TB24 tablet Take 2 tablets (8 mg total) by mouth daily.  . furosemide (LASIX) 20 MG tablet Take 1 tab by mouth daily x 1 week, then PRN daily after that for leg swelling  . gabapentin (NEURONTIN) 100 MG capsule Take 100 mg by mouth at bedtime as needed.   . Iron-Vitamins (GERITOL COMPLETE) TABS Take 1 tablet by mouth daily.  Marland Kitchen lidocaine-prilocaine (EMLA) cream Apply to PAC site 1-2 hours prior to access as needed.  . Oral Electrolytes ([employer] ELECTROLYTE SOLUTION PO) Take 20-40 drops by mouth daily. Place 20-40 drops in liquid daily  . pantoprazole (PROTONIX) 40 MG tablet TAKE 1 TABLET BY MOUTH ONCE A DAY  . rivaroxaban (XARELTO) 20 MG TABS tablet Take 1 tablet (20 mg total) by mouth daily with supper.  . triamcinolone cream (KENALOG) 0.1 % APPLY TO THE AFFECTED AREA(S) TWICE DAILY AS NEEDED   Facility-Administered Encounter Medications as of 03/19/2018  Medication  . sodium chloride flush (NS) 0.9 % injection 10 mL  . sodium chloride flush (NS) 0.9 % injection 10 mL    Allergy:  Allergies  Allergen Reactions  . Penicillins Rash    Has patient had a PCN reaction causing immediate rash, facial/tongue/throat swelling, SOB or lightheadedness with hypotension: no Has patient had a PCN reaction causing severe rash involving mucus membranes or skin necrosis: no Has patient had a PCN reaction that required hospitalization in the hospital at the time Has patient had a PCN reaction occurring within the last 10 years: no If all of the above answers are "NO", then  may proceed with Cephalosporin use.     Social Hx:   Social History   Socioeconomic History  . Marital status: Legally Separated    Spouse name: Not on file  . Number of children: Not on file  . Years of education: Not on file  . Highest education level: Not on file  Occupational History  . Not on file  Social Needs  . Financial resource strain: Not on file  . Food insecurity:    Worry: Not on file    Inability: Not on file  . Transportation needs:    Medical: Not on file    Non-medical: Not on file  Tobacco Use  . Smoking status: Never Smoker  . Smokeless tobacco: Never Used  Substance and Sexual Activity  . Alcohol use: Yes    Comment: Maybe wine every 6 months  . Drug use: No  . Sexual activity: Yes    Comment: Told she could not have more children in 1986  Lifestyle  . Physical activity:    Days per week: Not on file  Minutes per session: Not on file  . Stress: Not on file  Relationships  . Social connections:    Talks on phone: Not on file    Gets together: Not on file    Attends religious service: Not on file    Active member of club or organization: Not on file    Attends meetings of clubs or organizations: Not on file    Relationship status: Not on file  . Intimate partner violence:    Fear of current or ex partner: Not on file    Emotionally abused: Not on file    Physically abused: Not on file    Forced sexual activity: Not on file  Other Topics Concern  . 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   Married x 23 years, recently separated (4/15).  No domestic violence.  + financial stress.     Lives previously with husband who moved out, now with daughter who has medical problems, including Hodgkin's disease, and granddaughter (age 41).     Uses seat belt.     Past Surgical Hx:  Past Surgical History:  Procedure Laterality  Date  . ABDOMINAL HYSTERECTOMY N/A 03/13/2015   Procedure: TOTAL HYSTERECTOMY ABDOMINAL BILATERAL SALPINGO OOPHORECTOMY RADICAL TUMOR [place];  Surgeon: Everitt Amber, MD;  Location: [facility];  Service: Gynecology;  Laterality: N/A;  . BOWEL RESECTION  03/13/2015   Procedure: SMALL BOWEL RESECTION;  Surgeon: Everitt Amber, MD;  Location: [facility];  Service: Gynecology;;  . [place] and 1984  . COLOSTOMY  2008  . COLOSTOMY TAKEDOWN    . LAPAROTOMY N/A 03/13/2015   Procedure: EXPLORATORY LAPAROTOMY;  Surgeon: Everitt Amber, MD;  Location: [facility];  Service: Gynecology;  Laterality: N/A;  . TONSILLECTOMY AND ADENOIDECTOMY  1997  . VENTRAL HERNIA REPAIR  03/13/2015   Procedure: HERNIA REPAIR VENTRAL ADULT;  Surgeon: Everitt Amber, MD;  Location: [facility];  Service: Gynecology;;    Past Medical Hx:  Past Medical History:  Diagnosis Date  . Anemia   . Cough   . Diverticulitis with perforation 2010  . DVT (deep vein thrombosis) in pregnancy 11/24/2016  . DVT, lower extremity ([place])   . Endometrial cancer ([place])   . Family history of adverse reaction to anesthesia    pts daughter had difficulty awakening following anesthesia   . GERD (gastroesophageal reflux disease)   . Headache   . History of bronchitis   . History of ear infections   . Hx of migraines   . Hypertension   . IBS (irritable bowel syndrome)   . Kidney infection   . Lupus (systemic lupus erythematosus) ([place]) 02/2017  . Morbid obesity ([place])   . Ovarian cancer ([place]) dx'd 01/2015  . Pyelonephritis     Past Gynecological History:  C/s x 2  Patient's last menstrual period was 10/26/2013.  Family Hx:  Family History  Problem Relation Age of Onset  . Diabetes Mother   . Hypertension Mother   . Hyperlipidemia Mother   . Colon polyps Mother        4 total  . Fibroids Mother        s/p hysterectomy  . Hyperlipidemia Father   . Diabetes Daughter   . Hodgkin's lymphoma Daughter 53  . Asthma Maternal Aunt        severe - d. 55   .  Prostate cancer Maternal Uncle 65  . Diabetes Paternal Aunt   . Diabetes Paternal Uncle   . Kidney failure Maternal Grandmother 84  . Pancreatic cancer Paternal Grandmother        dx. >50  . Diabetes Paternal Grandfather   . Diabetes Paternal Aunt   . Pancreatic cancer Cousin 59       paternal 1st cousin  . Heart disease Neg Hx   . Stroke Neg Hx     Review of Systems:  Constitutional  Feels well,    ENT Normal appearing ears and nares bilaterally Skin/Breast  + lupus rash on face Cardiovascular  No chest pain, shortness of breath, or edema  Pulmonary  No cough or wheeze.  Gastro Intestinal  No nausea, vomitting, or diarrhoea. No bright red blood per rectum, nochange in bowel movement, or constipation.  Genito Urinary  No frequency, urgency, dysuria,  Musculo Skeletal  + bilateral knee pain with ambulation, + shoulder pain Neurologic  No weakness, numbness, change in gait,  Psychology  No depression, anxiety, insomnia.   Vitals:  Blood pressure (!) 157/76, pulse 76, temperature (!) 97.4 F (36.3 C), temperature source Oral, resp. rate 18, height 5\' 5"  (1.651 m), weight (!) 383 lb (173.7 kg), last menstrual period 10/26/2013, SpO2 100 %.  Physical Exam: WD in NAD Neck  Supple NROM, without any enlargements.  Lymph Node Survey No cervical supraclavicular or inguinal adenopathy Cardiovascular  Pulse normal rate, regularity and rhythm. S1 and S2 normal.  Lungs  Clear to auscultation bilateraly, without wheezes/crackles/rhonchi. Good air movement.  Skin  + facial rash consistent with lupus Psychiatry  Alert and oriented to person, place, and time  Abdomen  Normoactive bowel sounds, abdomen soft, non-tender and obese without palpable evidence of hernia. There is a large midline incision which is healed. Back No CVA tenderness Genito Urinary  Vulva/vagina: Normal external female genitalia.  No lesions. No discharge or bleeding.  Bladder/urethra:  No lesions or  masses, well supported bladder  Vagina: cuff in tact.  Cervix and uterus: surgically absent  Adnexa: no palpable masses. Rectal  Good tone, no masses no cul de sac nodularity.  Extremities  No bilateral cyanosis, clubbing or edema.   Thereasa Solo, MD  03/19/2018, 2:20 PM

## 2018-03-19 NOTE — Patient Instructions (Signed)
Please notify Dr Kyrillos Adams at phone number [phone] if you notice vaginal bleeding, new pelvic or abdominal pains, bloating, feeling full easy, or a change in bladder or bowel function.  Please return to see Dr Janautica Netzley in 6 months.  

## 2018-03-23 ENCOUNTER — Other Ambulatory Visit: Payer: Self-pay | Admitting: Family Medicine

## 2018-04-08 ENCOUNTER — Encounter: Payer: Self-pay | Admitting: Family Medicine

## 2018-04-08 ENCOUNTER — Ambulatory Visit (INDEPENDENT_AMBULATORY_CARE_PROVIDER_SITE_OTHER): Payer: Medicaid Other | Admitting: Family Medicine

## 2018-04-08 ENCOUNTER — Ambulatory Visit: Payer: Medicaid Other | Admitting: Licensed Clinical Social Worker

## 2018-04-08 ENCOUNTER — Ambulatory Visit: Payer: Medicaid Other

## 2018-04-08 ENCOUNTER — Other Ambulatory Visit: Payer: Self-pay

## 2018-04-08 DIAGNOSIS — M25474 Effusion, right foot: Secondary | ICD-10-CM

## 2018-04-08 DIAGNOSIS — Z789 Other specified health status: Secondary | ICD-10-CM

## 2018-04-08 MED ORDER — PREDNISONE 10 MG PO TABS: 30.0000 mg | ORAL_TABLET | Freq: Every day | ORAL | 0 refills | Status: DC

## 2018-04-08 NOTE — Assessment & Plan Note (Signed)
Swelling, warmth, erythema noted to dorsal aspect of head of second and third metatarsal in the absence of trauma consistent with possible gout with history of CKD.  Will prescribe prednisone course given inability to take NSAIDs.  Follow-up if no better in 1 week, consider possibility of stress fracture although not high likelihood.

## 2018-04-08 NOTE — Patient Instructions (Signed)
It was great to see you!  Our plans for today:  - We are prescribing prednisone for your symptoms. Take this until 2 days after your symptoms resolved. If you aren't feeling better by this time next week, come back to be seen.   Take care and seek immediate care sooner if you develop any concerns.   Dr. [facility]

## 2018-04-08 NOTE — Progress Notes (Signed)
  Subjective:   Patient ID: [name]    DOB: 1966/02/13, 53 y.o. female   MRN: [id number]  [name] is a 53 y.o. female with a history of HTN, GERD, R ovarian carcinosarcomain remission, eczema, CKD3, morbid obesity, seasonal allergies, h/o PE, scleroderma lupus here for   Swelling of foot Patient states the night before last she noticed swelling of her second and third toes on her right foot and had difficulty moving her toes.  She woke up around 2 AM to use the bathroom and felt significant pain when walking to the bathroom.  Since then her pain has gotten progressively worse and now hurts whenever she walks.  She states this is never happened before.  Also notes erythema and swelling to the top of her foot.  Denies any known trauma.  Takes gabapentin for neuropathy but states this pain feels different.  Took some Tylenol but did not help.  She is unable to take NSAIDs because of her CKD.  Now notes the top of her foot is red.  Review of Systems:  Per HPI.  [place], medications and smoking status reviewed.  Objective:   BP 120/70   Pulse 67   Temp 97.6 F (36.4 C) (Oral)   Ht 5\' 5"  (1.651 m)   Wt (!) 383 lb (173.7 kg)   LMP 10/26/2013   SpO2 99%   BMI 63.73 kg/m  Vitals and nursing note reviewed.  General: Obese female, in no acute distress with non-toxic appearance CV: trace lower extremity edema Skin: warm, dry.  Swelling, warmth and erythema noted to dorsal aspect of head of second and third metatarsal with surrounding swelling of second and third toes.  Tender to palpation over head of second and third metatarsals.  Assessment & Plan:   Swelling of foot joint, right Swelling, warmth, erythema noted to dorsal aspect of head of second and third metatarsal in the absence of trauma consistent with possible gout with history of CKD.  Will prescribe prednisone course given inability to take NSAIDs.  Follow-up if no better in 1 week, consider possibility of stress  fracture although not high likelihood.  No orders of the defined types were placed in this encounter.  Meds ordered this encounter  Medications  . predniSONE (DELTASONE) 10 MG tablet    Sig: Take 3 tablets (30 mg total) by mouth daily with breakfast. Until 2 days after symptoms resolve.    Dispense:  21 tablet    Refill:  0   Precepted with Dr. McDiarmid.  Rory Percy, DO PGY-2, [facility] 04/08/2018 4:36 PM

## 2018-04-09 ENCOUNTER — Telehealth: Payer: Self-pay | Admitting: Family Medicine

## 2018-04-09 ENCOUNTER — Telehealth: Payer: Self-pay | Admitting: Licensed Clinical Social Worker

## 2018-04-09 NOTE — Progress Notes (Signed)
Service:  Clinical Social Work  LCSW met with [facility] Kennyth Lose, [facility] referral coordinator to obtain an understanding of referral sent and denied for patient.  Referral sent to various rheumatologist and denied due to not taking the [facility] card or financial assistance.  Kennyth Lose will talk with PCP to see if there are other provider options that might be able to treat patient.  TC to patient to provide an update and explore other options.  Patient explored [facility] financial assistance, however they are not taking new patients at this time.  Patient does not qualify for ACA due to having no income.  Patient appreciative of assistance and update.   [name], [facility]   [phone] 9:47 AM

## 2018-04-09 NOTE — BH Specialist Note (Signed)
Integrated Behavioral Health Follow Up Visit  MRN: [id number] Name: [name]  Session Start time: 2:45  Session End time: 3:00 Total time: 15 minutes  Reason for follow-up: Continue brief intervention to assist patient with managing stressors and obtaining resources to address medical concerns.  Report : stress related to difficulty with getting appointment for financial assistance so that she can go to specialist;  Reports receiving new diagnosis of scleroderma and is unable to be treated by rheumatologist without financial assistance.   ASSESSMENT:  Affect: Appropriate;Thought process: Coherent;  No plan to harm self or others Patient is making progress with managing stressors related to living with parents, health and family stressors. Patient may benefit from assistance with financial assistance process.  LCSW provided patient with a copy of the application and steps of completing and address to mail in her information.  GOALS:Patient will: 1. Reduce symptoms of: stress 2. Increase knowledge and/or ability of: coping skills, self-management skills and stress reduction  3.   Obtain Financial Assistance 4.   Get referral from PCP to  Rheumatology 5.   Obtain disability (working with Chief Executive Officer) PLAN:  1.Patient will F/U with [facility] as needed for additional assistance and resources 2.Patient will complete financial assistance application and mail it in. Intervention: Solution-Focused Strategies, Supportive Counseling and Link to [employer], Reflective listening,  Problem-solving teaching/coping strategies  GAD-7=5, indication of: mild anxiety.   [name], [facility]   [phone] 8:46 AM

## 2018-04-09 NOTE — Telephone Encounter (Signed)
Pt has been trying to see a Rheumatology doctor, She does not have insurance and no one excepts the orange card or the financial aide through [facility]. Is there other type of doctor that could treat her. jw

## 2018-04-12 NOTE — Telephone Encounter (Signed)
Contacted pt to give her the below information and she was very frustrated with everything that has gone on, she stated that she just recently learned that her referral to a [facility] doctor had been denied and that she was not notified of this.  Patient stated that she had to learn this from her social worker due to no one else informing her.  She stated that this has been going on since 2018 and that she feels like the communication has not been what it should. She also stated that she has renewed her orange card and is in the process of submitting her application for financial assistance with [facility].  She stated that [facility] no longer accepts the orange card and they are not doing financial assistance, and she was told they would not be able to see her anymore. I told pt I would send this information to PCP to see if there is anything else pt can do either now or once she has received the financial assistance.  She is also in the process of checking around for doctors that may take the orange card and FA through [facility]. Pt is very appreciative of any help/advice that can be given on her behalf as she continues to work on her behalf as well.  Also after speaking to Janett Billow she recommended that we ask Dr. Mingo Amber to write a letter on patients behalf to present to Dr. Estanislado Pandy to see if we can get the patient seen there since they denied the referral. Left message for pt to call back to let her know that we requested her PCP to do this if he will. Also routing to Education officer, museum as an FYI if there may be anything that they can do to assist patient or any advice they may have.   Katharina Caper, April D, [place]

## 2018-04-12 NOTE — Telephone Encounter (Signed)
If [facility] physicians do not take the [employer], she will need to continue with [facility], where she has been seen before.  Please call and let her know.  Thanks!  JW

## 2018-04-20 ENCOUNTER — Telehealth: Payer: Self-pay

## 2018-04-20 NOTE — Telephone Encounter (Signed)
Patient left message that gout she was seen for last week has now moved from her right foot to left and hand swollen. Has finished all medication. Wants to know if she needs another Rx or what she should do.  Call back is [phone]  Danley Danker, RN [facility])

## 2018-04-21 NOTE — Telephone Encounter (Signed)
Called patient back. Pain in R foot is better but endorsing similar pain in hand and contralateral foot. Initial differential thought to be gout but would be unusual for gout to progress to other joints after steroid treatment. Also endorsing cough with blood. No fevers or difficulty breathing. Patient already made appointment for tomorrow morning. Encouraged to keep appointment to evaluate cough and need for imaging of other joints. Red flags and emergency precautions reviewed.  Rory Percy, DO PGY-2, [facility] 04/21/2018 10:46 AM

## 2018-04-22 ENCOUNTER — Ambulatory Visit
Admission: RE | Admit: 2018-04-22 | Discharge: 2018-04-22 | Disposition: A | Discharge status: Home / Self Care | Payer: No Typology Code available for payment source | Source: Ambulatory Visit | Attending: Family Medicine | Admitting: Family Medicine

## 2018-04-22 ENCOUNTER — Ambulatory Visit (INDEPENDENT_AMBULATORY_CARE_PROVIDER_SITE_OTHER): Payer: Medicaid Other | Admitting: Family Medicine

## 2018-04-22 ENCOUNTER — Encounter: Payer: Self-pay | Admitting: Family Medicine

## 2018-04-22 ENCOUNTER — Other Ambulatory Visit: Payer: Self-pay

## 2018-04-22 VITALS — BP 126/78 | HR 70 | Temp 97.9°F | Ht 65.0 in | Wt 384.0 lb

## 2018-04-22 DIAGNOSIS — M349 Systemic sclerosis, unspecified: Secondary | ICD-10-CM | POA: Diagnosis not present

## 2018-04-22 DIAGNOSIS — R042 Hemoptysis: Secondary | ICD-10-CM | POA: Insufficient documentation

## 2018-04-22 DIAGNOSIS — M25572 Pain in left ankle and joints of left foot: Secondary | ICD-10-CM | POA: Insufficient documentation

## 2018-04-22 HISTORY — DX: Hemoptysis: R04.2

## 2018-04-22 MED FILL — XARELTO 20 MG TABLET: 20 | 30 days supply | Qty: 30 | Fill #3

## 2018-04-22 NOTE — Patient Instructions (Signed)
I would like to get some blood tests and an x ray for your bloody cough.  If those do not reveal anything we may consider a CT scan later.  I will call you with the results when I get them.    I have put in a referral to [facility] rheumatologists.  When they call you, make sure you ask what their payment plan is.    Your foot pain was possibly a gout flare, but could be something else called morton's neuroma.  It appears to be getting better now so there is nothing we need to do for it at the moment.    Please come back in two weeks.

## 2018-04-22 NOTE — Progress Notes (Signed)
[facility] Phone: [phone]   cc: coughing up blood  Subjective:  Hemoptysis:  She had a bad 'cold' this summer and developed a cough that has been 'on and off' since the summer.  These past two weeks it has become worse and more consistent.  Had a productive cough of yellowish green sputum these past two weeks.  First time she noticed blood was 5 days ago after she had gone to bed.  Has bad coughing spells at night.  Patient remembers having bloody sputum on Monday night, Tuesday during the day, and Wednesday night.  No complaints of shortness of breath or dyspnea on exertion.  She has bad coughing spells at night.  No abdominal symptoms. Has had heartburn symptoms since she had chemotherapy.   Not currently doing chemo or radiation. Patient currently Going through menopause - has had hot flashes since chemo and gets night sweats almost nightly from hot flashes.  This has been for two years.   Scleroderma/joint pain: about a year ago she was told by [facility] rheumatology taht she had lupus, but she did not have insurance so she could not be seen by them. .  Then one year later went to [facility] who Michela Pitcher she had scleroderma.  Prescribed her a medication, hydroxychloroquine, and said don't take it until she finished getting the blood work. Patient was unable to get blood work done [employer].  Patient is not taking her hydroxychloroquine.  Patient is still looking for a rheumatologist who will accept her.     Gout: right foot initially.  Was put on prednisone and she finished her course. Started feeling better this past weekend, then this Monday she had pain in first through third digits on the left foot.  Described as stiff and sore.  Pain is better now but is still sore.  She couldn't walk on it earlier this week.  Took some tylenol and it helped a little.  She soaked it and put ice on it.  This helped with the pain somewhat.  Very mild pain now and  can move her toes.    ROS: See HPI for pertinent positives and negatives  Past Medical History  Family history reviewed for today's visit. No changes.   Objective: BP 126/78   Pulse 70   Temp 97.9 F (36.6 C) (Oral)   Ht 5\' 5"  (1.651 m)   Wt (!) 384 lb (174.2 kg)   LMP 10/26/2013   SpO2 98%   BMI 63.90 kg/m  Gen: NAD, alert and oriented, cooperative with exam CV: normal rate, regular rhythm. No murmurs, no rubs. 2+ radial pulses bilaterally Resp: LCTAB, no wheezes, crackles. normal work of breathing Msk: No edema, warm, normal tone, moves UE/LE spontaneously Extremities: no rashes or selling of her left toes.  Mildly tender to palpation on the 2nd and 3rd interphalangeal joints. Full range of motion.  Skin: No rashes, no lesions Psych: Appropriate behavior  Assessment/Plan: Hemoptysis Patient has had multiple episodes of hemoptysis (see media tab) in the past 5 days.  Chronic recurring cough for approximately six months.  No other signs or symptoms. Vital signs normal.  Patient has recent history of cancer as well as recent history of PE, currently on NOAC.  Patient does not feel this is like previous PE episodes. Will get cbc, coag studies, and CXR for further workup.  If necessary will get CTA chest. Uncertain of cause at present.  Likely differential includes pulmonary embolism, supratherapeutic anticoagulation,  or metastasis of cancer to lungs.    Scleroderma ([place]) Have had difficulty trying to find a rheumatologist to work up patient's chronic joint pain and other symptoms. Most recently, [facility] told patient they believed it was scleroderma, but did not finish work up and labs due to patient's inability to pay for it.  Patient is not currently on any medication for this. Put in referral to [facility] rheumatology to see if they can take her.   Toe joint pain, left - complaint of pain in left 1st-3rd metatarsophalangeal joints. Pain is currently improving. Mild TTP.  No  further workup at this time.  Possible she has gout or possible it could be morton neuroma.  Patient said she 'has gout' but no diagnosis of gout in her problem list  Clemetine Marker, MD PGY-1

## 2018-04-22 NOTE — Assessment & Plan Note (Signed)
Have had difficulty trying to find a rheumatologist to work up patient's chronic joint pain and other symptoms. Most recently, [facility] rhe[facility] told patient they believed it was scleroderma, but did not finish work up and labs due to patient's inability to pay for it.  Patient is not currently on any medication for this. Put in referral to [facility] rheumatology to see if they can take her.

## 2018-04-22 NOTE — Assessment & Plan Note (Signed)
-   complaint of pain in left 1st-3rd metatarsophalangeal joints. Pain is currently improving. Mild TTP.  No further workup at this time.  Possible she has gout or possible it could be morton neuroma.  Patient said she 'has gout' but no diagnosis of gout in her problem list

## 2018-04-22 NOTE — Assessment & Plan Note (Signed)
Patient has had multiple episodes of hemoptysis (see media tab) in the past 5 days.  Chronic recurring cough for approximately six months.  No other signs or symptoms. Vital signs normal.  Patient has recent history of cancer as well as recent history of PE, currently on NOAC.  Patient does not feel this is like previous PE episodes. Will get cbc, coag studies, and CXR for further workup.  If necessary will get CTA chest. Uncertain of cause at present.  Likely differential includes pulmonary embolism, supratherapeutic anticoagulation, or metastasis of cancer to lungs.

## 2018-04-23 LAB — CBC WITH DIFFERENTIAL/PLATELET
Basophils Absolute: 0 10*3/uL (ref 0.0–0.2)
Basos: 1 %
EOS (ABSOLUTE): 0.1 10*3/uL (ref 0.0–0.4)
Eos: 1 %
HEMATOCRIT: 39 % (ref 34.0–46.6)
Hemoglobin: 12.8 g/dL (ref 11.1–15.9)
Immature Grans (Abs): 0 10*3/uL (ref 0.0–0.1)
Immature Granulocytes: 0 %
Lymphocytes Absolute: 2 10*3/uL (ref 0.7–3.1)
Lymphs: 24 %
MCH: 29.8 pg (ref 26.6–33.0)
MCHC: 32.8 g/dL (ref 31.5–35.7)
MCV: 91 fL (ref 79–97)
Monocytes Absolute: 0.7 10*3/uL (ref 0.1–0.9)
Monocytes: 8 %
Neutrophils Absolute: 5.7 10*3/uL (ref 1.4–7.0)
Neutrophils: 66 %
Platelets: 209 10*3/uL (ref 150–450)
RBC: 4.29 x10E6/uL (ref 3.77–5.28)
RDW: 13.9 % (ref 11.7–15.4)
WBC: 8.6 10*3/uL (ref 3.4–10.8)

## 2018-04-23 LAB — APTT: aPTT: 28 s (ref 24–33)

## 2018-04-23 LAB — PROTIME-INR
INR: 1.1 (ref 0.8–1.2)
Prothrombin Time: 11.3 s (ref 9.1–12.0)

## 2018-04-27 ENCOUNTER — Telehealth: Payer: Self-pay

## 2018-04-27 NOTE — Telephone Encounter (Signed)
Patient calling for xray results from 04/22/18.  Call back is [phone].  Danley Danker, RN [facility] RN)

## 2018-04-27 NOTE — Telephone Encounter (Signed)
Spoke with patient about results.  She is no longer having hemoptysis.  Told her to keep appointment for the 28th.

## 2018-05-04 ENCOUNTER — Ambulatory Visit: Payer: Self-pay | Admitting: Family Medicine

## 2018-05-05 ENCOUNTER — Ambulatory Visit (INDEPENDENT_AMBULATORY_CARE_PROVIDER_SITE_OTHER): Payer: Medicaid Other | Admitting: Family Medicine

## 2018-05-05 ENCOUNTER — Other Ambulatory Visit: Payer: Self-pay

## 2018-05-05 ENCOUNTER — Telehealth: Payer: Self-pay | Admitting: *Deleted

## 2018-05-05 ENCOUNTER — Encounter: Payer: Self-pay | Admitting: Family Medicine

## 2018-05-05 VITALS — BP 124/82 | HR 81 | Temp 97.7°F | Ht 65.0 in | Wt 388.4 lb

## 2018-05-05 DIAGNOSIS — M349 Systemic sclerosis, unspecified: Secondary | ICD-10-CM

## 2018-05-05 DIAGNOSIS — N3000 Acute cystitis without hematuria: Secondary | ICD-10-CM

## 2018-05-05 DIAGNOSIS — R109 Unspecified abdominal pain: Secondary | ICD-10-CM

## 2018-05-05 DIAGNOSIS — K219 Gastro-esophageal reflux disease without esophagitis: Secondary | ICD-10-CM

## 2018-05-05 MED ORDER — CEPHALEXIN 500 MG PO CAPS: 500.0000 mg | ORAL_CAPSULE | Freq: Four times a day (QID) | ORAL | 0 refills | Status: AC

## 2018-05-05 MED ORDER — ESOMEPRAZOLE MAGNESIUM 40 MG PO CPDR: 40.0000 mg | DELAYED_RELEASE_CAPSULE | Freq: Every day | ORAL | 3 refills | Status: DC

## 2018-05-05 NOTE — Progress Notes (Signed)
Subjective:    [name] is a 53 y.o. female who presents to [facility] today for joint pains:  1.  Joint pains: Ongoing.  Pains mostly in her hands and fingers as well as her feet.  These occur almost every day.  She takes tramadol or Tylenol for relief.  Because she is on long-term anticoagulation she does not take an NSAID.  She was seen by [facility] rheumatology and diagnosed with what sounds to be scleroderma plus or minus lupus based on her report.  On review of records it seems to be more scleroderma.  Either way she continues to suffer effects of this.  Because of her insurance status she has been able to be seen here in [place] or [place] or back at [facility] without paying out-of-pocket for her symptoms.  For right now she states that she is fairly stable.  She is attempted to be seen by  2.  Concern for UTI: Also complaining of urinary frequency, urgency and dysuria x 4-5 days.  She denies any flank or back pain, fever, chills, vomiting, or abnormal vaginal discharge/itching/bleeding.  Does endorse some suprapubic pressure.  Feels like prior UTIs.    3.  GERD: Reflux that started recently worse at night.  Has previously been on Protonix but this isn't helping as much now.  Would like to try something different. She previously was on a keto diet which helped with some of her weight as well as her reflux symptoms.  She has not been doing that diet and since reintroducing high levels of carbs back in her diet she has had worsened GERD symptoms.  No weight loss.  No nausea.  No vomiting.   Prev health:  Currently overdue for colonoscopy for which she cannot pay.  ROS as above per HPI.  Pertinently, no chest pain, palpitations, SOB, Fever, Chills, Abd pain, N/V/D.   The following portions of the patient's history were reviewed and updated as appropriate: allergies, current medications, past medical history, family and social history, and problem list. Patient is a nonsmoker.    PMH  reviewed.  Past Medical History:  Diagnosis Date  . Anemia   . Cough   . Diverticulitis with perforation 2010  . DVT (deep vein thrombosis) in pregnancy 11/24/2016  . DVT, lower extremity ([place])   . Endometrial cancer ([place])   . Family history of adverse reaction to anesthesia    pts daughter had difficulty awakening following anesthesia   . GERD (gastroesophageal reflux disease)   . Headache   . History of bronchitis   . History of ear infections   . Hx of migraines   . Hypertension   . IBS (irritable bowel syndrome)   . Kidney infection   . Lupus (systemic lupus erythematosus) ([place]) 02/2017  . Morbid obesity ([place])   . Ovarian cancer ([place]) dx'd 01/2015  . Pyelonephritis    Past Surgical History:  Procedure Laterality Date  . ABDOMINAL HYSTERECTOMY N/A 03/13/2015   Procedure: TOTAL HYSTERECTOMY ABDOMINAL BILATERAL SALPINGO OOPHORECTOMY RADICAL TUMOR [place];  Surgeon: Everitt Amber, MD;  Location: [facility];  Service: Gynecology;  Laterality: N/A;  . BOWEL RESECTION  03/13/2015   Procedure: SMALL BOWEL RESECTION;  Surgeon: Everitt Amber, MD;  Location: [facility];  Service: Gynecology;;  . [place] and 1984  . COLOSTOMY  2008  . COLOSTOMY TAKEDOWN    . LAPAROTOMY N/A 03/13/2015   Procedure: EXPLORATORY LAPAROTOMY;  Surgeon: Everitt Amber, MD;  Location: [facility];  Service:  Gynecology;  Laterality: N/A;  . TONSILLECTOMY AND ADENOIDECTOMY  1997  . VENTRAL HERNIA REPAIR  03/13/2015   Procedure: HERNIA REPAIR VENTRAL ADULT;  Surgeon: Everitt Amber, MD;  Location: [facility];  Service: Gynecology;;    Medications reviewed. Current Outpatient Medications  Medication Sig Dispense Refill  . acetaminophen (TYLENOL) 500 MG tablet Take 1,000 mg by mouth every 6 (six) hours as needed for mild pain, moderate pain, fever or headache. Reported on 09/10/2015    . cetirizine (ZYRTEC) 10 MG tablet Take 1 tablet (10 mg total) by mouth daily as needed for allergies (itching). 30 tablet 0  . furosemide (LASIX)  20 MG tablet Take 1 tab by mouth daily x 1 week, then PRN daily after that for leg swelling 30 tablet 0  . gabapentin (NEURONTIN) 100 MG capsule Take 100 mg by mouth at bedtime as needed.     . Iron-Vitamins (GERITOL COMPLETE) TABS Take 1 tablet by mouth daily.    Marland Kitchen lidocaine-prilocaine (EMLA) cream Apply to PAC site 1-2 hours prior to access as needed. 30 g 2  . Oral Electrolytes ([employer] ELECTROLYTE SOLUTION PO) Take 20-40 drops by mouth daily. Place 20-40 drops in liquid daily    . oxybutynin (DITROPAN-XL) 5 MG 24 hr tablet Take by mouth.    . pantoprazole (PROTONIX) 40 MG tablet TAKE 1 TABLET BY MOUTH ONCE A DAY 30 tablet 0  . predniSONE (DELTASONE) 10 MG tablet Take 3 tablets (30 mg total) by mouth daily with breakfast. Until 2 days after symptoms resolve. 21 tablet 0  . rivaroxaban (XARELTO) 20 MG TABS tablet Take 1 tablet (20 mg total) by mouth daily with supper. 14 tablet 0  . TOVIAZ 4 MG TB24 tablet TAKE 2 TABLETS (8MG  TOTAL) BY MOUTH DAILY. 90 tablet 2  . triamcinolone cream (KENALOG) 0.1 % APPLY TO THE AFFECTED AREA(S) TWICE DAILY AS NEEDED 30 g 1   No current facility-administered medications for this visit.    Facility-Administered Medications Ordered in Other Visits  Medication Dose Route Frequency Provider Last Rate Last Dose  . sodium chloride flush (NS) 0.9 % injection 10 mL  10 mL Intravenous PRN Livesay, Lennis P, MD   10 mL at 05/31/15 0300  . sodium chloride flush (NS) 0.9 % injection 10 mL  10 mL Intravenous PRN Livesay, Lennis P, MD   10 mL at 08/02/15 1442     Objective:   Physical Exam BP 124/82   Pulse 81   Temp 97.7 F (36.5 C) (Oral)   Ht 5\' 5"  (1.651 m)   Wt (!) 388 lb 6.4 oz (176.2 kg)   LMP 10/26/2013   SpO2 97%   BMI 64.63 kg/m  Gen:  Alert, cooperative patient who appears stated age in no acute distress.  Vital signs reviewed. HEENT: EOMI,  MMM Cardiac:  Regular rate and rhythm without murmur auscultated.  Good S1/S2. Pulm:  Clear to auscultation  bilaterally with good air movement.  No wheezes or rales noted.   MSK: No noticeable swelling to her hands or fingers or feet today.  No redness or effusion.  No results found for this or any previous visit (from the past 72 hour(s)).

## 2018-05-05 NOTE — Telephone Encounter (Signed)
Received fax from Health department. [facility] can obtain dexilant for free (samples are also available).  Would you consider changing med to [place]?  Will forward to MD. Clay Menser, Salome Spotted, [place]

## 2018-05-05 NOTE — Patient Instructions (Signed)
For your reflux take the Nexium at nighttime.  I also think going back on your previous diet can help with this.  Take the Keflex 4 times a day for the next 7 days.  This is an antibiotic for UTI.  It was good to see you today as always.  Let me know if there is anything I can do as far as the disability paperwork.

## 2018-05-07 ENCOUNTER — Encounter: Payer: Self-pay | Admitting: Family Medicine

## 2018-05-07 DIAGNOSIS — N390 Urinary tract infection, site not specified: Secondary | ICD-10-CM | POA: Insufficient documentation

## 2018-05-07 HISTORY — DX: Urinary tract infection, site not specified: N39.0

## 2018-05-07 MED ORDER — DEXLANSOPRAZOLE 60 MG PO CPDR: 60.0000 mg | DELAYED_RELEASE_CAPSULE | Freq: Every day | ORAL | 3 refills | Status: DC

## 2018-05-07 NOTE — Assessment & Plan Note (Addendum)
With ongoing joint pains.  She is trying to obtain disability which would help her with insurance.  That point we can refer her hearing rates for her back to [facility] where she felt comfortable being treated.  [place] symptom management.  She was recommended to start hydroxychloroquine but is unable to pay for this or any ongoing lab tests

## 2018-05-07 NOTE — Assessment & Plan Note (Addendum)
Treating based on symptoms.  Keflex x7 days.  Not doing UA because of cost and because no need due to symptoms.

## 2018-05-07 NOTE — Assessment & Plan Note (Addendum)
Worsening to switch back to less healthy diet.  Recommended to cut back on her previous keto diet which was helping with her mood, fatigue, reflux symptoms.  Nexium to treat in the meantime.

## 2018-05-07 NOTE — Telephone Encounter (Signed)
Switched to [facility]

## 2018-05-24 MED FILL — XARELTO 20 MG TABLET: 20 | 30 days supply | Qty: 30 | Fill #4

## 2018-05-26 ENCOUNTER — Encounter: Payer: Self-pay | Admitting: Family Medicine

## 2018-05-26 ENCOUNTER — Other Ambulatory Visit: Payer: Self-pay

## 2018-05-26 ENCOUNTER — Ambulatory Visit (INDEPENDENT_AMBULATORY_CARE_PROVIDER_SITE_OTHER): Payer: Medicaid Other | Admitting: Family Medicine

## 2018-05-26 VITALS — BP 120/82 | HR 67 | Temp 98.0°F | Wt 396.0 lb

## 2018-05-26 DIAGNOSIS — R05 Cough: Secondary | ICD-10-CM

## 2018-05-26 DIAGNOSIS — R042 Hemoptysis: Secondary | ICD-10-CM | POA: Diagnosis not present

## 2018-05-26 DIAGNOSIS — I2699 Other pulmonary embolism without acute cor pulmonale: Secondary | ICD-10-CM | POA: Diagnosis not present

## 2018-05-26 DIAGNOSIS — K219 Gastro-esophageal reflux disease without esophagitis: Secondary | ICD-10-CM

## 2018-05-26 DIAGNOSIS — C561 Malignant neoplasm of right ovary: Secondary | ICD-10-CM

## 2018-05-26 DIAGNOSIS — R053 Chronic cough: Secondary | ICD-10-CM

## 2018-05-26 DIAGNOSIS — M349 Systemic sclerosis, unspecified: Secondary | ICD-10-CM

## 2018-05-26 MED ORDER — IPRATROPIUM BROMIDE 0.02 % IN SOLN
0.5000 mg | Freq: Once | RESPIRATORY_TRACT | Status: AC
  Administered 2018-05-26: 0.5 mg via RESPIRATORY_TRACT

## 2018-05-26 MED ORDER — PREDNISONE 50 MG PO TABS: ORAL_TABLET | ORAL | 0 refills | Status: DC

## 2018-05-26 MED ORDER — DEXLANSOPRAZOLE 60 MG PO CPDR: 60.0000 mg | DELAYED_RELEASE_CAPSULE | Freq: Two times a day (BID) | ORAL | 3 refills | Status: DC

## 2018-05-26 MED ORDER — ALBUTEROL SULFATE HFA 108 (90 BASE) MCG/ACT IN AERS: 2.0000 | INHALATION_SPRAY | Freq: Four times a day (QID) | RESPIRATORY_TRACT | 0 refills | Status: DC | PRN

## 2018-05-26 MED ORDER — ALBUTEROL SULFATE (2.5 MG/3ML) 0.083% IN NEBU
2.5000 mg | INHALATION_SOLUTION | Freq: Once | RESPIRATORY_TRACT | Status: AC
  Administered 2018-05-26: 2.5 mg via RESPIRATORY_TRACT

## 2018-05-26 NOTE — Patient Instructions (Signed)
We're going to call this bronchitis.    Take the prednisone 1 pill a day for 5 days.  Use the albuterol inhaler 2 puffs every 4 - 6 hours while taking the prednisone.  Take the Dexilant once in the AM and once in the PM for the next 2 weeks.    Let me know if the bleeding is still going on after the prednisone.

## 2018-05-26 NOTE — Progress Notes (Signed)
Subjective:    [name] is a 53 y.o. female who presents to [facility] today for cough and GERD symptoms:  1.  Cough: Patient still with persistent cough.  Over a month now.  Worse at night.  She reports difficulty sleeping at night due to the cough.  For the past several days pretty consistently she has been coughing up blood most of the mornings.  It is blood-tinged sputum.  She has had some chills but no fever.  No nausea vomiting.  No sick contacts.  No myalgias.  No other URI symptoms.  No sore throat.  Just the cough.  2.  GERD:  Ongoing issue since last visit.  This is also been going on for a couple weeks.  It is difficult for patient to recall whether this preceded cough or came afterwards.  Fairly consistent indigestion and bloating sensations in epigastrium after meals.  She has decreased what she is eating secondary to this.  Previously controlled with Protonix but her insurance situation changed and other PPIs did not help to the degree that Protonix did.  She is on Dexilant which helped a little bit but wears off.  Worse at night when she lays flat after meals.  No actual chest pain.  Describes as "burning"    ROS as above per HPI.     The following portions of the patient's history were reviewed and updated as appropriate: allergies, current medications, past medical history, family and social history, and problem list. Patient is a nonsmoker.    PMH reviewed.  Past Medical History:  Diagnosis Date  . Anemia   . Cough   . Diverticulitis with perforation 2010  . DVT (deep vein thrombosis) in pregnancy 11/24/2016  . DVT, lower extremity ([place])   . Endometrial cancer ([place])   . Family history of adverse reaction to anesthesia    pts daughter had difficulty awakening following anesthesia   . GERD (gastroesophageal reflux disease)   . Headache   . History of bronchitis   . History of ear infections   . Hx of migraines   . Hypertension   . IBS (irritable bowel syndrome)   .  Kidney infection   . Lupus (systemic lupus erythematosus) ([place]) 02/2017  . Morbid obesity ([place])   . Ovarian cancer ([place]) dx'd 01/2015  . Pyelonephritis    Past Surgical History:  Procedure Laterality Date  . ABDOMINAL HYSTERECTOMY N/A 03/13/2015   Procedure: TOTAL HYSTERECTOMY ABDOMINAL BILATERAL SALPINGO OOPHORECTOMY RADICAL TUMOR [place];  Surgeon: Everitt Amber, MD;  [facility];  Service: Gynecology;  Laterality: N/A;  . BOWEL RESECTION  03/13/2015   Procedure: SMALL BOWEL RESECTION;  Surgeon: Everitt Amber, MD;  Location: [facility];  Service: Gynecology;;  . [place] and 1984  . COLOSTOMY  2008  . COLOSTOMY TAKEDOWN    . LAPAROTOMY N/A 03/13/2015   Procedure: EXPLORATORY LAPAROTOMY;  Surgeon: Everitt Amber, MD;  [facility];  Service: Gynecology;  Laterality: N/A;  . TONSILLECTOMY AND ADENOIDECTOMY  1997  . VENTRAL HERNIA REPAIR  03/13/2015   Procedure: HERNIA REPAIR VENTRAL ADULT;  Surgeon: Everitt Amber, MD;  [facility]: [facility];  Service: Gynecology;;    Medications reviewed. Current Outpatient Medications  Medication Sig Dispense Refill  . acetaminophen (TYLENOL) 500 MG tablet Take 1,000 mg by mouth every 6 (six) hours as needed for mild pain, moderate pain, fever or headache. Reported on 09/10/2015    . cetirizine (ZYRTEC) 10 MG tablet Take 1 tablet (10 mg  total) by mouth daily as needed for allergies (itching). 30 tablet 0  . dexlansoprazole (DEXILANT) 60 MG capsule Take 1 capsule (60 mg total) by mouth daily. 30 capsule 3  . gabapentin (NEURONTIN) 100 MG capsule Take 100 mg by mouth at bedtime as needed.     . Iron-Vitamins (GERITOL COMPLETE) TABS Take 1 tablet by mouth daily.    . Oral Electrolytes ([employer]S ELECTROLYTE SOLUTION PO) Take 20-40 drops by mouth daily. Place 20-40 drops in liquid daily    . oxybutynin (DITROPAN-XL) 5 MG 24 hr tablet Take by mouth.    . pantoprazole (PROTONIX) 40 MG tablet TAKE 1 TABLET BY MOUTH ONCE A DAY 30 tablet 0  . rivaroxaban (XARELTO) 20  MG TABS tablet Take 1 tablet (20 mg total) by mouth daily with supper. 14 tablet 0  . triamcinolone cream (KENALOG) 0.1 % APPLY TO THE AFFECTED AREA(S) TWICE DAILY AS NEEDED 30 g 1   No current facility-administered medications for this visit.    Facility-Administered Medications Ordered in Other Visits  Medication Dose Route Frequency Provider Last Rate Last Dose  . sodium chloride flush (NS) 0.9 % injection 10 mL  10 mL Intravenous PRN Livesay, Lennis P, MD   10 mL at 05/31/15 0300  . sodium chloride flush (NS) 0.9 % injection 10 mL  10 mL Intravenous PRN Livesay, Lennis P, MD   10 mL at 08/02/15 1442     Objective:   Physical Exam BP 120/82   Pulse 67   Temp 98 F (36.7 C) (Oral)   Wt (!) 396 lb (179.6 kg)   LMP 10/26/2013   SpO2 99%   BMI 65.90 kg/m  Gen:  Alert, cooperative patient who appears stated age in no acute distress.  Vital signs reviewed. HEENT: EOMI,  MMM Cardiac:  Regular rate and rhythm without murmur auscultated.  Good S1/S2. Pulm:  Some wheezing and decreased BS in BL bases Abd:  Nontender currently.  Obese  Exam s/p breathing treatment -- much better improvement in air flow and decreased wheezing.  Pt also felt subjectively better

## 2018-05-28 ENCOUNTER — Encounter: Payer: Self-pay | Admitting: Family Medicine

## 2018-05-28 NOTE — Assessment & Plan Note (Signed)
Uncontrolled.  Possibly contributing to cough Short term course of BID Dexilant Overall she needs GI referral and EGD with new onset worsening GERD.   However she is currently without insurance and has been unable to keep appointments with prior specialist referrals.  Between GI and Pulm, favor pulm referral.  Finances limit options.

## 2018-05-28 NOTE — Assessment & Plan Note (Signed)
Diagnosed here in conjunction with [facility].  She has been unable to keep any FU appts or referral to [facility] due to insurance status. She has contacted [facility] for acceptance through charity care

## 2018-05-28 NOTE — Assessment & Plan Note (Signed)
No changes or missed xarelto doses States this does NOT feel like prior PE

## 2018-05-28 NOTE — Assessment & Plan Note (Signed)
Patient obviously concerned about chronic cough and new onset hemoptysis with history of CA

## 2018-05-28 NOTE — Assessment & Plan Note (Signed)
Intermittent and now recurring.  Prior CXR negative Recommended CT today  Again, finances limit options Short term plan is to increase PPI and treat for bronchitis.  Symptoms have been about a month, which would fit with bronchitis. Obviously my other concerns (connective tissue disorder, Wegener's with concurrent CKD, CA, PE) still stand After discussing with patient and ongoing concern for finances, decision will be to treat for bronchitis.  If no improvement or only minimal improving, will obtain further ANCA, etc labs plus send for chest CT.

## 2018-05-29 ENCOUNTER — Emergency Department (HOSPITAL_COMMUNITY): Payer: Medicaid Other

## 2018-05-29 ENCOUNTER — Other Ambulatory Visit: Payer: Self-pay

## 2018-05-29 ENCOUNTER — Inpatient Hospital Stay (HOSPITAL_COMMUNITY)
Admission: EM | Admit: 2018-05-29 | Discharge: 2018-05-31 | DRG: 194 | Disposition: A | Discharge status: Home / Self Care | Payer: Medicaid Other | Attending: Family Medicine | Admitting: Family Medicine

## 2018-05-29 ENCOUNTER — Encounter (HOSPITAL_COMMUNITY): Payer: Self-pay | Admitting: Emergency Medicine

## 2018-05-29 DIAGNOSIS — Z6841 Body Mass Index (BMI) 40.0 and over, adult: Secondary | ICD-10-CM

## 2018-05-29 DIAGNOSIS — T451X5S Adverse effect of antineoplastic and immunosuppressive drugs, sequela: Secondary | ICD-10-CM

## 2018-05-29 DIAGNOSIS — G620 Drug-induced polyneuropathy: Secondary | ICD-10-CM | POA: Diagnosis present

## 2018-05-29 DIAGNOSIS — J189 Pneumonia, unspecified organism: Secondary | ICD-10-CM | POA: Diagnosis present

## 2018-05-29 DIAGNOSIS — D649 Anemia, unspecified: Secondary | ICD-10-CM | POA: Diagnosis present

## 2018-05-29 DIAGNOSIS — Z87440 Personal history of urinary (tract) infections: Secondary | ICD-10-CM

## 2018-05-29 DIAGNOSIS — Z880 Allergy status to penicillin: Secondary | ICD-10-CM | POA: Diagnosis not present

## 2018-05-29 DIAGNOSIS — Z9221 Personal history of antineoplastic chemotherapy: Secondary | ICD-10-CM

## 2018-05-29 DIAGNOSIS — Z8542 Personal history of malignant neoplasm of other parts of uterus: Secondary | ICD-10-CM | POA: Diagnosis not present

## 2018-05-29 DIAGNOSIS — Z7901 Long term (current) use of anticoagulants: Secondary | ICD-10-CM

## 2018-05-29 DIAGNOSIS — J111 Influenza due to unidentified influenza virus with other respiratory manifestations: Secondary | ICD-10-CM | POA: Diagnosis not present

## 2018-05-29 DIAGNOSIS — Z86718 Personal history of other venous thrombosis and embolism: Secondary | ICD-10-CM

## 2018-05-29 DIAGNOSIS — M329 Systemic lupus erythematosus, unspecified: Secondary | ICD-10-CM | POA: Diagnosis present

## 2018-05-29 DIAGNOSIS — Z800 Family history of malignant neoplasm of digestive organs: Secondary | ICD-10-CM | POA: Diagnosis not present

## 2018-05-29 DIAGNOSIS — R042 Hemoptysis: Secondary | ICD-10-CM | POA: Diagnosis present

## 2018-05-29 DIAGNOSIS — M109 Gout, unspecified: Secondary | ICD-10-CM | POA: Diagnosis present

## 2018-05-29 DIAGNOSIS — Z86711 Personal history of pulmonary embolism: Secondary | ICD-10-CM

## 2018-05-29 DIAGNOSIS — M349 Systemic sclerosis, unspecified: Secondary | ICD-10-CM | POA: Diagnosis not present

## 2018-05-29 DIAGNOSIS — D62 Acute posthemorrhagic anemia: Secondary | ICD-10-CM | POA: Diagnosis present

## 2018-05-29 DIAGNOSIS — Z8543 Personal history of malignant neoplasm of ovary: Secondary | ICD-10-CM

## 2018-05-29 DIAGNOSIS — Z807 Family history of other malignant neoplasms of lymphoid, hematopoietic and related tissues: Secondary | ICD-10-CM | POA: Diagnosis not present

## 2018-05-29 DIAGNOSIS — K449 Diaphragmatic hernia without obstruction or gangrene: Secondary | ICD-10-CM | POA: Diagnosis present

## 2018-05-29 DIAGNOSIS — I10 Essential (primary) hypertension: Secondary | ICD-10-CM | POA: Diagnosis present

## 2018-05-29 DIAGNOSIS — K219 Gastro-esophageal reflux disease without esophagitis: Secondary | ICD-10-CM | POA: Diagnosis present

## 2018-05-29 DIAGNOSIS — J101 Influenza due to other identified influenza virus with other respiratory manifestations: Principal | ICD-10-CM | POA: Diagnosis present

## 2018-05-29 DIAGNOSIS — R062 Wheezing: Secondary | ICD-10-CM

## 2018-05-29 DIAGNOSIS — J208 Acute bronchitis due to other specified organisms: Secondary | ICD-10-CM | POA: Diagnosis not present

## 2018-05-29 LAB — CBC WITH DIFFERENTIAL/PLATELET
Abs Immature Granulocytes: 0.06 10*3/uL (ref 0.00–0.07)
BASOS PCT: 1 %
Basophils Absolute: 0 10*3/uL (ref 0.0–0.1)
Eosinophils Absolute: 0 10*3/uL (ref 0.0–0.5)
Eosinophils Relative: 1 %
HCT: 34.7 % — ABNORMAL LOW (ref 36.0–46.0)
Hemoglobin: 10.7 g/dL — ABNORMAL LOW (ref 12.0–15.0)
Immature Granulocytes: 1 %
Lymphocytes Relative: 9 %
Lymphs Abs: 0.8 10*3/uL (ref 0.7–4.0)
MCH: 29.4 pg (ref 26.0–34.0)
MCHC: 30.8 g/dL (ref 30.0–36.0)
MCV: 95.3 fL (ref 80.0–100.0)
Monocytes Absolute: 0.7 10*3/uL (ref 0.1–1.0)
Monocytes Relative: 9 %
NRBC: 0 % (ref 0.0–0.2)
Neutro Abs: 6.8 10*3/uL (ref 1.7–7.7)
Neutrophils Relative %: 79 %
PLATELETS: 170 10*3/uL (ref 150–400)
RBC: 3.64 MIL/uL — ABNORMAL LOW (ref 3.87–5.11)
RDW: 13.7 % (ref 11.5–15.5)
WBC: 8.4 10*3/uL (ref 4.0–10.5)

## 2018-05-29 LAB — COMPREHENSIVE METABOLIC PANEL
ALT: 12 U/L (ref 0–44)
AST: 15 U/L (ref 15–41)
Albumin: 3.5 g/dL (ref 3.5–5.0)
Alkaline Phosphatase: 83 U/L (ref 38–126)
Anion gap: 8 (ref 5–15)
BUN: 21 mg/dL — ABNORMAL HIGH (ref 6–20)
CO2: 24 mmol/L (ref 22–32)
Calcium: 8.5 mg/dL — ABNORMAL LOW (ref 8.9–10.3)
Chloride: 108 mmol/L (ref 98–111)
Creatinine, Ser: 1.02 mg/dL — ABNORMAL HIGH (ref 0.44–1.00)
GFR calc Af Amer: >60 mL/min (ref >60)
GFR calc non Af Amer: >60 mL/min (ref >60)
Glucose, Bld: 103 mg/dL — ABNORMAL HIGH (ref 70–99)
Potassium: 3.7 mmol/L (ref 3.5–5.1)
Sodium: 140 mmol/L (ref 135–145)
Total Bilirubin: 0.3 mg/dL (ref 0.3–1.2)
Total Protein: 6.3 g/dL — ABNORMAL LOW (ref 6.5–8.1)

## 2018-05-29 LAB — INFLUENZA PANEL BY PCR (TYPE A & B)
INFLAPCR: POSITIVE — AB
Influenza B By PCR: NEGATIVE

## 2018-05-29 LAB — BRAIN NATRIURETIC PEPTIDE: B NATRIURETIC PEPTIDE 5: 235 pg/mL — AB (ref 0.0–100.0)

## 2018-05-29 MED ORDER — SODIUM CHLORIDE 0.9 % IV BOLUS
500.0000 mL | Freq: Once | INTRAVENOUS | Status: AC
  Administered 2018-05-29: 500 mL via INTRAVENOUS

## 2018-05-29 MED ORDER — ALBUTEROL SULFATE (2.5 MG/3ML) 0.083% IN NEBU: 2.5000 mg | INHALATION_SOLUTION | RESPIRATORY_TRACT | Status: DC | PRN

## 2018-05-29 MED ORDER — ONDANSETRON HCL 4 MG PO TABS: 4.0000 mg | ORAL_TABLET | Freq: Four times a day (QID) | ORAL | Status: DC | PRN

## 2018-05-29 MED ORDER — OXYBUTYNIN CHLORIDE ER 5 MG PO TB24
5.0000 mg | ORAL_TABLET | Freq: Every day | ORAL | Status: DC
  Administered 2018-05-29 – 2018-05-30 (×2): 5 mg via ORAL
  Filled 2018-05-29 (×2): qty 1

## 2018-05-29 MED ORDER — ACETAMINOPHEN 325 MG PO TABS
650.0000 mg | ORAL_TABLET | Freq: Four times a day (QID) | ORAL | Status: DC | PRN
  Administered 2018-05-29: 650 mg via ORAL
  Filled 2018-05-29: qty 2

## 2018-05-29 MED ORDER — LEVOFLOXACIN IN D5W 750 MG/150ML IV SOLN
750.0000 mg | Freq: Once | INTRAVENOUS | Status: AC
  Administered 2018-05-29: 750 mg via INTRAVENOUS
  Filled 2018-05-29: qty 150

## 2018-05-29 MED ORDER — RIVAROXABAN 20 MG PO TABS
20.0000 mg | ORAL_TABLET | Freq: Every day | ORAL | Status: DC
  Administered 2018-05-29 – 2018-05-30 (×2): 20 mg via ORAL
  Filled 2018-05-29 (×2): qty 1

## 2018-05-29 MED ORDER — METHYLPREDNISOLONE SODIUM SUCC 125 MG IJ SOLR
125.0000 mg | Freq: Once | INTRAMUSCULAR | Status: AC
  Administered 2018-05-29: 125 mg via INTRAVENOUS
  Filled 2018-05-29: qty 2

## 2018-05-29 MED ORDER — IOPAMIDOL (ISOVUE-370) INJECTION 76%
INTRAVENOUS | Status: AC
  Administered 2018-05-29: 100 mL
  Filled 2018-05-29: qty 100

## 2018-05-29 MED ORDER — IOPAMIDOL (ISOVUE-370) INJECTION 76%
100.0000 mL | Freq: Once | INTRAVENOUS | Status: AC | PRN
  Administered 2018-05-29: 100 mL via INTRAVENOUS

## 2018-05-29 MED ORDER — VANCOMYCIN HCL 10 G IV SOLR
2000.0000 mg | INTRAVENOUS | Status: AC
  Administered 2018-05-29: 2000 mg via INTRAVENOUS
  Filled 2018-05-29: qty 2000

## 2018-05-29 MED ORDER — ALBUTEROL SULFATE (2.5 MG/3ML) 0.083% IN NEBU
2.5000 mg | INHALATION_SOLUTION | Freq: Four times a day (QID) | RESPIRATORY_TRACT | Status: DC
  Administered 2018-05-29: 2.5 mg via RESPIRATORY_TRACT
  Filled 2018-05-29: qty 3

## 2018-05-29 MED ORDER — SODIUM CHLORIDE (PF) 0.9 % IJ SOLN
INTRAMUSCULAR | Status: AC
  Filled 2018-05-29: qty 50

## 2018-05-29 MED ORDER — ONDANSETRON HCL 4 MG/2ML IJ SOLN: 4.0000 mg | Freq: Four times a day (QID) | INTRAMUSCULAR | Status: DC | PRN

## 2018-05-29 MED ORDER — ALBUTEROL SULFATE (2.5 MG/3ML) 0.083% IN NEBU
2.5000 mg | INHALATION_SOLUTION | Freq: Three times a day (TID) | RESPIRATORY_TRACT | Status: DC
  Administered 2018-05-30 – 2018-05-31 (×4): 2.5 mg via RESPIRATORY_TRACT
  Filled 2018-05-29 (×4): qty 3

## 2018-05-29 MED ORDER — ALBUTEROL SULFATE (2.5 MG/3ML) 0.083% IN NEBU
5.0000 mg | INHALATION_SOLUTION | Freq: Once | RESPIRATORY_TRACT | Status: AC
  Administered 2018-05-29: 5 mg via RESPIRATORY_TRACT
  Filled 2018-05-29: qty 6

## 2018-05-29 MED ORDER — LEVOFLOXACIN IN D5W 750 MG/150ML IV SOLN
750.0000 mg | INTRAVENOUS | Status: DC
  Administered 2018-05-30: 750 mg via INTRAVENOUS
  Filled 2018-05-29 (×2): qty 150

## 2018-05-29 MED ORDER — ZOLPIDEM TARTRATE 5 MG PO TABS
5.0000 mg | ORAL_TABLET | Freq: Once | ORAL | Status: AC
  Administered 2018-05-29: 5 mg via ORAL
  Filled 2018-05-29: qty 1

## 2018-05-29 MED ORDER — PANTOPRAZOLE SODIUM 40 MG PO TBEC
40.0000 mg | DELAYED_RELEASE_TABLET | Freq: Two times a day (BID) | ORAL | Status: DC
  Administered 2018-05-29 – 2018-05-31 (×4): 40 mg via ORAL
  Filled 2018-05-29 (×4): qty 1

## 2018-05-29 MED ORDER — SODIUM CHLORIDE 0.45 % IV SOLN
INTRAVENOUS | Status: DC
  Administered 2018-05-29 – 2018-05-30 (×2): via INTRAVENOUS

## 2018-05-29 MED ORDER — OSELTAMIVIR PHOSPHATE 75 MG PO CAPS
75.0000 mg | ORAL_CAPSULE | Freq: Two times a day (BID) | ORAL | Status: DC
  Administered 2018-05-29 – 2018-05-31 (×4): 75 mg via ORAL
  Filled 2018-05-29 (×4): qty 1

## 2018-05-29 MED ORDER — METHYLPREDNISOLONE SODIUM SUCC 125 MG IJ SOLR
60.0000 mg | Freq: Three times a day (TID) | INTRAMUSCULAR | Status: DC
  Administered 2018-05-29 – 2018-05-30 (×2): 60 mg via INTRAVENOUS
  Filled 2018-05-29 (×2): qty 2

## 2018-05-29 MED ORDER — ACETAMINOPHEN 650 MG RE SUPP: 650.0000 mg | Freq: Four times a day (QID) | RECTAL | Status: DC | PRN

## 2018-05-29 NOTE — H&P (Signed)
[facility] History and Physical  TALESHA ELLITHORPE DEY:[id number] DOB: [birth year] DOA: 05/29/2018   PCP: Alveda Reasons, MD  Specialists: None  Chief Complaint: Shortness of breath worse over the last few days.  Blood in the sputum for a month.  HPI: [name] is a 53 y.o. female with a past medical history of pulmonary embolism on lifelong anticoagulation with rivaroxaban, history of scleroderma, history of SLE, unspecified anemia, history of ovarian cancer who has been noticing blood in her sputum for about a month now.  Patient is anticoagulated with rivaroxaban for her history of DVT and PE.  She is thought Alison require lifelong anticoagulation.  Patient went Alison her primary care providers about 3 weeks ago for evaluation of hemoptysis.  She had a chest x-ray done which did not reveal any acute findings.  According Alison the PCP notes her symptoms did abate for a brief while.  However it looks like she again started noticing blood in the sputum last week.  The sputum has changed color into a yellowish expectoration.  She went Alison see her primary care provider a few days ago.  She was apparently wheezing.  She was given a nebulizer treatment and was sent home with a prescription for steroids and albuterol inhaler.  She was not prescribed antibiotics.  Despite this treatment patient did not feel better so she decided Alison come into the emergency department.  She denies any chest pain per se.  She has been wheezing.  She feels short of breath even with minimal exertion.  Has had some leg swelling but none currently.  She also had a bout of what appears Alison be acute gout involving her right foot recently which has also resolved.  She denies any diarrhea.  Has had a few episodes of nausea and vomiting.  Denies any blood in the emesis.  No dizziness or lightheadedness.  She does feel fatigued.  In the emergency department patient underwent CT angiogram of the chest which raised concern for  multifocal pneumonia.  Patient mentions that she is up-Alison-date on her influenza vaccination.  She did have a fever of 102 F in the last 24 hours.  Home Medications: Prior Alison Admission medications   Medication Sig Start Date End Date Taking? Authorizing Provider  acetaminophen (TYLENOL) 500 MG tablet Take 1,000 mg by mouth every 6 (six) hours as needed for mild pain, moderate pain, fever or headache. Reported on 09/10/2015    [provider]  albuterol (PROVENTIL HFA;VENTOLIN HFA) 108 (90 Base) MCG/ACT inhaler Inhale 2 puffs into the lungs every 6 (six) hours as needed for wheezing or shortness of breath. Please instruct patient in usage. 05/26/18   Alveda Reasons, MD  cetirizine (ZYRTEC) 10 MG tablet Take 1 tablet (10 mg total) by mouth daily as needed for allergies (itching). 01/22/16   Leeanne Rio, MD  dexlansoprazole (DEXILANT) 60 MG capsule Take 1 capsule (60 mg total) by mouth 2 (two) times daily. 05/26/18   Alveda Reasons, MD  gabapentin (NEURONTIN) 100 MG capsule Take 100 mg by mouth at bedtime as needed.     [provider]  Iron-Vitamins (GERITOL COMPLETE) TABS Take 1 tablet by mouth daily.    [provider]  Oral Electrolytes ([employer] ELECTROLYTE SOLUTION PO) Take 20-40 drops by mouth daily. Place 20-40 drops in liquid daily    [provider]  oxybutynin (DITROPAN-XL) 5 MG 24 hr tablet Take by mouth.    [provider]  pantoprazole (  PROTONIX) 40 MG tablet TAKE 1 TABLET BY MOUTH ONCE A DAY 03/14/18   Leeanne Rio, MD  predniSONE (DELTASONE) 50 MG tablet Take 1 tab daily x 5 days 05/26/18   Alveda Reasons, MD  rivaroxaban (XARELTO) 20 MG TABS tablet Take 1 tablet (20 mg total) by mouth daily with supper. 10/19/17   Alveda Reasons, MD  triamcinolone cream (KENALOG) 0.1 % APPLY Alison THE AFFECTED AREA(S) TWICE DAILY AS NEEDED 10/09/17   Caroline More, DO    Allergies:  Allergies  Allergen Reactions  . Penicillins Rash    Has  patient had a PCN reaction causing immediate rash, facial/tongue/throat swelling, SOB or lightheadedness with hypotension: no Has patient had a PCN reaction causing severe rash involving mucus membranes or skin necrosis: no Has patient had a PCN reaction that required hospitalization in the hospital at the time Has patient had a PCN reaction occurring within the last 10 years: no If all of the above answers are "NO", then may proceed with Cephalosporin use.     Past Medical History: Past Medical History:  Diagnosis Date  . Anemia   . Cough   . Diverticulitis with perforation 2010  . DVT (deep vein thrombosis) in pregnancy 11/24/2016  . DVT, lower extremity ([place])   . Endometrial cancer ([place])   . Family history of adverse reaction Alison anesthesia    pts daughter had difficulty awakening following anesthesia   . GERD (gastroesophageal reflux disease)   . Headache   . History of bronchitis   . History of ear infections   . Hx of migraines   . Hypertension   . IBS (irritable bowel syndrome)   . Kidney infection   . Lupus (systemic lupus erythematosus) ([place]) 02/2017  . Morbid obesity ([place])   . Ovarian cancer ([place]) dx'd 01/2015  . Pyelonephritis     Past Surgical History:  Procedure Laterality Date  . ABDOMINAL HYSTERECTOMY N/A 03/13/2015   Procedure: TOTAL HYSTERECTOMY ABDOMINAL BILATERAL SALPINGO OOPHORECTOMY RADICAL TUMOR [place];  Surgeon: Everitt Amber, MD;  Location: [facility];  Service: Gynecology;  Laterality: N/A;  . BOWEL RESECTION  03/13/2015   Procedure: SMALL BOWEL RESECTION;  Surgeon: Everitt Amber, MD;  Location: [facility];  Service: Gynecology;;  . [place] and 1984  . COLOSTOMY  2008  . COLOSTOMY TAKEDOWN    . LAPAROTOMY N/A 03/13/2015   Procedure: EXPLORATORY LAPAROTOMY;  Surgeon: Everitt Amber, MD;  Location: [facility];  Service: Gynecology;  Laterality: N/A;  . TONSILLECTOMY AND ADENOIDECTOMY  1997  . VENTRAL HERNIA REPAIR  03/13/2015   Procedure: HERNIA REPAIR  VENTRAL ADULT;  Surgeon: Everitt Amber, MD;  Location: [facility];  Service: Gynecology;;    Social History: She lives in [place].  No history of smoking or alcohol use.  No illicit drug use.    Family History:  Family History  Problem Relation Age of Onset  . Diabetes Mother   . Hypertension Mother   . Hyperlipidemia Mother   . Colon polyps Mother        4 total  . Fibroids Mother        s/p hysterectomy  . Hyperlipidemia Father   . Diabetes Daughter   . Hodgkin's lymphoma Daughter 35  . Asthma Maternal Aunt        severe - d. 67  . Prostate cancer Maternal Uncle 52  . Diabetes Paternal Aunt   . Diabetes Paternal Uncle   . Kidney failure Maternal Grandmother 84  .  Pancreatic cancer Paternal Grandmother        dx. >50  . Diabetes Paternal Grandfather   . Diabetes Paternal Aunt   . Pancreatic cancer Cousin 58       paternal 1st cousin  . Heart disease Neg Hx   . Stroke Neg Hx      Review of Systems - History obtained from the patient General ROS: positive for  - chills, fatigue and fever Psychological ROS: negative Ophthalmic ROS: negative ENT ROS: negative Allergy and Immunology ROS: negative Hematological and Lymphatic ROS: negative Endocrine ROS: negative Respiratory ROS: as in hpi Cardiovascular ROS: as in hpi Gastrointestinal ROS: as in hpi. No abdominal pain Genito-Urinary ROS: no dysuria, trouble voiding, or hematuria Musculoskeletal ROS: negative Neurological ROS: no TIA or stroke symptoms Dermatological ROS: negative  Physical Examination  Vitals:   05/29/18 0907 05/29/18 1415  BP: (!) 185/80 (!) 157/81  Pulse: (!) 111 94  Resp: (!) 25 20  Temp: 98.5 F (36.9 C) 98.5 F (36.9 C)  TempSrc: Oral Oral  SpO2: 96% 95%    BP (!) 157/81 (BP Location: Right Arm)   Pulse 94   Temp 98.5 F (36.9 C) (Oral)   Resp 20   LMP 10/26/2013   SpO2 95%   General appearance: alert, cooperative, appears stated age, no distress and morbidly obese Head:  Normocephalic, without obvious abnormality, atraumatic Eyes: conjunctivae/corneas clear. PERRL, EOM's intact.  Throat: Dry mucous membranes Neck: no adenopathy, no carotid bruit, no JVD, supple, symmetrical, trachea midline and thyroid not enlarged, symmetric, no tenderness/mass/nodules Resp: Tachypneic at rest.  No use of accessory muscles.  Coarse breath sounds.  Wheezing heard bilaterally.  Crackles at the bases. Cardio: regular rate and rhythm, S1, S2 normal, no murmur, click, rub or gallop GI: soft, non-tender; bowel sounds normal; no masses,  no organomegaly Extremities: extremities normal, atraumatic, no cyanosis or edema Pulses: 2+ and symmetric Skin: Skin color, texture, turgor normal. No rashes or lesions Lymph nodes: Cervical, supraclavicular, and axillary nodes normal. Neurologic: Alert and oriented x3.  No focal neurological deficits.   Labs on Admission: I have personally reviewed following labs and imaging studies  CBC: Recent Labs  Lab 05/29/18 0958  WBC 8.4  NEUTROABS 6.8  HGB 10.7*  HCT 34.7*  MCV 95.3  PLT 324   Basic Metabolic Panel: Recent Labs  Lab 05/29/18 0958  NA 140  K 3.7  CL 108  CO2 24  GLUCOSE 103*  BUN 21*  CREATININE 1.02*  CALCIUM 8.5*   GFR: Estimated Creatinine Clearance: 106.7 mL/min (A) (by C-G formula based on SCr of 1.02 mg/dL (H)). Liver Function Tests: Recent Labs  Lab 05/29/18 0958  AST 15  ALT 12  ALKPHOS 83  BILITOT 0.3  PROT 6.3*  ALBUMIN 3.5    Radiological Exams on Admission: Dg Chest 2 View  Result Date: 05/29/2018 CLINICAL DATA:  Short of breath EXAM: CHEST - 2 VIEW COMPARISON:  04/22/2018 FINDINGS: Right jugular Port-A-Cath is stable with its tip at the cavoatrial junction. The heart is moderately enlarged. There is patchy airspace disease at the right lung base. Lungs are otherwise clear. No pneumothorax or pleural effusion. IMPRESSION: Right basilar airspace disease. Followup PA and lateral chest X-ray is  recommended in 3-4 weeks following trial of antibiotic therapy Alison ensure resolution and exclude underlying malignancy. Electronically Signed   By: Marybelle Killings M.D.   On: 05/29/2018 10:37   Ct Angio Chest Pe W/cm &/or Wo Cm  Result Date: 05/29/2018 CLINICAL  DATA:  53 year old with chest pain. Intermediate/high probability for acute coronary syndrome, pulmonary embolism or acute aortic syndrome. EXAM: CT ANGIOGRAPHY CHEST WITH CONTRAST TECHNIQUE: Multidetector CT imaging of the chest was performed using the standard protocol during bolus administration of intravenous contrast. Multiplanar CT image reconstructions and MIPs were obtained Alison evaluate the vascular anatomy. CONTRAST:  147mL ISOVUE-370 IOPAMIDOL (ISOVUE-370) INJECTION 76% COMPARISON:  Chest CT 11/23/2016 FINDINGS: Cardiovascular: Poor opacification of the pulmonary arteries and aorta. Pulmonary embolism cannot be evaluated on this examination. Limited evaluation for of the thoracic aorta but no evidence for aortic aneurysm. There is a Port-A-Cath with the tip near the superior cavoatrial junction. No significant pericardial fluid. Mediastinum/Nodes: Again noted is a small Alison moderate-sized hiatal hernia. No significant chest lymphadenopathy. No significant axillary lymph node enlargement. Lungs/Pleura: Trachea and mainstem bronchi are patent. Patchy airspace disease in both lower lobes, right side greater than left. Small amount of airspace disease near the right lung apex. No large pleural effusions. Patchy airspace disease in the left upper lobe. Upper Abdomen: No acute abnormality in the visualized upper abdominal structures. Musculoskeletal: No acute bone abnormality. Review of the MIP images confirms the above findings. IMPRESSION: 1. Bilateral airspace disease. Findings are most compatible with multifocal pneumonia. 2. Nondiagnostic study for pulmonary embolism. Pulmonary arteries are not adequately opacified on this examination. 3. Limited  evaluation of the thoracic aorta but no evidence for aortic aneurysm. 4. Small Alison moderate sized hiatal hernia. Electronically Signed   By: Markus Daft M.D.   On: 05/29/2018 12:44    My interpretation of Electrocardiogram: Sinus rhythm in the 90s.  Normal axis.  Intervals appear Alison be normal.  No concerning ST or T wave changes.   Problem List  Principal Problem:   Community acquired pneumonia Active Problems:   Long term current use of anticoagulant therapy   Scleroderma (HCC)   Hemoptysis   Normocytic anemia   CAP (community acquired pneumonia)   Assessment: This is a 53 year old African-American female with past medical history as stated earlier who comes in with a one-month history of on and off hemoptysis and few day history of worsening shortness of breath and wheezing.  She is found Alison have multifocal pneumonia.  There does not appear Alison be any recurrence of her pulmonary embolism.  Reason for the hemoptysis could be pneumonia and bronchitis.  She did show me pictures of her bloody sputum and the sputum was blood-tinged.  No frank hemoptysis has been present.  Plan:  1. Community-acquired pneumonia with a component of acute bronchitis: She will be admitted Alison the hospital as she has not improved with outpatient management.  She will be placed on Levaquin.  She will be given IV steroids.  Nebulizer treatments.  Check influenza PCR.  Droplet precautions for now.  She is not hypoxic currently.  No evidence of sepsis.  She was slightly tachycardic at presentation but heart rate has improved.  WBC is normal.  2.  Hemoptysis: This could be in the setting of pneumonia and bronchitis.  Symptoms have been ongoing for a month or so.  CT angiogram did not raise concern for any masses in the lungs.  If her symptoms do not improve with treatment as outlined above then pulmonology consult could be considered.  3.  Normocytic anemia: Slight drop in hemoglobin is noted compared Alison her baseline.   Could be due Alison the hemoptysis.  Will monitor closely.  4.  History of pulmonary embolism and DVT on long-term anticoagulation: Even  though she does have hemoptysis there is no indication Alison stop her anticoagulation for now.  We will continue with the rivaroxaban.  If this worsens or if she has frank hemoptysis in significant amounts then anticoagulation may need Alison be held.  5.  History of SLE and scleroderma: Outpatient management.  6.  Morbid obesity: BMI is 65.9.  DVT Prophylaxis: Fully anticoagulated with rivaroxaban Code Status: Full code Family Communication: Discussed with the patient and her daughter Disposition: Hopefully will be able Alison go back home once better Consults called: None Admission Status: Inpatient  Severity of Illness: The appropriate patient status for this patient is INPATIENT. Inpatient status is judged Alison be reasonable and necessary in order Alison provide the required intensity of service Alison ensure the patient's safety. The patient's presenting symptoms, physical exam findings, and initial radiographic and laboratory data in the context of their chronic comorbidities is felt Alison place them at high risk for further clinical deterioration. Furthermore, it is not anticipated that the patient will be medically stable for discharge from the hospital within 2 midnights of admission. The following factors support the patient status of inpatient.   " The patient's presenting symptoms include cough shortness of breath wheezing hemoptysis. " The worrisome physical exam findings include bilateral wheezing and crackles. " The initial radiographic and laboratory data are worrisome because of multifocal pneumonia. " The chronic co-morbidities include history of venous thromboembolism.   * I certify that at the point of admission it is my clinical judgment that the patient will require inpatient hospital care spanning beyond 2 midnights from the point of admission due Alison high  intensity of service, high risk for further deterioration and high frequency of surveillance required.*    Further management decisions will depend on results of further testing and patient's response Alison treatment.   Berlin Viereck [employer]  [facility] operational officer on [email or web address]  05/29/2018, 2:40 PM

## 2018-05-29 NOTE — ED Provider Notes (Signed)
[facility] DEPT Provider Note   CSN: [id number] Arrival date & time: 05/29/18  8099    History   Chief Complaint Chief Complaint  Patient presents with  . Shortness of Breath    HPI [name] is a 53 y.o. female.     HPI Patient with history of multiple medical issues including PE, lupus, scleroderma presents with worsening dyspnea, cough, fatigue. Patient notes that she takes her Xarelto as prescribed, but in addition to the aforementioned concerns has had hemoptysis over the past few days. She has been seen twice during this illness, once 3 weeks ago, and again 2 days ago, reportedly with generally unremarkable x-rays. She notes that she completed a course of steroids early in the illness, without change in her condition. She is currently using OTC meds without relief as well. Now, over the past day or so the patient has had worsening symptoms, fatigue, without obvious fever, but with chills. She is here with her mother who assists with the HPI.  Past Medical History:  Diagnosis Date  . Anemia   . Cough   . Diverticulitis with perforation 2010  . DVT (deep vein thrombosis) in pregnancy 11/24/2016  . DVT, lower extremity ([place])   . Endometrial cancer ([place])   . Family history of adverse reaction to anesthesia    pts daughter had difficulty awakening following anesthesia   . GERD (gastroesophageal reflux disease)   . Headache   . History of bronchitis   . History of ear infections   . Hx of migraines   . Hypertension   . IBS (irritable bowel syndrome)   . Kidney infection   . Lupus (systemic lupus erythematosus) ([place]) 02/2017  . Morbid obesity ([place])   . Ovarian cancer ([place]) dx'd 01/2015  . Pyelonephritis     Patient Active Problem List   Diagnosis Date Noted  . UTI (urinary tract infection) 05/07/2018  . Toe joint pain, left 04/22/2018  . Scleroderma ([place]) 04/22/2018  . Hemoptysis 04/22/2018  . Swelling of foot joint,  right 04/08/2018  . Left shoulder pain 11/11/2017  . Medication refill 10/09/2017  . Enlarged lymph nodes 08/13/2017  . Cramping of hands 12/31/2016  . Fatigue 11/13/2016  . Positive ANA (antinuclear antibody) 09/23/2016  . Eczema 01/27/2016  . Pulmonary embolism ([place]) 09/12/2015  . Portacath in place 09/12/2015  . Long term current use of anticoagulant therapy 09/06/2015  . Slow rate of speech 08/22/2015  . Chemotherapy-induced peripheral neuropathy ([place]) 06/13/2015  . CKD (chronic kidney disease) stage 3, GFR 30-59 ml/min (HCC) 05/12/2015  . GERD (gastroesophageal reflux disease) 05/12/2015  . 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 Endometrial ca ([place]) 03/29/2015  . Ventral hernia without obstruction or gangrene   . Morbid obesity ([place]) 02/19/2015  . Essential hypertension, benign   . Stress at home 12/14/2014  . Left knee pain 08/04/2013  . Seasonal allergies 07/29/2013  . Right knee pain 11/28/2011    Past Surgical History:  Procedure Laterality Date  . ABDOMINAL HYSTERECTOMY N/A 03/13/2015   Procedure: TOTAL HYSTERECTOMY ABDOMINAL BILATERAL SALPINGO OOPHORECTOMY RADICAL TUMOR [place];  Surgeon: Everitt Amber, MD;  Location: [facility];  Service: Gynecology;  Laterality: N/A;  . BOWEL RESECTION  03/13/2015   Procedure: SMALL BOWEL RESECTION;  Surgeon: Everitt Amber, MD;  Location: [facility];  Service: Gynecology;;  . [place] and 1984  . COLOSTOMY  2008  . COLOSTOMY TAKEDOWN    . LAPAROTOMY N/A 03/13/2015   Procedure: EXPLORATORY  LAPAROTOMY;  Surgeon: Everitt Amber, MD;  Location: [facility];  Service: Gynecology;  Laterality: N/A;  . TONSILLECTOMY AND ADENOIDECTOMY  1997  . VENTRAL HERNIA REPAIR  03/13/2015   Procedure: HERNIA REPAIR VENTRAL ADULT;  Surgeon: Everitt Amber, MD;  Location: [facility];  Service: Gynecology;;     OB History    Gravida  2   Para      Term      Preterm      AB      Living  2     SAB      TAB      Ectopic      Multiple       Live Births  2            Home Medications    Prior to Admission medications   Medication Sig Start Date End Date Taking? Authorizing Provider  acetaminophen (TYLENOL) 500 MG tablet Take 1,000 mg by mouth every 6 (six) hours as needed for mild pain, moderate pain, fever or headache. Reported on 09/10/2015    [provider]  albuterol (PROVENTIL HFA;VENTOLIN HFA) 108 (90 Base) MCG/ACT inhaler Inhale 2 puffs into the lungs every 6 (six) hours as needed for wheezing or shortness of breath. Please instruct patient in usage. 05/26/18   Alveda Reasons, MD  cetirizine (ZYRTEC) 10 MG tablet Take 1 tablet (10 mg total) by mouth daily as needed for allergies (itching). 01/22/16   Leeanne Rio, MD  dexlansoprazole (DEXILANT) 60 MG capsule Take 1 capsule (60 mg total) by mouth 2 (two) times daily. 05/26/18   Alveda Reasons, MD  gabapentin (NEURONTIN) 100 MG capsule Take 100 mg by mouth at bedtime as needed.     [provider]  Iron-Vitamins (GERITOL COMPLETE) TABS Take 1 tablet by mouth daily.    [provider]  Oral Electrolytes ([employer] ELECTROLYTE SOLUTION PO) Take 20-40 drops by mouth daily. Place 20-40 drops in liquid daily    [provider]  oxybutynin (DITROPAN-XL) 5 MG 24 hr tablet Take by mouth.    [provider]  pantoprazole (PROTONIX) 40 MG tablet TAKE 1 TABLET BY MOUTH ONCE A DAY 03/14/18   Leeanne Rio, MD  predniSONE (DELTASONE) 50 MG tablet Take 1 tab daily x 5 days 05/26/18   Alveda Reasons, MD  rivaroxaban (XARELTO) 20 MG TABS tablet Take 1 tablet (20 mg total) by mouth daily with supper. 10/19/17   Alveda Reasons, MD  triamcinolone cream (KENALOG) 0.1 % APPLY TO THE AFFECTED AREA(S) TWICE DAILY AS NEEDED 10/09/17   Caroline More, DO    Family History Family History  Problem Relation Age of Onset  . Diabetes Mother   . Hypertension Mother   . Hyperlipidemia Mother   . Colon polyps Mother        4 total  .  Fibroids Mother        s/p hysterectomy  . Hyperlipidemia Father   . Diabetes Daughter   . Hodgkin's lymphoma Daughter 41  . Asthma Maternal Aunt        severe - d. 65  . Prostate cancer Maternal Uncle 33  . Diabetes Paternal Aunt   . Diabetes Paternal Uncle   . Kidney failure Maternal Grandmother 84  . Pancreatic cancer Paternal Grandmother        dx. >50  . Diabetes Paternal Grandfather   . Diabetes Paternal Aunt   . Pancreatic cancer Cousin 31  paternal 1st cousin  . Heart disease Neg Hx   . Stroke Neg Hx     Social History Social History   Tobacco Use  . Smoking status: Never Smoker  . Smokeless tobacco: Never Used  Substance Use Topics  . Alcohol use: Yes    Comment: Maybe wine every 6 months  . Drug use: No     Allergies   Penicillins   Review of Systems Review of Systems  Constitutional:       Per HPI, otherwise negative  HENT:       Per HPI, otherwise negative  Respiratory:       Per HPI, otherwise negative  Cardiovascular:       Per HPI, otherwise negative  Gastrointestinal: Negative for vomiting.  Endocrine:       Negative aside from HPI  Genitourinary:       Neg aside from HPI   Musculoskeletal:       Per HPI, otherwise negative  Skin: Negative.   Neurological: Negative for syncope.     Physical Exam Updated Vital Signs BP (!) 185/80 (BP Location: Left Arm)   Pulse (!) 111   Temp 98.5 F (36.9 C) (Oral)   Resp (!) 25   LMP 10/26/2013   SpO2 96%   Physical Exam Vitals signs and nursing note reviewed.  Constitutional:      Appearance: She is well-developed. She is obese. She is ill-appearing and diaphoretic.     Comments: Obese adult female awake and alert, with obvious increased work of breathing  HENT:     Head: Normocephalic and atraumatic.  Eyes:     Conjunctiva/sclera: Conjunctivae normal.  Cardiovascular:     Rate and Rhythm: Regular rhythm. Tachycardia present.  Pulmonary:     Effort: Tachypnea and accessory muscle  usage present.     Breath sounds: Decreased breath sounds present.  Abdominal:     General: There is no distension.  Musculoskeletal:     Right lower leg: Edema present.  Skin:    General: Skin is warm.  Neurological:     Mental Status: She is alert and oriented to person, place, and time.     Cranial Nerves: No cranial nerve deficit.      ED Treatments / Results  Labs (all labs ordered are listed, but only abnormal results are displayed) Labs Reviewed  COMPREHENSIVE METABOLIC PANEL - Abnormal; Notable for the following components:      Result Value   Glucose, Bld 103 (*)    BUN 21 (*)    Creatinine, Ser 1.02 (*)    Calcium 8.5 (*)    Total Protein 6.3 (*)    All other components within normal limits  CBC WITH DIFFERENTIAL/PLATELET - Abnormal; Notable for the following components:   RBC 3.64 (*)    Hemoglobin 10.7 (*)    HCT 34.7 (*)    All other components within normal limits  BRAIN NATRIURETIC PEPTIDE - Abnormal; Notable for the following components:   B Natriuretic Peptide 235.0 (*)    All other components within normal limits   Radiology Dg Chest 2 View  Result Date: 05/29/2018 CLINICAL DATA:  Short of breath EXAM: CHEST - 2 VIEW COMPARISON:  04/22/2018 FINDINGS: Right jugular Port-A-Cath is stable with its tip at the cavoatrial junction. The heart is moderately enlarged. There is patchy airspace disease at the right lung base. Lungs are otherwise clear. No pneumothorax or pleural effusion. IMPRESSION: Right basilar airspace disease. Followup PA and lateral chest X-ray  is recommended in 3-4 weeks following trial of antibiotic therapy to ensure resolution and exclude underlying malignancy. Electronically Signed   By: Marybelle Killings M.D.   On: 05/29/2018 10:37   Ct Angio Chest Pe W/cm &/or Wo Cm  Result Date: 05/29/2018 CLINICAL DATA:  53 year old with chest pain. Intermediate/high probability for acute coronary syndrome, pulmonary embolism or acute aortic syndrome. EXAM:  CT ANGIOGRAPHY CHEST WITH CONTRAST TECHNIQUE: Multidetector CT imaging of the chest was performed using the standard protocol during bolus administration of intravenous contrast. Multiplanar CT image reconstructions and MIPs were obtained to evaluate the vascular anatomy. CONTRAST:  192mL ISOVUE-370 IOPAMIDOL (ISOVUE-370) INJECTION 76% COMPARISON:  Chest CT 11/23/2016 FINDINGS: Cardiovascular: Poor opacification of the pulmonary arteries and aorta. Pulmonary embolism cannot be evaluated on this examination. Limited evaluation for of the thoracic aorta but no evidence for aortic aneurysm. There is a Port-A-Cath with the tip near the superior cavoatrial junction. No significant pericardial fluid. Mediastinum/Nodes: Again noted is a small to moderate-sized hiatal hernia. No significant chest lymphadenopathy. No significant axillary lymph node enlargement. Lungs/Pleura: Trachea and mainstem bronchi are patent. Patchy airspace disease in both lower lobes, right side greater than left. Small amount of airspace disease near the right lung apex. No large pleural effusions. Patchy airspace disease in the left upper lobe. Upper Abdomen: No acute abnormality in the visualized upper abdominal structures. Musculoskeletal: No acute bone abnormality. Review of the MIP images confirms the above findings. IMPRESSION: 1. Bilateral airspace disease. Findings are most compatible with multifocal pneumonia. 2. Nondiagnostic study for pulmonary embolism. Pulmonary arteries are not adequately opacified on this examination. 3. Limited evaluation of the thoracic aorta but no evidence for aortic aneurysm. 4. Small to moderate sized hiatal hernia. Electronically Signed   By: Markus Daft M.D.   On: 05/29/2018 12:44    Procedures Procedures (including critical care time)  Medications Ordered in ED Medications  sodium chloride (PF) 0.9 % injection (has no administration in time range)  levofloxacin (LEVAQUIN) IVPB 750 mg (has no  administration in time range)  vancomycin (VANCOCIN) 2,000 mg in sodium chloride 0.9 % 500 mL IVPB (has no administration in time range)  albuterol (PROVENTIL) (2.5 MG/3ML) 0.083% nebulizer solution 5 mg (5 mg Nebulization Given 05/29/18 1007)  methylPREDNISolone sodium succinate (SOLU-MEDROL) 125 mg/2 mL injection 125 mg (125 mg Intravenous Given 05/29/18 1007)  sodium chloride 0.9 % bolus 500 mL (0 mLs Intravenous Stopped 05/29/18 1116)  iopamidol (ISOVUE-370) 76 % injection (100 mLs  Contrast Given 05/29/18 1145)  iopamidol (ISOVUE-370) 76 % injection 100 mL (100 mLs Intravenous Contrast Given 05/29/18 1221)     Initial Impression / Assessment and Plan / ED Course  I have reviewed the triage vital signs and the nursing notes.  Pertinent labs & imaging results that were available during my care of the patient were reviewed by me and considered in my medical decision making (see chart for details).      Exam the patient continues to have tachypnea, tachycardia, and a short ambulatory test resulted in substantial dyspnea. Patient's labs, CT, x-ray consistent with multifocal pneumonia, and given her increased work of breathing, dyspnea with minimal amounts of exertion, the patient will start antibiotics, be admitted for further evaluation, monitoring, management.   Final Clinical Impressions(s) / ED Diagnoses   Final diagnoses:  Pneumonia of both lungs due to infectious organism, unspecified part of lung      Carmin Muskrat, MD 05/29/18 1549

## 2018-05-29 NOTE — Progress Notes (Addendum)
Patient was transferred from ED to [facility] at 1608. RN got report at 1520. Alert and oriented x 4. No skin issue. Vital signs was taken and documented. Call light was in patient's reach. Room is set up.

## 2018-05-29 NOTE — ED Triage Notes (Addendum)
Patient here from home with complaints of increase SOB that started last night. Reports chronic PEs, currently on Xarelto. Also reports coughing up blood x1 month.

## 2018-05-29 NOTE — Progress Notes (Signed)
A consult was received from an ED physician for Vancomycin per pharmacy dosing.  The patient's profile has been reviewed for ht/wt/allergies/indication/available labs.   A one time order has been placed for 2g IV.  Further antibiotics/pharmacy consults should be ordered by admitting physician if indicated.                       Thank you, Romeo Rabon, PharmD. Mobile: [phone]. 05/29/2018,1:37 PM.

## 2018-05-29 NOTE — ED Notes (Signed)
ED TO INPATIENT HANDOFF REPORT  Name/Age/Gender [name] 53 y.o. female  Code Status Code Status History    Date Active Date Inactive Code Status Order ID Comments User Context   11/23/2016 1913 11/25/2016 1721 Full Code [id number]  Vianne Bulls, MD ED   08/23/2015 1508 08/24/2015 1902 Full Code [id number]  Domenic Polite, MD Inpatient   03/13/2015 1431 03/16/2015 1626 Full Code [id number]  Lahoma Crocker, MD Inpatient   02/19/2015 0212 02/21/2015 1811 Full Code [id number]  Mayo, Pete Pelt, MD Inpatient      Home/SNF/Other Home  Chief Complaint coughing up blood,breathing difficulty  Level of Care/Admitting Diagnosis ED Disposition    ED Disposition Condition [place]: [facility] [[id number]]  Level of Care: Med-Surg [16]  Diagnosis: CAP (community acquired pneumonia) [245809]  Admitting Physician: Bonnielee Haff [[id number]]  Attending Physician: Bonnielee Haff [[id number]]  Estimated length of stay: past midnight tomorrow  Certification:: I certify this patient will need inpatient services for at least 2 midnights  PT Class (Do Not Modify): Inpatient [101]  PT Acc Code (Do Not Modify): Private [1]       Medical History Past Medical History:  Diagnosis Date  . Anemia   . Cough   . Diverticulitis with perforation 2010  . DVT (deep vein thrombosis) in pregnancy 11/24/2016  . DVT, lower extremity ([place])   . Endometrial cancer ([place])   . Family history of adverse reaction to anesthesia    pts daughter had difficulty awakening following anesthesia   . GERD (gastroesophageal reflux disease)   . Headache   . History of bronchitis   . History of ear infections   . Hx of migraines   . Hypertension   . IBS (irritable bowel syndrome)   . Kidney infection   . Lupus (systemic lupus erythematosus) ([place]) 02/2017  . Morbid obesity ([place])   . Ovarian cancer ([place]) dx'd 01/2015  . Pyelonephritis     Allergies Allergies  Allergen  Reactions  . Penicillins Rash    Has patient had a PCN reaction causing immediate rash, facial/tongue/throat swelling, SOB or lightheadedness with hypotension: no Has patient had a PCN reaction causing severe rash involving mucus membranes or skin necrosis: no Has patient had a PCN reaction that required hospitalization in the hospital at the time Has patient had a PCN reaction occurring within the last 10 years: no If all of the above answers are "NO", then may proceed with Cephalosporin use.     IV Location/Drains/Wounds Patient Lines/Drains/Airways Status   Active Line/Drains/Airways    Name:   Placement date:   Placement time:   Site:   Days:   Implanted Port 04/11/15 Right Chest   04/11/15    1209    Chest   1144   Incision (Closed) 03/13/15 Abdomen Other (Comment)   03/13/15    1156     1173   Incision (Closed) 04/08/15 Abdomen Mid   04/08/15    2336     1147          Labs/Imaging Results for orders placed or performed during the hospital encounter of 05/29/18 (from the past 48 hour(s))  Comprehensive metabolic panel     Status: Abnormal   Collection Time: 05/29/18  9:58 AM  Result Value Ref Range   Sodium 140 135 - 145 mmol/L   Potassium 3.7 3.5 - 5.1 mmol/L   Chloride 108 98 - 111 mmol/L   CO2 24 22 - 32 mmol/L  Glucose, Bld 103 (H) 70 - 99 mg/dL   BUN 21 (H) 6 - 20 mg/dL   Creatinine, Ser 1.02 (H) 0.44 - 1.00 mg/dL   Calcium 8.5 (L) 8.9 - 10.3 mg/dL   Total Protein 6.3 (L) 6.5 - 8.1 g/dL   Albumin 3.5 3.5 - 5.0 g/dL   AST 15 15 - 41 U/L   ALT 12 0 - 44 U/L   Alkaline Phosphatase 83 38 - 126 U/L   Total Bilirubin 0.3 0.3 - 1.2 mg/dL   GFR calc non Af Amer >60 >60 mL/min   GFR calc Af Amer >60 >60 mL/min   Anion gap 8 5 - 15    Comment: Performed at [facility], [place] [address] [place], [place] [address]  CBC with Differential     Status: Abnormal   Collection Time: 05/29/18  9:58 AM  Result Value Ref Range   WBC 8.4 4.0 - 10.5 K/uL   RBC  3.64 (L) 3.87 - 5.11 MIL/uL   Hemoglobin 10.7 (L) 12.0 - 15.0 g/dL   HCT 34.7 (L) 36.0 - 46.0 %   MCV 95.3 80.0 - 100.0 fL   MCH 29.4 26.0 - 34.0 pg   MCHC 30.8 30.0 - 36.0 g/dL   RDW 13.7 11.5 - 15.5 %   Platelets 170 150 - 400 K/uL   nRBC 0.0 0.0 - 0.2 %   Neutrophils Relative % 79 %   Neutro Abs 6.8 1.7 - 7.7 K/uL   Lymphocytes Relative 9 %   Lymphs Abs 0.8 0.7 - 4.0 K/uL   Monocytes Relative 9 %   Monocytes Absolute 0.7 0.1 - 1.0 K/uL   Eosinophils Relative 1 %   Eosinophils Absolute 0.0 0.0 - 0.5 K/uL   Basophils Relative 1 %   Basophils Absolute 0.0 0.0 - 0.1 K/uL   Immature Granulocytes 1 %   Abs Immature Granulocytes 0.06 0.00 - 0.07 K/uL    Comment: Performed at [facility], [place] [address], [place], [place] [address]  Brain natriuretic peptide     Status: Abnormal   Collection Time: 05/29/18  9:58 AM  Result Value Ref Range   B Natriuretic Peptide 235.0 (H) 0.0 - 100.0 pg/mL    Comment: Performed at [facility], [place] [address], [place], [place] [address]   Dg Chest 2 View  Result Date: 05/29/2018 CLINICAL DATA:  Short of breath EXAM: CHEST - 2 VIEW COMPARISON:  04/22/2018 FINDINGS: Right jugular Port-A-Cath is stable with its tip at the cavoatrial junction. The heart is moderately enlarged. There is patchy airspace disease at the right lung base. Lungs are otherwise clear. No pneumothorax or pleural effusion. IMPRESSION: Right basilar airspace disease. Followup PA and lateral chest X-ray is recommended in 3-4 weeks following trial of antibiotic therapy to ensure resolution and exclude underlying malignancy. Electronically Signed   By: Marybelle Killings M.D.   On: 05/29/2018 10:37   Ct Angio Chest Pe W/cm &/or Wo Cm  Result Date: 05/29/2018 CLINICAL DATA:  53 year old with chest pain. Intermediate/high probability for acute coronary syndrome, pulmonary embolism or acute aortic syndrome. EXAM: CT ANGIOGRAPHY CHEST WITH CONTRAST TECHNIQUE:  Multidetector CT imaging of the chest was performed using the standard protocol during bolus administration of intravenous contrast. Multiplanar CT image reconstructions and MIPs were obtained to evaluate the vascular anatomy. CONTRAST:  182mL ISOVUE-370 IOPAMIDOL (ISOVUE-370) INJECTION 76% COMPARISON:  Chest CT 11/23/2016 FINDINGS: Cardiovascular: Poor opacification of the pulmonary arteries and aorta. Pulmonary embolism cannot  be evaluated on this examination. Limited evaluation for of the thoracic aorta but no evidence for aortic aneurysm. There is a Port-A-Cath with the tip near the superior cavoatrial junction. No significant pericardial fluid. Mediastinum/Nodes: Again noted is a small to moderate-sized hiatal hernia. No significant chest lymphadenopathy. No significant axillary lymph node enlargement. Lungs/Pleura: Trachea and mainstem bronchi are patent. Patchy airspace disease in both lower lobes, right side greater than left. Small amount of airspace disease near the right lung apex. No large pleural effusions. Patchy airspace disease in the left upper lobe. Upper Abdomen: No acute abnormality in the visualized upper abdominal structures. Musculoskeletal: No acute bone abnormality. Review of the MIP images confirms the above findings. IMPRESSION: 1. Bilateral airspace disease. Findings are most compatible with multifocal pneumonia. 2. Nondiagnostic study for pulmonary embolism. Pulmonary arteries are not adequately opacified on this examination. 3. Limited evaluation of the thoracic aorta but no evidence for aortic aneurysm. 4. Small to moderate sized hiatal hernia. Electronically Signed   By: Markus Daft M.D.   On: 05/29/2018 12:44    Pending Labs Unresulted Labs (From admission, onward)    Start     Ordered   05/29/18 1431  Influenza panel by PCR (type A & B)  Once,   R     05/29/18 1430   Signed and Held  HIV antibody (Routine Testing)  Tomorrow morning,   R     Signed and Held   Signed and Held   Culture, sputum-assessment  Once,   R     Signed and Held   Signed and Held  Gram stain  Once,   R     Signed and Held   Signed and Held  Strep pneumoniae urinary antigen  Once,   R     Signed and Held   Visual merchandiser and Occupational hygienist morning,   R     Signed and Held   Signed and Held  CBC  Tomorrow morning,   R     Signed and Held          Vitals/Pain Today's Vitals   05/29/18 0907 05/29/18 1415  BP: (!) 185/80 (!) 157/81  Pulse: (!) 111 94  Resp: (!) 25 20  Temp: 98.5 F (36.9 C) 98.5 F (36.9 C)  TempSrc: Oral Oral  SpO2: 96% 95%  PainSc: 0-No pain     Isolation Precautions Droplet precaution  Medications Medications  sodium chloride (PF) 0.9 % injection (has no administration in time range)  levofloxacin (LEVAQUIN) IVPB 750 mg (750 mg Intravenous New Bag/Given 05/29/18 1355)  vancomycin (VANCOCIN) 2,000 mg in sodium chloride 0.9 % 500 mL IVPB (2,000 mg Intravenous New Bag/Given 05/29/18 1421)  albuterol (PROVENTIL) (2.5 MG/3ML) 0.083% nebulizer solution 5 mg (5 mg Nebulization Given 05/29/18 1007)  methylPREDNISolone sodium succinate (SOLU-MEDROL) 125 mg/2 mL injection 125 mg (125 mg Intravenous Given 05/29/18 1007)  sodium chloride 0.9 % bolus 500 mL (0 mLs Intravenous Stopped 05/29/18 1116)  iopamidol (ISOVUE-370) 76 % injection (100 mLs  Contrast Given 05/29/18 1145)  iopamidol (ISOVUE-370) 76 % injection 100 mL (100 mLs Intravenous Contrast Given 05/29/18 1221)    Mobility walks with device

## 2018-05-30 DIAGNOSIS — M349 Systemic sclerosis, unspecified: Secondary | ICD-10-CM

## 2018-05-30 LAB — BASIC METABOLIC PANEL
Anion gap: 8 (ref 5–15)
BUN: 22 mg/dL — ABNORMAL HIGH (ref 6–20)
CO2: 24 mmol/L (ref 22–32)
Calcium: 8.5 mg/dL — ABNORMAL LOW (ref 8.9–10.3)
Chloride: 106 mmol/L (ref 98–111)
Creatinine, Ser: 1.05 mg/dL — ABNORMAL HIGH (ref 0.44–1.00)
GFR calc Af Amer: >60 mL/min (ref >60)
GFR calc non Af Amer: >60 mL/min (ref >60)
Glucose, Bld: 154 mg/dL — ABNORMAL HIGH (ref 70–99)
Potassium: 4.3 mmol/L (ref 3.5–5.1)
Sodium: 138 mmol/L (ref 135–145)

## 2018-05-30 LAB — CBC
HCT: 32.6 % — ABNORMAL LOW (ref 36.0–46.0)
Hemoglobin: 10.2 g/dL — ABNORMAL LOW (ref 12.0–15.0)
MCH: 29.7 pg (ref 26.0–34.0)
MCHC: 31.3 g/dL (ref 30.0–36.0)
MCV: 95 fL (ref 80.0–100.0)
Platelets: 173 10*3/uL (ref 150–400)
RBC: 3.43 MIL/uL — AB (ref 3.87–5.11)
RDW: 14 % (ref 11.5–15.5)
WBC: 6.8 10*3/uL (ref 4.0–10.5)
nRBC: 0 % (ref 0.0–0.2)

## 2018-05-30 LAB — HIV ANTIBODY (ROUTINE TESTING W REFLEX): HIV Screen 4th Generation wRfx: NONREACTIVE

## 2018-05-30 MED ORDER — METHYLPREDNISOLONE SODIUM SUCC 125 MG IJ SOLR
60.0000 mg | Freq: Two times a day (BID) | INTRAMUSCULAR | Status: DC
  Administered 2018-05-30 – 2018-05-31 (×2): 60 mg via INTRAVENOUS
  Filled 2018-05-30 (×2): qty 2

## 2018-05-30 MED ORDER — ZOLPIDEM TARTRATE 5 MG PO TABS
5.0000 mg | ORAL_TABLET | Freq: Every evening | ORAL | Status: DC | PRN
  Administered 2018-05-30: 5 mg via ORAL
  Filled 2018-05-30: qty 1

## 2018-05-30 MED ORDER — SODIUM CHLORIDE 0.9% FLUSH: 10.0000 mL | INTRAVENOUS | Status: DC | PRN

## 2018-05-30 NOTE — Evaluation (Addendum)
Physical Therapy Evaluation Patient Details Name: [name] MRN: [id number] DOB: 11-21-1965 Today's Date: 05/30/2018   History of Present Illness  53 y.o. female with a past medical history of pulmonary embolism on lifelong anticoagulation with rivaroxaban, history of scleroderma, history of SLE, unspecified anemia, history of ovarian cancer, OA B knees  who has been noticing blood in her sputum for about a month. Dx of CAP, flu.   Clinical Impression  Pt ambulated 38' with straight cane, no loss of balance, SaO2 96% on room air, 3/4 dyspnea. No physical assist needed for bed mobility, transfers or ambulation. Encouraged pt to ambulate several times/day in her room to minimize deconditioning. From PT standpoint, she is safe to DC home. No further PT indicated as pt is modified independent with mobility. PT signing off.     Follow Up Recommendations No PT follow up    Equipment Recommendations  None recommended by PT    Recommendations for Other Services       Precautions / Restrictions Precautions Precautions: None Precaution Comments: pt denies h/o falls in past year Restrictions Weight Bearing Restrictions: No      Mobility  Bed Mobility Overal bed mobility: Modified Independent             General bed mobility comments: HOB up 30*, used rail  Transfers Overall transfer level: Modified independent Equipment used: None Transfers: Sit to/from Stand           General transfer comment: used bedrail for single UE support to push up to stand  Ambulation/Gait Ambulation/Gait assistance: Modified independent (Device/Increase time) Gait Distance (Feet): 85 Feet Assistive device: Straight cane Gait Pattern/deviations: Step-through pattern;Decreased stride length Gait velocity: decr   General Gait Details: steady with cane, SaO2 96% on room air, no loss of balance, 3/4 dyspnea at end of walk, distance limited by SOB, pt states at baseline she is able to  ambulate farther  [employer] Mobility    Modified Rankin (Stroke Patients Only)       Balance Overall balance assessment: Modified Independent                                           Pertinent Vitals/Pain Pain Assessment: No/denies pain    Home Living Family/patient expects to be discharged to:: Private residence Living Arrangements: Children Available Help at Discharge: Family;Available 24 hours/day   Home Access: Stairs to enter   Entrance Stairs-Number of Steps: 1 Home Layout: One level Home Equipment: Cane - single point;Grab bars - tub/shower      Prior Function Level of Independence: Independent with assistive device(s)         Comments: walks with SPC, denies h/o falls in past 1 year     Hand Dominance        Extremity/Trunk Assessment   Upper Extremity Assessment Upper Extremity Assessment: Overall WFL for tasks assessed    Lower Extremity Assessment Lower Extremity Assessment: Overall WFL for tasks assessed    Cervical / Trunk Assessment Cervical / Trunk Assessment: Normal  Communication   Communication: No difficulties  Cognition Arousal/Alertness: Awake/alert Behavior During Therapy: WFL for tasks assessed/performed Overall Cognitive Status: Within Functional Limits for tasks assessed  General Comments      Exercises     Assessment/Plan    PT Assessment Patent does not need any further PT services  PT Problem List         PT Treatment Interventions      PT Goals (Current goals can be found in the Care Plan section)  Acute Rehab PT Goals Patient Stated Goal: hang out with her grandkids PT Goal Formulation: All assessment and education complete, DC therapy    Frequency     Barriers to discharge        Co-evaluation               AM-PAC PT "6 Clicks" Mobility  Outcome Measure Help needed turning from your back to  your side while in a flat bed without using bedrails?: None Help needed moving from lying on your back to sitting on the side of a flat bed without using bedrails?: None Help needed moving to and from a bed to a chair (including a wheelchair)?: None Help needed standing up from a chair using your arms (e.g., wheelchair or bedside chair)?: None Help needed to walk in hospital room?: None Help needed climbing 3-5 steps with a railing? : None 6 Click Score: 24    End of Session   Activity Tolerance: Patient tolerated treatment well;Patient limited by fatigue Patient left: in chair;with call bell/phone within reach Nurse Communication: Mobility status      Time: 7737-3668 PT Time Calculation (min) (ACUTE ONLY): 16 min   Charges:   PT Evaluation $PT Eval Low Complexity: 1 Low          Philomena Doheny PT 05/30/2018  Acute Rehabilitation Services Pager [phone] Office [phone]

## 2018-05-30 NOTE — Progress Notes (Signed)
PROGRESS NOTE  [name]  CXK:[id number] DOB: 06-06-1965 DOA: 05/29/2018 PCP: Alveda Reasons, MD   Brief Narrative: [name] is a 53 y.o. female with a history of ovarian and endometrial CA in remission s/p chemotherapy, recurrent DVT/PE on lifelong xarelto, recently diagnosed SLE and/or scleroderma not yet on therapy, GERD, and morbid obesity (BMI 65) who presented to the ED due to progressive shortness of breath and yellow sputum for a few days in the setting of a month of cough with blood-tinged sputum. She had been treated by her PCP after a negative CXR with a albuterol inhaler and prednisone for bronchitis. She fellt no significant improvement and when she started feeling more short of breath with changing sputum color to yellow, she presented to the ED. WBC 8.4 with normal diff, hemoglobin 10.7, creatinine 1.02, BUN 21, BNP 235. CXR demonstrated right basilar airspace opacity. CTA chest was subsequently performed confirming airspace opacity in R > L lower lobes, nondiagnostic for PE. Levaquin was started and she was admitted.  Assessment & Plan: Principal Problem:   Community acquired pneumonia Active Problems:   Long term current use of anticoagulant therapy   Scleroderma (HCC)   Hemoptysis   Normocytic anemia   CAP (community acquired pneumonia)  Multifocal CAP, mostly in RLL, and influenza A infection causing acute bronchitis: Never smoker.  - Continue levaquin started 2/22, will continue x5 days - Started tamiflu 2/22 PM, so last dose will be 2/27 AM.  - Wheezing improving, will taper steroids today with possibility for transition to po prednisone in 24-48 hours. - Continue nebulizer therapy while admitted. If continues to get significant benefit from this, may provide DME at discharge. Regardless, will benefit from formal PFTs as an outpatient. If hemoptysis continues, would need pulmonology.  - Check PCT in AM  History of PE, DVT: Recurrent in the setting of  malignancy. On lifelong anticoagulation. She is clear that she has not missed doses.  - Continue xarelto. Though CTA chest was unable to rule out PE due to poor study quality, she is neither tachycardic nor hypoxic and has alternative explanation for symptoms she does have, so will not pursue further work up at this time.  Scleroderma/SLE: Dx'ed by rheumatology at [facility], but difficult to follow up without insurance.  - Hydroxychloroquine was recommended, though she would need ongoing surveillance of labs and retinal exams for which she cannot pay. Will not start this here.  Hemoptysis:  - Monitor clinically. No findings on CTA chest to give concern for mass, alveolar hemorrhage, metastatic or granulomatous disease.   GERD: With small-to-moderate hiatal hernia noted on CT here.  - PPI 40mg  po BID  Stage IIIC right ovarian and synchronous stage IA endometrial CA: s/p chemotherapy. Per patient felt to be in remission. Followed by Dr. Denman George, noted to have normal CA125 and NED on CT in Dec 2019. - Outpatient follow up.   Acute blood loss anemia: Baseline hgb appears to be 12-13. 10.7 on admission thought to be due to hemoptysis, though anemia of chronic disease also possible.  - Check anemia panel in AM  Morbid obesity: BMI 65.  - Weight loss recommended.   Gout:  - No current flare, avoid NSAIDs with anticoagulation.  DVT prophylaxis: Xarelto Code Status: Full Family Communication: None at bedside Disposition Plan: Home once sustaining improvement with tapered steroids.  Consultants:   None  Procedures:   None  Antimicrobials:  Levaquin 2/22 - 2/26  Tamiflu 2/22 - 2/27  Subjective: Feels better than admission, dyspnea is improved and wheezing has resolved. Still feels very fatigued, though this has also been her baseline for the past several months.   Objective: Vitals:   05/30/18 0555 05/30/18 0800 05/30/18 0822 05/30/18 0825  BP: 126/74     Pulse: 66     Resp:  (!) 21 18    Temp: 98 F (36.7 C)     TempSrc: Oral     SpO2: 95%  94% 94%  Weight:      Height:        Intake/Output Summary (Last 24 hours) at 05/30/2018 1129 Last data filed at 05/30/2018 0800 Gross per 24 hour  Intake 1782.79 ml  Output 550 ml  Net 1232.79 ml   Filed Weights   05/29/18 1619  Weight: (!) 179.6 kg    Gen: Very pleasant morbidly obese female in no distress  Pulm: Non-labored breathing with end-expiratory wheezing but good air movement. CV: Regular rate and rhythm. No murmur, rub, or gallop. No JVD, no definite pedal edema. GI: Abdomen soft, non-tender, non-distended, with normoactive bowel sounds. No organomegaly or masses felt. Ext: Warm, no deformities Skin: No rashes, lesions or ulcers Neuro: Alert and oriented. No focal neurological deficits. Psych: Judgement and insight appear normal. Mood & affect appropriate.   Data Reviewed: I have personally reviewed following labs and imaging studies  CBC: Recent Labs  Lab 05/29/18 0958 05/30/18 0648  WBC 8.4 6.8  NEUTROABS 6.8  --   HGB 10.7* 10.2*  HCT 34.7* 32.6*  MCV 95.3 95.0  PLT 170 829   Basic Metabolic Panel: Recent Labs  Lab 05/29/18 0958 05/30/18 0648  NA 140 138  K 3.7 4.3  CL 108 106  CO2 24 24  GLUCOSE 103* 154*  BUN 21* 22*  CREATININE 1.02* 1.05*  CALCIUM 8.5* 8.5*   GFR: Estimated Creatinine Clearance: 103.7 mL/min (A) (by C-G formula based on SCr of 1.05 mg/dL (H)). Liver Function Tests: Recent Labs  Lab 05/29/18 0958  AST 15  ALT 12  ALKPHOS 83  BILITOT 0.3  PROT 6.3*  ALBUMIN 3.5   No results for input(s): LIPASE, AMYLASE in the last 168 hours. No results for input(s): AMMONIA in the last 168 hours. Coagulation Profile: No results for input(s): INR, PROTIME in the last 168 hours. Cardiac Enzymes: No results for input(s): CKTOTAL, CKMB, CKMBINDEX, TROPONINI in the last 168 hours. BNP (last 3 results) No results for input(s): PROBNP in the last 8760  hours. HbA1C: No results for input(s): HGBA1C in the last 72 hours. CBG: No results for input(s): GLUCAP in the last 168 hours. Lipid Profile: No results for input(s): CHOL, HDL, LDLCALC, TRIG, CHOLHDL, LDLDIRECT in the last 72 hours. Thyroid Function Tests: No results for input(s): TSH, T4TOTAL, FREET4, T3FREE, THYROIDAB in the last 72 hours. Anemia Panel: No results for input(s): VITAMINB12, FOLATE, FERRITIN, TIBC, IRON, RETICCTPCT in the last 72 hours. Urine analysis:    Component Value Date/Time   COLORURINE AMBER (A) 03/14/2015 2118   APPEARANCEUR CLOUDY (A) 03/14/2015 2118   LABSPEC 1.015 04/23/2015 1247   PHURINE 5.0 04/23/2015 1247   PHURINE 5.5 03/14/2015 2118   GLUCOSEU Negative 04/23/2015 1247   HGBUR Negative 04/23/2015 1247   HGBUR MODERATE (A) 03/14/2015 2118   BILIRUBINUR Color Interference 04/23/2015 1247   KETONESUR Negative 04/23/2015 [place] 03/14/2015 2118   PROTEINUR Color Interference 04/23/2015 1247   PROTEINUR NEGATIVE 03/14/2015 2118   UROBILINOGEN Color Interference 04/23/2015 1247  NITRITE Color Interference 04/23/2015 1247   NITRITE NEGATIVE 03/14/2015 2118   LEUKOCYTESUR Color Interference 04/23/2015 1247   No results found for this or any previous visit (from the past 240 hour(s)).    Radiology Studies: Dg Chest 2 View  Result Date: 05/29/2018 CLINICAL DATA:  Short of breath EXAM: CHEST - 2 VIEW COMPARISON:  04/22/2018 FINDINGS: Right jugular Port-A-Cath is stable with its tip at the cavoatrial junction. The heart is moderately enlarged. There is patchy airspace disease at the right lung base. Lungs are otherwise clear. No pneumothorax or pleural effusion. IMPRESSION: Right basilar airspace disease. Followup PA and lateral chest X-ray is recommended in 3-4 weeks following trial of antibiotic therapy to ensure resolution and exclude underlying malignancy. Electronically Signed   By: Marybelle Killings M.D.   On: 05/29/2018 10:37   Ct  Angio Chest Pe W/cm &/or Wo Cm  Result Date: 05/29/2018 CLINICAL DATA:  53 year old with chest pain. Intermediate/high probability for acute coronary syndrome, pulmonary embolism or acute aortic syndrome. EXAM: CT ANGIOGRAPHY CHEST WITH CONTRAST TECHNIQUE: Multidetector CT imaging of the chest was performed using the standard protocol during bolus administration of intravenous contrast. Multiplanar CT image reconstructions and MIPs were obtained to evaluate the vascular anatomy. CONTRAST:  137mL ISOVUE-370 IOPAMIDOL (ISOVUE-370) INJECTION 76% COMPARISON:  Chest CT 11/23/2016 FINDINGS: Cardiovascular: Poor opacification of the pulmonary arteries and aorta. Pulmonary embolism cannot be evaluated on this examination. Limited evaluation for of the thoracic aorta but no evidence for aortic aneurysm. There is a Port-A-Cath with the tip near the superior cavoatrial junction. No significant pericardial fluid. Mediastinum/Nodes: Again noted is a small to moderate-sized hiatal hernia. No significant chest lymphadenopathy. No significant axillary lymph node enlargement. Lungs/Pleura: Trachea and mainstem bronchi are patent. Patchy airspace disease in both lower lobes, right side greater than left. Small amount of airspace disease near the right lung apex. No large pleural effusions. Patchy airspace disease in the left upper lobe. Upper Abdomen: No acute abnormality in the visualized upper abdominal structures. Musculoskeletal: No acute bone abnormality. Review of the MIP images confirms the above findings. IMPRESSION: 1. Bilateral airspace disease. Findings are most compatible with multifocal pneumonia. 2. Nondiagnostic study for pulmonary embolism. Pulmonary arteries are not adequately opacified on this examination. 3. Limited evaluation of the thoracic aorta but no evidence for aortic aneurysm. 4. Small to moderate sized hiatal hernia. Electronically Signed   By: Markus Daft M.D.   On: 05/29/2018 12:44    Scheduled  Meds: . albuterol  2.5 mg Nebulization TID  . methylPREDNISolone sodium succinate  60 mg Intravenous Q8H  . oseltamivir  75 mg Oral BID  . oxybutynin  5 mg Oral QHS  . pantoprazole  40 mg Oral BID  . rivaroxaban  20 mg Oral Q supper   Continuous Infusions: . sodium chloride 75 mL/hr at 05/30/18 0800  . levofloxacin (LEVAQUIN) IV       LOS: 1 day   Time spent: 25 minutes.  Patrecia Pour, MD [facility] [email or web address] Password [facility] 05/30/2018, 11:29 AM

## 2018-05-31 DIAGNOSIS — D649 Anemia, unspecified: Secondary | ICD-10-CM

## 2018-05-31 DIAGNOSIS — Z7901 Long term (current) use of anticoagulants: Secondary | ICD-10-CM

## 2018-05-31 DIAGNOSIS — J111 Influenza due to unidentified influenza virus with other respiratory manifestations: Secondary | ICD-10-CM

## 2018-05-31 DIAGNOSIS — R042 Hemoptysis: Secondary | ICD-10-CM

## 2018-05-31 DIAGNOSIS — J208 Acute bronchitis due to other specified organisms: Secondary | ICD-10-CM

## 2018-05-31 DIAGNOSIS — M349 Systemic sclerosis, unspecified: Secondary | ICD-10-CM

## 2018-05-31 LAB — CBC
HCT: 34.2 % — ABNORMAL LOW (ref 36.0–46.0)
Hemoglobin: 10.3 g/dL — ABNORMAL LOW (ref 12.0–15.0)
MCH: 29 pg (ref 26.0–34.0)
MCHC: 30.1 g/dL (ref 30.0–36.0)
MCV: 96.3 fL (ref 80.0–100.0)
Platelets: 176 10*3/uL (ref 150–400)
RBC: 3.55 MIL/uL — ABNORMAL LOW (ref 3.87–5.11)
RDW: 13.8 % (ref 11.5–15.5)
WBC: 7.4 10*3/uL (ref 4.0–10.5)
nRBC: 0 % (ref 0.0–0.2)

## 2018-05-31 LAB — IRON AND TIBC
Iron: 49 ug/dL (ref 28–170)
Saturation Ratios: 18 % (ref 10.4–31.8)
TIBC: 269 ug/dL (ref 250–450)
UIBC: 220 ug/dL

## 2018-05-31 LAB — BASIC METABOLIC PANEL
Anion gap: 6 (ref 5–15)
BUN: 29 mg/dL — ABNORMAL HIGH (ref 6–20)
CALCIUM: 8 mg/dL — AB (ref 8.9–10.3)
CO2: 25 mmol/L (ref 22–32)
Chloride: 108 mmol/L (ref 98–111)
Creatinine, Ser: 1.3 mg/dL — ABNORMAL HIGH (ref 0.44–1.00)
GFR calc Af Amer: 54 mL/min — ABNORMAL LOW (ref >60)
GFR calc non Af Amer: 47 mL/min — ABNORMAL LOW (ref >60)
Glucose, Bld: 169 mg/dL — ABNORMAL HIGH (ref 70–99)
Potassium: 4.4 mmol/L (ref 3.5–5.1)
SODIUM: 139 mmol/L (ref 135–145)

## 2018-05-31 LAB — RETICULOCYTES
Immature Retic Fract: 13.1 % (ref 2.3–15.9)
RBC.: 3.55 MIL/uL — ABNORMAL LOW (ref 3.87–5.11)
Retic Count, Absolute: 71 10*3/uL (ref 19.0–186.0)
Retic Ct Pct: 2 % (ref 0.4–3.1)

## 2018-05-31 LAB — FERRITIN: Ferritin: 73 ng/mL (ref 11–307)

## 2018-05-31 LAB — PROCALCITONIN: Procalcitonin: <0.1 ng/mL

## 2018-05-31 MED ORDER — OSELTAMIVIR PHOSPHATE 75 MG PO CAPS: 75.0000 mg | ORAL_CAPSULE | Freq: Two times a day (BID) | ORAL | 0 refills | Status: DC

## 2018-05-31 MED ORDER — ALBUTEROL SULFATE (2.5 MG/3ML) 0.083% IN NEBU: 2.5000 mg | INHALATION_SOLUTION | RESPIRATORY_TRACT | 0 refills | Status: DC | PRN

## 2018-05-31 MED ORDER — HEPARIN SOD (PORK) LOCK FLUSH 100 UNIT/ML IV SOLN
500.0000 [IU] | INTRAVENOUS | Status: AC | PRN
  Administered 2018-05-31: 500 [IU]

## 2018-05-31 MED ORDER — ALBUTEROL SULFATE (2.5 MG/3ML) 0.083% IN NEBU: 2.5000 mg | INHALATION_SOLUTION | Freq: Two times a day (BID) | RESPIRATORY_TRACT | Status: DC

## 2018-05-31 MED ORDER — PREDNISONE 20 MG PO TABS: 40.0000 mg | ORAL_TABLET | Freq: Every day | ORAL | Status: DC

## 2018-05-31 MED ORDER — PREDNISONE 20 MG PO TABS: 40.0000 mg | ORAL_TABLET | Freq: Every day | ORAL | 0 refills | Status: DC

## 2018-05-31 MED FILL — OSELTAMIVIR PHOSPHATE 75 MG: 75 | 3 days supply | Qty: 6 | Fill #0

## 2018-05-31 NOTE — Discharge Instructions (Signed)
How to Use a Nebulizer, Adult    A nebulizer is a device that turns liquid medicine into a mist (vapor) that you can breathe in (inhale). You may need to use a nebulizer if you have a breathing illness, such as asthma or pneumonia.  There are different kinds of nebulizers. With some, you breathe in through a mouthpiece. With others, a mask fits over your nose and mouth.  Risks and complications  Using a nebulizer that does not fit right or is not cleaned right can lead to the following complications:   Infection.   Eye irritation.   Delivery of too much medicine or not enough medicine.   Mouth irritation.  How to prepare before using a nebulizer  Take these steps before using your nebulizer:  1. Check your medicine. Make sure it has not expired and is not damaged in any way.  2. Wash your hands with soap and water.  3. Put all of the parts of your nebulizer on a sturdy, flat surface. Make sure all of the tubing is connected.  4. Measure the liquid medicine according to instructions from your health care provider. Pour the liquid into the part of the nebulizer that holds the medicine (reservoir).  5. Attach the mouthpiece or mask.  6. Test the nebulizer by turning it on to make sure that a spray comes out. Then, turn it off.  How to use a nebulizer         1. Sit down and relax.  2. If your nebulizer has a mask, put it over your nose and mouth. It should fit somewhat snugly, with no gaps around the nose or cheeks where medicine could escape. If you use a mouthpiece, put it in your mouth. Press your lips firmly around the mouthpiece.  3. Turn on the nebulizer.  4. Breathe out (exhale).  5. Some nebulizers have a finger valve. If yours does, cover up the air hole so the air gets to the nebulizer.  6. Once the medicine begins to mist out, take slow, deep breaths. If there is a finger valve, release it at the end of your breath.  7. Continue taking slow, deep breaths until the medicine in the nebulizer is gone and no  vapor appears.  Be sure to stop the machine at any time if you start coughing or if the medicine foams or bubbles.  How to clean a nebulizer  The nebulizer and all of its parts must be kept very clean. If the nebulizer and its parts are not cleaned properly, bacteria can grow inside of them. If you inhale the bacteria, you can get sick. Follow the manufacturer's instructions for cleaning your nebulizer. For most nebulizers, you should follow these guidelines:   Clean the mouthpiece or mask and the medicine cup by:  ? Rinsing them after each use. Use sterile or distilled water.  ? Washing them 1-2 times a week using soap and warm water.   Do not wash the tubing.   After you rinse or wash them, place the parts on a clean towel and let them dry completely. After they dry, reconnect the pieces and turn the nebulizer on without any medicine in it. Doing this will blow air through the equipment to help dry it out.   Store the nebulizer in a clean and dust-free place.   Check the filter at least one time every week. Replace it if it looks dirty.  Contact a health care provider if:   You   continue to have trouble breathing.   You have trouble using the nebulizer.   Your breathing gets worse during a nebulizer treatment.   Your nebulizer stops working, foams, or does not create a mist after you add medicine and turn it on.  Summary   A nebulizer is a device that turns liquid medicine into a mist (vapor) that you can breathe in (inhale).   Measure the liquid medicine according to instructions from your health care provider. Pour the liquid into the part of the nebulizer that holds the medicine (reservoir).   Once the medicine begins to mist out, take slow, deep breaths.   Rinse or wash the mouthpiece and the medicine cup after each use, and allow them to dry completely.  This information is not intended to replace advice given to you by your health care provider. Make sure you discuss any questions you have with  your health care provider.  Document Released: 03/12/2009 Document Revised: 10/06/2017 Document Reviewed: 09/29/2015  Elsevier Interactive Patient Education  2019 [employer]

## 2018-05-31 NOTE — Progress Notes (Signed)
Received call for [facility] program. Provided PATH program over phone to [facility]-successful. No further CM needs.

## 2018-05-31 NOTE — Discharge Summary (Signed)
Physician Discharge Summary  [name] YSA:[id number] DOB: 01-17-1966 DOA: 05/29/2018  PCP: Alveda Reasons, MD  Admit date: 05/29/2018 Discharge date: 05/31/2018  Admitted From: Home Disposition: Home   Recommendations for Outpatient Follow-up:  1. Follow up with PCP in 1-2 weeks 2. Consider formal PFTs, discharged with nebulizer to aid recovery from bronchitis, though no Dx obstructive lung disease or tobacco history.  Home Health: None Equipment/Devices: Nebulizer and albuterol neb. Discharge Condition: Stable CODE STATUS: Full Diet recommendation: Heart healthy  Brief/Interim Summary: [name] is a 53 y.o. 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 She had been treated by her PCP after a negative CXR with an albuterol inhaler and prednisone for bronchitis. She fellt no significant improvement and when she started feeling more short of breath with changing sputum color to yellow, she presented to the ED. WBC 8.4 with normal diff, hemoglobin 10.7, creatinine 1.02, BUN 21, BNP 235. CXR demonstrated right basilar airspace opacity. CTA chest was subsequently performed confirming airspace opacity in R > L lower lobes, nondiagnostic for PE. She was found to have influenza A. Tamiflu and levaquin were started with IV solumedrol, and she was admitted. Wheezing has improved and the patient has no hypoxia with ambulation, feels stable and improved for discharge.   Discharge Diagnoses:  Principal Problem:   Community acquired pneumonia Active Problems:   Long term current use of anticoagulant therapy   Scleroderma (HCC)   Hemoptysis   Normocytic  anemia   CAP (community acquired pneumonia)  Pneumonia: Felt to be ruled out. Opacities on CTA chest noted, no typical lobar distribution and if this were multifocal PNA, would expect more fulminant clinical picture.  - Levaquin started 2/22, will discontinue with undetectable procalcitonin and alternative explanation (flu) for symptoms.   Influenza A infection causing acute bronchitis: Never smoker.  - Started tamiflu 2/22 PM, so last dose will be 2/27 AM.  - Wheezing improving while tapering steroids. Will continue po prednisone at discharge.  - Continue nebulizer therapy, has gotten significant benefit from this, will provide DME at discharge.  - Will benefit from formal PFTs as an outpatient.  Hemoptysis: Currently stable/improving, though precedes known infection.  - Monitor clinically. No findings on CTA chest to give concern for mass, alveolar hemorrhage, metastatic or granulomatous disease. If hemoptysis continues, would need pulmonology.  - Hgb stable, continue anticoagulation. Return precautions reviewed  History of PE, DVT: Recurrent in the setting of malignancy. On lifelong anticoagulation. She is clear that she has not missed doses.  - Continue xarelto. Though CTA chest was unable to rule out PE due to poor study quality, she is neither tachycardic nor hypoxic and has alternative explanation for the symptoms she does have, so will not pursue further work up at this time.  Scleroderma/SLE: Dx'ed by rheumatology at [facility], but difficult to follow up without insurance.  - Hydroxychloroquine was recommended. Will not start this here. - Re: hydroxychloroquine monitoring: TSH has been historically within normal limts on several checks. LFTs here wnl. CrCl >36ml/min. Would need baseline and regular annual retinal exams.  GERD: With small-to-moderate hiatal hernia noted on CT here.  - PPI 40mg  po BID  Stage IIIC right ovarian and synchronous stage IA endometrial CA: s/p  chemotherapy. Per patient felt to be in remission. Followed by Dr. Denman George, noted to have normal CA125 and NED on CT in Dec 2019. - Outpatient follow up as scheduled.  Acute blood loss anemia: Baseline hgb appears to be 12-13. 10.7 on admission thought to be due to hemoptysis. Iron studies are normal.  Morbid obesity: BMI 65.  - Weight loss recommended.   Gout:  - No current flare, avoid NSAIDs with anticoagulation.  Discharge Instructions Discharge Instructions    DME Nebulizer machine   Complete by:  As directed    Patient needs a nebulizer to treat with the following condition:   Bronchitis Wheezing Influenza     Diet - low sodium heart healthy   Complete by:  As directed    Discharge instructions   Complete by:  As directed    You were admitted for wheezing, shortness of breath and symptoms of infection and found to have influenza. This has been treated with tamiflu. There was also concern for possible pneumonia initially though this is not felt to be likely after complete work up. You are stable for discharge with the following recommendations:  - Continue tamiflu twice daily for 3 more days and continue prednisone in the morning for 3 more mornings.  - If your symptoms worsen (specifically wheezing, shortness of breath or coughing up blood) seek medical attention right away. Otherwise follow up with Dr. Mingo Amber in the next 1-2 weeks.   Increase activity slowly   Complete by:  As directed      Allergies as of 05/31/2018      Reactions   Penicillins Rash   Has patient had a PCN reaction causing immediate rash, facial/tongue/throat swelling, SOB or lightheadedness with hypotension: no Has patient had a PCN reaction causing severe rash involving mucus membranes or skin necrosis: no Has patient had a PCN reaction that required hospitalization in the hospital at the time Has patient had a PCN reaction occurring within the last 10 years: no If all of the above answers are "NO",  then may proceed with Cephalosporin use.      Medication List    TAKE these medications   acetaminophen 500 MG tablet Commonly known as:  TYLENOL Take 1,000 mg by mouth every 6 (six) hours as needed for moderate pain.   albuterol 108 (90 Base) MCG/ACT inhaler Commonly known as:  PROVENTIL HFA;VENTOLIN HFA Inhale 2 puffs into the lungs every 6 (six) hours as needed for wheezing or shortness of breath. Please instruct patient in usage. What changed:  Another medication with the same name was added. Make sure you understand how and when to take each.   albuterol (2.5 MG/3ML) 0.083% nebulizer solution Commonly known as:  PROVENTIL Take 3 mLs (2.5 mg total) by nebulization every 4 (four) hours as needed for wheezing or shortness of breath. What changed:  You were already taking a medication with the same name, and this prescription was added. Make sure you understand how and when to take each.   cetirizine 10 MG tablet Commonly known as:  ZYRTEC Take 1 tablet (10 mg total) by mouth daily as needed for allergies (itching).   dexlansoprazole 60 MG capsule Commonly known as:  DEXILANT Take 1 capsule (60 mg total) by mouth 2 (two) times daily.   gabapentin 100 MG capsule Commonly known as:  NEURONTIN Take 100 mg by mouth at bedtime as needed (nerve pain).   oseltamivir 75  MG capsule Commonly known as:  TAMIFLU Take 1 capsule (75 mg total) by mouth 2 (two) times daily.   oxybutynin 5 MG 24 hr tablet Commonly known as:  DITROPAN-XL Take 5 mg by mouth at bedtime.   predniSONE 20 MG tablet Commonly known as:  DELTASONE Take 2 tablets (40 mg total) by mouth daily with breakfast. Start taking on:  June 01, 2018 What changed:    medication strength  how much to take  how to take this  when to take this  additional instructions   rivaroxaban 20 MG Tabs tablet Commonly known as:  XARELTO Take 1 tablet (20 mg total) by mouth daily with supper. What changed:  when to take  this   triamcinolone cream 0.1 % Commonly known as:  KENALOG APPLY TO THE AFFECTED AREA(S) TWICE DAILY AS NEEDED What changed:    how much to take  how to take this  when to take this  reasons to take this            Durable Medical Equipment  (From admission, onward)         Start     Ordered   05/31/18 0000  DME Nebulizer machine    Question Answer Comment  Patient needs a nebulizer to treat with the following condition Bronchitis   Patient needs a nebulizer to treat with the following condition Wheezing   Patient needs a nebulizer to treat with the following condition Influenza      05/31/18 1034         Follow-up Information    Alveda Reasons, MD. Schedule an appointment as soon as possible for a visit in 1 week(s).   Specialty:  Family Medicine Contact information: [place] [address] [phone]          Allergies  Allergen Reactions  . Penicillins Rash    Has patient had a PCN reaction causing immediate rash, facial/tongue/throat swelling, SOB or lightheadedness with hypotension: no Has patient had a PCN reaction causing severe rash involving mucus membranes or skin necrosis: no Has patient had a PCN reaction that required hospitalization in the hospital at the time Has patient had a PCN reaction occurring within the last 10 years: no If all of the above answers are "NO", then may proceed with Cephalosporin use.     Consultations:  None  Procedures/Studies: Dg Chest 2 View  Result Date: 05/29/2018 CLINICAL DATA:  Short of breath EXAM: CHEST - 2 VIEW COMPARISON:  04/22/2018 FINDINGS: Right jugular Port-A-Cath is stable with its tip at the cavoatrial junction. The heart is moderately enlarged. There is patchy airspace disease at the right lung base. Lungs are otherwise clear. No pneumothorax or pleural effusion. IMPRESSION: Right basilar airspace disease. Followup PA and lateral chest X-ray is recommended in 3-4  weeks following trial of antibiotic therapy to ensure resolution and exclude underlying malignancy. Electronically Signed   By: Marybelle Killings M.D.   On: 05/29/2018 10:37   Ct Angio Chest Pe W/cm &/or Wo Cm  Result Date: 05/29/2018 CLINICAL DATA:  53 year old with chest pain. Intermediate/high probability for acute coronary syndrome, pulmonary embolism or acute aortic syndrome. EXAM: CT ANGIOGRAPHY CHEST WITH CONTRAST TECHNIQUE: Multidetector CT imaging of the chest was performed using the standard protocol during bolus administration of intravenous contrast. Multiplanar CT image reconstructions and MIPs were obtained to evaluate the vascular anatomy. CONTRAST:  138mL ISOVUE-370 IOPAMIDOL (ISOVUE-370) INJECTION 76% COMPARISON:  Chest CT 11/23/2016 FINDINGS: Cardiovascular: Poor opacification of  the pulmonary arteries and aorta. Pulmonary embolism cannot be evaluated on this examination. Limited evaluation for of the thoracic aorta but no evidence for aortic aneurysm. There is a Port-A-Cath with the tip near the superior cavoatrial junction. No significant pericardial fluid. Mediastinum/Nodes: Again noted is a small to moderate-sized hiatal hernia. No significant chest lymphadenopathy. No significant axillary lymph node enlargement. Lungs/Pleura: Trachea and mainstem bronchi are patent. Patchy airspace disease in both lower lobes, right side greater than left. Small amount of airspace disease near the right lung apex. No large pleural effusions. Patchy airspace disease in the left upper lobe. Upper Abdomen: No acute abnormality in the visualized upper abdominal structures. Musculoskeletal: No acute bone abnormality. Review of the MIP images confirms the above findings. IMPRESSION: 1. Bilateral airspace disease. Findings are most compatible with multifocal pneumonia. 2. Nondiagnostic study for pulmonary embolism. Pulmonary arteries are not adequately opacified on this examination. 3. Limited evaluation of the  thoracic aorta but no evidence for aortic aneurysm. 4. Small to moderate sized hiatal hernia. Electronically Signed   By: Markus Daft M.D.   On: 05/29/2018 12:44    Subjective: Feels much better, breathing at baseline, still some cough and dyspnea with significant exertion but not severe. Wants to go home. Has been getting benefit from nebulizers. No wheezing currently.   Discharge Exam: Vitals:   05/31/18 0458 05/31/18 0849  BP: 139/72   Pulse: (!) 58   Resp: 18   Temp: 97.8 F (36.6 C)   SpO2: 97% 98%   General: Very pleasant female in no distress Cardiovascular: RRR, S1/S2 +, no rubs, no gallops Respiratory: Nonlabored on room air with normal expiratory phase, scant end-expiratory wheeze sporadically. Abdominal: Soft, NT, ND, bowel sounds + Extremities: No edema, no cyanosis  Labs: BNP (last 3 results) Recent Labs    05/29/18 0958  BNP 546.5*   Basic Metabolic Panel: Recent Labs  Lab 05/29/18 0958 05/30/18 0648 05/31/18 0409  NA 140 138 139  K 3.7 4.3 4.4  CL 108 106 108  CO2 24 24 25   GLUCOSE 103* 154* 169*  BUN 21* 22* 29*  CREATININE 1.02* 1.05* 1.30*  CALCIUM 8.5* 8.5* 8.0*   Liver Function Tests: Recent Labs  Lab 05/29/18 0958  AST 15  ALT 12  ALKPHOS 83  BILITOT 0.3  PROT 6.3*  ALBUMIN 3.5   No results for input(s): LIPASE, AMYLASE in the last 168 hours. No results for input(s): AMMONIA in the last 168 hours. CBC: Recent Labs  Lab 05/29/18 0958 05/30/18 0648 05/31/18 0409  WBC 8.4 6.8 7.4  NEUTROABS 6.8  --   --   HGB 10.7* 10.2* 10.3*  HCT 34.7* 32.6* 34.2*  MCV 95.3 95.0 96.3  PLT 170 173 176   Cardiac Enzymes: No results for input(s): CKTOTAL, CKMB, CKMBINDEX, TROPONINI in the last 168 hours. BNP: Invalid input(s): POCBNP CBG: No results for input(s): GLUCAP in the last 168 hours. D-Dimer No results for input(s): DDIMER in the last 72 hours. Hgb A1c No results for input(s): HGBA1C in the last 72 hours. Lipid Profile No  results for input(s): CHOL, HDL, LDLCALC, TRIG, CHOLHDL, LDLDIRECT in the last 72 hours. Thyroid function studies No results for input(s): TSH, T4TOTAL, T3FREE, THYROIDAB in the last 72 hours.  Invalid input(s): FREET3 Anemia work up Recent Labs    05/31/18 0409  FERRITIN 73  TIBC 269  IRON 49  RETICCTPCT 2.0   Urinalysis    Component Value Date/Time   COLORURINE AMBER (A) 03/14/2015 2118  APPEARANCEUR CLOUDY (A) 03/14/2015 2118   LABSPEC 1.015 04/23/2015 1247   PHURINE 5.0 04/23/2015 1247   PHURINE 5.5 03/14/2015 2118   GLUCOSEU Negative 04/23/2015 1247   HGBUR Negative 04/23/2015 1247   HGBUR MODERATE (A) 03/14/2015 2118   BILIRUBINUR Color Interference 04/23/2015 1247   KETONESUR Negative 04/23/2015 1247   KETONESUR NEGATIVE 03/14/2015 2118   PROTEINUR Color Interference 04/23/2015 1247   PROTEINUR NEGATIVE 03/14/2015 2118   UROBILINOGEN Color Interference 04/23/2015 1247   NITRITE Color Interference 04/23/2015 1247   NITRITE NEGATIVE 03/14/2015 2118   LEUKOCYTESUR Color Interference 04/23/2015 1247    Microbiology No results found for this or any previous visit (from the past 240 hour(s)).  Time coordinating discharge: Approximately 40 minutes  Patrecia Pour, MD  [facility] 05/31/2018, 3:47 PM Pager [phone]

## 2018-05-31 NOTE — Care Management Note (Signed)
Case Management Note  Patient Details  Name: [name] MRN: [id number] Date of Birth: [birth year]  Subjective/Objective:                  DISCHARGE PLANNING  Action/Plan: DME-ADVANCED HHC-NEBULIZER  Expected Discharge Date:  05/31/18               Expected Discharge Plan:  Home/Self Care  In-House Referral:     Discharge planning Services  CM Consult  Post Acute Care Choice:  Durable Medical Equipment Choice offered to:  Patient  DME Arranged:  Nebulizer machine DME Agency:  [place]:    [facility] Agency:     Status of Service:  Completed, signed off  If discussed at [place] of Stay Meetings, dates discussed:    Additional Comments:  Leeroy Cha, RN 05/31/2018, 10:52 AM

## 2018-06-04 ENCOUNTER — Ambulatory Visit
Admission: RE | Admit: 2018-06-04 | Discharge: 2018-06-04 | Disposition: A | Discharge status: Home / Self Care | Payer: No Typology Code available for payment source | Source: Ambulatory Visit | Attending: Family Medicine | Admitting: Family Medicine

## 2018-06-04 DIAGNOSIS — R053 Chronic cough: Secondary | ICD-10-CM

## 2018-06-04 DIAGNOSIS — R042 Hemoptysis: Secondary | ICD-10-CM

## 2018-06-04 DIAGNOSIS — R05 Cough: Secondary | ICD-10-CM

## 2018-06-09 ENCOUNTER — Encounter: Payer: Self-pay | Admitting: Family Medicine

## 2018-06-09 ENCOUNTER — Other Ambulatory Visit: Payer: Self-pay

## 2018-06-09 ENCOUNTER — Ambulatory Visit (INDEPENDENT_AMBULATORY_CARE_PROVIDER_SITE_OTHER): Payer: Medicaid Other | Admitting: Family Medicine

## 2018-06-09 DIAGNOSIS — R042 Hemoptysis: Secondary | ICD-10-CM | POA: Diagnosis not present

## 2018-06-09 DIAGNOSIS — R05 Cough: Secondary | ICD-10-CM

## 2018-06-09 DIAGNOSIS — R059 Cough, unspecified: Secondary | ICD-10-CM

## 2018-06-09 DIAGNOSIS — K219 Gastro-esophageal reflux disease without esophagitis: Secondary | ICD-10-CM

## 2018-06-09 DIAGNOSIS — G479 Sleep disorder, unspecified: Secondary | ICD-10-CM | POA: Diagnosis not present

## 2018-06-09 MED ORDER — TRAZODONE HCL 50 MG PO TABS: 25.0000 mg | ORAL_TABLET | Freq: Every evening | ORAL | 3 refills | Status: DC | PRN

## 2018-06-09 MED ORDER — DOXYCYCLINE HYCLATE 100 MG PO TABS: 100.0000 mg | ORAL_TABLET | Freq: Two times a day (BID) | ORAL | 0 refills | Status: DC

## 2018-06-09 NOTE — Progress Notes (Signed)
Subjective:    [name] is a 53 y.o. female who presents to [facility] today for hospital FU:  1.  Hospital follow-up: Patient recently hospitalized for influenza and concern for influenza pneumonia.  She was seen here about 2 weeks ago and evidently had acute worsening about 1 day later with onset suddenly of myalgias and chills.  At that point she presented to the emergency department and tested positive for flu.  She was admitted and started on therapy with Tamiflu.  Since discharge she feels "much much better."  Still with cough that is worse at night.  It is starting to be productive with yellow-tinged sputum.  She was discharged home with a nebulizer machine but has not really needed to use this in the past few days.  Although she feels better her cough is now her main symptom.  However it is more mild than before.  2.  Insomnia: Off and on for the past several years.  She has mentioned this to me previously.  Now it is been coming more prevalent and consistent.  She can generally fall asleep well but then wakes up a few hours later and stays awake.  On some nights she does have trouble falling asleep as well.  She relates this mostly to stress and anxiety but still has trouble on nights when she does not feel anxious.  3.  GERD: -Persists.  She was switched to Nexium last visit as she was having some persistent symptoms.  However she now thinks that the [place] helped more than the Nexium.  She describes this mostly at night.  She describes a sensation of liquids sloshing back upwards into her throat when she lies down flat.  The indigestion pain has somewhat improved but the regurgitation has persisted.  No weight loss.  No abdominal pain.  No nausea or vomiting.  ROS as above per HPI.   The following portions of the patient's history were reviewed and updated as appropriate: allergies, current medications, past medical history, family and social history, and problem list. Patient is a  nonsmoker.    PMH reviewed.  Past Medical History:  Diagnosis Date  . Anemia   . Cough   . Diverticulitis with perforation 2010  . DVT (deep vein thrombosis) in pregnancy 11/24/2016  . DVT, lower extremity ([place])   . Endometrial cancer ([place])   . Family history of adverse reaction to anesthesia    pts daughter had difficulty awakening following anesthesia   . GERD (gastroesophageal reflux disease)   . Headache   . History of bronchitis   . History of ear infections   . Hx of migraines   . Hypertension   . IBS (irritable bowel syndrome)   . Kidney infection   . Lupus (systemic lupus erythematosus) ([place]) 02/2017  . Morbid obesity ([place])   . Ovarian cancer ([place]) dx'd 01/2015  . Pyelonephritis    Past Surgical History:  Procedure Laterality Date  . ABDOMINAL HYSTERECTOMY N/A 03/13/2015   Procedure: TOTAL HYSTERECTOMY ABDOMINAL BILATERAL SALPINGO OOPHORECTOMY RADICAL TUMOR [place];  Surgeon: Everitt Amber, MD;  Location: [facility];  Service: Gynecology;  Laterality: N/A;  . BOWEL RESECTION  03/13/2015   Procedure: SMALL BOWEL RESECTION;  Surgeon: Everitt Amber, MD;  Location: [facility];  Service: Gynecology;;  . [place] and 1984  . COLOSTOMY  2008  . COLOSTOMY TAKEDOWN    . LAPAROTOMY N/A 03/13/2015   Procedure: EXPLORATORY LAPAROTOMY;  Surgeon: Everitt Amber, MD;  Location: [facility];  Service: Gynecology;  Laterality: N/A;  . TONSILLECTOMY AND ADENOIDECTOMY  1997  . VENTRAL HERNIA REPAIR  03/13/2015   Procedure: HERNIA REPAIR VENTRAL ADULT;  Surgeon: Everitt Amber, MD;  Location: [facility];  Service: Gynecology;;    Medications reviewed. Current Outpatient Medications  Medication Sig Dispense Refill  . acetaminophen (TYLENOL) 500 MG tablet Take 1,000 mg by mouth every 6 (six) hours as needed for moderate pain.    Marland Kitchen albuterol (PROVENTIL HFA;VENTOLIN HFA) 108 (90 Base) MCG/ACT inhaler Inhale 2 puffs into the lungs every 6 (six) hours as needed for wheezing or shortness of breath. Please  instruct patient in usage. 1 Inhaler 0  . albuterol (PROVENTIL) (2.5 MG/3ML) 0.083% nebulizer solution Take 3 mLs (2.5 mg total) by nebulization every 4 (four) hours as needed for wheezing or shortness of breath. 75 mL 0  . cetirizine (ZYRTEC) 10 MG tablet Take 1 tablet (10 mg total) by mouth daily as needed for allergies (itching). 30 tablet 0  . dexlansoprazole (DEXILANT) 60 MG capsule Take 1 capsule (60 mg total) by mouth 2 (two) times daily. 60 capsule 3  . gabapentin (NEURONTIN) 100 MG capsule Take 100 mg by mouth at bedtime as needed (nerve pain).     Marland Kitchen oseltamivir (TAMIFLU) 75 MG capsule Take 1 capsule (75 mg total) by mouth 2 (two) times daily. 6 capsule 0  . oxybutynin (DITROPAN-XL) 5 MG 24 hr tablet Take 5 mg by mouth at bedtime.     . predniSONE (DELTASONE) 20 MG tablet Take 2 tablets (40 mg total) by mouth daily with breakfast. 6 tablet 0  . rivaroxaban (XARELTO) 20 MG TABS tablet Take 1 tablet (20 mg total) by mouth daily with supper. (Patient taking differently: Take 20 mg by mouth every morning. ) 14 tablet 0  . triamcinolone cream (KENALOG) 0.1 % APPLY TO THE AFFECTED AREA(S) TWICE DAILY AS NEEDED (Patient taking differently: Apply 1 application topically 2 (two) times daily as needed (skin irritation). APPLY TO THE AFFECTED AREA(S) TWICE DAILY AS NEEDED) 30 g 1   No current facility-administered medications for this visit.    Facility-Administered Medications Ordered in Other Visits  Medication Dose Route Frequency Provider Last Rate Last Dose  . sodium chloride flush (NS) 0.9 % injection 10 mL  10 mL Intravenous PRN Livesay, Lennis P, MD   10 mL at 05/31/15 0300  . sodium chloride flush (NS) 0.9 % injection 10 mL  10 mL Intravenous PRN Livesay, Lennis P, MD   10 mL at 08/02/15 1442     Objective:   Physical Exam BP 134/68   Pulse 81   Temp 98.5 F (36.9 C) (Oral)   Ht 5\' 5"  (1.651 m)   Wt (!) 393 lb 3.2 oz (178.4 kg)   LMP 10/26/2013   SpO2 98%   BMI 65.43 kg/m  Gen:   Alert, cooperative patient who appears stated age in no acute distress.  Vital signs reviewed. HEENT: EOMI,  MMM Cardiac:  Regular rate and rhythm without murmur auscultated.  Good S1/S2. Pulm:  Clear to auscultation bilaterally with good air movement.  No wheezes or rales noted.   Psych:  Not depressed or anxious appearing.  Linear and coherent thought process as evidenced by speech pattern. Smiles spontaneously.    No results found for this or any previous visit (from the past 72 hour(s)).

## 2018-06-09 NOTE — Patient Instructions (Signed)
It was great to see you again today.  I'm glad you're feeling better.  Take the Doxycycline once in the AM and once in the PM as an antibiotic.    Use the Trazodone at night to help you sleep.  Let's switch back to Dexilant.   Come back and see me in 2 weeks.  We'll do pulmonology referral at that point if you're still having a cough.  I also want you to see you GI once the Medicaid kicks in.

## 2018-06-11 ENCOUNTER — Encounter: Payer: Self-pay | Admitting: Family Medicine

## 2018-06-11 DIAGNOSIS — G479 Sleep disorder, unspecified: Secondary | ICD-10-CM

## 2018-06-11 DIAGNOSIS — R059 Cough, unspecified: Secondary | ICD-10-CM | POA: Insufficient documentation

## 2018-06-11 DIAGNOSIS — R05 Cough: Secondary | ICD-10-CM | POA: Insufficient documentation

## 2018-06-11 HISTORY — DX: Cough, unspecified: R05.9

## 2018-06-11 HISTORY — DX: Sleep disorder, unspecified: G47.9

## 2018-06-11 NOTE — Assessment & Plan Note (Signed)
Starting patient on trazodone today.  This shows up from her chart review back in 2016 but she does not ever taking this. -Hopefully this will help some with stress management/depression as well.

## 2018-06-11 NOTE — Assessment & Plan Note (Signed)
This is thankfully resolved.

## 2018-06-11 NOTE — Assessment & Plan Note (Signed)
Still with the reactive cough that is now producing yellow and green sputum.  She was previously diagnosed with bronchitis.  Recently hospitalized for pneumonia that was deemed viral secondary most likely to influenza. -She still has some cough.  She has had no fevers or chills.  However I am concerned about her overall immunocompromised state and therefore starting her on doxycycline today.  Follow-up in 2 weeks to assess for improvement.

## 2018-06-11 NOTE — Assessment & Plan Note (Signed)
Persists although better at least from a pain standpoint. -From her description it sounds like she might have a sliding hiatal hernia.  I reviewed her CT scans and for my brief review this might be indeed the case although was not mentioned in the report. -She requires a GI referral for these persistent symptoms.  However due to her lack of insurance she would like to wait on this. -She is in the process of obtaining Medicaid after recent hospitalization and we will wait for Medicaid to begin before referral based on patient preference.  If acutely worsening will refer as Medicaid will most likely be retroactive.

## 2018-06-14 ENCOUNTER — Ambulatory Visit (INDEPENDENT_AMBULATORY_CARE_PROVIDER_SITE_OTHER): Payer: Medicaid Other | Admitting: Family Medicine

## 2018-06-14 ENCOUNTER — Other Ambulatory Visit: Payer: Self-pay

## 2018-06-14 VITALS — BP 128/60 | HR 102 | Temp 99.8°F | Wt 385.0 lb

## 2018-06-14 DIAGNOSIS — R042 Hemoptysis: Secondary | ICD-10-CM | POA: Diagnosis not present

## 2018-06-14 DIAGNOSIS — R059 Cough, unspecified: Secondary | ICD-10-CM

## 2018-06-14 DIAGNOSIS — R05 Cough: Secondary | ICD-10-CM | POA: Diagnosis not present

## 2018-06-14 DIAGNOSIS — R112 Nausea with vomiting, unspecified: Secondary | ICD-10-CM | POA: Diagnosis not present

## 2018-06-14 MED ORDER — ONDANSETRON HCL 4 MG PO TABS: 4.0000 mg | ORAL_TABLET | Freq: Three times a day (TID) | ORAL | 0 refills | Status: DC | PRN

## 2018-06-14 MED ORDER — IPRATROPIUM BROMIDE 0.03 % NA SOLN: 2.0000 | Freq: Two times a day (BID) | NASAL | 12 refills | Status: DC

## 2018-06-14 NOTE — Assessment & Plan Note (Signed)
Ongoing hempotysis, although improved per patient. Likely due to cough in setting of chronic anticoagulation with Eliquis. Pt still endorsing stomach upset and vomiting and SOB. Possible that patient has GI upset from swallowing blood. SOB and fatigue could be due to anemia.  -Will check CBC.  -Tx Cough with ipratropium nasal spray -Zofran for nausea -F/u in 1 week if not improved.

## 2018-06-14 NOTE — Patient Instructions (Signed)
It was a pleasure to see you today! Thank you for choosing [facility] for your primary care. [name] was seen for coughing up blood.   1. For the coughing up blood, we are going to check your blood counts to make sure you are not loosing to much.  2. For the nausea and vomiting, you can take zofran as prescribed.  3. For the cough, you can also try the ipatroprium nasal spray.    Best,  Marny Lowenstein, MD, MS FAMILY MEDICINE RESIDENT - PGY2 06/14/2018 2:30 PM

## 2018-06-14 NOTE — Progress Notes (Signed)
Acute Office Visit  Subjective:    Patient ID: [name], female    DOB: 03-15-1966, 53 y.o.   MRN: [id number]  Chief Complaint  Patient presents with  . Cough    coughing up blood against, nausea and vomiting    Patient recenly diagnosed and hospitalized with influenza pneumonia. Seen in clinic for follow up by PCP started on doxycycline for presumed CAP. Patient is still with hemoptysis and fever up 100.20F orally last night. Now also having with emesis (nonbloody). Does have some nausea. Emesis is post-tussive. Still having CP (soreness w/ cough). Cough is improved.  Patient showed picture of sputum with bright red streaks. Actively coughing in room. Using her inhaler to improve cough.     Past Medical History:  Diagnosis Date  . Anemia   . Cough   . Diverticulitis with perforation 2010  . DVT (deep vein thrombosis) in pregnancy 11/24/2016  . DVT, lower extremity ([place])   . Endometrial cancer ([place])   . Family history of adverse reaction to anesthesia    pts daughter had difficulty awakening following anesthesia   . GERD (gastroesophageal reflux disease)   . Headache   . History of bronchitis   . History of ear infections   . Hx of migraines   . Hypertension   . IBS (irritable bowel syndrome)   . Kidney infection   . Lupus (systemic lupus erythematosus) ([place]) 02/2017  . Morbid obesity ([place])   . Ovarian cancer ([place]) dx'd 01/2015  . Pyelonephritis     Past Surgical History:  Procedure Laterality Date  . ABDOMINAL HYSTERECTOMY N/A 03/13/2015   Procedure: TOTAL HYSTERECTOMY ABDOMINAL BILATERAL SALPINGO OOPHORECTOMY RADICAL TUMOR [place];  Surgeon: Everitt Amber, MD;  Location: [facility];  Service: Gynecology;  Laterality: N/A;  . BOWEL RESECTION  03/13/2015   Procedure: SMALL BOWEL RESECTION;  Surgeon: Everitt Amber, MD;  Location: [facility];  Service: Gynecology;;  . [place] and 1984  . COLOSTOMY  2008  . COLOSTOMY TAKEDOWN    . LAPAROTOMY N/A 03/13/2015   Procedure: EXPLORATORY LAPAROTOMY;  Surgeon: Everitt Amber, MD;  Location: [facility];  Service: Gynecology;  Laterality: N/A;  . TONSILLECTOMY AND ADENOIDECTOMY  1997  . VENTRAL HERNIA REPAIR  03/13/2015   Procedure: HERNIA REPAIR VENTRAL ADULT;  Surgeon: Everitt Amber, MD;  Location: [facility];  Service: Gynecology;;    Family History  Problem Relation Age of Onset  . Diabetes Mother   . Hypertension Mother   . Hyperlipidemia Mother   . Colon polyps Mother        4 total  . Fibroids Mother        s/p hysterectomy  . Hyperlipidemia Father   . Diabetes Daughter   . Hodgkin's lymphoma Daughter 66  . Asthma Maternal Aunt        severe - d. 55  . Prostate cancer Maternal Uncle 28  . Diabetes Paternal Aunt   . Diabetes Paternal Uncle   . Kidney failure Maternal Grandmother 84  . Pancreatic cancer Paternal Grandmother        dx. >50  . Diabetes Paternal Grandfather   . Diabetes Paternal Aunt   . Pancreatic cancer Cousin 58       paternal 1st cousin  . Heart disease Neg Hx   . Stroke Neg Hx     Social History   Socioeconomic History  . Marital status: Legally Separated    Spouse name: Not on file  . Number of children:  Not on file  . Years of education: Not on file  . Highest education level: Not on file  Occupational History  . Not on file  Social Needs  . Financial resource strain: Not on file  . Food insecurity:    Worry: Not on file    Inability: Not on file  . Transportation needs:    Medical: Not on file    Non-medical: Not on file  Tobacco Use  . Smoking status: Never Smoker  . Smokeless tobacco: Never Used  Substance and Sexual Activity  . Alcohol use: Yes    Comment: Maybe wine every 6 months  . Drug use: No  . Sexual activity: Yes    Comment: Told she could not have more children in 1986  Lifestyle  . Physical activity:    Days per week: Not on file    Minutes per session: Not on file  . Stress: Not on file  Relationships  . Social connections:    Talks on  phone: Not on file    Gets together: Not on file    Attends religious service: Not on file    Active member of club or organization: Not on file    Attends meetings of clubs or organizations: Not on file    Relationship status: Not on file  . Intimate partner violence:    Fear of current or ex partner: Not on file    Emotionally abused: Not on file    Physically abused: Not on file    Forced sexual activity: Not on file  Other Topics Concern  . 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   Married x 23 years, recently separated (4/15).  No domestic violence.  + financial stress.     Lives previously with husband who moved out, now with daughter who has medical problems, including Hodgkin's disease, and granddaughter (age 39).     Uses seat belt.     Outpatient Medications Prior to Visit  Medication Sig Dispense Refill  . acetaminophen (TYLENOL) 500 MG tablet Take 1,000 mg by mouth every 6 (six) hours as needed for moderate pain.    Marland Kitchen albuterol (PROVENTIL HFA;VENTOLIN HFA) 108 (90 Base) MCG/ACT inhaler Inhale 2 puffs into the lungs every 6 (six) hours as needed for wheezing or shortness of breath. Please instruct patient in usage. 1 Inhaler 0  . albuterol (PROVENTIL) (2.5 MG/3ML) 0.083% nebulizer solution Take 3 mLs (2.5 mg total) by nebulization every 4 (four) hours as needed for wheezing or shortness of breath. 75 mL 0  . cetirizine (ZYRTEC) 10 MG tablet Take 1 tablet (10 mg total) by mouth daily as needed for allergies (itching). 30 tablet 0  . dexlansoprazole (DEXILANT) 60 MG capsule Take 1 capsule (60 mg total) by mouth 2 (two) times daily. 60 capsule 3  . doxycycline (VIBRA-TABS) 100 MG tablet Take 1 tablet (100 mg total) by mouth 2 (two) times daily. 20 tablet 0  . gabapentin (NEURONTIN) 100 MG capsule Take 100 mg by mouth at bedtime as needed (nerve pain).     Marland Kitchen  oseltamivir (TAMIFLU) 75 MG capsule Take 1 capsule (75 mg total) by mouth 2 (two) times daily. 6 capsule 0  . oxybutynin (DITROPAN-XL) 5 MG 24 hr tablet Take 5 mg by mouth at bedtime.     . predniSONE (DELTASONE) 20 MG tablet Take  2 tablets (40 mg total) by mouth daily with breakfast. 6 tablet 0  . rivaroxaban (XARELTO) 20 MG TABS tablet Take 1 tablet (20 mg total) by mouth daily with supper. (Patient taking differently: Take 20 mg by mouth every morning. ) 14 tablet 0  . traZODone (DESYREL) 50 MG tablet Take 0.5-1 tablets (25-50 mg total) by mouth at bedtime as needed for sleep. 30 tablet 3  . triamcinolone cream (KENALOG) 0.1 % APPLY TO THE AFFECTED AREA(S) TWICE DAILY AS NEEDED (Patient taking differently: Apply 1 application topically 2 (two) times daily as needed (skin irritation). APPLY TO THE AFFECTED AREA(S) TWICE DAILY AS NEEDED) 30 g 1   Facility-Administered Medications Prior to Visit  Medication Dose Route Frequency Provider Last Rate Last Dose  . sodium chloride flush (NS) 0.9 % injection 10 mL  10 mL Intravenous PRN Livesay, Lennis P, MD   10 mL at 05/31/15 0300  . sodium chloride flush (NS) 0.9 % injection 10 mL  10 mL Intravenous PRN Livesay, Lennis P, MD   10 mL at 08/02/15 1442    Allergies  Allergen Reactions  . Penicillins Rash    Has patient had a PCN reaction causing immediate rash, facial/tongue/throat swelling, SOB or lightheadedness with hypotension: no Has patient had a PCN reaction causing severe rash involving mucus membranes or skin necrosis: no Has patient had a PCN reaction that required hospitalization in the hospital at the time Has patient had a PCN reaction occurring within the last 10 years: no If all of the above answers are "NO", then may proceed with Cephalosporin use.     Review of Systems  Respiratory: Positive for cough, hemoptysis, sputum production and shortness of breath.   All other systems reviewed and are negative.      Objective:      Physical Exam  Constitutional: She appears well-developed. No distress.  HENT:  Head: Normocephalic and atraumatic.  Mouth/Throat: Oropharynx is clear and moist.  No observed sources of bleeding  Eyes: Right eye exhibits no discharge. Left eye exhibits no discharge.  Cardiovascular: Normal rate and regular rhythm. Exam reveals no friction rub.  No murmur heard. Pulmonary/Chest: Effort normal. No respiratory distress.  Actively coughing   Abdominal: Soft. Bowel sounds are normal. She exhibits no distension. There is no abdominal tenderness.  Musculoskeletal: Normal range of motion.        General: No tenderness.  Neurological: She is alert. She exhibits normal muscle tone.  Skin: Skin is warm and dry.  Psychiatric: She has a normal mood and affect. Her behavior is normal.    BP 128/60   Pulse (!) 102   Temp 99.8 F (37.7 C) (Oral)   Wt (!) 385 lb (174.6 kg)   LMP 10/26/2013   SpO2 98%   BMI 64.07 kg/m  Wt Readings from Last 3 Encounters:  06/14/18 (!) 385 lb (174.6 kg)  06/09/18 (!) 393 lb 3.2 oz (178.4 kg)  05/29/18 (!) 396 lb (179.6 kg)    Health Maintenance Due  Topic Date Due  . COLONOSCOPY  11/30/2014    There are no preventive care reminders to display for this patient.   Lab Results  Component Value Date   TSH 2.690 10/23/2017   Lab Results  Component Value Date   WBC 7.4 05/31/2018   HGB 10.3 (L) 05/31/2018   HCT 34.2 (L) 05/31/2018   MCV 96.3 05/31/2018   PLT 176 05/31/2018   Lab Results  Component Value Date   NA 139 05/31/2018  K 4.4 05/31/2018   CHLORIDE 110 (H) 01/17/2016   CO2 25 05/31/2018   GLUCOSE 169 (H) 05/31/2018   BUN 29 (H) 05/31/2018   CREATININE 1.30 (H) 05/31/2018   BILITOT 0.3 05/29/2018   ALKPHOS 83 05/29/2018   AST 15 05/29/2018   ALT 12 05/29/2018   PROT 6.3 (L) 05/29/2018   ALBUMIN 3.5 05/29/2018   CALCIUM 8.0 (L) 05/31/2018   ANIONGAP 6 05/31/2018   EGFR 70 (L) 01/17/2016   Lab Results  Component Value Date    CHOL 241 (H) 01/01/2018   Lab Results  Component Value Date   HDL 54 01/01/2018   Lab Results  Component Value Date   LDLCALC 168 (H) 01/01/2018   Lab Results  Component Value Date   TRIG 96 01/01/2018   Lab Results  Component Value Date   CHOLHDL 4.5 01/01/2018   Lab Results  Component Value Date   HGBA1C 5.7 (H) 01/01/2018       Assessment & Plan:   Problem List Items Addressed This Visit      Other   Hemoptysis - Primary    Ongoing hempotysis, although improved per patient. Likely due to cough in setting of chronic anticoagulation with Eliquis. Pt still endorsing stomach upset and vomiting and SOB. Possible that patient has GI upset from swallowing blood. SOB and fatigue could be due to anemia.  -Will check CBC.  -Tx Cough with ipratropium nasal spray -Zofran for nausea -F/u in 1 week if not improved.        Relevant Orders   CBC   Cough   Relevant Medications   ipratropium (ATROVENT) 0.03 % nasal spray    Other Visit Diagnoses    Non-intractable vomiting with nausea, unspecified vomiting type       Relevant Medications   ondansetron (ZOFRAN) 4 MG tablet       Meds ordered this encounter  Medications  . ondansetron (ZOFRAN) 4 MG tablet    Sig: Take 1 tablet (4 mg total) by mouth every 8 (eight) hours as needed for nausea or vomiting.    Dispense:  20 tablet    Refill:  0  . ipratropium (ATROVENT) 0.03 % nasal spray    Sig: Place 2 sprays into both nostrils every 12 (twelve) hours.    Dispense:  30 mL    Refill:  12     Bonnita Hollow, MD

## 2018-06-15 LAB — CBC
Hematocrit: 36.2 % (ref 34.0–46.6)
Hemoglobin: 11.8 g/dL (ref 11.1–15.9)
MCH: 29.1 pg (ref 26.6–33.0)
MCHC: 32.6 g/dL (ref 31.5–35.7)
MCV: 89 fL (ref 79–97)
Platelets: 203 10*3/uL (ref 150–450)
RBC: 4.06 x10E6/uL (ref 3.77–5.28)
RDW: 13.7 % (ref 11.7–15.4)
WBC: 13.7 10*3/uL — ABNORMAL HIGH (ref 3.4–10.8)

## 2018-06-16 ENCOUNTER — Encounter: Payer: Self-pay | Admitting: Family Medicine

## 2018-06-22 ENCOUNTER — Telehealth: Payer: Self-pay

## 2018-06-22 NOTE — Telephone Encounter (Signed)
Spoke with pt informed her of results. Pt understood. Advised if she had any questions just to give up a call.v.tll

## 2018-06-22 NOTE — Telephone Encounter (Signed)
Please call patient and inform that her hemoglobin (blood counts) were normal.

## 2018-06-22 NOTE — Telephone Encounter (Signed)
Patient calling for lab results.  Call back [phone]  Danley Danker, RN [facility] [place])

## 2018-06-23 ENCOUNTER — Encounter: Payer: Self-pay | Admitting: Licensed Clinical Social Worker

## 2018-06-23 ENCOUNTER — Telehealth: Payer: Self-pay | Admitting: Licensed Clinical Social Worker

## 2018-06-23 NOTE — Telephone Encounter (Addendum)
  06/23/2018  Type of Service: [facility] Start time: 4:00   End time: 4:30 Total time: 30 minutes  Name: [name] MRN: [id number] DOB: [birth year] [name] is a 53 y.o. female referred by self for concerns with stressors.  Patient returned call from LCSW. Talked with patient to assess needs and barriers.  Recent life changes: continued concerns with health, recent hospital admission.  Strengths: supportive family  Issues discussed: support system, community resource options, concerns with not having medical insurance and how managing Intervention: Client interviewed and appropriate assessments performed.  Spent time on engagement with patient.  Other intervention include: (Problem-solving teaching/coping strategies and Supportive Counseling and emotional support. Plan:Patient will FU with medicaid application and contact LCSW as need for supportive counseling with managing her  stressors.   [name], [facility]   [phone] 4:28 PM

## 2018-06-23 NOTE — Telephone Encounter (Signed)
   Outreach Note  06/23/2018 Name: [name] MRN: [id number] DOB: 1965/06/20  Referred by: Alveda Reasons, MD Reason for referral : Advice Only (Return phone call) An unsuccessful telephone outreach to patient was attempted returning her phone call.   Follow Up Plan: LCSW will wait for return call.  [name], [facility]   [phone] 3:38 PM

## 2018-06-29 MED FILL — XARELTO 20 MG TABLET: 20 | 30 days supply | Qty: 30 | Fill #5

## 2018-07-22 ENCOUNTER — Telehealth: Payer: Self-pay | Admitting: Licensed Clinical Social Worker

## 2018-07-22 NOTE — Telephone Encounter (Signed)
Care Coordination  Telephone Outreach Note 07/22/2018 Name: [name] MRN: [id number] DOB: 07/05/1965  LCSW called [name] F/U call reference managing stressors related to her health.  Concerns:  Patient reports difficult sleeping,  low and poor energy, not able to keep food down, becomes easily frustrated and increased crying. Patient states sleeping pills provided by PCP does not work anymore.  Lack of sleep is causing her to be more tired and frustrated.  ASSESSMENT: Patient is pleasant and engaged in conversation.  Very appreciative of F/U call from LCSW. Patient experiencing possible symptom of depression which maybe exacerbated by her medical concerns.    GOALS: 1. Reduce symptoms 2. Increase ability of: coping skills, healthy habits and self-management skills   Intervention:Client interviewed and appropriate assessments performed. Discussed support system,community resource ( IRS covid tax relief), HPOA and setting up MyChart. Provided client with information about tool kit for surviving during a crisis and copy of Advance Directives via e-mail.  Other interventions include;, Problem-solving teaching/coping strategies, Advocacy/Education, Psychoeducation and Consult MD as well as emotional support.  Plan:  1. bring the advance directives to the [facility] office to have it notarized the next time you come. 2. Patient will contact provider to share concerns  3. Patient will Look into the IRS information 4. Sign up for MyChart 5. Patient will F/U with LCSW as needed after she talks with provider.  Alveda Reasons, MD has been notified of this outreach and [name]'s plan.   [name], [facility]   [phone] 9:56 AM

## 2018-07-23 ENCOUNTER — Other Ambulatory Visit: Payer: Self-pay

## 2018-07-23 ENCOUNTER — Telehealth (INDEPENDENT_AMBULATORY_CARE_PROVIDER_SITE_OTHER)
Payer: No Typology Code available for payment source | Admitting: Student in an Organized Health Care Education/Training Program

## 2018-07-23 DIAGNOSIS — G479 Sleep disorder, unspecified: Secondary | ICD-10-CM

## 2018-07-23 DIAGNOSIS — K219 Gastro-esophageal reflux disease without esophagitis: Secondary | ICD-10-CM

## 2018-07-23 DIAGNOSIS — R112 Nausea with vomiting, unspecified: Secondary | ICD-10-CM

## 2018-07-23 MED ORDER — MELATONIN 1 MG PO TABS: 1.0000 mg | ORAL_TABLET | Freq: Every day | ORAL | 0 refills | Status: DC

## 2018-07-23 NOTE — Assessment & Plan Note (Signed)
Continue PPI Continue lifestyle modifications to reduce reflux Continue soft/liquid diet Refer to GI for possible GOO w/u

## 2018-07-23 NOTE — Assessment & Plan Note (Signed)
Patient states trazodone not helping Recommended continue trazodone Included prescription for melatonin

## 2018-07-23 NOTE — Progress Notes (Addendum)
[place] [facility] Visit  Patient consented to have virtual visit. Method of visit: Video was attempted, but technology challenges prevented patient from using video, so visit was conducted via telephone.  Encounter participants: Patient: [name] - located at home Provider: Richarda Osmond - located at home office Others (if applicable): none  Chief Complaint: chronic indigestion worsening and insomnia  HPI: Patient states that she has had indigestion since receiving chemotherapy 3-4 years ago and has been on protonix since then. The medication initially helped but has become less effective. She has modified lifestyle and does not lay down after meals for 4-5 hours but still experiences symptoms. Describes epigastric, non-radiating pain after eating for several hours. No stomach pain when not eating. Does not have difficulty or pain with swallowing. She has had increasing difficulty with swallowing solid foods, especially vegetables.  She feels the food get stuck in her ribs. She has begun only eating soft foods such as soup and applesauce. She sleeps in a reliner to prevent heart burn. She has also been vomiting every time she eats solid foods. She denies any blood or bile looking substance in her food. She states it looks like the food she just ate. Patient states that she has lost a few pounds. Upon chart review, she was down a few pounds at last appointment, but overall from the past year, she is actually up by 10-20 pounds and has a fluctuating weight.  She also complains of insomnia that is chronic in nature and has been getting worse despite being on trazodone for one month from PCP.  She has a chronic cough that wakes her up. She was seen by PCP for this last month with recent diagnosis of pneumonia and started on doxycycline. Patient endorses slight improvement and less sputum production.   ROS: per HPI  Pertinent PMHx: h/o cancer- started  on protonix when on chemo and had heart burn.  Exam:  Respiratory: no apparent SOB  Assessment/Plan:  GERD (gastroesophageal reflux disease) Continue PPI Continue lifestyle modifications to reduce reflux Continue soft/liquid diet Refer to GI for possible GOO w/u  Difficulty sleeping Patient states trazodone not helping Recommended continue trazodone Included prescription for melatonin    Time spent during visit with patient: 18 minutes

## 2018-07-27 ENCOUNTER — Encounter: Payer: Self-pay | Admitting: Gastroenterology

## 2018-07-27 ENCOUNTER — Other Ambulatory Visit: Payer: Self-pay

## 2018-07-27 ENCOUNTER — Ambulatory Visit (INDEPENDENT_AMBULATORY_CARE_PROVIDER_SITE_OTHER): Payer: Medicaid Other | Admitting: Gastroenterology

## 2018-07-27 ENCOUNTER — Telehealth: Payer: Self-pay

## 2018-07-27 DIAGNOSIS — K219 Gastro-esophageal reflux disease without esophagitis: Secondary | ICD-10-CM

## 2018-07-27 DIAGNOSIS — G8929 Other chronic pain: Secondary | ICD-10-CM

## 2018-07-27 DIAGNOSIS — R112 Nausea with vomiting, unspecified: Secondary | ICD-10-CM | POA: Diagnosis not present

## 2018-07-27 DIAGNOSIS — R1013 Epigastric pain: Secondary | ICD-10-CM | POA: Diagnosis not present

## 2018-07-27 DIAGNOSIS — R1310 Dysphagia, unspecified: Secondary | ICD-10-CM

## 2018-07-27 DIAGNOSIS — R1319 Other dysphagia: Secondary | ICD-10-CM

## 2018-07-27 NOTE — Addendum Note (Signed)
Addended by: Elias Else on: 07/27/2018 02:46 PM   Modules accepted: Orders

## 2018-07-27 NOTE — Telephone Encounter (Signed)
   Pt has been notified and aware. tt   You have been scheduled for an Upper GI Series [facility]. Your appointment is on 07-29-2018 at 930am. Please arrive 15 minutes prior to your test for registration. Make sure not to eat or drink anything after midnight on the night before your test. If you need to reschedule, please call radiology at [phone]. ________________________________________________________________ An upper GI series uses x rays to help diagnose problems of the upper GI tract, which includes the esophagus, stomach, and duodenum. The duodenum is the first part of the small intestine. An upper GI series is conducted by a radiology technologist or a radiologist-a doctor who specializes in x-ray imaging-at a hospital or outpatient center. While sitting or standing in front of an x-ray machine, the patient drinks barium liquid, which is often white and has a chalky consistency and taste. The barium liquid coats the lining of the upper GI tract and makes signs of disease show up more clearly on x rays. X-ray video, called fluoroscopy, is used to view the barium liquid moving through the esophagus, stomach, and duodenum. Additional x rays and fluoroscopy are performed while the patient lies on an x-ray table. To fully coat the upper GI tract with barium liquid, the technologist or radiologist may press on the abdomen or ask the patient to change position. Patients hold still in various positions, allowing the technologist or radiologist to take x rays of the upper GI tract at different angles. If a technologist conducts the upper GI series, a radiologist will later examine the images to look for problems.  This test typically takes about 1 hour to complete. __________________________________________________________________

## 2018-07-27 NOTE — Progress Notes (Signed)
This patient contacted our office requesting a physician telemedicine video consultation regarding clinical questions and/or test results.  If new patient, they were referred by Alison Searing, DO   Participants on the Zoom : myself and patient   The patient consented to phone consultation and was aware that a charge will be placed through their insurance.  I was in my office and the patient was at home   Encounter time:  Total time 45 minutes, with 31 minutes spent with patient on phone/webex    Alison Lund, MD   _____________________________________________________________________________________________              [facility] Consult Note:  History: [name] 07/27/2018  Referring physician: Alveda Reasons, MD  Reason for consult/chief complaint: Epigastric pain and reflux  Subjective  HPI:  This is a very pleasant 53 year old woman seen in video consult during [name] epidemic at the request of primary care for worsening reflux symptoms.  She had surgery and chemotherapy in 2016 for ovarian cancer.  It appears she had multistage surgery resection, temporary colostomy and eventual ostomy takedown.  She recalls that undergoing chemotherapy she developed dyspepsia with regurgitation and pyrosis.  That seemed to get under fairly good control with PPI.  It has bothered her on and off ever since then, but is worsening in the last several months.  She has frequent regurgitation both daytime and overnight.  She feels that food "just sits" and she has a difficult time digesting vegetables.  She feels as if that just sits in the stomach a long time and comes back up undigested.  She denies nausea but does have dysphagia.  Bowel habits are regular without rectal bleeding.  No prior colon cancer screening.  She has had arthralgias and was referred to rheumatology at [name] for possible scleroderma.  She saw them in December 2019, and their impression  was probable scleroderma based on blood work and physical exam findings of her hands.  She has not been on any particular medical therapy for that. [name] was also diagnosed with a DVT in 2018, remains on chronic [name].  ROS:  Review of Systems  No chest pain No dyspnea No dysuria She has lately been successful losing some weight through dieting and exercise, but not as much as she would have liked.  Past Medical History: Past Medical History:  Diagnosis Date  . Anemia   . Cough   . Diverticulitis with perforation 2010  . DVT (deep vein thrombosis) in pregnancy 11/24/2016  . DVT, lower extremity ([place])   . Endometrial cancer ([place])   . Family history of adverse reaction to anesthesia    pts daughter had difficulty awakening following anesthesia   . GERD (gastroesophageal reflux disease)   . Headache   . History of bronchitis   . History of ear infections   . Hx of migraines   . Hypertension   . IBS (irritable bowel syndrome)   . Kidney infection   . Lupus (systemic lupus erythematosus) ([place]) 02/2017  . Morbid obesity ([place])   . Ovarian cancer ([place]) dx'd 01/2015  . Pyelonephritis   . Scleroderma [facility])      Past Surgical History: Past Surgical History:  Procedure Laterality Date  . ABDOMINAL HYSTERECTOMY N/A 03/13/2015   Procedure: TOTAL HYSTERECTOMY ABDOMINAL BILATERAL SALPINGO OOPHORECTOMY RADICAL TUMOR [place];  Surgeon: Everitt Amber, MD;  Location: [facility];  Service: Gynecology;  Laterality: N/A;  . BOWEL RESECTION  03/13/2015   Procedure: SMALL BOWEL RESECTION;  Surgeon: Everitt Amber, MD;  Location: [facility];  Service: Gynecology;;  . [place] and 1984  . COLOSTOMY  2008  . COLOSTOMY TAKEDOWN    . LAPAROTOMY N/A 03/13/2015   Procedure: EXPLORATORY LAPAROTOMY;  Surgeon: Everitt Amber, MD;  Location: [facility];  Service: Gynecology;  Laterality: N/A;  . TONSILLECTOMY AND ADENOIDECTOMY  1997  . VENTRAL HERNIA REPAIR  03/13/2015   Procedure: HERNIA REPAIR VENTRAL ADULT;   Surgeon: Everitt Amber, MD;  Location: [facility];  Service: Gynecology;;     Family History: Family History  Problem Relation Age of Onset  . Diabetes Mother   . Hypertension Mother   . Hyperlipidemia Mother   . Colon polyps Mother        4 total  . Fibroids Mother        s/p hysterectomy  . Hyperlipidemia Father   . Diabetes Daughter   . Hodgkin's lymphoma Daughter 76  . Asthma Maternal Aunt        severe - d. 57  . Prostate cancer Maternal Uncle 66  . Diabetes Paternal Aunt   . Diabetes Paternal Uncle   . Kidney failure Maternal Grandmother 84  . Pancreatic cancer Paternal Grandmother        dx. >50  . Diabetes Paternal Grandfather   . Diabetes Paternal Aunt   . Heart disease Neg Hx   . Stroke Neg Hx   . Colon cancer Neg Hx   . Stomach cancer Neg Hx   . Esophageal cancer Neg Hx     Social History: Social History   Socioeconomic History  . Marital status: Legally Separated    Spouse name: Not on file  . Number of children: Not on file  . Years of education: Not on file  . Highest education level: Not on file  Occupational History  . Not on file  Social Needs  . Financial resource strain: Very hard  . Food insecurity:    Worry: Sometimes true    Inability: Sometimes true  . Transportation needs:    Medical: No    Non-medical: No  Tobacco Use  . Smoking status: Never Smoker  . Smokeless tobacco: Never Used  Substance and Sexual Activity  . Alcohol use: Yes    Comment: Maybe wine every 6 months  . Drug use: No  . Sexual activity: Yes    Partners: Male    Comment: Told she could not have more children in [place]  . Physical activity:    Days per week: Not on file    Minutes per session: Not on file  . Stress: Only a little  Relationships  . Social connections:    Talks on phone: Not on file    Gets together: Not on file    Attends religious service: Not on file    Active member of club or organization: Not on file    Attends meetings of clubs  or organizations: Not on file    Relationship status: Not on file  Other Topics Concern  . 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   Married x 23 years, recently separated (4/15).  No domestic violence.  + financial stress.     Lives previously with husband who moved out, now with daughter who has medical problems, including Hodgkin's disease, and granddaughter (age 40).  Uses seat belt.     Allergies: Allergies  Allergen Reactions  . Penicillins Rash    Has patient had a PCN reaction causing immediate rash, facial/tongue/throat swelling, SOB or lightheadedness with hypotension: no Has patient had a PCN reaction causing severe rash involving mucus membranes or skin necrosis: no Has patient had a PCN reaction that required hospitalization in the hospital at the time Has patient had a PCN reaction occurring within the last 10 years: no If all of the above answers are "NO", then may proceed with Cephalosporin use.     Outpatient Meds: Current Outpatient Medications  Medication Sig Dispense Refill  . acetaminophen (TYLENOL) 500 MG tablet Take 1,000 mg by mouth every 6 (six) hours as needed for moderate pain.    Marland Kitchen albuterol (PROVENTIL HFA;VENTOLIN HFA) 108 (90 Base) MCG/ACT inhaler Inhale 2 puffs into the lungs every 6 (six) hours as needed for wheezing or shortness of breath. Please instruct patient in usage. 1 Inhaler 0  . albuterol (PROVENTIL) (2.5 MG/3ML) 0.083% nebulizer solution Take 3 mLs (2.5 mg total) by nebulization every 4 (four) hours as needed for wheezing or shortness of breath. 75 mL 0  . cetirizine (ZYRTEC) 10 MG tablet Take 1 tablet (10 mg total) by mouth daily as needed for allergies (itching). 30 tablet 0  . dexlansoprazole (DEXILANT) 60 MG capsule Take 1 capsule (60 mg total) by mouth 2 (two) times daily. 60 capsule 3  . doxycycline (VIBRA-TABS)  100 MG tablet Take 1 tablet (100 mg total) by mouth 2 (two) times daily. 20 tablet 0  . gabapentin (NEURONTIN) 100 MG capsule Take 100 mg by mouth at bedtime as needed (nerve pain).     . ondansetron (ZOFRAN) 4 MG tablet Take 1 tablet (4 mg total) by mouth every 8 (eight) hours as needed for nausea or vomiting. 20 tablet 0  . oxybutynin (DITROPAN-XL) 5 MG 24 hr tablet Take 5 mg by mouth at bedtime.     . rivaroxaban (XARELTO) 20 MG TABS tablet Take 1 tablet (20 mg total) by mouth daily with supper. (Patient taking differently: Take 20 mg by mouth every morning. ) 14 tablet 0  . traZODone (DESYREL) 50 MG tablet Take 0.5-1 tablets (25-50 mg total) by mouth at bedtime as needed for sleep. 30 tablet 3  . triamcinolone cream (KENALOG) 0.1 % APPLY TO THE AFFECTED AREA(S) TWICE DAILY AS NEEDED (Patient taking differently: Apply 1 application topically 2 (two) times daily as needed (skin irritation). APPLY TO THE AFFECTED AREA(S) TWICE DAILY AS NEEDED) 30 g 1   No current facility-administered medications for this visit.    Facility-Administered Medications Ordered in Other Visits  Medication Dose Route Frequency Provider Last Rate Last Dose  . sodium chloride flush (NS) 0.9 % injection 10 mL  10 mL Intravenous PRN Livesay, Lennis P, MD   10 mL at 05/31/15 0300  . sodium chloride flush (NS) 0.9 % injection 10 mL  10 mL Intravenous PRN Livesay, Lennis P, MD   10 mL at 08/02/15 1442     Still on Xarelto - she does not know how long she will need to be on it ___________________________________________________________________ Objective   No exam - virtual visit  5', 5"   Last known weight 380  Labs:  CBC Latest Ref Rng & Units 06/14/2018 05/31/2018 05/30/2018  WBC 3.4 - 10.8 x10E3/uL 13.7(H) 7.4 6.8  Hemoglobin 11.1 - 15.9 g/dL 11.8 10.3(L) 10.2(L)  Hematocrit 34.0 - 46.6 % 36.2 34.2(L) 32.6(L)  Platelets 150 -  450 x10E3/uL 203 176 173   CMP Latest Ref Rng & Units 05/31/2018 05/30/2018 05/29/2018   Glucose 70 - 99 mg/dL 169(H) 154(H) 103(H)  BUN 6 - 20 mg/dL 29(H) 22(H) 21(H)  Creatinine 0.44 - 1.00 mg/dL 1.30(H) 1.05(H) 1.02(H)  Sodium 135 - 145 mmol/L 139 138 140  Potassium 3.5 - 5.1 mmol/L 4.4 4.3 3.7  Chloride 98 - 111 mmol/L 108 106 108  CO2 22 - 32 mmol/L 25 24 24   Calcium 8.9 - 10.3 mg/dL 8.0(L) 8.5(L) 8.5(L)  Total Protein 6.5 - 8.1 g/dL - - 6.3(L)  Total Bilirubin 0.3 - 1.2 mg/dL - - 0.3  Alkaline Phos 38 - 126 U/L - - 83  AST 15 - 41 U/L - - 15  ALT 0 - 44 U/L - - 12     Radiologic Studies:  CTA chest 05/2018:  B/L PNA, could not see pulmonary arteries well  Assessment: Encounter Diagnoses  Name Primary?  . Esophageal dysphagia Yes  . Gastroesophageal reflux disease, esophagitis presence not specified   . Abdominal pain, chronic, epigastric   . Nausea and vomiting in adult    Worsening upper digestive symptoms including early satiety, dysphagia, or reflux not much improved on PPI.  We discussed the limitation of acid suppression therapy and the need for dietary and lifestyle changes.  It sounds like weight loss would be helpful, but it has been difficult for her to achieve that.  I recommended smaller, more frequent meals, no food within 4 hours of bedtime, and elevating head of bed.  I am concerned about possible gastroparesis, gastric outlet obstruction, perhaps's scleroderma is affecting upper GI motility.  Plan:  Typically, would like to proceed to upper endoscopy in this case.  However, given the need for that to be done at hospital with her BMI and the current restrictions on that, as well as the patient's oral anticoagulation, we will proceed with radiographic work-up first.  My office will contact her to schedule an upper GI series.  Based on findings, we can then determine need for, and timing of, upper endoscopy.  I would then also communicate with primary care about her being off Xarelto 2 days prior to a scope.  It sounds like the PE was far back enough  that 2 days off [place] would be acceptable risk.  Thank you for the courtesy of this consult.  Please call me with any questions or concerns.  [name]  CC: Referring provider noted above

## 2018-07-27 NOTE — Addendum Note (Signed)
Addended by: Elias Else on: 07/27/2018 02:39 PM   Modules accepted: Orders

## 2018-07-29 ENCOUNTER — Ambulatory Visit (HOSPITAL_COMMUNITY)
Admission: RE | Admit: 2018-07-29 | Discharge: 2018-07-29 | Disposition: A | Discharge status: Home / Self Care | Payer: Medicaid Other | Source: Ambulatory Visit | Attending: Gastroenterology | Admitting: Gastroenterology

## 2018-07-29 ENCOUNTER — Ambulatory Visit (HOSPITAL_COMMUNITY): Payer: MEDICAID

## 2018-07-29 ENCOUNTER — Other Ambulatory Visit: Payer: Self-pay

## 2018-07-29 ENCOUNTER — Ambulatory Visit (HOSPITAL_COMMUNITY): Admission: RE | Admit: 2018-07-29 | Payer: MEDICAID | Source: Ambulatory Visit

## 2018-07-29 DIAGNOSIS — R112 Nausea with vomiting, unspecified: Secondary | ICD-10-CM

## 2018-07-29 DIAGNOSIS — R1319 Other dysphagia: Secondary | ICD-10-CM

## 2018-07-29 DIAGNOSIS — K219 Gastro-esophageal reflux disease without esophagitis: Secondary | ICD-10-CM

## 2018-07-29 DIAGNOSIS — G8929 Other chronic pain: Secondary | ICD-10-CM

## 2018-07-29 DIAGNOSIS — R1310 Dysphagia, unspecified: Secondary | ICD-10-CM | POA: Insufficient documentation

## 2018-07-29 DIAGNOSIS — R1013 Epigastric pain: Secondary | ICD-10-CM | POA: Diagnosis present

## 2018-07-30 ENCOUNTER — Telehealth: Payer: Self-pay | Admitting: *Deleted

## 2018-07-30 MED FILL — XARELTO 20 MG TABLET: 20 | 30 days supply | Qty: 30 | Fill #6

## 2018-07-30 NOTE — Telephone Encounter (Signed)
Pt is calling because the radiologist was going to reach out to Dr. Mingo Amber about her results.   Per pt, she was told by the radiologist that she had a hernia and we would likely give her medication at this time.  Will forward to MD. Christen Bame, CMA

## 2018-08-02 ENCOUNTER — Other Ambulatory Visit: Payer: Self-pay | Admitting: Family Medicine

## 2018-08-02 MED ORDER — ONDANSETRON 4 MG PO TBDP: 4.0000 mg | ORAL_TABLET | Freq: Three times a day (TID) | ORAL | 0 refills | Status: DC | PRN

## 2018-08-02 NOTE — Telephone Encounter (Signed)
Return patient's call.  Patient is asking about results from barium swallow ordered by gastroenterology.  She is scheduled to have nausea on a daily basis.  She has dry heaves and vomiting most nights.  She is sleeping in a chair/recliner rather bed to help with reflux symptoms.  She has stopped taking all of her reflux medications because "they were not helping."  I recommended she call her gastroenterologist to ask about follow-up appointment.  I have sent in short-term course of Zofran for some nausea at nighttime.  Also encouraged her to restart her Dexilant over the long-term treatment of hiatal hernia.  I assume next plan will likely be EGD per gastroenterology when one can be scheduled due to limitations imposed by coronavirus.

## 2018-08-03 ENCOUNTER — Telehealth: Payer: Self-pay | Admitting: Gastroenterology

## 2018-08-03 ENCOUNTER — Other Ambulatory Visit: Payer: Self-pay | Admitting: Family Medicine

## 2018-08-03 NOTE — Telephone Encounter (Signed)
(  The way epic formats these notes, certain sections are collapsed and must be opened to be viewed.  If you click on the Objective section, it will expand and you can see the remainder of the note along with exam, data and impression and plan.)  She has since gone for the upper GI series and I have just reviewed the report.  Not surprisingly, it shows a significant amount of reflux.  However, there is no stricture of the esophagus, no obstruction of the stomach or upper small intestine.  You can see from our note that we discussed limitation of acid suppression therapy as well as the measures that are necessary to help make a reflux symptoms better.  Elevating head of bed at night is essential, as is the most challenging issue of weight loss.  At this point, there does not appear need for an upper endoscopy.  I still have some concerned that she could have a delayed stomach emptying issue, perhaps related to her scleroderma.  Therefore, I would like her to have a gastric emptying study.  It is not urgent, and I suspect the radiology department will most likely put that off till June based on their current COVID related plans.

## 2018-08-03 NOTE — Telephone Encounter (Signed)
Patient called said that she would like to know what was going to be the next step after her phone visit on 4-21.

## 2018-08-03 NOTE — Telephone Encounter (Signed)
Dr. Loletha Carrow, Please advise on A&P from 07/27/18 visit.  I cannot see the plan.

## 2018-08-03 NOTE — Telephone Encounter (Signed)
Patient notified of the results and recommendations;. I offered to schedule GES for June, she prefers to wait on GES.  She wants to call back in June if she wants to schedule it.

## 2018-08-23 NOTE — Telephone Encounter (Signed)
Patient is ready to schedule GES

## 2018-09-02 ENCOUNTER — Other Ambulatory Visit: Payer: Self-pay | Admitting: Family Medicine

## 2018-09-03 MED FILL — XARELTO 20 MG TABLET: 20 | 90 days supply | Qty: 90 | Fill #0

## 2018-09-10 ENCOUNTER — Inpatient Hospital Stay: Payer: Medicaid Other | Admitting: Gynecologic Oncology

## 2018-09-10 ENCOUNTER — Telehealth: Payer: Self-pay | Admitting: *Deleted

## 2018-09-10 NOTE — Telephone Encounter (Signed)
Patient called and canceled her appt. Patient stated "they just rushed my son to the hospital via ambulance. He does have the COVID virus. I will call back later to reschedule."

## 2018-09-10 NOTE — Telephone Encounter (Signed)
Patient called back and schedule her appt for 6/22. The patient has had no contact with her son

## 2018-09-13 ENCOUNTER — Telehealth: Payer: Self-pay | Admitting: Family Medicine

## 2018-09-13 DIAGNOSIS — G8929 Other chronic pain: Secondary | ICD-10-CM

## 2018-09-13 DIAGNOSIS — M25512 Pain in left shoulder: Secondary | ICD-10-CM

## 2018-09-13 NOTE — Telephone Encounter (Signed)
Pt called regarding her wanting to see if she could receive an Cortizone shot in her left shoulder. Please give pt a call back when you can.

## 2018-09-13 NOTE — Telephone Encounter (Signed)
This would be fine if it has been at least 3 months since her last shot It appears she has been seen at sports medicine for these shots in the past We could do it here at the [facility] or she can follow up with sports medicine [facility] team, please contact patient & inquire which she would prefer; I am happy to place a referral if she'd like  Thanks Leeanne Rio, MD

## 2018-09-14 NOTE — Telephone Encounter (Signed)
Contacted pt and she said that she would like a new referral to sports medicine so that she can have her injection done.  Routing to PCP so she can place this.Baani Bober Zimmerman Rumple, CMA

## 2018-09-15 NOTE — Telephone Encounter (Signed)
Referral placed Alison Rio, MD

## 2018-09-23 ENCOUNTER — Encounter: Payer: Self-pay | Admitting: Sports Medicine

## 2018-09-23 ENCOUNTER — Ambulatory Visit: Payer: Medicaid Other | Admitting: Sports Medicine

## 2018-09-23 ENCOUNTER — Other Ambulatory Visit: Payer: Self-pay

## 2018-09-23 VITALS — BP 143/74 | Ht 65.5 in | Wt 376.0 lb

## 2018-09-23 DIAGNOSIS — M19019 Primary osteoarthritis, unspecified shoulder: Secondary | ICD-10-CM

## 2018-09-23 DIAGNOSIS — M25512 Pain in left shoulder: Secondary | ICD-10-CM | POA: Diagnosis not present

## 2018-09-23 DIAGNOSIS — G8929 Other chronic pain: Secondary | ICD-10-CM | POA: Diagnosis not present

## 2018-09-23 MED ORDER — METHYLPREDNISOLONE ACETATE 40 MG/ML IJ SUSP
40.0000 mg | Freq: Once | INTRAMUSCULAR | Status: AC
  Administered 2018-09-23: 20 mg via INTRA_ARTICULAR

## 2018-09-23 NOTE — Progress Notes (Signed)
  [name] - 53 y.o. female MRN [id number]  Date of birth: 1966/02/12    SUBJECTIVE:      Chief Complaint: Left shoulder pain  HPI:  53 year old female returns with recurrent left shoulder pain.  Her pain started about 2 months ago and has gradually increased, significantly worsening over the last 3 weeks.  She localizes her pain to the superior anterior aspect of the shoulder.  Is painful with reaching overhead or laying on her left side.  Her pain is sharp and can be 7/10.  Previously, she did have some rotator cuff tendinopathy and was doing physical therapy for this.  Now that her pain has returned and she is started doing the home exercises again.  She denies any distal numbness or tingling.  No skin changes   ROS:     See HPI. All other reviewed systems negative.  PERTINENT  PMH / PSH FH / / SH:  Past Medical, Surgical, Social, and Family History Reviewed & Updated in the EMR.  Pertinent findings include:  Patient recently diagnosed with SLE  OBJECTIVE: BP (!) 143/74   Ht 5' 5.5" (1.664 m)   Wt (!) 376 lb (170.6 kg)   LMP 10/26/2013   BMI 61.62 kg/m   Physical Exam:  Vital signs are reviewed.  GEN: Alert and oriented, NAD Pulm: Breathing unlabored PSY: normal mood, congruent affect  MSK: Left shoulder: No obvious deformity or asymmetry. No bruising. No swelling Patient significantly tender over the [facility] joint Slightly limited in flexion and abduction however this is symmetric.  Minimally painful at end range of motion NV intact distally Special Tests:  - Impingement: Mild discomfort with [place].  Negative Neers.  - Supraspinatus: Negative empty can.  5/5 strength - Infraspinatus/Teres: 5/5 strength with ER - Subscapularis: negative belly press, negative bear hug. 5/5 strength with IR - Biceps tendon: Negative Speeds.  - Labrum: Negative Obriens.  [facility] Joint: Negative cross arm - Negative apprehension test  Right shoulder: No obvious deformity or asymmetry  No focal tenderness Again, slight limited flexion and abduction symmetric with the left shoulder.  No pain on range of motion. 5/5 strength with RTC testing   ASSESSMENT & PLAN:  1.  Left shoulder pain-based on exam, her pain is due to [facility] joint arthropathy.  No concern at this time for RTC involvement/impingement.  Of note on ultrasound, there is a positive geyser sign -AC joint steroid injection performed today - Patient will follow-up in 4 weeks as needed.    Procedure performed: [facility] joint corticosteroid injection; [place] assisted Consent obtained and verified. Time-out conducted. Noted no overlying erythema, induration, or other signs of local infection. The  left [facility] Joint was identified with ultrasound. The over overlying skin was prepped prepped in a sterile fashion. Topical analgesic spray: Ethyl chloride. Joint: Left AC Joint Needle: 25GA, 1.5" Completed without difficulty. Meds: Depo-Medrol 20 mg, lidocaine 0.5 cc  Patient seen and evaluated with the sports medicine fellow.  I agree with the above plan of care.  Cortisone injection of the acromioclavicular joint performed as above.  Patient did notice some pain relief prior to leaving the office.  If symptoms return, she may need to consider distal clavicle excision.  Follow-up for ongoing or recalcitrant issues.

## 2018-09-27 ENCOUNTER — Other Ambulatory Visit: Payer: Self-pay

## 2018-09-27 ENCOUNTER — Inpatient Hospital Stay: Payer: Medicaid Other | Attending: Obstetrics and Gynecology

## 2018-09-27 ENCOUNTER — Encounter: Payer: Self-pay | Admitting: Gynecologic Oncology

## 2018-09-27 ENCOUNTER — Inpatient Hospital Stay (HOSPITAL_BASED_OUTPATIENT_CLINIC_OR_DEPARTMENT_OTHER): Payer: Medicaid Other | Admitting: Gynecologic Oncology

## 2018-09-27 VITALS — BP 156/85 | HR 86 | Temp 98.6°F | Resp 18 | Ht 65.5 in | Wt 387.6 lb

## 2018-09-27 DIAGNOSIS — Z90722 Acquired absence of ovaries, bilateral: Secondary | ICD-10-CM | POA: Diagnosis not present

## 2018-09-27 DIAGNOSIS — Z90710 Acquired absence of both cervix and uterus: Secondary | ICD-10-CM | POA: Diagnosis not present

## 2018-09-27 DIAGNOSIS — Z9221 Personal history of antineoplastic chemotherapy: Secondary | ICD-10-CM | POA: Insufficient documentation

## 2018-09-27 DIAGNOSIS — C569 Malignant neoplasm of unspecified ovary: Secondary | ICD-10-CM

## 2018-09-27 DIAGNOSIS — C561 Malignant neoplasm of right ovary: Secondary | ICD-10-CM | POA: Insufficient documentation

## 2018-09-27 DIAGNOSIS — C541 Malignant neoplasm of endometrium: Secondary | ICD-10-CM | POA: Diagnosis not present

## 2018-09-27 NOTE — Patient Instructions (Signed)
Dr Denman George will check your tumor marker today. Her office will call you after 24 hours with the result.  Please contact Dr Serita Grit office (at [phone]) in September, 2020 to request an appointment with her for December, 2020.  Please notify Dr Denman George at phone number [phone] if you notice vaginal bleeding, new pelvic or abdominal pains, bloating, feeling full easy, or a change in bladder or bowel function.

## 2018-09-27 NOTE — Progress Notes (Signed)
Followup of Consult: Gyn-Onc  CC:  Chief Complaint  Patient presents with  . Ovarian Cancer    follow-up  . endometrial cancer    follow-up    Assessment/Plan:  [name]  is a 53 y.o.  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  She is s/p exploratory laparotomy and TAH, BSO, optimal cytoreduction to no residual visible disease on 03/13/15. S/p 6 cycles of adjuvant carboplatin and paclitaxel chemotherapy completed 08/02/15. Genetic testing negative.  Hx of PE in May, 2017, with recurrent DVT and PE in October, 2018 - on Lovenox. Diagnosis with Lupus, awaiting therapy for this - has just established a rheumatologist and will start seeing them shortly.   Complete response to treatment. No evidence of recurrence.  CA 125 normal in December, 2019 - will check again today.   Given that her tumor marker (CA 125) was never elevated, will check CT scan in December, 2020.  HPI: [name] is a very pleasant 53 year old woman with history of 2 prior cesarean sections who is seen initially in the hospital as an inpatient consultation at [facility] on 02/19/2015 for complex pelvic masses. The patient is a morbidly obese woman with a BMI 62 kg/m. She was admitted to [facility] on 02/18/2015 with pyelonephritis. CT imaging and then MRI imaging was performed to evaluate for an etiology for pyelonephritis. This identified A large pelvic mass which it to be arising from the right adnexa. It measured 13 x 9 x 17.5 cm and was irregular cystic and solid in nature. A second, but cystic and solid lesion within the left adnexa measuring 4.4 x 6.5 x 5.3 cm. In the third cystic and solid mass was seen anterior to the uterus and superior to the bladder measuring 4.2 x 5.4 x 5.3 cm. She had no pelvic lymphadenopathy, no carcinomatosis, and CA 125 was normal at 21U/mL.  Her infection was adequately treated with IV antibiotics and her acute  kidney injury which manifested with an elevated creatinine 12.4 spontaneously resolved during her hospital stay with avoidance of NSAID and good hydration. She was noted to be anemic during her hospitalization with a hemoglobin of 10.8. However this did not require transfusion and spontaneously improved over time.   On 03/13/2015 she underwent an exploratory laparotomy TAH/BSO and radical tumor debulking for a stage IIIc carcinosarcoma of the right ovary and an incidentally found synchronous stage I a endometrial cancer. Tumor was found with a large right ovarian mass, smaller left ovarian mass and a carcinosarcoma implant was involving the anterior cul-de-sac between the bladder and lower uterine segment. All disease sites were completely resected with no macroscopic disease [name] at the completion of debulking.   She went on to receive 6 cycles of adjuvant chemotherapy with carboplatin and paclitaxel between 04/12/15 and 08/02/15. She tolerated treatment well with some neutropenia. CA 125 was normal at 4.1 at completion of therapy in March, 2017. On 08/23/15 she underwent post-treatment CT imaging which showed: there is a 2.5 x 1.6 cm soft tissue nodule near the vaginal cuff. Within the right hemipelvis there is a 1.8 cm soft tissue nodule.   Repeat CT on 11/21/15 showed that the small bilateral deep pelvic side wall soft tissue masses have decreased in size and are less suggestive of persistent/recurrent disease.  She has a stable small left ventral hernia.    CA 125 in October, 2017 normal.  CT abdo/pelvis in May, 2018 showed no evidence of  recurrent disease and CA 125 in May, 2018 was normal at 3.4.  In September, 2018 was normal at 3.  In October 2018 she was hospitalized with a left lower extremity DVT and PE. She was started on Lovenox. CT chest showed no lesions consistent with metastatic cancer.  CA 125 in December, 2018 normal at 3.   CA 125 in December, 2019 was normal at 3.5. CT  abd/pelvis on 03/12/18 showed no evidence of recurrent disease.   Interval History:  She was diagnosed with Lupus and had a delay in starting therapy due to lack of insurance/medicaide coverage. She has an appointment with [facility] in July, 2020.  She has no symptoms concerning for recurrence.   Today we discussed survivorship issues including weight and diet and exercise, cancer surveillance plans and sexual function.    Current Meds:  Outpatient Encounter Medications as of 09/27/2018  Medication Sig  . acetaminophen (TYLENOL) 500 MG tablet Take 1,000 mg by mouth every 6 (six) hours as needed for moderate pain.  Marland Kitchen albuterol (PROVENTIL HFA;VENTOLIN HFA) 108 (90 Base) MCG/ACT inhaler Inhale 2 puffs into the lungs every 6 (six) hours as needed for wheezing or shortness of breath. Please instruct patient in usage.  Marland Kitchen albuterol (PROVENTIL) (2.5 MG/3ML) 0.083% nebulizer solution Take 3 mLs (2.5 mg total) by nebulization every 4 (four) hours as needed for wheezing or shortness of breath.  . cetirizine (ZYRTEC) 10 MG tablet Take 1 tablet (10 mg total) by mouth daily as needed for allergies (itching).  Marland Kitchen dexlansoprazole (DEXILANT) 60 MG capsule Take 1 capsule (60 mg total) by mouth 2 (two) times daily.  Marland Kitchen doxycycline (VIBRA-TABS) 100 MG tablet Take 1 tablet (100 mg total) by mouth 2 (two) times daily.  Marland Kitchen gabapentin (NEURONTIN) 100 MG capsule Take 100 mg by mouth at bedtime as needed (nerve pain).   . ondansetron (ZOFRAN ODT) 4 MG disintegrating tablet Take 1 tablet (4 mg total) by mouth every 8 (eight) hours as needed for nausea or vomiting.  . ondansetron (ZOFRAN) 4 MG tablet Take 1 tablet (4 mg total) by mouth every 8 (eight) hours as needed for nausea or vomiting.  Marland Kitchen oxybutynin (DITROPAN-XL) 5 MG 24 hr tablet Take 5 mg by mouth at bedtime.   . rivaroxaban (XARELTO) 20 MG TABS tablet Take 1 tablet (20 mg total) by mouth every morning.  . TOVIAZ 8 MG TB24 tablet TAKE 1 TABLET (8MG  TOTAL)  BY MOUTH DAILY.  . traZODone (DESYREL) 50 MG tablet Take 0.5-1 tablets (25-50 mg total) by mouth at bedtime as needed for sleep.  Marland Kitchen triamcinolone cream (KENALOG) 0.1 % APPLY TO THE AFFECTED AREA(S) TWICE DAILY AS NEEDED (Patient taking differently: Apply 1 application topically 2 (two) times daily as needed (skin irritation). APPLY TO THE AFFECTED AREA(S) TWICE DAILY AS NEEDED)   Facility-Administered Encounter Medications as of 09/27/2018  Medication  . sodium chloride flush (NS) 0.9 % injection 10 mL  . sodium chloride flush (NS) 0.9 % injection 10 mL    Allergy:  Allergies  Allergen Reactions  . Penicillins Rash    Has patient had a PCN reaction causing immediate rash, facial/tongue/throat swelling, SOB or lightheadedness with hypotension: no Has patient had a PCN reaction causing severe rash involving mucus membranes or skin necrosis: no Has patient had a PCN reaction that required hospitalization in the hospital at the time Has patient had a PCN reaction occurring within the last 10 years: no If all of the above answers are "NO", then may  proceed with Cephalosporin use.     Social Hx:   Social History   Socioeconomic History  . Marital status: Legally Separated    Spouse name: Not on file  . Number of children: Not on file  . Years of education: Not on file  . Highest education level: Not on file  Occupational History  . Not on file  Social Needs  . Financial resource strain: Very hard  . Food insecurity    Worry: Sometimes true    Inability: Sometimes true  . Transportation needs    Medical: No    Non-medical: No  Tobacco Use  . Smoking status: Never Smoker  . Smokeless tobacco: Never Used  Substance and Sexual Activity  . Alcohol use: Yes    Comment: Maybe wine every 6 months  . Drug use: No  . Sexual activity: Yes    Partners: Male    Comment: Told she could not have more children in [place]  . Physical activity    Days per week: Not on file     Minutes per session: Not on file  . Stress: Only a little  Relationships  . Social Herbalist on phone: Not on file    Gets together: Not on file    Attends religious service: Not on file    Active member of club or organization: Not on file    Attends meetings of clubs or organizations: Not on file    Relationship status: Not on file  . Intimate partner violence    Fear of current or ex partner: Not on file    Emotionally abused: Not on file    Physically abused: Not on file    Forced sexual activity: Not on file  Other Topics Concern  . 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   Married x 23 years, recently separated (4/15).  No domestic violence.  + financial stress.     Lives previously with husband who moved out, now with daughter who has medical problems, including Hodgkin's disease, and granddaughter (age 79).     Uses seat belt.     Past Surgical Hx:  Past Surgical History:  Procedure Laterality Date  . ABDOMINAL HYSTERECTOMY N/A 03/13/2015   Procedure: TOTAL HYSTERECTOMY ABDOMINAL BILATERAL SALPINGO OOPHORECTOMY RADICAL TUMOR [place];  Surgeon: Everitt Amber, MD;  Location: [facility];  Service: Gynecology;  Laterality: N/A;  . BOWEL RESECTION  03/13/2015   Procedure: SMALL BOWEL RESECTION;  Surgeon: Everitt Amber, MD;  Location: [facility];  Service: Gynecology;;  . [place] and 1984  . COLOSTOMY  2008  . COLOSTOMY TAKEDOWN    . LAPAROTOMY N/A 03/13/2015   Procedure: EXPLORATORY LAPAROTOMY;  Surgeon: Everitt Amber, MD;  Location: [facility];  Service: Gynecology;  Laterality: N/A;  . TONSILLECTOMY AND ADENOIDECTOMY  1997  . VENTRAL HERNIA REPAIR  03/13/2015   Procedure: HERNIA REPAIR VENTRAL ADULT;  Surgeon: Everitt Amber, MD;  Location: [facility];  Service: Gynecology;;    Past Medical Hx:  Past Medical History:  Diagnosis Date  . Anemia   . Cough    . Diverticulitis with perforation 2010  . DVT (deep vein thrombosis) in pregnancy 11/24/2016  . DVT, lower extremity ([place])   . Endometrial cancer ([place])   . Family history of adverse reaction to anesthesia  pts daughter had difficulty awakening following anesthesia   . GERD (gastroesophageal reflux disease)   . Headache   . History of bronchitis   . History of ear infections   . Hx of migraines   . Hypertension   . IBS (irritable bowel syndrome)   . Kidney infection   . Lupus (systemic lupus erythematosus) ([place]) 02/2017  . Morbid obesity ([place])   . Ovarian cancer ([place]) dx'd 01/2015  . Pyelonephritis   . Scleroderma [facility])     Past Gynecological History:  C/s x 2  Patient's last menstrual period was 10/26/2013.  Family Hx:  Family History  Problem Relation Age of Onset  . Diabetes Mother   . Hypertension Mother   . Hyperlipidemia Mother   . Colon polyps Mother        4 total  . Fibroids Mother        s/p hysterectomy  . Hyperlipidemia Father   . Diabetes Daughter   . Hodgkin's lymphoma Daughter 84  . Asthma Maternal Aunt        severe - d. 44  . Prostate cancer Maternal Uncle 9  . Diabetes Paternal Aunt   . Diabetes Paternal Uncle   . Kidney failure Maternal Grandmother 84  . Pancreatic cancer Paternal Grandmother        dx. >50  . Diabetes Paternal Grandfather   . Diabetes Paternal Aunt   . Heart disease Neg Hx   . Stroke Neg Hx   . Colon cancer Neg Hx   . Stomach cancer Neg Hx   . Esophageal cancer Neg Hx     Review of Systems:  Constitutional  Feels well,    ENT Normal appearing ears and nares bilaterally Skin/Breast  + lupus rash on face Cardiovascular  No chest pain, shortness of breath, or edema  Pulmonary  No cough or wheeze.  Gastro Intestinal  No nausea, vomitting, or diarrhoea. No bright red blood per rectum, nochange in bowel movement, or constipation.  Genito Urinary  No frequency, urgency, dysuria,  Musculo Skeletal  + bilateral knee  pain with ambulation, + shoulder pain Neurologic  No weakness, numbness, change in gait,  Psychology  No depression, anxiety, insomnia.   Vitals:  Blood pressure (!) 156/85, pulse 86, temperature 98.6 F (37 C), temperature source Oral, resp. rate 18, height 5' 5.5" (1.664 m), weight (!) 387 lb 9.6 oz (175.8 kg), last menstrual period 10/26/2013, SpO2 98 %.  Physical Exam: WD in NAD Neck  Supple NROM, without any enlargements.  Lymph Node Survey No cervical supraclavicular or inguinal adenopathy Cardiovascular  Pulse normal rate, regularity and rhythm. S1 and S2 normal.  Lungs  Clear to auscultation bilateraly, without wheezes/crackles/rhonchi. Good air movement.  Skin  + facial rash consistent with lupus Psychiatry  Alert and oriented to person, place, and time  Abdomen  Normoactive bowel sounds, abdomen soft, non-tender and obese without palpable evidence of hernia. There is a large midline incision which is healed. Back No CVA tenderness Genito Urinary  Vulva/vagina: Normal external female genitalia.  No lesions. No discharge or bleeding.  Bladder/urethra:  No lesions or masses, well supported bladder  Vagina: cuff in tact.  Cervix and uterus: surgically absent  Adnexa: no palpable masses. Rectal  Good tone, no masses no cul de sac nodularity.  Extremities  No bilateral cyanosis, clubbing or edema.   Thereasa Solo, MD  09/27/2018, 3:59 PM

## 2018-09-28 ENCOUNTER — Ambulatory Visit: Payer: Medicaid Other | Admitting: Family Medicine

## 2018-09-28 ENCOUNTER — Other Ambulatory Visit: Payer: Self-pay

## 2018-09-28 VITALS — BP 135/80 | HR 76

## 2018-09-28 DIAGNOSIS — G479 Sleep disorder, unspecified: Secondary | ICD-10-CM

## 2018-09-28 DIAGNOSIS — M349 Systemic sclerosis, unspecified: Secondary | ICD-10-CM | POA: Diagnosis present

## 2018-09-28 LAB — CA 125: Cancer Antigen (CA) 125: 3.7 U/mL (ref 0.0–38.1)

## 2018-09-28 MED ORDER — ALBUTEROL SULFATE HFA 108 (90 BASE) MCG/ACT IN AERS: 2.0000 | INHALATION_SPRAY | Freq: Four times a day (QID) | RESPIRATORY_TRACT | 3 refills | Status: DC | PRN

## 2018-09-28 NOTE — Progress Notes (Signed)
Date of Visit: 09/28/2018   HPI:  Patient presents to meet her new PCP, as her prior PCP Dr. Mingo Amber recently left our practice.  She has no acute concerns today other than requesting a referral to see a rheumatologist. Reports she was diagnosed with lupus several years ago and due to insurance issues has not seen a rheumatologist in a while. Needs new referral to be seen. Wants to see [facility] Rheum  Also needs refill of albuterol inhaler. Uses it about 2-3 times a month total. Breathing overall well.   Has trouble staying asleep. Has never had sleep study. Is sleepy during daytime. Morbidly obese. Does snore.  ROS: See HPI.  [place]: history of hypertension, R ovarian cancer, CKD3, morbid obesity, ventral hernia, endometrial cancer, GERD, prior CVA, on lifelong anticoagulation, scleroderma  PHYSICAL EXAM: BP 135/80   Pulse 76   LMP 10/26/2013   SpO2 97%  Gen: no acute distress, pleasant, cooperative , well appearing HEENT: normocephalic, atraumatic  Lungs: normal work of breathing  Neuro: alert grossly nonfocal, speech normal Ext: No appreciable lower extremity edema bilaterally   ASSESSMENT/PLAN:  Request for referral Dx of lupus vs scleroderma unclear - I don't see any records where patient was definitively diagnosed with SLE. She would like to see rheum again. Will place referral.  Refill albuterol for as needed use   Morbid obesity ([place]) Motivated to lose weight Refer to [facility] - gave patient contact info  Difficulty sleeping Would benefit from sleep study given fatigue, trouble sleeping, morbid obesity, and snoring. Order placed.  FOLLOW UP: Follow up in 3 months with me for routine care Referring to rheum  [place] J. [name], [place]

## 2018-09-28 NOTE — Patient Instructions (Signed)
  Call the [facility] to set up an appointment for weight management. Their number is [phone].   Ordering sleep study for you  Refilled albuterol  Will refer to [place] [facility], Dr. [name]

## 2018-09-29 ENCOUNTER — Telehealth: Payer: Self-pay

## 2018-09-29 NOTE — Telephone Encounter (Signed)
Told [name] that her CA-125 was WNL at 3.7.  Keep return appointments as scheduled.

## 2018-09-30 NOTE — Assessment & Plan Note (Signed)
Would benefit from sleep study given fatigue, trouble sleeping, morbid obesity, and snoring. Order placed.

## 2018-09-30 NOTE — Assessment & Plan Note (Signed)
Motivated to lose weight Refer to [facility] [place] - gave patient contact info

## 2018-10-15 ENCOUNTER — Telehealth: Payer: Self-pay

## 2018-10-15 NOTE — Telephone Encounter (Signed)
Patient calls nurse line stating she has an apt with [facility], however it is not until April 2021. Patient would like to be referred somewhere else, that can accommodate her sooner.   Will route to our referral coordinator.

## 2018-10-18 ENCOUNTER — Other Ambulatory Visit (HOSPITAL_COMMUNITY): Payer: Medicaid Other

## 2018-10-19 ENCOUNTER — Other Ambulatory Visit: Payer: Self-pay

## 2018-10-19 ENCOUNTER — Ambulatory Visit (INDEPENDENT_AMBULATORY_CARE_PROVIDER_SITE_OTHER): Payer: Medicaid Other | Admitting: Licensed Clinical Social Worker

## 2018-10-19 DIAGNOSIS — F439 Reaction to severe stress, unspecified: Secondary | ICD-10-CM

## 2018-10-19 NOTE — BH Specialist Note (Signed)
   Care Coordination  Telephone Outreach Note  10/19/2018 Name: [name]      MRN: [id number]          DOB: 09/27/1965 Start time: 11:00   End time: 11:30 Total time: 30 minutes Referred by: herself Reason for referral : Care Coordination and brief interventions. Confirmed patient's address: Yes    Confirmed patient's name  Yes   I reached out to [name] today, she scheduled a phone visit to talk with LCSW. Report of concerns: stress with navigating medical providers.  Patient reports continued concerns with not being able to get in to see a rheumatologist and other specialist to address ongoing medical concerns that have progressed over the past several months.  Duration of problem/ how impacting : started several years ago and continues to progress.  Medical concerns are impacting her ability to eat solid foods and digest them.  She is only able to eat soft foods which end up being starch ( grits, oatmeal, potatoes, rice) this is not what she wants to eat as this is not healthy but is the only thing that she can eat) Strengths: strong advocate for self and not giving up on getting the care she needs  ASSESSMENT: [name] is a 53 y.o. year old female who sees [name], Alison Limber, MD for primary care. LCSW called patient to assess needs and barriers. Patient is pleasant and engaged in conversation.  She is frustrated and needed assist with managing her stress. Patient's previous barriers to accessing medical care and specialty providers was due to not having medical insurance. She was approved for Regular Medicaid effective June 1st. ( copy of card under media section ). Disability assistance is pending.  GOALS:  1. Reduce: stress related to health concerns 2.   Continue to advocate for self Issues discussed: support system, community resources, patient recently approved for Medicaid, disability is pending.  INTERVENTION: Client interviewed and appropriate  assessments performed. Review of patient consultants notes from with appropriate care team members was performed as part of provision for care coordination referral. Provided patient with information about medicaid Transportation to all medical appointments ; also utilized engagement as part of my intervention.  Other interventions include:  Motivational Interviewing,  Advocacy/Education . SDOH (Social Determinants of Health) screening also performed today.  challenges identified: None  PLAN::  1. Patient will continue to advocate for herself 2. Patient will call LCSW as needed 3. LCSW will inform referral coordinator that patient no longer has the [facility] card but has Regular Medicaid. 4.   LCSW will also provide PCP with an update.  Alison Rio, MD has been notified of this outreach and [name]'s decision and plan. As well as continued request from patient for referral to a Rheumatologist that can see her prior to April of 2021 and a call from PCP.  [name], [facility]   [phone]

## 2018-10-20 ENCOUNTER — Encounter (HOSPITAL_BASED_OUTPATIENT_CLINIC_OR_DEPARTMENT_OTHER): Payer: Medicaid Other

## 2018-10-22 ENCOUNTER — Telehealth: Payer: Self-pay | Admitting: Family Medicine

## 2018-10-22 DIAGNOSIS — M349 Systemic sclerosis, unspecified: Secondary | ICD-10-CM

## 2018-10-22 NOTE — Telephone Encounter (Signed)
Called patient as requested during her visit with [name] CSW.  Patient now has medicaid and needs new referral to rheum put in. Would like to go to [facility]. Will place order for referral.  Patient also notes she continues to have issues with food coming right back up after eating it. Vomiting daily. Said she called her GI doctor's office to schedule GES as previously instructed and she says the nurse told her she no longer needed that test. Advised she call again; per Dr. Loletha Carrow' last phone note he did want her to have the [place].  Patient appreciative Leeanne Rio, MD

## 2018-10-25 ENCOUNTER — Telehealth: Payer: Self-pay | Admitting: Gastroenterology

## 2018-10-25 ENCOUNTER — Other Ambulatory Visit: Payer: Self-pay | Admitting: *Deleted

## 2018-10-25 DIAGNOSIS — R112 Nausea with vomiting, unspecified: Secondary | ICD-10-CM

## 2018-10-25 NOTE — Progress Notes (Signed)
[name]

## 2018-10-25 NOTE — Telephone Encounter (Signed)
Spoke to the patient who reports worsened vomiting after PO intake (food and liquid). The patient states that she cannot tolerate vegetables "ruffage" or meat, these foods are the quickest she vomits. The patient states that she can eat in the morning and will vomit up to 10-14 hours later the entire contents of the undigested food in her stomach that she ate that morning. The patient reported that there are several triggers to her vomiting: coughing, belching, or laying flat. She does not consume any food after 5 pm but also sleeps with a 24 oz water bottle because the urge to vomit quickly overtakes her and she does not have time to make it to the bathroom. She often fills the entire bottle prior to waking in the morning. The patient is not currently taking any antiemetics, she sleeps the the [facility] elevated or in a recliner. The patient mentioned that she would like to have a GES. Please advise.

## 2018-10-25 NOTE — Telephone Encounter (Signed)
Spoke to patient, patient notified of GES scheduled at [facility] on 11/03/2018 at 9:30 am with an arrival time of 9:00 am. Patient told to remain NPO after midnight, meds included, specifically PPI's. Patient verbalized understanding. Nothing further at the time of the call.

## 2018-10-25 NOTE — Telephone Encounter (Signed)
Pt states that she is still dealing with sxs, she wants to know if she can schedule gastric emptying study. Pls call her.

## 2018-10-25 NOTE — Telephone Encounter (Signed)
Yes, please arrange a gastric emptying study.  I would like it done in the next couple of weeks if possible.  That way, if findings indicate need for upper endoscopy, we could try to get it on next hospital endoscopy date.

## 2018-10-26 NOTE — Telephone Encounter (Signed)
  10/26/2018 Name: [name] MRN: [id number] DOB: Nov 17, 1965  Referred by: Leeanne Rio, MD  LCSW received voice message from patient.  She is very thanking for assistance of care coordination.   Patient has an appointment with Rheumatologist on July 27th and appointment with GI specialist on July 29th  Update provided to PCP   [name], [place]   [phone] 1:59 PM

## 2018-11-01 ENCOUNTER — Telehealth: Payer: Self-pay | Admitting: Gastroenterology

## 2018-11-01 NOTE — Telephone Encounter (Signed)
Spoke to the patient, explained what the GES was and spoke about visitor rules. Patient verbalized understanding. Nothing further at the time of the call.

## 2018-11-01 NOTE — Telephone Encounter (Signed)
Pt would like details regarding her gastric emptying.

## 2018-11-03 ENCOUNTER — Telehealth: Payer: Self-pay | Admitting: Gastroenterology

## 2018-11-03 ENCOUNTER — Encounter (HOSPITAL_COMMUNITY): Payer: Medicaid Other

## 2018-11-03 NOTE — Telephone Encounter (Signed)
Patient missed her appt for today for a gastric emptying she needs to reschedule.

## 2018-11-03 NOTE — Telephone Encounter (Signed)
Patient was given the number to call and reschedule

## 2018-11-09 ENCOUNTER — Encounter: Payer: Self-pay | Admitting: Sports Medicine

## 2018-11-09 ENCOUNTER — Ambulatory Visit (INDEPENDENT_AMBULATORY_CARE_PROVIDER_SITE_OTHER): Payer: Medicaid Other | Admitting: Sports Medicine

## 2018-11-09 ENCOUNTER — Other Ambulatory Visit: Payer: Self-pay

## 2018-11-09 VITALS — BP 135/74 | Ht 65.5 in | Wt 380.0 lb

## 2018-11-09 DIAGNOSIS — M5412 Radiculopathy, cervical region: Secondary | ICD-10-CM | POA: Diagnosis not present

## 2018-11-09 DIAGNOSIS — M542 Cervicalgia: Secondary | ICD-10-CM | POA: Diagnosis present

## 2018-11-09 MED ORDER — METHOCARBAMOL 750 MG PO TABS: 750.0000 mg | ORAL_TABLET | Freq: Three times a day (TID) | ORAL | 0 refills | Status: DC | PRN

## 2018-11-09 NOTE — Progress Notes (Signed)
SUBJECTIVE:      Chief Complaint:  Right neck and arm pain   HPI:  [name] is a 53 year old right-handed female presenting today for 3 weeks of right upper arm and neck pain.  The pain started as intermittent, but has progressed to daily discomfort.  It starts in her right upper back and radiates down to the mid forearm, she describes it as an aching and pulling sensation.  Alleviating factors include muscle rub cream and Tylenol.  Aggravating factors include sleeping on that right side.  She was recently diagnosed with lupus and has a follow-up up appointment with a rheumatologist at the end of the month.  She denies weakness, swelling, bruising, skin changes.   ROS:     See HPI. All other reviewed systems negative.   PERTINENT  PMH / PSH / FH / SH:  Past Medical, Surgical, Social, and Family History Reviewed & Updated in the EMR.  Pertinent findings include:  Lupus, NSAIDs are contraindicated due to being on blood thinner   OBJECTIVE:  BP 135/74   Ht 5' 5.5" (1.664 m)   Wt (!) 380 lb (172.4 kg)   LMP 10/26/2013   BMI 62.27 kg/m  Physical Exam:  Vital signs are reviewed.   GEN: Alert and oriented, NAD, obese, right-handed Pulm: Breathing unlabored PSY: normal mood, congruent affect  MSK: Neck No gross deformity, swelling, bruising. No midline/bony TTP. Spasm in upper trap, rhomboids and along medial scapula Pain in active right rotation of the neck that improves passively BUE strength 5/5 in biceps, RUE triceps 4/5 Sensation intact to light touch.   2+ equal reflexes in triceps, biceps, brachioradialis tendons. Positive spurlings. NV intact distal BUEs.   ASSESSMENT & PLAN:   1. Neck pain Trial of methocarbamol at night, and can progress to during the day if does not become too drowsy.  We will order physical therapy for cervicalalgia with radiculopathy.  Patient will follow-up in 3 weeks at which time we can consider imaging if no improvement of  symptoms.   Lanier Clam, DO, ATC Sports Medicine Fellow  Note was dictated utilizing Musician. Please excuse typos, misspellings, and errors.  Patient seen and evaluated with the sports medicine fellow.  I agree with the above plan of care.  Patient's right arm pain likely secondary to cervical radiculopathy.  We will try methocarbamol and physical therapy.  Patient will follow-up with me in 3 weeks for reevaluation.  If symptoms persist, especially weakness, then I will strongly consider imaging including an MRI at that time.  Patient will call with questions or concerns in the interim or if symptoms worsen.

## 2018-11-10 ENCOUNTER — Telehealth: Payer: Self-pay | Admitting: Gastroenterology

## 2018-11-10 ENCOUNTER — Other Ambulatory Visit: Payer: Self-pay | Admitting: *Deleted

## 2018-11-10 MED ORDER — DEXILANT 60 MG PO CPDR: 60.0000 mg | DELAYED_RELEASE_CAPSULE | Freq: Every day | ORAL | 0 refills | Status: DC

## 2018-11-10 NOTE — Telephone Encounter (Signed)
Patient notified of prescription that was sent to pharmacy.

## 2018-11-10 NOTE — Telephone Encounter (Signed)
Yes, but once daily is sufficient.    Please send a new prescription for 90 day supply, zero refills to her preferred pharmacy.

## 2018-11-10 NOTE — Telephone Encounter (Signed)
Dr. Loletha Carrow, this patient was prescribed Dexilant by another provider and has completed her medication but she desires to know if you want her to continue this medication? Please advise?

## 2018-11-11 ENCOUNTER — Other Ambulatory Visit: Payer: Self-pay

## 2018-11-11 ENCOUNTER — Ambulatory Visit: Payer: Medicaid Other | Attending: Sports Medicine

## 2018-11-11 DIAGNOSIS — R293 Abnormal posture: Secondary | ICD-10-CM | POA: Diagnosis present

## 2018-11-11 DIAGNOSIS — M542 Cervicalgia: Secondary | ICD-10-CM | POA: Diagnosis not present

## 2018-11-11 DIAGNOSIS — R252 Cramp and spasm: Secondary | ICD-10-CM | POA: Diagnosis present

## 2018-11-11 NOTE — Therapy (Signed)
[place], [place], [address] Phone: [phone]   Fax:  [phone]  Physical Therapy Evaluation  Patient Details  Name: [name] MRN: [id number] Date of Birth: Aug 10, 1965 Referring Provider (PT): Lilia Argue, DO   Encounter Date: 11/11/2018  PT End of Session - 11/11/18 0917    Visit Number  1    Number of Visits  10    Date for PT Re-Evaluation  12/24/18    Authorization Type  MCD    PT Start Time  0915    PT Stop Time  5462    PT Time Calculation (min)  40 min    Activity Tolerance  Patient tolerated treatment well;No increased pain    Behavior During Therapy  WFL for tasks assessed/performed       Past Medical History:  Diagnosis Date  . Anemia   . Cough   . Diverticulitis with perforation 2010  . DVT (deep vein thrombosis) in pregnancy 11/24/2016  . DVT, lower extremity ([place])   . Endometrial cancer ([place])   . Family history of adverse reaction to anesthesia    pts daughter had difficulty awakening following anesthesia   . GERD (gastroesophageal reflux disease)   . Headache   . History of bronchitis   . History of ear infections   . Hx of migraines   . Hypertension   . IBS (irritable bowel syndrome)   . Kidney infection   . Lupus (systemic lupus erythematosus) ([place]) 02/2017  . Morbid obesity ([place])   . Ovarian cancer ([place]) dx'd 01/2015  . Pyelonephritis   . Scleroderma [facility])     Past Surgical History:  Procedure Laterality Date  . ABDOMINAL HYSTERECTOMY N/A 03/13/2015   Procedure: TOTAL HYSTERECTOMY ABDOMINAL BILATERAL SALPINGO OOPHORECTOMY RADICAL TUMOR [place];  Surgeon: Everitt Amber, MD;  [facility];  Service: Gynecology;  Laterality: N/A;  . BOWEL RESECTION  03/13/2015   Procedure: SMALL BOWEL RESECTION;  Surgeon: Everitt Amber, MD;  Location: [facility];  Service: Gynecology;;  . [place] and 1984  . COLOSTOMY  2008  . COLOSTOMY TAKEDOWN    . LAPAROTOMY  N/A 03/13/2015   Procedure: EXPLORATORY LAPAROTOMY;  Surgeon: Everitt Amber, MD;  [facility];  Service: Gynecology;  Laterality: N/A;  . TONSILLECTOMY AND ADENOIDECTOMY  1997  . VENTRAL HERNIA REPAIR  03/13/2015   Procedure: HERNIA REPAIR VENTRAL ADULT;  Surgeon: Everitt Amber, MD;  [facility]: [facility];  Service: Gynecology;;    There were no vitals filed for this visit.   Subjective Assessment - 11/11/18 0919    Subjective  She report pinched nerve in neck with pain to RT elbow lateral and lower neck.   No injury.    Pertinent History  LT RTC pain   injection on past few weeks and is better    Limitations  House hold activities   sleep   Diagnostic tests  none    Patient Stated Goals  Eliminate neck pain.    Currently in Pain?  Yes    Pain Score  3     Pain Location  Neck    Pain Orientation  Right    Pain Descriptors / Indicators  Aching;Tightness    Pain Type  Acute pain    Pain Radiating Towards  RT elbow    Pain Onset  1 to 4 weeks ago    Pain Frequency  Constant    Aggravating Factors   lying down  Pain Relieving Factors  heat with shower , muscle rub         [facility] PT Assessment - 11/11/18 0001      Assessment   Medical Diagnosis  cervicalgia    Referring Provider (PT)  Lilia Argue, DO    Onset Date/Surgical Date  --   3 weeks ago   Next MD Visit  3weeks 11/30/18    Prior Therapy  For shoulder not neck        Precautions   Precautions  None      Restrictions   Weight Bearing Restrictions  No      Balance Screen   Has the patient fallen in the past 6 months  No      Prior Function   Level of Independence  Independent      Cognition   Overall Cognitive Status  Within Functional Limits for tasks assessed      Posture/Postural Control   Posture Comments  significant forward head       ROM / Strength   AROM / PROM / Strength  AROM;PROM;Strength      AROM   AROM Assessment Site  Cervical    Cervical Flexion  40    Cervical Extension  50   pain levator  insertion   Cervical - Right Side Bend  50   pain RT neck   Cervical - Left Side Bend  30    Cervical - Right Rotation  75   RT neck pain   Cervical - Left Rotation  75                Objective measurements completed on examination: See above findings.              PT Education - 11/11/18 0955    Education Details  POC HEP    Person(s) Educated  Patient    Methods  Explanation;Demonstration;Tactile cues;Verbal cues;Handout    Comprehension  Returned demonstration;Verbalized understanding          PT Long Term Goals - 11/11/18 0956      PT LONG TERM GOAL #1   Title  "Pt will be independent with advanced HEP.     Baseline  no proram for neck    Time  5    Period  Weeks    Status  New      PT LONG TERM GOAL #2   Title  She will have full neck ROm with 1-2 max pain    Baseline  pain moderate to severe with full ROM to RT    Time  5    Period  Weeks    Status  New      PT LONG TERM GOAL #3   Title  She will report able to perform normal hme tasks with 1-2 max neck pain.    Baseline  often pain severe and has to stop the activity    Time  5    Period  Weeks    Status  New      PT LONG TERM GOAL #4   Title  She will report no RT arm pain or tightness    Baseline  daily pain and tightness into RT upper arm    Time  5    Period  Weeks    Status  New             Plan - 11/11/18 0925    Clinical Impression Statement  Ms Dinunzio presents with  RT neck pain and occasional radiation to lateal upper arm to [place]. Movements that tend to compress the RT side of her neck caus increased neck pain.   She has significant forward head posture. She should improve with skilled PT and HEP.    Examination-Participation Restrictions  Driving    Stability/Clinical Decision Making  Stable/Uncomplicated    Clinical Decision Making  Low    Rehab Potential  Good    PT Frequency  2x / week    PT Duration  6 weeks    PT Treatment/Interventions  Taping;Passive  range of motion;Dry needling;Patient/family education;Therapeutic exercise;Manual techniques;Traction;Ultrasound;Moist Heat;Iontophoresis 4mg /ml Dexamethasone    PT Next Visit Plan  REview HEP , Manual amd modalities for pain.    PT Home Exercise Plan  cervical retraction and levator stretch    Consulted and Agree with Plan of Care  Patient       Patient will benefit from skilled therapeutic intervention in order to improve the following deficits and impairments:  Obesity, Pain, Decreased activity tolerance, Postural dysfunction, Decreased range of motion  Visit Diagnosis: 1. Cervicalgia   2. Abnormal posture   3. Cramp and spasm        Problem List Patient Active Problem List   Diagnosis Date Noted  . Cough 06/11/2018  . Difficulty sleeping 06/11/2018  . Normocytic anemia 05/29/2018  . UTI (urinary tract infection) 05/07/2018  . Scleroderma ([place]) 04/22/2018  . Hemoptysis 04/22/2018  . Swelling of foot joint, right 04/08/2018  . Left shoulder pain 11/11/2017  . Enlarged lymph nodes 08/13/2017  . Cramping of hands 12/31/2016  . Fatigue 11/13/2016  . Positive ANA (antinuclear antibody) 09/23/2016  . Eczema 01/27/2016  . Pulmonary embolism ([place]) 09/12/2015  . Portacath in place 09/12/2015  . Long term current use of anticoagulant therapy 09/06/2015  . Slow rate of speech 08/22/2015  . Chemotherapy-induced peripheral neuropathy ([place]) 06/13/2015  . CKD (chronic kidney disease) stage 3, GFR 30-59 ml/min (HCC) 05/12/2015  . GERD (gastroesophageal reflux disease) 05/12/2015  . Ovarian carcinosarcoma, right ([place]) 03/29/2015  . Endometrial ca ([place]) 03/29/2015  . Ventral hernia without obstruction or gangrene   . Morbid obesity ([place]) 02/19/2015  . Essential hypertension, benign   . Stress at home 12/14/2014  . Left knee pain 08/04/2013  . Seasonal allergies 07/29/2013  . Right knee pain 11/28/2011    Darrel Hoover  PT 11/11/2018, 10:43 AM  [facility] [address] [place], [place], [address] Phone: [phone]   Fax:  [phone]  Name: TASNIM BALENTINE MRN: [id number] Date of Birth: 1965/09/06

## 2018-11-11 NOTE — Patient Instructions (Signed)
vator stretcj 20-30 sec 2-3 reps 4-5x/day, cervical retraction 2-3 reps hold 2-3 sec 6-8x/day

## 2018-11-15 ENCOUNTER — Encounter (HOSPITAL_COMMUNITY)
Admission: RE | Admit: 2018-11-15 | Discharge: 2018-11-15 | Disposition: A | Discharge status: Home / Self Care | Payer: Medicaid Other | Source: Ambulatory Visit | Attending: Gastroenterology | Admitting: Gastroenterology

## 2018-11-15 ENCOUNTER — Other Ambulatory Visit: Payer: Self-pay

## 2018-11-15 DIAGNOSIS — R112 Nausea with vomiting, unspecified: Secondary | ICD-10-CM | POA: Insufficient documentation

## 2018-11-15 MED ORDER — TECHNETIUM TC 99M SULFUR COLLOID
2.0000 | Freq: Once | INTRAVENOUS | Status: AC | PRN
  Administered 2018-11-15: 2 via ORAL

## 2018-11-17 ENCOUNTER — Telehealth: Payer: Self-pay | Admitting: *Deleted

## 2018-11-17 NOTE — Telephone Encounter (Signed)
[place] letter faxed electronically to Dr. Chrisandra Netters. Awaiting response.

## 2018-11-17 NOTE — Telephone Encounter (Addendum)
Hi there,  [name] is at high risk for recurrent VTE as she has had two prior clotting episodes. If the endoscopic procedure planned is low risk for bleeding, I would recommend she continue the xarelto uninterrupted.  Thanks Leeanne Rio, MD

## 2018-11-18 NOTE — Telephone Encounter (Signed)
Dr. Loletha Carrow, patients PCP contacted concerning Xarelto. Please see below:

## 2018-11-18 NOTE — Telephone Encounter (Signed)
I am glad we checked.  Have Ripley take her Xarelto early morning the day PRIOR to procedure, and NOT the day OF procedure.  I will most likely resume it later in the day after the endoscopy.

## 2018-11-18 NOTE — Telephone Encounter (Signed)
Noted. Xarelto early morning the day PRIOR to procedure, NOT DAY OF procedure.

## 2018-11-22 ENCOUNTER — Telehealth: Payer: Self-pay | Admitting: Gastroenterology

## 2018-11-22 NOTE — Telephone Encounter (Signed)
Pt informed that Medicaid will not cover Dexilant.   Pt requested an alt.

## 2018-11-22 NOTE — Telephone Encounter (Signed)
Please provide an alternative PPI. Per patient she cannot pay out of pocket for Dexilant. FYI- she is on Eliquis.

## 2018-11-24 MED ORDER — OMEPRAZOLE 40 MG PO CPDR: DELAYED_RELEASE_CAPSULE | ORAL | 3 refills | Status: DC

## 2018-11-24 NOTE — Telephone Encounter (Signed)
Pt has been notified. Rx sent to [facility] on [address] per her request.

## 2018-11-24 NOTE — Telephone Encounter (Signed)
Omeprazole 40 mg twice daily before breakfast and supper. Disp #60 , RF 3  She has two possible pharmacies listed in chart, so I did not send the script.  Please check on her preferred pharmacy and send script.

## 2018-11-25 ENCOUNTER — Other Ambulatory Visit: Payer: Self-pay

## 2018-11-25 ENCOUNTER — Ambulatory Visit: Payer: Medicaid Other

## 2018-11-25 DIAGNOSIS — M542 Cervicalgia: Secondary | ICD-10-CM

## 2018-11-25 DIAGNOSIS — R293 Abnormal posture: Secondary | ICD-10-CM

## 2018-11-25 DIAGNOSIS — R252 Cramp and spasm: Secondary | ICD-10-CM

## 2018-11-25 NOTE — Therapy (Signed)
[place], [place], [address] Phone: [phone]   Fax:  [phone]  Physical Therapy Treatment  Patient Details  Name: [name] MRN: [id number] Date of Birth: January 30, 1966 Referring Provider (PT): Lilia Argue, DO   Encounter Date: 11/25/2018  PT End of Session - 11/25/18 1035    Visit Number  2    Number of Visits  10    Date for PT Re-Evaluation  12/24/18    Authorization Type  MCD    Authorization Time Period  3 visits until 12/08/18    Authorization - Visit Number  1    Authorization - Number of Visits  3    PT Start Time  1000    PT Stop Time  1050    PT Time Calculation (min)  50 min    Activity Tolerance  Patient tolerated treatment well;No increased pain    Behavior During Therapy  WFL for tasks assessed/performed       Past Medical History:  Diagnosis Date  . Anemia   . Cough   . Diverticulitis with perforation 2010  . DVT (deep vein thrombosis) in pregnancy 11/24/2016  . DVT, lower extremity ([place])   . Endometrial cancer ([place])   . Family history of adverse reaction to anesthesia    pts daughter had difficulty awakening following anesthesia   . GERD (gastroesophageal reflux disease)   . Headache   . History of bronchitis   . History of ear infections   . Hx of migraines   . Hypertension   . IBS (irritable bowel syndrome)   . Kidney infection   . Lupus (systemic lupus erythematosus) ([place]) 02/2017  . Morbid obesity ([place])   . Ovarian cancer ([place]) dx'd 01/2015  . Pyelonephritis   . Scleroderma [facility])     Past Surgical History:  Procedure Laterality Date  . ABDOMINAL HYSTERECTOMY N/A 03/13/2015   Procedure: TOTAL HYSTERECTOMY ABDOMINAL BILATERAL SALPINGO OOPHORECTOMY RADICAL TUMOR [place];  Surgeon: Everitt Amber, MD;  [facility];  Service: Gynecology;  Laterality: N/A;  . BOWEL RESECTION  03/13/2015   Procedure: SMALL BOWEL RESECTION;  Surgeon: Everitt Amber, MD;  Location:  [facility];  Service: Gynecology;;  . [place] and 1984  . COLOSTOMY  2008  . COLOSTOMY TAKEDOWN    . LAPAROTOMY N/A 03/13/2015   Procedure: EXPLORATORY LAPAROTOMY;  Surgeon: Everitt Amber, MD;  [facility];  Service: Gynecology;  Laterality: N/A;  . TONSILLECTOMY AND ADENOIDECTOMY  1997  . VENTRAL HERNIA REPAIR  03/13/2015   Procedure: HERNIA REPAIR VENTRAL ADULT;  Surgeon: Everitt Amber, MD;  [facility];  Service: Gynecology;;    There were no vitals filed for this visit.  Subjective Assessment - 11/25/18 1004    Subjective  Pain now intermittant    Currently in Pain?  Yes    Pain Score  5     Pain Location  Neck    Pain Orientation  Right    Pain Descriptors / Indicators  Aching;Tightness    Pain Type  Acute pain    Pain Radiating Towards  Rt forearm    Pain Onset  1 to 4 weeks ago    Pain Frequency  Intermittent    Aggravating Factors   lying down    Pain Relieving Factors  heat    Effect of Pain on Daily Activities  heat  [place] Adult PT Treatment/Exercise - 11/25/18 0001      Exercises   Exercises  Neck      Neck Exercises: Seated   Neck Retraction  5 reps;3 secs    Cervical Rotation  Right;Left    Cervical Rotation Limitations  2 reps    Lateral Flexion  Right;Left    Lateral Flexion Limitations  2 reps    Shoulder Rolls  Backwards;10 reps    Other Seated Exercise  ER red band x 12      Modalities   Modalities  Moist Heat;Ultrasound      Moist Heat Therapy   Number Minutes Moist Heat  10 Minutes    Moist Heat Location  Shoulder;Cervical   rt     Ultrasound   Ultrasound Location  RT neck and shoulder    Ultrasound Parameters  100% 1MHZ 1.6 Wcm2    Ultrasound Goals  Pain      Manual Therapy   Manual Therapy  Soft tissue mobilization;Joint mobilization    Joint Mobilization  gentle Gr 2 to upper thoracic and cervical     Soft tissue mobilization  STM for post and anterior shoulder trigger points on left                   PT Long Term Goals - 11/11/18 0956      PT LONG TERM GOAL #1   Title  "Pt will be independent with advanced HEP.     Baseline  no proram for neck    Time  5    Period  Weeks    Status  New      PT LONG TERM GOAL #2   Title  She will have full neck ROm with 1-2 max pain    Baseline  pain moderate to severe with full ROM to RT    Time  5    Period  Weeks    Status  New      PT LONG TERM GOAL #3   Title  She will report able to perform normal hme tasks with 1-2 max neck pain.    Baseline  often pain severe and has to stop the activity    Time  5    Period  Weeks    Status  New      PT LONG TERM GOAL #4   Title  She will report no RT arm pain or tightness    Baseline  daily pain and tightness into RT upper arm    Time  5    Period  Weeks    Status  New            Plan - 11/25/18 1036    Clinical Impression Statement  She reported de creased pain post session She liked the [place] and heat.    Add band exer next for HEp    PT Treatment/Interventions  Taping;Passive range of motion;Dry needling;Patient/family education;Therapeutic exercise;Manual techniques;Traction;Ultrasound;Moist Heat;Iontophoresis 4mg /ml Dexamethasone    PT Next Visit Plan  REview HEP , Manual amd modalities for pain. add band stab exercises    PT Home Exercise Plan  cervical retraction and levator stretch    Consulted and Agree with Plan of Care  Patient       Patient will benefit from skilled therapeutic intervention in order to improve the following deficits and impairments:  Obesity, Pain, Decreased activity tolerance, Postural dysfunction, Decreased range of motion  Visit Diagnosis: Cervicalgia  Abnormal posture  Cramp and spasm  Problem List Patient Active Problem List   Diagnosis Date Noted  . Cough 06/11/2018  . Difficulty sleeping 06/11/2018  . Normocytic anemia 05/29/2018  . UTI (urinary tract infection) 05/07/2018  . Scleroderma ([place]) 04/22/2018   . Hemoptysis 04/22/2018  . Swelling of foot joint, right 04/08/2018  . Left shoulder pain 11/11/2017  . Enlarged lymph nodes 08/13/2017  . Cramping of hands 12/31/2016  . Fatigue 11/13/2016  . Positive ANA (antinuclear antibody) 09/23/2016  . Eczema 01/27/2016  . Pulmonary embolism ([place]) 09/12/2015  . Portacath in place 09/12/2015  . Long term current use of anticoagulant therapy 09/06/2015  . Slow rate of speech 08/22/2015  . Chemotherapy-induced peripheral neuropathy ([place]) 06/13/2015  . CKD (chronic kidney disease) stage 3, GFR 30-59 ml/min (HCC) 05/12/2015  . GERD (gastroesophageal reflux disease) 05/12/2015  . Ovarian carcinosarcoma, right ([place]) 03/29/2015  . Endometrial ca ([place]) 03/29/2015  . Ventral hernia without obstruction or gangrene   . Morbid obesity ([place]) 02/19/2015  . Essential hypertension, benign   . Stress at home 12/14/2014  . Left knee pain 08/04/2013  . Seasonal allergies 07/29/2013  . Right knee pain 11/28/2011    Darrel Hoover  PT 11/25/2018, 10:39 AM  [facility] [address] [place], [place], [address] Phone: [phone]   Fax:  [phone]  Name: [name] MRN: [id number] Date of Birth: 1965-11-05

## 2018-11-26 ENCOUNTER — Other Ambulatory Visit: Payer: Self-pay | Admitting: *Deleted

## 2018-11-26 MED ORDER — TOVIAZ 8 MG PO TB24: ORAL_TABLET | ORAL | 0 refills | Status: DC

## 2018-11-29 ENCOUNTER — Encounter: Payer: Self-pay | Admitting: *Deleted

## 2018-11-29 ENCOUNTER — Other Ambulatory Visit: Payer: Self-pay | Admitting: *Deleted

## 2018-11-29 ENCOUNTER — Telehealth: Payer: Self-pay | Admitting: *Deleted

## 2018-11-29 DIAGNOSIS — R112 Nausea with vomiting, unspecified: Secondary | ICD-10-CM

## 2018-11-29 NOTE — Telephone Encounter (Signed)
Patient scheduled for the following:   12/07/2018 at 9:00 am Pre-visit with nurse (refer to Dr. Loletha Carrow telephone note on 8/12 concerning Xarelto)  12/17/2018 at 9:10 am COVID-19 screening  12/21/2018 at 9:30 am EGD at [facility]  Letter sent to patient   Below was copied and pasted from the telephone note on 8/12, from Dr. Loletha Carrow: Have Dylyn take her Xarelto early morning the day PRIOR to procedure, and NOT the day OF procedure.  I will most likely resume it later in the day after the endoscopy.

## 2018-11-30 ENCOUNTER — Ambulatory Visit (INDEPENDENT_AMBULATORY_CARE_PROVIDER_SITE_OTHER): Payer: Medicaid Other | Admitting: Sports Medicine

## 2018-11-30 ENCOUNTER — Other Ambulatory Visit: Payer: Self-pay

## 2018-11-30 ENCOUNTER — Encounter: Payer: Self-pay | Admitting: Sports Medicine

## 2018-11-30 VITALS — BP 133/70 | Ht 65.0 in | Wt 380.0 lb

## 2018-11-30 DIAGNOSIS — M542 Cervicalgia: Secondary | ICD-10-CM

## 2018-11-30 DIAGNOSIS — M5412 Radiculopathy, cervical region: Secondary | ICD-10-CM | POA: Diagnosis not present

## 2018-11-30 NOTE — Progress Notes (Signed)
   [facility] [address] [place], [place] [address] Phone: [phone] Fax: [phone]   Patient Name: [name] Date of Birth: [birth year] Medical Record Number: [id number] Gender: female Date of Encounter: 11/30/2018  SUBJECTIVE:      Chief Complaint:  Right neck and arm pain   HPI:  [name] is following up for right arm and neck pain.  We had diagnosed her with cervicalgia 3 weeks ago and sent her to physical therapy.  She really enjoyed the physical therapy and plans to go back.  She is having intermittent pain, but describes it as much less than before.  She notices it when she does extensive housework, so has begun to spread out her chores to avoid aggravating symptoms.  She is intermittently needing the muscle relaxer, mainly at night.  For summer, she was doing water aerobics and stated that really helped her with weight loss and managing physical symptoms of pain.  She denies weakness, swelling, bruising, skin changes, erythema.  She is scheduled for a GI work-up for digestive issues in the coming weeks.     ROS:     See HPI.   PERTINENT  PMH / PSH / FH / SH:  Past Medical, Surgical, Social, and Family History Reviewed & Updated in the EMR.    OBJECTIVE:  BP 133/70   Ht 5\' 5"  (1.651 m)   Wt (!) 380 lb (172.4 kg)   LMP 10/26/2013   BMI 63.24 kg/m  Physical Exam:  Vital signs are reviewed.   GEN: Alert and oriented, NAD Pulm: Breathing unlabored PSY: normal mood, congruent affect  MSK: Neck No gross deformity, swelling, bruising. No midline/bony TTP. Spasm in  right upper trap and rhomboids with associated mild TTP Pain in active right rotation of the neck that improves passively BUE strength 5/5 in biceps RUE strength 4+/5 in triceps Sensation intact to light touch.   2+ equal reflexes in triceps, biceps, brachioradialis tendons. NV intact distal BUEs.  ASSESSMENT & PLAN:   1. Cervicalgia Patient is  improving.  Continue physical therapy, patient will call if she needs a new prescription to extend physical therapy sessions.  Recommended calling local [facility] to inquire about aquatic therapy if pool is open.  Continue muscle relaxer PRN nightly.  We will follow-up in 4 weeks.   Lanier Clam, DO, ATC Sports Medicine Fellow  Patient seen and evaluated with the sports medicine fellow.  I agree with the above plan of care.  Patient does still have a trace amount of weakness in the right triceps but she seems to be responding well to physical therapy.  We will continue with PT for the time being and she will follow-up with me again in 4 weeks.  Given her overall improvement, I will hold on imaging at this time.  However, if she fails to continue to improve then I may need to revisit this at her follow-up.  She will call with questions or concerns in the interim.

## 2018-12-01 MED FILL — XARELTO 20 MG TABLET: 20 | 90 days supply | Qty: 90 | Fill #1

## 2018-12-02 ENCOUNTER — Ambulatory Visit: Payer: Medicaid Other | Admitting: Physical Therapy

## 2018-12-07 ENCOUNTER — Ambulatory Visit (AMBULATORY_SURGERY_CENTER): Payer: Self-pay

## 2018-12-07 ENCOUNTER — Other Ambulatory Visit: Payer: Self-pay

## 2018-12-07 VITALS — Ht 65.5 in | Wt 388.2 lb

## 2018-12-07 DIAGNOSIS — R112 Nausea with vomiting, unspecified: Secondary | ICD-10-CM

## 2018-12-07 NOTE — Progress Notes (Signed)
Denies allergies to eggs or soy products. Denies complication of anesthesia or sedation. Denies use of weight loss medication. Denies use of O2.   Emmi instructions given for endoscopy.  

## 2018-12-09 ENCOUNTER — Other Ambulatory Visit: Payer: Self-pay

## 2018-12-09 ENCOUNTER — Ambulatory Visit: Payer: Medicaid Other | Attending: Sports Medicine

## 2018-12-09 DIAGNOSIS — G8929 Other chronic pain: Secondary | ICD-10-CM | POA: Diagnosis present

## 2018-12-09 DIAGNOSIS — M542 Cervicalgia: Secondary | ICD-10-CM | POA: Diagnosis present

## 2018-12-09 DIAGNOSIS — R293 Abnormal posture: Secondary | ICD-10-CM | POA: Diagnosis present

## 2018-12-09 DIAGNOSIS — R252 Cramp and spasm: Secondary | ICD-10-CM | POA: Diagnosis present

## 2018-12-09 DIAGNOSIS — M25612 Stiffness of left shoulder, not elsewhere classified: Secondary | ICD-10-CM | POA: Diagnosis present

## 2018-12-09 DIAGNOSIS — M25511 Pain in right shoulder: Secondary | ICD-10-CM | POA: Diagnosis present

## 2018-12-09 DIAGNOSIS — M25512 Pain in left shoulder: Secondary | ICD-10-CM | POA: Insufficient documentation

## 2018-12-09 NOTE — Patient Instructions (Signed)
Green band Hor abduction and scap retraction x 12 daily

## 2018-12-09 NOTE — Therapy (Signed)
[place], [place], [address] Phone: [phone]   Fax:  [phone]  Physical Therapy Treatment  Patient Details  Name: [name] MRN: [id number] Date of Birth: 1966/01/20 Referring Provider (PT): Lilia Argue, DO   Encounter Date: 12/09/2018  PT End of Session - 12/09/18 0907    Visit Number  3    Number of Visits  10    Date for PT Re-Evaluation  12/24/18    Authorization Type  MCD    PT Start Time  0906    PT Stop Time  1000    PT Time Calculation (min)  54 min    Activity Tolerance  Patient tolerated treatment well;No increased pain    Behavior During Therapy  WFL for tasks assessed/performed       Past Medical History:  Diagnosis Date  . Allergy   . Anemia   . Anxiety   . Arthritis   . Blood transfusion without reported diagnosis   . Cough   . Diverticulitis with perforation 2010  . DVT (deep vein thrombosis) in pregnancy 11/24/2016  . DVT, lower extremity ([place])   . Endometrial cancer ([place])   . Family history of adverse reaction to anesthesia    pts daughter had difficulty awakening following anesthesia   . GERD (gastroesophageal reflux disease)   . Headache   . History of bronchitis   . History of ear infections   . Hx of migraines   . Hyperlipidemia   . Hypertension   . IBS (irritable bowel syndrome)   . Kidney infection   . Lupus (systemic lupus erythematosus) ([place]) 02/2017  . Morbid obesity ([place])   . Ovarian cancer ([place]) dx'd 01/2015  . Pyelonephritis   . Scleroderma [facility])     Past Surgical History:  Procedure Laterality Date  . ABDOMINAL HYSTERECTOMY N/A 03/13/2015   Procedure: TOTAL HYSTERECTOMY ABDOMINAL BILATERAL SALPINGO OOPHORECTOMY RADICAL TUMOR [place];  Surgeon: Everitt Amber, MD;  Location: [facility];  Service: Gynecology;  Laterality: N/A;  . BOWEL RESECTION  03/13/2015   Procedure: SMALL BOWEL RESECTION;  Surgeon: Everitt Amber, MD;  Location: [facility];  Service:  Gynecology;;  . [place] and 1984  . COLOSTOMY  2008  . COLOSTOMY TAKEDOWN    . LAPAROTOMY N/A 03/13/2015   Procedure: EXPLORATORY LAPAROTOMY;  Surgeon: Everitt Amber, MD;  Location: [facility];  Service: Gynecology;  Laterality: N/A;  . TONSILLECTOMY AND ADENOIDECTOMY  1997  . VENTRAL HERNIA REPAIR  03/13/2015   Procedure: HERNIA REPAIR VENTRAL ADULT;  Surgeon: Everitt Amber, MD;  Location: [facility];  Service: Gynecology;;    There were no vitals filed for this visit.  Subjective Assessment - 12/09/18 0909    Subjective  A little pain RT lower neck. More I do It may incr pain . Doing something all day  long may increase pain over the day. Used some of the biofreeze. Minimal meds  muscle relaxor 1x in past week.    Pain Score  2     Pain Location  Neck    Pain Orientation  Right    Pain Descriptors / Indicators  Aching;Tightness    Pain Type  Acute pain    Pain Onset  More than a month ago    Pain Frequency  Intermittent         [facility] PT Assessment - 12/09/18 0001      AROM   Cervical Flexion  60    Cervical Extension  50   pain   Cervical - Right Side Bend  50    Cervical - Left Side Bend  40    Cervical - Right Rotation  70    Cervical - Left Rotation  65                   [facility] Adult PT Treatment/Exercise - 12/09/18 0001      Neck Exercises: Seated   Neck Retraction  5 reps;3 secs    Cervical Rotation  Right;Left    Cervical Rotation Limitations  2 reps  20 sec    Lateral Flexion  Right;Left    Lateral Flexion Limitations  2 reps, 20 sec    Shoulder Rolls  Backwards;10 reps    Other Seated Exercise  ER with scapula retraction green band x 12 and 1 set 12 hor abduction.       Moist Heat Therapy   Number Minutes Moist Heat  10 Minutes    Moist Heat Location  Shoulder;Cervical      Ultrasound   Ultrasound Location  RT neck and shoulder    Ultrasound Parameters  100% 1MHz, 1.6Wcm2    Ultrasound Goals  Pain      Manual Therapy   Joint Mobilization   gentle Gr 2-3 to upper thoracic and cervical     Soft tissue mobilization  STM for post and anterior shoulder trigger points on RT      Neck Exercises: Stretches   Upper Trapezius Stretch  Right;Left;2 reps;20 seconds    Levator Stretch  Right;Left;2 reps;20 seconds             PT Education - 12/09/18 0952    Education Details  HEP    Person(s) Educated  Patient    Methods  Explanation;Demonstration;Tactile cues;Verbal cues;Handout    Comprehension  Returned demonstration;Verbalized understanding          PT Long Term Goals - 12/09/18 0913      PT LONG TERM GOAL #1   Title  "Pt will be independent with advanced HEP.     Baseline  She is able to do HEP corectly issued so far    Time  5    Period  Weeks    Status  On-going      PT LONG TERM GOAL #2   Title  She will have full neck ROm with 1-2 max pain    Baseline  pain mild to moderate . ROM not full but improved except rotation  decr    Time  5    Period  Weeks    Status  On-going      PT LONG TERM GOAL #3   Title  She will report able to perform normal hme tasks with 1-2 max neck pain.    Baseline  moderate as she modifies now taking more breaks. to limit pain increase    Time  5    Period  Weeks    Status  On-going      PT LONG TERM GOAL #4   Title  She will report no RT arm pain or tightness    Baseline  Pain now decreased but there constantly    Time  5    Period  Weeks    Status  On-going      PT LONG TERM GOAL #5   Title  Pt will be able to sleep on left side at night with 50% greater comfort for more restorative sleep at night  Baseline  can sleep on shoulder but sometimes wakes up sore  but is some better    Time  6    Period  Weeks    Status  New      Additional Long Term Goals   Additional Long Term Goals  Yes      PT LONG TERM GOAL #6   Title  Demo good postureal awareness.    Baseline  forward head and rounded shoulders    Time  4    Period  Weeks    Status  New             Plan - 12/09/18 0908    Clinical Impression Statement  Decreased pain and increased ROM. She is aslo aware and making modifications to activity.  She should make fyrther progress with skilled PT    Examination-Participation Restrictions  Driving   Home tasks   Rehab Potential  Good    PT Frequency  2x / week    PT Duration  4 weeks    PT Treatment/Interventions  Taping;Passive range of motion;Dry needling;Patient/family education;Therapeutic exercise;Manual techniques;Traction;Ultrasound;Moist Heat;Iontophoresis 4mg /ml Dexamethasone    PT Next Visit Plan  Progress HEP , continue manual and modalities    PT Home Exercise Plan  cervical retraction and levator stretch, scapula retraction with ER  and hor abduction green band,    Consulted and Agree with Plan of Care  Patient       Patient will benefit from skilled therapeutic intervention in order to improve the following deficits and impairments:  Obesity, Pain, Decreased activity tolerance, Postural dysfunction, Decreased range of motion  Visit Diagnosis: Abnormal posture  Cervicalgia     Problem List Patient Active Problem List   Diagnosis Date Noted  . Cough 06/11/2018  . Difficulty sleeping 06/11/2018  . Normocytic anemia 05/29/2018  . UTI (urinary tract infection) 05/07/2018  . Scleroderma ([place]) 04/22/2018  . Hemoptysis 04/22/2018  . Swelling of foot joint, right 04/08/2018  . Left shoulder pain 11/11/2017  . Enlarged lymph nodes 08/13/2017  . Cramping of hands 12/31/2016  . Fatigue 11/13/2016  . Positive ANA (antinuclear antibody) 09/23/2016  . Eczema 01/27/2016  . Pulmonary embolism ([place]) 09/12/2015  . Portacath in place 09/12/2015  . Long term current use of anticoagulant therapy 09/06/2015  . Slow rate of speech 08/22/2015  . Chemotherapy-induced peripheral neuropathy ([place]) 06/13/2015  . CKD (chronic kidney disease) stage 3, GFR 30-59 ml/min ([facility]) 05/12/2015  . GERD (gastroesophageal reflux  disease) 05/12/2015  . Ovarian carcinosarcoma, right ([place]) 03/29/2015  . Endometrial ca ([place]) 03/29/2015  . Ventral hernia without obstruction or gangrene   . Morbid obesity ([place]) 02/19/2015  . Essential hypertension, benign   . Stress at home 12/14/2014  . Left knee pain 08/04/2013  . Seasonal allergies 07/29/2013  . Right knee pain 11/28/2011    Darrel Hoover  PT 12/09/2018, 9:57 AM  [facility] [address] [place], [place], [address] Phone: [phone]   Fax:  [phone]  Name: [name] MRN: [id number] Date of Birth: 01/24/1966

## 2018-12-15 ENCOUNTER — Other Ambulatory Visit: Payer: Self-pay

## 2018-12-16 ENCOUNTER — Encounter (HOSPITAL_COMMUNITY): Payer: Self-pay | Admitting: *Deleted

## 2018-12-16 NOTE — Progress Notes (Signed)
Pre procedure Endo call.  Pt denies CVA incident. States she would like port accessed for procedure.  Stated she had received instructions regarding Xarelto.

## 2018-12-17 ENCOUNTER — Other Ambulatory Visit (HOSPITAL_COMMUNITY): Payer: Medicaid Other

## 2018-12-17 ENCOUNTER — Other Ambulatory Visit (HOSPITAL_COMMUNITY)
Admission: RE | Admit: 2018-12-17 | Discharge: 2018-12-17 | Disposition: A | Discharge status: Home / Self Care | Payer: Medicaid Other | Source: Ambulatory Visit | Attending: Gastroenterology | Admitting: Gastroenterology

## 2018-12-17 ENCOUNTER — Other Ambulatory Visit: Payer: Self-pay

## 2018-12-17 DIAGNOSIS — Z20828 Contact with and (suspected) exposure to other viral communicable diseases: Secondary | ICD-10-CM | POA: Insufficient documentation

## 2018-12-17 DIAGNOSIS — Z01812 Encounter for preprocedural laboratory examination: Secondary | ICD-10-CM | POA: Diagnosis present

## 2018-12-19 LAB — NOVEL CORONAVIRUS, NAA (HOSP ORDER, SEND-OUT TO REF LAB; TAT 18-24 HRS): SARS-CoV-2, NAA: NOT DETECTED

## 2018-12-19 MED ORDER — ALBUTEROL SULFATE HFA 108 (90 BASE) MCG/ACT IN AERS: 2.0000 | INHALATION_SPRAY | Freq: Four times a day (QID) | RESPIRATORY_TRACT | 3 refills | Status: DC | PRN

## 2018-12-20 ENCOUNTER — Ambulatory Visit: Payer: Medicaid Other

## 2018-12-21 ENCOUNTER — Ambulatory Visit (HOSPITAL_COMMUNITY)
Admission: RE | Admit: 2018-12-21 | Discharge: 2018-12-21 | Disposition: A | Discharge status: Home / Self Care | Payer: Medicaid Other | Attending: Gastroenterology | Admitting: Gastroenterology

## 2018-12-21 ENCOUNTER — Encounter (HOSPITAL_COMMUNITY): Payer: Self-pay

## 2018-12-21 ENCOUNTER — Ambulatory Visit (HOSPITAL_COMMUNITY): Payer: Medicaid Other

## 2018-12-21 ENCOUNTER — Encounter (HOSPITAL_COMMUNITY)
Admission: RE | Disposition: A | Discharge status: Home / Self Care | Payer: Self-pay | Source: Home / Self Care | Attending: Gastroenterology

## 2018-12-21 ENCOUNTER — Other Ambulatory Visit: Payer: Self-pay

## 2018-12-21 DIAGNOSIS — R110 Nausea: Secondary | ICD-10-CM | POA: Diagnosis not present

## 2018-12-21 DIAGNOSIS — K219 Gastro-esophageal reflux disease without esophagitis: Secondary | ICD-10-CM

## 2018-12-21 DIAGNOSIS — K449 Diaphragmatic hernia without obstruction or gangrene: Secondary | ICD-10-CM | POA: Insufficient documentation

## 2018-12-21 DIAGNOSIS — I10 Essential (primary) hypertension: Secondary | ICD-10-CM | POA: Insufficient documentation

## 2018-12-21 DIAGNOSIS — Z8542 Personal history of malignant neoplasm of other parts of uterus: Secondary | ICD-10-CM | POA: Diagnosis not present

## 2018-12-21 DIAGNOSIS — Z90710 Acquired absence of both cervix and uterus: Secondary | ICD-10-CM | POA: Diagnosis not present

## 2018-12-21 DIAGNOSIS — Z7901 Long term (current) use of anticoagulants: Secondary | ICD-10-CM | POA: Diagnosis not present

## 2018-12-21 DIAGNOSIS — Z86718 Personal history of other venous thrombosis and embolism: Secondary | ICD-10-CM | POA: Diagnosis not present

## 2018-12-21 DIAGNOSIS — Z8543 Personal history of malignant neoplasm of ovary: Secondary | ICD-10-CM | POA: Diagnosis not present

## 2018-12-21 DIAGNOSIS — Z6841 Body Mass Index (BMI) 40.0 and over, adult: Secondary | ICD-10-CM | POA: Insufficient documentation

## 2018-12-21 DIAGNOSIS — R112 Nausea with vomiting, unspecified: Secondary | ICD-10-CM

## 2018-12-21 HISTORY — PX: ESOPHAGOGASTRODUODENOSCOPY (EGD) WITH PROPOFOL: SHX5813

## 2018-12-21 HISTORY — DX: Other specified postprocedural states: Z98.890

## 2018-12-21 HISTORY — DX: Nausea with vomiting, unspecified: R11.2

## 2018-12-21 SURGERY — ESOPHAGOGASTRODUODENOSCOPY (EGD) WITH PROPOFOL
Anesthesia: Monitor Anesthesia Care

## 2018-12-21 MED ORDER — LIDOCAINE 2% (20 MG/ML) 5 ML SYRINGE
INTRAMUSCULAR | Status: DC | PRN
  Administered 2018-12-21: 100 mg via INTRAVENOUS

## 2018-12-21 MED ORDER — HEPARIN SOD (PORK) LOCK FLUSH 100 UNIT/ML IV SOLN
500.0000 [IU] | INTRAVENOUS | Status: AC | PRN
  Administered 2018-12-21: 10:00:00 500 [IU]

## 2018-12-21 MED ORDER — PROPOFOL 10 MG/ML IV BOLUS
INTRAVENOUS | Status: DC | PRN
  Administered 2018-12-21: 30 mg via INTRAVENOUS
  Administered 2018-12-21 (×4): 20 mg via INTRAVENOUS

## 2018-12-21 MED ORDER — LACTATED RINGERS IV SOLN: INTRAVENOUS | Status: DC

## 2018-12-21 MED ORDER — ONDANSETRON HCL 4 MG/2ML IJ SOLN
INTRAMUSCULAR | Status: DC | PRN
  Administered 2018-12-21: 4 mg via INTRAVENOUS

## 2018-12-21 MED ORDER — PROPOFOL 10 MG/ML IV BOLUS
INTRAVENOUS | Status: AC
  Filled 2018-12-21: qty 60

## 2018-12-21 MED ORDER — SODIUM CHLORIDE 0.9 % IV SOLN
INTRAVENOUS | Status: DC
  Administered 2018-12-21: 10:00:00 via INTRAVENOUS

## 2018-12-21 SURGICAL SUPPLY — 15 items

## 2018-12-21 NOTE — H&P (Signed)
History:  This patient presents for endoscopic testing for gerd, nausea.  [name] Referring physician: Leeanne Rio, MD  Past Medical History: Past Medical History:  Diagnosis Date  . Allergy   . Anemia   . Anxiety   . Arthritis   . Blood transfusion without reported diagnosis   . Cough   . Diverticulitis with perforation 2010  . DVT (deep vein thrombosis) in pregnancy 11/24/2016  . DVT, lower extremity ([place])   . Endometrial cancer ([place])   . Family history of adverse reaction to anesthesia    pts daughter had difficulty awakening following anesthesia, long time to wake up  . GERD (gastroesophageal reflux disease)   . Headache   . History of bronchitis   . History of ear infections   . Hx of migraines   . Hyperlipidemia   . Hypertension   . IBS (irritable bowel syndrome)   . Kidney infection   . Lupus (systemic lupus erythematosus) ([name]) 02/2017  . Morbid obesity ([name])   . Ovarian cancer ([name]) dx'd 01/2015  . PONV (postoperative nausea and vomiting)   . Pyelonephritis   . Scleroderma [facility])      Past Surgical History: Past Surgical History:  Procedure Laterality Date  . ABDOMINAL HYSTERECTOMY N/A 03/13/2015   Procedure: TOTAL HYSTERECTOMY ABDOMINAL BILATERAL SALPINGO OOPHORECTOMY RADICAL TUMOR [name];  Surgeon: Everitt Amber, MD;  [facility];  Service: Gynecology;  Laterality: N/A;  . BOWEL RESECTION  03/13/2015   Procedure: SMALL BOWEL RESECTION;  Surgeon: Everitt Amber, MD;  [facility];  Service: Gynecology;;  . [name] and 1984  . COLOSTOMY  2008  . COLOSTOMY TAKEDOWN    . LAPAROTOMY N/A 03/13/2015   Procedure: EXPLORATORY LAPAROTOMY;  Surgeon: Everitt Amber, MD;  [facility];  Service: Gynecology;  Laterality: N/A;  . TONSILLECTOMY AND ADENOIDECTOMY  1997  . VENTRAL HERNIA REPAIR  03/13/2015   Procedure: HERNIA REPAIR VENTRAL ADULT;  Surgeon: Everitt Amber, MD;  [facility];  Service: Gynecology;;     Allergies: Allergies  Allergen Reactions  . Penicillins Rash    Has patient had a PCN reaction causing immediate rash, facial/tongue/throat swelling, SOB or lightheadedness with hypotension: no Has patient had a PCN reaction causing severe rash involving mucus membranes or skin necrosis: no Has patient had a PCN reaction that required hospitalization in the hospital at the time Has patient had a PCN reaction occurring within the last 10 years: no If all of the above answers are "NO", then may proceed with Cephalosporin use.     Outpatient Meds: Current Facility-Administered Medications  Medication Dose Route Frequency Provider Last Rate Last Dose  . 0.9 %  sodium chloride infusion   Intravenous Continuous Danis, Estill Cotta III, MD      . lactated ringers infusion   Intravenous Continuous Danis, Kirke Corin, MD       Facility-Administered Medications Ordered in Other Encounters  Medication Dose Route Frequency Provider Last Rate Last Dose  . sodium chloride flush (NS) 0.9 % injection 10 mL  10 mL Intravenous PRN Livesay, Lennis P, MD   10 mL at 05/31/15 0300  . sodium chloride flush (NS) 0.9 % injection 10 mL  10 mL Intravenous PRN Livesay, Lennis P, MD   10 mL at 08/02/15 1442      ___________________________________________________________________ Objective   Exam:  BP (!) 172/98   Pulse 72   Temp 97.8 F (36.6 C) (Oral)   Resp 13  Ht 5' 5.5" (1.664 m)   Wt (!) 172.4 kg   LMP 10/26/2013   SpO2 100%   BMI 62.27 kg/m    CV: RRR without murmur, S1/S2, no JVD, no peripheral edema  Resp: clear to auscultation bilaterally, normal RR and effort noted  GI: soft, no tenderness, with active bowel sounds. Limited exam to assess mass or HSM due to body habitus  Neuro: awake, alert and oriented x 3. Normal gross motor function and fluent speech   Assessment:  [name], nausea  Plan:  EGD   [name]

## 2018-12-21 NOTE — Anesthesia Postprocedure Evaluation (Signed)
Anesthesia Post Note  Patient: [name]  Procedure(s) Performed: ESOPHAGOGASTRODUODENOSCOPY (EGD) WITH PROPOFOL (N/A )     Patient location during evaluation: Endoscopy Anesthesia Type: MAC Level of consciousness: awake and alert Pain management: pain level controlled Vital Signs Assessment: post-procedure vital signs reviewed and stable Respiratory status: spontaneous breathing, nonlabored ventilation, respiratory function stable and patient connected to nasal cannula oxygen Cardiovascular status: blood pressure returned to baseline and stable Postop Assessment: no apparent nausea or vomiting Anesthetic complications: no    Last Vitals:  Vitals:   12/21/18 1025 12/21/18 1030  BP:  (!) 159/88  Pulse: 62 61  Resp: 19 17  Temp:    SpO2: 98% 100%    Last Pain:  Vitals:   12/21/18 1025  TempSrc:   PainSc: 0-No pain                 Alyne Martinson L Aashritha Miedema

## 2018-12-21 NOTE — Transfer of Care (Signed)
Immediate Anesthesia Transfer of Care Note  Patient: [name]  Procedure(s) Performed: ESOPHAGOGASTRODUODENOSCOPY (EGD) WITH PROPOFOL (N/A )  Patient Location: PACU  Anesthesia Type:MAC  Level of Consciousness: awake, alert  and oriented  Airway & Oxygen Therapy: Patient Spontanous Breathing and Patient connected to nasal cannula oxygen  Post-op Assessment: Report given to RN and Post -op Vital signs reviewed and stable  Post vital signs: Reviewed and stable  Last Vitals:  Vitals Value Taken Time  BP    Temp    Pulse    Resp    SpO2      Last Pain:  Vitals:   12/21/18 0833  TempSrc: Oral  PainSc: 0-No pain         Complications: No apparent anesthesia complications

## 2018-12-21 NOTE — Anesthesia Preprocedure Evaluation (Addendum)
Anesthesia Evaluation  Patient identified by MRN, date of birth, ID band Patient awake    Reviewed: Allergy & Precautions, NPO status , Patient's Chart, lab work & pertinent test results  History of Anesthesia Complications (+) PONV  Airway Mallampati: I  TM Distance: >3 FB Neck ROM: Full    Dental no notable dental hx. (+) Teeth Intact, Dental Advisory Given   Pulmonary shortness of breath,    Pulmonary exam normal breath sounds clear to auscultation       Cardiovascular hypertension, + DVT  Normal cardiovascular exam Rhythm:Regular Rate:Normal  HLD  TTE 2018 - Normal LV systolic function; mild diastolic dysfunction.   Neuro/Psych  Headaches, PSYCHIATRIC DISORDERS Anxiety    GI/Hepatic Neg liver ROS, GERD  Medicated,  Endo/Other  Morbid obesityLupus  Renal/GU Renal InsufficiencyRenal disease  negative genitourinary   Musculoskeletal  (+) Arthritis ,   Abdominal   Peds  Hematology  (+) Blood dyscrasia (on xarelto), ,   Anesthesia Other Findings   Reproductive/Obstetrics                            Anesthesia Physical Anesthesia Plan  ASA: III  Anesthesia Plan: MAC   Post-op Pain Management:    Induction: Intravenous  PONV Risk Score and Plan: Propofol infusion and Treatment may vary due to age or medical condition  Airway Management Planned: Natural Airway  Additional Equipment:   Intra-op Plan:   Post-operative Plan:   Informed Consent: I have reviewed the patients History and Physical, chart, labs and discussed the procedure including the risks, benefits and alternatives for the proposed anesthesia with the patient or authorized representative who has indicated his/her understanding and acceptance.     Dental advisory given  Plan Discussed with: CRNA  Anesthesia Plan Comments:         Anesthesia Quick Evaluation

## 2018-12-21 NOTE — Op Note (Signed)
[facility] Patient Name: [name] Procedure Date: 12/21/2018 MRN: [id number] Attending MD: Estill Cotta. Loletha Carrow , MD Date of Birth: Nov 17, 1965 CSN: [id number] Age: 53 Admit Type: Inpatient Procedure:                Upper GI endoscopy Indications:              Esophageal reflux symptoms that persist despite                            appropriate therapy (UGIS normal except                            demonstrating reflux, normal gastric emptying study) Providers:                Estill Cotta. Loletha Carrow, MD, Cleda Daub, RN, Ladona Ridgel, Technician Referring MD:             Blane Ohara McDiarmid MD Medicines:                Monitored Anesthesia Care Complications:            No immediate complications. Estimated Blood Loss:     Estimated blood loss: none. Procedure:                Pre-Anesthesia Assessment:                           - Prior to the procedure, a History and Physical                            was performed, and patient medications and                            allergies were reviewed. The patient's tolerance of                            previous anesthesia was also reviewed. The risks                            and benefits of the procedure and the sedation                            options and risks were discussed with the patient.                            All questions were answered, and informed consent                            was obtained. Prior Anticoagulants: The patient has                            taken Xarelto (rivaroxaban), last dose was 1 day  prior to procedure. ASA Grade Assessment: III - A                            patient with severe systemic disease. After                            reviewing the risks and benefits, the patient was                            deemed in satisfactory condition to undergo the                            procedure.                           After obtaining  informed consent, the endoscope was                            passed under direct vision. Throughout the                            procedure, the patient's blood pressure, pulse, and                            oxygen saturations were monitored continuously. The                            [id number]) Olympus gastroscope was                            introduced through the mouth, and advanced to the                            second part of duodenum. The upper GI endoscopy was                            accomplished without difficulty. The patient                            tolerated the procedure fairly well. Scope In: Scope Out: Findings:      An approximately 5 cm hiatal hernia was present with a paraesophageal       component.      There is no endoscopic evidence of esophagitis or stricture in the       entire esophagus.      The stomach was normal.      The cardia and gastric fundus were normal on retroflexion.      The examined duodenum was normal. Impression:               - 5 cm hiatal hernia.                           - Normal stomach.                           -  Normal examined duodenum.                           - No specimens collected. Moderate Sedation:      Not Applicable - Patient had care per Anesthesia. Recommendation:           - Patient has a contact number available for                            emergencies. The signs and symptoms of potential                            delayed complications were discussed with the                            patient. Return to normal activities tomorrow.                            Written discharge instructions were provided to the                            patient.                           - Resume previous diet.                           - Resume Xarelto (rivaroxaban) at prior dose today.                           - Consider evaluation for hiatal hernia                            repair/fundoplication vs bariatric  surgery for long                            term control of severe reflux symptoms. Procedure Code(s):        --- Professional ---                           [phone], Esophagogastroduodenoscopy, flexible,                            transoral; diagnostic, including collection of                            specimen(s) by brushing or washing, when performed                            (separate procedure) Diagnosis Code(s):        --- Professional ---                           K44.9, Diaphragmatic hernia without obstruction or                            gangrene  K21.9, Gastro-esophageal reflux disease without                            esophagitis CPT copyright 2019 American Medical Association. All rights reserved. The codes documented in this report are preliminary and upon coder review may  be revised to meet current compliance requirements. Henry L. Loletha Carrow, MD 12/21/2018 9:57:13 AM This report has been signed electronically. Number of Addenda: 0

## 2018-12-21 NOTE — Anesthesia Procedure Notes (Signed)
Date/Time: 12/21/2018 9:41 AM Performed by: Talbot Grumbling, CRNA Oxygen Delivery Method: Nasal cannula

## 2018-12-21 NOTE — Discharge Instructions (Signed)
YOU HAD AN ENDOSCOPIC PROCEDURE TODAY: Refer to the procedure report and other information in the discharge instructions given to you for any specific questions about what was found during the examination. If this information does not answer your questions, please call [place] office at [phone] to clarify.  ° °YOU SHOULD EXPECT: Some feelings of bloating in the abdomen. Passage of more gas than usual. Walking can help get rid of the air that was put into your GI tract during the procedure and reduce the bloating. If you had a lower endoscopy (such as a colonoscopy or flexible sigmoidoscopy) you may notice spotting of blood in your stool or on the toilet paper. Some abdominal soreness may be present for a day or two, also. ° °DIET: Your first meal following the procedure should be a light meal and then it is ok to progress to your normal diet. A half-sandwich or bowl of soup is an example of a good first meal. Heavy or fried foods are harder to digest and may make you feel nauseous or bloated. Drink plenty of fluids but you should avoid alcoholic beverages for 24 hours. If you had a esophageal dilation, please see attached instructions for diet.   ° °ACTIVITY: Your care partner should take you home directly after the procedure. You should plan to take it easy, moving slowly for the rest of the day. You can resume normal activity the day after the procedure however YOU SHOULD NOT DRIVE, use power tools, machinery or perform tasks that involve climbing or major physical exertion for 24 hours (because of the sedation medicines used during the test).  ° °SYMPTOMS TO REPORT IMMEDIATELY: °A gastroenterologist can be reached at any hour. Please call [phone]  for any of the following symptoms:  °Following lower endoscopy (colonoscopy, flexible sigmoidoscopy) °Excessive amounts of blood in the stool  °Significant tenderness, worsening of abdominal pains  °Swelling of the abdomen that is new, acute  °Fever of 100° or  higher  °Following upper endoscopy (EGD, EUS, ERCP, esophageal dilation) °Vomiting of blood or coffee ground material  °New, significant abdominal pain  °New, significant chest pain or pain under the shoulder blades  °Painful or persistently difficult swallowing  °New shortness of breath  °Black, tarry-looking or red, bloody stools ° °FOLLOW UP:  °If any biopsies were taken you will be contacted by phone or by letter within the next 1-3 weeks. Call [phone]  if you have not heard about the biopsies in 3 weeks.  °Please also call with any specific questions about appointments or follow up tests. ° °

## 2018-12-21 NOTE — Interval H&P Note (Signed)
History and Physical Interval Note:  12/21/2018 9:28 AM  [name]  has presented today for surgery, with the diagnosis of Vomiting.  The various methods of treatment have been discussed with the patient and family. After consideration of risks, benefits and other options for treatment, the patient has consented to  Procedure(s): ESOPHAGOGASTRODUODENOSCOPY (EGD) WITH PROPOFOL (N/A) as a surgical intervention.  The patient's history has been reviewed, patient examined, no change in status, stable for surgery.  I have reviewed the patient's chart and labs.  Questions were answered to the patient's satisfaction.     [name]

## 2018-12-22 ENCOUNTER — Telehealth: Payer: Self-pay | Admitting: *Deleted

## 2018-12-22 ENCOUNTER — Encounter (HOSPITAL_COMMUNITY): Payer: Self-pay | Admitting: Gastroenterology

## 2018-12-22 NOTE — Telephone Encounter (Signed)
Referral sent to [facility]: Dr. Cindie Laroche for severe GERD, hiatal hernia and morbid obesity

## 2018-12-27 ENCOUNTER — Other Ambulatory Visit: Payer: Self-pay

## 2018-12-27 ENCOUNTER — Ambulatory Visit: Payer: Medicaid Other

## 2018-12-27 DIAGNOSIS — R252 Cramp and spasm: Secondary | ICD-10-CM

## 2018-12-27 DIAGNOSIS — R293 Abnormal posture: Secondary | ICD-10-CM | POA: Diagnosis not present

## 2018-12-27 DIAGNOSIS — M542 Cervicalgia: Secondary | ICD-10-CM

## 2018-12-27 NOTE — Therapy (Signed)
[place], [place], [address] Phone: [phone]   Fax:  [phone]  Physical Therapy Treatment  Patient Details  Name: [name] MRN: [id number] Date of Birth: 1965/10/08 Referring Provider (PT): Lilia Argue, DO   Encounter Date: 12/27/2018  PT End of Session - 12/27/18 1049    Visit Number  4    Number of Visits  10    Date for PT Re-Evaluation  01/16/19    Authorization Type  MCD    Authorization Time Period  8 visits until 01/16/19    Authorization - Visit Number  1    Authorization - Number of Visits  8    PT Start Time  1050    PT Stop Time  1140    PT Time Calculation (min)  50 min    Activity Tolerance  Patient tolerated treatment well;No increased pain    Behavior During Therapy  WFL for tasks assessed/performed       Past Medical History:  Diagnosis Date  . Allergy   . Anemia   . Anxiety   . Arthritis   . Blood transfusion without reported diagnosis   . Cough   . Diverticulitis with perforation 2010  . DVT (deep vein thrombosis) in pregnancy 11/24/2016  . DVT, lower extremity ([place])   . Endometrial cancer ([place])   . Family history of adverse reaction to anesthesia    pts daughter had difficulty awakening following anesthesia, long time to wake up  . GERD (gastroesophageal reflux disease)   . Headache   . History of bronchitis   . History of ear infections   . Hx of migraines   . Hyperlipidemia   . Hypertension   . IBS (irritable bowel syndrome)   . Kidney infection   . Lupus (systemic lupus erythematosus) ([place]) 02/2017  . Morbid obesity ([place])   . Ovarian cancer ([place]) dx'd 01/2015  . PONV (postoperative nausea and vomiting)   . Pyelonephritis   . Scleroderma [facility])     Past Surgical History:  Procedure Laterality Date  . ABDOMINAL HYSTERECTOMY N/A 03/13/2015   Procedure: TOTAL HYSTERECTOMY ABDOMINAL BILATERAL SALPINGO OOPHORECTOMY RADICAL TUMOR [place];  Surgeon:  Everitt Amber, MD;  [facility];  Service: Gynecology;  Laterality: N/A;  . BOWEL RESECTION  03/13/2015   Procedure: SMALL BOWEL RESECTION;  Surgeon: Everitt Amber, MD;  Location: [facility];  Service: Gynecology;;  . [place] and 1984  . COLOSTOMY  2008  . COLOSTOMY TAKEDOWN    . ESOPHAGOGASTRODUODENOSCOPY (EGD) WITH PROPOFOL N/A 12/21/2018   Procedure: ESOPHAGOGASTRODUODENOSCOPY (EGD) WITH PROPOFOL;  Surgeon: Doran Stabler, MD;  [facility];  Service: Gastroenterology;  Laterality: N/A;  . LAPAROTOMY N/A 03/13/2015   Procedure: EXPLORATORY LAPAROTOMY;  Surgeon: Everitt Amber, MD;  [facility];  Service: Gynecology;  Laterality: N/A;  . TONSILLECTOMY AND ADENOIDECTOMY  1997  . VENTRAL HERNIA REPAIR  03/13/2015   Procedure: HERNIA REPAIR VENTRAL ADULT;  Surgeon: Everitt Amber, MD;  [facility];  Service: Gynecology;;    There were no vitals filed for this visit.  Subjective Assessment - 12/27/18 1052    Subjective  Had to ahv a procedure and had to quarentine prior so did not come to PT.   Still with neck pain. 4/10 today.  Wake with pain.   Sleeps on Lt side and feels like its pulling.    How long can you sit comfortably?  l    Pain  Score  4     Pain Location  Neck    Pain Orientation  Right    Pain Descriptors / Indicators  Aching;Tightness    Pain Type  Chronic pain    Pain Onset  More than a month ago    Pain Frequency  Constant    Aggravating Factors   lying    Pain Relieving Factors  heat                       [facility] Adult PT Treatment/Exercise - 12/27/18 0001      Self-Care   Self-Care  --    Other Self-Care Comments   worked on lying posture with use of wedge(suggested she buy one 10-12 inc min) and  towell roll inpillow case and to support head and face  with pillow.       Moist Heat Therapy   Number Minutes Moist Heat  10 Minutes    Moist Heat Location  Cervical   RT     Ultrasound   Ultrasound Location  RT neck to traps     Ultrasound Parameters  100% 1 MHZ 1.6 Wcm2    Ultrasound Goals  Pain      Manual Therapy   Manual therapy comments  contract relax for RT trap and levator.     Soft tissue mobilization  STM for post and anterior shoulder trigger points on RT                  PT Long Term Goals - 12/27/18 1138      PT LONG TERM GOAL #1   Title  "Pt will be independent with advanced HEP.     Baseline  need sprogression    Status  On-going      PT LONG TERM GOAL #2   Title  She will have full neck ROm with 1-2 max pain    Baseline  pain 4/10 today  no PT for 3 weeks    Status  On-going      PT LONG TERM GOAL #3   Title  She will report able to perform normal home tasks with 1-2 max neck pain.    Baseline  moderate as she modifies now taking more breaks. to limit pain increase    Status  On-going      PT LONG TERM GOAL #4   Title  She will report no RT arm pain or tightness    Baseline  no changes    Status  On-going      PT LONG TERM GOAL #5   Title  Pt will be able to sleep on left side at night with 50% greater comfort for more restorative sleep at night    Baseline  can sleep on shoulder but sometimes wakes up sore  but is some better    Status  On-going      PT LONG TERM GOAL #6   Title  Demo good postureal awareness.    Baseline  forward head and rounded shoulders    Status  On-going            Plan - 12/27/18 1134    Clinical Impression Statement  Worked on sleeping postiongs  and she stated she would look into purchase of wedge for home.  no change since last session . Will see for allowed time frame and assess benefit . If not significantly better DN may be option.    PT Treatment/Interventions  Taping;Passive range of motion;Dry needling;Patient/family education;Therapeutic exercise;Manual techniques;Traction;Ultrasound;Moist Heat;Iontophoresis 4mg /ml Dexamethasone    PT Next Visit Plan  Progress HEP  cervical stab level one , continue manual and modalities ask if   towell roll helped    PT Home Exercise Plan  cervical retraction and levator stretch, scapula retraction with ER  and hor abduction green band,    Consulted and Agree with Plan of Care  Patient       Patient will benefit from skilled therapeutic intervention in order to improve the following deficits and impairments:  Obesity, Pain, Decreased activity tolerance, Postural dysfunction, Decreased range of motion  Visit Diagnosis: Cervicalgia  Abnormal posture  Cramp and spasm     Problem List Patient Active Problem List   Diagnosis Date Noted  . Cough 06/11/2018  . Difficulty sleeping 06/11/2018  . Normocytic anemia 05/29/2018  . UTI (urinary tract infection) 05/07/2018  . Scleroderma ([place]) 04/22/2018  . Hemoptysis 04/22/2018  . Swelling of foot joint, right 04/08/2018  . Left shoulder pain 11/11/2017  . Enlarged lymph nodes 08/13/2017  . Cramping of hands 12/31/2016  . Fatigue 11/13/2016  . Positive ANA (antinuclear antibody) 09/23/2016  . Eczema 01/27/2016  . Pulmonary embolism ([place]) 09/12/2015  . Portacath in place 09/12/2015  . Long term current use of anticoagulant therapy 09/06/2015  . Slow rate of speech 08/22/2015  . Chemotherapy-induced peripheral neuropathy ([place]) 06/13/2015  . CKD (chronic kidney disease) stage 3, GFR 30-59 ml/min ([facility]) 05/12/2015  . GERD (gastroesophageal reflux disease) 05/12/2015  . Ovarian carcinosarcoma, right ([place]) 03/29/2015  . Endometrial ca ([place]) 03/29/2015  . Ventral hernia without obstruction or gangrene   . Morbid obesity ([place]) 02/19/2015  . Essential hypertension, benign   . Stress at home 12/14/2014  . Left knee pain 08/04/2013  . Seasonal allergies 07/29/2013  . Right knee pain 11/28/2011    Darrel Hoover  PT 12/27/2018, 11:42 AM  [facility] [address] [place], [place], [address] Phone: [phone]   Fax:  [phone]  Name: [name] MRN:  [id number] Date of Birth: [birth year]

## 2018-12-29 ENCOUNTER — Ambulatory Visit: Payer: Medicaid Other | Admitting: Physical Therapy

## 2018-12-29 ENCOUNTER — Telehealth: Payer: Self-pay | Admitting: *Deleted

## 2018-12-29 ENCOUNTER — Encounter: Payer: Self-pay | Admitting: Physical Therapy

## 2018-12-29 ENCOUNTER — Other Ambulatory Visit: Payer: Self-pay

## 2018-12-29 DIAGNOSIS — R293 Abnormal posture: Secondary | ICD-10-CM | POA: Diagnosis not present

## 2018-12-29 DIAGNOSIS — M25512 Pain in left shoulder: Secondary | ICD-10-CM

## 2018-12-29 DIAGNOSIS — M542 Cervicalgia: Secondary | ICD-10-CM

## 2018-12-29 DIAGNOSIS — R252 Cramp and spasm: Secondary | ICD-10-CM

## 2018-12-29 DIAGNOSIS — G8929 Other chronic pain: Secondary | ICD-10-CM

## 2018-12-29 DIAGNOSIS — M25612 Stiffness of left shoulder, not elsewhere classified: Secondary | ICD-10-CM

## 2018-12-29 NOTE — Telephone Encounter (Signed)
Followed up with [facility] concerning referral. Per Sarah from [place], the patient's insurance does not cover bariatric surgery but she will be scheduled for her hiatal hernia and GERD.

## 2018-12-29 NOTE — Therapy (Signed)
[place], [place], [address] Phone: [phone]   Fax:  [phone]  Physical Therapy Treatment  Patient Details  Name: [name] MRN: [id number] Date of Birth: 11-16-1965 Referring Provider (PT): Lilia Argue, DO   Encounter Date: 12/29/2018  PT End of Session - 12/29/18 1142    Visit Number  5    Number of Visits  10    Date for PT Re-Evaluation  01/16/19    Authorization Type  MCD    Authorization Time Period  8 visits until 01/16/19    Authorization - Visit Number  2    Authorization - Number of Visits  8    PT Start Time  1101    PT Stop Time  1151    PT Time Calculation (min)  50 min       Past Medical History:  Diagnosis Date  . Allergy   . Anemia   . Anxiety   . Arthritis   . Blood transfusion without reported diagnosis   . Cough   . Diverticulitis with perforation 2010  . DVT (deep vein thrombosis) in pregnancy 11/24/2016  . DVT, lower extremity ([place])   . Endometrial cancer ([place])   . Family history of adverse reaction to anesthesia    pts daughter had difficulty awakening following anesthesia, long time to wake up  . GERD (gastroesophageal reflux disease)   . Headache   . History of bronchitis   . History of ear infections   . Hx of migraines   . Hyperlipidemia   . Hypertension   . IBS (irritable bowel syndrome)   . Kidney infection   . Lupus (systemic lupus erythematosus) ([place]) 02/2017  . Morbid obesity ([place])   . Ovarian cancer ([place]) dx'd 01/2015  . PONV (postoperative nausea and vomiting)   . Pyelonephritis   . Scleroderma [facility])     Past Surgical History:  Procedure Laterality Date  . ABDOMINAL HYSTERECTOMY N/A 03/13/2015   Procedure: TOTAL HYSTERECTOMY ABDOMINAL BILATERAL SALPINGO OOPHORECTOMY RADICAL TUMOR [place];  Surgeon: Everitt Amber, MD;  [facility];  Service: Gynecology;  Laterality: N/A;  . BOWEL RESECTION  03/13/2015   Procedure: SMALL BOWEL  RESECTION;  Surgeon: Everitt Amber, MD;  [facility];  Service: Gynecology;;  . [place] and 1984  . COLOSTOMY  2008  . COLOSTOMY TAKEDOWN    . ESOPHAGOGASTRODUODENOSCOPY (EGD) WITH PROPOFOL N/A 12/21/2018   Procedure: ESOPHAGOGASTRODUODENOSCOPY (EGD) WITH PROPOFOL;  Surgeon: Doran Stabler, MD;  [facility];  Service: Gastroenterology;  Laterality: N/A;  . LAPAROTOMY N/A 03/13/2015   Procedure: EXPLORATORY LAPAROTOMY;  Surgeon: Everitt Amber, MD;  [facility];  Service: Gynecology;  Laterality: N/A;  . TONSILLECTOMY AND ADENOIDECTOMY  1997  . VENTRAL HERNIA REPAIR  03/13/2015   Procedure: HERNIA REPAIR VENTRAL ADULT;  Surgeon: Everitt Amber, MD;  [facility];  Service: Gynecology;;    There were no vitals filed for this visit.  Subjective Assessment - 12/29/18 1123    Currently in Pain?  Yes    Pain Score  5     Pain Location  Shoulder    Pain Orientation  Right;Posterior;Distal    Pain Descriptors / Indicators  Sore    Pain Type  Chronic pain                       [facility] Adult PT Treatment/Exercise - 12/29/18 0001      Self-Care  Other Self-Care Comments   Tennis ball in pillow case and demo  and trial of theracane, options of where to purchase provided       Neck Exercises: Seated   Postural Training  Scap retract and chin tucks     Other Seated Exercise  20 reps red ER , horizontal abduction red x 20 standing       Neck Exercises: Supine   Neck Retraction  10 reps    Other Supine Exercise  supine cane pressups and pullovers, protraction    Other Supine Exercise  on wedge and legs oiver bolster horizontal abduction red band x 20 , ER x 20       Moist Heat Therapy   Number Minutes Moist Heat  10 Minutes    Moist Heat Location  Shoulder      Manual Therapy   Soft tissue mobilization  STM right upper trap, periscap      Neck Exercises: Stretches   Upper Trapezius Stretch  Right;Left;2 reps;20 seconds    Levator Stretch   Right;Left;2 reps;20 seconds    Corner Stretch  3 reps   15 seconds, doorway   Other Neck Stretches  cross body stretch              PT Education - 12/29/18 1148    Education Details  HEP    Person(s) Educated  Patient    Methods  Explanation;Handout    Comprehension  Verbalized understanding          PT Long Term Goals - 12/27/18 1138      PT LONG TERM GOAL #1   Title  "Pt will be independent with advanced HEP.     Baseline  need sprogression    Status  On-going      PT LONG TERM GOAL #2   Title  She will have full neck ROm with 1-2 max pain    Baseline  pain 4/10 today  no PT for 3 weeks    Status  On-going      PT LONG TERM GOAL #3   Title  She will report able to perform normal home tasks with 1-2 max neck pain.    Baseline  moderate as she modifies now taking more breaks. to limit pain increase    Status  On-going      PT LONG TERM GOAL #4   Title  She will report no RT arm pain or tightness    Baseline  no changes    Status  On-going      PT LONG TERM GOAL #5   Title  Pt will be able to sleep on left side at night with 50% greater comfort for more restorative sleep at night    Baseline  can sleep on shoulder but sometimes wakes up sore  but is some better    Status  On-going      PT LONG TERM GOAL #6   Title  Demo good postureal awareness.    Baseline  forward head and rounded shoulders    Status  On-going            Plan - 12/29/18 1143    Clinical Impression Statement  Pt is still looking for a wedge for sleep positions. Sh arrives today with 5/10 pain in right periscap and upper trap. Self care provided for self massage using tennis ball at wall and option for purchase of theracane. After use of tennis ball and theracane she reported significant decrease in  pain. Continued with scap and cervical stabilization supine and standing. Good stretch felt with pec stretch in doorway. Manaul performed to right upper trap to decrease tension. HMP applied  post session to further decrease soreness.    PT Next Visit Plan  Progress HEP  cervical stab level one , continue manual and modalities ask if  towell roll helped, did she get wedge or theracane?    PT Home Exercise Plan  cervical retraction and levator stretch, scapula retraction with ER  and hor abduction green band, door way stretch       Patient will benefit from skilled therapeutic intervention in order to improve the following deficits and impairments:  Obesity, Pain, Decreased activity tolerance, Postural dysfunction, Decreased range of motion  Visit Diagnosis: Cervicalgia  Abnormal posture  Cramp and spasm  Chronic left shoulder pain  Stiffness of left shoulder, not elsewhere classified     Problem List Patient Active Problem List   Diagnosis Date Noted  . Cough 06/11/2018  . Difficulty sleeping 06/11/2018  . Normocytic anemia 05/29/2018  . UTI (urinary tract infection) 05/07/2018  . Scleroderma ([place]) 04/22/2018  . Hemoptysis 04/22/2018  . Swelling of foot joint, right 04/08/2018  . Left shoulder pain 11/11/2017  . Enlarged lymph nodes 08/13/2017  . Cramping of hands 12/31/2016  . Fatigue 11/13/2016  . Positive ANA (antinuclear antibody) 09/23/2016  . Eczema 01/27/2016  . Pulmonary embolism ([place]) 09/12/2015  . Portacath in place 09/12/2015  . Long term current use of anticoagulant therapy 09/06/2015  . Slow rate of speech 08/22/2015  . Chemotherapy-induced peripheral neuropathy ([place]) 06/13/2015  . CKD (chronic kidney disease) stage 3, GFR 30-59 ml/min (HCC) 05/12/2015  . GERD (gastroesophageal reflux disease) 05/12/2015  . Ovarian carcinosarcoma, right ([place]) 03/29/2015  . Endometrial ca ([place]) 03/29/2015  . Ventral hernia without obstruction or gangrene   . Morbid obesity ([place]) 02/19/2015  . Essential hypertension, benign   . Stress at home 12/14/2014  . Left knee pain 08/04/2013  . Seasonal allergies 07/29/2013  . Right knee pain 11/28/2011    Dorene Ar, [place] 12/29/2018, 11:49 AM  [facility] [address] [place], [place], [address] Phone: [phone]   Fax:  [phone]  Name: [name] MRN: [id number] Date of Birth: 1965-09-30

## 2019-01-03 ENCOUNTER — Ambulatory Visit: Payer: Medicaid Other

## 2019-01-03 ENCOUNTER — Other Ambulatory Visit: Payer: Self-pay

## 2019-01-03 DIAGNOSIS — R252 Cramp and spasm: Secondary | ICD-10-CM

## 2019-01-03 DIAGNOSIS — R293 Abnormal posture: Secondary | ICD-10-CM

## 2019-01-03 DIAGNOSIS — G8929 Other chronic pain: Secondary | ICD-10-CM

## 2019-01-03 DIAGNOSIS — M542 Cervicalgia: Secondary | ICD-10-CM

## 2019-01-03 NOTE — Therapy (Signed)
[place], [place], [address] Phone: [phone]   Fax:  [phone]  Physical Therapy Treatment  Patient Details  Name: [name] MRN: [id number] Date of Birth: 1965-08-18 Referring Provider (PT): Lilia Argue, DO   Encounter Date: 01/03/2019  PT End of Session - 01/03/19 1053    Visit Number  6    Number of Visits  10    Date for PT Re-Evaluation  01/16/19    Authorization Type  MCD    Authorization Time Period  8 visits until 01/16/19    Authorization - Visit Number  3    Authorization - Number of Visits  8    PT Start Time  1050    PT Stop Time  1135    PT Time Calculation (min)  45 min    Activity Tolerance  Patient tolerated treatment well;No increased pain    Behavior During Therapy  WFL for tasks assessed/performed       Past Medical History:  Diagnosis Date  . Allergy   . Anemia   . Anxiety   . Arthritis   . Blood transfusion without reported diagnosis   . Cough   . Diverticulitis with perforation 2010  . DVT (deep vein thrombosis) in pregnancy 11/24/2016  . DVT, lower extremity ([place])   . Endometrial cancer ([place])   . Family history of adverse reaction to anesthesia    pts daughter had difficulty awakening following anesthesia, long time to wake up  . GERD (gastroesophageal reflux disease)   . Headache   . History of bronchitis   . History of ear infections   . Hx of migraines   . Hyperlipidemia   . Hypertension   . IBS (irritable bowel syndrome)   . Kidney infection   . Lupus (systemic lupus erythematosus) ([place]) 02/2017  . Morbid obesity ([place])   . Ovarian cancer ([place]) dx'd 01/2015  . PONV (postoperative nausea and vomiting)   . Pyelonephritis   . Scleroderma [facility])     Past Surgical History:  Procedure Laterality Date  . ABDOMINAL HYSTERECTOMY N/A 03/13/2015   Procedure: TOTAL HYSTERECTOMY ABDOMINAL BILATERAL SALPINGO OOPHORECTOMY RADICAL TUMOR [place];  Surgeon:  Everitt Amber, MD;  [facility];  Service: Gynecology;  Laterality: N/A;  . BOWEL RESECTION  03/13/2015   Procedure: SMALL BOWEL RESECTION;  Surgeon: Everitt Amber, MD;  Location: [facility];  Service: Gynecology;;  . [place] and 1984  . COLOSTOMY  2008  . COLOSTOMY TAKEDOWN    . ESOPHAGOGASTRODUODENOSCOPY (EGD) WITH PROPOFOL N/A 12/21/2018   Procedure: ESOPHAGOGASTRODUODENOSCOPY (EGD) WITH PROPOFOL;  Surgeon: Doran Stabler, MD;  [facility];  Service: Gastroenterology;  Laterality: N/A;  . LAPAROTOMY N/A 03/13/2015   Procedure: EXPLORATORY LAPAROTOMY;  Surgeon: Everitt Amber, MD;  [facility];  Service: Gynecology;  Laterality: N/A;  . TONSILLECTOMY AND ADENOIDECTOMY  1997  . VENTRAL HERNIA REPAIR  03/13/2015   Procedure: HERNIA REPAIR VENTRAL ADULT;  Surgeon: Everitt Amber, MD;  [facility];  Service: Gynecology;;    There were no vitals filed for this visit.  Subjective Assessment - 01/03/19 1048    Subjective  3/10 today. Feel better. Did sleep with wedge borrowed from a friend.  Used heat pad Saturday/    Currently in Pain?  Yes    Pain Score  3     Pain Location  Shoulder    Pain Orientation  Right;Posterior    Pain Descriptors / Indicators  Sore    Pain Type  Chronic pain    Pain Onset  More than a month ago    Pain Frequency  Constant    Aggravating Factors   lying    Pain Relieving Factors  heat                       [facility] Adult PT Treatment/Exercise - 01/03/19 0001      Neck Exercises: Standing   Other Standing Exercises  with good posture row and shoulder est green band x 20 each bilaterally      Neck Exercises: Seated   Postural Training  Scap retract and chin tucks     Other Seated Exercise  stretches of levator and trap x 2 30 sec manual and verbal cue for technique and hol time. pec stretch  x 2 30 sec cue for head posture.       Modalities   Modalities  Traction      Moist Heat Therapy   Number Minutes Moist Heat   10 Minutes    Moist Heat Location  Cervical;Shoulder      Traction   Type of Traction  Cervical    Min (lbs)  5    Max (lbs)  14    Hold Time  60    Rest Time  15    Time  15      Neck Exercises: Stretches   Other Neck Stretches  cross body stretch                   PT Long Term Goals - 01/03/19 1129      PT LONG TERM GOAL #1   Title  "Pt will be independent with advanced HEP.     Baseline  need sprogression    Status  On-going      PT LONG TERM GOAL #2   Baseline  3/10 today    Status  On-going      PT LONG TERM GOAL #3   Title  She will report able to perform normal home tasks with 1-2 max neck pain.    Baseline  mild to moderate but modifies time    Status  On-going      PT LONG TERM GOAL #4   Title  She will report no RT arm pain or tightness    Baseline  varies with intensity    Status  On-going      PT LONG TERM GOAL #5   Title  Pt will be able to sleep on left side at night with 50% greater comfort for more restorative sleep at night    Baseline  improved with wedge and towel support.    Status  On-going      PT LONG TERM GOAL #6   Title  Demo good postureal awareness.    Status  Achieved            Plan - 01/03/19 1053    Clinical Impression Statement  Improving but still with pain. She is able to do the HEP with minor cuing. She appears to understand good posture and need to contiuue HEP. Mechanical traction appeared to ease pain so will try again next session.    PT Treatment/Interventions  Taping;Passive range of motion;Dry needling;Patient/family education;Therapeutic exercise;Manual techniques;Traction;Ultrasound;Moist Heat;Iontophoresis 4mg /ml Dexamethasone    PT Next Visit Plan  Progress HEP  cervical stab level one , continue manual and modalities, traction if ok after session and incr  to 15-16 if appears to be appropriate    PT Home Exercise Plan  cervical retraction and levator stretch, scapula retraction with ER  and hor abduction  green band, door way stretch    Consulted and Agree with Plan of Care  Patient       Patient will benefit from skilled therapeutic intervention in order to improve the following deficits and impairments:  Obesity, Pain, Decreased activity tolerance, Postural dysfunction, Decreased range of motion  Visit Diagnosis: Cervicalgia  Abnormal posture  Cramp and spasm  Chronic right shoulder pain     Problem List Patient Active Problem List   Diagnosis Date Noted  . Cough 06/11/2018  . Difficulty sleeping 06/11/2018  . Normocytic anemia 05/29/2018  . UTI (urinary tract infection) 05/07/2018  . Scleroderma ([place]) 04/22/2018  . Hemoptysis 04/22/2018  . Swelling of foot joint, right 04/08/2018  . Left shoulder pain 11/11/2017  . Enlarged lymph nodes 08/13/2017  . Cramping of hands 12/31/2016  . Fatigue 11/13/2016  . Positive ANA (antinuclear antibody) 09/23/2016  . Eczema 01/27/2016  . Pulmonary embolism ([place]) 09/12/2015  . Portacath in place 09/12/2015  . Long term current use of anticoagulant therapy 09/06/2015  . Slow rate of speech 08/22/2015  . Chemotherapy-induced peripheral neuropathy ([place]) 06/13/2015  . CKD (chronic kidney disease) stage 3, GFR 30-59 ml/min (HCC) 05/12/2015  . GERD (gastroesophageal reflux disease) 05/12/2015  . Ovarian carcinosarcoma, right ([place]) 03/29/2015  . Endometrial ca ([place]) 03/29/2015  . Ventral hernia without obstruction or gangrene   . Morbid obesity ([place]) 02/19/2015  . Essential hypertension, benign   . Stress at home 12/14/2014  . Left knee pain 08/04/2013  . Seasonal allergies 07/29/2013  . Right knee pain 11/28/2011    Darrel Hoover  PT 01/03/2019, 11:31 AM  [facility] [address] [place], [place], [address] Phone: [phone]   Fax:  [phone]  Name: [name] MRN: [id number] Date of Birth: 1966-01-14

## 2019-01-05 ENCOUNTER — Ambulatory Visit: Payer: Medicaid Other | Admitting: Physical Therapy

## 2019-01-10 ENCOUNTER — Other Ambulatory Visit: Payer: Self-pay

## 2019-01-10 ENCOUNTER — Ambulatory Visit: Payer: Medicaid Other | Attending: Sports Medicine

## 2019-01-10 DIAGNOSIS — M25511 Pain in right shoulder: Secondary | ICD-10-CM | POA: Insufficient documentation

## 2019-01-10 DIAGNOSIS — G8929 Other chronic pain: Secondary | ICD-10-CM | POA: Diagnosis present

## 2019-01-10 DIAGNOSIS — R293 Abnormal posture: Secondary | ICD-10-CM | POA: Insufficient documentation

## 2019-01-10 DIAGNOSIS — R252 Cramp and spasm: Secondary | ICD-10-CM | POA: Diagnosis present

## 2019-01-10 DIAGNOSIS — M542 Cervicalgia: Secondary | ICD-10-CM | POA: Diagnosis present

## 2019-01-10 NOTE — Therapy (Signed)
[place], [place], [address] Phone: [phone]   Fax:  [phone]  Physical Therapy Treatment  Patient Details  Name: [name] MRN: [id number] Date of Birth: 03/01/1966 Referring Provider (PT): Lilia Argue, DO   Encounter Date: 01/10/2019  PT End of Session - 01/10/19 1103    Visit Number  7    Number of Visits  10    Date for PT Re-Evaluation  01/16/19    Authorization Type  MCD    Authorization Time Period  8 visits until 01/16/19    Authorization - Visit Number  4    Authorization - Number of Visits  8    PT Start Time  [id number]    PT Stop Time  1135    PT Time Calculation (min)  50 min    Activity Tolerance  Patient tolerated treatment well;No increased pain    Behavior During Therapy  WFL for tasks assessed/performed       Past Medical History:  Diagnosis Date  . Allergy   . Anemia   . Anxiety   . Arthritis   . Blood transfusion without reported diagnosis   . Cough   . Diverticulitis with perforation 2010  . DVT (deep vein thrombosis) in pregnancy 11/24/2016  . DVT, lower extremity ([place])   . Endometrial cancer ([place])   . Family history of adverse reaction to anesthesia    pts daughter had difficulty awakening following anesthesia, long time to wake up  . GERD (gastroesophageal reflux disease)   . Headache   . History of bronchitis   . History of ear infections   . Hx of migraines   . Hyperlipidemia   . Hypertension   . IBS (irritable bowel syndrome)   . Kidney infection   . Lupus (systemic lupus erythematosus) ([place]) 02/2017  . Morbid obesity ([place])   . Ovarian cancer ([place]) dx'd 01/2015  . PONV (postoperative nausea and vomiting)   . Pyelonephritis   . Scleroderma [facility])     Past Surgical History:  Procedure Laterality Date  . ABDOMINAL HYSTERECTOMY N/A 03/13/2015   Procedure: TOTAL HYSTERECTOMY ABDOMINAL BILATERAL SALPINGO OOPHORECTOMY RADICAL TUMOR [place];  Surgeon:  Everitt Amber, MD;  [facility];  Service: Gynecology;  Laterality: N/A;  . BOWEL RESECTION  03/13/2015   Procedure: SMALL BOWEL RESECTION;  Surgeon: Everitt Amber, MD;  [facility];  Service: Gynecology;;  . [place] and 1984  . COLOSTOMY  2008  . COLOSTOMY TAKEDOWN    . ESOPHAGOGASTRODUODENOSCOPY (EGD) WITH PROPOFOL N/A 12/21/2018   Procedure: ESOPHAGOGASTRODUODENOSCOPY (EGD) WITH PROPOFOL;  Surgeon: Doran Stabler, MD;  [facility];  Service: Gastroenterology;  Laterality: N/A;  . LAPAROTOMY N/A 03/13/2015   Procedure: EXPLORATORY LAPAROTOMY;  Surgeon: Everitt Amber, MD;  [facility];  Service: Gynecology;  Laterality: N/A;  . TONSILLECTOMY AND ADENOIDECTOMY  1997  . VENTRAL HERNIA REPAIR  03/13/2015   Procedure: HERNIA REPAIR VENTRAL ADULT;  Surgeon: Everitt Amber, MD;  [facility];  Service: Gynecology;;    There were no vitals filed for this visit.  Subjective Assessment - 01/10/19 1051    Subjective  Doing some better . MD  this thursday/.   May get xray.   See [facility]         [facility] PT Assessment - 01/10/19 0001      Assessment   Medical Diagnosis  cervicalgia    Referring Provider (PT)  Lilia Argue, DO  AROM   AROM Assessment Site  Shoulder    Right/Left Shoulder  Right;Left    Right Shoulder Flexion  100 Degrees    Right Shoulder ABduction  120 Degrees    Right Shoulder External Rotation  65 Degrees    Right Shoulder Horizontal  ADduction  100 Degrees    Left Shoulder Flexion  100 Degrees    Left Shoulder ABduction  120 Degrees    Left Shoulder External Rotation  65 Degrees    Left Shoulder Horizontal ADduction  100 Degrees    Cervical Flexion  60    Cervical Extension  50    Cervical - Right Side Bend  45    Cervical - Left Side Bend  40    Cervical - Right Rotation  55    Cervical - Left Rotation  55                   [facility] Adult PT Treatment/Exercise - 01/10/19 0001      Exercises   Exercises  Shoulder       Neck Exercises: Seated   Neck Retraction  5 reps;3 secs    Neck Retraction Limitations  with red band pull with ER and scap retraction      Shoulder Exercises: Standing   Extension  15 reps;Both    Theraband Level (Shoulder Extension)  Level 2 (Red)    Row  Both;15 reps    Theraband Level (Shoulder Row)  Level 2 (Red)      Shoulder Exercises: Stretch   Cross Chest Stretch  2 reps;20 seconds    Cross Chest Stretch Limitations  RT/LT    Other Shoulder Stretches  Active ER and abdution x 5 with  assist end range stretch      Moist Heat Therapy   Number Minutes Moist Heat  10 Minutes    Moist Heat Location  Cervical;Shoulder      Manual Therapy   Soft tissue mobilization  STM right upper trap, periscap                  PT Long Term Goals - 01/10/19 1053      PT LONG TERM GOAL #1   Title  "Pt will be independent with advanced HEP.     Baseline  doing all HEP corectly    Status  Achieved      PT LONG TERM GOAL #2   Baseline  3/10 today   eases most times later in the day    Status  On-going      PT LONG TERM GOAL #3   Title  She will report able to perform normal home tasks with 1-2 max neck pain.    Baseline  does normal activity at home.  Last clean the past Saturday  pain 5/10 post    Status  On-going      PT LONG TERM GOAL #4   Title  She will report no RT arm pain or tightness    Baseline  varies but has RT shoulder pain now    Status  On-going      PT LONG TERM GOAL #5   Title  Pt will be able to sleep on RT  side at night with 50% greater comfort for more restorative sleep at night    Baseline  improved with wedge and towel support.  doesn't sleep on RT side    Status  Revised      PT LONG TERM GOAL #6  Title  Demo good postureal awareness.    Status  Achieved            Plan - 01/10/19 1049    Clinical Impression Statement  No significant change. She reported at end that cervical traction made her feel better for the day it was done. We  will resume that . She will see Dr Micheline Chapman this week and she will ask for xray.   If she continues past next week will have her see PT that does dry needling.    PT Treatment/Interventions  Taping;Passive range of motion;Dry needling;Patient/family education;Therapeutic exercise;Manual techniques;Traction;Ultrasound;Moist Heat;Iontophoresis 4mg /ml Dexamethasone    PT Next Visit Plan  Progress HEP  cervical stab level one , continue manual and modalities, traction if ok after session and incr to 15-16 if appears to be appropriate    PT Home Exercise Plan  cervical retraction and levator stretch, scapula retraction with ER  and hor abduction green band, door way stretch    Consulted and Agree with Plan of Care  Patient       Patient will benefit from skilled therapeutic intervention in order to improve the following deficits and impairments:  Obesity, Pain, Decreased activity tolerance, Postural dysfunction, Decreased range of motion  Visit Diagnosis: Cervicalgia  Abnormal posture  Cramp and spasm  Chronic right shoulder pain     Problem List Patient Active Problem List   Diagnosis Date Noted  . Cough 06/11/2018  . Difficulty sleeping 06/11/2018  . Normocytic anemia 05/29/2018  . UTI (urinary tract infection) 05/07/2018  . Scleroderma ([place]) 04/22/2018  . Hemoptysis 04/22/2018  . Swelling of foot joint, right 04/08/2018  . Left shoulder pain 11/11/2017  . Enlarged lymph nodes 08/13/2017  . Cramping of hands 12/31/2016  . Fatigue 11/13/2016  . Positive ANA (antinuclear antibody) 09/23/2016  . Eczema 01/27/2016  . Pulmonary embolism ([place]) 09/12/2015  . Portacath in place 09/12/2015  . Long term current use of anticoagulant therapy 09/06/2015  . Slow rate of speech 08/22/2015  . Chemotherapy-induced peripheral neuropathy ([place]) 06/13/2015  . CKD (chronic kidney disease) stage 3, GFR 30-59 ml/min 05/12/2015  . GERD (gastroesophageal reflux disease) 05/12/2015  . 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 Endometrial ca ([place]) 03/29/2015  . Ventral hernia without obstruction or gangrene   . Morbid obesity ([place]) 02/19/2015  . Essential hypertension, benign   . Stress at home 12/14/2014  . Left knee pain 08/04/2013  . Seasonal allergies 07/29/2013  . Right knee pain 11/28/2011    Darrel Hoover  PT 01/10/2019, 11:32 AM  [facility] [address] [place], [place], [address] Phone: [phone]   Fax:  [phone]  Name: [name] MRN: [id number] Date of Birth: 1965/11/08

## 2019-01-11 ENCOUNTER — Ambulatory Visit: Payer: Medicaid Other | Admitting: Sports Medicine

## 2019-01-12 ENCOUNTER — Encounter: Payer: Medicaid Other | Admitting: Physical Therapy

## 2019-01-13 ENCOUNTER — Other Ambulatory Visit: Payer: Self-pay

## 2019-01-13 ENCOUNTER — Ambulatory Visit
Admission: RE | Admit: 2019-01-13 | Discharge: 2019-01-13 | Disposition: A | Discharge status: Home / Self Care | Payer: Medicaid Other | Source: Ambulatory Visit | Attending: Sports Medicine | Admitting: Sports Medicine

## 2019-01-13 ENCOUNTER — Ambulatory Visit: Payer: Medicaid Other

## 2019-01-13 ENCOUNTER — Telehealth: Payer: Self-pay | Admitting: Gastroenterology

## 2019-01-13 ENCOUNTER — Other Ambulatory Visit: Payer: Self-pay | Admitting: Family Medicine

## 2019-01-13 ENCOUNTER — Ambulatory Visit: Payer: Medicaid Other | Admitting: Sports Medicine

## 2019-01-13 VITALS — BP 146/74 | Ht 65.5 in | Wt 380.0 lb

## 2019-01-13 DIAGNOSIS — M542 Cervicalgia: Secondary | ICD-10-CM | POA: Diagnosis present

## 2019-01-13 DIAGNOSIS — Z1231 Encounter for screening mammogram for malignant neoplasm of breast: Secondary | ICD-10-CM

## 2019-01-13 DIAGNOSIS — R252 Cramp and spasm: Secondary | ICD-10-CM

## 2019-01-13 DIAGNOSIS — M7712 Lateral epicondylitis, left elbow: Secondary | ICD-10-CM | POA: Diagnosis not present

## 2019-01-13 DIAGNOSIS — R293 Abnormal posture: Secondary | ICD-10-CM

## 2019-01-13 HISTORY — DX: Cervicalgia: M54.2

## 2019-01-13 NOTE — Telephone Encounter (Signed)
Dr. Loletha Carrow, I called and spoke with the patient who reported she only consulted with the Bariatric surgeon about Bariatric surgery even though the referral made clear the patient was being referred for severe GERD and the hiatal hernia repair as well. The patient stated that once [facility] found out bariatric surgery was not covered, she was told to come back to [facility] and she states that she was not spoken to about her GERD or hernia. I will request the office notes from [place]. The patient reported she would call [facility] back to reschedule another consult and speak this time about her GERD and hernia repair.  -------- Update: After calling [facility] to obtain records, I was told the patient was informed that Medicaid does not cover Bariatric surgery and to return the call in order to be schedule for her GERD and hiatal hernia. I think the patient misunderstood the conversation with [facility]. Left a message for the patient to return the call from [facility] to be scheduled.

## 2019-01-13 NOTE — Telephone Encounter (Signed)
Spoke to the patient. Answered all questions. The patient has called [facility] and is awaiting a call back to schedule a consultation.

## 2019-01-13 NOTE — Therapy (Addendum)
[place], [place], [address] Phone: [phone]   Fax:  [phone]  Physical Therapy Treatment/Discharge  Patient Details  Name: [name] MRN: [id number] Date of Birth: 1965/07/08 Referring Provider (PT): Lilia Argue, DO   Encounter Date: 01/13/2019  PT End of Session - 01/13/19 1104    Visit Number  8    Number of Visits  10    Date for PT Re-Evaluation  01/16/19    Behavior During Therapy  [facility] for tasks assessed/performed       Past Medical History:  Diagnosis Date  . Allergy   . Anemia   . Anxiety   . Arthritis   . Blood transfusion without reported diagnosis   . Cough   . Diverticulitis with perforation 2010  . DVT (deep vein thrombosis) in pregnancy 11/24/2016  . DVT, lower extremity ([place])   . Endometrial cancer ([place])   . Family history of adverse reaction to anesthesia    pts daughter had difficulty awakening following anesthesia, long time to wake up  . GERD (gastroesophageal reflux disease)   . Headache   . History of bronchitis   . History of ear infections   . Hx of migraines   . Hyperlipidemia   . Hypertension   . IBS (irritable bowel syndrome)   . Kidney infection   . Lupus (systemic lupus erythematosus) ([place]) 02/2017  . Morbid obesity ([place])   . Ovarian cancer ([place]) dx'd 01/2015  . PONV (postoperative nausea and vomiting)   . Pyelonephritis   . Scleroderma [facility])     Past Surgical History:  Procedure Laterality Date  . ABDOMINAL HYSTERECTOMY N/A 03/13/2015   Procedure: TOTAL HYSTERECTOMY ABDOMINAL BILATERAL SALPINGO OOPHORECTOMY RADICAL TUMOR [place];  Surgeon: Everitt Amber, MD;  Location: [facility];  Service: Gynecology;  Laterality: N/A;  . BOWEL RESECTION  03/13/2015   Procedure: SMALL BOWEL RESECTION;  Surgeon: Everitt Amber, MD;  Location: [facility];  Service: Gynecology;;  . [place] and 1984  . COLOSTOMY  2008  . COLOSTOMY TAKEDOWN    .  ESOPHAGOGASTRODUODENOSCOPY (EGD) WITH PROPOFOL N/A 12/21/2018   Procedure: ESOPHAGOGASTRODUODENOSCOPY (EGD) WITH PROPOFOL;  Surgeon: Doran Stabler, MD;  Location: [facility];  Service: Gastroenterology;  Laterality: N/A;  . LAPAROTOMY N/A 03/13/2015   Procedure: EXPLORATORY LAPAROTOMY;  Surgeon: Everitt Amber, MD;  Location: [facility];  Service: Gynecology;  Laterality: N/A;  . TONSILLECTOMY AND ADENOIDECTOMY  1997  . VENTRAL HERNIA REPAIR  03/13/2015   Procedure: HERNIA REPAIR VENTRAL ADULT;  Surgeon: Everitt Amber, MD;  Location: [facility];  Service: Gynecology;;    There were no vitals filed for this visit.  Subjective Assessment - 01/13/19 1052    Subjective  DO ordered Xray .   Will stop PT until post imaging  per PT and she is at the end of her authorization period as treatments have not lasted more than 2 days. she reports she streched at the doctors office today    Pain Score  3     Pain Location  Shoulder    Pain Orientation  Right;Posterior    Pain Descriptors / Indicators  Sore    Pain Type  Chronic pain    Pain Onset  More than a month ago    Pain Frequency  Constant    Aggravating Factors   lying    Pain Relieving Factors  heat  PT Long Term Goals - 01/10/19 1053      PT LONG TERM GOAL #1   Title  "Pt will be independent with advanced HEP.     Baseline  doing all HEP corectly    Status  Achieved      PT LONG TERM GOAL #2   Baseline  3/10 today   eases most times later in the day    Status  On-going      PT LONG TERM GOAL #3   Title  She will report able to perform normal home tasks with 1-2 max neck pain.    Baseline  does normal activity at home.  Last clean the past Saturday  pain 5/10 post    Status  On-going      PT LONG TERM GOAL #4   Title  She will report no RT arm pain or tightness    Baseline  varies but has RT shoulder pain now    Status  On-going      PT LONG TERM GOAL #5   Title  Pt will be  able to sleep on RT  side at night with 50% greater comfort for more restorative sleep at night    Baseline  improved with wedge and towel support.  doesn't sleep on RT side    Status  Revised      PT LONG TERM GOAL #6   Title  Demo good postureal awareness.    Status  Achieved            Plan - 01/13/19 1048    Clinical Impression Statement  [name] saw Dr Micheline Chapman todayand she will have a cervical xray today. She reported no long term benefits from PT. As she is at the end of her auth period will hold until after imaging and if Dr Micheline Chapman feels she would benefit from more PT we wil reassess and aske for PT extension. During that POC we will do traction and dry needling  as these have not been done  this EOC except one time with traction.    PT Treatment/Interventions  Taping;Passive range of motion;Dry needling;Patient/family education;Therapeutic exercise;Manual techniques;Traction;Ultrasound;Moist Heat;Iontophoresis 41m/ml Dexamethasone    PT Next Visit Plan  As she is out of auth perios after today we will put her on hold untila after imaging and if Dr DMicheline Chapmanfeels P needed will reassess and extend PT with traction and  DN along with exer and manual    PT Home Exercise Plan  cervical retraction and levator stretch, scapula retraction with ER  and hor abduction green band, door way stretch    Consulted and Agree with Plan of Care  Patient       Patient will benefit from skilled therapeutic intervention in order to improve the following deficits and impairments:  Obesity, Pain, Decreased activity tolerance, Postural dysfunction, Decreased range of motion  Visit Diagnosis: Cervicalgia  Abnormal posture  Cramp and spasm     Problem List Patient Active Problem List   Diagnosis Date Noted  . Neck pain 01/13/2019  . Lateral epicondylitis of left elbow 01/13/2019  . Cough 06/11/2018  . Difficulty sleeping 06/11/2018  . Normocytic anemia 05/29/2018  . UTI (urinary tract  infection) 05/07/2018  . Scleroderma ([place] 04/22/2018  . Hemoptysis 04/22/2018  . Swelling of foot joint, right 04/08/2018  . Left shoulder pain 11/11/2017  . Enlarged lymph nodes 08/13/2017  . Cramping of hands 12/31/2016  . Fatigue 11/13/2016  . Positive ANA (antinuclear antibody) 09/23/2016  .  Eczema 01/27/2016  . Pulmonary embolism ([place]) 09/12/2015  . Portacath in place 09/12/2015  . Long term current use of anticoagulant therapy 09/06/2015  . Slow rate of speech 08/22/2015  . Chemotherapy-induced peripheral neuropathy ([place]) 06/13/2015  . CKD (chronic kidney disease) stage 3, GFR 30-59 ml/min 05/12/2015  . GERD (gastroesophageal reflux disease) 05/12/2015  . Ovarian carcinosarcoma, right ([place]) 03/29/2015  . Endometrial ca ([place]) 03/29/2015  . Ventral hernia without obstruction or gangrene   . Morbid obesity ([place]) 02/19/2015  . Essential hypertension, benign   . Stress at home 12/14/2014  . Left knee pain 08/04/2013  . Seasonal allergies 07/29/2013  . Right knee pain 11/28/2011    Darrel Hoover  PT 01/13/2019, 11:11 AM  [facility] [address] [place], [place], [address] Phone: [phone]   Fax:  [phone]  Name: [name] MRN: [id number] Date of Birth: 1965/04/25  PHYSICAL THERAPY DISCHARGE SUMMARY  Visits from Start of Care:8  Current functional level related to goals / functional outcomes: Unknown she did not return after this visit  Remaining deficits: Unknown   Education / Equipment: HEP  Plan:                                                    Patient goals were not met. Patient is being discharged due to not returning since the last visit.  ?????    Pearson Forster PT   02/21/19

## 2019-01-13 NOTE — Progress Notes (Signed)
[name] is a 53 y.o. female who presents to [facility] today for the following:  F/u cervicalgia Has been doing PT, about 7-8 sessions.  Helps for a few days then feels like she is back to where she started. PT notes show improvement with traction Does not that pain is not as severe as it was when started. Does not radiate past the elbow. Worsens when she wakes up from sleeping. Takes a few hours to get rid of stiffness and ache. Taking a hot shower and using a muscle rub helps. Tried to get a new pillow to help while she is sleeping. Pain not as severe after house work as when it first started. "Soreness in right neck to back to shoulder is just always there." Using heating pad during the day with improvement. Only takes robaxin when in severe pain, but has some improvement. No new numbness or tingling, has hx neuropathy  Left elbow pain Thinks she is favoring right arm more 2 days of pain in left elbow Worsens at the end of the day after doing chores and feels a soreness on the outside of her elbow   PMH reviewed. Hx HTN, PE, GERD, CKD III ROS as above. Medications reviewed.  Exam:  BP (!) 146/74   Ht 5' 5.5" (1.664 m)   Wt (!) 380 lb (172.4 kg)   LMP 10/26/2013   BMI 62.27 kg/m  Gen: Well NAD  Right Shoulder: Inspection reveals no obvious deformity, atrophy, or asymmetry. No bruising. No swelling TTP pressure point in trapezius medial to scapula at level of T6, TTP AC joint, [place] joint, diffusely humeral head Decreased flexion and abduction but 5-10 degrees compared to left NV intact distally, sensation intact throughout BUE 1+ biceps, triceps, brachioradialis reflexes b/l Normal scapular function observed. Special Tests:  - Impingement: Neg [place] - Supraspinatous: Positive empty can.  4/5 strength with resisted flexion at 20 degrees - Infraspinatous/[place]: 4/5 strength with ER - Subscapularis: 4/5 strength with IR - Biceps tendon: Negative Speeds -  Labrum: Positive Obri[place] - AC Joint: Negative cross arm - No drop arm sign  Left Elbow No gross deformity, swelling, or erythema noted, TTP lateral epicondyle, 4/5 strength and pain with wrist extension, 5/5 strength and no pain with forearm supination and pronation  Neck No gross deformity, swelling, or erythema.  Negative straight leg raise seated.   No results found.   Assessment and Plan: 1) Neck pain Symptoms consistent with cervicalgia.  Dx supported by improvement with traction at PT and pan-positive exam.  Will obtain cervical spine 2-view XR first to assess joint space and will likely order MRI after imaging.  Patient will be contacted with results and will set up follow up following imaging.  Advised that she can continue PT if she would like in the meantime.  Can continue robaxin prn for pain.    Lateral epicondylitis of left elbow Likely 2/2 overuse in setting of pain in right arm.  Will continue with treatment of cervical pain, which will likely decrease overuse of left arm and improve symptoms.    [place] Constable, D.O.  PGY-2 Family Medicine  01/13/2019 10:57 AM  Patient seen and evaluated with the resident.  I agree with the above plan of care.  Patient notices considerable improvement 1 to 2 days after physical therapy.  She notes improvement after cervical traction.  Although she does have some pain diffusely throughout the shoulder with rotator cuff examination I suspect most of her pain is coming  from her cervical spine.  I would like to get an x-ray of her cervical spine and will determine the need for MRI based on those findings.  Phone follow-up with those results when available.

## 2019-01-13 NOTE — Telephone Encounter (Signed)
Pt called to inform that her insurance does not cover bariatric surgery, they told her that they could do other tests but they have not called her yet to schedule and appt and she wants to know if Dr. Loletha Carrow is in agreement with other proposed evaluation/tests.

## 2019-01-13 NOTE — Assessment & Plan Note (Signed)
Symptoms consistent with cervicalgia.  Dx supported by improvement with traction at PT and pan-positive exam.  Will obtain cervical spine 2-view XR first to assess joint space and will likely order MRI after imaging.  Patient will be contacted with results and will set up follow up following imaging.  Advised that she can continue PT if she would like in the meantime.  Can continue robaxin prn for pain.

## 2019-01-13 NOTE — Assessment & Plan Note (Signed)
Likely 2/2 overuse in setting of pain in right arm.  Will continue with treatment of cervical pain, which will likely decrease overuse of left arm and improve symptoms.

## 2019-01-13 NOTE — Telephone Encounter (Signed)
I am afraid I do not know how to comment on that, since I do not know what tests are being ordered, why and by whom.  My recommendation was to see the surgeon to discuss surgical options to improve her severe reflux such as repair of the hiatal hernia, bariatric surgery or both.  If she has seen the surgeon, then please obtain the note because I have not yet received it.

## 2019-01-13 NOTE — Telephone Encounter (Signed)
Dr. Loletha Carrow, please see note below:

## 2019-01-13 NOTE — Telephone Encounter (Signed)
Thank you for looking into it.  Yes it seems that a more comprehensive consideration of surgical options was in order.  Best thing would be for me to review the surgeon's note when we have it, and then I will contact that provider directly.

## 2019-01-17 ENCOUNTER — Ambulatory Visit: Payer: Medicaid Other | Admitting: Physical Therapy

## 2019-01-24 ENCOUNTER — Telehealth: Payer: Self-pay

## 2019-01-24 ENCOUNTER — Telehealth: Payer: Self-pay | Admitting: Sports Medicine

## 2019-01-24 NOTE — Telephone Encounter (Signed)
Per Dr. Micheline Chapman I let her know that her x-ray shows some degenerative disc disease at C5-C6. Next step would be to proceed with an MRI if she is still having problems. Pt would like to hold off on getting the MRI for now as her pain isn't as severe as it was. She will call and let us know if she wants to get an MRI in the future.

## 2019-01-24 NOTE — Telephone Encounter (Signed)
Patient notified via telephone of x-ray results.  X-rays of her cervical spine show moderate disc space narrowing at C5-C6.  I would recommend an MRI to rule out cervical radiculopathy but she would like to wait on that for now.  She will reconsider if symptoms worsen.

## 2019-02-01 ENCOUNTER — Ambulatory Visit
Admission: RE | Admit: 2019-02-01 | Discharge: 2019-02-01 | Disposition: A | Discharge status: Home / Self Care | Payer: Medicaid Other | Source: Ambulatory Visit | Attending: Family Medicine | Admitting: Family Medicine

## 2019-02-01 ENCOUNTER — Ambulatory Visit (INDEPENDENT_AMBULATORY_CARE_PROVIDER_SITE_OTHER): Payer: Medicaid Other | Admitting: Family Medicine

## 2019-02-01 ENCOUNTER — Other Ambulatory Visit: Payer: Self-pay

## 2019-02-01 VITALS — BP 140/70 | HR 71 | Wt 390.8 lb

## 2019-02-01 DIAGNOSIS — G620 Drug-induced polyneuropathy: Secondary | ICD-10-CM | POA: Diagnosis not present

## 2019-02-01 DIAGNOSIS — M25561 Pain in right knee: Secondary | ICD-10-CM | POA: Diagnosis not present

## 2019-02-01 DIAGNOSIS — G8929 Other chronic pain: Secondary | ICD-10-CM | POA: Diagnosis not present

## 2019-02-01 DIAGNOSIS — R590 Localized enlarged lymph nodes: Secondary | ICD-10-CM

## 2019-02-01 DIAGNOSIS — L309 Dermatitis, unspecified: Secondary | ICD-10-CM | POA: Diagnosis not present

## 2019-02-01 DIAGNOSIS — R05 Cough: Secondary | ICD-10-CM

## 2019-02-01 DIAGNOSIS — Z23 Encounter for immunization: Secondary | ICD-10-CM

## 2019-02-01 DIAGNOSIS — R599 Enlarged lymph nodes, unspecified: Secondary | ICD-10-CM

## 2019-02-01 DIAGNOSIS — T451X5A Adverse effect of antineoplastic and immunosuppressive drugs, initial encounter: Secondary | ICD-10-CM

## 2019-02-01 DIAGNOSIS — R053 Chronic cough: Secondary | ICD-10-CM

## 2019-02-01 DIAGNOSIS — R059 Cough, unspecified: Secondary | ICD-10-CM

## 2019-02-01 MED ORDER — DICLOFENAC SODIUM 1 % TD GEL: TRANSDERMAL | 0 refills | Status: DC

## 2019-02-01 MED ORDER — GABAPENTIN 100 MG PO CAPS: 100.0000 mg | ORAL_CAPSULE | Freq: Every day | ORAL | 2 refills | Status: DC | PRN

## 2019-02-01 MED ORDER — TRIAMCINOLONE ACETONIDE 0.1 % EX CREA: 1.0000 "application " | TOPICAL_CREAM | Freq: Two times a day (BID) | CUTANEOUS | 0 refills | Status: DC | PRN

## 2019-02-01 NOTE — Patient Instructions (Addendum)
Refilled gabapentin and triamcinolone. Use aquaphor healing ointment on your face to help with eczema, use triamcinolone sparingly (not every day)  Referring to lung specialist, may need lung function testing which they can do there  For lymph node: Checking labs today Go get chest xray Referring to ENT doctor to get this evaluated  For knee - sent in ointment for you  See handout on scheduling for bariatric surgery  Call with any questions Follow up in 1 month   Be well, Dr. [name]

## 2019-02-01 NOTE — Progress Notes (Signed)
Date of Visit: 02/01/2019   HPI:  Patient presents for routine follow up. She has many issues she wants to discuss today.  Bariatric/hernia surgery - planning to have hernia repair done but also wants bariatric surgery. Wanted to get both done at the same time. Needs info on what practices take medicaid for bariatric surgery.  Knee pain - having pain in R knee. Requests medication or cream to help with it. Prev had steroid injections but wants to avoid this if possible.  "knot" under neck - has been there for some time. Dr. Mingo Amber told her it was a lymph node. It is enlarging. No fevers or other bumps elsewhere. She does use albuterol for occasional cough since February when she was treated for pneumonia and flu. Has blood in her sputum about once per month. Uses albuterol about once/mo as well. Nonsmoker. No family history of lung issues.  Neuropathy - takes gabapentin 100mg  daily as needed for neuropathy. Has neuropathy as a result of prior chemo. Needs gabapentin refilled. Tolerating this medication wlel.  Eczema - has eczema on her face, uses triamcinolone daily for this. Not using a particular moisturizer on face.  ROS: See HPI.  [place]: history of obesity, hypertension, prior ovarian cancer and endometrial cancer, CKD, GERD, chemo induced neuropathy, chronic anticoag, eczema, ventral hernia, question of rheumatologic disease (unclear)  PHYSICAL EXAM: BP 140/70   Pulse 71   Wt (!) 390 lb 12.8 oz (177.3 kg)   LMP 10/26/2013   SpO2 98%   BMI 64.04 kg/m  Gen: no acute distress, pleasant, cooperative HEENT: normocephalic, atraumatic. 1.5cm mobile submental lymph node present beneath R chin. No other enlarged lymph nodes in neck or head. No oral lesions. Moist mucous membranes  Lymph: no axillary lymphadenopathy  Heart: regular rate and rhythm, no murmur Lungs: clear to auscultation bilaterally, normal work of breathing  Neuro: alert, speech normal Ext: R knee with some lateral join  line tenderness. Some crepitus with flexion/extension. No effusion.  ASSESSMENT/PLAN:  Health maintenance:  -flu shot given today   Morbid obesity ([place]) Gave handout with info on what practices take medicaid for bariatric surgery ( [facility]). Patient appreciative.  Right knee pain Chronic issue, recently bothering her more. Rx voltaren for pain relief.  Enlarged lymph nodes Submental lymph node - given it is enlarging and patient is concerned, warrants workup. Check CBC, CMET, CXR, PPD. Refer to ENT for possible biopsy.   Cough Occasional hemoptysis - hemoptysis possibly related to being on chronic anticoagulation. Check CBC, CXR, PPD (we have no tubes for quant gold). Refer to pulm for assessment. May benefit from PFTs.  Chemotherapy-induced peripheral neuropathy ([facility]) Stable, refill gabapentin  Eczema Refill triamcinolone. Discussed using sparingly - instead use aquaphor healing ointment to keep skin moisturized  FOLLOW UP: Follow up in 1 month with me for above issues Referring to ENT and pulm  [place] J. [name], [place]

## 2019-02-02 ENCOUNTER — Ambulatory Visit (INDEPENDENT_AMBULATORY_CARE_PROVIDER_SITE_OTHER): Payer: Medicaid Other

## 2019-02-02 ENCOUNTER — Other Ambulatory Visit: Payer: Self-pay

## 2019-02-02 ENCOUNTER — Other Ambulatory Visit: Payer: Medicaid Other

## 2019-02-02 DIAGNOSIS — Z111 Encounter for screening for respiratory tuberculosis: Secondary | ICD-10-CM | POA: Diagnosis not present

## 2019-02-02 NOTE — Progress Notes (Signed)
Patient presents in nurse clinic for PPD. PPD placed in left ventral forearm, wheel present. Apt made for 02/04/2019 at 3pm to have site read. Reminder card given.

## 2019-02-03 LAB — CBC WITH DIFFERENTIAL/PLATELET
Basophils Absolute: 0 10*3/uL (ref 0.0–0.2)
Basos: 1 %
EOS (ABSOLUTE): 0.2 10*3/uL (ref 0.0–0.4)
Eos: 2 %
Hematocrit: 36.1 % (ref 34.0–46.6)
Hemoglobin: 11.6 g/dL (ref 11.1–15.9)
Immature Grans (Abs): 0 10*3/uL (ref 0.0–0.1)
Immature Granulocytes: 0 %
Lymphocytes Absolute: 2 10*3/uL (ref 0.7–3.1)
Lymphs: 31 %
MCH: 28.3 pg (ref 26.6–33.0)
MCHC: 32.1 g/dL (ref 31.5–35.7)
MCV: 88 fL (ref 79–97)
Monocytes Absolute: 0.6 10*3/uL (ref 0.1–0.9)
Monocytes: 9 %
Neutrophils Absolute: 3.7 10*3/uL (ref 1.4–7.0)
Neutrophils: 57 %
Platelets: 209 10*3/uL (ref 150–450)
RBC: 4.1 x10E6/uL (ref 3.77–5.28)
RDW: 14.3 % (ref 11.7–15.4)
WBC: 6.6 10*3/uL (ref 3.4–10.8)

## 2019-02-03 LAB — CMP14+EGFR
ALT: 7 IU/L (ref 0–32)
AST: 11 IU/L (ref 0–40)
Albumin/Globulin Ratio: 1.9 (ref 1.2–2.2)
Albumin: 4.3 g/dL (ref 3.8–4.9)
Alkaline Phosphatase: 126 IU/L — ABNORMAL HIGH (ref 39–117)
BUN/Creatinine Ratio: 19 (ref 9–23)
BUN: 21 mg/dL (ref 6–24)
Bilirubin Total: <0.2 mg/dL (ref 0.0–1.2)
CO2: 24 mmol/L (ref 20–29)
Calcium: 9.1 mg/dL (ref 8.7–10.2)
Chloride: 104 mmol/L (ref 96–106)
Creatinine, Ser: 1.12 mg/dL — ABNORMAL HIGH (ref 0.57–1.00)
GFR calc Af Amer: 64 mL/min/{1.73_m2} (ref >59)
GFR calc non Af Amer: 56 mL/min/{1.73_m2} — ABNORMAL LOW (ref >59)
Globulin, Total: 2.3 g/dL (ref 1.5–4.5)
Glucose: 81 mg/dL (ref 65–99)
Potassium: 5.1 mmol/L (ref 3.5–5.2)
Sodium: 141 mmol/L (ref 134–144)
Total Protein: 6.6 g/dL (ref 6.0–8.5)

## 2019-02-04 ENCOUNTER — Other Ambulatory Visit: Payer: Self-pay

## 2019-02-04 ENCOUNTER — Ambulatory Visit: Payer: Medicaid Other

## 2019-02-04 DIAGNOSIS — Z111 Encounter for screening for respiratory tuberculosis: Secondary | ICD-10-CM

## 2019-02-04 LAB — TB SKIN TEST: TB Skin Test: NEGATIVE

## 2019-02-04 NOTE — Progress Notes (Signed)
Patient presents in nurse clinic for PPD skin test reading. The results are as follows. Result letter given to patient.   PPD Reading Note PPD read and results entered in [place]. Result: 0 mm induration. Interpretation: Negative  Allergic reaction: no

## 2019-02-05 NOTE — Assessment & Plan Note (Signed)
Refill triamcinolone. Discussed using sparingly - instead use aquaphor healing ointment to keep skin moisturized

## 2019-02-05 NOTE — Assessment & Plan Note (Signed)
Stable, refill gabapentin

## 2019-02-05 NOTE — Assessment & Plan Note (Signed)
Chronic issue, recently bothering her more. Rx voltaren for pain relief.

## 2019-02-05 NOTE — Assessment & Plan Note (Signed)
Occasional hemoptysis - hemoptysis possibly related to being on chronic anticoagulation. Check CBC, CXR, PPD (we have no tubes for quant gold). Refer to pulm for assessment. May benefit from PFTs.

## 2019-02-05 NOTE — Assessment & Plan Note (Signed)
Gave handout with info on what practices take medicaid for bariatric surgery ( [facility]). Patient appreciative.

## 2019-02-05 NOTE — Assessment & Plan Note (Signed)
Submental lymph node - given it is enlarging and patient is concerned, warrants workup. Check CBC, CMET, CXR, PPD. Refer to ENT for possible biopsy.

## 2019-02-07 ENCOUNTER — Encounter: Payer: Self-pay | Admitting: Family Medicine

## 2019-02-19 ENCOUNTER — Inpatient Hospital Stay (HOSPITAL_COMMUNITY)
Admission: EM | Admit: 2019-02-19 | Discharge: 2019-02-21 | DRG: 193 | Disposition: A | Discharge status: Home / Self Care | Payer: Medicare Other | Attending: Student | Admitting: Student

## 2019-02-19 ENCOUNTER — Encounter (HOSPITAL_COMMUNITY): Payer: Self-pay | Admitting: Emergency Medicine

## 2019-02-19 ENCOUNTER — Other Ambulatory Visit: Payer: Self-pay

## 2019-02-19 ENCOUNTER — Emergency Department (HOSPITAL_COMMUNITY): Payer: Medicare Other

## 2019-02-19 ENCOUNTER — Emergency Department (HOSPITAL_BASED_OUTPATIENT_CLINIC_OR_DEPARTMENT_OTHER): Payer: Medicare Other

## 2019-02-19 DIAGNOSIS — J189 Pneumonia, unspecified organism: Secondary | ICD-10-CM | POA: Diagnosis not present

## 2019-02-19 DIAGNOSIS — Z8542 Personal history of malignant neoplasm of other parts of uterus: Secondary | ICD-10-CM | POA: Diagnosis not present

## 2019-02-19 DIAGNOSIS — Z791 Long term (current) use of non-steroidal anti-inflammatories (NSAID): Secondary | ICD-10-CM | POA: Diagnosis not present

## 2019-02-19 DIAGNOSIS — J9601 Acute respiratory failure with hypoxia: Secondary | ICD-10-CM | POA: Diagnosis present

## 2019-02-19 DIAGNOSIS — N3281 Overactive bladder: Secondary | ICD-10-CM | POA: Diagnosis present

## 2019-02-19 DIAGNOSIS — Z923 Personal history of irradiation: Secondary | ICD-10-CM | POA: Diagnosis not present

## 2019-02-19 DIAGNOSIS — E785 Hyperlipidemia, unspecified: Secondary | ICD-10-CM | POA: Diagnosis present

## 2019-02-19 DIAGNOSIS — M5412 Radiculopathy, cervical region: Secondary | ICD-10-CM | POA: Diagnosis present

## 2019-02-19 DIAGNOSIS — Z79899 Other long term (current) drug therapy: Secondary | ICD-10-CM

## 2019-02-19 DIAGNOSIS — D638 Anemia in other chronic diseases classified elsewhere: Secondary | ICD-10-CM | POA: Diagnosis present

## 2019-02-19 DIAGNOSIS — R0602 Shortness of breath: Secondary | ICD-10-CM | POA: Diagnosis not present

## 2019-02-19 DIAGNOSIS — Z7901 Long term (current) use of anticoagulants: Secondary | ICD-10-CM | POA: Diagnosis not present

## 2019-02-19 DIAGNOSIS — Z6841 Body Mass Index (BMI) 40.0 and over, adult: Secondary | ICD-10-CM

## 2019-02-19 DIAGNOSIS — R52 Pain, unspecified: Secondary | ICD-10-CM

## 2019-02-19 DIAGNOSIS — J181 Lobar pneumonia, unspecified organism: Secondary | ICD-10-CM | POA: Diagnosis not present

## 2019-02-19 DIAGNOSIS — Z9221 Personal history of antineoplastic chemotherapy: Secondary | ICD-10-CM | POA: Diagnosis not present

## 2019-02-19 DIAGNOSIS — F419 Anxiety disorder, unspecified: Secondary | ICD-10-CM | POA: Diagnosis present

## 2019-02-19 DIAGNOSIS — M79604 Pain in right leg: Secondary | ICD-10-CM

## 2019-02-19 DIAGNOSIS — I130 Hypertensive heart and chronic kidney disease with heart failure and stage 1 through stage 4 chronic kidney disease, or unspecified chronic kidney disease: Secondary | ICD-10-CM | POA: Diagnosis present

## 2019-02-19 DIAGNOSIS — K219 Gastro-esophageal reflux disease without esophagitis: Secondary | ICD-10-CM | POA: Diagnosis present

## 2019-02-19 DIAGNOSIS — Z8543 Personal history of malignant neoplasm of ovary: Secondary | ICD-10-CM | POA: Diagnosis not present

## 2019-02-19 DIAGNOSIS — Z86718 Personal history of other venous thrombosis and embolism: Secondary | ICD-10-CM | POA: Diagnosis not present

## 2019-02-19 DIAGNOSIS — Z20828 Contact with and (suspected) exposure to other viral communicable diseases: Secondary | ICD-10-CM | POA: Diagnosis present

## 2019-02-19 DIAGNOSIS — G8929 Other chronic pain: Secondary | ICD-10-CM | POA: Diagnosis present

## 2019-02-19 DIAGNOSIS — I5032 Chronic diastolic (congestive) heart failure: Secondary | ICD-10-CM | POA: Diagnosis present

## 2019-02-19 DIAGNOSIS — M159 Polyosteoarthritis, unspecified: Secondary | ICD-10-CM | POA: Diagnosis not present

## 2019-02-19 DIAGNOSIS — N183 Chronic kidney disease, stage 3 unspecified: Secondary | ICD-10-CM | POA: Diagnosis present

## 2019-02-19 DIAGNOSIS — N1831 Chronic kidney disease, stage 3a: Secondary | ICD-10-CM | POA: Diagnosis not present

## 2019-02-19 LAB — COMPREHENSIVE METABOLIC PANEL
ALT: 9 U/L (ref 0–44)
AST: 13 U/L — ABNORMAL LOW (ref 15–41)
Albumin: 3.5 g/dL (ref 3.5–5.0)
Alkaline Phosphatase: 91 U/L (ref 38–126)
Anion gap: 8 (ref 5–15)
BUN: 19 mg/dL (ref 6–20)
CO2: 24 mmol/L (ref 22–32)
Calcium: 8.9 mg/dL (ref 8.9–10.3)
Chloride: 108 mmol/L (ref 98–111)
Creatinine, Ser: 1.13 mg/dL — ABNORMAL HIGH (ref 0.44–1.00)
GFR calc Af Amer: >60 mL/min (ref >60)
GFR calc non Af Amer: 55 mL/min — ABNORMAL LOW (ref >60)
Glucose, Bld: 122 mg/dL — ABNORMAL HIGH (ref 70–99)
Potassium: 4.4 mmol/L (ref 3.5–5.1)
Sodium: 140 mmol/L (ref 135–145)
Total Bilirubin: 0.5 mg/dL (ref 0.3–1.2)
Total Protein: 6.8 g/dL (ref 6.5–8.1)

## 2019-02-19 LAB — CBC WITH DIFFERENTIAL/PLATELET
Abs Immature Granulocytes: 0.03 10*3/uL (ref 0.00–0.07)
Basophils Absolute: 0 10*3/uL (ref 0.0–0.1)
Basophils Relative: 0 %
Eosinophils Absolute: 0 10*3/uL (ref 0.0–0.5)
Eosinophils Relative: 0 %
HCT: 35.5 % — ABNORMAL LOW (ref 36.0–46.0)
Hemoglobin: 11 g/dL — ABNORMAL LOW (ref 12.0–15.0)
Immature Granulocytes: 0 %
Lymphocytes Relative: 8 %
Lymphs Abs: 0.9 10*3/uL (ref 0.7–4.0)
MCH: 28.4 pg (ref 26.0–34.0)
MCHC: 31 g/dL (ref 30.0–36.0)
MCV: 91.5 fL (ref 80.0–100.0)
Monocytes Absolute: 0.7 10*3/uL (ref 0.1–1.0)
Monocytes Relative: 6 %
Neutro Abs: 10.2 10*3/uL — ABNORMAL HIGH (ref 1.7–7.7)
Neutrophils Relative %: 86 %
Platelets: 173 10*3/uL (ref 150–400)
RBC: 3.88 MIL/uL (ref 3.87–5.11)
RDW: 14 % (ref 11.5–15.5)
WBC: 11.9 10*3/uL — ABNORMAL HIGH (ref 4.0–10.5)
nRBC: 0 % (ref 0.0–0.2)

## 2019-02-19 LAB — D-DIMER, QUANTITATIVE: D-Dimer, Quant: 0.48 ug/mL-FEU (ref 0.00–0.50)

## 2019-02-19 LAB — LACTIC ACID, PLASMA: Lactic Acid, Venous: 0.9 mmol/L (ref 0.5–1.9)

## 2019-02-19 LAB — PROCALCITONIN: Procalcitonin: 0.27 ng/mL

## 2019-02-19 LAB — FIBRINOGEN: Fibrinogen: 491 mg/dL — ABNORMAL HIGH (ref 210–475)

## 2019-02-19 LAB — TRIGLYCERIDES: Triglycerides: 64 mg/dL (ref <150)

## 2019-02-19 LAB — LACTATE DEHYDROGENASE: LDH: 178 U/L (ref 98–192)

## 2019-02-19 LAB — SARS CORONAVIRUS 2 (TAT 6-24 HRS): SARS Coronavirus 2: NEGATIVE

## 2019-02-19 LAB — C-REACTIVE PROTEIN: CRP: 2.8 mg/dL — ABNORMAL HIGH (ref <1.0)

## 2019-02-19 LAB — FERRITIN: Ferritin: 33 ng/mL (ref 11–307)

## 2019-02-19 LAB — HIV ANTIBODY (ROUTINE TESTING W REFLEX): HIV Screen 4th Generation wRfx: NONREACTIVE

## 2019-02-19 MED ORDER — SODIUM CHLORIDE 0.9 % IV SOLN
500.0000 mg | INTRAVENOUS | Status: DC
  Administered 2019-02-20: 13:00:00 500 mg via INTRAVENOUS
  Filled 2019-02-19 (×2): qty 500

## 2019-02-19 MED ORDER — LORATADINE 10 MG PO TABS
10.0000 mg | ORAL_TABLET | Freq: Every day | ORAL | Status: DC
  Administered 2019-02-19 – 2019-02-21 (×3): 10 mg via ORAL
  Filled 2019-02-19 (×3): qty 1

## 2019-02-19 MED ORDER — METHOCARBAMOL 500 MG PO TABS: 750.0000 mg | ORAL_TABLET | Freq: Three times a day (TID) | ORAL | Status: DC | PRN

## 2019-02-19 MED ORDER — FESOTERODINE FUMARATE ER 8 MG PO TB24
8.0000 mg | ORAL_TABLET | Freq: Every day | ORAL | Status: DC
  Administered 2019-02-19 – 2019-02-20 (×2): 8 mg via ORAL
  Filled 2019-02-19 (×3): qty 1

## 2019-02-19 MED ORDER — LIDOCAINE-PRILOCAINE 2.5-2.5 % EX CREA: 1.0000 "application " | TOPICAL_CREAM | CUTANEOUS | Status: DC | PRN

## 2019-02-19 MED ORDER — RIVAROXABAN 20 MG PO TABS
20.0000 mg | ORAL_TABLET | Freq: Every day | ORAL | Status: DC
  Administered 2019-02-19 – 2019-02-20 (×2): 20 mg via ORAL
  Filled 2019-02-19 (×2): qty 1

## 2019-02-19 MED ORDER — ACETAMINOPHEN 325 MG PO TABS
650.0000 mg | ORAL_TABLET | Freq: Four times a day (QID) | ORAL | Status: DC | PRN
  Administered 2019-02-20 (×2): 650 mg via ORAL
  Filled 2019-02-19 (×2): qty 2

## 2019-02-19 MED ORDER — DICLOFENAC SODIUM 1 % TD GEL
2.0000 g | Freq: Every day | TRANSDERMAL | Status: DC | PRN
  Filled 2019-02-19: qty 100

## 2019-02-19 MED ORDER — ALBUTEROL SULFATE HFA 108 (90 BASE) MCG/ACT IN AERS: 2.0000 | INHALATION_SPRAY | Freq: Four times a day (QID) | RESPIRATORY_TRACT | Status: DC | PRN

## 2019-02-19 MED ORDER — ALBUTEROL SULFATE (2.5 MG/3ML) 0.083% IN NEBU: 2.5000 mg | INHALATION_SOLUTION | RESPIRATORY_TRACT | Status: DC | PRN

## 2019-02-19 MED ORDER — SODIUM CHLORIDE 0.9 % IV SOLN
500.0000 mg | Freq: Once | INTRAVENOUS | Status: AC
  Administered 2019-02-19: 500 mg via INTRAVENOUS
  Filled 2019-02-19: qty 500

## 2019-02-19 MED ORDER — TRIAMCINOLONE ACETONIDE 0.1 % EX CREA
1.0000 "application " | TOPICAL_CREAM | Freq: Two times a day (BID) | CUTANEOUS | Status: DC | PRN
  Filled 2019-02-19: qty 15

## 2019-02-19 MED ORDER — SODIUM CHLORIDE 0.9 % IV SOLN
1.0000 g | Freq: Once | INTRAVENOUS | Status: AC
  Administered 2019-02-19: 1 g via INTRAVENOUS
  Filled 2019-02-19: qty 10

## 2019-02-19 MED ORDER — HYDROCODONE-ACETAMINOPHEN 5-325 MG PO TABS
1.0000 | ORAL_TABLET | Freq: Four times a day (QID) | ORAL | Status: DC | PRN
  Administered 2019-02-19 – 2019-02-20 (×3): 1 via ORAL
  Filled 2019-02-19 (×4): qty 1

## 2019-02-19 MED ORDER — SODIUM CHLORIDE 0.9 % IV SOLN
1.0000 g | Freq: Once | INTRAVENOUS | Status: AC
  Administered 2019-02-19: 14:00:00 1 g via INTRAVENOUS
  Filled 2019-02-19: qty 10

## 2019-02-19 MED ORDER — ALBUTEROL SULFATE HFA 108 (90 BASE) MCG/ACT IN AERS
2.0000 | INHALATION_SPRAY | Freq: Once | RESPIRATORY_TRACT | Status: AC
  Administered 2019-02-19: 2 via RESPIRATORY_TRACT
  Filled 2019-02-19: qty 6.7

## 2019-02-19 MED ORDER — FUROSEMIDE 20 MG PO TABS: 20.0000 mg | ORAL_TABLET | Freq: Every day | ORAL | Status: DC | PRN

## 2019-02-19 MED ORDER — GABAPENTIN 100 MG PO CAPS: 100.0000 mg | ORAL_CAPSULE | Freq: Every day | ORAL | Status: DC | PRN

## 2019-02-19 MED ORDER — SODIUM CHLORIDE 0.9 % IV SOLN
2.0000 g | INTRAVENOUS | Status: DC
  Administered 2019-02-20: 11:00:00 2 g via INTRAVENOUS
  Filled 2019-02-19: qty 20
  Filled 2019-02-19: qty 2

## 2019-02-19 MED ORDER — PANTOPRAZOLE SODIUM 40 MG PO TBEC
40.0000 mg | DELAYED_RELEASE_TABLET | Freq: Two times a day (BID) | ORAL | Status: DC
  Administered 2019-02-19 – 2019-02-21 (×5): 40 mg via ORAL
  Filled 2019-02-19 (×5): qty 1

## 2019-02-19 MED ORDER — GUAIFENESIN 100 MG/5ML PO SOLN
10.0000 mL | Freq: Once | ORAL | Status: AC
  Administered 2019-02-19: 07:00:00 200 mg via ORAL
  Filled 2019-02-19: qty 10

## 2019-02-19 NOTE — ED Notes (Signed)
Pt was ambulated, O2 stats dropped to 83% and heart rate was 127 bpm, Pt had walked to the bathroom and had trouble breathing pt had to stop multiple times to catch breath.

## 2019-02-19 NOTE — Progress Notes (Signed)
VASCULAR LAB PRELIMINARY  PRELIMINARY  PRELIMINARY  PRELIMINARY  Right lower extremity venous duplex completed.    Preliminary report:  See CV proc for preliminary results.  Gave report to Benedetto Goad, PA-C  Kerney Hopfensperger, RVT 02/19/2019, 10:31 AM

## 2019-02-19 NOTE — H&P (Signed)
History and Physical    [name] [id number] DOB: 1966/03/12 DOA: 02/19/2019  PCP: Leeanne Rio, MD   Patient coming from: Home  I have personally briefly reviewed patient's old medical records in [place]  Chief Complaint: Leg pain, cough and fevers  HPI: [name] is a 53 y.o. female with medical history significant for endometrial and ovarian cancer status post surgery and chemotherapy in remission, DVT in setting of malignancy, morbid obesity plans for bariatric surgery, GERD, hypertension, hyperlipidemia and osteoarthritis who presents with fevers, chills and right leg pain.  Patient reports she was in her usual state of health up until 3 days prior.  She states she started having right lower extremity pain and thought it may be from her chronic issues with her right knee and osteoarthritis but given she has a history of a DVT that was also on her mind.  She states yesterday evening she began to have fevers up to 101.2 and chills in the middle the night.  She does have cough but reports that she always has a cough so that was not initially worrisome.  She states she had gone to her granddaughters home, after they had had a birthday party, but only saw a couple people.  She lives at home with her parents so denies otherwise having any sick contacts.  She denies any chest pain, palpitations.  She has no diarrhea, no loss of sense of smell or taste.  She did vomit once but after a coughing spell.  She did bring up some sputum blood-tinged.  She denies any smoking, alcohol use, or drug use.  Of note, she reports that her DVTs were not in the setting of Xarelto but in the setting of malignancy and she developed the second DVT while off anticoagulation.  She also reports she does not have a confirmed history of lupus.  She lives at home with her parents.  She ambulates with a cane.  Otherwise she is independent in her ADLs.  In the ED she was noted to be  hypoxic with activity and was placed on 2 L oxygen at rest as well  Review of Systems: As per HPI otherwise 10 point review of systems negative.    Past Medical History:  Diagnosis Date   Allergy    Anemia    Anxiety    Arthritis    Blood transfusion without reported diagnosis    Cough    Diverticulitis with perforation 2010   DVT (deep vein thrombosis) in pregnancy 11/24/2016   DVT, lower extremity (HCC)    Endometrial cancer ([place])    Family history of adverse reaction to anesthesia    pts daughter had difficulty awakening following anesthesia, long time to wake up   GERD (gastroesophageal reflux disease)    Headache    History of bronchitis    History of ear infections    Hx of migraines    Hyperlipidemia    Hypertension    IBS (irritable bowel syndrome)    Kidney infection    Lupus (systemic lupus erythematosus) ([place]) 02/2017   Morbid obesity ([place])    Ovarian cancer ([place]) dx'd 01/2015   PONV (postoperative nausea and vomiting)    Pyelonephritis    Scleroderma ([place])     Past Surgical History:  Procedure Laterality Date   ABDOMINAL HYSTERECTOMY N/A 03/13/2015   Procedure: TOTAL HYSTERECTOMY ABDOMINAL BILATERAL SALPINGO OOPHORECTOMY RADICAL TUMOR [place];  Surgeon: Everitt Amber, MD;  Location: [facility];  Service: Gynecology;  Laterality: N/A;   BOWEL RESECTION  03/13/2015   Procedure: SMALL BOWEL RESECTION;  Surgeon: Everitt Amber, MD;  Location: [facility];  Service: Gynecology;;   [place] and 1984   COLOSTOMY  2008   COLOSTOMY TAKEDOWN     ESOPHAGOGASTRODUODENOSCOPY (EGD) WITH PROPOFOL N/A 12/21/2018   Procedure: ESOPHAGOGASTRODUODENOSCOPY (EGD) WITH PROPOFOL;  Surgeon: Doran Stabler, MD;  Location: [facility];  Service: Gastroenterology;  Laterality: N/A;   LAPAROTOMY N/A 03/13/2015   Procedure: EXPLORATORY LAPAROTOMY;  Surgeon: Everitt Amber, MD;  Location: [facility];  Service: Gynecology;  Laterality: N/A;   TONSILLECTOMY AND  ADENOIDECTOMY  1997   VENTRAL HERNIA REPAIR  03/13/2015   Procedure: HERNIA REPAIR VENTRAL ADULT;  Surgeon: Everitt Amber, MD;  Location: [facility];  Service: Gynecology;;     reports that she has never smoked. She has never used smokeless tobacco. She reports current alcohol use. She reports that she does not use drugs.  Allergies  Allergen Reactions   Penicillins Rash    Has patient had a PCN reaction causing immediate rash, facial/tongue/throat swelling, SOB or lightheadedness with hypotension: no Has patient had a PCN reaction causing severe rash involving mucus membranes or skin necrosis: no Has patient had a PCN reaction that required hospitalization in the hospital at the time Has patient had a PCN reaction occurring within the last 10 years: no If all of the above answers are "NO", then may proceed with Cephalosporin use.     Family History  Problem Relation Age of Onset   Diabetes Mother    Hypertension Mother    Hyperlipidemia Mother    Colon polyps Mother        4 total   Fibroids Mother        s/p hysterectomy   Hyperlipidemia Father    Diabetes Daughter    Hodgkin's lymphoma Daughter 42   Asthma Maternal Aunt        severe - d. 24   Prostate cancer Maternal Uncle 47   Diabetes Paternal Aunt    Diabetes Paternal Uncle    Kidney failure Maternal Grandmother 84   Pancreatic cancer Paternal Grandmother        dx. >50   Diabetes Paternal Grandfather    Diabetes Paternal Aunt    Heart disease Neg Hx    Stroke Neg Hx    Colon cancer Neg Hx    Stomach cancer Neg Hx    Esophageal cancer Neg Hx    Rectal cancer Neg Hx      Prior to Admission medications   Medication Sig Start Date End Date Taking? Authorizing Provider  acetaminophen (TYLENOL) 500 MG tablet Take 1,000 mg by mouth every 6 (six) hours as needed for moderate pain.   Yes [provider]  albuterol (PROVENTIL) (2.5 MG/3ML) 0.083% nebulizer solution Take 3 mLs (2.5 mg total)  by nebulization every 4 (four) hours as needed for wheezing or shortness of breath. 05/31/18  Yes Patrecia Pour, MD  albuterol (VENTOLIN HFA) 108 (90 Base) MCG/ACT inhaler Inhale 2 puffs into the lungs every 6 (six) hours as needed for wheezing or shortness of breath. Please instruct patient in usage. Patient taking differently: Inhale 2 puffs into the lungs every 6 (six) hours as needed for wheezing or shortness of breath.  12/19/18  Yes Leeanne Rio, MD  cetirizine (ZYRTEC) 10 MG tablet Take 1 tablet (10 mg total) by mouth daily as needed for allergies (itching). 01/22/16  Yes Leeanne Rio,  MD  diclofenac sodium (VOLTAREN) 1 % GEL Apply to knee once per day Patient taking differently: Apply 2 g topically daily as needed (for knee pain).  02/01/19  Yes Leeanne Rio, MD  fesoterodine (TOVIAZ) 8 MG TB24 tablet TAKE 1 TABLET (8MG  TOTAL) BY MOUTH DAILY. Patient taking differently: Take 8 mg by mouth at bedtime. TAKE 1 TABLET (8MG  TOTAL) BY MOUTH DAILY. 11/26/18  Yes Leeanne Rio, MD  furosemide (LASIX) 20 MG tablet Take 20 mg by mouth daily as needed for fluid.   Yes [provider]  gabapentin (NEURONTIN) 100 MG capsule Take 1 capsule (100 mg total) by mouth daily as needed (neuropathy). 02/01/19  Yes Leeanne Rio, MD  Iron-Vitamins (GERITOL COMPLETE PO) Take 1 tablet by mouth once a week.    Yes [provider]  lidocaine-prilocaine (EMLA) cream Apply 1 application topically as needed (prior to port being accessed).   Yes [provider]  methocarbamol (ROBAXIN) 750 MG tablet Take 1 tablet (750 mg total) by mouth 3 (three) times daily as needed for muscle spasms. 11/09/18  Yes Draper, Carlos Levering, DO  omeprazole (PRILOSEC) 40 MG capsule Take twice daily, 1 before breakfast and 1 before supper. Patient taking differently: Take 40 mg by mouth 2 (two) times daily. Take twice daily, 1 before breakfast and 1 before supper. 11/24/18  Yes Danis, Kirke Corin, MD  rivaroxaban (XARELTO) 20 MG TABS tablet Take 1 tablet (20 mg total) by mouth every morning. 09/03/18  Yes Alveda Reasons, MD  triamcinolone cream (KENALOG) 0.1 % Apply 1 application topically 2 (two) times daily as needed (skin irritation). APPLY TO THE AFFECTED AREA(S) TWICE DAILY AS NEEDED Patient taking differently: Apply 1 application topically 2 (two) times daily as needed (skin irritation).  02/01/19  Yes Leeanne Rio, MD    Physical Exam: Vitals:   02/19/19 0733 02/19/19 0800 02/19/19 0830 02/19/19 0900  BP: (!) 161/95 (!) 161/97 (!) 156/82 (!) 155/80  Pulse: 88 87  85  Resp:  20 18 18   Temp: 99.3 F (37.4 C)     TempSrc:      SpO2: 99% 98%  97%  Weight:      Height:        Constitutional: NAD, calm, comfortable Vitals:   02/19/19 0733 02/19/19 0800 02/19/19 0830 02/19/19 0900  BP: (!) 161/95 (!) 161/97 (!) 156/82 (!) 155/80  Pulse: 88 87  85  Resp:  20 18 18   Temp: 99.3 F (37.4 C)     TempSrc:      SpO2: 99% 98%  97%  Weight:      Height:        General: Pleasant man no apparent distress, morbidly obese Eyes: EOMI, anicteric sclera ENMT: Mucous membranes are moist. Posterior pharynx clear of any exudate or lesions.Normal dentition.  Neck: normal, supple, no masses, no thyromegaly Respiratory: Posterior left lung base with crackles otherwise clear and without any wheezing Cardiovascular: Regular rate and rhythm, no murmurs / rubs / gallops. No extremity edema. 2+ pedal pulses. No carotid bruits.  Abdomen: Obese, no tenderness, no masses palpated. No hepatosplenomegaly. Bowel sounds positive. Hernia present Musculoskeletal: no clubbing / cyanosis. No joint deformity upper and lower extremities. Good ROM, no contractures. Normal muscle tone.   Skin: no rashes, lesions, ulcers. No induration.  She has a port in place but this has not been accessed since 2018. Neurologic: CN 2-12 grossly intact.  Motor and sensory exam grossly intact, no focal  deficits appreciated Psychiatric: Normal judgment and insight. Alert and oriented x 3. Normal mood.    Labs on Admission: I have personally reviewed following labs and imaging studies  CBC: Recent Labs  Lab 02/19/19 0816  WBC 11.9*  NEUTROABS 10.2*  HGB 11.0*  HCT 35.5*  MCV 91.5  PLT [id number]   Basic Metabolic Panel: Recent Labs  Lab 02/19/19 0816  NA 140  K 4.4  CL 108  CO2 24  GLUCOSE 122*  BUN 19  CREATININE 1.13*  CALCIUM 8.9   GFR: Estimated Creatinine Clearance: 94.4 mL/min (A) (by C-G formula based on SCr of 1.13 mg/dL (H)). Liver Function Tests: Recent Labs  Lab 02/19/19 0816  AST 13*  ALT 9  ALKPHOS 91  BILITOT 0.5  PROT 6.8  ALBUMIN 3.5   No results for input(s): LIPASE, AMYLASE in the last 168 hours. No results for input(s): AMMONIA in the last 168 hours. Coagulation Profile: No results for input(s): INR, PROTIME in the last 168 hours. Cardiac Enzymes: No results for input(s): CKTOTAL, CKMB, CKMBINDEX, TROPONINI in the last 168 hours. BNP (last 3 results) No results for input(s): PROBNP in the last 8760 hours. HbA1C: No results for input(s): HGBA1C in the last 72 hours. CBG: No results for input(s): GLUCAP in the last 168 hours. Lipid Profile: No results for input(s): CHOL, HDL, LDLCALC, TRIG, CHOLHDL, LDLDIRECT in the last 72 hours. Thyroid Function Tests: No results for input(s): TSH, T4TOTAL, FREET4, T3FREE, THYROIDAB in the last 72 hours. Anemia Panel: No results for input(s): VITAMINB12, FOLATE, FERRITIN, TIBC, IRON, RETICCTPCT in the last 72 hours. Urine analysis:    Component Value Date/Time   COLORURINE AMBER (A) 03/14/2015 2118   APPEARANCEUR CLOUDY (A) 03/14/2015 2118   LABSPEC 1.015 04/23/2015 1247   PHURINE 5.0 04/23/2015 1247   PHURINE 5.5 03/14/2015 2118   GLUCOSEU Negative 04/23/2015 1247   HGBUR Negative 04/23/2015 1247   HGBUR MODERATE (A) 03/14/2015 2118   BILIRUBINUR Color Interference 04/23/2015 1247   KETONESUR  Negative 04/23/2015 1247   KETONESUR NEGATIVE 03/14/2015 2118   PROTEINUR Color Interference 04/23/2015 1247   PROTEINUR NEGATIVE 03/14/2015 2118   UROBILINOGEN Color Interference 04/23/2015 1247   NITRITE Color Interference 04/23/2015 1247   NITRITE NEGATIVE 03/14/2015 2118   LEUKOCYTESUR Color Interference 04/23/2015 1247    Radiological Exams on Admission: Dg Chest 2 View  Result Date: 02/19/2019 CLINICAL DATA:  Cough. Fever. Small amount of hemoptysis. EXAM: CHEST - 2 VIEW COMPARISON:  02/01/2019 FINDINGS: Interval extensive patchy airspace opacity throughout the majority of the left lung. No pleural fluid seen. Clear right lung. Unremarkable bones. Stable right jugular porta catheter. IMPRESSION: Interval extensive left lung pneumonia. Electronically Signed   By: Claudie Revering M.D.   On: 02/19/2019 08:07   Vas [place] Lower Extremity Venous (dvt) (only [facility] & [facility] 7a-7p)  Result Date: 02/19/2019  Lower Venous Study Indications: Pain.  Risk Factors: Patient compliant with Xarelto. Limitations: Body habitus and depth of vessels. Comparison Study: Prior study from 11/23/16 is available for comparison Performing Technologist: Sharion Dove RVS  Examination Guidelines: A complete evaluation includes B-mode imaging, spectral Doppler, color Doppler, and power Doppler as needed of all accessible portions of each vessel. Bilateral testing is considered an integral part of a complete examination. Limited examinations for reoccurring indications may be performed as noted.  +---------+---------------+---------+-----------+----------+-------------------+  RIGHT     Compressibility Phasicity Spontaneity Properties Thrombus Aging       +---------+---------------+---------+-----------+----------+-------------------+  CFV       Full  Yes       Yes                                         +---------+---------------+---------+-----------+----------+-------------------+  SFJ       Full                                                                   +---------+---------------+---------+-----------+----------+-------------------+  FV Prox                   Yes       Yes                    patent by color and                                                              Doppler              +---------+---------------+---------+-----------+----------+-------------------+  FV Mid    Full                                                                  +---------+---------------+---------+-----------+----------+-------------------+  FV Distal                                                  patent by color      +---------+---------------+---------+-----------+----------+-------------------+  PFV                                                        Not visualized       +---------+---------------+---------+-----------+----------+-------------------+  POP                                                        patent by color and                                                              Doppler              +---------+---------------+---------+-----------+----------+-------------------+  PTV  Not visualized       +---------+---------------+---------+-----------+----------+-------------------+  PERO                                                       Not visualized       +---------+---------------+---------+-----------+----------+-------------------+   Right Technical Findings: Not visualized segments include profunda, peroneal, posterior tibial.  Left Technical Findings: Left leg not evaluated.   Summary: Right: Findings appear essentially unchanged compared to previous examination. There is no evidence of deep vein thrombosis proximal to the inguinal ligament or in the common femoral vein.  *See table(s) above for measurements and observations. Electronically signed by Monica Martinez MD on 02/19/2019 at 10:36:24 AM.    Final     EKG: Independently  reviewed.  Assessment/Plan MURIEL BABERS is a 53 y.o. female with medical history significant for endometrial and ovarian cancer status post surgery and chemotherapy in remission, DVT in setting of malignancy, morbid obesity plans for bariatric surgery, GERD, hypertension, hyperlipidemia and osteoarthritis who presents with fevers, chills and right leg pain, and presentation consistent with sepsis secondary to pneumonia.  #Sepsis 2/2 pneumonia, community-acquired #Acute hypoxic respiratory failure -Risk of resistant bugs low per history and risk factors but with left lower lobe opacities fevers and leukocytosis consistent with sepsis -We will obtain procalcitonin, Legionella and strep pneumo urine antigens, MRSA screen, sputum and blood cultures -We will obtain respiratory viral panel, COVID-19 -Continue empiric treatment with ceftriaxone and azithromycin -Continue inhaler as needed  #History of ovarian and endometrial cancer status post surgery and chemo in remission #Malignancy associated DVT -Continue Xarelto - venous dopplers did not reveal any DVT and suspect knee pain from OA  # Osteoarthritis # Cervical radiculopathy - continue prn pain meds and topical, exacerbated by obesity - continue with cane - will order PT - continue gabapentin - follows outpatient with Sports Medicine and PT  # GERD - continue PPI  # Overactive bladder - continue fesoterodine  # Morbid Obesity - BMI 0000000 - complicates all aspects of care - patient has attempted weight loss in past, plan for bariatric surgery next year    DVT prophylaxis: Xarelto Code Status: Full Disposition Plan: Pending  Admission status: inpatient   Truddie Hidden MD [facility] Pager [phone]  If 7PM-7AM, please contact night-coverage [email or web address] Password [email or web address]  02/19/2019, 10:46 AM

## 2019-02-19 NOTE — ED Provider Notes (Signed)
[facility] DEPT Provider Note   CSN: [id number] Arrival date & time: 02/19/19  [id number]     History   Chief Complaint Chief Complaint  Patient presents with  . Fever  . Leg Pain    HPI NAYDELIN LOACH is a 53 y.o. female.     CINDIE DAIGLER is a 53 y.o. female with a history of morbid obesity, DVT on Xarelto, hypertension, hyperlipidemia, migraines, IBS, lupus, who presents to the emergency department for evaluation of pain in the right upper leg, as well as cough and fever.  Patient reports that she has issues with chronic knee pain but starting yesterday she started to notice some pain over her right inner thigh which she reports has felt similar to her previous blood clots, she is currently on Xarelto and has not missed any doses.  She is not noted any new swelling in the leg.  No change in coloration.  At first thought this pain was related to her chronic knee problems.  She also reports that overnight she started having some chills and fevers, T-max of 101.2.  Took Tylenol at about 3 AM.  Reports that yesterday evening she started to notice an occasional cough that is productive of mucus.  Noted a small amount of blood in her mucus once.  Denies associated chest pain, reports that she only feels short of breath after several episodes of coughing.  She denies any abdominal pain or diarrhea, did have one episode of posttussive emesis.  No dysuria or urinary frequency.  Patient reports that she was recently had a birthday party with other family members, but denies any known sick contacts, no other aggravating or alleviating factors.     Past Medical History:  Diagnosis Date  . Allergy   . Anemia   . Anxiety   . Arthritis   . Blood transfusion without reported diagnosis   . Cough   . Diverticulitis with perforation 2010  . DVT (deep vein thrombosis) in pregnancy 11/24/2016  . DVT, lower extremity ([place])   . Endometrial cancer ([place])   . Family  history of adverse reaction to anesthesia    pts daughter had difficulty awakening following anesthesia, long time to wake up  . GERD (gastroesophageal reflux disease)   . Headache   . History of bronchitis   . History of ear infections   . Hx of migraines   . Hyperlipidemia   . Hypertension   . IBS (irritable bowel syndrome)   . Kidney infection   . Lupus (systemic lupus erythematosus) ([place]) 02/2017  . Morbid obesity ([place])   . Ovarian cancer ([place]) dx'd 01/2015  . PONV (postoperative nausea and vomiting)   . Pyelonephritis   . Scleroderma [facility])     Patient Active Problem List   Diagnosis Date Noted  . Neck pain 01/13/2019  . Lateral epicondylitis of left elbow 01/13/2019  . Cough 06/11/2018  . Difficulty sleeping 06/11/2018  . Normocytic anemia 05/29/2018  . UTI (urinary tract infection) 05/07/2018  . Scleroderma ([place]) 04/22/2018  . Hemoptysis 04/22/2018  . Swelling of foot joint, right 04/08/2018  . Left shoulder pain 11/11/2017  . Enlarged lymph nodes 08/13/2017  . Cramping of hands 12/31/2016  . Fatigue 11/13/2016  . Positive ANA (antinuclear antibody) 09/23/2016  . Eczema 01/27/2016  . Pulmonary embolism ([place]) 09/12/2015  . Portacath in place 09/12/2015  . Long term current use of anticoagulant therapy 09/06/2015  . Slow rate of speech 08/22/2015  . Chemotherapy-induced peripheral  neuropathy ([place]) 06/13/2015  . CKD (chronic kidney disease) stage 3, GFR 30-59 ml/min 05/12/2015  . GERD (gastroesophageal reflux disease) 05/12/2015  . Ovarian carcinosarcoma, right ([place]) 03/29/2015  . Endometrial ca ([place]) 03/29/2015  . Ventral hernia without obstruction or gangrene   . Morbid obesity ([place]) 02/19/2015  . Essential hypertension, benign   . Stress at home 12/14/2014  . Left knee pain 08/04/2013  . Seasonal allergies 07/29/2013  . Right knee pain 11/28/2011    Past Surgical History:  Procedure Laterality Date  . ABDOMINAL HYSTERECTOMY N/A 03/13/2015   Procedure:  TOTAL HYSTERECTOMY ABDOMINAL BILATERAL SALPINGO OOPHORECTOMY RADICAL TUMOR [place];  Surgeon: Everitt Amber, MD;  Location: [facility] OR[facility];  Service: Gynecology;  Laterality: N/A;  . BOWEL RESECTION  03/13/2015   Procedure: SMALL BOWEL RESECTION;  Surgeon: Everitt Amber, MD;  Location: [facility];  Service: Gynecology;;  . [place] and 1984  . COLOSTOMY  2008  . COLOSTOMY TAKEDOWN    . ESOPHAGOGASTRODUODENOSCOPY (EGD) WITH PROPOFOL N/A 12/21/2018   Procedure: ESOPHAGOGASTRODUODENOSCOPY (EGD) WITH PROPOFOL;  Surgeon: Doran Stabler, MD;  Location: [facility];  Service: Gastroenterology;  Laterality: N/A;  . LAPAROTOMY N/A 03/13/2015   Procedure: EXPLORATORY LAPAROTOMY;  Surgeon: Everitt Amber, MD;  Location: [facility];  Service: Gynecology;  Laterality: N/A;  . TONSILLECTOMY AND ADENOIDECTOMY  1997  . VENTRAL HERNIA REPAIR  03/13/2015   Procedure: HERNIA REPAIR VENTRAL ADULT;  Surgeon: Everitt Amber, MD;  Location: [facility];  Service: Gynecology;;     OB History    Gravida  2   Para      Term      Preterm      AB      Living  2     SAB      TAB      Ectopic      Multiple      Live Births  2            Home Medications    Prior to Admission medications   Medication Sig Start Date End Date Taking? Authorizing Provider  acetaminophen (TYLENOL) 500 MG tablet Take 1,000 mg by mouth every 6 (six) hours as needed for moderate pain.    [provider]  albuterol (PROVENTIL) (2.5 MG/3ML) 0.083% nebulizer solution Take 3 mLs (2.5 mg total) by nebulization every 4 (four) hours as needed for wheezing or shortness of breath. 05/31/18   Patrecia Pour, MD  albuterol (VENTOLIN HFA) 108 (90 Base) MCG/ACT inhaler Inhale 2 puffs into the lungs every 6 (six) hours as needed for wheezing or shortness of breath. Please instruct patient in usage. 12/19/18   Leeanne Rio, MD  cetirizine (ZYRTEC) 10 MG tablet Take 1 tablet (10 mg total) by mouth daily as needed for allergies (itching).  01/22/16   Leeanne Rio, MD  diclofenac sodium (VOLTAREN) 1 % GEL Apply to knee once per day 02/01/19   Leeanne Rio, MD  fesoterodine (TOVIAZ) 8 MG TB24 tablet TAKE 1 TABLET (8MG  TOTAL) BY MOUTH DAILY. Patient taking differently: Take 8 mg by mouth at bedtime. TAKE 1 TABLET (8MG  TOTAL) BY MOUTH DAILY. 11/26/18   Leeanne Rio, MD  furosemide (LASIX) 20 MG tablet Take 20 mg by mouth daily as needed for fluid.    [provider]  gabapentin (NEURONTIN) 100 MG capsule Take 1 capsule (100 mg total) by mouth daily as needed (neuropathy). 02/01/19   Leeanne Rio, MD  Iron-Vitamins (GERITOL COMPLETE PO)  Take 1 tablet by mouth daily. Geritol Women's Complete    [provider]  lidocaine-prilocaine (EMLA) cream Apply 1 application topically as needed (prior to port being accessed).    [provider]  methocarbamol (ROBAXIN) 750 MG tablet Take 1 tablet (750 mg total) by mouth 3 (three) times daily as needed for muscle spasms. 11/09/18   Thurman Coyer, DO  omeprazole (PRILOSEC) 40 MG capsule Take twice daily, 1 before breakfast and 1 before supper. Patient taking differently: Take 40 mg by mouth 2 (two) times daily. Take twice daily, 1 before breakfast and 1 before supper. 11/24/18   [name], MD  ORAL ELECTROLYTES PO Take 20-40 drops by mouth daily as needed (low electrolytes).    [provider]  rivaroxaban (XARELTO) 20 MG TABS tablet Take 1 tablet (20 mg total) by mouth every morning. 09/03/18   Alveda Reasons, MD  triamcinolone cream (KENALOG) 0.1 % Apply 1 application topically 2 (two) times daily as needed (skin irritation). APPLY TO THE AFFECTED AREA(S) TWICE DAILY AS NEEDED 02/01/19   Leeanne Rio, MD    Family History Family History  Problem Relation Age of Onset  . Diabetes Mother   . Hypertension Mother   . Hyperlipidemia Mother   . Colon polyps Mother        4 total  . Fibroids Mother        s/p  hysterectomy  . Hyperlipidemia Father   . Diabetes Daughter   . Hodgkin's lymphoma Daughter 53  . Asthma Maternal Aunt        severe - d. 24  . Prostate cancer Maternal Uncle 35  . Diabetes Paternal Aunt   . Diabetes Paternal Uncle   . Kidney failure Maternal Grandmother 84  . Pancreatic cancer Paternal Grandmother        dx. >50  . Diabetes Paternal Grandfather   . Diabetes Paternal Aunt   . Heart disease Neg Hx   . Stroke Neg Hx   . Colon cancer Neg Hx   . Stomach cancer Neg Hx   . Esophageal cancer Neg Hx   . Rectal cancer Neg Hx     Social History Social History   Tobacco Use  . Smoking status: Never Smoker  . Smokeless tobacco: Never Used  Substance Use Topics  . Alcohol use: Yes    Comment: Maybe wine every 6 months  . Drug use: No     Allergies   Penicillins   Review of Systems Review of Systems  Constitutional: Positive for chills and fever.  HENT: Negative.   Respiratory: Positive for cough and shortness of breath.   Cardiovascular: Negative for chest pain, palpitations and leg swelling.  Gastrointestinal: Positive for vomiting (Post-tussive). Negative for abdominal pain, diarrhea and nausea.  Musculoskeletal: Positive for myalgias. Negative for arthralgias and joint swelling.  Skin: Negative for color change and rash.  Neurological: Negative for dizziness, syncope and light-headedness.  All other systems reviewed and are negative.    Physical Exam Updated Vital Signs BP (!) 161/95   Pulse 88   Temp 99.3 F (37.4 C)   Resp 18   Ht 5' 5.5" (1.664 m)   Wt (!) 175.5 kg   LMP 10/26/2013   SpO2 99%   BMI 63.42 kg/m   Physical Exam Vitals signs and nursing note reviewed.  Constitutional:      General: She is not in acute distress.    Appearance: Normal appearance. She is well-developed. She is  not ill-appearing or diaphoretic.     Comments: Morbidly obese, well-appearing and in no acute distress.  HENT:     Head: Normocephalic and  atraumatic.     Mouth/Throat:     Mouth: Mucous membranes are moist.     Pharynx: Oropharynx is clear.  Eyes:     General:        Right eye: No discharge.        Left eye: No discharge.  Neck:     Musculoskeletal: Neck supple.  Cardiovascular:     Rate and Rhythm: Normal rate and regular rhythm.     Heart sounds: Normal heart sounds. No murmur. No friction rub. No gallop.   Pulmonary:     Effort: Pulmonary effort is normal. No respiratory distress.     Breath sounds: Normal breath sounds. No wheezing or rales.     Comments: Respirations equal and unlabored, patient able to speak in full sentences, lungs clear with some decreased air movement, auscultation limited due to body habitus Abdominal:     General: Bowel sounds are normal. There is no distension.     Palpations: Abdomen is soft. There is no mass.     Tenderness: There is no abdominal tenderness. There is no guarding.     Comments: Abdomen soft, nondistended, nontender to palpation in all quadrants without guarding or peritoneal signs  Musculoskeletal:        General: No deformity.     Comments: Mild tenderness over the right knee and inner thigh without any palpable cord or swelling, no erythema or warmth.  Distal pulses intact.  Skin:    General: Skin is warm and dry.     Capillary Refill: Capillary refill takes less than 2 seconds.  Neurological:     Mental Status: She is alert.     Coordination: Coordination normal.     Comments: Speech is clear, able to follow commands Moves extremities without ataxia, coordination intact  Psychiatric:        Mood and Affect: Mood normal.        Behavior: Behavior normal.      ED Treatments / Results  Labs (all labs ordered are listed, but only abnormal results are displayed) Labs Reviewed  CBC WITH DIFFERENTIAL/PLATELET - Abnormal; Notable for the following components:      Result Value   WBC 11.9 (*)    Hemoglobin 11.0 (*)    HCT 35.5 (*)    Neutro Abs 10.2 (*)    All  other components within normal limits  COMPREHENSIVE METABOLIC PANEL - Abnormal; Notable for the following components:   Glucose, Bld 122 (*)    Creatinine, Ser 1.13 (*)    AST 13 (*)    GFR calc non Af Amer 55 (*)    All other components within normal limits  SARS CORONAVIRUS 2 (TAT 6-24 HRS)  CULTURE, BLOOD (ROUTINE X 2)  CULTURE, BLOOD (ROUTINE X 2)  LACTIC ACID, PLASMA  D-DIMER, QUANTITATIVE (NOT AT [facility])  PROCALCITONIN  LACTATE DEHYDROGENASE  FERRITIN  TRIGLYCERIDES  FIBRINOGEN  C-REACTIVE PROTEIN    EKG EKG Interpretation  Date/Time:  Saturday February 19 2019 07:52:10 EST Ventricular Rate:  93 PR Interval:    QRS Duration: 77 QT Interval:  375 QTC Calculation: 467 R Axis:   -3 Text Interpretation: Sinus rhythm Confirmed by Nat Christen [phone]) on 02/19/2019 9:21:57 AM   Radiology Dg Chest 2 View  Result Date: 02/19/2019 CLINICAL DATA:  Cough. Fever. Small amount of hemoptysis. EXAM: CHEST -  2 VIEW COMPARISON:  02/01/2019 FINDINGS: Interval extensive patchy airspace opacity throughout the majority of the left lung. No pleural fluid seen. Clear right lung. Unremarkable bones. Stable right jugular porta catheter. IMPRESSION: Interval extensive left lung pneumonia. Electronically Signed   By: Claudie Revering M.D.   On: 02/19/2019 08:07    Procedures Procedures (including critical care time)  Medications Ordered in ED Medications  cefTRIAXone (ROCEPHIN) 1 g in sodium chloride 0.9 % 100 mL IVPB (has no administration in time range)  azithromycin (ZITHROMAX) 500 mg in sodium chloride 0.9 % 250 mL IVPB (has no administration in time range)  guaiFENesin (ROBITUSSIN) 100 MG/5ML solution 200 mg (200 mg Oral Given 02/19/19 0726)  albuterol (VENTOLIN HFA) 108 (90 Base) MCG/ACT inhaler 2 puff (2 puffs Inhalation Given 02/19/19 0727)     Initial Impression / Assessment and Plan / ED Course  I have reviewed the triage vital signs and the nursing notes.  Pertinent labs &  imaging results that were available during my care of the patient were reviewed by me and considered in my medical decision making (see chart for details).  54 year old female presents with fever and cough as well as right lower extremity pain.  History of multiple DVTs, is on Xarelto and has been compliant and not missed any doses, has chronic knee pain and at first thought pain was related to that but she is worried she could be developing another blood clot.  She also began having cough and chills yesterday and noted to have a fever of 101.2 last night, took Tylenol prior to arrival.  She is unsure of any sick contacts.  Reports she did cough up a little bit of blood-tinged sputum.  Reports history of similar symptoms in the past when she had a bacterial pneumonia and required admission to the hospital.  Unsure of any Covid contact.  No other URI or GI symptoms.  Overall well-appearing.  Will get basic labs, lactic acid, chest x-ray, EKG.  Will order DVT study of the right lower extremity.  No obvious evidence of DVT or cellulitis on exam.  Chest x-ray with evidence of left-sided pneumonia.  Patient has mild leukocytosis of 11.9, no significant electrolyte derangements, creatinine of 1.13, which is at baseline, normal LFTs.  Lactic acid is not elevated.  EKG is unremarkable.  DVT studies negative.  Given chest x-ray with extensive left-sided pneumonia, I feel this is more likely a bacterial pneumonia than Covid, but Covid test was sent as well.  Patient ambulated in the department and came tachycardic to the 120s and O2 sats dropped to 83%, patient seated in improving but still with increased work of breathing, placed on 2 L nasal cannula.  Will admit for pneumonia and hypoxia.  Will get blood cultures as well as inflammatory markers for potential Covid but given appearance of chest x-ray I am more concerned for bacterial pneumonia, started on Rocephin and azithromycin.  Will consult for hospitalist  admission.  Case discussed with Dr. Andria Frames with [facility] who will see and admit the patient.  Final Clinical Impressions(s) / ED Diagnoses   Final diagnoses:  Pneumonia of left lung due to infectious organism, unspecified part of lung  Right leg pain    ED Discharge Orders    None       Jacqlyn Larsen, [place] 02/19/19 1023    Fatima Blank, MD 02/20/19 [phone]

## 2019-02-19 NOTE — ED Triage Notes (Addendum)
Patient here from home with complaints of right leg thigh pain. Reports hx of blood clots on Xarelto. Also reports fever since 1am this morning, Tylenol around 3am. Also reports cough since Feb with " a little blood".

## 2019-02-19 NOTE — Progress Notes (Signed)
Patient was pended to someone else before pending to [address].  It was only 10 minutes when the nurse from ED called to give report to [place] per the charge nurse on [place].

## 2019-02-20 DIAGNOSIS — J189 Pneumonia, unspecified organism: Secondary | ICD-10-CM

## 2019-02-20 DIAGNOSIS — N3281 Overactive bladder: Secondary | ICD-10-CM

## 2019-02-20 DIAGNOSIS — I5032 Chronic diastolic (congestive) heart failure: Secondary | ICD-10-CM

## 2019-02-20 DIAGNOSIS — M159 Polyosteoarthritis, unspecified: Secondary | ICD-10-CM

## 2019-02-20 DIAGNOSIS — D638 Anemia in other chronic diseases classified elsewhere: Secondary | ICD-10-CM

## 2019-02-20 DIAGNOSIS — Z8543 Personal history of malignant neoplasm of ovary: Secondary | ICD-10-CM

## 2019-02-20 LAB — CBC WITH DIFFERENTIAL/PLATELET
Abs Immature Granulocytes: 0.03 10*3/uL (ref 0.00–0.07)
Basophils Absolute: 0 10*3/uL (ref 0.0–0.1)
Basophils Relative: 0 %
Eosinophils Absolute: 0.1 10*3/uL (ref 0.0–0.5)
Eosinophils Relative: 2 %
HCT: 30.9 % — ABNORMAL LOW (ref 36.0–46.0)
Hemoglobin: 9.3 g/dL — ABNORMAL LOW (ref 12.0–15.0)
Immature Granulocytes: 0 %
Lymphocytes Relative: 21 %
Lymphs Abs: 2 10*3/uL (ref 0.7–4.0)
MCH: 27.9 pg (ref 26.0–34.0)
MCHC: 30.1 g/dL (ref 30.0–36.0)
MCV: 92.8 fL (ref 80.0–100.0)
Monocytes Absolute: 0.9 10*3/uL (ref 0.1–1.0)
Monocytes Relative: 9 %
Neutro Abs: 6.2 10*3/uL (ref 1.7–7.7)
Neutrophils Relative %: 68 %
Platelets: 155 10*3/uL (ref 150–400)
RBC: 3.33 MIL/uL — ABNORMAL LOW (ref 3.87–5.11)
RDW: 14.1 % (ref 11.5–15.5)
WBC: 9.2 10*3/uL (ref 4.0–10.5)
nRBC: 0 % (ref 0.0–0.2)

## 2019-02-20 LAB — BASIC METABOLIC PANEL
Anion gap: 7 (ref 5–15)
BUN: 16 mg/dL (ref 6–20)
CO2: 26 mmol/L (ref 22–32)
Calcium: 8.5 mg/dL — ABNORMAL LOW (ref 8.9–10.3)
Chloride: 107 mmol/L (ref 98–111)
Creatinine, Ser: 1.15 mg/dL — ABNORMAL HIGH (ref 0.44–1.00)
GFR calc Af Amer: >60 mL/min (ref >60)
GFR calc non Af Amer: 54 mL/min — ABNORMAL LOW (ref >60)
Glucose, Bld: 109 mg/dL — ABNORMAL HIGH (ref 70–99)
Potassium: 4 mmol/L (ref 3.5–5.1)
Sodium: 140 mmol/L (ref 135–145)

## 2019-02-20 LAB — STREP PNEUMONIAE URINARY ANTIGEN: Strep Pneumo Urinary Antigen: NEGATIVE

## 2019-02-20 LAB — MRSA PCR SCREENING: MRSA by PCR: NEGATIVE

## 2019-02-20 MED ORDER — FLUTICASONE PROPIONATE 50 MCG/ACT NA SUSP
1.0000 | Freq: Every day | NASAL | Status: DC
  Administered 2019-02-20: 1 via NASAL
  Filled 2019-02-20: qty 16

## 2019-02-20 MED ORDER — SALINE SPRAY 0.65 % NA SOLN
1.0000 | NASAL | Status: DC | PRN
  Filled 2019-02-20: qty 44

## 2019-02-20 MED ORDER — GUAIFENESIN ER 600 MG PO TB12
600.0000 mg | ORAL_TABLET | Freq: Two times a day (BID) | ORAL | Status: DC
  Administered 2019-02-20 – 2019-02-21 (×2): 600 mg via ORAL
  Filled 2019-02-20 (×2): qty 1

## 2019-02-20 MED ORDER — BUTALBITAL-APAP-CAFFEINE 50-325-40 MG PO TABS
1.0000 | ORAL_TABLET | Freq: Three times a day (TID) | ORAL | Status: DC
  Administered 2019-02-20 – 2019-02-21 (×3): 1 via ORAL
  Filled 2019-02-20 (×3): qty 1

## 2019-02-20 NOTE — Evaluation (Signed)
Physical Therapy Evaluation Patient Details Name: [name] MRN: [id number] DOB: [birth year] Today's Date: 02/20/2019   History of Present Illness  53 year old female with history of endometrial/ovarian cancer s/p chemoradiation, CKD-3, overactive bladder, DVT on Xarelto, bariatric surgery, anxiety, GERD, osteoarthritis, HTN and HLD presenting with fever, chills, shortness of breath and cough and right leg pain and admitted for sepsis and acute respiratory failure due to community-acquired pneumonia.  Clinical Impression  Pt admitted with above diagnosis.  Pt currently with functional limitations due to the deficits listed below (see PT Problem List). Pt will benefit from skilled PT to increase their independence and safety with mobility to allow discharge to the venue listed below.  Pt with no c/o SOB and o2 > 90% throughout gait in hallway.  Will follow acutely, but nor follow up PT needed at time of d/c.     Follow Up Recommendations No PT follow up    Equipment Recommendations  None recommended by PT    Recommendations for Other Services       Precautions / Restrictions Precautions Precautions: None Restrictions Weight Bearing Restrictions: No      Mobility  Bed Mobility Overal bed mobility: Modified Independent             General bed mobility comments: use of bed rail and HOB elevated  Transfers Overall transfer level: Needs assistance Equipment used: Straight cane Transfers: Sit to/from Stand Sit to Stand: Supervision         General transfer comment: S for safety  Ambulation/Gait Ambulation/Gait assistance: Min guard Gait Distance (Feet): 300 Feet Assistive device: Straight cane Gait Pattern/deviations: Step-through pattern;Wide base of support Gait velocity: decreased   General Gait Details: Amb on room air with o2 sats in mid 90's and HR up to 111.  Stairs            Wheelchair Mobility    Modified Rankin (Stroke Patients Only)        Balance Overall balance assessment: Needs assistance   Sitting balance-Leahy Scale: Good     Standing balance support: Single extremity supported Standing balance-Leahy Scale: Fair                               Pertinent Vitals/Pain Pain Assessment: Faces Faces Pain Scale: Hurts a little bit Pain Location: upper back Pain Descriptors / Indicators: Sore Pain Intervention(s): Monitored during session    Home Living Family/patient expects to be discharged to:: Private residence Living Arrangements: Parent Available Help at Discharge: Family;Available 24 hours/day Type of Home: House Home Access: Stairs to enter   [employer] of Steps: 2 Home Layout: One level Home Equipment: Cane - single point;Grab bars - tub/shower;Walker - 2 wheels      Prior Function Level of Independence: Independent with assistive device(s)         Comments: Amb with cane most of the time and occasionally use of RW     Hand Dominance        Extremity/Trunk Assessment                Communication   Communication: No difficulties  Cognition Arousal/Alertness: Awake/alert Behavior During Therapy: WFL for tasks assessed/performed Overall Cognitive Status: Within Functional Limits for tasks assessed  General Comments      Exercises     Assessment/Plan    PT Assessment Patient needs continued PT services  PT Problem List Decreased balance;Decreased mobility;Decreased activity tolerance       PT Treatment Interventions DME instruction;Gait training;Stair training;Functional mobility training;Therapeutic activities;Balance training;Therapeutic exercise    PT Goals (Current goals can be found in the Care Plan section)  Acute Rehab PT Goals Patient Stated Goal: home PT Goal Formulation: With patient Time For Goal Achievement: 03/06/19 Potential to Achieve Goals: Good    Frequency Min  3X/week   Barriers to discharge        Co-evaluation               AM-PAC PT "6 Clicks" Mobility  Outcome Measure Help needed turning from your back to your side while in a flat bed without using bedrails?: None Help needed moving from lying on your back to sitting on the side of a flat bed without using bedrails?: None Help needed moving to and from a bed to a chair (including a wheelchair)?: A Little Help needed standing up from a chair using your arms (e.g., wheelchair or bedside chair)?: A Little Help needed to walk in hospital room?: A Little Help needed climbing 3-5 steps with a railing? : A Little 6 Click Score: 20    End of Session Equipment Utilized During Treatment: Gait belt Activity Tolerance: Patient tolerated treatment well Patient left: in chair Nurse Communication: Mobility status PT Visit Diagnosis: Difficulty in walking, not elsewhere classified (R26.2)    Time: [id number] PT Time Calculation (min) (ACUTE ONLY): 22 min   Charges:   PT Evaluation $PT Eval Low Complexity: 1 Low          [name], [place] Pager [phone] 02/20/2019   [name] 02/20/2019, 3:40 PM

## 2019-02-20 NOTE — Progress Notes (Signed)
PROGRESS NOTE  [name] [id number] DOB: 04/29/1965   PCP: Leeanne Rio, MD  Patient is from: Home.  Uses cane for ambulation at baseline  DOA: 02/19/2019 LOS: 1  Brief Narrative / Interim history: 53 year old female with history of endometrial/ovarian cancer s/p chemoradiation, CKD-3, overactive bladder, DVT on Xarelto, bariatric surgery, anxiety, GERD, osteoarthritis, HTN and HLD presenting with fever, chills, shortness of breath and cough and right leg pain and admitted for sepsis and acute respiratory failure due to community-acquired pneumonia.  In ED, HDS.  Good saturation on room air.  CMP not impressive.  WBC 12.  Hgb 11.  COVID-19 negative.  CXR with interval extensive left lung pneumonia.  EKG NSR without acute ischemic finding or abnormal interval. D-dimer negative.  Procalcitonin 0.27.  Right lower extremity Doppler negative for DVT.  Subjective: Patient reports some improvement in her breathing.  Continues to have productive cough with yellowish phlegm but no further hemoptysis.  Denies nausea, vomiting or UTI symptoms.  Right lower extremity pain improved.  She says she feels weak and anxious about going home.  Objective: Vitals:   02/19/19 1200 02/19/19 1647 02/19/19 1947 02/20/19 0539  BP: 137/80 (!) 149/126 (!) 158/76 133/66  Pulse:  80 79 84  Resp: (!) 23 18 20 18   Temp:  99.2 F (37.3 C) 98.3 F (36.8 C) 98.7 F (37.1 C)  TempSrc:  Oral Oral Oral  SpO2:  99% 100% 99%  Weight:      Height:        Intake/Output Summary (Last 24 hours) at 02/20/2019 1500 Last data filed at 02/19/2019 1800 Gross per 24 hour  Intake 250.09 ml  Output -  Net 250.09 ml   Filed Weights   02/19/19 0637  Weight: (!) 175.5 kg    Examination:  GENERAL: No acute distress.  Appears well.  HEENT: MMM.  Vision and hearing grossly intact.  NECK: Supple.  No apparent JVD.  RESP:  No IWOB. Good air movement bilaterally. CVS:  RRR. Heart sounds normal.   ABD/GI/GU: Bowel sounds present. Soft. Non tender.  MSK/EXT:  No apparent deformity or edema. Moves extremities. SKIN: no apparent skin lesion or wound NEURO: Awake, alert and oriented appropriately.  No gross deficit.  PSYCH: Calm. Normal affect.  Assessment & Plan: Community-acquired pneumonia: sepsis ruled out.  Hemodynamically stable.  Cultures negative. -CXR with extensive left lower lobe pneumonia -Still with significant dyspnea and productive cough and anxious about going home yet -Continue ceftriaxone and azithromycin 11/14-- -trend procalcitonin -As needed breathing treatments, mucolytic's and incentive spirometry -OOB/PT/OT eval  Chronic diastolic CHF/HTN: Echo in 11/2016 with EF of 60 to 65%, G1 DD but no other significant finding.  Appears euvolemic. -Continue home Lasix -Monitor fluid status  History of endometrial/ovarian cancer s/p surgery and chemoradiation now in remission -Outpatient follow-up  Lower extremity DVT likely due to malignancy-reportedly lupus work-up negative -Continue home Xarelto  Osteoarthritis/cervical radiculopathy -Continue Norco, Robaxin, EMLA and Voltaren gel  Morbid obesity: BMI 63.42. -Encourage lifestyle change to lose weight  GERD -Continue PPI  Overactive bladder -Continue Toviaz  CKD-3: Stable -Continue monitoring  Anemia of chronic disease: Baseline Hgb 11-12.  Slight drop from admission likely dilutional -Hgb 11.0 (admit)> 9.3 -Check anemia panel -Continue iron and monitoring  Anxiety: Does not seem to be medication -Continue home gabapentin  Headache/sinus pressure -Antibiotic as above -As needed Fioricet, saline nose spray and clonus                DVT  prophylaxis: On Xarelto for DVT Code Status: Full code Family Communication: Patient and/or RN. Available if any question. Disposition Plan: Remains inpatient.  Endorses significant dyspnea and anxious about going home.  Still with productive cough  Consultants: None  Procedures:  None  Microbiology summarized: COVID-19 screen negative Blood cultures negative so far  Sch Meds:  Scheduled Meds: . butalbital-acetaminophen-caffeine  1 tablet Oral Q8H  . fesoterodine  8 mg Oral QHS  . fluticasone  1 spray Each Nare Daily  . loratadine  10 mg Oral Daily  . pantoprazole  40 mg Oral BID  . rivaroxaban  20 mg Oral Q supper   Continuous Infusions: . azithromycin 500 mg (02/20/19 1242)  . cefTRIAXone (ROCEPHIN)  IV 2 g (02/20/19 1048)   PRN Meds:.acetaminophen, albuterol, diclofenac sodium, furosemide, gabapentin, HYDROcodone-acetaminophen, lidocaine-prilocaine, methocarbamol, sodium chloride, triamcinolone cream  Antimicrobials: Anti-infectives (From admission, onward)   Start     Dose/Rate Route Frequency Ordered Stop   02/20/19 1300  azithromycin (ZITHROMAX) 500 mg in sodium chloride 0.9 % 250 mL IVPB     500 mg 250 mL/hr over 60 Minutes Intravenous Every 24 hours 02/19/19 1236 02/25/19 1259   02/20/19 1100  cefTRIAXone (ROCEPHIN) 2 g in sodium chloride 0.9 % 100 mL IVPB     2 g 200 mL/hr over 30 Minutes Intravenous Every 24 hours 02/19/19 1236 02/25/19 1059   02/19/19 1245  cefTRIAXone (ROCEPHIN) 1 g in sodium chloride 0.9 % 100 mL IVPB     1 g 200 mL/hr over 30 Minutes Intravenous  Once 02/19/19 1242 02/19/19 1445   02/19/19 0945  cefTRIAXone (ROCEPHIN) 1 g in sodium chloride 0.9 % 100 mL IVPB     1 g 200 mL/hr over 30 Minutes Intravenous  Once 02/19/19 0939 02/19/19 1140   02/19/19 0945  azithromycin (ZITHROMAX) 500 mg in sodium chloride 0.9 % 250 mL IVPB     500 mg 250 mL/hr over 60 Minutes Intravenous  Once 02/19/19 0939 02/19/19 1344       I have personally reviewed the following labs and images: CBC: Recent Labs  Lab 02/19/19 0816 02/20/19 0511  WBC 11.9* 9.2  NEUTROABS 10.2* 6.2  HGB 11.0* 9.3*  HCT 35.5* 30.9*  MCV 91.5 92.8  PLT 173 155   BMP &GFR Recent Labs  Lab 02/19/19 0816 02/20/19 0511   NA 140 140  K 4.4 4.0  CL 108 107  CO2 24 26  GLUCOSE 122* 109*  BUN 19 16  CREATININE 1.13* 1.15*  CALCIUM 8.9 8.5*   Estimated Creatinine Clearance: 92.8 mL/min (A) (by C-G formula based on SCr of 1.15 mg/dL (H)). Liver & Pancreas: Recent Labs  Lab 02/19/19 0816  AST 13*  ALT 9  ALKPHOS 91  BILITOT 0.5  PROT 6.8  ALBUMIN 3.5   No results for input(s): LIPASE, AMYLASE in the last 168 hours. No results for input(s): AMMONIA in the last 168 hours. Diabetic: No results for input(s): HGBA1C in the last 72 hours. No results for input(s): GLUCAP in the last 168 hours. Cardiac Enzymes: No results for input(s): CKTOTAL, CKMB, CKMBINDEX, TROPONINI in the last 168 hours. No results for input(s): PROBNP in the last 8760 hours. Coagulation Profile: No results for input(s): INR, PROTIME in the last 168 hours. Thyroid Function Tests: No results for input(s): TSH, T4TOTAL, FREET4, T3FREE, THYROIDAB in the last 72 hours. Lipid Profile: Recent Labs    02/19/19 1111  TRIG 64   Anemia Panel: Recent Labs    02/19/19 1111  FERRITIN 33   Urine analysis:    Component Value Date/Time   COLORURINE AMBER (A) 03/14/2015 2118   APPEARANCEUR CLOUDY (A) 03/14/2015 2118   LABSPEC 1.015 04/23/2015 1247   PHURINE 5.0 04/23/2015 1247   PHURINE 5.5 03/14/2015 2118   GLUCOSEU Negative 04/23/2015 1247   HGBUR Negative 04/23/2015 1247   HGBUR MODERATE (A) 03/14/2015 2118   BILIRUBINUR Color Interference 04/23/2015 1247   KETONESUR Negative 04/23/2015 1247   KETONESUR NEGATIVE 03/14/2015 2118   PROTEINUR Color Interference 04/23/2015 1247   PROTEINUR NEGATIVE 03/14/2015 2118   UROBILINOGEN Color Interference 04/23/2015 1247   NITRITE Color Interference 04/23/2015 1247   NITRITE NEGATIVE 03/14/2015 2118   LEUKOCYTESUR Color Interference 04/23/2015 1247   Sepsis Labs: Invalid input(s): PROCALCITONIN, [place]  Microbiology: Recent Results (from the past 240 hour(s))  SARS  CORONAVIRUS 2 (TAT 6-24 HRS) Nasopharyngeal Nasopharyngeal Swab     Status: None   Collection Time: 02/19/19  7:29 AM   Specimen: Nasopharyngeal Swab  Result Value Ref Range Status   SARS Coronavirus 2 NEGATIVE NEGATIVE Final    Comment: (NOTE) SARS-CoV-2 target nucleic acids are NOT DETECTED. The SARS-CoV-2 RNA is generally detectable in upper and lower respiratory specimens during the acute phase of infection. Negative results do not preclude SARS-CoV-2 infection, do not rule out co-infections with other pathogens, and should not be used as the sole basis for treatment or other patient management decisions. Negative results must be combined with clinical observations, patient history, and epidemiological information. The expected result is Negative. Fact Sheet for Patients: SugarRoll.be Fact Sheet for Healthcare Providers: https://www.woods-mathews.com/ This test is not yet approved or cleared by the [place] FDA and  has been authorized for detection and/or diagnosis of SARS-CoV-2 by FDA under an Emergency Use Authorization (EUA). This EUA will remain  in effect (meaning this test can be used) for the duration of the COVID-19 declaration under Section 56 4(b)(1) of the Act, 21 U.S.C. section 360bbb-3(b)(1), unless the authorization is terminated or revoked sooner. Performed at [facility], [place] [address] [place], [place] [address]   Culture, blood (routine x 2)     Status: None (Preliminary result)   Collection Time: 02/19/19 11:02 AM   Specimen: BLOOD  Result Value Ref Range Status   Specimen Description   Final    BLOOD PORTA CATH Performed at [facility] [address] [place], [place] [address]    Special Requests   Final    BOTTLES DRAWN AEROBIC AND ANAEROBIC Blood Culture adequate volume   Culture   Final    NO GROWTH < 24 HOURS Performed at [facility], [place] [address]  [place], [place] [address]    Report Status PENDING  Incomplete  Culture, blood (routine x 2)     Status: None (Preliminary result)   Collection Time: 02/19/19 11:11 AM   Specimen: BLOOD  Result Value Ref Range Status   Specimen Description   Final    BLOOD RIGHT ANTECUBITAL Performed at [facility] [address] [place], [place] [address]    Special Requests   Final    BOTTLES DRAWN AEROBIC AND ANAEROBIC Blood Culture results may not be optimal due to an excessive volume of blood received in culture bottles   Culture   Final    NO GROWTH < 24 HOURS Performed at [facility] [address] [place], [place] [address]    Report Status PENDING  Incomplete  Radiology Studies: No results found.  35 minutes with more than 50% spent in reviewing records, counseling patient/family and coordinating care.    T. Rankin  If 7PM-7AM, please contact night-coverage [email or web address] Password [email or web address] 02/20/2019, 3:00 PM

## 2019-02-20 NOTE — Plan of Care (Signed)

## 2019-02-21 DIAGNOSIS — N1831 Chronic kidney disease, stage 3a: Secondary | ICD-10-CM

## 2019-02-21 DIAGNOSIS — Z86718 Personal history of other venous thrombosis and embolism: Secondary | ICD-10-CM

## 2019-02-21 LAB — BASIC METABOLIC PANEL
Anion gap: 8 (ref 5–15)
BUN: 17 mg/dL (ref 6–20)
CO2: 25 mmol/L (ref 22–32)
Calcium: 8.6 mg/dL — ABNORMAL LOW (ref 8.9–10.3)
Chloride: 108 mmol/L (ref 98–111)
Creatinine, Ser: 1.29 mg/dL — ABNORMAL HIGH (ref 0.44–1.00)
GFR calc Af Amer: 54 mL/min — ABNORMAL LOW (ref >60)
GFR calc non Af Amer: 47 mL/min — ABNORMAL LOW (ref >60)
Glucose, Bld: 103 mg/dL — ABNORMAL HIGH (ref 70–99)
Potassium: 4 mmol/L (ref 3.5–5.1)
Sodium: 141 mmol/L (ref 135–145)

## 2019-02-21 LAB — CBC
HCT: 30.7 % — ABNORMAL LOW (ref 36.0–46.0)
Hemoglobin: 9.4 g/dL — ABNORMAL LOW (ref 12.0–15.0)
MCH: 28.1 pg (ref 26.0–34.0)
MCHC: 30.6 g/dL (ref 30.0–36.0)
MCV: 91.9 fL (ref 80.0–100.0)
Platelets: 168 10*3/uL (ref 150–400)
RBC: 3.34 MIL/uL — ABNORMAL LOW (ref 3.87–5.11)
RDW: 13.8 % (ref 11.5–15.5)
WBC: 8.3 10*3/uL (ref 4.0–10.5)
nRBC: 0 % (ref 0.0–0.2)

## 2019-02-21 LAB — RESPIRATORY PANEL BY PCR

## 2019-02-21 MED ORDER — HEPARIN SOD (PORK) LOCK FLUSH 100 UNIT/ML IV SOLN
500.0000 [IU] | Freq: Once | INTRAVENOUS | Status: AC
  Administered 2019-02-21: 11:00:00 500 [IU]
  Filled 2019-02-21: qty 5

## 2019-02-21 MED ORDER — GUAIFENESIN ER 600 MG PO TB12: 600.0000 mg | ORAL_TABLET | Freq: Two times a day (BID) | ORAL | 0 refills | Status: DC

## 2019-02-21 MED ORDER — AZITHROMYCIN 250 MG PO TABS: 250.0000 mg | ORAL_TABLET | Freq: Every day | ORAL | 0 refills | Status: DC

## 2019-02-21 MED ORDER — CEFPODOXIME PROXETIL 200 MG PO TABS: 200.0000 mg | ORAL_TABLET | Freq: Two times a day (BID) | ORAL | 0 refills | Status: DC

## 2019-02-21 NOTE — Discharge Summary (Signed)
Physician Discharge Summary  [name] [id number] DOB: Jul 21, 1965 DOA: 02/19/2019  PCP: Leeanne Rio, MD  Admit date: 02/19/2019 Discharge date: 02/21/2019  Admitted From: None Disposition: None  Recommendations for Outpatient Follow-up:  1. Follow up with PCP in 1-2 weeks 2. Please obtain CBC/BMP/Mag at follow up 3. Please follow up on the following pending results: None  Home Health: None required Equipment/Devices: None required  Discharge Condition: Stable CODE STATUS: Full code  Follow-up Information    Leeanne Rio, MD. Schedule an appointment as soon as possible for a visit in 1 week(s).   Specialty: Family Medicine Contact information: [place] [address] [phone]           Hospital Course: 53 year old female with history of endometrial/ovarian cancer s/p chemoradiation, CKD-3, overactive bladder, DVT on Xarelto, bariatric surgery, anxiety, GERD, osteoarthritis, HTN and HLD presenting with fever, chills, shortness of breath and cough and right leg pain and admitted for sepsis and acute respiratory failure due to community-acquired pneumonia.  In ED, HDS.  Good saturation on room air.  CMP not impressive.  WBC 12.  Hgb 11.  COVID-19 negative.  CXR with interval extensive left lung pneumonia.  EKG NSR without acute ischemic finding or abnormal interval. D-dimer negative.  Procalcitonin 0.27.  Right lower extremity Doppler negative for DVT.  Symptoms improved.  Evaluated by PT/OT and no needles identified.  Ambulated on room air without desaturation.  Discharged home on azithromycin and cefpodoxime to complete treatment course.  See individual problem list below for more hospital course.  Discharge Diagnoses:  Community-acquired pneumonia: sepsis ruled out.  Patient had no sepsis physiology.  Hemodynamically stable.  Cultures negative. -CXR with extensive left lower lobe pneumonia -Still with significant  dyspnea and productive cough and anxious about going home yet -Azithromycin 11/14-1118 -Ceftriaxone 11/14-11/16.  Cefpodoxime 200 mg twice daily 11/16-11/19 -Follow-up with PCP in 1 to 2 weeks  Chronic diastolic CHF/HTN: Echo in 11/2016 with EF of 60 to 65%, G1-DD but no other significant finding.  Appears euvolemic.  Slight uptrend in serum creatinine. -Advised to take a break from her Lasix for 2 to 3 days -Recheck BMP at follow-up  CKD-3: Slight uptrend in serum creatinine -Recheck BMP at follow-up -Advised to hold off Lasix for 2 to 3 days  Anemia of chronic disease: Baseline Hgb 11-12.  Slight drop from admission likely dilutional -Hgb 11.0 (admit)> 9.3> 9.4  History of endometrial/ovarian cancer s/p surgery and chemoradiation now in remission -Outpatient follow-up  Recurrent LE DVT likely due to malignancy-reportedly lupus work-up negative.  RLE Doppler negative for DVT -Continue home Xarelto  Osteoarthritis/cervical radiculopathy -Continue home medication  Morbid obesity: BMI 63.42. -Encourage lifestyle change to lose weight  GERD -Continue PPI  Overactive bladder -Continue Toviaz  Anxiety: does not seem to be medication -Continue home gabapentin  Headache/sinus pressure: Resolved -Covered with antibiotics as above -Continue home antihistamine   Discharge Instructions  Discharge Instructions    Call MD for:  difficulty breathing, headache or visual disturbances   Complete by: As directed    Call MD for:  extreme fatigue   Complete by: As directed    Call MD for:  persistant dizziness or light-headedness   Complete by: As directed    Call MD for:  persistant nausea and vomiting   Complete by: As directed    Call MD for:  temperature >100.4   Complete by: As directed    Diet - low sodium heart healthy  Complete by: As directed    Discharge instructions   Complete by: As directed    It has been a pleasure taking care of you! You were  hospitalized with pneumonia (lung infection).  You were treated with antibiotics.  With that, your symptoms improved to the point we think it is safe to let you go home and follow-up with your primary care doctor.  We are discharging you more antibiotics to complete treatment course which is very important to treat the infection completely. Please review your new medication list and the directions before you take your medications.   Take care,   Increase activity slowly   Complete by: As directed      Allergies as of 02/21/2019      Reactions   Penicillins Rash   Has patient had a PCN reaction causing immediate rash, facial/tongue/throat swelling, SOB or lightheadedness with hypotension: no Has patient had a PCN reaction causing severe rash involving mucus membranes or skin necrosis: no Has patient had a PCN reaction that required hospitalization in the hospital at the time Has patient had a PCN reaction occurring within the last 10 years: no If all of the above answers are "NO", then may proceed with Cephalosporin use.      Medication List    TAKE these medications   acetaminophen 500 MG tablet Commonly known as: TYLENOL Take 1,000 mg by mouth every 6 (six) hours as needed for moderate pain.   albuterol (2.5 MG/3ML) 0.083% nebulizer solution Commonly known as: PROVENTIL Take 3 mLs (2.5 mg total) by nebulization every 4 (four) hours as needed for wheezing or shortness of breath. What changed: Another medication with the same name was changed. Make sure you understand how and when to take each.   albuterol 108 (90 Base) MCG/ACT inhaler Commonly known as: VENTOLIN HFA Inhale 2 puffs into the lungs every 6 (six) hours as needed for wheezing or shortness of breath. Please instruct patient in usage. What changed: additional instructions   azithromycin 250 MG tablet Commonly known as: Zithromax Take 1 tablet (250 mg total) by mouth daily. Start taking on: February 22, 2019    cefpodoxime 200 MG tablet Commonly known as: VANTIN Take 1 tablet (200 mg total) by mouth 2 (two) times daily.   cetirizine 10 MG tablet Commonly known as: ZYRTEC Take 1 tablet (10 mg total) by mouth daily as needed for allergies (itching).   diclofenac sodium 1 % Gel Commonly known as: Voltaren Apply to knee once per day What changed:   how much to take  how to take this  when to take this  reasons to take this  additional instructions   furosemide 20 MG tablet Commonly known as: LASIX Take 20 mg by mouth daily as needed for fluid.   gabapentin 100 MG capsule Commonly known as: NEURONTIN Take 1 capsule (100 mg total) by mouth daily as needed (neuropathy).   GERITOL COMPLETE PO Take 1 tablet by mouth once a week.   guaiFENesin 600 MG 12 hr tablet Commonly known as: MUCINEX Take 1 tablet (600 mg total) by mouth 2 (two) times daily.   lidocaine-prilocaine cream Commonly known as: EMLA Apply 1 application topically as needed (prior to port being accessed).   methocarbamol 750 MG tablet Commonly known as: ROBAXIN Take 1 tablet (750 mg total) by mouth 3 (three) times daily as needed for muscle spasms.   omeprazole 40 MG capsule Commonly known as: PRILOSEC Take twice daily, 1 before breakfast and 1 before  supper. What changed:   how much to take  how to take this  when to take this   rivaroxaban 20 MG Tabs tablet Commonly known as: Xarelto Take 1 tablet (20 mg total) by mouth every morning.   Toviaz 8 MG Tb24 tablet Generic drug: fesoterodine TAKE 1 TABLET (8MG  TOTAL) BY MOUTH DAILY. What changed:   how much to take  how to take this  when to take this   triamcinolone cream 0.1 % Commonly known as: KENALOG Apply 1 application topically 2 (two) times daily as needed (skin irritation). APPLY TO THE AFFECTED AREA(S) TWICE DAILY AS NEEDED What changed: additional instructions       Consultations:  None  Procedures/Studies:  2D Echo: None   Dg Chest 2 View  Result Date: 02/19/2019 CLINICAL DATA:  Cough. Fever. Small amount of hemoptysis. EXAM: CHEST - 2 VIEW COMPARISON:  02/01/2019 FINDINGS: Interval extensive patchy airspace opacity throughout the majority of the left lung. No pleural fluid seen. Clear right lung. Unremarkable bones. Stable right jugular porta catheter. IMPRESSION: Interval extensive left lung pneumonia. Electronically Signed   By: Claudie Revering M.D.   On: 02/19/2019 08:07   Dg Chest 2 View  Result Date: 02/01/2019 CLINICAL DATA:  Chronic cough. Submental lymphadenopathy. EXAM: CHEST - 2 VIEW COMPARISON:  Chest x-ray dated 05/29/2018 FINDINGS: The heart size and pulmonary vascularity are normal and the lungs are clear. Power port in place. The tip is just above the cavoatrial junction in excellent position. No effusions. No bone abnormality. Slight asymmetry of a soft tissues at the base of the neck, more prominent on the left than the right. IMPRESSION: No active cardiopulmonary disease. Power port in place. Possible adenopathy at the inferior aspect of the left side of the neck. Electronically Signed   By: Lorriane Shire M.D.   On: 02/01/2019 16:05   Vas [place] Lower Extremity Venous (dvt) (only Mc & Wl 7a-7p)  Result Date: 02/19/2019  Lower Venous Study Indications: Pain.  Risk Factors: Patient compliant with Xarelto. Limitations: Body habitus and depth of vessels. Comparison Study: Prior study from 11/23/16 is available for comparison Performing Technologist: Sharion Dove RVS  Examination Guidelines: A complete evaluation includes B-mode imaging, spectral Doppler, color Doppler, and power Doppler as needed of all accessible portions of each vessel. Bilateral testing is considered an integral part of a complete examination. Limited examinations for reoccurring indications may be performed as noted.  +---------+---------------+---------+-----------+----------+-------------------+ RIGHT     CompressibilityPhasicitySpontaneityPropertiesThrombus Aging      +---------+---------------+---------+-----------+----------+-------------------+ CFV      Full           Yes      Yes                                      +---------+---------------+---------+-----------+----------+-------------------+ SFJ      Full                                                             +---------+---------------+---------+-----------+----------+-------------------+ FV Prox                 Yes      Yes  patent by color and                                                       Doppler             +---------+---------------+---------+-----------+----------+-------------------+ FV Mid   Full                                                             +---------+---------------+---------+-----------+----------+-------------------+ FV Distal                                             patent by color     +---------+---------------+---------+-----------+----------+-------------------+ PFV                                                   Not visualized      +---------+---------------+---------+-----------+----------+-------------------+ POP                                                   patent by color and                                                       Doppler             +---------+---------------+---------+-----------+----------+-------------------+ PTV                                                   Not visualized      +---------+---------------+---------+-----------+----------+-------------------+ PERO                                                  Not visualized      +---------+---------------+---------+-----------+----------+-------------------+   Right Technical Findings: Not visualized segments include profunda, peroneal, posterior tibial.  Left Technical Findings: Left leg not evaluated.   Summary: Right: Findings  appear essentially unchanged compared to previous examination. There is no evidence of deep vein thrombosis proximal to the inguinal ligament or in the common femoral vein.  *See table(s) above for measurements and observations. Electronically signed by Monica Martinez MD on 02/19/2019 at 10:36:24 AM.    Final        Discharge Exam: Vitals:   02/20/19 2304 02/21/19 0443  BP: 137/77 124/80  Pulse: 91 86  Resp: 16 20  Temp: 98.3 F (36.8  C) 98.9 F (37.2 C)  SpO2: 97% 95%    GENERAL: No acute distress.  Appears well.  HEENT: MMM.  Vision and hearing grossly intact.  NECK: Supple.  No apparent JVD.  RESP:  No IWOB. Good air movement bilaterally. CVS:  RRR. Heart sounds normal.  ABD/GI/GU: Bowel sounds present. Soft. Non tender.  MSK/EXT:  Moves extremities. No apparent deformity or edema.  SKIN: no apparent skin lesion or wound NEURO: Awake, alert and oriented appropriately.  No gross deficit.  PSYCH: Calm. Normal affect.   The results of significant diagnostics from this hospitalization (including imaging, microbiology, ancillary and laboratory) are listed below for reference.     Microbiology: Recent Results (from the past 240 hour(s))  SARS CORONAVIRUS 2 (TAT 6-24 HRS) Nasopharyngeal Nasopharyngeal Swab     Status: None   Collection Time: 02/19/19  7:29 AM   Specimen: Nasopharyngeal Swab  Result Value Ref Range Status   SARS Coronavirus 2 NEGATIVE NEGATIVE Final    Comment: (NOTE) SARS-CoV-2 target nucleic acids are NOT DETECTED. The SARS-CoV-2 RNA is generally detectable in upper and lower respiratory specimens during the acute phase of infection. Negative results do not preclude SARS-CoV-2 infection, do not rule out co-infections with other pathogens, and should not be used as the sole basis for treatment or other patient management decisions. Negative results must be combined with clinical observations, patient history, and epidemiological information. The  expected result is Negative. Fact Sheet for Patients: SugarRoll.be Fact Sheet for Healthcare Providers: https://www.woods-mathews.com/ This test is not yet approved or cleared by the [place] FDA and  has been authorized for detection and/or diagnosis of SARS-CoV-2 by FDA under an Emergency Use Authorization (EUA). This EUA will remain  in effect (meaning this test can be used) for the duration of the COVID-19 declaration under Section 56 4(b)(1) of the Act, 21 U.S.C. section 360bbb-3(b)(1), unless the authorization is terminated or revoked sooner. Performed at [facility], [place] [address], [place], [place] [address]   Culture, blood (routine x 2)     Status: None (Preliminary result)   Collection Time: 02/19/19 11:02 AM   Specimen: BLOOD  Result Value Ref Range Status   Specimen Description   Final    BLOOD PORTA CATH Performed at [facility] [address], [place], [place] [address]    Special Requests   Final    BOTTLES DRAWN AEROBIC AND ANAEROBIC Blood Culture adequate volume   Culture   Final    NO GROWTH < 24 HOURS Performed at [facility], [place] [address], [place], [place] [address]    Report Status PENDING  Incomplete  Culture, blood (routine x 2)     Status: None (Preliminary result)   Collection Time: 02/19/19 11:11 AM   Specimen: BLOOD  Result Value Ref Range Status   Specimen Description   Final    BLOOD RIGHT ANTECUBITAL Performed at [facility] [address] [place], [place] [address]    Special Requests   Final    BOTTLES DRAWN AEROBIC AND ANAEROBIC Blood Culture results may not be optimal due to an excessive volume of blood received in culture bottles   Culture   Final    NO GROWTH < 24 HOURS Performed at [facility] [address] [place], [place] [address]    Report Status PENDING  Incomplete  Respiratory Panel by PCR     Status: None    Collection Time: 02/20/19  3:30  PM   Specimen: Nasal Mucosa; Respiratory  Result Value Ref Range Status   Adenovirus NOT DETECTED NOT DETECTED Final   Coronavirus 229E NOT DETECTED NOT DETECTED Final    Comment: (NOTE) The Coronavirus on the Respiratory Panel, DOES NOT test for the novel  Coronavirus (2019 nCoV)    Coronavirus HKU1 NOT DETECTED NOT DETECTED Final   Coronavirus NL63 NOT DETECTED NOT DETECTED Final   Coronavirus OC43 NOT DETECTED NOT DETECTED Final   Metapneumovirus NOT DETECTED NOT DETECTED Final   Rhinovirus / Enterovirus NOT DETECTED NOT DETECTED Final   Influenza A NOT DETECTED NOT DETECTED Final   Influenza B NOT DETECTED NOT DETECTED Final   Parainfluenza Virus 1 NOT DETECTED NOT DETECTED Final   Parainfluenza Virus 2 NOT DETECTED NOT DETECTED Final   Parainfluenza Virus 3 NOT DETECTED NOT DETECTED Final   Parainfluenza Virus 4 NOT DETECTED NOT DETECTED Final   Respiratory Syncytial Virus NOT DETECTED NOT DETECTED Final   Bordetella pertussis NOT DETECTED NOT DETECTED Final   Chlamydophila pneumoniae NOT DETECTED NOT DETECTED Final   Mycoplasma pneumoniae NOT DETECTED NOT DETECTED Final    Comment: Performed at [facility], [place] [address] [place], [place] [address]  MRSA PCR Screening     Status: None   Collection Time: 02/20/19  3:30 PM   Specimen: Nasal Mucosa; Nasopharyngeal  Result Value Ref Range Status   MRSA by PCR NEGATIVE NEGATIVE Final    Comment:        The GeneXpert MRSA Assay (FDA approved for NASAL specimens only), is one component of a comprehensive MRSA colonization surveillance program. It is not intended to diagnose MRSA infection nor to guide or monitor treatment for MRSA infections. Performed at [facility], [place] [address], [place], [place] [address]      Labs: BNP (last 3 results) Recent Labs    05/29/18 0958  BNP 123XX123*   Basic Metabolic Panel: Recent Labs  Lab 02/19/19 0816 02/20/19 0511  02/21/19 0439  NA 140 140 141  K 4.4 4.0 4.0  CL 108 107 108  CO2 24 26 25   GLUCOSE 122* 109* 103*  BUN 19 16 17   CREATININE 1.13* 1.15* 1.29*  CALCIUM 8.9 8.5* 8.6*   Liver Function Tests: Recent Labs  Lab 02/19/19 0816  AST 13*  ALT 9  ALKPHOS 91  BILITOT 0.5  PROT 6.8  ALBUMIN 3.5   No results for input(s): LIPASE, AMYLASE in the last 168 hours. No results for input(s): AMMONIA in the last 168 hours. CBC: Recent Labs  Lab 02/19/19 0816 02/20/19 0511 02/21/19 0439  WBC 11.9* 9.2 8.3  NEUTROABS 10.2* 6.2  --   HGB 11.0* 9.3* 9.4*  HCT 35.5* 30.9* 30.7*  MCV 91.5 92.8 91.9  PLT 173 155 168   Cardiac Enzymes: No results for input(s): CKTOTAL, CKMB, CKMBINDEX, TROPONINI in the last 168 hours. BNP: Invalid input(s): POCBNP CBG: No results for input(s): GLUCAP in the last 168 hours. D-Dimer Recent Labs    02/19/19 1111  DDIMER 0.48   Hgb A1c No results for input(s): HGBA1C in the last 72 hours. Lipid Profile Recent Labs    02/19/19 1111  TRIG 64   Thyroid function studies No results for input(s): TSH, T4TOTAL, T3FREE, THYROIDAB in the last 72 hours.  Invalid input(s): FREET3 Anemia work up Recent Labs    02/19/19 1111  FERRITIN 33   Urinalysis    Component Value Date/Time   COLORURINE AMBER (A) 03/14/2015 2118  APPEARANCEUR CLOUDY (A) 03/14/2015 2118   LABSPEC 1.015 04/23/2015 1247   PHURINE 5.0 04/23/2015 1247   PHURINE 5.5 03/14/2015 2118   GLUCOSEU Negative 04/23/2015 1247   HGBUR Negative 04/23/2015 1247   HGBUR MODERATE (A) 03/14/2015 2118   BILIRUBINUR Color Interference 04/23/2015 1247   KETONESUR Negative 04/23/2015 1247   KETONESUR NEGATIVE 03/14/2015 2118   PROTEINUR Color Interference 04/23/2015 1247   PROTEINUR NEGATIVE 03/14/2015 2118   UROBILINOGEN Color Interference 04/23/2015 1247   NITRITE Color Interference 04/23/2015 1247   NITRITE NEGATIVE 03/14/2015 2118   LEUKOCYTESUR Color Interference 04/23/2015 1247   Sepsis  Labs Invalid input(s): PROCALCITONIN,  WBC,  LACTICIDVEN   Time coordinating discharge: 35 minutes  SIGNED:  Mercy Riding, MD  [facility] 02/21/2019, 7:36 AM  If 7PM-7AM, please contact night-coverage [email or web address] Password [email or web address]

## 2019-02-22 LAB — LEGIONELLA PNEUMOPHILA SEROGP 1 UR AG: L. pneumophila Serogp 1 Ur Ag: NEGATIVE

## 2019-02-24 LAB — CULTURE, BLOOD (ROUTINE X 2)
Culture: NO GROWTH
Culture: NO GROWTH
Special Requests: ADEQUATE

## 2019-03-01 ENCOUNTER — Other Ambulatory Visit: Payer: Self-pay

## 2019-03-01 ENCOUNTER — Ambulatory Visit
Admission: RE | Admit: 2019-03-01 | Discharge: 2019-03-01 | Disposition: A | Discharge status: Home / Self Care | Payer: Medicaid Other | Source: Ambulatory Visit | Attending: Family Medicine | Admitting: Family Medicine

## 2019-03-01 DIAGNOSIS — Z1231 Encounter for screening mammogram for malignant neoplasm of breast: Secondary | ICD-10-CM

## 2019-03-02 ENCOUNTER — Ambulatory Visit (INDEPENDENT_AMBULATORY_CARE_PROVIDER_SITE_OTHER): Payer: Medicaid Other | Admitting: Family Medicine

## 2019-03-02 ENCOUNTER — Other Ambulatory Visit: Payer: Self-pay

## 2019-03-02 ENCOUNTER — Encounter: Payer: Self-pay | Admitting: Family Medicine

## 2019-03-02 VITALS — BP 132/78 | HR 83

## 2019-03-02 DIAGNOSIS — Z09 Encounter for follow-up examination after completed treatment for conditions other than malignant neoplasm: Secondary | ICD-10-CM

## 2019-03-02 DIAGNOSIS — Z1211 Encounter for screening for malignant neoplasm of colon: Secondary | ICD-10-CM | POA: Insufficient documentation

## 2019-03-02 HISTORY — DX: Encounter for screening for malignant neoplasm of colon: Z12.11

## 2019-03-02 HISTORY — DX: Encounter for follow-up examination after completed treatment for conditions other than malignant neoplasm: Z09

## 2019-03-02 NOTE — Assessment & Plan Note (Signed)
Hospital follow-up for community-acquired pneumonia pneumonia.  Patient is doing well and has completed her course of antibiotics.  Discharge paperwork did recommend repeat labs including CBC, BMP, Mg.  However last lab work was normal at day of discharge.  Patient also declines getting lab work today.  Patient follow-up if she has any recurrent issues.

## 2019-03-02 NOTE — Assessment & Plan Note (Signed)
Referred to gastroenterology for colonoscopy

## 2019-03-02 NOTE — Progress Notes (Signed)
    Subjective:  [name] is a 53 y.o. female who presents to the [facility] today with a chief complaint of hospital follow up for CAP.   HPI:  Patient presents for follow-up from recent hospitalization for left lower lobe community acquired pneumonia.  Remainder of work-up was negative for DVT.  Patient was discharged home with a course of Cefpodoxime and azithromycin to complete.  She has completed this.  She says she has improvement in shortness of breath and dyspnea.  She has a mild cough that is mostly at night and getting better.  She has not had any fevers or chills.  Discharge paperwork recommended repeat CBC/BMP-magnesium.  CBC noted for anemia at 9.4, baseline mid 11. Patient declines getting labs today.  Screening for colon cancer Patient needs screening for colon cancer, especially given her recent anemia.  She has out of date on her colonoscopy.  She would like to get one done.  Will place order quested.   ROS: Per HPI   Objective:  Physical Exam: BP 132/78   Pulse 83   LMP 10/26/2013   SpO2 98%   Gen: NAD, resting comfortably CV: RRR with no murmurs appreciated Pulm: NWOB, CTAB with no crackles, wheezes, or rhonchi GI: Normal bowel sounds present. Soft, Nontender, Nondistended. Skin: warm, dry Neuro: grossly normal, moves all extremities Psych: Normal affect and thought content  No results found for this or any previous visit (from the past 72 hour(s)).   Assessment/Plan:  Hospital discharge follow-up Hospital follow-up for community-acquired pneumonia pneumonia.  Patient is doing well and has completed her course of antibiotics.  Discharge paperwork did recommend repeat labs including CBC, BMP, Mg.  However last lab work was normal at day of discharge.  Patient also declines getting lab work today.  Patient follow-up if she has any recurrent issues.  Screen for colon cancer Referred to gastroenterology for colonoscopy.   Lab Orders  No laboratory test(s)  ordered today    No orders of the defined types were placed in this encounter.     Marny Lowenstein, MD, MS FAMILY MEDICINE RESIDENT - PGY3 03/02/2019 11:20 AM

## 2019-03-08 ENCOUNTER — Other Ambulatory Visit: Payer: Self-pay | Admitting: Family Medicine

## 2019-03-09 ENCOUNTER — Other Ambulatory Visit (INDEPENDENT_AMBULATORY_CARE_PROVIDER_SITE_OTHER): Payer: Self-pay | Admitting: Otolaryngology

## 2019-03-10 ENCOUNTER — Other Ambulatory Visit: Payer: Self-pay

## 2019-03-10 NOTE — Telephone Encounter (Signed)
Patient calls nurse line stating she is completely out of Xarelto. Patient stated the pharmacy reached out to [place] earlier this [facility], whoever I didn't see anything.

## 2019-03-11 ENCOUNTER — Other Ambulatory Visit: Payer: Self-pay

## 2019-03-11 MED ORDER — RIVAROXABAN 20 MG PO TABS: 20.0000 mg | ORAL_TABLET | ORAL | 1 refills | Status: DC

## 2019-03-11 NOTE — Telephone Encounter (Signed)
Pharmacy calling to check status.  Pt is out of medication. [name], CMA

## 2019-03-28 ENCOUNTER — Telehealth (INDEPENDENT_AMBULATORY_CARE_PROVIDER_SITE_OTHER): Payer: Medicare Other | Admitting: Family Medicine

## 2019-03-28 ENCOUNTER — Other Ambulatory Visit: Payer: Self-pay

## 2019-03-28 DIAGNOSIS — R05 Cough: Secondary | ICD-10-CM

## 2019-03-28 DIAGNOSIS — B9789 Other viral agents as the cause of diseases classified elsewhere: Secondary | ICD-10-CM | POA: Diagnosis not present

## 2019-03-28 DIAGNOSIS — R059 Cough, unspecified: Secondary | ICD-10-CM

## 2019-03-28 DIAGNOSIS — J988 Other specified respiratory disorders: Secondary | ICD-10-CM | POA: Diagnosis not present

## 2019-03-28 NOTE — Progress Notes (Signed)
[place] Telemedicine Visit  Patient consented to have virtual visit. Method of visit: Video was attempted, but technology challenges prevented patient from using video, so visit was conducted via telephone.  Encounter participants: Patient: [name] - located at home Provider: Danna Hefty - located at [facility] Others (if applicable): None  Chief Complaint: Cough, fever  HPI: Patient notes she was diagnosed and treated for pneumonia back in November. She notes she has continued cough but the phlegm has been improving and is now white/clear in color. She notes a low grade fever of 100 today. She notes she has some body aches as well. Notes going out a few times but denies any COVID exposures or sick contacts, although her 54 year old grand daughter visits daily. Denies any SOB or difficulty breathing. She denies any history of COPD or asthma, but was discharged with albuterol in February. She has been using the albuterol nebulizer that seems to help the "wheezing". She has a chronic cough and was supposed to follow up with pulmonology but she notes she has not seen them yet. She notes since February she has had increased in productive cough. But notes that more recently her productive cough doesn't seem worse. She notes there will be days she wont cough at all. Some rhinorrhea x 2-3 days. Has taken tylenol helped with fever. she notes today she is feeling fine - her cough is minimal, just body aches.   ROS: per HPI  Pertinent PMHx: Recent infection with CAP in November 2020  Exam:  Gen: Pleasant lady, well sounding during exam Respiratory: Speaking in full sentences, unlabored breathing, no coughing aprpeciated during exam    Assessment/Plan: Cough Symptoms appear most consistent with a viral respiratory illness including Flu vs COVID vs other viral etiologies. Cough is intermittent and no worse than normal which is reassuring against bacterial infection.  Will have patient tested for COVID. Recommended patient schedule an appointment to be tested in one of the following ways:  Text "COVID" to 88453 OR log onto [email or web address]. Patient can also call [phone].   [id number] for COVID testing placed.  There are three locations for testing, hours vary.   [facility] (old [facility] in [place]) [place]. [place], [place] [address] or [place] ([place]) [place], [place] [address] or [address] (near [place] in [place]) [place], [place] [address]  Patient advised of hours 8am-3:30pm and that they should be in line by 3 PM. -Reccommended Mucinex, Flonase, and Tylenol PRN for fevers/body aches  -ED precautions discussed and patient expressed good understanding. Discussed return precautions at length including RTC if worsening or no imrpovement in 4-5 days, worsenign fever, diffiuclty breathing, or devleoping worsening productive cough  -Patient counseled on wearing a mask and frequently washing hands -Patient instructed to avoid others until they meet criteria for ending isolation after any suspected COVID, which are:  -24 hours with no fever (without medications) and  -Respiratory symptoms have resolved (e.g. cough, shortness of breath) and -10 days since symptoms first appeared     Time spent during visit with patient: 20 minutes  Mina Marble, DO [place], PGY2 03/28/19

## 2019-03-29 NOTE — Assessment & Plan Note (Addendum)
Symptoms appear most consistent with a viral respiratory illness including Flu vs COVID vs other viral etiologies. Cough is intermittent and no worse than normal which is reassuring against bacterial infection. Will have patient tested for COVID. Recommended patient schedule an appointment to be tested in one of the following ways:  Text "COVID" to 88453 OR log onto [email or web address]. Patient can also call [phone].   [id number] for COVID testing placed.  There are three locations for testing, hours vary.   [facility] (old [facility] in [place]) [place]. [place], [place] [address] or [place] ([place]) [place], [place] [address] or [address] (near [place] in [place]) [place], [place] [address]  Patient advised of hours 8am-3:30pm and that they should be in line by 3 PM. -Reccommended Mucinex, Flonase, and Tylenol PRN for fevers/body aches  -ED precautions discussed and patient expressed good understanding. Discussed return precautions at length including RTC if worsening or no imrpovement in 4-5 days, worsenign fever, diffiuclty breathing, or devleoping worsening productive cough  -Patient counseled on wearing a mask and frequently washing hands -Patient instructed to avoid others until they meet criteria for ending isolation after any suspected COVID, which are:  -24 hours with no fever (without medications) and  -Respiratory symptoms have resolved (e.g. cough, shortness of breath) and -10 days since symptoms first appeared

## 2019-03-30 ENCOUNTER — Ambulatory Visit: Payer: Medicare Other | Attending: Internal Medicine

## 2019-03-30 DIAGNOSIS — Z20822 Contact with and (suspected) exposure to covid-19: Secondary | ICD-10-CM

## 2019-04-01 LAB — NOVEL CORONAVIRUS, NAA: SARS-CoV-2, NAA: NOT DETECTED

## 2019-04-15 ENCOUNTER — Encounter: Payer: Self-pay | Admitting: Gastroenterology

## 2019-04-15 ENCOUNTER — Ambulatory Visit (INDEPENDENT_AMBULATORY_CARE_PROVIDER_SITE_OTHER): Payer: Medicare Other | Admitting: Gastroenterology

## 2019-04-15 ENCOUNTER — Other Ambulatory Visit: Payer: Self-pay

## 2019-04-15 VITALS — BP 120/80 | HR 78 | Temp 97.2°F | Ht 65.0 in | Wt 386.0 lb

## 2019-04-15 DIAGNOSIS — R1310 Dysphagia, unspecified: Secondary | ICD-10-CM

## 2019-04-15 DIAGNOSIS — R1319 Other dysphagia: Secondary | ICD-10-CM

## 2019-04-15 DIAGNOSIS — K219 Gastro-esophageal reflux disease without esophagitis: Secondary | ICD-10-CM | POA: Diagnosis not present

## 2019-04-15 DIAGNOSIS — K449 Diaphragmatic hernia without obstruction or gangrene: Secondary | ICD-10-CM | POA: Diagnosis not present

## 2019-04-15 DIAGNOSIS — Z1211 Encounter for screening for malignant neoplasm of colon: Secondary | ICD-10-CM

## 2019-04-15 MED ORDER — OMEPRAZOLE 40 MG PO CPDR: 40.0000 mg | DELAYED_RELEASE_CAPSULE | Freq: Every day | ORAL | 5 refills | Status: DC

## 2019-04-15 NOTE — Patient Instructions (Signed)
If you are age 55 or older, your body mass index should be between 23-30. Your Body mass index is 64.23 kg/m. If this is out of the aforementioned range listed, please consider follow up with your Primary Care Provider.  If you are age 69 or younger, your body mass index should be between 19-25. Your Body mass index is 64.23 kg/m. If this is out of the aformentioned range listed, please consider follow up with your Primary Care Provider.   We will put you on the wait list for your colonoscopy.   It was a pleasure to see you today!  Dr. Loletha Carrow

## 2019-04-15 NOTE — Progress Notes (Signed)
[facility] Progress Note  Chief Complaint: Colon cancer screening  Subjective  History: [name] was last seen September 2020 and upper endoscopy for reflux and dysphagia.  She had been seen in telemedicine evaluation several months prior and endoscopic work-up was delayed by Covid.  She was noted to have hiatal hernia, and I recommended a surgical consultation for consideration of bariatric surgery.  She is referred back to [place] now for consideration of colon cancer screening.  [name] is feeling better I last saw her in the standpoint of her reflux and dysphagia.  She has only occasional esophageal dysphagia if she eats too quickly.  She has intermittent regurgitation, but has had considerable improvement in symptoms by change in diet and lifestyle.  She eats smaller portions, not after 5 PM. She was finally able to be seen by the bariatric surgery program at [facility], and says she has her second appointment there in a few weeks to move on with that process.  She tells me that they require a screening colonoscopy at her age prior to consideration of bariatric surgery.  She denies chronic abdominal pain constipation or rectal bleeding.  No family history of colorectal cancer.  ROS: Cardiovascular:  no chest pain Respiratory: no dyspnea Arthralgias Remainder systems negative except as above The patient's Past Medical, Family and Social History were reviewed and are on file in the EMR.  Objective:  Med list reviewed  Current Outpatient Medications:  .  acetaminophen (TYLENOL) 500 MG tablet, Take 1,000 mg by mouth every 6 (six) hours as needed for moderate pain., Disp: , Rfl:  .  albuterol (PROVENTIL) (2.5 MG/3ML) 0.083% nebulizer solution, Take 3 mLs (2.5 mg total) by nebulization every 4 (four) hours as needed for wheezing or shortness of breath., Disp: 75 mL, Rfl: 0 .  albuterol (VENTOLIN HFA) 108 (90 Base) MCG/ACT inhaler, Inhale 2 puffs into the lungs every 6 (six) hours as needed  for wheezing or shortness of breath. Please instruct patient in usage., Disp: 18 g, Rfl: 3 .  azithromycin (ZITHROMAX) 250 MG tablet, Take 1 tablet (250 mg total) by mouth daily., Disp: 3 each, Rfl: 0 .  cefpodoxime (VANTIN) 200 MG tablet, Take 1 tablet (200 mg total) by mouth 2 (two) times daily., Disp: 8 tablet, Rfl: 0 .  cetirizine (ZYRTEC) 10 MG tablet, Take 1 tablet (10 mg total) by mouth daily as needed for allergies (itching)., Disp: 30 tablet, Rfl: 0 .  diclofenac sodium (VOLTAREN) 1 % GEL, Apply to knee once per day (Patient taking differently: Apply 2 g topically daily as needed (for knee pain). ), Disp: 50 g, Rfl: 0 .  fesoterodine (TOVIAZ) 8 MG TB24 tablet, TAKE 1 TABLET(8 MG) BY MOUTH DAILY, Disp: 90 tablet, Rfl: 0 .  furosemide (LASIX) 20 MG tablet, Take 20 mg by mouth daily as needed for fluid., Disp: , Rfl:  .  gabapentin (NEURONTIN) 100 MG capsule, Take 1 capsule (100 mg total) by mouth daily as needed (neuropathy)., Disp: 30 capsule, Rfl: 2 .  guaiFENesin (MUCINEX) 600 MG 12 hr tablet, Take 1 tablet (600 mg total) by mouth 2 (two) times daily., Disp: 20 tablet, Rfl: 0 .  Iron-Vitamins (GERITOL COMPLETE PO), Take 1 tablet by mouth once a week. , Disp: , Rfl:  .  lidocaine-prilocaine (EMLA) cream, Apply 1 application topically as needed (prior to port being accessed)., Disp: , Rfl:  .  methocarbamol (ROBAXIN) 750 MG tablet, Take 1 tablet (750 mg total) by mouth 3 (three) times  daily as needed for muscle spasms., Disp: 40 tablet, Rfl: 0 .  omeprazole (PRILOSEC) 40 MG capsule, Take 1 capsule (40 mg total) by mouth daily., Disp: 30 capsule, Rfl: 5 .  rivaroxaban (XARELTO) 20 MG TABS tablet, Take 1 tablet (20 mg total) by mouth every morning., Disp: 90 tablet, Rfl: 1 .  triamcinolone cream (KENALOG) 0.1 %, Apply 1 application topically 2 (two) times daily as needed (skin irritation). APPLY TO THE AFFECTED AREA(S) TWICE DAILY AS NEEDED, Disp: 45 g, Rfl: 0 No current facility-administered  medications for this visit.  Facility-Administered Medications Ordered in Other Visits:  .  sodium chloride flush (NS) 0.9 % injection 10 mL, 10 mL, Intravenous, PRN, Livesay, Lennis P, MD, 10 mL at 05/31/15 0300 .  sodium chloride flush (NS) 0.9 % injection 10 mL, 10 mL, Intravenous, PRN, Livesay, Lennis P, MD, 10 mL at 08/02/15 1442   Vital signs in last 24 hrs: Vitals:   04/15/19 0947  BP: 120/80  Pulse: 78  Temp: (!) 97.2 F (36.2 C)  SpO2: 100%    Physical Exam   No exam, 20-minute visit today, all spent face-to-face with patient.    @ASSESSMENTPLANBEGIN @ Assessment: Encounter Diagnoses  Name Primary?  . Esophageal dysphagia Yes  . GERD without esophagitis   . Morbid obesity ([place])   . Hiatal hernia   . Colon cancer screening    Reflux symptoms are under better control with diet and lifestyle measures.  She was also able to decrease PPI to once daily.  I recommended she try and decrease it to every other day and see if there is still stable symptom control.  If so, remain on that till after bariatric surgery and expected improvement in reflux symptoms from that.  If symptoms worsen, she can always go back up to once daily therapy.  I refilled that prescription today.  If her bariatric surgery program is insistent upon her having a colonoscopy prior to their intervention, then unfortunately it must wait a few months until current Covid related restrictions on elective outpatient hospital-based procedures are lifted.  Her procedure must be done in the hospital endoscopy lab due to her elevated BMI.  We have therefore put her on our recall list for April of this year.  Covid prevalence and endoscopic restrictions will be reevaluated at that time.    [name]

## 2019-05-05 ENCOUNTER — Other Ambulatory Visit: Payer: Self-pay | Admitting: *Deleted

## 2019-05-05 ENCOUNTER — Encounter: Payer: Self-pay | Admitting: *Deleted

## 2019-05-05 ENCOUNTER — Telehealth: Payer: Self-pay | Admitting: *Deleted

## 2019-05-05 DIAGNOSIS — Z1211 Encounter for screening for malignant neoplasm of colon: Secondary | ICD-10-CM

## 2019-05-05 NOTE — Telephone Encounter (Signed)
Called and spoke with [name] who wanted to be scheduled for her screening colonoscopy. She has been scheduled for the following:  -05/17/2019 at 11:00 am previsit with the nurse -05/26/2019 at 10:15 am COVID-19 screening -05/30/19 at 10:15 am Colonoscopy at [facility] (Case ID: [id number])  [place] letter electronically faxed to Dr. [name]. Awaiting response.

## 2019-05-06 ENCOUNTER — Telehealth: Payer: Self-pay | Admitting: Gastroenterology

## 2019-05-06 NOTE — Telephone Encounter (Signed)
Dr. Loletha Carrow, I have forwarded you the message from Dr. [name].   The patient stated that she thinks the colonoscopy is required in order for her to have the gastric sleeve reduction. The sleeve reduction wouldn't take place until the summer. She stated she would look over the paperwork and call back and also call Dr. [name] to discuss her anticoag therapy because she thinks she needs to colon in order to have the sleeve reduction in order to have her hernia repaired.

## 2019-05-06 NOTE — Progress Notes (Signed)
Sent msg to GI physician & GI RN  High risk for clotting event due to recurrent VTE (2 prior episodes) Would recommend keeping her on xarelto prior to her screening colonoscopy Hopefully colonoscopy is normal and does not require biopsy of a large polyp or some other intervention that would create high risk of bleeding  If abnormality found that does need high risk intervention, recommend delaying any high bleeding risk interventions to another time At that point we would come up with a plan to hold anticoagulation prior to the repeat procedure  Leeanne Rio, MD

## 2019-05-06 NOTE — Telephone Encounter (Signed)
I will proceed as scheduled if she wishes, but only if she can be off anticoagulation at least 24 hours prior to procedure.

## 2019-05-06 NOTE — Telephone Encounter (Signed)
This patient is currently scheduled to have a screening colonoscopy with me on February 9.  We had sent a request to her primary care provider regarding the need to stop Xarelto 2 days prior to procedure. If they were not agreeable to that, then the procedure should be canceled.  The screening colonoscopy cannot be performed on oral anticoagulation, as it would not allow safe polypectomy.  More than that, I see that she was seen by the bariatric clinic at [facility] 3 days ago and they are planning a gastric sleeve resection. If that clinic does not require the patient to undergo a screening colonoscopy prior to that surgery (which I do not feel that they should require, as there is no clear medical indication for that), then she would be better off having her colonoscopy with me perhaps later this year or early next year after she has recovered from bariatric surgery and successfully lost weight as a result.  It would make the colonoscopy is required sedation a lower risk procedure for her.

## 2019-05-09 NOTE — Telephone Encounter (Signed)
Noted  

## 2019-05-13 ENCOUNTER — Telehealth: Payer: Self-pay | Admitting: *Deleted

## 2019-05-13 NOTE — Telephone Encounter (Signed)
-----   Message from Doran Stabler, MD sent at 05/09/2019  9:45 AM EST ----- Dr. [name],    I understand your concerns with this patient.  However, the plan should be to only put this patient through one procedure if possible, especially considering the increased sedation risks related to her high BMI.  Even a small polypectomy site can cause considerable bleeding if done while on anti-coagluation.  In such patients considered at high risk of thrombotic event, they should have their last dose of OAC 24 hours before procedure (if normal renal function, 48 hours if GFR below 50), and given Lovenox the evening prior to procedure.  No lovenox the morning of procedure.  [place] would either be started the evening of procedure or following AM, depending on interventions taken. In sum, a screening colonoscopy should not be done on [place].  If you feel this patient needs bridging lovenox, please assist [place] by making arrangements for that.  Thank you.  -  - Wilfrid Lund, MD    [facility]

## 2019-05-13 NOTE — Telephone Encounter (Signed)
-----   Message from Leeanne Rio, MD sent at 05/06/2019  2:43 PM EST ----- Hi there,  [name] is at high risk for a thrombotic event, since she has had two prior VTE episodes. I would recommend keeping her on her anticoagulation prior to screening colonoscopy, with hopes that the colonoscopy is normal and does not require any interventions that would be at high risk for bleeding.   If there is a finding that needs intervention that would create a high bleeding risk (large polyp, for example) would recommend delaying that intervention to another time. We could then create a plan for holding anticoagulation prior to the repeat procedure.  I hope this makes sense - let me know if you have questions.  Thanks! Leeanne Rio, MD  ----- Message ----- From: Dalene Seltzer, RN Sent: 05/05/2019  10:04 AM EST To: Leeanne Rio, MD

## 2019-05-26 ENCOUNTER — Other Ambulatory Visit (HOSPITAL_COMMUNITY): Payer: Medicare Other

## 2019-05-30 ENCOUNTER — Encounter (HOSPITAL_COMMUNITY): Payer: Self-pay

## 2019-05-30 ENCOUNTER — Ambulatory Visit (HOSPITAL_COMMUNITY): Admit: 2019-05-30 | Payer: Medicare Other | Admitting: Gastroenterology

## 2019-05-30 SURGERY — COLONOSCOPY WITH PROPOFOL
Anesthesia: Monitor Anesthesia Care

## 2019-06-21 ENCOUNTER — Telehealth: Payer: Self-pay

## 2019-06-21 NOTE — Telephone Encounter (Signed)
Patient LVM on nurse line stating she has an apt on Friday to get her covid vaccine. Patient stated her church is providing the vaccine, therefore they need a letter stating its ok for patient to receive. Please advise.

## 2019-06-22 NOTE — Telephone Encounter (Signed)
Patient contacted and informed of note ready up front.

## 2019-06-22 NOTE — Telephone Encounter (Signed)
Letter written, will place at front desk for patient to pick up Please inform her it is ready Thanks Leeanne Rio, MD

## 2019-06-22 NOTE — Telephone Encounter (Signed)
Patient calls nurse line and left another 2 VM regarding receiving letter for receiving COVID vaccination.   To PCP  Talbot Grumbling, RN

## 2019-06-23 ENCOUNTER — Other Ambulatory Visit: Payer: Self-pay | Admitting: Family Medicine

## 2019-06-24 ENCOUNTER — Other Ambulatory Visit: Payer: Self-pay | Admitting: Family Medicine

## 2019-06-27 NOTE — Telephone Encounter (Signed)
Patient calls nurse line to check on status of rx refill. Patient states that she has sleep study on Wednesday and needs rx before then  To PCP  Talbot Grumbling, RN

## 2019-06-28 NOTE — Telephone Encounter (Signed)
Pt calling to check status.  Sleep study is tomorrow. Christen Bame, CMA

## 2019-06-28 NOTE — Telephone Encounter (Signed)
Rx sent in via another encounter Leeanne Rio, MD

## 2019-06-28 NOTE — Telephone Encounter (Signed)
Patient calls nurse line checking the status of medication. Patient stated she has a sleep study tomorrow and was hoping to get medication before then, so she isn't up going to the bathroom during the sleep study.

## 2019-06-29 NOTE — Telephone Encounter (Signed)
Patient calls nurse line stating that pharmacy never received toviax rx. Spoke with pharmacist, who said that they did not receive. Gave verbal order for rx.   Talbot Grumbling, RN

## 2019-07-07 DIAGNOSIS — G4733 Obstructive sleep apnea (adult) (pediatric): Secondary | ICD-10-CM | POA: Diagnosis not present

## 2019-07-07 DIAGNOSIS — G4719 Other hypersomnia: Secondary | ICD-10-CM | POA: Diagnosis not present

## 2019-07-07 DIAGNOSIS — R0600 Dyspnea, unspecified: Secondary | ICD-10-CM | POA: Diagnosis not present

## 2019-07-07 DIAGNOSIS — I10 Essential (primary) hypertension: Secondary | ICD-10-CM | POA: Diagnosis not present

## 2019-07-15 ENCOUNTER — Telehealth: Payer: Self-pay

## 2019-07-15 ENCOUNTER — Other Ambulatory Visit: Payer: Self-pay

## 2019-07-15 DIAGNOSIS — Z1211 Encounter for screening for malignant neoplasm of colon: Secondary | ICD-10-CM

## 2019-07-15 NOTE — Telephone Encounter (Signed)
Left a message on voicemail to offer a colonoscopy at [facility] on 08-23-2019 or 08-25-2019.

## 2019-07-15 NOTE — Telephone Encounter (Signed)
[facility] Pre-operative Risk Assessment     Request for surgical clearance:     Endoscopy Procedure  What type of surgery is being performed?     Colonoscopy   When is this surgery scheduled?     08-23-2019  What type of clearance is required ?   Pharmacy  Are there any medications that need to be held prior to surgery and how long? Yes, Xarelto, 24 hours prior to procedure  Practice name and name of physician performing surgery?      [facility]  What is your office phone and fax number?      Phone- [phone]  [phone]  Anesthesia type (None, local, MAC, general) ?       MAC

## 2019-07-15 NOTE — Telephone Encounter (Signed)
Patient returned call.  She has been scheduled for 08-23-2019 @ 1130 am

## 2019-07-19 ENCOUNTER — Encounter: Payer: Self-pay | Admitting: Family Medicine

## 2019-07-19 ENCOUNTER — Other Ambulatory Visit: Payer: Self-pay

## 2019-07-19 ENCOUNTER — Ambulatory Visit (INDEPENDENT_AMBULATORY_CARE_PROVIDER_SITE_OTHER): Payer: Medicare HMO | Admitting: Family Medicine

## 2019-07-19 VITALS — BP 134/80 | HR 70 | Ht 65.0 in | Wt 392.8 lb

## 2019-07-19 DIAGNOSIS — Z1211 Encounter for screening for malignant neoplasm of colon: Secondary | ICD-10-CM | POA: Diagnosis not present

## 2019-07-19 DIAGNOSIS — R7303 Prediabetes: Secondary | ICD-10-CM

## 2019-07-19 DIAGNOSIS — R7989 Other specified abnormal findings of blood chemistry: Secondary | ICD-10-CM | POA: Diagnosis not present

## 2019-07-19 DIAGNOSIS — G4733 Obstructive sleep apnea (adult) (pediatric): Secondary | ICD-10-CM | POA: Diagnosis not present

## 2019-07-19 DIAGNOSIS — N183 Chronic kidney disease, stage 3 unspecified: Secondary | ICD-10-CM | POA: Diagnosis not present

## 2019-07-19 MED ORDER — TETANUS-DIPHTH-ACELL PERTUSSIS 5-2.5-18.5 LF-MCG/0.5 IM SUSP: 0.5000 mL | Freq: Once | INTRAMUSCULAR | 0 refills | Status: AC

## 2019-07-19 NOTE — Progress Notes (Signed)
    SUBJECTIVE:   CHIEF COMPLAINT / HPI:   Pre-diabetes: A1c last month was 5.9, checked at [facility], where patient is following to prepare for bariatric surgery. For the last 3 weeks has been on a special diet to lower A1c: healthy fats, whole grains, vegetables, berry fruits (strawberries, blueberries, blackberries), greek yogurt, any kind of meat, and no added sugar, no starches. Feels much better on this diet. Starting water aerobics back up this Friday. She states that she is determined to lower her A1c with lifestyle modifications.  Kidney function: [facility] wanted her to let PCP know about her elevated creatinine at 1.54 on March 15, up from 1.29 in November of 2020. [facility] told her this may be due to the fact that she is pre-diabetic, or may be caused by some of the medication she is on.   Colonoscopy: Scheduled in May. Patient reports this was scheduled just for routine purposes, not something the bariatric program is requiring of her. In a prior EGD she stopped anticoag 24 prior to procedure and then took it the next day. She did well with the procedure.  Sleep apnea: sleep study through [facility] showed mild OSA. Getting CPAP in the next week or so.  Getting second covid vaccine on April 19.   OBJECTIVE:   BP 134/80   Pulse 70   Ht 5\' 5"  (1.651 m)   Wt (!) 392 lb 12.8 oz (178.2 kg)   LMP 10/26/2013   SpO2 99%   BMI 65.37 kg/m   Physical Exam  Constitutional: She is well-developed, well-nourished, and in no distress.  Cardiovascular: Normal rate, regular rhythm and normal heart sounds.  Pulmonary/Chest: Effort normal and breath sounds normal.     ASSESSMENT/PLAN:   Health maintenance: - advised to get Tdap several weeks after her second COVID vaccination, printed rx  CKD (chronic kidney disease) stage 3, GFR 30-59 ml/min Repeat creatinine to assess kidney function. The elevation last month was likely fluctuation due to  dehydration  Screen for colon cancer Discussed with patient, reviewed risks of stopping anticoagulation for a procedure. She is at high risk of recurrent VTE given her multiple prior events. After discussion, since colonoscopy is just for routine purposes and not something the bariatric program is requiring, patient decided she wants to wait until after her bariatric surgery procedure and hopeful weight loss prior to undergoing colonoscopy. I agree with this plan, and it appears her GI physician Dr. Loletha Carrow has expressed similar recommendations in his prior notes. I will send him a message so he is aware.  OSA (obstructive sleep apnea) Followed by [facility], getting CPAP soon  Prediabetes Encouraged continued lifestyle changes, patient is motivated to make changes.    [place]   Patient seen along with MS3 student Why. I personally evaluated this patient along with the student, and verified all aspects of the history, physical exam, and medical decision making as documented by the student. I agree with the student's documentation and have made all necessary edits.  Chrisandra Netters, MD [place] than 30 minutes were spent on this encounter on the day of service, including pre-visit planning, actual face to face time, coordination of care, and documentation of visit.

## 2019-07-19 NOTE — Patient Instructions (Addendum)
It was great to see you again today!  I'll send Dr. Loletha Carrow a message about holding off on the colonoscopy.  Get your tetanus shot in a month or so  (at least 2 weeks after your second dose of COVID vaccine)  Checking kidney function today  Be well, Dr. [name]

## 2019-07-19 NOTE — Assessment & Plan Note (Addendum)
Discussed with patient, reviewed risks of stopping anticoagulation for a procedure. She is at high risk of recurrent VTE given her multiple prior events. After discussion, since colonoscopy is just for routine purposes and not something the bariatric program is requiring, patient decided she wants to wait until after her bariatric surgery procedure and hopeful weight loss prior to undergoing colonoscopy. I agree with this plan, and it appears her GI physician Dr. Loletha Carrow has expressed similar recommendations in his prior notes. I will send him a message so he is aware.

## 2019-07-19 NOTE — Assessment & Plan Note (Signed)
Repeat creatinine to assess kidney function. The elevation last month was likely fluctuation due to dehydration

## 2019-07-20 ENCOUNTER — Other Ambulatory Visit: Payer: Medicare HMO

## 2019-07-20 DIAGNOSIS — R7989 Other specified abnormal findings of blood chemistry: Secondary | ICD-10-CM | POA: Diagnosis not present

## 2019-07-21 DIAGNOSIS — Z713 Dietary counseling and surveillance: Secondary | ICD-10-CM | POA: Diagnosis not present

## 2019-07-21 LAB — BASIC METABOLIC PANEL
BUN/Creatinine Ratio: 18 (ref 9–23)
BUN: 22 mg/dL (ref 6–24)
CO2: 21 mmol/L (ref 20–29)
Calcium: 9.8 mg/dL (ref 8.7–10.2)
Chloride: 105 mmol/L (ref 96–106)
Creatinine, Ser: 1.2 mg/dL — ABNORMAL HIGH (ref 0.57–1.00)
GFR calc Af Amer: 59 mL/min/{1.73_m2} — ABNORMAL LOW (ref >59)
GFR calc non Af Amer: 51 mL/min/{1.73_m2} — ABNORMAL LOW (ref >59)
Glucose: 98 mg/dL (ref 65–99)
Potassium: 4.5 mmol/L (ref 3.5–5.2)
Sodium: 141 mmol/L (ref 134–144)

## 2019-07-25 ENCOUNTER — Encounter: Payer: Self-pay | Admitting: Family Medicine

## 2019-07-25 DIAGNOSIS — G4733 Obstructive sleep apnea (adult) (pediatric): Secondary | ICD-10-CM | POA: Insufficient documentation

## 2019-07-25 DIAGNOSIS — R7303 Prediabetes: Secondary | ICD-10-CM | POA: Insufficient documentation

## 2019-07-25 NOTE — Assessment & Plan Note (Signed)
Encouraged continued lifestyle changes, patient is motivated to make changes.

## 2019-07-25 NOTE — Assessment & Plan Note (Signed)
Followed by [facility], getting CPAP soon

## 2019-07-25 NOTE — Telephone Encounter (Signed)
Yes, cancel and ask patient to call us when ready (probably next year)

## 2019-07-25 NOTE — Telephone Encounter (Signed)
I spoke with [name] about this planned colonoscopy at her visit with me this past Tuesday.  Her bariatric program is not requiring her to have a colonoscopy prior to bariatric surgery, this was scheduled just for routine purposes. After discussion, she prefers to wait on the colonoscopy until after she has bariatric surgery and loses weight, which will make the colonoscopy procedure less risky from a sedation standpoint.  Dr. Loletha Carrow it appears from your prior notes that you are in agreement with this approach.    Does this seem reasonable?  Thanks! Leeanne Rio, MD

## 2019-07-25 NOTE — Telephone Encounter (Signed)
Patient is scheduled for 08-23-2019 at [facility]. Should we cancel?

## 2019-07-25 NOTE — Telephone Encounter (Signed)
Dr. [name],  Yes, I agree with that recommendation.   You are probably referring to my 05/06/19 phone note when I offered [place] the same advice.  We will take her off the call list for hospital procedures, and she can call us when feels ready to proceed after bariatric surgery recovery and weight loss.  - [facility] ___________________________________  Vivien Rota,  Please take patient off [facility] call/schedule list, and leave message for her to contact us when ready to proceed.  - HD

## 2019-07-26 ENCOUNTER — Telehealth: Payer: Self-pay

## 2019-07-26 NOTE — Telephone Encounter (Signed)
Patient notified and aware. She agrees to the suggested pl. One yr recall has been placed.

## 2019-07-26 NOTE — Telephone Encounter (Signed)
Patient calls nurse line requesting lab results from recent office visit. Informed patient of lab results per letter from PCP.   Informed patient that PCP advises follow up in six months.   Patient verbalizes understanding and is appreciative.   Talbot Grumbling, RN

## 2019-07-28 DIAGNOSIS — E559 Vitamin D deficiency, unspecified: Secondary | ICD-10-CM | POA: Diagnosis not present

## 2019-07-28 DIAGNOSIS — G4733 Obstructive sleep apnea (adult) (pediatric): Secondary | ICD-10-CM | POA: Diagnosis not present

## 2019-07-28 DIAGNOSIS — E785 Hyperlipidemia, unspecified: Secondary | ICD-10-CM | POA: Diagnosis not present

## 2019-07-28 DIAGNOSIS — Z6841 Body Mass Index (BMI) 40.0 and over, adult: Secondary | ICD-10-CM | POA: Diagnosis not present

## 2019-07-28 DIAGNOSIS — N183 Chronic kidney disease, stage 3 unspecified: Secondary | ICD-10-CM | POA: Diagnosis not present

## 2019-07-28 DIAGNOSIS — R7303 Prediabetes: Secondary | ICD-10-CM | POA: Diagnosis not present

## 2019-08-02 DIAGNOSIS — G4733 Obstructive sleep apnea (adult) (pediatric): Secondary | ICD-10-CM | POA: Diagnosis not present

## 2019-08-16 ENCOUNTER — Inpatient Hospital Stay: Payer: Medicare HMO

## 2019-08-16 ENCOUNTER — Inpatient Hospital Stay: Payer: Medicare HMO | Attending: Gynecologic Oncology | Admitting: Gynecologic Oncology

## 2019-08-16 ENCOUNTER — Encounter: Payer: Self-pay | Admitting: Gynecologic Oncology

## 2019-08-16 ENCOUNTER — Other Ambulatory Visit: Payer: Self-pay

## 2019-08-16 VITALS — BP 165/80 | HR 71 | Temp 98.2°F | Resp 18 | Ht 65.0 in | Wt 391.2 lb

## 2019-08-16 DIAGNOSIS — Z90722 Acquired absence of ovaries, bilateral: Secondary | ICD-10-CM | POA: Insufficient documentation

## 2019-08-16 DIAGNOSIS — Z90710 Acquired absence of both cervix and uterus: Secondary | ICD-10-CM | POA: Insufficient documentation

## 2019-08-16 DIAGNOSIS — C561 Malignant neoplasm of right ovary: Secondary | ICD-10-CM | POA: Diagnosis not present

## 2019-08-16 DIAGNOSIS — C541 Malignant neoplasm of endometrium: Secondary | ICD-10-CM

## 2019-08-16 DIAGNOSIS — Z9221 Personal history of antineoplastic chemotherapy: Secondary | ICD-10-CM | POA: Insufficient documentation

## 2019-08-16 DIAGNOSIS — M329 Systemic lupus erythematosus, unspecified: Secondary | ICD-10-CM | POA: Diagnosis not present

## 2019-08-16 NOTE — Progress Notes (Signed)
Followup of Consult: Gyn-Onc  CC:  Chief Complaint  Patient presents with  . Ovarian carcinosarcoma, right ([place])    Follow up  . Endometrial ca [facility])    Assessment/Plan:  [name]  is a 54 y.o.  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  She is s/p exploratory laparotomy and TAH, BSO, optimal cytoreduction to no gross residual visible disease on 03/13/15. S/p 6 cycles of adjuvant carboplatin and paclitaxel chemotherapy completed 08/02/15. Genetic testing BRCA negative. Complete clinical response (no evidence of disease).   Hx of PE in May, 2017, with recurrent DVT and PE in October, 2018 - on Lovenox.  - Will confirm disease status with CT abd/pelvis given that she is preparing for bariatric surgery. - CA 125 normal in June, 2020 - will check again today.   HPI: [name] is a very pleasant 54 year old woman with history of 2 prior cesarean sections who is seen initially in the hospital as an inpatient consultation at [facility] on 02/19/2015 for complex pelvic masses. The patient is a morbidly obese woman with a BMI 62 kg/m. She was admitted to [facility] on 02/18/2015 with pyelonephritis. CT imaging and then MRI imaging was performed to evaluate for an etiology for pyelonephritis. This identified A large pelvic mass which it to be arising from the right adnexa. It measured 13 x 9 x 17.5 cm and was irregular cystic and solid in nature. A second, but cystic and solid lesion within the left adnexa measuring 4.4 x 6.5 x 5.3 cm. In the third cystic and solid mass was seen anterior to the uterus and superior to the bladder measuring 4.2 x 5.4 x 5.3 cm. She had no pelvic lymphadenopathy, no carcinomatosis, and CA 125 was normal at 21U/mL.  Her infection was adequately treated with IV antibiotics and her acute kidney injury which manifested with an elevated creatinine 12.4 spontaneously resolved during her hospital stay  with avoidance of NSAID and good hydration. She was noted to be anemic during her hospitalization with a hemoglobin of 10.8. However this did not require transfusion and spontaneously improved over time.   On 03/13/2015 she underwent an exploratory laparotomy TAH/BSO and radical tumor debulking for a stage IIIc carcinosarcoma of the right ovary and an incidentally found synchronous stage I a endometrial cancer. Tumor was found with a large right ovarian mass, smaller left ovarian mass and a carcinosarcoma implant was involving the anterior cul-de-sac between the bladder and lower uterine segment. All disease sites were completely resected with no macroscopic disease remaining at the completion of debulking.   She went on to receive 6 cycles of adjuvant chemotherapy with carboplatin and paclitaxel between 04/12/15 and 08/02/15. She tolerated treatment well with some neutropenia. CA 125 was normal at 4.1 at completion of therapy in March, 2017. On 08/23/15 she underwent post-treatment CT imaging which showed: there is a 2.5 x 1.6 cm soft tissue nodule near the vaginal cuff. Within the right hemipelvis there is a 1.8 cm soft tissue nodule.   Repeat CT on 11/21/15 showed that the small bilateral deep pelvic side wall soft tissue masses have decreased in size and are less suggestive of persistent/recurrent disease.  She has a stable small left ventral hernia.   CA 125 in October, 2017 normal.  CT abdo/pelvis in May, 2018 showed no evidence of recurrent disease and CA 125 in May, 2018 was normal at 3.4.  In September, 2018 was normal at 3.  In October 2018 she was hospitalized with a left lower extremity DVT and PE. She was started on Lovenox. CT chest showed no lesions consistent with metastatic cancer.  CA 125 in December, 2018 normal at 3.   CA 125 in December, 2019 was normal at 3.5. CT abd/pelvis on 03/12/18 showed no evidence of recurrent disease.   CA 125 on 09/27/18 was normal at 3.7.  Interval  History:  A diagnosis of lupus was retracted after evaluation with rheumatology. She is taking injections for diabetes. She is preparing for bariatric surgery in the summer.  She has no symptoms concerning for recurrence.  Current Meds:  Outpatient Encounter Medications as of 08/16/2019  Medication Sig  . acetaminophen (TYLENOL) 500 MG tablet Take 1,000 mg by mouth every 6 (six) hours as needed for moderate pain.  Marland Kitchen albuterol (PROVENTIL) (2.5 MG/3ML) 0.083% nebulizer solution Take 3 mLs (2.5 mg total) by nebulization every 4 (four) hours as needed for wheezing or shortness of breath.  Marland Kitchen albuterol (VENTOLIN HFA) 108 (90 Base) MCG/ACT inhaler Inhale 2 puffs into the lungs every 6 (six) hours as needed for wheezing or shortness of breath. Please instruct patient in usage.  . cetirizine (ZYRTEC) 10 MG tablet Take 1 tablet (10 mg total) by mouth daily as needed for allergies (itching).  Marland Kitchen diclofenac sodium (VOLTAREN) 1 % GEL Apply to knee once per day (Patient taking differently: Apply 2 g topically daily as needed (for knee pain). )  . gabapentin (NEURONTIN) 100 MG capsule Take 1 capsule (100 mg total) by mouth daily as needed (neuropathy).  Marland Kitchen guaiFENesin (MUCINEX) 600 MG 12 hr tablet Take 1 tablet (600 mg total) by mouth 2 (two) times daily.  Marland Kitchen lidocaine-prilocaine (EMLA) cream Apply 1 application topically as needed (prior to port being accessed).  . methocarbamol (ROBAXIN) 750 MG tablet Take 1 tablet (750 mg total) by mouth 3 (three) times daily as needed for muscle spasms.  Marland Kitchen omeprazole (PRILOSEC) 40 MG capsule Take 1 capsule (40 mg total) by mouth daily.  . rivaroxaban (XARELTO) 20 MG TABS tablet Take 1 tablet (20 mg total) by mouth every morning.  . TOVIAZ 8 MG TB24 tablet TAKE 1 TABLET(8 MG) BY MOUTH DAILY  . triamcinolone cream (KENALOG) 0.1 % Apply 1 application topically 2 (two) times daily as needed (skin irritation). APPLY TO THE AFFECTED AREA(S) TWICE DAILY AS NEEDED  . VICTOZA 18  MG/3ML SOPN   . Vitamin D, Ergocalciferol, (DRISDOL) 1.25 MG (50000 UNIT) CAPS capsule Take 50,000 Units by mouth once a week.   Facility-Administered Encounter Medications as of 08/16/2019  Medication  . sodium chloride flush (NS) 0.9 % injection 10 mL  . sodium chloride flush (NS) 0.9 % injection 10 mL    Allergy:  Allergies  Allergen Reactions  . Penicillins Rash    Has patient had a PCN reaction causing immediate rash, facial/tongue/throat swelling, SOB or lightheadedness with hypotension: no Has patient had a PCN reaction causing severe rash involving mucus membranes or skin necrosis: no Has patient had a PCN reaction that required hospitalization in the hospital at the time Has patient had a PCN reaction occurring within the last 10 years: no If all of the above answers are "NO", then may proceed with Cephalosporin use.     Social Hx:   Social History   Socioeconomic History  . Marital status: Legally Separated    Spouse name: Not on file  . Number of children: Not on file  . Years of education: Not  on file  . Highest education level: Not on file  Occupational History  . Not on file  Tobacco Use  . Smoking status: Never Smoker  . Smokeless tobacco: Never Used  Substance and Sexual Activity  . Alcohol use: Yes    Comment: Maybe wine every 6 months  . Drug use: No  . Sexual activity: Yes    Partners: Male    Comment: Told she could not have more children in 1986  Other Topics Concern  . 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   Married x 23 years, recently separated (4/15).  No domestic violence.  + financial stress.     Lives previously with husband who moved out, now with daughter who has medical problems, including Hodgkin's disease, and granddaughter (age 24).     Uses seat belt.    Social Determinants of Health   Financial Resource  Strain:   . Difficulty of Paying Living Expenses:   Food Insecurity:   . Worried About Charity fundraiser in the Last Year:   . Arboriculturist in the Last Year:   Transportation Needs:   . Film/video editor (Medical):   Marland Kitchen Lack of Transportation (Non-Medical):   Physical Activity:   . Days of Exercise per Week:   . Minutes of Exercise per Session:   Stress:   . Feeling of Stress :   Social Connections:   . Frequency of Communication with Friends and Family:   . Frequency of Social Gatherings with Friends and Family:   . Attends Religious Services:   . Active Member of Clubs or Organizations:   . Attends Archivist Meetings:   Marland Kitchen Marital Status:   Intimate Partner Violence:   . Fear of Current or Ex-Partner:   . Emotionally Abused:   Marland Kitchen Physically Abused:   . Sexually Abused:     Past Surgical Hx:  Past Surgical History:  Procedure Laterality Date  . ABDOMINAL HYSTERECTOMY N/A 03/13/2015   Procedure: TOTAL HYSTERECTOMY ABDOMINAL BILATERAL SALPINGO OOPHORECTOMY RADICAL TUMOR [place];  Surgeon: Everitt Amber, MD;  Location: [facility];  Service: Gynecology;  Laterality: N/A;  . BOWEL RESECTION  03/13/2015   Procedure: SMALL BOWEL RESECTION;  Surgeon: Everitt Amber, MD;  Location: [facility];  Service: Gynecology;;  . [place] and 1984  . COLOSTOMY  2008  . COLOSTOMY TAKEDOWN    . ESOPHAGOGASTRODUODENOSCOPY (EGD) WITH PROPOFOL N/A 12/21/2018   Procedure: ESOPHAGOGASTRODUODENOSCOPY (EGD) WITH PROPOFOL;  Surgeon: Doran Stabler, MD;  Location: [facility] ENDOSCOPY;  Service: Gastroenterology;  Laterality: N/A;  . LAPAROTOMY N/A 03/13/2015   Procedure: EXPLORATORY LAPAROTOMY;  Surgeon: Everitt Amber, MD;  Location: [facility];  Service: Gynecology;  Laterality: N/A;  . TONSILLECTOMY AND ADENOIDECTOMY  1997  . VENTRAL HERNIA REPAIR  03/13/2015   Procedure: HERNIA REPAIR VENTRAL ADULT;  Surgeon: Everitt Amber, MD;  Location: [facility];  Service: Gynecology;;    Past Medical Hx:  Past  Medical History:  Diagnosis Date  . Allergy   . Anemia   . Anxiety   . Arthritis   . Blood transfusion without reported diagnosis   . Cough   . Diverticulitis with perforation 2010  . DVT (deep vein thrombosis) in pregnancy 11/24/2016  . DVT, lower extremity ([place])   . Endometrial cancer ([place])   . Family history  of adverse reaction to anesthesia    pts daughter had difficulty awakening following anesthesia, long time to wake up  . GERD (gastroesophageal reflux disease)   . Headache   . History of bronchitis   . History of ear infections   . Hx of migraines   . Hyperlipidemia   . Hypertension   . IBS (irritable bowel syndrome)   . Kidney infection   . Lupus (systemic lupus erythematosus) ([place]) 02/2017  . Morbid obesity ([place])   . Ovarian cancer ([place]) dx'd 01/2015  . PONV (postoperative nausea and vomiting)   . Pyelonephritis   . Scleroderma [facility])     Past Gynecological History:  C/s x 2  Patient's last menstrual period was 10/26/2013.  Family Hx:  Family History  Problem Relation Age of Onset  . Diabetes Mother   . Hypertension Mother   . Hyperlipidemia Mother   . Colon polyps Mother        4 total  . Fibroids Mother        s/p hysterectomy  . Hyperlipidemia Father   . Diabetes Daughter   . Hodgkin's lymphoma Daughter 46  . Asthma Maternal Aunt        severe - d. 2  . Prostate cancer Maternal Uncle 29  . Diabetes Paternal Aunt   . Diabetes Paternal Uncle   . Kidney failure Maternal Grandmother 84  . Pancreatic cancer Paternal Grandmother        dx. >50  . Diabetes Paternal Grandfather   . Diabetes Paternal Aunt   . Heart disease Neg Hx   . Stroke Neg Hx   . Colon cancer Neg Hx   . Stomach cancer Neg Hx   . Esophageal cancer Neg Hx   . Rectal cancer Neg Hx     Review of Systems:  Constitutional  Feels well,    ENT Normal appearing ears and nares bilaterally Skin/Breast  + lupus rash on face Cardiovascular  No chest pain, shortness of breath, or  edema  Pulmonary  No cough or wheeze.  Gastro Intestinal  No nausea, vomitting, or diarrhoea. No bright red blood per rectum, nochange in bowel movement, or constipation.  Genito Urinary  No frequency, urgency, dysuria,  Musculo Skeletal  + bilateral knee pain with ambulation, + shoulder pain Neurologic  No weakness, numbness, change in gait,  Psychology  No depression, anxiety, insomnia.   Vitals:  Blood pressure (!) 165/80, pulse 71, temperature 98.2 F (36.8 C), temperature source Temporal, resp. rate 18, height '5\' 5"'  (1.651 m), weight (!) 391 lb 3.2 oz (177.4 kg), last menstrual period 10/26/2013, SpO2 100 %.  Physical Exam: WD in NAD Neck  Supple NROM, without any enlargements.  Lymph Node Survey No cervical supraclavicular or inguinal adenopathy Cardiovascular  Pulse normal rate, regularity and rhythm. S1 and S2 normal.  Lungs  Clear to auscultation bilateraly, without wheezes/crackles/rhonchi. Good air movement.  Skin  + facial rash consistent with lupus Psychiatry  Alert and oriented to person, place, and time  Abdomen  Normoactive bowel sounds, abdomen soft, non-tender and obese without palpable evidence of hernia. There is a large midline incision which is healed. Back No CVA tenderness Genito Urinary  Vulva/vagina: Normal external female genitalia.  No lesions. No discharge or bleeding.  Bladder/urethra:  No lesions or masses, well supported bladder  Vagina: cuff in tact.  Cervix and uterus: surgically absent  Adnexa: no palpable masses. Rectal  Good tone, no masses no cul de sac nodularity.  Extremities  No  bilateral cyanosis, clubbing or edema.   Thereasa Solo, MD  08/16/2019, 3:58 PM

## 2019-08-16 NOTE — Patient Instructions (Signed)
Please contact Dr Serita Grit office (at [phone])  to request an appointment with her for November, 2021.  It is safe to proceed with your bariatric surgery. Your doctor can contact Dr Denman George at the above number to ask questions.  You will have CT scan and lab test to confirm there is no evidence of cancer.  Please notify Dr Denman George at phone number [phone] if you notice vaginal bleeding, new pelvic or abdominal pains, bloating, feeling full easy, or a change in bladder or bowel function.   Please return to see Dr Denman George in 6 months.

## 2019-08-17 LAB — CA 125: Cancer Antigen (CA) 125: 2.8 U/mL (ref 0.0–38.1)

## 2019-08-18 DIAGNOSIS — I10 Essential (primary) hypertension: Secondary | ICD-10-CM | POA: Diagnosis not present

## 2019-08-18 DIAGNOSIS — G4710 Hypersomnia, unspecified: Secondary | ICD-10-CM | POA: Diagnosis not present

## 2019-08-18 DIAGNOSIS — G4733 Obstructive sleep apnea (adult) (pediatric): Secondary | ICD-10-CM | POA: Diagnosis not present

## 2019-08-19 ENCOUNTER — Other Ambulatory Visit (HOSPITAL_COMMUNITY): Payer: Medicare HMO

## 2019-08-19 ENCOUNTER — Telehealth: Payer: Self-pay

## 2019-08-19 NOTE — Telephone Encounter (Signed)
Spoke with patient to inform that CA 125 is normal and stable.  Pt voiced understanding.  No questions at this time.

## 2019-08-23 ENCOUNTER — Ambulatory Visit (HOSPITAL_COMMUNITY): Admit: 2019-08-23 | Payer: Medicare HMO | Admitting: Gastroenterology

## 2019-08-23 ENCOUNTER — Encounter (HOSPITAL_COMMUNITY): Payer: Self-pay

## 2019-08-23 ENCOUNTER — Other Ambulatory Visit: Payer: Self-pay | Admitting: Family Medicine

## 2019-08-23 ENCOUNTER — Ambulatory Visit (INDEPENDENT_AMBULATORY_CARE_PROVIDER_SITE_OTHER): Payer: Medicare HMO | Admitting: Family Medicine

## 2019-08-23 ENCOUNTER — Ambulatory Visit (HOSPITAL_COMMUNITY)
Admission: RE | Admit: 2019-08-23 | Discharge: 2019-08-23 | Disposition: A | Discharge status: Home / Self Care | Payer: Medicare HMO | Source: Ambulatory Visit | Attending: Family Medicine | Admitting: Family Medicine

## 2019-08-23 ENCOUNTER — Other Ambulatory Visit: Payer: Self-pay

## 2019-08-23 VITALS — BP 145/80 | HR 92 | Ht 65.5 in | Wt 382.4 lb

## 2019-08-23 DIAGNOSIS — G8929 Other chronic pain: Secondary | ICD-10-CM | POA: Diagnosis not present

## 2019-08-23 DIAGNOSIS — K669 Disorder of peritoneum, unspecified: Secondary | ICD-10-CM | POA: Diagnosis not present

## 2019-08-23 DIAGNOSIS — Z01818 Encounter for other preprocedural examination: Secondary | ICD-10-CM

## 2019-08-23 DIAGNOSIS — R93422 Abnormal radiologic findings on diagnostic imaging of left kidney: Secondary | ICD-10-CM | POA: Insufficient documentation

## 2019-08-23 DIAGNOSIS — C561 Malignant neoplasm of right ovary: Secondary | ICD-10-CM | POA: Diagnosis not present

## 2019-08-23 DIAGNOSIS — K449 Diaphragmatic hernia without obstruction or gangrene: Secondary | ICD-10-CM | POA: Diagnosis not present

## 2019-08-23 DIAGNOSIS — M25562 Pain in left knee: Secondary | ICD-10-CM | POA: Diagnosis not present

## 2019-08-23 DIAGNOSIS — R102 Pelvic and perineal pain: Secondary | ICD-10-CM | POA: Insufficient documentation

## 2019-08-23 SURGERY — COLONOSCOPY WITH PROPOFOL
Anesthesia: Monitor Anesthesia Care

## 2019-08-23 MED ORDER — TETANUS-DIPHTH-ACELL PERTUSSIS 5-2.5-18.5 LF-MCG/0.5 IM SUSP: 0.5000 mL | Freq: Once | INTRAMUSCULAR | 0 refills | Status: AC

## 2019-08-23 NOTE — Progress Notes (Signed)
    SUBJECTIVE:   CHIEF COMPLAINT / HPI:   Pre-op eval for bariatric surgery: She is feeling much better than the last time she was here. She has been losing weight and is down 9 lbs from last week. Following a low carb diet with dietician and doing at home exercises and aquatics. She does not endorse any chest pain or SOB. She is able to walk up and down her street without shortness of breath, mostly limited by her knee pain. Recent sleep study determined OSA for which she has been using CPAP every night. Since receiving the CPAP she has not needed to use her albuterol at all. She has discussed her need for Rivaroxaban with her bariatric surgery team, and they have determined that she will stop this medication 1 day prior to the surgery and take another medication (does not remember name) day of surgery.  Surgery team requests she have an EKG done today for preop eval.  Also requests refill of voltaren for her knee pain, which is overall stable.  OBJECTIVE:   BP (!) 145/80   Pulse 92   Ht 5' 5.5" (1.664 m)   Wt (!) 382 lb 6.4 oz (173.5 kg)   LMP 10/26/2013   SpO2 97%   BMI 62.67 kg/m   Physical Exam  Constitutional: She is well-developed, well-nourished, and in no distress.  Cardiovascular: Normal rate, regular rhythm and normal heart sounds.  Pulmonary/Chest: Effort normal and breath sounds normal. She has no wheezes.  Abdominal: Soft. She exhibits no distension. There is no abdominal tenderness.   ASSESSMENT/PLAN:   Health Maintenance: - gave rx for Tdap   Morbid obesity ([place]) Here today for preoperative evaluation prior to bariatric surgery. EKG performed today at request of surgery team, and shows possible q waves in V1-V2. Looks similar to EKG from 02/19/2019, however q waves were not present on EKG 05/29/2018. I doubt these are clinically relevant but reviewed with patient the option for her to see a cardiologist for evaluation. Using shared decision making, she opted to see  a cardiologist. -Referring to cardiologist to be seen prior to surgery.  -Asked her to have the Bariatric program send over their plan on how to manage her blood thinner - suspect they will have her do lovenox prior to surgery. She is at high risk for recurrent VTE given her multiple prior episodes. -follow up again prior to surgery, will hold on completing paperwork until she seems cardiology and anticoagulation regimen is sent to me.  Left knee pain Refill voltaren  Christiana Cornea, [place]   Patient seen along with MS3 student [place]. I personally evaluated this patient along with the student, and verified all aspects of the history, physical exam, and medical decision making as documented by the student. I agree with the student's documentation and have made all necessary edits.  Chrisandra Netters, MD [place]

## 2019-08-23 NOTE — Patient Instructions (Signed)
It was great to see you again today!  I will wait until I get the info from the Bariatric program about how they plan to manage your blood thinner  Referring to cardiologist  Follow up with me again before your surgery  Get tetanus shot  Be well, Dr. [name]

## 2019-08-24 ENCOUNTER — Encounter (HOSPITAL_COMMUNITY): Payer: Self-pay

## 2019-08-24 ENCOUNTER — Ambulatory Visit (HOSPITAL_COMMUNITY)
Admission: RE | Admit: 2019-08-24 | Discharge: 2019-08-24 | Disposition: A | Discharge status: Home / Self Care | Payer: Medicare HMO | Source: Ambulatory Visit | Attending: Gynecologic Oncology | Admitting: Gynecologic Oncology

## 2019-08-24 DIAGNOSIS — C569 Malignant neoplasm of unspecified ovary: Secondary | ICD-10-CM | POA: Diagnosis not present

## 2019-08-24 DIAGNOSIS — K669 Disorder of peritoneum, unspecified: Secondary | ICD-10-CM | POA: Diagnosis not present

## 2019-08-24 DIAGNOSIS — C561 Malignant neoplasm of right ovary: Secondary | ICD-10-CM | POA: Diagnosis not present

## 2019-08-24 DIAGNOSIS — R93422 Abnormal radiologic findings on diagnostic imaging of left kidney: Secondary | ICD-10-CM | POA: Diagnosis not present

## 2019-08-24 DIAGNOSIS — K449 Diaphragmatic hernia without obstruction or gangrene: Secondary | ICD-10-CM | POA: Diagnosis not present

## 2019-08-24 DIAGNOSIS — R102 Pelvic and perineal pain: Secondary | ICD-10-CM | POA: Diagnosis not present

## 2019-08-24 MED ORDER — SODIUM CHLORIDE (PF) 0.9 % IJ SOLN
INTRAMUSCULAR | Status: AC
  Filled 2019-08-24: qty 50

## 2019-08-24 MED ORDER — HEPARIN SOD (PORK) LOCK FLUSH 100 UNIT/ML IV SOLN
INTRAVENOUS | Status: AC
  Filled 2019-08-24: qty 5

## 2019-08-24 MED ORDER — IOHEXOL 300 MG/ML  SOLN
100.0000 mL | Freq: Once | INTRAMUSCULAR | Status: AC | PRN
  Administered 2019-08-24: 100 mL via INTRAVENOUS

## 2019-08-24 MED ORDER — HEPARIN SOD (PORK) LOCK FLUSH 100 UNIT/ML IV SOLN
500.0000 [IU] | Freq: Once | INTRAVENOUS | Status: AC
  Administered 2019-08-24: 500 [IU] via INTRAVENOUS

## 2019-08-25 NOTE — Addendum Note (Signed)
Addended by: Leeanne Rio on: 08/25/2019 02:00 PM   Modules accepted: Orders

## 2019-08-25 NOTE — Assessment & Plan Note (Signed)
Here today for preoperative evaluation prior to bariatric surgery. EKG performed today at request of surgery team, and shows possible q waves in V1-V2. Looks similar to EKG from 02/19/2019, however q waves were not present on EKG 05/29/2018. I doubt these are clinically relevant but reviewed with patient the option for her to see a cardiologist for evaluation. Using shared decision making, she opted to see a cardiologist. -Referring to cardiologist to be seen prior to surgery.  -Asked her to have the Bariatric program send over their plan on how to manage her blood thinner - suspect they will have her do lovenox prior to surgery. She is at high risk for recurrent VTE given her multiple prior episodes. -follow up again prior to surgery, will hold on completing paperwork until she seems cardiology and anticoagulation regimen is sent to me.

## 2019-08-25 NOTE — Assessment & Plan Note (Signed)
Refill voltaren

## 2019-08-26 ENCOUNTER — Telehealth: Payer: Self-pay

## 2019-08-26 NOTE — Telephone Encounter (Signed)
LM for Ms Vanzanten to call back to discuss the results of her CTScan from 08-24-19.

## 2019-08-26 NOTE — Telephone Encounter (Signed)
Told [name] that the CT showed two tiny nodules on the omentum. Dr. Denman George is not concerned about the nodules at this time. The CT other wise shows no evidence of metastatic disease. Dr. Denman George will repeat the CT scan for f/u up of nodules as recommended  by radiology. [name] is to f/u in November of 2021 with Dr. Denman George. She will need the CT scan done prior to the visit. Pt to have bariatric surgery some time this summer. She wants to call the office after this surgery is completed to make CT and follow up appointments.

## 2019-08-29 ENCOUNTER — Telehealth: Payer: Self-pay

## 2019-08-29 DIAGNOSIS — Z7189 Other specified counseling: Secondary | ICD-10-CM | POA: Diagnosis not present

## 2019-08-29 NOTE — Telephone Encounter (Signed)
Noted. If symptoms worsen and she wants to be seen, can schedule in our clinic  Leeanne Rio, MD

## 2019-08-29 NOTE — Telephone Encounter (Signed)
Patient calls nurse line to report a recent fall. Patient denies loss of consciousness or hitting head. Patient states that she tripped over a step and fell and hit her left shoulder. Patient reports soreness with no bruising. Advised patient she could use cold therapy and take tylenol as needed for pain.   Patient states that she was told to report falls to PCP.   Return/ED precautions given  To PCP  Talbot Grumbling, RN

## 2019-09-01 DIAGNOSIS — I10 Essential (primary) hypertension: Secondary | ICD-10-CM | POA: Diagnosis not present

## 2019-09-01 DIAGNOSIS — G4733 Obstructive sleep apnea (adult) (pediatric): Secondary | ICD-10-CM | POA: Diagnosis not present

## 2019-09-01 DIAGNOSIS — I2699 Other pulmonary embolism without acute cor pulmonale: Secondary | ICD-10-CM | POA: Diagnosis not present

## 2019-09-01 DIAGNOSIS — R7303 Prediabetes: Secondary | ICD-10-CM | POA: Diagnosis not present

## 2019-09-01 DIAGNOSIS — N183 Chronic kidney disease, stage 3 unspecified: Secondary | ICD-10-CM | POA: Diagnosis not present

## 2019-09-01 DIAGNOSIS — Z6841 Body Mass Index (BMI) 40.0 and over, adult: Secondary | ICD-10-CM | POA: Diagnosis not present

## 2019-09-06 ENCOUNTER — Other Ambulatory Visit: Payer: Self-pay | Admitting: Family Medicine

## 2019-09-16 ENCOUNTER — Other Ambulatory Visit: Payer: Self-pay

## 2019-09-16 ENCOUNTER — Encounter: Payer: Self-pay | Admitting: Internal Medicine

## 2019-09-16 ENCOUNTER — Ambulatory Visit (INDEPENDENT_AMBULATORY_CARE_PROVIDER_SITE_OTHER): Payer: Medicare HMO | Admitting: Internal Medicine

## 2019-09-16 VITALS — BP 146/85 | HR 72 | Ht 65.5 in | Wt 379.0 lb

## 2019-09-16 DIAGNOSIS — Z6841 Body Mass Index (BMI) 40.0 and over, adult: Secondary | ICD-10-CM | POA: Diagnosis not present

## 2019-09-16 DIAGNOSIS — E785 Hyperlipidemia, unspecified: Secondary | ICD-10-CM | POA: Diagnosis not present

## 2019-09-16 DIAGNOSIS — R7303 Prediabetes: Secondary | ICD-10-CM | POA: Diagnosis not present

## 2019-09-16 DIAGNOSIS — I10 Essential (primary) hypertension: Secondary | ICD-10-CM

## 2019-09-16 DIAGNOSIS — G4733 Obstructive sleep apnea (adult) (pediatric): Secondary | ICD-10-CM

## 2019-09-16 DIAGNOSIS — Z01818 Encounter for other preprocedural examination: Secondary | ICD-10-CM

## 2019-09-16 DIAGNOSIS — Z86711 Personal history of pulmonary embolism: Secondary | ICD-10-CM | POA: Diagnosis not present

## 2019-09-16 DIAGNOSIS — N183 Chronic kidney disease, stage 3 unspecified: Secondary | ICD-10-CM | POA: Diagnosis not present

## 2019-09-16 DIAGNOSIS — Z79899 Other long term (current) drug therapy: Secondary | ICD-10-CM | POA: Diagnosis not present

## 2019-09-16 DIAGNOSIS — E559 Vitamin D deficiency, unspecified: Secondary | ICD-10-CM | POA: Diagnosis not present

## 2019-09-16 MED ORDER — AMLODIPINE BESYLATE 5 MG PO TABS
5.0000 mg | ORAL_TABLET | Freq: Every day | ORAL | 3 refills | Status: DC
Start: 2019-09-16 — End: 2020-06-14

## 2019-09-16 NOTE — Patient Instructions (Signed)
Medication Instructions:   START TAKING AMLODIPINE 5 MG ONE TABLET DAILY   *If you need a refill on your cardiac medications before your next appointment, please call your pharmacy*   Lab Work: NOT NEEDED.   Testing/Procedures: WILL BE SCHEDULE AT [place] Your physician has requested that you have an echocardiogram. Echocardiography is a painless test that uses sound waves to create images of your heart. It provides your doctor with information about the size and shape of your heart and how well your heart's chambers and valves are working. This procedure takes approximately one hour. There are no restrictions for this procedure.     Follow-Up: At [facility], you and your health needs are our priority.  As part of our continuing mission to provide you with exceptional heart care, we have created designated Provider Care Teams.  These Care Teams include your primary Cardiologist (physician) and Advanced Practice Providers (APPs -  Physician Assistants and Nurse Practitioners) who all work together to provide you with the care you need, when you need it.   Your next appointment:   3 week(s)  The format for your next appointment:   In Person  Provider:   Cherlynn Kaiser, MD   Other Instructions  will be able to clear for surgery  Once echo is completed

## 2019-09-16 NOTE — Progress Notes (Signed)
Cardiology Office Note:    Date:  09/16/2019   ID:  [name], DOB 1965-10-02, MRN [id number]  PCP:  Leeanne Rio, MD  Cardiologist:  Elouise Munroe, MD  Electrophysiologist:  None   Referring MD: Leeanne Rio, MD   Chief Complaint: preoperative CV evaluation  History of Present Illness:    [name] is a 54 y.o. female with a history of morbid obesity, endometrial and ovarian cancer, hypertension, hyperlipidemia, diabetes, DVT/PE who presents for cardiovascular risk stratification and preoperative assessment.   She overall feels well and has been losing weight in anticipating of bariatric surgery. Though she has a complex past medical history, there are no significant active issues. She is able to exercise without limiting shortness of breath. No CP, no palpitations.   She has OSA and uses CPAP. She has a history of PE in 2017 and again in 2018, and continues on Xarelto for indefinite anticoagulation. Was previously diagnosed with lupus but this was retracted after a visit with rheumatology.   Carries a diagnosis of HTN and HLD but not currently on medical therapy.   No known anesthesia complications.   Past Medical History:  Diagnosis Date  . Allergy   . Anemia   . Anxiety   . Arthritis   . Blood transfusion without reported diagnosis   . Cough   . Diverticulitis with perforation 2010  . DVT (deep vein thrombosis) in pregnancy 11/24/2016  . DVT, lower extremity ([place])   . Endometrial cancer ([place])   . Family history of adverse reaction to anesthesia    pts daughter had difficulty awakening following anesthesia, long time to wake up  . GERD (gastroesophageal reflux disease)   . Headache   . History of bronchitis   . History of ear infections   . Hx of migraines   . Hyperlipidemia   . Hypertension   . IBS (irritable bowel syndrome)   . Kidney infection   . Lupus (systemic lupus erythematosus) ([place]) 02/2017  . Morbid obesity ([place])   .  Ovarian cancer ([place]) dx'd 01/2015  . PONV (postoperative nausea and vomiting)   . Pyelonephritis   . Scleroderma [facility])     Past Surgical History:  Procedure Laterality Date  . ABDOMINAL HYSTERECTOMY N/A 03/13/2015   Procedure: TOTAL HYSTERECTOMY ABDOMINAL BILATERAL SALPINGO OOPHORECTOMY RADICAL TUMOR [place];  Surgeon: Everitt Amber, MD;  Location: [facility];  Service: Gynecology;  Laterality: N/A;  . BOWEL RESECTION  03/13/2015   Procedure: SMALL BOWEL RESECTION;  Surgeon: Everitt Amber, MD;  Location: [facility];  Service: Gynecology;;  . [place] and 1984  . COLOSTOMY  2008  . COLOSTOMY TAKEDOWN    . ESOPHAGOGASTRODUODENOSCOPY (EGD) WITH PROPOFOL N/A 12/21/2018   Procedure: ESOPHAGOGASTRODUODENOSCOPY (EGD) WITH PROPOFOL;  Surgeon: Doran Stabler, MD;  Location: [facility];  Service: Gastroenterology;  Laterality: N/A;  . LAPAROTOMY N/A 03/13/2015   Procedure: EXPLORATORY LAPAROTOMY;  Surgeon: Everitt Amber, MD;  Location: [facility];  Service: Gynecology;  Laterality: N/A;  . TONSILLECTOMY AND ADENOIDECTOMY  1997  . VENTRAL HERNIA REPAIR  03/13/2015   Procedure: HERNIA REPAIR VENTRAL ADULT;  Surgeon: Everitt Amber, MD;  Location: [facility];  Service: Gynecology;;    Current Medications: Current Meds  Medication Sig  . acetaminophen (TYLENOL) 500 MG tablet Take 1,000 mg by mouth every 6 (six) hours as needed for moderate pain.  Marland Kitchen albuterol (PROVENTIL) (2.5 MG/3ML) 0.083% nebulizer solution Take 3 mLs (2.5 mg total) by nebulization every  4 (four) hours as needed for wheezing or shortness of breath.  Marland Kitchen albuterol (VENTOLIN HFA) 108 (90 Base) MCG/ACT inhaler Inhale 2 puffs into the lungs every 6 (six) hours as needed for wheezing or shortness of breath. Please instruct patient in usage.  . cetirizine (ZYRTEC) 10 MG tablet Take 1 tablet (10 mg total) by mouth daily as needed for allergies (itching).  Marland Kitchen diclofenac Sodium (VOLTAREN) 1 % GEL Apply to knee once daily as needed  . gabapentin  (NEURONTIN) 100 MG capsule Take 1 capsule (100 mg total) by mouth daily as needed (neuropathy).  Marland Kitchen guaiFENesin (MUCINEX) 600 MG 12 hr tablet Take 1 tablet (600 mg total) by mouth 2 (two) times daily.  Marland Kitchen lidocaine-prilocaine (EMLA) cream Apply 1 application topically as needed (prior to port being accessed).  . methocarbamol (ROBAXIN) 750 MG tablet Take 1 tablet (750 mg total) by mouth 3 (three) times daily as needed for muscle spasms.  Marland Kitchen omeprazole (PRILOSEC) 40 MG capsule Take 1 capsule (40 mg total) by mouth daily.  . TOVIAZ 8 MG TB24 tablet TAKE 1 TABLET(8 MG) BY MOUTH DAILY  . triamcinolone cream (KENALOG) 0.1 % Apply 1 application topically 2 (two) times daily as needed (skin irritation). APPLY TO THE AFFECTED AREA(S) TWICE DAILY AS NEEDED  . VICTOZA 18 MG/3ML SOPN   . Vitamin D, Ergocalciferol, (DRISDOL) 1.25 MG (50000 UNIT) CAPS capsule Take 50,000 Units by mouth once a week.  Alveda Reasons 20 MG TABS tablet TAKE 1 TABLET(20 MG) BY MOUTH EVERY MORNING     Allergies:   Penicillins   Social History   Socioeconomic History  . Marital status: Legally Separated    Spouse name: Not on file  . Number of children: Not on file  . Years of education: Not on file  . Highest education level: Not on file  Occupational History  . Not on file  Tobacco Use  . Smoking status: Never Smoker  . Smokeless tobacco: Never Used  Vaping Use  . Vaping Use: Never used  Substance and Sexual Activity  . Alcohol use: Yes    Comment: Maybe wine every 6 months  . Drug use: No  . Sexual activity: Yes    Partners: Male    Comment: Told she could not have more children in 1986  Other Topics Concern  . 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   Married x 23 years, recently separated (4/15).  No domestic violence.  + financial stress.     Lives previously with husband who moved out, now  with daughter who has medical problems, including Hodgkin's disease, and granddaughter (age 35).     Uses seat belt.    Social Determinants of Health   Financial Resource Strain:   . Difficulty of Paying Living Expenses:   Food Insecurity:   . Worried About Charity fundraiser in the Last Year:   . Arboriculturist in the Last Year:   Transportation Needs:   . Film/video editor (Medical):   Marland Kitchen Lack of Transportation (Non-Medical):   Physical Activity:   . Days of Exercise per Week:   . Minutes of Exercise per Session:   Stress:   . Feeling of Stress :   Social Connections:   . Frequency of Communication with Friends and Family:   . Frequency of Social Gatherings with  Friends and Family:   . Attends Religious Services:   . Active Member of Clubs or Organizations:   . Attends Archivist Meetings:   Marland Kitchen Marital Status:      Family History: The patient's family history includes Asthma in her maternal aunt; Colon polyps in her mother; Diabetes in her daughter, mother, paternal aunt, paternal aunt, paternal grandfather, and paternal uncle; Fibroids in her mother; Hodgkin's lymphoma (age of onset: 40) in her daughter; Hyperlipidemia in her father and mother; Hypertension in her mother; Kidney failure (age of onset: 40) in her maternal grandmother; Pancreatic cancer in her paternal grandmother; Prostate cancer (age of onset: 42) in her maternal uncle. There is no history of Heart disease, Stroke, Colon cancer, Stomach cancer, Esophageal cancer, or Rectal cancer.  ROS:   Please see the history of present illness.    All other systems reviewed and are negative.  EKGs/Labs/Other Studies Reviewed:    The following studies were reviewed today:  EKG:  NSR  Recent Labs: 02/19/2019: ALT 9 02/21/2019: Hemoglobin 9.4; Platelets 168 07/20/2019: BUN 22; Creatinine, Ser 1.20; Potassium 4.5; Sodium 141  Recent Lipid Panel    Component Value Date/Time   CHOL 241 (H) 01/01/2018 1010    TRIG 64 02/19/2019 1111   HDL 54 01/01/2018 1010   CHOLHDL 4.5 01/01/2018 1010   VLDL 19 01/01/2018 1010   LDLCALC 168 (H) 01/01/2018 1010   LDLDIRECT 164 (H) 07/26/2013 0933    Physical Exam:    VS:  BP (!) 146/85   Pulse 72   Ht 5' 5.5" (1.664 m)   Wt (!) 379 lb (171.9 kg)   LMP 10/26/2013   BMI 62.11 kg/m     Wt Readings from Last 5 Encounters:  09/16/19 (!) 379 lb (171.9 kg)  08/23/19 (!) 382 lb 6.4 oz (173.5 kg)  08/16/19 (!) 391 lb 3.2 oz (177.4 kg)  07/19/19 (!) 392 lb 12.8 oz (178.2 kg)  04/15/19 (!) 386 lb (175.1 kg)     Constitutional: No acute distress Eyes: sclera non-icteric, normal conjunctiva and lids ENMT: normal dentition, moist mucous membranes Cardiovascular: regular rhythm, normal rate, no murmurs. S1 and S2 normal. Radial pulses normal bilaterally. No jugular venous distention.  Respiratory: clear to auscultation bilaterally GI : normal bowel sounds, soft and nontender. No distention.   MSK: extremities warm, well perfused. No edema.  NEURO: grossly nonfocal exam, moves all extremities. PSYCH: alert and oriented x 3, normal mood and affect.   ASSESSMENT:    1. Pre-op evaluation   2. OSA (obstructive sleep apnea)   3. History of pulmonary embolism   4. Essential hypertension, benign   5. Hyperlipidemia, unspecified hyperlipidemia type    PLAN:    Preoperative evaluation - She can complete >4METS without chest pain or SOB.  She has a normal electrocardiogram today.  With her history of receiving chemotherapy, obstructive sleep apnea, and history of recurrent PE, I would like to evaluate cardiovascular structure and function with an echocardiogram to ensure there is no significant impact from prior chemotherapy as well as factors that can affect right heart function as noted above.  Will make final comments about preoperative cardiovascular risk after her echocardiogram.  OSA-continue using CPAP.  Hypertension-blood pressure is suboptimally  controlled today.  I would like to start amlodipine 5 mg daily.  I have informed the patient we can reassess after weight loss with bariatric surgery if blood pressure normalizes.  Hyperlipidemia-patient is pursuing diet lifestyle modification as well as bariatric surgery.  We will reassess lipids in the postoperative period.  With a diagnosis of diabetes she will likely need a statin.  Cherlynn Kaiser, MD [place]  [facility]    Medication Adjustments/Labs and Tests Ordered: Current medicines are reviewed at length with the patient today.  Concerns regarding medicines are outlined above.  Orders Placed This Encounter  Procedures  . EKG 12-Lead  . ECHOCARDIOGRAM COMPLETE   Meds ordered this encounter  Medications  . amLODipine (NORVASC) 5 MG tablet    Sig: Take 1 tablet (5 mg total) by mouth daily.    Dispense:  90 tablet    Refill:  3    Patient Instructions  Medication Instructions:   START TAKING AMLODIPINE 5 MG ONE TABLET DAILY   *If you need a refill on your cardiac medications before your next appointment, please call your pharmacy*   Lab Work: NOT NEEDED.   Testing/Procedures: WILL BE SCHEDULE AT [place] Your physician has requested that you have an echocardiogram. Echocardiography is a painless test that uses sound waves to create images of your heart. It provides your doctor with information about the size and shape of your heart and how well your heart's chambers and valves are working. This procedure takes approximately one hour. There are no restrictions for this procedure.     Follow-Up: At [facility], you and your health needs are our priority.  As part of our continuing mission to provide you with exceptional heart care, we have created designated Provider Care Teams.  These Care Teams include your primary Cardiologist (physician) and Advanced Practice Providers (APPs -  Physician Assistants and Nurse Practitioners) who  all work together to provide you with the care you need, when you need it.   Your next appointment:   3 week(s)  The format for your next appointment:   In Person  Provider:   Cherlynn Kaiser, MD   Other Instructions  will be able to clear for surgery  Once echo is completed

## 2019-09-19 DIAGNOSIS — Z7189 Other specified counseling: Secondary | ICD-10-CM | POA: Diagnosis not present

## 2019-09-19 DIAGNOSIS — Z6841 Body Mass Index (BMI) 40.0 and over, adult: Secondary | ICD-10-CM | POA: Diagnosis not present

## 2019-09-22 DIAGNOSIS — Z6841 Body Mass Index (BMI) 40.0 and over, adult: Secondary | ICD-10-CM | POA: Diagnosis not present

## 2019-09-23 DIAGNOSIS — E559 Vitamin D deficiency, unspecified: Secondary | ICD-10-CM | POA: Diagnosis not present

## 2019-09-23 DIAGNOSIS — R7303 Prediabetes: Secondary | ICD-10-CM | POA: Diagnosis not present

## 2019-09-23 DIAGNOSIS — Z79899 Other long term (current) drug therapy: Secondary | ICD-10-CM | POA: Diagnosis not present

## 2019-09-25 ENCOUNTER — Other Ambulatory Visit: Payer: Self-pay | Admitting: Family Medicine

## 2019-10-02 DIAGNOSIS — G4733 Obstructive sleep apnea (adult) (pediatric): Secondary | ICD-10-CM | POA: Diagnosis not present

## 2019-10-06 ENCOUNTER — Other Ambulatory Visit: Payer: Self-pay | Admitting: Family Medicine

## 2019-10-11 ENCOUNTER — Ambulatory Visit (HOSPITAL_COMMUNITY): Payer: Medicare HMO | Attending: Internal Medicine

## 2019-10-11 ENCOUNTER — Other Ambulatory Visit: Payer: Self-pay

## 2019-10-11 DIAGNOSIS — G4733 Obstructive sleep apnea (adult) (pediatric): Secondary | ICD-10-CM | POA: Insufficient documentation

## 2019-10-11 DIAGNOSIS — Z86711 Personal history of pulmonary embolism: Secondary | ICD-10-CM | POA: Diagnosis not present

## 2019-10-11 DIAGNOSIS — Z01818 Encounter for other preprocedural examination: Secondary | ICD-10-CM

## 2019-10-11 DIAGNOSIS — I119 Hypertensive heart disease without heart failure: Secondary | ICD-10-CM | POA: Diagnosis not present

## 2019-10-11 DIAGNOSIS — E785 Hyperlipidemia, unspecified: Secondary | ICD-10-CM | POA: Insufficient documentation

## 2019-10-11 DIAGNOSIS — Z01810 Encounter for preprocedural cardiovascular examination: Secondary | ICD-10-CM | POA: Insufficient documentation

## 2019-10-13 DIAGNOSIS — N183 Chronic kidney disease, stage 3 unspecified: Secondary | ICD-10-CM | POA: Diagnosis not present

## 2019-10-13 DIAGNOSIS — R7303 Prediabetes: Secondary | ICD-10-CM | POA: Diagnosis not present

## 2019-10-13 DIAGNOSIS — I10 Essential (primary) hypertension: Secondary | ICD-10-CM | POA: Diagnosis not present

## 2019-10-13 DIAGNOSIS — E785 Hyperlipidemia, unspecified: Secondary | ICD-10-CM | POA: Diagnosis not present

## 2019-10-13 DIAGNOSIS — K219 Gastro-esophageal reflux disease without esophagitis: Secondary | ICD-10-CM | POA: Diagnosis not present

## 2019-10-13 DIAGNOSIS — I2699 Other pulmonary embolism without acute cor pulmonale: Secondary | ICD-10-CM | POA: Diagnosis not present

## 2019-10-13 DIAGNOSIS — G4733 Obstructive sleep apnea (adult) (pediatric): Secondary | ICD-10-CM | POA: Diagnosis not present

## 2019-10-19 ENCOUNTER — Telehealth: Payer: Self-pay | Admitting: Gastroenterology

## 2019-10-19 ENCOUNTER — Other Ambulatory Visit: Payer: Self-pay

## 2019-10-19 MED ORDER — OMEPRAZOLE 40 MG PO CPDR: 40.0000 mg | DELAYED_RELEASE_CAPSULE | Freq: Every day | ORAL | 5 refills | Status: DC

## 2019-10-19 NOTE — Telephone Encounter (Signed)
Patient called requesting refill on Omprazole

## 2019-10-19 NOTE — Telephone Encounter (Signed)
Refilled as directed.  

## 2019-10-21 ENCOUNTER — Telehealth: Payer: Self-pay

## 2019-10-21 NOTE — Telephone Encounter (Signed)
Patient calls nurse line requesting medication refills and is also wanting to check if we have received medical report from [place]. Checked provider box and received faxes and was unable to find documentation.   Forwarding to PCP  Talbot Grumbling, RN

## 2019-10-28 NOTE — Telephone Encounter (Signed)
I received a copy of the anticoagulation plan from the surgery office. I called the surgery office and requested they fax over the form they need me to sign since I have not seen that yet. I have not received information about whether cardiology has cleared her for surgery (that office note is not signed). I have messaged the cardiology provider asking her to weigh in - once I have that I can complete the preop evaluation form.  Leeanne Rio, MD

## 2019-10-31 ENCOUNTER — Other Ambulatory Visit: Payer: Self-pay

## 2019-10-31 ENCOUNTER — Ambulatory Visit (INDEPENDENT_AMBULATORY_CARE_PROVIDER_SITE_OTHER): Payer: Medicare HMO | Admitting: Internal Medicine

## 2019-10-31 ENCOUNTER — Encounter: Payer: Self-pay | Admitting: Internal Medicine

## 2019-10-31 VITALS — BP 141/80 | HR 80 | Temp 97.0°F | Ht 65.5 in | Wt 374.0 lb

## 2019-10-31 DIAGNOSIS — Z8759 Personal history of other complications of pregnancy, childbirth and the puerperium: Secondary | ICD-10-CM

## 2019-10-31 DIAGNOSIS — I10 Essential (primary) hypertension: Secondary | ICD-10-CM

## 2019-10-31 DIAGNOSIS — G4733 Obstructive sleep apnea (adult) (pediatric): Secondary | ICD-10-CM | POA: Diagnosis not present

## 2019-10-31 DIAGNOSIS — Z86711 Personal history of pulmonary embolism: Secondary | ICD-10-CM

## 2019-10-31 DIAGNOSIS — Z01818 Encounter for other preprocedural examination: Secondary | ICD-10-CM

## 2019-10-31 DIAGNOSIS — R7303 Prediabetes: Secondary | ICD-10-CM | POA: Diagnosis not present

## 2019-10-31 NOTE — Patient Instructions (Signed)
Medication Instructions:  Your Physician recommend you continue on your current medication as directed.    *If you need a refill on your cardiac medications before your next appointment, please call your pharmacy*   Lab Work: None   Testing/Procedures: None   Follow-Up: At [facility], you and your health needs are our priority.  As part of our continuing mission to provide you with exceptional heart care, we have created designated Provider Care Teams.  These Care Teams include your primary Cardiologist (physician) and Advanced Practice Providers (APPs -  Physician Assistants and Nurse Practitioners) who all work together to provide you with the care you need, when you need it.  We recommend signing up for the patient portal called "MyChart".  Sign up information is provided on this After Visit Summary.  MyChart is used to connect with patients for Virtual Visits (Telemedicine).  Patients are able to view lab/test results, encounter notes, upcoming appointments, etc.  Non-urgent messages can be sent to your provider as well.   To learn more about what you can do with MyChart, go to [email or web address].    Your next appointment:   3 month(s)  The format for your next appointment:   In Person  Provider:   Gayatri Acharya, MD    

## 2019-10-31 NOTE — Telephone Encounter (Signed)
[name] 1965-09-13   Patient Active Problem List   Diagnosis Date Noted  . OSA (obstructive sleep apnea) 07/25/2019  . Prediabetes 07/25/2019  . Hospital discharge follow-up 03/02/2019  . Screen for colon cancer 03/02/2019  . Community acquired pneumonia 02/19/2019  . Pneumonia 02/19/2019  . Neck pain 01/13/2019  . Lateral epicondylitis of left elbow 01/13/2019  . Cough 06/11/2018  . Difficulty sleeping 06/11/2018  . Normocytic anemia 05/29/2018  . UTI (urinary tract infection) 05/07/2018  . Scleroderma ([place]) 04/22/2018  . Hemoptysis 04/22/2018  . Swelling of foot joint, right 04/08/2018  . Enlarged lymph nodes 08/13/2017  . Positive ANA (antinuclear antibody) 09/23/2016  . Eczema 01/27/2016  . Pulmonary embolism ([place]) 09/12/2015  . Portacath in place 09/12/2015  . Long term current use of anticoagulant therapy 09/06/2015  . Slow rate of speech 08/22/2015  . Chemotherapy-induced peripheral neuropathy ([place]) 06/13/2015  . CKD (chronic kidney disease) stage 3, GFR 30-59 ml/min 05/12/2015  . GERD (gastroesophageal reflux disease) 05/12/2015  . Ovarian carcinosarcoma, right ([place]) 03/29/2015  . Endometrial ca ([place]) 03/29/2015  . Ventral hernia without obstruction or gangrene   . Morbid obesity ([place]) 02/19/2015  . Essential hypertension, benign   . Stress at home 12/14/2014  . Left knee pain 08/04/2013  . Seasonal allergies 07/29/2013  . Right knee pain 11/28/2011    Past Medical History:  Diagnosis Date  . Allergy   . Anemia   . Anxiety   . Arthritis   . Blood transfusion without reported diagnosis   . Cough   . Diverticulitis with perforation 2010  . DVT (deep vein thrombosis) in pregnancy 11/24/2016  . DVT, lower extremity ([place])   . Endometrial cancer ([place])   . Family history of adverse reaction to anesthesia    pts daughter had difficulty awakening following anesthesia, long time to wake up  . GERD (gastroesophageal reflux disease)   . Headache   .  History of bronchitis   . History of ear infections   . Hx of migraines   . Hyperlipidemia   . Hypertension   . IBS (irritable bowel syndrome)   . Kidney infection   . Lupus (systemic lupus erythematosus) ([place]) 02/2017  . Morbid obesity ([place])   . Ovarian cancer ([place]) dx'd 01/2015  . PONV (postoperative nausea and vomiting)   . Pyelonephritis   . Scleroderma ([facility])      Allergies as of 10/21/2019      Reactions   Penicillins Rash   Has patient had a PCN reaction causing immediate rash, facial/tongue/throat swelling, SOB or lightheadedness with hypotension: no Has patient had a PCN reaction causing severe rash involving mucus membranes or skin necrosis: no Has patient had a PCN reaction that required hospitalization in the hospital at the time Has patient had a PCN reaction occurring within the last 10 years: no If all of the above answers are "NO", then may proceed with Cephalosporin use.      Medication List       Accurate as of October 21, 2019 11:59 PM. If you have any questions, ask your nurse or doctor.        acetaminophen 500 MG tablet Commonly known as: TYLENOL Take 1,000 mg by mouth every 6 (six) hours as needed for moderate pain.   albuterol (2.5 MG/3ML) 0.083% nebulizer solution Commonly known as: PROVENTIL Take 3 mLs (2.5 mg total) by nebulization every 4 (four) hours as needed for wheezing or shortness of breath.   albuterol 108 (90 Base)  MCG/ACT inhaler Commonly known as: VENTOLIN HFA Inhale 2 puffs into the lungs every 6 (six) hours as needed for wheezing or shortness of breath. Please instruct patient in usage.   amLODipine 5 MG tablet Commonly known as: NORVASC Take 1 tablet (5 mg total) by mouth daily.   cetirizine 10 MG tablet Commonly known as: ZYRTEC Take 1 tablet (10 mg total) by mouth daily as needed for allergies (itching).   diclofenac Sodium 1 % Gel Commonly known as: VOLTAREN Apply to knee once daily as needed   gabapentin 100 MG  capsule Commonly known as: NEURONTIN Take 1 capsule (100 mg total) by mouth daily as needed (neuropathy).   guaiFENesin 600 MG 12 hr tablet Commonly known as: MUCINEX Take 1 tablet (600 mg total) by mouth 2 (two) times daily.   lidocaine-prilocaine cream Commonly known as: EMLA Apply 1 application topically as needed (prior to port being accessed).   methocarbamol 750 MG tablet Commonly known as: ROBAXIN Take 1 tablet (750 mg total) by mouth 3 (three) times daily as needed for muscle spasms.   omeprazole 40 MG capsule Commonly known as: PRILOSEC Take 1 capsule (40 mg total) by mouth daily.   Toviaz 8 MG Tb24 tablet Generic drug: fesoterodine TAKE 1 TABLET BY MOUTH DAILY   triamcinolone cream 0.1 % Commonly known as: KENALOG Apply 1 application topically 2 (two) times daily as needed (skin irritation). APPLY TO THE AFFECTED AREA(S) TWICE DAILY AS NEEDED   Victoza 18 MG/3ML Sopn Generic drug: liraglutide   Vitamin D (Ergocalciferol) 1.25 MG (50000 UNIT) Caps capsule Commonly known as: DRISDOL Take 50,000 Units by mouth once a week.   Xarelto 20 MG Tabs tablet Generic drug: rivaroxaban TAKE 1 TABLET(20 MG) BY MOUTH EVERY MORNING

## 2019-10-31 NOTE — Progress Notes (Signed)
Cardiology Office Note:    Date:  10/31/2019   ID:  CORIN FORMISANO, DOB December 21, 1965, MRN [id number]  PCP:  Leeanne Rio, MD  Cardiologist:  Elouise Munroe, MD  Electrophysiologist:  None   Referring MD: Leeanne Rio, MD   Chief Complaint: Preoperative cardiovascular risk assessment  History of Present Illness:    [name] is a 54 y.o. female with a history of morbid obesity, endometrial and ovarian cancer, lupus, hypertension, hyperlipidemia, DVT/PE who presents today for follow-up of cardiovascular stratification.  At our last visit we started amlodipine 5 mg daily for hypertension that was suboptimally controlled.  She is tolerating this well, blood pressure has improved.  Her blood pressure today is not reflective of medical therapy as she has not taken her medicines this morning.  She is currently taking amlodipine 5 mg a day and Lasix as needed swelling.  Echocardiogram showed preserved ejection fraction with mild LVH and grade 1 diastolic dysfunction, medial E prime 6 cm/second.  In the setting of history of PE and morbid obesity, right heart function was normal.  We discussed these reassuring results today.  She is currently doing water aerobics several times a week at the aquatic center, and denies exertional chest pain or shortness of breath.  Past Medical History:  Diagnosis Date  . Allergy   . Anemia   . Anxiety   . Arthritis   . Blood transfusion without reported diagnosis   . Cough   . Diverticulitis with perforation 2010  . DVT (deep vein thrombosis) in pregnancy 11/24/2016  . DVT, lower extremity ([place])   . Endometrial cancer ([place])   . Family history of adverse reaction to anesthesia    pts daughter had difficulty awakening following anesthesia, long time to wake up  . GERD (gastroesophageal reflux disease)   . Headache   . History of bronchitis   . History of ear infections   . Hx of migraines   . Hyperlipidemia   . Hypertension    . IBS (irritable bowel syndrome)   . Kidney infection   . Lupus (systemic lupus erythematosus) ([place]) 02/2017  . Morbid obesity ([place])   . Ovarian cancer ([place]) dx'd 01/2015  . PONV (postoperative nausea and vomiting)   . Pyelonephritis   . Scleroderma [facility])     Past Surgical History:  Procedure Laterality Date  . ABDOMINAL HYSTERECTOMY N/A 03/13/2015   Procedure: TOTAL HYSTERECTOMY ABDOMINAL BILATERAL SALPINGO OOPHORECTOMY RADICAL TUMOR [place];  Surgeon: Everitt Amber, MD;  Location: [facility];  Service: Gynecology;  Laterality: N/A;  . BOWEL RESECTION  03/13/2015   Procedure: SMALL BOWEL RESECTION;  Surgeon: Everitt Amber, MD;  Location: [facility];  Service: Gynecology;;  . [place] and 1984  . COLOSTOMY  2008  . COLOSTOMY TAKEDOWN    . ESOPHAGOGASTRODUODENOSCOPY (EGD) WITH PROPOFOL N/A 12/21/2018   Procedure: ESOPHAGOGASTRODUODENOSCOPY (EGD) WITH PROPOFOL;  Surgeon: Doran Stabler, MD;  Location: [facility];  Service: Gastroenterology;  Laterality: N/A;  . LAPAROTOMY N/A 03/13/2015   Procedure: EXPLORATORY LAPAROTOMY;  Surgeon: Everitt Amber, MD;  Location: [facility];  Service: Gynecology;  Laterality: N/A;  . TONSILLECTOMY AND ADENOIDECTOMY  1997  . VENTRAL HERNIA REPAIR  03/13/2015   Procedure: HERNIA REPAIR VENTRAL ADULT;  Surgeon: Everitt Amber, MD;  Location: [facility];  Service: Gynecology;;    Current Medications: Current Meds  Medication Sig  . acetaminophen (TYLENOL) 500 MG tablet Take 1,000 mg by mouth every 6 (six) hours as needed  for moderate pain.  Marland Kitchen albuterol (PROVENTIL) (2.5 MG/3ML) 0.083% nebulizer solution Take 3 mLs (2.5 mg total) by nebulization every 4 (four) hours as needed for wheezing or shortness of breath.  Marland Kitchen albuterol (PROVENTIL) (2.5 MG/3ML) 0.083% nebulizer solution Inhale into the lungs.  Marland Kitchen albuterol (VENTOLIN HFA) 108 (90 Base) MCG/ACT inhaler Inhale 2 puffs into the lungs every 6 (six) hours as needed for wheezing or shortness of breath. Please instruct  patient in usage.  Marland Kitchen amLODipine (NORVASC) 5 MG tablet Take 1 tablet (5 mg total) by mouth daily.  . B-D UF III MINI PEN NEEDLES 31G X 5 MM MISC   . cetirizine (ZYRTEC) 10 MG tablet Take 1 tablet (10 mg total) by mouth daily as needed for allergies (itching).  Marland Kitchen diclofenac Sodium (VOLTAREN) 1 % GEL Apply to knee once daily as needed  . furosemide (LASIX) 20 MG tablet Take by mouth.  . gabapentin (NEURONTIN) 100 MG capsule Take 1 capsule (100 mg total) by mouth daily as needed (neuropathy).  Marland Kitchen guaiFENesin (MUCINEX) 600 MG 12 hr tablet Take 1 tablet (600 mg total) by mouth 2 (two) times daily.  . Insulin Pen Needle 31G X 5 MM MISC Use as directed with Victoza  . lidocaine-prilocaine (EMLA) cream Apply 1 application topically as needed (prior to port being accessed).  . methocarbamol (ROBAXIN) 750 MG tablet Take 1 tablet (750 mg total) by mouth 3 (three) times daily as needed for muscle spasms.  Marland Kitchen omeprazole (PRILOSEC) 40 MG capsule Take 1 capsule (40 mg total) by mouth daily.  . TOVIAZ 8 MG TB24 tablet TAKE 1 TABLET BY MOUTH DAILY  . triamcinolone cream (KENALOG) 0.1 % Apply 1 application topically 2 (two) times daily as needed (skin irritation). APPLY TO THE AFFECTED AREA(S) TWICE DAILY AS NEEDED  . VICTOZA 18 MG/3ML SOPN   . Vitamin D, Ergocalciferol, (DRISDOL) 1.25 MG (50000 UNIT) CAPS capsule Take 50,000 Units by mouth once a week.  [name] 20 MG TABS tablet TAKE 1 TABLET(20 MG) BY MOUTH EVERY MORNING     Allergies:   Penicillins   Social History   Socioeconomic History  . Marital status: Legally Separated    Spouse name: Not on file  . Number of children: Not on file  . Years of education: Not on file  . Highest education level: Not on file  Occupational History  . Not on file  Tobacco Use  . Smoking status: Never Smoker  . Smokeless tobacco: Never Used  Vaping Use  . Vaping Use: Never used  Substance and Sexual Activity  . Alcohol use: Yes    Comment: Maybe wine every 6  months  . Drug use: No  . Sexual activity: Yes    Partners: Male    Comment: Told she could not have more children in 1986  Other Topics Concern  . 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   Married x 23 years, recently separated (4/15).  No domestic violence.  + financial stress.     Lives previously with husband who moved out, now with daughter who has medical problems, including Hodgkin's disease, and granddaughter (age 19).     Uses seat belt.    Social Determinants of Health   Financial Resource Strain:   . Difficulty of Paying Living Expenses:   Food Insecurity:   . Worried About Running  Out of Food in the Last Year:   . [place] in the Last Year:   Transportation Needs:   . Lack of Transportation (Medical):   Marland Kitchen Lack of Transportation (Non-Medical):   Physical Activity:   . Days of Exercise per Week:   . Minutes of Exercise per Session:   Stress:   . Feeling of Stress :   Social Connections:   . Frequency of Communication with Friends and Family:   . Frequency of Social Gatherings with Friends and Family:   . Attends Religious Services:   . Active Member of Clubs or Organizations:   . Attends Archivist Meetings:   Marland Kitchen Marital Status:      Family History: The patient's family history includes Asthma in her maternal aunt; Colon polyps in her mother; Diabetes in her daughter, mother, paternal aunt, paternal aunt, paternal grandfather, and paternal uncle; Fibroids in her mother; Hodgkin's lymphoma (age of onset: 55) in her daughter; Hyperlipidemia in her father and mother; Hypertension in her mother; Kidney failure (age of onset: 84) in her maternal grandmother; Pancreatic cancer in her paternal grandmother; Prostate cancer (age of onset: 22) in her maternal uncle. There is no history of Heart disease, Stroke, Colon cancer, Stomach cancer,  Esophageal cancer, or Rectal cancer.  ROS:   Please see the history of present illness.    All other systems reviewed and are negative.  EKGs/Labs/Other Studies Reviewed:    The following studies were reviewed today:  EKG:  N/a  Recent Labs: 02/19/2019: ALT 9 02/21/2019: Hemoglobin 9.4; Platelets 168 07/20/2019: BUN 22; Creatinine, Ser 1.20; Potassium 4.5; Sodium 141  Recent Lipid Panel    Component Value Date/Time   CHOL 241 (H) 01/01/2018 1010   TRIG 64 02/19/2019 1111   HDL 54 01/01/2018 1010   CHOLHDL 4.5 01/01/2018 1010   VLDL 19 01/01/2018 1010   LDLCALC 168 (H) 01/01/2018 1010   LDLDIRECT 164 (H) 07/26/2013 0933    Physical Exam:    VS:  BP (!) 141/80   Pulse 80   Temp (!) 97 F (36.1 C)   Ht 5' 5.5" (1.664 m)   Wt (!) 374 lb (169.6 kg)   LMP 10/26/2013   SpO2 100%   BMI 61.29 kg/m     Wt Readings from Last 5 Encounters:  10/31/19 (!) 374 lb (169.6 kg)  09/16/19 (!) 379 lb (171.9 kg)  08/23/19 (!) 382 lb 6.4 oz (173.5 kg)  08/16/19 (!) 391 lb 3.2 oz (177.4 kg)  07/19/19 (!) 392 lb 12.8 oz (178.2 kg)     Constitutional: No acute distress Eyes: sclera non-icteric, normal conjunctiva and lids ENMT: normal dentition, moist mucous membranes Cardiovascular: regular rhythm, normal rate, no murmurs. S1 and S2 normal. Radial pulses normal bilaterally. No jugular venous distention.  Respiratory: clear to auscultation bilaterally GI : normal bowel sounds, soft and nontender. No distention.   MSK: extremities warm, well perfused. No edema.  NEURO: grossly nonfocal exam, moves all extremities. PSYCH: alert and oriented x 3, normal mood and affect.   ASSESSMENT:    1. Pre-op evaluation   2. History of maternal pulmonary embolus   3. OSA (obstructive sleep apnea)   4. Essential hypertension, benign   5. Prediabetes    PLAN:    Pre-op evaluation  History of maternal pulmonary embolus  OSA (obstructive sleep apnea)  Essential hypertension,  benign  Prediabetes   The patient is low to intermediate risk for intermediate risk procedure.  No further  cardiovascular testing is required prior to the procedure.  If this level of risk is acceptable to the patient and surgical team, the patient should be considered optimized from a cardiovascular standpoint.  I have discussed this risk level with the patient, and she understands.  She would like to proceed.  Hypertension-currently on amlodipine 5 mg daily and as needed Lasix.  Will reevaluate antihypertensive regimen after weight loss surgery, currently stable.  History of pulmonary embolism-no evidence of right heart dysfunction or elevated pulmonary pressures on echo.  She is also asymptomatic.   Total time of encounter: 30 minutes total time of encounter, including 25 minutes spent in face-to-face patient care on the date of this encounter. This time includes coordination of care and counseling regarding above mentioned problem list. Remainder of non-face-to-face time involved reviewing chart documents/testing relevant to the patient encounter and documentation in the medical record. I have independently reviewed documentation from referring provider.   Cherlynn Kaiser, MD [place]  [name]    Medication Adjustments/Labs and Tests Ordered: Current medicines are reviewed at length with the patient today.  Concerns regarding medicines are outlined above.  No orders of the defined types were placed in this encounter.  No orders of the defined types were placed in this encounter.   Patient Instructions  Medication Instructions:  Your Physician recommend you continue on your current medication as directed.    *If you need a refill on your cardiac medications before your next appointment, please call your pharmacy*   Lab Work: None   Testing/Procedures: None   Follow-Up: At [facility], you and your health needs are our priority.  As part of our continuing  mission to provide you with exceptional heart care, we have created designated Provider Care Teams.  These Care Teams include your primary Cardiologist (physician) and Advanced Practice Providers (APPs -  Physician Assistants and Nurse Practitioners) who all work together to provide you with the care you need, when you need it.  We recommend signing up for the patient portal called "MyChart".  Sign up information is provided on this After Visit Summary.  MyChart is used to connect with patients for Virtual Visits (Telemedicine).  Patients are able to view lab/test results, encounter notes, upcoming appointments, etc.  Non-urgent messages can be sent to your provider as well.   To learn more about what you can do with MyChart, go to [email or web address].    Your next appointment:   3 month(s)  The format for your next appointment:   In Person  Provider:   Cherlynn Kaiser, MD

## 2019-11-01 DIAGNOSIS — G4733 Obstructive sleep apnea (adult) (pediatric): Secondary | ICD-10-CM | POA: Diagnosis not present

## 2019-11-01 NOTE — Telephone Encounter (Signed)
Form completed, will fax back to [place], MD

## 2019-11-01 NOTE — Telephone Encounter (Signed)
Patient calls nurse line checking status. Patient advised PCP has received all the information needed to complete form and will have it faxed off.   Patient reports they would like her 3 month blood work done by her PCP. Patient states, "PCP knows what this is." Patient reports she needs to get this done as soon as possible. Please place future orders and I will schedule a lab apt.

## 2019-11-03 NOTE — Telephone Encounter (Signed)
I don't know what she is referring to. She had labs drawn on 6/18 at [facility].  Is there a specific order patient is wanting me to place?  Thanks Leeanne Rio, MD

## 2019-11-15 NOTE — Telephone Encounter (Signed)
Pt states that [facility] told her that we would need to draw labs because of her insurance.  They would not be covered at [facility].  She will call them and have them send [place] what labs they need. Christen Bame, CMA

## 2019-11-17 DIAGNOSIS — Z6841 Body Mass Index (BMI) 40.0 and over, adult: Secondary | ICD-10-CM | POA: Diagnosis not present

## 2019-11-17 DIAGNOSIS — N183 Chronic kidney disease, stage 3 unspecified: Secondary | ICD-10-CM | POA: Diagnosis not present

## 2019-11-17 DIAGNOSIS — R7303 Prediabetes: Secondary | ICD-10-CM | POA: Diagnosis not present

## 2019-11-17 DIAGNOSIS — I10 Essential (primary) hypertension: Secondary | ICD-10-CM | POA: Diagnosis not present

## 2019-11-17 DIAGNOSIS — E559 Vitamin D deficiency, unspecified: Secondary | ICD-10-CM | POA: Diagnosis not present

## 2019-11-17 DIAGNOSIS — G4733 Obstructive sleep apnea (adult) (pediatric): Secondary | ICD-10-CM | POA: Diagnosis not present

## 2019-11-30 DIAGNOSIS — G4733 Obstructive sleep apnea (adult) (pediatric): Secondary | ICD-10-CM | POA: Diagnosis not present

## 2019-12-02 DIAGNOSIS — R1110 Vomiting, unspecified: Secondary | ICD-10-CM | POA: Diagnosis not present

## 2019-12-02 DIAGNOSIS — I10 Essential (primary) hypertension: Secondary | ICD-10-CM | POA: Diagnosis not present

## 2019-12-02 DIAGNOSIS — Z6841 Body Mass Index (BMI) 40.0 and over, adult: Secondary | ICD-10-CM | POA: Diagnosis not present

## 2019-12-02 DIAGNOSIS — G4733 Obstructive sleep apnea (adult) (pediatric): Secondary | ICD-10-CM | POA: Diagnosis not present

## 2019-12-15 DIAGNOSIS — N183 Chronic kidney disease, stage 3 unspecified: Secondary | ICD-10-CM | POA: Diagnosis not present

## 2019-12-15 DIAGNOSIS — I10 Essential (primary) hypertension: Secondary | ICD-10-CM | POA: Diagnosis not present

## 2019-12-15 DIAGNOSIS — Z6841 Body Mass Index (BMI) 40.0 and over, adult: Secondary | ICD-10-CM | POA: Diagnosis not present

## 2019-12-15 DIAGNOSIS — G4733 Obstructive sleep apnea (adult) (pediatric): Secondary | ICD-10-CM | POA: Diagnosis not present

## 2019-12-15 DIAGNOSIS — R7303 Prediabetes: Secondary | ICD-10-CM | POA: Diagnosis not present

## 2020-01-02 DIAGNOSIS — G4733 Obstructive sleep apnea (adult) (pediatric): Secondary | ICD-10-CM | POA: Diagnosis not present

## 2020-01-03 ENCOUNTER — Other Ambulatory Visit: Payer: Self-pay

## 2020-01-04 DIAGNOSIS — L0292 Furuncle, unspecified: Secondary | ICD-10-CM | POA: Insufficient documentation

## 2020-01-04 MED ORDER — TOVIAZ 8 MG PO TB24
8.0000 mg | ORAL_TABLET | Freq: Every day | ORAL | 0 refills | Status: DC
Start: 2020-01-04 — End: 2020-04-03

## 2020-01-04 NOTE — Progress Notes (Signed)
    SUBJECTIVE:   CHIEF COMPLAINT / HPI:   Rash: Patient presents to clinic today with recurrence of a rash she has had previously.  Reports that rash is located at the top of her gluteal region.  She reports that over the last several years she has had this 3 times.  She reports it is itchy, denies any pain associated with it.  To treat it she has been applying Neosporin onto a bandage and keeping the spot covered.  She denies any pain, burning sensation, fevers, body aches, or chills.  Denies any such lesions on her genital region.  Flu vaccine: Patient receiving flu vaccination today  Health maintenance: Patient due for hepatitis C screening, colonoscopy, tetanus shot, and Covid vaccine  PERTINENT  PMH / PSH:  Hypertension CKD 3 History of PE OSA  OBJECTIVE:   Wt (!) 363 lb 4 oz (164.8 kg)   LMP 10/26/2013   BMI 59.53 kg/m    Physical exam: General: Pleasant patient, no apparent distress, nontoxic-appearing Respiratory: CTA bilaterally, comfortable work of breathing Cardio: RRR, S1-S2 present, no murmurs appreciated Integumentary: See photo below    Photo shows purulent material and vesicles on erythematous base to left gluteal region with ulcerated lesion appreciated to region superior to gluteal cleft.   ASSESSMENT/PLAN:   Herpes simplex virus (HSV) infection of buttock Rash is most consistent with appearance of herpes simplex infection on her skin.  She denies having this in the genital region.  Reports she has had this 3 times in the past.  Denies any pain.  Also considered shingles as possible cause however would expect this to cause her significant pain.  Patient reports the lesions only last about 5 days until he self resolved. -Patient keep the area clean and covered with bandage and bacitracin/Neosporin. -Could consider treating patient with Valtrex to prevent future outbreaks. -Encouraged her to keep the site clean and to wash her hands after touching it  Need  for immunization against influenza -Patient received flu vaccination today -Strongly encourage patient to receive Covid vaccination     Daisy Floro, [place]

## 2020-01-05 ENCOUNTER — Other Ambulatory Visit: Payer: Self-pay

## 2020-01-05 ENCOUNTER — Ambulatory Visit (INDEPENDENT_AMBULATORY_CARE_PROVIDER_SITE_OTHER): Payer: Medicare HMO | Admitting: Family Medicine

## 2020-01-05 ENCOUNTER — Encounter: Payer: Self-pay | Admitting: Family Medicine

## 2020-01-05 VITALS — Wt 363.2 lb

## 2020-01-05 DIAGNOSIS — B0089 Other herpesviral infection: Secondary | ICD-10-CM | POA: Insufficient documentation

## 2020-01-05 DIAGNOSIS — B009 Herpesviral infection, unspecified: Secondary | ICD-10-CM | POA: Diagnosis not present

## 2020-01-05 DIAGNOSIS — Z23 Encounter for immunization: Secondary | ICD-10-CM | POA: Diagnosis not present

## 2020-01-05 NOTE — Assessment & Plan Note (Signed)
-  Patient received flu vaccination today -Strongly encourage patient to receive Covid vaccination

## 2020-01-05 NOTE — Assessment & Plan Note (Signed)
Rash is most consistent with appearance of herpes simplex infection on her skin.  She denies having this in the genital region.  Reports she has had this 3 times in the past.  Denies any pain.  Also considered shingles as possible cause however would expect this to cause her significant pain.  Patient reports the lesions only last about 5 days until he self resolved. -Patient keep the area clean and covered with bandage and bacitracin/Neosporin. -Could consider treating patient with Valtrex to prevent future outbreaks. -Encouraged her to keep the site clean and to wash her hands after touching it

## 2020-01-05 NOTE — Patient Instructions (Signed)
Thank you for coming in to see [place] today! Please see below to review our plan for today's visit:  1. Keep the rash cleaned with alcohol wipes. This will help to dry it out.  2. Keep it covered with Bacitracin and gauze/tape.  3. Keep monitoring this site. If it grows, becomes painful, or you develop fevers, body aches, chills, please let us know.   Please call the clinic at [phone] if your symptoms worsen or you have any concerns. It was our pleasure to serve you!   Dr. Milus Banister [facility]

## 2020-01-10 DIAGNOSIS — D649 Anemia, unspecified: Secondary | ICD-10-CM | POA: Diagnosis not present

## 2020-01-10 DIAGNOSIS — R7303 Prediabetes: Secondary | ICD-10-CM | POA: Diagnosis not present

## 2020-01-10 DIAGNOSIS — N183 Chronic kidney disease, stage 3 unspecified: Secondary | ICD-10-CM | POA: Diagnosis not present

## 2020-01-10 DIAGNOSIS — K219 Gastro-esophageal reflux disease without esophagitis: Secondary | ICD-10-CM | POA: Diagnosis not present

## 2020-01-10 DIAGNOSIS — Z6841 Body Mass Index (BMI) 40.0 and over, adult: Secondary | ICD-10-CM | POA: Diagnosis not present

## 2020-01-10 DIAGNOSIS — Z79899 Other long term (current) drug therapy: Secondary | ICD-10-CM | POA: Diagnosis not present

## 2020-01-10 DIAGNOSIS — I10 Essential (primary) hypertension: Secondary | ICD-10-CM | POA: Diagnosis not present

## 2020-01-10 DIAGNOSIS — I2699 Other pulmonary embolism without acute cor pulmonale: Secondary | ICD-10-CM | POA: Diagnosis not present

## 2020-01-10 DIAGNOSIS — E785 Hyperlipidemia, unspecified: Secondary | ICD-10-CM | POA: Diagnosis not present

## 2020-01-10 DIAGNOSIS — G4733 Obstructive sleep apnea (adult) (pediatric): Secondary | ICD-10-CM | POA: Diagnosis not present

## 2020-01-10 DIAGNOSIS — Z7901 Long term (current) use of anticoagulants: Secondary | ICD-10-CM | POA: Diagnosis not present

## 2020-01-10 DIAGNOSIS — E559 Vitamin D deficiency, unspecified: Secondary | ICD-10-CM | POA: Diagnosis not present

## 2020-01-31 ENCOUNTER — Ambulatory Visit (INDEPENDENT_AMBULATORY_CARE_PROVIDER_SITE_OTHER): Payer: Medicare HMO | Admitting: Internal Medicine

## 2020-01-31 ENCOUNTER — Encounter: Payer: Self-pay | Admitting: Internal Medicine

## 2020-01-31 ENCOUNTER — Other Ambulatory Visit: Payer: Self-pay

## 2020-01-31 VITALS — BP 104/70 | HR 78 | Ht 65.5 in | Wt 362.0 lb

## 2020-01-31 DIAGNOSIS — I10 Essential (primary) hypertension: Secondary | ICD-10-CM

## 2020-01-31 DIAGNOSIS — E785 Hyperlipidemia, unspecified: Secondary | ICD-10-CM | POA: Diagnosis not present

## 2020-01-31 DIAGNOSIS — G4733 Obstructive sleep apnea (adult) (pediatric): Secondary | ICD-10-CM | POA: Diagnosis not present

## 2020-01-31 NOTE — Patient Instructions (Signed)
Medication Instructions:  No Changes In Medications at this time.  *If you need a refill on your cardiac medications before your next appointment, please call your pharmacy*  Lab Work: None Ordered At This Time.  If you have labs (blood work) drawn today and your tests are completely normal, you will receive your results only by: . MyChart Message (if you have MyChart) OR . A paper copy in the mail If you have any lab test that is abnormal or we need to change your treatment, we will call you to review the results.  Testing/Procedures: None Ordered At This Time.   Follow-Up: At [facility], you and your health needs are our priority.  As part of our continuing mission to provide you with exceptional heart care, we have created designated Provider Care Teams.  These Care Teams include your primary Cardiologist (physician) and Advanced Practice Providers (APPs -  Physician Assistants and Nurse Practitioners) who all work together to provide you with the care you need, when you need it.  We recommend signing up for the patient portal called "MyChart".  Sign up information is provided on this After Visit Summary.  MyChart is used to connect with patients for Virtual Visits (Telemedicine).  Patients are able to view lab/test results, encounter notes, upcoming appointments, etc.  Non-urgent messages can be sent to your provider as well.   To learn more about what you can do with MyChart, go to [email or web address].    Your next appointment:   6 month(s)  The format for your next appointment:   In Person  Provider:   Gayatri Acharya, MD  
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
## 2020-03-01 ENCOUNTER — Other Ambulatory Visit: Payer: Self-pay | Admitting: Family Medicine

## 2020-03-05 ENCOUNTER — Telehealth: Payer: Self-pay

## 2020-03-08 MED ORDER — ALBUTEROL SULFATE (2.5 MG/3ML) 0.083% IN NEBU: 2.5000 mg | INHALATION_SOLUTION | RESPIRATORY_TRACT | 0 refills | Status: DC | PRN

## 2020-03-08 NOTE — Telephone Encounter (Signed)
Pt already has an appt scheduled for 12/9. Temple Ewart Kennon Holter, CMA

## 2020-03-08 NOTE — Telephone Encounter (Signed)
Please ask patient to schedule a follow up visit with me, thanks! [name] J Alison Gatley, MD  

## 2020-03-15 ENCOUNTER — Encounter: Payer: Self-pay | Admitting: Family Medicine

## 2020-03-15 ENCOUNTER — Ambulatory Visit (INDEPENDENT_AMBULATORY_CARE_PROVIDER_SITE_OTHER): Payer: Medicare HMO | Admitting: Family Medicine

## 2020-03-15 ENCOUNTER — Other Ambulatory Visit: Payer: Self-pay

## 2020-03-15 ENCOUNTER — Other Ambulatory Visit (HOSPITAL_COMMUNITY)
Admission: RE | Admit: 2020-03-15 | Discharge: 2020-03-15 | Disposition: A | Discharge status: Home / Self Care | Payer: Medicare HMO | Source: Ambulatory Visit | Attending: Family Medicine | Admitting: Family Medicine

## 2020-03-15 VITALS — BP 100/70 | HR 91 | Ht 66.0 in | Wt 365.0 lb

## 2020-03-15 DIAGNOSIS — B009 Herpesviral infection, unspecified: Secondary | ICD-10-CM | POA: Diagnosis not present

## 2020-03-15 DIAGNOSIS — Z7901 Long term (current) use of anticoagulants: Secondary | ICD-10-CM

## 2020-03-15 DIAGNOSIS — Z1211 Encounter for screening for malignant neoplasm of colon: Secondary | ICD-10-CM | POA: Diagnosis not present

## 2020-03-15 DIAGNOSIS — Z23 Encounter for immunization: Secondary | ICD-10-CM

## 2020-03-15 DIAGNOSIS — Z1159 Encounter for screening for other viral diseases: Secondary | ICD-10-CM

## 2020-03-15 DIAGNOSIS — Z202 Contact with and (suspected) exposure to infections with a predominantly sexual mode of transmission: Secondary | ICD-10-CM

## 2020-03-15 DIAGNOSIS — R32 Unspecified urinary incontinence: Secondary | ICD-10-CM

## 2020-03-15 DIAGNOSIS — B0089 Other herpesviral infection: Secondary | ICD-10-CM

## 2020-03-15 MED ORDER — SHINGRIX 50 MCG/0.5ML IM SUSR: 0.5000 mL | Freq: Once | INTRAMUSCULAR | 1 refills | Status: AC

## 2020-03-15 MED ORDER — TETANUS-DIPHTH-ACELL PERTUSSIS 5-2.5-18.5 LF-MCG/0.5 IM SUSY: 0.5000 mL | PREFILLED_SYRINGE | Freq: Once | INTRAMUSCULAR | 0 refills | Status: AC
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
## 2020-03-15 NOTE — Patient Instructions (Signed)
It was great to see you again today.  Doing testing today - HIV, syphilis, herpes, gonorrhea/chlamydia, hepatitis C  COVID booster today  See printed prescriptions for tetanus and shingles vaccine  Ordered cologuard for you - you should get this in the mail and can send it back in to them with your sample  Be well, Dr. [name]

## 2020-03-16 LAB — HIV ANTIBODY (ROUTINE TESTING W REFLEX): HIV Screen 4th Generation wRfx: NONREACTIVE

## 2020-03-16 LAB — HSV(HERPES SIMPLEX VRS) I + II AB-IGG
HSV 1 Glycoprotein G Ab, IgG: 1.44 index — ABNORMAL HIGH (ref 0.00–0.90)
HSV 2 IgG, Type Spec: 10.7 index — ABNORMAL HIGH (ref 0.00–0.90)

## 2020-03-16 LAB — CERVICOVAGINAL ANCILLARY ONLY
Chlamydia: NEGATIVE
Comment: NEGATIVE
Comment: NEGATIVE
Comment: NORMAL
Neisseria Gonorrhea: NEGATIVE
Trichomonas: NEGATIVE

## 2020-03-16 LAB — HCV INTERPRETATION

## 2020-03-16 LAB — HCV AB W REFLEX TO QUANT PCR: HCV Ab: <0.1 s/co ratio (ref 0.0–0.9)

## 2020-03-16 LAB — RPR: RPR Ser Ql: NONREACTIVE

## 2020-03-16 NOTE — Assessment & Plan Note (Signed)
No swabs apparently sent at last visit, but photo from that time is very typical of herpes. Discussed with patient at length today. Reviewed information about etiology of herpes (HSV 1 vs 2), transmission, options for confirming diagnosis, and treatment. After extended discussion patient elected to get HSV IgG antibodies today; if positive will lend more support for HSV diagnosis. Explained need to counsel future partners about condition, and risk of transmission even when no outbreak is present. Gave handout on HSV. Also doing full STD screening - HIV, RPR, gc/chl/trich today Patient appreciative of explanation and was reassured

## 2020-03-16 NOTE — Assessment & Plan Note (Signed)
Stable on xarelto. No bleeding. Continue xarelto

## 2020-03-17 DIAGNOSIS — R32 Unspecified urinary incontinence: Secondary | ICD-10-CM | POA: Insufficient documentation

## 2020-03-17 NOTE — Assessment & Plan Note (Signed)
Stable on toviaz, continue 

## 2020-03-21 ENCOUNTER — Telehealth: Payer: Self-pay

## 2020-03-21 MED ORDER — VALACYCLOVIR HCL 500 MG PO TABS: 500.0000 mg | ORAL_TABLET | Freq: Two times a day (BID) | ORAL | 2 refills | Status: DC

## 2020-03-21 NOTE — Telephone Encounter (Signed)
Patient LVM on nurse line regarding rash breakout. Called patient to discuss further. Patient is unable to discuss in detail as she was in the store during phone call. Patient reports that it is similar to what she was seen for in the office on 03/15/20.   Please advise next steps for patient.   Talbot Grumbling, RN

## 2020-03-21 NOTE — Telephone Encounter (Signed)
Called patient - this is a genital herpes outbreak similar to what she's had in the past. Sent in valtrex 500mg  twice daily for 3 days. Patient appreciative.  Leeanne Rio, MD

## 2020-03-31 ENCOUNTER — Other Ambulatory Visit: Payer: Self-pay | Admitting: Family Medicine

## 2020-04-03 ENCOUNTER — Encounter: Payer: Self-pay | Admitting: Family Medicine

## 2020-04-12 ENCOUNTER — Other Ambulatory Visit: Payer: Self-pay | Admitting: Gastroenterology

## 2020-04-20 LAB — COLOGUARD

## 2020-04-30 ENCOUNTER — Other Ambulatory Visit: Payer: Self-pay | Admitting: Family Medicine

## 2020-04-30 DIAGNOSIS — Z1231 Encounter for screening mammogram for malignant neoplasm of breast: Secondary | ICD-10-CM

## 2020-05-03 ENCOUNTER — Other Ambulatory Visit: Payer: Self-pay

## 2020-05-03 ENCOUNTER — Ambulatory Visit
Admission: RE | Admit: 2020-05-03 | Discharge: 2020-05-03 | Disposition: A | Discharge status: Home / Self Care | Payer: Medicare HMO | Source: Ambulatory Visit | Attending: Family Medicine | Admitting: Family Medicine

## 2020-05-03 DIAGNOSIS — Z1231 Encounter for screening mammogram for malignant neoplasm of breast: Secondary | ICD-10-CM

## 2020-05-17 ENCOUNTER — Telehealth: Payer: Self-pay

## 2020-05-17 NOTE — Telephone Encounter (Signed)
Patient calls nurse line regarding productive cough and mild wheezing. Patient has been using albuterol inhalers and nebulizer treatments, that have been helping. Patient reports that coughing episodes are worse at night. Patient denies [facility] and chest pain.   Scheduled patient in ATC tomorrow afternoon.   Strict ED precautions given.   Talbot Grumbling, RN

## 2020-05-18 ENCOUNTER — Ambulatory Visit (INDEPENDENT_AMBULATORY_CARE_PROVIDER_SITE_OTHER): Payer: Medicare HMO | Admitting: Family Medicine

## 2020-05-18 VITALS — BP 125/74 | HR 92 | Temp 99.3°F | Ht 66.0 in

## 2020-05-18 DIAGNOSIS — J00 Acute nasopharyngitis [common cold]: Secondary | ICD-10-CM | POA: Diagnosis not present

## 2020-05-18 NOTE — Patient Instructions (Signed)
Thank you for coming in to see [place] today! Please see below to review our plan for today's visit:  1. Keep your humidifier running, Tylenol for fevers/chills (500mg  three times daily).  2. Stay hydrated - peeing clear! You can continue to use DayQuil/NyQuil as needed. Honey 1 tablespoon three times daily for cough suppressant.  3. If you develop shortness of breath, severe fevers, inability to keep food down, inability to stay hydrated, weakness, lethargy, etc. Please either call us/come back to see [place] or go to the ED.   We swabbed you for COVID today and will follow up results.   Please call the clinic at [phone] if your symptoms worsen or you have any concerns. It was our pleasure to serve you!   Dr. Milus Banister [facility]

## 2020-05-18 NOTE — Assessment & Plan Note (Addendum)
1. Keep your humidifier running, Tylenol for fevers/chills (500mg  three times daily).  2. Stay hydrated - peeing clear! You can continue to use DayQuil/NyQuil as needed. Honey 1 tablespoon three times daily for cough suppressant.  3. If you develop shortness of breath, severe fevers, inability to keep food down, inability to stay hydrated, weakness, lethargy, etc. Please either call us/come back to see [place] or go to the ED. 4.  Update: Patient swabbed for both flu and COVID, both negative, patient notified
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
## 2020-05-20 LAB — COVID-19, FLU A+B NAA
Influenza A, NAA: NOT DETECTED
Influenza B, NAA: NOT DETECTED
SARS-CoV-2, NAA: NOT DETECTED

## 2020-05-21 ENCOUNTER — Other Ambulatory Visit: Payer: Self-pay

## 2020-05-21 MED ORDER — ALBUTEROL SULFATE (2.5 MG/3ML) 0.083% IN NEBU
2.5000 mg | INHALATION_SOLUTION | RESPIRATORY_TRACT | 0 refills | Status: DC | PRN
Start: 2020-05-21 — End: 2020-07-21

## 2020-05-21 NOTE — Telephone Encounter (Signed)
Patient calls nurse line regarding lab results from appointment on Friday, 2/11. Informed patient of negative COVID and flu results. Patient reports that she has continued to have cough and low grade fever (tmax 100.3) over the weekend.   Patient is still requesting refill of albuterol solution for nebulizer to help with cough.   Encouraged patient to continue symptom management.   To PCP  Talbot Grumbling, RN

## 2020-06-06 ENCOUNTER — Ambulatory Visit (INDEPENDENT_AMBULATORY_CARE_PROVIDER_SITE_OTHER): Payer: Medicare HMO | Admitting: Gastroenterology

## 2020-06-06 ENCOUNTER — Encounter: Payer: Self-pay | Admitting: Gastroenterology

## 2020-06-06 VITALS — BP 122/72 | HR 79 | Ht 66.0 in | Wt 369.0 lb

## 2020-06-06 DIAGNOSIS — K219 Gastro-esophageal reflux disease without esophagitis: Secondary | ICD-10-CM

## 2020-06-06 DIAGNOSIS — Z1211 Encounter for screening for malignant neoplasm of colon: Secondary | ICD-10-CM

## 2020-06-06 DIAGNOSIS — K449 Diaphragmatic hernia without obstruction or gangrene: Secondary | ICD-10-CM

## 2020-06-06 MED ORDER — PLENVU 140 G PO SOLR: ORAL | 0 refills | Status: DC

## 2020-06-06 NOTE — Progress Notes (Signed)
[facility] Progress Note  Chief Complaint: Colon cancer screening  Subjective  History: I last saw around in January of 2021 to discuss colon cancer screening. I previously seen her for reflux symptoms upper endoscopy, after which I recommended she seek consultation for bariatric surgery. When I saw her last, she was in the process of being considered for a bariatric procedure, and understood from that clinic that she needed a screening colonoscopy. We were planning it for last February, then it was canceled after some uncertainty regarding the management of preprocedure anticoagulation (see chart notes with me in PCP). It was then rescheduled to May of last year, and then further communication with patient and primary care indicated that the bariatric program did not require preprocedure colonoscopy. [name] was planning to proceed with bariatric surgery, and then hopefully have significant weight loss and contact us when she felt ready for screening colonoscopy. When we heard from her again in the last couple of months I scheduled an office visit.  Bariatric clinic ([facility]) telemedicine note from 2 2322 was reviewed. They required the patient have some further reduction in BMI before proceeding with bariatric surgery.  PCP ordered cologuard, but then heard that specimen was insufficient.  She was apparently sent another kit and plans to do that soon.  However, she also wants to proceed with colonoscopy because she feels it would be a better screening test from what she was reading.  [name] is still bothered by regurgitation and pyrosis, especially overnight and she sometimes has to sleep sitting upright because of that and her sleep apnea.  She is looking forward to getting bariatric surgery performed after some weight loss, and thinks that might occur by the summer. She denies dysphagia odynophagia or vomiting.  She sometimes feels food is "not digesting", which seems to mean bloating or  regurgitation  (15 minutes late to today's appointment) ROS: Cardiovascular:  no chest pain Respiratory: no dyspnea Arthralgias Remainder of systems negative except as above The patient's Past Medical, Family and Social History were reviewed and are on file in the EMR.  Oct 2021 Cardiology note reviewed - no mention of pre-procedure [facility] management.  Objective:  Med list reviewed  Current Outpatient Medications:  .  acetaminophen (TYLENOL) 500 MG tablet, Take 1,000 mg by mouth every 6 (six) hours as needed for moderate pain., Disp: , Rfl:  .  albuterol (PROVENTIL) (2.5 MG/3ML) 0.083% nebulizer solution, Take 3 mLs (2.5 mg total) by nebulization every 4 (four) hours as needed for wheezing or shortness of breath., Disp: 75 mL, Rfl: 0 .  albuterol (VENTOLIN HFA) 108 (90 Base) MCG/ACT inhaler, Inhale 2 puffs into the lungs every 6 (six) hours as needed for wheezing or shortness of breath. Please instruct patient in usage., Disp: 18 g, Rfl: 3 .  amLODipine (NORVASC) 5 MG tablet, Take 1 tablet (5 mg total) by mouth daily., Disp: 90 tablet, Rfl: 3 .  B-D UF Thompson MINI PEN NEEDLES 31G X 5 MM MISC, , Disp: , Rfl:  .  cetirizine (ZYRTEC) 10 MG tablet, Take 1 tablet (10 mg total) by mouth daily as needed for allergies (itching)., Disp: 30 tablet, Rfl: 0 .  diclofenac Sodium (VOLTAREN) 1 % GEL, Apply to knee once daily as needed, Disp: 100 g, Rfl: 1 .  furosemide (LASIX) 20 MG tablet, Take 20 mg by mouth as needed., Disp: , Rfl:  .  gabapentin (NEURONTIN) 100 MG capsule, Take 1 capsule (100 mg total) by mouth daily as  needed (neuropathy)., Disp: 30 capsule, Rfl: 2 .  Insulin Pen Needle 31G X 5 MM MISC, Use as directed with Victoza, Disp: , Rfl:  .  omeprazole (PRILOSEC) 40 MG capsule, Take 1 capsule (40 mg total) by mouth daily., Disp: 30 capsule, Rfl: 2 .  TOVIAZ 8 MG TB24 tablet, TAKE 1 TABLET(8 MG) BY MOUTH DAILY, Disp: 90 tablet, Rfl: 2 .  triamcinolone cream (KENALOG) 0.1 %, Apply 1 application  topically 2 (two) times daily as needed (skin irritation). APPLY TO THE AFFECTED AREA(S) TWICE DAILY AS NEEDED, Disp: 45 g, Rfl: 0 .  valACYclovir (VALTREX) 500 MG tablet, Take 1 tablet (500 mg total) by mouth 2 (two) times daily. For 3 days at start of outbreak, Disp: 6 tablet, Rfl: 2 .  VICTOZA 18 MG/3ML SOPN, 18 mg daily., Disp: , Rfl:  .  Vitamin D, Ergocalciferol, (DRISDOL) 1.25 MG (50000 UNIT) CAPS capsule, Take 50,000 Units by mouth once a week., Disp: , Rfl:  .  XARELTO 20 MG TABS tablet, TAKE 1 TABLET(20 MG) BY MOUTH EVERY MORNING, Disp: 90 tablet, Rfl: 1 No current facility-administered medications for this visit.  Facility-Administered Medications Ordered in Other Visits:  .  sodium chloride flush (NS) 0.9 % injection 10 mL, 10 mL, Intravenous, PRN, Thompson, Alison P, MD, 10 mL at 05/31/15 0300 .  sodium chloride flush (NS) 0.9 % injection 10 mL, 10 mL, Intravenous, PRN, Thompson, Alison P, MD, 10 mL at 08/02/15 1442   Vital signs in last 24 hrs: Vitals:   06/06/20 0939  BP: 122/72  Pulse: 79   Wt Readings from Last 3 Encounters:  06/06/20 (!) 369 lb (167.4 kg)  03/15/20 (!) 365 lb (165.6 kg)  01/31/20 (!) 362 lb (164.2 kg)    Physical Exam  Well-appearing, antalgic gait, gets on exam table without difficulty or assistance  HEENT: sclera anicteric, oral mucosa moist without lesions  Neck: supple, no thyromegaly, JVD or lymphadenopathy  Cardiac: RRR without murmurs, S1S2 heard, no peripheral edema  Pulm: clear to auscultation bilaterally, normal RR and effort noted  Abdomen: soft, no tenderness, with active bowel sounds. No guarding or palpable hepatosplenomegaly, limited by body habitus  Skin; warm and dry, no jaundice or rash  Labs:  CBC Latest Ref Rng & Units 02/21/2019 02/20/2019 02/19/2019  WBC 4.0 - 10.5 K/uL 8.3 9.2 11.9(H)  Hemoglobin 12.0 - 15.0 g/dL 9.4(L) 9.3(L) 11.0(L)  Hematocrit 36.0 - 46.0 % 30.7(L) 30.9(L) 35.5(L)  Platelets 150 - 400 K/uL 168 155  173   CMP Latest Ref Rng & Units 07/20/2019 02/21/2019 02/20/2019  Glucose 65 - 99 mg/dL 98 103(H) 109(H)  BUN 6 - 24 mg/dL '22 17 16  ' Creatinine 0.57 - 1.00 mg/dL 1.20(H) 1.29(H) 1.15(H)  Sodium 134 - 144 mmol/L 141 141 140  Potassium 3.5 - 5.2 mmol/L 4.5 4.0 4.0  Chloride 96 - 106 mmol/L 105 108 107  CO2 20 - 29 mmol/L '21 25 26  ' Calcium 8.7 - 10.2 mg/dL 9.8 8.6(L) 8.5(L)  Total Protein 6.5 - 8.1 g/dL - - -  Total Bilirubin 0.3 - 1.2 mg/dL - - -  Alkaline Phos 38 - 126 U/L - - -  AST 15 - 41 U/L - - -  ALT 0 - 44 U/L - - -   GFR 59  Normal gastric emptying study August 2020 ___________________________________________ Radiologic studies:   ____________________________________________ Other:  EGD September 2020 with approximately 5 cm hiatal hernia with paraesophageal component, otherwise normal study  _____________________________________________ Assessment & Plan  Assessment: Encounter Diagnoses  Name Primary?  . Gastroesophageal reflux disease without esophagitis Yes  . Special screening for malignant neoplasms, colon   . Hiatal hernia    Alberto's GERD symptoms are under suboptimal control due to a hiatal hernia and morbid obesity.  I expect they will improve with a bariatric procedure as long as she undergoes hiatal hernia repair as well.  Regarding colon cancer screening, I agree that a colonoscopy is a better screening test, albeit more invasive and with risks.  Those risks were again outlined as cardiac and respiratory risks related to sedation, perforation bleeding and organ injury. Since she was agreeable to doing that, I advised her not to do the Cologuard test.  Patient at increased risk for cardiopulmonary complications of procedure due to medical comorbidities.   However, there is still a matter of handling her preprocedure anticoagulation.  It should be noted that she had a brief hold of Xarelto before 2020 upper endoscopy with no bridging Lovenox and did fine.   I understand she is considered to be at increased risk of DVT/PE, and that primary care recommends bridging Lovenox therapy.  I am in agreement with that plan, and will again ask primary care for some assistance managing that.  My note will be forwarded to Dr. [name] to open a dialogue about that.  This note will also be forwarded to the patient's bariatric surgeon.  32 minutes were spent on this encounter (including chart review, history/exam, counseling/coordination of care, and documentation) > 50% of that time was spent on counseling and coordination of care.  Topics discussed included: Colon cancer screening, colonoscopy risks and benefits, reflux and hiatal hernia.  [name]

## 2020-06-06 NOTE — Patient Instructions (Signed)
We have sent the following medications to your pharmacy for you to pick up at your convenience:Plenvu  Please pick up a box of Dulcolax tablets from the laxative section of the drug store.    We will contact you with instructions for holding your Xarelto.  If you do not hear from [place] by 07/06/20.  Please call [phone].   You have been scheduled for a colonoscopy. Please follow written instructions given to you at your visit today.  Please pick up your prep supplies at the pharmacy within the next 1-3 days. If you use inhalers (even only as needed), please bring them with you on the day of your procedure.  Due to recent changes in healthcare laws, you may see the results of your imaging and laboratory studies on MyChart before your provider has had a chance to review them.  We understand that in some cases there may be results that are confusing or concerning to you. Not all laboratory results come back in the same time frame and the provider may be waiting for multiple results in order to interpret others.  Please give [place] 48 hours in order for your provider to thoroughly review all the results before contacting the office for clarification of your results.   If you are age 56 or older, your body mass index should be between 23-30. Your Body mass index is 59.56 kg/m. If this is out of the aforementioned range listed, please consider follow up with your Primary Care Provider.  If you are age 38 or younger, your body mass index should be between 19-25. Your Body mass index is 59.56 kg/m. If this is out of the aformentioned range listed, please consider follow up with your Primary Care Provider.

## 2020-06-07 ENCOUNTER — Other Ambulatory Visit: Payer: Self-pay | Admitting: Family Medicine

## 2020-06-14 ENCOUNTER — Telehealth: Payer: Self-pay

## 2020-06-14 ENCOUNTER — Encounter: Payer: Self-pay | Admitting: Gynecologic Oncology

## 2020-06-14 NOTE — Telephone Encounter (Signed)
This is fine to hold xarelto x2 days prior to procedure.  Thanks! Leeanne Rio, MD

## 2020-06-14 NOTE — Telephone Encounter (Signed)
   [name] [birth year] [id number]  Dear [name]:  We have scheduled the above named patient for a(n) colonoscopy  procedure. Our records show that (s)he is on anticoagulation therapy.  Please advise as to whether the patient may come off their therapy of Xarelto 2 days prior to their procedure which is scheduled for 07/19/20.  Please see Dr. Wilfrid Lund office note from 06/06/20. Marland Kitchen  Please route your response to Barb Merino, RN   Sincerely,    [facility]

## 2020-06-14 NOTE — Progress Notes (Signed)
Followup of Consult: Gyn-Onc  CC:  Chief Complaint  Patient presents with  . Ovarian carcinosarcoma, right ([place])  . Endometrial ca [facility])    Assessment/Plan:  [name]  is a 55 y.o.  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  She is s/p exploratory laparotomy and TAH, BSO, optimal cytoreduction to no gross residual visible disease on 03/13/15. S/p 6 cycles of adjuvant carboplatin and paclitaxel chemotherapy completed 08/02/15. Genetic testing BRCA negative. Complete clinical response (no evidence of disease).   Hx of PE in May, 2017, with recurrent DVT and PE in October, 2018 - on Lovenox. - CA 125 normal in 2021 - will check again today.   HPI: [name] is a very pleasant 55 year old woman with history of 2 prior cesarean sections who is seen initially in the hospital as an inpatient consultation at [facility] on 02/19/2015 for complex pelvic masses. The patient is a morbidly obese woman with a BMI 62 kg/m. She was admitted to [facility] on 02/18/2015 with pyelonephritis. CT imaging and then MRI imaging was performed to evaluate for an etiology for pyelonephritis. This identified A large pelvic mass which it to be arising from the right adnexa. It measured 13 x 9 x 17.5 cm and was irregular cystic and solid in nature. A second, but cystic and solid lesion within the left adnexa measuring 4.4 x 6.5 x 5.3 cm. In the third cystic and solid mass was seen anterior to the uterus and superior to the bladder measuring 4.2 x 5.4 x 5.3 cm. She had no pelvic lymphadenopathy, no carcinomatosis, and CA 125 was normal at 21U/mL.  Her infection was adequately treated with IV antibiotics and her acute kidney injury which manifested with an elevated creatinine 12.4 spontaneously resolved during her hospital stay with avoidance of NSAID and good hydration. She was noted to be anemic during her hospitalization with a hemoglobin of 10.8.  However this did not require transfusion and spontaneously improved over time.   On 03/13/2015 she underwent an exploratory laparotomy TAH/BSO and radical tumor debulking for a stage IIIc carcinosarcoma of the right ovary and an incidentally found synchronous stage I a endometrial cancer. Tumor was found with a large right ovarian mass, smaller left ovarian mass and a carcinosarcoma implant was involving the anterior cul-de-sac between the bladder and lower uterine segment. All disease sites were completely resected with no macroscopic disease remaining at the completion of debulking.   She went on to receive 6 cycles of adjuvant chemotherapy with carboplatin and paclitaxel between 04/12/15 and 08/02/15. She tolerated treatment well with some neutropenia. CA 125 was normal at 4.1 at completion of therapy in March, 2017. On 08/23/15 she underwent post-treatment CT imaging which showed: there is a 2.5 x 1.6 cm soft tissue nodule near the vaginal cuff. Within the right hemipelvis there is a 1.8 cm soft tissue nodule.   Repeat CT on 11/21/15 showed that the small bilateral deep pelvic side wall soft tissue masses have decreased in size and are less suggestive of persistent/recurrent disease.  She has a stable small left ventral hernia.   CA 125 in October, 2017 normal.  CT abdo/pelvis in May, 2018 showed no evidence of recurrent disease and CA 125 in May, 2018 was normal at 3.4.  In September, 2018 was normal at 3.  In October 2018 she was hospitalized with a left lower extremity DVT and PE. She was started on Lovenox. CT chest showed no  lesions consistent with metastatic cancer.  CA 125 in December, 2018 normal at 3.   CA 125 in December, 2019 was normal at 3.5. CT abd/pelvis on 03/12/18 showed no evidence of recurrent disease.   CA 125 on 09/27/18 was normal at 3.7.  Interval History:  A diagnosis of lupus was retracted after evaluation with rheumatology. She is taking injections for diabetes.  CT  scan on 08/24/19 showed no obvious recurrence of cancer. There were 2 tiny omental nodules that were smooth and well marginated and not obvious metastases.  CA 125 on 08/16/19 was normal at 2.8.   She is actively losing weight through the bariatric program.  She is depressed after learning that her husband passed on the herpes virus to her.   She has no symptoms concerning for recurrence.  Current Meds:  Outpatient Encounter Medications as of 06/15/2020  Medication Sig  . acetaminophen (TYLENOL) 500 MG tablet Take 1,000 mg by mouth every 6 (six) hours as needed for moderate pain.  Marland Kitchen albuterol (PROVENTIL) (2.5 MG/3ML) 0.083% nebulizer solution Take 3 mLs (2.5 mg total) by nebulization every 4 (four) hours as needed for wheezing or shortness of breath.  Marland Kitchen albuterol (VENTOLIN HFA) 108 (90 Base) MCG/ACT inhaler Inhale 2 puffs into the lungs every 6 (six) hours as needed for wheezing or shortness of breath. Please instruct patient in usage.  Marland Kitchen amLODipine (NORVASC) 5 MG tablet Take 5 mg by mouth daily.  . B-D UF III MINI PEN NEEDLES 31G X 5 MM MISC   . Cholecalciferol (VITAMIN D) 50 MCG (2000 UT) CAPS Take 4,000 Units by mouth daily.  . diclofenac Sodium (VOLTAREN) 1 % GEL Apply to knee once daily as needed  . Insulin Pen Needle 31G X 5 MM MISC Use as directed with Victoza  . Liraglutide (VICTOZA [place]) Inject 12 mg into the skin daily.  Marland Kitchen omeprazole (PRILOSEC) 40 MG capsule Take 1 capsule (40 mg total) by mouth daily.  . TOVIAZ 8 MG TB24 tablet TAKE 1 TABLET(8 MG) BY MOUTH DAILY  . triamcinolone (KENALOG) 0.1 % APPLY TO THE AFFECTED AREA TWICE DAILY AS NEEDED FOR SKIN IRRITATION  . XARELTO 20 MG TABS tablet TAKE 1 TABLET(20 MG) BY MOUTH EVERY MORNING  . [DISCONTINUED] VICTOZA 18 MG/3ML SOPN 12 mg daily.  . cetirizine (ZYRTEC) 10 MG tablet Take 1 tablet (10 mg total) by mouth daily as needed for allergies (itching). (Patient not taking: Reported on 06/14/2020)  . furosemide (LASIX) 20 MG tablet Take 20  mg by mouth as needed. (Patient not taking: Reported on 06/14/2020)  . gabapentin (NEURONTIN) 100 MG capsule Take 1 capsule (100 mg total) by mouth daily as needed (neuropathy). (Patient not taking: Reported on 06/14/2020)  . PEG-KCl-NaCl-NaSulf-Na Asc-C (PLENVU) 140 g SOLR Take as directed per colonoscopy instructions. (Patient not taking: Reported on 06/14/2020)  . valACYclovir (VALTREX) 500 MG tablet Take 1 tablet (500 mg total) by mouth 2 (two) times daily. For 3 days at start of outbreak (Patient not taking: Reported on 06/14/2020)  . [DISCONTINUED] amLODipine (NORVASC) 5 MG tablet Take 1 tablet (5 mg total) by mouth daily.  . [DISCONTINUED] Vitamin D, Ergocalciferol, (DRISDOL) 1.25 MG (50000 UNIT) CAPS capsule Take 50,000 Units by mouth once a week. (Patient not taking: Reported on 06/14/2020)   Facility-Administered Encounter Medications as of 06/15/2020  Medication  . sodium chloride flush (NS) 0.9 % injection 10 mL  . sodium chloride flush (NS) 0.9 % injection 10 mL    Allergy:  Allergies  Allergen Reactions  . Penicillins Rash    Has patient had a PCN reaction causing immediate rash, facial/tongue/throat swelling, SOB or lightheadedness with hypotension: no Has patient had a PCN reaction causing severe rash involving mucus membranes or skin necrosis: no Has patient had a PCN reaction that required hospitalization in the hospital at the time Has patient had a PCN reaction occurring within the last 10 years: no If all of the above answers are "NO", then may proceed with Cephalosporin use.     Social Hx:   Social History   Socioeconomic History  . Marital status: Legally Separated    Spouse name: Not on file  . Number of children: Not on file  . Years of education: Not on file  . Highest education level: Not on file  Occupational History  . Not on file  Tobacco Use  . Smoking status: Never Smoker  . Smokeless tobacco: Never Used  Vaping Use  . Vaping Use: Never used   Substance and Sexual Activity  . Alcohol use: Yes    Comment: Maybe wine every 6 months  . Drug use: No  . Sexual activity: Yes    Partners: Male    Comment: Told she could not have more children in 1986  Other Topics Concern  . 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   Married x 23 years, recently separated (4/15).  No domestic violence.  + financial stress.     Lives previously with husband who moved out, now with daughter who has medical problems, including Hodgkin's disease, and granddaughter (age 74).     Uses seat belt.    Social Determinants of Health   Financial Resource Strain: Not on file  Food Insecurity: Not on file  Transportation Needs: Not on file  Physical Activity: Not on file  Stress: Not on file  Social Connections: Not on file  Intimate Partner Violence: Not on file    Past Surgical Hx:  Past Surgical History:  Procedure Laterality Date  . ABDOMINAL HYSTERECTOMY N/A 03/13/2015   Procedure: TOTAL HYSTERECTOMY ABDOMINAL BILATERAL SALPINGO OOPHORECTOMY RADICAL TUMOR [place];  Surgeon: Everitt Amber, MD;  Location: [facility];  Service: Gynecology;  Laterality: N/A;  . BOWEL RESECTION  03/13/2015   Procedure: SMALL BOWEL RESECTION;  Surgeon: Everitt Amber, MD;  Location: [facility];  Service: Gynecology;;  . [place] and 1984  . COLOSTOMY  2008  . COLOSTOMY TAKEDOWN    . ESOPHAGOGASTRODUODENOSCOPY (EGD) WITH PROPOFOL N/A 12/21/2018   Procedure: ESOPHAGOGASTRODUODENOSCOPY (EGD) WITH PROPOFOL;  Surgeon: Doran Stabler, MD;  Location: [facility];  Service: Gastroenterology;  Laterality: N/A;  . LAPAROTOMY N/A 03/13/2015   Procedure: EXPLORATORY LAPAROTOMY;  Surgeon: Everitt Amber, MD;  Location: [facility];  Service: Gynecology;  Laterality: N/A;  . TONSILLECTOMY AND ADENOIDECTOMY  1997  . VENTRAL HERNIA REPAIR  03/13/2015   Procedure: HERNIA  REPAIR VENTRAL ADULT;  Surgeon: Everitt Amber, MD;  Location: [facility];  Service: Gynecology;;    Past Medical Hx:  Past Medical History:  Diagnosis Date  . Allergy   . Anemia   . Anxiety   . Arthritis   . Blood transfusion without reported diagnosis   . Cough   . Cough 06/11/2018  . Difficulty sleeping 06/11/2018  . Diverticulitis with perforation 2010  . DVT (deep vein thrombosis) in pregnancy 11/24/2016  .  DVT, lower extremity ([place])   . Endometrial cancer ([place])   . Enlarged lymph nodes 08/13/2017  . Family history of adverse reaction to anesthesia    pts daughter had difficulty awakening following anesthesia, long time to wake up  . GERD (gastroesophageal reflux disease)   . Headache   . Hemoptysis 04/22/2018  . History of bronchitis   . History of ear infections   . Hospital discharge follow-up 03/02/2019  . Hx of migraines   . Hyperlipidemia   . Hypertension   . IBS (irritable bowel syndrome)   . Kidney infection   . Lupus (systemic lupus erythematosus) ([place]) 02/2017  . Morbid obesity ([place])   . Neck pain 01/13/2019  . Ovarian cancer ([place]) dx'd 01/2015  . PONV (postoperative nausea and vomiting)   . Portacath in place 09/12/2015  . Pyelonephritis   . Right knee pain 11/28/2011  . Scleroderma ([place])   . Screen for colon cancer 03/02/2019  . Slow rate of speech 08/22/2015   Chronic from CVA.  She also has loss of left nasolabial fold and slight left facial droop/small asymmetry on the left side secondary to CVA  . UTI (urinary tract infection) 05/07/2018    Past Gynecological History:  C/s x 2  Patient's last menstrual period was 10/26/2013.  Family Hx:  Family History  Problem Relation Age of Onset  . Diabetes Mother   . Hypertension Mother   . Hyperlipidemia Mother   . Colon polyps Mother        4 total  . Fibroids Mother        s/p hysterectomy  . Hyperlipidemia Father   . Diabetes Daughter   . Hodgkin's lymphoma Daughter 19  . Asthma Maternal Aunt        severe - d.  59  . Prostate cancer Maternal Uncle 63  . Diabetes Paternal Aunt   . Diabetes Paternal Uncle   . Kidney failure Maternal Grandmother 84  . Pancreatic cancer Paternal Grandmother        dx. >50  . Diabetes Paternal Grandfather   . Diabetes Paternal Aunt   . Heart disease Neg Hx   . Stroke Neg Hx   . Colon cancer Neg Hx   . Stomach cancer Neg Hx   . Esophageal cancer Neg Hx   . Rectal cancer Neg Hx     Review of Systems:  Constitutional  Feels well,    ENT Normal appearing ears and nares bilaterally Skin/Breast  + lupus rash on face Cardiovascular  No chest pain, shortness of breath, or edema  Pulmonary  No cough or wheeze.  Gastro Intestinal  No nausea, vomitting, or diarrhoea. No bright red blood per rectum, nochange in bowel movement, or constipation.  Genito Urinary  No frequency, urgency, dysuria,  Musculo Skeletal  + bilateral knee pain with ambulation, + shoulder pain Neurologic  No weakness, numbness, change in gait,  Psychology  No depression, anxiety, insomnia.   Vitals:  Blood pressure 131/80, pulse 84, temperature (!) 96.9 F (36.1 C), temperature source Tympanic, resp. rate 16, height 5' 5.5" (1.664 m), weight (!) 365 lb (165.6 kg), last menstrual period 10/26/2013, SpO2 100 %.  Physical Exam: WD in NAD Neck  Supple NROM, without any enlargements.  Lymph Node Survey No cervical supraclavicular or inguinal adenopathy Cardiovascular  Pulse normal rate, regularity and rhythm. S1 and S2 normal.  Lungs  Clear to auscultation bilateraly, without wheezes/crackles/rhonchi. Good air movement.  Skin  + facial rash consistent with lupus Psychiatry  Alert and oriented to person, place, and time  Abdomen  Normoactive bowel sounds, abdomen soft, non-tender and obese without palpable evidence of hernia. There is a large midline incision which is healed. Back No CVA tenderness Genito Urinary  Vulva/vagina: Normal external female genitalia.  No lesions. No  discharge or bleeding.  Bladder/urethra:  No lesions or masses, well supported bladder  Vagina: cuff in tact.  Cervix and uterus: surgically absent  Adnexa: no palpable masses. Rectal  Good tone, no masses no cul de sac nodularity.  Extremities  No bilateral cyanosis, clubbing or edema.   Thereasa Solo, MD  06/15/2020, 3:07 PM

## 2020-06-15 ENCOUNTER — Encounter: Payer: Self-pay | Admitting: Gynecologic Oncology

## 2020-06-15 ENCOUNTER — Inpatient Hospital Stay: Payer: Medicare HMO | Attending: Gynecologic Oncology | Admitting: Gynecologic Oncology

## 2020-06-15 ENCOUNTER — Inpatient Hospital Stay: Payer: Medicare HMO

## 2020-06-15 ENCOUNTER — Other Ambulatory Visit: Payer: Self-pay

## 2020-06-15 VITALS — BP 131/80 | HR 84 | Temp 96.9°F | Resp 16 | Ht 65.5 in | Wt 365.0 lb

## 2020-06-15 DIAGNOSIS — E785 Hyperlipidemia, unspecified: Secondary | ICD-10-CM | POA: Insufficient documentation

## 2020-06-15 DIAGNOSIS — Z79899 Other long term (current) drug therapy: Secondary | ICD-10-CM | POA: Insufficient documentation

## 2020-06-15 DIAGNOSIS — E119 Type 2 diabetes mellitus without complications: Secondary | ICD-10-CM | POA: Diagnosis not present

## 2020-06-15 DIAGNOSIS — Z86718 Personal history of other venous thrombosis and embolism: Secondary | ICD-10-CM | POA: Diagnosis not present

## 2020-06-15 DIAGNOSIS — Z90722 Acquired absence of ovaries, bilateral: Secondary | ICD-10-CM | POA: Insufficient documentation

## 2020-06-15 DIAGNOSIS — Z90710 Acquired absence of both cervix and uterus: Secondary | ICD-10-CM | POA: Insufficient documentation

## 2020-06-15 DIAGNOSIS — Z86711 Personal history of pulmonary embolism: Secondary | ICD-10-CM | POA: Insufficient documentation

## 2020-06-15 DIAGNOSIS — Z7901 Long term (current) use of anticoagulants: Secondary | ICD-10-CM | POA: Diagnosis not present

## 2020-06-15 DIAGNOSIS — K219 Gastro-esophageal reflux disease without esophagitis: Secondary | ICD-10-CM | POA: Diagnosis not present

## 2020-06-15 DIAGNOSIS — C569 Malignant neoplasm of unspecified ovary: Secondary | ICD-10-CM

## 2020-06-15 DIAGNOSIS — C561 Malignant neoplasm of right ovary: Secondary | ICD-10-CM | POA: Insufficient documentation

## 2020-06-15 DIAGNOSIS — I10 Essential (primary) hypertension: Secondary | ICD-10-CM | POA: Insufficient documentation

## 2020-06-15 DIAGNOSIS — C541 Malignant neoplasm of endometrium: Secondary | ICD-10-CM | POA: Insufficient documentation

## 2020-06-15 DIAGNOSIS — B009 Herpesviral infection, unspecified: Secondary | ICD-10-CM | POA: Insufficient documentation

## 2020-06-15 NOTE — Telephone Encounter (Signed)
Attempted to call patient.  Phone line is busy.  I will continue to try and reach the patient.    Dr.Danis,  See notes from Dr. [name]

## 2020-06-15 NOTE — Patient Instructions (Signed)
As it has been 5 years since finishing your cancer treatment, you only need to follow-up with Dr Denman George once per year unless you develop new symptoms that are concerning for cancer.

## 2020-06-16 LAB — CA 125: Cancer Antigen (CA) 125: 2.7 U/mL (ref 0.0–38.1)

## 2020-06-18 ENCOUNTER — Telehealth: Payer: Self-pay | Admitting: *Deleted

## 2020-06-18 NOTE — Telephone Encounter (Signed)
Patient notified of the recommendations and she takes her Xarelto in the am. She is notified last dose will be 07/17/20.  She verbalized understanding.

## 2020-06-18 NOTE — Telephone Encounter (Signed)
Thanks for communicating with Dr. [name] about the [facility].  If [name] takes the Xarelto in the morning, then last dose two mornings before the procedure.(about 48 hours)  If takes in the evening, then last dose two evenings before the procedure.(about 36 hours)  - HD

## 2020-06-18 NOTE — Telephone Encounter (Signed)
PC to patient, informed of CA 125 results from 06/15/20 - 2.7    Patient verbalizes understanding, no questions or concerns.

## 2020-06-29 ENCOUNTER — Telehealth: Payer: Self-pay | Admitting: Gastroenterology

## 2020-06-29 NOTE — Telephone Encounter (Signed)
Inbound call from patient stating Plenvu is not covered by her insurance and is requesting alternate prep be sent to the pharmacy please.

## 2020-06-29 NOTE — Telephone Encounter (Signed)
Mailed a medicare coupon to the patients home address on file

## 2020-07-06 ENCOUNTER — Other Ambulatory Visit: Payer: Self-pay

## 2020-07-06 ENCOUNTER — Telehealth: Payer: Self-pay | Admitting: Gastroenterology

## 2020-07-06 MED ORDER — OMEPRAZOLE 40 MG PO CPDR: 40.0000 mg | DELAYED_RELEASE_CAPSULE | Freq: Every day | ORAL | 2 refills | Status: DC

## 2020-07-09 NOTE — Telephone Encounter (Signed)
Refills have been given

## 2020-07-10 ENCOUNTER — Telehealth: Payer: Self-pay | Admitting: Gastroenterology

## 2020-07-10 NOTE — Telephone Encounter (Signed)
Patient is aware to come to the office for a free sample kit.

## 2020-07-10 NOTE — Telephone Encounter (Signed)
Patient called states the Plenvu prep medication given is too expensive and she did not get the coupon.

## 2020-07-11 NOTE — Progress Notes (Signed)
Cardiology Office Note:    Date:  07/12/2020   ID:  [name], DOB 01/15/1966, MRN [id number]  PCP:  Leeanne Rio, MD  Cardiologist:  Elouise Munroe, MD  Electrophysiologist:  None   Referring MD: Leeanne Rio, MD   Chief Complaint/Reason for Referral: HTN  History of Present Illness:    [name] is a 55 y.o. female with a history of morbid obesity, endometrial and ovarian cancer, hypertension, hyperlipidemia, DVT/PE who presents today for follow-up.   BP is well controlled on amlopdine 5 mg a day.   Awaiting colonoscopy - some GI symptoms. Throwing up after eating "ruffage".    Past Medical History:  Diagnosis Date  . Allergy   . Anemia   . Anxiety   . Arthritis   . Blood transfusion without reported diagnosis   . Cough   . Cough 06/11/2018  . Difficulty sleeping 06/11/2018  . Diverticulitis with perforation 2010  . DVT (deep vein thrombosis) in pregnancy 11/24/2016  . DVT, lower extremity ([place])   . Endometrial cancer ([place])   . Enlarged lymph nodes 08/13/2017  . Family history of adverse reaction to anesthesia    pts daughter had difficulty awakening following anesthesia, long time to wake up  . GERD (gastroesophageal reflux disease)   . Headache   . Hemoptysis 04/22/2018  . History of bronchitis   . History of ear infections   . Hospital discharge follow-up 03/02/2019  . Hx of migraines   . Hyperlipidemia   . Hypertension   . IBS (irritable bowel syndrome)   . Kidney infection   . Lupus (systemic lupus erythematosus) ([place]) 02/2017  . Morbid obesity ([place])   . Neck pain 01/13/2019  . Ovarian cancer ([place]) dx'd 01/2015  . PONV (postoperative nausea and vomiting)   . Portacath in place 09/12/2015  . Pre-diabetes   . Pyelonephritis   . Right knee pain 11/28/2011  . Scleroderma ([place])   . Screen for colon cancer 03/02/2019  . Slow rate of speech 08/22/2015   Chronic from CVA.  She also has loss of left nasolabial fold and slight left  facial droop/small asymmetry on the left side secondary to CVA  . UTI (urinary tract infection) 05/07/2018    Past Surgical History:  Procedure Laterality Date  . ABDOMINAL HYSTERECTOMY N/A 03/13/2015   Procedure: TOTAL HYSTERECTOMY ABDOMINAL BILATERAL SALPINGO OOPHORECTOMY RADICAL TUMOR [place];  Surgeon: Everitt Amber, MD;  Location: [facility];  Service: Gynecology;  Laterality: N/A;  . BOWEL RESECTION  03/13/2015   Procedure: SMALL BOWEL RESECTION;  Surgeon: Everitt Amber, MD;  Location: [facility];  Service: Gynecology;;  . [place] and 1984  . COLOSTOMY  2008  . COLOSTOMY TAKEDOWN    . ESOPHAGOGASTRODUODENOSCOPY (EGD) WITH PROPOFOL N/A 12/21/2018   Procedure: ESOPHAGOGASTRODUODENOSCOPY (EGD) WITH PROPOFOL;  Surgeon: Doran Stabler, MD;  Location: [facility];  Service: Gastroenterology;  Laterality: N/A;  . LAPAROTOMY N/A 03/13/2015   Procedure: EXPLORATORY LAPAROTOMY;  Surgeon: Everitt Amber, MD;  Location: [facility];  Service: Gynecology;  Laterality: N/A;  . TONSILLECTOMY AND ADENOIDECTOMY  1997  . VENTRAL HERNIA REPAIR  03/13/2015   Procedure: HERNIA REPAIR VENTRAL ADULT;  Surgeon: Everitt Amber, MD;  Location: [facility];  Service: Gynecology;;    Current Medications: Current Meds  Medication Sig  . acetaminophen (TYLENOL) 500 MG tablet Take 1,000 mg by mouth every 6 (six) hours as needed for moderate pain.  Marland Kitchen albuterol (PROVENTIL) (2.5 MG/3ML) 0.083% nebulizer solution  Take 3 mLs (2.5 mg total) by nebulization every 4 (four) hours as needed for wheezing or shortness of breath.  Marland Kitchen albuterol (VENTOLIN HFA) 108 (90 Base) MCG/ACT inhaler Inhale 2 puffs into the lungs every 6 (six) hours as needed for wheezing or shortness of breath. Please instruct patient in usage.  Marland Kitchen amLODipine (NORVASC) 5 MG tablet Take 5 mg by mouth daily.  . B-D UF III MINI PEN NEEDLES 31G X 5 MM MISC   . cetirizine (ZYRTEC) 10 MG tablet Take 1 tablet (10 mg total) by mouth daily as needed for allergies (itching).  .  Cholecalciferol (VITAMIN D) 50 MCG (2000 UT) CAPS Take 4,000 Units by mouth daily.  . diclofenac Sodium (VOLTAREN) 1 % GEL Apply to knee once daily as needed  . furosemide (LASIX) 20 MG tablet Take 20 mg by mouth as needed.  . gabapentin (NEURONTIN) 100 MG capsule Take 1 capsule (100 mg total) by mouth daily as needed (neuropathy).  . Insulin Pen Needle 31G X 5 MM MISC Use as directed with Victoza  . Liraglutide (VICTOZA [place]) Inject 12 mg into the skin daily.  Marland Kitchen omeprazole (PRILOSEC) 40 MG capsule Take 1 capsule (40 mg total) by mouth daily.  Marland Kitchen PEG-KCl-NaCl-NaSulf-Na Asc-C (PLENVU) 140 g SOLR Take as directed per colonoscopy instructions.  . TOVIAZ 8 MG TB24 tablet TAKE 1 TABLET(8 MG) BY MOUTH DAILY  . triamcinolone (KENALOG) 0.1 % APPLY TO THE AFFECTED AREA TWICE DAILY AS NEEDED FOR SKIN IRRITATION  . valACYclovir (VALTREX) 500 MG tablet Take 1 tablet (500 mg total) by mouth 2 (two) times daily. For 3 days at start of outbreak  . XARELTO 20 MG TABS tablet TAKE 1 TABLET(20 MG) BY MOUTH EVERY MORNING     Allergies:   Penicillins   Social History   Tobacco Use  . Smoking status: Never Smoker  . Smokeless tobacco: Never Used  Vaping Use  . Vaping Use: Never used  Substance Use Topics  . Alcohol use: Yes    Comment: Maybe wine every 6 months  . Drug use: No     Family History: The patient's family history includes Asthma in her maternal aunt; Colon polyps in her mother; Diabetes in her daughter, mother, paternal aunt, paternal aunt, paternal grandfather, and paternal uncle; Fibroids in her mother; Hodgkin's lymphoma (age of onset: 39) in her daughter; Hyperlipidemia in her father and mother; Hypertension in her mother; Kidney failure (age of onset: 56) in her maternal grandmother; Pancreatic cancer in her paternal grandmother; Prostate cancer (age of onset: 58) in her maternal uncle. There is no history of Heart disease, Stroke, Colon cancer, Stomach cancer, Esophageal cancer, or Rectal  cancer.  ROS:   Please see the history of present illness.    All other systems reviewed and are negative.  EKGs/Labs/Other Studies Reviewed:    The following studies were reviewed today:  EKG:  NSR, poor R wave progression, no change from prior.    Recent Labs: 07/20/2019: BUN 22; Creatinine, Ser 1.20; Potassium 4.5; Sodium 141  Recent Lipid Panel    Component Value Date/Time   CHOL 241 (H) 01/01/2018 1010   TRIG 64 02/19/2019 1111   HDL 54 01/01/2018 1010   CHOLHDL 4.5 01/01/2018 1010   VLDL 19 01/01/2018 1010   LDLCALC 168 (H) 01/01/2018 1010   LDLDIRECT 164 (H) 07/26/2013 0933    Physical Exam:    VS:  BP 128/76 (BP Location: Left Wrist, Patient Position: Sitting, Cuff Size: Normal)   Pulse 75  Ht 5' 5.5" (1.664 m)   Wt (!) 375 lb (170.1 kg)   LMP 10/26/2013   BMI 61.45 kg/m     Wt Readings from Last 5 Encounters:  07/12/20 (!) 375 lb (170.1 kg)  06/15/20 (!) 365 lb (165.6 kg)  06/06/20 (!) 369 lb (167.4 kg)  03/15/20 (!) 365 lb (165.6 kg)  01/31/20 (!) 362 lb (164.2 kg)    Constitutional: No acute distress Eyes: sclera non-icteric, normal conjunctiva and lids ENMT: normal dentition, moist mucous membranes Cardiovascular: regular rhythm, normal rate, no murmurs. S1 and S2 normal. Radial pulses normal bilaterally. No jugular venous distention.  Respiratory: clear to auscultation bilaterally GI : normal bowel sounds, soft and nontender. No distention.   MSK: extremities warm, well perfused. No edema.  NEURO: grossly nonfocal exam, moves all extremities. PSYCH: alert and oriented x 3, normal mood and affect.   ASSESSMENT:    1. Hyperlipidemia, unspecified hyperlipidemia type   2. Essential hypertension, benign   3. OSA (obstructive sleep apnea)   4. History of pulmonary embolism   5. Long term current use of anticoagulant therapy   6. Morbid obesity ([place])    PLAN:    Hyperlipidemia, unspecified hyperlipidemia type - Plan: Lipid panel -will return for  lipid panel since we do not have lipids since 2019. Not currently on lipid lowering therapy, has diabetes  Essential hypertension, benign - BP well controlled on amlodipine and prn lasix.  History of pulmonary embolism Long term current use of anticoagulant therapy - continues on Xarelto, no bleeding  Morbid obesity ([place])  - continues to pursue weight loss, surgery on hold at the moment due to COVID 19 pandemic.  Total time of encounter: 31 minutes total time of encounter, including 20 minutes spent in face-to-face patient care on the date of this encounter. This time includes coordination of care and counseling regarding above mentioned problem list. Remainder of non-face-to-face time involved reviewing chart documents/testing relevant to the patient encounter and documentation in the medical record. I have independently reviewed documentation from referring provider.   Cherlynn Kaiser, MD, [facility]    Medication Adjustments/Labs and Tests Ordered: Current medicines are reviewed at length with the patient today.  Concerns regarding medicines are outlined above.   Orders Placed This Encounter  Procedures  . Lipid panel     No orders of the defined types were placed in this encounter.   Patient Instructions  Medication Instructions:  No Changes In Medications at this time.  *If you need a refill on your cardiac medications before your next appointment, please call your pharmacy*  Lab Work: LIPID PANEL- PLEASE RETURN FOR THIS- YOU WILL NEED TO BE FASTING If you have labs (blood work) drawn today and your tests are completely normal, you will receive your results only by: Marland Kitchen MyChart Message (if you have MyChart) OR . A paper copy in the mail If you have any lab test that is abnormal or we need to change your treatment, we will call you to review the results.  Follow-Up: At [facility], you and your health needs are our priority.  As part of our  continuing mission to provide you with exceptional heart care, we have created designated Provider Care Teams.  These Care Teams include your primary Cardiologist (physician) and Advanced Practice Providers (APPs -  Physician Assistants and Nurse Practitioners) who all work together to provide you with the care you need, when you need it.  Your next appointment:  6 month(s)  The format for your next appointment:   In Person  Provider:   Cherlynn Kaiser, MD

## 2020-07-12 ENCOUNTER — Encounter (HOSPITAL_COMMUNITY): Payer: Self-pay | Admitting: Gastroenterology

## 2020-07-12 ENCOUNTER — Ambulatory Visit (INDEPENDENT_AMBULATORY_CARE_PROVIDER_SITE_OTHER): Payer: Medicare HMO | Admitting: Internal Medicine

## 2020-07-12 ENCOUNTER — Encounter: Payer: Self-pay | Admitting: Internal Medicine

## 2020-07-12 ENCOUNTER — Other Ambulatory Visit: Payer: Self-pay

## 2020-07-12 VITALS — BP 128/76 | HR 75 | Ht 65.5 in | Wt 375.0 lb

## 2020-07-12 DIAGNOSIS — I10 Essential (primary) hypertension: Secondary | ICD-10-CM | POA: Diagnosis not present

## 2020-07-12 DIAGNOSIS — Z86711 Personal history of pulmonary embolism: Secondary | ICD-10-CM

## 2020-07-12 DIAGNOSIS — Z7901 Long term (current) use of anticoagulants: Secondary | ICD-10-CM

## 2020-07-12 DIAGNOSIS — E785 Hyperlipidemia, unspecified: Secondary | ICD-10-CM

## 2020-07-12 DIAGNOSIS — G4733 Obstructive sleep apnea (adult) (pediatric): Secondary | ICD-10-CM | POA: Diagnosis not present

## 2020-07-12 NOTE — Patient Instructions (Signed)
Medication Instructions:  No Changes In Medications at this time.  *If you need a refill on your cardiac medications before your next appointment, please call your pharmacy*  Lab Work: LIPID PANEL- PLEASE RETURN FOR THIS- YOU WILL NEED TO BE FASTING If you have labs (blood work) drawn today and your tests are completely normal, you will receive your results only by: Marland Kitchen MyChart Message (if you have MyChart) OR . A paper copy in the mail If you have any lab test that is abnormal or we need to change your treatment, we will call you to review the results.  Follow-Up: At [facility], you and your health needs are our priority.  As part of our continuing mission to provide you with exceptional heart care, we have created designated Provider Care Teams.  These Care Teams include your primary Cardiologist (physician) and Advanced Practice Providers (APPs -  Physician Assistants and Nurse Practitioners) who all work together to provide you with the care you need, when you need it.  Your next appointment:   6 month(s)  The format for your next appointment:   In Person  Provider:   Cherlynn Kaiser, MD

## 2020-07-13 ENCOUNTER — Other Ambulatory Visit: Payer: Self-pay

## 2020-07-13 ENCOUNTER — Encounter (HOSPITAL_COMMUNITY): Payer: Self-pay | Admitting: Gastroenterology

## 2020-07-13 NOTE — Progress Notes (Signed)
Attempted to obtain medical history via telephone, unable to reach at this time. I left a voicemail to return pre surgical testing department's phone call.  

## 2020-07-13 NOTE — Addendum Note (Signed)
Addended by: Venetia Maxon on: 07/13/2020 07:52 AM   Modules accepted: Orders

## 2020-07-16 ENCOUNTER — Other Ambulatory Visit (HOSPITAL_COMMUNITY)
Admission: RE | Admit: 2020-07-16 | Discharge: 2020-07-16 | Disposition: A | Discharge status: Home / Self Care | Payer: Medicare HMO | Source: Ambulatory Visit | Attending: Gastroenterology | Admitting: Gastroenterology

## 2020-07-16 ENCOUNTER — Telehealth: Payer: Self-pay | Admitting: Family Medicine

## 2020-07-16 DIAGNOSIS — Z01812 Encounter for preprocedural laboratory examination: Secondary | ICD-10-CM | POA: Insufficient documentation

## 2020-07-16 DIAGNOSIS — Z20822 Contact with and (suspected) exposure to covid-19: Secondary | ICD-10-CM | POA: Insufficient documentation

## 2020-07-16 DIAGNOSIS — A419 Sepsis, unspecified organism: Secondary | ICD-10-CM | POA: Diagnosis not present

## 2020-07-16 LAB — SARS CORONAVIRUS 2 (TAT 6-24 HRS): SARS Coronavirus 2: NEGATIVE

## 2020-07-16 MED ORDER — ENOXAPARIN SODIUM 100 MG/ML ~~LOC~~ SOLN: 165.0000 mg | Freq: Two times a day (BID) | SUBCUTANEOUS | 0 refills | Status: DC

## 2020-07-16 NOTE — Telephone Encounter (Signed)
Called patient about her anticoagulation prior to colonoscopy this week. Her last dose of xarelto will be tonight. I will send in lovenox injections for her - she'll do this twice daily for the next 2 days, with the last dose on Wednesday evening. This was the regimen recommended by Dr. Loletha Carrow in his note to me, and I think it does a good job of balancing maintaining her preprocedural anticoagulation with minimizing procedural risk.  Will dose lovenox at 1mg /kg/dose twice daily for 4 doses. Spoke with pharmacist at her pharmacy who confirmed I should rx the 100mg /ml syringes because that is the highest prefilled syringe they have in stock. Rx sent in.  Patient agreeable and appreciative.  Leeanne Rio, MD

## 2020-07-18 NOTE — Anesthesia Preprocedure Evaluation (Addendum)
Anesthesia Evaluation  Patient identified by MRN, date of birth, ID band Patient awake    Reviewed: Allergy & Precautions, NPO status , Patient's Chart, lab work & pertinent test results  History of Anesthesia Complications (+) PONV, Family history of anesthesia reaction and history of anesthetic complications  Airway Mallampati: I       Dental no notable dental hx.    Pulmonary shortness of breath,    Pulmonary exam normal        Cardiovascular hypertension, + DVT  Normal cardiovascular exam  HLD  TTE 2018 - Normal LV systolic function; mild diastolic dysfunction.   Neuro/Psych  Headaches, PSYCHIATRIC DISORDERS Anxiety CVA, Residual Symptoms    GI/Hepatic Neg liver ROS, GERD  Medicated,  Endo/Other  Morbid obesityLupus  Renal/GU   negative genitourinary   Musculoskeletal  (+) Arthritis ,   Abdominal (+) + obese,   Peds  Hematology   Anesthesia Other Findings   Reproductive/Obstetrics                            Anesthesia Physical  Anesthesia Plan  ASA: III  Anesthesia Plan: MAC   Post-op Pain Management:    Induction: Intravenous  PONV Risk Score and Plan: Propofol infusion and Treatment may vary due to age or medical condition  Airway Management Planned: Natural Airway, Nasal Cannula and Simple Face Mask  Additional Equipment: None  Intra-op Plan:   Post-operative Plan:   Informed Consent: I have reviewed the patients History and Physical, chart, labs and discussed the procedure including the risks, benefits and alternatives for the proposed anesthesia with the patient or authorized representative who has indicated his/her understanding and acceptance.       Plan Discussed with: CRNA  Anesthesia Plan Comments:        Anesthesia Quick Evaluation

## 2020-07-19 ENCOUNTER — Ambulatory Visit (HOSPITAL_BASED_OUTPATIENT_CLINIC_OR_DEPARTMENT_OTHER)
Admission: RE | Admit: 2020-07-19 | Discharge: 2020-07-19 | Disposition: A | Discharge status: Home / Self Care | Payer: Medicare HMO | Source: Home / Self Care | Attending: Gastroenterology | Admitting: Gastroenterology

## 2020-07-19 ENCOUNTER — Inpatient Hospital Stay (HOSPITAL_COMMUNITY)
Admission: EM | Admit: 2020-07-19 | Discharge: 2020-07-21 | DRG: 871 | Disposition: A | Discharge status: Home / Self Care | Payer: Medicare HMO | Attending: Family Medicine | Admitting: Family Medicine

## 2020-07-19 ENCOUNTER — Emergency Department (HOSPITAL_COMMUNITY): Payer: Medicare HMO

## 2020-07-19 ENCOUNTER — Encounter (HOSPITAL_COMMUNITY): Payer: Self-pay

## 2020-07-19 ENCOUNTER — Ambulatory Visit (HOSPITAL_COMMUNITY): Payer: Medicare HMO | Admitting: Certified Registered Nurse Anesthetist

## 2020-07-19 ENCOUNTER — Encounter (HOSPITAL_COMMUNITY): Payer: Self-pay | Admitting: Gastroenterology

## 2020-07-19 ENCOUNTER — Other Ambulatory Visit: Payer: Self-pay

## 2020-07-19 ENCOUNTER — Telehealth: Payer: Self-pay | Admitting: Gastroenterology

## 2020-07-19 ENCOUNTER — Encounter (HOSPITAL_COMMUNITY)
Admission: RE | Disposition: A | Discharge status: Home / Self Care | Payer: Self-pay | Source: Home / Self Care | Attending: Gastroenterology

## 2020-07-19 DIAGNOSIS — I5032 Chronic diastolic (congestive) heart failure: Secondary | ICD-10-CM | POA: Diagnosis present

## 2020-07-19 DIAGNOSIS — E119 Type 2 diabetes mellitus without complications: Secondary | ICD-10-CM | POA: Diagnosis present

## 2020-07-19 DIAGNOSIS — I132 Hypertensive heart and chronic kidney disease with heart failure and with stage 5 chronic kidney disease, or end stage renal disease: Secondary | ICD-10-CM | POA: Diagnosis present

## 2020-07-19 DIAGNOSIS — R042 Hemoptysis: Secondary | ICD-10-CM | POA: Diagnosis present

## 2020-07-19 DIAGNOSIS — D123 Benign neoplasm of transverse colon: Secondary | ICD-10-CM | POA: Diagnosis not present

## 2020-07-19 DIAGNOSIS — Z20822 Contact with and (suspected) exposure to covid-19: Secondary | ICD-10-CM | POA: Diagnosis present

## 2020-07-19 DIAGNOSIS — K219 Gastro-esophageal reflux disease without esophagitis: Secondary | ICD-10-CM | POA: Diagnosis present

## 2020-07-19 DIAGNOSIS — Z800 Family history of malignant neoplasm of digestive organs: Secondary | ICD-10-CM | POA: Diagnosis not present

## 2020-07-19 DIAGNOSIS — A419 Sepsis, unspecified organism: Secondary | ICD-10-CM | POA: Diagnosis present

## 2020-07-19 DIAGNOSIS — J690 Pneumonitis due to inhalation of food and vomit: Secondary | ICD-10-CM | POA: Diagnosis present

## 2020-07-19 DIAGNOSIS — Z86711 Personal history of pulmonary embolism: Secondary | ICD-10-CM | POA: Diagnosis not present

## 2020-07-19 DIAGNOSIS — Z7901 Long term (current) use of anticoagulants: Secondary | ICD-10-CM | POA: Diagnosis not present

## 2020-07-19 DIAGNOSIS — Z833 Family history of diabetes mellitus: Secondary | ICD-10-CM

## 2020-07-19 DIAGNOSIS — K449 Diaphragmatic hernia without obstruction or gangrene: Secondary | ICD-10-CM | POA: Diagnosis present

## 2020-07-19 DIAGNOSIS — Z8542 Personal history of malignant neoplasm of other parts of uterus: Secondary | ICD-10-CM | POA: Diagnosis not present

## 2020-07-19 DIAGNOSIS — R7989 Other specified abnormal findings of blood chemistry: Secondary | ICD-10-CM | POA: Diagnosis present

## 2020-07-19 DIAGNOSIS — Z79899 Other long term (current) drug therapy: Secondary | ICD-10-CM | POA: Diagnosis not present

## 2020-07-19 DIAGNOSIS — K920 Hematemesis: Secondary | ICD-10-CM | POA: Diagnosis present

## 2020-07-19 DIAGNOSIS — J9801 Acute bronchospasm: Secondary | ICD-10-CM | POA: Diagnosis present

## 2020-07-19 DIAGNOSIS — M329 Systemic lupus erythematosus, unspecified: Secondary | ICD-10-CM | POA: Diagnosis present

## 2020-07-19 DIAGNOSIS — Z8543 Personal history of malignant neoplasm of ovary: Secondary | ICD-10-CM

## 2020-07-19 DIAGNOSIS — K635 Polyp of colon: Secondary | ICD-10-CM | POA: Insufficient documentation

## 2020-07-19 DIAGNOSIS — Z807 Family history of other malignant neoplasms of lymphoid, hematopoietic and related tissues: Secondary | ICD-10-CM | POA: Diagnosis not present

## 2020-07-19 DIAGNOSIS — J9601 Acute respiratory failure with hypoxia: Secondary | ICD-10-CM | POA: Diagnosis present

## 2020-07-19 DIAGNOSIS — E785 Hyperlipidemia, unspecified: Secondary | ICD-10-CM | POA: Diagnosis present

## 2020-07-19 DIAGNOSIS — Z90710 Acquired absence of both cervix and uterus: Secondary | ICD-10-CM

## 2020-07-19 DIAGNOSIS — G4733 Obstructive sleep apnea (adult) (pediatric): Secondary | ICD-10-CM | POA: Diagnosis present

## 2020-07-19 DIAGNOSIS — D128 Benign neoplasm of rectum: Secondary | ICD-10-CM | POA: Insufficient documentation

## 2020-07-19 DIAGNOSIS — Z1211 Encounter for screening for malignant neoplasm of colon: Secondary | ICD-10-CM

## 2020-07-19 DIAGNOSIS — I248 Other forms of acute ischemic heart disease: Secondary | ICD-10-CM | POA: Diagnosis present

## 2020-07-19 DIAGNOSIS — R778 Other specified abnormalities of plasma proteins: Secondary | ICD-10-CM | POA: Diagnosis present

## 2020-07-19 DIAGNOSIS — Z90722 Acquired absence of ovaries, bilateral: Secondary | ICD-10-CM

## 2020-07-19 DIAGNOSIS — Z880 Allergy status to penicillin: Secondary | ICD-10-CM | POA: Insufficient documentation

## 2020-07-19 DIAGNOSIS — K621 Rectal polyp: Secondary | ICD-10-CM | POA: Diagnosis present

## 2020-07-19 DIAGNOSIS — Z9221 Personal history of antineoplastic chemotherapy: Secondary | ICD-10-CM

## 2020-07-19 DIAGNOSIS — Z6841 Body Mass Index (BMI) 40.0 and over, adult: Secondary | ICD-10-CM

## 2020-07-19 DIAGNOSIS — C561 Malignant neoplasm of right ovary: Secondary | ICD-10-CM | POA: Diagnosis present

## 2020-07-19 DIAGNOSIS — Z86718 Personal history of other venous thrombosis and embolism: Secondary | ICD-10-CM | POA: Insufficient documentation

## 2020-07-19 DIAGNOSIS — D6489 Other specified anemias: Secondary | ICD-10-CM | POA: Diagnosis present

## 2020-07-19 DIAGNOSIS — J189 Pneumonia, unspecified organism: Secondary | ICD-10-CM | POA: Diagnosis not present

## 2020-07-19 HISTORY — PX: HEMOSTASIS CLIP PLACEMENT: SHX6857

## 2020-07-19 HISTORY — DX: Presence of other vascular implants and grafts: Z95.828

## 2020-07-19 HISTORY — DX: Prediabetes: R73.03

## 2020-07-19 HISTORY — PX: COLONOSCOPY WITH PROPOFOL: SHX5780

## 2020-07-19 HISTORY — PX: POLYPECTOMY: SHX5525

## 2020-07-19 LAB — TROPONIN I (HIGH SENSITIVITY)
Troponin I (High Sensitivity): 14 ng/L (ref <18)
Troponin I (High Sensitivity): 50 ng/L — ABNORMAL HIGH (ref <18)
Troponin I (High Sensitivity): 69 ng/L — ABNORMAL HIGH (ref <18)
Troponin I (High Sensitivity): 65 ng/L — ABNORMAL HIGH (ref <18)

## 2020-07-19 LAB — CBC WITH DIFFERENTIAL/PLATELET
Abs Immature Granulocytes: 0.02 10*3/uL (ref 0.00–0.07)
Basophils Absolute: 0 10*3/uL (ref 0.0–0.1)
Basophils Relative: 0 %
Eosinophils Absolute: 0 10*3/uL (ref 0.0–0.5)
Eosinophils Relative: 0 %
HCT: 35.3 % — ABNORMAL LOW (ref 36.0–46.0)
Hemoglobin: 11.1 g/dL — ABNORMAL LOW (ref 12.0–15.0)
Immature Granulocytes: 0 %
Lymphocytes Relative: 8 %
Lymphs Abs: 0.6 10*3/uL — ABNORMAL LOW (ref 0.7–4.0)
MCH: 28.8 pg (ref 26.0–34.0)
MCHC: 31.4 g/dL (ref 30.0–36.0)
MCV: 91.7 fL (ref 80.0–100.0)
Monocytes Absolute: 0.4 10*3/uL (ref 0.1–1.0)
Monocytes Relative: 5 %
Neutro Abs: 6.9 10*3/uL (ref 1.7–7.7)
Neutrophils Relative %: 87 %
Platelets: 167 10*3/uL (ref 150–400)
RBC: 3.85 MIL/uL — ABNORMAL LOW (ref 3.87–5.11)
RDW: 14.6 % (ref 11.5–15.5)
WBC: 7.9 10*3/uL (ref 4.0–10.5)
nRBC: 0 % (ref 0.0–0.2)

## 2020-07-19 LAB — COMPREHENSIVE METABOLIC PANEL
ALT: 14 U/L (ref 0–44)
AST: 21 U/L (ref 15–41)
Albumin: 3.7 g/dL (ref 3.5–5.0)
Alkaline Phosphatase: 77 U/L (ref 38–126)
Anion gap: 12 (ref 5–15)
BUN: 15 mg/dL (ref 6–20)
CO2: 21 mmol/L — ABNORMAL LOW (ref 22–32)
Calcium: 9.1 mg/dL (ref 8.9–10.3)
Chloride: 114 mmol/L — ABNORMAL HIGH (ref 98–111)
Creatinine, Ser: 0.98 mg/dL (ref 0.44–1.00)
GFR, Estimated: >60 mL/min (ref >60)
Glucose, Bld: 117 mg/dL — ABNORMAL HIGH (ref 70–99)
Potassium: 3.5 mmol/L (ref 3.5–5.1)
Sodium: 147 mmol/L — ABNORMAL HIGH (ref 135–145)
Total Bilirubin: 1 mg/dL (ref 0.3–1.2)
Total Protein: 6.9 g/dL (ref 6.5–8.1)

## 2020-07-19 LAB — LIPASE, BLOOD: Lipase: 32 U/L (ref 11–51)

## 2020-07-19 LAB — RESP PANEL BY RT-PCR (FLU A&B, COVID) ARPGX2
Influenza A by PCR: NEGATIVE
Influenza B by PCR: NEGATIVE
SARS Coronavirus 2 by RT PCR: NEGATIVE

## 2020-07-19 LAB — LACTIC ACID, PLASMA: Lactic Acid, Venous: 1.5 mmol/L (ref 0.5–1.9)

## 2020-07-19 LAB — PROCALCITONIN: Procalcitonin: 6.25 ng/mL

## 2020-07-19 SURGERY — COLONOSCOPY WITH PROPOFOL
Anesthesia: Monitor Anesthesia Care

## 2020-07-19 MED ORDER — PROPOFOL 10 MG/ML IV BOLUS
INTRAVENOUS | Status: DC | PRN
  Administered 2020-07-19 (×7): 20 mg via INTRAVENOUS
  Administered 2020-07-19: 40 mg via INTRAVENOUS
  Administered 2020-07-19: 20 mg via INTRAVENOUS

## 2020-07-19 MED ORDER — IOHEXOL 350 MG/ML SOLN
75.0000 mL | Freq: Once | INTRAVENOUS | Status: AC | PRN
  Administered 2020-07-19: 75 mL via INTRAVENOUS

## 2020-07-19 MED ORDER — HYDROCODONE-ACETAMINOPHEN 5-325 MG PO TABS: 1.0000 | ORAL_TABLET | ORAL | Status: DC | PRN

## 2020-07-19 MED ORDER — DICLOFENAC SODIUM 1 % EX GEL
1.0000 "application " | Freq: Every day | CUTANEOUS | Status: DC | PRN
  Filled 2020-07-19: qty 100

## 2020-07-19 MED ORDER — ACETAMINOPHEN 500 MG PO TABS
1000.0000 mg | ORAL_TABLET | Freq: Once | ORAL | Status: AC
  Administered 2020-07-19: 1000 mg via ORAL
  Filled 2020-07-19: qty 2

## 2020-07-19 MED ORDER — SODIUM CHLORIDE 0.9 % IV SOLN
500.0000 mg | INTRAVENOUS | Status: DC
  Administered 2020-07-20: 500 mg via INTRAVENOUS
  Filled 2020-07-19 (×2): qty 500

## 2020-07-19 MED ORDER — HEPARIN SOD (PORK) LOCK FLUSH 100 UNIT/ML IV SOLN: 500.0000 [IU] | INTRAVENOUS | Status: DC | PRN

## 2020-07-19 MED ORDER — ONDANSETRON HCL 4 MG PO TABS: 4.0000 mg | ORAL_TABLET | Freq: Four times a day (QID) | ORAL | Status: DC | PRN

## 2020-07-19 MED ORDER — LEVOFLOXACIN IN D5W 750 MG/150ML IV SOLN
750.0000 mg | Freq: Once | INTRAVENOUS | Status: AC
  Administered 2020-07-19: 750 mg via INTRAVENOUS
  Filled 2020-07-19: qty 150

## 2020-07-19 MED ORDER — SODIUM CHLORIDE 0.9 % IV BOLUS
1000.0000 mL | Freq: Once | INTRAVENOUS | Status: AC
  Administered 2020-07-19: 1000 mL via INTRAVENOUS

## 2020-07-19 MED ORDER — SODIUM CHLORIDE 0.9 % IV SOLN
2.0000 g | INTRAVENOUS | Status: DC
  Administered 2020-07-20: 2 g via INTRAVENOUS
  Filled 2020-07-19 (×2): qty 20

## 2020-07-19 MED ORDER — ACETAMINOPHEN 325 MG PO TABS
650.0000 mg | ORAL_TABLET | Freq: Four times a day (QID) | ORAL | Status: DC | PRN
  Administered 2020-07-20 (×2): 650 mg via ORAL
  Filled 2020-07-19 (×2): qty 2

## 2020-07-19 MED ORDER — INSULIN ASPART 100 UNIT/ML ~~LOC~~ SOLN
0.0000 [IU] | Freq: Three times a day (TID) | SUBCUTANEOUS | Status: DC
  Filled 2020-07-19: qty 0.09

## 2020-07-19 MED ORDER — LACTATED RINGERS IV SOLN
INTRAVENOUS | Status: DC
  Administered 2020-07-19: 1000 mL via INTRAVENOUS

## 2020-07-19 MED ORDER — PROPOFOL 1000 MG/100ML IV EMUL
INTRAVENOUS | Status: AC
  Filled 2020-07-19: qty 200

## 2020-07-19 MED ORDER — ACETAMINOPHEN 650 MG RE SUPP: 650.0000 mg | Freq: Four times a day (QID) | RECTAL | Status: DC | PRN

## 2020-07-19 MED ORDER — FESOTERODINE FUMARATE ER 8 MG PO TB24
8.0000 mg | ORAL_TABLET | Freq: Every day | ORAL | Status: DC
  Administered 2020-07-20 – 2020-07-21 (×2): 8 mg via ORAL
  Filled 2020-07-19 (×2): qty 1

## 2020-07-19 MED ORDER — ALBUTEROL SULFATE HFA 108 (90 BASE) MCG/ACT IN AERS: 2.0000 | INHALATION_SPRAY | RESPIRATORY_TRACT | Status: DC | PRN

## 2020-07-19 MED ORDER — PROPOFOL 500 MG/50ML IV EMUL
INTRAVENOUS | Status: AC
  Filled 2020-07-19: qty 100

## 2020-07-19 MED ORDER — PANTOPRAZOLE SODIUM 40 MG PO TBEC
40.0000 mg | DELAYED_RELEASE_TABLET | Freq: Every day | ORAL | Status: DC
  Administered 2020-07-20 – 2020-07-21 (×2): 40 mg via ORAL
  Filled 2020-07-19 (×2): qty 1

## 2020-07-19 MED ORDER — ONDANSETRON HCL 4 MG/2ML IJ SOLN: 4.0000 mg | Freq: Four times a day (QID) | INTRAMUSCULAR | Status: DC | PRN

## 2020-07-19 MED ORDER — LEVOFLOXACIN 750 MG PO TABS: 750.0000 mg | ORAL_TABLET | Freq: Every day | ORAL | 0 refills | Status: DC

## 2020-07-19 MED ORDER — GABAPENTIN 100 MG PO CAPS: 100.0000 mg | ORAL_CAPSULE | Freq: Every day | ORAL | Status: DC | PRN

## 2020-07-19 MED ORDER — LIDOCAINE HCL (CARDIAC) PF 100 MG/5ML IV SOSY
PREFILLED_SYRINGE | INTRAVENOUS | Status: DC | PRN
  Administered 2020-07-19: 100 mg via INTRAVENOUS

## 2020-07-19 MED ORDER — ALBUTEROL SULFATE HFA 108 (90 BASE) MCG/ACT IN AERS
4.0000 | INHALATION_SPRAY | Freq: Once | RESPIRATORY_TRACT | Status: AC
  Administered 2020-07-19: 4 via RESPIRATORY_TRACT
  Filled 2020-07-19: qty 6.7

## 2020-07-19 MED ORDER — PROPOFOL 500 MG/50ML IV EMUL
INTRAVENOUS | Status: DC | PRN
  Administered 2020-07-19: 75 ug/kg/min via INTRAVENOUS

## 2020-07-19 SURGICAL SUPPLY — 22 items

## 2020-07-19 NOTE — Interval H&P Note (Signed)
History and Physical Interval Note:  07/19/2020 7:43 AM  [name]  has presented today for surgery, with the diagnosis of screening.  The various methods of treatment have been discussed with the patient and family. After consideration of risks, benefits and other options for treatment, the patient has consented to  Procedure(s): COLONOSCOPY WITH PROPOFOL (N/A) as a surgical intervention.  The patient's history has been reviewed, patient examined, no change in status, stable for surgery.  I have reviewed the patient's chart and labs.  Questions were answered to the patient's satisfaction.     [name]

## 2020-07-19 NOTE — Discharge Instructions (Signed)
DO NOT TAKE ANY MORE ENOXAPARIN (LOVENOX)  RESUME XARELTO AT YOUR USUAL DOSE TOMORROW   _________________________________________________ YOU HAD AN ENDOSCOPIC PROCEDURE TODAY: Refer to the procedure report and other information in the discharge instructions given to you for any specific questions about what was found during the examination. If this information does not answer your questions, please call [place] office at [phone] to clarify.   YOU SHOULD EXPECT: Some feelings of bloating in the abdomen. Passage of more gas than usual. Walking can help get rid of the air that was put into your GI tract during the procedure and reduce the bloating. If you had a lower endoscopy (such as a colonoscopy or flexible sigmoidoscopy) you may notice spotting of blood in your stool or on the toilet paper. Some abdominal soreness may be present for a day or two, also.  DIET: Your first meal following the procedure should be a light meal and then it is ok to progress to your normal diet. A half-sandwich or bowl of soup is an example of a good first meal. Heavy or fried foods are harder to digest and may make you feel nauseous or bloated. Drink plenty of fluids but you should avoid alcoholic beverages for 24 hours. If you had a esophageal dilation, please see attached instructions for diet.    ACTIVITY: Your care partner should take you home directly after the procedure. You should plan to take it easy, moving slowly for the rest of the day. You can resume normal activity the day after the procedure however YOU SHOULD NOT DRIVE, use power tools, machinery or perform tasks that involve climbing or major physical exertion for 24 hours (because of the sedation medicines used during the test).   SYMPTOMS TO REPORT IMMEDIATELY: A gastroenterologist can be reached at any hour. Please call [phone]  for any of the following symptoms:  . Following lower endoscopy (colonoscopy, flexible sigmoidoscopy) Excessive  amounts of blood in the stool  Significant tenderness, worsening of abdominal pains  Swelling of the abdomen that is new, acute  Fever of 100 or higher    FOLLOW UP:  If any biopsies were taken you will be contacted by phone or by letter within the next 1-3 weeks. Call [phone]  if you have not heard about the biopsies in 3 weeks.  Please also call with any specific questions about appointments or follow up tests.

## 2020-07-19 NOTE — Telephone Encounter (Signed)
Patient is currently in ED 

## 2020-07-19 NOTE — Telephone Encounter (Signed)
I just received your message after finishing a procedure.  That is unquestionably an emergency and she needs to go to the nearest ED by EMS.  - HD

## 2020-07-19 NOTE — ED Triage Notes (Signed)
Pt states she had colonoscopy this morning. Has been throwing up blood and SOB since getting home at 0930 today. Also endorsing cont diarrhea after bowel prep. Speaking in full sentences, spo2 WNL

## 2020-07-19 NOTE — Telephone Encounter (Signed)
Pt states she had a colonoscopy this morning with Dr Loletha Carrow at the hospital but she is coughing up blood and trouble breathing, pt would like a call back ASAP.

## 2020-07-19 NOTE — Transfer of Care (Signed)
Immediate Anesthesia Transfer of Care Note  Patient: [name]  Procedure(s) Performed: COLONOSCOPY WITH PROPOFOL (N/A ) POLYPECTOMY  Patient Location: Endoscopy Unit  Anesthesia Type:MAC  Level of Consciousness: awake, alert , oriented and patient cooperative  Airway & Oxygen Therapy: Patient Spontanous Breathing  Post-op Assessment: Report given to RN and Post -op Vital signs reviewed and stable  Post vital signs: Reviewed and stable--88-89% on RA at 0841; patient awake and following commands appropriately; O2 via FM applied at 0844 with improving spo2  Last Vitals:  Vitals Value Taken Time  BP 120/63 07/19/20 0840  Temp 36.4 C 07/19/20 0840  Pulse 97 07/19/20 0841  Resp 24 07/19/20 0841  SpO2 93% on O2 07/19/20 0844  Vitals shown include unvalidated device data.  Last Pain:  Vitals:   07/19/20 0840  TempSrc: Oral  PainSc:          Complications: No complications documented.

## 2020-07-19 NOTE — ED Notes (Signed)
The patient has been able to urinate, however the samples have been contaminated.

## 2020-07-19 NOTE — Op Note (Signed)
[facility] Patient Name: [name] Procedure Date: 07/19/2020 MRN: [id number] Attending MD: Estill Cotta. Loletha Carrow , MD Date of Birth: 1965-04-15 CSN: [id number] Age: 55 Admit Type: Outpatient Procedure:                Colonoscopy Indications:              Screening for colorectal malignant neoplasm, This                            is the patient's first colonoscopy Providers:                Mallie Mussel L. Loletha Carrow, MD, Mikey College, RN, Cherylynn Ridges, Technician, Hedy Camara CRNA Referring MD:              Medicines:                Monitored Anesthesia Care Complications:            No immediate complications. Estimated Blood Loss:     Estimated blood loss was minimal. Procedure:                Pre-Anesthesia Assessment:                           - Prior to the procedure, a History and Physical                            was performed, and patient medications and                            allergies were reviewed. The patient's tolerance of                            previous anesthesia was also reviewed. The risks                            and benefits of the procedure and the sedation                            options and risks were discussed with the patient.                            All questions were answered, and informed consent                            was obtained. Prior Anticoagulants: The patient has                            taken Xarelto (rivaroxaban), last dose was 2 days                            prior to procedure. ASA Grade Assessment: III - A  patient with severe systemic disease. After                            reviewing the risks and benefits, the patient was                            deemed in satisfactory condition to undergo the                            procedure.                           After obtaining informed consent, the colonoscope                            was passed under  direct vision. Throughout the                            procedure, the patient's blood pressure, pulse, and                            oxygen saturations were monitored continuously. The                            CF-HQ190L ([id number] Olympus colonoscope was                            introduced through the anus and advanced to the the                            cecum, identified by appendiceal orifice and                            ileocecal valve. The colonoscopy was performed with                            difficulty due to a redundant colon, significant                            looping and the patient's body habitus. Successful                            completion of the procedure was aided by using                            manual pressure. The patient tolerated the                            procedure fairly well. The quality of the bowel                            preparation was fair, particularly in the left  colon (see photos). The ileocecal valve,                            appendiceal orifice, and rectum were photographed.                            The bowel preparation used was Plenvu. Scope In: 7:57:52 AM Scope Out: 8:32:41 AM Scope Withdrawal Time: 0 hours 25 minutes 43 seconds  Total Procedure Duration: 0 hours 34 minutes 49 seconds  Findings:      The perianal and digital rectal examinations were normal.      A 2 mm polyp was found in the splenic flexure. The polyp was sessile.       The polyp was removed with a cold snare. Resection and retrieval were       complete.      A 6 mm polyp was found in the mid rectum. The polyp was pedunculated.       The polyp was removed with a hot snare. Resection and retrieval were       complete. To prevent bleeding post-intervention, one hemostatic clip was       successfully placed (MR conditional).      The exam was otherwise without abnormality on direct and retroflexion       views. Impression:                - Preparation of the colon was fair.                           - One 2 mm polyp at the splenic flexure, removed                            with a cold snare. Resected and retrieved.                           - One 6 mm polyp in the mid rectum, removed with a                            hot snare. Resected and retrieved. Clip (MR                            conditional) was placed.                           - The examination was otherwise normal on direct                            and retroflexion views. Moderate Sedation:      MAC sedation used Recommendation:           - Patient has a contact number available for                            emergencies. The signs and symptoms of potential                            delayed complications were discussed with the  patient. Return to normal activities tomorrow.                            Written discharge instructions were provided to the                            patient.                           - Resume previous diet.                           - Resume Xarelto (rivaroxaban) at prior dose                            tomorrow.                           - Await pathology results.                           - Repeat colonoscopy in 3 years for surveillance                            due to prep quality. (2-day prep for next exam) Procedure Code(s):        --- Professional ---                           [phone], Colonoscopy, flexible; with removal of                            tumor(s), polyp(s), or other lesion(s) by snare                            technique Diagnosis Code(s):        --- Professional ---                           Z12.11, Encounter for screening for malignant                            neoplasm of colon                           K63.5, Polyp of colon                           K62.1, Rectal polyp CPT copyright 2019 [employer]. All rights reserved. The codes documented in this  report are preliminary and upon coder review may  be revised to meet current compliance requirements. Pegah Segel L. Loletha Carrow, MD 07/19/2020 8:45:25 AM This report has been signed electronically. Number of Addenda: 0

## 2020-07-19 NOTE — H&P (Signed)
History:  This patient presents for endoscopic testing for colon cancer screening.  [name] Referring physician: Leeanne Rio, MD  Past Medical History: Past Medical History:  Diagnosis Date  . Allergy   . Anemia   . Anxiety   . Arthritis   . Blood transfusion without reported diagnosis   . Cough   . Cough 06/11/2018  . Difficulty sleeping 06/11/2018  . Diverticulitis with perforation 2010  . DVT (deep vein thrombosis) in pregnancy 11/24/2016  . DVT, lower extremity ([place])   . Endometrial cancer ([place])   . Enlarged lymph nodes 08/13/2017  . Family history of adverse reaction to anesthesia    pts daughter had difficulty awakening following anesthesia, long time to wake up  . GERD (gastroesophageal reflux disease)   . Headache   . Hemoptysis 04/22/2018  . History of bronchitis   . History of ear infections   . Hospital discharge follow-up 03/02/2019  . Hx of migraines   . Hyperlipidemia   . Hypertension   . IBS (irritable bowel syndrome)   . Kidney infection   . Lupus (systemic lupus erythematosus) ([place]) 02/2017  . Morbid obesity ([place])   . Neck pain 01/13/2019  . Ovarian cancer ([place]) dx'd 01/2015  . PONV (postoperative nausea and vomiting)   . Port-A-Cath in place    Right side  . Portacath in place 09/12/2015  . Pre-diabetes   . Pyelonephritis   . Right knee pain 11/28/2011  . Scleroderma ([place])   . Screen for colon cancer 03/02/2019  . Slow rate of speech 08/22/2015   Chronic from CVA.  She also has loss of left nasolabial fold and slight left facial droop/small asymmetry on the left side secondary to CVA  . UTI (urinary tract infection) 05/07/2018     Past Surgical History: Past Surgical History:  Procedure Laterality Date  . ABDOMINAL HYSTERECTOMY N/A 03/13/2015   Procedure: TOTAL HYSTERECTOMY ABDOMINAL BILATERAL SALPINGO OOPHORECTOMY RADICAL TUMOR [place];  Surgeon: Everitt Amber, MD;  Location: [facility];  Service: Gynecology;  Laterality: N/A;  . BOWEL  RESECTION  03/13/2015   Procedure: SMALL BOWEL RESECTION;  Surgeon: Everitt Amber, MD;  Location: [facility];  Service: Gynecology;;  . [place] and 1984  . COLOSTOMY  2008  . COLOSTOMY TAKEDOWN    . ESOPHAGOGASTRODUODENOSCOPY (EGD) WITH PROPOFOL N/A 12/21/2018   Procedure: ESOPHAGOGASTRODUODENOSCOPY (EGD) WITH PROPOFOL;  Surgeon: Doran Stabler, MD;  Location: [facility];  Service: Gastroenterology;  Laterality: N/A;  . LAPAROTOMY N/A 03/13/2015   Procedure: EXPLORATORY LAPAROTOMY;  Surgeon: Everitt Amber, MD;  Location: [facility];  Service: Gynecology;  Laterality: N/A;  . TONSILLECTOMY AND ADENOIDECTOMY  1997  . VENTRAL HERNIA REPAIR  03/13/2015   Procedure: HERNIA REPAIR VENTRAL ADULT;  Surgeon: Everitt Amber, MD;  Location: [facility];  Service: Gynecology;;    Allergies: Allergies  Allergen Reactions  . Penicillins Rash    Has patient had a PCN reaction causing immediate rash, facial/tongue/throat swelling, SOB or lightheadedness with hypotension: no Has patient had a PCN reaction causing severe rash involving mucus membranes or skin necrosis: no Has patient had a PCN reaction that required hospitalization in the hospital at the time Has patient had a PCN reaction occurring within the last 10 years: no If all of the above answers are "NO", then may proceed with Cephalosporin use.     Outpatient Meds: Current Facility-Administered Medications  Medication Dose Route Frequency Provider Last Rate Last Admin  . lactated ringers infusion  Intravenous Continuous Doran Stabler, MD 125 mL/hr at 07/19/20 0735 1,000 mL at 07/19/20 0735   Facility-Administered Medications Ordered in Other Encounters  Medication Dose Route Frequency Provider Last Rate Last Admin  . sodium chloride flush (NS) 0.9 % injection 10 mL  10 mL Intravenous PRN Livesay, Lennis P, MD   10 mL at 05/31/15 0300  . sodium chloride flush (NS) 0.9 % injection 10 mL  10 mL Intravenous PRN Livesay, Lennis P, MD   10 mL at  08/02/15 1442      ___________________________________________________________________ Objective   Exam:  BP (!) 148/60   Pulse 78   Temp 98.6 F (37 C) (Oral)   Resp 18   Ht 5' 5.5" (1.664 m)   Wt (!) 167.8 kg   LMP 10/26/2013   SpO2 100%   BMI 60.63 kg/m    CV: RRR without murmur, S1/S2, no JVD, no peripheral edema  Resp: clear to auscultation bilaterally, normal RR and effort noted  GI: soft, no tenderness, with active bowel sounds. No guarding or palpable organomegaly noted, limited by body habitus  Neuro: awake, alert and oriented x 3. Normal gross motor function and fluent speech   Assessment:  Average risk for colon cancer  Plan:  Screening colonoscopy.  The benefits and risks of the planned procedure were described in detail with the patient or (when appropriate) their health care proxy.  Risks were outlined as including, but not limited to, bleeding, infection, perforation, adverse medication reaction leading to cardiac or pulmonary decompensation, pancreatitis (if ERCP).  The limitation of incomplete mucosal visualization was also discussed.  No guarantees or warranties were given. Patient at increased risk for cardiopulmonary complications of procedure due to medical comorbidities.     [name]

## 2020-07-19 NOTE — Telephone Encounter (Signed)
Left detailed voicemail advising patient to go to the ER for evaluation of her symptoms. Attempted to reach patient again and I was able to speak with her. Advised that she go to the ED for evaluation, she did sound like she was having some difficulty breathing. She states that she does have someone available to take her.

## 2020-07-19 NOTE — ED Provider Notes (Addendum)
[facility] DEPT Provider Note   CSN: [id number] Arrival date & time: 07/19/20  1217     History Chief Complaint  Patient presents with  . Vomiting    [name] is a 55 y.o. female with pertient past medical history of DVT, endometrial and ovarian cancer in remission, allergies, lupus, morbid obesity, hypertension CKD stage III presents emerged department today for hematemesis and shortness of breath.  Patient states that she had colonoscopy this morning, patient states that after she was done with the procedure she was waiting in the waiting room and started having a cough, states that she cannot stop coughing.  Started spitting up blood with her cough.  Was not eating or drinking when this occurred. States that excessive coughing caused her to vomit, states that there was small amount of blood in there as well.  Patient says that she started having shortness of breath, still having this.  States that she took her albuterol at home which did not help.  States that she also felt as if she was hot and had a fever when she got home.  States that she was in her complete normal state of health when she woke up this morning and prior to the procedure.  States that she was nauseous but is not currently.  Denies any problem urinating. Patient states that she does have history of DVTs, is on Xarelto, states that she switched to Lovenox in the last 2 days, has been compliant with that.  Did take it yesterday.  States that she has had a PE in the past.  also states that she is continued diarrhea after her bowel prep, feels weak.  No abdominal pain.  No bloody bowel movements.  Denies any chest pain.  HPI     Past Medical History:  Diagnosis Date  . Allergy   . Anemia   . Anxiety   . Arthritis   . Blood transfusion without reported diagnosis   . Cough   . Cough 06/11/2018  . Difficulty sleeping 06/11/2018  . Diverticulitis with perforation 2010  . DVT (deep vein  thrombosis) in pregnancy 11/24/2016  . DVT, lower extremity ([place])   . Endometrial cancer ([place])   . Enlarged lymph nodes 08/13/2017  . Family history of adverse reaction to anesthesia    pts daughter had difficulty awakening following anesthesia, long time to wake up  . GERD (gastroesophageal reflux disease)   . Headache   . Hemoptysis 04/22/2018  . History of bronchitis   . History of ear infections   . Hospital discharge follow-up 03/02/2019  . Hx of migraines   . Hyperlipidemia   . Hypertension   . IBS (irritable bowel syndrome)   . Kidney infection   . Lupus (systemic lupus erythematosus) ([place]) 02/2017  . Morbid obesity ([place])   . Neck pain 01/13/2019  . Ovarian cancer ([place]) dx'd 01/2015  . PONV (postoperative nausea and vomiting)   . Port-A-Cath in place    Right side  . Portacath in place 09/12/2015  . Pre-diabetes   . Pyelonephritis   . Right knee pain 11/28/2011  . Scleroderma ([place])   . Screen for colon cancer 03/02/2019  . Slow rate of speech 08/22/2015   Chronic from CVA.  She also has loss of left nasolabial fold and slight left facial droop/small asymmetry on the left side secondary to CVA  . UTI (urinary tract infection) 05/07/2018    Patient Active Problem List   Diagnosis Date Noted  .  Acute nasopharyngitis 05/18/2020  . Incontinence 03/17/2020  . Need for immunization against influenza 01/05/2020  . Herpes simplex virus (HSV) infection of buttock 01/05/2020  . OSA (obstructive sleep apnea) 07/25/2019  . Prediabetes 07/25/2019  . Pneumonia 02/19/2019  . Lateral epicondylitis of left elbow 01/13/2019  . Normocytic anemia 05/29/2018  . Scleroderma ([place]) 04/22/2018  . Swelling of foot joint, right 04/08/2018  . Positive ANA (antinuclear antibody) 09/23/2016  . Eczema 01/27/2016  . Pulmonary embolism ([place]) 09/12/2015  . Long term current use of anticoagulant therapy 09/06/2015  . Chemotherapy-induced peripheral neuropathy ([place]) 06/13/2015  . CKD (chronic kidney  disease) stage 3, GFR 30-59 ml/min (HCC) 05/12/2015  . GERD (gastroesophageal reflux disease) 05/12/2015  . 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 Endometrial ca ([place]) 03/29/2015  . Ventral hernia without obstruction or gangrene   . Morbid obesity ([place]) 02/19/2015  . Essential hypertension, benign   . Stress at home 12/14/2014  . Left knee pain 08/04/2013  . Seasonal allergies 07/29/2013    Past Surgical History:  Procedure Laterality Date  . ABDOMINAL HYSTERECTOMY N/A 03/13/2015   Procedure: TOTAL HYSTERECTOMY ABDOMINAL BILATERAL SALPINGO OOPHORECTOMY RADICAL TUMOR [place];  Surgeon: Everitt Amber, MD;  Location: [facility];  Service: Gynecology;  Laterality: N/A;  . BOWEL RESECTION  03/13/2015   Procedure: SMALL BOWEL RESECTION;  Surgeon: Everitt Amber, MD;  Location: [facility];  Service: Gynecology;;  . [place] and 1984  . COLOSTOMY  2008  . COLOSTOMY TAKEDOWN    . ESOPHAGOGASTRODUODENOSCOPY (EGD) WITH PROPOFOL N/A 12/21/2018   Procedure: ESOPHAGOGASTRODUODENOSCOPY (EGD) WITH PROPOFOL;  Surgeon: Doran Stabler, MD;  Location: [facility];  Service: Gastroenterology;  Laterality: N/A;  . LAPAROTOMY N/A 03/13/2015   Procedure: EXPLORATORY LAPAROTOMY;  Surgeon: Everitt Amber, MD;  Location: [facility];  Service: Gynecology;  Laterality: N/A;  . TONSILLECTOMY AND ADENOIDECTOMY  1997  . VENTRAL HERNIA REPAIR  03/13/2015   Procedure: HERNIA REPAIR VENTRAL ADULT;  Surgeon: Everitt Amber, MD;  Location: [facility];  Service: Gynecology;;     OB History    Gravida  2   Para      Term      Preterm      AB      Living  2     SAB      IAB      Ectopic      Multiple      Live Births  2           Family History  Problem Relation Age of Onset  . Diabetes Mother   . Hypertension Mother   . Hyperlipidemia Mother   . Colon polyps Mother        4 total  . Fibroids Mother        s/p hysterectomy  . Hyperlipidemia Father   . Diabetes Daughter   . Hodgkin's lymphoma  Daughter 41  . Asthma Maternal Aunt        severe - d. 45  . Prostate cancer Maternal Uncle 60  . Diabetes Paternal Aunt   . Diabetes Paternal Uncle   . Kidney failure Maternal Grandmother 84  . Pancreatic cancer Paternal Grandmother        dx. >50  . Diabetes Paternal Grandfather   . Diabetes Paternal Aunt   . Heart disease Neg Hx   . Stroke Neg Hx   . Colon cancer Neg Hx   . Stomach cancer Neg Hx   . Esophageal cancer Neg Hx   .  Rectal cancer Neg Hx     Social History   Tobacco Use  . Smoking status: Never Smoker  . Smokeless tobacco: Never Used  Vaping Use  . Vaping Use: Never used  Substance Use Topics  . Alcohol use: Yes    Comment: Maybe wine every 6 months  . Drug use: No    Home Medications Prior to Admission medications   Medication Sig Start Date End Date Taking? Authorizing Provider  levofloxacin (LEVAQUIN) 750 MG tablet Take 1 tablet (750 mg total) by mouth daily for 5 days. 07/19/20 07/24/20 Yes Lillyn Wieczorek, Deberah Pelton, PA-C  acetaminophen (TYLENOL) 500 MG tablet Take 1,000 mg by mouth every 6 (six) hours as needed for moderate pain.    [provider]  albuterol (PROVENTIL) (2.5 MG/3ML) 0.083% nebulizer solution Take 3 mLs (2.5 mg total) by nebulization every 4 (four) hours as needed for wheezing or shortness of breath. 05/21/20   Leeanne Rio, MD  albuterol (VENTOLIN HFA) 108 (90 Base) MCG/ACT inhaler Inhale 2 puffs into the lungs every 6 (six) hours as needed for wheezing or shortness of breath. Please instruct patient in usage. 12/19/18   Leeanne Rio, MD  amLODipine (NORVASC) 5 MG tablet Take 5 mg by mouth daily.    [provider]  B-D UF III MINI PEN NEEDLES 31G X 5 MM MISC  07/28/19   [provider]  cetirizine (ZYRTEC) 10 MG tablet Take 1 tablet (10 mg total) by mouth daily as needed for allergies (itching). 01/22/16   Leeanne Rio, MD  Cholecalciferol (VITAMIN D) 50 MCG (2000 UT) CAPS Take 8,000 Units by mouth  daily.    [provider]  diclofenac Sodium (VOLTAREN) 1 % GEL Apply to knee once daily as needed Patient taking differently: Apply 1 application topically daily as needed (knee pain). Apply to knee once daily as needed 08/24/19   Leeanne Rio, MD  furosemide (LASIX) 20 MG tablet Take 20 mg by mouth daily as needed for edema.    [provider]  gabapentin (NEURONTIN) 100 MG capsule Take 1 capsule (100 mg total) by mouth daily as needed (neuropathy). 02/01/19   Leeanne Rio, MD  Insulin Pen Needle 31G X 5 MM MISC Use as directed with Victoza 09/16/19   [provider]  lidocaine-prilocaine (EMLA) cream Apply 1 application topically as needed (port access).    [provider]  liraglutide (VICTOZA) 18 MG/3ML SOPN Inject 18 mg into the skin daily.    [provider]  omeprazole (PRILOSEC) 40 MG capsule Take 1 capsule (40 mg total) by mouth daily. 07/06/20   Doran Stabler, MD  TOVIAZ 8 MG TB24 tablet TAKE 1 TABLET(8 MG) BY MOUTH DAILY Patient taking differently: Take 8 mg by mouth daily. 04/03/20   Leeanne Rio, MD  triamcinolone (KENALOG) 0.1 % APPLY TO THE AFFECTED AREA TWICE DAILY AS NEEDED FOR SKIN IRRITATION Patient taking differently: Apply 1 application topically 2 (two) times daily as needed (irritation). 06/12/20   Leeanne Rio, MD  valACYclovir (VALTREX) 500 MG tablet Take 1 tablet (500 mg total) by mouth 2 (two) times daily. For 3 days at start of outbreak Patient taking differently: Take 500 mg by mouth 2 (two) times daily as needed (outbreak). For 3 days at start of outbreak 03/21/20   Leeanne Rio, MD    Allergies    Penicillins  Review of Systems   Review of Systems  Constitutional: Positive for fatigue and fever.  Negative for chills and diaphoresis.  HENT: Negative for congestion, sore throat and trouble swallowing.   Eyes: Negative for pain and visual disturbance.  Respiratory: Positive for  cough, shortness of breath and wheezing.   Cardiovascular: Negative for chest pain, palpitations and leg swelling.  Gastrointestinal: Positive for diarrhea, nausea and vomiting. Negative for abdominal distention and abdominal pain.  Genitourinary: Negative for difficulty urinating.  Musculoskeletal: Negative for back pain, neck pain and neck stiffness.  Skin: Negative for pallor.  Neurological: Negative for dizziness, speech difficulty, weakness and headaches.  Psychiatric/Behavioral: Negative for confusion.    Physical Exam Updated Vital Signs BP (!) 156/79   Pulse (!) 109   Temp 99.1 F (37.3 C) (Oral)   Resp 18   LMP 10/26/2013   SpO2 94%   Physical Exam Constitutional:      General: She is not in acute distress.    Appearance: Normal appearance. She is obese. She is not ill-appearing, toxic-appearing or diaphoretic.  HENT:     Mouth/Throat:     Mouth: Mucous membranes are moist.     Pharynx: Oropharynx is clear.  Eyes:     General: No scleral icterus.    Extraocular Movements: Extraocular movements intact.     Pupils: Pupils are equal, round, and reactive to light.  Cardiovascular:     Rate and Rhythm: Regular rhythm. Tachycardia present.     Pulses: Normal pulses.     Heart sounds: Normal heart sounds.  Pulmonary:     Effort: Pulmonary effort is normal. No respiratory distress.     Breath sounds: No stridor. Wheezing present. No rhonchi or rales.  Chest:     Chest wall: No tenderness.  Abdominal:     General: Abdomen is flat. There is no distension.     Palpations: Abdomen is soft.     Tenderness: There is no abdominal tenderness. There is no guarding or rebound.     Comments: No abdominal tenderness, soft.   Musculoskeletal:        General: No swelling or tenderness. Normal range of motion.     Cervical back: Normal range of motion and neck supple. No rigidity.     Right lower leg: No edema.     Left lower leg: No edema.  Skin:    General: Skin is warm and  dry.     Capillary Refill: Capillary refill takes less than 2 seconds.     Coloration: Skin is not pale.  Neurological:     General: No focal deficit present.     Mental Status: She is alert and oriented to person, place, and time.  Psychiatric:        Mood and Affect: Mood normal.        Behavior: Behavior normal.     ED Results / Procedures / Treatments   Labs (all labs ordered are listed, but only abnormal results are displayed) Labs Reviewed  COMPREHENSIVE METABOLIC PANEL - Abnormal; Notable for the following components:      Result Value   Sodium 147 (*)    Chloride 114 (*)    CO2 21 (*)    Glucose, Bld 117 (*)    All other components within normal limits  CBC WITH DIFFERENTIAL/PLATELET - Abnormal; Notable for the following components:   RBC 3.85 (*)    Hemoglobin 11.1 (*)    HCT 35.3 (*)    Lymphs Abs 0.6 (*)    All other components within normal limits  TROPONIN I (HIGH SENSITIVITY) -  Abnormal; Notable for the following components:   Troponin I (High Sensitivity) 50 (*)    All other components within normal limits  RESP PANEL BY RT-PCR (FLU A&B, COVID) ARPGX2  LIPASE, BLOOD  URINALYSIS, ROUTINE W REFLEX MICROSCOPIC  TROPONIN I (HIGH SENSITIVITY)    EKG EKG Interpretation  Date/Time:  Thursday July 19 2020 13:11:03 EDT Ventricular Rate:  126 PR Interval:  149 QRS Duration: 91 QT Interval:  306 QTC Calculation: 443 R Axis:   -15 Text Interpretation: Sinus tachycardia Borderline left axis deviation Borderline low voltage, extremity leads 12 Lead; [place] rate is faster compared to May 2021 Confirmed by Sherwood Gambler [phone]) on 07/19/2020 1:21:10 PM   Radiology CT Angio Chest PE W/Cm &/Or Wo Cm  Result Date: 07/19/2020 CLINICAL DATA:  Hemoptysis, shortness of breath, colonoscopy earlier today. EXAM: CT ANGIOGRAPHY CHEST WITH CONTRAST TECHNIQUE: Multidetector CT imaging of the chest was performed using the standard protocol during bolus administration of  intravenous contrast. Multiplanar CT image reconstructions and MIPs were obtained to evaluate the vascular anatomy. CONTRAST:  24mL OMNIPAQUE IOHEXOL 350 MG/ML SOLN COMPARISON:  05/29/2018 FINDINGS: Cardiovascular: Limited because of injection pressure limitations from the port catheter and body habitus. No large central or proximal hilar acute filling defect or pulmonary embolus by CTA. Pulmonary arteries are normal in caliber. Limited assessment of the peripheral segmental and subsegmental branches for small emboli. Intact aorta. Pulsation artifact across the ascending aorta. No aneurysm or dissection. No mediastinal hemorrhage or hematoma. Normal heart size without pericardial effusion. Right IJ power port catheter tip in the right atrium. Mediastinum/Nodes: Thyroid unremarkable. Trachea and central airways are patent. Lower esophagus is fluid filled with an air-fluid level. Small to moderate hiatal hernia noted. No bulky adenopathy. Lungs/Pleura: Patchy bilateral airspace process involving all lobes of both lungs but certainly more severe and consolidative throughout the left lung compatible with infectious/inflammatory process such as pneumonia possibly related to aspiration. Other acute pneumonitis or acute pulmonary hemorrhage could have a similar appearance. No pleural abnormality, effusion or pneumothorax. Upper Abdomen: No acute abnormality. Musculoskeletal: Degenerative changes of the thoracic spine. Obese body habitus. No acute compression fracture. Intact sternum. Review of the MIP images confirms the above findings. IMPRESSION: Limited CTA but no significant central or proximal hilar large acute pulmonary embolus. Difficult to exclude small peripheral emboli. Asymmetric diffuse bilateral patchy airspace process, more consolidated throughout the left lung and most severe in the left lower lobe. Findings concerning for acute pneumonia possibly related to aspiration. Other acute inflammatory pneumonitis,  or acute pulmonary hemorrhage could have a similar appearance. Dilated esophagus with an air-fluid level and associated hiatal hernia. Electronically Signed   By: Jerilynn Mages.  Shick M.D.   On: 07/19/2020 15:29   DG Chest Port 1 View  Result Date: 07/19/2020 CLINICAL DATA:  Shortness of breath, hemoptysis EXAM: PORTABLE CHEST 1 VIEW COMPARISON:  02/19/2019 FINDINGS: Right IJ approach chest port remains in place. Heart size within normal limits. Extensive airspace opacity within the left lower lobe. Right lung is clear. No appreciable pleural fluid collection. No pneumothorax. IMPRESSION: Extensive airspace opacity within the left lower lobe concerning for pneumonia. Radiographic follow-up to resolution recommended. Electronically Signed   By: Davina Poke D.O.   On: 07/19/2020 13:54    Procedures Procedures   Medications Ordered in ED Medications  levofloxacin (LEVAQUIN) IVPB 750 mg (750 mg Intravenous New Bag/Given 07/19/20 1622)  acetaminophen (TYLENOL) tablet 1,000 mg (1,000 mg Oral Given 07/19/20 1341)  albuterol (VENTOLIN HFA) 108 (90 Base) MCG/ACT  inhaler 4 puff (4 puffs Inhalation Given 07/19/20 1341)  iohexol (OMNIPAQUE) 350 MG/ML injection 75 mL (75 mLs Intravenous Contrast Given 07/19/20 1454)  sodium chloride 0.9 % bolus 1,000 mL (1,000 mLs Intravenous New Bag/Given 07/19/20 1617)    ED Course  I have reviewed the triage vital signs and the nursing notes.  Pertinent labs & imaging results that were available during my care of the patient were reviewed by me and considered in my medical decision making (see chart for details).    MDM Rules/Calculators/A&P                          [name] is a 55 y.o. female reported past medical history of DVT, endometrial and ovarian cancer in remission, allergies, lupus, morbid obesity, hypertension CKD stage III presents emerged department today for hematemesis and shortness of breath after having colonospy done.  Patient with temperature  100.2, slightly tachycardic.  Will obtain basic labs and chest x-ray.  Patient does appear well, however does have some wheezing on exam. o2 95%. No respiratory distress. No abdominal tenderness. Vomiting sounds more like hemoptysis rather than hematemesis.   Work-up today shows negative COVID, chest x-ray for concerning pneumonia.  CMP with electrolyte derangements, concerning for dehydration, this does make sense with patient's colonoscopy with bowel prep.  Hemoglobin 11.1, this appears stable.  EKG without any changes, troponin 14.  CT does not show any significant central or proximal large PE, however difficult to exclude small emboli, however patient is on anticoagulants.  Does show consolidation through the left lung base.  Findings concerning for acute pneumonia related to aspiration or other pneumonitis.  Will cover for CAP at this time, Dr. Darl Householder suggest [place].  Upon reevaluation, patient is still tightly tachycardic, 107 however much improved from before.  Did give Levaquin here.  Tachycardia could also be from albuterol use.  Repeat troponin came back at 50, this might be due to right however patient will need third troponin to trend.  Patient is not having any cardiac pain. NO vomiting or hemoptysis here, has been here for 6 hours.   Pt care was handed off to K. Ford PA-C at hand off.  Complete history and physical and current plan have been communicated.  Please refer to their note for the remainder of ED care and ultimate disposition.  Awaiting third troponin. Will need to be ambulated.   I discussed this case with my attending physician who cosigned this note including patient's presenting symptoms, physical exam, and planned diagnostics and interventions. Attending physician stated agreement with plan or made changes to plan which were implemented.   Attending physician assessed patient at bedside.   Final Clinical Impression(s) / ED Diagnoses Final diagnoses:  Community acquired  pneumonia of left lower lobe of lung    Rx / DC Orders ED Discharge Orders         Ordered    levofloxacin (LEVAQUIN) 750 MG tablet  Daily        07/19/20 1607               Alfredia Client, PA-C 07/19/20 1804    Drenda Freeze, MD 07/24/20 [phone]

## 2020-07-19 NOTE — Anesthesia Postprocedure Evaluation (Signed)
Anesthesia Post Note  Patient: [name]  Procedure(s) Performed: COLONOSCOPY WITH PROPOFOL (N/A ) POLYPECTOMY HEMOSTASIS CLIP PLACEMENT     Patient location during evaluation: Endoscopy Anesthesia Type: MAC Level of consciousness: awake Pain management: pain level controlled Vital Signs Assessment: post-procedure vital signs reviewed and stable Respiratory status: spontaneous breathing Cardiovascular status: stable Postop Assessment: no apparent nausea or vomiting Anesthetic complications: no   No complications documented.  Last Vitals:  Vitals:   07/19/20 0900 07/19/20 0910  BP: 122/72 134/85  Pulse: 91 88  Resp: (!) 23 (!) 22  Temp:    SpO2: 94% 90%    Last Pain:  Vitals:   07/19/20 0910  TempSrc:   PainSc: 0-No pain                 Huston Foley

## 2020-07-19 NOTE — H&P (Signed)
History and Physical    [name] TGG:[id number] DOB: [birth year] DOA: 07/19/2020  PCP: Leeanne Rio, MD   Patient coming from: Home   Chief Complaint: Cough, SOB, chest discomfort, bloody sputum   HPI: [name] is a 55 y.o. female with medical history significant for ovarian and endometrial cancer status post surgery and chemotherapy, hypertension, type 2 diabetes mellitus, history of DVT and PE on Xarelto, and BMI 61, now presenting to the emergency department with shortness of breath, cough, bloody sputum, and chest discomfort.  Patient reports that she was experiencing a mild cough, had nausea with small amount of vomiting that she attributed to colonoscopy prep, went in for screening colonoscopy this morning, was reportedly coughing quite a bit during the procedure, felt somewhat short of breath and had increased coughing on her way home, and then worsened fairly rapidly once back home.  She developed progressive shortness of breath, discomfort in the central chest, and has had a cough productive of thick yellowish sputum with streaks of blood.  ED Course: Upon arrival to the ED, patient is found to have a temperature 37.9 C orally, saturation in the mid 80s to 90s on room air, tachypneic as high as 33, tachycardic in the 120s, and with blood pressure 100/73.  EKG features sinus tachycardia with rate 126.  Chest x-ray with extensive airspace opacity involving the left lower lobe.  CTA chest has a limited study but negative for significant central or proximal hilar large acute PE and notable for diffuse bilateral patchy asymmetric airspace process more confluent on the left.  CBC with mild normocytic anemia.  High-sensitivity troponin was 14 then 50 then 69.  ED PA discussed with cardiology who advised that the patient can stay at [facility] where they will consult in the morning.  She was treated with acetaminophen, 2 L of saline, albuterol, and Levaquin.  Review of  Systems:  All other systems reviewed and apart from HPI, are negative.  Past Medical History:  Diagnosis Date  . Allergy   . Anemia   . Anxiety   . Arthritis   . Blood transfusion without reported diagnosis   . Cough   . Cough 06/11/2018  . Difficulty sleeping 06/11/2018  . Diverticulitis with perforation 2010  . DVT (deep vein thrombosis) in pregnancy 11/24/2016  . DVT, lower extremity ([place])   . Endometrial cancer ([place])   . Enlarged lymph nodes 08/13/2017  . Family history of adverse reaction to anesthesia    pts daughter had difficulty awakening following anesthesia, long time to wake up  . GERD (gastroesophageal reflux disease)   . Headache   . Hemoptysis 04/22/2018  . History of bronchitis   . History of ear infections   . Hospital discharge follow-up 03/02/2019  . Hx of migraines   . Hyperlipidemia   . Hypertension   . IBS (irritable bowel syndrome)   . Kidney infection   . Lupus (systemic lupus erythematosus) ([place]) 02/2017  . Morbid obesity ([place])   . Neck pain 01/13/2019  . Ovarian cancer ([place]) dx'd 01/2015  . PONV (postoperative nausea and vomiting)   . Port-A-Cath in place    Right side  . Portacath in place 09/12/2015  . Pre-diabetes   . Pyelonephritis   . Right knee pain 11/28/2011  . Scleroderma ([place])   . Screen for colon cancer 03/02/2019  . Slow rate of speech 08/22/2015   Chronic from CVA.  She also has loss of left nasolabial fold and slight  left facial droop/small asymmetry on the left side secondary to CVA  . UTI (urinary tract infection) 05/07/2018    Past Surgical History:  Procedure Laterality Date  . ABDOMINAL HYSTERECTOMY N/A 03/13/2015   Procedure: TOTAL HYSTERECTOMY ABDOMINAL BILATERAL SALPINGO OOPHORECTOMY RADICAL TUMOR [place];  Surgeon: Everitt Amber, MD;  Location: [facility];  Service: Gynecology;  Laterality: N/A;  . BOWEL RESECTION  03/13/2015   Procedure: SMALL BOWEL RESECTION;  Surgeon: Everitt Amber, MD;  Location: [facility];  Service: Gynecology;;  .  [place] and 1984  . COLOSTOMY  2008  . COLOSTOMY TAKEDOWN    . ESOPHAGOGASTRODUODENOSCOPY (EGD) WITH PROPOFOL N/A 12/21/2018   Procedure: ESOPHAGOGASTRODUODENOSCOPY (EGD) WITH PROPOFOL;  Surgeon: Doran Stabler, MD;  Location: [facility];  Service: Gastroenterology;  Laterality: N/A;  . LAPAROTOMY N/A 03/13/2015   Procedure: EXPLORATORY LAPAROTOMY;  Surgeon: Everitt Amber, MD;  Location: [facility];  Service: Gynecology;  Laterality: N/A;  . TONSILLECTOMY AND ADENOIDECTOMY  1997  . VENTRAL HERNIA REPAIR  03/13/2015   Procedure: HERNIA REPAIR VENTRAL ADULT;  Surgeon: Everitt Amber, MD;  Location: [facility];  Service: Gynecology;;    Social History:   reports that she has never smoked. She has never used smokeless tobacco. She reports current alcohol use. She reports that she does not use drugs.  Allergies  Allergen Reactions  . Penicillins Rash    Has patient had a PCN reaction causing immediate rash, facial/tongue/throat swelling, SOB or lightheadedness with hypotension: no Has patient had a PCN reaction causing severe rash involving mucus membranes or skin necrosis: no Has patient had a PCN reaction that required hospitalization in the hospital at the time Has patient had a PCN reaction occurring within the last 10 years: no If all of the above answers are "NO", then may proceed with Cephalosporin use.     Family History  Problem Relation Age of Onset  . Diabetes Mother   . Hypertension Mother   . Hyperlipidemia Mother   . Colon polyps Mother        4 total  . Fibroids Mother        s/p hysterectomy  . Hyperlipidemia Father   . Diabetes Daughter   . Hodgkin's lymphoma Daughter 62  . Asthma Maternal Aunt        severe - d. 21  . Prostate cancer Maternal Uncle 80  . Diabetes Paternal Aunt   . Diabetes Paternal Uncle   . Kidney failure Maternal Grandmother 84  . Pancreatic cancer Paternal Grandmother        dx. >50  . Diabetes Paternal Grandfather   . Diabetes  Paternal Aunt   . Heart disease Neg Hx   . Stroke Neg Hx   . Colon cancer Neg Hx   . Stomach cancer Neg Hx   . Esophageal cancer Neg Hx   . Rectal cancer Neg Hx      Prior to Admission medications   Medication Sig Start Date End Date Taking? Authorizing Provider  acetaminophen (TYLENOL) 500 MG tablet Take 1,000 mg by mouth every 6 (six) hours as needed for moderate pain.   Yes [provider]  albuterol (PROVENTIL) (2.5 MG/3ML) 0.083% nebulizer solution Take 3 mLs (2.5 mg total) by nebulization every 4 (four) hours as needed for wheezing or shortness of breath. 05/21/20  Yes Leeanne Rio, MD  albuterol (VENTOLIN HFA) 108 (90 Base) MCG/ACT inhaler Inhale 2 puffs into the lungs every 6 (six) hours as needed for wheezing or shortness  of breath. Please instruct patient in usage. 12/19/18  Yes Leeanne Rio, MD  amLODipine (NORVASC) 5 MG tablet Take 5 mg by mouth daily.   Yes [provider]  Cholecalciferol (VITAMIN D) 50 MCG (2000 UT) CAPS Take 8,000 Units by mouth daily.   Yes [provider]  diclofenac Sodium (VOLTAREN) 1 % GEL Apply to knee once daily as needed Patient taking differently: Apply 1 application topically daily as needed (knee pain). Apply to knee once daily as needed 08/24/19  Yes Leeanne Rio, MD  furosemide (LASIX) 20 MG tablet Take 20 mg by mouth daily as needed for edema.   Yes [provider]  gabapentin (NEURONTIN) 100 MG capsule Take 1 capsule (100 mg total) by mouth daily as needed (neuropathy). 02/01/19  Yes Leeanne Rio, MD  levofloxacin (LEVAQUIN) 750 MG tablet Take 1 tablet (750 mg total) by mouth daily for 5 days. 07/19/20 07/24/20 Yes Patel, Deberah Pelton, PA-C  lidocaine-prilocaine (EMLA) cream Apply 1 application topically as needed (port access).   Yes [provider]  liraglutide (VICTOZA) 18 MG/3ML SOPN Inject 18 mg into the skin daily.   Yes [provider]  omeprazole (PRILOSEC) 40 MG  capsule Take 1 capsule (40 mg total) by mouth daily. 07/06/20  Yes Danis, Kirke Corin, MD  TOVIAZ 8 MG TB24 tablet TAKE 1 TABLET(8 MG) BY MOUTH DAILY Patient taking differently: Take 8 mg by mouth daily. 04/03/20  Yes Leeanne Rio, MD  triamcinolone (KENALOG) 0.1 % APPLY TO THE AFFECTED AREA TWICE DAILY AS NEEDED FOR SKIN IRRITATION Patient taking differently: Apply 1 application topically 2 (two) times daily as needed (irritation). 06/12/20  Yes Leeanne Rio, MD  valACYclovir (VALTREX) 500 MG tablet Take 1 tablet (500 mg total) by mouth 2 (two) times daily. For 3 days at start of outbreak Patient taking differently: Take 500 mg by mouth 2 (two) times daily as needed (outbreak). For 3 days at start of outbreak 03/21/20  Yes Leeanne Rio, MD  B-D UF III MINI PEN NEEDLES 31G X 5 MM MISC  07/28/19   [provider]  Insulin Pen Needle 31G X 5 MM MISC Use as directed with Victoza 09/16/19   [provider]    Physical Exam: Vitals:   07/19/20 2000 07/19/20 2100 07/19/20 2130 07/19/20 2200  BP: 140/90  100/73 (!) 115/50  Pulse: (!) 108  (!) 106 97  Resp: 18  (!) 27 19  Temp:      TempSrc:      SpO2: 93% 92% 91% 98%    Constitutional: NAD, calm  Eyes: PERTLA, lids and conjunctivae normal ENMT: Mucous membranes are moist. Posterior pharynx clear of any exudate or lesions.   Neck: normal, supple, no masses, no thyromegaly Respiratory: Tachypneic, speaking full sentences. Scattered rhonchi, wheeze. No accessory muscle use.  Cardiovascular: Rate ~100 and regular. No significant extremity edema.  Abdomen: No distension, no tenderness, soft. Bowel sounds active.  Musculoskeletal: no clubbing / cyanosis. No joint deformity upper and lower extremities.   Skin: no significant rashes, lesions, ulcers. Warm, dry, well-perfused. Neurologic: CN 2-12 grossly intact. Sensation intact. Moving all extremities.  Psychiatric: Alert and oriented to person, place, and  situation. Very pleasant and cooperative.    Labs and Imaging on Admission: I have personally reviewed following labs and imaging studies  CBC: Recent Labs  Lab 07/19/20 1345  WBC 7.9  NEUTROABS 6.9  HGB 11.1*  HCT 35.3*  MCV 91.7  PLT 167  Basic Metabolic Panel: Recent Labs  Lab 07/19/20 1345  NA 147*  K 3.5  CL 114*  CO2 21*  GLUCOSE 117*  BUN 15  CREATININE 0.98  CALCIUM 9.1   GFR: Estimated Creatinine Clearance: 104.4 mL/min (by C-G formula based on SCr of 0.98 mg/dL). Liver Function Tests: Recent Labs  Lab 07/19/20 1345  AST 21  ALT 14  ALKPHOS 77  BILITOT 1.0  PROT 6.9  ALBUMIN 3.7   Recent Labs  Lab 07/19/20 1345  LIPASE 32   No results for input(s): AMMONIA in the last 168 hours. Coagulation Profile: No results for input(s): INR, PROTIME in the last 168 hours. Cardiac Enzymes: No results for input(s): CKTOTAL, CKMB, CKMBINDEX, TROPONINI in the last 168 hours. BNP (last 3 results) No results for input(s): PROBNP in the last 8760 hours. HbA1C: No results for input(s): HGBA1C in the last 72 hours. CBG: No results for input(s): GLUCAP in the last 168 hours. Lipid Profile: No results for input(s): CHOL, HDL, LDLCALC, TRIG, CHOLHDL, LDLDIRECT in the last 72 hours. Thyroid Function Tests: No results for input(s): TSH, T4TOTAL, FREET4, T3FREE, THYROIDAB in the last 72 hours. Anemia Panel: No results for input(s): VITAMINB12, FOLATE, FERRITIN, TIBC, IRON, RETICCTPCT in the last 72 hours. Urine analysis:    Component Value Date/Time   COLORURINE AMBER (A) 03/14/2015 2118   APPEARANCEUR CLOUDY (A) 03/14/2015 2118   LABSPEC 1.015 04/23/2015 1247   PHURINE 5.0 04/23/2015 1247   PHURINE 5.5 03/14/2015 2118   GLUCOSEU Negative 04/23/2015 1247   HGBUR Negative 04/23/2015 1247   HGBUR MODERATE (A) 03/14/2015 2118   BILIRUBINUR Color Interference 04/23/2015 1247   KETONESUR Negative 04/23/2015 1247   KETONESUR NEGATIVE 03/14/2015 2118   PROTEINUR  Color Interference 04/23/2015 1247   PROTEINUR NEGATIVE 03/14/2015 2118   UROBILINOGEN Color Interference 04/23/2015 1247   NITRITE Color Interference 04/23/2015 1247   NITRITE NEGATIVE 03/14/2015 2118   LEUKOCYTESUR Color Interference 04/23/2015 1247   Sepsis Labs: @LABRCNTIP (procalcitonin:4,lacticidven:4) ) Recent Results (from the past 240 hour(s))  SARS CORONAVIRUS 2 (TAT 6-24 HRS) Nasopharyngeal Nasopharyngeal Swab     Status: None   Collection Time: 07/16/20 10:21 AM   Specimen: Nasopharyngeal Swab  Result Value Ref Range Status   SARS Coronavirus 2 NEGATIVE NEGATIVE Final    Comment: (NOTE) SARS-CoV-2 target nucleic acids are NOT DETECTED.  The SARS-CoV-2 RNA is generally detectable in upper and lower respiratory specimens during the acute phase of infection. Negative results do not preclude SARS-CoV-2 infection, do not rule out co-infections with other pathogens, and should not be used as the sole basis for treatment or other patient management decisions. Negative results must be combined with clinical observations, patient history, and epidemiological information. The expected result is Negative.  Fact Sheet for Patients: SugarRoll.be  Fact Sheet for Healthcare Providers: https://www.woods-mathews.com/  This test is not yet approved or cleared by the [place] FDA and  has been authorized for detection and/or diagnosis of SARS-CoV-2 by FDA under an Emergency Use Authorization (EUA). This EUA will remain  in effect (meaning this test can be used) for the duration of the COVID-19 declaration under Se ction 564(b)(1) of the Act, 21 U.S.C. section 360bbb-3(b)(1), unless the authorization is terminated or revoked sooner.  Performed at [facility], [place] [address] [place], [place] [address]   Resp Panel by RT-PCR (Flu A&B, Covid) Nasopharyngeal Swab     Status: None   Collection Time: 07/19/20  1:45 PM   Specimen:  Nasopharyngeal Swab; Nasopharyngeal(NP)  swabs in vial transport medium  Result Value Ref Range Status   SARS Coronavirus 2 by RT PCR NEGATIVE NEGATIVE Final    Comment: (NOTE) SARS-CoV-2 target nucleic acids are NOT DETECTED.  The SARS-CoV-2 RNA is generally detectable in upper respiratory specimens during the acute phase of infection. The lowest concentration of SARS-CoV-2 viral copies this assay can detect is 138 copies/mL. A negative result does not preclude SARS-Cov-2 infection and should not be used as the sole basis for treatment or other patient management decisions. A negative result may occur with  improper specimen collection/handling, submission of specimen other than nasopharyngeal swab, presence of viral mutation(s) within the areas targeted by this assay, and inadequate number of viral copies(<138 copies/mL). A negative result must be combined with clinical observations, patient history, and epidemiological information. The expected result is Negative.  Fact Sheet for Patients:  EntrepreneurPulse.com.au  Fact Sheet for Healthcare Providers:  IncredibleEmployment.be  This test is no t yet approved or cleared by the [place] FDA and  has been authorized for detection and/or diagnosis of SARS-CoV-2 by FDA under an Emergency Use Authorization (EUA). This EUA will remain  in effect (meaning this test can be used) for the duration of the COVID-19 declaration under Section 564(b)(1) of the Act, 21 U.S.C.section 360bbb-3(b)(1), unless the authorization is terminated  or revoked sooner.       Influenza A by PCR NEGATIVE NEGATIVE Final   Influenza B by PCR NEGATIVE NEGATIVE Final    Comment: (NOTE) The Xpert Xpress SARS-CoV-2/FLU/RSV plus assay is intended as an aid in the diagnosis of influenza from Nasopharyngeal swab specimens and should not be used as a sole basis for treatment. Nasal washings and aspirates are unacceptable for  Xpert Xpress SARS-CoV-2/FLU/RSV testing.  Fact Sheet for Patients: EntrepreneurPulse.com.au  Fact Sheet for Healthcare Providers: IncredibleEmployment.be  This test is not yet approved or cleared by the [place] FDA and has been authorized for detection and/or diagnosis of SARS-CoV-2 by FDA under an Emergency Use Authorization (EUA). This EUA will remain in effect (meaning this test can be used) for the duration of the COVID-19 declaration under Section 564(b)(1) of the Act, 21 U.S.C. section 360bbb-3(b)(1), unless the authorization is terminated or revoked.  Performed at [facility], [place] [address] [place], [place] [address]      Radiological Exams on Admission: CT Angio Chest PE W/Cm &/Or Wo Cm  Result Date: 07/19/2020 CLINICAL DATA:  Hemoptysis, shortness of breath, colonoscopy earlier today. EXAM: CT ANGIOGRAPHY CHEST WITH CONTRAST TECHNIQUE: Multidetector CT imaging of the chest was performed using the standard protocol during bolus administration of intravenous contrast. Multiplanar CT image reconstructions and MIPs were obtained to evaluate the vascular anatomy. CONTRAST:  19mL OMNIPAQUE IOHEXOL 350 MG/ML SOLN COMPARISON:  05/29/2018 FINDINGS: Cardiovascular: Limited because of injection pressure limitations from the port catheter and body habitus. No large central or proximal hilar acute filling defect or pulmonary embolus by CTA. Pulmonary arteries are normal in caliber. Limited assessment of the peripheral segmental and subsegmental branches for small emboli. Intact aorta. Pulsation artifact across the ascending aorta. No aneurysm or dissection. No mediastinal hemorrhage or hematoma. Normal heart size without pericardial effusion. Right IJ power port catheter tip in the right atrium. Mediastinum/Nodes: Thyroid unremarkable. Trachea and central airways are patent. Lower esophagus is fluid filled with an air-fluid level.  Small to moderate hiatal hernia noted. No bulky adenopathy. Lungs/Pleura: Patchy bilateral airspace process involving all lobes of both lungs but certainly more severe and consolidative  throughout the left lung compatible with infectious/inflammatory process such as pneumonia possibly related to aspiration. Other acute pneumonitis or acute pulmonary hemorrhage could have a similar appearance. No pleural abnormality, effusion or pneumothorax. Upper Abdomen: No acute abnormality. Musculoskeletal: Degenerative changes of the thoracic spine. Obese body habitus. No acute compression fracture. Intact sternum. Review of the MIP images confirms the above findings. IMPRESSION: Limited CTA but no significant central or proximal hilar large acute pulmonary embolus. Difficult to exclude small peripheral emboli. Asymmetric diffuse bilateral patchy airspace process, more consolidated throughout the left lung and most severe in the left lower lobe. Findings concerning for acute pneumonia possibly related to aspiration. Other acute inflammatory pneumonitis, or acute pulmonary hemorrhage could have a similar appearance. Dilated esophagus with an air-fluid level and associated hiatal hernia. Electronically Signed   By: Jerilynn Mages.  Shick M.D.   On: 07/19/2020 15:29   DG Chest Port 1 View  Result Date: 07/19/2020 CLINICAL DATA:  Shortness of breath, hemoptysis EXAM: PORTABLE CHEST 1 VIEW COMPARISON:  02/19/2019 FINDINGS: Right IJ approach chest port remains in place. Heart size within normal limits. Extensive airspace opacity within the left lower lobe. Right lung is clear. No appreciable pleural fluid collection. No pneumothorax. IMPRESSION: Extensive airspace opacity within the left lower lobe concerning for pneumonia. Radiographic follow-up to resolution recommended. Electronically Signed   By: Davina Poke D.O.   On: 07/19/2020 13:54    EKG: Independently reviewed. Sinus tachycardia, rate 126.   Assessment/Plan   1.  Pneumonia; acute hypoxic respiratory failure; scant hemoptysis  - Presents with cough, SOB, subjective fevers, and scant hemoptysis and is found to be saturating mid-80s to 90s on rm air, with tachypnea and multifocal airspace opacities on imaging  - Aspiration considered given vomiting, sedation during colonoscopy, radiographic appearance  - She was treated with Levaquin and supplemental O2 in ED  - Check sputum culture, strep pneumo and legionella antigens, trend procalcitonin, continue abx with Rocephin and azithromycin    2. Elevated troponin  - HS tropnin was 14 then 50 then 69 in ED  - She was having chest discomfort prior to arrival in setting of respiratory distress, now resolved  - No acute ischemic features noted on EKG  - ED PA discussed with cardiology who will plan to have patient seen in am at [facility]  - Possibly a type II NSTEMI in setting of acute respiratory distress  - Continue cardiac monitoring, trend troponin, treat underlying acute respiratory illness    3. Chronic diastolic CHF  - EF was preserved on echo from July 2021  - Given 2 liters IVF in ED  - Hold Lasix for now, monitor volume status    4. History of DVT and PE  - Had been taking Lovenox injections last few days leading up to colonoscopy this am and was planning to resume Xarelto tomorrow  - No evidence for acute DVT on no PE on limited CTA chest in ED  - Resume Xarelto tomorrow as planned barring any significant bleeding    5. Bronchospasm  - Continue albuterol as needed    6. Hypertension  - BP low-normal in ED, hold Norvasc for now    7. Type II DM  - A1c was 5.7% in 2019  - Check CBGs and use a low-intensity SSI for now    8. GERD; hiatal hernia  - Continue PPI    9. History of ovarian and endometrial cancer  - History of stage IIIC right ovarian carcinosarcoma and stage IA  endometrial cancer s/p TAH, BSO, cytoreduction, and adjuvant chemotherapy completed in 2017  - Reports she is in  remission, continues to follow with gyn onc   10. OSA  - Does not tolerate CPAP   11. BMI 61 - Has been losing wt with bariatric program, considering surgery     DVT prophylaxis: Lovenox pta, SCDs for now, resume Xarelto tomorrow if no significant bleeding  Code Status: Full  Level of Care: Level of care: Progressive Family Communication: None present  Disposition Plan:  Patient is from: Home  Anticipated d/c is to: Home  Anticipated d/c date is: 07/22/20 Patient currently: Pending improvement in respiratory status, further eval of elevated troponin  Consults called: ED PA discussed with cardiology  Admission status: Inpatient     Vianne Bulls, MD [facility]  07/19/2020, 10:50 PM

## 2020-07-20 ENCOUNTER — Encounter (HOSPITAL_COMMUNITY): Payer: Self-pay | Admitting: Gastroenterology

## 2020-07-20 DIAGNOSIS — C561 Malignant neoplasm of right ovary: Secondary | ICD-10-CM

## 2020-07-20 DIAGNOSIS — R778 Other specified abnormalities of plasma proteins: Secondary | ICD-10-CM

## 2020-07-20 DIAGNOSIS — J9601 Acute respiratory failure with hypoxia: Secondary | ICD-10-CM | POA: Diagnosis not present

## 2020-07-20 DIAGNOSIS — Z86711 Personal history of pulmonary embolism: Secondary | ICD-10-CM

## 2020-07-20 DIAGNOSIS — J9801 Acute bronchospasm: Secondary | ICD-10-CM | POA: Diagnosis not present

## 2020-07-20 DIAGNOSIS — J189 Pneumonia, unspecified organism: Secondary | ICD-10-CM | POA: Diagnosis not present

## 2020-07-20 DIAGNOSIS — I5032 Chronic diastolic (congestive) heart failure: Secondary | ICD-10-CM

## 2020-07-20 DIAGNOSIS — G4733 Obstructive sleep apnea (adult) (pediatric): Secondary | ICD-10-CM

## 2020-07-20 DIAGNOSIS — R042 Hemoptysis: Secondary | ICD-10-CM

## 2020-07-20 LAB — URINALYSIS, ROUTINE W REFLEX MICROSCOPIC
Bilirubin Urine: NEGATIVE
Glucose, UA: NEGATIVE mg/dL
Hgb urine dipstick: NEGATIVE
Ketones, ur: NEGATIVE mg/dL
Leukocytes,Ua: NEGATIVE
Nitrite: NEGATIVE
Protein, ur: NEGATIVE mg/dL
Specific Gravity, Urine: 1.008 (ref 1.005–1.030)
pH: 6 (ref 5.0–8.0)

## 2020-07-20 LAB — CBC
HCT: 30 % — ABNORMAL LOW (ref 36.0–46.0)
Hemoglobin: 9.5 g/dL — ABNORMAL LOW (ref 12.0–15.0)
MCH: 29.1 pg (ref 26.0–34.0)
MCHC: 31.7 g/dL (ref 30.0–36.0)
MCV: 92 fL (ref 80.0–100.0)
Platelets: 147 10*3/uL — ABNORMAL LOW (ref 150–400)
RBC: 3.26 MIL/uL — ABNORMAL LOW (ref 3.87–5.11)
RDW: 14.6 % (ref 11.5–15.5)
WBC: 12.3 10*3/uL — ABNORMAL HIGH (ref 4.0–10.5)
nRBC: 0 % (ref 0.0–0.2)

## 2020-07-20 LAB — EXPECTORATED SPUTUM ASSESSMENT W GRAM STAIN, RFLX TO RESP C

## 2020-07-20 LAB — SURGICAL PATHOLOGY

## 2020-07-20 LAB — GLUCOSE, CAPILLARY
Glucose-Capillary: 92 mg/dL (ref 70–99)
Glucose-Capillary: 101 mg/dL — ABNORMAL HIGH (ref 70–99)
Glucose-Capillary: 114 mg/dL — ABNORMAL HIGH (ref 70–99)
Glucose-Capillary: 107 mg/dL — ABNORMAL HIGH (ref 70–99)

## 2020-07-20 LAB — BASIC METABOLIC PANEL
Anion gap: 7 (ref 5–15)
BUN: 13 mg/dL (ref 6–20)
CO2: 21 mmol/L — ABNORMAL LOW (ref 22–32)
Calcium: 8.4 mg/dL — ABNORMAL LOW (ref 8.9–10.3)
Chloride: 112 mmol/L — ABNORMAL HIGH (ref 98–111)
Creatinine, Ser: 0.98 mg/dL (ref 0.44–1.00)
GFR, Estimated: >60 mL/min (ref >60)
Glucose, Bld: 96 mg/dL (ref 70–99)
Potassium: 3.5 mmol/L (ref 3.5–5.1)
Sodium: 140 mmol/L (ref 135–145)

## 2020-07-20 LAB — HEMOGLOBIN A1C
Hgb A1c MFr Bld: 5.5 % (ref 4.8–5.6)
Mean Plasma Glucose: 111.15 mg/dL

## 2020-07-20 LAB — HIV ANTIBODY (ROUTINE TESTING W REFLEX): HIV Screen 4th Generation wRfx: NONREACTIVE

## 2020-07-20 LAB — PROCALCITONIN: Procalcitonin: 6.55 ng/mL

## 2020-07-20 LAB — BRAIN NATRIURETIC PEPTIDE: B Natriuretic Peptide: 181.4 pg/mL — ABNORMAL HIGH (ref 0.0–100.0)

## 2020-07-20 MED ORDER — CHLORHEXIDINE GLUCONATE CLOTH 2 % EX PADS
6.0000 | MEDICATED_PAD | Freq: Every day | CUTANEOUS | Status: DC
  Administered 2020-07-20 – 2020-07-21 (×2): 6 via TOPICAL

## 2020-07-20 MED ORDER — FUROSEMIDE 10 MG/ML IJ SOLN
40.0000 mg | Freq: Every day | INTRAMUSCULAR | Status: DC
  Administered 2020-07-20 – 2020-07-21 (×2): 40 mg via INTRAVENOUS
  Filled 2020-07-20 (×2): qty 4

## 2020-07-20 MED ORDER — SODIUM CHLORIDE 0.9% FLUSH
10.0000 mL | INTRAVENOUS | Status: DC | PRN
  Administered 2020-07-21: 10 mL

## 2020-07-20 MED ORDER — ALUM & MAG HYDROXIDE-SIMETH 200-200-20 MG/5ML PO SUSP
30.0000 mL | ORAL | Status: DC | PRN
  Administered 2020-07-20: 30 mL via ORAL
  Filled 2020-07-20: qty 30

## 2020-07-20 MED ORDER — ORAL CARE MOUTH RINSE
15.0000 mL | Freq: Two times a day (BID) | OROMUCOSAL | Status: DC
  Administered 2020-07-20 – 2020-07-21 (×3): 15 mL via OROMUCOSAL

## 2020-07-20 MED ORDER — RIVAROXABAN 20 MG PO TABS
20.0000 mg | ORAL_TABLET | Freq: Every day | ORAL | Status: DC
  Administered 2020-07-20: 20 mg via ORAL
  Filled 2020-07-20: qty 1

## 2020-07-20 NOTE — Progress Notes (Signed)
PROGRESS NOTE    [name]  INO:[id number] DOB: 03-Nov-1965 DOA: 07/19/2020 PCP: Leeanne Rio, MD    Brief Narrative:  [name] is a 55 year old female with past medical history significant for ovarian/endometrial cancer s/p surgery and chemotherapy, essential hypertension, type 2 diabetes mellitus, history of DVT/PE on Xarelto, morbid obesity who presented to the emergency department with acute onset shortness of breath associated with cough, bloody sputum and chest discomfort.  Onset of symptoms following screening colonoscopy.  Patient reportedly was coughing up quite a bit during the procedure and following completion started feeling shortness of breath with worsening coughing on her way home.  Upon arrival home, patient started feeling discomfort in the central chest region and cough now productive of thick yellow sputum with streaks of blood.  In the ED, temperature 100.2 F, HR 124, RR 33, SPO2 86% on room air.  Sodium 147, potassium 3.5, chloride 114, CO2 21, glucose 117, BUN 15, creatinine 0.98, lipase 32, AST 21, ALT 14, total bilirubin 1.0.  High-sensitivity troponin 14>50.  WBC 7.9, hemoglobin 11.1, platelets 167.  Lactic acid 1.5, procalcitonin 6.25.  Chest x-ray with extensive airspace opacity within the left lower lobe concerning for pneumonia.  CT angiogram chest negative for PE but with asymmetric diffuse bilateral patchy airspace process more consolidated through the left lung and most severe in the left lower lobe consistent with pneumonia possibly related to aspiration.  EDP gave 1 dose of IV Levaquin.  Hospital service consulted for further evaluation and management for sepsis with acute hypoxic respiratory failure secondary to pneumonia.   Assessment & Plan:   Principal Problem:   Pneumonia Active Problems:   Ovarian carcinosarcoma, right ([place])   History of pulmonary embolism   Cough with hemoptysis   OSA (obstructive sleep apnea)   Acute  respiratory failure with hypoxia (HCC)   Chronic diastolic CHF (congestive heart failure) (HCC)   Bronchospasm   Elevated troponin   Sepsis, POA Acute hypoxic respiratory failure, POA Community-acquired versus aspiration pneumonia, POA Patient presenting to the ED with progressive shortness of breath associated with productive cough with yellow sputum with blood streaks.  Patient was found to be tachycardic, tachypneic, febrile and hypoxic on presentation.  Also noted dysregulated organ dysfunction with elevated troponin causing demand ischemia. Chest x-ray and CT angiogram findings notable for pneumonia.  Covid-19 PCR/influenza A/B PCR negative.  Suspect this is related to vomiting with aspiration episode following sedation during recent colonoscopy.  Received 2 L NS bolus in ED. --WBC 7.9>12.3 --PCT 6.25 --Sputum culture: Pending --Strep pneumo/Legionella antigens: Pending --Azithromycin 5 mg IV every 24 hours --Ceftriaxone 2 g IV every 24 hours --CBC daily --Trend procalcitonin --Supplemental oxygen, maintain SPO2 greater than 92% --Ambulatory O2 screening  Elevated troponin 2/2 demand ischemia in the setting of sepsis as above High-sensitivity troponin initially 14>50>69>65.  Suspect dysregulated organ dysfunction from demand ischemia in the setting of sepsis from pneumonia as above.  EKG with sinus tachycardia, rate 126 with no concerning ST elevation/depressions or T wave inversions.  TTE 10/11/2019 with LVEF 60-65%, no regional wall motion abnormalities, mild LVH, grade 1 diastolic dysfunction. --Cardiology following, appreciate assistance --Continue to monitor on telemetry --Continue treatment for sepsis as above --If recurrent symptoms of chest pressure, cardiology would consider outpatient Lexiscan stress testing  Chronic diastolic congestive heart failure, compensated No vascular congestion noted on chest x-ray or CT angiogram chest.  He mildly elevated at 181.  TTE 10/11/2019  with LVEF 60-65%, no regional wall motion abnormalities,  mild LVH, grade 1 diastolic dysfunction. --Furosemide 40 mg IV daily, plan to return to home dose on DC per cardiology  Hx DVT/PE: No evidence of PE on CT angiogram chest. --Continue Xarelto 20 mg p.o. daily  Essential hypertension --Holding home amlodipine for borderline hypotension on admission --Continue IV furosemide 40 mg daily --Monitor BP closely, restart amlodipine if blood pressures continue to improve  Type 2 diabetes mellitus Hemoglobin A1c 5.7 on 2019. --SSI for coverage --CBGs before every meal/at bedtime  GERD, hiatal hernia: Continue PPI  OSA: --States does not tolerate CPAP  Hx ovarian/endometrial carcinoma S/p total abdominal hysterectomy, BSO, cryo reduction and chemotherapy, completed 2017. --Continue outpatient follow-up with GYN/ONC  Morbid obesity Body mass index is 61.09 kg/m.  Discussed with patient needs for aggressive lifestyle changes/weight loss as this complicates all facets of care.  Patient reports losing weight with bariatric program, considering surgery.   DVT prophylaxis: Xarelto   Code Status: Full Code Family Communication: Updated patient extensively at bedside  Disposition Plan:  Level of care: Telemetry Status is: Inpatient  Remains inpatient appropriate because:Ongoing diagnostic testing needed not appropriate for outpatient work up, Unsafe d/c plan, IV treatments appropriate due to intensity of illness or inability to take PO and Inpatient level of care appropriate due to severity of illness   Dispo: The patient is from: Home              Anticipated d/c is to: Home              Patient currently is not medically stable to d/c.   Difficult to place patient No   Consultants:   Cardiology  Procedures:   None  Antimicrobials:   Levaquin 4/14 - 4/14  Azithromycin 4/14>>  Ceftriaxone 4/14>>   Subjective: Patient seen and examined at bedside, lying in bed.   Continues with significant dyspnea, slightly improved since initial presentation.  Has been titrated off of supplemental oxygen at rest.  Feels weak and fatigued.  Continues with productive cough.  No other questions or concerns at this time.  Denies headache, no visual changes, no current chest pain, no palpitations, no abdominal pain, no current fever/chills/night sweats, no nausea/vomiting/diarrhea, no congestion, no paresthesias.  No acute concerns overnight per nursing staff.  Objective: Vitals:   07/20/20 0013 07/20/20 0458 07/20/20 0501 07/20/20 0831  BP:   137/80 129/74  Pulse:   92 92  Resp:   18 18  Temp:   99.4 F (37.4 C) 99.5 F (37.5 C)  TempSrc:   Oral Oral  SpO2:   93% 100%  Weight: (!) 169.1 kg (!) 169.1 kg    Height: 5' 5.5" (1.664 m)       Intake/Output Summary (Last 24 hours) at 07/20/2020 1037 Last data filed at 07/20/2020 0900 Gross per 24 hour  Intake 3106.94 ml  Output 200 ml  Net 2906.94 ml   Filed Weights   07/20/20 0013 07/20/20 0458  Weight: (!) 169.1 kg (!) 169.1 kg    Examination:  General exam: Appears calm, slight be uncomfortable in appearance with noticeable dyspnea, obese Respiratory system: Decreased breath sounds left base with crackles, slightly increased respiratory effort with no accessory muscle use, oxygen now off at rest Cardiovascular system: S1 & S2 heard, RRR. No JVD, murmurs, rubs, gallops or clicks. No pedal edema. Gastrointestinal system: Abdomen is nondistended, soft and nontender. No organomegaly or masses felt. Normal bowel sounds heard. Central nervous system: Alert and oriented. No focal neurological deficits. Extremities: Symmetric 5  x 5 power. Skin: No rashes, lesions or ulcers Psychiatry: Judgement and insight appear normal. Mood & affect appropriate.     Data Reviewed: I have personally reviewed following labs and imaging studies  CBC: Recent Labs  Lab 07/19/20 1345 07/20/20 0605  WBC 7.9 12.3*  NEUTROABS 6.9   --   HGB 11.1* 9.5*  HCT 35.3* 30.0*  MCV 91.7 92.0  PLT 167 557*   Basic Metabolic Panel: Recent Labs  Lab 07/19/20 1345 07/20/20 0605  NA 147* 140  K 3.5 3.5  CL 114* 112*  CO2 21* 21*  GLUCOSE 117* 96  BUN 15 13  CREATININE 0.98 0.98  CALCIUM 9.1 8.4*   GFR: Estimated Creatinine Clearance: 105.1 mL/min (by C-G formula based on SCr of 0.98 mg/dL). Liver Function Tests: Recent Labs  Lab 07/19/20 1345  AST 21  ALT 14  ALKPHOS 77  BILITOT 1.0  PROT 6.9  ALBUMIN 3.7   Recent Labs  Lab 07/19/20 1345  LIPASE 32   No results for input(s): AMMONIA in the last 168 hours. Coagulation Profile: No results for input(s): INR, PROTIME in the last 168 hours. Cardiac Enzymes: No results for input(s): CKTOTAL, CKMB, CKMBINDEX, TROPONINI in the last 168 hours. BNP (last 3 results) No results for input(s): PROBNP in the last 8760 hours. HbA1C: Recent Labs    07/20/20 0605  HGBA1C 5.5   CBG: No results for input(s): GLUCAP in the last 168 hours. Lipid Profile: No results for input(s): CHOL, HDL, LDLCALC, TRIG, CHOLHDL, LDLDIRECT in the last 72 hours. Thyroid Function Tests: No results for input(s): TSH, T4TOTAL, FREET4, T3FREE, THYROIDAB in the last 72 hours. Anemia Panel: No results for input(s): VITAMINB12, FOLATE, FERRITIN, TIBC, IRON, RETICCTPCT in the last 72 hours. Sepsis Labs: Recent Labs  Lab 07/19/20 2253 07/20/20 0605  PROCALCITON 6.25 6.55  LATICACIDVEN 1.5  --     Recent Results (from the past 240 hour(s))  SARS CORONAVIRUS 2 (TAT 6-24 HRS) Nasopharyngeal Nasopharyngeal Swab     Status: None   Collection Time: 07/16/20 10:21 AM   Specimen: Nasopharyngeal Swab  Result Value Ref Range Status   SARS Coronavirus 2 NEGATIVE NEGATIVE Final    Comment: (NOTE) SARS-CoV-2 target nucleic acids are NOT DETECTED.  The SARS-CoV-2 RNA is generally detectable in upper and lower respiratory specimens during the acute phase of infection. Negative results do not  preclude SARS-CoV-2 infection, do not rule out co-infections with other pathogens, and should not be used as the sole basis for treatment or other patient management decisions. Negative results must be combined with clinical observations, patient history, and epidemiological information. The expected result is Negative.  Fact Sheet for Patients: SugarRoll.be  Fact Sheet for Healthcare Providers: https://www.woods-mathews.com/  This test is not yet approved or cleared by the [place] FDA and  has been authorized for detection and/or diagnosis of SARS-CoV-2 by FDA under an Emergency Use Authorization (EUA). This EUA will remain  in effect (meaning this test can be used) for the duration of the COVID-19 declaration under Se ction 564(b)(1) of the Act, 21 U.S.C. section 360bbb-3(b)(1), unless the authorization is terminated or revoked sooner.  Performed at [facility], [place] [address] [place], [place] [address]   Resp Panel by RT-PCR (Flu A&B, Covid) Nasopharyngeal Swab     Status: None   Collection Time: 07/19/20  1:45 PM   Specimen: Nasopharyngeal Swab; Nasopharyngeal(NP) swabs in vial transport medium  Result Value Ref Range Status   SARS Coronavirus 2 by  RT PCR NEGATIVE NEGATIVE Final    Comment: (NOTE) SARS-CoV-2 target nucleic acids are NOT DETECTED.  The SARS-CoV-2 RNA is generally detectable in upper respiratory specimens during the acute phase of infection. The lowest concentration of SARS-CoV-2 viral copies this assay can detect is 138 copies/mL. A negative result does not preclude SARS-Cov-2 infection and should not be used as the sole basis for treatment or other patient management decisions. A negative result may occur with  improper specimen collection/handling, submission of specimen other than nasopharyngeal swab, presence of viral mutation(s) within the areas targeted by this assay, and inadequate number of  viral copies(<138 copies/mL). A negative result must be combined with clinical observations, patient history, and epidemiological information. The expected result is Negative.  Fact Sheet for Patients:  EntrepreneurPulse.com.au  Fact Sheet for Healthcare Providers:  IncredibleEmployment.be  This test is no t yet approved or cleared by the [place] FDA and  has been authorized for detection and/or diagnosis of SARS-CoV-2 by FDA under an Emergency Use Authorization (EUA). This EUA will remain  in effect (meaning this test can be used) for the duration of the COVID-19 declaration under Section 564(b)(1) of the Act, 21 U.S.C.section 360bbb-3(b)(1), unless the authorization is terminated  or revoked sooner.       Influenza A by PCR NEGATIVE NEGATIVE Final   Influenza B by PCR NEGATIVE NEGATIVE Final    Comment: (NOTE) The Xpert Xpress SARS-CoV-2/FLU/RSV plus assay is intended as an aid in the diagnosis of influenza from Nasopharyngeal swab specimens and should not be used as a sole basis for treatment. Nasal washings and aspirates are unacceptable for Xpert Xpress SARS-CoV-2/FLU/RSV testing.  Fact Sheet for Patients: EntrepreneurPulse.com.au  Fact Sheet for Healthcare Providers: IncredibleEmployment.be  This test is not yet approved or cleared by the [place] FDA and has been authorized for detection and/or diagnosis of SARS-CoV-2 by FDA under an Emergency Use Authorization (EUA). This EUA will remain in effect (meaning this test can be used) for the duration of the COVID-19 declaration under Section 564(b)(1) of the Act, 21 U.S.C. section 360bbb-3(b)(1), unless the authorization is terminated or revoked.  Performed at [facility], [place] [address], [place], [place] [address]          Radiology Studies: CT Angio Chest PE W/Cm &/Or Wo Cm  Result Date: 07/19/2020 CLINICAL  DATA:  Hemoptysis, shortness of breath, colonoscopy earlier today. EXAM: CT ANGIOGRAPHY CHEST WITH CONTRAST TECHNIQUE: Multidetector CT imaging of the chest was performed using the standard protocol during bolus administration of intravenous contrast. Multiplanar CT image reconstructions and MIPs were obtained to evaluate the vascular anatomy. CONTRAST:  33mL OMNIPAQUE IOHEXOL 350 MG/ML SOLN COMPARISON:  05/29/2018 FINDINGS: Cardiovascular: Limited because of injection pressure limitations from the port catheter and body habitus. No large central or proximal hilar acute filling defect or pulmonary embolus by CTA. Pulmonary arteries are normal in caliber. Limited assessment of the peripheral segmental and subsegmental branches for small emboli. Intact aorta. Pulsation artifact across the ascending aorta. No aneurysm or dissection. No mediastinal hemorrhage or hematoma. Normal heart size without pericardial effusion. Right IJ power port catheter tip in the right atrium. Mediastinum/Nodes: Thyroid unremarkable. Trachea and central airways are patent. Lower esophagus is fluid filled with an air-fluid level. Small to moderate hiatal hernia noted. No bulky adenopathy. Lungs/Pleura: Patchy bilateral airspace process involving all lobes of both lungs but certainly more severe and consolidative throughout the left lung compatible with infectious/inflammatory process such as pneumonia possibly related to aspiration.  Other acute pneumonitis or acute pulmonary hemorrhage could have a similar appearance. No pleural abnormality, effusion or pneumothorax. Upper Abdomen: No acute abnormality. Musculoskeletal: Degenerative changes of the thoracic spine. Obese body habitus. No acute compression fracture. Intact sternum. Review of the MIP images confirms the above findings. IMPRESSION: Limited CTA but no significant central or proximal hilar large acute pulmonary embolus. Difficult to exclude small peripheral emboli. Asymmetric  diffuse bilateral patchy airspace process, more consolidated throughout the left lung and most severe in the left lower lobe. Findings concerning for acute pneumonia possibly related to aspiration. Other acute inflammatory pneumonitis, or acute pulmonary hemorrhage could have a similar appearance. Dilated esophagus with an air-fluid level and associated hiatal hernia. Electronically Signed   By: Jerilynn Mages.  Shick M.D.   On: 07/19/2020 15:29   DG Chest Port 1 View  Result Date: 07/19/2020 CLINICAL DATA:  Shortness of breath, hemoptysis EXAM: PORTABLE CHEST 1 VIEW COMPARISON:  02/19/2019 FINDINGS: Right IJ approach chest port remains in place. Heart size within normal limits. Extensive airspace opacity within the left lower lobe. Right lung is clear. No appreciable pleural fluid collection. No pneumothorax. IMPRESSION: Extensive airspace opacity within the left lower lobe concerning for pneumonia. Radiographic follow-up to resolution recommended. Electronically Signed   By: Davina Poke D.O.   On: 07/19/2020 13:54        Scheduled Meds: . Chlorhexidine Gluconate Cloth  6 each Topical Daily  . fesoterodine  8 mg Oral Daily  . furosemide  40 mg Intravenous Daily  . insulin aspart  0-9 Units Subcutaneous TID WC  . mouth rinse  15 mL Mouth Rinse BID  . pantoprazole  40 mg Oral Daily  . rivaroxaban  20 mg Oral Q supper   Continuous Infusions: . azithromycin    . cefTRIAXone (ROCEPHIN)  IV       LOS: 1 day    Time spent: 42 minutes spent on chart review, discussion with nursing staff, consultants, updating family and interview/physical exam; more than 50% of that time was spent in counseling and/or coordination of care.    Babe Anthis J [place] DO [facility] Available via Epic secure chat 7am-7pm After these hours, please refer to coverage provider listed on [email or web address] 07/20/2020, 10:37 AM

## 2020-07-20 NOTE — Plan of Care (Signed)
  Problem: Education: Goal: Knowledge of General Education information will improve Description: Including pain rating scale, medication(s)/side effects and non-pharmacologic comfort measures Outcome: Progressing   Problem: Activity: Goal: Risk for activity intolerance will decrease Outcome: Progressing   Problem: Coping: Goal: Level of anxiety will decrease Outcome: Progressing   Problem: Pain Managment: Goal: General experience of comfort will improve Outcome: Progressing   Problem: Safety: Goal: Ability to remain free from injury will improve Outcome: Progressing   Problem: Skin Integrity: Goal: Risk for impaired skin integrity will decrease Outcome: Progressing   Problem: Respiratory: Goal: Ability to maintain adequate ventilation will improve Outcome: Progressing Goal: Ability to maintain a clear airway will improve Outcome: Progressing   

## 2020-07-20 NOTE — Consult Note (Addendum)
Cardiology Consultation:   Patient ID: JAXON MYNHIER; [id number]; [birth year]   Admit date: 07/19/2020 Date of Consult: 07/20/2020  Primary Care Provider: Leeanne Rio, MD Primary Cardiologist: Dr. Margaretann Loveless, MD   Patient Profile:   [name] is a 55 y.o. female with a hx of morbid obesity, OSA on CPAP, DM2, endometrial and ovarian cancer s/p surgery/chemotherapy, HTN, HLD, and prior DVT/PE on chronic Xarelto who is being seen today for the evaluation of chest pain/elevated HsT at the request of Dr. Myna Hidalgo.   History of Present Illness:   Ms. Lapre is a 55yo F with a hx as stated above who presented to [facility] 07/20/20 with persistent cough, chest pressure and bloody sputum. She states that she underwent a colonoscopy screening on the day of hospital presentation. She was told that she was coughing quite a bit during the procedure and felt somewhat SOB on the way home. Once home, her SOB and cough became more pronounced. At that time, she began having centralized chest discomfort with blood tinged sputum production.   On ED arrival, oxygen saturations were found to be in the mid 80s-low 90's on RA. She was tachypneic and tachycardic with rates in the 120's. EKG showed ST with HR 126bpm and no acute changes. CTA performed out of concern for recurrent PE was negative however was difficult to exclude small peripheral emboli with asymmetric diffuse bilateral patchy airspace process with more consolidation throughout the left lung and most severe in the left lower lobe concerning for acute pneumonia possibly related to aspiration. HsT initially was 14>>50>>69>>65, likley secondary to aspiration PNA and acute hypoxic distress. She was started on empiric antibiotics and was admitted to hospitalist team with cardiology consultation due to delta rise in trop. She reports no recurrent chest pressure since hospital admission.  Her breathing is improved.  She has no prior history of CAD.   Does report a history of MI in her brother however he has complex medical history including ESRD x11 years.  No prior mention of aortic or coronary calcifications on prior chest imaging.  She was initially seen by Dr. Margaretann Loveless 09/2019 for CV preoperative clearance prior to bariatric surgery. An echocardiogram was obtained due to her hx of chemotherapy, obstructive sleep apnea, and history of recurrent PE. This showed normal LVEF at 60-65% with mild LVH, G1DD and no evidence of valvular disease. She was ultimately cleared for surgery wcardiovascular testing. She has a hx of PE in 2017 and again in 2018 and has been treated with chronic anticoagulation with Xarelto.  She has been seen several more times, mostly to follow her hypertension and hyperlipidemia.   Past Medical History:  Diagnosis Date  . Allergy   . Anemia   . Anxiety   . Arthritis   . Blood transfusion without reported diagnosis   . Cough   . Cough 06/11/2018  . Difficulty sleeping 06/11/2018  . Diverticulitis with perforation 2010  . DVT (deep vein thrombosis) in pregnancy 11/24/2016  . DVT, lower extremity ([place])   . Endometrial cancer ([place])   . Enlarged lymph nodes 08/13/2017  . Family history of adverse reaction to anesthesia    pts daughter had difficulty awakening following anesthesia, long time to wake up  . GERD (gastroesophageal reflux disease)   . Headache   . Hemoptysis 04/22/2018  . History of bronchitis   . History of ear infections   . Hospital discharge follow-up 03/02/2019  . Hx of migraines   . Hyperlipidemia   .  Hypertension   . IBS (irritable bowel syndrome)   . Kidney infection   . Lupus (systemic lupus erythematosus) ([place]) 02/2017  . Morbid obesity ([place])   . Neck pain 01/13/2019  . Ovarian cancer ([place]) dx'd 01/2015  . PONV (postoperative nausea and vomiting)   . Port-A-Cath in place    Right side  . Portacath in place 09/12/2015  . Pre-diabetes   . Pyelonephritis   . Right knee pain 11/28/2011  .  Scleroderma ([place])   . Screen for colon cancer 03/02/2019  . Slow rate of speech 08/22/2015   Chronic from CVA.  She also has loss of left nasolabial fold and slight left facial droop/small asymmetry on the left side secondary to CVA  . UTI (urinary tract infection) 05/07/2018    Past Surgical History:  Procedure Laterality Date  . ABDOMINAL HYSTERECTOMY N/A 03/13/2015   Procedure: TOTAL HYSTERECTOMY ABDOMINAL BILATERAL SALPINGO OOPHORECTOMY RADICAL TUMOR [place];  Surgeon: Everitt Amber, MD;  Location: [facility];  Service: Gynecology;  Laterality: N/A;  . BOWEL RESECTION  03/13/2015   Procedure: SMALL BOWEL RESECTION;  Surgeon: Everitt Amber, MD;  Location: [facility];  Service: Gynecology;;  . [place] and 1984  . COLOSTOMY  2008  . COLOSTOMY TAKEDOWN    . ESOPHAGOGASTRODUODENOSCOPY (EGD) WITH PROPOFOL N/A 12/21/2018   Procedure: ESOPHAGOGASTRODUODENOSCOPY (EGD) WITH PROPOFOL;  Surgeon: Doran Stabler, MD;  Location: [facility];  Service: Gastroenterology;  Laterality: N/A;  . LAPAROTOMY N/A 03/13/2015   Procedure: EXPLORATORY LAPAROTOMY;  Surgeon: Everitt Amber, MD;  Location: [facility];  Service: Gynecology;  Laterality: N/A;  . TONSILLECTOMY AND ADENOIDECTOMY  1997  . VENTRAL HERNIA REPAIR  03/13/2015   Procedure: HERNIA REPAIR VENTRAL ADULT;  Surgeon: Everitt Amber, MD;  Location: [facility];  Service: Gynecology;;     Prior to Admission medications   Medication Sig Start Date End Date Taking? Authorizing Provider  acetaminophen (TYLENOL) 500 MG tablet Take 1,000 mg by mouth every 6 (six) hours as needed for moderate pain.   Yes [provider]  albuterol (PROVENTIL) (2.5 MG/3ML) 0.083% nebulizer solution Take 3 mLs (2.5 mg total) by nebulization every 4 (four) hours as needed for wheezing or shortness of breath. 05/21/20  Yes Leeanne Rio, MD  albuterol (VENTOLIN HFA) 108 (90 Base) MCG/ACT inhaler Inhale 2 puffs into the lungs every 6 (six) hours as needed for wheezing or  shortness of breath. Please instruct patient in usage. 12/19/18  Yes Leeanne Rio, MD  amLODipine (NORVASC) 5 MG tablet Take 5 mg by mouth daily.   Yes [provider]  Cholecalciferol (VITAMIN D) 50 MCG (2000 UT) CAPS Take 8,000 Units by mouth daily.   Yes [provider]  diclofenac Sodium (VOLTAREN) 1 % GEL Apply to knee once daily as needed Patient taking differently: Apply 1 application topically daily as needed (knee pain). Apply to knee once daily as needed 08/24/19  Yes Leeanne Rio, MD  furosemide (LASIX) 20 MG tablet Take 20 mg by mouth daily as needed for edema.   Yes [provider]  gabapentin (NEURONTIN) 100 MG capsule Take 1 capsule (100 mg total) by mouth daily as needed (neuropathy). 02/01/19  Yes Leeanne Rio, MD  levofloxacin (LEVAQUIN) 750 MG tablet Take 1 tablet (750 mg total) by mouth daily for 5 days. 07/19/20 07/24/20 Yes Patel, Deberah Pelton, PA-C  lidocaine-prilocaine (EMLA) cream Apply 1 application topically as needed (port access).   Yes [provider]  liraglutide (VICTOZA) 18  MG/3ML SOPN Inject 18 mg into the skin daily.   Yes [provider]  omeprazole (PRILOSEC) 40 MG capsule Take 1 capsule (40 mg total) by mouth daily. 07/06/20  Yes Danis, Kirke Corin, MD  TOVIAZ 8 MG TB24 tablet TAKE 1 TABLET(8 MG) BY MOUTH DAILY Patient taking differently: Take 8 mg by mouth daily. 04/03/20  Yes Leeanne Rio, MD  triamcinolone (KENALOG) 0.1 % APPLY TO THE AFFECTED AREA TWICE DAILY AS NEEDED FOR SKIN IRRITATION Patient taking differently: Apply 1 application topically 2 (two) times daily as needed (irritation). 06/12/20  Yes Leeanne Rio, MD  valACYclovir (VALTREX) 500 MG tablet Take 1 tablet (500 mg total) by mouth 2 (two) times daily. For 3 days at start of outbreak Patient taking differently: Take 500 mg by mouth 2 (two) times daily as needed (outbreak). For 3 days at start of outbreak 03/21/20  Yes Leeanne Rio, MD  B-D UF III MINI PEN NEEDLES 31G X 5 MM MISC  07/28/19   [provider]  Insulin Pen Needle 31G X 5 MM MISC Use as directed with Victoza 09/16/19   [provider]    Inpatient Medications: Scheduled Meds: . Chlorhexidine Gluconate Cloth  6 each Topical Daily  . fesoterodine  8 mg Oral Daily  . insulin aspart  0-9 Units Subcutaneous TID WC  . mouth rinse  15 mL Mouth Rinse BID  . pantoprazole  40 mg Oral Daily   Continuous Infusions: . azithromycin    . cefTRIAXone (ROCEPHIN)  IV     PRN Meds: acetaminophen **OR** acetaminophen, albuterol, diclofenac Sodium, gabapentin, HYDROcodone-acetaminophen, ondansetron **OR** ondansetron (ZOFRAN) IV, sodium chloride flush  Allergies:    Allergies  Allergen Reactions  . Penicillins Rash    Has patient had a PCN reaction causing immediate rash, facial/tongue/throat swelling, SOB or lightheadedness with hypotension: no Has patient had a PCN reaction causing severe rash involving mucus membranes or skin necrosis: no Has patient had a PCN reaction that required hospitalization in the hospital at the time Has patient had a PCN reaction occurring within the last 10 years: no If all of the above answers are "NO", then may proceed with Cephalosporin use.     Social History:   Social History   Socioeconomic History  . Marital status: Legally Separated    Spouse name: Not on file  . Number of children: Not on file  . Years of education: Not on file  . Highest education level: Not on file  Occupational History  . Not on file  Tobacco Use  . Smoking status: Never Smoker  . Smokeless tobacco: Never Used  Vaping Use  . Vaping Use: Never used  Substance and Sexual Activity  . Alcohol use: Yes    Comment: Maybe wine every 6 months  . Drug use: No  . Sexual activity: Yes    Partners: Male    Comment: Told she could not have more children in 1986  Other Topics Concern  . 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   Married x 23 years, recently separated (4/15).  No domestic violence.  + financial stress.     Lives previously with husband who moved out, now with daughter who has medical problems, including Hodgkin's disease, and granddaughter (age 36).     Uses seat belt.  Social Determinants of Health   Financial Resource Strain: Not on file  Food Insecurity: Not on file  Transportation Needs: Not on file  Physical Activity: Not on file  Stress: Not on file  Social Connections: Not on file  Intimate Partner Violence: Not on file    Family History:   Family History  Problem Relation Age of Onset  . Diabetes Mother   . Hypertension Mother   . Hyperlipidemia Mother   . Colon polyps Mother        4 total  . Fibroids Mother        s/p hysterectomy  . Hyperlipidemia Father   . Diabetes Daughter   . Hodgkin's lymphoma Daughter 33  . Asthma Maternal Aunt        severe - d. 25  . Prostate cancer Maternal Uncle 62  . Diabetes Paternal Aunt   . Diabetes Paternal Uncle   . Kidney failure Maternal Grandmother 84  . Pancreatic cancer Paternal Grandmother        dx. >50  . Diabetes Paternal Grandfather   . Diabetes Paternal Aunt   . Heart disease Neg Hx   . Stroke Neg Hx   . Colon cancer Neg Hx   . Stomach cancer Neg Hx   . Esophageal cancer Neg Hx   . Rectal cancer Neg Hx    Family Status:  Family Status  Relation Name Status  . Mother  Alive  . Father  Alive  . Daughter  Alive  . Sister  Alive       (x2)  . Brother  Comptroller       (x2)  . Son  Alive  . [employer]  . Mat Aunt  Deceased at age 39       bad asthma  . Mat Nordstrom  . Ethlyn Daniels  Deceased at age 32       diabetes  . Annamarie Major  Deceased at age later ages        (x3)  . MGM  Deceased at age 15       dialysis - kidney failure  . MGF  Deceased at age when mother was 56 yrs old       lim  info  . PGM  Deceased at age older than 64       pancreatic cancer  . PGF  Deceased at age younger than 43       DM  . Mat Aunt  Alive       (x2)  . Ethlyn Daniels  Deceased at age later ages       (x2)  . [employer]  Alive  . Neg Hx  (Not Specified)    ROS:  Please see the history of present illness.  All other ROS reviewed and negative.     Physical Exam/Data:   Vitals:   07/20/20 0003 07/20/20 0013 07/20/20 0458 07/20/20 0501  BP: (!) 144/80   137/80  Pulse: 93   92  Resp: 18   18  Temp: 99.2 F (37.3 C)   99.4 F (37.4 C)  TempSrc: Oral   Oral  SpO2: 97%   93%  Weight:  (!) 169.1 kg (!) 169.1 kg   Height:  5' 5.5" (1.664 m)      Intake/Output Summary (Last 24 hours) at 07/20/2020 0706 Last data filed at 07/20/2020 0100 Gross per 24 hour  Intake 2506.94 ml  Output --  Net 2506.94 ml  Filed Weights   07/20/20 0013 07/20/20 0458  Weight: (!) 169.1 kg (!) 169.1 kg   Body mass index is 61.09 kg/m.   General: Obese, NAD Skin: Warm, dry, intact  Neck: Negative for carotid bruits. No JVD Lungs: Left greater than right lower lobe crackles. Breathing is unlabored. Cardiovascular: RRR with S1 S2. No murmurs Abdomen: Soft, non-tender, non-distended. No obvious abdominal masses. Extremities: Mild 1+ edema.  Radial pulses 2+ bilaterally Neuro: Alert and oriented. No focal deficits. No facial asymmetry. MAE spontaneously. Psych: Responds to questions appropriately with normal affect.     EKG:  The EKG was personally reviewed and demonstrates: 07/19/2020 sinus tachycardia, HR 126 bpm with no acute ST segment changes Telemetry:  Telemetry was personally reviewed and demonstrates: 07/20/2020 NSR with HR in the 90s  Relevant CV Studies:  Echocardiogram 10/11/2019:  1. Left ventricular ejection fraction, by estimation, is 60 to 65%. The  left ventricle has normal function. The left ventricle has no regional  wall motion abnormalities. There is mild left ventricular  hypertrophy.  Left ventricular diastolic parameters  are consistent with Grade I diastolic dysfunction (impaired relaxation).  2. Right ventricular systolic function is normal. The right ventricular  size is normal.  3. The mitral valve is grossly normal. No evidence of mitral valve  regurgitation.  4. The aortic valve is tricuspid. Aortic valve regurgitation is not  visualized.  5. The inferior vena cava is normal in size with greater than 50%  respiratory variability, suggesting right atrial pressure of 3 mmHg.   Laboratory Data:  Chemistry Recent Labs  Lab 07/19/20 1345 07/20/20 0605  NA 147* 140  K 3.5 3.5  CL 114* 112*  CO2 21* 21*  GLUCOSE 117* 96  BUN 15 13  CREATININE 0.98 0.98  CALCIUM 9.1 8.4*  GFRNONAA >60 >60  ANIONGAP 12 7    Total Protein  Date Value Ref Range Status  07/19/2020 6.9 6.5 - 8.1 g/dL Final  02/02/2019 6.6 6.0 - 8.5 g/dL Final  01/17/2016 6.8 6.4 - 8.3 g/dL Final   Albumin  Date Value Ref Range Status  07/19/2020 3.7 3.5 - 5.0 g/dL Final  02/02/2019 4.3 3.8 - 4.9 g/dL Final  01/17/2016 3.5 3.5 - 5.0 g/dL Final   AST  Date Value Ref Range Status  07/19/2020 21 15 - 41 U/L Final  01/17/2016 12 5 - 34 U/L Final   ALT  Date Value Ref Range Status  07/19/2020 14 0 - 44 U/L Final  01/17/2016 9 0 - 55 U/L Final   Alkaline Phosphatase  Date Value Ref Range Status  07/19/2020 77 38 - 126 U/L Final  01/17/2016 83 40 - 150 U/L Final   Total Bilirubin  Date Value Ref Range Status  07/19/2020 1.0 0.3 - 1.2 mg/dL Final  01/17/2016 0.33 0.20 - 1.20 mg/dL Final   Bilirubin Total  Date Value Ref Range Status  02/02/2019 <0.2 0.0 - 1.2 mg/dL Final   Hematology Recent Labs  Lab 07/19/20 1345 07/20/20 0605  WBC 7.9 12.3*  RBC 3.85* 3.26*  HGB 11.1* 9.5*  HCT 35.3* 30.0*  MCV 91.7 92.0  MCH 28.8 29.1  MCHC 31.4 31.7  RDW 14.6 14.6  PLT 167 147*   Cardiac EnzymesNo results for input(s): TROPONINI in the last 168 hours. No  results for input(s): TROPIPOC in the last 168 hours.  BNP Recent Labs  Lab 07/19/20 2253  BNP 181.4*    DDimer No results for input(s): DDIMER in the last 168 hours.  TSH:  Lab Results  Component Value Date   TSH 2.690 10/23/2017   Lipids: Lab Results  Component Value Date   CHOL 241 (H) 01/01/2018   HDL 54 01/01/2018   LDLCALC 168 (H) 01/01/2018   LDLDIRECT 164 (H) 07/26/2013   TRIG 64 02/19/2019   CHOLHDL 4.5 01/01/2018   HgbA1c: Lab Results  Component Value Date   HGBA1C 5.7 (H) 01/01/2018    Radiology/Studies:  CT Angio Chest PE W/Cm &/Or Wo Cm  Result Date: 07/19/2020 CLINICAL DATA:  Hemoptysis, shortness of breath, colonoscopy earlier today. EXAM: CT ANGIOGRAPHY CHEST WITH CONTRAST TECHNIQUE: Multidetector CT imaging of the chest was performed using the standard protocol during bolus administration of intravenous contrast. Multiplanar CT image reconstructions and MIPs were obtained to evaluate the vascular anatomy. CONTRAST:  28mL OMNIPAQUE IOHEXOL 350 MG/ML SOLN COMPARISON:  05/29/2018 FINDINGS: Cardiovascular: Limited because of injection pressure limitations from the port catheter and body habitus. No large central or proximal hilar acute filling defect or pulmonary embolus by CTA. Pulmonary arteries are normal in caliber. Limited assessment of the peripheral segmental and subsegmental branches for small emboli. Intact aorta. Pulsation artifact across the ascending aorta. No aneurysm or dissection. No mediastinal hemorrhage or hematoma. Normal heart size without pericardial effusion. Right IJ power port catheter tip in the right atrium. Mediastinum/Nodes: Thyroid unremarkable. Trachea and central airways are patent. Lower esophagus is fluid filled with an air-fluid level. Small to moderate hiatal hernia noted. No bulky adenopathy. Lungs/Pleura: Patchy bilateral airspace process involving all lobes of both lungs but certainly more severe and consolidative throughout the left  lung compatible with infectious/inflammatory process such as pneumonia possibly related to aspiration. Other acute pneumonitis or acute pulmonary hemorrhage could have a similar appearance. No pleural abnormality, effusion or pneumothorax. Upper Abdomen: No acute abnormality. Musculoskeletal: Degenerative changes of the thoracic spine. Obese body habitus. No acute compression fracture. Intact sternum. Review of the MIP images confirms the above findings. IMPRESSION: Limited CTA but no significant central or proximal hilar large acute pulmonary embolus. Difficult to exclude small peripheral emboli. Asymmetric diffuse bilateral patchy airspace process, more consolidated throughout the left lung and most severe in the left lower lobe. Findings concerning for acute pneumonia possibly related to aspiration. Other acute inflammatory pneumonitis, or acute pulmonary hemorrhage could have a similar appearance. Dilated esophagus with an air-fluid level and associated hiatal hernia. Electronically Signed   By: Jerilynn Mages.  Shick M.D.   On: 07/19/2020 15:29   DG Chest Port 1 View  Result Date: 07/19/2020 CLINICAL DATA:  Shortness of breath, hemoptysis EXAM: PORTABLE CHEST 1 VIEW COMPARISON:  02/19/2019 FINDINGS: Right IJ approach chest port remains in place. Heart size within normal limits. Extensive airspace opacity within the left lower lobe. Right lung is clear. No appreciable pleural fluid collection. No pneumothorax. IMPRESSION: Extensive airspace opacity within the left lower lobe concerning for pneumonia. Radiographic follow-up to resolution recommended. Electronically Signed   By: Davina Poke D.O.   On: 07/19/2020 13:54    Assessment and Plan:   1. Elevated HsT with no prior hx of CAD: -Patient [facility] 07/20/20 with persistent cough, chest pressure and bloody sputum. She underwent a colonoscopy screening on the day of hospital presentation and was told that she was coughing quite a bit during the  procedure>> she felt somewhat SOB on the way home which became more pronounced once she was at home. At that time, she began having centralized chest discomfort with blood tinged sputum production.  -On ED arrival,  oxygen saturations were found to be in the mid 80s-low 90's on RA. EKG showed ST with HR 126bpm and no acute changes. -CTA performed out of concern for recurrent PE was negative however was difficult to exclude small peripheral emboli with asymmetric diffuse bilateral patchy airspace process with more consolidation throughout the left lung and most severe in the left lower lobe concerning for acute pneumonia possibly related to aspiration.  -HsT initially was 14>>50>>69>>65, likley secondary to aspiration PNA and acute hypoxic distress.  -Started on empiric antibiotics and was admitted to hospitalist team with cardiology consultation due to delta rise in trop.  -She reports no recurrent chest pressure since hospital admission with no prior history of CAD and no prior mention of aortic or coronary calcifications on prior chest imaging. -If recurrent symptoms chest pressure, could consider outpatient Lexiscan stress testing however for now would focus on management of PNA.  2. Presumed aspiration PNA/hypoxic respiratory failure: -Pt presented with acute onset SOB>> found to have small peripheral emboli with asymmetric diffuse bilateral patchy airspace process with more consolidation throughout the left lung and most severe in the left lower lobe concerning for acute pneumonia possibly related to aspiration on chest CT imaging -Has been started on empiric antibiotic therapy per primary team -WBC up to 12.3 today -Continues to be mildly febrile at 99.4>> peak temp 100.2  3. Chronic diastolic CHF: -Recent echocardiogram performed 10/2019 for preoperative clearance found to have normal LVEF at 60-65% with G1DD and no valvular diease.  -She received 2 L IVF on ED arrival -BNP mildly elevated at  181 -Would start IV Lasix 40mg  QD>>and resume home dosing at d/c (PRN)  4. Hx of DVT/PE: -Pt had been transitioned to Lovenox due to colonoscopy performed 07/19/20 with plans to resume Xarelto today, post procedure -No evidence of PE per limited CTA  -Continue with SCDs until Xarelto resumed  5. HTN: -Stable, 137/80, 144/80, 115/50 -Was noted to rather hypotensive on ED arrival>>recieved 2L IVF -Resume amlodipine if BO remains stable from diuresis   6. Hx of ovarian and endometrial stage carcinoma: -s/p total abdominal hysterectomy, BSO, cyroreduction and chemotherapy>>completed 2017 -Follows with ONC GYN   For questions or updates, please contact [facility] Please consult [email or web address] for contact info under Cardiology/STEMI.   Lyndel Safe NP-C [facility] Pager: [phone] 07/20/2020 7:06 AM  Personally seen and examined. Agree with above.  Overall feeling better.  Breathing has improved.  GEN: Well nourished, well developed, in no acute distress  HEENT: normal  Neck: no JVD, carotid bruits, or masses Cardiac: RRR; no murmurs, rubs, or gallops,no edema  Respiratory: Mild crackles heard GI: soft, nontender, nondistended, + BS MS: no deformity or atrophy  Skin: warm and dry, no rash Neuro:  Alert and Oriented x 3, Strength and sensation are intact Psych: euthymic mood, full affect Port-A-Cath in place  EKG originally showed sinus tachycardia 126.  Telemetry now heart rate in the 90s.  Echo 2021 EF 60 to 65% with grade 1 diastolic dysfunction.  Elevated troponin demand ischemia in the setting of aspiration pneumonia with chronic diastolic heart failure with history of PE DVT with ovarian endometrial cancer -HsT initially was 14>>50>>69>>65 - Agree that this is most likely demand ischemia in the setting of aspiration pneumonia and acute hypoxic distress.  No prior history of CAD.  No coronary calcifications noted on CT. - Agree that if symptoms of chest  pressure were to occur, can consider pharmacologic stress testing as outpatient. -Normal ejection fraction. - Utilizing  IV Lasix 40 mg a day.  Restart home dose at discharge.  Was on as needed. -Thankfully no evidence of PE on CT.  Continue with Xarelto when able.  Candee Furbish, MD

## 2020-07-21 DIAGNOSIS — J189 Pneumonia, unspecified organism: Secondary | ICD-10-CM

## 2020-07-21 LAB — CBC
HCT: 29 % — ABNORMAL LOW (ref 36.0–46.0)
Hemoglobin: 9.3 g/dL — ABNORMAL LOW (ref 12.0–15.0)
MCH: 29.4 pg (ref 26.0–34.0)
MCHC: 32.1 g/dL (ref 30.0–36.0)
MCV: 91.8 fL (ref 80.0–100.0)
Platelets: 154 10*3/uL (ref 150–400)
RBC: 3.16 MIL/uL — ABNORMAL LOW (ref 3.87–5.11)
RDW: 14.4 % (ref 11.5–15.5)
WBC: 10.8 10*3/uL — ABNORMAL HIGH (ref 4.0–10.5)
nRBC: 0 % (ref 0.0–0.2)

## 2020-07-21 LAB — BASIC METABOLIC PANEL
Anion gap: 8 (ref 5–15)
BUN: 14 mg/dL (ref 6–20)
CO2: 24 mmol/L (ref 22–32)
Calcium: 8.5 mg/dL — ABNORMAL LOW (ref 8.9–10.3)
Chloride: 109 mmol/L (ref 98–111)
Creatinine, Ser: 1.23 mg/dL — ABNORMAL HIGH (ref 0.44–1.00)
GFR, Estimated: 52 mL/min — ABNORMAL LOW (ref >60)
Glucose, Bld: 99 mg/dL (ref 70–99)
Potassium: 3.2 mmol/L — ABNORMAL LOW (ref 3.5–5.1)
Sodium: 141 mmol/L (ref 135–145)

## 2020-07-21 LAB — GLUCOSE, CAPILLARY
Glucose-Capillary: 95 mg/dL (ref 70–99)
Glucose-Capillary: 96 mg/dL (ref 70–99)

## 2020-07-21 LAB — PROCALCITONIN: Procalcitonin: 5.23 ng/mL

## 2020-07-21 MED ORDER — RIVAROXABAN 20 MG PO TABS: 20.0000 mg | ORAL_TABLET | Freq: Every day | ORAL | Status: DC

## 2020-07-21 MED ORDER — HEPARIN SOD (PORK) LOCK FLUSH 100 UNIT/ML IV SOLN
500.0000 [IU] | INTRAVENOUS | Status: AC | PRN
  Administered 2020-07-21: 500 [IU]

## 2020-07-21 MED ORDER — LEVOFLOXACIN 750 MG PO TABS: 750.0000 mg | ORAL_TABLET | Freq: Every day | ORAL | 0 refills | Status: AC

## 2020-07-21 MED ORDER — AZITHROMYCIN 500 MG PO TABS: 500.0000 mg | ORAL_TABLET | Freq: Every day | ORAL | 0 refills | Status: DC

## 2020-07-21 MED ORDER — LEVOFLOXACIN 750 MG PO TABS: 750.0000 mg | ORAL_TABLET | Freq: Every day | ORAL | 0 refills | Status: DC

## 2020-07-21 MED ORDER — GUAIFENESIN ER 600 MG PO TB12: 600.0000 mg | ORAL_TABLET | Freq: Two times a day (BID) | ORAL | Status: DC | PRN

## 2020-07-21 MED ORDER — POTASSIUM CHLORIDE CRYS ER 20 MEQ PO TBCR
40.0000 meq | EXTENDED_RELEASE_TABLET | Freq: Once | ORAL | Status: AC
  Administered 2020-07-21: 40 meq via ORAL
  Filled 2020-07-21: qty 2

## 2020-07-21 MED ORDER — LIRAGLUTIDE 18 MG/3ML ~~LOC~~ SOPN: 1.8000 mg | PEN_INJECTOR | Freq: Every day | SUBCUTANEOUS | Status: DC

## 2020-07-21 NOTE — Progress Notes (Signed)
I have sent a message to our office's scheduling team requesting a follow-up appointment, and our office will call the patient with this information.   

## 2020-07-21 NOTE — Plan of Care (Signed)
Discharge instructions reviewed with patient, questions answered verbalized understanding.  Patient also spoke with pharmacist regarding antibiotics that she would be taking at home.  Patient transported via wheelchair to main entrance to be taken home by husband.

## 2020-07-21 NOTE — Progress Notes (Addendum)
   Dr Kingsley Plan consult note reviewed. Mild trop elevation in setting of pneumonia and initial hypoxia. EKG without acute ischemic changes. No plans for ischemic testing, if persistent symptoms as outpatient could consider then. We will sign off inpatient care and arrange outpatient f/u   Carlyle Dolly MD  For questions or updates, please contact [facility] Please consult [email or web address] for contact info under        Signed, Carlyle Dolly, MD  07/21/2020, 7:28 AM

## 2020-07-21 NOTE — Discharge Summary (Addendum)
Physician Discharge Summary  [name]  XBM:[id number]  DOB: [birth year]  DOA: 07/19/2020 PCP: Alison Rio, MD  Admit date: 07/19/2020 Discharge date: 07/21/2020  Admitted From: Home Disposition: Home  Recommendations for Outpatient Follow-up:  1. Follow up with PCP in 1-2 weeks 2. Please obtain BMP/CBC in one week 3. Please follow up on the following pending results: Sputum cultures strep/Legionella antibody 4. Cardiology follow-up   Discharge Condition: Stable CODE STATUS: Full code Diet recommendation: Heart Healthy / Carb Modified  Brief/Interim Summary: For full details see H&P/Progress note, but in brief, [name] is a 55 year old female with past medical history significant for ovarian/endometrial cancer s/p surgery and chemotherapy, essential hypertension, type 2 diabetes mellitus, history of DVT/PE on Xarelto, morbid obesity who presented to the emergency department with acute onset shortness of breath associated with cough, bloody sputum and chest discomfort.  Onset of symptoms following screening colonoscopy.   In the ED, temperature 100.2 F, HR 124, RR 33, SPO2 86% on room air.  Sodium 147, potassium 3.5, chloride 114, CO2 21, glucose 117, BUN 15, creatinine 0.98, lipase 32, AST 21, ALT 14, total bilirubin 1.0.  High-sensitivity troponin 14>50.  WBC 7.9, hemoglobin 11.1, platelets 167.  Lactic acid 1.5, procalcitonin 6.25.  Chest x-ray with extensive airspace opacity within the left lower lobe concerning for pneumonia.  CT angiogram chest negative for PE but with asymmetric diffuse bilateral patchy airspace process more consolidated through the left lung and most severe in the left lower lobe consistent with pneumonia possibly related to aspiration.  EDP gave 1 dose of IV Levaquin.  Hospital service consulted for further evaluation and management for sepsis with acute hypoxic respiratory failure secondary to pneumonia.  Hospital summary will be described  on base problem approach.  Subjective: Patient seen and examined at bedside.  She is feeling much better, her breathing is back to baseline.  She is off oxygen.  Tolerating p.o. well.  Afebrile this morning.  Coughing intermittently with no sputum.  Discharge Diagnoses/Hospital Course:  Principal Problem:   Pneumonia Active Problems:   Ovarian carcinosarcoma, right ([place])   History of pulmonary embolism   Cough with hemoptysis   OSA (obstructive sleep apnea)   Acute respiratory failure with hypoxia (HCC)   Chronic diastolic CHF (congestive heart failure) (HCC)   Bronchospasm   Elevated troponin  Sepsis, POA - Resolved  Acute hypoxic respiratory failure, POA - Resolved  Pneumonia community-acquired versus aspiration, POA - Improved  Chest x-ray and CT consistent with pneumonia.  Procalcitonin was up.  Covid 19 and influenza were negative.  Suspicious of aspiration during colonoscopy.  She was treated with Levaquin, subsequently changed to IV Rocephin and azithromycin.  She was on oxygen supplementation which was successfully weaned off.  Procalcitonin trended down.  WBC trending down.  Will switch antibiotics to Levaquin to complete total of 7 days.  Ambulatory saturations stable no need for home oxygen.  Elevated troponin 2/2 demand ischemia from sepsis Cardiology was consulted, EKG without ischemic changes.  Since patient was asymptomatic cardiologist recommended outpatient work-up.  Chronic diastolic congestive heart failure, compensated Euvolemic now, she was treated with IV Lasix which will discontinue and switch to home dose.  Last TEE on 10/2019 LVEF 60 to 02%, grade 1 diastolic dysfunction, mild LVH.  Follow-up with cardiology  History of DVT/PE CT angiogram was negative.  She was continued on daily Xarelto  Essential hypertension Blood pressure medication was held during hospital stay to soft blood pressures.  BP has  improved, resume home medication with no changes.  Type 2  diabetes mellitus Stable, she was treated with sliding scale.  Hx ovarian/endometrial carcinoma S/p total abdominal hysterectomy, BSO, cryo reduction and chemotherapy, completed 2017. Continue outpatient follow-up with GYN/ONC  Morbid obesity Body mass index is 61.09 kg/m.  Discussed with patient needs for aggressive lifestyle changes/weight loss as this complicates all facets of care.  Patient reports losing weight with bariatric program, considering surgery.   All other chronic medical condition were stable during the hospitalization.  On the day of the discharge the patient's vitals were stable, and no other acute medical condition were reported by patient. the patient was felt safe to be discharge to   Discharge Instructions  You were cared for by a hospitalist during your hospital stay. If you have any questions about your discharge medications or the care you received while you were in the hospital after you are discharged, you can call the unit and asked to speak with the hospitalist on call if the hospitalist that took care of you is not available. Once you are discharged, your primary care physician will handle any further medical issues. Please note that NO REFILLS for any discharge medications will be authorized once you are discharged, as it is imperative that you return to your primary care physician (or establish a relationship with a primary care physician if you do not have one) for your aftercare needs so that they can reassess your need for medications and monitor your lab values.  Discharge Instructions    Diet - low sodium heart healthy   Complete by: As directed    Increase activity slowly   Complete by: As directed      Allergies as of 07/21/2020      Reactions   Penicillins Rash   Has patient had a PCN reaction causing immediate rash, facial/tongue/throat swelling, SOB or lightheadedness with hypotension: no Has patient had a PCN reaction causing severe rash  involving mucus membranes or skin necrosis: no Has patient had a PCN reaction that required hospitalization in the hospital at the time Has patient had a PCN reaction occurring within the last 10 years: no If all of the above answers are "NO", then may proceed with Cephalosporin use.      Medication List    TAKE these medications   acetaminophen 500 MG tablet Commonly known as: TYLENOL Take 1,000 mg by mouth every 6 (six) hours as needed for moderate pain.   albuterol 108 (90 Base) MCG/ACT inhaler Commonly known as: VENTOLIN HFA Inhale 2 puffs into the lungs every 6 (six) hours as needed for wheezing or shortness of breath. Please instruct patient in usage. What changed: Another medication with the same name was removed. Continue taking this medication, and follow the directions you see here.   amLODipine 5 MG tablet Commonly known as: NORVASC Take 5 mg by mouth daily. Notes to patient: Start tomorrow 4/17   diclofenac Sodium 1 % Gel Commonly known as: VOLTAREN Apply to knee once daily as needed What changed:   how much to take  how to take this  when to take this  reasons to take this   furosemide 20 MG tablet Commonly known as: LASIX Take 20 mg by mouth daily as needed for edema.   gabapentin 100 MG capsule Commonly known as: NEURONTIN Take 1 capsule (100 mg total) by mouth daily as needed (neuropathy).   B-D UF III MINI PEN NEEDLES 31G X 5 MM Misc Generic drug:  Insulin Pen Needle   Insulin Pen Needle 31G X 5 MM Misc Use as directed with Victoza   levofloxacin 750 MG tablet Commonly known as: LEVAQUIN Take 1 tablet (750 mg total) by mouth daily for 5 days.   lidocaine-prilocaine cream Commonly known as: EMLA Apply 1 application topically as needed (port access).   liraglutide 18 MG/3ML Sopn Commonly known as: VICTOZA Inject 1.8 mg into the skin daily. What changed: how much to take Notes to patient: Start today 4/16   omeprazole 40 MG  capsule Commonly known as: PRILOSEC Take 1 capsule (40 mg total) by mouth daily. Notes to patient: Start tomorrow 4/17   rivaroxaban 20 MG Tabs tablet Commonly known as: XARELTO Take 1 tablet (20 mg total) by mouth daily with supper.   Toviaz 8 MG Tb24 tablet Generic drug: fesoterodine TAKE 1 TABLET(8 MG) BY MOUTH DAILY What changed: See the new instructions. Notes to patient: Start tomorrow 4/17   triamcinolone cream 0.1 % Commonly known as: KENALOG APPLY TO THE AFFECTED AREA TWICE DAILY AS NEEDED FOR SKIN IRRITATION What changed: See the new instructions.   valACYclovir 500 MG tablet Commonly known as: Valtrex Take 1 tablet (500 mg total) by mouth 2 (two) times daily. For 3 days at start of outbreak What changed:   when to take this  reasons to take this   Vitamin D 50 MCG (2000 UT) Caps Take 8,000 Units by mouth daily. Notes to patient: Start tomorrow 4/17       Follow-up Information    Alison Rio, MD.   Specialty: Family Medicine Why: For Follow Up Contact information: [place] [address] [phone]        Elouise Munroe, MD Follow up.   Specialties: Cardiology, Radiology Why: [facility] - office will call you to schedule this appointment. Contact information: [address] [place] [address] [phone]              Allergies  Allergen Reactions  . Penicillins Rash    Has patient had a PCN reaction causing immediate rash, facial/tongue/throat swelling, SOB or lightheadedness with hypotension: no Has patient had a PCN reaction causing severe rash involving mucus membranes or skin necrosis: no Has patient had a PCN reaction that required hospitalization in the hospital at the time Has patient had a PCN reaction occurring within the last 10 years: no If all of the above answers are "NO", then may proceed with Cephalosporin use.     Consultations:  None   Procedures/Studies: CT  Angio Chest PE W/Cm &/Or Wo Cm  Result Date: 07/19/2020 CLINICAL DATA:  Hemoptysis, shortness of breath, colonoscopy earlier today. EXAM: CT ANGIOGRAPHY CHEST WITH CONTRAST TECHNIQUE: Multidetector CT imaging of the chest was performed using the standard protocol during bolus administration of intravenous contrast. Multiplanar CT image reconstructions and MIPs were obtained to evaluate the vascular anatomy. CONTRAST:  97mL OMNIPAQUE IOHEXOL 350 MG/ML SOLN COMPARISON:  05/29/2018 FINDINGS: Cardiovascular: Limited because of injection pressure limitations from the port catheter and body habitus. No large central or proximal hilar acute filling defect or pulmonary embolus by CTA. Pulmonary arteries are normal in caliber. Limited assessment of the peripheral segmental and subsegmental branches for small emboli. Intact aorta. Pulsation artifact across the ascending aorta. No aneurysm or dissection. No mediastinal hemorrhage or hematoma. Normal heart size without pericardial effusion. Right IJ power port catheter tip in the right atrium. Mediastinum/Nodes: Thyroid unremarkable. Trachea and central airways are patent. Lower esophagus  is fluid filled with an air-fluid level. Small to moderate hiatal hernia noted. No bulky adenopathy. Lungs/Pleura: Patchy bilateral airspace process involving all lobes of both lungs but certainly more severe and consolidative throughout the left lung compatible with infectious/inflammatory process such as pneumonia possibly related to aspiration. Other acute pneumonitis or acute pulmonary hemorrhage could have a similar appearance. No pleural abnormality, effusion or pneumothorax. Upper Abdomen: No acute abnormality. Musculoskeletal: Degenerative changes of the thoracic spine. Obese body habitus. No acute compression fracture. Intact sternum. Review of the MIP images confirms the above findings. IMPRESSION: Limited CTA but no significant central or proximal hilar large acute pulmonary  embolus. Difficult to exclude small peripheral emboli. Asymmetric diffuse bilateral patchy airspace process, more consolidated throughout the left lung and most severe in the left lower lobe. Findings concerning for acute pneumonia possibly related to aspiration. Other acute inflammatory pneumonitis, or acute pulmonary hemorrhage could have a similar appearance. Dilated esophagus with an air-fluid level and associated hiatal hernia. Electronically Signed   By: Jerilynn Mages.  Shick M.D.   On: 07/19/2020 15:29   DG Chest Port 1 View  Result Date: 07/19/2020 CLINICAL DATA:  Shortness of breath, hemoptysis EXAM: PORTABLE CHEST 1 VIEW COMPARISON:  02/19/2019 FINDINGS: Right IJ approach chest port remains in place. Heart size within normal limits. Extensive airspace opacity within the left lower lobe. Right lung is clear. No appreciable pleural fluid collection. No pneumothorax. IMPRESSION: Extensive airspace opacity within the left lower lobe concerning for pneumonia. Radiographic follow-up to resolution recommended. Electronically Signed   By: Davina Poke D.O.   On: 07/19/2020 13:54      Discharge Exam: Vitals:   07/21/20 0500 07/21/20 0852  BP: (!) 147/67 (!) 144/71  Pulse: 84 90  Resp: 20 20  Temp: 99.1 F (37.3 C) 98.5 F (36.9 C)  SpO2: 94% 94%   Vitals:   07/20/20 2305 07/21/20 0115 07/21/20 0500 07/21/20 0852  BP: (!) 142/71 135/65 (!) 147/67 (!) 144/71  Pulse: 96 87 84 90  Resp: 20 (!) 24 20 20   Temp: 99.8 F (37.7 C) 99.2 F (37.3 C) 99.1 F (37.3 C) 98.5 F (36.9 C)  TempSrc: Oral Oral Oral Oral  SpO2: 93% 93% 94% 94%  Weight:   (!) 166.4 kg   Height:   5' 5.5" (1.664 m)     General: Pt is alert, awake, not in acute distress. Obese  Cardiovascular: RRR, S1/S2 +, no rubs, no gallops Respiratory: CTA bilaterally, no wheezing, no rhonchi Abdominal: Soft, NT, ND, bowel sounds + Extremities: no edema, no cyanosis    The results of significant diagnostics from this  hospitalization (including imaging, microbiology, ancillary and laboratory) are listed below for reference.     Microbiology: Recent Results (from the past 240 hour(s))  SARS CORONAVIRUS 2 (TAT 6-24 HRS) Nasopharyngeal Nasopharyngeal Swab     Status: None   Collection Time: 07/16/20 10:21 AM   Specimen: Nasopharyngeal Swab  Result Value Ref Range Status   SARS Coronavirus 2 NEGATIVE NEGATIVE Final    Comment: (NOTE) SARS-CoV-2 target nucleic acids are NOT DETECTED.  The SARS-CoV-2 RNA is generally detectable in upper and lower respiratory specimens during the acute phase of infection. Negative results do not preclude SARS-CoV-2 infection, do not rule out co-infections with other pathogens, and should not be used as the sole basis for treatment or other patient management decisions. Negative results must be combined with clinical observations, patient history, and epidemiological information. The expected result is Negative.  Fact Sheet for Patients:  SugarRoll.be  Fact Sheet for Healthcare Providers: https://www.woods-mathews.com/  This test is not yet approved or cleared by the [place] FDA and  has been authorized for detection and/or diagnosis of SARS-CoV-2 by FDA under an Emergency Use Authorization (EUA). This EUA will remain  in effect (meaning this test can be used) for the duration of the COVID-19 declaration under Se ction 564(b)(1) of the Act, 21 U.S.C. section 360bbb-3(b)(1), unless the authorization is terminated or revoked sooner.  Performed at [facility], [place] [address], [place], [place] [address]   Resp Panel by RT-PCR (Flu A&B, Covid) Nasopharyngeal Swab     Status: None   Collection Time: 07/19/20  1:45 PM   Specimen: Nasopharyngeal Swab; Nasopharyngeal(NP) swabs in vial transport medium  Result Value Ref Range Status   SARS Coronavirus 2 by RT PCR NEGATIVE NEGATIVE Final    Comment: (NOTE) SARS-CoV-2  target nucleic acids are NOT DETECTED.  The SARS-CoV-2 RNA is generally detectable in upper respiratory specimens during the acute phase of infection. The lowest concentration of SARS-CoV-2 viral copies this assay can detect is 138 copies/mL. A negative result does not preclude SARS-Cov-2 infection and should not be used as the sole basis for treatment or other patient management decisions. A negative result may occur with  improper specimen collection/handling, submission of specimen other than nasopharyngeal swab, presence of viral mutation(s) within the areas targeted by this assay, and inadequate number of viral copies(<138 copies/mL). A negative result must be combined with clinical observations, patient history, and epidemiological information. The expected result is Negative.  Fact Sheet for Patients:  EntrepreneurPulse.com.au  Fact Sheet for Healthcare Providers:  IncredibleEmployment.be  This test is no t yet approved or cleared by the [place] FDA and  has been authorized for detection and/or diagnosis of SARS-CoV-2 by FDA under an Emergency Use Authorization (EUA). This EUA will remain  in effect (meaning this test can be used) for the duration of the COVID-19 declaration under Section 564(b)(1) of the Act, 21 U.S.C.section 360bbb-3(b)(1), unless the authorization is terminated  or revoked sooner.       Influenza A by PCR NEGATIVE NEGATIVE Final   Influenza B by PCR NEGATIVE NEGATIVE Final    Comment: (NOTE) The Xpert Xpress SARS-CoV-2/FLU/RSV plus assay is intended as an aid in the diagnosis of influenza from Nasopharyngeal swab specimens and should not be used as a sole basis for treatment. Nasal washings and aspirates are unacceptable for Xpert Xpress SARS-CoV-2/FLU/RSV testing.  Fact Sheet for Patients: EntrepreneurPulse.com.au  Fact Sheet for Healthcare  Providers: IncredibleEmployment.be  This test is not yet approved or cleared by the [place] FDA and has been authorized for detection and/or diagnosis of SARS-CoV-2 by FDA under an Emergency Use Authorization (EUA). This EUA will remain in effect (meaning this test can be used) for the duration of the COVID-19 declaration under Section 564(b)(1) of the Act, 21 U.S.C. section 360bbb-3(b)(1), unless the authorization is terminated or revoked.  Performed at [facility], [place] [address], [place], [place] [address]   Culture, sputum-assessment     Status: None   Collection Time: 07/20/20  5:00 PM   Specimen: Expectorated Sputum  Result Value Ref Range Status   Specimen Description EXPECTORATED SPUTUM  Final   Special Requests NONE  Final   Sputum evaluation   Final    THIS SPECIMEN IS ACCEPTABLE FOR SPUTUM CULTURE Performed at [facility], [place] [address], [place],  [address]    Report Status  07/20/2020 FINAL  Final  Culture, Respiratory w Gram Stain     Status: None (Preliminary result)   Collection Time: 07/20/20  5:00 PM  Result Value Ref Range Status   Specimen Description   Final    EXPECTORATED SPUTUM Performed at [facility], [place] [address] [place], [place] [address]    Special Requests   Final    NONE Reflexed from [id number] Performed at [place] [address] [place], [place] [address]    Gram Stain   Final    MODERATE WBC PRESENT,BOTH PMN AND MONONUCLEAR RARE GRAM POSITIVE COCCI RARE GRAM VARIABLE ROD    Culture   Final    CULTURE REINCUBATED FOR BETTER GROWTH Performed at [facility], [place] [address] [place], [place] [address]    Report Status PENDING  Incomplete     Labs: BNP (last 3 results) Recent Labs    07/19/20 2253  BNP 428.7*   Basic Metabolic Panel: Recent Labs  Lab 07/19/20 1345 07/20/20 0605 07/21/20 0411  NA 147* 140 141   K 3.5 3.5 3.2*  CL 114* 112* 109  CO2 21* 21* 24  GLUCOSE 117* 96 99  BUN 15 13 14   CREATININE 0.98 0.98 1.23*  CALCIUM 9.1 8.4* 8.5*   Liver Function Tests: Recent Labs  Lab 07/19/20 1345  AST 21  ALT 14  ALKPHOS 77  BILITOT 1.0  PROT 6.9  ALBUMIN 3.7   Recent Labs  Lab 07/19/20 1345  LIPASE 32   No results for input(s): AMMONIA in the last 168 hours. CBC: Recent Labs  Lab 07/19/20 1345 07/20/20 0605 07/21/20 0411  WBC 7.9 12.3* 10.8*  NEUTROABS 6.9  --   --   HGB 11.1* 9.5* 9.3*  HCT 35.3* 30.0* 29.0*  MCV 91.7 92.0 91.8  PLT 167 147* 154   Cardiac Enzymes: No results for input(s): CKTOTAL, CKMB, CKMBINDEX, TROPONINI in the last 168 hours. BNP: Invalid input(s): POCBNP CBG: Recent Labs  Lab 07/20/20 1259 07/20/20 1750 07/20/20 2103 07/21/20 0724 07/21/20 1139  GLUCAP 101* 114* 107* 95 96   D-Dimer No results for input(s): DDIMER in the last 72 hours. Hgb A1c Recent Labs    07/20/20 0605  HGBA1C 5.5   Lipid Profile No results for input(s): CHOL, HDL, LDLCALC, TRIG, CHOLHDL, LDLDIRECT in the last 72 hours. Thyroid function studies No results for input(s): TSH, T4TOTAL, T3FREE, THYROIDAB in the last 72 hours.  Invalid input(s): FREET3 Anemia work up No results for input(s): VITAMINB12, FOLATE, FERRITIN, TIBC, IRON, RETICCTPCT in the last 72 hours. Urinalysis    Component Value Date/Time   COLORURINE STRAW (A) 07/19/2020 1158   APPEARANCEUR CLEAR 07/19/2020 1158   LABSPEC 1.008 07/19/2020 1158   LABSPEC 1.015 04/23/2015 1247   PHURINE 6.0 07/19/2020 1158   GLUCOSEU NEGATIVE 07/19/2020 1158   GLUCOSEU Negative 04/23/2015 1247   HGBUR NEGATIVE 07/19/2020 1158   BILIRUBINUR NEGATIVE 07/19/2020 1158   BILIRUBINUR Color Interference 04/23/2015 1247   KETONESUR NEGATIVE 07/19/2020 1158   PROTEINUR NEGATIVE 07/19/2020 1158   UROBILINOGEN Color Interference 04/23/2015 1247   NITRITE NEGATIVE 07/19/2020 1158   LEUKOCYTESUR NEGATIVE  07/19/2020 1158   LEUKOCYTESUR Color Interference 04/23/2015 1247   Sepsis Labs Invalid input(s): PROCALCITONIN,  WBC,  LACTICIDVEN Microbiology Recent Results (from the past 240 hour(s))  SARS CORONAVIRUS 2 (TAT 6-24 HRS) Nasopharyngeal Nasopharyngeal Swab     Status: None   Collection Time: 07/16/20 10:21 AM   Specimen: Nasopharyngeal Swab  Result  Value Ref Range Status   SARS Coronavirus 2 NEGATIVE NEGATIVE Final    Comment: (NOTE) SARS-CoV-2 target nucleic acids are NOT DETECTED.  The SARS-CoV-2 RNA is generally detectable in upper and lower respiratory specimens during the acute phase of infection. Negative results do not preclude SARS-CoV-2 infection, do not rule out co-infections with other pathogens, and should not be used as the sole basis for treatment or other patient management decisions. Negative results must be combined with clinical observations, patient history, and epidemiological information. The expected result is Negative.  Fact Sheet for Patients: SugarRoll.be  Fact Sheet for Healthcare Providers: https://www.woods-mathews.com/  This test is not yet approved or cleared by the [place] FDA and  has been authorized for detection and/or diagnosis of SARS-CoV-2 by FDA under an Emergency Use Authorization (EUA). This EUA will remain  in effect (meaning this test can be used) for the duration of the COVID-19 declaration under Se ction 564(b)(1) of the Act, 21 U.S.C. section 360bbb-3(b)(1), unless the authorization is terminated or revoked sooner.  Performed at [facility], [place] [address], [place], [place] [address]   Resp Panel by RT-PCR (Flu A&B, Covid) Nasopharyngeal Swab     Status: None   Collection Time: 07/19/20  1:45 PM   Specimen: Nasopharyngeal Swab; Nasopharyngeal(NP) swabs in vial transport medium  Result Value Ref Range Status   SARS Coronavirus 2 by RT PCR NEGATIVE NEGATIVE Final    Comment:  (NOTE) SARS-CoV-2 target nucleic acids are NOT DETECTED.  The SARS-CoV-2 RNA is generally detectable in upper respiratory specimens during the acute phase of infection. The lowest concentration of SARS-CoV-2 viral copies this assay can detect is 138 copies/mL. A negative result does not preclude SARS-Cov-2 infection and should not be used as the sole basis for treatment or other patient management decisions. A negative result may occur with  improper specimen collection/handling, submission of specimen other than nasopharyngeal swab, presence of viral mutation(s) within the areas targeted by this assay, and inadequate number of viral copies(<138 copies/mL). A negative result must be combined with clinical observations, patient history, and epidemiological information. The expected result is Negative.  Fact Sheet for Patients:  EntrepreneurPulse.com.au  Fact Sheet for Healthcare Providers:  IncredibleEmployment.be  This test is no t yet approved or cleared by the [place] FDA and  has been authorized for detection and/or diagnosis of SARS-CoV-2 by FDA under an Emergency Use Authorization (EUA). This EUA will remain  in effect (meaning this test can be used) for the duration of the COVID-19 declaration under Section 564(b)(1) of the Act, 21 U.S.C.section 360bbb-3(b)(1), unless the authorization is terminated  or revoked sooner.       Influenza A by PCR NEGATIVE NEGATIVE Final   Influenza B by PCR NEGATIVE NEGATIVE Final    Comment: (NOTE) The Xpert Xpress SARS-CoV-2/FLU/RSV plus assay is intended as an aid in the diagnosis of influenza from Nasopharyngeal swab specimens and should not be used as a sole basis for treatment. Nasal washings and aspirates are unacceptable for Xpert Xpress SARS-CoV-2/FLU/RSV testing.  Fact Sheet for Patients: EntrepreneurPulse.com.au  Fact Sheet for Healthcare  Providers: IncredibleEmployment.be  This test is not yet approved or cleared by the [place] FDA and has been authorized for detection and/or diagnosis of SARS-CoV-2 by FDA under an Emergency Use Authorization (EUA). This EUA will remain in effect (meaning this test can be used) for the duration of the COVID-19 declaration under Section 564(b)(1) of the Act, 21 U.S.C. section 360bbb-3(b)(1), unless the authorization  is terminated or revoked.  Performed at [facility], [place] [address] [place], [place] [address]   Culture, sputum-assessment     Status: None   Collection Time: 07/20/20  5:00 PM   Specimen: Expectorated Sputum  Result Value Ref Range Status   Specimen Description EXPECTORATED SPUTUM  Final   Special Requests NONE  Final   Sputum evaluation   Final    THIS SPECIMEN IS ACCEPTABLE FOR SPUTUM CULTURE Performed at [facility], [place] [address], [place], [place] [address]    Report Status 07/20/2020 FINAL  Final  Culture, Respiratory w Gram Stain     Status: None (Preliminary result)   Collection Time: 07/20/20  5:00 PM  Result Value Ref Range Status   Specimen Description   Final    EXPECTORATED SPUTUM Performed at [place] [address], [place], [place] [address]    Special Requests   Final    NONE Reflexed from [id number] Performed at [place] [address], [place], [place] [address]    Gram Stain   Final    MODERATE WBC PRESENT,BOTH PMN AND MONONUCLEAR RARE GRAM POSITIVE COCCI RARE GRAM VARIABLE ROD    Culture   Final    CULTURE REINCUBATED FOR BETTER GROWTH Performed at [facility], [place] [address] [place], [place] [address]    Report Status PENDING  Incomplete     Time coordinating discharge:  30 minutes  SIGNED:  Chipper Oman, MD  [facility] 07/21/2020, 1:15 PM   Note - This record has been created using [employer].  Chart creation errors have been sought, but may not always have been located. Such creation errors do not reflect on the standard of medical care.

## 2020-07-21 NOTE — Progress Notes (Signed)
   07/20/20 2100  Assess: MEWS Score  Temp (!) 100.5 F (38.1 C) (RN notified)  BP (!) 142/61  Pulse Rate (!) 103  Resp (!) 24  Level of Consciousness Alert  SpO2 93 %  O2 Device Room Air  Patient Activity (if Appropriate) In bed  O2 Flow Rate (L/min)  (room air)  Assess: MEWS Score  MEWS Temp 1  MEWS Systolic 0  MEWS Pulse 1  MEWS RR 1  MEWS LOC 0  MEWS Score 3  MEWS Score Color Yellow  Assess: if the MEWS score is Yellow or Red  Were vital signs taken at a resting state? Yes  Focused Assessment No change from prior assessment  Early Detection of Sepsis Score *See Row Information* High  MEWS guidelines implemented *See Row Information* Yes  Treat  MEWS Interventions Administered prn meds/treatments  Pain Scale 0-10  Pain Score 0  Complains of Fever  Interventions Medication (see MAR)  Patients response to intervention Decreased  Take Vital Signs  Increase Vital Sign Frequency  Yellow: Q 2hr X 2 then Q 4hr X 2, if remains yellow, continue Q 4hrs  Escalate  MEWS: Escalate Yellow: discuss with charge nurse/RN and consider discussing with provider and RRT  Notify: Charge Nurse/RN  Name of Charge Nurse/RN Notified Kristine, RN  Date Charge Nurse/RN Notified 07/20/20  Time Charge Nurse/RN Notified 2101  Notify: Provider  Provider Name/Title Jeannette Corpus (On call provider)  Date Provider Notified 07/20/20  Time Provider Notified 2230  Notification Type Page  Notification Reason Other (Comment) (Patient had a fever)  Provider response No new orders  Date of Provider Response  (On call provider did not respond)  Time of Provider Response  (On call provider did not respond)

## 2020-07-21 NOTE — Plan of Care (Signed)
  Problem: Education: Goal: Knowledge of General Education information will improve Description: Including pain rating scale, medication(s)/side effects and non-pharmacologic comfort measures Outcome: Progressing   Problem: Activity: Goal: Risk for activity intolerance will decrease Outcome: Progressing   Problem: Coping: Goal: Level of anxiety will decrease Outcome: Progressing   Problem: Elimination: Goal: Will not experience complications related to bowel motility Outcome: Progressing Goal: Will not experience complications related to urinary retention Outcome: Progressing   Problem: Pain Managment: Goal: General experience of comfort will improve Outcome: Progressing   Problem: Safety: Goal: Ability to remain free from injury will improve Outcome: Progressing   Problem: Skin Integrity: Goal: Risk for impaired skin integrity will decrease Outcome: Progressing   Problem: Respiratory: Goal: Ability to maintain adequate ventilation will improve Outcome: Progressing Goal: Ability to maintain a clear airway will improve Outcome: Progressing

## 2020-07-21 NOTE — Progress Notes (Signed)
SATURATION QUALIFICATIONS: (This note is used to comply with regulatory documentation for home oxygen)  Patient Saturations on Room Air at Rest = 94%  Patient Saturations on Room Air while Ambulating = 90%  Patient Saturations on N/A Liters of oxygen while Ambulating = N/A  Please briefly explain why patient needs home oxygen: Does not meet criteria for home oxygen

## 2020-07-23 ENCOUNTER — Telehealth: Payer: Self-pay | Admitting: *Deleted

## 2020-07-23 LAB — CULTURE, RESPIRATORY W GRAM STAIN: Culture: NORMAL

## 2020-07-23 NOTE — Telephone Encounter (Signed)
Transition Care Management Unsuccessful Follow-up Telephone Call  Date of discharge and from where:  07/21/2020 - [facility]  Attempts:  1st Attempt  Reason for unsuccessful TCM follow-up call:  Voice mail full

## 2020-07-24 NOTE — Telephone Encounter (Signed)
Transition Care Management Follow-up Telephone Call  Date of discharge and from where: 07/21/2020 - [facility]  How have you been since you were released from the hospital? "Getting by"  Any questions or concerns? No  Items Reviewed:  Did the pt receive and understand the discharge instructions provided? Yes   Medications obtained and verified? Yes   Other? No   Any new allergies since your discharge? No   Dietary orders reviewed? No  Do you have support at home? Yes    Functional Questionnaire: (I = Independent and D = Dependent) ADLs: I  Bathing/Dressing- I  Meal Prep- I  Eating- I  Maintaining continence- I  Transferring/Ambulation- I  Managing Meds- I  Follow up appointments reviewed:   PCP Hospital f/u appt confirmed? Yes  Scheduled to see PCP on 07/26/2020 @ 0930.  [facility] f/u appt confirmed? No    Are transportation arrangements needed? No   If their condition worsens, is the pt aware to call PCP or go to the Emergency Dept.? Yes  Was the patient provided with contact information for the PCP's office or ED? Yes  Was to pt encouraged to call back with questions or concerns? Yes

## 2020-07-26 ENCOUNTER — Other Ambulatory Visit: Payer: Self-pay

## 2020-07-26 ENCOUNTER — Ambulatory Visit (INDEPENDENT_AMBULATORY_CARE_PROVIDER_SITE_OTHER): Payer: Medicare HMO | Admitting: Family Medicine

## 2020-07-26 VITALS — BP 135/75 | HR 77

## 2020-07-26 DIAGNOSIS — J189 Pneumonia, unspecified organism: Secondary | ICD-10-CM | POA: Diagnosis not present

## 2020-07-26 NOTE — Progress Notes (Signed)
    SUBJECTIVE:   CHIEF COMPLAINT / HPI:   Hospital follow-up for pneumonia [name] was recently hospitalized for pneumonia which was thought to be secondary to aspiration related to her colonoscopy.  She was treated with a 7-day course of Levaquin she has now completed.  She reports that she feels much better from a respiratory standpoint although she does feel particularly low energy.  She limits her activity to household chores at this time.  She has had a previous exercise regimen which she has not yet felt the strength to go back to.  Anemia Chart review shows that she has a history of anemia which we will follow-up on today.  She currently denies any vaginal or rectal bleeding.  She currently takes iron supplements at home.  PERTINENT  PMH / PSH: Hypertension, OSA 80 stage III  OBJECTIVE:   BP 135/75   Pulse 77   LMP 10/26/2013   SpO2 99%    General: Alert and cooperative and appears to be in no acute distress.  She was able to stand up from her chair in the exam room and step up onto the exam table. Cardio: Normal S1 and S2, no S3 or S4. Rhythm is regular. No murmurs or rubs.   Pulm: Clear to auscultation bilaterally, no crackles, wheezing, or diminished breath sounds. Normal respiratory effort Abdomen: Bowel sounds normal. Abdomen soft and non-tender.  Extremities: No peripheral edema. Warm/ well perfused.  Strong radial pulses. Neuro: Cranial nerves grossly intact  ASSESSMENT/PLAN:   Pneumonia Overall, recovering well.  She does still seem to be low energy and would like a little bit of help recovering from her deconditioning. -Placed referral to physical therapy for strength and conditioning. -Follow-up CBC and CMP -She has follow-up scheduled with cardiology.   Anemia Normal MCV, hemoglobin of 9.3 during her hospitalization.  Her last colonoscopy in 2022 did demonstrate some polyps and she will need a repeat colonoscopy in 2025.  She has previously had a  hysterectomy.  We will follow-up a CBC today and potentially add on a ferritin depending on her hemoglobin.  Follow-up with PCP in 3 months or sooner if new concerns arise.  Matilde Haymaker, MD [place]

## 2020-07-26 NOTE — Assessment & Plan Note (Addendum)
Overall, recovering well.  She does still seem to be low energy and would like a little bit of help recovering from her deconditioning. -Placed referral to physical therapy for strength and conditioning. -Follow-up CBC and CMP -She has follow-up scheduled with cardiology.

## 2020-07-26 NOTE — Patient Instructions (Signed)
It was great to see you today.  Here is a quick review of the things we talked about:  Recent hospitalization for pneumonia: I am glad that you are feeling better since her hospitalization.  I am sorry that you are not totally back to baseline from an exercise perspective.  I would like you to start being as active as you feel comfortable.    I have placed a referral to physical therapy.  You should get a call in the next 1-2 weeks to set up your appointment.  Please let me know if you have not received a call in the next 2 weeks.    If all of your labs are normal, I will send you a message over my chart or send you a letter.  If there is anything to discuss, I will give you a phone call.

## 2020-07-27 ENCOUNTER — Other Ambulatory Visit: Payer: Self-pay | Admitting: Family Medicine

## 2020-07-27 DIAGNOSIS — D649 Anemia, unspecified: Secondary | ICD-10-CM

## 2020-07-27 LAB — BASIC METABOLIC PANEL
BUN/Creatinine Ratio: 13 (ref 9–23)
BUN: 15 mg/dL (ref 6–24)
CO2: 20 mmol/L (ref 20–29)
Calcium: 9.5 mg/dL (ref 8.7–10.2)
Chloride: 99 mmol/L (ref 96–106)
Creatinine, Ser: 1.15 mg/dL — ABNORMAL HIGH (ref 0.57–1.00)
Glucose: 75 mg/dL (ref 65–99)
Potassium: 4.8 mmol/L (ref 3.5–5.2)
Sodium: 141 mmol/L (ref 134–144)
eGFR: 56 mL/min/{1.73_m2} — ABNORMAL LOW (ref >59)

## 2020-07-27 LAB — CBC
Hematocrit: 34 % (ref 34.0–46.6)
Hemoglobin: 11.4 g/dL (ref 11.1–15.9)
MCH: 29.5 pg (ref 26.6–33.0)
MCHC: 33.5 g/dL (ref 31.5–35.7)
MCV: 88 fL (ref 79–97)
Platelets: 336 10*3/uL (ref 150–450)
RBC: 3.87 x10E6/uL (ref 3.77–5.28)
RDW: 13.8 % (ref 11.7–15.4)
WBC: 7.4 10*3/uL (ref 3.4–10.8)

## 2020-07-30 ENCOUNTER — Telehealth: Payer: Self-pay | Admitting: Gastroenterology

## 2020-07-30 ENCOUNTER — Encounter: Payer: Self-pay | Admitting: Gastroenterology

## 2020-07-30 DIAGNOSIS — R194 Change in bowel habit: Secondary | ICD-10-CM

## 2020-07-30 LAB — SPECIMEN STATUS REPORT

## 2020-07-30 LAB — WHITE BLOOD COUNT AND DIFFERENTIAL
Basophils Absolute: 0.1 10*3/uL (ref 0.0–0.2)
Basos: 1 %
EOS (ABSOLUTE): 0.3 10*3/uL (ref 0.0–0.4)
Eos: 4 %
Immature Grans (Abs): 0.1 10*3/uL (ref 0.0–0.1)
Immature Granulocytes: 1 %
Lymphocytes Absolute: 2.1 10*3/uL (ref 0.7–3.1)
Lymphs: 29 %
Monocytes Absolute: 0.4 10*3/uL (ref 0.1–0.9)
Monocytes: 6 %
Neutrophils Absolute: 4.2 10*3/uL (ref 1.4–7.0)
Neutrophils: 59 %
WBC: 7 10*3/uL (ref 3.4–10.8)

## 2020-07-30 LAB — FERRITIN: Ferritin: 114 ng/mL (ref 15–150)

## 2020-07-30 NOTE — Telephone Encounter (Signed)
Inbound call from patient requesting a call from a nurse please.  States she has been having trouble with her bowel movements.  Please advise.

## 2020-07-30 NOTE — Telephone Encounter (Signed)
More likely than infection is a change in bowel habits from having been on antibiotics.  But we can do a C difficile Toxin A/B and GDH antigen this week to be sure.  - HD

## 2020-07-30 NOTE — Telephone Encounter (Signed)
Spoke with patient she states that she noticed an odor with bowel movements while in the hospital, she states that she smells "infection". She states that she reported this in the hospital and they told her it should be fine because she was on several antibiotics at the time. She was sent home with Levofloxacin for 5 days. She is not eating much right now so she is not having a BM everyday. She states that she took a probiotic gummy yesterday and was able to have a bowel movement. She states that her stool doesn't smell like a regular stools. Reports that the stools started firm and turned soft, describes as brown. No fever or pain. Some stomach pain when she has to have a bowel movement but nothing abnormal or severe. We have also discussed her pathology results. Please advise, thanks.

## 2020-07-30 NOTE — Telephone Encounter (Signed)
Spoke with patient in regards to recommendations. She is aware that no appt is necessary, she can stop by the lab to pick up the stool kit and instructions on how to collect the sample. Patient verbalized understanding and had no concerns at the end of the call.   Lab order in epic.

## 2020-07-31 ENCOUNTER — Other Ambulatory Visit: Payer: Medicare HMO

## 2020-08-03 ENCOUNTER — Other Ambulatory Visit: Payer: Medicare HMO

## 2020-08-03 DIAGNOSIS — R194 Change in bowel habit: Secondary | ICD-10-CM

## 2020-08-04 LAB — C. DIFFICILE GDH AND TOXIN A/B
GDH ANTIGEN: NOT DETECTED
MICRO NUMBER:: 11831740
SPECIMEN QUALITY:: ADEQUATE
TOXIN A AND B: NOT DETECTED

## 2020-08-06 ENCOUNTER — Other Ambulatory Visit: Payer: Self-pay

## 2020-08-06 ENCOUNTER — Ambulatory Visit: Payer: Medicare HMO | Attending: Family Medicine

## 2020-08-06 VITALS — HR 80

## 2020-08-06 DIAGNOSIS — R2689 Other abnormalities of gait and mobility: Secondary | ICD-10-CM | POA: Diagnosis present

## 2020-08-06 DIAGNOSIS — R293 Abnormal posture: Secondary | ICD-10-CM | POA: Diagnosis present

## 2020-08-06 DIAGNOSIS — M6281 Muscle weakness (generalized): Secondary | ICD-10-CM | POA: Diagnosis present

## 2020-08-06 DIAGNOSIS — R2681 Unsteadiness on feet: Secondary | ICD-10-CM | POA: Insufficient documentation

## 2020-08-06 NOTE — Therapy (Addendum)
[place], [place], [address] Phone: [phone]   Fax:  [phone]  Physical Therapy Evaluation  Patient Details  Name: [name] MRN: [id number] Date of Birth: 09/25/1965 Referring Provider (PT): Martyn Malay, MD   Encounter Date: 08/06/2020   PT End of Session - 08/06/20 1332    Visit Number 1    Number of Visits 17    Date for PT Re-Evaluation 10/03/20    Authorization Type [employer] MCR - FOTO 6th and 10th    Progress Note Due on Visit 10    PT Start Time 0925   arrived late   PT Stop Time 1002    PT Time Calculation (min) 37 min    Activity Tolerance Patient tolerated treatment well;Patient limited by fatigue    Behavior During Therapy [facility] for tasks assessed/performed           Past Medical History:  Diagnosis Date  . Allergy   . Anemia   . Anxiety   . Arthritis   . Blood transfusion without reported diagnosis   . Cough   . Cough 06/11/2018  . Difficulty sleeping 06/11/2018  . Diverticulitis with perforation 2010  . DVT (deep vein thrombosis) in pregnancy 11/24/2016  . DVT, lower extremity ([place])   . Endometrial cancer ([place])   . Enlarged lymph nodes 08/13/2017  . Family history of adverse reaction to anesthesia    pts daughter had difficulty awakening following anesthesia, long time to wake up  . GERD (gastroesophageal reflux disease)   . Headache   . Hemoptysis 04/22/2018  . History of bronchitis   . History of ear infections   . Hospital discharge follow-up 03/02/2019  . Hx of migraines   . Hyperlipidemia   . Hypertension   . IBS (irritable bowel syndrome)   . Kidney infection   . Lupus (systemic lupus erythematosus) ([place]) 02/2017  . Morbid obesity ([place])   . Neck pain 01/13/2019  . Ovarian cancer ([place]) dx'd 01/2015  . PONV (postoperative nausea and vomiting)   . Port-A-Cath in place    Right side  . Portacath in place 09/12/2015  . Pre-diabetes   . Pyelonephritis   .  Right knee pain 11/28/2011  . Scleroderma ([place])   . Screen for colon cancer 03/02/2019  . Slow rate of speech 08/22/2015   Chronic from CVA.  She also has loss of left nasolabial fold and slight left facial droop/small asymmetry on the left side secondary to CVA  . UTI (urinary tract infection) 05/07/2018    Past Surgical History:  Procedure Laterality Date  . ABDOMINAL HYSTERECTOMY N/A 03/13/2015   Procedure: TOTAL HYSTERECTOMY ABDOMINAL BILATERAL SALPINGO OOPHORECTOMY RADICAL TUMOR [place];  Surgeon: Everitt Amber, MD;  Location: [facility];  Service: Gynecology;  Laterality: N/A;  . BOWEL RESECTION  03/13/2015   Procedure: SMALL BOWEL RESECTION;  Surgeon: Everitt Amber, MD;  Location: [facility];  Service: Gynecology;;  . [place] and 1984  . COLONOSCOPY WITH PROPOFOL N/A 07/19/2020   Procedure: COLONOSCOPY WITH PROPOFOL;  Surgeon: Doran Stabler, MD;  Location: [facility];  Service: Gastroenterology;  Laterality: N/A;  . COLOSTOMY  2008  . COLOSTOMY TAKEDOWN    . ESOPHAGOGASTRODUODENOSCOPY (EGD) WITH PROPOFOL N/A 12/21/2018   Procedure: ESOPHAGOGASTRODUODENOSCOPY (EGD) WITH PROPOFOL;  Surgeon: Doran Stabler, MD;  Location: [facility];  Service: Gastroenterology;  Laterality: N/A;  . HEMOSTASIS CLIP PLACEMENT  07/19/2020   Procedure: HEMOSTASIS CLIP PLACEMENT;  Surgeon: Doran Stabler, MD;  Location: [facility];  Service: Gastroenterology;;  . LAPAROTOMY N/A 03/13/2015   Procedure: EXPLORATORY LAPAROTOMY;  Surgeon: Everitt Amber, MD;  Location: [facility];  Service: Gynecology;  Laterality: N/A;  . POLYPECTOMY  07/19/2020   Procedure: POLYPECTOMY;  Surgeon: Doran Stabler, MD;  Location: [facility];  Service: Gastroenterology;;  . [place]  . VENTRAL HERNIA REPAIR  03/13/2015   Procedure: HERNIA REPAIR VENTRAL ADULT;  Surgeon: Everitt Amber, MD;  Location: [facility];  Service: Gynecology;;    Vitals:   08/06/20 1322  Pulse: 80      Subjective  Assessment - 08/06/20 0926    Subjective Pt presents to PT after recent hospitalization for pneumonia and notes deconditioning post discharge. Prior to hospitalizaiton she was performing a chair exercise routine and notes that she generally feels more out of breath with decreased energy levels post-pneumonia. She denies any joint pain or discomfort, she just has decreased activity tolerance. She wants to order an SpO2 monitor for home use. Pt is still having minor stomach pains and is still awaiting results on colonscopy. Pt notes that she has not been able to sleep well since discharge from hospital.    Pertinent History Numerous and complex PMH - recent hospitalization 07/19/20 - 07/21/20 for pneumonia    Limitations Walking;Standing    How long can you sit comfortably? indefinite    How long can you stand comfortably? 45-60 minutes    How long can you walk comfortably? 5 minutes    Patient Stated Goals pt would like to improve activty tolerance in order to get back to start with aquatic therapy program again    Currently in Pain? No/denies    Pain Score 0-No pain              [facility] PT Assessment - 08/06/20 0001      Assessment   Medical Diagnosis J18.9 (ICD-10-CM) - Pneumonia of both lungs due to infectious organism, unspecified part of lung    Referring Provider (PT) Martyn Malay, MD    Hand Dominance Right    Next MD Visit 08/28/20    Prior Therapy yes - OPPT for shoulder and neck pain in 2020      Precautions   Precautions None      Restrictions   Weight Bearing Restrictions No      Balance Screen   Has the patient fallen in the past 6 months No    Has the patient had a decrease in activity level because of a fear of falling?  No    Is the patient reluctant to leave their home because of a fear of falling?  No      Home Environment   Living Environment Private residence    Living Arrangements Parent    Type of [place] to enter    Entrance  Stairs-Number of Steps 3    Entrance Stairs-Rails Right    [place] One level      Prior Function   Level of [place] Unemployed      Observation/Other Assessments   Focus on Therapeutic Outcomes (FOTO)  36% Function      Sensation   Light Touch Appears Intact      Strength   Right Hip Flexion 4/5    Right Hip ABduction 4/5    Right Hip ADduction 4/5    Left Hip  Flexion 4/5    Left Hip ABduction 4/5    Left Hip ADduction 4/5    Right Knee Flexion 4+/5    Right Knee Extension 5/5    Left Knee Flexion 4/5    Left Knee Extension 5/5      Transfers   Comments 30 Second Sit to Stand -- 10 reps      Ambulation/Gait   Ambulation/Gait Yes    Ambulation/Gait Assistance 6: Modified independent (Device/Increase time)    Ambulation Distance (Feet) 50 Feet    Assistive device Straight cane    Gait Pattern Antalgic;Trendelenburg;Wide base of support;Trunk flexed    Ambulation Surface Level      6 minute walk test results    Aerobic Endurance Distance Walked 655    Endurance additional comments required sitting rest break after 3 minutes approx 536ft; then able to finish test after 75 sec rest                      Objective measurements completed on examination: See above findings.       [place] Adult PT Treatment/Exercise - 08/06/20 0001      Knee/Hip Exercises: Seated   Sit to Sand 10 reps;without UE support                  PT Education - 08/06/20 1325    Education Details eval finds, POC, HEP    Person(s) Educated Patient    Methods Explanation;Demonstration;Handout    Comprehension Verbalized understanding;Returned demonstration            PT Short Term Goals - 08/06/20 1342      PT SHORT TERM GOAL #1   Title Pt will be independent with initial HEP for improved carryover    Baseline initial HEP given    Time 3    Period Days    Status New    Target Date 08/27/20      PT SHORT TERM GOAL #2   Title Pt  will be compliant with walking program for improved endurance and mobility    Time 3    Period Weeks    Status New    Target Date 08/27/20             PT Long Term Goals - 08/06/20 1343      PT LONG TERM GOAL #1   Title Pt will improve FOTO function score to no less than 61% in order to improve confidence and functional ability    Baseline 36% function    Time 8    Period Weeks    Status New    Target Date 10/01/20      PT LONG TERM GOAL #2   Title Pt will improve reps in 30 Sec Sit to Stand to no less than 12 reps in order to improve functional mobility    Baseline 10 reps    Time 8    Period Weeks    Status New    Target Date 10/01/20      PT LONG TERM GOAL #3   Title Pt will increase distance in 6MWT to no less than 967ft with stable O2 sats in order to improve endurance and community navigation    Baseline see flowsheet    Time 8    Period Weeks    Status New    Target Date 09/17/20                  Plan -  08/06/20 1334    Clinical Impression Statement Pt presents to PT after recent hospitalization for pneumonia and notes deconditioning post discharge. Physical findings are consistent with MD impression, as pt demonstrates decreased 6MWT ambulatory distance compared to age-related norm. She also shows decreased proximal hip muscle strength and lowered functional mobility as evidenced by MMT and 30 Second STS. Pt would benefit from skilled PT services in order to improve functional mobility and endurance so she can increase ADL performance and QoL. PT instructed pt to begin home progressive walking program while monitoring O2 sats, starting with 3 minutes as this was her initial limit on 6MWT.    Personal Factors and Comorbidities Comorbidity 3+;Fitness    Comorbidities SLE; HTN; Cancer    Examination-Activity Limitations Stand;Squat;Stairs;Locomotion Level;Carry;Lift;Transfers    Examination-Participation Restrictions Yard Work;Shop;Community Activity     Stability/Clinical Decision Making Stable/Uncomplicated    Clinical Decision Making Moderate    Rehab Potential Good    PT Frequency 2x / week    PT Duration 8 weeks    PT Treatment/Interventions ADLs/Self Care Home Management;Aquatic Therapy;Electrical Stimulation;Moist Heat;Cryotherapy;Ultrasound;Gait Scientist, forensic;Therapeutic activities;Therapeutic exercise;Functional mobility training;Balance training;Neuromuscular re-education;Patient/family education;Manual techniques    PT Next Visit Plan assess response to HEP exercise and walking program; progress endurance exercises as able    PT Home Exercise Plan Access Code: [email or web address]    Consulted and Agree with Plan of Care Patient           Patient will benefit from skilled therapeutic intervention in order to improve the following deficits and impairments:  Abnormal gait,Decreased activity tolerance,Decreased balance,Decreased endurance,Decreased mobility,Decreased strength,Difficulty walking  Visit Diagnosis: Unsteadiness on feet  Muscle weakness (generalized)  Other abnormalities of gait and mobility     Problem List Patient Active Problem List   Diagnosis Date Noted  . Acute respiratory failure with hypoxia ([place]) 07/19/2020  . Chronic diastolic CHF (congestive heart failure) ([place]) 07/19/2020  . Bronchospasm 07/19/2020  . Elevated troponin 07/19/2020  . Acute nasopharyngitis 05/18/2020  . Incontinence 03/17/2020  . Need for immunization against influenza 01/05/2020  . Herpes simplex virus (HSV) infection of buttock 01/05/2020  . OSA (obstructive sleep apnea) 07/25/2019  . Prediabetes 07/25/2019  . Pneumonia 02/19/2019  . Lateral epicondylitis of left elbow 01/13/2019  . Normocytic anemia 05/29/2018  . Scleroderma ([place]) 04/22/2018  . Cough with hemoptysis 04/22/2018  . Swelling of foot joint, right 04/08/2018  . Positive ANA (antinuclear antibody) 09/23/2016  . Eczema 01/27/2016  . History of pulmonary embolism  09/12/2015  . Long term current use of anticoagulant therapy 09/06/2015  . Chemotherapy-induced peripheral neuropathy ([place]) 06/13/2015  . CKD (chronic kidney disease) stage 3, GFR 30-59 ml/min ([facility]) 05/12/2015  . GERD (gastroesophageal reflux disease) 05/12/2015  . Ovarian carcinosarcoma, right ([place]) 03/29/2015  . Endometrial ca ([place]) 03/29/2015  . Ventral hernia without obstruction or gangrene   . Morbid obesity ([place]) 02/19/2015  . Essential hypertension, benign   . Stress at home 12/14/2014  . Left knee pain 08/04/2013  . Seasonal allergies 07/29/2013    Ward Chatters, PT, DPT 08/06/20 1:49 PM  [facility] [address] [place], [place], [address] Phone: [phone]   Fax:  [phone]  Name: [name] MRN: [id number] Date of Birth: 07-Jun-1965   Referring diagnosis? J18.9 (ICD-10-CM) - Pneumonia of both lungs due to infectious organism, unspecified part of lung  Treatment diagnosis? (if different than referring diagnosis)   Unsteadiness on feet  Muscle weakness (generalized)  Other abnormalities of gait and  mobility What was this (referring dx) caused by? []  Surgery []  Fall []  Ongoing issue [x]  Arthritis []  Other: ____________  Laterality: []  Rt []  Lt [x]  Both  Check all possible CPT codes:      [x]  97110 (Therapeutic Exercise)  []  92507 (SLP Treatment)  [x]  97112 (Neuro Re-ed)   []  92526 (Swallowing Treatment)   [x]  97116 (Gait Training)   []  [id number] (Cognitive Training, 1st 15 minutes) [x]  97140 (Manual Therapy)   []  97130 (Cognitive Training, each add'l 15 minutes)  [x]  97530 (Therapeutic Activities)  []  Other, List CPT Code ____________    [x]  [id number] (Self Care)       []  All codes above (97110 - 97535)  []  97012 (Mechanical Traction)  []  97014 (E-stim Unattended)  []  97032 (E-stim manual)  []  97033 (Ionto)  []  97035 (Ultrasound)  []  97760 (Orthotic Fit) []  [id number] (Physical Performance  Training) []  [id number] (Aquatic Therapy) []  97034 (Contrast Bath) []  [id number] (Paraffin) []  97597 (Wound Care 1st 20 sq cm) []  97598 (Wound Care each add'l 20 sq cm) [x]  97016 (Vasopneumatic Device) []  [id number] Comptroller) []  [id number] (Prosthetic Training)

## 2020-08-07 ENCOUNTER — Telehealth: Payer: Self-pay | Admitting: Gastroenterology

## 2020-08-07 NOTE — Telephone Encounter (Signed)
Pt called inquiring about lab results. Pls call her.  °

## 2020-08-07 NOTE — Telephone Encounter (Signed)
Spoke with patient in regards to her stool study results. Patient states that she is still having abdominal pain and a change in her bowel habits. She has been scheduled for a follow up with Carl Best on Wednesday, 08/08/20 at 1:30 PM. Patient had no concerns at the end of the call.  Melissa C, CMA is aware that patient has been added.

## 2020-08-08 ENCOUNTER — Ambulatory Visit (INDEPENDENT_AMBULATORY_CARE_PROVIDER_SITE_OTHER): Payer: Medicare HMO | Admitting: Nurse Practitioner

## 2020-08-08 ENCOUNTER — Encounter: Payer: Self-pay | Admitting: Nurse Practitioner

## 2020-08-08 VITALS — BP 132/70 | HR 80 | Ht 65.5 in | Wt 353.0 lb

## 2020-08-08 DIAGNOSIS — R1013 Epigastric pain: Secondary | ICD-10-CM

## 2020-08-08 DIAGNOSIS — R1012 Left upper quadrant pain: Secondary | ICD-10-CM | POA: Diagnosis not present

## 2020-08-08 MED ORDER — DICYCLOMINE HCL 10 MG PO CAPS: 10.0000 mg | ORAL_CAPSULE | Freq: Two times a day (BID) | ORAL | 0 refills | Status: DC

## 2020-08-08 MED ORDER — OMEPRAZOLE 40 MG PO CPDR: 40.0000 mg | DELAYED_RELEASE_CAPSULE | Freq: Every day | ORAL | 1 refills | Status: DC

## 2020-08-08 NOTE — Patient Instructions (Addendum)
If you are age 56 or younger, your body mass index should be between 19-25. Your Body mass index is 57.85 kg/m. If this is out of the aformentioned range listed, please consider follow up with your Primary Care Provider.   MEDICATION: We have sent the following medication to your pharmacy for you to pick up at your convenience: Dicyclomine 10 MG , take 1 tablet twice a day before meals for the next 7 days then take as needed. Omeprazole 40 MG tablet, take 2 tablets a day for 2 weeks then decrease back to once a day.  Please take over the counter probiotic of your choice daily. Miralax- Dissolve one capful in 8 ounces of water and drink before bed.  Please call our office if your symptoms worsen. It was great seeing you today! Thank you for entrusting me with your care and choosing [facility]  Noralyn Pick, CRNP

## 2020-08-08 NOTE — Progress Notes (Signed)
08/08/2020 [name] [id number] 1966-02-21   Chief Complaint:  Abdominal pain, change in bowel pattern   History of Present Illness: [name] is a 55 year old female with a past medical history of obesity, anxiety, arthritis, hypertension, chronic diastolic CHF LV EF 60 - 32% with grade I diastolic dysfunction, CVA, DVT/PE on Xarelto, DM II, CKD, endometrial cancer s/p total abdominal hysterectomy, BSO, cryo reduction and chemotherapy in 2017, diverticulitis with perforation which required a temporary colostomy 2008, Lupus, hiatal hernia, GERD and IBS.   She underwent a screening colonoscopy by Dr. Loletha Carrow on 07/19/2020 which identified a 63mm benign polyp at the splenic flexure and a  70mm tubular adenomatous polyp in the mid rectum which were removed. A repeat a colonoscopy in 3 year was recommended due to a fair bowel prep. She was discharged home in stable condition, however, a few hours post procedure she developed SOB with hemoptysis. She was sent to the ED for further evaluation. In the ED, her temperature was 100.97F. Oxygen saturation 86%. WBC 7.9. A chest xray showed extensive airspace opacity within the left lower lobe concerning for pneumonia (aspiraton vs CAP). CT angiogram chest negative for PE but with asymmetric diffuse bilateral patchy airspace process more consolidated through the left lung and most severe.  Covid testing was negative. She received IV  Antibiotics. Troponin levels were elevated due to demand ischemia per cardiology. Her acute respiratory failure improved and she was discharged home 4/16 on Levaquin x  5 days.   She presents to our office today with complaints of having a squeezing like LUQ pain which started a few days after she as discharged home from the hospital. Eating worsens this pain. No specific food triggers. No NSAIDs. No N/V. She takes Omeprazole 40mg  QD for at least the past year. She underwent an EGD 12/21/2018 which showed a 5cm hiatal  hernia otherwise was normal.   She went 6 days without passing a BM following her colonoscopy. Since then, she is  passing hard round balls of stool or a soft brown stool every 2 days. No diarrhea. She stated her stool smells like she has an infection. She contacted Dr. Loletha Carrow and a C. Diff toxin A/B and GDH tests were ordered, results negative on 08/03/2020. She is taking a gummie probiotic a few days weekly. No rectal bleeding or black stools. She is attempting to lose weight. She is eating small portions. She is drinking a [employer] smoothie most mornings. Possible future bariatric surgery.   No further cough, SOB or hemoptysis.   Past Medical History:  Diagnosis Date  . Allergy   . Anemia   . Anxiety   . Arthritis   . Blood transfusion without reported diagnosis   . Cough   . Cough 06/11/2018  . Difficulty sleeping 06/11/2018  . Diverticulitis with perforation 2010  . DVT (deep vein thrombosis) in pregnancy 11/24/2016  . DVT, lower extremity ([place])   . Endometrial cancer ([place])   . Enlarged lymph nodes 08/13/2017  . Family history of adverse reaction to anesthesia    pts daughter had difficulty awakening following anesthesia, long time to wake up  . GERD (gastroesophageal reflux disease)   . Headache   . Hemoptysis 04/22/2018  . History of bronchitis   . History of ear infections   . Hospital discharge follow-up 03/02/2019  . Hx of migraines   . Hyperlipidemia   . Hypertension   . IBS (irritable bowel syndrome)   .  Kidney infection   . Lupus (systemic lupus erythematosus) ([place]) 02/2017  . Morbid obesity ([place])   . Neck pain 01/13/2019  . Ovarian cancer ([place]) dx'd 01/2015  . PONV (postoperative nausea and vomiting)   . Port-A-Cath in place    Right side  . Portacath in place 09/12/2015  . Pre-diabetes   . Pyelonephritis   . Right knee pain 11/28/2011  . Scleroderma ([place])   . Screen for colon cancer 03/02/2019  . Slow rate of speech 08/22/2015   Chronic from CVA.  She also has loss of  left nasolabial fold and slight left facial droop/small asymmetry on the left side secondary to CVA  . UTI (urinary tract infection) 05/07/2018   Past Surgical History:  Procedure Laterality Date  . ABDOMINAL HYSTERECTOMY N/A 03/13/2015   Procedure: TOTAL HYSTERECTOMY ABDOMINAL BILATERAL SALPINGO OOPHORECTOMY RADICAL TUMOR [place];  Surgeon: Everitt Amber, MD;  [facility];  Service: Gynecology;  Laterality: N/A;  . BOWEL RESECTION  03/13/2015   Procedure: SMALL BOWEL RESECTION;  Surgeon: Everitt Amber, MD;  Location: [facility];  Service: Gynecology;;  . [place] and 1984  . COLONOSCOPY WITH PROPOFOL N/A 07/19/2020   Procedure: COLONOSCOPY WITH PROPOFOL;  Surgeon: Doran Stabler, MD;  [facility];  Service: Gastroenterology;  Laterality: N/A;  . COLOSTOMY  2008  . COLOSTOMY TAKEDOWN    . ESOPHAGOGASTRODUODENOSCOPY (EGD) WITH PROPOFOL N/A 12/21/2018   Procedure: ESOPHAGOGASTRODUODENOSCOPY (EGD) WITH PROPOFOL;  Surgeon: Doran Stabler, MD;  [facility];  Service: Gastroenterology;  Laterality: N/A;  . HEMOSTASIS CLIP PLACEMENT  07/19/2020   Procedure: HEMOSTASIS CLIP PLACEMENT;  Surgeon: Doran Stabler, MD;  [facility];  Service: Gastroenterology;;  . LAPAROTOMY N/A 03/13/2015   Procedure: EXPLORATORY LAPAROTOMY;  Surgeon: Everitt Amber, MD;  [facility];  Service: Gynecology;  Laterality: N/A;  . POLYPECTOMY  07/19/2020   Procedure: POLYPECTOMY;  Surgeon: Doran Stabler, MD;  [facility];  Service: Gastroenterology;;  . [place]  . VENTRAL HERNIA REPAIR  03/13/2015   Procedure: HERNIA REPAIR VENTRAL ADULT;  Surgeon: Everitt Amber, MD;  [facility];  Service: Gynecology;;    CBC Latest Ref Rng & Units 07/26/2020 07/26/2020 07/21/2020  WBC 3.4 - 10.8 x10E3/uL 7.0 7.4 10.8(H)  Hemoglobin 11.1 - 15.9 g/dL - 11.4 9.3(L)  Hematocrit 34.0 - 46.6 % - 34.0 29.0(L)  Platelets 150 - 450 x10E3/uL - 336 154      CMP Latest Ref Rng & Units 07/26/2020 07/21/2020 07/20/2020  Glucose 65 - 99 mg/dL 75 99 96  BUN 6 - 24 mg/dL 15 14 13   Creatinine 0.57 - 1.00 mg/dL 1.15(H) 1.23(H) 0.98  Sodium 134 - 144 mmol/L 141 141 140  Potassium 3.5 - 5.2 mmol/L 4.8 3.2(L) 3.5  Chloride 96 - 106 mmol/L 99 109 112(H)  CO2 20 - 29 mmol/L 20 24 21(L)  Calcium 8.7 - 10.2 mg/dL 9.5 8.5(L) 8.4(L)  Total Protein 6.5 - 8.1 g/dL - - -  Total Bilirubin 0.3 - 1.2 mg/dL - - -  Alkaline Phos 38 - 126 U/L - - -  AST 15 - 41 U/L - - -  ALT 0 - 44 U/L - - -   Current Medications, Allergies, Past Medical History, Past Surgical History, Family History and Social History were reviewed in [employer] record.  Review of Systems:   Constitutional: Negative for fever, sweats, chills or weight loss.  Respiratory: Negative  for shortness of breath.   Cardiovascular: Negative for chest pain, palpitations and leg swelling.  Gastrointestinal: See HPI.  Musculoskeletal: Negative for back pain or muscle aches.  Neurological: Negative for dizziness, headaches or paresthesias.    Physical Exam: BP 132/70   Pulse 80   Ht 5' 5.5" (1.664 m)   Wt (!) 353 lb (160.1 kg)   LMP 10/26/2013   SpO2 98%   BMI 57.85 kg/m  General: Pleasant obese 61 female in no acute distress. Head: Normocephalic and atraumatic. Eyes: No scleral icterus. Conjunctiva pink . Ears: Normal auditory acuity. Mouth: Dentition intact. No ulcers or lesions.  Lungs: Clear throughout to auscultation. Heart: Regular rate and rhythm, no murmur. Abdomen: Soft, nondistended. Mild epigastric and LUQ tenderness. No masses or hepatomegaly. Normal bowel sounds x 4 quadrants. Extensive midline abdominal scar, small horizontal scar to the left mid abdomen.  Rectal: Deferred.  Musculoskeletal: Symmetrical with no gross deformities. Extremities: No edema. Neurological: Alert oriented x 4. No focal deficits.  Psychological: Alert and cooperative. Normal  mood and affect  Assessment and Recommendations:  71. 55 year old female with a change in bowel pattern s/p screening colonoscopy 07/19/2020 identified a 3mm benign polyp at the splenic flexure and a 47mm tubular adenomatous polyp in the mid rectum which were removed. She developed acute respiratory failure with hemoptysis a few hours post colonoscopy and she was admitted to the hospital. A chest xray showed LLL pneumonia, aspiration vs CAP. No overt signs of aspiration during her colonoscopy procedure. She received IV antibiotics and supplemental oxygen and her respiratory status improved. She was discharged home on po Levaquin x 5 days (completed total of 7 days).  No BM x 6 days post colonoscopy then had hard to soft malodorous stools every other day. C. Diff testing on 08/03/2020 was negative. -Probiotic of choice once daily -Miralax Q HS as tolerated to increase stool output, patient instructed to reduce or stop Miralax if stools become too soft or loose -Next colonoscopy with 2 day bowel prep due 07/2023  2. GERD, 5cm hiatal hernia. LUQ pain which started 3 days post hospital discharged which is worse after eating solid foods or drinking water.  -Increase Omeprazole 40mg  po bid x 2 weeks then reduce back to QD if LUQ pain significantly improved  -Dicyclomine 10mg  one po bid PRN, next week take prior to meals -No plans for EGD at this time  -Patient will call our office if symptoms worsen, patient to contact me in 2 weeks with an update   3. Chronic normocytic anemia. No overt GI bleeding. Hg 9.3 -> 11.4. Ferritin 114. Colonoscopy results as noted above. EGD 9/15/20220 showed a 5 cm hiatal hernia without evidence of [place] lesions or any source for her anemia.  -Consider eventual hiatal hernia repair if bariatric surgery further pursued.  -Continue CBC follow up with PCP  4. CKD

## 2020-08-12 NOTE — Progress Notes (Signed)
____________________________________________________________  Attending physician addendum:  Thank you for sending this case to me. I have reviewed the entire note and agree with the plan.  Not clear what about her acute illness triggered these symptoms, but they are not likely to be complications of the procedures and I expect they will gradually resolve.  Wilfrid Lund, MD  ____________________________________________________________

## 2020-08-14 ENCOUNTER — Ambulatory Visit: Payer: Medicare HMO | Admitting: Physical Therapy

## 2020-08-14 ENCOUNTER — Other Ambulatory Visit: Payer: Self-pay

## 2020-08-14 DIAGNOSIS — R2689 Other abnormalities of gait and mobility: Secondary | ICD-10-CM

## 2020-08-14 DIAGNOSIS — M6281 Muscle weakness (generalized): Secondary | ICD-10-CM

## 2020-08-14 DIAGNOSIS — R2681 Unsteadiness on feet: Secondary | ICD-10-CM | POA: Diagnosis not present

## 2020-08-14 NOTE — Therapy (Signed)
[place], [place], [address] Phone: [phone]   Fax:  [phone]  Physical Therapy Treatment  Patient Details  Name: [name] MRN: [id number] Date of Birth: Feb 09, 1966 Referring Provider (PT): Martyn Malay, MD   Encounter Date: 08/14/2020   PT End of Session - 08/14/20 1312    Visit Number 2    Number of Visits 17    Date for PT Re-Evaluation 10/03/20    Authorization Type [employer] MCR - FOTO 6th and 10th    Progress Note Due on Visit 10    PT Start Time 1020    PT Stop Time 1100    PT Time Calculation (min) 40 min    Activity Tolerance Patient tolerated treatment well;Patient limited by fatigue    Behavior During Therapy [facility] for tasks assessed/performed           Past Medical History:  Diagnosis Date  . Allergy   . Anemia   . Anxiety   . Arthritis   . Blood transfusion without reported diagnosis   . Cough   . Cough 06/11/2018  . Difficulty sleeping 06/11/2018  . Diverticulitis with perforation 2010  . DVT (deep vein thrombosis) in pregnancy 11/24/2016  . DVT, lower extremity ([place])   . Endometrial cancer ([place])   . Enlarged lymph nodes 08/13/2017  . Family history of adverse reaction to anesthesia    pts daughter had difficulty awakening following anesthesia, long time to wake up  . GERD (gastroesophageal reflux disease)   . Headache   . Hemoptysis 04/22/2018  . History of bronchitis   . History of ear infections   . Hospital discharge follow-up 03/02/2019  . Hx of migraines   . Hyperlipidemia   . Hypertension   . IBS (irritable bowel syndrome)   . Kidney infection   . Lupus (systemic lupus erythematosus) ([place]) 02/2017  . Morbid obesity ([place])   . Neck pain 01/13/2019  . Ovarian cancer ([place]) dx'd 01/2015  . PONV (postoperative nausea and vomiting)   . Port-A-Cath in place    Right side  . Portacath in place 09/12/2015  . Pre-diabetes   . Pyelonephritis   . Right knee pain  11/28/2011  . Scleroderma ([place])   . Screen for colon cancer 03/02/2019  . Slow rate of speech 08/22/2015   Chronic from CVA.  She also has loss of left nasolabial fold and slight left facial droop/small asymmetry on the left side secondary to CVA  . UTI (urinary tract infection) 05/07/2018    Past Surgical History:  Procedure Laterality Date  . ABDOMINAL HYSTERECTOMY N/A 03/13/2015   Procedure: TOTAL HYSTERECTOMY ABDOMINAL BILATERAL SALPINGO OOPHORECTOMY RADICAL TUMOR [place];  Surgeon: Everitt Amber, MD;  [facility];  Service: Gynecology;  Laterality: N/A;  . BOWEL RESECTION  03/13/2015   Procedure: SMALL BOWEL RESECTION;  Surgeon: Everitt Amber, MD;  [facility];  Service: Gynecology;;  . [place] and 1984  . COLONOSCOPY WITH PROPOFOL N/A 07/19/2020   Procedure: COLONOSCOPY WITH PROPOFOL;  Surgeon: Doran Stabler, MD;  [facility];  Service: Gastroenterology;  Laterality: N/A;  . COLOSTOMY  2008  . COLOSTOMY TAKEDOWN    . ESOPHAGOGASTRODUODENOSCOPY (EGD) WITH PROPOFOL N/A 12/21/2018   Procedure: ESOPHAGOGASTRODUODENOSCOPY (EGD) WITH PROPOFOL;  Surgeon: Doran Stabler, MD;  [facility];  Service: Gastroenterology;  Laterality: N/A;  . HEMOSTASIS CLIP PLACEMENT  07/19/2020   Procedure: HEMOSTASIS CLIP PLACEMENT;  Surgeon: Wilfrid Lund  L III, MD;  [facility];  Service: Gastroenterology;;  . LAPAROTOMY N/A 03/13/2015   Procedure: EXPLORATORY LAPAROTOMY;  Surgeon: Everitt Amber, MD;  [facility];  Service: Gynecology;  Laterality: N/A;  . POLYPECTOMY  07/19/2020   Procedure: POLYPECTOMY;  Surgeon: Doran Stabler, MD;  [facility];  Service: Gastroenterology;;  . [place]  . VENTRAL HERNIA REPAIR  03/13/2015   Procedure: HERNIA REPAIR VENTRAL ADULT;  Surgeon: Everitt Amber, MD;  [facility];  Service: Gynecology;;    There were no vitals filed for this visit.   Subjective Assessment -  08/14/20 1027    Subjective Pt reports nothing new or different from last session. "Trying to get my energy". Pt reports she has been trying to walk for 3 minutes 4x/day.    Pertinent History Numerous and complex PMH - recent hospitalization 07/19/20 - 07/21/20 for pneumonia    Limitations Walking;Standing    How long can you sit comfortably? indefinite    How long can you stand comfortably? 45-60 minutes    How long can you walk comfortably? 5 minutes    Patient Stated Goals pt would like to improve activty tolerance in order to get back to start with aquatic therapy program again    Currently in Pain? No/denies   Mild chronic knee pain                            [facility] Adult PT Treatment/Exercise - 08/14/20 0001      Exercises   Exercises Knee/Hip      Knee/Hip Exercises: Aerobic   Nustep L5 x 7 min keeping at 80-85 steps/min      Knee/Hip Exercises: Standing   Heel Raises 2 sets;20 reps    Hip Flexion Stengthening;2 sets;10 reps    Hip Abduction Stengthening;Both;2 sets;10 reps    Hip Extension Stengthening;Both;2 sets;10 reps      Knee/Hip Exercises: Seated   Other Seated Knee/Hip Exercises Partial squat to chair x10    Sit to Sand 10 reps;without UE support                  PT Education - 08/14/20 1314    Education Details Discussed increasing walking time to 4 minutes, 3x/day (currently 3 minutes, 4x/day). Discussed upgrading HEP    Person(s) Educated Patient    Methods Explanation;Demonstration;Handout    Comprehension Verbalized understanding;Returned demonstration            PT Short Term Goals - 08/06/20 1342      PT SHORT TERM GOAL #1   Title Pt will be independent with initial HEP for improved carryover    Baseline initial HEP given    Time 3    Period Days    Status New    Target Date 08/27/20      PT SHORT TERM GOAL #2   Title Pt will be compliant with walking program for improved endurance and mobility    Time 3    Period  Weeks    Status New    Target Date 08/27/20             PT Long Term Goals - 08/06/20 1343      PT LONG TERM GOAL #1   Title Pt will improve FOTO function score to no less than 61% in order to improve confidence and functional ability    Baseline 36% function    Time  8    Period Weeks    Status New    Target Date 10/01/20      PT LONG TERM GOAL #2   Title Pt will improve reps in 30 Sec Sit to Stand to no less than 12 reps in order to improve functional mobility    Baseline 10 reps    Time 8    Period Weeks    Status New    Target Date 10/01/20      PT LONG TERM GOAL #3   Title Pt will increase distance in 6MWT to no less than 934ft with stable O2 sats in order to improve endurance and community navigation    Baseline see flowsheet    Time 8    Period Weeks    Status New    Target Date 09/17/20                 Plan - 08/14/20 1031    Clinical Impression Statement Focused on endurance and initiating strengthening HEP. Pt tolerated well. Pt increasing her walking time. Discussed progressing her walking program. F/U with aquatics (referral made).    Personal Factors and Comorbidities Comorbidity 3+;Fitness    Comorbidities SLE; HTN; Cancer    Examination-Activity Limitations Stand;Squat;Stairs;Locomotion Level;Carry;Lift;Transfers    Examination-Participation Restrictions Yard Work;Shop;Community Activity    Stability/Clinical Decision Making Stable/Uncomplicated    Rehab Potential Good    PT Frequency 2x / week    PT Duration 8 weeks    PT Treatment/Interventions ADLs/Self Care Home Management;Aquatic Therapy;Electrical Stimulation;Moist Heat;Cryotherapy;Ultrasound;Gait Scientist, forensic;Therapeutic activities;Therapeutic exercise;Functional mobility training;Balance training;Neuromuscular re-education;Patient/family education;Manual techniques    PT Next Visit Plan assess response to HEP exercise ; progress endurance exercises as able. Continue strengthening  as able.    PT Home Exercise Plan Access Code: [email or web address]    Consulted and Agree with Plan of Care Patient           Patient will benefit from skilled therapeutic intervention in order to improve the following deficits and impairments:  Abnormal gait,Decreased activity tolerance,Decreased balance,Decreased endurance,Decreased mobility,Decreased strength,Difficulty walking  Visit Diagnosis: Unsteadiness on feet  Muscle weakness (generalized)  Other abnormalities of gait and mobility     Problem List Patient Active Problem List   Diagnosis Date Noted  . Acute respiratory failure with hypoxia ([place]) 07/19/2020  . Chronic diastolic CHF (congestive heart failure) ([place]) 07/19/2020  . Bronchospasm 07/19/2020  . Elevated troponin 07/19/2020  . Acute nasopharyngitis 05/18/2020  . Incontinence 03/17/2020  . Need for immunization against influenza 01/05/2020  . Herpes simplex virus (HSV) infection of buttock 01/05/2020  . OSA (obstructive sleep apnea) 07/25/2019  . Prediabetes 07/25/2019  . Pneumonia 02/19/2019  . Lateral epicondylitis of left elbow 01/13/2019  . Normocytic anemia 05/29/2018  . Scleroderma ([place]) 04/22/2018  . Cough with hemoptysis 04/22/2018  . Swelling of foot joint, right 04/08/2018  . Positive ANA (antinuclear antibody) 09/23/2016  . Eczema 01/27/2016  . History of pulmonary embolism 09/12/2015  . Long term current use of anticoagulant therapy 09/06/2015  . Chemotherapy-induced peripheral neuropathy ([place]) 06/13/2015  . CKD (chronic kidney disease) stage 3, GFR 30-59 ml/min (H[facility]) 05/12/2015  . GERD (gastroesophageal reflux disease) 05/12/2015  . Ovarian carcinosarcoma, right ([place]) 03/29/2015  . Endometrial ca ([place]) 03/29/2015  . Ventral hernia without obstruction or gangrene   . Morbid obesity ([place]) 02/19/2015  . Essential hypertension, benign   . Stress at home 12/14/2014  . Left knee pain 08/04/2013  . Seasonal allergies 07/29/2013    Shamara Soza April Ma  Sherley Bounds PT, DPT 08/14/2020, 1:19 PM  [facility] [address] [place], [place], [address] Phone: [phone]   Fax:  [phone]  Name: MARTIN SMEAL MRN: [id number] Date of Birth: July 21, 1965

## 2020-08-16 ENCOUNTER — Other Ambulatory Visit: Payer: Self-pay

## 2020-08-16 ENCOUNTER — Ambulatory Visit: Payer: Medicare HMO | Admitting: Physical Therapy

## 2020-08-16 DIAGNOSIS — R2681 Unsteadiness on feet: Secondary | ICD-10-CM

## 2020-08-16 DIAGNOSIS — R2689 Other abnormalities of gait and mobility: Secondary | ICD-10-CM

## 2020-08-16 DIAGNOSIS — M6281 Muscle weakness (generalized): Secondary | ICD-10-CM

## 2020-08-16 NOTE — Therapy (Signed)
[name], [place], [address] Phone: [phone]   Fax:  [phone]  Physical Therapy Treatment  Patient Details  Name: [name] MRN: [id number] Date of Birth: [birth year] Referring Provider (PT): Martyn Malay, MD   Encounter Date: 08/16/2020   PT End of Session - 08/16/20 1104    Visit Number 3    Number of Visits 17    Date for PT Re-Evaluation 10/03/20    Authorization Type [employer] MCR - FOTO 6th and 10th    Progress Note Due on Visit 10    PT Start Time 1015    PT Stop Time 1100    PT Time Calculation (min) 45 min    Activity Tolerance Patient tolerated treatment well;Patient limited by fatigue    Behavior During Therapy [facility] for tasks assessed/performed           Past Medical History:  Diagnosis Date  . Allergy   . Anemia   . Anxiety   . Arthritis   . Blood transfusion without reported diagnosis   . Cough   . Cough 06/11/2018  . Difficulty sleeping 06/11/2018  . Diverticulitis with perforation 2010  . DVT (deep vein thrombosis) in pregnancy 11/24/2016  . DVT, lower extremity ([place])   . Endometrial cancer ([place])   . Enlarged lymph nodes 08/13/2017  . Family history of adverse reaction to anesthesia    pts daughter had difficulty awakening following anesthesia, long time to wake up  . GERD (gastroesophageal reflux disease)   . Headache   . Hemoptysis 04/22/2018  . History of bronchitis   . History of ear infections   . Hospital discharge follow-up 03/02/2019  . Hx of migraines   . Hyperlipidemia   . Hypertension   . IBS (irritable bowel syndrome)   . Kidney infection   . Lupus (systemic lupus erythematosus) ([place]) 02/2017  . Morbid obesity ([place])   . Neck pain 01/13/2019  . Ovarian cancer ([place]) dx'd 01/2015  . PONV (postoperative nausea and vomiting)   . Port-A-Cath in place    Right side  . Portacath in place 09/12/2015  . Pre-diabetes   . Pyelonephritis   . Right knee pain  11/28/2011  . Scleroderma ([place])   . Screen for colon cancer 03/02/2019  . Slow rate of speech 08/22/2015   Chronic from CVA.  She also has loss of left nasolabial fold and slight left facial droop/small asymmetry on the left side secondary to CVA  . UTI (urinary tract infection) 05/07/2018    Past Surgical History:  Procedure Laterality Date  . ABDOMINAL HYSTERECTOMY N/A 03/13/2015   Procedure: TOTAL HYSTERECTOMY ABDOMINAL BILATERAL SALPINGO OOPHORECTOMY RADICAL TUMOR [place];  Surgeon: Everitt Amber, MD;  [facility];  Service: Gynecology;  Laterality: N/A;  . BOWEL RESECTION  03/13/2015   Procedure: SMALL BOWEL RESECTION;  Surgeon: Everitt Amber, MD;  [facility];  Service: Gynecology;;  . [place] and 1984  . COLONOSCOPY WITH PROPOFOL N/A 07/19/2020   Procedure: COLONOSCOPY WITH PROPOFOL;  Surgeon: Doran Stabler, MD;  [facility];  Service: Gastroenterology;  Laterality: N/A;  . COLOSTOMY  2008  . COLOSTOMY TAKEDOWN    . ESOPHAGOGASTRODUODENOSCOPY (EGD) WITH PROPOFOL N/A 12/21/2018   Procedure: ESOPHAGOGASTRODUODENOSCOPY (EGD) WITH PROPOFOL;  Surgeon: Doran Stabler, MD;  [facility];  Service: Gastroenterology;  Laterality: N/A;  . HEMOSTASIS CLIP PLACEMENT  07/19/2020   Procedure: HEMOSTASIS CLIP PLACEMENT;  Surgeon: Wilfrid Lund  L III, MD;  [facility];  Service: Gastroenterology;;  . LAPAROTOMY N/A 03/13/2015   Procedure: EXPLORATORY LAPAROTOMY;  Surgeon: Everitt Amber, MD;  [facility];  Service: Gynecology;  Laterality: N/A;  . POLYPECTOMY  07/19/2020   Procedure: POLYPECTOMY;  Surgeon: Doran Stabler, MD;  [facility];  Service: Gastroenterology;;  . [place]  . VENTRAL HERNIA REPAIR  03/13/2015   Procedure: HERNIA REPAIR VENTRAL ADULT;  Surgeon: Everitt Amber, MD;  [facility];  Service: Gynecology;;    There were no vitals filed for this visit.   Subjective Assessment -  08/16/20 1019    Subjective Pt reports increased R knee pain. Pt states she already put lidocaine cream on it. Pt notes that her HEP has been okay. Forgot to try and increase her walking to 4 minutes.    Pertinent History Numerous and complex PMH - recent hospitalization 07/19/20 - 07/21/20 for pneumonia    Limitations Walking;Standing    How long can you sit comfortably? indefinite    How long can you stand comfortably? 45-60 minutes    How long can you walk comfortably? 5 minutes    Patient Stated Goals pt would like to improve activty tolerance in order to get back to start with aquatic therapy program again    Currently in Pain? Yes    Pain Score 7     Pain Location Knee    Pain Orientation Right                             [facility] Adult PT Treatment/Exercise - 08/16/20 0001      Knee/Hip Exercises: Aerobic   Nustep L5 x 8 min keeping at 80-85 steps/min      Knee/Hip Exercises: Seated   Other Seated Knee/Hip Exercises bicep curl 2x10 yellow tband, tricep curl 2x10 yellow tband, shoulder flexion 2x10 yellow tband, shoulder ER 2x10 yellow tband      Knee/Hip Exercises: Supine   Quad Sets Strengthening;Both;2 sets;10 reps    Bridges Strengthening;Both;2 sets;10 reps    Straight Leg Raises Strengthening;Both;2 sets;10 reps    Other Supine Knee/Hip Exercises Clamshell 2x10 green tband; plantarflexion 2x10 green tband bilat    Other Supine Knee/Hip Exercises PPT with marching 2x10                  PT Education - 08/16/20 1102    Education Details Reinforced increasing walking time. Discussed her new HEP and alternating Upper body and lower body exercises. Demonstrated how to use medbridge app for videos.    Person(s) Educated Patient    Methods Explanation;Demonstration;Handout    Comprehension Verbalized understanding;Returned demonstration;Tactile cues required            PT Short Term Goals - 08/06/20 1342      PT SHORT TERM GOAL #1   Title Pt will  be independent with initial HEP for improved carryover    Baseline initial HEP given    Time 3    Period Days    Status New    Target Date 08/27/20      PT SHORT TERM GOAL #2   Title Pt will be compliant with walking program for improved endurance and mobility    Time 3    Period Weeks    Status New    Target Date 08/27/20             PT Long Term  Goals - 08/06/20 1343      PT LONG TERM GOAL #1   Title Pt will improve FOTO function score to no less than 61% in order to improve confidence and functional ability    Baseline 36% function    Time 8    Period Weeks    Status New    Target Date 10/01/20      PT LONG TERM GOAL #2   Title Pt will improve reps in 30 Sec Sit to Stand to no less than 12 reps in order to improve functional mobility    Baseline 10 reps    Time 8    Period Weeks    Status New    Target Date 10/01/20      PT LONG TERM GOAL #3   Title Pt will increase distance in 6MWT to no less than 918ft with stable O2 sats in order to improve endurance and community navigation    Baseline see flowsheet    Time 8    Period Weeks    Status New    Target Date 09/17/20                 Plan - 08/16/20 1103    Clinical Impression Statement Decreased standing exercises due to pt with increased R knee pain this session -- performed hip strengthening in supine (difficulty tolerating sidelying due to rotator cuff/shoulder issues). Initiated core strengthening and upper body strengthening. Pt with good understanding.    Personal Factors and Comorbidities Comorbidity 3+;Fitness    Comorbidities SLE; HTN; Cancer    Examination-Activity Limitations Stand;Squat;Stairs;Locomotion Level;Carry;Lift;Transfers    Examination-Participation Restrictions Yard Work;Shop;Community Activity    Stability/Clinical Decision Making Stable/Uncomplicated    Rehab Potential Good    PT Frequency 2x / week    PT Duration 8 weeks    PT Treatment/Interventions ADLs/Self Care Home  Management;Aquatic Therapy;Electrical Stimulation;Moist Heat;Cryotherapy;Ultrasound;Gait Scientist, forensic;Therapeutic activities;Therapeutic exercise;Functional mobility training;Balance training;Neuromuscular re-education;Patient/family education;Manual techniques    PT Next Visit Plan assess response to HEP exercise ; progress endurance exercises as able. Continue strengthening as able.    PT Home Exercise Plan Access Code: [email or web address]    Consulted and Agree with Plan of Care Patient           Patient will benefit from skilled therapeutic intervention in order to improve the following deficits and impairments:  Abnormal gait,Decreased activity tolerance,Decreased balance,Decreased endurance,Decreased mobility,Decreased strength,Difficulty walking  Visit Diagnosis: Unsteadiness on feet  Muscle weakness (generalized)  Other abnormalities of gait and mobility     Problem List Patient Active Problem List   Diagnosis Date Noted  . Acute respiratory failure with hypoxia ([place]) 07/19/2020  . Chronic diastolic CHF (congestive heart failure) ([place]) 07/19/2020  . Bronchospasm 07/19/2020  . Elevated troponin 07/19/2020  . Acute nasopharyngitis 05/18/2020  . Incontinence 03/17/2020  . Need for immunization against influenza 01/05/2020  . Herpes simplex virus (HSV) infection of buttock 01/05/2020  . OSA (obstructive sleep apnea) 07/25/2019  . Prediabetes 07/25/2019  . Pneumonia 02/19/2019  . Lateral epicondylitis of left elbow 01/13/2019  . Normocytic anemia 05/29/2018  . Scleroderma ([place]) 04/22/2018  . Cough with hemoptysis 04/22/2018  . Swelling of foot joint, right 04/08/2018  . Positive ANA (antinuclear antibody) 09/23/2016  . Eczema 01/27/2016  . History of pulmonary embolism 09/12/2015  . Long term current use of anticoagulant therapy 09/06/2015  . Chemotherapy-induced peripheral neuropathy ([place]) 06/13/2015  . CKD (chronic kidney disease) stage 3, GFR 30-59 ml/min (HCC)  05/12/2015  . GERD (gastroesophageal  reflux disease) 05/12/2015  . Ovarian carcinosarcoma, right ([place]) 03/29/2015  . Endometrial ca ([place]) 03/29/2015  . Ventral hernia without obstruction or gangrene   . Morbid obesity ([place]) 02/19/2015  . Essential hypertension, benign   . Stress at home 12/14/2014  . Left knee pain 08/04/2013  . Seasonal allergies 07/29/2013    [facility] [address] PT, DPT 08/16/2020, 12:02 PM  [facility] [address] [place], [place], [address] Phone: [phone]   Fax:  [phone]  Name: SKYLAN SAMPAT MRN: [id number] Date of Birth: 27-Sep-1965

## 2020-08-20 ENCOUNTER — Ambulatory Visit: Payer: Medicare HMO

## 2020-08-20 ENCOUNTER — Other Ambulatory Visit: Payer: Self-pay

## 2020-08-20 DIAGNOSIS — R2681 Unsteadiness on feet: Secondary | ICD-10-CM

## 2020-08-20 DIAGNOSIS — M6281 Muscle weakness (generalized): Secondary | ICD-10-CM

## 2020-08-20 DIAGNOSIS — R2689 Other abnormalities of gait and mobility: Secondary | ICD-10-CM

## 2020-08-20 NOTE — Therapy (Signed)
[place], [place], [address] Phone: [phone]   Fax:  [phone]  Physical Therapy Treatment  Patient Details  Name: [name] MRN: [id number] Date of Birth: 03-20-1966 Referring Provider (PT): Martyn Malay, MD   Encounter Date: 08/20/2020   PT End of Session - 08/20/20 0914    Visit Number 4    Number of Visits 17    Date for PT Re-Evaluation 10/03/20    Authorization Type [employer] MCR - FOTO 6th and 10th    Progress Note Due on Visit 10    PT Start Time 0915    PT Stop Time 0956    PT Time Calculation (min) 41 min    Activity Tolerance Patient tolerated treatment well;Patient limited by fatigue    Behavior During Therapy [facility] for tasks assessed/performed           Past Medical History:  Diagnosis Date  . Allergy   . Anemia   . Anxiety   . Arthritis   . Blood transfusion without reported diagnosis   . Cough   . Cough 06/11/2018  . Difficulty sleeping 06/11/2018  . Diverticulitis with perforation 2010  . DVT (deep vein thrombosis) in pregnancy 11/24/2016  . DVT, lower extremity ([place])   . Endometrial cancer ([place])   . Enlarged lymph nodes 08/13/2017  . Family history of adverse reaction to anesthesia    pts daughter had difficulty awakening following anesthesia, long time to wake up  . GERD (gastroesophageal reflux disease)   . Headache   . Hemoptysis 04/22/2018  . History of bronchitis   . History of ear infections   . Hospital discharge follow-up 03/02/2019  . Hx of migraines   . Hyperlipidemia   . Hypertension   . IBS (irritable bowel syndrome)   . Kidney infection   . Lupus (systemic lupus erythematosus) ([place]) 02/2017  . Morbid obesity ([place])   . Neck pain 01/13/2019  . Ovarian cancer ([place]) dx'd 01/2015  . PONV (postoperative nausea and vomiting)   . Port-A-Cath in place    Right side  . Portacath in place 09/12/2015  . Pre-diabetes   . Pyelonephritis   . Right knee pain  11/28/2011  . Scleroderma ([place])   . Screen for colon cancer 03/02/2019  . Slow rate of speech 08/22/2015   Chronic from CVA.  She also has loss of left nasolabial fold and slight left facial droop/small asymmetry on the left side secondary to CVA  . UTI (urinary tract infection) 05/07/2018    Past Surgical History:  Procedure Laterality Date  . ABDOMINAL HYSTERECTOMY N/A 03/13/2015   Procedure: TOTAL HYSTERECTOMY ABDOMINAL BILATERAL SALPINGO OOPHORECTOMY RADICAL TUMOR [place];  Surgeon: Everitt Amber, MD;  [facility];  Service: Gynecology;  Laterality: N/A;  . BOWEL RESECTION  03/13/2015   Procedure: SMALL BOWEL RESECTION;  Surgeon: Everitt Amber, MD;  [facility];  Service: Gynecology;;  . [place] and 1984  . COLONOSCOPY WITH PROPOFOL N/A 07/19/2020   Procedure: COLONOSCOPY WITH PROPOFOL;  Surgeon: Doran Stabler, MD;  [facility];  Service: Gastroenterology;  Laterality: N/A;  . COLOSTOMY  2008  . COLOSTOMY TAKEDOWN    . ESOPHAGOGASTRODUODENOSCOPY (EGD) WITH PROPOFOL N/A 12/21/2018   Procedure: ESOPHAGOGASTRODUODENOSCOPY (EGD) WITH PROPOFOL;  Surgeon: Doran Stabler, MD;  [facility];  Service: Gastroenterology;  Laterality: N/A;  . HEMOSTASIS CLIP PLACEMENT  07/19/2020   Procedure: HEMOSTASIS CLIP PLACEMENT;  Surgeon: Wilfrid Lund  L III, MD;  [facility];  Service: Gastroenterology;;  . LAPAROTOMY N/A 03/13/2015   Procedure: EXPLORATORY LAPAROTOMY;  Surgeon: Everitt Amber, MD;  [facility];  Service: Gynecology;  Laterality: N/A;  . POLYPECTOMY  07/19/2020   Procedure: POLYPECTOMY;  Surgeon: Doran Stabler, MD;  [facility];  Service: Gastroenterology;;  . [place]  . VENTRAL HERNIA REPAIR  03/13/2015   Procedure: HERNIA REPAIR VENTRAL ADULT;  Surgeon: Everitt Amber, MD;  [facility];  Service: Gynecology;;    There were no vitals filed for this visit.   Subjective Assessment -  08/20/20 0915    Subjective Pt presents to PT with reports of increased R knee pain. "My right knee is bone on bone, so since I've been doing more it's started hurting". Pt increased her walking distance to 4 minutes with no adverse effect. Pt is ready to begin PT treatment at this time.    Currently in Pain? Yes    Pain Score 6     Pain Location Knee    Pain Orientation Right    Pain Type Acute pain                             [facility] Adult PT Treatment/Exercise - 08/20/20 0001      Knee/Hip Exercises: Aerobic   Nustep L5 UE/LE x 6 min while taking subjective - 80/85 steps/min      Knee/Hip Exercises: Standing   Hip Abduction 2 sets;10 reps;Both    Abduction Limitations red tband    Hip Extension 2 sets;10 reps;Both    Extension Limitations red tband    Other Standing Knee Exercises lateral walk red tband x 3 laps at counter      Knee/Hip Exercises: Seated   Long Arc Quad 2 sets;10 reps;Both    Long Arc Quad Limitations 5 sec hold    Hamstring Curl 2 sets;10 reps;Both    Hamstring Limitations green tband    Sit to Sand 2 sets;5 reps;without UE support      Knee/Hip Exercises: Supine   Hip Adduction Isometric 2 sets;10 reps;Both    Hip Adduction Isometric Limitations 5 sec hold    Bridges 2 sets;10 reps;Strengthening;Both    Straight Leg Raises 2 sets;10 reps;Both    Other Supine Knee/Hip Exercises clamshell blue tband 2x15    Other Supine Knee/Hip Exercises marching with blue tband 2x10                    PT Short Term Goals - 08/06/20 1342      PT SHORT TERM GOAL #1   Title Pt will be independent with initial HEP for improved carryover    Baseline initial HEP given    Time 3    Period Days    Status New    Target Date 08/27/20      PT SHORT TERM GOAL #2   Title Pt will be compliant with walking program for improved endurance and mobility    Time 3    Period Weeks    Status New    Target Date 08/27/20             PT Long Term  Goals - 08/06/20 1343      PT LONG TERM GOAL #1   Title Pt will improve FOTO function score to no less than 61% in order to improve confidence and functional ability  Baseline 36% function    Time 8    Period Weeks    Status New    Target Date 10/01/20      PT LONG TERM GOAL #2   Title Pt will improve reps in 30 Sec Sit to Stand to no less than 12 reps in order to improve functional mobility    Baseline 10 reps    Time 8    Period Weeks    Status New    Target Date 10/01/20      PT LONG TERM GOAL #3   Title Pt will increase distance in 6MWT to no less than 962ft with stable O2 sats in order to improve endurance and community navigation    Baseline see flowsheet    Time 8    Period Weeks    Status New    Target Date 09/17/20                 Plan - 08/20/20 0944    Clinical Impression Statement Pt was once again able to complete all prescribed exercises with no adverse effect or increase in pain. Her SpO2 remainted stable throughout therapy and she was able to progress exercises, showing improving strength and activity tolerance. PT advised pt to continue to increase distance and time with walking program by 1 minute intervals. Will continue to progress as tolerated per POC as prescribed.    Personal Factors and Comorbidities Comorbidity 3+;Fitness    Comorbidities SLE; HTN; Cancer    Examination-Activity Limitations Stand;Squat;Stairs;Locomotion Level;Carry;Lift;Transfers    Examination-Participation Restrictions Yard Work;Shop;Community Activity    Stability/Clinical Decision Making Stable/Uncomplicated    Rehab Potential Good    PT Frequency 2x / week    PT Duration 8 weeks    PT Treatment/Interventions ADLs/Self Care Home Management;Aquatic Therapy;Electrical Stimulation;Moist Heat;Cryotherapy;Ultrasound;Gait Scientist, forensic;Therapeutic activities;Therapeutic exercise;Functional mobility training;Balance training;Neuromuscular re-education;Patient/family  education;Manual techniques    PT Next Visit Plan assess response to HEP exercise ; progress endurance exercises as able. Continue strengthening as able.    PT Home Exercise Plan Access Code: [email or web address]    Consulted and Agree with Plan of Care Patient           Patient will benefit from skilled therapeutic intervention in order to improve the following deficits and impairments:  Abnormal gait,Decreased activity tolerance,Decreased balance,Decreased endurance,Decreased mobility,Decreased strength,Difficulty walking  Visit Diagnosis: Unsteadiness on feet  Muscle weakness (generalized)  Other abnormalities of gait and mobility     Problem List Patient Active Problem List   Diagnosis Date Noted  . Acute respiratory failure with hypoxia ([place]) 07/19/2020  . Chronic diastolic CHF (congestive heart failure) ([place]) 07/19/2020  . Bronchospasm 07/19/2020  . Elevated troponin 07/19/2020  . Acute nasopharyngitis 05/18/2020  . Incontinence 03/17/2020  . Need for immunization against influenza 01/05/2020  . Herpes simplex virus (HSV) infection of buttock 01/05/2020  . OSA (obstructive sleep apnea) 07/25/2019  . Prediabetes 07/25/2019  . Pneumonia 02/19/2019  . Lateral epicondylitis of left elbow 01/13/2019  . Normocytic anemia 05/29/2018  . Scleroderma ([place]) 04/22/2018  . Cough with hemoptysis 04/22/2018  . Swelling of foot joint, right 04/08/2018  . Positive ANA (antinuclear antibody) 09/23/2016  . Eczema 01/27/2016  . History of pulmonary embolism 09/12/2015  . Long term current use of anticoagulant therapy 09/06/2015  . Chemotherapy-induced peripheral neuropathy ([place]) 06/13/2015  . CKD (chronic kidney disease) stage 3, GFR 30-59 ml/min (HCC) 05/12/2015  . GERD (gastroesophageal reflux disease) 05/12/2015  . Ovarian carcinosarcoma, right ([place]) 03/29/2015  . Endometrial ca (  [place]) 03/29/2015  . Ventral hernia without obstruction or gangrene   . Morbid obesity ([place]) 02/19/2015  .  Essential hypertension, benign   . Stress at home 12/14/2014  . Left knee pain 08/04/2013  . Seasonal allergies 07/29/2013    Ward Chatters, PT, DPT 08/20/20 10:00 AM  [facility] [address] [place], [place], [address] Phone: [phone]   Fax:  [phone]  Name: [name] MRN: [id number] Date of Birth: March 13, 1966

## 2020-08-22 ENCOUNTER — Ambulatory Visit: Payer: Medicare HMO

## 2020-08-22 ENCOUNTER — Other Ambulatory Visit: Payer: Self-pay

## 2020-08-22 DIAGNOSIS — R2681 Unsteadiness on feet: Secondary | ICD-10-CM | POA: Diagnosis not present

## 2020-08-22 DIAGNOSIS — M6281 Muscle weakness (generalized): Secondary | ICD-10-CM

## 2020-08-22 DIAGNOSIS — R2689 Other abnormalities of gait and mobility: Secondary | ICD-10-CM

## 2020-08-22 NOTE — Therapy (Signed)
[place], [place], [address] Phone: [phone]   Fax:  [phone]  Physical Therapy Treatment  Patient Details  Name: [name] MRN: [id number] Date of Birth: 01/12/1966 Referring Provider (PT): Martyn Malay, MD   Encounter Date: 08/22/2020   PT End of Session - 08/22/20 0911    Visit Number 5    Number of Visits 17    Date for PT Re-Evaluation 10/03/20    Authorization Type [employer] MCR - FOTO 6th and 10th    Progress Note Due on Visit 10    PT Start Time 0915    PT Stop Time 0956    PT Time Calculation (min) 41 min    Activity Tolerance Patient tolerated treatment well;Patient limited by fatigue    Behavior During Therapy [facility] for tasks assessed/performed           Past Medical History:  Diagnosis Date  . Allergy   . Anemia   . Anxiety   . Arthritis   . Blood transfusion without reported diagnosis   . Cough   . Cough 06/11/2018  . Difficulty sleeping 06/11/2018  . Diverticulitis with perforation 2010  . DVT (deep vein thrombosis) in pregnancy 11/24/2016  . DVT, lower extremity ([place])   . Endometrial cancer ([place])   . Enlarged lymph nodes 08/13/2017  . Family history of adverse reaction to anesthesia    pts daughter had difficulty awakening following anesthesia, long time to wake up  . GERD (gastroesophageal reflux disease)   . Headache   . Hemoptysis 04/22/2018  . History of bronchitis   . History of ear infections   . Hospital discharge follow-up 03/02/2019  . Hx of migraines   . Hyperlipidemia   . Hypertension   . IBS (irritable bowel syndrome)   . Kidney infection   . Lupus (systemic lupus erythematosus) ([place]) 02/2017  . Morbid obesity ([place])   . Neck pain 01/13/2019  . Ovarian cancer ([place]) dx'd 01/2015  . PONV (postoperative nausea and vomiting)   . Port-A-Cath in place    Right side  . Portacath in place 09/12/2015  . Pre-diabetes   . Pyelonephritis   . Right knee pain  11/28/2011  . Scleroderma ([place])   . Screen for colon cancer 03/02/2019  . Slow rate of speech 08/22/2015   Chronic from CVA.  She also has loss of left nasolabial fold and slight left facial droop/small asymmetry on the left side secondary to CVA  . UTI (urinary tract infection) 05/07/2018    Past Surgical History:  Procedure Laterality Date  . ABDOMINAL HYSTERECTOMY N/A 03/13/2015   Procedure: TOTAL HYSTERECTOMY ABDOMINAL BILATERAL SALPINGO OOPHORECTOMY RADICAL TUMOR [place];  Surgeon: Everitt Amber, MD;  [facility];  Service: Gynecology;  Laterality: N/A;  . BOWEL RESECTION  03/13/2015   Procedure: SMALL BOWEL RESECTION;  Surgeon: Everitt Amber, MD;  [facility];  Service: Gynecology;;  . [place] and 1984  . COLONOSCOPY WITH PROPOFOL N/A 07/19/2020   Procedure: COLONOSCOPY WITH PROPOFOL;  Surgeon: Doran Stabler, MD;  [facility];  Service: Gastroenterology;  Laterality: N/A;  . COLOSTOMY  2008  . COLOSTOMY TAKEDOWN    . ESOPHAGOGASTRODUODENOSCOPY (EGD) WITH PROPOFOL N/A 12/21/2018   Procedure: ESOPHAGOGASTRODUODENOSCOPY (EGD) WITH PROPOFOL;  Surgeon: Doran Stabler, MD;  [facility];  Service: Gastroenterology;  Laterality: N/A;  . HEMOSTASIS CLIP PLACEMENT  07/19/2020   Procedure: HEMOSTASIS CLIP PLACEMENT;  Surgeon: Wilfrid Lund  L III, MD;  [facility];  Service: Gastroenterology;;  . LAPAROTOMY N/A 03/13/2015   Procedure: EXPLORATORY LAPAROTOMY;  Surgeon: Everitt Amber, MD;  [facility];  Service: Gynecology;  Laterality: N/A;  . POLYPECTOMY  07/19/2020   Procedure: POLYPECTOMY;  Surgeon: Doran Stabler, MD;  [facility];  Service: Gastroenterology;;  . [place]  . VENTRAL HERNIA REPAIR  03/13/2015   Procedure: HERNIA REPAIR VENTRAL ADULT;  Surgeon: Everitt Amber, MD;  [facility];  Service: Gynecology;;    There were no vitals filed for this visit.   Subjective Assessment -  08/22/20 0913    Subjective Pt presents to PT with reports of continued R knee pain. She was able to increase her walking distance and time to 7 minutes with no adverse effect. Pt is ready to begin PT treatment at this time.    Currently in Pain? Yes    Pain Score 7     Pain Location Knee    Pain Orientation Right;Left                             [facility] Adult PT Treatment/Exercise - 08/22/20 0001      Knee/Hip Exercises: Aerobic   Nustep L5 UE/LE x 6 min while taking subjective - 80/85 steps/min      Knee/Hip Exercises: Standing   Hip Abduction 2 sets;10 reps;Both    Abduction Limitations red tband    Hip Extension 2 sets;10 reps;Both    Extension Limitations red tband    Other Standing Knee Exercises lateral walk red tband x 3 laps at counter      Knee/Hip Exercises: Seated   Long Arc Quad 2 sets;10 reps;Both    Long Arc Quad Weight 3 lbs.    Long Arc Quad Limitations 5 sec hold    Hamstring Curl 2 sets;10 reps;Both    Hamstring Limitations black tband    Sit to Sand 2 sets;10 reps;without UE support   slight increase in R knee pain     Knee/Hip Exercises: Supine   Hip Adduction Isometric 2 sets;10 reps;Both    Hip Adduction Isometric Limitations 5 sec hold    Bridges 2 sets;10 reps;Strengthening;Both    Straight Leg Raises 2 sets;10 reps;Both    Other Supine Knee/Hip Exercises clamshell black tband 2x15    Other Supine Knee/Hip Exercises marching with black tband 2x10                    PT Short Term Goals - 08/06/20 1342      PT SHORT TERM GOAL #1   Title Pt will be independent with initial HEP for improved carryover    Baseline initial HEP given    Time 3    Period Days    Status New    Target Date 08/27/20      PT SHORT TERM GOAL #2   Title Pt will be compliant with walking program for improved endurance and mobility    Time 3    Period Weeks    Status New    Target Date 08/27/20             PT Long Term Goals - 08/06/20  1343      PT LONG TERM GOAL #1   Title Pt will improve FOTO function score to no less than 61% in order to improve confidence and functional ability    Baseline  36% function    Time 8    Period Weeks    Status New    Target Date 10/01/20      PT LONG TERM GOAL #2   Title Pt will improve reps in 30 Sec Sit to Stand to no less than 12 reps in order to improve functional mobility    Baseline 10 reps    Time 8    Period Weeks    Status New    Target Date 10/01/20      PT LONG TERM GOAL #3   Title Pt will increase distance in 6MWT to no less than 936ft with stable O2 sats in order to improve endurance and community navigation    Baseline see flowsheet    Time 8    Period Weeks    Status New    Target Date 09/17/20                 Plan - 08/22/20 0951    Clinical Impression Statement Pt was able to complete prescribed exercises but had continued bilateral knee pain during session. She continues to progress her LE strength and endurance, with PT progressing walking program to 10 minutes of consecutive activity while monitoring symptoms and O2 sats. Will continue to treat and progress per POC as prescribed as tolerated.    Personal Factors and Comorbidities Comorbidity 3+;Fitness    Comorbidities SLE; HTN; Cancer    Examination-Activity Limitations Stand;Squat;Stairs;Locomotion Level;Carry;Lift;Transfers    Examination-Participation Restrictions Yard Work;Shop;Community Activity    Stability/Clinical Decision Making Stable/Uncomplicated    Rehab Potential Good    PT Frequency 2x / week    PT Duration 8 weeks    PT Treatment/Interventions ADLs/Self Care Home Management;Aquatic Therapy;Electrical Stimulation;Moist Heat;Cryotherapy;Ultrasound;Gait Scientist, forensic;Therapeutic activities;Therapeutic exercise;Functional mobility training;Balance training;Neuromuscular re-education;Patient/family education;Manual techniques    PT Next Visit Plan assess response to HEP exercise  ; progress endurance exercises as able. Continue strengthening as able.    PT Home Exercise Plan Access Code: [email or web address]    Consulted and Agree with Plan of Care Patient           Patient will benefit from skilled therapeutic intervention in order to improve the following deficits and impairments:  Abnormal gait,Decreased activity tolerance,Decreased balance,Decreased endurance,Decreased mobility,Decreased strength,Difficulty walking  Visit Diagnosis: Unsteadiness on feet  Muscle weakness (generalized)  Other abnormalities of gait and mobility     Problem List Patient Active Problem List   Diagnosis Date Noted  . Acute respiratory failure with hypoxia ([place]) 07/19/2020  . Chronic diastolic CHF (congestive heart failure) ([place]) 07/19/2020  . Bronchospasm 07/19/2020  . Elevated troponin 07/19/2020  . Acute nasopharyngitis 05/18/2020  . Incontinence 03/17/2020  . Need for immunization against influenza 01/05/2020  . Herpes simplex virus (HSV) infection of buttock 01/05/2020  . OSA (obstructive sleep apnea) 07/25/2019  . Prediabetes 07/25/2019  . Pneumonia 02/19/2019  . Lateral epicondylitis of left elbow 01/13/2019  . Normocytic anemia 05/29/2018  . Scleroderma ([place]) 04/22/2018  . Cough with hemoptysis 04/22/2018  . Swelling of foot joint, right 04/08/2018  . Positive ANA (antinuclear antibody) 09/23/2016  . Eczema 01/27/2016  . History of pulmonary embolism 09/12/2015  . Long term current use of anticoagulant therapy 09/06/2015  . Chemotherapy-induced peripheral neuropathy ([place]) 06/13/2015  . CKD (chronic kidney disease) stage 3, GFR 30-59 ml/min (HCC) 05/12/2015  . GERD (gastroesophageal reflux disease) 05/12/2015  . Ovarian carcinosarcoma, right ([place]) 03/29/2015  . Endometrial ca ([place]) 03/29/2015  . Ventral hernia without obstruction or gangrene   .  Morbid obesity ([place]) 02/19/2015  . Essential hypertension, benign   . Stress at home 12/14/2014  . Left knee pain  08/04/2013  . Seasonal allergies 07/29/2013    Ward Chatters, PT, DPT 08/22/20 11:04 AM  [facility] [address] [place], [place], [address] Phone: [phone]   Fax:  [phone]  Name: RUBLE CHMURA MRN: [id number] Date of Birth: 01/05/1966

## 2020-08-27 ENCOUNTER — Ambulatory Visit: Payer: Medicare HMO

## 2020-08-27 ENCOUNTER — Other Ambulatory Visit: Payer: Self-pay | Admitting: Internal Medicine

## 2020-08-27 ENCOUNTER — Other Ambulatory Visit: Payer: Self-pay

## 2020-08-27 ENCOUNTER — Other Ambulatory Visit: Payer: Self-pay | Admitting: Family Medicine

## 2020-08-27 DIAGNOSIS — R2689 Other abnormalities of gait and mobility: Secondary | ICD-10-CM

## 2020-08-27 DIAGNOSIS — R2681 Unsteadiness on feet: Secondary | ICD-10-CM | POA: Diagnosis not present

## 2020-08-27 DIAGNOSIS — M6281 Muscle weakness (generalized): Secondary | ICD-10-CM

## 2020-08-27 NOTE — Therapy (Signed)
[place], [place], [address] Phone: [phone]   Fax:  [phone]  Physical Therapy Treatment  Patient Details  Name: [name] MRN: [id number] Date of Birth: June 17, 1965 Referring Provider (PT): Martyn Malay, MD   Encounter Date: 08/27/2020   PT End of Session - 08/27/20 0914    Visit Number 6    Number of Visits 17    Date for PT Re-Evaluation 10/03/20    Authorization Type [employer] MCR - FOTO 6th and 10th    Progress Note Due on Visit 10    PT Start Time 0915    PT Stop Time 0954    PT Time Calculation (min) 39 min    Activity Tolerance Patient tolerated treatment well;Patient limited by fatigue    Behavior During Therapy [facility] for tasks assessed/performed           Past Medical History:  Diagnosis Date  . Allergy   . Anemia   . Anxiety   . Arthritis   . Blood transfusion without reported diagnosis   . Cough   . Cough 06/11/2018  . Difficulty sleeping 06/11/2018  . Diverticulitis with perforation 2010  . DVT (deep vein thrombosis) in pregnancy 11/24/2016  . DVT, lower extremity ([place])   . Endometrial cancer ([place])   . Enlarged lymph nodes 08/13/2017  . Family history of adverse reaction to anesthesia    pts daughter had difficulty awakening following anesthesia, long time to wake up  . GERD (gastroesophageal reflux disease)   . Headache   . Hemoptysis 04/22/2018  . History of bronchitis   . History of ear infections   . Hospital discharge follow-up 03/02/2019  . Hx of migraines   . Hyperlipidemia   . Hypertension   . IBS (irritable bowel syndrome)   . Kidney infection   . Lupus (systemic lupus erythematosus) ([place]) 02/2017  . Morbid obesity ([place])   . Neck pain 01/13/2019  . Ovarian cancer ([place]) dx'd 01/2015  . PONV (postoperative nausea and vomiting)   . Port-A-Cath in place    Right side  . Portacath in place 09/12/2015  . Pre-diabetes   . Pyelonephritis   . Right knee pain  11/28/2011  . Scleroderma ([place])   . Screen for colon cancer 03/02/2019  . Slow rate of speech 08/22/2015   Chronic from CVA.  She also has loss of left nasolabial fold and slight left facial droop/small asymmetry on the left side secondary to CVA  . UTI (urinary tract infection) 05/07/2018    Past Surgical History:  Procedure Laterality Date  . ABDOMINAL HYSTERECTOMY N/A 03/13/2015   Procedure: TOTAL HYSTERECTOMY ABDOMINAL BILATERAL SALPINGO OOPHORECTOMY RADICAL TUMOR [place];  Surgeon: Everitt Amber, MD;  [facility];  Service: Gynecology;  Laterality: N/A;  . BOWEL RESECTION  03/13/2015   Procedure: SMALL BOWEL RESECTION;  Surgeon: Everitt Amber, MD;  [facility];  Service: Gynecology;;  . [place] and 1984  . COLONOSCOPY WITH PROPOFOL N/A 07/19/2020   Procedure: COLONOSCOPY WITH PROPOFOL;  Surgeon: Doran Stabler, MD;  [facility];  Service: Gastroenterology;  Laterality: N/A;  . COLOSTOMY  2008  . COLOSTOMY TAKEDOWN    . ESOPHAGOGASTRODUODENOSCOPY (EGD) WITH PROPOFOL N/A 12/21/2018   Procedure: ESOPHAGOGASTRODUODENOSCOPY (EGD) WITH PROPOFOL;  Surgeon: Doran Stabler, MD;  [facility];  Service: Gastroenterology;  Laterality: N/A;  . HEMOSTASIS CLIP PLACEMENT  07/19/2020   Procedure: HEMOSTASIS CLIP PLACEMENT;  Surgeon: Wilfrid Lund  L III, MD;  [facility];  Service: Gastroenterology;;  . LAPAROTOMY N/A 03/13/2015   Procedure: EXPLORATORY LAPAROTOMY;  Surgeon: Everitt Amber, MD;  [facility];  Service: Gynecology;  Laterality: N/A;  . POLYPECTOMY  07/19/2020   Procedure: POLYPECTOMY;  Surgeon: Doran Stabler, MD;  [facility];  Service: Gastroenterology;;  . [place]  . VENTRAL HERNIA REPAIR  03/13/2015   Procedure: HERNIA REPAIR VENTRAL ADULT;  Surgeon: Everitt Amber, MD;  [facility];  Service: Gynecology;;    There were no vitals filed for this visit.   Subjective Assessment -  08/27/20 0914    Subjective Pt presents to PT with reports of bilateral foot and ankle swelling. She has a visit tomorrow with cardiology to address this. Pt is ready to begin PT treatment treatment at this time.    Currently in Pain? Yes    Pain Score 5     Pain Orientation Right;Left              [facility] PT Assessment - 08/27/20 0001      Observation/Other Assessments   Focus on Therapeutic Outcomes (FOTO)  41% function                         [facility] Adult PT Treatment/Exercise - 08/27/20 0001      Knee/Hip Exercises: Aerobic   Nustep L5 UE/LE x 5 min while taking subjective - 80/85 steps/min      Knee/Hip Exercises: Standing   Heel Raises 20 reps    Hip Abduction 2 sets;10 reps;Both    Abduction Limitations red tband    Hip Extension 2 sets;10 reps;Both    Extension Limitations red tband    Functional Squat 2 sets;10 reps   handhold support at freemotion   Other Standing Knee Exercises lateral walk red tband x 3 laps at counter      Knee/Hip Exercises: Seated   Long Arc Quad 2 sets;10 reps;Both    Long Arc Quad Weight 3 lbs.    Long Arc Quad Limitations 5 sec hold    Hamstring Curl 2 sets;10 reps;Both    Hamstring Limitations black tband    Sit to Sand 10 reps;without UE support      Knee/Hip Exercises: Supine   Bridges 2 sets;10 reps;Strengthening;Both    Straight Leg Raises 2 sets;10 reps;Both                    PT Short Term Goals - 08/27/20 0955      PT SHORT TERM GOAL #1   Title Pt will be independent with initial HEP for improved carryover    Baseline initial HEP given    Time 3    Period Days    Status Achieved    Target Date 08/27/20      PT SHORT TERM GOAL #2   Title Pt will be compliant with walking program for improved endurance and mobility    Time 3    Period Weeks    Status Achieved    Target Date 08/27/20             PT Long Term Goals - 08/27/20 0954      PT LONG TERM GOAL #1   Title Pt will improve FOTO  function score to no less than 61% in order to improve confidence and functional ability    Baseline 36% function; updated - 41%    Time  8    Period Weeks    Status New    Target Date 10/01/20      PT LONG TERM GOAL #2   Title Pt will improve reps in 30 Sec Sit to Stand to no less than 12 reps in order to improve functional mobility    Baseline 10 reps    Time 8    Period Weeks    Status New    Target Date 10/01/20      PT LONG TERM GOAL #3   Title Pt will increase distance in 6MWT to no less than 923ft with stable O2 sats in order to improve endurance and community navigation    Baseline see flowsheet    Time 8    Period Weeks    Status New    Target Date 10/01/20                 Plan - 08/27/20 0937    Clinical Impression Statement Pt was able to complete prescribed exercises with no adverse effects noted. Her FOTO score has improved since initial evaluation, up to 41% function showing improving confidence and functional ability. Overall she is continuing to progress well with therapy and walking program and should continue to be seen per POC as prescribed and progressed as able.    Personal Factors and Comorbidities Comorbidity 3+;Fitness    Comorbidities SLE; HTN; Cancer    Examination-Activity Limitations Stand;Squat;Stairs;Locomotion Level;Carry;Lift;Transfers    Examination-Participation Restrictions Yard Work;Shop;Community Activity    Stability/Clinical Decision Making Stable/Uncomplicated    Rehab Potential Good    PT Frequency 2x / week    PT Duration 8 weeks    PT Treatment/Interventions ADLs/Self Care Home Management;Aquatic Therapy;Electrical Stimulation;Moist Heat;Cryotherapy;Ultrasound;Gait Scientist, forensic;Therapeutic activities;Therapeutic exercise;Functional mobility training;Balance training;Neuromuscular re-education;Patient/family education;Manual techniques    PT Next Visit Plan assess response to HEP exercise ; progress endurance exercises as  able. Continue strengthening as able.    PT Home Exercise Plan Access Code: [email or web address]    Consulted and Agree with Plan of Care Patient           Patient will benefit from skilled therapeutic intervention in order to improve the following deficits and impairments:  Abnormal gait,Decreased activity tolerance,Decreased balance,Decreased endurance,Decreased mobility,Decreased strength,Difficulty walking  Visit Diagnosis: Unsteadiness on feet  Muscle weakness (generalized)  Other abnormalities of gait and mobility     Problem List Patient Active Problem List   Diagnosis Date Noted  . Acute respiratory failure with hypoxia ([place]) 07/19/2020  . Chronic diastolic CHF (congestive heart failure) ([place]) 07/19/2020  . Bronchospasm 07/19/2020  . Elevated troponin 07/19/2020  . Acute nasopharyngitis 05/18/2020  . Incontinence 03/17/2020  . Need for immunization against influenza 01/05/2020  . Herpes simplex virus (HSV) infection of buttock 01/05/2020  . OSA (obstructive sleep apnea) 07/25/2019  . Prediabetes 07/25/2019  . Pneumonia 02/19/2019  . Lateral epicondylitis of left elbow 01/13/2019  . Normocytic anemia 05/29/2018  . Scleroderma ([place]) 04/22/2018  . Cough with hemoptysis 04/22/2018  . Swelling of foot joint, right 04/08/2018  . Positive ANA (antinuclear antibody) 09/23/2016  . Eczema 01/27/2016  . History of pulmonary embolism 09/12/2015  . Long term current use of anticoagulant therapy 09/06/2015  . Chemotherapy-induced peripheral neuropathy ([place]) 06/13/2015  . CKD (chronic kidney disease) stage 3, GFR 30-59 ml/min (HCC) 05/12/2015  . GERD (gastroesophageal reflux disease) 05/12/2015  . Ovarian carcinosarcoma, right ([place]) 03/29/2015  . Endometrial ca ([place]) 03/29/2015  . Ventral hernia without obstruction or gangrene   . Morbid obesity (  [place]) 02/19/2015  . Essential hypertension, benign   . Stress at home 12/14/2014  . Left knee pain 08/04/2013  . Seasonal allergies  07/29/2013    Ward Chatters, PT, DPT 08/27/20 9:55 AM  [facility] [address] [place], [place], [address] Phone: [phone]   Fax:  [phone]  Name: [name] MRN: [id number] Date of Birth: Jul 29, 1965

## 2020-08-27 NOTE — Progress Notes (Signed)
Cardiology Office Note:    Date:  08/28/2020   ID:  SHEELA MCCULLEY, DOB [birth year], MRN [id number]  PCP:  Leeanne Rio, MD  Cardiologist:  Elouise Munroe, MD   Referring MD: Leeanne Rio, MD   Chief Complaint  Patient presents with  . Hospitalization Follow-up    PNA, chest pain, elevated troponin consistent with demand ischemic  hospital follow up for PNA, CP  History of Present Illness:    [name] is a 55 y.o. female with a hx of morbid obesity, OSA on CPAP, DM2, endometrial and ovarian cancer s/p surgery and chemotherapy, HTN, HLD, DVT/PE on xarelto and recent CAP. She was seen by Dr. Margaretann Loveless 07/12/20 and was awaiting upcoming colonoscopy for GI symptoms including vomiting after some meals. She underwent colonoscopy on 07/20/20 and was told she was coughing during the procedure. On the way home, she was SOB with chest pressure. She presented to [facility]. She was hypoxic, tachycardic, and had a mild troponin elevation. She also reported hemoptysis. CTA negative for recurrent PE, but did show LLL PNA with questionable aspiration. Cardiology was consulted. HST elevation felt due to demand ischemia in the setting of hypoxia and tachycardia. No additional cardiac testing at that time, but if symptoms persisted, she may require OP stress testing.   She presents for follow up. She has finished ABX and feeling much better, no further fatigue. She struggled with movement while sick and is now in PT. She can now walk 7 min without stopping 2-3 times daily. She is also starting water aerobics. No further chest pain. She takes lasix PRN, but has not taken in 1 year. A1c 5.5%. She is losing weight - lost from 398 --> 371 lbs. She is considering bariatric surgery with [facility]. She does report one day of lower extremity swelling associated with a day in the heat and being on her feet all day (family in town).    Past Medical History:  Diagnosis Date  . Allergy   . Anemia   .  Anxiety   . Arthritis   . Blood transfusion without reported diagnosis   . Cough   . Cough 06/11/2018  . Difficulty sleeping 06/11/2018  . Diverticulitis with perforation 2010  . DVT (deep vein thrombosis) in pregnancy 11/24/2016  . DVT, lower extremity ([place])   . Endometrial cancer ([place])   . Enlarged lymph nodes 08/13/2017  . Family history of adverse reaction to anesthesia    pts daughter had difficulty awakening following anesthesia, long time to wake up  . GERD (gastroesophageal reflux disease)   . Headache   . Hemoptysis 04/22/2018  . History of bronchitis   . History of ear infections   . Hospital discharge follow-up 03/02/2019  . Hx of migraines   . Hyperlipidemia   . Hypertension   . IBS (irritable bowel syndrome)   . Kidney infection   . Lupus (systemic lupus erythematosus) ([place]) 02/2017  . Morbid obesity ([place])   . Neck pain 01/13/2019  . Ovarian cancer ([place]) dx'd 01/2015  . PONV (postoperative nausea and vomiting)   . Port-A-Cath in place    Right side  . Portacath in place 09/12/2015  . Pre-diabetes   . Pyelonephritis   . Right knee pain 11/28/2011  . Scleroderma ([place])   . Screen for colon cancer 03/02/2019  . Slow rate of speech 08/22/2015   Chronic from CVA.  She also has loss of left nasolabial fold and slight left facial droop/small asymmetry on  the left side secondary to CVA  . UTI (urinary tract infection) 05/07/2018    Past Surgical History:  Procedure Laterality Date  . ABDOMINAL HYSTERECTOMY N/A 03/13/2015   Procedure: TOTAL HYSTERECTOMY ABDOMINAL BILATERAL SALPINGO OOPHORECTOMY RADICAL TUMOR [place];  Surgeon: Everitt Amber, MD;  Location: [facility];  Service: Gynecology;  Laterality: N/A;  . BOWEL RESECTION  03/13/2015   Procedure: SMALL BOWEL RESECTION;  Surgeon: Everitt Amber, MD;  Location: [facility];  Service: Gynecology;;  . [place] and 1984  . COLONOSCOPY WITH PROPOFOL N/A 07/19/2020   Procedure: COLONOSCOPY WITH PROPOFOL;  Surgeon: Doran Stabler,  MD;  Location: [facility];  Service: Gastroenterology;  Laterality: N/A;  . COLOSTOMY  2008  . COLOSTOMY TAKEDOWN    . ESOPHAGOGASTRODUODENOSCOPY (EGD) WITH PROPOFOL N/A 12/21/2018   Procedure: ESOPHAGOGASTRODUODENOSCOPY (EGD) WITH PROPOFOL;  Surgeon: Doran Stabler, MD;  Location: [facility];  Service: Gastroenterology;  Laterality: N/A;  . HEMOSTASIS CLIP PLACEMENT  07/19/2020   Procedure: HEMOSTASIS CLIP PLACEMENT;  Surgeon: Doran Stabler, MD;  Location: [facility];  Service: Gastroenterology;;  . LAPAROTOMY N/A 03/13/2015   Procedure: EXPLORATORY LAPAROTOMY;  Surgeon: Everitt Amber, MD;  Location: [facility];  Service: Gynecology;  Laterality: N/A;  . POLYPECTOMY  07/19/2020   Procedure: POLYPECTOMY;  Surgeon: Doran Stabler, MD;  Location: [facility];  Service: Gastroenterology;;  . [place]  . VENTRAL HERNIA REPAIR  03/13/2015   Procedure: HERNIA REPAIR VENTRAL ADULT;  Surgeon: Everitt Amber, MD;  Location: [facility];  Service: Gynecology;;    Current Medications: Current Meds  Medication Sig  . acetaminophen (TYLENOL) 500 MG tablet Take 1,000 mg by mouth every 6 (six) hours as needed for moderate pain.  Marland Kitchen albuterol (VENTOLIN HFA) 108 (90 Base) MCG/ACT inhaler Inhale 2 puffs into the lungs every 6 (six) hours as needed for wheezing or shortness of breath. Please instruct patient in usage.  Marland Kitchen amLODipine (NORVASC) 5 MG tablet Take 5 mg by mouth daily.  . B-D UF III MINI PEN NEEDLES 31G X 5 MM MISC   . Cholecalciferol (VITAMIN D) 50 MCG (2000 UT) CAPS Take 8,000 Units by mouth daily.  . diclofenac Sodium (VOLTAREN) 1 % GEL Apply to knee once daily as needed (Patient taking differently: Apply 1 application topically daily as needed (knee pain). Apply to knee once daily as needed)  . dicyclomine (BENTYL) 10 MG capsule Take 1 capsule (10 mg total) by mouth 2 (two) times daily before a meal.  . furosemide (LASIX) 20 MG tablet Take 20 mg by mouth daily as  needed for edema.  . gabapentin (NEURONTIN) 100 MG capsule Take 1 capsule (100 mg total) by mouth daily as needed (neuropathy).  . Insulin Pen Needle 31G X 5 MM MISC Use as directed with Victoza  . lidocaine-prilocaine (EMLA) cream Apply 1 application topically as needed (port access).  . liraglutide (VICTOZA) 18 MG/3ML SOPN Inject 1.8 mg into the skin daily.  Marland Kitchen omeprazole (PRILOSEC) 40 MG capsule Take 1 capsule (40 mg total) by mouth daily.  . rivaroxaban (XARELTO) 20 MG TABS tablet Take 1 tablet (20 mg total) by mouth daily with supper.  . TOVIAZ 8 MG TB24 tablet TAKE 1 TABLET(8 MG) BY MOUTH DAILY (Patient taking differently: Take 8 mg by mouth daily.)  . triamcinolone (KENALOG) 0.1 % APPLY TO THE AFFECTED AREA TWICE DAILY AS NEEDED FOR SKIN IRRITATION (Patient taking differently: Apply 1 application topically 2 (two) times daily as  needed (irritation).)  . valACYclovir (VALTREX) 500 MG tablet Take 1 tablet (500 mg total) by mouth 2 (two) times daily. For 3 days at start of outbreak (Patient taking differently: Take 500 mg by mouth 2 (two) times daily as needed (outbreak). For 3 days at start of outbreak)     Allergies:   Penicillins   Social History   Socioeconomic History  . Marital status: Legally Separated    Spouse name: Not on file  . Number of children: Not on file  . Years of education: Not on file  . Highest education level: Not on file  Occupational History  . Not on file  Tobacco Use  . Smoking status: Never Smoker  . Smokeless tobacco: Never Used  Vaping Use  . Vaping Use: Never used  Substance and Sexual Activity  . Alcohol use: Yes    Comment: Maybe wine every 6 months  . Drug use: No  . Sexual activity: Yes    Partners: Male    Comment: Told she could not have more children in 1986  Other Topics Concern  . 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   Married x 23 years, recently separated (4/15).  No domestic violence.  + financial stress.     Lives previously with husband who moved out, now with daughter who has medical problems, including Hodgkin's disease, and granddaughter (age 49).     Uses seat belt.    Social Determinants of Health   Financial Resource Strain: Not on file  Food Insecurity: Not on file  Transportation Needs: Not on file  Physical Activity: Not on file  Stress: Not on file  Social Connections: Not on file     Family History: The patient's family history includes Asthma in her maternal aunt; Colon polyps in her mother; Diabetes in her daughter, mother, paternal aunt, paternal aunt, paternal grandfather, and paternal uncle; Fibroids in her mother; Hodgkin's lymphoma (age of onset: 21) in her daughter; Hyperlipidemia in her father and mother; Hypertension in her mother; Kidney failure (age of onset: 54) in her maternal grandmother; Pancreatic cancer in her paternal grandmother; Prostate cancer (age of onset: 26) in her maternal uncle. There is no history of Heart disease, Stroke, Colon cancer, Stomach cancer, Esophageal cancer, or Rectal cancer.  ROS:   Please see the history of present illness.     All other systems reviewed and are negative.  EKGs/Labs/Other Studies Reviewed:    The following studies were reviewed today:  Echo 10/11/19: 1. Left ventricular ejection fraction, by estimation, is 60 to 65%. The  left ventricle has normal function. The left ventricle has no regional  wall motion abnormalities. There is mild left ventricular hypertrophy.  Left ventricular diastolic parameters  are consistent with Grade I diastolic dysfunction (impaired relaxation).  2. Right ventricular systolic function is normal. The right ventricular  size is normal.  3. The mitral valve is grossly normal. No evidence of mitral valve  regurgitation.  4. The aortic valve is tricuspid. Aortic valve regurgitation is  not  visualized.  5. The inferior vena cava is normal in size with greater than 50%  respiratory variability, suggesting right atrial pressure of 3 mmHg.   EKG:  EKG is not ordered today.    Recent Labs: 07/19/2020: ALT 14; B Natriuretic Peptide 181.4 07/26/2020: BUN 15; Creatinine, Ser 1.15; Hemoglobin  11.4; Platelets 336; Potassium 4.8; Sodium 141  Recent Lipid Panel    Component Value Date/Time   CHOL 241 (H) 01/01/2018 1010   TRIG 64 02/19/2019 1111   HDL 54 01/01/2018 1010   CHOLHDL 4.5 01/01/2018 1010   VLDL 19 01/01/2018 1010   LDLCALC 168 (H) 01/01/2018 1010   LDLDIRECT 164 (H) 07/26/2013 0933    Physical Exam:    VS:  BP (!) 148/80 (BP Location: Left Wrist, Patient Position: Sitting, Cuff Size: Normal)   Pulse 76   Ht 5\' 5"  (1.651 m)   Wt (!) 371 lb 3.2 oz (168.4 kg)   LMP 10/26/2013   SpO2 98%   BMI 61.77 kg/m     Wt Readings from Last 3 Encounters:  08/28/20 (!) 371 lb 3.2 oz (168.4 kg)  08/08/20 (!) 353 lb (160.1 kg)  07/21/20 (!) 366 lb 13.5 oz (166.4 kg)     GEN: obese female in NAD, right-sided port HEENT: Normal NECK: No JVD; No carotid bruits LYMPHATICS: No lymphadenopathy CARDIAC: RRR, no murmurs, rubs, gallops RESPIRATORY:  Clear to auscultation without rales, wheezing or rhonchi  ABDOMEN: Soft, non-tender, non-distended MUSCULOSKELETAL:  No edema; No deformity  SKIN: Warm and dry NEUROLOGIC:  Alert and oriented x 3 PSYCHIATRIC:  Normal affect   ASSESSMENT:    1. Chronic diastolic CHF (congestive heart failure) ([place])   2. Essential hypertension, benign   3. Morbid obesity ([place])   4. History of pulmonary embolism   5. Hyperlipidemia, unspecified hyperlipidemia type    PLAN:    In order of problems listed above:  Chronic diastolic dysfunction - preserved EF on echo 2021 - not taking lasix consistently - no further dyspnea following ABX completion   Elevated troponin - no further chest pain - CE elevated in a pattern consistent  with demand ischemia - no further workup for now, will call with recurrence of symptoms   Hx of DVT/PE (2017, 2018) - on chronic xarelto, no bleeding problems   Hypertension - continue amlodipine - elevated today because she has not taken medications yet - fasting for lipid profile   Hyperlipidemia - LDL in 2019 was 168 - has not been on cholesterol medication - given DM, HTN, and obesity, will recheck this and have a low threshold to start statin therapy - will collect CMP and lipids today - we also discussed qnol should she have side effects of crestor   Hx of ovarian and endometrial cancer - follows with onc gyn - right sided port in place - nearly 5 years out   Medication Adjustments/Labs and Tests Ordered: Current medicines are reviewed at length with the patient today.  Concerns regarding medicines are outlined above.  No orders of the defined types were placed in this encounter.  No orders of the defined types were placed in this encounter.   Signed, Ledora Bottcher, PA  08/28/2020 11:13 AM    [place]

## 2020-08-28 ENCOUNTER — Encounter: Payer: Self-pay | Admitting: Physician Assistant

## 2020-08-28 ENCOUNTER — Ambulatory Visit (INDEPENDENT_AMBULATORY_CARE_PROVIDER_SITE_OTHER): Payer: Medicare HMO | Admitting: Physician Assistant

## 2020-08-28 VITALS — BP 148/80 | HR 76 | Ht 65.0 in | Wt 371.2 lb

## 2020-08-28 DIAGNOSIS — I5032 Chronic diastolic (congestive) heart failure: Secondary | ICD-10-CM

## 2020-08-28 DIAGNOSIS — Z86711 Personal history of pulmonary embolism: Secondary | ICD-10-CM

## 2020-08-28 DIAGNOSIS — I10 Essential (primary) hypertension: Secondary | ICD-10-CM

## 2020-08-28 DIAGNOSIS — Z79899 Other long term (current) drug therapy: Secondary | ICD-10-CM

## 2020-08-28 DIAGNOSIS — E785 Hyperlipidemia, unspecified: Secondary | ICD-10-CM

## 2020-08-28 NOTE — Patient Instructions (Signed)
Medication Instructions:  The current medical regimen is effective;  continue present plan and medications as directed. Please refer to the Current Medication list given to you today. *If you need a refill on your cardiac medications before your next appointment, please call your pharmacy*  Lab Work:   Testing/Procedures:  NONE    NONE  Follow-Up: Your next appointment:  6 month(s) In Person with Cherlynn Kaiser, MD OR IF UNAVAILABLE [place], FNP-C   Please call our office 2 months in advance to schedule this appointment   At [facility], you and your health needs are our priority.  As part of our continuing mission to provide you with exceptional heart care, we have created designated Provider Care Teams.  These Care Teams include your primary Cardiologist (physician) and Advanced Practice Providers (APPs -  Physician Assistants and Nurse Practitioners) who all work together to provide you with the care you need, when you need it.

## 2020-08-29 ENCOUNTER — Ambulatory Visit: Payer: Medicare HMO | Admitting: Physical Therapy

## 2020-08-29 ENCOUNTER — Other Ambulatory Visit: Payer: Self-pay | Admitting: *Deleted

## 2020-08-29 ENCOUNTER — Other Ambulatory Visit: Payer: Self-pay

## 2020-08-29 DIAGNOSIS — R293 Abnormal posture: Secondary | ICD-10-CM

## 2020-08-29 DIAGNOSIS — R2681 Unsteadiness on feet: Secondary | ICD-10-CM | POA: Diagnosis not present

## 2020-08-29 DIAGNOSIS — R2689 Other abnormalities of gait and mobility: Secondary | ICD-10-CM

## 2020-08-29 DIAGNOSIS — M6281 Muscle weakness (generalized): Secondary | ICD-10-CM

## 2020-08-29 LAB — COMPREHENSIVE METABOLIC PANEL
ALT: 8 IU/L (ref 0–32)
AST: 17 IU/L (ref 0–40)
Albumin/Globulin Ratio: 2.1 (ref 1.2–2.2)
Albumin: 4.2 g/dL (ref 3.8–4.9)
Alkaline Phosphatase: 99 IU/L (ref 44–121)
BUN/Creatinine Ratio: 16 (ref 9–23)
BUN: 15 mg/dL (ref 6–24)
Bilirubin Total: 0.3 mg/dL (ref 0.0–1.2)
CO2: 23 mmol/L (ref 20–29)
Calcium: 9.2 mg/dL (ref 8.7–10.2)
Chloride: 105 mmol/L (ref 96–106)
Creatinine, Ser: 0.93 mg/dL (ref 0.57–1.00)
Globulin, Total: 2 g/dL (ref 1.5–4.5)
Glucose: 77 mg/dL (ref 65–99)
Potassium: 4.1 mmol/L (ref 3.5–5.2)
Sodium: 142 mmol/L (ref 134–144)
Total Protein: 6.2 g/dL (ref 6.0–8.5)
eGFR: 73 mL/min/{1.73_m2} (ref >59)

## 2020-08-29 LAB — LIPID PANEL
Chol/HDL Ratio: 4 ratio (ref 0.0–4.4)
Cholesterol, Total: 224 mg/dL — ABNORMAL HIGH (ref 100–199)
HDL: 56 mg/dL (ref >39)
LDL Chol Calc (NIH): 149 mg/dL — ABNORMAL HIGH (ref 0–99)
Triglycerides: 106 mg/dL (ref 0–149)
VLDL Cholesterol Cal: 19 mg/dL (ref 5–40)

## 2020-08-29 MED ORDER — ROSUVASTATIN CALCIUM 20 MG PO TABS: 20.0000 mg | ORAL_TABLET | Freq: Every day | ORAL | 3 refills | Status: DC

## 2020-08-29 NOTE — Patient Instructions (Signed)
Aquatic Therapy at [facility]-  What to Expect!  Where:   [facility] [place], [place] [address] Rehab phone [phone]  NOTE:  You will receive an automated phone message reminding you of your appt and it will say the appointment is at the [facility] clinic.          How to Prepare: . Please make sure you drink 8 ounces of water about one hour prior to your pool session . A caregiver may attend if needed with the patient to help assist as needed. A caregiver can sit in the pool room on chair. Marland Kitchen Please arrive IN YOUR SUIT and 15 minutes prior to your appointment - this helps to avoid delays in starting your session. . Please make sure to attend to any toileting needs prior to entering the pool . [place] rooms for changing are provided.   There is direct access to the pool deck form the locker room.  You can lock your belongings in a locker with lock provided. . Once on the pool deck your therapist will ask if you have signed the Patient  Consent and Assignment of Benefits form before beginning treatment . Your therapist may take your blood pressure prior to, during and after your session if indicated . We usually try and create a home exercise program based on activities we do in the pool.  Please be thinking about who might be able to assist you in the pool should you need to participate in an aquatic home exercise program at the time of discharge if you need assistance.  Some patients do not want to or do not have the ability to participate in an aquatic home program - this is not a barrier in any way to you participating in aquatic therapy as part of your current therapy plan! . After Discharge from PT, you can continue using home program at  the [facility], there is a drop-in fee for $5 ($45 a month)or for 60 years  or older $4.00 ($40 a month for seniors ) or any local [facility] pool.  Memberships  for purchase are available for gym/pool at [facility]  It is very important that your last visit be in the clinic at [facility] street after your last aquatic visit.    Please make sure that you have a land/[address] on the calendar.   About the pool: 1. Pool is located 500 FT from the entrance of the building.  2. Entering the pool Your therapist will assist you; there are multiple ways to enter including stairs with railings, a walk in ramp, a roll in chair and a mechanical lift. Your therapist will determine the most appropriate way for you. 3. Water temperature is usually between 86-87 degrees 4. There may be other swimmers in the pool at the same time      Contact Info: For scheduling and cancellations        Appointments: Please call the [facility] if you need to cancel or reschedule an appointment at [place]. Outpatient Rehabilitation @ [facility]         All sessions are 45 minutes [address] [place], [place] [address]

## 2020-08-29 NOTE — Therapy (Signed)
[place], [place], [address] Phone: [phone]   Fax:  [phone]  Physical Therapy Treatment  Patient Details  Name: [name] MRN: [id number] Date of Birth: [birth year] Referring Provider (PT): Martyn Malay, MD   Encounter Date: 08/29/2020   PT End of Session - 08/29/20 0849    Visit Number 7    Number of Visits 17    Date for PT Re-Evaluation 10/03/20    Authorization Type [employer] MCR - FOTO 6th and 10th    Progress Note Due on Visit 10    PT Start Time 0850    PT Stop Time 0930    PT Time Calculation (min) 40 min    Activity Tolerance Patient tolerated treatment well;Patient limited by fatigue    Behavior During Therapy [facility] for tasks assessed/performed           Past Medical History:  Diagnosis Date  . Allergy   . Anemia   . Anxiety   . Arthritis   . Blood transfusion without reported diagnosis   . Cough   . Cough 06/11/2018  . Difficulty sleeping 06/11/2018  . Diverticulitis with perforation 2010  . DVT (deep vein thrombosis) in pregnancy 11/24/2016  . DVT, lower extremity ([place])   . Endometrial cancer ([place])   . Enlarged lymph nodes 08/13/2017  . Family history of adverse reaction to anesthesia    pts daughter had difficulty awakening following anesthesia, long time to wake up  . GERD (gastroesophageal reflux disease)   . Headache   . Hemoptysis 04/22/2018  . History of bronchitis   . History of ear infections   . Hospital discharge follow-up 03/02/2019  . Hx of migraines   . Hyperlipidemia   . Hypertension   . IBS (irritable bowel syndrome)   . Kidney infection   . Lupus (systemic lupus erythematosus) ([place]) 02/2017  . Morbid obesity ([place])   . Neck pain 01/13/2019  . Ovarian cancer ([place]) dx'd 01/2015  . PONV (postoperative nausea and vomiting)   . Port-A-Cath in place    Right side  . Portacath in place 09/12/2015  . Pre-diabetes   . Pyelonephritis   . Right knee pain  11/28/2011  . Scleroderma ([place])   . Screen for colon cancer 03/02/2019  . Slow rate of speech 08/22/2015   Chronic from CVA.  She also has loss of left nasolabial fold and slight left facial droop/small asymmetry on the left side secondary to CVA  . UTI (urinary tract infection) 05/07/2018    Past Surgical History:  Procedure Laterality Date  . ABDOMINAL HYSTERECTOMY N/A 03/13/2015   Procedure: TOTAL HYSTERECTOMY ABDOMINAL BILATERAL SALPINGO OOPHORECTOMY RADICAL TUMOR [place];  Surgeon: Everitt Amber, MD;  [facility];  Service: Gynecology;  Laterality: N/A;  . BOWEL RESECTION  03/13/2015   Procedure: SMALL BOWEL RESECTION;  Surgeon: Everitt Amber, MD;  Location: [facility];  Service: Gynecology;;  . [place] and 1984  . COLONOSCOPY WITH PROPOFOL N/A 07/19/2020   Procedure: COLONOSCOPY WITH PROPOFOL;  Surgeon: Doran Stabler, MD;  [facility];  Service: Gastroenterology;  Laterality: N/A;  . COLOSTOMY  2008  . COLOSTOMY TAKEDOWN    . ESOPHAGOGASTRODUODENOSCOPY (EGD) WITH PROPOFOL N/A 12/21/2018   Procedure: ESOPHAGOGASTRODUODENOSCOPY (EGD) WITH PROPOFOL;  Surgeon: Doran Stabler, MD;  [facility];  Service: Gastroenterology;  Laterality: N/A;  . HEMOSTASIS CLIP PLACEMENT  07/19/2020   Procedure: HEMOSTASIS CLIP PLACEMENT;  Surgeon: Wilfrid Lund  L III, MD;  [facility];  Service: Gastroenterology;;  . LAPAROTOMY N/A 03/13/2015   Procedure: EXPLORATORY LAPAROTOMY;  Surgeon: Everitt Amber, MD;  [facility];  Service: Gynecology;  Laterality: N/A;  . POLYPECTOMY  07/19/2020   Procedure: POLYPECTOMY;  Surgeon: Doran Stabler, MD;  [facility];  Service: Gastroenterology;;  . [place]  . VENTRAL HERNIA REPAIR  03/13/2015   Procedure: HERNIA REPAIR VENTRAL ADULT;  Surgeon: Everitt Amber, MD;  [facility];  Service: Gynecology;;    There were no vitals filed for this visit.   Subjective Assessment -  08/29/20 0912    Subjective Pt states she went to the doctor in regards to her LE swelling. She reports her cardiologist believed it was due to the heat/weather. No new changes made. Pt reports her R knee is hurting a lot today. She notes it usually improves as the day goes on and it feels less stiff. Pt states she feels her progress is hampered by her knee pain. She reports she wants to return to the [facility] sometime next week.    Pertinent History Numerous and complex PMH - recent hospitalization 07/19/20 - 07/21/20 for pneumonia    Limitations Walking;Standing    How long can you sit comfortably? indefinite    How long can you stand comfortably? 45-60 minutes    How long can you walk comfortably? 5 minutes    Patient Stated Goals pt would like to improve activty tolerance in order to get back to start with aquatic therapy program again    Currently in Pain? Yes    Pain Score 7     Pain Location Knee    Pain Orientation Right              [facility] PT Assessment - 08/29/20 0001      6 minute walk test results    Aerobic Endurance Distance Walked 803    Endurance additional comments 1 sitting rest break                         [facility] Adult PT Treatment/Exercise - 08/29/20 0001      Knee/Hip Exercises: Seated   Long Arc Quad 2 sets;10 reps;Both    Long Arc Quad Weight 4 lbs.    Other Seated Knee/Hip Exercises Slow march with weight ball    Marching Strengthening;2 sets;10 reps    Marching Weights 4 lbs.      Knee/Hip Exercises: Supine   Bridges 2 sets;10 reps;Strengthening;Both    Straight Leg Raises 2 sets;10 reps;Both    Straight Leg Raises Limitations 1.5 lbs    Other Supine Knee/Hip Exercises pball rollout with knee flex/ext 2x10    Other Supine Knee/Hip Exercises hamstring curl pressing into pball 2x10      Knee/Hip Exercises: Sidelying   Hip ABduction Strengthening;2 sets;10 reps                  PT Education - 08/29/20 0934    Education Details  Discussed aquatics and her updated HEP    Person(s) Educated Patient    Methods Explanation;Demonstration;Handout    Comprehension Verbalized understanding;Returned demonstration;Tactile cues required            PT Short Term Goals - 08/27/20 0955      PT SHORT TERM GOAL #1   Title Pt will be independent with initial HEP for improved carryover    Baseline initial HEP given  Time 3    Period Days    Status Achieved    Target Date 08/27/20      PT SHORT TERM GOAL #2   Title Pt will be compliant with walking program for improved endurance and mobility    Time 3    Period Weeks    Status Achieved    Target Date 08/27/20             PT Long Term Goals - 08/27/20 0954      PT LONG TERM GOAL #1   Title Pt will improve FOTO function score to no less than 61% in order to improve confidence and functional ability    Baseline 36% function; updated - 41%    Time 8    Period Weeks    Status New    Target Date 10/01/20      PT LONG TERM GOAL #2   Title Pt will improve reps in 30 Sec Sit to Stand to no less than 12 reps in order to improve functional mobility    Baseline 10 reps    Time 8    Period Weeks    Status New    Target Date 10/01/20      PT LONG TERM GOAL #3   Title Pt will increase distance in 6MWT to no less than 912ft with stable O2 sats in order to improve endurance and community navigation    Baseline see flowsheet    Time 8    Period Weeks    Status New    Target Date 10/01/20                 Plan - 08/29/20 0919    Clinical Impression Statement Pt with increased distance during 6 MWT. Performed exercises in seated, supine, and sidelying this session due to pt's increased R knee pain. Progressed her core strengthening in supine. Pt challenged with sidelying hip abduction. Discussed beginning aquatics 1x/wk and provided pt with information.    Personal Factors and Comorbidities Comorbidity 3+;Fitness    Comorbidities SLE; HTN; Cancer     Examination-Activity Limitations Stand;Squat;Stairs;Locomotion Level;Carry;Lift;Transfers    Examination-Participation Restrictions Yard Work;Shop;Community Activity    Stability/Clinical Decision Making Stable/Uncomplicated    Rehab Potential Good    PT Frequency 2x / week    PT Duration 8 weeks    PT Treatment/Interventions ADLs/Self Care Home Management;Aquatic Therapy;Electrical Stimulation;Moist Heat;Cryotherapy;Ultrasound;Gait Scientist, forensic;Therapeutic activities;Therapeutic exercise;Functional mobility training;Balance training;Neuromuscular re-education;Patient/family education;Manual techniques    PT Next Visit Plan assess response to HEP exercise ; progress endurance exercises as able. Continue strengthening as able.    PT Home Exercise Plan Access Code: [email or web address]    Consulted and Agree with Plan of Care Patient           Patient will benefit from skilled therapeutic intervention in order to improve the following deficits and impairments:  Abnormal gait,Decreased activity tolerance,Decreased balance,Decreased endurance,Decreased mobility,Decreased strength,Difficulty walking  Visit Diagnosis: Unsteadiness on feet  Muscle weakness (generalized)  Other abnormalities of gait and mobility  Abnormal posture     Problem List Patient Active Problem List   Diagnosis Date Noted  . Acute respiratory failure with hypoxia ([place]) 07/19/2020  . Chronic diastolic CHF (congestive heart failure) ([place]) 07/19/2020  . Bronchospasm 07/19/2020  . Elevated troponin 07/19/2020  . Acute nasopharyngitis 05/18/2020  . Incontinence 03/17/2020  . Need for immunization against influenza 01/05/2020  . Herpes simplex virus (HSV) infection of buttock 01/05/2020  . OSA (obstructive sleep apnea) 07/25/2019  .  Prediabetes 07/25/2019  . Pneumonia 02/19/2019  . Lateral epicondylitis of left elbow 01/13/2019  . Normocytic anemia 05/29/2018  . Scleroderma ([place]) 04/22/2018  . Cough with  hemoptysis 04/22/2018  . Swelling of foot joint, right 04/08/2018  . Positive ANA (antinuclear antibody) 09/23/2016  . Eczema 01/27/2016  . History of pulmonary embolism 09/12/2015  . Long term current use of anticoagulant therapy 09/06/2015  . Chemotherapy-induced peripheral neuropathy ([place]) 06/13/2015  . CKD (chronic kidney disease) stage 3, GFR 30-59 ml/min (HCC) 05/12/2015  . GERD (gastroesophageal reflux disease) 05/12/2015  . Ovarian carcinosarcoma, right ([place]) 03/29/2015  . Endometrial ca ([place]) 03/29/2015  . Ventral hernia without obstruction or gangrene   . Morbid obesity ([place]) 02/19/2015  . Essential hypertension, benign   . Stress at home 12/14/2014  . Left knee pain 08/04/2013  . Seasonal allergies 07/29/2013    [facility] April Gordy Levan PT, DPT 08/29/2020, 9:36 AM  [facility] [address] [place], [place], [address] Phone: [phone]   Fax:  [phone]  Name: AHLAYAH TARKOWSKI MRN: [id number] Date of Birth: 29-Jul-1965

## 2020-09-04 ENCOUNTER — Ambulatory Visit: Payer: Medicare HMO | Admitting: Physical Therapy

## 2020-09-04 ENCOUNTER — Encounter: Payer: Self-pay | Admitting: Physical Therapy

## 2020-09-04 ENCOUNTER — Other Ambulatory Visit: Payer: Self-pay

## 2020-09-04 DIAGNOSIS — R2681 Unsteadiness on feet: Secondary | ICD-10-CM | POA: Diagnosis not present

## 2020-09-04 DIAGNOSIS — R2689 Other abnormalities of gait and mobility: Secondary | ICD-10-CM

## 2020-09-04 DIAGNOSIS — R293 Abnormal posture: Secondary | ICD-10-CM

## 2020-09-04 DIAGNOSIS — M6281 Muscle weakness (generalized): Secondary | ICD-10-CM

## 2020-09-04 NOTE — Therapy (Signed)
[place], [place], [address] Phone: [phone]   Fax:  [phone]  Physical Therapy Treatment  Patient Details  Name: [name] MRN: [id number] Date of Birth: July 22, 1965 Referring Provider (PT): Martyn Malay, MD   Encounter Date: 09/04/2020   PT End of Session - 09/04/20 0937    Visit Number 8    Number of Visits 17    Date for PT Re-Evaluation 10/03/20    Authorization Type [employer] MCR - FOTO 6th and 10th    Progress Note Due on Visit 10    PT Start Time 0935    PT Stop Time 1015    PT Time Calculation (min) 40 min    Activity Tolerance Patient tolerated treatment well;Patient limited by fatigue    Behavior During Therapy [facility] for tasks assessed/performed           Past Medical History:  Diagnosis Date  . Allergy   . Anemia   . Anxiety   . Arthritis   . Blood transfusion without reported diagnosis   . Cough   . Cough 06/11/2018  . Difficulty sleeping 06/11/2018  . Diverticulitis with perforation 2010  . DVT (deep vein thrombosis) in pregnancy 11/24/2016  . DVT, lower extremity ([place])   . Endometrial cancer ([place])   . Enlarged lymph nodes 08/13/2017  . Family history of adverse reaction to anesthesia    pts daughter had difficulty awakening following anesthesia, long time to wake up  . GERD (gastroesophageal reflux disease)   . Headache   . Hemoptysis 04/22/2018  . History of bronchitis   . History of ear infections   . Hospital discharge follow-up 03/02/2019  . Hx of migraines   . Hyperlipidemia   . Hypertension   . IBS (irritable bowel syndrome)   . Kidney infection   . Lupus (systemic lupus erythematosus) ([place]) 02/2017  . Morbid obesity ([place])   . Neck pain 01/13/2019  . Ovarian cancer ([place]) dx'd 01/2015  . PONV (postoperative nausea and vomiting)   . Port-A-Cath in place    Right side  . Portacath in place 09/12/2015  . Pre-diabetes   . Pyelonephritis   . Right knee pain  11/28/2011  . Scleroderma ([place])   . Screen for colon cancer 03/02/2019  . Slow rate of speech 08/22/2015   Chronic from CVA.  She also has loss of left nasolabial fold and slight left facial droop/small asymmetry on the left side secondary to CVA  . UTI (urinary tract infection) 05/07/2018    Past Surgical History:  Procedure Laterality Date  . ABDOMINAL HYSTERECTOMY N/A 03/13/2015   Procedure: TOTAL HYSTERECTOMY ABDOMINAL BILATERAL SALPINGO OOPHORECTOMY RADICAL TUMOR [place];  Surgeon: Everitt Amber, MD;  [facility];  Service: Gynecology;  Laterality: N/A;  . BOWEL RESECTION  03/13/2015   Procedure: SMALL BOWEL RESECTION;  Surgeon: Everitt Amber, MD;  [facility];  Service: Gynecology;;  . [place] and 1984  . COLONOSCOPY WITH PROPOFOL N/A 07/19/2020   Procedure: COLONOSCOPY WITH PROPOFOL;  Surgeon: Doran Stabler, MD;  [facility];  Service: Gastroenterology;  Laterality: N/A;  . COLOSTOMY  2008  . COLOSTOMY TAKEDOWN    . ESOPHAGOGASTRODUODENOSCOPY (EGD) WITH PROPOFOL N/A 12/21/2018   Procedure: ESOPHAGOGASTRODUODENOSCOPY (EGD) WITH PROPOFOL;  Surgeon: Doran Stabler, MD;  [facility];  Service: Gastroenterology;  Laterality: N/A;  . HEMOSTASIS CLIP PLACEMENT  07/19/2020   Procedure: HEMOSTASIS CLIP PLACEMENT;  Surgeon: Wilfrid Lund  L III, MD;  [facility];  Service: Gastroenterology;;  . LAPAROTOMY N/A 03/13/2015   Procedure: EXPLORATORY LAPAROTOMY;  Surgeon: Everitt Amber, MD;  [facility];  Service: Gynecology;  Laterality: N/A;  . POLYPECTOMY  07/19/2020   Procedure: POLYPECTOMY;  Surgeon: Doran Stabler, MD;  [facility];  Service: Gastroenterology;;  . [place]  . VENTRAL HERNIA REPAIR  03/13/2015   Procedure: HERNIA REPAIR VENTRAL ADULT;  Surgeon: Everitt Amber, MD;  [facility];  Service: Gynecology;;    There were no vitals filed for this visit.   Subjective Assessment -  09/04/20 0944    Subjective Pt reports she has not been feeling motivated to do her exercises -- she notes that there was a death amongst her close friends children.    Pertinent History Numerous and complex PMH - recent hospitalization 07/19/20 - 07/21/20 for pneumonia    Limitations Walking;Standing    How long can you sit comfortably? indefinite    How long can you stand comfortably? 45-60 minutes    How long can you walk comfortably? 5 minutes    Patient Stated Goals pt would like to improve activty tolerance in order to get back to start with aquatic therapy program again    Currently in Pain? Yes    Pain Score 7     Pain Location Knee    Pain Orientation Right                             [facility] Adult PT Treatment/Exercise - 09/04/20 0001      Knee/Hip Exercises: Aerobic   Nustep L5 UE/LE x 8 min      Knee/Hip Exercises: Standing   Heel Raises 20 reps    Hip Abduction 2 sets;10 reps;Both    Abduction Limitations green tband    Hip Extension 2 sets;10 reps;Both    Extension Limitations green tband    Other Standing Knee Exercises hip flexion green tband 2x10    Other Standing Knee Exercises Deadlift to knee level 6# 2x10      Knee/Hip Exercises: Seated   Other Seated Knee/Hip Exercises Slow march with weight ball x10    Other Seated Knee/Hip Exercises pball press down forward & diagonals 2x10 5"                    PT Short Term Goals - 08/27/20 0955      PT SHORT TERM GOAL #1   Title Pt will be independent with initial HEP for improved carryover    Baseline initial HEP given    Time 3    Period Days    Status Achieved    Target Date 08/27/20      PT SHORT TERM GOAL #2   Title Pt will be compliant with walking program for improved endurance and mobility    Time 3    Period Weeks    Status Achieved    Target Date 08/27/20             PT Long Term Goals - 08/27/20 0954      PT LONG TERM GOAL #1   Title Pt will improve FOTO function  score to no less than 61% in order to improve confidence and functional ability    Baseline 36% function; updated - 41%    Time 8    Period Weeks    Status New  Target Date 10/01/20      PT LONG TERM GOAL #2   Title Pt will improve reps in 30 Sec Sit to Stand to no less than 12 reps in order to improve functional mobility    Baseline 10 reps    Time 8    Period Weeks    Status New    Target Date 10/01/20      PT LONG TERM GOAL #3   Title Pt will increase distance in 6MWT to no less than 956ft with stable O2 sats in order to improve endurance and community navigation    Baseline see flowsheet    Time 8    Period Weeks    Status New    Target Date 10/01/20                 Plan - 09/04/20 0954    Clinical Impression Statement Continued to progress LE strengthening with green tband this session. Initiated deadlift. Pt tolerated well. Pt with continued knee pain hampering attempts of squating    Personal Factors and Comorbidities Comorbidity 3+;Fitness    Comorbidities SLE; HTN; Cancer    Examination-Activity Limitations Stand;Squat;Stairs;Locomotion Level;Carry;Lift;Transfers    Examination-Participation Restrictions Yard Work;Shop;Community Activity    Stability/Clinical Decision Making Stable/Uncomplicated    Rehab Potential Good    PT Frequency 2x / week    PT Duration 8 weeks    PT Treatment/Interventions ADLs/Self Care Home Management;Aquatic Therapy;Electrical Stimulation;Moist Heat;Cryotherapy;Ultrasound;Gait Scientist, forensic;Therapeutic activities;Therapeutic exercise;Functional mobility training;Balance training;Neuromuscular re-education;Patient/family education;Manual techniques    PT Next Visit Plan assess response to HEP exercise ; progress endurance exercises as able. Continue strengthening as able.    PT Home Exercise Plan Access Code: [email or web address]    Consulted and Agree with Plan of Care Patient           Patient will benefit from skilled  therapeutic intervention in order to improve the following deficits and impairments:  Abnormal gait,Decreased activity tolerance,Decreased balance,Decreased endurance,Decreased mobility,Decreased strength,Difficulty walking  Visit Diagnosis: Unsteadiness on feet  Muscle weakness (generalized)  Other abnormalities of gait and mobility  Abnormal posture     Problem List Patient Active Problem List   Diagnosis Date Noted  . Acute respiratory failure with hypoxia ([place]) 07/19/2020  . Chronic diastolic CHF (congestive heart failure) ([place]) 07/19/2020  . Bronchospasm 07/19/2020  . Elevated troponin 07/19/2020  . Acute nasopharyngitis 05/18/2020  . Incontinence 03/17/2020  . Need for immunization against influenza 01/05/2020  . Herpes simplex virus (HSV) infection of buttock 01/05/2020  . OSA (obstructive sleep apnea) 07/25/2019  . Prediabetes 07/25/2019  . Pneumonia 02/19/2019  . Lateral epicondylitis of left elbow 01/13/2019  . Normocytic anemia 05/29/2018  . Scleroderma ([place]) 04/22/2018  . Cough with hemoptysis 04/22/2018  . Swelling of foot joint, right 04/08/2018  . Positive ANA (antinuclear antibody) 09/23/2016  . Eczema 01/27/2016  . History of pulmonary embolism 09/12/2015  . Long term current use of anticoagulant therapy 09/06/2015  . Chemotherapy-induced peripheral neuropathy ([place]) 06/13/2015  . CKD (chronic kidney disease) stage 3, GFR 30-59 ml/min (HCC) 05/12/2015  . GERD (gastroesophageal reflux disease) 05/12/2015  . Ovarian carcinosarcoma, right ([place]) 03/29/2015  . Endometrial ca ([place]) 03/29/2015  . Ventral hernia without obstruction or gangrene   . Morbid obesity ([place]) 02/19/2015  . Essential hypertension, benign   . Stress at home 12/14/2014  . Left knee pain 08/04/2013  . Seasonal allergies 07/29/2013    Evaan Tidwell April Ma L Lamine Laton PT, DPT 09/04/2020, 10:12 AM  [facility]  [place], [place],  [address] Phone: [phone]   Fax:  [phone]  Name: MILISA KIMBELL MRN: [id number] Date of Birth: 14-Oct-1965

## 2020-09-06 ENCOUNTER — Ambulatory Visit: Payer: Medicare HMO | Attending: Family Medicine | Admitting: Physical Therapy

## 2020-09-06 ENCOUNTER — Other Ambulatory Visit: Payer: Self-pay

## 2020-09-06 DIAGNOSIS — M6281 Muscle weakness (generalized): Secondary | ICD-10-CM

## 2020-09-06 DIAGNOSIS — M542 Cervicalgia: Secondary | ICD-10-CM | POA: Insufficient documentation

## 2020-09-06 DIAGNOSIS — R2681 Unsteadiness on feet: Secondary | ICD-10-CM | POA: Insufficient documentation

## 2020-09-06 DIAGNOSIS — R2689 Other abnormalities of gait and mobility: Secondary | ICD-10-CM | POA: Insufficient documentation

## 2020-09-06 DIAGNOSIS — R293 Abnormal posture: Secondary | ICD-10-CM | POA: Insufficient documentation

## 2020-09-06 NOTE — Therapy (Addendum)
[place], [place], [address] Phone: [phone]   Fax:  [phone]  Physical Therapy Treatment and Progress Note  Patient Details  Name: [name] MRN: [id number] Date of Birth: 1965-07-03 Referring Provider (PT): Martyn Malay, MD  Progress Note Reporting Period 08/06/20 to 09/06/20  See note below for Objective Data and Assessment of Progress/Goals.       Encounter Date: 09/06/2020   PT End of Session - 09/06/20 1016    Visit Number 9    Number of Visits 17    Date for PT Re-Evaluation 10/03/20    Authorization Type [employer] MCR - FOTO 6th and 10th    Progress Note Due on Visit 10    PT Start Time 0935    PT Stop Time 1015    PT Time Calculation (min) 40 min    Activity Tolerance Patient tolerated treatment well;Patient limited by fatigue    Behavior During Therapy [facility] for tasks assessed/performed           Past Medical History:  Diagnosis Date  . Allergy   . Anemia   . Anxiety   . Arthritis   . Blood transfusion without reported diagnosis   . Cough   . Cough 06/11/2018  . Difficulty sleeping 06/11/2018  . Diverticulitis with perforation 2010  . DVT (deep vein thrombosis) in pregnancy 11/24/2016  . DVT, lower extremity ([place])   . Endometrial cancer ([place])   . Enlarged lymph nodes 08/13/2017  . Family history of adverse reaction to anesthesia    pts daughter had difficulty awakening following anesthesia, long time to wake up  . GERD (gastroesophageal reflux disease)   . Headache   . Hemoptysis 04/22/2018  . History of bronchitis   . History of ear infections   . Hospital discharge follow-up 03/02/2019  . Hx of migraines   . Hyperlipidemia   . Hypertension   . IBS (irritable bowel syndrome)   . Kidney infection   . Lupus (systemic lupus erythematosus) ([place]) 02/2017  . Morbid obesity ([place])   . Neck pain 01/13/2019  . Ovarian cancer ([place]) dx'd 01/2015  . PONV (postoperative nausea  and vomiting)   . Port-A-Cath in place    Right side  . Portacath in place 09/12/2015  . Pre-diabetes   . Pyelonephritis   . Right knee pain 11/28/2011  . Scleroderma ([place])   . Screen for colon cancer 03/02/2019  . Slow rate of speech 08/22/2015   Chronic from CVA.  She also has loss of left nasolabial fold and slight left facial droop/small asymmetry on the left side secondary to CVA  . UTI (urinary tract infection) 05/07/2018    Past Surgical History:  Procedure Laterality Date  . ABDOMINAL HYSTERECTOMY N/A 03/13/2015   Procedure: TOTAL HYSTERECTOMY ABDOMINAL BILATERAL SALPINGO OOPHORECTOMY RADICAL TUMOR [place];  Surgeon: Everitt Amber, MD;  [facility];  Service: Gynecology;  Laterality: N/A;  . BOWEL RESECTION  03/13/2015   Procedure: SMALL BOWEL RESECTION;  Surgeon: Everitt Amber, MD;  Location: [facility];  Service: Gynecology;;  . [place] and 1984  . COLONOSCOPY WITH PROPOFOL N/A 07/19/2020   Procedure: COLONOSCOPY WITH PROPOFOL;  Surgeon: Doran Stabler, MD;  [facility];  Service: Gastroenterology;  Laterality: N/A;  . COLOSTOMY  2008  . COLOSTOMY TAKEDOWN    . ESOPHAGOGASTRODUODENOSCOPY (EGD) WITH PROPOFOL N/A 12/21/2018   Procedure: ESOPHAGOGASTRODUODENOSCOPY (EGD) WITH PROPOFOL;  Surgeon: Doran Stabler, MD;  [facility];  Service: Gastroenterology;  Laterality: N/A;  . HEMOSTASIS CLIP PLACEMENT  07/19/2020   Procedure: HEMOSTASIS CLIP PLACEMENT;  Surgeon: Doran Stabler, MD;  [facility];  Service: Gastroenterology;;  . LAPAROTOMY N/A 03/13/2015   Procedure: EXPLORATORY LAPAROTOMY;  Surgeon: Everitt Amber, MD;  [facility];  Service: Gynecology;  Laterality: N/A;  . POLYPECTOMY  07/19/2020   Procedure: POLYPECTOMY;  Surgeon: Doran Stabler, MD;  [facility];  Service: Gastroenterology;;  . [place]  . VENTRAL HERNIA REPAIR  03/13/2015   Procedure: HERNIA REPAIR VENTRAL ADULT;   Surgeon: Everitt Amber, MD;  [facility];  Service: Gynecology;;    There were no vitals filed for this visit.   Subjective Assessment - 09/06/20 0940    Subjective Pt reports she has not been feeling motivated to do her exercises -- she notes that there was a death amongst her close friends children.    Pertinent History Numerous and complex PMH - recent hospitalization 07/19/20 - 07/21/20 for pneumonia    Limitations Walking;Standing    How long can you sit comfortably? indefinite    How long can you stand comfortably? 45-60 minutes    How long can you walk comfortably? 5 minutes    Patient Stated Goals pt would like to improve activty tolerance in order to get back to start with aquatic therapy program again    Currently in Pain? Yes    Pain Score 7     Pain Location Knee    Pain Orientation Right                             [facility] Adult PT Treatment/Exercise - 09/06/20 0001      Knee/Hip Exercises: Aerobic   Nustep L6 UE/LE x 10 min      Knee/Hip Exercises: Standing   Heel Raises 20 reps    Knee Flexion --    Hip Abduction 2 sets;10 reps;Both    Abduction Limitations green tband    Hip Extension 2 sets;10 reps;Both    Extension Limitations green tband    Other Standing Knee Exercises step tap on 4" step x10 forward and then x10 to the side    Other Standing Knee Exercises Deadlift 7# 2x10      Knee/Hip Exercises: Seated   Long Arc Quad 2 sets;10 reps;Both    Long Arc Quad Limitations green tband    Other Seated Knee/Hip Exercises slow march with alternating UE raise green tband 2x10    Other Seated Knee/Hip Exercises pball press down forward & diagonals 2x10x 5"                    PT Short Term Goals - 08/27/20 0955      PT SHORT TERM GOAL #1   Title Pt will be independent with initial HEP for improved carryover    Baseline initial HEP given    Time 3    Period Days    Status Achieved    Target Date 08/27/20      PT SHORT TERM GOAL  #2   Title Pt will be compliant with walking program for improved endurance and mobility    Time 3    Period Weeks    Status Achieved    Target Date 08/27/20             PT Long Term Goals - 08/27/20 2831  PT LONG TERM GOAL #1   Title Pt will improve FOTO function score to no less than 61% in order to improve confidence and functional ability    Baseline 36% function; updated - 41%    Time 8    Period Weeks    Status New    Target Date 10/01/20      PT LONG TERM GOAL #2   Title Pt will improve reps in 30 Sec Sit to Stand to no less than 12 reps in order to improve functional mobility    Baseline 10 reps    Time 8    Period Weeks    Status New    Target Date 10/01/20      PT LONG TERM GOAL #3   Title Pt will increase distance in 6MWT to no less than 915f with stable O2 sats in order to improve endurance and community navigation    Baseline see flowsheet    Time 8    Period Weeks    Status New    Target Date 10/01/20                 Plan - 09/06/20 0950    Clinical Impression Statement Pt with improving strength despite family issues resulting in decreased ability to perform exercises in the last week. Prior, to her family issues pt has been compliant to HEP. Pt has met all of her STGs and increasing her walking endurance with 6 MWT now to 800' vs 600' on initial eval. FOTO score has also improved.Reviewed HEP. Progressed deadlifting for increased weight. Pt tolerating >10 min of aerobic activity. Pt tolerated exercises well. Initiated exercises on steps.   Personal Factors and Comorbidities Comorbidity 3+;Fitness    Comorbidities SLE; HTN; Cancer    Examination-Activity Limitations Stand;Squat;Stairs;Locomotion Level;Carry;Lift;Transfers    Examination-Participation Restrictions Yard Work;Shop;Community Activity    PT Treatment/Interventions ADLs/Self Care Home Management;Aquatic Therapy;Electrical Stimulation;Moist Heat;Cryotherapy;Ultrasound;Gait  tScientist, forensicTherapeutic activities;Therapeutic exercise;Functional mobility training;Balance training;Neuromuscular re-education;Patient/family education;Manual techniques    PT Next Visit Plan assess response to HEP exercise ; progress endurance exercises as able. Continue strengthening as able.    PT Home Exercise Plan Access Code: [email or web address]   Consulted and Agree with Plan of Care Patient           Patient will benefit from skilled therapeutic intervention in order to improve the following deficits and impairments:  Abnormal gait,Decreased activity tolerance,Decreased balance,Decreased endurance,Decreased mobility,Decreased strength,Difficulty walking  Visit Diagnosis: Unsteadiness on feet  Muscle weakness (generalized)  Other abnormalities of gait and mobility  Abnormal posture     Problem List Patient Active Problem List   Diagnosis Date Noted  . Acute respiratory failure with hypoxia ([place] 07/19/2020  . Chronic diastolic CHF (congestive heart failure) ([place] 07/19/2020  . Bronchospasm 07/19/2020  . Elevated troponin 07/19/2020  . Acute nasopharyngitis 05/18/2020  . Incontinence 03/17/2020  . Need for immunization against influenza 01/05/2020  . Herpes simplex virus (HSV) infection of buttock 01/05/2020  . OSA (obstructive sleep apnea) 07/25/2019  . Prediabetes 07/25/2019  . Pneumonia 02/19/2019  . Lateral epicondylitis of left elbow 01/13/2019  . Normocytic anemia 05/29/2018  . Scleroderma ([place] 04/22/2018  . Cough with hemoptysis 04/22/2018  . Swelling of foot joint, right 04/08/2018  . Positive ANA (antinuclear antibody) 09/23/2016  . Eczema 01/27/2016  . History of pulmonary embolism 09/12/2015  . Long term current use of anticoagulant therapy 09/06/2015  . Chemotherapy-induced peripheral neuropathy ([place] 06/13/2015  . CKD (chronic kidney disease) stage 3, GFR 30-59  ml/min ([place]) 05/12/2015  . GERD (gastroesophageal reflux disease) 05/12/2015  .  Ovarian carcinosarcoma, right ([place]) 03/29/2015  . Endometrial ca ([place]) 03/29/2015  . Ventral hernia without obstruction or gangrene   . Morbid obesity ([place]) 02/19/2015  . Essential hypertension, benign   . Stress at home 12/14/2014  . Left knee pain 08/04/2013  . Seasonal allergies 07/29/2013    [facility] April Ma L Kimberla Driskill PT, DPT 09/06/2020, 10:17 AM  [facility] [address] [place], [place], [address] Phone: [phone]   Fax:  [phone]  Name: [name] MRN: [id number] Date of Birth: 1965-11-06

## 2020-09-07 ENCOUNTER — Telehealth: Payer: Self-pay | Admitting: Nurse Practitioner

## 2020-09-07 MED ORDER — DICYCLOMINE HCL 10 MG PO CAPS: 10.0000 mg | ORAL_CAPSULE | Freq: Two times a day (BID) | ORAL | 0 refills | Status: DC

## 2020-09-07 NOTE — Telephone Encounter (Signed)
Patient calling requesting refill for Bentyl.. Plz advise  thanks

## 2020-09-07 NOTE — Telephone Encounter (Signed)
RX sent

## 2020-09-10 ENCOUNTER — Other Ambulatory Visit: Payer: Self-pay

## 2020-09-10 ENCOUNTER — Encounter (HOSPITAL_BASED_OUTPATIENT_CLINIC_OR_DEPARTMENT_OTHER): Payer: Self-pay | Admitting: Physical Therapy

## 2020-09-10 ENCOUNTER — Ambulatory Visit (HOSPITAL_BASED_OUTPATIENT_CLINIC_OR_DEPARTMENT_OTHER): Payer: Medicare HMO | Attending: Family Medicine | Admitting: Physical Therapy

## 2020-09-10 DIAGNOSIS — R2681 Unsteadiness on feet: Secondary | ICD-10-CM | POA: Insufficient documentation

## 2020-09-10 DIAGNOSIS — J189 Pneumonia, unspecified organism: Secondary | ICD-10-CM | POA: Insufficient documentation

## 2020-09-10 DIAGNOSIS — M542 Cervicalgia: Secondary | ICD-10-CM

## 2020-09-10 DIAGNOSIS — R531 Weakness: Secondary | ICD-10-CM | POA: Insufficient documentation

## 2020-09-10 DIAGNOSIS — M6281 Muscle weakness (generalized): Secondary | ICD-10-CM

## 2020-09-10 DIAGNOSIS — G8929 Other chronic pain: Secondary | ICD-10-CM

## 2020-09-10 DIAGNOSIS — M25612 Stiffness of left shoulder, not elsewhere classified: Secondary | ICD-10-CM

## 2020-09-10 DIAGNOSIS — R293 Abnormal posture: Secondary | ICD-10-CM

## 2020-09-10 DIAGNOSIS — M25512 Pain in left shoulder: Secondary | ICD-10-CM

## 2020-09-10 DIAGNOSIS — R2689 Other abnormalities of gait and mobility: Secondary | ICD-10-CM

## 2020-09-10 NOTE — Therapy (Addendum)
[facility] [address] [place], [place], [address] Phone: [phone]   Fax:  [phone]  Physical Therapy Treatment  Patient Details  Name: [name] MRN: [id number] Date of Birth: 1965-12-21 Referring Provider (PT): Martyn Malay, MD   Encounter Date: 09/10/2020     Past Medical History:  Diagnosis Date   Allergy    Anemia    Anxiety    Arthritis    Blood transfusion without reported diagnosis    Cough    Cough 06/11/2018   Difficulty sleeping 06/11/2018   Diverticulitis with perforation 2010   DVT (deep vein thrombosis) in pregnancy 11/24/2016   DVT, lower extremity ([place])    Endometrial cancer ([place])    Enlarged lymph nodes 08/13/2017   Family history of adverse reaction to anesthesia    pts daughter had difficulty awakening following anesthesia, long time to wake up   GERD (gastroesophageal reflux disease)    Headache    Hemoptysis 04/22/2018   History of bronchitis    History of ear infections    Hospital discharge follow-up 03/02/2019   Hx of migraines    Hyperlipidemia    Hypertension    IBS (irritable bowel syndrome)    Kidney infection    Lupus (systemic lupus erythematosus) ([place]) 02/2017   Morbid obesity ([place])    Neck pain 01/13/2019   Ovarian cancer ([place]) dx'd 01/2015   PONV (postoperative nausea and vomiting)    Port-A-Cath in place    Right side   Portacath in place 09/12/2015   Pre-diabetes    Pyelonephritis    Right knee pain 11/28/2011   Scleroderma ([place])    Screen for colon cancer 03/02/2019   Slow rate of speech 08/22/2015   Chronic from CVA.  She also has loss of left nasolabial fold and slight left facial droop/small asymmetry on the left side secondary to CVA   UTI (urinary tract infection) 05/07/2018    Past Surgical History:  Procedure Laterality Date   ABDOMINAL HYSTERECTOMY N/A 03/13/2015   Procedure: TOTAL HYSTERECTOMY ABDOMINAL BILATERAL SALPINGO OOPHORECTOMY RADICAL TUMOR  [place];  Surgeon: Everitt Amber, MD;  [facility];  Service: Gynecology;  Laterality: N/A;   BOWEL RESECTION  03/13/2015   Procedure: SMALL BOWEL RESECTION;  Surgeon: Everitt Amber, MD;  [facility];  Service: Gynecology;;   [place] and 1984   COLONOSCOPY WITH PROPOFOL N/A 07/19/2020   Procedure: COLONOSCOPY WITH PROPOFOL;  Surgeon: Doran Stabler, MD;  [facility];  Service: Gastroenterology;  Laterality: N/A;   COLOSTOMY  2008   COLOSTOMY TAKEDOWN     ESOPHAGOGASTRODUODENOSCOPY (EGD) WITH PROPOFOL N/A 12/21/2018   Procedure: ESOPHAGOGASTRODUODENOSCOPY (EGD) WITH PROPOFOL;  Surgeon: Doran Stabler, MD;  [facility];  Service: Gastroenterology;  Laterality: N/A;   HEMOSTASIS CLIP PLACEMENT  07/19/2020   Procedure: HEMOSTASIS CLIP PLACEMENT;  Surgeon: Doran Stabler, MD;  Location: [facility];  Service: Gastroenterology;;   LAPAROTOMY N/A 03/13/2015   Procedure: EXPLORATORY LAPAROTOMY;  Surgeon: Everitt Amber, MD;  [facility];  Service: Gynecology;  Laterality: N/A;   POLYPECTOMY  07/19/2020   Procedure: POLYPECTOMY;  Surgeon: Doran Stabler, MD;  Location: [facility];  Service: Gastroenterology;;   TONSILLECTOMY AND [place]  03/13/2015   Procedure: HERNIA REPAIR VENTRAL ADULT;  Surgeon: Everitt Amber, MD;  [facility];  Service: Gynecology;;    There were no vitals filed for this visit.  TREATMENT Pt seen for aquatic therapy today.  Treatment took place in water 3.25-4.8 ft in depth at the [employer] pool. Temp of water was 91.  Pt entered/exited the pool via stairs step to pattern independently with bilat rail.  Warm up: forward, backward and side stepping/walking cues for increased step length, increased speed, hand placement to increase resistance x 8 lengths sideways in pool. Stretching: gastroc, hamstrings, LB on wall and sitting on bench of  pool.  Strengthening Sitting:  knee flex/ext 2 x 5 reps with cues for increased speed STS from step submerged to chest 2x5 reps  Supine:  suspended using buoys, kicking x 5 minutes, add/abd 2 x 10 reps  Standing:  LE noodle pushdowns 2 x 10 reps bilaterally adding core strength and balance challenge by decreasing ue support from holding to wall to holding to barbell.  Progressed to using kick board pt with increased difficulty maintaining standing balance requiring cga.  Core: Strengthening and balance Planks: static and walking using kick board 3 x 10 reps cuing for increased speed Resisted walking pushing kickboard x 4 lengths. Core rotation challenging obliques, serratus and transverse abdominis pushing and pulling kick board.    Pt requires buoyancy for support and to offload joints with strengthening exercises. Viscosity of the water is needed for resistance of strengthening; water current perturbations provides challenge to standing balance unsupported, requiring increased core activation.                            PT Short Term Goals - 08/27/20 0955       PT SHORT TERM GOAL #1   Title Pt will be independent with initial HEP for improved carryover    Baseline initial HEP given    Time 3    Period Days    Status Achieved    Target Date 08/27/20      PT SHORT TERM GOAL #2   Title Pt will be compliant with walking program for improved endurance and mobility    Time 3    Period Weeks    Status Achieved    Target Date 08/27/20               PT Long Term Goals - 09/26/20 0929       PT LONG TERM GOAL #1   Title Pt will improve FOTO function score to no less than 61% in order to improve confidence and functional ability    Baseline 36% function; updated - 41%; update 09/26/20 - 53%    Time 8    Period Weeks    Status Partially Met      PT LONG TERM GOAL #2   Title Pt will improve reps in 30 Sec Sit to Stand to no less than 12 reps in  order to improve functional mobility    Baseline 10 reps - update 09/26/20: 13 reps    Time 8    Period Weeks    Status Achieved      PT LONG TERM GOAL #3   Title Pt will increase distance in 6MWT to no less than 940f with stable O2 sats in order to improve endurance and community navigation    Baseline see flowsheet; update 09/26/20: 10273f   Time 8    Period Weeks    Status Achieved            Clinical assessment Pt engaged in aquatic therapy putting forth excellent effort.  Reports no pain/discomfort with activity.  She is directed through strengthening, balance and aerobic capacity challenges which she reports made her feel as though she has exercised well. Completed STS exercised submerged strengtheing muscle without increase knee pain. Was able to get her heart rate increased with some exertion SOB.        Patient will benefit from skilled therapeutic intervention in order to improve the following deficits and impairments:  Abnormal gait, Decreased activity tolerance, Decreased balance, Decreased endurance, Decreased mobility, Decreased strength, Difficulty walking  Visit Diagnosis: Unsteadiness on feet  Muscle weakness (generalized)  Other abnormalities of gait and mobility  Abnormal posture  Cervicalgia  Stiffness of left shoulder, not elsewhere classified  Chronic left shoulder pain  Chronic right shoulder pain     Problem List Patient Active Problem List   Diagnosis Date Noted   Acute respiratory failure with hypoxia ([facility]) 07/19/2020   Chronic diastolic CHF (congestive heart failure) ([place]) 07/19/2020   Bronchospasm 07/19/2020   Elevated troponin 07/19/2020   Acute nasopharyngitis 05/18/2020   Incontinence 03/17/2020   Need for immunization against influenza 01/05/2020   Herpes simplex virus (HSV) infection of buttock 01/05/2020   OSA (obstructive sleep apnea) 07/25/2019   Prediabetes 07/25/2019   Pneumonia 02/19/2019   Lateral epicondylitis of  left elbow 01/13/2019   Normocytic anemia 05/29/2018   Scleroderma ([place]) 04/22/2018   Cough with hemoptysis 04/22/2018   Swelling of foot joint, right 04/08/2018   Positive ANA (antinuclear antibody) 09/23/2016   Eczema 01/27/2016   History of pulmonary embolism 09/12/2015   Long term current use of anticoagulant therapy 09/06/2015   Chemotherapy-induced peripheral neuropathy ([place]) 06/13/2015   CKD (chronic kidney disease) stage 3, GFR 30-59 ml/min ([place]) 05/12/2015   GERD (gastroesophageal reflux disease) 05/12/2015   Ovarian carcinosarcoma, right ([place]) 03/29/2015   Endometrial ca ([place]) 03/29/2015   Ventral hernia without obstruction or gangrene    Morbid obesity ([place]) 02/19/2015   Essential hypertension, benign    Stress at home 12/14/2014   Left knee pain 08/04/2013   Seasonal allergies 07/29/2013    Vedia Pereyra MPT 10/16/2020, 4:08 PM  [facility] [address] [place], [place], [address] Phone: [phone]   Fax:  [phone]  Name: [name] MRN: [id number] Date of Birth: 22-Jun-1965

## 2020-09-11 ENCOUNTER — Other Ambulatory Visit: Payer: Self-pay | Admitting: Nurse Practitioner

## 2020-09-12 ENCOUNTER — Other Ambulatory Visit: Payer: Self-pay

## 2020-09-12 ENCOUNTER — Ambulatory Visit: Payer: Medicare HMO | Admitting: Physical Therapy

## 2020-09-12 DIAGNOSIS — M542 Cervicalgia: Secondary | ICD-10-CM

## 2020-09-12 DIAGNOSIS — R2681 Unsteadiness on feet: Secondary | ICD-10-CM

## 2020-09-12 DIAGNOSIS — R2689 Other abnormalities of gait and mobility: Secondary | ICD-10-CM

## 2020-09-12 DIAGNOSIS — M6281 Muscle weakness (generalized): Secondary | ICD-10-CM

## 2020-09-12 DIAGNOSIS — R293 Abnormal posture: Secondary | ICD-10-CM

## 2020-09-12 NOTE — Therapy (Signed)
[place], [place], [address] Phone: [phone]   Fax:  [phone]  Physical Therapy Treatment  Patient Details  Name: [name] MRN: [id number] Date of Birth: 1966/03/08 Referring Provider (PT): Martyn Malay, MD   Encounter Date: 09/12/2020   PT End of Session - 09/12/20 0945    Visit Number 11    Number of Visits 17    Date for PT Re-Evaluation 10/03/20    Authorization Type [employer] MCR - FOTO 6th and 10th    PT Start Time 0935    PT Stop Time 1015    PT Time Calculation (min) 40 min    Equipment Utilized During Treatment Other (comment)    Activity Tolerance Patient tolerated treatment well    Behavior During Therapy [facility] for tasks assessed/performed           Past Medical History:  Diagnosis Date  . Allergy   . Anemia   . Anxiety   . Arthritis   . Blood transfusion without reported diagnosis   . Cough   . Cough 06/11/2018  . Difficulty sleeping 06/11/2018  . Diverticulitis with perforation 2010  . DVT (deep vein thrombosis) in pregnancy 11/24/2016  . DVT, lower extremity ([place])   . Endometrial cancer ([place])   . Enlarged lymph nodes 08/13/2017  . Family history of adverse reaction to anesthesia    pts daughter had difficulty awakening following anesthesia, long time to wake up  . GERD (gastroesophageal reflux disease)   . Headache   . Hemoptysis 04/22/2018  . History of bronchitis   . History of ear infections   . Hospital discharge follow-up 03/02/2019  . Hx of migraines   . Hyperlipidemia   . Hypertension   . IBS (irritable bowel syndrome)   . Kidney infection   . Lupus (systemic lupus erythematosus) ([place]) 02/2017  . Morbid obesity ([place])   . Neck pain 01/13/2019  . Ovarian cancer ([place]) dx'd 01/2015  . PONV (postoperative nausea and vomiting)   . Port-A-Cath in place    Right side  . Portacath in place 09/12/2015  . Pre-diabetes   . Pyelonephritis   . Right knee pain  11/28/2011  . Scleroderma ([place])   . Screen for colon cancer 03/02/2019  . Slow rate of speech 08/22/2015   Chronic from CVA.  She also has loss of left nasolabial fold and slight left facial droop/small asymmetry on the left side secondary to CVA  . UTI (urinary tract infection) 05/07/2018    Past Surgical History:  Procedure Laterality Date  . ABDOMINAL HYSTERECTOMY N/A 03/13/2015   Procedure: TOTAL HYSTERECTOMY ABDOMINAL BILATERAL SALPINGO OOPHORECTOMY RADICAL TUMOR [place];  Surgeon: Everitt Amber, MD;  [facility];  Service: Gynecology;  Laterality: N/A;  . BOWEL RESECTION  03/13/2015   Procedure: SMALL BOWEL RESECTION;  Surgeon: Everitt Amber, MD;  [facility]: [facility];  Service: Gynecology;;  . [place] and 1984  . COLONOSCOPY WITH PROPOFOL N/A 07/19/2020   Procedure: COLONOSCOPY WITH PROPOFOL;  Surgeon: Doran Stabler, MD;  [facility];  Service: Gastroenterology;  Laterality: N/A;  . COLOSTOMY  2008  . COLOSTOMY TAKEDOWN    . ESOPHAGOGASTRODUODENOSCOPY (EGD) WITH PROPOFOL N/A 12/21/2018   Procedure: ESOPHAGOGASTRODUODENOSCOPY (EGD) WITH PROPOFOL;  Surgeon: Doran Stabler, MD;  [facility];  Service: Gastroenterology;  Laterality: N/A;  . HEMOSTASIS CLIP PLACEMENT  07/19/2020   Procedure: HEMOSTASIS CLIP PLACEMENT;  Surgeon: Doran Stabler, MD;  [facility];  Service: Gastroenterology;;  . LAPAROTOMY N/A 03/13/2015   Procedure: EXPLORATORY LAPAROTOMY;  Surgeon: Everitt Amber, MD;  [facility];  Service: Gynecology;  Laterality: N/A;  . POLYPECTOMY  07/19/2020   Procedure: POLYPECTOMY;  Surgeon: Doran Stabler, MD;  [facility];  Service: Gastroenterology;;  . [place]  . VENTRAL HERNIA REPAIR  03/13/2015   Procedure: HERNIA REPAIR VENTRAL ADULT;  Surgeon: Everitt Amber, MD;  [facility];  Service: Gynecology;;    There were no vitals filed for this visit.   Subjective Assessment -  09/12/20 0937    Subjective Pt states she loved the water. Pt reports continued joint aches -- found that her current cholesterol medication has statins in it. She has called her doctor already to address this.    Pertinent History Numerous and complex PMH - recent hospitalization 07/19/20 - 07/21/20 for pneumonia    Limitations Walking;Standing    How long can you sit comfortably? indefinite    How long can you stand comfortably? 45-60 minutes    How long can you walk comfortably? 5 minutes    Patient Stated Goals pt would like to improve activty tolerance in order to get back to start with aquatic therapy program again    Currently in Pain? Yes    Pain Score 7                              [facility] Adult PT Treatment/Exercise - 09/12/20 0001      Knee/Hip Exercises: Aerobic   Nustep L6 UE/LE x 11 min      Knee/Hip Exercises: Standing   Heel Raises 20 reps    Hip Flexion Stengthening;Both;2 sets;Knee straight    Lateral Step Up Right;Left;10 reps    Forward Step Up Right;Left;10 reps;Step Height: 4";Hand Hold: 0    Other Standing Knee Exercises Lateral band walk green tband x30'; forward/backward monster walk green tband x30'; step tap on 4" step x10 forward    Other Standing Knee Exercises hamstring curl green tband 2x10; Deadlift 10# 2x10                    PT Short Term Goals - 08/27/20 0955      PT SHORT TERM GOAL #1   Title Pt will be independent with initial HEP for improved carryover    Baseline initial HEP given    Time 3    Period Days    Status Achieved    Target Date 08/27/20      PT SHORT TERM GOAL #2   Title Pt will be compliant with walking program for improved endurance and mobility    Time 3    Period Weeks    Status Achieved    Target Date 08/27/20             PT Long Term Goals - 08/27/20 0954      PT LONG TERM GOAL #1   Title Pt will improve FOTO function score to no less than 61% in order to improve confidence and  functional ability    Baseline 36% function; updated - 41%    Time 8    Period Weeks    Status New    Target Date 10/01/20      PT LONG TERM GOAL #2   Title Pt will improve reps in 30 Sec Sit to Stand to no less than  12 reps in order to improve functional mobility    Baseline 10 reps    Time 8    Period Weeks    Status New    Target Date 10/01/20      PT LONG TERM GOAL #3   Title Pt will increase distance in 6MWT to no less than 949ft with stable O2 sats in order to improve endurance and community navigation    Baseline see flowsheet    Time 8    Period Weeks    Status New    Target Date 10/01/20                 Plan - 09/12/20 1013    Clinical Impression Statement Pt is demonstrating increasing strength. Treatment focused on increasing endurance and strength. Able to progress deadlifts to 10#. Working on aerobics and strengthening with 4" step. Pt able to tolerate well despite joint pain.    Personal Factors and Comorbidities Comorbidity 3+;Fitness    Comorbidities SLE; HTN; Cancer    Examination-Activity Limitations Stand;Squat;Stairs;Locomotion Level;Carry;Lift;Transfers    Examination-Participation Restrictions Yard Work;Shop;Community Activity    Stability/Clinical Decision Making Stable/Uncomplicated    Rehab Potential Good    PT Frequency 2x / week    PT Duration 8 weeks    PT Treatment/Interventions ADLs/Self Care Home Management;Aquatic Therapy;Electrical Stimulation;Moist Heat;Cryotherapy;Ultrasound;Gait Scientist, forensic;Therapeutic activities;Therapeutic exercise;Functional mobility training;Balance training;Neuromuscular re-education;Patient/family education;Manual techniques    PT Next Visit Plan Please perform FOTO on next land visit! Advance aerobic capacity exercises; continue general strengthening.    PT Home Exercise Plan Access Code: [email or web address]    Consulted and Agree with Plan of Care Patient           Patient will benefit from skilled  therapeutic intervention in order to improve the following deficits and impairments:  Abnormal gait,Decreased activity tolerance,Decreased balance,Decreased endurance,Decreased mobility,Decreased strength,Difficulty walking  Visit Diagnosis: Unsteadiness on feet  Muscle weakness (generalized)  Other abnormalities of gait and mobility  Abnormal posture  Cervicalgia     Problem List Patient Active Problem List   Diagnosis Date Noted  . Acute respiratory failure with hypoxia ([place]) 07/19/2020  . Chronic diastolic CHF (congestive heart failure) ([place]) 07/19/2020  . Bronchospasm 07/19/2020  . Elevated troponin 07/19/2020  . Acute nasopharyngitis 05/18/2020  . Incontinence 03/17/2020  . Need for immunization against influenza 01/05/2020  . Herpes simplex virus (HSV) infection of buttock 01/05/2020  . OSA (obstructive sleep apnea) 07/25/2019  . Prediabetes 07/25/2019  . Pneumonia 02/19/2019  . Lateral epicondylitis of left elbow 01/13/2019  . Normocytic anemia 05/29/2018  . Scleroderma ([place]) 04/22/2018  . Cough with hemoptysis 04/22/2018  . Swelling of foot joint, right 04/08/2018  . Positive ANA (antinuclear antibody) 09/23/2016  . Eczema 01/27/2016  . History of pulmonary embolism 09/12/2015  . Long term current use of anticoagulant therapy 09/06/2015  . Chemotherapy-induced peripheral neuropathy ([place]) 06/13/2015  . CKD (chronic kidney disease) stage 3, GFR 30-59 ml/min ([facility]) 05/12/2015  . GERD (gastroesophageal reflux disease) 05/12/2015  . Ovarian carcinosarcoma, right ([place]) 03/29/2015  . Endometrial ca ([place]) 03/29/2015  . Ventral hernia without obstruction or gangrene   . Morbid obesity ([place]) 02/19/2015  . Essential hypertension, benign   . Stress at home 12/14/2014  . Left knee pain 08/04/2013  . Seasonal allergies 07/29/2013    Felisa Zechman April Gordy Levan 09/12/2020, 10:18 AM  [facility], [place] [address] [place], [place], [address] Phone: [phone]   Fax:  [phone]  Name: Emonni Depasquale  [place] MRN: [id number] Date of Birth: 09-Jun-1965

## 2020-09-17 ENCOUNTER — Encounter: Payer: Medicare HMO | Admitting: Physical Therapy

## 2020-09-17 ENCOUNTER — Encounter (HOSPITAL_BASED_OUTPATIENT_CLINIC_OR_DEPARTMENT_OTHER): Payer: Self-pay | Admitting: Physical Therapy

## 2020-09-17 ENCOUNTER — Other Ambulatory Visit: Payer: Self-pay

## 2020-09-17 ENCOUNTER — Ambulatory Visit (HOSPITAL_BASED_OUTPATIENT_CLINIC_OR_DEPARTMENT_OTHER): Payer: Medicare HMO | Admitting: Physical Therapy

## 2020-09-17 DIAGNOSIS — R293 Abnormal posture: Secondary | ICD-10-CM

## 2020-09-17 DIAGNOSIS — M25511 Pain in right shoulder: Secondary | ICD-10-CM

## 2020-09-17 DIAGNOSIS — M6281 Muscle weakness (generalized): Secondary | ICD-10-CM

## 2020-09-17 DIAGNOSIS — M25512 Pain in left shoulder: Secondary | ICD-10-CM

## 2020-09-17 DIAGNOSIS — M542 Cervicalgia: Secondary | ICD-10-CM

## 2020-09-17 DIAGNOSIS — J189 Pneumonia, unspecified organism: Secondary | ICD-10-CM | POA: Diagnosis not present

## 2020-09-17 DIAGNOSIS — R2681 Unsteadiness on feet: Secondary | ICD-10-CM

## 2020-09-17 DIAGNOSIS — M25612 Stiffness of left shoulder, not elsewhere classified: Secondary | ICD-10-CM

## 2020-09-17 DIAGNOSIS — R2689 Other abnormalities of gait and mobility: Secondary | ICD-10-CM

## 2020-09-17 NOTE — Therapy (Addendum)
[facility] [address] [place], [place], [address] Phone: [phone]   Fax:  [phone]  Physical Therapy Treatment  Patient Details  Name: [name] MRN: [id number] Date of Birth: 10-07-1965 Referring Provider (PT): Martyn Malay, MD  Subjective Feeling great.  No pain after last visit other than some muscle soarness but a good feeling, like I used my muscles" Encounter Date: 09/17/2020     Past Medical History:  Diagnosis Date   Allergy    Anemia    Anxiety    Arthritis    Blood transfusion without reported diagnosis    Cough    Cough 06/11/2018   Difficulty sleeping 06/11/2018   Diverticulitis with perforation 2010   DVT (deep vein thrombosis) in pregnancy 11/24/2016   DVT, lower extremity ([facility])    Endometrial cancer ([place])    Enlarged lymph nodes 08/13/2017   Family history of adverse reaction to anesthesia    pts daughter had difficulty awakening following anesthesia, long time to wake up   GERD (gastroesophageal reflux disease)    Headache    Hemoptysis 04/22/2018   History of bronchitis    History of ear infections    Hospital discharge follow-up 03/02/2019   Hx of migraines    Hyperlipidemia    Hypertension    IBS (irritable bowel syndrome)    Kidney infection    Lupus (systemic lupus erythematosus) ([place]) 02/2017   Morbid obesity ([place])    Neck pain 01/13/2019   Ovarian cancer ([place]) dx'd 01/2015   PONV (postoperative nausea and vomiting)    Port-A-Cath in place    Right side   Portacath in place 09/12/2015   Pre-diabetes    Pyelonephritis    Right knee pain 11/28/2011   Scleroderma ([place])    Screen for colon cancer 03/02/2019   Slow rate of speech 08/22/2015   Chronic from CVA.  She also has loss of left nasolabial fold and slight left facial droop/small asymmetry on the left side secondary to CVA   UTI (urinary tract infection) 05/07/2018    Past Surgical History:  Procedure Laterality Date    ABDOMINAL HYSTERECTOMY N/A 03/13/2015   Procedure: TOTAL HYSTERECTOMY ABDOMINAL BILATERAL SALPINGO OOPHORECTOMY RADICAL TUMOR [place];  Surgeon: Everitt Amber, MD;  [facility];  Service: Gynecology;  Laterality: N/A;   BOWEL RESECTION  03/13/2015   Procedure: SMALL BOWEL RESECTION;  Surgeon: Everitt Amber, MD;  [facility];  Service: Gynecology;;   [place] and 1984   COLONOSCOPY WITH PROPOFOL N/A 07/19/2020   Procedure: COLONOSCOPY WITH PROPOFOL;  Surgeon: Doran Stabler, MD;  [facility];  Service: Gastroenterology;  Laterality: N/A;   COLOSTOMY  2008   COLOSTOMY TAKEDOWN     ESOPHAGOGASTRODUODENOSCOPY (EGD) WITH PROPOFOL N/A 12/21/2018   Procedure: ESOPHAGOGASTRODUODENOSCOPY (EGD) WITH PROPOFOL;  Surgeon: Doran Stabler, MD;  [facility];  Service: Gastroenterology;  Laterality: N/A;   HEMOSTASIS CLIP PLACEMENT  07/19/2020   Procedure: HEMOSTASIS CLIP PLACEMENT;  Surgeon: Doran Stabler, MD;  Location: [facility];  Service: Gastroenterology;;   LAPAROTOMY N/A 03/13/2015   Procedure: EXPLORATORY LAPAROTOMY;  Surgeon: Everitt Amber, MD;  [facility];  Service: Gynecology;  Laterality: N/A;   POLYPECTOMY  07/19/2020   Procedure: POLYPECTOMY;  Surgeon: Doran Stabler, MD;  Location: [facility];  Service: Gastroenterology;;   TONSILLECTOMY AND [place]  03/13/2015   Procedure: HERNIA REPAIR VENTRAL ADULT;  Surgeon: Everitt Amber, MD;  [facility];  Service: Gynecology;;    There were no vitals filed for this visit.       TREATMENT Pt seen for aquatic therapy today.  Treatment took place in water 3.25-4.8 ft in depth at the [facility] pool. Temp of water was 91.  Pt entered/exited the pool via stairs step to pattern independently with bilat rail.   Warm up: forward, backward and side stepping/walking cues for increased step length, increased speed, hand placement to increase resistance  x 8 lengths sideways in pool. Stretching: gastroc, hamstrings, LB on wall and sitting on bench of pool.    Sitting: knee flex/ext 2 x 10 reps with cues for increased speed Hip hinges 2 x 10 reps cuing for proper technique     Standing:  LE noodle kick downs 2 x 10 reps bilaterally challenge holding to barbell.   Kick board kick downs x 10 reps bilaterally holding to wall, cuing for tight core. Hip flex, adduction/abduction and extension 2 x 10 reps with foam ankle cuff holding to wall Stretching hip flexors using noodle facing wall, advanced to knee pull through for strengthening x 10 reps.   Core:Strengthening and balance Planks: static using kick board 3 x 10 reps cuing for increased speed Core rotation challenging obliques, serratus and transverse abdominis pushing and pulling kick board. 3 x 10 each direction     Pt requires buoyancy for support and to offload joints with strengthening exercises. Viscosity of the water is needed for resistance of strengthening; water current perturbations provides challenge to standing balance unsupported, requiring increased core activation                           PT Short Term Goals - 08/27/20 0955       PT SHORT TERM GOAL #1   Title Pt will be independent with initial HEP for improved carryover    Baseline initial HEP given    Time 3    Period Days    Status Achieved    Target Date 08/27/20      PT SHORT TERM GOAL #2   Title Pt will be compliant with walking program for improved endurance and mobility    Time 3    Period Weeks    Status Achieved    Target Date 08/27/20               PT Long Term Goals - 09/26/20 0929       PT LONG TERM GOAL #1   Title Pt will improve FOTO function score to no less than 61% in order to improve confidence and functional ability    Baseline 36% function; updated - 41%; update 09/26/20 - 53%    Time 8    Period Weeks    Status Partially Met      PT LONG TERM GOAL #2    Title Pt will improve reps in 30 Sec Sit to Stand to no less than 12 reps in order to improve functional mobility    Baseline 10 reps - update 09/26/20: 13 reps    Time 8    Period Weeks    Status Achieved      PT LONG TERM GOAL #3   Title Pt will increase distance in 6MWT to no less than 967f with stable O2 sats in order to improve endurance and community navigation    Baseline see flowsheet; update 09/26/20: [id number]  Time 8    Period Weeks    Status Achieved                    Patient will benefit from skilled therapeutic intervention in order to improve the following deficits and impairments:  Abnormal gait, Decreased activity tolerance, Decreased balance, Decreased endurance, Decreased mobility, Decreased strength, Difficulty walking  Visit Diagnosis: Unsteadiness on feet  Muscle weakness (generalized)  Other abnormalities of gait and mobility  Abnormal posture  Chronic right shoulder pain  Chronic left shoulder pain  Stiffness of left shoulder, not elsewhere classified  Cervicalgia   Clinical Assessment Pt without discomfort prior or during session.  Focus today on core strengthening, balance improvement as well as aerobic capacity training.  She is eager to improve. Advances her ability to maintain balance activating and holding core while completing  exercises.  Problem List Patient Active Problem List   Diagnosis Date Noted   Acute respiratory failure with hypoxia ([place]) 07/19/2020   Chronic diastolic CHF (congestive heart failure) ([place]) 07/19/2020   Bronchospasm 07/19/2020   Elevated troponin 07/19/2020   Acute nasopharyngitis 05/18/2020   Incontinence 03/17/2020   Need for immunization against influenza 01/05/2020   Herpes simplex virus (HSV) infection of buttock 01/05/2020   OSA (obstructive sleep apnea) 07/25/2019   Prediabetes 07/25/2019   Pneumonia 02/19/2019   Lateral epicondylitis of left elbow 01/13/2019   Normocytic anemia 05/29/2018    Scleroderma ([place]) 04/22/2018   Cough with hemoptysis 04/22/2018   Swelling of foot joint, right 04/08/2018   Positive ANA (antinuclear antibody) 09/23/2016   Eczema 01/27/2016   History of pulmonary embolism 09/12/2015   Long term current use of anticoagulant therapy 09/06/2015   Chemotherapy-induced peripheral neuropathy ([place]) 06/13/2015   CKD (chronic kidney disease) stage 3, GFR 30-59 ml/min ([place]) 05/12/2015   GERD (gastroesophageal reflux disease) 05/12/2015   Ovarian carcinosarcoma, right ([place]) 03/29/2015   Endometrial ca ([place]) 03/29/2015   Ventral hernia without obstruction or gangrene    Morbid obesity ([place]) 02/19/2015   Essential hypertension, benign    Stress at home 12/14/2014   Left knee pain 08/04/2013   Seasonal allergies 07/29/2013    Vedia Pereyra  MPT 10/17/2020, 2:17 PM  [facility] [address] [place], [place], [address] Phone: [phone]   Fax:  [phone]  Name: [name] MRN: [id number] Date of Birth: 28-Jan-1966  Addended [facility] Tharon Aquas) Aryn Safran MPT 10/17/20

## 2020-09-19 ENCOUNTER — Other Ambulatory Visit: Payer: Self-pay

## 2020-09-19 ENCOUNTER — Ambulatory Visit: Payer: Medicare HMO

## 2020-09-19 DIAGNOSIS — R2681 Unsteadiness on feet: Secondary | ICD-10-CM

## 2020-09-19 DIAGNOSIS — M6281 Muscle weakness (generalized): Secondary | ICD-10-CM

## 2020-09-19 DIAGNOSIS — R2689 Other abnormalities of gait and mobility: Secondary | ICD-10-CM

## 2020-09-19 NOTE — Therapy (Signed)
[place], [place], [address] Phone: [phone]   Fax:  [phone]  Physical Therapy Treatment  Patient Details  Name: [name] MRN: [id number] Date of Birth: 1965-08-28 Referring Provider (PT): Martyn Malay, MD   Encounter Date: 09/19/2020   PT End of Session - 09/19/20 0915     Visit Number 13    Number of Visits 17    Date for PT Re-Evaluation 10/03/20    Authorization Type [employer] MCR - FOTO 6th and 10th    PT Start Time 0915    PT Stop Time 0956    PT Time Calculation (min) 41 min    Equipment Utilized During Treatment Other (comment)    Activity Tolerance Patient tolerated treatment well             Past Medical History:  Diagnosis Date   Allergy    Anemia    Anxiety    Arthritis    Blood transfusion without reported diagnosis    Cough    Cough 06/11/2018   Difficulty sleeping 06/11/2018   Diverticulitis with perforation 2010   DVT (deep vein thrombosis) in pregnancy 11/24/2016   DVT, lower extremity ([place])    Endometrial cancer ([place])    Enlarged lymph nodes 08/13/2017   Family history of adverse reaction to anesthesia    pts daughter had difficulty awakening following anesthesia, long time to wake up   GERD (gastroesophageal reflux disease)    Headache    Hemoptysis 04/22/2018   History of bronchitis    History of ear infections    Hospital discharge follow-up 03/02/2019   Hx of migraines    Hyperlipidemia    Hypertension    IBS (irritable bowel syndrome)    Kidney infection    Lupus (systemic lupus erythematosus) ([place]) 02/2017   Morbid obesity ([place])    Neck pain 01/13/2019   Ovarian cancer ([place]) dx'd 01/2015   PONV (postoperative nausea and vomiting)    Port-A-Cath in place    Right side   Portacath in place 09/12/2015   Pre-diabetes    Pyelonephritis    Right knee pain 11/28/2011   Scleroderma ([place])    Screen for colon cancer 03/02/2019   Slow rate of speech  08/22/2015   Chronic from CVA.  She also has loss of left nasolabial fold and slight left facial droop/small asymmetry on the left side secondary to CVA   UTI (urinary tract infection) 05/07/2018    Past Surgical History:  Procedure Laterality Date   ABDOMINAL HYSTERECTOMY N/A 03/13/2015   Procedure: TOTAL HYSTERECTOMY ABDOMINAL BILATERAL SALPINGO OOPHORECTOMY RADICAL TUMOR [place];  Surgeon: Everitt Amber, MD;  [facility];  Service: Gynecology;  Laterality: N/A;   BOWEL RESECTION  03/13/2015   Procedure: SMALL BOWEL RESECTION;  Surgeon: Everitt Amber, MD;  [facility]: [facility];  Service: Gynecology;;   [place] and 1984   COLONOSCOPY WITH PROPOFOL N/A 07/19/2020   Procedure: COLONOSCOPY WITH PROPOFOL;  Surgeon: Doran Stabler, MD;  [facility];  Service: Gastroenterology;  Laterality: N/A;   COLOSTOMY  2008   COLOSTOMY TAKEDOWN     ESOPHAGOGASTRODUODENOSCOPY (EGD) WITH PROPOFOL N/A 12/21/2018   Procedure: ESOPHAGOGASTRODUODENOSCOPY (EGD) WITH PROPOFOL;  Surgeon: Doran Stabler, MD;  [facility];  Service: Gastroenterology;  Laterality: N/A;   HEMOSTASIS CLIP PLACEMENT  07/19/2020   Procedure: HEMOSTASIS CLIP PLACEMENT;  Surgeon: Doran Stabler, MD;  [facility];  Service: Gastroenterology;;  LAPAROTOMY N/A 03/13/2015   Procedure: EXPLORATORY LAPAROTOMY;  Surgeon: Everitt Amber, MD;  [facility];  Service: Gynecology;  Laterality: N/A;   POLYPECTOMY  07/19/2020   Procedure: POLYPECTOMY;  Surgeon: Doran Stabler, MD;  Location[facility];  Service: Gastroenterology;;   TONSILLECTOMY AND [place]  03/13/2015   Procedure: HERNIA REPAIR VENTRAL ADULT;  Surgeon: Everitt Amber, MD;  [facility];  Service: Gynecology;;    There were no vitals filed for this visit.   Subjective Assessment - 09/19/20 0915     Subjective Pt presents to PT with reports of slight muscle soreness after aquatics visit. Pt  had been fairly compliant with HEP until recent joint pain, possibly d/t recent introduction to statin medication. She has upcoming MD visit to address this. Pt is ready to begin PT treatment at this time.    Currently in Pain? Yes    Pain Score 6     Pain Location Knee    Pain Orientation Right                               [facility] Adult PT Treatment/Exercise - 09/19/20 0001       Knee/Hip Exercises: Aerobic   Nustep L6 UE/LE x 6 min while taking subjective      Knee/Hip Exercises: Standing   Heel Raises 20 reps    Hip Abduction 15 reps;Both    Hip Extension 15 reps;Both    Forward Step Up 10 reps;Both;Step Height: 6"      Knee/Hip Exercises: Supine   Bridges 2 sets;10 reps;Strengthening;Both    Straight Leg Raises 2 sets;10 reps;Both      Knee/Hip Exercises: Sidelying   Hip ABduction Limitations attempted - increased R knee pain    Clams 2x10 ea                      PT Short Term Goals - 08/27/20 0955       PT SHORT TERM GOAL #1   Title Pt will be independent with initial HEP for improved carryover    Baseline initial HEP given    Time 3    Period Days    Status Achieved    Target Date 08/27/20      PT SHORT TERM GOAL #2   Title Pt will be compliant with walking program for improved endurance and mobility    Time 3    Period Weeks    Status Achieved    Target Date 08/27/20               PT Long Term Goals - 08/27/20 0954       PT LONG TERM GOAL #1   Title Pt will improve FOTO function score to no less than 61% in order to improve confidence and functional ability    Baseline 36% function; updated - 41%    Time 8    Period Weeks    Status New    Target Date 10/01/20      PT LONG TERM GOAL #2   Title Pt will improve reps in 30 Sec Sit to Stand to no less than 12 reps in order to improve functional mobility    Baseline 10 reps    Time 8    Period Weeks    Status New    Target Date 10/01/20      PT LONG  TERM GOAL #3    Title Pt will increase distance in 6MWT to no less than 963ft with stable O2 sats in order to improve endurance and community navigation    Baseline see flowsheet    Time 8    Period Weeks    Status New    Target Date 10/01/20                   Plan - 09/19/20 1006     Clinical Impression Statement Pt was able to complete all prescribed exercises with no adverse effect, but did have increased pain in R knee with loaded strengthening position. Pt continues to be very motivated with therapy and is progressing well with both gym and aquatic sessions. PT will continue to progress exercises as able per pt tolerance.    PT Treatment/Interventions ADLs/Self Care Home Management;Aquatic Therapy;Electrical Stimulation;Moist Heat;Cryotherapy;Ultrasound;Gait Scientist, forensic;Therapeutic activities;Therapeutic exercise;Functional mobility training;Balance training;Neuromuscular re-education;Patient/family education;Manual techniques    PT Next Visit Plan Please perform FOTO on next land visit! Advance aerobic capacity exercises; continue general strengthening.    PT Home Exercise Plan Access Code: [email or web address]             Patient will benefit from skilled therapeutic intervention in order to improve the following deficits and impairments:  Abnormal gait, Decreased activity tolerance, Decreased balance, Decreased endurance, Decreased mobility, Decreased strength, Difficulty walking  Visit Diagnosis: Unsteadiness on feet  Muscle weakness (generalized)  Other abnormalities of gait and mobility     Problem List Patient Active Problem List   Diagnosis Date Noted   Acute respiratory failure with hypoxia (H[facility]) 07/19/2020   Chronic diastolic CHF (congestive heart failure) ([place]) 07/19/2020   Bronchospasm 07/19/2020   Elevated troponin 07/19/2020   Acute nasopharyngitis 05/18/2020   Incontinence 03/17/2020   Need for immunization against influenza 01/05/2020   Herpes simplex  virus (HSV) infection of buttock 01/05/2020   OSA (obstructive sleep apnea) 07/25/2019   Prediabetes 07/25/2019   Pneumonia 02/19/2019   Lateral epicondylitis of left elbow 01/13/2019   Normocytic anemia 05/29/2018   Scleroderma ([place]) 04/22/2018   Cough with hemoptysis 04/22/2018   Swelling of foot joint, right 04/08/2018   Positive ANA (antinuclear antibody) 09/23/2016   Eczema 01/27/2016   History of pulmonary embolism 09/12/2015   Long term current use of anticoagulant therapy 09/06/2015   Chemotherapy-induced peripheral neuropathy ([place]) 06/13/2015   CKD (chronic kidney disease) stage 3, GFR 30-59 ml/min ([place]) 05/12/2015   GERD (gastroesophageal reflux disease) 05/12/2015   Ovarian carcinosarcoma, right ([place]) 03/29/2015   Endometrial ca ([place]) 03/29/2015   Ventral hernia without obstruction or gangrene    Morbid obesity ([place]) 02/19/2015   Essential hypertension, benign    Stress at home 12/14/2014   Left knee pain 08/04/2013   Seasonal allergies 07/29/2013   Ward Chatters, PT, DPT 09/19/20 10:10 AM   [facility] [address] [place], [place], [address] Phone: [phone]   Fax:  [phone]  Name: [name] MRN: [id number] Date of Birth: December 23, 1965

## 2020-09-24 ENCOUNTER — Encounter (HOSPITAL_BASED_OUTPATIENT_CLINIC_OR_DEPARTMENT_OTHER): Payer: Self-pay | Admitting: Physical Therapy

## 2020-09-24 ENCOUNTER — Ambulatory Visit (HOSPITAL_BASED_OUTPATIENT_CLINIC_OR_DEPARTMENT_OTHER): Payer: Medicare HMO | Admitting: Physical Therapy

## 2020-09-24 ENCOUNTER — Other Ambulatory Visit: Payer: Self-pay

## 2020-09-24 ENCOUNTER — Encounter: Payer: Self-pay | Admitting: *Deleted

## 2020-09-24 DIAGNOSIS — R2689 Other abnormalities of gait and mobility: Secondary | ICD-10-CM

## 2020-09-24 DIAGNOSIS — J189 Pneumonia, unspecified organism: Secondary | ICD-10-CM | POA: Diagnosis not present

## 2020-09-24 DIAGNOSIS — M6281 Muscle weakness (generalized): Secondary | ICD-10-CM

## 2020-09-24 DIAGNOSIS — R2681 Unsteadiness on feet: Secondary | ICD-10-CM

## 2020-09-24 DIAGNOSIS — M25612 Stiffness of left shoulder, not elsewhere classified: Secondary | ICD-10-CM

## 2020-09-24 DIAGNOSIS — G8929 Other chronic pain: Secondary | ICD-10-CM

## 2020-09-24 DIAGNOSIS — M25512 Pain in left shoulder: Secondary | ICD-10-CM

## 2020-09-24 NOTE — Therapy (Addendum)
[place], [place], [address] Phone: [phone]   Fax:  [phone]  Physical Therapy Treatment  Patient Details  Name: [name] MRN: [id number] Date of Birth: April 28, 1965 Referring Provider (PT): Martyn Malay, MD   Encounter Date: 09/24/2020  Subjective Feeling good physically. Had a bad emotional weekend. Pain: no  Past Medical History:  Diagnosis Date   Allergy    Anemia    Anxiety    Arthritis    Blood transfusion without reported diagnosis    Cough    Cough 06/11/2018   Difficulty sleeping 06/11/2018   Diverticulitis with perforation 2010   DVT (deep vein thrombosis) in pregnancy 11/24/2016   DVT, lower extremity (HCC)    Endometrial cancer ([place])    Enlarged lymph nodes 08/13/2017   Family history of adverse reaction to anesthesia    pts daughter had difficulty awakening following anesthesia, long time to wake up   GERD (gastroesophageal reflux disease)    Headache    Hemoptysis 04/22/2018   History of bronchitis    History of ear infections    Hospital discharge follow-up 03/02/2019   Hx of migraines    Hyperlipidemia    Hypertension    IBS (irritable bowel syndrome)    Kidney infection    Lupus (systemic lupus erythematosus) ([place]) 02/2017   Morbid obesity ([place])    Neck pain 01/13/2019   Ovarian cancer ([place]) dx'd 01/2015   PONV (postoperative nausea and vomiting)    Port-A-Cath in place    Right side   Portacath in place 09/12/2015   Pre-diabetes    Pyelonephritis    Right knee pain 11/28/2011   Scleroderma ([place])    Screen for colon cancer 03/02/2019   Slow rate of speech 08/22/2015   Chronic from CVA.  She also has loss of left nasolabial fold and slight left facial droop/small asymmetry on the left side secondary to CVA   UTI (urinary tract infection) 05/07/2018    Past Surgical History:  Procedure Laterality Date   ABDOMINAL HYSTERECTOMY N/A 03/13/2015   Procedure:  TOTAL HYSTERECTOMY ABDOMINAL BILATERAL SALPINGO OOPHORECTOMY RADICAL TUMOR [place];  Surgeon: Everitt Amber, MD;  [facility];  Service: Gynecology;  Laterality: N/A;   BOWEL RESECTION  03/13/2015   Procedure: SMALL BOWEL RESECTION;  Surgeon: Everitt Amber, MD;  [facility];  Service: Gynecology;;   [place] and 1984   COLONOSCOPY WITH PROPOFOL N/A 07/19/2020   Procedure: COLONOSCOPY WITH PROPOFOL;  Surgeon: Doran Stabler, MD;  [facility];  Service: Gastroenterology;  Laterality: N/A;   COLOSTOMY  2008   COLOSTOMY TAKEDOWN     ESOPHAGOGASTRODUODENOSCOPY (EGD) WITH PROPOFOL N/A 12/21/2018   Procedure: ESOPHAGOGASTRODUODENOSCOPY (EGD) WITH PROPOFOL;  Surgeon: Doran Stabler, MD;  [facility];  Service: Gastroenterology;  Laterality: N/A;   HEMOSTASIS CLIP PLACEMENT  07/19/2020   Procedure: HEMOSTASIS CLIP PLACEMENT;  Surgeon: Doran Stabler, MD;  Location[facility];  Service: Gastroenterology;;   LAPAROTOMY N/A 03/13/2015   Procedure: EXPLORATORY LAPAROTOMY;  Surgeon: Everitt Amber, MD;  [facility];  Service: Gynecology;  Laterality: N/A;   POLYPECTOMY  07/19/2020   Procedure: POLYPECTOMY;  Surgeon: Doran Stabler, MD;  Location[facility];  Service: Gastroenterology;;   TONSILLECTOMY AND [place]  03/13/2015   Procedure: HERNIA REPAIR VENTRAL ADULT;  Surgeon: Everitt Amber, MD;  [facility];  Service: Gynecology;;  There were no vitals filed for this visit.   TREATMENT Pt seen for aquatic therapy today.  Treatment took place in water 3.25-4.8 ft in depth at the [employer] [facility] pool. Temp of water was 91.  Pt entered/exited the pool via stairs step to pattern independently with bilat rail.   Warm up: forward, backward and side stepping/walking cues for increased step length, increased speed, hand placement to increase resistance x 8 lengths sideways in pool. Stretching: gastroc,  hamstrings, LB on wall and sitting on bench of pool.     Sitting: knee flex/ext 2 x 10 reps with cues for increased speed Hip hinges 2 x 10 reps cuing for proper technique     Standing:  LE noodle kick downs 2 x 15 reps then x 15 reps using squoodle bilaterally challenge holding to barbell progressing to no ue support  cuing for tight core. Hip flex, adduction/abduction, circles and extension 2 x 15 reps with foam ankle cuff holding to wall Stretching hip flexors using noodle facing wall, advanced to knee pull through for strengthening x 15 reps.   Core:Strengthening and balance Planks: static using kick board 2 x 15 reps cuing for increased speed standing bilaterally on sqoodle. Cuing for core stabilization Core rotation challenging obliques, serratus and transverse abdominis pushing and pulling kick board.2 x 15 each direction     Pt requires buoyancy for support and to offload joints with strengthening exercises. Viscosity of the water is needed for resistance of strengthening; water current perturbations provides challenge to standing balance unsupported, requiring increased core activation                              PT Short Term Goals - 08/27/20 0955       PT SHORT TERM GOAL #1   Title Pt will be independent with initial HEP for improved carryover    Baseline initial HEP given    Time 3    Period Days    Status Achieved    Target Date 08/27/20      PT SHORT TERM GOAL #2   Title Pt will be compliant with walking program for improved endurance and mobility    Time 3    Period Weeks    Status Achieved    Target Date 08/27/20               PT Long Term Goals - 09/26/20 0929       PT LONG TERM GOAL #1   Title Pt will improve FOTO function score to no less than 61% in order to improve confidence and functional ability    Baseline 36% function; updated - 41%; update 09/26/20 - 53%    Time 8    Period Weeks    Status Partially Met       PT LONG TERM GOAL #2   Title Pt will improve reps in 30 Sec Sit to Stand to no less than 12 reps in order to improve functional mobility    Baseline 10 reps - update 09/26/20: 13 reps    Time 8    Period Weeks    Status Achieved      PT LONG TERM GOAL #3   Title Pt will increase distance in 6MWT to no less than 946f with stable O2 sats in order to improve endurance and community navigation    Baseline see flowsheet; update 09/26/20: 10220f   Time 8  Period Weeks    Status Achieved                    Patient will benefit from skilled therapeutic intervention in order to improve the following deficits and impairments:  Abnormal gait, Decreased activity tolerance, Decreased balance, Decreased endurance, Decreased mobility, Decreased strength, Difficulty walking  Visit Diagnosis: Unsteadiness on feet  Muscle weakness (generalized)  Other abnormalities of gait and mobility  Stiffness of left shoulder, not elsewhere classified  Chronic left shoulder pain  Chronic right shoulder pain  Clinical Assessment Pt with little to no pain reported. Focus Of treatment on core strength/ balance retraining and aerobic capacity challenges. She tolerates constant movement recovering from activity with high knee marching/walking. Pt progressed with balance challenges standing on sqoodle completing kick board pushdowns.   Problem List Patient Active Problem List   Diagnosis Date Noted   Acute respiratory failure with hypoxia ([place]) 07/19/2020   Chronic diastolic CHF (congestive heart failure) ([place]) 07/19/2020   Bronchospasm 07/19/2020   Elevated troponin 07/19/2020   Acute nasopharyngitis 05/18/2020   Incontinence 03/17/2020   Need for immunization against influenza 01/05/2020   Herpes simplex virus (HSV) infection of buttock 01/05/2020   OSA (obstructive sleep apnea) 07/25/2019   Prediabetes 07/25/2019   Pneumonia 02/19/2019   Lateral epicondylitis of left elbow 01/13/2019    Normocytic anemia 05/29/2018   Scleroderma ([place]) 04/22/2018   Cough with hemoptysis 04/22/2018   Swelling of foot joint, right 04/08/2018   Positive ANA (antinuclear antibody) 09/23/2016   Eczema 01/27/2016   History of pulmonary embolism 09/12/2015   Long term current use of anticoagulant therapy 09/06/2015   Chemotherapy-induced peripheral neuropathy ([place]) 06/13/2015   CKD (chronic kidney disease) stage 3, GFR 30-59 ml/min ([place]) 05/12/2015   GERD (gastroesophageal reflux disease) 05/12/2015   Ovarian carcinosarcoma, right ([place]) 03/29/2015   Endometrial ca ([place]) 03/29/2015   Ventral hernia without obstruction or gangrene    Morbid obesity ([place]) 02/19/2015   Essential hypertension, benign    Stress at home 12/14/2014   Left knee pain 08/04/2013   Seasonal allergies 07/29/2013    Macky Lower Sameena Artus MPT 10/17/2020, 2:15 PM  [facility] [address] [place], [place], [address] Phone: [phone]   Fax:  [phone]  Name: [name] MRN: [id number] Date of Birth: 1966-01-19   Addnded [facility] Tharon Aquas) Saydi Kobel MPT 10/17/20

## 2020-09-25 ENCOUNTER — Telehealth: Payer: Self-pay | Admitting: Physician Assistant

## 2020-09-25 MED ORDER — AMLODIPINE BESYLATE 5 MG PO TABS: 5.0000 mg | ORAL_TABLET | Freq: Every day | ORAL | 3 refills | Status: DC

## 2020-09-25 NOTE — Telephone Encounter (Signed)
Pt c/o medication issue:  1. Name of Medication: amLODipine (NORVASC) 5 MG tablet  2. How are you currently taking this medication (dosage and times per day)? Currently out   3. Are you having a reaction (difficulty breathing--STAT)? No  4. What is your medication issue? PT is calling to confirm if she needs to continue to take this medication.She states she has nomore refills and the pharmacy reached out to the office and didn't get a response.

## 2020-09-25 NOTE — Telephone Encounter (Signed)
Rx sent to pharmacy, patient aware.    Recent lab results, unable to reach patient. Discussed with patient, she picked up crestor and has started this medication.    Advised to disregard letter when she receives.

## 2020-09-26 ENCOUNTER — Other Ambulatory Visit: Payer: Self-pay

## 2020-09-26 ENCOUNTER — Ambulatory Visit: Payer: Medicare HMO

## 2020-09-26 DIAGNOSIS — R2681 Unsteadiness on feet: Secondary | ICD-10-CM

## 2020-09-26 DIAGNOSIS — R2689 Other abnormalities of gait and mobility: Secondary | ICD-10-CM

## 2020-09-26 DIAGNOSIS — M6281 Muscle weakness (generalized): Secondary | ICD-10-CM

## 2020-09-26 NOTE — Therapy (Signed)
[place], [place], [address] Phone: [phone]   Fax:  [phone]  Physical Therapy Treatment  Patient Details  Name: [name] MRN: [id number] Date of Birth: 1966-01-28 Referring Provider (PT): Martyn Malay, MD   Encounter Date: 09/26/2020   PT End of Session - 09/26/20 0917     Visit Number 15    Number of Visits 17    Date for PT Re-Evaluation 10/03/20    Authorization Type [employer] MCR - FOTO 6th and 10th    PT Start Time 0916    PT Stop Time 0954    PT Time Calculation (min) 38 min    Equipment Utilized During Treatment Other (comment)    Activity Tolerance Patient tolerated treatment well    Behavior During Therapy [facility] for tasks assessed/performed             Past Medical History:  Diagnosis Date   Allergy    Anemia    Anxiety    Arthritis    Blood transfusion without reported diagnosis    Cough    Cough 06/11/2018   Difficulty sleeping 06/11/2018   Diverticulitis with perforation 2010   DVT (deep vein thrombosis) in pregnancy 11/24/2016   DVT, lower extremity ([place])    Endometrial cancer ([place])    Enlarged lymph nodes 08/13/2017   Family history of adverse reaction to anesthesia    pts daughter had difficulty awakening following anesthesia, long time to wake up   GERD (gastroesophageal reflux disease)    Headache    Hemoptysis 04/22/2018   History of bronchitis    History of ear infections    Hospital discharge follow-up 03/02/2019   Hx of migraines    Hyperlipidemia    Hypertension    IBS (irritable bowel syndrome)    Kidney infection    Lupus (systemic lupus erythematosus) ([place]) 02/2017   Morbid obesity ([place])    Neck pain 01/13/2019   Ovarian cancer ([place]) dx'd 01/2015   PONV (postoperative nausea and vomiting)    Port-A-Cath in place    Right side   Portacath in place 09/12/2015   Pre-diabetes    Pyelonephritis    Right knee pain 11/28/2011   Scleroderma ([place])     Screen for colon cancer 03/02/2019   Slow rate of speech 08/22/2015   Chronic from CVA.  She also has loss of left nasolabial fold and slight left facial droop/small asymmetry on the left side secondary to CVA   UTI (urinary tract infection) 05/07/2018    Past Surgical History:  Procedure Laterality Date   ABDOMINAL HYSTERECTOMY N/A 03/13/2015   Procedure: TOTAL HYSTERECTOMY ABDOMINAL BILATERAL SALPINGO OOPHORECTOMY RADICAL TUMOR [place];  Surgeon: Everitt Amber, MD;  [facility];  Service: Gynecology;  Laterality: N/A;   BOWEL RESECTION  03/13/2015   Procedure: SMALL BOWEL RESECTION;  Surgeon: Everitt Amber, MD;  [facility]: [facility];  Service: Gynecology;;   [place] and 1984   COLONOSCOPY WITH PROPOFOL N/A 07/19/2020   Procedure: COLONOSCOPY WITH PROPOFOL;  Surgeon: Doran Stabler, MD;  [facility];  Service: Gastroenterology;  Laterality: N/A;   COLOSTOMY  2008   COLOSTOMY TAKEDOWN     ESOPHAGOGASTRODUODENOSCOPY (EGD) WITH PROPOFOL N/A 12/21/2018   Procedure: ESOPHAGOGASTRODUODENOSCOPY (EGD) WITH PROPOFOL;  Surgeon: Doran Stabler, MD;  [facility];  Service: Gastroenterology;  Laterality: N/A;   HEMOSTASIS CLIP PLACEMENT  07/19/2020   Procedure: HEMOSTASIS CLIP PLACEMENT;  Surgeon: Wilfrid Lund  L III, MD;  [facility];  Service: Gastroenterology;;   LAPAROTOMY N/A 03/13/2015   Procedure: EXPLORATORY LAPAROTOMY;  Surgeon: Everitt Amber, MD;  [facility];  Service: Gynecology;  Laterality: N/A;   POLYPECTOMY  07/19/2020   Procedure: POLYPECTOMY;  Surgeon: Doran Stabler, MD;  Location: [facility];  Service: Gastroenterology;;   TONSILLECTOMY AND [place]  03/13/2015   Procedure: HERNIA REPAIR VENTRAL ADULT;  Surgeon: Everitt Amber, MD;  [facility];  Service: Gynecology;;    There were no vitals filed for this visit.   Subjective Assessment - 09/26/20 0918     Subjective Pt presents to PT with  no current reports of pain, stating she is feeling pretty good. She does note some continuing R knee pain with activity, but is still enjoying aquatic therapy and will continue to work at [facility] after discharge from skilled PT. Pt is ready to begin PT at this time.    Currently in Pain? No/denies    Pain Score 0-No pain                [facility] PT Assessment - 09/26/20 0001       Observation/Other Assessments   Focus on Therapeutic Outcomes (FOTO)  53% function      6 minute walk test results    Aerobic Endurance Distance Walked 1028    Endurance additional comments one quick standing rest break                           [facility] Adult PT Treatment/Exercise - 09/26/20 0001       Self-Care   Self-Care Other Self-Care Comments    Other Self-Care Comments  review of HEP and discussion of importance of continued work with [facility] aquatic center after discharge from [facility] Other Therapeutic Activities    Other Therapeutic Activities assessment of tests/measures and outcomes with 6MWT, FOTO, and 30 sec STS      Knee/Hip Exercises: Standing   Hip Abduction 2 sets;10 reps;Both    Abduction Limitations green tband    Hip Extension 2 sets;10 reps;Both    Extension Limitations green tband      Knee/Hip Exercises: Seated   Sit to Sand 10 reps;without UE support      Knee/Hip Exercises: Supine   Straight Leg Raises 10 reps;Both    Straight Leg Raises Limitations R slightly more difficult d/t R knee pain                      PT Short Term Goals - 08/27/20 0955       PT SHORT TERM GOAL #1   Title Pt will be independent with initial HEP for improved carryover    Baseline initial HEP given    Time 3    Period Days    Status Achieved    Target Date 08/27/20      PT SHORT TERM GOAL #2   Title Pt will be compliant with walking program for improved endurance and mobility    Time 3     Period Weeks    Status Achieved    Target Date 08/27/20               PT Long Term Goals - 09/26/20 0929       PT LONG TERM GOAL #1  Title Pt will improve FOTO function score to no less than 61% in order to improve confidence and functional ability    Baseline 36% function; updated - 41%; update 09/26/20 - 53%    Time 8    Period Weeks    Status Partially Met      PT LONG TERM GOAL #2   Title Pt will improve reps in 30 Sec Sit to Stand to no less than 12 reps in order to improve functional mobility    Baseline 10 reps - update 09/26/20: 13 reps    Time 8    Period Weeks    Status Achieved      PT LONG TERM GOAL #3   Title Pt will increase distance in 6MWT to no less than 985f with stable O2 sats in order to improve endurance and community navigation    Baseline see flowsheet; update 09/26/20: 1025f   Time 8    Period Weeks    Status Achieved                   Plan - 09/26/20 0957     Clinical Impression Statement Pt has progressed well with therapy, showing improving endurance with increased distance in 6MWT as well as increased reps in 30 Sec chair rise, meeting her LTGs for each outcome measure. While she did not fully progress to LTG for her FOTO score, she made continued increases in functional score throughout therapy, with a 17 point increase from initial evaluation score today. She demonstrated knowledge of her HEP and should continue to improve with compliance. Pt has one more aquatic therapy session to review HEP but then should be appropriate for discharge with continued activity at home and the aquatic center.    PT Treatment/Interventions ADLs/Self Care Home Management;Aquatic Therapy;Electrical Stimulation;Moist Heat;Cryotherapy;Ultrasound;Gait trScientist, forensicherapeutic activities;Therapeutic exercise;Functional mobility training;Balance training;Neuromuscular re-education;Patient/family education;Manual techniques    PT Home Exercise Plan  Access Code: [email or web address]  Consulted and Agree with Plan of Care Patient             Patient will benefit from skilled therapeutic intervention in order to improve the following deficits and impairments:  Abnormal gait, Decreased activity tolerance, Decreased balance, Decreased endurance, Decreased mobility, Decreased strength, Difficulty walking  Visit Diagnosis: Unsteadiness on feet  Muscle weakness (generalized)  Other abnormalities of gait and mobility     Problem List Patient Active Problem List   Diagnosis Date Noted   Acute respiratory failure with hypoxia (HCC) 07/19/2020   Chronic diastolic CHF (congestive heart failure) (HCBelleville04/14/2022   Bronchospasm 07/19/2020   Elevated troponin 07/19/2020   Acute nasopharyngitis 05/18/2020   Incontinence 03/17/2020   Need for immunization against influenza 01/05/2020   Herpes simplex virus (HSV) infection of buttock 01/05/2020   OSA (obstructive sleep apnea) 07/25/2019   Prediabetes 07/25/2019   Pneumonia 02/19/2019   Lateral epicondylitis of left elbow 01/13/2019   Normocytic anemia 05/29/2018   Scleroderma (HCElmwood01/16/2020   Cough with hemoptysis 04/22/2018   Swelling of foot joint, right 04/08/2018   Positive ANA (antinuclear antibody) 09/23/2016   Eczema 01/27/2016   History of pulmonary embolism 09/12/2015   Long term current use of anticoagulant therapy 09/06/2015   Chemotherapy-induced peripheral neuropathy (HCBe[place] Park03/11/2015   CKD (chronic kidney disease) stage 3, GFR 30-59 ml/min (HCKa[place]02/07/2015   GERD (gastroesophageal reflux disease) 05/12/2015   Ovarian carcinosarcoma, right (HCLewis12/22/2016   Endometrial ca (HCGulf Port12/22/2016   Ventral hernia without obstruction or gangrene  Morbid obesity ([place]) 02/19/2015   Essential hypertension, benign    Stress at home 12/14/2014   Left knee pain 08/04/2013   Seasonal allergies 07/29/2013    Ward Chatters, PT, DPT 09/26/20 10:08 AM  [facility] [address] [place], [place], [address] Phone: [phone]   Fax:  [phone]  Name: [name] MRN: [id number] Date of Birth: 1966-03-10

## 2020-10-01 ENCOUNTER — Encounter (HOSPITAL_BASED_OUTPATIENT_CLINIC_OR_DEPARTMENT_OTHER): Payer: Self-pay | Admitting: Physical Therapy

## 2020-10-01 ENCOUNTER — Ambulatory Visit (HOSPITAL_BASED_OUTPATIENT_CLINIC_OR_DEPARTMENT_OTHER): Payer: Medicare HMO | Admitting: Physical Therapy

## 2020-10-01 ENCOUNTER — Other Ambulatory Visit: Payer: Self-pay

## 2020-10-01 DIAGNOSIS — R293 Abnormal posture: Secondary | ICD-10-CM

## 2020-10-01 DIAGNOSIS — R2689 Other abnormalities of gait and mobility: Secondary | ICD-10-CM

## 2020-10-01 DIAGNOSIS — M25612 Stiffness of left shoulder, not elsewhere classified: Secondary | ICD-10-CM

## 2020-10-01 DIAGNOSIS — R252 Cramp and spasm: Secondary | ICD-10-CM

## 2020-10-01 DIAGNOSIS — J189 Pneumonia, unspecified organism: Secondary | ICD-10-CM | POA: Diagnosis not present

## 2020-10-01 DIAGNOSIS — M6281 Muscle weakness (generalized): Secondary | ICD-10-CM

## 2020-10-01 DIAGNOSIS — G8929 Other chronic pain: Secondary | ICD-10-CM

## 2020-10-01 DIAGNOSIS — R2681 Unsteadiness on feet: Secondary | ICD-10-CM

## 2020-10-01 NOTE — Therapy (Signed)
[facility] [address] [place], [place], [address] Phone: [phone]   Fax:  [phone]  Physical Therapy Treatment  Patient Details  Name: [name] MRN: [id number] Date of Birth: [birth year] Referring Provider (PT): Martyn Malay, MD   Encounter Date: 10/01/2020   PT End of Session - 10/01/20 1218     Number of Visits 17    Date for PT Re-Evaluation 10/03/20             Past Medical History:  Diagnosis Date   Allergy    Anemia    Anxiety    Arthritis    Blood transfusion without reported diagnosis    Cough    Cough 06/11/2018   Difficulty sleeping 06/11/2018   Diverticulitis with perforation 2010   DVT (deep vein thrombosis) in pregnancy 11/24/2016   DVT, lower extremity ([place])    Endometrial cancer ([place])    Enlarged lymph nodes 08/13/2017   Family history of adverse reaction to anesthesia    pts daughter had difficulty awakening following anesthesia, long time to wake up   GERD (gastroesophageal reflux disease)    Headache    Hemoptysis 04/22/2018   History of bronchitis    History of ear infections    Hospital discharge follow-up 03/02/2019   Hx of migraines    Hyperlipidemia    Hypertension    IBS (irritable bowel syndrome)    Kidney infection    Lupus (systemic lupus erythematosus) ([place]) 02/2017   Morbid obesity ([place])    Neck pain 01/13/2019   Ovarian cancer ([place]) dx'd 01/2015   PONV (postoperative nausea and vomiting)    Port-A-Cath in place    Right side   Portacath in place 09/12/2015   Pre-diabetes    Pyelonephritis    Right knee pain 11/28/2011   Scleroderma ([place])    Screen for colon cancer 03/02/2019   Slow rate of speech 08/22/2015   Chronic from CVA.  She also has loss of left nasolabial fold and slight left facial droop/small asymmetry on the left side secondary to CVA   UTI (urinary tract infection) 05/07/2018    Past Surgical History:  Procedure Laterality Date   ABDOMINAL  HYSTERECTOMY N/A 03/13/2015   Procedure: TOTAL HYSTERECTOMY ABDOMINAL BILATERAL SALPINGO OOPHORECTOMY RADICAL TUMOR [place];  Surgeon: Everitt Amber, MD;  [facility];  Service: Gynecology;  Laterality: N/A;   BOWEL RESECTION  03/13/2015   Procedure: SMALL BOWEL RESECTION;  Surgeon: Everitt Amber, MD;  [facility];  Service: Gynecology;;   [place] and 1984   COLONOSCOPY WITH PROPOFOL N/A 07/19/2020   Procedure: COLONOSCOPY WITH PROPOFOL;  Surgeon: Doran Stabler, MD;  [facility];  Service: Gastroenterology;  Laterality: N/A;   COLOSTOMY  2008   COLOSTOMY TAKEDOWN     ESOPHAGOGASTRODUODENOSCOPY (EGD) WITH PROPOFOL N/A 12/21/2018   Procedure: ESOPHAGOGASTRODUODENOSCOPY (EGD) WITH PROPOFOL;  Surgeon: Doran Stabler, MD;  [facility];  Service: Gastroenterology;  Laterality: N/A;   HEMOSTASIS CLIP PLACEMENT  07/19/2020   Procedure: HEMOSTASIS CLIP PLACEMENT;  Surgeon: Doran Stabler, MD;  [facility];  Service: Gastroenterology;;   LAPAROTOMY N/A 03/13/2015   Procedure: EXPLORATORY LAPAROTOMY;  Surgeon: Everitt Amber, MD;  [facility];  Service: Gynecology;  Laterality: N/A;   POLYPECTOMY  07/19/2020   Procedure: POLYPECTOMY;  Surgeon: Doran Stabler, MD;  [facility];  Service: Gastroenterology;;   TONSILLECTOMY AND [place]  REPAIR  03/13/2015   Procedure: HERNIA REPAIR VENTRAL ADULT;  Surgeon: Everitt Amber, MD;  [facility];  Service: Gynecology;;    There were no vitals filed for this visit.   Subjective Assessment - 10/01/20 1217     Subjective Last visit.  Pt reports contiuing to feel good, no concerns.  She VU of importance and benefits of continuing with HEP.    Pertinent History Numerous and complex PMH - recent hospitalization 07/19/20 - 07/21/20 for pneumonia    Limitations Walking;Standing    How long can you sit comfortably? indefinite    How long can you stand comfortably?  45-60 minutes    How long can you walk comfortably? 5 minutes    Patient Stated Goals pt would like to improve activty tolerance in order to get back to start with aquatic therapy program again               TREATMENT Pt seen for aquatic therapy today.  Treatment took place in water 3.25-4.8 ft in depth at the [facility] pool. Temp of water was 91.  Pt entered/exited the pool via stairs step to pattern independently with bilat rail.   Warm up: forward, backward and side stepping/walking cues for increased step length, increased speed, hand placement to increase resistance x 8 lengths sideways in pool. Stretching: gastroc, hamstrings, LB on wall and sitting on bench of pool.     Standing:  LE squoodle kick downs, knee and hip extension then 2 sets externally rotated 2 x 15 reps bilaterally no ue support cuing for tight core. Hip flex, adduction/abduction, circles and extension 2 x 15 reps with foam ankle cuff holding to wall Stretching hip flexors using noodle facing wall, advanced to knee pull through for strengthening x 15 reps. Pt instructed on gaining positions independently/noodle placement to complete HEP at her new pool.    Core:Strengthening and balance Planks: static using kick board 2 x 15 reps cuing for increased speed standing bilaterally on sqoodle. Cuing for core stabilization Core rotation challenging obliques, serratus and transverse abdominis pushing and pulling kick board.2 x 15 each direction  Pt requires buoyancy for support and to offload joints with strengthening exercises. Viscosity of the water is needed for resistance of strengthening; water current perturbations provides challenge to standing balance unsupported, requiring increased core activation.                         PT Education - 10/01/20 1346     Education Details Added to HEP.  Pt to pick up laminated copy from front desk.    Person(s) Educated Patient    Methods  Explanation    Comprehension Verbalized understanding              PT Short Term Goals - 08/27/20 0955       PT SHORT TERM GOAL #1   Title Pt will be independent with initial HEP for improved carryover    Baseline initial HEP given    Time 3    Period Days    Status Achieved    Target Date 08/27/20      PT SHORT TERM GOAL #2   Title Pt will be compliant with walking program for improved endurance and mobility    Time 3    Period Weeks    Status Achieved    Target Date 08/27/20               PT Long  Term Goals - 09/26/20 0929       PT LONG TERM GOAL #1   Title Pt will improve FOTO function score to no less than 61% in order to improve confidence and functional ability    Baseline 36% function; updated - 41%; update 09/26/20 - 53%    Time 8    Period Weeks    Status Partially Met      PT LONG TERM GOAL #2   Title Pt will improve reps in 30 Sec Sit to Stand to no less than 12 reps in order to improve functional mobility    Baseline 10 reps - update 09/26/20: 13 reps    Time 8    Period Weeks    Status Achieved      PT LONG TERM GOAL #3   Title Pt will increase distance in 6MWT to no less than 919f with stable O2 sats in order to improve endurance and community navigation    Baseline see flowsheet; update 09/26/20: 10291f   Time 8    Period Weeks    Status Achieved                   Plan - 10/01/20 1053     Clinical Impression Statement Last session.  Concentration on pt completing and understanding HEP.  [employer] updated.  Laminated for pt to pick up from front desk.    Personal Factors and Comorbidities Comorbidity 3+;Fitness    Comorbidities SLE; HTN; Cancer    Examination-Activity Limitations Stand;Squat;Stairs;Locomotion Level;Carry;Lift;Transfers    Examination-Participation Restrictions Yard Work;Shop;Community Activity    Stability/Clinical Decision Making Stable/Uncomplicated    Clinical Decision Making Moderate    Rehab Potential  Good    PT Frequency 2x / week    PT Treatment/Interventions ADLs/Self Care Home Management;Aquatic Therapy;Electrical Stimulation;Moist Heat;Cryotherapy;Ultrasound;Gait trScientist, forensicherapeutic activities;Therapeutic exercise;Functional mobility training;Balance training;Neuromuscular re-education;Patient/family education;Manual techniques    PT Next Visit Plan DC today    PT Home Exercise Plan Access Code: [email or web address]  Consulted and Agree with Plan of Care Patient             Patient will benefit from skilled therapeutic intervention in order to improve the following deficits and impairments:  Abnormal gait, Decreased activity tolerance, Decreased balance, Decreased endurance, Decreased mobility, Decreased strength, Difficulty walking  Visit Diagnosis: Unsteadiness on feet  Muscle weakness (generalized)  Other abnormalities of gait and mobility  Stiffness of left shoulder, not elsewhere classified  Abnormal posture  Chronic right shoulder pain  Chronic left shoulder pain  Cramp and spasm     Problem List Patient Active Problem List   Diagnosis Date Noted   Acute respiratory failure with hypoxia (HCBurleigh04/14/2022   Chronic diastolic CHF (congestive heart failure) (HCKodiak04/14/2022   Bronchospasm 07/19/2020   Elevated troponin 07/19/2020   Acute nasopharyngitis 05/18/2020   Incontinence 03/17/2020   Need for immunization against influenza 01/05/2020   Herpes simplex virus (HSV) infection of buttock 01/05/2020   OSA (obstructive sleep apnea) 07/25/2019   Prediabetes 07/25/2019   Pneumonia 02/19/2019   Lateral epicondylitis of left elbow 01/13/2019   Normocytic anemia 05/29/2018   Scleroderma (HCPasquotank01/16/2020   Cough with hemoptysis 04/22/2018   Swelling of foot joint, right 04/08/2018   Positive ANA (antinuclear antibody) 09/23/2016   Eczema 01/27/2016   History of pulmonary embolism 09/12/2015   Long term current use of anticoagulant therapy 09/06/2015    Chemotherapy-induced peripheral neuropathy (HCJeffersonville03/11/2015   CKD (chronic kidney disease) stage 3, GFR  30-59 ml/min ([place]) 05/12/2015   GERD (gastroesophageal reflux disease) 05/12/2015   Ovarian carcinosarcoma, right ([place]) 03/29/2015   Endometrial ca ([place]) 03/29/2015   Ventral hernia without obstruction or gangrene    Morbid obesity ([place]) 02/19/2015   Essential hypertension, benign    Stress at home 12/14/2014   Left knee pain 08/04/2013   Seasonal allergies 07/29/2013    Vedia Pereyra  MPT 10/01/2020, 1:51 PM  [facility] [place], [place], [address] Phone: [phone]   Fax:  [phone]  Name: [name] MRN: [id number] Date of Birth: 04/18/1965

## 2020-10-22 ENCOUNTER — Other Ambulatory Visit: Payer: Self-pay

## 2020-10-23 NOTE — Telephone Encounter (Signed)
Patient calls nurse checking the status of refill. Patient reports she has been out for ~1 week, however I do not see where the pharmacy contacted [place]. Please advise.

## 2020-10-25 ENCOUNTER — Other Ambulatory Visit: Payer: Self-pay | Admitting: Family Medicine

## 2020-10-25 MED ORDER — TOVIAZ 8 MG PO TB24: ORAL_TABLET | ORAL | 2 refills | Status: DC

## 2020-10-26 ENCOUNTER — Other Ambulatory Visit: Payer: Self-pay | Admitting: Family Medicine

## 2020-11-14 ENCOUNTER — Other Ambulatory Visit: Payer: Self-pay | Admitting: Nurse Practitioner

## 2020-11-22 ENCOUNTER — Telehealth: Payer: Self-pay | Admitting: Nurse Practitioner

## 2020-11-22 NOTE — Telephone Encounter (Signed)
Please advise 

## 2020-11-22 NOTE — Telephone Encounter (Signed)
[name], please contact the patient and obtain further sx update, specifically regarding her LUQ pain.   Discontinue Dicyclomine.   She can try IbGard (peppermint oil) otc one capsule bid for abdominal pain   Pls send her to the lab for a CBC, CMP and lipase level.  Schedule her for an abd/pelvic CT scan with oral and IV with focus on the pancreas. Labs to be done prior to CT.   She is on Victoza which can cause pancreatitis  Thank you

## 2020-11-22 NOTE — Telephone Encounter (Signed)
Inbound call from patient. States pharmacy and insurance will not fill dicyclomine because it interferes with Toviaz. Asks what she should do?

## 2020-11-23 ENCOUNTER — Other Ambulatory Visit: Payer: Self-pay

## 2020-11-23 DIAGNOSIS — R1013 Epigastric pain: Secondary | ICD-10-CM

## 2020-11-23 NOTE — Telephone Encounter (Signed)
Patient contacted and instructed. She agrees to this plan.Orders for the labs entered. CT order sent to [place].

## 2020-11-24 ENCOUNTER — Other Ambulatory Visit: Payer: Self-pay | Admitting: Family Medicine

## 2020-11-26 ENCOUNTER — Other Ambulatory Visit (INDEPENDENT_AMBULATORY_CARE_PROVIDER_SITE_OTHER): Payer: Medicare HMO

## 2020-11-26 DIAGNOSIS — R1013 Epigastric pain: Secondary | ICD-10-CM | POA: Diagnosis not present

## 2020-11-26 LAB — COMPREHENSIVE METABOLIC PANEL
ALT: 10 U/L (ref 0–35)
AST: 15 U/L (ref 0–37)
Albumin: 3.9 g/dL (ref 3.5–5.2)
Alkaline Phosphatase: 83 U/L (ref 39–117)
BUN: 23 mg/dL (ref 6–23)
CO2: 27 mEq/L (ref 19–32)
Calcium: 9.3 mg/dL (ref 8.4–10.5)
Chloride: 105 mEq/L (ref 96–112)
Creatinine, Ser: 1.21 mg/dL — ABNORMAL HIGH (ref 0.40–1.20)
GFR: 50.28 mL/min — ABNORMAL LOW (ref >60.00)
Glucose, Bld: 74 mg/dL (ref 70–99)
Potassium: 4.2 mEq/L (ref 3.5–5.1)
Sodium: 140 mEq/L (ref 135–145)
Total Bilirubin: 0.4 mg/dL (ref 0.2–1.2)
Total Protein: 6.9 g/dL (ref 6.0–8.3)

## 2020-11-26 LAB — CBC WITH DIFFERENTIAL/PLATELET
Basophils Absolute: 0.1 10*3/uL (ref 0.0–0.1)
Basophils Relative: 0.8 % (ref 0.0–3.0)
Eosinophils Absolute: 0.3 10*3/uL (ref 0.0–0.7)
Eosinophils Relative: 4.1 % (ref 0.0–5.0)
HCT: 35.9 % — ABNORMAL LOW (ref 36.0–46.0)
Hemoglobin: 11.6 g/dL — ABNORMAL LOW (ref 12.0–15.0)
Lymphocytes Relative: 26.3 % (ref 12.0–46.0)
Lymphs Abs: 1.8 10*3/uL (ref 0.7–4.0)
MCHC: 32.3 g/dL (ref 30.0–36.0)
MCV: 88.2 fl (ref 78.0–100.0)
Monocytes Absolute: 0.6 10*3/uL (ref 0.1–1.0)
Monocytes Relative: 8.5 % (ref 3.0–12.0)
Neutro Abs: 4.2 10*3/uL (ref 1.4–7.7)
Neutrophils Relative %: 60.3 % (ref 43.0–77.0)
Platelets: 196 10*3/uL (ref 150.0–400.0)
RBC: 4.07 Mil/uL (ref 3.87–5.11)
RDW: 15 % (ref 11.5–15.5)
WBC: 7 10*3/uL (ref 4.0–10.5)

## 2020-11-26 LAB — LIPASE: Lipase: 25 U/L (ref 11.0–59.0)

## 2020-11-26 NOTE — Telephone Encounter (Signed)
Please also set her up for an office appointment with me or [facility] since she still reports problems and was last seen in early May  -HD

## 2020-11-26 NOTE — Telephone Encounter (Signed)
Appointment made.  .[name], CMA  

## 2020-11-26 NOTE — Telephone Encounter (Signed)
Please let patient know I am refilling this medication, but she needs to schedule an appointment with me.   Thanks, Manjit Bufano J Marita Burnsed, MD  

## 2020-11-27 ENCOUNTER — Telehealth: Payer: Self-pay | Admitting: Internal Medicine

## 2020-11-27 NOTE — Telephone Encounter (Signed)
Follow up scheduled for 12/19/20 with Carl Best, NP.

## 2020-11-27 NOTE — Telephone Encounter (Signed)
Returned call to patient, made patient aware of the following:  Elouise Munroe, MD  You 38 minutes ago (10:37 AM)   Please call patient and see if she would like to be seen sooner or if she would like to discuss at our appointment in October. Ok to hold crestor until then and see how she feels.  GA     Patient states she would like to wait until her October appointment to be seen. Patient will remain off of her Crestor to see if symptoms improve. Advised patient to call back with any concerns or if she would like a sooner appointment. Patient verbalized understanding.

## 2020-11-27 NOTE — Telephone Encounter (Signed)
Pt c/o medication issue:  1. Name of Medication: Rosuvastatin  2. How are you currently taking this medication (dosage and times per day)? 1 time a day  3. Are you having a reaction (difficulty breathing--STAT)? no  4. What is your medication issue?  No energy, feels like she is getting

## 2020-11-27 NOTE — Telephone Encounter (Signed)
Spoke with the patient who states that she has been feeling groggy with low energy. She states that she is not sure if it is her crestor causing her symptoms. She previously had issues on crestor and her dose was reduced.  She also reports that she has been having some GI issues and is going through a work up. She states that she is not sure if her symptoms are related to what is going on with her stomach or not. She has lab work and CT scan pending.  She would like to hold crestor until she figures out what is going on with her stomach.

## 2020-11-29 ENCOUNTER — Other Ambulatory Visit: Payer: Self-pay | Admitting: *Deleted

## 2020-11-29 DIAGNOSIS — R1013 Epigastric pain: Secondary | ICD-10-CM

## 2020-12-04 ENCOUNTER — Other Ambulatory Visit: Payer: Self-pay | Admitting: Family Medicine

## 2020-12-07 ENCOUNTER — Telehealth: Payer: Self-pay

## 2020-12-07 MED ORDER — RIVAROXABAN 20 MG PO TABS: ORAL_TABLET | ORAL | 0 refills | Status: DC

## 2020-12-07 NOTE — Telephone Encounter (Signed)
Patient calls nurse line 8/22 Xarelto prescription was sent to the wrong pharmacy. Patient reports she now uses [facility] on [address].   Will resend to [facility] on [address].

## 2020-12-11 ENCOUNTER — Other Ambulatory Visit (INDEPENDENT_AMBULATORY_CARE_PROVIDER_SITE_OTHER): Payer: Medicare HMO

## 2020-12-11 DIAGNOSIS — R1013 Epigastric pain: Secondary | ICD-10-CM

## 2020-12-11 LAB — BASIC METABOLIC PANEL
BUN: 25 mg/dL — ABNORMAL HIGH (ref 6–23)
CO2: 29 mEq/L (ref 19–32)
Calcium: 9.3 mg/dL (ref 8.4–10.5)
Chloride: 103 mEq/L (ref 96–112)
Creatinine, Ser: 1.17 mg/dL (ref 0.40–1.20)
GFR: 52.34 mL/min — ABNORMAL LOW (ref >60.00)
Glucose, Bld: 95 mg/dL (ref 70–99)
Potassium: 4.7 mEq/L (ref 3.5–5.1)
Sodium: 139 mEq/L (ref 135–145)

## 2020-12-14 ENCOUNTER — Ambulatory Visit
Admission: RE | Admit: 2020-12-14 | Discharge: 2020-12-14 | Disposition: A | Discharge status: Home / Self Care | Payer: Medicare HMO | Source: Ambulatory Visit | Attending: Nurse Practitioner | Admitting: Nurse Practitioner

## 2020-12-14 ENCOUNTER — Other Ambulatory Visit: Payer: Self-pay

## 2020-12-14 DIAGNOSIS — R1013 Epigastric pain: Secondary | ICD-10-CM

## 2020-12-14 MED ORDER — IOPAMIDOL (ISOVUE-300) INJECTION 61%
100.0000 mL | Freq: Once | INTRAVENOUS | Status: AC | PRN
  Administered 2020-12-14: 100 mL via INTRAVENOUS

## 2020-12-19 ENCOUNTER — Encounter: Payer: Self-pay | Admitting: Nurse Practitioner

## 2020-12-19 ENCOUNTER — Other Ambulatory Visit (INDEPENDENT_AMBULATORY_CARE_PROVIDER_SITE_OTHER): Payer: Medicare HMO

## 2020-12-19 ENCOUNTER — Ambulatory Visit (INDEPENDENT_AMBULATORY_CARE_PROVIDER_SITE_OTHER): Payer: Medicare HMO | Admitting: Nurse Practitioner

## 2020-12-19 VITALS — BP 110/60 | HR 75 | Ht 65.0 in | Wt 366.0 lb

## 2020-12-19 DIAGNOSIS — K449 Diaphragmatic hernia without obstruction or gangrene: Secondary | ICD-10-CM

## 2020-12-19 DIAGNOSIS — R112 Nausea with vomiting, unspecified: Secondary | ICD-10-CM | POA: Diagnosis not present

## 2020-12-19 DIAGNOSIS — D649 Anemia, unspecified: Secondary | ICD-10-CM

## 2020-12-19 LAB — IBC + FERRITIN
Ferritin: 21.3 ng/mL (ref 10.0–291.0)
Iron: 71 ug/dL (ref 42–145)
Saturation Ratios: 16.7 % — ABNORMAL LOW (ref 20.0–50.0)
TIBC: 424.2 ug/dL (ref 250.0–450.0)
Transferrin: 303 mg/dL (ref 212.0–360.0)

## 2020-12-19 LAB — CBC
HCT: 38.3 % (ref 36.0–46.0)
Hemoglobin: 12.3 g/dL (ref 12.0–15.0)
MCHC: 32.2 g/dL (ref 30.0–36.0)
MCV: 88.4 fl (ref 78.0–100.0)
Platelets: 234 10*3/uL (ref 150.0–400.0)
RBC: 4.33 Mil/uL (ref 3.87–5.11)
RDW: 15 % (ref 11.5–15.5)
WBC: 8.5 10*3/uL (ref 4.0–10.5)

## 2020-12-19 MED ORDER — OMEPRAZOLE 40 MG PO CPDR: 40.0000 mg | DELAYED_RELEASE_CAPSULE | Freq: Two times a day (BID) | ORAL | 0 refills | Status: DC

## 2020-12-19 NOTE — Progress Notes (Signed)
12/19/2020 [name] [id number] Mar 28, 1966   Chief Complaint: vomiting   History of Present Illness: [name] is a 55 year old female with a past medical history of obesity, anxiety, arthritis, hypertension, chronic diastolic CHF LV EF 60 - [id number] with grade I diastolic dysfunction, CVA, DVT/PE on Xarelto, DM II, CKD, endometrial cancer s/p total abdominal hysterectomy, BSO, cryo reduction and chemotherapy in 2017, diverticulitis with perforation which required a temporary colostomy 2008, Lupus, hiatal hernia, GERD and IBS. I saw the patient in office 08/08/2020 for further evaluation regarding LUQ pain. Omeprazole was increased to '40mg'$  po bid, she was prescribed Dicyclomine PRN. Her LUQ pain worsened with intermittent vomiting. An abd/pelvic CT scan 12/14/2020 showed a moderate hiatal hernia, with intrathoracic position of the gastric fundus. She presents today for further follow up. She vomits partially digested food and sometimes acid secretions once most days.  Often vomits around 10:30pm after she lays down to go to bed. No hematemesis or coffee ground emesis. She also spits up phlegm or acid secretions throughout the night. She keeps an empty bottle at the bedside and spits into the bottle throughout the night, sometimes spits up about 16 ounces in one night. She feels like food just sits in her stomach. No dysphagia, food does not get stuck in the esophagus. She has intermittent LUQ abdominal cramping pain. Meat and high fiber foods do not digest well. She reported intentionally losing 30lbs  over the past year by consuming Premier and [employer] Life protein drinks with a low carb diet followed by the bariatric clinic with [facility]. She wishes to undergo bariatric surgery in the near future. She remains on Victoza to aid with weight loss.   EGD 12/21/2018: showed a 5cm hiatal hernia otherwise was normal.   Colonoscopy by Dr. Loletha Carrow on 07/19/2020: identified a 53m benign polyp at the  splenic flexure and a  680mtubular adenomatous polyp in the mid rectum which were removed. A repeat a colonoscopy in 3 year was recommended due to a fair bowel prep   CMP Latest Ref Rng & Units 12/11/2020 11/26/2020 08/28/2020  Glucose 70 - 99 mg/dL 95 74 77  BUN 6 - 23 mg/dL 25(H) 23 15  Creatinine 0.40 - 1.20 mg/dL 1.17 1.21(H) 0.93  Sodium 135 - 145 mEq/L 139 140 142  Potassium 3.5 - 5.1 mEq/L 4.7 4.2 4.1  Chloride 96 - 112 mEq/L 103 105 105  CO2 19 - 32 mEq/L '29 27 23  '$ Calcium 8.4 - 10.5 mg/dL 9.3 9.3 9.2  Total Protein 6.0 - 8.3 g/dL - 6.9 6.2  Total Bilirubin 0.2 - 1.2 mg/dL - 0.4 0.3  Alkaline Phos 39 - 117 U/L - 83 99  AST 0 - 37 U/L - 15 17  ALT 0 - 35 U/L - 10 8  Lipase 25 on 11/26/2020  CTAP 12/14/2020: 1. No acute CT findings of the abdomen or pelvis to explain pain, nausea, or vomiting. Specifically, no abnormality of the pancreas per ordering indication. 2. Moderate hiatal hernia, with intrathoracic position of the gastric fundus, possibly symptomatic. 3. Status post hysterectomy. 4. Subcentimeter nodularity of the omentum in the ventral abdomen is unchanged, consistent with small, normal lymph nodes given stability. No evidence of recurrent or metastatic disease in the abdomen or pelvis per history of ovarian cancer.  Current Outpatient Medications on File Prior to Visit  Medication Sig Dispense Refill   acetaminophen (TYLENOL) 500 MG tablet Take 1,000 mg by mouth every 6 (six) hours  as needed for moderate pain.     albuterol (VENTOLIN HFA) 108 (90 Base) MCG/ACT inhaler Inhale 2 puffs into the lungs every 6 (six) hours as needed for wheezing or shortness of breath. Please instruct patient in usage. 18 g 3   amLODipine (NORVASC) 5 MG tablet Take 1 tablet (5 mg total) by mouth daily. 90 tablet 3   B-D UF III MINI PEN NEEDLES 31G X 5 MM MISC      Cholecalciferol (VITAMIN D) 50 MCG (2000 UT) CAPS Take 8,000 Units by mouth daily.     diclofenac Sodium (VOLTAREN) 1 % GEL Apply  to knee once daily as needed (Patient taking differently: Apply 1 application topically daily as needed (knee pain). Apply to knee once daily as needed) 100 g 1   dicyclomine (BENTYL) 10 MG capsule TAKE 1 CAPSULE(10 MG) BY MOUTH TWICE DAILY BEFORE A MEAL 60 capsule 0   fesoterodine (TOVIAZ) 8 MG TB24 tablet TAKE 1 TABLET(8 MG) BY MOUTH DAILY 90 tablet 2   furosemide (LASIX) 20 MG tablet Take 20 mg by mouth daily as needed for edema.     gabapentin (NEURONTIN) 100 MG capsule Take 1 capsule (100 mg total) by mouth daily as needed (neuropathy). 30 capsule 2   Insulin Pen Needle 31G X 5 MM MISC Use as directed with Victoza     lidocaine-prilocaine (EMLA) cream Apply 1 application topically as needed (port access).     liraglutide (VICTOZA) 18 MG/3ML SOPN Inject 1.8 mg into the skin daily.     rivaroxaban (XARELTO) 20 MG TABS tablet TAKE 1 TABLET(20 MG) BY MOUTH EVERY MORNING 90 tablet 0   triamcinolone (KENALOG) 0.1 % APPLY TO THE AFFECTED AREA TWICE DAILY AS NEEDED FOR SKIN IRRITATION (Patient taking differently: Apply 1 application topically 2 (two) times daily as needed (irritation).) 45 g 0   valACYclovir (VALTREX) 500 MG tablet TAKE 1 TABLET(500 MG) BY MOUTH TWICE DAILY FOR 3 DAYS. START OF OUTBREAK 6 tablet 2   rosuvastatin (CRESTOR) 20 MG tablet Take 1 tablet (20 mg total) by mouth daily. 90 tablet 3   No current facility-administered medications on file prior to visit.   Allergies  Allergen Reactions   Penicillins Rash    Has patient had a PCN reaction causing immediate rash, facial/tongue/throat swelling, SOB or lightheadedness with hypotension: no Has patient had a PCN reaction causing severe rash involving mucus membranes or skin necrosis: no Has patient had a PCN reaction that required hospitalization in the hospital at the time Has patient had a PCN reaction occurring within the last 10 years: no If all of the above answers are "NO", then may proceed with Cephalosporin use.       Current Medications, Allergies, Past Medical History, Past Surgical History, Family History and Social History were reviewed in [employer] record.   Review of Systems:   Constitutional: Negative for fever, sweats, chills or weight loss.  Respiratory: Negative for shortness of breath.   Cardiovascular: Negative for chest pain, palpitations and leg swelling.  Gastrointestinal: See HPI.  Musculoskeletal: Negative for back pain or muscle aches.  Neurological: Negative for dizziness, headaches or paresthesias.    Physical Exam: LMP 10/26/2013  BP 110/60   Pulse 75   Ht '5\' 5"'$  (1.651 m)   Wt (!) 366 lb (166 kg)   LMP 10/26/2013   BMI 60.91 kg/m   Wt Readings from Last 3 Encounters:  12/19/20 (!) 366 lb (166 kg)  08/28/20 (!) 371 lb 3.2  oz (168.4 kg)  08/08/20 (!) 353 lb (160.1 kg)    General: 55 year old female in no acute distress. Head: Normocephalic and atraumatic. Eyes: No scleral icterus. Conjunctiva pink . Ears: Normal auditory acuity. Mouth: Dentition intact. No ulcers or lesions.  Lungs: Clear throughout to auscultation. Heart: Regular rate and rhythm, no murmur. Abdomen: Soft, nondistended. Mild epigastric and LUQ tenderness without rebound or guarding. No masses or hepatomegaly. Normal bowel sounds x 4 quadrants.  Rectal: Deferred.  Musculoskeletal: Symmetrical with no gross deformities. Extremities: No edema. Neurological: Alert oriented x 4. No focal deficits.  Psychological: Alert and cooperative. Normal mood and affect  Assessment and Recommendations:  65) 55 year old female with a history of GERD, questionable water brash, a large hiatal hernia with daily vomiting, intermittent LUQ pain. CTAP identified a moderate hiatal hernia with intrathoracic position of the gastric fundus likely contributing to her GI symptoms.  -Increase Omeprazole '40mg'$  one po bid (may reduce water brash production) -Continue weight loss regimen and follow up  with [facility] bariatric specialist  -4 small snack sized meals recommended, do not eat within 3 to 4 hours of going to bed -If she continues to vomit in the setting of a mod to large hiatal hernia with intrathoracic gastric fundus, she may require hiatal hernia surgery prior to bariatric surgery. -I will consult with Dr. Loletha Carrow to verify if a repeat EGD is warranted at this time  2) Chronic anemia. No overt GI bleeding.  -CBC, Iron, iron saturation, TIBC and ferritin level   3) History of colon polyps. Colonoscopy 07/19/2020 identified a 42m benign polyp at the splenic flexure and a 680mtubular adenomatous polyp in the mid rectum which were removed. -Next colonoscopy wit ha 2 day bowel prep due 07/2023  4) DM   5) History of CVA, DVT/PE on Xarelto,

## 2020-12-19 NOTE — Patient Instructions (Addendum)
If you are age 56 or younger, your body mass index should be between 19-25. Your Body mass index is 60.91 kg/m. If this is out of the aformentioned range listed, please consider follow up with your Primary Care Provider.   The [facility] providers would like to encourage you to use [facility] to communicate with providers for non-urgent requests or questions.  Due to long hold times on the telephone, sending your provider a message by [facility] may be faster and more efficient way to get a response. Please allow 48 business hours for a response.  Please remember that this is for non-urgent requests/questions.  RECOMMENDATIONS: Increase Omeprazole to twice a day for one month. Take 1 tablet 30 minutes before breakfast and dinner but the morning dose needs to be taken one hour after your regular morning medications. We have sent this to the pharmacy.  LABS:  Lab work has been ordered for you today. Our lab is located in the basement. Press "B" on the elevator. The lab is located at the first door on the left as you exit the elevator.  HEALTHCARE LAWS AND MY CHART RESULTS: Due to recent changes in healthcare laws, you may see the results of your imaging and laboratory studies on MyChart before your provider has had a chance to review them.   We understand that in some cases there may be results that are confusing or concerning to you. Not all laboratory results come back in the same time frame and the provider may be waiting for multiple results in order to interpret others.  Please give [place] 48 hours in order for your provider to thoroughly review all the results before contacting the office for clarification of your results.   It was great seeing you today! Thank you for entrusting me with your care and choosing [facility].  Noralyn Pick, CRNP

## 2020-12-20 ENCOUNTER — Ambulatory Visit (INDEPENDENT_AMBULATORY_CARE_PROVIDER_SITE_OTHER): Payer: Medicare HMO | Admitting: Family Medicine

## 2020-12-20 ENCOUNTER — Other Ambulatory Visit: Payer: Self-pay

## 2020-12-20 ENCOUNTER — Encounter: Payer: Self-pay | Admitting: Family Medicine

## 2020-12-20 VITALS — BP 126/80 | HR 67 | Ht 65.0 in | Wt 365.8 lb

## 2020-12-20 DIAGNOSIS — R32 Unspecified urinary incontinence: Secondary | ICD-10-CM

## 2020-12-20 DIAGNOSIS — Z23 Encounter for immunization: Secondary | ICD-10-CM | POA: Diagnosis not present

## 2020-12-20 DIAGNOSIS — G4733 Obstructive sleep apnea (adult) (pediatric): Secondary | ICD-10-CM

## 2020-12-20 DIAGNOSIS — Z7901 Long term (current) use of anticoagulants: Secondary | ICD-10-CM | POA: Diagnosis not present

## 2020-12-20 MED ORDER — SHINGRIX 50 MCG/0.5ML IM SUSR: 0.5000 mL | Freq: Once | INTRAMUSCULAR | 1 refills | Status: AC

## 2020-12-20 MED ORDER — TETANUS-DIPHTH-ACELL PERTUSSIS 5-2.5-18.5 LF-MCG/0.5 IM SUSY: 0.5000 mL | PREFILLED_SYRINGE | Freq: Once | INTRAMUSCULAR | 0 refills | Status: AC

## 2020-12-20 MED ORDER — ALBUTEROL SULFATE HFA 108 (90 BASE) MCG/ACT IN AERS: 2.0000 | INHALATION_SPRAY | Freq: Four times a day (QID) | RESPIRATORY_TRACT | 3 refills | Status: DC | PRN

## 2020-12-20 NOTE — Progress Notes (Signed)
  Date of Visit: 12/20/2020   SUBJECTIVE:   HPI:  [name] presents today for routine follow up.  Anticoagulation - is on chronic anticoagulation with xarelto for two prior clotting episodes. Denies bleeding.   Incontinence - taking [name], which works well for her. Recently was told there was an interaction with dicyclomine, which was prescribed by GI. She has stopped the dicyclomine since then.  OSA - not using cpap at night due to cost and acid reflux. Does use albuterol occasionally (about once per week) at night to help with breathing.  Is still planning on pursuing weight loss surgery as well as hiatal hernia repair.   OBJECTIVE:   BP 126/80   Pulse 67   Ht '5\' 5"'$  (1.651 m)   Wt (!) 365 lb 12.8 oz (165.9 kg)   LMP 10/26/2013   SpO2 100%   BMI 60.87 kg/m  Gen: no acute distress, pleasant, cooperative HEENT: normocephalic, atraumatic  Heart: regular rate and rhythm, no murmur Lungs: clear to auscultation bilaterally, normal work of breathing  Neuro: alert, speech normal, grossly nonfocao  ASSESSMENT/PLAN:   Health maintenance:  - PCV 20 given today - handout given for shingrix and Tdap vaccines  OSA (obstructive sleep apnea) Not using CPAP Does use albuterol at night, will refill for as needed use since it is working well  Long term current use of anticoagulant therapy Recent normal CBC. No bleeding, continue xarelto.  Incontinence Stable on toviaz, continue  FOLLOW UP: Follow up in 6 mos for routine follow up   [name], [place]

## 2020-12-20 NOTE — Patient Instructions (Signed)
It was great to see you again today!  Take shingles and tetanus shot vaccines prescriptions to your pharmacy  Pneumonia shot today  Refilled albuterol  Follow up with me in 6 months, sooner if needed  Be well, Dr. [name]

## 2020-12-21 ENCOUNTER — Other Ambulatory Visit: Payer: Self-pay | Admitting: Nurse Practitioner

## 2020-12-23 ENCOUNTER — Other Ambulatory Visit: Payer: Self-pay | Admitting: Nurse Practitioner

## 2020-12-24 ENCOUNTER — Other Ambulatory Visit: Payer: Self-pay | Admitting: Nurse Practitioner

## 2020-12-24 NOTE — Progress Notes (Signed)
____________________________________________________________  Attending physician addendum:  Thank you for sending this case to me. I have reviewed the entire note and agree with the plan.  See my reply to your CTAP result note. Needs to see a bariatric surgeon for discussion on options for bariatric procedures and hiatal hernia repair. Repeat EGD not needed.  Wilfrid Lund, MD  ____________________________________________________________

## 2020-12-25 ENCOUNTER — Encounter: Payer: Self-pay | Admitting: Oncology

## 2020-12-25 ENCOUNTER — Other Ambulatory Visit (HOSPITAL_COMMUNITY): Payer: Self-pay

## 2020-12-25 ENCOUNTER — Telehealth: Payer: Self-pay

## 2020-12-25 DIAGNOSIS — U071 COVID-19: Secondary | ICD-10-CM

## 2020-12-25 MED ORDER — MOLNUPIRAVIR EUA 200MG CAPSULE
4.0000 | ORAL_CAPSULE | Freq: Two times a day (BID) | ORAL | 0 refills | Status: AC
  Filled 2020-12-25: qty 40, 5d supply, fill #0

## 2020-12-25 NOTE — Assessment & Plan Note (Signed)
Recent normal CBC. No bleeding, continue xarelto.

## 2020-12-25 NOTE — Telephone Encounter (Signed)
Called patient and spoke with her. Confirms symptoms began last night - initially with runny nose, now with sore throat and ear pain. Also body aches and chills. Tmax 100. No trouble breathing. Tolerating liquids. Rapid test was positive at home this AM x2. Patient is interested in antiviral therapy. As she takes xarelto, she is ineligible for paxlovid. Will send in molnupiravir to [place]. Discussed return precautions with patient, offered for her to call us if she has any new concerns. Patient very appreciative.  Leeanne Rio, MD

## 2020-12-25 NOTE — Assessment & Plan Note (Signed)
Not using CPAP Does use albuterol at night, will refill for as needed use since it is working well

## 2020-12-25 NOTE — Telephone Encounter (Signed)
Patient calls nurse line reporting positive covid test this morning. Patient reports symptom onset was yesterday with sore throat and ear pain. Patient denies fever, cough, or SOB. Conservative measures given to patient. Patient is interested in antivirals. Please advise.

## 2020-12-25 NOTE — Assessment & Plan Note (Signed)
Stable on toviaz, continue

## 2020-12-28 NOTE — Progress Notes (Signed)
Beth, pls contact the patient and let her know Dr. Loletha Carrow did not assess she needed a repeat EGD. Dr. Loletha Carrow advised for her to continue follow up with her bariatric surgeon Dr. Evorn Gong to evaluate for future bariatric surgery and hiatal hernia repair. Thx

## 2020-12-28 NOTE — Progress Notes (Signed)
Spoke with pt and she is aware of Dr. Loletha Carrow' recommendations.

## 2021-01-18 ENCOUNTER — Encounter: Payer: Self-pay | Admitting: Internal Medicine

## 2021-01-18 ENCOUNTER — Other Ambulatory Visit: Payer: Self-pay

## 2021-01-18 ENCOUNTER — Ambulatory Visit (INDEPENDENT_AMBULATORY_CARE_PROVIDER_SITE_OTHER): Payer: Medicare HMO | Admitting: Internal Medicine

## 2021-01-18 VITALS — BP 142/76 | HR 66 | Ht 65.0 in | Wt 363.4 lb

## 2021-01-18 DIAGNOSIS — Z01818 Encounter for other preprocedural examination: Secondary | ICD-10-CM

## 2021-01-18 DIAGNOSIS — Z79899 Other long term (current) drug therapy: Secondary | ICD-10-CM | POA: Diagnosis not present

## 2021-01-18 DIAGNOSIS — Z7901 Long term (current) use of anticoagulants: Secondary | ICD-10-CM

## 2021-01-18 DIAGNOSIS — R778 Other specified abnormalities of plasma proteins: Secondary | ICD-10-CM

## 2021-01-18 DIAGNOSIS — G4733 Obstructive sleep apnea (adult) (pediatric): Secondary | ICD-10-CM

## 2021-01-18 DIAGNOSIS — I10 Essential (primary) hypertension: Secondary | ICD-10-CM

## 2021-01-18 DIAGNOSIS — I5032 Chronic diastolic (congestive) heart failure: Secondary | ICD-10-CM

## 2021-01-18 DIAGNOSIS — Z86711 Personal history of pulmonary embolism: Secondary | ICD-10-CM

## 2021-01-18 DIAGNOSIS — E785 Hyperlipidemia, unspecified: Secondary | ICD-10-CM

## 2021-01-18 MED ORDER — METOPROLOL TARTRATE 50 MG PO TABS: 50.0000 mg | ORAL_TABLET | Freq: Once | ORAL | 0 refills | Status: DC

## 2021-01-18 MED ORDER — AMLODIPINE BESYLATE 5 MG PO TABS: 5.0000 mg | ORAL_TABLET | Freq: Every day | ORAL | 3 refills | Status: DC

## 2021-01-18 NOTE — Progress Notes (Signed)
Cardiology Office Note:    Date:  10/14//2022   ID:  [name], DOB November 08, 1965, MRN [id number]  PCP:  Leeanne Rio, MD  Cardiologist:  Elouise Munroe, MD  Electrophysiologist:  None   Referring MD: Leeanne Rio, MD   Chief Complaint/Reason for Referral: HTN  History of Present Illness:    [name] is a 55 y.o. female with a history of morbid obesity, endometrial and ovarian cancer, hypertension, hyperlipidemia, DVT/PE who presents today for follow-up.   01/18/2021: On 07/19/2020 she was admitted to the hospital with community acquired pneumonia. Since then her diet and exercise routines have been understandably disrupted.  During that hospitalization for significant episode of pneumonia, she did have a mild troponin elevation with a significant delta, 14> 50> 69> 65.  She has no known history of coronary artery disease.  Overall, she is feeling better. She reports she was taking dicyclomine that was discontinued about a month ago due to possible medication interactions with her rosuvastatin. She was feeling ill and fatigued from both of these medications, even with down-titration. Both are discontinued at this time. She had a CT abdomen that returned normal results.   She has deferred her bariatric surgery to give herself time to recover from her medicinal side effects.  Lately she has been having difficulty receiving her antihypertensives from [facility]. Her blood pressure is high today because she has run out, and is unable to refill her prescription for another 3 days.  The patient denies chest pain, chest pressure, palpitations, PND, orthopnea, or leg swelling. Denies cough, fever, chills. Denies nausea, vomiting. Denies syncope or presyncope. Denies dizziness or lightheadedness. Denies snoring.  Three weeks ago she was infected with Covid, and reports only having mild symptoms. She has been vaccinated with 1 booster.  Of note, her port is still  in place. She is not scheduled to follow-up with her oncologist until next year.  Past Medical History:  Diagnosis Date   Allergy    Anemia    Anxiety    Arthritis    Blood transfusion without reported diagnosis    Cough    Cough 06/11/2018   Difficulty sleeping 06/11/2018   Diverticulitis with perforation 2010   DVT (deep vein thrombosis) in pregnancy 11/24/2016   DVT, lower extremity (HCC)    Endometrial cancer ([place])    Enlarged lymph nodes 08/13/2017   Family history of adverse reaction to anesthesia    pts daughter had difficulty awakening following anesthesia, long time to wake up   GERD (gastroesophageal reflux disease)    Headache    Hemoptysis 04/22/2018   History of bronchitis    History of ear infections    Hospital discharge follow-up 03/02/2019   Hx of migraines    Hyperlipidemia    Hypertension    IBS (irritable bowel syndrome)    Kidney infection    Lupus (systemic lupus erythematosus) ([place]) 02/2017   Morbid obesity ([place])    Neck pain 01/13/2019   Ovarian cancer ([place]) dx'd 01/2015   PONV (postoperative nausea and vomiting)    Port-A-Cath in place    Right side   Portacath in place 09/12/2015   Pre-diabetes    Pyelonephritis    Right knee pain 11/28/2011   Scleroderma ([place])    Screen for colon cancer 03/02/2019   Slow rate of speech 08/22/2015   Chronic from CVA.  She also has loss of left nasolabial fold and slight left facial droop/small asymmetry on the left side  secondary to CVA   UTI (urinary tract infection) 05/07/2018    Past Surgical History:  Procedure Laterality Date   ABDOMINAL HYSTERECTOMY N/A 03/13/2015   Procedure: TOTAL HYSTERECTOMY ABDOMINAL BILATERAL SALPINGO OOPHORECTOMY RADICAL TUMOR [place];  Surgeon: Everitt Amber, MD;  Location: [facility];  Service: Gynecology;  Laterality: N/A;   BOWEL RESECTION  03/13/2015   Procedure: SMALL BOWEL RESECTION;  Surgeon: Everitt Amber, MD;  Location: [facility];  Service: Gynecology;;   [place] and 1984    COLONOSCOPY WITH PROPOFOL N/A 07/19/2020   Procedure: COLONOSCOPY WITH PROPOFOL;  Surgeon: Doran Stabler, MD;  Location: [facility];  Service: Gastroenterology;  Laterality: N/A;   COLOSTOMY  2008   COLOSTOMY TAKEDOWN     ESOPHAGOGASTRODUODENOSCOPY (EGD) WITH PROPOFOL N/A 12/21/2018   Procedure: ESOPHAGOGASTRODUODENOSCOPY (EGD) WITH PROPOFOL;  Surgeon: Doran Stabler, MD;  [facility];  Service: Gastroenterology;  Laterality: N/A;   HEMOSTASIS CLIP PLACEMENT  07/19/2020   Procedure: HEMOSTASIS CLIP PLACEMENT;  Surgeon: Doran Stabler, MD;  Location: [facility];  Service: Gastroenterology;;   LAPAROTOMY N/A 03/13/2015   Procedure: EXPLORATORY LAPAROTOMY;  Surgeon: Everitt Amber, MD;  [facility]: [facility];  Service: Gynecology;  Laterality: N/A;   POLYPECTOMY  07/19/2020   Procedure: POLYPECTOMY;  Surgeon: Doran Stabler, MD;  Location: [facility];  Service: Gastroenterology;;   TONSILLECTOMY AND [place]  03/13/2015   Procedure: HERNIA REPAIR VENTRAL ADULT;  Surgeon: Everitt Amber, MD;  Location: [facility];  Service: Gynecology;;    Current Medications: Current Meds  Medication Sig   acetaminophen (TYLENOL) 500 MG tablet Take 1,000 mg by mouth every 6 (six) hours as needed for moderate pain.   albuterol (VENTOLIN HFA) 108 (90 Base) MCG/ACT inhaler Inhale 2 puffs into the lungs every 6 (six) hours as needed for wheezing or shortness of breath.   amLODipine (NORVASC) 5 MG tablet Take 1 tablet (5 mg total) by mouth daily.   B-D UF III MINI PEN NEEDLES 31G X 5 MM MISC    Cholecalciferol (VITAMIN D) 50 MCG (2000 UT) CAPS Take 8,000 Units by mouth daily.   diclofenac Sodium (VOLTAREN) 1 % GEL Apply to knee once daily as needed (Patient taking differently: Apply 1 application topically daily as needed (knee pain). Apply to knee once daily as needed)   fesoterodine (TOVIAZ) 8 MG TB24 tablet TAKE 1 TABLET(8 MG) BY MOUTH DAILY   furosemide (LASIX) 20  MG tablet Take 20 mg by mouth daily as needed for edema.   Insulin Pen Needle 31G X 5 MM MISC Use as directed with Victoza   lidocaine-prilocaine (EMLA) cream Apply 1 application topically as needed (port access).   liraglutide (VICTOZA) 18 MG/3ML SOPN Inject 1.8 mg into the skin daily.   omeprazole (PRILOSEC) 40 MG capsule TAKE 1 CAPSULE(40 MG) BY MOUTH TWICE DAILY BEFORE A MEAL   rivaroxaban (XARELTO) 20 MG TABS tablet TAKE 1 TABLET(20 MG) BY MOUTH EVERY MORNING   triamcinolone (KENALOG) 0.1 % APPLY TO THE AFFECTED AREA TWICE DAILY AS NEEDED FOR SKIN IRRITATION (Patient taking differently: Apply 1 application topically 2 (two) times daily as needed (irritation).)   valACYclovir (VALTREX) 500 MG tablet TAKE 1 TABLET(500 MG) BY MOUTH TWICE DAILY FOR 3 DAYS. START OF OUTBREAK     Allergies:   Penicillins   Social History   Tobacco Use   Smoking status: Never   Smokeless tobacco: Never  Vaping Use   Vaping  Use: Never used  Substance Use Topics   Alcohol use: Yes    Comment: Maybe wine every 6 months   Drug use: No     Family History: The patient's family history includes Asthma in her maternal aunt; Colon polyps in her mother; Diabetes in her daughter, mother, paternal aunt, paternal aunt, paternal grandfather, and paternal uncle; Fibroids in her mother; Hodgkin's lymphoma (age of onset: 107) in her daughter; Hyperlipidemia in her father and mother; Hypertension in her mother; Kidney failure (age of onset: 71) in her maternal grandmother; Pancreatic cancer in her paternal grandmother; Prostate cancer (age of onset: 76) in her maternal uncle. There is no history of Heart disease, Stroke, Colon cancer, Stomach cancer, Esophageal cancer, or Rectal cancer.  ROS:   Please see the history of present illness.    All other systems reviewed and are negative.  EKGs/Labs/Other Studies Reviewed:    The following studies were reviewed today:  CTA Chest 07/19/2020: COMPARISON:  05/29/2018    FINDINGS: Cardiovascular: Limited because of injection pressure limitations from the port catheter and body habitus.   No large central or proximal hilar acute filling defect or pulmonary embolus by CTA. Pulmonary arteries are normal in caliber. Limited assessment of the peripheral segmental and subsegmental branches for small emboli.   Intact aorta. Pulsation artifact across the ascending aorta. No aneurysm or dissection. No mediastinal hemorrhage or hematoma. Normal heart size without pericardial effusion. Right IJ power port catheter tip in the right atrium.   Mediastinum/Nodes: Thyroid unremarkable. Trachea and central airways are patent. Lower esophagus is fluid filled with an air-fluid level. Small to moderate hiatal hernia noted. No bulky adenopathy.   Lungs/Pleura: Patchy bilateral airspace process involving all lobes of both lungs but certainly more severe and consolidative throughout the left lung compatible with infectious/inflammatory process such as pneumonia possibly related to aspiration. Other acute pneumonitis or acute pulmonary hemorrhage could have a similar appearance.   No pleural abnormality, effusion or pneumothorax.   Upper Abdomen: No acute abnormality.   Musculoskeletal: Degenerative changes of the thoracic spine. Obese body habitus. No acute compression fracture. Intact sternum.   Review of the MIP images confirms the above findings.   IMPRESSION: Limited CTA but no significant central or proximal hilar large acute pulmonary embolus. Difficult to exclude small peripheral emboli.   Asymmetric diffuse bilateral patchy airspace process, more consolidated throughout the left lung and most severe in the left lower lobe. Findings concerning for acute pneumonia possibly related to aspiration. Other acute inflammatory pneumonitis, or acute pulmonary hemorrhage could have a similar appearance.   Dilated esophagus with an air-fluid level and associated  hiatal hernia.  Echo 10/11/2019: Sonographer Comments: Patient is morbidly obese. Image acquisition  challenging due to patient body habitus.  IMPRESSIONS    1. Left ventricular ejection fraction, by estimation, is 60 to 65%. The  left ventricle has normal function. The left ventricle has no regional  wall motion abnormalities. There is mild left ventricular hypertrophy.  Left ventricular diastolic parameters  are consistent with Grade I diastolic dysfunction (impaired relaxation).   2. Right ventricular systolic function is normal. The right ventricular  size is normal.   3. The mitral valve is grossly normal. No evidence of mitral valve  regurgitation.   4. The aortic valve is tricuspid. Aortic valve regurgitation is not  visualized.   5. The inferior vena cava is normal in size with greater than 50%  respiratory variability, suggesting right atrial pressure of 3 mmHg.   LE  Venous DVT 02/19/2019: Summary:  Right: Findings appear essentially unchanged compared to previous  examination. There is no evidence of deep vein thrombosis proximal to the inguinal ligament or in the common femoral vein.   Echo TTE 11/24/2016: Study Conclusions   - Left ventricle: The cavity size was normal. Wall thickness was    normal. Systolic function was normal. The estimated ejection    fraction was in the range of 60% to 65%. Wall motion was normal;    there were no regional wall motion abnormalities. Doppler    parameters are consistent with abnormal left ventricular    relaxation (grade 1 diastolic dysfunction).   Impressions:   - Normal LV systolic function; mild diastolic dysfunction.   EKG:   01/18/2021: Sinus rhythm. Rate 66 bpm. 07/12/2020: NSR, poor R wave progression, no change from prior.    Recent Labs: 07/19/2020: B Natriuretic Peptide 181.4 11/26/2020: ALT 10 12/11/2020: BUN 25; Creatinine, Ser 1.17; Potassium 4.7; Sodium 139 12/19/2020: Hemoglobin 12.3; Platelets 234.0   Recent  Lipid Panel    Component Value Date/Time   CHOL 224 (H) 08/28/2020 1242   TRIG 106 08/28/2020 1242   HDL 56 08/28/2020 1242   CHOLHDL 4.0 08/28/2020 1242   CHOLHDL 4.5 01/01/2018 1010   VLDL 19 01/01/2018 1010   LDLCALC 149 (H) 08/28/2020 1242   LDLDIRECT 164 (H) 07/26/2013 0933    Physical Exam:    VS:  BP (!) 142/76   Pulse 66   Ht 5\' 5"  (1.651 m)   Wt (!) 363 lb 6.4 oz (164.8 kg)   LMP 10/26/2013   SpO2 99%   BMI 60.47 kg/m     Wt Readings from Last 5 Encounters:  01/18/21 (!) 363 lb 6.4 oz (164.8 kg)  12/20/20 (!) 365 lb 12.8 oz (165.9 kg)  12/19/20 (!) 366 lb (166 kg)  08/28/20 (!) 371 lb 3.2 oz (168.4 kg)  08/08/20 (!) 353 lb (160.1 kg)    Constitutional: No acute distress Eyes: sclera non-icteric, normal conjunctiva and lids ENMT: normal dentition, moist mucous membranes Cardiovascular: regular rhythm, normal rate, no murmurs. S1 and S2 normal. Radial pulses normal bilaterally. No jugular venous distention.  Respiratory: clear to auscultation bilaterally GI : normal bowel sounds, soft and nontender. No distention.   MSK: extremities warm, well perfused. No edema.  NEURO: grossly nonfocal exam, moves all extremities. PSYCH: alert and oriented x 3, normal mood and affect.   ASSESSMENT:    1. Elevated troponin   2. Medication management   3. Chronic diastolic CHF (congestive heart failure) ([place])   4. Essential hypertension, benign   5. Morbid obesity ([place])   6. History of pulmonary embolism   7. Long term current use of anticoagulant therapy   8. Hyperlipidemia, unspecified hyperlipidemia type   9. OSA (obstructive sleep apnea)   10. Pre-op evaluation     PLAN:    Elevated troponin- She had a mildly elevated troponin during a significant episode of pneumonia.  This could represent demand phenomenon, however she is anticipating bariatric surgery when she is feeling better, and is concerned about her cardiovascular risk.  We discussed options for stress  testing.  Given body habitus all will be subject to artifact as well as increased radiation dose.  I think the easiest way to answer the question about the presence or absence of obstructive CAD would be with a coronary CTA.  I have discussed this in detail with the CT lab and technologist as well as the nurse navigator.  BMI 60 may lead to difficulty interpreting the mid to distal vessels, but if we can clear the proximal vessels and obtain a high-quality coronary calcium score this will be helpful and restratification.  Hyperlipidemia, unspecified hyperlipidemia type -  -Currently off of rosuvastatin due to side effects.  We will determine after coronary CTA if she needs to resume this therapy and we will review lipids at that time.  Essential hypertension, benign - BP well controlled on amlodipine and prn lasix.  History of pulmonary embolism Long term current use of anticoagulant therapy - continues on Xarelto, no bleeding  Morbid obesity ([place])  - continues to pursue weight loss, surgery on hold at the moment due to recent medication side effects.   Follow-up in 1 month following Coronary CTA.   Total time of encounter: 30 minutes total time of encounter, including 20 minutes spent in face-to-face patient care on the date of this encounter. This time includes coordination of care and counseling regarding above mentioned problem list. Remainder of non-face-to-face time involved reviewing chart documents/testing relevant to the patient encounter and documentation in the medical record. I have independently reviewed documentation from referring provider.   Cherlynn Kaiser, MD, [facility]    Medication Adjustments/Labs and Tests Ordered: Current medicines are reviewed at length with the patient today.  Concerns regarding medicines are outlined above.   Orders Placed This Encounter  Procedures   CT CORONARY MORPH W/CTA COR W/SCORE W/CA W/CM &/OR WO/CM   Basic  metabolic panel   EKG [id number]    Meds ordered this encounter  Medications   metoprolol tartrate (LOPRESSOR) 50 MG tablet    Sig: Take 1 tablet (50 mg total) by mouth once for 1 dose. PLEASE TAKE METOPROLOL 2  HOURS PRIOR TO CTA SCAN.    Dispense:  1 tablet    Refill:  0   amLODipine (NORVASC) 5 MG tablet    Sig: Take 1 tablet (5 mg total) by mouth daily.    Dispense:  90 tablet    Refill:  3    Patient Instructions  Medication Instructions:  PLEASE TAKE 50mg  METOPROLOL 2 HOURS PRIOR TO CCTA  *If you need a refill on your cardiac medications before your next appointment, please call your pharmacy*  Lab Work: [facility]- [place]  If you have labs (blood work) drawn today and your tests are completely normal, you will receive your results only by: [place] (if you have MyChart) OR A paper copy in the mail If you have any lab test that is abnormal or we need to change your treatment, we will call you to review the results.  Testing/Procedures: Your physician has requested that you have cardiac CT. Cardiac computed tomography (CT) is a painless test that uses an x-ray machine to take clear, detailed pictures of your heart. For further information please visit HugeFiesta.[email or web address]. Please follow instruction sheet as given.  Follow-Up: At [facility], you and your health needs are our priority.  As part of our continuing mission to provide you with exceptional heart care, we have created designated Provider Care Teams.  These Care Teams include your primary Cardiologist (physician) and Advanced Practice Providers (APPs -  Physician Assistants and Nurse Practitioners) who all work together to provide you with the care you need, when you need it.  We recommend signing up for the patient portal called "MyChart".  Sign up information is provided on this After Visit Summary.  MyChart is used  to connect with patients for Virtual Visits (Telemedicine).  Patients are able to  view lab/test results, encounter notes, upcoming appointments, etc.  Non-urgent messages can be sent to your provider as well.   To learn more about what you can do with MyChart, go to [email or web address].    Your next appointment:   1 month(s)  The format for your next appointment:   In Person  Provider:   You may see Elouise Munroe, MD or one of the following Advanced Practice Providers on your designated Care Team:   Sande Rives PA-C Almyra Deforest PA-C  Other Instructions   Your cardiac CT will be scheduled at one of the below locations:   [facility] [address] [place], [place] [address] [phone]  If scheduled at [facility], please arrive at the [facility] main entrance (entrance A) of [facility] 30 minutes prior to test start time. Proceed to the [facility] Radiology Department (first floor) to check-in and test prep.  Please follow these instructions carefully (unless otherwise directed):  On the Night Before the Test: Be sure to Drink plenty of water. Do not consume any caffeinated/decaffeinated beverages or chocolate 12 hours prior to your test. Do not take any antihistamines 12 hours prior to your test.  On the Day of the Test: Drink plenty of water until 1 hour prior to the test. Do not eat any food 4 hours prior to the test. You may take your regular medications prior to the test.  Take metoprolol (Lopressor) 50mg  two hours prior to test. HOLD Furosemide/Hydrochlorothiazide morning of the test. FEMALES- please wear underwire-free bra if available, avoid dresses & tight clothing      After the Test: Drink plenty of water. After receiving IV contrast, you may experience a mild flushed feeling. This is normal. On occasion, you may experience a mild rash up to 24 hours after the test. This is not dangerous. If this occurs, you can take Benadryl 25 mg and increase your fluid intake. If you experience trouble breathing,  this can be serious. If it is severe call 911 IMMEDIATELY. If it is mild, please call our office. If you take any of these medications: Glipizide/Metformin, Avandament, Glucavance, please do not take 48 hours after completing test unless otherwise instructed.  Please allow 2-4 weeks for scheduling of routine cardiac CTs. Some insurance companies require a pre-authorization which may delay scheduling of this test.   For non-scheduling related questions, please contact the cardiac imaging nurse navigator should you have any questions/concerns: Marchia Bond, Cardiac Imaging Nurse Navigator Gordy Clement, Cardiac Imaging Nurse Navigator [facility] Direct Office Dial: [phone]   For scheduling needs, including cancellations and rescheduling, please call [place], [phone].     I,Mathew Stumpf,acting as a Education administrator for Elouise Munroe, MD.,have documented all relevant documentation on the behalf of Elouise Munroe, MD,as directed by  Elouise Munroe, MD while in the presence of Elouise Munroe, MD.  I, Elouise Munroe, MD, have reviewed all documentation for this visit. The documentation on 01/18/21 for the exam, diagnosis, procedures, and orders are all accurate and complete.

## 2021-01-18 NOTE — Patient Instructions (Addendum)
Medication Instructions:  PLEASE TAKE 50mg  METOPROLOL 2 HOURS PRIOR TO CCTA  *If you need a refill on your cardiac medications before your next appointment, please call your pharmacy*  Lab Work: [facility] [place]  If you have labs (blood work) drawn today and your tests are completely normal, you will receive your results only by: [place] (if you have MyChart) OR A paper copy in the mail If you have any lab test that is abnormal or we need to change your treatment, we will call you to review the results.  Testing/Procedures: Your physician has requested that you have cardiac CT. Cardiac computed tomography (CT) is a painless test that uses an x-ray machine to take clear, detailed pictures of your heart. For further information please visit [email or web address]. Please follow instruction sheet as given.  Follow-Up: At [facility], you and your health needs are our priority.  As part of our continuing mission to provide you with exceptional heart care, we have created designated Provider Care Teams.  These Care Teams include your primary Cardiologist (physician) and Advanced Practice Providers (APPs -  Physician Assistants and Nurse Practitioners) who all work together to provide you with the care you need, when you need it.  We recommend signing up for the patient portal called "MyChart".  Sign up information is provided on this After Visit Summary.  MyChart is used to connect with patients for Virtual Visits (Telemedicine).  Patients are able to view lab/test results, encounter notes, upcoming appointments, etc.  Non-urgent messages can be sent to your provider as well.   To learn more about what you can do with MyChart, go to [email or web address].    Your next appointment:   1 month(s)  The format for your next appointment:   In Person  Provider:   You may see Elouise Munroe, MD or one of the following Advanced Practice Providers on your designated Care  Team:   Sande Rives PA-C Almyra Deforest PA-C  Other Instructions   Your cardiac CT will be scheduled at one of the below locations:   [facility] [address] [place], [place] [address] [phone]  If scheduled at [facility], please arrive at the [facility] main entrance (entrance A) of [facility] 30 minutes prior to test start time. Proceed to the [facility] Radiology Department (first floor) to check-in and test prep.  Please follow these instructions carefully (unless otherwise directed):  On the Night Before the Test: Be sure to Drink plenty of water. Do not consume any caffeinated/decaffeinated beverages or chocolate 12 hours prior to your test. Do not take any antihistamines 12 hours prior to your test.  On the Day of the Test: Drink plenty of water until 1 hour prior to the test. Do not eat any food 4 hours prior to the test. You may take your regular medications prior to the test.  Take metoprolol (Lopressor) 50mg  two hours prior to test. HOLD Furosemide/Hydrochlorothiazide morning of the test. FEMALES- please wear underwire-free bra if available, avoid dresses & tight clothing      After the Test: Drink plenty of water. After receiving IV contrast, you may experience a mild flushed feeling. This is normal. On occasion, you may experience a mild rash up to 24 hours after the test. This is not dangerous. If this occurs, you can take Benadryl 25 mg and increase your fluid intake. If you experience trouble breathing, this can be serious. If it is  severe call 911 IMMEDIATELY. If it is mild, please call our office. If you take any of these medications: Glipizide/Metformin, Avandament, Glucavance, please do not take 48 hours after completing test unless otherwise instructed.  Please allow 2-4 weeks for scheduling of routine cardiac CTs. Some insurance companies require a pre-authorization which may delay scheduling of this test.   For  non-scheduling related questions, please contact the cardiac imaging nurse navigator should you have any questions/concerns: Marchia Bond, Cardiac Imaging Nurse Navigator Gordy Clement, Cardiac Imaging Nurse Navigator [facility] Direct Office Dial: [phone]   For scheduling needs, including cancellations and rescheduling, please call [place], [phone].

## 2021-01-22 LAB — BASIC METABOLIC PANEL
BUN/Creatinine Ratio: 20 (ref 9–23)
BUN: 20 mg/dL (ref 6–24)
CO2: 25 mmol/L (ref 20–29)
Calcium: 9.6 mg/dL (ref 8.7–10.2)
Chloride: 103 mmol/L (ref 96–106)
Creatinine, Ser: 0.98 mg/dL (ref 0.57–1.00)
Glucose: 81 mg/dL (ref 70–99)
Potassium: 4.5 mmol/L (ref 3.5–5.2)
Sodium: 141 mmol/L (ref 134–144)
eGFR: 68 mL/min/{1.73_m2} (ref >59)

## 2021-01-24 ENCOUNTER — Telehealth (HOSPITAL_COMMUNITY): Payer: Self-pay | Admitting: *Deleted

## 2021-01-24 NOTE — Telephone Encounter (Signed)
Reaching out to patient to offer assistance regarding upcoming cardiac imaging study; pt verbalizes understanding of appt date/time, parking situation and where to check in, pre-test NPO status and medications ordered, and verified current allergies; name and call back number provided for further questions should they arise  Gordy Clement RN Navigator Cardiac Imaging [name] [phone] office [phone] cell  Patient to take 50mg  metoprolol tartrate two hours prior to cardiac CT.  Pt has a port.

## 2021-01-25 ENCOUNTER — Ambulatory Visit (HOSPITAL_COMMUNITY)
Admission: RE | Admit: 2021-01-25 | Discharge: 2021-01-25 | Disposition: A | Discharge status: Home / Self Care | Payer: Medicare HMO | Source: Ambulatory Visit | Attending: Internal Medicine | Admitting: Internal Medicine

## 2021-01-25 ENCOUNTER — Other Ambulatory Visit: Payer: Self-pay

## 2021-01-25 DIAGNOSIS — R0609 Other forms of dyspnea: Secondary | ICD-10-CM | POA: Diagnosis not present

## 2021-01-25 DIAGNOSIS — R778 Other specified abnormalities of plasma proteins: Secondary | ICD-10-CM | POA: Diagnosis present

## 2021-01-25 DIAGNOSIS — I281 Aneurysm of pulmonary artery: Secondary | ICD-10-CM

## 2021-01-25 DIAGNOSIS — I700 Atherosclerosis of aorta: Secondary | ICD-10-CM | POA: Insufficient documentation

## 2021-01-25 MED ORDER — NITROGLYCERIN 0.4 MG SL SUBL: 0.8000 mg | SUBLINGUAL_TABLET | Freq: Once | SUBLINGUAL | Status: AC

## 2021-01-25 MED ORDER — IOHEXOL 350 MG/ML SOLN
140.0000 mL | Freq: Once | INTRAVENOUS | Status: AC | PRN
  Administered 2021-01-25: 140 mL via INTRAVENOUS

## 2021-01-25 MED ORDER — DILTIAZEM HCL 25 MG/5ML IV SOLN
INTRAVENOUS | Status: AC
  Administered 2021-01-25: 10 mg via INTRAVENOUS
  Filled 2021-01-25: qty 5

## 2021-01-25 MED ORDER — DILTIAZEM HCL 25 MG/5ML IV SOLN
10.0000 mg | Freq: Once | INTRAVENOUS | Status: AC
  Administered 2021-01-25: 10 mg via INTRAVENOUS

## 2021-01-25 MED ORDER — DILTIAZEM HCL 25 MG/5ML IV SOLN: 10.0000 mg | Freq: Once | INTRAVENOUS | Status: AC

## 2021-01-25 MED ORDER — NITROGLYCERIN 0.4 MG SL SUBL
SUBLINGUAL_TABLET | SUBLINGUAL | Status: AC
  Administered 2021-01-25: 0.8 mg via SUBLINGUAL
  Filled 2021-01-25: qty 2

## 2021-01-25 MED ORDER — METOPROLOL TARTRATE 5 MG/5ML IV SOLN: 5.0000 mg | INTRAVENOUS | Status: DC | PRN

## 2021-01-25 MED ORDER — METOPROLOL TARTRATE 5 MG/5ML IV SOLN
INTRAVENOUS | Status: AC
  Administered 2021-01-25: 5 mg via INTRAVENOUS
  Filled 2021-01-25: qty 10

## 2021-02-05 ENCOUNTER — Ambulatory Visit: Payer: Medicare HMO | Admitting: Physician Assistant

## 2021-03-14 ENCOUNTER — Telehealth: Payer: Self-pay

## 2021-03-14 ENCOUNTER — Other Ambulatory Visit: Payer: Self-pay

## 2021-03-14 ENCOUNTER — Ambulatory Visit (INDEPENDENT_AMBULATORY_CARE_PROVIDER_SITE_OTHER): Payer: Medicare HMO | Admitting: Student

## 2021-03-14 ENCOUNTER — Encounter: Payer: Self-pay | Admitting: Student

## 2021-03-14 VITALS — BP 142/76 | HR 82 | Temp 99.7°F | Ht 65.0 in | Wt 369.4 lb

## 2021-03-14 DIAGNOSIS — M545 Low back pain, unspecified: Secondary | ICD-10-CM | POA: Diagnosis not present

## 2021-03-14 MED ORDER — ACETAMINOPHEN 500 MG PO TABS: 1000.0000 mg | ORAL_TABLET | Freq: Four times a day (QID) | ORAL | 0 refills | Status: AC | PRN

## 2021-03-14 MED ORDER — DICLOFENAC SODIUM 1 % EX GEL: CUTANEOUS | 1 refills | Status: DC

## 2021-03-14 MED ORDER — LIDOCAINE 5 % EX PTCH: 1.0000 | MEDICATED_PATCH | CUTANEOUS | 0 refills | Status: DC

## 2021-03-14 NOTE — Patient Instructions (Addendum)
It was wonderful to meet you today. Thank you for allowing me to be a part of your care. Below is a short summary of what we discussed at your visit today:  Your back pain is most likely due to muscle sprain.  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  Pain control which you take 1000 mg of Tylenol, no more than one in 6 hours  Apply the lidocaine patch or Voltaren gel on the affected area.  Do not combine the two.   Please bring all of your medications to every appointment!  If you have any questions or concerns, please do not hesitate to contact us via phone or MyChart message.   Alen Bleacher, MD [facility]

## 2021-03-14 NOTE — Telephone Encounter (Signed)
Patient calls nurse line regarding back pain. Patient reports that last Friday she turned and heard a pop. States that her "back her been out" since then. Patient has been taking tylenol, performing back exercises and applying heat to try and alleviate pain. Patient still reports unmanaged pain.   Patient reports difficulty movement and difficulty with ADLs. Patient denies numbness or tingling in legs or incontinence of bowel or bladder.   Offered to schedule patient appointment. Patient would like to wait to hear from provider regarding recommendations.   Please advise.   Talbot Grumbling, RN

## 2021-03-14 NOTE — Telephone Encounter (Signed)
Called patient and schedule for same day appointment with Dr. Adah Salvage at 4:00 pm.   Talbot Grumbling, RN

## 2021-03-14 NOTE — Progress Notes (Signed)
    SUBJECTIVE:   CHIEF COMPLAINT / HPI:  [name] is a 55 year old female who presents with 1 week of right lower back pain.  She reports her pain started last week towards the after she made a sharp turn to pick up an item from her closet, she is snap sound followed by pain on her right lower back.  Improved over the weekend however has worsened in the last 3 days.  She has had 2 previous history of muscle sprain however this time this is more intense and has lasted longer than the previous experiences.  Pain reports sharp pain with movement but dull at rest.Mild to no relieve with pain heat to relieve and Tylenol.  Denies any recent fall, hematuria, incontinence or  paresthesia.  PERTINENT  PMH / PSH: HTN, CHF, GERD  OBJECTIVE:   BP (!) 142/76   Pulse 82   Temp 99.7 F (37.6 C) (Oral)   Ht 5\' 5"  (1.651 m)   Wt (!) 369 lb 6.4 oz (167.6 kg)   LMP 10/26/2013   SpO2 97%   BMI 61.47 kg/m     Physical Exam General: Alert, well appearing, NAD, Oriented x4 Cardiovascular: RRR, No Murmurs, Normal S2/S2 Respiratory: CTAB, No wheezing or Rales Abdomen: No distension or tenderness Extremities: No edema on extremities   Musculoskeletal: right lower back tenderness with palpation Skin: Warm and dry  ASSESSMENT/PLAN:   Right lower back pain Patient presents with chief complaint of right lower back pain after a quick turn. She has no paresthesia, incontinence, recent fall or trauma. On exam she has positive tenderness on the right lower back which is more indicative of muscle sprain rather than lumbar radiculopathy or kidney stones. Given no fever, hematuria or urinary frequency there is less concern for pyelonephritis. -Increased her tylenol dose -Ordered Voltaren gel and Lidocaine patch  - Encouraged ambulation and mild stretching exercises -Discussed benefit of weight loss.      Alen Bleacher, MD [place]

## 2021-03-14 NOTE — Telephone Encounter (Signed)
Called patient back pain.  She reports 1 week of back pain.  She has tried some topical agents ice and heat.  No numbness or bowel or bladder symptoms.  Recommend scheduling a visit in our office.  Reviewed return precautions.  Nursing please reach out to patient and schedule visit.

## 2021-03-15 ENCOUNTER — Telehealth: Payer: Self-pay | Admitting: Family Medicine

## 2021-03-15 MED ORDER — BACLOFEN 10 MG PO TABS: 10.0000 mg | ORAL_TABLET | Freq: Two times a day (BID) | ORAL | 0 refills | Status: DC | PRN

## 2021-03-15 NOTE — Telephone Encounter (Signed)
Received Rx request for many diabetes test supplies from [facility] [employer]. Do not see this on file in chart. Suspect this may be advertisement related. Nursing please call patient and let us know if she uses this pharmacy.  Dorris Singh, MD  Family Medicine Teaching Service

## 2021-03-15 NOTE — Telephone Encounter (Signed)
Called patient about pain.  We discussed this at length.  She is had no change in her symptoms but no improvement in pain.  She reports she already has all of the therapies recommended by Dr. Adah Salvage.  New prescription for baclofen to pharmacy.  We discussed side effects at length.  All questions answered.  Recommend returning to care and consideration of imaging in the future if the pain changes or persist.

## 2021-03-15 NOTE — Telephone Encounter (Signed)
Patient returns call to nurse line regarding pain management. Patient reports that she has already been doing patches, gel and tylenol and this has not been working.   Patient reports taking 3, 500 mg tablets 2- 3 times per day. Advised provider recommendation for tylenol. Patient states that she would like to speak with provider further regarding pain management plan.   Please advise.   Talbot Grumbling, RN

## 2021-03-18 NOTE — Telephone Encounter (Signed)
Called and spoke to patient and she confirmed that she has never heard of this [place] choice pharmacy and has not changed her pharmacy from [facility] [address].  [name], [place]

## 2021-03-26 ENCOUNTER — Other Ambulatory Visit: Payer: Self-pay | Admitting: Family Medicine

## 2021-03-26 DIAGNOSIS — Z1231 Encounter for screening mammogram for malignant neoplasm of breast: Secondary | ICD-10-CM

## 2021-04-16 ENCOUNTER — Telehealth: Payer: Self-pay

## 2021-04-16 NOTE — Telephone Encounter (Signed)
Patient calls nurse line regarding continued back pain. Patient has been taking baclofen as prescribed. Patient is requesting next steps as pain has been persistent.   Attempted to call patient back. No answer, VM full.   Will attempt to reach patient at later date.   Talbot Grumbling, RN

## 2021-04-17 NOTE — Telephone Encounter (Addendum)
I recommend the patient be seen given ongoing pain despite these measures.Nursing- please call patient and help her schedule a visit.   Dorris Singh, MD  Family Medicine Teaching Service

## 2021-04-17 NOTE — Telephone Encounter (Signed)
Patient returns call to nurse line regarding continued back pain. Patient has been taking muscle relaxer and stretches to help with back pain.   Per last note from Dr. Owens Shark, next steps may include imaging. Offered patient appointment for re-evaluation. Patient wanted to see if imaging could be ordered without having f/u appointment.   Forwarding to PCP and Dr. Owens Shark.   Please advise.   Talbot Grumbling, RN

## 2021-04-17 NOTE — Telephone Encounter (Signed)
Patient scheduled for 11/3.

## 2021-04-19 ENCOUNTER — Ambulatory Visit (INDEPENDENT_AMBULATORY_CARE_PROVIDER_SITE_OTHER): Payer: Medicare HMO | Admitting: Family Medicine

## 2021-04-19 ENCOUNTER — Other Ambulatory Visit: Payer: Self-pay

## 2021-04-19 ENCOUNTER — Encounter: Payer: Self-pay | Admitting: Family Medicine

## 2021-04-19 VITALS — BP 126/65 | HR 79 | Ht 65.0 in | Wt 360.4 lb

## 2021-04-19 DIAGNOSIS — M545 Low back pain, unspecified: Secondary | ICD-10-CM

## 2021-04-19 DIAGNOSIS — M546 Pain in thoracic spine: Secondary | ICD-10-CM | POA: Diagnosis not present

## 2021-04-19 MED ORDER — TRIAMCINOLONE ACETONIDE 0.1 % EX CREA: 1.0000 "application " | TOPICAL_CREAM | Freq: Two times a day (BID) | CUTANEOUS | 0 refills | Status: DC | PRN

## 2021-04-19 MED ORDER — BACLOFEN 10 MG PO TABS: 10.0000 mg | ORAL_TABLET | Freq: Two times a day (BID) | ORAL | 0 refills | Status: DC | PRN

## 2021-04-19 NOTE — Progress Notes (Signed)
° ° °  SUBJECTIVE:   CHIEF COMPLAINT / HPI:   Continued back pain Patient reports that her back pain was initially getting better but then last week when she was putting on her footies, that she strained her back again. She does not have any bowel/bladder incontinence, radiation from the spine. She was previously prescribed Baclofen and still has a dose left but is wanting to know if she can get refills. She does not use the everyday and is continuing to do her conservative management at home with lidocaine patches, Tylenol, stretches/exercises.  She is working towards her bariatric surgery with weight loss but is currently at a standstill with her weight due to the pain.   PERTINENT  PMH / PSH: Reviewed  OBJECTIVE:   BP 126/65    Pulse 79    Ht 5\' 5"  (1.651 m)    Wt (!) 360 lb 6.4 oz (163.5 kg)    LMP 10/26/2013    SpO2 100%    BMI 59.97 kg/m   General: NAD, well-appearing, well-nourished Respiratory: No respiratory distress, breathing comfortably, able to speak in full sentences Skin: warm and dry, no rashes noted on exposed skin Psych: Appropriate affect and mood MSK:TTP in the right thoracic paraspinal muscles and along the distribution of the latissimus dorsi muscle. Pain with palpation of the right sacral sulcus. Pain with rotation of the trunk and mildly limited ROM  ASSESSMENT/PLAN:   Back pain Pain is more consistent with an MSK etiology as previously described. Pain extends for paraspinal thoracic to lumbar region on the right, also consistent with a lat dorsi distribution. No concerns at this time for spinal etiology and will defer imaging, though counseled patient on return precautions.  - Refill Baclofen - Referral to PT placed - Patient actively working towards weight loss for bariatric surgery  Medication refill Refill provided for triamcinolone cream.   Rise Patience, [place]

## 2021-04-19 NOTE — Patient Instructions (Signed)
It was so great seeing you today! Today we discussed the following:  -For your back pain, I sent in a refill of the muscle relaxer (baclofen).  I have also sent a referral to physical therapy.  If you have any sudden acute changes or feel like you having worsening of your pain, inability to get up or move then please let us know emergently may need to get imaging  -I sent in the refill for your triamcinolone   Please make sure to bring any medications you take to your appointments. If you have any questions or concerns please call the office at [phone].

## 2021-04-22 NOTE — Progress Notes (Signed)
Office Visit    Patient Name: [name] Date of Encounter: 04/23/2021  Primary Care Provider:  Leeanne Rio, MD Primary Cardiologist:  Elouise Munroe, MD  Chief Complaint   56 year old female with a history of hypertension, hyperlipidemia, elevated troponin in the setting of pneumonia, DVT/PE, morbid obesity, endometrial and ovarian cancer who presents for follow-up related to hypertension and follow-up coronary CTA.  Past Medical History    Past Medical History:  Diagnosis Date   Allergy    Anemia    Anxiety    Arthritis    Blood transfusion without reported diagnosis    Cough    Cough 06/11/2018   Difficulty sleeping 06/11/2018   Diverticulitis with perforation 2010   DVT (deep vein thrombosis) in pregnancy 11/24/2016   DVT, lower extremity (HCC)    Endometrial cancer ([place])    Enlarged lymph nodes 08/13/2017   Family history of adverse reaction to anesthesia    pts daughter had difficulty awakening following anesthesia, long time to wake up   GERD (gastroesophageal reflux disease)    Headache    Hemoptysis 04/22/2018   History of bronchitis    History of ear infections    Hospital discharge follow-up 03/02/2019   Hx of migraines    Hyperlipidemia    Hypertension    IBS (irritable bowel syndrome)    Kidney infection    Lupus (systemic lupus erythematosus) ([place]) 02/2017   Morbid obesity ([place])    Neck pain 01/13/2019   Ovarian cancer ([place]) dx'd 01/2015   PONV (postoperative nausea and vomiting)    Port-A-Cath in place    Right side   Portacath in place 09/12/2015   Pre-diabetes    Pyelonephritis    Right knee pain 11/28/2011   Scleroderma ([place])    Screen for colon cancer 03/02/2019   Slow rate of speech 08/22/2015   Chronic from CVA.  She also has loss of left nasolabial fold and slight left facial droop/small asymmetry on the left side secondary to CVA   UTI (urinary tract infection) 05/07/2018   Past Surgical History:  Procedure Laterality Date    ABDOMINAL HYSTERECTOMY N/A 03/13/2015   Procedure: TOTAL HYSTERECTOMY ABDOMINAL BILATERAL SALPINGO OOPHORECTOMY RADICAL TUMOR [place];  Surgeon: Everitt Amber, MD;  Location: [facility];  Service: Gynecology;  Laterality: N/A;   BOWEL RESECTION  03/13/2015   Procedure: SMALL BOWEL RESECTION;  Surgeon: Everitt Amber, MD;  Location: [facility];  Service: Gynecology;;   [place] and 1984   COLONOSCOPY WITH PROPOFOL N/A 07/19/2020   Procedure: COLONOSCOPY WITH PROPOFOL;  Surgeon: Doran Stabler, MD;  Location: [facility];  Service: Gastroenterology;  Laterality: N/A;   COLOSTOMY  2008   COLOSTOMY TAKEDOWN     ESOPHAGOGASTRODUODENOSCOPY (EGD) WITH PROPOFOL N/A 12/21/2018   Procedure: ESOPHAGOGASTRODUODENOSCOPY (EGD) WITH PROPOFOL;  Surgeon: Doran Stabler, MD;  Location: [facility];  Service: Gastroenterology;  Laterality: N/A;   HEMOSTASIS CLIP PLACEMENT  07/19/2020   Procedure: HEMOSTASIS CLIP PLACEMENT;  Surgeon: Doran Stabler, MD;  Location: [facility];  Service: Gastroenterology;;   LAPAROTOMY N/A 03/13/2015   Procedure: EXPLORATORY LAPAROTOMY;  Surgeon: Everitt Amber, MD;  Location: [facility];  Service: Gynecology;  Laterality: N/A;   POLYPECTOMY  07/19/2020   Procedure: POLYPECTOMY;  Surgeon: Doran Stabler, MD;  Location: [facility];  Service: Gastroenterology;;   TONSILLECTOMY AND [place]  03/13/2015   Procedure: HERNIA REPAIR VENTRAL ADULT;  Surgeon: Everitt Amber, MD;  Location: [facility];  Service: Gynecology;;    Allergies  Allergies  Allergen Reactions   Penicillins Rash    Has patient had a PCN reaction causing immediate rash, facial/tongue/throat swelling, SOB or lightheadedness with hypotension: no Has patient had a PCN reaction causing severe rash involving mucus membranes or skin necrosis: no Has patient had a PCN reaction that required hospitalization in the hospital at the time Has patient had a PCN reaction occurring within  the last 10 years: no If all of the above answers are "NO", then may proceed with Cephalosporin use.     History of Present Illness    56 year old female with the above past medical history including hypertension, hyperlipidemia, elevated troponin in the setting of pneumonia, DVT/PE, morbid obesity, endometrial and ovarian cancer.  Echocardiogram in 2021 showed EF of 60 to 65%, normal LV function, mild LVH, G1 DD.  She was hospitalized in April 2022 with community- acquired pneumonia.  She had a mild elevation in troponin at the time with no significant delta, no known history of CAD.  She was last seen in the office on January 18, 2021 and was doing well from a cardiac standpoint.  She was concerned about her cardiovascular risk at the time as she was anticipating bariatric surgery.  Coronary CT angiogram was ordered which showed no evidence of CAD.  Of note, she was started on Crestor prior to her coronary CTA.  She did experience significant myalgias.  Given reassuring CT result, Crestor was discontinued.  She presents today for follow-up.  Since her last visit and well from a cardiac standpoint.  She has been exercising and working on dietary changes and has lost over 40 pounds.  She is anticipating bariatric surgery in the next few months to help with continued weight loss goals.  She reports feeling well, and has no concerns or complaints today.  Home Medications    Current Outpatient Medications  Medication Sig Dispense Refill   acetaminophen (TYLENOL) 500 MG tablet Take 2 tablets (1,000 mg total) by mouth every 6 (six) hours as needed for moderate pain. 30 tablet 0   albuterol (VENTOLIN HFA) 108 (90 Base) MCG/ACT inhaler Inhale 2 puffs into the lungs every 6 (six) hours as needed for wheezing or shortness of breath. 18 g 3   amLODipine (NORVASC) 5 MG tablet Take 1 tablet (5 mg total) by mouth daily. 90 tablet 3   B-D UF III MINI PEN NEEDLES 31G X 5 MM MISC      baclofen (LIORESAL) 10 MG  tablet Take 1 tablet (10 mg total) by mouth 2 (two) times daily as needed for muscle spasms. 30 each 0   Cholecalciferol (VITAMIN D) 50 MCG (2000 UT) CAPS Take 8,000 Units by mouth daily.     diclofenac Sodium (VOLTAREN) 1 % GEL Apply to knee once daily as needed 100 g 1   fesoterodine (TOVIAZ) 8 MG TB24 tablet TAKE 1 TABLET(8 MG) BY MOUTH DAILY 90 tablet 2   furosemide (LASIX) 20 MG tablet Take 20 mg by mouth daily as needed for edema.     Insulin Pen Needle 31G X 5 MM MISC Use as directed with Victoza     lidocaine (LIDODERM) 5 % Place 1 patch onto the skin daily. Remove & Discard patch within 12 hours or as directed by MD 30 patch 0   lidocaine-prilocaine (EMLA) cream Apply 1 application topically as needed (port access).     liraglutide (VICTOZA) 18 MG/3ML  SOPN Inject 1.8 mg into the skin daily.     omeprazole (PRILOSEC) 40 MG capsule TAKE 1 CAPSULE(40 MG) BY MOUTH TWICE DAILY BEFORE A MEAL 180 capsule 1   rivaroxaban (XARELTO) 20 MG TABS tablet TAKE 1 TABLET(20 MG) BY MOUTH EVERY MORNING 90 tablet 0   triamcinolone cream (KENALOG) 0.1 % Apply 1 application topically 2 (two) times daily as needed. APPLY TO THE AFFECTED AREA TWICE DAILY AS NEEDED FOR SKIN IRRITATION 45 g 0   valACYclovir (VALTREX) 500 MG tablet TAKE 1 TABLET(500 MG) BY MOUTH TWICE DAILY FOR 3 DAYS. START OF OUTBREAK 6 tablet 2   metoprolol tartrate (LOPRESSOR) 50 MG tablet Take 1 tablet (50 mg total) by mouth once for 1 dose. PLEASE TAKE METOPROLOL 2  HOURS PRIOR TO CTA SCAN. 1 tablet 0   No current facility-administered medications for this visit.     Review of Systems    She denies chest pain, palpitations, dyspnea, pnd, orthopnea, n, v, dizziness, syncope, edema, weight gain, or early satiety. All other systems reviewed and are otherwise negative except as noted above.   Physical Exam    VS:  BP 123/73    Pulse 75    Ht 5\' 5"  (1.651 m)    Wt (!) 368 lb 12.8 oz (167.3 kg)    LMP 10/26/2013    SpO2 99%    BMI 61.37 kg/m   GEN: Well nourished, well developed, in no acute distress. HEENT: normal. Neck: Supple, no JVD, carotid bruits, or masses. Cardiac: RRR, no murmurs, rubs, or gallops. No clubbing, cyanosis, edema.  Radials/DP/PT 2+ and equal bilaterally.  Respiratory:  Respirations regular and unlabored, clear to auscultation bilaterally. GI: Soft, obese, nontender, nondistended, BS + x 4. MS: no deformity or atrophy. Skin: warm and dry, no rash. Neuro:  Strength and sensation are intact. Psych: Normal affect.  Accessory Clinical Findings    ECG personally reviewed by me today - No EKG in office today.   Lab Results  Component Value Date   WBC 8.5 12/19/2020   HGB 12.3 12/19/2020   HCT 38.3 12/19/2020   MCV 88.4 12/19/2020   PLT 234.0 12/19/2020   Lab Results  Component Value Date   CREATININE 0.98 01/22/2021   BUN 20 01/22/2021   NA 141 01/22/2021   K 4.5 01/22/2021   CL 103 01/22/2021   CO2 25 01/22/2021   Lab Results  Component Value Date   ALT 10 11/26/2020   AST 15 11/26/2020   ALKPHOS 83 11/26/2020   BILITOT 0.4 11/26/2020   Lab Results  Component Value Date   CHOL 224 (H) 08/28/2020   HDL 56 08/28/2020   LDLCALC 149 (H) 08/28/2020   LDLDIRECT 164 (H) 07/26/2013   TRIG 106 08/28/2020   CHOLHDL 4.0 08/28/2020    Lab Results  Component Value Date   HGBA1C 5.5 07/20/2020    Assessment & Plan    1. Elevated troponin: History of mild elevation in troponin during hospitalization in April 2022 in the setting of pneumonia. Coronary CT angiogram 01/2021 showed no evidence of CAD. Echocardiogram in 2021 showed EF of 60 to 65%, normal LV function, mild LVH, G1 DD. Stable with no anginal symptoms. No indication for further ischemic evaluation.  Previously on statin, however this was discontinued due to significant myalgias.  Has been working on lifestyle modifications with diet and exercise and has lost over 40 pounds in anticipation of bariatric surgery for weight loss.  We will  repeat fasting lipid panel.  She will return for this as she is not fasting today.  She will likely not tolerate statin in future.  The 10-year ASCVD risk score (Arnett DK, et al., 2019) is: 5%. Continue to encourage lifestyle modifications and reassess at follow-up visit.  2. Grade I diastolic dysfunction:  Echocardiogram in 2021 showed EF of 60 to 65%, normal LV function, mild LVH, G1 DD. Euvolemic and well compensated on exam. Continue as needed Lasix.  3. Hypertension: BP well controlled. Continue current antihypertensive regimen.   4. Hyperlipidemia: LDL was 149 on 08/28/2020. Crestor was previously discontinued. Repeat lipids today as above.   5. H/oh PE: On chronic anticoagulation with Xarelto.  No signs of bleeding.  Managed by PCP.    6. Morbid obesity: She is working on lifestyle modifications with diet and exercise as above.  She is hoping to have bariatric surgery in the next 3 to 4 months.  7. Preoperative cardiac exam:  According to the Revised Cardiac Risk Index (RCRI), her Perioperative Risk of Major Cardiac Event is (%): 6.6 Her Functional Capacity in METs is: 7.99 according to the [employer] Activity Status Index (DASI). Therefore, based on ACC/AHA guidelines, patient would be at acceptable risk for the planned procedure without further cardiovascular testing. Xarelto managed per PCP.    8. Disposition: Follow-up in 6 months.   Lenna Sciara, NP 04/23/2021, 1:46 PM

## 2021-04-23 ENCOUNTER — Encounter: Payer: Self-pay | Admitting: Nurse Practitioner

## 2021-04-23 ENCOUNTER — Ambulatory Visit (INDEPENDENT_AMBULATORY_CARE_PROVIDER_SITE_OTHER): Payer: Medicare HMO | Admitting: Nurse Practitioner

## 2021-04-23 ENCOUNTER — Other Ambulatory Visit: Payer: Self-pay

## 2021-04-23 VITALS — BP 123/73 | HR 75 | Ht 65.0 in | Wt 368.8 lb

## 2021-04-23 DIAGNOSIS — Z01810 Encounter for preprocedural cardiovascular examination: Secondary | ICD-10-CM

## 2021-04-23 DIAGNOSIS — I10 Essential (primary) hypertension: Secondary | ICD-10-CM | POA: Diagnosis not present

## 2021-04-23 DIAGNOSIS — Z86711 Personal history of pulmonary embolism: Secondary | ICD-10-CM

## 2021-04-23 DIAGNOSIS — R7989 Other specified abnormal findings of blood chemistry: Secondary | ICD-10-CM

## 2021-04-23 DIAGNOSIS — R778 Other specified abnormalities of plasma proteins: Secondary | ICD-10-CM

## 2021-04-23 DIAGNOSIS — Z01818 Encounter for other preprocedural examination: Secondary | ICD-10-CM

## 2021-04-23 DIAGNOSIS — I5189 Other ill-defined heart diseases: Secondary | ICD-10-CM

## 2021-04-23 DIAGNOSIS — E782 Mixed hyperlipidemia: Secondary | ICD-10-CM

## 2021-04-23 NOTE — Patient Instructions (Signed)
Medication Instructions:  Your physician recommends that you continue on your current medications as directed. Please refer to the Current Medication list given to you today.  *If you need a refill on your cardiac medications before your next appointment, please call your pharmacy*  Lab Work: Your physician recommends that you return for lab work TODAY Fasting Lipid Panel  If you have labs (blood work) drawn today and your tests are completely normal, you will receive your results only by: MyChart Message (if you have MyChart) OR A paper copy in the mail If you have any lab test that is abnormal or we need to change your treatment, we will call you to review the results.  Testing/Procedures: NONE ordered at this time of appointment   Follow-Up: At [facility], you and your health needs are our priority.  As part of our continuing mission to provide you with exceptional heart care, we have created designated Provider Care Teams.  These Care Teams include your primary Cardiologist (physician) and Advanced Practice Providers (APPs -  Physician Assistants and Nurse Practitioners) who all work together to provide you with the care you need, when you need it.  We recommend signing up for the patient portal called "MyChart".  Sign up information is provided on this After Visit Summary.  MyChart is used to connect with patients for Virtual Visits (Telemedicine).  Patients are able to view lab/test results, encounter notes, upcoming appointments, etc.  Non-urgent messages can be sent to your provider as well.   To learn more about what you can do with MyChart, go to [email or web address].    Your next appointment:   6 month(s)  The format for your next appointment:   In Person  Provider:   Elouise Munroe, MD    Other Instructions

## 2021-04-25 NOTE — Therapy (Signed)
OUTPATIENT PHYSICAL THERAPY THORACOLUMBAR EVALUATION   Patient Name: [name] MRN: [id number] DOB:Jan 15, 1966, 56 y.o., female Today's Date: 04/29/2021   PT End of Session - 04/29/21 1101     Visit Number 1    Number of Visits 13    Date for PT Re-Evaluation 06/15/21    Authorization Type [employer] primary; M[employer] secondary- requesting auth    PT Start Time 1101    PT Stop Time 1143    PT Time Calculation (min) 42 min    Activity Tolerance Patient tolerated treatment well    Behavior During Therapy WFL for tasks assessed/performed             Past Medical History:  Diagnosis Date   Allergy    Anemia    Anxiety    Arthritis    Blood transfusion without reported diagnosis    Cough    Cough 06/11/2018   Difficulty sleeping 06/11/2018   Diverticulitis with perforation 2010   DVT (deep vein thrombosis) in pregnancy 11/24/2016   DVT, lower extremity (HCC)    Endometrial cancer ([place])    Enlarged lymph nodes 08/13/2017   Family history of adverse reaction to anesthesia    pts daughter had difficulty awakening following anesthesia, long time to wake up   GERD (gastroesophageal reflux disease)    Headache    Hemoptysis 04/22/2018   History of bronchitis    History of ear infections    Hospital discharge follow-up 03/02/2019   Hx of migraines    Hyperlipidemia    Hypertension    IBS (irritable bowel syndrome)    Kidney infection    Lupus (systemic lupus erythematosus) ([place]) 02/2017   Morbid obesity ([place])    Neck pain 01/13/2019   Ovarian cancer ([place]) dx'd 01/2015   PONV (postoperative nausea and vomiting)    Port-A-Cath in place    Right side   Portacath in place 09/12/2015   Pre-diabetes    Pyelonephritis    Right knee pain 11/28/2011   Scleroderma ([place])    Screen for colon cancer 03/02/2019   Slow rate of speech 08/22/2015   Chronic from CVA.  She also has loss of left nasolabial fold and slight left facial droop/small asymmetry on the left side secondary to CVA   UTI  (urinary tract infection) 05/07/2018   Past Surgical History:  Procedure Laterality Date   ABDOMINAL HYSTERECTOMY N/A 03/13/2015   Procedure: TOTAL HYSTERECTOMY ABDOMINAL BILATERAL SALPINGO OOPHORECTOMY RADICAL TUMOR [place];  Surgeon: Everitt Amber, MD;  Location: [facility];  Service: Gynecology;  Laterality: N/A;   BOWEL RESECTION  03/13/2015   Procedure: SMALL BOWEL RESECTION;  Surgeon: Everitt Amber, MD;  Location: [facility];  Service: Gynecology;;   [place] and 1984   COLONOSCOPY WITH PROPOFOL N/A 07/19/2020   Procedure: COLONOSCOPY WITH PROPOFOL;  Surgeon: Doran Stabler, MD;  Location: [facility];  Service: Gastroenterology;  Laterality: N/A;   COLOSTOMY  2008   COLOSTOMY TAKEDOWN     ESOPHAGOGASTRODUODENOSCOPY (EGD) WITH PROPOFOL N/A 12/21/2018   Procedure: ESOPHAGOGASTRODUODENOSCOPY (EGD) WITH PROPOFOL;  Surgeon: Doran Stabler, MD;  [facility]: [facility];  Service: Gastroenterology;  Laterality: N/A;   HEMOSTASIS CLIP PLACEMENT  07/19/2020   Procedure: HEMOSTASIS CLIP PLACEMENT;  Surgeon: Doran Stabler, MD;  Location: [facility];  Service: Gastroenterology;;   LAPAROTOMY N/A 03/13/2015   Procedure: EXPLORATORY LAPAROTOMY;  Surgeon: Everitt Amber, MD;  Location: [facility];  Service: Gynecology;  Laterality: N/A;   POLYPECTOMY  07/19/2020  Procedure: POLYPECTOMY;  Surgeon: Doran Stabler, MD;  Location: [facility];  Service: Gastroenterology;;   TONSILLECTOMY AND [place]  03/13/2015   Procedure: HERNIA REPAIR VENTRAL ADULT;  Surgeon: Everitt Amber, MD;  Location: [facility];  Service: Gynecology;;   Patient Active Problem List   Diagnosis Date Noted   Chronic diastolic CHF (congestive heart failure) ([place]) 07/19/2020   Bronchospasm 07/19/2020   Incontinence 03/17/2020   Herpes simplex virus (HSV) infection of buttock 01/05/2020   OSA (obstructive sleep apnea) 07/25/2019   Prediabetes 07/25/2019   Lateral epicondylitis of left elbow  01/13/2019   Normocytic anemia 05/29/2018   Scleroderma ([place]) 04/22/2018   Swelling of foot joint, right 04/08/2018   Positive ANA (antinuclear antibody) 09/23/2016   Eczema 01/27/2016   History of pulmonary embolism 09/12/2015   Long term current use of anticoagulant therapy 09/06/2015   Chemotherapy-induced peripheral neuropathy ([place]) 06/13/2015   CKD (chronic kidney disease) stage 3, GFR 30-59 ml/min ([place]) 05/12/2015   GERD (gastroesophageal reflux disease) 05/12/2015   Ovarian carcinosarcoma, right ([place]) 03/29/2015   Endometrial ca ([place]) 03/29/2015   Ventral hernia without obstruction or gangrene    Morbid obesity ([place]) 02/19/2015   Essential hypertension, benign    Stress at home 12/14/2014   Left knee pain 08/04/2013   Seasonal allergies 07/29/2013    PCP: Leeanne Rio, MD  REFERRING PROVIDER: Rise Patience, DO (resident) Jamal Collin. Hensel MD  REFERRING DIAG: M54.6 (ICD-10-CM) - Acute right-sided thoracic back pain   THERAPY DIAG:  Chronic bilateral low back pain without sciatica  Pain in thoracic spine  Cramp and spasm  Muscle weakness (generalized)  ONSET DATE: acute on chronic back pain; recent exacerbation December 2022   SUBJECTIVE:                                                                                                                                                                                           SUBJECTIVE STATEMENT: Patient reports her back pain is a reoccurring injury. Her back "gave out" right before Christmas and was prescribed a muscle relaxer. She recalls turning when she was walking and heard a pop that caused pain. She noticed after Christmas she was still in a lot of pain and was instructed to continue with medication and exercises by her MD. She reached back out to her physician in mid-January when it worsened. She bent over to put on her footies and her back "gave out" on her again. She reports she is struggling due to the  pain and can't even do a simple tasks, like washing her dishes. She  reports this same pain has happened previously a few years ago, but this is more severe and has lingered for over a month whereas previously episodes of back pain have resolved fairly quickly. She denies any numbness or tingling or changes in bowel/bladder.  PERTINENT HISTORY:  Currently in remission from endometrial and ovarian cancer   PAIN:  Are you having pain? Yes NPRS scale: 7/10 Pain location: mid/lower back Rt side  PAIN TYPE: dull Pain description: constant  Aggravating factors: pulling, lifting, reaching, prolonged standing Relieving factors: rest   PRECAUTIONS: None  WEIGHT BEARING RESTRICTIONS No  FALLS:  Has patient fallen in last 6 months? No, Number of falls: 0  LIVING ENVIRONMENT: Lives with: lives with their spouse Lives in: House/apartment Stairs: Yes; 2 stairs to enter  Has following equipment at home: Single point cane  OCCUPATION: disability   PLOF: modified independent with ADLs (increased time/rest needed)   PATIENT GOALS "I want to get back to doing things. I want to get weight loss surgery, but I'm not doing my exercising because of the pain."    OBJECTIVE:   DIAGNOSTIC FINDINGS:  N/A  PATIENT SURVEYS:  FOTO 36% function to 56% predicted   SCREENING FOR RED FLAGS: Bowel or bladder incontinence: No Cauda equina syndrome: No  COGNITION:  Overall cognitive status: Within functional limits for tasks assessed     SENSATION:  Light touch: Appears intact    PALPATION: [place] and palpable Rt thoracolumbar paraspinals.  PAIVM unable to accurately assess due to body habitus   LUMBARAROM/PROM  A/PROM A/PROM  04/29/2021  Flexion Full; increased pain low back  Extension Full; decreases pain   Right lateral flexion 25% limited   Left lateral flexion 50% limited, pulling Rt side low back  Right rotation 25% limited   Left rotation 50% limited pulling Rt side low back     (Blank rows = not tested)   LE MMT:  MMT Right 04/29/2021 Left 04/29/2021  Hip flexion 4; pn low back  4+  Hip extension    Hip abduction 3+; pn low back  3+  Hip adduction    Hip internal rotation    Hip external rotation    Knee flexion    Knee extension    Ankle dorsiflexion    Ankle plantarflexion    Ankle inversion    Ankle eversion     (Blank rows = not tested)  LUMBAR SPECIAL TESTS:  (+) SLR RLE   FUNCTIONAL TESTS:  5 times sit to stand: 15 seconds, increased low back pain  GAIT: Distance walked: 10 ft  Assistive device utilized: Single point cane Level of assistance: Modified independence Comments: WBOS, lateral trunk lean    TODAY'S TREATMENT  [facility] Adult PT Treatment:                                                DATE: 04/29/21 Therapeutic Exercise: Demonstrated and issued initial HEP  Therapeutic Activity: Education on assessment findings that will be addressed throughout duration of POC   PATIENT EDUCATION:  Education details: see treatment above  Person educated: Patient Education method: Explanation, Demonstration, Tactile cues, Verbal cues, and Handouts Education comprehension: verbalized understanding, returned demonstration, verbal cues required, tactile cues required, and needs further education   HOME EXERCISE PROGRAM: Access Code: [email or web address] URL: [email or web address] Date: 04/29/2021 Prepared by: Gwendolyn Grant  Exercises Supine Lower  Trunk Rotation - 2 x daily - 7 x weekly - 2 sets - 10 reps Supine Pelvic Tilt - 2 x daily - 7 x weekly - 2 sets - 10 reps Supine Figure 4 Piriformis Stretch - 2 x daily - 7 x weekly - 3 sets - 30 hold   ASSESSMENT:  CLINICAL IMPRESSION: Patient is a 56 y.o. female who was seen today for physical therapy evaluation and treatment for of acute on chronic mid/lower back pain. Objective impairments include decreased activity tolerance, decreased mobility, difficulty walking, decreased ROM,  decreased strength, increased fascial restrictions, increased muscle spasms, obesity, and pain. These impairments are limiting patient from cleaning, community activity, laundry, and shopping. Personal factors including Age, Fitness, and Time since onset of injury/illness/exacerbation are also affecting patient's functional outcome. Patient will benefit from skilled PT to address above impairments and improve overall function.  REHAB POTENTIAL: Good  CLINICAL DECISION MAKING: Stable/uncomplicated  EVALUATION COMPLEXITY: Low   GOALS: Goals reviewed with patient? No    LONG TERM GOALS:   LTG Name Target Date Goal status  1 Patient will demonstrate pain free lumbar AROM to improve ability to complete bending and lifting activity.  Baseline:see objective  06/10/2021 INITIAL  2 Patient will complete 5 x STS in < 12 seconds without increased back pain to signify improvements in functional strength.  Baseline: see objective 06/10/2021 INITIAL  3 Patient will tolerate at least 20 minutes of standing activity to improve tolerance to cleaning and grocery shopping Baseline: reports frequent rest breaks with cleaning at home and can only tolerate walking 1-2 aisles at grocery store currently.  06/10/2021 INITIAL  4 Patient will be independent with advanced home program to assist in management of her chronic condition.  Baseline: initial HEP issued  06/10/2021 INITIAL  5 Patient will score 56% function on FOTO to signify clinically meaningful improvement in functional abilities.  06/10/21 Initial    PLAN: PT FREQUENCY: 2x/week  PT DURATION: 6 weeks  PLANNED INTERVENTIONS: Therapeutic exercises, Therapeutic activity, Neuro Muscular re-education, Balance training, Gait training, Patient/Family education, Stair training, Aquatic Therapy, Dry Needling, Cryotherapy, Moist heat, and Manual therapy  PLAN FOR NEXT SESSION: review HEP, review FOTO, manual to L-spine, spinal mobility, hip/core strength    Referring diagnosis? Acute right-sided thoracic back pain  Treatment diagnosis? (if different than referring diagnosis)  Chronic bilateral low back pain without sciatica  Pain in thoracic spine  Cramp and spasm  Muscle weakness (generalized) What was this (referring dx) caused by? []  Surgery []  Fall [x]  Ongoing issue []  Arthritis []  Other: ____________  Laterality: []  Rt []  Lt [x]  Both  Check all possible CPT codes:  *CHOOSE 10 OR LESS*    [x]  97110 (Therapeutic Exercise)  []  16967 (SLP Treatment)  [x]  97112 (Neuro Re-ed)   []  92526 (Swallowing Treatment)   [x]  97116 (Gait Training)   []  [id number] (Cognitive Training, 1st 15 minutes) [x]  97140 (Manual Therapy)   []  97130 (Cognitive Training, each add'l 15 minutes)  [x]  97530 (Therapeutic Activities)  []  Other, List CPT Code ____________    [x]  97535 (Self Care)       [x]  All codes above (97110 - 97535)  []  97012 (Mechanical Traction)  []  97014 (E-stim Unattended)  []  97032 (E-stim manual)  []  97033 (Ionto)  []  97035 (Ultrasound)  []  97760 (Orthotic Fit) []  [id number] (Physical Performance Training) [x]  [id number] (Aquatic Therapy) []  97034 (Contrast Bath) []  [id number] (Paraffin) []  97597 (Wound Care 1st 20 sq cm) []  97598 (Wound Care each add'l  20 sq cm) []  97016 (Vasopneumatic Device) []  [phone] Comptroller) []  [phone] (Prosthetic Training) Gwendolyn Grant, PT, DPT, ATC 04/29/21 1:04 PM

## 2021-04-27 LAB — LIPID PANEL
Chol/HDL Ratio: 4 ratio (ref 0.0–4.4)
Cholesterol, Total: 225 mg/dL — ABNORMAL HIGH (ref 100–199)
HDL: 56 mg/dL (ref >39)
LDL Chol Calc (NIH): 154 mg/dL — ABNORMAL HIGH (ref 0–99)
Triglycerides: 84 mg/dL (ref 0–149)
VLDL Cholesterol Cal: 15 mg/dL (ref 5–40)

## 2021-04-29 ENCOUNTER — Ambulatory Visit: Payer: Medicare HMO | Attending: Family Medicine

## 2021-04-29 ENCOUNTER — Other Ambulatory Visit: Payer: Self-pay

## 2021-04-29 ENCOUNTER — Telehealth: Payer: Self-pay

## 2021-04-29 DIAGNOSIS — M546 Pain in thoracic spine: Secondary | ICD-10-CM | POA: Insufficient documentation

## 2021-04-29 DIAGNOSIS — G8929 Other chronic pain: Secondary | ICD-10-CM | POA: Insufficient documentation

## 2021-04-29 DIAGNOSIS — R252 Cramp and spasm: Secondary | ICD-10-CM | POA: Insufficient documentation

## 2021-04-29 DIAGNOSIS — M545 Low back pain, unspecified: Secondary | ICD-10-CM | POA: Insufficient documentation

## 2021-04-29 DIAGNOSIS — M6281 Muscle weakness (generalized): Secondary | ICD-10-CM | POA: Diagnosis present

## 2021-04-29 NOTE — Patient Instructions (Signed)
Aquatic Therapy at [facility]-  What to Expect!  Where:   [facility] [address] [facility] [address] [place], [place] [address] Rehab phone [phone]  NOTE:  You will receive an automated phone message reminding you of your appt and it will say the appointment is at the [address] [facility] [address] [facility] clinic.          How to Prepare: Please make sure you drink 8 ounces of water about one hour prior to your pool session A caregiver may attend if needed with the patient to help assist as needed. A caregiver can sit in the pool room on chair. Please arrive IN YOUR SUIT and 15 minutes prior to your appointment - this helps to avoid delays in starting your session. Please make sure to attend to any toileting needs prior to entering the pool Locker rooms for changing are provided.   There is direct access to the pool deck form the locker room.  You can lock your belongings in a locker with lock provided. Once on the pool deck your therapist will ask if you have signed the Patient  Consent and Assignment of Benefits form before beginning treatment Your therapist may take your blood pressure prior to, during and after your session if indicated We usually try and create a home exercise program based on activities we do in the pool.  Please be thinking about who might be able to assist you in the pool should you need to participate in an aquatic home exercise program at the time of discharge if you need assistance.  Some patients do not want to or do not have the ability to participate in an aquatic home program - this is not a barrier in any way to you participating in aquatic therapy as part of your current therapy plan! After Discharge from PT, you can continue using home program at  the [facility]/, there is a drop-in fee for $5 ($45 a month)or for 60 years  or older $4.00 ($40 a month for seniors ) or any local [facility] pool.  Memberships for purchase are  available for gym/pool at [facility]  IT IS VERY IMPORTANT THAT YOUR LAST VISIT BE IN THE CLINIC AT [address] AFTER YOUR LAST AQUATIC VISIT.  PLEASE MAKE SURE THAT YOU HAVE A [address]  APPOINTMENT SCHEDULED.   About the pool: Pool is located approximately 500 FT from the entrance of the building.  Please bring a support person if you need assistance traveling this      distance.   Your therapist will assist you in entering the water; there are two ways to           enter: stairs with railings, and a mechanical lift. Your therapist will determine the most appropriate way for you.  Water temperature is usually between 88-90 degrees  There may be up to 2 other swimmers in the pool at the same time  The pool deck is tile, please wear shoes with good traction if you prefer not to be barefoot.    Contact Info:  For appointment scheduling and cancellations:         Please call the [facility]  PH:[phone]              Aquatic Therapy  [facility]patient [facility]       All sessions are 45 minutes                                                    

## 2021-04-29 NOTE — Telephone Encounter (Signed)
Advised patient of her results. She state she does meet with a nutritionist. Larina Earthly, Helane Gunther, NP  P Cv Div Nl Triage Repeat lipid panel shows that her cholesterol is still elevated above goal (would like to see her total cholesterol <200, and LDL <100). Continue to work on lifestyle modification with diet and exercise and we can reassess need for any medication at follow-up visit. Thank you!

## 2021-04-30 ENCOUNTER — Ambulatory Visit (HOSPITAL_BASED_OUTPATIENT_CLINIC_OR_DEPARTMENT_OTHER): Payer: Medicare HMO | Admitting: Physical Therapy

## 2021-05-01 ENCOUNTER — Ambulatory Visit: Payer: Medicare HMO

## 2021-05-01 NOTE — Therapy (Incomplete)
OUTPATIENT PHYSICAL THERAPY TREATMENT NOTE   Patient Name: [name] MRN: [id number] DOB:Aug 28, 1965, 56 y.o., female Today's Date: 05/01/2021  PCP: Leeanne Rio, MD REFERRING PROVIDER: Leeanne Rio, MD    Past Medical History:  Diagnosis Date   Allergy    Anemia    Anxiety    Arthritis    Blood transfusion without reported diagnosis    Cough    Cough 06/11/2018   Difficulty sleeping 06/11/2018   Diverticulitis with perforation 2010   DVT (deep vein thrombosis) in pregnancy 11/24/2016   DVT, lower extremity ([place])    Endometrial cancer ([place])    Enlarged lymph nodes 08/13/2017   Family history of adverse reaction to anesthesia    pts daughter had difficulty awakening following anesthesia, long time to wake up   GERD (gastroesophageal reflux disease)    Headache    Hemoptysis 04/22/2018   History of bronchitis    History of ear infections    Hospital discharge follow-up 03/02/2019   Hx of migraines    Hyperlipidemia    Hypertension    IBS (irritable bowel syndrome)    Kidney infection    Lupus (systemic lupus erythematosus) ([place]) 02/2017   Morbid obesity ([place])    Neck pain 01/13/2019   Ovarian cancer ([place]) dx'd 01/2015   PONV (postoperative nausea and vomiting)    Port-A-Cath in place    Right side   Portacath in place 09/12/2015   Pre-diabetes    Pyelonephritis    Right knee pain 11/28/2011   Scleroderma ([place])    Screen for colon cancer 03/02/2019   Slow rate of speech 08/22/2015   Chronic from CVA.  She also has loss of left nasolabial fold and slight left facial droop/small asymmetry on the left side secondary to CVA   UTI (urinary tract infection) 05/07/2018   Past Surgical History:  Procedure Laterality Date   ABDOMINAL HYSTERECTOMY N/A 03/13/2015   Procedure: TOTAL HYSTERECTOMY ABDOMINAL BILATERAL SALPINGO OOPHORECTOMY RADICAL TUMOR [place];  Surgeon: Everitt Amber, MD;  Location: [facility];  Service: Gynecology;  Laterality: N/A;   BOWEL RESECTION   03/13/2015   Procedure: SMALL BOWEL RESECTION;  Surgeon: Everitt Amber, MD;  Location: [facility];  Service: Gynecology;;   [place] and 1984   COLONOSCOPY WITH PROPOFOL N/A 07/19/2020   Procedure: COLONOSCOPY WITH PROPOFOL;  Surgeon: Doran Stabler, MD;  [facility];  Service: Gastroenterology;  Laterality: N/A;   COLOSTOMY  2008   COLOSTOMY TAKEDOWN     ESOPHAGOGASTRODUODENOSCOPY (EGD) WITH PROPOFOL N/A 12/21/2018   Procedure: ESOPHAGOGASTRODUODENOSCOPY (EGD) WITH PROPOFOL;  Surgeon: Doran Stabler, MD;  [facility];  Service: Gastroenterology;  Laterality: N/A;   HEMOSTASIS CLIP PLACEMENT  07/19/2020   Procedure: HEMOSTASIS CLIP PLACEMENT;  Surgeon: Doran Stabler, MD;  Location: [facility];  Service: Gastroenterology;;   LAPAROTOMY N/A 03/13/2015   Procedure: EXPLORATORY LAPAROTOMY;  Surgeon: Everitt Amber, MD;  [facility];  Service: Gynecology;  Laterality: N/A;   POLYPECTOMY  07/19/2020   Procedure: POLYPECTOMY;  Surgeon: Doran Stabler, MD;  Location: [facility];  Service: Gastroenterology;;   TONSILLECTOMY AND [place]  03/13/2015   Procedure: HERNIA REPAIR VENTRAL ADULT;  Surgeon: Everitt Amber, MD;  [facility];  Service: Gynecology;;   Patient Active Problem List   Diagnosis Date Noted   Chronic diastolic CHF (congestive heart failure) ([place]) 07/19/2020   Bronchospasm 07/19/2020   Incontinence 03/17/2020  Herpes simplex virus (HSV) infection of buttock 01/05/2020   OSA (obstructive sleep apnea) 07/25/2019   Prediabetes 07/25/2019   Lateral epicondylitis of left elbow 01/13/2019   Normocytic anemia 05/29/2018   Scleroderma ([place]) 04/22/2018   Swelling of foot joint, right 04/08/2018   Positive ANA (antinuclear antibody) 09/23/2016   Eczema 01/27/2016   History of pulmonary embolism 09/12/2015   Long term current use of anticoagulant therapy 09/06/2015   Chemotherapy-induced peripheral  neuropathy ([place]) 06/13/2015   CKD (chronic kidney disease) stage 3, GFR 30-59 ml/min ([place]) 05/12/2015   GERD (gastroesophageal reflux disease) 05/12/2015   Ovarian carcinosarcoma, 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  PERTINENT HISTORY: Currently in remission from endometrial and ovarian cancer   PRECAUTIONS: none   SUBJECTIVE: ***  PAIN:  Are you having pain? {yes/no:20286} NPRS scale: ***/10 Pain location: *** Pain orientation: {Pain Orientation:25161}  PAIN TYPE: {type:313116} Pain description: {PAIN DESCRIPTION:21022940}  Aggravating factors: *** Relieving factors: ***   OBJECTIVE:   *Unless otherwise noted all objective measures were captured on initial evaluation.   DIAGNOSTIC FINDINGS:  N/A   PATIENT SURVEYS:  FOTO 36% function to 56% predicted    SCREENING FOR RED FLAGS: Bowel or bladder incontinence: No Cauda equina syndrome: No   COGNITION:          Overall cognitive status: Within functional limits for tasks assessed                        SENSATION:          Light touch: Appears intact             PALPATION: Tautness and palpable Rt thoracolumbar paraspinals.  PAIVM unable to accurately assess due to body habitus    LUMBARAROM/PROM   A/PROM A/PROM  04/29/2021  Flexion Full; increased pain low back  Extension Full; decreases pain   Right lateral flexion 25% limited   Left lateral flexion 50% limited, pulling Rt side low back  Right rotation 25% limited   Left rotation 50% limited pulling Rt side low back    (Blank rows = not tested)     LE MMT:   MMT Right 04/29/2021 Left 04/29/2021  Hip flexion 4;  pn low back  4+  Hip extension      Hip abduction 3+; pn low back  3+  Hip adduction      Hip internal rotation      Hip external rotation      Knee flexion      Knee extension      Ankle dorsiflexion      Ankle plantarflexion      Ankle inversion      Ankle eversion       (Blank rows = not tested)   LUMBAR SPECIAL TESTS:  (+) SLR RLE    FUNCTIONAL TESTS:  5 times sit to stand: 15 seconds, increased low back pain   GAIT: Distance walked: 10 ft  Assistive device utilized: Single point cane Level of assistance: Modified independence Comments: WBOS, lateral trunk lean       TODAY'S TREATMENT   [facility] Adult PT  Treatment:                                                DATE: *** Therapeutic Exercise: *** Manual Therapy: *** Neuromuscular re-ed: *** Therapeutic Activity: *** Modalities: *** Self Care: [facility] Adult PT Treatment:                                                DATE: 04/29/21 Therapeutic Exercise: Demonstrated and issued initial HEP   Therapeutic Activity: Education on assessment findings that will be addressed throughout duration of POC     PATIENT EDUCATION:  Education details: see treatment above  Person educated: Patient Education method: Explanation, Demonstration, Tactile cues, Verbal cues, and Handouts Education comprehension: verbalized understanding, returned demonstration, verbal cues required, tactile cues required, and needs further education     HOME EXERCISE PROGRAM: Access Code: [email or web address] URL: [email or web address] Date: 04/29/2021 Prepared by: Gwendolyn Grant   Exercises Supine Lower Trunk Rotation - 2 x daily - 7 x weekly - 2 sets - 10 reps Supine Pelvic Tilt - 2 x daily - 7 x weekly - 2 sets - 10 reps Supine Figure 4 Piriformis Stretch - 2 x daily - 7 x weekly - 3 sets - 30 hold     ASSESSMENT:   CLINICAL IMPRESSION:    REHAB POTENTIAL: Good   CLINICAL DECISION MAKING: Stable/uncomplicated   EVALUATION  COMPLEXITY: Low     GOALS: Goals reviewed with patient? No       LONG TERM GOALS:    LTG Name Target Date Goal status  1 Patient will demonstrate pain free lumbar AROM to improve ability to complete bending and lifting activity.  Baseline:see objective  06/10/2021 INITIAL  2 Patient will complete 5 x STS in < 12 seconds without increased back pain to signify improvements in functional strength.  Baseline: see objective 06/10/2021 INITIAL  3 Patient will tolerate at least 20 minutes of standing activity to improve tolerance to cleaning and grocery shopping Baseline: reports frequent rest breaks with cleaning at home and can only tolerate walking 1-2 aisles at grocery store currently.  06/10/2021 INITIAL  4 Patient will be independent with advanced home program to assist in management of her chronic condition.  Baseline: initial HEP issued  06/10/2021 INITIAL  5 Patient will score 56% function on FOTO to signify clinically meaningful improvement in functional abilities.  06/10/21 Initial     PLAN: PT FREQUENCY: 2x/week   PT DURATION: 6 weeks   PLANNED INTERVENTIONS: Therapeutic exercises, Therapeutic activity, Neuro Muscular re-education, Balance training, Gait training, Patient/Family education, Stair training, Aquatic Therapy, Dry Needling, Cryotherapy, Moist heat, and Manual therapy   PLAN FOR NEXT SESSION: review HEP, review FOTO, manual to L-spine, spinal mobility, hip/core strength

## 2021-05-06 ENCOUNTER — Other Ambulatory Visit: Payer: Self-pay

## 2021-05-06 ENCOUNTER — Ambulatory Visit
Admission: RE | Admit: 2021-05-06 | Discharge: 2021-05-06 | Disposition: A | Discharge status: Home / Self Care | Payer: Medicare HMO | Source: Ambulatory Visit | Attending: Family Medicine | Admitting: Family Medicine

## 2021-05-06 ENCOUNTER — Ambulatory Visit: Payer: Medicare HMO

## 2021-05-06 DIAGNOSIS — Z1231 Encounter for screening mammogram for malignant neoplasm of breast: Secondary | ICD-10-CM

## 2021-05-07 ENCOUNTER — Other Ambulatory Visit: Payer: Self-pay | Admitting: Family Medicine

## 2021-05-08 ENCOUNTER — Other Ambulatory Visit: Payer: Self-pay

## 2021-05-08 ENCOUNTER — Ambulatory Visit: Payer: Medicare HMO | Attending: Family Medicine

## 2021-05-08 DIAGNOSIS — G8929 Other chronic pain: Secondary | ICD-10-CM | POA: Diagnosis present

## 2021-05-08 DIAGNOSIS — M545 Low back pain, unspecified: Secondary | ICD-10-CM | POA: Insufficient documentation

## 2021-05-08 DIAGNOSIS — M546 Pain in thoracic spine: Secondary | ICD-10-CM | POA: Diagnosis present

## 2021-05-08 NOTE — Therapy (Signed)
OUTPATIENT PHYSICAL THERAPY TREATMENT NOTE   Patient Name: [name] MRN: [id number] DOB:01/13/1966, 56 y.o., female Today's Date: 05/08/2021  PCP: Leeanne Rio, MD REFERRING PROVIDER: Rise Patience, DO   PT End of Session - 05/08/21 [phone]     Visit Number 2    Number of Visits 13    Date for PT Re-Evaluation 06/15/21    Authorization Type [employer] primary; [employer] secondary- requesting auth    PT Start Time 1000    PT Stop Time 1038    PT Time Calculation (min) 38 min    Activity Tolerance Patient tolerated treatment well    Behavior During Therapy WFL for tasks assessed/performed             Past Medical History:  Diagnosis Date   Allergy    Anemia    Anxiety    Arthritis    Blood transfusion without reported diagnosis    Cough    Cough 06/11/2018   Difficulty sleeping 06/11/2018   Diverticulitis with perforation 2010   DVT (deep vein thrombosis) in pregnancy 11/24/2016   DVT, lower extremity ([place])    Endometrial cancer ([place])    Enlarged lymph nodes 08/13/2017   Family history of adverse reaction to anesthesia    pts daughter had difficulty awakening following anesthesia, long time to wake up   GERD (gastroesophageal reflux disease)    Headache    Hemoptysis 04/22/2018   History of bronchitis    History of ear infections    Hospital discharge follow-up 03/02/2019   Hx of migraines    Hyperlipidemia    Hypertension    IBS (irritable bowel syndrome)    Kidney infection    Lupus (systemic lupus erythematosus) ([place]) 02/2017   Morbid obesity ([place])    Neck pain 01/13/2019   Ovarian cancer ([place]) dx'd 01/2015   PONV (postoperative nausea and vomiting)    Port-A-Cath in place    Right side   Portacath in place 09/12/2015   Pre-diabetes    Pyelonephritis    Right knee pain 11/28/2011   Scleroderma ([place])    Screen for colon cancer 03/02/2019   Slow rate of speech 08/22/2015   Chronic from CVA.  She also has loss of left nasolabial fold and slight left facial  droop/small asymmetry on the left side secondary to CVA   UTI (urinary tract infection) 05/07/2018   Past Surgical History:  Procedure Laterality Date   ABDOMINAL HYSTERECTOMY N/A 03/13/2015   Procedure: TOTAL HYSTERECTOMY ABDOMINAL BILATERAL SALPINGO OOPHORECTOMY RADICAL TUMOR [place];  Surgeon: Everitt Amber, MD;  [facility];  Service: Gynecology;  Laterality: N/A;   BOWEL RESECTION  03/13/2015   Procedure: SMALL BOWEL RESECTION;  Surgeon: Everitt Amber, MD;  [facility]: [facility];  Service: Gynecology;;   [place] and 1984   COLONOSCOPY WITH PROPOFOL N/A 07/19/2020   Procedure: COLONOSCOPY WITH PROPOFOL;  Surgeon: Doran Stabler, MD;  [facility];  Service: Gastroenterology;  Laterality: N/A;   COLOSTOMY  2008   COLOSTOMY TAKEDOWN     ESOPHAGOGASTRODUODENOSCOPY (EGD) WITH PROPOFOL N/A 12/21/2018   Procedure: ESOPHAGOGASTRODUODENOSCOPY (EGD) WITH PROPOFOL;  Surgeon: Doran Stabler, MD;  [facility];  Service: Gastroenterology;  Laterality: N/A;   HEMOSTASIS CLIP PLACEMENT  07/19/2020   Procedure: HEMOSTASIS CLIP PLACEMENT;  Surgeon: Doran Stabler, MD;  Location: [facility];  Service: Gastroenterology;;   LAPAROTOMY N/A 03/13/2015   Procedure: EXPLORATORY LAPAROTOMY;  Surgeon: Everitt Amber, MD;  [facility];  Service: Gynecology;  Laterality: N/A;   POLYPECTOMY  07/19/2020   Procedure: POLYPECTOMY;  Surgeon: Doran Stabler, MD;  Location: [facility];  Service: Gastroenterology;;   TONSILLECTOMY AND [place]  03/13/2015   Procedure: HERNIA REPAIR VENTRAL ADULT;  Surgeon: Everitt Amber, MD;  [facility];  Service: Gynecology;;   Patient Active Problem List   Diagnosis Date Noted   Chronic diastolic CHF (congestive heart failure) ([place]) 07/19/2020   Bronchospasm 07/19/2020   Incontinence 03/17/2020   Herpes simplex virus (HSV) infection of buttock 01/05/2020   OSA (obstructive sleep apnea) 07/25/2019    Prediabetes 07/25/2019   Lateral epicondylitis of left elbow 01/13/2019   Normocytic anemia 05/29/2018   Scleroderma ([place]) 04/22/2018   Swelling of foot joint, right 04/08/2018   Positive ANA (antinuclear antibody) 09/23/2016   Eczema 01/27/2016   History of pulmonary embolism 09/12/2015   Long term current use of anticoagulant therapy 09/06/2015   Chemotherapy-induced peripheral neuropathy ([place]) 06/13/2015   CKD (chronic kidney disease) stage 3, GFR 30-59 ml/min ([facility]) 05/12/2015   GERD (gastroesophageal reflux disease) 05/12/2015   Ovarian carcinosarcoma, 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/2015   Endometrial ca ([place]) 03/29/2015   Ventral hernia without obstruction or gangrene    Morbid obesity ([place]) 02/19/2015   Essential hypertension, benign    Stress at home 12/14/2014   Left knee pain 08/04/2013   Seasonal allergies 07/29/2013    REFERRING DIAG:  M54.6 (ICD-10-CM) - Acute right-sided thoracic back pain   THERAPY DIAG:  Chronic bilateral low back pain without sciatica  Pain in thoracic spine  PERTINENT HISTORY:  Currently in remission from endometrial and ovarian cancer   PRECAUTIONS: None  SUBJECTIVE:  Pt presents to PT with reports of mid and lower back pain and discomfort. She has been compliant with HEP with no adverse effect. She is ready to begin PT at this time.   PAIN:  Are you having pain? Yes NPRS scale: 6/10 Pain location: mid and lower back  PAIN TYPE: aching Pain description: intermittent  Aggravating factors: pulling, lifting, reaching, prolonged standing Relieving factors: rest    OBJECTIVE:    DIAGNOSTIC FINDINGS:  N/A   PATIENT SURVEYS:  FOTO 36% function to 56% predicted    SCREENING FOR RED FLAGS: Bowel or bladder incontinence: No Cauda equina syndrome: No   COGNITION:          Overall cognitive status: Within functional limits for tasks assessed                        SENSATION:          Light touch: Appears intact              PALPATION: Tautness and palpable Rt thoracolumbar paraspinals.  PAIVM unable to accurately assess due to body habitus    LUMBARAROM/PROM   A/PROM A/PROM  04/29/2021  Flexion Full; increased pain low back  Extension Full; decreases pain   Right lateral flexion 25% limited   Left lateral flexion 50% limited, pulling Rt side low back  Right rotation 25% limited   Left rotation 50% limited pulling Rt side low back    (Blank rows = not tested)     LE MMT:   MMT Right 04/29/2021 Left 04/29/2021  Hip flexion 4; pn low back  4+  Hip extension      Hip abduction 3+; pn low back  3+  Hip adduction  Hip internal rotation      Hip external rotation      Knee flexion      Knee extension      Ankle dorsiflexion      Ankle plantarflexion      Ankle inversion      Ankle eversion       (Blank rows = not tested)   LUMBAR SPECIAL TESTS:  (+) SLR RLE    FUNCTIONAL TESTS:  5 times sit to stand: 15 seconds, increased low back pain   GAIT: Distance walked: 10 ft  Assistive device utilized: Single point cane Level of assistance: Modified independence Comments: WBOS, lateral trunk lean       TODAY'S TREATMENT  [facility] Adult PT Treatment:                                                DATE: 05/08/2021 Therapeutic Exercise: NuStep lvl 5 UE/LE x 5 min while taking subjective Seated row 3x10 GTB Seated horizontal abd 2x10 RTB LTR x 10 ea Supine PPT x 10  Supine PPT w/ ball squeeze 2x10 Supine clamshell 3x15 BTB Supine march 2x20 BTB Supine SLR 2x10 (small range) Seated fwd pball rollout 2x10 Green Standing hip abd/ext 2x10 ea  [facility] Adult PT Treatment:                                                DATE: 04/29/2021 Therapeutic Exercise: Demonstrated and issued initial HEP   Therapeutic Activity: Education on assessment findings that will be addressed throughout duration of POC     PATIENT EDUCATION:  Education details: see treatment above  Person educated: Patient Education  method: Explanation, Demonstration, Tactile cues, Verbal cues, and Handouts Education comprehension: verbalized understanding, returned demonstration, verbal cues required, tactile cues required, and needs further education     HOME EXERCISE PROGRAM: Access Code: [email or web address] URL: [email or web address] Date: 04/29/2021 Prepared by: Gwendolyn Grant   Exercises Supine Lower Trunk Rotation - 2 x daily - 7 x weekly - 2 sets - 10 reps Supine Pelvic Tilt - 2 x daily - 7 x weekly - 2 sets - 10 reps Supine Figure 4 Piriformis Stretch - 2 x daily - 7 x weekly - 3 sets - 30 hold Standing Shoulder Row with Anchored Resistance - 1 x daily - 7 x weekly - 3 sets - 10 reps    ASSESSMENT:   CLINICAL IMPRESSION: Pt was able to complete all prescribed exercises with no adverse effect. Therapy today focused on increasing periscapular, core, and proximal hip strength in order to decrease pain and improve mobility. Pt continues to have significant pain and weakness that limits safe community mobility and continues to benefit from skilled PT services. PT will continue to progress strengthening exercises as tolerated, alternating gym and aquatic sessions.    REHAB POTENTIAL: Good   CLINICAL DECISION MAKING: Stable/uncomplicated   EVALUATION COMPLEXITY: Low     GOALS: Goals reviewed with patient? No       LONG TERM GOALS:    LTG Name Target Date Goal status  1 Patient will demonstrate pain free lumbar AROM to improve ability to complete bending and lifting activity.  Baseline:see objective  06/10/2021 INITIAL  2 Patient will complete 5  x STS in < 12 seconds without increased back pain to signify improvements in functional strength.  Baseline: see objective 06/10/2021 INITIAL  3 Patient will tolerate at least 20 minutes of standing activity to improve tolerance to cleaning and grocery shopping Baseline: reports frequent rest breaks with cleaning at home and can only tolerate walking 1-2 aisles at  grocery store currently.  06/10/2021 INITIAL  4 Patient will be independent with advanced home program to assist in management of her chronic condition.  Baseline: initial HEP issued  06/10/2021 INITIAL  5 Patient will score 56% function on FOTO to signify clinically meaningful improvement in functional abilities.  06/10/21 Initial     PLAN: PT FREQUENCY: 2x/week   PT DURATION: 6 weeks   PLANNED INTERVENTIONS: Therapeutic exercises, Therapeutic activity, Neuro Muscular re-education, Balance training, Gait training, Patient/Family education, Stair training, Aquatic Therapy, Dry Needling, Cryotherapy, Moist heat, and Manual therapy   PLAN FOR NEXT SESSION: review HEP, review FOTO, manual to L-spine, spinal mobility, hip/core strength    Ward Chatters, PT 05/08/2021, 10:41 AM

## 2021-05-15 ENCOUNTER — Ambulatory Visit: Payer: Medicare HMO

## 2021-05-15 ENCOUNTER — Other Ambulatory Visit: Payer: Self-pay

## 2021-05-15 DIAGNOSIS — M546 Pain in thoracic spine: Secondary | ICD-10-CM

## 2021-05-15 DIAGNOSIS — G8929 Other chronic pain: Secondary | ICD-10-CM

## 2021-05-15 DIAGNOSIS — M545 Low back pain, unspecified: Secondary | ICD-10-CM | POA: Diagnosis not present

## 2021-05-15 NOTE — Therapy (Signed)
OUTPATIENT PHYSICAL THERAPY TREATMENT NOTE   Patient Name: [name] MRN: [id number] DOB:[birth year], 56 y.o., female Today's Date: 05/15/2021  PCP: Leeanne Rio, MD REFERRING PROVIDER: Rise Patience, DO   PT End of Session - 05/15/21 0915     Visit Number 3    Number of Visits 13    Date for PT Re-Evaluation 06/15/21    Authorization Type [employer] primary; MCD secondary- requesting auth    PT Start Time 0915    PT Stop Time 0955    PT Time Calculation (min) 40 min    Activity Tolerance Patient tolerated treatment well    Behavior During Therapy [facility] for tasks assessed/performed              Past Medical History:  Diagnosis Date   Allergy    Anemia    Anxiety    Arthritis    Blood transfusion without reported diagnosis    Cough    Cough 06/11/2018   Difficulty sleeping 06/11/2018   Diverticulitis with perforation 2010   DVT (deep vein thrombosis) in pregnancy 11/24/2016   DVT, lower extremity ([place])    Endometrial cancer ([place])    Enlarged lymph nodes 08/13/2017   Family history of adverse reaction to anesthesia    pts daughter had difficulty awakening following anesthesia, long time to wake up   GERD (gastroesophageal reflux disease)    Headache    Hemoptysis 04/22/2018   History of bronchitis    History of ear infections    Hospital discharge follow-up 03/02/2019   Hx of migraines    Hyperlipidemia    Hypertension    IBS (irritable bowel syndrome)    Kidney infection    Lupus (systemic lupus erythematosus) ([place]) 02/2017   Morbid obesity ([place])    Neck pain 01/13/2019   Ovarian cancer ([place]) dx'd 01/2015   PONV (postoperative nausea and vomiting)    Port-A-Cath in place    Right side   Portacath in place 09/12/2015   Pre-diabetes    Pyelonephritis    Right knee pain 11/28/2011   Scleroderma ([place])    Screen for colon cancer 03/02/2019   Slow rate of speech 08/22/2015   Chronic from CVA.  She also has loss of left nasolabial fold and slight left  facial droop/small asymmetry on the left side secondary to CVA   UTI (urinary tract infection) 05/07/2018   Past Surgical History:  Procedure Laterality Date   ABDOMINAL HYSTERECTOMY N/A 03/13/2015   Procedure: TOTAL HYSTERECTOMY ABDOMINAL BILATERAL SALPINGO OOPHORECTOMY RADICAL TUMOR [place];  Surgeon: Everitt Amber, MD;  [facility]: [facility];  Service: Gynecology;  Laterality: N/A;   BOWEL RESECTION  03/13/2015   Procedure: SMALL BOWEL RESECTION;  Surgeon: Everitt Amber, MD;  [facility]: [facility];  Service: Gynecology;;   [place] and 1984   COLONOSCOPY WITH PROPOFOL N/A 07/19/2020   Procedure: COLONOSCOPY WITH PROPOFOL;  Surgeon: Doran Stabler, MD;  [facility];  Service: Gastroenterology;  Laterality: N/A;   COLOSTOMY  2008   COLOSTOMY TAKEDOWN     ESOPHAGOGASTRODUODENOSCOPY (EGD) WITH PROPOFOL N/A 12/21/2018   Procedure: ESOPHAGOGASTRODUODENOSCOPY (EGD) WITH PROPOFOL;  Surgeon: Doran Stabler, MD;  [facility];  Service: Gastroenterology;  Laterality: N/A;   HEMOSTASIS CLIP PLACEMENT  07/19/2020   Procedure: HEMOSTASIS CLIP PLACEMENT;  Surgeon: Doran Stabler, MD;  Location: [facility];  Service: Gastroenterology;;   LAPAROTOMY N/A 03/13/2015   Procedure: EXPLORATORY LAPAROTOMY;  Surgeon: Everitt Amber, MD;  Location: [facility];  Service: Gynecology;  Laterality: N/A;   POLYPECTOMY  07/19/2020   Procedure: POLYPECTOMY;  Surgeon: Doran Stabler, MD;  Location: [facility];  Service: Gastroenterology;;   TONSILLECTOMY AND [place]  03/13/2015   Procedure: HERNIA REPAIR VENTRAL ADULT;  Surgeon: Everitt Amber, MD;  [facility];  Service: Gynecology;;   Patient Active Problem List   Diagnosis Date Noted   Chronic diastolic CHF (congestive heart failure) ([place]) 07/19/2020   Bronchospasm 07/19/2020   Incontinence 03/17/2020   Herpes simplex virus (HSV) infection of buttock 01/05/2020   OSA (obstructive sleep apnea)  07/25/2019   Prediabetes 07/25/2019   Lateral epicondylitis of left elbow 01/13/2019   Normocytic anemia 05/29/2018   Scleroderma ([place]) 04/22/2018   Swelling of foot joint, right 04/08/2018   Positive ANA (antinuclear antibody) 09/23/2016   Eczema 01/27/2016   History of pulmonary embolism 09/12/2015   Long term current use of anticoagulant therapy 09/06/2015   Chemotherapy-induced peripheral neuropathy ([place]) 06/13/2015   CKD (chronic kidney disease) stage 3, GFR 30-59 ml/min (HCC) 05/12/2015   GERD (gastroesophageal reflux disease) 05/12/2015   Ovarian carcinosarcoma, right ([place]) 03/29/2015   Endometrial ca ([place]) 03/29/2015   Ventral hernia without obstruction or gangrene    Morbid obesity ([place]) 02/19/2015   Essential hypertension, benign    Stress at home 12/14/2014   Left knee pain 08/04/2013   Seasonal allergies 07/29/2013    REFERRING DIAG:  M54.6 (ICD-10-CM) - Acute right-sided thoracic back pain   THERAPY DIAG:  Chronic bilateral low back pain without sciatica  Pain in thoracic spine  PERTINENT HISTORY:  Currently in remission from endometrial and ovarian cancer   PRECAUTIONS: None  SUBJECTIVE:  Pt presents to PT with continued reports of mid and lower back pain. Has tried to be compliant with her HEP but is frustrated that she has continued pain during daily activities. She is ready to begin PT at this time.   PAIN:  Are you having pain? Yes NPRS scale: 6/10 Pain location: mid and lower back  PAIN TYPE: aching Pain description: intermittent  Aggravating factors: pulling, lifting, reaching, prolonged standing Relieving factors: rest    OBJECTIVE:    DIAGNOSTIC FINDINGS:  N/A   PATIENT SURVEYS:  FOTO 36% function to 56% predicted    SCREENING FOR RED FLAGS: Bowel or bladder incontinence: No Cauda equina syndrome: No   COGNITION:          Overall cognitive status: Within functional limits for tasks assessed                        SENSATION:           Light touch: Appears intact             PALPATION: Tautness and palpable Rt thoracolumbar paraspinals.  PAIVM unable to accurately assess due to body habitus    LUMBARAROM/PROM   A/PROM A/PROM  04/29/2021  Flexion Full; increased pain low back  Extension Full; decreases pain   Right lateral flexion 25% limited   Left lateral flexion 50% limited, pulling Rt side low back  Right rotation 25% limited   Left rotation 50% limited pulling Rt side low back    (Blank rows = not tested)     LE MMT:   MMT Right 04/29/2021 Left 04/29/2021  Hip flexion 4; pn low back  4+  Hip extension      Hip abduction 3+; pn low back  3+  Hip adduction      Hip internal rotation      Hip external rotation      Knee flexion      Knee extension      Ankle dorsiflexion      Ankle plantarflexion      Ankle inversion      Ankle eversion       (Blank rows = not tested)   LUMBAR SPECIAL TESTS:  (+) SLR RLE    FUNCTIONAL TESTS:  5 times sit to stand: 15 seconds, increased low back pain   GAIT: Distance walked: 10 ft  Assistive device utilized: Single point cane Level of assistance: Modified independence Comments: WBOS, lateral trunk lean       TODAY'S TREATMENT  [facility] Adult PT Treatment:                                                DATE: 05/15/2021 Therapeutic Exercise: NuStep lvl 5 UE/LE x 5 min while taking subjective Seated row 3x12 BTB Seated horizontal abd 2x15 RTB Seated march 2x20 4# LAQ 2x15 4# LTR x 10 ea Supine PPT x 10  Supine PPT w/ ball squeeze 2x10 Supine bent knee fallout 3x20 BTB each Supine SLR 2x10 (small range with pulse) Supine "hundreds" 2x20" Seated fwd pball rollout 2x10 - Blue pball Standing hip abd 2x10 ea  [facility] Adult PT Treatment:                                                DATE: 05/08/2021 Therapeutic Exercise: NuStep lvl 5 UE/LE x 5 min while taking subjective Seated row 3x10 GTB Seated horizontal abd 2x10 RTB LTR x 10 ea Supine PPT x 10  Supine  PPT w/ ball squeeze 2x10 Supine clamshell 3x15 BTB Supine march 2x20 BTB Supine SLR 2x10 (small range) Seated fwd pball rollout 2x10 Green Standing hip abd/ext 2x10 ea  [facility] Adult PT Treatment:                                                DATE: 04/29/2021 Therapeutic Exercise: Demonstrated and issued initial HEP   Therapeutic Activity: Education on assessment findings that will be addressed throughout duration of POC     PATIENT EDUCATION:  Education details: see treatment above  Person educated: Patient Education method: Explanation, Demonstration, Tactile cues, Verbal cues, and Handouts Education comprehension: verbalized understanding, returned demonstration, verbal cues required, tactile cues required, and needs further education     HOME EXERCISE PROGRAM: Access Code: [email or web address] URL: [email or web address] Date: 05/15/2021 Prepared by: Octavio Manns  Exercises Supine Lower Trunk Rotation - 2 x daily - 7 x weekly - 2 sets - 10 reps Supine Pelvic Tilt - 2 x daily - 7 x weekly - 2 sets - 10 reps Supine Figure 4 Piriformis Stretch - 2 x daily - 7 x weekly - 3 sets - 30 hold Standing Shoulder Row with Anchored Resistance - 1 x daily - 7 x weekly - 3 sets - 10 reps Hooklying Single Leg Bent Knee Fallouts with Resistance - 1 x daily - 7  x weekly - 3 sets - 20 reps - blue exercise band hold   ASSESSMENT:   CLINICAL IMPRESSION: Pt was able to complete prescribed exercises with no adverse effect. Therapy today focused on improving core and proximal hip strength in order to decrease pain and improve functional mobility. HEP updated for continued hooklying hip strengthening. Will continue to progress as able per POC.      GOALS: Goals reviewed with patient? No       LONG TERM GOALS:    LTG Name Target Date Goal status  1 Patient will demonstrate pain free lumbar AROM to improve ability to complete bending and lifting activity.  Baseline:see objective  06/10/2021  INITIAL  2 Patient will complete 5 x STS in < 12 seconds without increased back pain to signify improvements in functional strength.  Baseline: see objective 06/10/2021 INITIAL  3 Patient will tolerate at least 20 minutes of standing activity to improve tolerance to cleaning and grocery shopping Baseline: reports frequent rest breaks with cleaning at home and can only tolerate walking 1-2 aisles at grocery store currently.  06/10/2021 INITIAL  4 Patient will be independent with advanced home program to assist in management of her chronic condition.  Baseline: initial HEP issued  06/10/2021 INITIAL  5 Patient will score 56% function on FOTO to signify clinically meaningful improvement in functional abilities.  06/10/21 INITIAL    PLAN: PT FREQUENCY: 2x/week   PT DURATION: 6 weeks   PLANNED INTERVENTIONS: Therapeutic exercises, Therapeutic activity, Neuro Muscular re-education, Balance training, Gait training, Patient/Family education, Stair training, Aquatic Therapy, Dry Needling, Cryotherapy, Moist heat, and Manual therapy   PLAN FOR NEXT SESSION: review HEP, review FOTO, manual to L-spine, spinal mobility, hip/core strength    Ward Chatters, PT 05/15/2021, 10:01 AM

## 2021-05-21 ENCOUNTER — Ambulatory Visit: Payer: Medicare HMO

## 2021-05-21 ENCOUNTER — Other Ambulatory Visit: Payer: Self-pay

## 2021-05-21 DIAGNOSIS — M546 Pain in thoracic spine: Secondary | ICD-10-CM

## 2021-05-21 DIAGNOSIS — G8929 Other chronic pain: Secondary | ICD-10-CM

## 2021-05-21 DIAGNOSIS — M545 Low back pain, unspecified: Secondary | ICD-10-CM

## 2021-05-21 NOTE — Therapy (Signed)
OUTPATIENT PHYSICAL THERAPY TREATMENT NOTE   Patient Name: [name] MRN: [id number] DOB:10-Jan-1966, 56 y.o., female Today's Date: 05/21/2021  PCP: Leeanne Rio, MD REFERRING PROVIDER: Leeanne Rio, MD   PT End of Session - 05/21/21 0944     Visit Number 4    Number of Visits 13    Date for PT Re-Evaluation 06/15/21    Authorization Type [employer] primary; MCD secondary- requesting auth    PT Start Time 0915    PT Stop Time 0955    PT Time Calculation (min) 40 min    Activity Tolerance Patient tolerated treatment well    Behavior During Therapy [facility] for tasks assessed/performed               Past Medical History:  Diagnosis Date   Allergy    Anemia    Anxiety    Arthritis    Blood transfusion without reported diagnosis    Cough    Cough 06/11/2018   Difficulty sleeping 06/11/2018   Diverticulitis with perforation 2010   DVT (deep vein thrombosis) in pregnancy 11/24/2016   DVT, lower extremity ([place])    Endometrial cancer ([place])    Enlarged lymph nodes 08/13/2017   Family history of adverse reaction to anesthesia    pts daughter had difficulty awakening following anesthesia, long time to wake up   GERD (gastroesophageal reflux disease)    Headache    Hemoptysis 04/22/2018   History of bronchitis    History of ear infections    Hospital discharge follow-up 03/02/2019   Hx of migraines    Hyperlipidemia    Hypertension    IBS (irritable bowel syndrome)    Kidney infection    Lupus (systemic lupus erythematosus) ([place]) 02/2017   Morbid obesity ([place])    Neck pain 01/13/2019   Ovarian cancer ([place]) dx'd 01/2015   PONV (postoperative nausea and vomiting)    Port-A-Cath in place    Right side   Portacath in place 09/12/2015   Pre-diabetes    Pyelonephritis    Right knee pain 11/28/2011   Scleroderma ([place])    Screen for colon cancer 03/02/2019   Slow rate of speech 08/22/2015   Chronic from CVA.  She also has loss of left nasolabial fold and slight  left facial droop/small asymmetry on the left side secondary to CVA   UTI (urinary tract infection) 05/07/2018   Past Surgical History:  Procedure Laterality Date   ABDOMINAL HYSTERECTOMY N/A 03/13/2015   Procedure: TOTAL HYSTERECTOMY ABDOMINAL BILATERAL SALPINGO OOPHORECTOMY RADICAL TUMOR [place];  Surgeon: Everitt Amber, MD;  [facility];  Service: Gynecology;  Laterality: N/A;   BOWEL RESECTION  03/13/2015   Procedure: SMALL BOWEL RESECTION;  Surgeon: Everitt Amber, MD;  [facility];  Service: Gynecology;;   [place] and 1984   COLONOSCOPY WITH PROPOFOL N/A 07/19/2020   Procedure: COLONOSCOPY WITH PROPOFOL;  Surgeon: Doran Stabler, MD;  [facility];  Service: Gastroenterology;  Laterality: N/A;   COLOSTOMY  2008   COLOSTOMY TAKEDOWN     ESOPHAGOGASTRODUODENOSCOPY (EGD) WITH PROPOFOL N/A 12/21/2018   Procedure: ESOPHAGOGASTRODUODENOSCOPY (EGD) WITH PROPOFOL;  Surgeon: Doran Stabler, MD;  [facility];  Service: Gastroenterology;  Laterality: N/A;   HEMOSTASIS CLIP PLACEMENT  07/19/2020   Procedure: HEMOSTASIS CLIP PLACEMENT;  Surgeon: Doran Stabler, MD;  Location: [facility];  Service: Gastroenterology;;   LAPAROTOMY N/A 03/13/2015   Procedure: EXPLORATORY LAPAROTOMY;  Surgeon: Everitt Amber, MD;  [facility];  Service: Gynecology;  Laterality: N/A;   POLYPECTOMY  07/19/2020   Procedure: POLYPECTOMY;  Surgeon: Doran Stabler, MD;  Location: [facility];  Service: Gastroenterology;;   TONSILLECTOMY AND [place]  03/13/2015   Procedure: HERNIA REPAIR VENTRAL ADULT;  Surgeon: Everitt Amber, MD;  [facility];  Service: Gynecology;;   Patient Active Problem List   Diagnosis Date Noted   Chronic diastolic CHF (congestive heart failure) ([place]) 07/19/2020   Bronchospasm 07/19/2020   Incontinence 03/17/2020   Herpes simplex virus (HSV) infection of buttock 01/05/2020   OSA (obstructive sleep apnea)  07/25/2019   Prediabetes 07/25/2019   Lateral epicondylitis of left elbow 01/13/2019   Normocytic anemia 05/29/2018   Scleroderma ([place]) 04/22/2018   Swelling of foot joint, right 04/08/2018   Positive ANA (antinuclear antibody) 09/23/2016   Eczema 01/27/2016   History of pulmonary embolism 09/12/2015   Long term current use of anticoagulant therapy 09/06/2015   Chemotherapy-induced peripheral neuropathy ([place]) 06/13/2015   CKD (chronic kidney disease) stage 3, GFR 30-59 ml/min (HCC) 05/12/2015   GERD (gastroesophageal reflux disease) 05/12/2015   Ovarian carcinosarcoma, right ([place]) 03/29/2015   Endometrial ca ([place]) 03/29/2015   Ventral hernia without obstruction or gangrene    Morbid obesity ([place]) 02/19/2015   Essential hypertension, benign    Stress at home 12/14/2014   Left knee pain 08/04/2013   Seasonal allergies 07/29/2013    REFERRING DIAG:  M54.6 (ICD-10-CM) - Acute right-sided thoracic back pain   THERAPY DIAG:  Chronic bilateral low back pain without sciatica  Pain in thoracic spine  PERTINENT HISTORY:  Currently in remission from endometrial and ovarian cancer   PRECAUTIONS: None  SUBJECTIVE:  Pt presents to PT with continued back pain, but notes that it is getting a little better. She has continued to be compliant with HEP with no adverse effect. Pt is ready to begin PT at this time.   PAIN:  Are you having pain? Yes NPRS scale: 5/10 Pain location: mid and lower back  PAIN TYPE: aching Pain description: intermittent  Aggravating factors: pulling, lifting, reaching, prolonged standing Relieving factors: rest    OBJECTIVE:    DIAGNOSTIC FINDINGS:  N/A   PATIENT SURVEYS:  FOTO 36% function to 56% predicted    SCREENING FOR RED FLAGS: Bowel or bladder incontinence: No Cauda equina syndrome: No   COGNITION:          Overall cognitive status: Within functional limits for tasks assessed                        SENSATION:          Light touch: Appears  intact             PALPATION: Tautness and palpable Rt thoracolumbar paraspinals.  PAIVM unable to accurately assess due to body habitus    LUMBARAROM/PROM   A/PROM A/PROM  04/29/2021  Flexion Full; increased pain low back  Extension Full; decreases pain   Right lateral flexion 25% limited   Left lateral flexion 50% limited, pulling Rt side low back  Right rotation 25% limited   Left rotation 50% limited pulling Rt side low back    (Blank rows = not tested)     LE MMT:   MMT Right 04/29/2021 Left 04/29/2021  Hip flexion 4; pn low back  4+  Hip extension      Hip abduction 3+; pn low back  3+  Hip adduction      Hip internal rotation      Hip external rotation      Knee flexion      Knee extension      Ankle dorsiflexion      Ankle plantarflexion      Ankle inversion      Ankle eversion       (Blank rows = not tested)   LUMBAR SPECIAL TESTS:  (+) SLR RLE    FUNCTIONAL TESTS:  5 times sit to stand: 15 seconds, increased low back pain   GAIT: Distance walked: 10 ft  Assistive device utilized: Single point cane Level of assistance: Modified independence Comments: WBOS, lateral trunk lean       TODAY'S TREATMENT  [facility] Adult PT Treatment:                                                DATE: 05/21/2021 Therapeutic Exercise: NuStep lvl 5 UE/LE x 5 min while taking subjective Seated row 3x12 BTB Seated horizontal abd 2x15 GTB Seated diagonals 2x10 GTB each Seated march 2x20 5# LAQ 2x10 5# LTR x 10 ea Supine PPT x 10  Supine PPT w/ ball squeeze 2x10 Supine bent knee fallout 2x20 BTB each Supine SLR 2x10 (small range with pulse) Supine "hundreds" 3x20" w/ PPT Seated fwd pball rollout x 15 - 5" - Blue pball  [facility] Adult PT Treatment:                                                DATE: 05/15/2021 Therapeutic Exercise: NuStep lvl 5 UE/LE x 5 min while taking subjective Seated row 3x12 BTB Seated horizontal abd 2x15 RTB Seated march 2x20 4# LAQ 2x15 4# LTR x 10  ea Supine PPT x 10  Supine PPT w/ ball squeeze 2x10 Supine bent knee fallout 3x20 BTB each Supine SLR 2x10 (small range with pulse) Supine "hundreds" 2x20" Seated fwd pball rollout 2x10 - Blue pball Standing hip abd 2x10 ea  [facility] Adult PT Treatment:                                                DATE: 05/08/2021 Therapeutic Exercise: NuStep lvl 5 UE/LE x 5 min while taking subjective Seated row 3x10 GTB Seated horizontal abd 2x10 RTB LTR x 10 ea Supine PPT x 10  Supine PPT w/ ball squeeze 2x10 Supine clamshell 3x15 BTB Supine march 2x20 BTB Supine SLR 2x10 (small range) Seated fwd pball rollout 2x10 Green Standing hip abd/ext 2x10 ea  [facility] Adult PT Treatment:                                                DATE: 04/29/2021 Therapeutic Exercise: Demonstrated and issued initial HEP   Therapeutic Activity: Education on assessment findings that will be addressed throughout duration of POC     PATIENT EDUCATION:  Education details: see treatment above  Person educated: Patient Education method:  Explanation, Demonstration, Tactile cues, Verbal cues, and Handouts Education comprehension: verbalized understanding, returned demonstration, verbal cues required, tactile cues required, and needs further education     HOME EXERCISE PROGRAM: Access Code: [email or web address] URL: [email or web address] Date: 05/15/2021 Prepared by: Octavio Manns  Exercises Supine Lower Trunk Rotation - 2 x daily - 7 x weekly - 2 sets - 10 reps Supine Pelvic Tilt - 2 x daily - 7 x weekly - 2 sets - 10 reps Supine Figure 4 Piriformis Stretch - 2 x daily - 7 x weekly - 3 sets - 30 hold Standing Shoulder Row with Anchored Resistance - 1 x daily - 7 x weekly - 3 sets - 10 reps Hooklying Single Leg Bent Knee Fallouts with Resistance - 1 x daily - 7 x weekly - 3 sets - 20 reps - blue exercise band hold   ASSESSMENT: Pt was again able to complete all prescribed exercises with no adverse effect or increase  in pain. Therapy today continue to focus on improving periscapular, core, and proximal hip strength for decreasing pain and improving mobility. Pt is progressing as expected with therapy and continues to benefit from skilled services. PT will continue to progress as able per POC.      GOALS: Goals reviewed with patient? No       LONG TERM GOALS:    LTG Name Target Date Goal status  1 Patient will demonstrate pain free lumbar AROM to improve ability to complete bending and lifting activity.  Baseline:see objective  06/10/2021 INITIAL  2 Patient will complete 5 x STS in < 12 seconds without increased back pain to signify improvements in functional strength.  Baseline: see objective 06/10/2021 INITIAL  3 Patient will tolerate at least 20 minutes of standing activity to improve tolerance to cleaning and grocery shopping Baseline: reports frequent rest breaks with cleaning at home and can only tolerate walking 1-2 aisles at grocery store currently.  06/10/2021 INITIAL  4 Patient will be independent with advanced home program to assist in management of her chronic condition.  Baseline: initial HEP issued  06/10/2021 INITIAL  5 Patient will score 56% function on FOTO to signify clinically meaningful improvement in functional abilities.  06/10/21 INITIAL    PLAN: PT FREQUENCY: 2x/week   PT DURATION: 6 weeks   PLANNED INTERVENTIONS: Therapeutic exercises, Therapeutic activity, Neuro Muscular re-education, Balance training, Gait training, Patient/Family education, Stair training, Aquatic Therapy, Dry Needling, Cryotherapy, Moist heat, and Manual therapy   PLAN FOR NEXT SESSION: review HEP, review FOTO, manual to L-spine, spinal mobility, hip/core strength    Ward Chatters, PT 05/21/2021, 9:56 AM

## 2021-05-23 ENCOUNTER — Other Ambulatory Visit: Payer: Self-pay

## 2021-05-23 ENCOUNTER — Ambulatory Visit: Payer: Medicare HMO

## 2021-05-23 DIAGNOSIS — G8929 Other chronic pain: Secondary | ICD-10-CM

## 2021-05-23 DIAGNOSIS — M545 Low back pain, unspecified: Secondary | ICD-10-CM

## 2021-05-23 DIAGNOSIS — M546 Pain in thoracic spine: Secondary | ICD-10-CM

## 2021-05-23 NOTE — Therapy (Signed)
OUTPATIENT PHYSICAL THERAPY TREATMENT NOTE   Patient Name: [name] MRN: [id number] DOB:10/24/1965, 56 y.o., female Today's Date: 05/23/2021  PCP: Leeanne Rio, MD REFERRING PROVIDER: Rise Patience, DO   PT End of Session - 05/23/21 1000     Visit Number 5    Number of Visits 13    Date for PT Re-Evaluation 06/15/21    Authorization Type [employer] primary; MCD secondary- requesting auth    PT Start Time 1000    PT Stop Time 1039    PT Time Calculation (min) 39 min    Activity Tolerance Patient tolerated treatment well    Behavior During Therapy WFL for tasks assessed/performed                Past Medical History:  Diagnosis Date   Allergy    Anemia    Anxiety    Arthritis    Blood transfusion without reported diagnosis    Cough    Cough 06/11/2018   Difficulty sleeping 06/11/2018   Diverticulitis with perforation 2010   DVT (deep vein thrombosis) in pregnancy 11/24/2016   DVT, lower extremity (HCC)    Endometrial cancer ([place])    Enlarged lymph nodes 08/13/2017   Family history of adverse reaction to anesthesia    pts daughter had difficulty awakening following anesthesia, long time to wake up   GERD (gastroesophageal reflux disease)    Headache    Hemoptysis 04/22/2018   History of bronchitis    History of ear infections    Hospital discharge follow-up 03/02/2019   Hx of migraines    Hyperlipidemia    Hypertension    IBS (irritable bowel syndrome)    Kidney infection    Lupus (systemic lupus erythematosus) ([place]) 02/2017   Morbid obesity ([place])    Neck pain 01/13/2019   Ovarian cancer ([place]) dx'd 01/2015   PONV (postoperative nausea and vomiting)    Port-A-Cath in place    Right side   Portacath in place 09/12/2015   Pre-diabetes    Pyelonephritis    Right knee pain 11/28/2011   Scleroderma ([place])    Screen for colon cancer 03/02/2019   Slow rate of speech 08/22/2015   Chronic from CVA.  She also has loss of left nasolabial fold and slight left  facial droop/small asymmetry on the left side secondary to CVA   UTI (urinary tract infection) 05/07/2018   Past Surgical History:  Procedure Laterality Date   ABDOMINAL HYSTERECTOMY N/A 03/13/2015   Procedure: TOTAL HYSTERECTOMY ABDOMINAL BILATERAL SALPINGO OOPHORECTOMY RADICAL TUMOR [place];  Surgeon: Everitt Amber, MD;  [facility]: [facility];  Service: Gynecology;  Laterality: N/A;   BOWEL RESECTION  03/13/2015   Procedure: SMALL BOWEL RESECTION;  Surgeon: Everitt Amber, MD;  Location: [facility];  Service: Gynecology;;   [place] and 1984   COLONOSCOPY WITH PROPOFOL N/A 07/19/2020   Procedure: COLONOSCOPY WITH PROPOFOL;  Surgeon: Doran Stabler, MD;  [facility];  Service: Gastroenterology;  Laterality: N/A;   COLOSTOMY  2008   COLOSTOMY TAKEDOWN     ESOPHAGOGASTRODUODENOSCOPY (EGD) WITH PROPOFOL N/A 12/21/2018   Procedure: ESOPHAGOGASTRODUODENOSCOPY (EGD) WITH PROPOFOL;  Surgeon: Doran Stabler, MD;  [facility];  Service: Gastroenterology;  Laterality: N/A;   HEMOSTASIS CLIP PLACEMENT  07/19/2020   Procedure: HEMOSTASIS CLIP PLACEMENT;  Surgeon: Doran Stabler, MD;  Location: [facility];  Service: Gastroenterology;;   LAPAROTOMY N/A 03/13/2015   Procedure: EXPLORATORY LAPAROTOMY;  Surgeon: Everitt Amber, MD;  [facility];  Service: Gynecology;  Laterality: N/A;   POLYPECTOMY  07/19/2020   Procedure: POLYPECTOMY;  Surgeon: Doran Stabler, MD;  Location: [facility];  Service: Gastroenterology;;   TONSILLECTOMY AND [place]  03/13/2015   Procedure: HERNIA REPAIR VENTRAL ADULT;  Surgeon: Everitt Amber, MD;  [facility];  Service: Gynecology;;   Patient Active Problem List   Diagnosis Date Noted   Chronic diastolic CHF (congestive heart failure) ([place]) 07/19/2020   Bronchospasm 07/19/2020   Incontinence 03/17/2020   Herpes simplex virus (HSV) infection of buttock 01/05/2020   OSA (obstructive sleep apnea)  07/25/2019   Prediabetes 07/25/2019   Lateral epicondylitis of left elbow 01/13/2019   Normocytic anemia 05/29/2018   Scleroderma ([place]) 04/22/2018   Swelling of foot joint, right 04/08/2018   Positive ANA (antinuclear antibody) 09/23/2016   Eczema 01/27/2016   History of pulmonary embolism 09/12/2015   Long term current use of anticoagulant therapy 09/06/2015   Chemotherapy-induced peripheral neuropathy ([place]) 06/13/2015   CKD (chronic kidney disease) stage 3, GFR 30-59 ml/min (HCC) 05/12/2015   GERD (gastroesophageal reflux disease) 05/12/2015   Ovarian carcinosarcoma, right ([place]) 03/29/2015   Endometrial ca ([place]) 03/29/2015   Ventral hernia without obstruction or gangrene    Morbid obesity ([place]) 02/19/2015   Essential hypertension, benign    Stress at home 12/14/2014   Left knee pain 08/04/2013   Seasonal allergies 07/29/2013    REFERRING DIAG:  M54.6 (ICD-10-CM) - Acute right-sided thoracic back pain   THERAPY DIAG:  Chronic bilateral low back pain without sciatica  Pain in thoracic spine  PERTINENT HISTORY:  Currently in remission from endometrial and ovarian cancer   PRECAUTIONS: None  SUBJECTIVE:  Pt presents to PT with reports of continued lower back pain and discomfort. She has been compliant with her HEP with no adverse effect. Pt is ready to begin PT at this time.   Pain: Are you having pain? Yes NPRS scale: 5/10 Pain location: mid and lower back  PAIN TYPE: aching Pain description: intermittent  Aggravating factors: pulling, lifting, reaching, prolonged standing Relieving factors: rest    OBJECTIVE:    DIAGNOSTIC FINDINGS:  N/A   PATIENT SURVEYS:  FOTO 36% function to 56% predicted    SCREENING FOR RED FLAGS: Bowel or bladder incontinence: No Cauda equina syndrome: No   COGNITION:          Overall cognitive status: Within functional limits for tasks assessed                        SENSATION:          Light touch: Appears intact              PALPATION: Tautness and palpable Rt thoracolumbar paraspinals.  PAIVM unable to accurately assess due to body habitus    LUMBARAROM/PROM   A/PROM A/PROM  04/29/2021  Flexion Full; increased pain low back  Extension Full; decreases pain   Right lateral flexion 25% limited   Left lateral flexion 50% limited, pulling Rt side low back  Right rotation 25% limited   Left rotation 50% limited pulling Rt side low back    (Blank rows = not tested)     LE MMT:   MMT Right 04/29/2021 Left 04/29/2021  Hip flexion 4; pn low back  4+  Hip extension      Hip abduction 3+; pn low back  3+  Hip adduction  Hip internal rotation      Hip external rotation      Knee flexion      Knee extension      Ankle dorsiflexion      Ankle plantarflexion      Ankle inversion      Ankle eversion       (Blank rows = not tested)   LUMBAR SPECIAL TESTS:  (+) SLR RLE    FUNCTIONAL TESTS:  5 times sit to stand: 15 seconds, increased low back pain   GAIT: Distance walked: 10 ft  Assistive device utilized: Single point cane Level of assistance: Modified independence Comments: WBOS, lateral trunk lean       TODAY'S TREATMENT  [facility] Adult PT Treatment:                                                DATE: 05/23/2021 Therapeutic Exercise: NuStep lvl 6 UE/LE x 5 min while taking subjective Seated row 3x12 Black TB Seated horizontal abd 2x15 GTB Seated diagonals 2x10 GTB each Seated march 2x20 5# STS 2x10 - elevated table  LAQ 2x10 5# LTR x 10 ea Supine PPT x 10  Supine PPT w/ ball squeeze 2x10 Supine bent knee fallout 2x20 BTB each Supine SLR 2x10 (small range with pulse) Seated fwd pball rollout x 15 - 5" - Blue pball Standing hip abduction 2x10 25# each  [facility] Adult PT Treatment:                                                DATE: 05/21/2021 Therapeutic Exercise: NuStep lvl 5 UE/LE x 5 min while taking subjective Seated row 3x12 BTB Seated horizontal abd 2x15 GTB Seated diagonals 2x10  GTB each Seated march 2x20 5# LAQ 2x10 5# LTR x 10 ea Supine PPT x 10  Supine PPT w/ ball squeeze 2x10 Supine bent knee fallout 2x20 BTB each Supine SLR 2x10 (small range with pulse) Supine "hundreds" 3x20" w/ PPT Seated fwd pball rollout x 15 - 5" - Blue pball  [facility] Adult PT Treatment:                                                DATE: 05/15/2021 Therapeutic Exercise: NuStep lvl 5 UE/LE x 5 min while taking subjective Seated row 3x12 BTB Seated horizontal abd 2x15 RTB Seated march 2x20 4# LAQ 2x15 4# LTR x 10 ea Supine PPT x 10  Supine PPT w/ ball squeeze 2x10 Supine bent knee fallout 3x20 BTB each Supine SLR 2x10 (small range with pulse) Supine "hundreds" 2x20" Seated fwd pball rollout 2x10 - Blue pball Standing hip abd 2x10 ea  PATIENT EDUCATION:  Education details: see treatment above  Person educated: Patient Education method: Explanation, Demonstration, Tactile cues, Verbal cues, and Handouts Education comprehension: verbalized understanding, returned demonstration, verbal cues required, tactile cues required, and needs further education     HOME EXERCISE PROGRAM: Access Code: [email or web address] URL: [email or web address] Date: 05/15/2021 Prepared by: Octavio Manns  Exercises Supine Lower Trunk Rotation - 2 x daily - 7 x weekly - 2 sets - 10 reps Supine  Pelvic Tilt - 2 x daily - 7 x weekly - 2 sets - 10 reps Supine Figure 4 Piriformis Stretch - 2 x daily - 7 x weekly - 3 sets - 30 hold Standing Shoulder Row with Anchored Resistance - 1 x daily - 7 x weekly - 3 sets - 10 reps Hooklying Single Leg Bent Knee Fallouts with Resistance - 1 x daily - 7 x weekly - 3 sets - 20 reps - blue exercise band hold   ASSESSMENT: Pt was again able to complete all prescribed exercises with no adverse effect or increase in pain. Therapy today again focused on improving core, periscapular, and proximal hip strength for improving functional mobility and decreasing pain. She  continues to benefit from skilled PT services, with PT progressing towards more standing activity. Will continue to progress as tolerated.      GOALS: Goals reviewed with patient? No       LONG TERM GOALS:    LTG Name Target Date Goal status  1 Patient will demonstrate pain free lumbar AROM to improve ability to complete bending and lifting activity.  Baseline:see objective  06/10/2021 INITIAL  2 Patient will complete 5 x STS in < 12 seconds without increased back pain to signify improvements in functional strength.  Baseline: see objective 06/10/2021 INITIAL  3 Patient will tolerate at least 20 minutes of standing activity to improve tolerance to cleaning and grocery shopping Baseline: reports frequent rest breaks with cleaning at home and can only tolerate walking 1-2 aisles at grocery store currently.  06/10/2021 INITIAL  4 Patient will be independent with advanced home program to assist in management of her chronic condition.  Baseline: initial HEP issued  06/10/2021 INITIAL  5 Patient will score 56% function on FOTO to signify clinically meaningful improvement in functional abilities.  06/10/21 INITIAL    PLAN: PT FREQUENCY: 2x/week   PT DURATION: 6 weeks   PLANNED INTERVENTIONS: Therapeutic exercises, Therapeutic activity, Neuro Muscular re-education, Balance training, Gait training, Patient/Family education, Stair training, Aquatic Therapy, Dry Needling, Cryotherapy, Moist heat, and Manual therapy   PLAN FOR NEXT SESSION: review HEP, review FOTO, manual to L-spine, spinal mobility, hip/core strength    Ward Chatters, PT 05/23/2021, 10:41 AM

## 2021-05-24 MED ORDER — BACLOFEN 10 MG PO TABS: 10.0000 mg | ORAL_TABLET | Freq: Two times a day (BID) | ORAL | 0 refills | Status: DC | PRN

## 2021-05-28 ENCOUNTER — Other Ambulatory Visit: Payer: Self-pay

## 2021-05-28 ENCOUNTER — Ambulatory Visit: Payer: Medicare HMO

## 2021-05-28 DIAGNOSIS — M545 Low back pain, unspecified: Secondary | ICD-10-CM

## 2021-05-28 DIAGNOSIS — G8929 Other chronic pain: Secondary | ICD-10-CM

## 2021-05-28 DIAGNOSIS — M546 Pain in thoracic spine: Secondary | ICD-10-CM

## 2021-05-28 NOTE — Therapy (Signed)
PT went to get patient from lobby, however, she stated that she just got a phone call that her son was in the hospital and she would have to leave immediately.   PT will not count this visit, will place a no-charge encounter and continue with current plan of care.   Ward Chatters, PT 05/28/21 10:09 AM

## 2021-05-29 NOTE — Therapy (Signed)
OUTPATIENT PHYSICAL THERAPY TREATMENT NOTE   Patient Name: [name] MRN: [id number] DOB:Sep 11, 1965, 56 y.o., female Today's Date: 05/30/2021  PCP: Leeanne Rio, MD REFERRING PROVIDER: Rise Patience, DO   PT End of Session - 05/30/21 1011     Visit Number 6    Number of Visits 13    Date for PT Re-Evaluation 06/15/21    Authorization Type [employer] primary; MCD secondary    Authorization Time Period 1/23-3/10/23    Authorization - Visit Number 5    Authorization - Number of Visits 12    PT Start Time 2542    PT Stop Time 7062    PT Time Calculation (min) 42 min    Activity Tolerance Patient tolerated treatment well    Behavior During Therapy WFL for tasks assessed/performed                 Past Medical History:  Diagnosis Date   Allergy    Anemia    Anxiety    Arthritis    Blood transfusion without reported diagnosis    Cough    Cough 06/11/2018   Difficulty sleeping 06/11/2018   Diverticulitis with perforation 2010   DVT (deep vein thrombosis) in pregnancy 11/24/2016   DVT, lower extremity (HCC)    Endometrial cancer ([place])    Enlarged lymph nodes 08/13/2017   Family history of adverse reaction to anesthesia    pts daughter had difficulty awakening following anesthesia, long time to wake up   GERD (gastroesophageal reflux disease)    Headache    Hemoptysis 04/22/2018   History of bronchitis    History of ear infections    Hospital discharge follow-up 03/02/2019   Hx of migraines    Hyperlipidemia    Hypertension    IBS (irritable bowel syndrome)    Kidney infection    Lupus (systemic lupus erythematosus) ([place]) 02/2017   Morbid obesity ([place])    Neck pain 01/13/2019   Ovarian cancer ([place]) dx'd 01/2015   PONV (postoperative nausea and vomiting)    Port-A-Cath in place    Right side   Portacath in place 09/12/2015   Pre-diabetes    Pyelonephritis    Right knee pain 11/28/2011   Scleroderma ([place])    Screen for colon cancer 03/02/2019   Slow  rate of speech 08/22/2015   Chronic from CVA.  She also has loss of left nasolabial fold and slight left facial droop/small asymmetry on the left side secondary to CVA   UTI (urinary tract infection) 05/07/2018   Past Surgical History:  Procedure Laterality Date   ABDOMINAL HYSTERECTOMY N/A 03/13/2015   Procedure: TOTAL HYSTERECTOMY ABDOMINAL BILATERAL SALPINGO OOPHORECTOMY RADICAL TUMOR [place];  Surgeon: Everitt Amber, MD;  [facility];  Service: Gynecology;  Laterality: N/A;   BOWEL RESECTION  03/13/2015   Procedure: SMALL BOWEL RESECTION;  Surgeon: Everitt Amber, MD;  [facility];  Service: Gynecology;;   [place] and 1984   COLONOSCOPY WITH PROPOFOL N/A 07/19/2020   Procedure: COLONOSCOPY WITH PROPOFOL;  Surgeon: Doran Stabler, MD;  [facility];  Service: Gastroenterology;  Laterality: N/A;   COLOSTOMY  2008   COLOSTOMY TAKEDOWN     ESOPHAGOGASTRODUODENOSCOPY (EGD) WITH PROPOFOL N/A 12/21/2018   Procedure: ESOPHAGOGASTRODUODENOSCOPY (EGD) WITH PROPOFOL;  Surgeon: Doran Stabler, MD;  [facility];  Service: Gastroenterology;  Laterality: N/A;   HEMOSTASIS CLIP PLACEMENT  07/19/2020   Procedure: HEMOSTASIS CLIP PLACEMENT;  Surgeon: Doran Stabler, MD;  [facility];  Service: Gastroenterology;;   LAPAROTOMY N/A 03/13/2015   Procedure: EXPLORATORY LAPAROTOMY;  Surgeon: Everitt Amber, MD;  [facility];  Service: Gynecology;  Laterality: N/A;   POLYPECTOMY  07/19/2020   Procedure: POLYPECTOMY;  Surgeon: Doran Stabler, MD;  Location: [facility];  Service: Gastroenterology;;   TONSILLECTOMY AND [place]  03/13/2015   Procedure: HERNIA REPAIR VENTRAL ADULT;  Surgeon: Everitt Amber, MD;  [facility];  Service: Gynecology;;   Patient Active Problem List   Diagnosis Date Noted   Chronic diastolic CHF (congestive heart failure) ([place]) 07/19/2020   Bronchospasm 07/19/2020   Incontinence  03/17/2020   Herpes simplex virus (HSV) infection of buttock 01/05/2020   OSA (obstructive sleep apnea) 07/25/2019   Prediabetes 07/25/2019   Lateral epicondylitis of left elbow 01/13/2019   Normocytic anemia 05/29/2018   Scleroderma ([place]) 04/22/2018   Swelling of foot joint, right 04/08/2018   Positive ANA (antinuclear antibody) 09/23/2016   Eczema 01/27/2016   History of pulmonary embolism 09/12/2015   Long term current use of anticoagulant therapy 09/06/2015   Chemotherapy-induced peripheral neuropathy ([place]) 06/13/2015   CKD (chronic kidney disease) stage 3, GFR 30-59 ml/min (HCC) 05/12/2015   GERD (gastroesophageal reflux disease) 05/12/2015   Ovarian carcinosarcoma, right ([place]) 03/29/2015   Endometrial ca ([place]) 03/29/2015   Ventral hernia without obstruction or gangrene    Morbid obesity ([place]) 02/19/2015   Essential hypertension, benign    Stress at home 12/14/2014   Left knee pain 08/04/2013   Seasonal allergies 07/29/2013    REFERRING DIAG:  M54.6 (ICD-10-CM) - Acute right-sided thoracic back pain   THERAPY DIAG:  Chronic bilateral low back pain without sciatica  Pain in thoracic spine  PERTINENT HISTORY:  Currently in remission from endometrial and ovarian cancer   PRECAUTIONS: None  SUBJECTIVE:  "It's been getting better." Patient reports the pain can worsen as the day goes on depending on how much activity she is doing.   Pain: Are you having pain? Yes NPRS scale: 5/10 Pain location: mid and lower back  PAIN TYPE: aching Pain description: intermittent  Aggravating factors: pulling, lifting, reaching, prolonged standing Relieving factors: rest    OBJECTIVE:   *Unless otherwise noted all objective measures were captured on initial evaluation.   DIAGNOSTIC FINDINGS:  N/A   PATIENT SURVEYS:  FOTO 36% function to 56% predicted  05/30/21: 52% function       PALPATION: Tautness and palpable Rt thoracolumbar paraspinals.  PAIVM unable to accurately  assess due to body habitus    LUMBARAROM/PROM   A/PROM A/PROM  04/29/2021  Flexion Full; increased pain low back  Extension Full; decreases pain   Right lateral flexion 25% limited   Left lateral flexion 50% limited, pulling Rt side low back  Right rotation 25% limited   Left rotation 50% limited pulling Rt side low back    (Blank rows = not tested)     LE MMT:   MMT Right 04/29/2021 Left 04/29/2021  Hip flexion 4; pn low back  4+  Hip extension      Hip abduction 3+; pn low back  3+  Hip adduction      Hip internal rotation      Hip external rotation      Knee flexion      Knee extension      Ankle dorsiflexion      Ankle plantarflexion      Ankle inversion  Ankle eversion       (Blank rows = not tested)   LUMBAR SPECIAL TESTS:  (+) SLR RLE    FUNCTIONAL TESTS:  5 times sit to stand: 15 seconds, increased low back pain   GAIT: Distance walked: 10 ft  Assistive device utilized: Single point cane Level of assistance: Modified independence Comments: WBOS, lateral trunk lean       TODAY'S TREATMENT  [facility] Adult PT Treatment:                                                DATE: 05/30/21 Therapeutic Exercise: Sidelying thoracic rotation 1 x 10 each  Supine resisted horizontal shoulder abduction red band 2 x 10  Serratus punch with stability ball 2 x 10  Bilateral shoulder ER 2 x 10 red band  Resisted shoulder extension 2 x 10 yellow band  Resisted rows red band 2 x 10   Self Care: Education on appropriate sleep positioning, sitting/standing posture, bending and lifting mechanics. Handout provided.    [facility] Adult PT Treatment:                                                DATE: 05/23/2021 Therapeutic Exercise: NuStep lvl 6 UE/LE x 5 min while taking subjective Seated row 3x12 Black TB Seated horizontal abd 2x15 GTB Seated diagonals 2x10 GTB each Seated march 2x20 5# STS 2x10 - elevated table  LAQ 2x10 5# LTR x 10 ea Supine PPT x 10  Supine PPT w/ ball  squeeze 2x10 Supine bent knee fallout 2x20 BTB each Supine SLR 2x10 (small range with pulse) Seated fwd pball rollout x 15 - 5" - Blue pball Standing hip abduction 2x10 25# each  [facility] Adult PT Treatment:                                                DATE: 05/21/2021 Therapeutic Exercise: NuStep lvl 5 UE/LE x 5 min while taking subjective Seated row 3x12 BTB Seated horizontal abd 2x15 GTB Seated diagonals 2x10 GTB each Seated march 2x20 5# LAQ 2x10 5# LTR x 10 ea Supine PPT x 10  Supine PPT w/ ball squeeze 2x10 Supine bent knee fallout 2x20 BTB each Supine SLR 2x10 (small range with pulse) Supine "hundreds" 3x20" w/ PPT Seated fwd pball rollout x 15 - 5" - Blue pball  [facility] Adult PT Treatment:                                                DATE: 05/15/2021 Therapeutic Exercise: NuStep lvl 5 UE/LE x 5 min while taking subjective Seated row 3x12 BTB Seated horizontal abd 2x15 RTB Seated march 2x20 4# LAQ 2x15 4# LTR x 10 ea Supine PPT x 10  Supine PPT w/ ball squeeze 2x10 Supine bent knee fallout 3x20 BTB each Supine SLR 2x10 (small range with pulse) Supine "hundreds" 2x20" Seated fwd pball rollout 2x10 - Blue pball Standing hip abd 2x10 ea  PATIENT EDUCATION:  Education details: see treatment above  Person educated: Patient Education method: Explanation, Demonstration, Tactile cues, Verbal cues, and Handouts Education comprehension: verbalized understanding, returned demonstration, verbal cues required, tactile cues required, and needs further education     HOME EXERCISE PROGRAM: Access Code: [email or web address] URL: [email or web address] Date: 05/30/2021 Prepared by: Gwendolyn Grant  Exercises Supine Lower Trunk Rotation - 2 x daily - 7 x weekly - 2 sets - 10 reps Supine Pelvic Tilt - 2 x daily - 7 x weekly - 2 sets - 10 reps Supine Figure 4 Piriformis Stretch - 2 x daily - 7 x weekly - 3 sets - 30 hold Standing Shoulder Row with Anchored Resistance - 1 x daily - 7  x weekly - 3 sets - 10 reps Hooklying Single Leg Bent Knee Fallouts with Resistance - 1 x daily - 7 x weekly - 3 sets - 20 reps - blue exercise band hold Sidelying Thoracic Rotation with Open Book - 1 x daily - 7 x weekly - 1 sets - 10 reps Shoulder External Rotation and Scapular Retraction with Resistance - 1 x daily - 7 x weekly - 2 sets - 10 reps    ASSESSMENT: Patient tolerated session well today focusing on improving thoracic mobility and periscapular strengthening. Intermittent cues required to decrease excessive upper trap engagement with ability to correct once cued. Time spent educating patient on appropriate sleep positioning, posture, and bending/lifting mechanics with patient verbalizing understanding. She will require further instruction on proper lifting and bending at future sessions. FOTO score has much improved compared to baseline, nearing the predicted outcome. No reports of increased pain throughout session.      GOALS: Goals reviewed with patient? No       LONG TERM GOALS:    LTG Name Target Date Goal status  1 Patient will demonstrate pain free lumbar AROM to improve ability to complete bending and lifting activity.  Baseline:see objective  06/10/2021 INITIAL  2 Patient will complete 5 x STS in < 12 seconds without increased back pain to signify improvements in functional strength.  Baseline: see objective 06/10/2021 INITIAL  3 Patient will tolerate at least 20 minutes of standing activity to improve tolerance to cleaning and grocery shopping Baseline: reports frequent rest breaks with cleaning at home and can only tolerate walking 1-2 aisles at grocery store currently.  06/10/2021 INITIAL  4 Patient will be independent with advanced home program to assist in management of her chronic condition.  Baseline: initial HEP issued  06/10/2021 INITIAL  5 Patient will score 56% function on FOTO to signify clinically meaningful improvement in functional abilities.  06/10/21 INITIAL     PLAN: PT FREQUENCY: 2x/week   PT DURATION: 6 weeks   PLANNED INTERVENTIONS: Therapeutic exercises, Therapeutic activity, Neuro Muscular re-education, Balance training, Gait training, Patient/Family education, Stair training, Aquatic Therapy, Dry Needling, Cryotherapy, Moist heat, and Manual therapy   PLAN FOR NEXT SESSION:  manual to L-spine, spinal mobility, hip/core strength  Gwendolyn Grant, PT, DPT, ATC 05/30/21 11:02 AM

## 2021-05-30 ENCOUNTER — Other Ambulatory Visit: Payer: Self-pay

## 2021-05-30 ENCOUNTER — Ambulatory Visit: Payer: Medicare HMO

## 2021-05-30 DIAGNOSIS — M546 Pain in thoracic spine: Secondary | ICD-10-CM

## 2021-05-30 DIAGNOSIS — M545 Low back pain, unspecified: Secondary | ICD-10-CM

## 2021-05-30 DIAGNOSIS — G8929 Other chronic pain: Secondary | ICD-10-CM

## 2021-05-30 NOTE — Patient Instructions (Signed)

## 2021-06-04 ENCOUNTER — Telehealth: Payer: Self-pay

## 2021-06-04 ENCOUNTER — Ambulatory Visit: Payer: Medicare HMO

## 2021-06-04 NOTE — Telephone Encounter (Signed)
Patient calls nurse line regarding productive cough and wheezing at night. Reports coughing up yellowish green sputum. Reports slight body aches. Reports symptoms for the last three days.   Denies fever, difficulty breathing and chest pain.   Patient has taking inhalers as needed.   Offered to schedule patient appointment for today, patient requests to schedule appointment for tomorrow.   Scheduled for tomorrow afternoon. ED precautions given.   Talbot Grumbling, RN

## 2021-06-05 ENCOUNTER — Other Ambulatory Visit: Payer: Self-pay

## 2021-06-05 ENCOUNTER — Ambulatory Visit (INDEPENDENT_AMBULATORY_CARE_PROVIDER_SITE_OTHER): Payer: Medicare HMO | Admitting: Family Medicine

## 2021-06-05 DIAGNOSIS — B349 Viral infection, unspecified: Secondary | ICD-10-CM | POA: Diagnosis not present

## 2021-06-05 NOTE — Patient Instructions (Signed)
It was great seeing you today! ? ?I am sorry that you are not feeling well. It is most likely that you have a viral infection. I would take your inhaler at least 1-2 times daily so that we try to prevent any worsening respiratory symptoms. You do not need antibiotics because it does not seem like you have a bacterial infection or pneumonia.  ?  ?Your symptoms should continue to improve. If you are having any trouble breathing, then please go to the emergency department.  ? ?Please follow up at your next scheduled appointment, if anything arises between now and then, please don't hesitate to contact our office. ? ? ?Thank you for allowing [place] to be a part of your medical care! ? ?Thank you, ?Dr. Larae Grooms  ?

## 2021-06-05 NOTE — Progress Notes (Signed)
? ? ?  SUBJECTIVE:  ? ?CHIEF COMPLAINT / HPI:  ? ?Patient with history of asthma and OSA presents with cough with yellowish-clear, wheezing, rhinorrhea, headache (resolved) and body aches for the past 4-5 days. She has been alternating between her inhaler and nebulizer, using it more often over the last few days. Typically does not use her inhaler daily as she does not need it, uses it more when she is sick or during seasonal allergy season. Denies any dyspnea and chest pain. Some of her symptoms are improving. Using her inhalers helps. Denies any sick contacts. Has been using the [facility] public pool regularly but otherwise has not been around anyone, thinks maybe this may have led to her symptoms. She is up to date on all her vaccines including getting her COVID booster and flu vaccines.  ? ?OBJECTIVE:  ? ?BP 126/68   Pulse 74   Ht 5\' 5"  (1.651 m)   Wt (!) 367 lb 12.8 oz (166.8 kg)   LMP 10/26/2013   SpO2 98%   BMI 61.21 kg/m?   ?General: Patient well-groomed, in no acute distress. ?HEENT: normal buccal mucosa, presence of mild cervical LAD ?CV: RRR, no murmurs or gallops auscultated ?Resp: mild congestion noted otherwise CTAB, no wheezing, rales or rhonchi noted, no focal findings noted ?Ext: no LE edema noted bilaterally ? ?ASSESSMENT/PLAN:  ? ?Viral illness ?-low concern for pneumonia or other bacterial illness given physical exam findings ?-likely viral etiology, explained to patient that symptoms will resolve and no indication for antibiotic treatment  ?-continue albuterol as needed ?-strict ED precautions discussed  ? ? ? ?Donney Dice, DO ?[place]  ?

## 2021-06-05 NOTE — Assessment & Plan Note (Signed)
-  low concern for pneumonia or other bacterial illness given physical exam findings ?-likely viral etiology, explained to patient that symptoms will resolve and no indication for antibiotic treatment  ?-continue albuterol as needed ?-strict ED precautions discussed  ?

## 2021-06-06 ENCOUNTER — Ambulatory Visit: Payer: Medicare HMO

## 2021-06-11 ENCOUNTER — Other Ambulatory Visit: Payer: Self-pay

## 2021-06-11 ENCOUNTER — Other Ambulatory Visit: Payer: Self-pay | Admitting: Family Medicine

## 2021-06-11 ENCOUNTER — Ambulatory Visit: Payer: Medicare HMO | Attending: Family Medicine

## 2021-06-11 DIAGNOSIS — M6281 Muscle weakness (generalized): Secondary | ICD-10-CM | POA: Insufficient documentation

## 2021-06-11 DIAGNOSIS — R293 Abnormal posture: Secondary | ICD-10-CM | POA: Insufficient documentation

## 2021-06-11 DIAGNOSIS — R2689 Other abnormalities of gait and mobility: Secondary | ICD-10-CM | POA: Insufficient documentation

## 2021-06-11 DIAGNOSIS — M545 Low back pain, unspecified: Secondary | ICD-10-CM | POA: Diagnosis present

## 2021-06-11 DIAGNOSIS — R2681 Unsteadiness on feet: Secondary | ICD-10-CM | POA: Insufficient documentation

## 2021-06-11 DIAGNOSIS — M546 Pain in thoracic spine: Secondary | ICD-10-CM | POA: Insufficient documentation

## 2021-06-11 DIAGNOSIS — M25612 Stiffness of left shoulder, not elsewhere classified: Secondary | ICD-10-CM | POA: Insufficient documentation

## 2021-06-11 DIAGNOSIS — M542 Cervicalgia: Secondary | ICD-10-CM | POA: Diagnosis present

## 2021-06-11 DIAGNOSIS — M25512 Pain in left shoulder: Secondary | ICD-10-CM | POA: Insufficient documentation

## 2021-06-11 DIAGNOSIS — G8929 Other chronic pain: Secondary | ICD-10-CM | POA: Diagnosis present

## 2021-06-11 DIAGNOSIS — R252 Cramp and spasm: Secondary | ICD-10-CM | POA: Diagnosis present

## 2021-06-11 DIAGNOSIS — M25511 Pain in right shoulder: Secondary | ICD-10-CM | POA: Insufficient documentation

## 2021-06-11 NOTE — Therapy (Signed)
OUTPATIENT PHYSICAL THERAPY TREATMENT NOTE   Patient Name: [name] MRN: [id number] DOB:[birth year], 56 y.o., female Today's Date: 06/11/2021  PCP: Leeanne Rio, MD REFERRING PROVIDER: Leeanne Rio, MD   PT End of Session - 06/11/21 1000     Visit Number 7    Number of Visits 13    Date for PT Re-Evaluation 07/23/21    Authorization Type [employer] primary; MCD secondary    Authorization Time Period 1/23-3/10/23    Authorization - Visit Number 6    Authorization - Number of Visits 12    PT Start Time 1000    PT Stop Time 2094    PT Time Calculation (min) 40 min    Activity Tolerance Patient tolerated treatment well    Behavior During Therapy [facility] for tasks assessed/performed                  Past Medical History:  Diagnosis Date   Allergy    Anemia    Anxiety    Arthritis    Blood transfusion without reported diagnosis    Cough    Cough 06/11/2018   Difficulty sleeping 06/11/2018   Diverticulitis with perforation 2010   DVT (deep vein thrombosis) in pregnancy 11/24/2016   DVT, lower extremity ([place])    Endometrial cancer ([place])    Enlarged lymph nodes 08/13/2017   Family history of adverse reaction to anesthesia    pts daughter had difficulty awakening following anesthesia, long time to wake up   GERD (gastroesophageal reflux disease)    Headache    Hemoptysis 04/22/2018   History of bronchitis    History of ear infections    Hospital discharge follow-up 03/02/2019   Hx of migraines    Hyperlipidemia    Hypertension    IBS (irritable bowel syndrome)    Kidney infection    Lupus (systemic lupus erythematosus) ([place]) 02/2017   Morbid obesity ([place])    Neck pain 01/13/2019   Ovarian cancer ([place]) dx'd 01/2015   PONV (postoperative nausea and vomiting)    Port-A-Cath in place    Right side   Portacath in place 09/12/2015   Pre-diabetes    Pyelonephritis    Right knee pain 11/28/2011   Scleroderma ([place])    Screen for colon cancer 03/02/2019    Slow rate of speech 08/22/2015   Chronic from CVA.  She also has loss of left nasolabial fold and slight left facial droop/small asymmetry on the left side secondary to CVA   UTI (urinary tract infection) 05/07/2018   Past Surgical History:  Procedure Laterality Date   ABDOMINAL HYSTERECTOMY N/A 03/13/2015   Procedure: TOTAL HYSTERECTOMY ABDOMINAL BILATERAL SALPINGO OOPHORECTOMY RADICAL TUMOR [place];  Surgeon: Everitt Amber, MD;  [facility];  Service: Gynecology;  Laterality: N/A;   BOWEL RESECTION  03/13/2015   Procedure: SMALL BOWEL RESECTION;  Surgeon: Everitt Amber, MD;  Location: [facility];  Service: Gynecology;;   [place] and 1984   COLONOSCOPY WITH PROPOFOL N/A 07/19/2020   Procedure: COLONOSCOPY WITH PROPOFOL;  Surgeon: Doran Stabler, MD;  [facility];  Service: Gastroenterology;  Laterality: N/A;   COLOSTOMY  2008   COLOSTOMY TAKEDOWN     ESOPHAGOGASTRODUODENOSCOPY (EGD) WITH PROPOFOL N/A 12/21/2018   Procedure: ESOPHAGOGASTRODUODENOSCOPY (EGD) WITH PROPOFOL;  Surgeon: Doran Stabler, MD;  [facility];  Service: Gastroenterology;  Laterality: N/A;   HEMOSTASIS CLIP PLACEMENT  07/19/2020   Procedure: HEMOSTASIS CLIP PLACEMENT;  Surgeon: Nelida Meuse  III, MD;  [facility];  Service: Gastroenterology;;   LAPAROTOMY N/A 03/13/2015   Procedure: EXPLORATORY LAPAROTOMY;  Surgeon: Everitt Amber, MD;  [facility];  Service: Gynecology;  Laterality: N/A;   POLYPECTOMY  07/19/2020   Procedure: POLYPECTOMY;  Surgeon: Doran Stabler, MD;  Location[facility];  Service: Gastroenterology;;   TONSILLECTOMY AND [place]  03/13/2015   Procedure: HERNIA REPAIR VENTRAL ADULT;  Surgeon: Everitt Amber, MD;  [facility];  Service: Gynecology;;   Patient Active Problem List   Diagnosis Date Noted   Viral illness 06/05/2021   Chronic diastolic CHF (congestive heart failure) ([place]) 07/19/2020   Bronchospasm  07/19/2020   Incontinence 03/17/2020   Herpes simplex virus (HSV) infection of buttock 01/05/2020   OSA (obstructive sleep apnea) 07/25/2019   Prediabetes 07/25/2019   Lateral epicondylitis of left elbow 01/13/2019   Normocytic anemia 05/29/2018   Scleroderma ([place]) 04/22/2018   Swelling of foot joint, right 04/08/2018   Positive ANA (antinuclear antibody) 09/23/2016   Eczema 01/27/2016   History of pulmonary embolism 09/12/2015   Long term current use of anticoagulant therapy 09/06/2015   Chemotherapy-induced peripheral neuropathy ([place]) 06/13/2015   CKD (chronic kidney disease) stage 3, GFR 30-59 ml/min (HCC) 05/12/2015   GERD (gastroesophageal reflux disease) 05/12/2015   Ovarian carcinosarcoma, right ([place]) 03/29/2015   Endometrial ca ([place]) 03/29/2015   Ventral hernia without obstruction or gangrene    Morbid obesity ([place]) 02/19/2015   Essential hypertension, benign    Stress at home 12/14/2014   Left knee pain 08/04/2013   Seasonal allergies 07/29/2013    REFERRING DIAG:  M54.6 (ICD-10-CM) - Acute right-sided thoracic back pain   THERAPY DIAG:  Chronic bilateral low back pain without sciatica  Pain in thoracic spine  PERTINENT HISTORY:  Currently in remission from endometrial and ovarian cancer   PRECAUTIONS: None  SUBJECTIVE:  Pt presents to PT with reports of bilateral LE pain. Notes she was sick last week and was unable to complete HEP. Pt is ready to begin PT at this time.  Pain: Are you having pain? Yes NPRS scale: 5/10 Pain location: lower back and bilateral LE PAIN TYPE: aching Pain description: intermittent  Aggravating factors: pulling, lifting, reaching, prolonged standing Relieving factors: rest    OBJECTIVE:   *Unless otherwise noted all objective measures were captured on initial evaluation.   DIAGNOSTIC FINDINGS:  N/A   PATIENT SURVEYS:  FOTO 36% function to 56% predicted  05/30/21: 52% function    LUMBARAROM/PROM   A/PROM A/PROM   04/29/2021  Flexion Full; increased pain low back  Extension Full; decreases pain   Right lateral flexion 25% limited   Left lateral flexion 50% limited, pulling Rt side low back  Right rotation 25% limited   Left rotation 50% limited pulling Rt side low back    (Blank rows = not tested)     LE MMT:   MMT Right 04/29/2021 Left 04/29/2021  Hip flexion 4; pn low back  4+  Hip extension      Hip abduction 3+; pn low back  3+  Hip adduction      Hip internal rotation      Hip external rotation      Knee flexion      Knee extension      Ankle dorsiflexion      Ankle plantarflexion      Ankle inversion      Ankle eversion       (  Blank rows = not tested)   FUNCTIONAL TESTS:  5 times sit to stand: 14 seconds      TODAY'S TREATMENT  [facility] Adult PT Treatment:                                                DATE: 06/11/2021 Therapeutic Exercise: Seated physioball rollout x 15 - 5" hold Supine PPT x 10 - 5" hold Supine PPT with ball squeeze 2x10 - 5" hold Supine clamshell 3x15 Black TB Supine march 2x20 Black TB LTR x 10 Supine SLR 2x10 (small range with pulse) Seated row 3x12 Black TB Seated horizontal abd 3x15 GTB Seated diagonals 2x10 GTB each LAQ 2x10 5# Seated march 2x20 5# STS 2x10 - elevated table   [facility] Adult PT Treatment:                                                DATE: 05/30/21 Therapeutic Exercise: Sidelying thoracic rotation 1 x 10 each  Supine resisted horizontal shoulder abduction red band 2 x 10  Serratus punch with stability ball 2 x 10  Bilateral shoulder ER 2 x 10 red band  Resisted shoulder extension 2 x 10 yellow band  Resisted rows red band 2 x 10  Self Care: Education on appropriate sleep positioning, sitting/standing posture, bending and lifting mechanics. Handout provided.    PATIENT EDUCATION:  Education details: see treatment above  Person educated: Patient Education method: Explanation, Demonstration, Tactile cues, Verbal cues, and  Handouts Education comprehension: verbalized understanding, returned demonstration, verbal cues required, tactile cues required, and needs further education     HOME EXERCISE PROGRAM: Access Code: [email or web address] URL: [email or web address] Date: 05/30/2021 Prepared by: Gwendolyn Grant  Exercises Supine Lower Trunk Rotation - 2 x daily - 7 x weekly - 2 sets - 10 reps Supine Pelvic Tilt - 2 x daily - 7 x weekly - 2 sets - 10 reps Supine Figure 4 Piriformis Stretch - 2 x daily - 7 x weekly - 3 sets - 30 hold Standing Shoulder Row with Anchored Resistance - 1 x daily - 7 x weekly - 3 sets - 10 reps Hooklying Single Leg Bent Knee Fallouts with Resistance - 1 x daily - 7 x weekly - 3 sets - 20 reps - blue exercise band hold Sidelying Thoracic Rotation with Open Book - 1 x daily - 7 x weekly - 1 sets - 10 reps Shoulder External Rotation and Scapular Retraction with Resistance - 1 x daily - 7 x weekly - 2 sets - 10 reps    ASSESSMENT: Pt was able to complete all prescribed exercises with no adverse effect or increase in pain. Therapy today focused on improving core and LE strength as well as periscapular strength in order to decrease pain and improve mobility. Some exercises withheld as pt was getting over RSV infection from the previous week. She continues to benefit from skilled PT services and will continue to be seen and progressed as tolerated per POC.      GOALS: Goals reviewed with patient? No       LONG TERM GOALS:    LTG Name Target Date Goal status  1 Patient will demonstrate pain free lumbar AROM to improve ability to  complete bending and lifting activity.  Baseline:see objective  07/23/2021 ONGOING  2 Patient will complete 5 x STS in < 12 seconds without increased back pain to signify improvements in functional strength.  Baseline: see objective 06/11/2021: 14 seconds 07/23/2021 ONGOING  3 Patient will tolerate at least 20 minutes of standing activity to improve tolerance to  cleaning and grocery shopping Baseline: reports frequent rest breaks with cleaning at home and can only tolerate walking 1-2 aisles at grocery store currently.  07/23/2021 ONGOING  4 Patient will be independent with advanced home program to assist in management of her chronic condition.  Baseline: initial HEP issued  07/23/2021 ONGOING  5 Patient will score 56% function on FOTO to signify clinically meaningful improvement in functional abilities.  Update: 52% function - 05/30/2021 07/23/2021 ONGOING    PLAN: PT FREQUENCY: 2x/week   PT DURATION: 6 weeks   PLANNED INTERVENTIONS: Therapeutic exercises, Therapeutic activity, Neuro Muscular re-education, Balance training, Gait training, Patient/Family education, Stair training, Aquatic Therapy, Dry Needling, Cryotherapy, Moist heat, and Manual therapy   PLAN FOR NEXT SESSION:  manual to L-spine, spinal mobility, hip/core strength  Referring diagnosis?  M54.6 (ICD-10-CM) - Acute right-sided thoracic back pain Treatment diagnosis? (if different than referring diagnosis)  Chronic bilateral low back pain without sciatica Pain in thoracic spine Cramp and spasm What was this (referring dx) caused by? '[]'$  Surgery '[]'$  Fall '[x]'$  Ongoing issue '[x]'$  Arthritis '[]'$  Other: ____________  Laterality: '[]'$  Rt '[]'$  Lt '[x]'$  Both  Check all possible CPT codes:  *CHOOSE 10 OR LESS*    '[x]'$  97110 (Therapeutic Exercise)  '[]'$  92507 (SLP Treatment)  '[x]'$  97112 (Neuro Re-ed)   '[]'$  92526 (Swallowing Treatment)   '[x]'$  97116 (Gait Training)   '[]'$  70623 (Cognitive Training, 1st 15 minutes) '[x]'$  97140 (Manual Therapy)   '[]'$  97130 (Cognitive Training, each add'l 15 minutes)  '[x]'$  97530 (Therapeutic Activities)  '[]'$  Other, List CPT Code ____________    '[x]'$  76283 (Self Care)       '[]'$  All codes above (97110 - 97535)  '[]'$  97012 (Mechanical Traction)  '[]'$  97014 (E-stim Unattended)  '[]'$  97032 (E-stim manual)  '[]'$  97033 (Ionto)  '[]'$  97035 (Ultrasound)  '[]'$  97760 (Orthotic Fit) '[]'$  97750  (Physical Performance Training) '[]'$  [id number] (Aquatic Therapy) '[]'$  97034 (Contrast Bath) '[]'$  [id number] (Paraffin) '[]'$  97597 (Wound Care 1st 20 sq cm) '[]'$  97598 (Wound Care each add'l 20 sq cm) '[]'$  97016 (Vasopneumatic Device) '[]'$  [id number] (Orthotic Training) '[]'$  [id number] (Prosthetic Training)   Ward Chatters, PT 06/11/21 1:26 PM

## 2021-06-18 NOTE — Therapy (Signed)
?OUTPATIENT PHYSICAL THERAPY TREATMENT NOTE ? ? ?Patient Name: [name] ?MRN: [id number] ?DOB:1965/08/17, 56 y.o., female ?Today's Date: 06/19/2021 ? ?PCP: Leeanne Rio, MD ?REFERRING PROVIDER: Leeanne Rio, MD ? ? PT End of Session - 06/19/21 1341   ? ? Visit Number 8   ? Number of Visits 13   ? Date for PT Re-Evaluation 07/23/21   ? Authorization Type [employer] primary; MCD secondary   ? Authorization Time Period 1/23-3/10/23   ? Authorization - Visit Number 8   ? Authorization - Number of Visits 12   ? PT Start Time 1346   ? PT Stop Time 5027   ? PT Time Calculation (min) 31 min   ? ?  ?  ? ?  ? ? ? ? ? ? ? ? ?Past Medical History:  ?Diagnosis Date  ? Allergy   ? Anemia   ? Anxiety   ? Arthritis   ? Blood transfusion without reported diagnosis   ? Cough   ? Cough 06/11/2018  ? Difficulty sleeping 06/11/2018  ? Diverticulitis with perforation 2010  ? DVT (deep vein thrombosis) in pregnancy 11/24/2016  ? DVT, lower extremity ([place])   ? Endometrial cancer ([place])   ? Enlarged lymph nodes 08/13/2017  ? Family history of adverse reaction to anesthesia   ? pts daughter had difficulty awakening following anesthesia, long time to wake up  ? GERD (gastroesophageal reflux disease)   ? Headache   ? Hemoptysis 04/22/2018  ? History of bronchitis   ? History of ear infections   ? Hospital discharge follow-up 03/02/2019  ? Hx of migraines   ? Hyperlipidemia   ? Hypertension   ? IBS (irritable bowel syndrome)   ? Kidney infection   ? Lupus (systemic lupus erythematosus) ([place]) 02/2017  ? Morbid obesity ([place])   ? Neck pain 01/13/2019  ? Ovarian cancer ([place]) dx'd 01/2015  ? PONV (postoperative nausea and vomiting)   ? Port-A-Cath in place   ? Right side  ? Portacath in place 09/12/2015  ? Pre-diabetes   ? Pyelonephritis   ? Right knee pain 11/28/2011  ? Scleroderma ([place])   ? Screen for colon cancer 03/02/2019  ? Slow rate of speech 08/22/2015  ? Chronic from CVA.  She also has loss of left nasolabial fold and slight left  facial droop/small asymmetry on the left side secondary to CVA  ? UTI (urinary tract infection) 05/07/2018  ? ?Past Surgical History:  ?Procedure Laterality Date  ? ABDOMINAL HYSTERECTOMY N/A 03/13/2015  ? Procedure: TOTAL HYSTERECTOMY ABDOMINAL BILATERAL SALPINGO OOPHORECTOMY RADICAL TUMOR [place];  Surgeon: Everitt Amber, MD;  [facility];  Service: Gynecology;  Laterality: N/A;  ? BOWEL RESECTION  03/13/2015  ? Procedure: SMALL BOWEL RESECTION;  Surgeon: Everitt Amber, MD;  [facility];  Service: Gynecology;;  ? [place] and 1984  ? COLONOSCOPY WITH PROPOFOL N/A 07/19/2020  ? Procedure: COLONOSCOPY WITH PROPOFOL;  Surgeon: Doran Stabler, MD;  [facility];  Service: Gastroenterology;  Laterality: N/A;  ? COLOSTOMY  2008  ? COLOSTOMY TAKEDOWN    ? ESOPHAGOGASTRODUODENOSCOPY (EGD) WITH PROPOFOL N/A 12/21/2018  ? Procedure: ESOPHAGOGASTRODUODENOSCOPY (EGD) WITH PROPOFOL;  Surgeon: Doran Stabler, MD;  [facility];  Service: Gastroenterology;  Laterality: N/A;  ? HEMOSTASIS CLIP PLACEMENT  07/19/2020  ? Procedure: HEMOSTASIS CLIP PLACEMENT;  Surgeon: Doran Stabler, MD;  Location[facility];  Service: Gastroenterology;;  ? LAPAROTOMY N/A 03/13/2015  ? Procedure: EXPLORATORY  LAPAROTOMY;  Surgeon: Everitt Amber, MD;  [facility];  Service: Gynecology;  Laterality: N/A;  ? POLYPECTOMY  07/19/2020  ? Procedure: POLYPECTOMY;  Surgeon: Doran Stabler, MD;  Location[facility];  Service: Gastroenterology;;  ? [place]  ? VENTRAL HERNIA REPAIR  03/13/2015  ? Procedure: HERNIA REPAIR VENTRAL ADULT;  Surgeon: Everitt Amber, MD;  [facility];  Service: Gynecology;;  ? ?Patient Active Problem List  ? Diagnosis Date Noted  ? Viral illness 06/05/2021  ? Chronic diastolic CHF (congestive heart failure) ([place]) 07/19/2020  ? Bronchospasm 07/19/2020  ? Incontinence 03/17/2020  ? Herpes simplex virus (HSV) infection of buttock 01/05/2020  ? OSA  (obstructive sleep apnea) 07/25/2019  ? Prediabetes 07/25/2019  ? Lateral epicondylitis of left elbow 01/13/2019  ? Normocytic anemia 05/29/2018  ? Scleroderma ([place]) 04/22/2018  ? Swelling of foot joint, right 04/08/2018  ? Positive ANA (antinuclear antibody) 09/23/2016  ? Eczema 01/27/2016  ? History of pulmonary embolism 09/12/2015  ? Long term current use of anticoagulant therapy 09/06/2015  ? Chemotherapy-induced peripheral neuropathy ([place]) 06/13/2015  ? CKD (chronic kidney disease) stage 3, GFR 30-59 ml/min (HCC) 05/12/2015  ? GERD (gastroesophageal reflux disease) 05/12/2015  ? Ovarian carcinosarcoma, right ([place]) 03/29/2015  ? Endometrial ca [facility]) 03/29/2015  ? Ventral hernia without obstruction or gangrene   ? Morbid obesity ([place]) 02/19/2015  ? Essential hypertension, benign   ? Stress at home 12/14/2014  ? Left knee pain 08/04/2013  ? Seasonal allergies 07/29/2013  ? ? ?REFERRING DIAG:  M54.6 (ICD-10-CM) - Acute right-sided thoracic back pain  ? ?THERAPY DIAG:  ?Chronic bilateral low back pain without sciatica ? ?Pain in thoracic spine ? ?Cramp and spasm ? ?Muscle weakness (generalized) ? ?Unsteadiness on feet ? ?Other abnormalities of gait and mobility ? ?Stiffness of left shoulder, not elsewhere classified ? ?Abnormal posture ? ?Chronic right shoulder pain ? ?Chronic left shoulder pain ? ?Cervicalgia ? ?PERTINENT HISTORY:  ?Currently in remission from endometrial and ovarian cancer  ? ?PRECAUTIONS: None ? ?SUBJECTIVE: I am in so much pain because of  the weather.  I am worse today but I feel better in the water.  I have done water aerobics before at the [facility] ? ? ?Pain: ?Are you having pain? Yes ?NPRS scale: 7/10 today because of cold weather ?Pain location: lower back and bilateral LE ?PAIN TYPE: aching ?Pain description: intermittent  ?Aggravating factors: pulling, lifting, reaching, prolonged standing ?Relieving factors: rest  ? ? ?OBJECTIVE:  ? *Unless otherwise noted all objective measures were captured  on initial evaluation.  ? ?DIAGNOSTIC FINDINGS:  ?N/A ?  ?PATIENT SURVEYS:  ?FOTO 36% function to 56% predicted  ?05/30/21: 52% function  ?  ?LUMBARAROM/PROM ?  ?A/PROM A/PROM  ?04/29/2021  ?Flexion Full; increased pain low back  ?Extension Full; decreases pain   ?Right lateral flexion 25% limited   ?Left lateral flexion 50% limited, pulling Rt side low back  ?Right rotation 25% limited   ?Left rotation 50% limited pulling Rt side low back   ? (Blank rows = not tested) ?  ?  ?LE MMT: ?  ?MMT Right ?04/29/2021 Left ?04/29/2021  ?Hip flexion 4; pn low back  4+  ?Hip extension      ?Hip abduction 3+; pn low back  3+  ?Hip adduction      ?Hip internal rotation      ?Hip external rotation      ?Knee flexion      ?Knee extension      ?  Ankle dorsiflexion      ?Ankle plantarflexion      ?Ankle inversion      ?Ankle eversion      ? (Blank rows = not tested) ?  ?FUNCTIONAL TESTS:  ?5 times sit to stand: 14 seconds ?   ?  ?TODAY'S TREATMENT  ?[facility] Adult PT Treatment:                                                DATE: 06-19-21 ?Arrives about 15 min late to Aquatic therapy at [address] - therapeutic pool temp 92 degrees ?Pt enters building with SPC.  Treatment took place in water 3.8 to  4 ft 8 in.feet deep depending upon activity.  Pt entered and exited the pool via stair and handrails independently ? ?[name] entered water for aquatic therapy for first time in 8 months (pt was doing aquatics at [facility] in [place])  and was introduced to principles and therapeutic effects of water as she ambulated and acclimated to pool.  ?Forward, backwards, side stepping and marching ?  ?Pt educated on neutral posture and hip hinging in seated position with water at chest level x 10 with stretch to low back and then x 10 with back at pool wall at external cue, VC for neck tucked to prevent hyperextension. ?  ? Runners stretch  2 x 30 sec each Left and then moves into hamstring stretch  Then runners stretch on R  2 x 30  sec and then move into hamstring stretch . TC to insure proper technique. ?Figure 4 squat stretch with UE support for R and L x 60 sec each. ? ?Pt was interested in taking an HEP to local pool or to utilize as

## 2021-06-19 ENCOUNTER — Other Ambulatory Visit: Payer: Self-pay

## 2021-06-19 ENCOUNTER — Encounter: Payer: Self-pay | Admitting: Physical Therapy

## 2021-06-19 ENCOUNTER — Ambulatory Visit: Payer: Medicare HMO | Admitting: Physical Therapy

## 2021-06-19 DIAGNOSIS — R2689 Other abnormalities of gait and mobility: Secondary | ICD-10-CM

## 2021-06-19 DIAGNOSIS — R252 Cramp and spasm: Secondary | ICD-10-CM

## 2021-06-19 DIAGNOSIS — M542 Cervicalgia: Secondary | ICD-10-CM

## 2021-06-19 DIAGNOSIS — R293 Abnormal posture: Secondary | ICD-10-CM

## 2021-06-19 DIAGNOSIS — M6281 Muscle weakness (generalized): Secondary | ICD-10-CM

## 2021-06-19 DIAGNOSIS — M25612 Stiffness of left shoulder, not elsewhere classified: Secondary | ICD-10-CM

## 2021-06-19 DIAGNOSIS — M545 Low back pain, unspecified: Secondary | ICD-10-CM

## 2021-06-19 DIAGNOSIS — G8929 Other chronic pain: Secondary | ICD-10-CM

## 2021-06-19 DIAGNOSIS — R2681 Unsteadiness on feet: Secondary | ICD-10-CM

## 2021-06-19 DIAGNOSIS — M546 Pain in thoracic spine: Secondary | ICD-10-CM

## 2021-06-19 NOTE — Patient Instructions (Addendum)
Access Code: [email or web address]  ?URL: [email or web address] ?Date: 06/18/2021 ?Prepared by: Voncille Lo ? ?Exercises ?Gastroc Stretch with Foot at Wall - 1 x daily - 2 x weekly - 1 reps - 30 seconds hold ?Forward Walking - 1 x daily - 2 x weekly - 4 sets ?Backward Walking - 1 x daily - 2 x weekly - 4 sets ?Forward March - 1 x daily - 2 x weekly - 4 sets ?Forward March with Opposite Arm Knee Taps and Hand Floats - 1 x daily - 2 x weekly - 4 sets ?Backward March with Opposite Arm Knee Taps and Hand Floats - 1 x daily - 2 x weekly - 4 sets ?Standing March at [place] 1 x daily - 2 x weekly - 15 reps ?Standing Hip Flexion Extension at [employer] - 1 x daily - 2 x weekly - 15 reps ?Standing Hip Abduction Adduction at Pool Wall - 1 x daily - 2 x weekly - 15 reps ?Squat - 1 x daily - 2 x weekly - 15 reps ?Single Leg Squat - 1 x daily - 2 x weekly - 15 reps ?Cat Cow in [place] with Pool Noodle - 1 x daily - 1-3 x weekly - 1 sets - 5-10 reps ?Full Triangle Pose in [employer] with Pool Noodle - 1 x daily - 1-3 x weekly - 1 sets - 5 reps - 15-30 hold ?Lunge to Target at [facility] - 1 x daily - 1-3 x weekly - 2 sets - 10 reps ?Forward Walking - 1 x daily - 2 x weekly - 4 sets ?Backward Walking - 1 x daily - 2 x weekly - 4 sets ?Single Leg Squat - 1 x daily - 2 x weekly - 15 reps ? ? ? ? ?Voncille Lo, PT, [place] ?Certified Exercise Expert for the Aging Adult  ?06/19/21 4:42 PM ?Phone: [phone] ?Fax: [phone]  ?

## 2021-06-20 ENCOUNTER — Ambulatory Visit: Payer: Medicare HMO

## 2021-06-20 DIAGNOSIS — M545 Low back pain, unspecified: Secondary | ICD-10-CM | POA: Diagnosis not present

## 2021-06-20 NOTE — Therapy (Signed)
?OUTPATIENT PHYSICAL THERAPY TREATMENT NOTE ? ? ?Patient Name: [name] ?MRN: [id number] ?DOB:[birth year], 56 y.o., female ?Today's Date: 06/20/2021 ? ?PCP: Leeanne Rio, MD ?REFERRING PROVIDER: Leeanne Rio, MD ? ? PT End of Session - 06/20/21 1133   ? ? Visit Number 9   ? Number of Visits 13   ? Date for PT Re-Evaluation 07/23/21   ? Authorization Type [employer] primary; MCD secondary   ? Authorization Time Period 1/23-3/10/23   ? Authorization - Visit Number 2   ? Authorization - Number of Visits 12   ? PT Start Time 1134   ? PT Stop Time 1210   ? PT Time Calculation (min) 36 min   ? ?  ?  ? ?  ? ? ? ? ? ? ? ?Past Medical History:  ?Diagnosis Date  ? Allergy   ? Anemia   ? Anxiety   ? Arthritis   ? Blood transfusion without reported diagnosis   ? Cough   ? Cough 06/11/2018  ? Difficulty sleeping 06/11/2018  ? Diverticulitis with perforation 2010  ? DVT (deep vein thrombosis) in pregnancy 11/24/2016  ? DVT, lower extremity ([place])   ? Endometrial cancer ([place])   ? Enlarged lymph nodes 08/13/2017  ? Family history of adverse reaction to anesthesia   ? pts daughter had difficulty awakening following anesthesia, long time to wake up  ? GERD (gastroesophageal reflux disease)   ? Headache   ? Hemoptysis 04/22/2018  ? History of bronchitis   ? History of ear infections   ? Hospital discharge follow-up 03/02/2019  ? Hx of migraines   ? Hyperlipidemia   ? Hypertension   ? IBS (irritable bowel syndrome)   ? Kidney infection   ? Lupus (systemic lupus erythematosus) ([place]) 02/2017  ? Morbid obesity ([place])   ? Neck pain 01/13/2019  ? Ovarian cancer ([place]) dx'd 01/2015  ? PONV (postoperative nausea and vomiting)   ? Port-A-Cath in place   ? Right side  ? Portacath in place 09/12/2015  ? Pre-diabetes   ? Pyelonephritis   ? Right knee pain 11/28/2011  ? Scleroderma ([place])   ? Screen for colon cancer 03/02/2019  ? Slow rate of speech 08/22/2015  ? Chronic from CVA.  She also has loss of left nasolabial fold and slight left facial  droop/small asymmetry on the left side secondary to CVA  ? UTI (urinary tract infection) 05/07/2018  ? ?Past Surgical History:  ?Procedure Laterality Date  ? ABDOMINAL HYSTERECTOMY N/A 03/13/2015  ? Procedure: TOTAL HYSTERECTOMY ABDOMINAL BILATERAL SALPINGO OOPHORECTOMY RADICAL TUMOR [place];  Surgeon: Everitt Amber, MD;  Location: [facility];  Service: Gynecology;  Laterality: N/A;  ? BOWEL RESECTION  03/13/2015  ? Procedure: SMALL BOWEL RESECTION;  Surgeon: Everitt Amber, MD;  Location: [facility];  Service: Gynecology;;  ? [place] and 1984  ? COLONOSCOPY WITH PROPOFOL N/A 07/19/2020  ? Procedure: COLONOSCOPY WITH PROPOFOL;  Surgeon: Doran Stabler, MD;  [facility];  Service: Gastroenterology;  Laterality: N/A;  ? COLOSTOMY  2008  ? COLOSTOMY TAKEDOWN    ? ESOPHAGOGASTRODUODENOSCOPY (EGD) WITH PROPOFOL N/A 12/21/2018  ? Procedure: ESOPHAGOGASTRODUODENOSCOPY (EGD) WITH PROPOFOL;  Surgeon: Doran Stabler, MD;  [facility];  Service: Gastroenterology;  Laterality: N/A;  ? HEMOSTASIS CLIP PLACEMENT  07/19/2020  ? Procedure: HEMOSTASIS CLIP PLACEMENT;  Surgeon: Doran Stabler, MD;  Location: [facility];  Service: Gastroenterology;;  ? LAPAROTOMY N/A 03/13/2015  ? Procedure: EXPLORATORY LAPAROTOMY;  Surgeon: Everitt Amber, MD;  [facility];  Service: Gynecology;  Laterality: N/A;  ? POLYPECTOMY  07/19/2020  ? Procedure: POLYPECTOMY;  Surgeon: Doran Stabler, MD;  Location: [facility];  Service: Gastroenterology;;  ? [place]  ? VENTRAL HERNIA REPAIR  03/13/2015  ? Procedure: HERNIA REPAIR VENTRAL ADULT;  Surgeon: Everitt Amber, MD;  [facility];  Service: Gynecology;;  ? ?Patient Active Problem List  ? Diagnosis Date Noted  ? Viral illness 06/05/2021  ? Chronic diastolic CHF (congestive heart failure) ([place]) 07/19/2020  ? Bronchospasm 07/19/2020  ? Incontinence 03/17/2020  ? Herpes simplex virus (HSV) infection of buttock 01/05/2020  ? OSA (obstructive  sleep apnea) 07/25/2019  ? Prediabetes 07/25/2019  ? Lateral epicondylitis of left elbow 01/13/2019  ? Normocytic anemia 05/29/2018  ? Scleroderma ([place]) 04/22/2018  ? Swelling of foot joint, right 04/08/2018  ? Positive ANA (antinuclear antibody) 09/23/2016  ? Eczema 01/27/2016  ? History of pulmonary embolism 09/12/2015  ? Long term current use of anticoagulant therapy 09/06/2015  ? Chemotherapy-induced peripheral neuropathy ([place]) 06/13/2015  ? CKD (chronic kidney disease) stage 3, GFR 30-59 ml/min (HCC) 05/12/2015  ? GERD (gastroesophageal reflux disease) 05/12/2015  ? Ovarian carcinosarcoma, right ([place]) 03/29/2015  ? Endometrial ca [facility]) 03/29/2015  ? Ventral hernia without obstruction or gangrene   ? Morbid obesity ([place]) 02/19/2015  ? Essential hypertension, benign   ? Stress at home 12/14/2014  ? Left knee pain 08/04/2013  ? Seasonal allergies 07/29/2013  ? ? ?REFERRING DIAG:  M54.6 (ICD-10-CM) - Acute right-sided thoracic back pain  ? ?THERAPY DIAG:  ?Chronic bilateral low back pain without sciatica ? ?Pain in thoracic spine ? ?PERTINENT HISTORY:  ?Currently in remission from endometrial and ovarian cancer  ? ?PRECAUTIONS: None ? ?SUBJECTIVE:  ?Pt presents to PT with reports of bilateral LE pain. Notes she was sick last week and was unable to complete HEP. Pt is ready to begin PT at this time. ? ?Pain: ?Are you having pain? Yes ?NPRS scale: 5/10 ?Pain location: lower back and bilateral LE ?PAIN TYPE: aching ?Pain description: intermittent  ?Aggravating factors: pulling, lifting, reaching, prolonged standing ?Relieving factors: rest  ? ? ?OBJECTIVE:  ? *Unless otherwise noted all objective measures were captured on initial evaluation.  ? ?DIAGNOSTIC FINDINGS:  ?N/A ?  ?PATIENT SURVEYS:  ?FOTO 36% function to 56% predicted  ?05/30/21: 52% function  ?  ?LUMBARAROM/PROM ?  ?A/PROM A/PROM  ?04/29/2021  ?Flexion Full; increased pain low back  ?Extension Full; decreases pain   ?Right lateral flexion 25% limited   ?Left  lateral flexion 50% limited, pulling Rt side low back  ?Right rotation 25% limited   ?Left rotation 50% limited pulling Rt side low back   ? (Blank rows = not tested) ?  ?  ?LE MMT: ?  ?MMT Right ?04/29/2021 Left ?04/29/2021  ?Hip flexion 4; pn low back  4+  ?Hip extension      ?Hip abduction 3+; pn low back  3+  ?Hip adduction      ?Hip internal rotation      ?Hip external rotation      ?Knee flexion      ?Knee extension      ?Ankle dorsiflexion      ?Ankle plantarflexion      ?Ankle inversion      ?Ankle eversion      ? (Blank rows = not tested) ?  ?FUNCTIONAL TESTS:  ?5 times sit to stand: 14 seconds ?   ?  ?  TODAY'S TREATMENT  ?[facility] Adult PT Treatment:                                                DATE: 06/20/2021 ?Therapeutic Exercise: ?NuStep lvl 5 UE/LE x 3 min while taking subjective ?Seated physioball rollout x 15 - 5" hold ?Supine PPT with ball squeeze 2x10 - 5" hold ?Supine PPT clamshell 3x15 Black TB ?Supine PPT march 2x20 Black TB ?LTR x 10 ?Supine SLR 2x15 (small range with pulse) ?Abdominal curl up 3x10 ?Seated row 3x15 Black TB ?Seated horizontal abd 3x15 GTB ?Seated diagonals 2x15 GTB each ?LAQ 2x10 5# ?Seated march 2x20 5# ?STS 2x10 - elevated table  ? ?PATIENT EDUCATION:  ?Education details: see treatment above  ?Person educated: Patient ?Education method: Explanation, Demonstration, Tactile cues, Verbal cues, and Handouts ?Education comprehension: verbalized understanding, returned demonstration, verbal cues required, tactile cues required, and needs further education ?  ?  ?HOME EXERCISE PROGRAM: ?Access Code: [email or web address] ?URL: [email or web address] ?Date: 05/30/2021 ?Prepared by: Gwendolyn Grant ? ?Exercises ?Supine Lower Trunk Rotation - 2 x daily - 7 x weekly - 2 sets - 10 reps ?Supine Pelvic Tilt - 2 x daily - 7 x weekly - 2 sets - 10 reps ?Supine Figure 4 Piriformis Stretch - 2 x daily - 7 x weekly - 3 sets - 30 hold ?Standing Shoulder Row with Anchored Resistance - 1 x daily - 7 x  weekly - 3 sets - 10 reps ?Hooklying Single Leg Bent Knee Fallouts with Resistance - 1 x daily - 7 x weekly - 3 sets - 20 reps - blue exercise band hold ?Sidelying Thoracic Rotation with Open Book - 1

## 2021-06-24 ENCOUNTER — Other Ambulatory Visit: Payer: Self-pay | Admitting: Nurse Practitioner

## 2021-06-26 ENCOUNTER — Other Ambulatory Visit: Payer: Self-pay

## 2021-06-26 ENCOUNTER — Ambulatory Visit (HOSPITAL_BASED_OUTPATIENT_CLINIC_OR_DEPARTMENT_OTHER): Payer: Medicare HMO | Attending: Family Medicine | Admitting: Physical Therapy

## 2021-06-26 ENCOUNTER — Encounter (HOSPITAL_BASED_OUTPATIENT_CLINIC_OR_DEPARTMENT_OTHER): Payer: Self-pay | Admitting: Physical Therapy

## 2021-06-26 DIAGNOSIS — R252 Cramp and spasm: Secondary | ICD-10-CM | POA: Diagnosis present

## 2021-06-26 DIAGNOSIS — M545 Low back pain, unspecified: Secondary | ICD-10-CM | POA: Insufficient documentation

## 2021-06-26 DIAGNOSIS — M6281 Muscle weakness (generalized): Secondary | ICD-10-CM | POA: Insufficient documentation

## 2021-06-26 DIAGNOSIS — G8929 Other chronic pain: Secondary | ICD-10-CM | POA: Diagnosis present

## 2021-06-26 DIAGNOSIS — M546 Pain in thoracic spine: Secondary | ICD-10-CM | POA: Diagnosis present

## 2021-06-26 NOTE — Therapy (Signed)
?OUTPATIENT PHYSICAL THERAPY TREATMENT NOTE ? ? ?Patient Name: [name] ?MRN: [id number] ?DOB:[birth year], 56 y.o., female ?Today's Date: 06/26/2021 ? ?PCP: Leeanne Rio, MD ?REFERRING PROVIDER: Rise Patience, DO ? ? PT End of Session - 06/26/21 1337   ? ? Visit Number 10   ? Number of Visits 13   ? Date for PT Re-Evaluation 07/23/21   ? Authorization Type [employer] primary; MCD secondary   ? Authorization Time Period 1/23-3/10/23   ? Authorization - Visit Number 2   ? Authorization - Number of Visits 12   ? PT Start Time 1336   ? PT Stop Time 7741   ? PT Time Calculation (min) 43 min   ? Activity Tolerance Patient tolerated treatment well   ? ?  ?  ? ?  ? ? ? ? ? ? ? ?Past Medical History:  ?Diagnosis Date  ? Allergy   ? Anemia   ? Anxiety   ? Arthritis   ? Blood transfusion without reported diagnosis   ? Cough   ? Cough 06/11/2018  ? Difficulty sleeping 06/11/2018  ? Diverticulitis with perforation 2010  ? DVT (deep vein thrombosis) in pregnancy 11/24/2016  ? DVT, lower extremity ([place])   ? Endometrial cancer ([place])   ? Enlarged lymph nodes 08/13/2017  ? Family history of adverse reaction to anesthesia   ? pts daughter had difficulty awakening following anesthesia, long time to wake up  ? GERD (gastroesophageal reflux disease)   ? Headache   ? Hemoptysis 04/22/2018  ? History of bronchitis   ? History of ear infections   ? Hospital discharge follow-up 03/02/2019  ? Hx of migraines   ? Hyperlipidemia   ? Hypertension   ? IBS (irritable bowel syndrome)   ? Kidney infection   ? Lupus (systemic lupus erythematosus) ([place]) 02/2017  ? Morbid obesity ([place])   ? Neck pain 01/13/2019  ? Ovarian cancer ([place]) dx'd 01/2015  ? PONV (postoperative nausea and vomiting)   ? Port-A-Cath in place   ? Right side  ? Portacath in place 09/12/2015  ? Pre-diabetes   ? Pyelonephritis   ? Right knee pain 11/28/2011  ? Scleroderma ([place])   ? Screen for colon cancer 03/02/2019  ? Slow rate of speech 08/22/2015  ? Chronic from CVA.  She also has  loss of left nasolabial fold and slight left facial droop/small asymmetry on the left side secondary to CVA  ? UTI (urinary tract infection) 05/07/2018  ? ?Past Surgical History:  ?Procedure Laterality Date  ? ABDOMINAL HYSTERECTOMY N/A 03/13/2015  ? Procedure: TOTAL HYSTERECTOMY ABDOMINAL BILATERAL SALPINGO OOPHORECTOMY RADICAL TUMOR [place];  Surgeon: Everitt Amber, MD;  [facility];  Service: Gynecology;  Laterality: N/A;  ? BOWEL RESECTION  03/13/2015  ? Procedure: SMALL BOWEL RESECTION;  Surgeon: Everitt Amber, MD;  [facility]: [facility];  Service: Gynecology;;  ? [place] and 1984  ? COLONOSCOPY WITH PROPOFOL N/A 07/19/2020  ? Procedure: COLONOSCOPY WITH PROPOFOL;  Surgeon: Doran Stabler, MD;  [facility];  Service: Gastroenterology;  Laterality: N/A;  ? COLOSTOMY  2008  ? COLOSTOMY TAKEDOWN    ? ESOPHAGOGASTRODUODENOSCOPY (EGD) WITH PROPOFOL N/A 12/21/2018  ? Procedure: ESOPHAGOGASTRODUODENOSCOPY (EGD) WITH PROPOFOL;  Surgeon: Doran Stabler, MD;  [facility];  Service: Gastroenterology;  Laterality: N/A;  ? HEMOSTASIS CLIP PLACEMENT  07/19/2020  ? Procedure: HEMOSTASIS CLIP PLACEMENT;  Surgeon: Doran Stabler, MD;  Location: [facility];  Service: Gastroenterology;;  ?  LAPAROTOMY N/A 03/13/2015  ? Procedure: EXPLORATORY LAPAROTOMY;  Surgeon: Everitt Amber, MD;  [facility];  Service: Gynecology;  Laterality: N/A;  ? POLYPECTOMY  07/19/2020  ? Procedure: POLYPECTOMY;  Surgeon: Doran Stabler, MD;  Location: [facility];  Service: Gastroenterology;;  ? [place]  ? VENTRAL HERNIA REPAIR  03/13/2015  ? Procedure: HERNIA REPAIR VENTRAL ADULT;  Surgeon: Everitt Amber, MD;  [facility];  Service: Gynecology;;  ? ?Patient Active Problem List  ? Diagnosis Date Noted  ? Viral illness 06/05/2021  ? Chronic diastolic CHF (congestive heart failure) ([place]) 07/19/2020  ? Bronchospasm 07/19/2020  ? Incontinence 03/17/2020  ? Herpes simplex virus (HSV)  infection of buttock 01/05/2020  ? OSA (obstructive sleep apnea) 07/25/2019  ? Prediabetes 07/25/2019  ? Lateral epicondylitis of left elbow 01/13/2019  ? Normocytic anemia 05/29/2018  ? Scleroderma ([place]) 04/22/2018  ? Swelling of foot joint, right 04/08/2018  ? Positive ANA (antinuclear antibody) 09/23/2016  ? Eczema 01/27/2016  ? History of pulmonary embolism 09/12/2015  ? Long term current use of anticoagulant therapy 09/06/2015  ? Chemotherapy-induced peripheral neuropathy ([place]) 06/13/2015  ? CKD (chronic kidney disease) stage 3, GFR 30-59 ml/min (HCC) 05/12/2015  ? GERD (gastroesophageal reflux disease) 05/12/2015  ? Ovarian carcinosarcoma, right ([place]) 03/29/2015  ? Endometrial ca [facility]) 03/29/2015  ? Ventral hernia without obstruction or gangrene   ? Morbid obesity ([place]) 02/19/2015  ? Essential hypertension, benign   ? Stress at home 12/14/2014  ? Left knee pain 08/04/2013  ? Seasonal allergies 07/29/2013  ? ? ?REFERRING DIAG:  M54.6 (ICD-10-CM) - Acute right-sided thoracic back pain  ? ?THERAPY DIAG:  ?Chronic bilateral low back pain without sciatica ? ?Pain in thoracic spine ? ?Cramp and spasm ? ?Muscle weakness (generalized) ? ?PERTINENT HISTORY:  ?Currently in remission from endometrial and ovarian cancer  ? ?PRECAUTIONS: None ? ?SUBJECTIVE: "Feeling pretty good, pain is still there" ? ? ?Pain: ?Are you having pain? Yes ?NPRS scale: 5/10 ?Pain location: lower back and bilateral LE ?PAIN TYPE: aching ?Pain description: intermittent  ?Aggravating factors: pulling, lifting, reaching, prolonged standing ?Relieving factors: rest  ? ? ?OBJECTIVE:  ? *Unless otherwise noted all objective measures were captured on initial evaluation.  ? ?DIAGNOSTIC FINDINGS:  ?N/A ?  ?PATIENT SURVEYS:  ?FOTO 36% function to 56% predicted  ?05/30/21: 52% function  ?  ?LUMBARAROM/PROM ?  ?A/PROM A/PROM  ?04/29/2021  ?Flexion Full; increased pain low back  ?Extension Full; decreases pain   ?Right lateral flexion 25% limited   ?Left  lateral flexion 50% limited, pulling Rt side low back  ?Right rotation 25% limited   ?Left rotation 50% limited pulling Rt side low back   ? (Blank rows = not tested) ?  ?  ?LE MMT: ?  ?MMT Right ?04/29/2021 Left ?04/29/2021  ?Hip flexion 4; pn low back  4+  ?Hip extension      ?Hip abduction 3+; pn low back  3+  ?Hip adduction      ?Hip internal rotation      ?Hip external rotation      ?Knee flexion      ?Knee extension      ?Ankle dorsiflexion      ?Ankle plantarflexion      ?Ankle inversion      ?Ankle eversion      ? (Blank rows = not tested) ?  ?FUNCTIONAL TESTS:  ?5 times sit to stand: 14 seconds ?   ?  ?TODAY'S TREATMENT  ? ?  [facility] Adult PT Treatment:                                                DATE: 06/26/2021 ?Therapeutic Exercise: ? ?Pt seen for aquatic therapy today.  Treatment took place in water 3.25-4.8 ft in depth at the [employer] pool. Temp of water was 92?.  Pt entered/exited the pool via stairs  step to pattern independently with bilat rail. ? ?Gastroc Stretch and hamstring stretch seated 3x20s hold ?Forward Walking ; backward and side stepping 4 widths ea ?Flutter kicking then  add/abd 3x20rep seated with abdominal bracing  ?Lumbar stretch in hip hinge position with kick board 5x 20s hold followed by rotation R/L x5 ?Figure 4 hip stretch rotation and piriformis 3x 20s hold ?Standing March  ue supported by yellow hand buoys x15 ?Standing Hip Flexion Extension ue supported by yellow hand buoys x15 ?Standing Hip Abduction Adduction  ue supported by yellow hand buoys x10 ?Squat 2x20 bottom of pool  ue supported by yellow hand buoys ?Single Leg knee extension 2x10 R/L ? ?Pt requires the buoyancy of water for active assisted exercises with buoyancy supported for strengthening and AROM exercises: PT  requires the viscosity of the water for resistance with strengthening exercises ?Hydrostatic pressure also supports joints by unweighting joint load by at least 50 % in 3-4 feet depth water. 80% in  chest to neck deep water. ?Water will allow for  reduced joint loading through buoyancy to help patient  ? ? ? ?[facility] Adult PT Treatment:                                                DATE: 06/20/2021 ?Terie Purser

## 2021-06-27 ENCOUNTER — Ambulatory Visit: Payer: Medicare HMO

## 2021-06-27 DIAGNOSIS — M546 Pain in thoracic spine: Secondary | ICD-10-CM

## 2021-06-27 DIAGNOSIS — M545 Low back pain, unspecified: Secondary | ICD-10-CM | POA: Diagnosis not present

## 2021-06-27 DIAGNOSIS — G8929 Other chronic pain: Secondary | ICD-10-CM

## 2021-06-27 NOTE — Therapy (Signed)
?OUTPATIENT PHYSICAL THERAPY TREATMENT NOTE ? ? ?Patient Name: [name] ?MRN: [id number] ?DOB:Aug 14, 1965, 55 y.o., female ?Today's Date: 06/27/2021 ? ?PCP: Leeanne Rio, MD ?REFERRING PROVIDER: Rise Patience, DO ? ? PT End of Session - 06/27/21 1127   ? ? Visit Number 11   ? Number of Visits 13   ? Date for PT Re-Evaluation 07/23/21   ? Authorization Type [employer] primary; MCD secondary   ? Authorization Time Period 1/23-3/10/23   ? Authorization - Visit Number 3   ? Authorization - Number of Visits 12   ? PT Start Time 1130   ? PT Stop Time 1210   ? PT Time Calculation (min) 40 min   ? Activity Tolerance Patient tolerated treatment well   ? ?  ?  ? ?  ? ? ? ? ? ? ? ? ?Past Medical History:  ?Diagnosis Date  ? Allergy   ? Anemia   ? Anxiety   ? Arthritis   ? Blood transfusion without reported diagnosis   ? Cough   ? Cough 06/11/2018  ? Difficulty sleeping 06/11/2018  ? Diverticulitis with perforation 2010  ? DVT (deep vein thrombosis) in pregnancy 11/24/2016  ? DVT, lower extremity ([place])   ? Endometrial cancer ([place])   ? Enlarged lymph nodes 08/13/2017  ? Family history of adverse reaction to anesthesia   ? pts daughter had difficulty awakening following anesthesia, long time to wake up  ? GERD (gastroesophageal reflux disease)   ? Headache   ? Hemoptysis 04/22/2018  ? History of bronchitis   ? History of ear infections   ? Hospital discharge follow-up 03/02/2019  ? Hx of migraines   ? Hyperlipidemia   ? Hypertension   ? IBS (irritable bowel syndrome)   ? Kidney infection   ? Lupus (systemic lupus erythematosus) ([place]) 02/2017  ? Morbid obesity ([place])   ? Neck pain 01/13/2019  ? Ovarian cancer ([place]) dx'd 01/2015  ? PONV (postoperative nausea and vomiting)   ? Port-A-Cath in place   ? Right side  ? Portacath in place 09/12/2015  ? Pre-diabetes   ? Pyelonephritis   ? Right knee pain 11/28/2011  ? Scleroderma ([place])   ? Screen for colon cancer 03/02/2019  ? Slow rate of speech 08/22/2015  ? Chronic from CVA.  She also  has loss of left nasolabial fold and slight left facial droop/small asymmetry on the left side secondary to CVA  ? UTI (urinary tract infection) 05/07/2018  ? ?Past Surgical History:  ?Procedure Laterality Date  ? ABDOMINAL HYSTERECTOMY N/A 03/13/2015  ? Procedure: TOTAL HYSTERECTOMY ABDOMINAL BILATERAL SALPINGO OOPHORECTOMY RADICAL TUMOR [place];  Surgeon: Everitt Amber, MD;  Location: [facility];  Service: Gynecology;  Laterality: N/A;  ? BOWEL RESECTION  03/13/2015  ? Procedure: SMALL BOWEL RESECTION;  Surgeon: Everitt Amber, MD;  Location: [facility];  Service: Gynecology;;  ? [place] and 1984  ? COLONOSCOPY WITH PROPOFOL N/A 07/19/2020  ? Procedure: COLONOSCOPY WITH PROPOFOL;  Surgeon: Doran Stabler, MD;  [facility];  Service: Gastroenterology;  Laterality: N/A;  ? COLOSTOMY  2008  ? COLOSTOMY TAKEDOWN    ? ESOPHAGOGASTRODUODENOSCOPY (EGD) WITH PROPOFOL N/A 12/21/2018  ? Procedure: ESOPHAGOGASTRODUODENOSCOPY (EGD) WITH PROPOFOL;  Surgeon: Doran Stabler, MD;  [facility];  Service: Gastroenterology;  Laterality: N/A;  ? HEMOSTASIS CLIP PLACEMENT  07/19/2020  ? Procedure: HEMOSTASIS CLIP PLACEMENT;  Surgeon: Doran Stabler, MD;  Location: [facility];  Service: Gastroenterology;;  ?  LAPAROTOMY N/A 03/13/2015  ? Procedure: EXPLORATORY LAPAROTOMY;  Surgeon: Everitt Amber, MD;  [facility];  Service: Gynecology;  Laterality: N/A;  ? POLYPECTOMY  07/19/2020  ? Procedure: POLYPECTOMY;  Surgeon: Doran Stabler, MD;  Location: [facility];  Service: Gastroenterology;;  ? [place]  ? VENTRAL HERNIA REPAIR  03/13/2015  ? Procedure: HERNIA REPAIR VENTRAL ADULT;  Surgeon: Everitt Amber, MD;  [facility];  Service: Gynecology;;  ? ?Patient Active Problem List  ? Diagnosis Date Noted  ? Viral illness 06/05/2021  ? Chronic diastolic CHF (congestive heart failure) ([place]) 07/19/2020  ? Bronchospasm 07/19/2020  ? Incontinence 03/17/2020  ? Herpes simplex virus  (HSV) infection of buttock 01/05/2020  ? OSA (obstructive sleep apnea) 07/25/2019  ? Prediabetes 07/25/2019  ? Lateral epicondylitis of left elbow 01/13/2019  ? Normocytic anemia 05/29/2018  ? Scleroderma ([place]) 04/22/2018  ? Swelling of foot joint, right 04/08/2018  ? Positive ANA (antinuclear antibody) 09/23/2016  ? Eczema 01/27/2016  ? History of pulmonary embolism 09/12/2015  ? Long term current use of anticoagulant therapy 09/06/2015  ? Chemotherapy-induced peripheral neuropathy ([place]) 06/13/2015  ? CKD (chronic kidney disease) stage 3, GFR 30-59 ml/min (HCC) 05/12/2015  ? GERD (gastroesophageal reflux disease) 05/12/2015  ? Ovarian carcinosarcoma, right ([place]) 03/29/2015  ? Endometrial ca [facility]) 03/29/2015  ? Ventral hernia without obstruction or gangrene   ? Morbid obesity ([place]) 02/19/2015  ? Essential hypertension, benign   ? Stress at home 12/14/2014  ? Left knee pain 08/04/2013  ? Seasonal allergies 07/29/2013  ? ? ?REFERRING DIAG:  M54.6 (ICD-10-CM) - Acute right-sided thoracic back pain  ? ?THERAPY DIAG:  ?Chronic bilateral low back pain without sciatica ? ?Pain in thoracic spine ? ?PERTINENT HISTORY:  ?Currently in remission from endometrial and ovarian cancer  ? ?PRECAUTIONS: None ? ?SUBJECTIVE:  ?Pt presents to PT with reports of continued lower back and LE pain. Had aquatic therapy yesterday and noted some muscle fatigue post session. Pt is ready to begin PT treatment at this time.  ? ?Pain: ?Are you having pain? Yes ?NPRS scale: 5/10 ?Pain location: lower back and bilateral LE ?PAIN TYPE: aching ?Pain description: intermittent  ?Aggravating factors: pulling, lifting, reaching, prolonged standing ?Relieving factors: rest  ? ? ?OBJECTIVE:  ? *Unless otherwise noted all objective measures were captured on initial evaluation.  ? ?DIAGNOSTIC FINDINGS:  ?N/A ?  ?PATIENT SURVEYS:  ?FOTO 36% function to 56% predicted  ?05/30/21: 52% function  ?  ?LUMBARAROM/PROM ?  ?A/PROM A/PROM  ?04/29/2021  ?Flexion Full;  increased pain low back  ?Extension Full; decreases pain   ?Right lateral flexion 25% limited   ?Left lateral flexion 50% limited, pulling Rt side low back  ?Right rotation 25% limited   ?Left rotation 50% limited pulling Rt side low back   ? (Blank rows = not tested) ?  ?  ?LE MMT: ?  ?MMT Right ?04/29/2021 Left ?04/29/2021  ?Hip flexion 4; pn low back  4+  ?Hip extension      ?Hip abduction 3+; pn low back  3+  ?Hip adduction      ?Hip internal rotation      ?Hip external rotation      ?Knee flexion      ?Knee extension      ?Ankle dorsiflexion      ?Ankle plantarflexion      ?Ankle inversion      ?Ankle eversion      ? (Blank rows = not tested) ?  ?  FUNCTIONAL TESTS:  ?5 times sit to stand: 14 seconds ?   ?  ?TODAY'S TREATMENT  ?[facility] Adult PT Treatment:                                                DATE: 06/27/2021 ?Therapeutic Exercise: ?NuStep lvl 5 UE/LE x 3 min while taking subjective ?Seated physioball rollout x 15 - 5" hold ?Supine PPT with ball squeeze 2x10 - 5" hold ?Supine PPT clamshell 3x15 Black TB ?Supine PPT march 2x20 Black TB ?LTR x 10 ?Supine SLR 2x15 (small range with pulse) ?Abdominal curl up 3x10 ?Seated row 3x15 Black TB ?Seated horizontal abd 2x15 GTB ?LAQ 2x15 5# ?Seated march 2x20 5# ?STS 2x10 - elevated table  ?Standing hip abd 2x10 25#  ? ?[facility] Adult PT Treatment:                                                DATE: 06/20/2021 ?Therapeutic Exercise: ?NuStep lvl 5 UE/LE x 3 min while taking subjective ?Seated physioball rollout x 15 - 5" hold ?Supine PPT with ball squeeze 2x10 - 5" hold ?Supine PPT clamshell 3x15 Black TB ?Supine PPT march 2x20 Black TB ?LTR x 10 ?Supine SLR 2x15 (small range with pulse) ?Abdominal curl up 3x10 ?Seated row 3x15 Black TB ?Seated horizontal abd 3x15 GTB ?Seated diagonals 2x15 GTB each ?LAQ 2x10 5# ?Seated march 2x20 5# ?STS 2x10 - elevated table  ? ?PATIENT EDUCATION:  ?Education details: see treatment above  ?Person educated: Patient ?Education method:  Explanation, Demonstration, Tactile cues, Verbal cues, and Handouts ?Education comprehension: verbalized understanding, returned demonstration, verbal cues required, tactile cues required, and needs further education

## 2021-07-03 ENCOUNTER — Encounter (HOSPITAL_BASED_OUTPATIENT_CLINIC_OR_DEPARTMENT_OTHER): Payer: Self-pay | Admitting: Physical Therapy

## 2021-07-03 ENCOUNTER — Ambulatory Visit (HOSPITAL_BASED_OUTPATIENT_CLINIC_OR_DEPARTMENT_OTHER): Payer: Medicare HMO | Admitting: Physical Therapy

## 2021-07-03 DIAGNOSIS — M546 Pain in thoracic spine: Secondary | ICD-10-CM

## 2021-07-03 DIAGNOSIS — M6281 Muscle weakness (generalized): Secondary | ICD-10-CM

## 2021-07-03 DIAGNOSIS — G8929 Other chronic pain: Secondary | ICD-10-CM

## 2021-07-03 DIAGNOSIS — M545 Low back pain, unspecified: Secondary | ICD-10-CM | POA: Diagnosis not present

## 2021-07-03 NOTE — Therapy (Signed)
?OUTPATIENT PHYSICAL THERAPY TREATMENT NOTE ? ? ?Patient Name: [name] ?MRN: [id number] ?DOB:Jul 12, 1965, 56 y.o., female ?Today's Date: 07/03/2021 ? ?PCP: Leeanne Rio, MD ?REFERRING PROVIDER: Leeanne Rio, MD ? ? PT End of Session - 07/03/21 1328   ? ? Visit Number 12   ? Number of Visits 13   ? Date for PT Re-Evaluation 07/23/21   ? Authorization Type [employer] primary; MCD secondary   ? Authorization Time Period 1/23-3/10/23   ? Authorization - Visit Number 3   ? Authorization - Number of Visits 12   ? PT Start Time 1332   ? PT Stop Time 1413   ? PT Time Calculation (min) 41 min   ? Activity Tolerance Patient tolerated treatment well   ? Behavior During Therapy [facility] for tasks assessed/performed   ? ?  ?  ? ?  ? ? ? ? ? ? ? ? ?Past Medical History:  ?Diagnosis Date  ? Allergy   ? Anemia   ? Anxiety   ? Arthritis   ? Blood transfusion without reported diagnosis   ? Cough   ? Cough 06/11/2018  ? Difficulty sleeping 06/11/2018  ? Diverticulitis with perforation 2010  ? DVT (deep vein thrombosis) in pregnancy 11/24/2016  ? DVT, lower extremity ([place])   ? Endometrial cancer ([place])   ? Enlarged lymph nodes 08/13/2017  ? Family history of adverse reaction to anesthesia   ? pts daughter had difficulty awakening following anesthesia, long time to wake up  ? GERD (gastroesophageal reflux disease)   ? Headache   ? Hemoptysis 04/22/2018  ? History of bronchitis   ? History of ear infections   ? Hospital discharge follow-up 03/02/2019  ? Hx of migraines   ? Hyperlipidemia   ? Hypertension   ? IBS (irritable bowel syndrome)   ? Kidney infection   ? Lupus (systemic lupus erythematosus) ([place]) 02/2017  ? Morbid obesity ([place])   ? Neck pain 01/13/2019  ? Ovarian cancer ([place]) dx'd 01/2015  ? PONV (postoperative nausea and vomiting)   ? Port-A-Cath in place   ? Right side  ? Portacath in place 09/12/2015  ? Pre-diabetes   ? Pyelonephritis   ? Right knee pain 11/28/2011  ? Scleroderma ([place])   ? Screen for colon cancer 03/02/2019   ? Slow rate of speech 08/22/2015  ? Chronic from CVA.  She also has loss of left nasolabial fold and slight left facial droop/small asymmetry on the left side secondary to CVA  ? UTI (urinary tract infection) 05/07/2018  ? ?Past Surgical History:  ?Procedure Laterality Date  ? ABDOMINAL HYSTERECTOMY N/A 03/13/2015  ? Procedure: TOTAL HYSTERECTOMY ABDOMINAL BILATERAL SALPINGO OOPHORECTOMY RADICAL TUMOR [place];  Surgeon: Everitt Amber, MD;  [facility];  Service: Gynecology;  Laterality: N/A;  ? BOWEL RESECTION  03/13/2015  ? Procedure: SMALL BOWEL RESECTION;  Surgeon: Everitt Amber, MD;  [facility]: [facility];  Service: Gynecology;;  ? [place] and 1984  ? COLONOSCOPY WITH PROPOFOL N/A 07/19/2020  ? Procedure: COLONOSCOPY WITH PROPOFOL;  Surgeon: Doran Stabler, MD;  [facility];  Service: Gastroenterology;  Laterality: N/A;  ? COLOSTOMY  2008  ? COLOSTOMY TAKEDOWN    ? ESOPHAGOGASTRODUODENOSCOPY (EGD) WITH PROPOFOL N/A 12/21/2018  ? Procedure: ESOPHAGOGASTRODUODENOSCOPY (EGD) WITH PROPOFOL;  Surgeon: Doran Stabler, MD;  [facility];  Service: Gastroenterology;  Laterality: N/A;  ? HEMOSTASIS CLIP PLACEMENT  07/19/2020  ? Procedure: HEMOSTASIS CLIP PLACEMENT;  Surgeon: Wilfrid Lund  L III, MD;  [facility];  Service: Gastroenterology;;  ? LAPAROTOMY N/A 03/13/2015  ? Procedure: EXPLORATORY LAPAROTOMY;  Surgeon: Everitt Amber, MD;  [facility];  Service: Gynecology;  Laterality: N/A;  ? POLYPECTOMY  07/19/2020  ? Procedure: POLYPECTOMY;  Surgeon: Doran Stabler, MD;  Location: [facility];  Service: Gastroenterology;;  ? [place]  ? VENTRAL HERNIA REPAIR  03/13/2015  ? Procedure: HERNIA REPAIR VENTRAL ADULT;  Surgeon: Everitt Amber, MD;  [facility];  Service: Gynecology;;  ? ?Patient Active Problem List  ? Diagnosis Date Noted  ? Viral illness 06/05/2021  ? Chronic diastolic CHF (congestive heart failure) ([place]) 07/19/2020  ? Bronchospasm  07/19/2020  ? Incontinence 03/17/2020  ? Herpes simplex virus (HSV) infection of buttock 01/05/2020  ? OSA (obstructive sleep apnea) 07/25/2019  ? Prediabetes 07/25/2019  ? Lateral epicondylitis of left elbow 01/13/2019  ? Normocytic anemia 05/29/2018  ? Scleroderma ([place]) 04/22/2018  ? Swelling of foot joint, right 04/08/2018  ? Positive ANA (antinuclear antibody) 09/23/2016  ? Eczema 01/27/2016  ? History of pulmonary embolism 09/12/2015  ? Long term current use of anticoagulant therapy 09/06/2015  ? Chemotherapy-induced peripheral neuropathy ([place]) 06/13/2015  ? CKD (chronic kidney disease) stage 3, GFR 30-59 ml/min (HCC) 05/12/2015  ? GERD (gastroesophageal reflux disease) 05/12/2015  ? Ovarian carcinosarcoma, right ([place]) 03/29/2015  ? Endometrial ca [facility]) 03/29/2015  ? Ventral hernia without obstruction or gangrene   ? Morbid obesity ([place]) 02/19/2015  ? Essential hypertension, benign   ? Stress at home 12/14/2014  ? Left knee pain 08/04/2013  ? Seasonal allergies 07/29/2013  ? ? ?REFERRING DIAG:  M54.6 (ICD-10-CM) - Acute right-sided thoracic back pain  ? ?THERAPY DIAG:  ?Chronic bilateral low back pain without sciatica ? ?Pain in thoracic spine ? ?Muscle weakness (generalized) ? ?PERTINENT HISTORY:  ?Currently in remission from endometrial and ovarian cancer  ? ?PRECAUTIONS: None ? ?SUBJECTIVE: "Pain is first thing in morning, takes about an hour to loosen back and get rid of pain by moving then it returns at night" ? ? ?Pain: ?Are you having pain? no ?NPRS scale: 0/10 max at night and in morning 5-6/10 ?Pain location: lower back and bilateral LE ?PAIN TYPE: aching ?Pain description: intermittent  ?Aggravating factors: pulling, lifting, reaching, prolonged standing ?Relieving factors: rest  ? ? ?OBJECTIVE:  ? *Unless otherwise noted all objective measures were captured on initial evaluation.  ? ?DIAGNOSTIC FINDINGS:  ?N/A ?  ?PATIENT SURVEYS:  ?FOTO 36% function to 56% predicted  ?05/30/21: 52% function  ?   ?LUMBARAROM/PROM ?  ?A/PROM A/PROM  ?04/29/2021  ?Flexion Full; increased pain low back  ?Extension Full; decreases pain   ?Right lateral flexion 25% limited   ?Left lateral flexion 50% limited, pulling Rt side low back  ?Right rotation 25% limited   ?Left rotation 50% limited pulling Rt side low back   ? (Blank rows = not tested) ?  ?  ?LE MMT: ?  ?MMT Right ?04/29/2021 Left ?04/29/2021  ?Hip flexion 4; pn low back  4+  ?Hip extension      ?Hip abduction 3+; pn low back  3+  ?Hip adduction      ?Hip internal rotation      ?Hip external rotation      ?Knee flexion      ?Knee extension      ?Ankle dorsiflexion      ?Ankle plantarflexion      ?Ankle inversion      ?  Ankle eversion      ? (Blank rows = not tested) ?  ?FUNCTIONAL TESTS:  ?5 times sit to stand: 14 seconds ?   ?  ?TODAY'S TREATMENT  ? ?[facility] Adult PT Treatment:                                                DATE: 07/03/2021 ?Therapeutic Exercise: ? ?Pt seen for aquatic therapy today.  Treatment took place in water 3.25-4.8 ft in depth at the [employer] pool. Temp of water was 92?.  Pt entered/exited the pool via stairs  step to pattern independently with bilat rail. ? ?Gastroc Stretch and hamstring stretch seated 3x20s hold ?Forward Walking ; backward and side stepping 4 widths ea ?Lumbar stretch in hip hinge position with kick board 5x 20s hold followed by rotation R/L x10 ?Kick board push down x20reps with decreasing BOS, last trial in tandem.  Cues for core stabilization ?Flutter kicking 3x25 ?add/abd 3x22  rep seated with abdominal bracing  ?STS from 3 step (bottom) x10 vc for weight shifting and proper execution. ?Mini squats pool floor then 1st step x10ea ? ?Standing Figure 4 hip stretch 3x 20s hold ?TR and heel raised x20 ?Standing Hip Flexion x10; Extension; x10 the flex/extension x 10. ue supported by yellow hand buoys x10 ? Standing March  ue supported by yellow hand buoys x15 ? ? ?Pt requires the buoyancy of water for active assisted  exercises with buoyancy supported for strengthening and AROM exercises: PT  requires the viscosity of the water for resistance with strengthening exercises ?Hydrostatic pressure also supports joints by [employer]

## 2021-07-04 ENCOUNTER — Ambulatory Visit: Payer: Medicare HMO

## 2021-07-04 DIAGNOSIS — M546 Pain in thoracic spine: Secondary | ICD-10-CM

## 2021-07-04 DIAGNOSIS — M545 Low back pain, unspecified: Secondary | ICD-10-CM

## 2021-07-04 NOTE — Therapy (Signed)
?OUTPATIENT PHYSICAL THERAPY TREATMENT NOTE ? ? ?Patient Name: [name] ?MRN: [id number] ?DOB:06/08/1965, 56 y.o., female ?Today's Date: 07/04/2021 ? ?PCP: Leeanne Rio, MD ?REFERRING PROVIDER: Rise Patience, DO ? ? PT End of Session - 07/04/21 1131   ? ? Visit Number 13   ? Number of Visits 24   ? Date for PT Re-Evaluation 07/23/21   ? Authorization Type [employer] primary; MCD secondary   ? Authorization Time Period Approved 12 PT visits 06/12/2021-07/26/2021   ? Authorization - Visit Number 3   ? Authorization - Number of Visits 12   ? PT Start Time 1128   ? PT Stop Time 1208   ? PT Time Calculation (min) 40 min   ? Activity Tolerance Patient tolerated treatment well   ? Behavior During Therapy [facility] for tasks assessed/performed   ? ?  ?  ? ?  ? ? ? ? ? ? ? ? ? ?Past Medical History:  ?Diagnosis Date  ? Allergy   ? Anemia   ? Anxiety   ? Arthritis   ? Blood transfusion without reported diagnosis   ? Cough   ? Cough 06/11/2018  ? Difficulty sleeping 06/11/2018  ? Diverticulitis with perforation 2010  ? DVT (deep vein thrombosis) in pregnancy 11/24/2016  ? DVT, lower extremity ([place])   ? Endometrial cancer ([place])   ? Enlarged lymph nodes 08/13/2017  ? Family history of adverse reaction to anesthesia   ? pts daughter had difficulty awakening following anesthesia, long time to wake up  ? GERD (gastroesophageal reflux disease)   ? Headache   ? Hemoptysis 04/22/2018  ? History of bronchitis   ? History of ear infections   ? Hospital discharge follow-up 03/02/2019  ? Hx of migraines   ? Hyperlipidemia   ? Hypertension   ? IBS (irritable bowel syndrome)   ? Kidney infection   ? Lupus (systemic lupus erythematosus) ([place]) 02/2017  ? Morbid obesity ([place])   ? Neck pain 01/13/2019  ? Ovarian cancer ([place]) dx'd 01/2015  ? PONV (postoperative nausea and vomiting)   ? Port-A-Cath in place   ? Right side  ? Portacath in place 09/12/2015  ? Pre-diabetes   ? Pyelonephritis   ? Right knee pain 11/28/2011  ? Scleroderma ([place])   ? Screen  for colon cancer 03/02/2019  ? Slow rate of speech 08/22/2015  ? Chronic from CVA.  She also has loss of left nasolabial fold and slight left facial droop/small asymmetry on the left side secondary to CVA  ? UTI (urinary tract infection) 05/07/2018  ? ?Past Surgical History:  ?Procedure Laterality Date  ? ABDOMINAL HYSTERECTOMY N/A 03/13/2015  ? Procedure: TOTAL HYSTERECTOMY ABDOMINAL BILATERAL SALPINGO OOPHORECTOMY RADICAL TUMOR [place];  Surgeon: Everitt Amber, MD;  [facility];  Service: Gynecology;  Laterality: N/A;  ? BOWEL RESECTION  03/13/2015  ? Procedure: SMALL BOWEL RESECTION;  Surgeon: Everitt Amber, MD;  [facility];  Service: Gynecology;;  ? [place] and 1984  ? COLONOSCOPY WITH PROPOFOL N/A 07/19/2020  ? Procedure: COLONOSCOPY WITH PROPOFOL;  Surgeon: Doran Stabler, MD;  [facility];  Service: Gastroenterology;  Laterality: N/A;  ? COLOSTOMY  2008  ? COLOSTOMY TAKEDOWN    ? ESOPHAGOGASTRODUODENOSCOPY (EGD) WITH PROPOFOL N/A 12/21/2018  ? Procedure: ESOPHAGOGASTRODUODENOSCOPY (EGD) WITH PROPOFOL;  Surgeon: Doran Stabler, MD;  [facility];  Service: Gastroenterology;  Laterality: N/A;  ? HEMOSTASIS CLIP PLACEMENT  07/19/2020  ? Procedure: HEMOSTASIS CLIP PLACEMENT;  Surgeon: Doran Stabler, MD;  Location: [facility];  Service: Gastroenterology;;  ? LAPAROTOMY N/A 03/13/2015  ? Procedure: EXPLORATORY LAPAROTOMY;  Surgeon: Everitt Amber, MD;  [facility];  Service: Gynecology;  Laterality: N/A;  ? POLYPECTOMY  07/19/2020  ? Procedure: POLYPECTOMY;  Surgeon: Doran Stabler, MD;  Location: [facility];  Service: Gastroenterology;;  ? [place]  ? VENTRAL HERNIA REPAIR  03/13/2015  ? Procedure: HERNIA REPAIR VENTRAL ADULT;  Surgeon: Everitt Amber, MD;  [facility];  Service: Gynecology;;  ? ?Patient Active Problem List  ? Diagnosis Date Noted  ? Viral illness 06/05/2021  ? Chronic diastolic CHF (congestive heart failure) ([place])  07/19/2020  ? Bronchospasm 07/19/2020  ? Incontinence 03/17/2020  ? Herpes simplex virus (HSV) infection of buttock 01/05/2020  ? OSA (obstructive sleep apnea) 07/25/2019  ? Prediabetes 07/25/2019  ? Lateral epicondylitis of left elbow 01/13/2019  ? Normocytic anemia 05/29/2018  ? Scleroderma ([place]) 04/22/2018  ? Swelling of foot joint, right 04/08/2018  ? Positive ANA (antinuclear antibody) 09/23/2016  ? Eczema 01/27/2016  ? History of pulmonary embolism 09/12/2015  ? Long term current use of anticoagulant therapy 09/06/2015  ? Chemotherapy-induced peripheral neuropathy ([place]) 06/13/2015  ? CKD (chronic kidney disease) stage 3, GFR 30-59 ml/min (HCC) 05/12/2015  ? GERD (gastroesophageal reflux disease) 05/12/2015  ? Ovarian carcinosarcoma, right ([place]) 03/29/2015  ? Endometrial ca [facility]) 03/29/2015  ? Ventral hernia without obstruction or gangrene   ? Morbid obesity ([place]) 02/19/2015  ? Essential hypertension, benign   ? Stress at home 12/14/2014  ? Left knee pain 08/04/2013  ? Seasonal allergies 07/29/2013  ? ? ?REFERRING DIAG:  M54.6 (ICD-10-CM) - Acute right-sided thoracic back pain  ? ?THERAPY DIAG:  ?Chronic bilateral low back pain without sciatica ? ?Pain in thoracic spine ? ?PERTINENT HISTORY:  ?Currently in remission from endometrial and ovarian cancer  ? ?PRECAUTIONS: None ? ?SUBJECTIVE:  ?Pt presents to PT with continued mid and lower back pain. She is wondering if she should go back to the MD and possibly get an xray, as her pain has stayed controlled but she is frustrated at continued discomfort. Pt is ready to begin PT at this time. ? ?Pain: ?Are you having pain? Yes ?NPRS scale: 5/10 ?Pain location: lower back and bilateral LE ?PAIN TYPE: aching ?Pain description: intermittent  ?Aggravating factors: pulling, lifting, reaching, prolonged standing ?Relieving factors: rest  ? ? ?OBJECTIVE:  ? *Unless otherwise noted all objective measures were captured on initial evaluation.  ? ?DIAGNOSTIC FINDINGS:  ?N/A ?   ?PATIENT SURVEYS:  ?FOTO 36% function to 56% predicted  ?05/30/21: 52% function  ?  ?LUMBARAROM/PROM ?  ?A/PROM A/PROM  ?04/29/2021  ?Flexion Full; increased pain low back  ?Extension Full; decreases pain   ?Right lateral flexion 25% limited   ?Left lateral flexion 50% limited, pulling Rt side low back  ?Right rotation 25% limited   ?Left rotation 50% limited pulling Rt side low back   ? (Blank rows = not tested) ?  ?  ?LE MMT: ?  ?MMT Right ?04/29/2021 Left ?04/29/2021  ?Hip flexion 4; pn low back  4+  ?Hip extension      ?Hip abduction 3+; pn low back  3+  ?Hip adduction      ?Hip internal rotation      ?Hip external rotation      ?Knee flexion      ?Knee extension      ?Ankle dorsiflexion      ?  Ankle plantarflexion      ?Ankle inversion      ?Ankle eversion      ? (Blank rows = not tested) ?  ?FUNCTIONAL TESTS:  ?5 times sit to stand: 14 seconds ?   ?  ?TODAY'S TREATMENT  ?[facility] Adult PT Treatment:                                                DATE: 07/04/2021 ?Therapeutic Exercise: ?NuStep lvl 5 UE/LE x 5 min while taking subjective ?Seated physioball rollout x 15 - 5" hold ?Supine PPT with ball squeeze 2x15 - 5" hold ?Supine PPT clamshell 3x20 Black TB ?Supine PPT march 2x20 Black TB ?LTR x 10 ?Supine SLR 3x15 (small range with pulse) ?Abdominal curl up 3x10 ?Seated row 3x15 Black TB ?Seated horizontal abd 3x10 GTB ?Seated bilat ER 3x10 GTB ?LAQ 2x15 5# ?Seated march 2x20 5# ?STS 2x10 - elevated table  ?Standing hip flex/abd 2x10 25#  ? ?[facility] Adult PT Treatment:                                                DATE: 06/27/2021 ?Therapeutic Exercise: ?NuStep lvl 5 UE/LE x 3 min while taking subjective ?Seated physioball rollout x 15 - 5" hold ?Supine PPT with ball squeeze 2x10 - 5" hold ?Supine PPT clamshell 3x15 Black TB ?Supine PPT march 2x20 Black TB ?LTR x 10 ?Supine SLR 2x15 (small range with pulse) ?Abdominal curl up 3x10 ?Seated row 3x15 Black TB ?Seated horizontal abd 2x15 GTB ?LAQ 2x15 5# ?Seated march 2x20  5# ?STS 2x10 - elevated table  ?Standing hip abd 2x10 25#  ? ?[facility] Adult PT Treatment:                                                DATE: 06/20/2021 ?Therapeutic Exercise: ?NuStep lvl 5 UE/LE x 3 min w

## 2021-07-10 ENCOUNTER — Ambulatory Visit (HOSPITAL_BASED_OUTPATIENT_CLINIC_OR_DEPARTMENT_OTHER): Payer: Medicare HMO | Attending: Family Medicine | Admitting: Physical Therapy

## 2021-07-10 ENCOUNTER — Encounter (HOSPITAL_BASED_OUTPATIENT_CLINIC_OR_DEPARTMENT_OTHER): Payer: Self-pay | Admitting: Physical Therapy

## 2021-07-10 DIAGNOSIS — M546 Pain in thoracic spine: Secondary | ICD-10-CM | POA: Diagnosis present

## 2021-07-10 DIAGNOSIS — M545 Low back pain, unspecified: Secondary | ICD-10-CM | POA: Insufficient documentation

## 2021-07-10 DIAGNOSIS — G8929 Other chronic pain: Secondary | ICD-10-CM | POA: Diagnosis present

## 2021-07-10 DIAGNOSIS — M6281 Muscle weakness (generalized): Secondary | ICD-10-CM | POA: Diagnosis present

## 2021-07-10 NOTE — Therapy (Signed)
?OUTPATIENT PHYSICAL THERAPY TREATMENT NOTE ? ? ?Patient Name: [name] ?MRN: [id number] ?DOB:Jun 18, 1965, 56 y.o., female ?Today's Date: 07/10/2021 ? ?PCP: Leeanne Rio, MD ?REFERRING PROVIDER: Leeanne Rio, MD ? ? PT End of Session - 07/10/21 1426   ? ? Visit Number 14   ? Number of Visits 24   ? Date for PT Re-Evaluation 07/23/21   ? Authorization Type [employer] primary; MCD secondary   ? PT Start Time 1416   ? PT Stop Time 1502   ? PT Time Calculation (min) 46 min   ? Activity Tolerance Patient tolerated treatment well   ? Behavior During Therapy [facility] for tasks assessed/performed   ? ?  ?  ? ?  ? ? ? ? ? ? ? ? ? ?Past Medical History:  ?Diagnosis Date  ? Allergy   ? Anemia   ? Anxiety   ? Arthritis   ? Blood transfusion without reported diagnosis   ? Cough   ? Cough 06/11/2018  ? Difficulty sleeping 06/11/2018  ? Diverticulitis with perforation 2010  ? DVT (deep vein thrombosis) in pregnancy 11/24/2016  ? DVT, lower extremity ([place])   ? Endometrial cancer ([place])   ? Enlarged lymph nodes 08/13/2017  ? Family history of adverse reaction to anesthesia   ? pts daughter had difficulty awakening following anesthesia, long time to wake up  ? GERD (gastroesophageal reflux disease)   ? Headache   ? Hemoptysis 04/22/2018  ? History of bronchitis   ? History of ear infections   ? Hospital discharge follow-up 03/02/2019  ? Hx of migraines   ? Hyperlipidemia   ? Hypertension   ? IBS (irritable bowel syndrome)   ? Kidney infection   ? Lupus (systemic lupus erythematosus) ([place]) 02/2017  ? Morbid obesity ([place])   ? Neck pain 01/13/2019  ? Ovarian cancer ([place]) dx'd 01/2015  ? PONV (postoperative nausea and vomiting)   ? Port-A-Cath in place   ? Right side  ? Portacath in place 09/12/2015  ? Pre-diabetes   ? Pyelonephritis   ? Right knee pain 11/28/2011  ? Scleroderma ([place])   ? Screen for colon cancer 03/02/2019  ? Slow rate of speech 08/22/2015  ? Chronic from CVA.  She also has loss of left nasolabial fold and slight left  facial droop/small asymmetry on the left side secondary to CVA  ? UTI (urinary tract infection) 05/07/2018  ? ?Past Surgical History:  ?Procedure Laterality Date  ? ABDOMINAL HYSTERECTOMY N/A 03/13/2015  ? Procedure: TOTAL HYSTERECTOMY ABDOMINAL BILATERAL SALPINGO OOPHORECTOMY RADICAL TUMOR [place];  Surgeon: Everitt Amber, MD;  [facility];  Service: Gynecology;  Laterality: N/A;  ? BOWEL RESECTION  03/13/2015  ? Procedure: SMALL BOWEL RESECTION;  Surgeon: Everitt Amber, MD;  [facility];  Service: Gynecology;;  ? [place] and 1984  ? COLONOSCOPY WITH PROPOFOL N/A 07/19/2020  ? Procedure: COLONOSCOPY WITH PROPOFOL;  Surgeon: Doran Stabler, MD;  [facility];  Service: Gastroenterology;  Laterality: N/A;  ? COLOSTOMY  2008  ? COLOSTOMY TAKEDOWN    ? ESOPHAGOGASTRODUODENOSCOPY (EGD) WITH PROPOFOL N/A 12/21/2018  ? Procedure: ESOPHAGOGASTRODUODENOSCOPY (EGD) WITH PROPOFOL;  Surgeon: Doran Stabler, MD;  [facility];  Service: Gastroenterology;  Laterality: N/A;  ? HEMOSTASIS CLIP PLACEMENT  07/19/2020  ? Procedure: HEMOSTASIS CLIP PLACEMENT;  Surgeon: Doran Stabler, MD;  Location: [facility];  Service: Gastroenterology;;  ? LAPAROTOMY N/A 03/13/2015  ? Procedure: EXPLORATORY LAPAROTOMY;  Surgeon: Terrence Dupont  Denman George, MD;  [facility];  Service: Gynecology;  Laterality: N/A;  ? POLYPECTOMY  07/19/2020  ? Procedure: POLYPECTOMY;  Surgeon: Doran Stabler, MD;  Location: [facility];  Service: Gastroenterology;;  ? [place]  ? VENTRAL HERNIA REPAIR  03/13/2015  ? Procedure: HERNIA REPAIR VENTRAL ADULT;  Surgeon: Everitt Amber, MD;  [facility];  Service: Gynecology;;  ? ?Patient Active Problem List  ? Diagnosis Date Noted  ? Viral illness 06/05/2021  ? Chronic diastolic CHF (congestive heart failure) ([place]) 07/19/2020  ? Bronchospasm 07/19/2020  ? Incontinence 03/17/2020  ? Herpes simplex virus (HSV) infection of buttock 01/05/2020  ? OSA  (obstructive sleep apnea) 07/25/2019  ? Prediabetes 07/25/2019  ? Lateral epicondylitis of left elbow 01/13/2019  ? Normocytic anemia 05/29/2018  ? Scleroderma ([place]) 04/22/2018  ? Swelling of foot joint, right 04/08/2018  ? Positive ANA (antinuclear antibody) 09/23/2016  ? Eczema 01/27/2016  ? History of pulmonary embolism 09/12/2015  ? Long term current use of anticoagulant therapy 09/06/2015  ? Chemotherapy-induced peripheral neuropathy ([place]) 06/13/2015  ? CKD (chronic kidney disease) stage 3, GFR 30-59 ml/min (HCC) 05/12/2015  ? GERD (gastroesophageal reflux disease) 05/12/2015  ? Ovarian carcinosarcoma, right ([place]) 03/29/2015  ? Endometrial ca [facility]) 03/29/2015  ? Ventral hernia without obstruction or gangrene   ? Morbid obesity ([place]) 02/19/2015  ? Essential hypertension, benign   ? Stress at home 12/14/2014  ? Left knee pain 08/04/2013  ? Seasonal allergies 07/29/2013  ? ? ?REFERRING DIAG:  M54.6 (ICD-10-CM) - Acute right-sided thoracic back pain  ? ?THERAPY DIAG:  ?Chronic bilateral low back pain without sciatica ? ?Pain in thoracic spine ? ?Muscle weakness (generalized) ? ?PERTINENT HISTORY:  ?Currently in remission from endometrial and ovarian cancer  ? ?PRECAUTIONS: None ? ?SUBJECTIVE: "Pain about a 5/10 in am then before bed. Overall it has come down. I'm not as bent over as I was. I think the water therapy does more for me then the land" ? ? ?Pain: ?Are you having pain? no ?NPRS scale: 0/10 max at night and in morning 5-6/10 ?Pain location: lower back and bilateral LE ?PAIN TYPE: aching ?Pain description: intermittent  ?Aggravating factors: pulling, lifting, reaching, prolonged standing ?Relieving factors: rest  ? ? ?OBJECTIVE:  ? *Unless otherwise noted all objective measures were captured on initial evaluation.  ? ?DIAGNOSTIC FINDINGS:  ?N/A ?  ?PATIENT SURVEYS:  ?FOTO 36% function to 56% predicted  ?05/30/21: 52% function  ?  ?LUMBARAROM/PROM ?  ?A/PROM A/PROM  ?04/29/2021  ?Flexion Full; increased pain  low back  ?Extension Full; decreases pain   ?Right lateral flexion 25% limited   ?Left lateral flexion 50% limited, pulling Rt side low back  ?Right rotation 25% limited   ?Left rotation 50% limited pulling Rt side low back   ? (Blank rows = not tested) ?  ?  ?LE MMT: ?  ?MMT Right ?04/29/2021 Left ?04/29/2021  ?Hip flexion 4; pn low back  4+  ?Hip extension      ?Hip abduction 3+; pn low back  3+  ?Hip adduction      ?Hip internal rotation      ?Hip external rotation      ?Knee flexion      ?Knee extension      ?Ankle dorsiflexion      ?Ankle plantarflexion      ?Ankle inversion      ?Ankle eversion      ? (Blank rows = not tested) ?  ?  FUNCTIONAL TESTS:  ?5 times sit to stand: 14 seconds ?   ?  ?TODAY'S TREATMENT  ? ?[facility] Adult PT Treatment:                                                DATE: 07/03/2021 ?Therapeutic Exercise: ? ?Pt seen for aquatic therapy today.  Treatment took place in water 3.25-4.8 ft in depth at the [employer] pool. Temp of water was 92?.  Pt entered/exited the pool via stairs  step to pattern independently with bilat rail. ? ?Gastroc Stretch and hamstring stretch seated 3x20s hold ?Forward Walking ; backward and side stepping 4 widths ea ?Lumbar stretch in hip hinge position with kick board 5x 20s hold followed by rotation R/L x10 ?Yellow hand buoy push down 5x12reps with small BOS then tandem R/L.  Core engaged ?LB strecth into extension standing at wall 3x30s ?Plank on 3rd step (bottom) hip flex stretch 3x 20s R/L ?Plank on step hip extension x10 cues for abdominal bracing and glut contraction ?Flutter kicking 3x20 seated ?add/abd 3x20  rep seated with abdominal bracing  ?STS from 3 step (bottom) x10 vc for weight shifting and proper execution. ?Step ups forward bottom step x10 Leading R/L ?Side lunges 4 widths with yellow hand buoys add/abd of shoulders. ? ? ? ?Pt requires the buoyancy of water for active assisted exercises with buoyancy supported for strengthening and AROM  exercises: PT  requires the viscosity of the water for resistance with strengthening exercises ?Hydrostatic pressure also supports joints by unweighting joint load by at least 50 % in 3-4 feet depth water. 80% in ches

## 2021-07-11 ENCOUNTER — Ambulatory Visit: Payer: Medicare HMO | Attending: Family Medicine

## 2021-07-11 DIAGNOSIS — M545 Low back pain, unspecified: Secondary | ICD-10-CM | POA: Diagnosis present

## 2021-07-11 DIAGNOSIS — G8929 Other chronic pain: Secondary | ICD-10-CM | POA: Diagnosis present

## 2021-07-11 DIAGNOSIS — M546 Pain in thoracic spine: Secondary | ICD-10-CM | POA: Diagnosis present

## 2021-07-11 DIAGNOSIS — M6281 Muscle weakness (generalized): Secondary | ICD-10-CM | POA: Diagnosis present

## 2021-07-11 NOTE — Therapy (Deleted)
?OUTPATIENT PHYSICAL THERAPY TREATMENT NOTE ? ? ?Patient Name: [name] ?MRN: [id number] ?DOB:[birth year], 56 y.o., female ?Today's Date: 07/11/2021 ? ?PCP: Leeanne Rio, MD ?REFERRING PROVIDER: Rise Patience, DO ? ? ? ? ? ? ? ? ? ? ? ?Past Medical History:  ?Diagnosis Date  ? Allergy   ? Anemia   ? Anxiety   ? Arthritis   ? Blood transfusion without reported diagnosis   ? Cough   ? Cough 06/11/2018  ? Difficulty sleeping 06/11/2018  ? Diverticulitis with perforation 2010  ? DVT (deep vein thrombosis) in pregnancy 11/24/2016  ? DVT, lower extremity ([place])   ? Endometrial cancer ([place])   ? Enlarged lymph nodes 08/13/2017  ? Family history of adverse reaction to anesthesia   ? pts daughter had difficulty awakening following anesthesia, long time to wake up  ? GERD (gastroesophageal reflux disease)   ? Headache   ? Hemoptysis 04/22/2018  ? History of bronchitis   ? History of ear infections   ? Hospital discharge follow-up 03/02/2019  ? Hx of migraines   ? Hyperlipidemia   ? Hypertension   ? IBS (irritable bowel syndrome)   ? Kidney infection   ? Lupus (systemic lupus erythematosus) ([place]) 02/2017  ? Morbid obesity ([place])   ? Neck pain 01/13/2019  ? Ovarian cancer ([place]) dx'd 01/2015  ? PONV (postoperative nausea and vomiting)   ? Port-A-Cath in place   ? Right side  ? Portacath in place 09/12/2015  ? Pre-diabetes   ? Pyelonephritis   ? Right knee pain 11/28/2011  ? Scleroderma ([place])   ? Screen for colon cancer 03/02/2019  ? Slow rate of speech 08/22/2015  ? Chronic from CVA.  She also has loss of left nasolabial fold and slight left facial droop/small asymmetry on the left side secondary to CVA  ? UTI (urinary tract infection) 05/07/2018  ? ?Past Surgical History:  ?Procedure Laterality Date  ? ABDOMINAL HYSTERECTOMY N/A 03/13/2015  ? Procedure: TOTAL HYSTERECTOMY ABDOMINAL BILATERAL SALPINGO OOPHORECTOMY RADICAL TUMOR [place];  Surgeon: Everitt Amber, MD;  [facility];  Service: Gynecology;  Laterality: N/A;  ? BOWEL  RESECTION  03/13/2015  ? Procedure: SMALL BOWEL RESECTION;  Surgeon: Everitt Amber, MD;  [facility];  Service: Gynecology;;  ? [place] and 1984  ? COLONOSCOPY WITH PROPOFOL N/A 07/19/2020  ? Procedure: COLONOSCOPY WITH PROPOFOL;  Surgeon: Doran Stabler, MD;  [facility];  Service: Gastroenterology;  Laterality: N/A;  ? COLOSTOMY  2008  ? COLOSTOMY TAKEDOWN    ? ESOPHAGOGASTRODUODENOSCOPY (EGD) WITH PROPOFOL N/A 12/21/2018  ? Procedure: ESOPHAGOGASTRODUODENOSCOPY (EGD) WITH PROPOFOL;  Surgeon: Doran Stabler, MD;  [facility];  Service: Gastroenterology;  Laterality: N/A;  ? HEMOSTASIS CLIP PLACEMENT  07/19/2020  ? Procedure: HEMOSTASIS CLIP PLACEMENT;  Surgeon: Doran Stabler, MD;  Location: [facility];  Service: Gastroenterology;;  ? LAPAROTOMY N/A 03/13/2015  ? Procedure: EXPLORATORY LAPAROTOMY;  Surgeon: Everitt Amber, MD;  [facility];  Service: Gynecology;  Laterality: N/A;  ? POLYPECTOMY  07/19/2020  ? Procedure: POLYPECTOMY;  Surgeon: Doran Stabler, MD;  Location: [facility];  Service: Gastroenterology;;  ? [place]  ? VENTRAL HERNIA REPAIR  03/13/2015  ? Procedure: HERNIA REPAIR VENTRAL ADULT;  Surgeon: Everitt Amber, MD;  [facility];  Service: Gynecology;;  ? ?Patient Active Problem List  ? Diagnosis Date Noted  ? Viral illness 06/05/2021  ? Chronic diastolic CHF (congestive heart  failure) ([place]) 07/19/2020  ? Bronchospasm 07/19/2020  ? Incontinence 03/17/2020  ? Herpes simplex virus (HSV) infection of buttock 01/05/2020  ? OSA (obstructive sleep apnea) 07/25/2019  ? Prediabetes 07/25/2019  ? Lateral epicondylitis of left elbow 01/13/2019  ? Normocytic anemia 05/29/2018  ? Scleroderma ([place]) 04/22/2018  ? Swelling of foot joint, right 04/08/2018  ? Positive ANA (antinuclear antibody) 09/23/2016  ? Eczema 01/27/2016  ? History of pulmonary embolism 09/12/2015  ? Long term current use of anticoagulant therapy 09/06/2015  ?  Chemotherapy-induced peripheral neuropathy ([place]) 06/13/2015  ? CKD (chronic kidney disease) stage 3, GFR 30-59 ml/min (HCC) 05/12/2015  ? GERD (gastroesophageal reflux disease) 05/12/2015  ? 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 Endometrial ca [facility]) 03/29/2015  ? Ventral hernia without obstruction or gangrene   ? Morbid obesity ([place]) 02/19/2015  ? Essential hypertension, benign   ? Stress at home 12/14/2014  ? Left knee pain 08/04/2013  ? Seasonal allergies 07/29/2013  ? ? ?REFERRING DIAG:  M54.6 (ICD-10-CM) - Acute right-sided thoracic back pain  ? ?THERAPY DIAG:  ?No diagnosis found. ? ?PERTINENT HISTORY:  ?Currently in remission from endometrial and ovarian cancer  ? ?PRECAUTIONS: None ? ?SUBJECTIVE: "Pain about a 5/10 in am then before bed. Overall it has come down. I'm not as bent over as I was. I think the water therapy does more for me then the land" ? ? ?Pain: ?Are you having pain? no ?NPRS scale: 0/10 max at night and in morning 5-6/10 ?Pain location: lower back and bilateral LE ?PAIN TYPE: aching ?Pain description: intermittent  ?Aggravating factors: pulling, lifting, reaching, prolonged standing ?Relieving factors: rest  ? ? ?OBJECTIVE:  ? *Unless otherwise noted all objective measures were captured on initial evaluation.  ? ?DIAGNOSTIC FINDINGS:  ?N/A ?  ?PATIENT SURVEYS:  ?FOTO 36% function to 56% predicted  ?05/30/21: 52% function  ?  ?LUMBARAROM/PROM ?  ?A/PROM A/PROM  ?04/29/2021  ?Flexion Full; increased pain low back  ?Extension Full; decreases pain   ?Right lateral flexion 25% limited   ?Left lateral flexion 50% limited, pulling Rt side low back  ?Right rotation 25% limited   ?Left rotation 50% limited pulling Rt side low back   ? (Blank rows = not tested) ?  ?  ?LE MMT: ?  ?MMT Right ?04/29/2021 Left ?04/29/2021  ?Hip flexion 4; pn low back  4+  ?Hip extension      ?Hip abduction 3+; pn low back  3+  ?Hip adduction      ?Hip internal rotation      ?Hip external rotation      ?Knee flexion       ?Knee extension      ?Ankle dorsiflexion      ?Ankle plantarflexion      ?Ankle inversion      ?Ankle eversion      ? (Blank rows = not tested) ?  ?FUNCTIONAL TESTS:  ?5 times sit to stand: 14 seconds ?   ?  ?TODAY'S TREATMENT  ? ?[facility] Adult PT Treatment:                                                DATE: 07/03/2021 ?Therapeutic Exercise: ? ?Pt seen for aquatic therapy today.  Treatment took place in water 3.25-4.8 ft in depth at the [employer] [facility] pool. Temp of water was 92?[place] Kitchen  Pt  entered/exited the pool via stairs  step to pattern independently with bilat rail. ? ?Gastroc Stretch and hamstring stretch seated 3x20s hold ?Forward Walking ; backward and side stepping 4 widths ea ?Lumbar stretch in hip hinge position with kick board 5x 20s hold followed by rotation R/L x10 ?Yellow hand buoy push down 5x12reps with small BOS then tandem R/L.  Core engaged ?LB strecth into extension standing at wall 3x30s ?Plank on 3rd step (bottom) hip flex stretch 3x 20s R/L ?Plank on step hip extension x10 cues for abdominal bracing and glut contraction ?Flutter kicking 3x20 seated ?add/abd 3x20  rep seated with abdominal bracing  ?STS from 3 step (bottom) x10 vc for weight shifting and proper execution. ?Step ups forward bottom step x10 Leading R/L ?Side lunges 4 widths with yellow hand buoys add/abd of shoulders. ? ? ? ?Pt requires the buoyancy of water for active assisted exercises with buoyancy supported for strengthening and AROM exercises: PT  requires the viscosity of the water for resistance with strengthening exercises ?Hydrostatic pressure also supports joints by unweighting joint load by at least 50 % in 3-4 feet depth water. 80% in chest to neck deep water. ?Water will allow for  reduced joint loading through buoyancy to help patient  ? ? ?PATIENT EDUCATION:  ?Education details: see treatment above  ?Person educated: Patient ?Education method: Explanation, Demonstration, Tactile cues, Verbal cues, and  Handouts ?Education comprehension: verbalized understanding, returned demonstration, verbal cues required, tactile cues required, and needs further education ?  ?  ?HOME EXERCISE PROGRAM: ?Access Code: [email or web address]

## 2021-07-11 NOTE — Therapy (Signed)
?OUTPATIENT PHYSICAL THERAPY TREATMENT NOTE ? ? ?Patient Name: [name] ?MRN: [id number] ?DOB:[birth year], 56 y.o., female ?Today's Date: 07/11/2021 ? ?PCP: Leeanne Rio, MD ?REFERRING PROVIDER: Rise Patience, DO ? ? PT End of Session - 07/11/21 1134   ? ? Visit Number [id number]   ? Number of Visits 24   ? Date for PT Re-Evaluation 07/23/21   ? Authorization Type [employer] primary; MCD secondary   ? PT Start Time 1130   ? PT Stop Time 1209   ? PT Time Calculation (min) 39 min   ? Activity Tolerance Patient tolerated treatment well   ? Behavior During Therapy [facility] for tasks assessed/performed   ? ?  ?  ? ?  ? ? ? ? ? ? ? ? ? ? ?Past Medical History:  ?Diagnosis Date  ? Allergy   ? Anemia   ? Anxiety   ? Arthritis   ? Blood transfusion without reported diagnosis   ? Cough   ? Cough 06/11/2018  ? Difficulty sleeping 06/11/2018  ? Diverticulitis with perforation 2010  ? DVT (deep vein thrombosis) in pregnancy 11/24/2016  ? DVT, lower extremity ([place])   ? Endometrial cancer ([place])   ? Enlarged lymph nodes 08/13/2017  ? Family history of adverse reaction to anesthesia   ? pts daughter had difficulty awakening following anesthesia, long time to wake up  ? GERD (gastroesophageal reflux disease)   ? Headache   ? Hemoptysis 04/22/2018  ? History of bronchitis   ? History of ear infections   ? Hospital discharge follow-up 03/02/2019  ? Hx of migraines   ? Hyperlipidemia   ? Hypertension   ? IBS (irritable bowel syndrome)   ? Kidney infection   ? Lupus (systemic lupus erythematosus) ([place]) 02/2017  ? Morbid obesity ([place])   ? Neck pain 01/13/2019  ? Ovarian cancer ([place]) dx'd 01/2015  ? PONV (postoperative nausea and vomiting)   ? Port-A-Cath in place   ? Right side  ? Portacath in place 09/12/2015  ? Pre-diabetes   ? Pyelonephritis   ? Right knee pain 11/28/2011  ? Scleroderma ([place])   ? Screen for colon cancer 03/02/2019  ? Slow rate of speech 08/22/2015  ? Chronic from CVA.  She also has loss of left nasolabial fold and slight left facial  droop/small asymmetry on the left side secondary to CVA  ? UTI (urinary tract infection) 05/07/2018  ? ?Past Surgical History:  ?Procedure Laterality Date  ? ABDOMINAL HYSTERECTOMY N/A 03/13/2015  ? Procedure: TOTAL HYSTERECTOMY ABDOMINAL BILATERAL SALPINGO OOPHORECTOMY RADICAL TUMOR [place];  Surgeon: Everitt Amber, MD;  [facility];  Service: Gynecology;  Laterality: N/A;  ? BOWEL RESECTION  03/13/2015  ? Procedure: SMALL BOWEL RESECTION;  Surgeon: Everitt Amber, MD;  [facility];  Service: Gynecology;;  ? [place] and 1984  ? COLONOSCOPY WITH PROPOFOL N/A 07/19/2020  ? Procedure: COLONOSCOPY WITH PROPOFOL;  Surgeon: Doran Stabler, MD;  [facility];  Service: Gastroenterology;  Laterality: N/A;  ? COLOSTOMY  2008  ? COLOSTOMY TAKEDOWN    ? ESOPHAGOGASTRODUODENOSCOPY (EGD) WITH PROPOFOL N/A 12/21/2018  ? Procedure: ESOPHAGOGASTRODUODENOSCOPY (EGD) WITH PROPOFOL;  Surgeon: Doran Stabler, MD;  [facility];  Service: Gastroenterology;  Laterality: N/A;  ? HEMOSTASIS CLIP PLACEMENT  07/19/2020  ? Procedure: HEMOSTASIS CLIP PLACEMENT;  Surgeon: Doran Stabler, MD;  Location[facility];  Service: Gastroenterology;;  ? LAPAROTOMY N/A 03/13/2015  ? Procedure: EXPLORATORY LAPAROTOMY;  Surgeon: Terrence Dupont  Denman George, MD;  [facility];  Service: Gynecology;  Laterality: N/A;  ? POLYPECTOMY  07/19/2020  ? Procedure: POLYPECTOMY;  Surgeon: Doran Stabler, MD;  Location[facility];  Service: Gastroenterology;;  ? [place]  ? VENTRAL HERNIA REPAIR  03/13/2015  ? Procedure: HERNIA REPAIR VENTRAL ADULT;  Surgeon: Everitt Amber, MD;  [facility];  Service: Gynecology;;  ? ?Patient Active Problem List  ? Diagnosis Date Noted  ? Viral illness 06/05/2021  ? Chronic diastolic CHF (congestive heart failure) ([place]) 07/19/2020  ? Bronchospasm 07/19/2020  ? Incontinence 03/17/2020  ? Herpes simplex virus (HSV) infection of buttock 01/05/2020  ? OSA (obstructive  sleep apnea) 07/25/2019  ? Prediabetes 07/25/2019  ? Lateral epicondylitis of left elbow 01/13/2019  ? Normocytic anemia 05/29/2018  ? Scleroderma ([place]) 04/22/2018  ? Swelling of foot joint, right 04/08/2018  ? Positive ANA (antinuclear antibody) 09/23/2016  ? Eczema 01/27/2016  ? History of pulmonary embolism 09/12/2015  ? Long term current use of anticoagulant therapy 09/06/2015  ? Chemotherapy-induced peripheral neuropathy ([place]) 06/13/2015  ? CKD (chronic kidney disease) stage 3, GFR 30-59 ml/min (HCC) 05/12/2015  ? GERD (gastroesophageal reflux disease) 05/12/2015  ? 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 Endometrial ca [facility]) 03/29/2015  ? Ventral hernia without obstruction or gangrene   ? Morbid obesity ([place]) 02/19/2015  ? Essential hypertension, benign   ? Stress at home 12/14/2014  ? Left knee pain 08/04/2013  ? Seasonal allergies 07/29/2013  ? ? ?REFERRING DIAG:  M54.6 (ICD-10-CM) - Acute right-sided thoracic back pain  ? ?THERAPY DIAG:  ?Chronic bilateral low back pain without sciatica ? ?Pain in thoracic spine ? ?Muscle weakness (generalized) ? ?PERTINENT HISTORY:  ?Currently in remission from endometrial and ovarian cancer  ? ?PRECAUTIONS: None ? ?SUBJECTIVE:  ?Pt presents to PT with reports of lower back pain and discomfort. She had aquatic therapy yesterday, with pt noting some muscle soreness. Pt is ready to begin PT at this time.  ? ?Pain: ?Are you having pain? Yes ?NPRS scale: 5/10 ?Pain location: lower back and bilateral LE ?PAIN TYPE: aching ?Pain description: intermittent  ?Aggravating factors: pulling, lifting, reaching, prolonged standing ?Relieving factors: rest  ? ? ?OBJECTIVE:  ? *Unless otherwise noted all objective measures were captured on initial evaluation.  ? ?DIAGNOSTIC FINDINGS:  ?N/A ?  ?PATIENT SURVEYS:  ?FOTO 36% function to 56% predicted  ?05/30/21: 52% function  ?  ?LUMBARAROM/PROM ?  ?A/PROM A/PROM  ?04/29/2021  ?Flexion Full; increased pain low back  ?Extension Full;  decreases pain   ?Right lateral flexion 25% limited   ?Left lateral flexion 50% limited, pulling Rt side low back  ?Right rotation 25% limited   ?Left rotation 50% limited pulling Rt side low back   ? (Blank rows = not tested) ?  ?  ?LE MMT: ?  ?MMT Right ?04/29/2021 Left ?04/29/2021  ?Hip flexion 4; pn low back  4+  ?Hip extension      ?Hip abduction 3+; pn low back  3+  ?Hip adduction      ?Hip internal rotation      ?Hip external rotation      ?Knee flexion      ?Knee extension      ?Ankle dorsiflexion      ?Ankle plantarflexion      ?Ankle inversion      ?Ankle eversion      ? (Blank rows = not tested) ?  ?FUNCTIONAL TESTS:  ?5 times sit to  stand: 14 seconds ?   ?  ?TODAY'S TREATMENT  ?[facility] Adult PT Treatment:                                                DATE: 07/11/2021 ?Therapeutic Exercise: ?NuStep lvl 5 UE/LE x 5 min while taking subjective ?Seated physioball rollout 2x15 - 5" hold ?Supine PPT clamshell 3x20 Black TB ?Supine PPT with march 2x20 ?Supine 90/90 hold 4x5" hold ?LTR x 10 ?Supine SLR 2x10 (small range with pulse) ?Abdominal curl up 3x10 ?Seated row 3x15 Black TB ?Seated horizontal abd 3x10 BTB ?LAQ 3x10 6# ?Seated march 2x20 6# ?STS 2x10 - elevated table  ?Standing hip abd 2x10 25#  ? ?[facility] Adult PT Treatment:                                                DATE: 07/04/2021 ?Therapeutic Exercise: ?NuStep lvl 5 UE/LE x 5 min while taking subjective ?Seated physioball rollout x 15 - 5" hold ?Supine PPT with ball squeeze 2x15 - 5" hold ?Supine PPT clamshell 3x20 Black TB ?Supine PPT march 2x20 Black TB ?LTR x 10 ?Supine SLR 3x15 (small range with pulse) ?Abdominal curl up 3x10 ?Seated row 3x15 Black TB ?Seated horizontal abd 3x10 GTB ?Seated bilat ER 3x10 GTB ?LAQ 2x15 5# ?Seated march 2x20 5# ?STS 2x10 - elevated table  ?Standing hip flex/abd 2x10 25#  ? ?[facility] Adult PT Treatment:                                                DATE: 06/27/2021 ?Therapeutic Exercise: ?NuStep lvl 5 UE/LE x 3 min while  taking subjective ?Seated physioball rollout x 15 - 5" hold ?Supine PPT with ball squeeze 2x10 - 5" hold ?Supine PPT clamshell 3x15 Black TB ?Supine PPT march 2x20 Black TB ?LTR x 10 ?Supine SLR 2x15 (small

## 2021-07-15 ENCOUNTER — Other Ambulatory Visit: Payer: Self-pay

## 2021-07-15 DIAGNOSIS — M545 Low back pain, unspecified: Secondary | ICD-10-CM

## 2021-07-15 MED ORDER — BACLOFEN 10 MG PO TABS: 10.0000 mg | ORAL_TABLET | Freq: Two times a day (BID) | ORAL | 0 refills | Status: DC | PRN

## 2021-07-24 ENCOUNTER — Ambulatory Visit: Payer: Medicare HMO

## 2021-07-24 DIAGNOSIS — M545 Low back pain, unspecified: Secondary | ICD-10-CM | POA: Diagnosis not present

## 2021-07-24 DIAGNOSIS — M546 Pain in thoracic spine: Secondary | ICD-10-CM

## 2021-07-24 DIAGNOSIS — M6281 Muscle weakness (generalized): Secondary | ICD-10-CM

## 2021-07-24 NOTE — Therapy (Signed)
?OUTPATIENT PHYSICAL THERAPY TREATMENT NOTE/PROGRESS NOTE ? ?Progress Note ?Reporting Period 06/11/2021 to 07/24/2021 ? ?See note below for Objective Data and Assessment of Progress/Goals.  ? ?Patient Name: [name] ?MRN: [id number] ?DOB:Aug 10, 1965, 56 y.o., female ?Today's Date: 07/24/2021 ? ?PCP: Leeanne Rio, MD ?REFERRING PROVIDER: Rise Patience, DO ? ? PT End of Session - 07/24/21 1130   ? ? Visit Number 16   ? Number of Visits 30   ? Date for PT Re-Evaluation 09/18/21   ? Authorization Type [employer] primary; MCD secondary   ? PT Start Time 1130   ? PT Stop Time 1210   ? PT Time Calculation (min) 40 min   ? Activity Tolerance Patient tolerated treatment well   ? Behavior During Therapy [facility] for tasks assessed/performed   ? ?  ?  ? ?  ? ? ? ? ? ? ? ? ? ? ? ?Past Medical History:  ?Diagnosis Date  ? Allergy   ? Anemia   ? Anxiety   ? Arthritis   ? Blood transfusion without reported diagnosis   ? Cough   ? Cough 06/11/2018  ? Difficulty sleeping 06/11/2018  ? Diverticulitis with perforation 2010  ? DVT (deep vein thrombosis) in pregnancy 11/24/2016  ? DVT, lower extremity ([place])   ? Endometrial cancer ([place])   ? Enlarged lymph nodes 08/13/2017  ? Family history of adverse reaction to anesthesia   ? pts daughter had difficulty awakening following anesthesia, long time to wake up  ? GERD (gastroesophageal reflux disease)   ? Headache   ? Hemoptysis 04/22/2018  ? History of bronchitis   ? History of ear infections   ? Hospital discharge follow-up 03/02/2019  ? Hx of migraines   ? Hyperlipidemia   ? Hypertension   ? IBS (irritable bowel syndrome)   ? Kidney infection   ? Lupus (systemic lupus erythematosus) ([place]) 02/2017  ? Morbid obesity ([place])   ? Neck pain 01/13/2019  ? Ovarian cancer ([place]) dx'd 01/2015  ? PONV (postoperative nausea and vomiting)   ? Port-A-Cath in place   ? Right side  ? Portacath in place 09/12/2015  ? Pre-diabetes   ? Pyelonephritis   ? Right knee pain 11/28/2011  ? Scleroderma ([place])   ? Screen for  colon cancer 03/02/2019  ? Slow rate of speech 08/22/2015  ? Chronic from CVA.  She also has loss of left nasolabial fold and slight left facial droop/small asymmetry on the left side secondary to CVA  ? UTI (urinary tract infection) 05/07/2018  ? ?Past Surgical History:  ?Procedure Laterality Date  ? ABDOMINAL HYSTERECTOMY N/A 03/13/2015  ? Procedure: TOTAL HYSTERECTOMY ABDOMINAL BILATERAL SALPINGO OOPHORECTOMY RADICAL TUMOR [place];  Surgeon: Everitt Amber, MD;  [facility];  Service: Gynecology;  Laterality: N/A;  ? BOWEL RESECTION  03/13/2015  ? Procedure: SMALL BOWEL RESECTION;  Surgeon: Everitt Amber, MD;  [facility];  Service: Gynecology;;  ? [place] and 1984  ? COLONOSCOPY WITH PROPOFOL N/A 07/19/2020  ? Procedure: COLONOSCOPY WITH PROPOFOL;  Surgeon: Doran Stabler, MD;  [facility];  Service: Gastroenterology;  Laterality: N/A;  ? COLOSTOMY  2008  ? COLOSTOMY TAKEDOWN    ? ESOPHAGOGASTRODUODENOSCOPY (EGD) WITH PROPOFOL N/A 12/21/2018  ? Procedure: ESOPHAGOGASTRODUODENOSCOPY (EGD) WITH PROPOFOL;  Surgeon: Doran Stabler, MD;  [facility];  Service: Gastroenterology;  Laterality: N/A;  ? HEMOSTASIS CLIP PLACEMENT  07/19/2020  ? Procedure: HEMOSTASIS CLIP PLACEMENT;  Surgeon: [name],  MD;  [facility];  Service: Gastroenterology;;  ? LAPAROTOMY N/A 03/13/2015  ? Procedure: EXPLORATORY LAPAROTOMY;  Surgeon: Everitt Amber, MD;  [facility];  Service: Gynecology;  Laterality: N/A;  ? POLYPECTOMY  07/19/2020  ? Procedure: POLYPECTOMY;  Surgeon: Doran Stabler, MD;  Location[facility];  Service: Gastroenterology;;  ? [place]  ? VENTRAL HERNIA REPAIR  03/13/2015  ? Procedure: HERNIA REPAIR VENTRAL ADULT;  Surgeon: Everitt Amber, MD;  [facility];  Service: Gynecology;;  ? ?Patient Active Problem List  ? Diagnosis Date Noted  ? Viral illness 06/05/2021  ? Chronic diastolic CHF (congestive heart failure) ([place])  07/19/2020  ? Bronchospasm 07/19/2020  ? Incontinence 03/17/2020  ? Herpes simplex virus (HSV) infection of buttock 01/05/2020  ? OSA (obstructive sleep apnea) 07/25/2019  ? Prediabetes 07/25/2019  ? Lateral epicondylitis of left elbow 01/13/2019  ? Normocytic anemia 05/29/2018  ? Scleroderma ([place]) 04/22/2018  ? Swelling of foot joint, right 04/08/2018  ? Positive ANA (antinuclear antibody) 09/23/2016  ? Eczema 01/27/2016  ? History of pulmonary embolism 09/12/2015  ? Long term current use of anticoagulant therapy 09/06/2015  ? Chemotherapy-induced peripheral neuropathy ([place]) 06/13/2015  ? CKD (chronic kidney disease) stage 3, GFR 30-59 ml/min (HCC) 05/12/2015  ? GERD (gastroesophageal reflux disease) 05/12/2015  ? Ovarian carcinosarcoma, right ([place]) 03/29/2015  ? Endometrial ca [facility]) 03/29/2015  ? Ventral hernia without obstruction or gangrene   ? Morbid obesity ([place]) 02/19/2015  ? Essential hypertension, benign   ? Stress at home 12/14/2014  ? Left knee pain 08/04/2013  ? Seasonal allergies 07/29/2013  ? ? ?REFERRING DIAG:  M54.6 (ICD-10-CM) - Acute right-sided thoracic back pain  ? ?THERAPY DIAG:  ?Chronic bilateral low back pain without sciatica - Plan: PT plan of care cert/re-cert ? ?Pain in thoracic spine - Plan: PT plan of care cert/re-cert ? ?Muscle weakness (generalized) - Plan: PT plan of care cert/re-cert ? ?PERTINENT HISTORY:  ?Currently in remission from endometrial and ovarian cancer  ? ?PRECAUTIONS: None ? ?SUBJECTIVE:  ?Pt presents to PT with reports of continued lower and mid back pain. She has been compliant with HEP with no adverse effect. Pt is ready to begin PT at this time.  ? ?Pain: ?Are you having pain? Yes ?NPRS scale: 5/10 ?Pain location: lower back and bilateral LE ?PAIN TYPE: aching ?Pain description: intermittent  ?Aggravating factors: pulling, lifting, reaching, prolonged standing ?Relieving factors: rest  ? ? ?OBJECTIVE:  ? *Unless otherwise noted all objective measures were captured  on initial evaluation.  ? ?DIAGNOSTIC FINDINGS:  ?N/A ?  ?PATIENT SURVEYS:  ?FOTO 36% function to 56% predicted  ?05/30/21: 52% function  ?07/24/2021: 44% function ?  ?LUMBARAROM/PROM ?  ?A/PROM A/PROM  ?04/29/2021  ?Flexion Full; increased pain low back  ?Extension Full; decreases pain   ?Right lateral flexion 25% limited   ?Left lateral flexion 50% limited, pulling Rt side low back  ?Right rotation 25% limited   ?Left rotation 50% limited pulling Rt side low back   ? (Blank rows = not tested) ?  ?  ?LE MMT: ?  ?MMT Right ?04/29/2021 Left ?04/29/2021 Right ?07/24/2021 Left ?07/24/2021  ?Hip flexion 4; pn low back  4+    ?Hip extension        ?Hip abduction 3+; pn low back  3+    ? (Blank rows = not tested) ?  ?FUNCTIONAL TESTS:  ?5 times sit to stand: 13 seconds - 07/24/2021 ?   ?  ?  TODAY'S TREATMENT  ?[facility] Adult PT Treatment:                                                DATE: 07/24/2021 ?Therapeutic Exercise: ?NuStep lvl 6 UE/LE x 4 min while taking subjective ?Row 3x10 17# ?Shoulder ext 2x10 17# ?Seated horizontal abd 2x15 BTB ?Seated physioball rollout x 15 - 5" hold ?LTR x 10 each ?Supine PPT clamshell 3x20 Black TB ?Supine PPT with march 2x20 ?Supine SLR 2x15 (small range with pulse) ?Abdominal curl up 3x10 ?LAQ 3x10 6# ?Seated march 2x20 6# ?STS 2x10 - elevated table  ?Standing hip abd 2x10 25#  ? ?[facility] Adult PT Treatment:                                                DATE: 07/11/2021 ?Therapeutic Exercise: ?NuStep lvl 5 UE/LE x 5 min while taking subjective ?Seated physioball rollout 2x15 - 5" hold ?Supine PPT clamshell 3x20 Black TB ?Supine PPT with march 2x20 ?Supine 90/90 hold 4x5" hold ?LTR x 10 ?Supine SLR 2x10 (small range with pulse) ?Abdominal curl up 3x10 ?Seated row 3x15 Black TB ?Seated horizontal abd 3x10 BTB ?LAQ 3x10 6# ?Seated march 2x20 6# ?STS 2x10 - elevated table  ?Standing hip abd 2x10 25#  ? ?[facility] Adult PT Treatment:                                                DATE: 07/04/2021 ?Therapeutic  Exercise: ?NuStep lvl 5 UE/LE x 5 min while taking subjective ?Seated physioball rollout x 15 - 5" hold ?Supine PPT with ball squeeze 2x15 - 5" hold ?Supine PPT clamshell 3x20 Black TB ?Supine PPT march 2x20

## 2021-07-25 NOTE — Therapy (Signed)
?OUTPATIENT PHYSICAL THERAPY TREATMENT NOTE ? ? ?Patient Name: [name] ?MRN: [id number] ?DOB:12/17/1965, 56 y.o., female ?Today's Date: 07/26/2021 ? ?PCP: Leeanne Rio, MD ?REFERRING PROVIDER: Rise Patience, DO ? ? PT End of Session - 07/26/21 1301   ? ? Visit Number 17   ? Number of Visits 30   ? Date for PT Re-Evaluation 09/18/21   ? Authorization Type [employer] primary; MCD secondary   ? Authorization Time Period Approved 12 PT visits 06/12/2021-07/26/2021   ? PT Start Time 1300   ? PT Stop Time 0350   ? PT Time Calculation (min) 45 min   ? Activity Tolerance Patient tolerated treatment well   ? Behavior During Therapy [facility] for tasks assessed/performed   ? ?  ?  ? ?  ? ? ? ? ? ? ? ? ? ? ? ? ?Past Medical History:  ?Diagnosis Date  ? Allergy   ? Anemia   ? Anxiety   ? Arthritis   ? Blood transfusion without reported diagnosis   ? Cough   ? Cough 06/11/2018  ? Difficulty sleeping 06/11/2018  ? Diverticulitis with perforation 2010  ? DVT (deep vein thrombosis) in pregnancy 11/24/2016  ? DVT, lower extremity ([place])   ? Endometrial cancer ([place])   ? Enlarged lymph nodes 08/13/2017  ? Family history of adverse reaction to anesthesia   ? pts daughter had difficulty awakening following anesthesia, long time to wake up  ? GERD (gastroesophageal reflux disease)   ? Headache   ? Hemoptysis 04/22/2018  ? History of bronchitis   ? History of ear infections   ? Hospital discharge follow-up 03/02/2019  ? Hx of migraines   ? Hyperlipidemia   ? Hypertension   ? IBS (irritable bowel syndrome)   ? Kidney infection   ? Lupus (systemic lupus erythematosus) ([place]) 02/2017  ? Morbid obesity ([place])   ? Neck pain 01/13/2019  ? Ovarian cancer ([place]) dx'd 01/2015  ? PONV (postoperative nausea and vomiting)   ? Port-A-Cath in place   ? Right side  ? Portacath in place 09/12/2015  ? Pre-diabetes   ? Pyelonephritis   ? Right knee pain 11/28/2011  ? Scleroderma ([place])   ? Screen for colon cancer 03/02/2019  ? Slow rate of speech 08/22/2015  ?  Chronic from CVA.  She also has loss of left nasolabial fold and slight left facial droop/small asymmetry on the left side secondary to CVA  ? UTI (urinary tract infection) 05/07/2018  ? ?Past Surgical History:  ?Procedure Laterality Date  ? ABDOMINAL HYSTERECTOMY N/A 03/13/2015  ? Procedure: TOTAL HYSTERECTOMY ABDOMINAL BILATERAL SALPINGO OOPHORECTOMY RADICAL TUMOR [place];  Surgeon: Everitt Amber, MD;  [facility];  Service: Gynecology;  Laterality: N/A;  ? BOWEL RESECTION  03/13/2015  ? Procedure: SMALL BOWEL RESECTION;  Surgeon: Everitt Amber, MD;  [facility];  Service: Gynecology;;  ? [place] and 1984  ? COLONOSCOPY WITH PROPOFOL N/A 07/19/2020  ? Procedure: COLONOSCOPY WITH PROPOFOL;  Surgeon: Doran Stabler, MD;  [facility];  Service: Gastroenterology;  Laterality: N/A;  ? COLOSTOMY  2008  ? COLOSTOMY TAKEDOWN    ? ESOPHAGOGASTRODUODENOSCOPY (EGD) WITH PROPOFOL N/A 12/21/2018  ? Procedure: ESOPHAGOGASTRODUODENOSCOPY (EGD) WITH PROPOFOL;  Surgeon: Doran Stabler, MD;  [facility];  Service: Gastroenterology;  Laterality: N/A;  ? HEMOSTASIS CLIP PLACEMENT  07/19/2020  ? Procedure: HEMOSTASIS CLIP PLACEMENT;  Surgeon: Doran Stabler, MD;  Location[facility];  Service: Gastroenterology;;  ?  LAPAROTOMY N/A 03/13/2015  ? Procedure: EXPLORATORY LAPAROTOMY;  Surgeon: Everitt Amber, MD;  [facility];  Service: Gynecology;  Laterality: N/A;  ? POLYPECTOMY  07/19/2020  ? Procedure: POLYPECTOMY;  Surgeon: Doran Stabler, MD;  Location[facility];  Service: Gastroenterology;;  ? [place]  ? VENTRAL HERNIA REPAIR  03/13/2015  ? Procedure: HERNIA REPAIR VENTRAL ADULT;  Surgeon: Everitt Amber, MD;  [facility];  Service: Gynecology;;  ? ?Patient Active Problem List  ? Diagnosis Date Noted  ? Viral illness 06/05/2021  ? Chronic diastolic CHF (congestive heart failure) ([place]) 07/19/2020  ? Bronchospasm 07/19/2020  ? Incontinence  03/17/2020  ? Herpes simplex virus (HSV) infection of buttock 01/05/2020  ? OSA (obstructive sleep apnea) 07/25/2019  ? Prediabetes 07/25/2019  ? Lateral epicondylitis of left elbow 01/13/2019  ? Normocytic anemia 05/29/2018  ? Scleroderma ([place]) 04/22/2018  ? Swelling of foot joint, right 04/08/2018  ? Positive ANA (antinuclear antibody) 09/23/2016  ? Eczema 01/27/2016  ? History of pulmonary embolism 09/12/2015  ? Long term current use of anticoagulant therapy 09/06/2015  ? Chemotherapy-induced peripheral neuropathy ([place]) 06/13/2015  ? CKD (chronic kidney disease) stage 3, GFR 30-59 ml/min (HCC) 05/12/2015  ? GERD (gastroesophageal reflux disease) 05/12/2015  ? Ovarian carcinosarcoma, right ([place]) 03/29/2015  ? Endometrial ca [facility]) 03/29/2015  ? Ventral hernia without obstruction or gangrene   ? Morbid obesity ([place]) 02/19/2015  ? Essential hypertension, benign   ? Stress at home 12/14/2014  ? Left knee pain 08/04/2013  ? Seasonal allergies 07/29/2013  ? ? ?REFERRING DIAG:  M54.6 (ICD-10-CM) - Acute right-sided thoracic back pain  ? ?THERAPY DIAG:  ?Chronic bilateral low back pain without sciatica ? ?Pain in thoracic spine ? ?Muscle weakness (generalized) ? ?PERTINENT HISTORY:  ?Currently in remission from endometrial and ovarian cancer  ? ?PRECAUTIONS: None ? ?SUBJECTIVE: Patient presents to aquatics with mild to moderate levels of LBP. She reports that she felt good after her last land session. ? ?Pain: ?Are you having pain? Yes ?NPRS scale: 4/10 ?Pain location: lower back and bilateral LE ?PAIN TYPE: aching ?Pain description: intermittent  ?Aggravating factors: pulling, lifting, reaching, prolonged standing ?Relieving factors: rest  ? ? ?OBJECTIVE:  ? *Unless otherwise noted all objective measures were captured on initial evaluation.  ? ?DIAGNOSTIC FINDINGS:  ?N/A ?  ?PATIENT SURVEYS:  ?FOTO 36% function to 56% predicted  ?05/30/21: 52% function  ?07/24/2021: 44% function ?  ?LUMBARAROM/PROM ?  ?A/PROM A/PROM   ?04/29/2021  ?Flexion Full; increased pain low back  ?Extension Full; decreases pain   ?Right lateral flexion 25% limited   ?Left lateral flexion 50% limited, pulling Rt side low back  ?Right rotation 25% limited   ?Left rotation 50% limited pulling Rt side low back   ? (Blank rows = not tested) ?  ?  ?LE MMT: ?  ?MMT Right ?04/29/2021 Left ?04/29/2021 Right ?07/24/2021 Left ?07/24/2021  ?Hip flexion 4; pn low back  4+    ?Hip extension        ?Hip abduction 3+; pn low back  3+    ? (Blank rows = not tested) ?  ?FUNCTIONAL TESTS:  ?5 times sit to stand: 13 seconds - 07/24/2021 ?   ?  ?TODAY'S TREATMENT  ?[facility] Adult PT Treatment:  DATE: 07/26/2021 ?Aquatic therapy at [address] - therapeutic pool temp 93 degrees ?Pt enters building ambulating independently with no AD  Treatment took place in water 3.8 to  4 ft 8 in.feet deep depending upon activity.  Pt entered and exited the pool via stair and handrails independently.  ?Pt pain level 4/10 at initiation of water walking. ? ?Therapeutic Exercise: ?Walking forward, backward and side stepping 4 widths ea ?Runners stretch on bottom step x30" BIL ?Hamstring stretch on bottom step x30" BIL ?Figure 4 squat stretch using bil UE support 2x30" BIL ?Standing thoracic rotation with hands in neutral to create resistance ?Forward lunge walking x2 laps ?Side stepping lunge walking x2 laps ?At edge of pool, pt performed LE exercise: ?Hip abd/add R/L 20 x each ?Hip ext/flex with knee straight x 20, VC to not ?Hip Circles bil CC/CCW x10 each BIL ?Marching hip flexion to knee extension 2x10 BIL ?Hamstring curl R/L x 20 ?Squats x 20 reps without UE support x 2 sets ?Heel raises x20 ?STS from 3 step (bottom) x10 occasional UE support ?Seated on bench in water: ?Flutter kicking x1' ?Scissor kicking x1' ?Bicycle kicking x1' ?Reverse bicycle kicking x1' ?Kickboard push/pull x1' ?Kickboard pushdowns x1' ? ?Pt requires the buoyancy  of water for active assisted exercises with buoyancy supported for strengthening and AROM exercises. Hydrostatic pressure also supports joints by unweighting joint load by at least 50 % in 3-4 feet depth water. 80% in che

## 2021-07-26 ENCOUNTER — Ambulatory Visit: Payer: Medicare HMO

## 2021-07-26 DIAGNOSIS — M6281 Muscle weakness (generalized): Secondary | ICD-10-CM

## 2021-07-26 DIAGNOSIS — G8929 Other chronic pain: Secondary | ICD-10-CM

## 2021-07-26 DIAGNOSIS — M545 Low back pain, unspecified: Secondary | ICD-10-CM | POA: Diagnosis not present

## 2021-07-26 DIAGNOSIS — M546 Pain in thoracic spine: Secondary | ICD-10-CM

## 2021-07-31 ENCOUNTER — Ambulatory Visit: Payer: Medicare HMO

## 2021-08-02 ENCOUNTER — Ambulatory Visit: Payer: Medicare HMO

## 2021-08-07 ENCOUNTER — Ambulatory Visit: Payer: Medicare HMO

## 2021-08-09 ENCOUNTER — Ambulatory Visit: Payer: Medicare HMO

## 2021-08-14 ENCOUNTER — Ambulatory Visit: Payer: Medicare HMO | Attending: Family Medicine

## 2021-08-14 DIAGNOSIS — G8929 Other chronic pain: Secondary | ICD-10-CM | POA: Insufficient documentation

## 2021-08-14 DIAGNOSIS — M545 Low back pain, unspecified: Secondary | ICD-10-CM | POA: Diagnosis present

## 2021-08-14 DIAGNOSIS — M6281 Muscle weakness (generalized): Secondary | ICD-10-CM | POA: Diagnosis present

## 2021-08-14 DIAGNOSIS — M546 Pain in thoracic spine: Secondary | ICD-10-CM | POA: Diagnosis present

## 2021-08-14 NOTE — Therapy (Signed)
?OUTPATIENT PHYSICAL THERAPY TREATMENT NOTE ? ? ?Patient Name: [name] ?MRN: [id number] ?DOB:[birth year], 56 y.o., female ?Today's Date: 08/14/2021 ? ?PCP: Leeanne Rio, MD ?REFERRING PROVIDER: Rise Patience, DO ? ? PT End of Session - 08/14/21 1048   ? ? Visit Number 18   ? Number of Visits 30   ? Date for PT Re-Evaluation 09/18/21   ? Authorization Type [employer] primary; MCD secondary   ? Authorization Time Period Approved 12 PT visits 06/12/2021-07/26/2021   ? PT Start Time 1045   ? PT Stop Time 1123   ? PT Time Calculation (min) 38 min   ? Activity Tolerance Patient tolerated treatment well   ? Behavior During Therapy [facility] for tasks assessed/performed   ? ?  ?  ? ?  ? ? ? ? ? ? ? ? ? ? ? ? ? ?Past Medical History:  ?Diagnosis Date  ? Allergy   ? Anemia   ? Anxiety   ? Arthritis   ? Blood transfusion without reported diagnosis   ? Cough   ? Cough 06/11/2018  ? Difficulty sleeping 06/11/2018  ? Diverticulitis with perforation 2010  ? DVT (deep vein thrombosis) in pregnancy 11/24/2016  ? DVT, lower extremity ([place])   ? Endometrial cancer ([place])   ? Enlarged lymph nodes 08/13/2017  ? Family history of adverse reaction to anesthesia   ? pts daughter had difficulty awakening following anesthesia, long time to wake up  ? GERD (gastroesophageal reflux disease)   ? Headache   ? Hemoptysis 04/22/2018  ? History of bronchitis   ? History of ear infections   ? Hospital discharge follow-up 03/02/2019  ? Hx of migraines   ? Hyperlipidemia   ? Hypertension   ? IBS (irritable bowel syndrome)   ? Kidney infection   ? Lupus (systemic lupus erythematosus) ([place]) 02/2017  ? Morbid obesity ([place])   ? Neck pain 01/13/2019  ? Ovarian cancer ([place]) dx'd 01/2015  ? PONV (postoperative nausea and vomiting)   ? Port-A-Cath in place   ? Right side  ? Portacath in place 09/12/2015  ? Pre-diabetes   ? Pyelonephritis   ? Right knee pain 11/28/2011  ? Scleroderma ([place])   ? Screen for colon cancer 03/02/2019  ? Slow rate of speech 08/22/2015  ?  Chronic from CVA.  She also has loss of left nasolabial fold and slight left facial droop/small asymmetry on the left side secondary to CVA  ? UTI (urinary tract infection) 05/07/2018  ? ?Past Surgical History:  ?Procedure Laterality Date  ? ABDOMINAL HYSTERECTOMY N/A 03/13/2015  ? Procedure: TOTAL HYSTERECTOMY ABDOMINAL BILATERAL SALPINGO OOPHORECTOMY RADICAL TUMOR [place];  Surgeon: Everitt Amber, MD;  Location: [facility];  Service: Gynecology;  Laterality: N/A;  ? BOWEL RESECTION  03/13/2015  ? Procedure: SMALL BOWEL RESECTION;  Surgeon: Everitt Amber, MD;  Location: [facility];  Service: Gynecology;;  ? [employer] and 1984  ? COLONOSCOPY WITH PROPOFOL N/A 07/19/2020  ? Procedure: COLONOSCOPY WITH PROPOFOL;  Surgeon: Doran Stabler, MD;  [facility];  Service: Gastroenterology;  Laterality: N/A;  ? COLOSTOMY  2008  ? COLOSTOMY TAKEDOWN    ? ESOPHAGOGASTRODUODENOSCOPY (EGD) WITH PROPOFOL N/A 12/21/2018  ? Procedure: ESOPHAGOGASTRODUODENOSCOPY (EGD) WITH PROPOFOL;  Surgeon: Doran Stabler, MD;  [facility];  Service: Gastroenterology;  Laterality: N/A;  ? HEMOSTASIS CLIP PLACEMENT  07/19/2020  ? Procedure: HEMOSTASIS CLIP PLACEMENT;  Surgeon: Doran Stabler, MD;  [facility];  Service:  Gastroenterology;;  ? LAPAROTOMY N/A 03/13/2015  ? Procedure: EXPLORATORY LAPAROTOMY;  Surgeon: Everitt Amber, MD;  [facility];  Service: Gynecology;  Laterality: N/A;  ? POLYPECTOMY  07/19/2020  ? Procedure: POLYPECTOMY;  Surgeon: Doran Stabler, MD;  [facility];  Service: Gastroenterology;;  ? [place]  ? VENTRAL HERNIA REPAIR  03/13/2015  ? Procedure: HERNIA REPAIR VENTRAL ADULT;  Surgeon: Everitt Amber, MD;  [facility];  Service: Gynecology;;  ? ?Patient Active Problem List  ? Diagnosis Date Noted  ? Viral illness 06/05/2021  ? Chronic diastolic CHF (congestive heart failure) ([place]) 07/19/2020  ? Bronchospasm 07/19/2020  ? Incontinence  03/17/2020  ? Herpes simplex virus (HSV) infection of buttock 01/05/2020  ? OSA (obstructive sleep apnea) 07/25/2019  ? Prediabetes 07/25/2019  ? Lateral epicondylitis of left elbow 01/13/2019  ? Normocytic anemia 05/29/2018  ? Scleroderma ([place]) 04/22/2018  ? Swelling of foot joint, right 04/08/2018  ? Positive ANA (antinuclear antibody) 09/23/2016  ? Eczema 01/27/2016  ? History of pulmonary embolism 09/12/2015  ? Long term current use of anticoagulant therapy 09/06/2015  ? Chemotherapy-induced peripheral neuropathy ([place]) 06/13/2015  ? CKD (chronic kidney disease) stage 3, GFR 30-59 ml/min (HCC) 05/12/2015  ? GERD (gastroesophageal reflux disease) 05/12/2015  ? Ovarian carcinosarcoma, right ([place]) 03/29/2015  ? Endometrial ca [facility]) 03/29/2015  ? Ventral hernia without obstruction or gangrene   ? Morbid obesity ([place]) 02/19/2015  ? Essential hypertension, benign   ? Stress at home 12/14/2014  ? Left knee pain 08/04/2013  ? Seasonal allergies 07/29/2013  ? ? ?REFERRING DIAG:  M54.6 (ICD-10-CM) - Acute right-sided thoracic back pain  ? ?THERAPY DIAG:  ?Chronic bilateral low back pain without sciatica ? ?Pain in thoracic spine ? ?PERTINENT HISTORY:  ?Currently in remission from endometrial and ovarian cancer  ? ?PRECAUTIONS: None ? ?SUBJECTIVE:  ?Pt presents to PT with continued reports of pain and discomfort. She has been compliant with HEP with no adverse effect. She is ready to begin PT at this time.  ? ?Pain: ?Are you having pain? Yes ?NPRS scale: 4/10 ?Pain location: lower back and bilateral LE ?PAIN TYPE: aching ?Pain description: intermittent  ?Aggravating factors: pulling, lifting, reaching, prolonged standing ?Relieving factors: rest  ? ? ?OBJECTIVE:  ? *Unless otherwise noted all objective measures were captured on initial evaluation.  ? ?DIAGNOSTIC FINDINGS:  ?N/A ?  ?PATIENT SURVEYS:  ?FOTO 36% function to 56% predicted  ?05/30/21: 52% function  ?07/24/2021: 44% function ?  ?LUMBARAROM/PROM ?  ?A/PROM A/PROM   ?04/29/2021  ?Flexion Full; increased pain low back  ?Extension Full; decreases pain   ?Right lateral flexion 25% limited   ?Left lateral flexion 50% limited, pulling Rt side low back  ?Right rotation 25% limited   ?Left rotation 50% limited pulling Rt side low back   ? (Blank rows = not tested) ?  ?  ?LE MMT: ?  ?MMT Right ?04/29/2021 Left ?04/29/2021 Right ?07/24/2021 Left ?07/24/2021  ?Hip flexion 4; pn low back  4+    ?Hip extension        ?Hip abduction 3+; pn low back  3+    ? (Blank rows = not tested) ?  ?FUNCTIONAL TESTS:  ?5 times sit to stand: 13 seconds - 07/24/2021 ?   ?  ?TODAY'S TREATMENT  ?[facility] Adult PT Treatment:  DATE: 08/14/2021 ?Therapeutic Exercise: ?NuStep lvl 6 UE/LE x 5 min while taking subjective ?Paloff press 2x10 7# ?Row 3x10 17# ?Seated horizontal abd 2x15 BTB ?STS 2x10 - elevated table 10# KB ?Seated physioball rollout x 15 - 5" hold ?LTR x 10 each ?Supine PPT with march 2x20 ?Hooklying hundreds 2x30 ?Supine SLR 2x15 (small range with pulse) ?Abdominal curl up 3x10 ?LAQ 3x10 6# ?Seated march 2x20 6# ?Standing hip abd 2x10 30#  ? ?[facility] Adult PT Treatment:                                                DATE: 07/26/2021 ?Aquatic therapy at [address] - therapeutic pool temp 93 degrees ?Pt enters building ambulating independently with no AD  Treatment took place in water 3.8 to  4 ft 8 in.feet deep depending upon activity.  Pt entered and exited the pool via stair and handrails independently.  ?Pt pain level 4/10 at initiation of water walking. ? ?Therapeutic Exercise: ?Walking forward, backward and side stepping 4 widths ea ?Runners stretch on bottom step x30" BIL ?Hamstring stretch on bottom step x30" BIL ?Figure 4 squat stretch using bil UE support 2x30" BIL ?Standing thoracic rotation with hands in neutral to create resistance ?Forward lunge walking x2 laps ?Side stepping lunge walking x2 laps ?At edge of pool, pt performed LE  exercise: ?Hip abd/add R/L 20 x each ?Hip ext/flex with knee straight x 20, VC to not ?Hip Circles bil CC/CCW x10 each BIL ?Marching hip flexion to knee extension 2x10 BIL ?Hamstring curl R/L x 20 ?Squats x 20 reps wi

## 2021-08-15 NOTE — Therapy (Signed)
?OUTPATIENT PHYSICAL THERAPY TREATMENT NOTE ? ? ?Patient Name: [name] ?MRN: [id number] ?DOB:[birth year], 55 y.o., female ?Today's Date: 08/16/2021 ? ?PCP: Leeanne Rio, MD ?REFERRING PROVIDER: Leeanne Rio, MD ? ? PT End of Session - 08/16/21 1145   ? ? Visit Number 19   ? Number of Visits 30   ? Date for PT Re-Evaluation 09/18/21   ? Authorization Type [employer] primary; MCD secondary   ? Authorization Time Period Approved 12 visits 07/24/2021-09/06/2021   ? Authorization - Visit Number 4   ? Authorization - Number of Visits 12   ? Activity Tolerance Patient tolerated treatment well   ? Behavior During Therapy [facility] for tasks assessed/performed   ? ?  ?  ? ?  ? ? ? ? ? ? ? ? ? ? ? ? ? ? ?Past Medical History:  ?Diagnosis Date  ? Allergy   ? Anemia   ? Anxiety   ? Arthritis   ? Blood transfusion without reported diagnosis   ? Cough   ? Cough 06/11/2018  ? Difficulty sleeping 06/11/2018  ? Diverticulitis with perforation 2010  ? DVT (deep vein thrombosis) in pregnancy 11/24/2016  ? DVT, lower extremity ([place])   ? Endometrial cancer ([place])   ? Enlarged lymph nodes 08/13/2017  ? Family history of adverse reaction to anesthesia   ? pts daughter had difficulty awakening following anesthesia, long time to wake up  ? GERD (gastroesophageal reflux disease)   ? Headache   ? Hemoptysis 04/22/2018  ? History of bronchitis   ? History of ear infections   ? Hospital discharge follow-up 03/02/2019  ? Hx of migraines   ? Hyperlipidemia   ? Hypertension   ? IBS (irritable bowel syndrome)   ? Kidney infection   ? Lupus (systemic lupus erythematosus) ([place]) 02/2017  ? Morbid obesity ([place])   ? Neck pain 01/13/2019  ? Ovarian cancer ([place]) dx'd 01/2015  ? PONV (postoperative nausea and vomiting)   ? Port-A-Cath in place   ? Right side  ? Portacath in place 09/12/2015  ? Pre-diabetes   ? Pyelonephritis   ? Right knee pain 11/28/2011  ? Scleroderma ([place])   ? Screen for colon cancer 03/02/2019  ? Slow rate of speech 08/22/2015  ? Chronic  from CVA.  She also has loss of left nasolabial fold and slight left facial droop/small asymmetry on the left side secondary to CVA  ? UTI (urinary tract infection) 05/07/2018  ? ?Past Surgical History:  ?Procedure Laterality Date  ? ABDOMINAL HYSTERECTOMY N/A 03/13/2015  ? Procedure: TOTAL HYSTERECTOMY ABDOMINAL BILATERAL SALPINGO OOPHORECTOMY RADICAL TUMOR [place];  Surgeon: Everitt Amber, MD;  [facility];  Service: Gynecology;  Laterality: N/A;  ? BOWEL RESECTION  03/13/2015  ? Procedure: SMALL BOWEL RESECTION;  Surgeon: Everitt Amber, MD;  [facility];  Service: Gynecology;;  ? [place] and 1984  ? COLONOSCOPY WITH PROPOFOL N/A 07/19/2020  ? Procedure: COLONOSCOPY WITH PROPOFOL;  Surgeon: Doran Stabler, MD;  [facility];  Service: Gastroenterology;  Laterality: N/A;  ? COLOSTOMY  2008  ? COLOSTOMY TAKEDOWN    ? ESOPHAGOGASTRODUODENOSCOPY (EGD) WITH PROPOFOL N/A 12/21/2018  ? Procedure: ESOPHAGOGASTRODUODENOSCOPY (EGD) WITH PROPOFOL;  Surgeon: Doran Stabler, MD;  [facility];  Service: Gastroenterology;  Laterality: N/A;  ? HEMOSTASIS CLIP PLACEMENT  07/19/2020  ? Procedure: HEMOSTASIS CLIP PLACEMENT;  Surgeon: Doran Stabler, MD;  Location[facility];  Service: Gastroenterology;;  ? LAPAROTOMY N/A  03/13/2015  ? Procedure: EXPLORATORY LAPAROTOMY;  Surgeon: Everitt Amber, MD;  [facility];  Service: Gynecology;  Laterality: N/A;  ? POLYPECTOMY  07/19/2020  ? Procedure: POLYPECTOMY;  Surgeon: Doran Stabler, MD;  Location[facility];  Service: Gastroenterology;;  ? [place]  ? VENTRAL HERNIA REPAIR  03/13/2015  ? Procedure: HERNIA REPAIR VENTRAL ADULT;  Surgeon: Everitt Amber, MD;  [facility];  Service: Gynecology;;  ? ?Patient Active Problem List  ? Diagnosis Date Noted  ? Viral illness 06/05/2021  ? Chronic diastolic CHF (congestive heart failure) ([place]) 07/19/2020  ? Bronchospasm 07/19/2020  ? Incontinence 03/17/2020  ?  Herpes simplex virus (HSV) infection of buttock 01/05/2020  ? OSA (obstructive sleep apnea) 07/25/2019  ? Prediabetes 07/25/2019  ? Lateral epicondylitis of left elbow 01/13/2019  ? Normocytic anemia 05/29/2018  ? Scleroderma ([place]) 04/22/2018  ? Swelling of foot joint, right 04/08/2018  ? Positive ANA (antinuclear antibody) 09/23/2016  ? Eczema 01/27/2016  ? History of pulmonary embolism 09/12/2015  ? Long term current use of anticoagulant therapy 09/06/2015  ? Chemotherapy-induced peripheral neuropathy ([place]) 06/13/2015  ? CKD (chronic kidney disease) stage 3, GFR 30-59 ml/min (HCC) 05/12/2015  ? GERD (gastroesophageal reflux disease) 05/12/2015  ? Ovarian carcinosarcoma, right ([place]) 03/29/2015  ? Endometrial ca [facility]) 03/29/2015  ? Ventral hernia without obstruction or gangrene   ? Morbid obesity ([place]) 02/19/2015  ? Essential hypertension, benign   ? Stress at home 12/14/2014  ? Left knee pain 08/04/2013  ? Seasonal allergies 07/29/2013  ? ? ?REFERRING DIAG:  M54.6 (ICD-10-CM) - Acute right-sided thoracic back pain  ? ?THERAPY DIAG:  ?Chronic bilateral low back pain without sciatica ? ?Pain in thoracic spine ? ?Muscle weakness (generalized) ? ?PERTINENT HISTORY:  ?Currently in remission from endometrial and ovarian cancer  ? ?PRECAUTIONS: None ? ?SUBJECTIVE:  Patient reports muscle soreness from last session. She stated that she needed to schedule another land appointment for next week as she only had an aquatic scheduled.  ? ?Pain: ?Are you having pain? Yes ?NPRS scale: 5/10 ?Pain location: lower back and bilateral LE ?PAIN TYPE: aching ?Pain description: intermittent  ?Aggravating factors: pulling, lifting, reaching, prolonged standing ?Relieving factors: rest  ? ? ?OBJECTIVE:  ? *Unless otherwise noted all objective measures were captured on initial evaluation.  ? ?DIAGNOSTIC FINDINGS:  ?N/A ?  ?PATIENT SURVEYS:  ?FOTO 36% function to 56% predicted  ?05/30/21: 52% function  ?07/24/2021: 44% function ?   ?LUMBARAROM/PROM ?  ?A/PROM A/PROM  ?04/29/2021  ?Flexion Full; increased pain low back  ?Extension Full; decreases pain   ?Right lateral flexion 25% limited   ?Left lateral flexion 50% limited, pulling Rt side low back  ?Right rotation 25% limited   ?Left rotation 50% limited pulling Rt side low back   ? (Blank rows = not tested) ?  ?  ?LE MMT: ?  ?MMT Right ?04/29/2021 Left ?04/29/2021 Right ?07/24/2021 Left ?07/24/2021  ?Hip flexion 4; pn low back  4+    ?Hip extension        ?Hip abduction 3+; pn low back  3+    ? (Blank rows = not tested) ?  ?FUNCTIONAL TESTS:  ?5 times sit to stand: 13 seconds - 07/24/2021 ?   ?  ?TODAY'S TREATMENT  ?[facility] Adult PT Treatment:  DATE: 08/16/2021 ?Aquatic therapy at [address] - therapeutic pool temp 90 degrees. Pt enters building ambulating independently with no AD  Treatment took place in water 3.8 to  4 ft 8 in.feet deep depending upon activity.  Pt entered and exited the pool via stair and handrails independently.  ?Pt pain level 5/10 at initiation of water walking. ? ?Therapeutic Exercise: ?Walking forward, backward and side stepping 4 widths ea ?Runners stretch on bottom step x30" BIL ?Hamstring stretch on bottom step x30" BIL ?Figure 4 squat stretch using bil UE support 2x30" BIL ?Standing thoracic rotation with hands in neutral to create resistance ?Forward lunge walking x2 laps ?Side stepping lunge walking x2 laps ?At edge of pool, pt performed LE exercise: ?Hip abd/add R/L 20 x each ?Hip ext/flex with knee straight x 20 ?Hip Circles bil CC/CCW x10 each BIL ?Marching hip flexion to knee extension 2x10 BIL ?Hamstring curl R/L x 20 ?Squats x 20 reps holding noodle x 2 sets ?Heel raises x20 ?Seated on bench in water: ?Flutter kicking x1' ?Scissor kicking x1' ?Bicycle kicking x1' ?Reverse bicycle kicking x1' ?Kickboard push/pull x1' ?Kickboard pushdowns x1' ? ?Pt requires the buoyancy of water for active assisted  exercises with buoyancy supported for strengthening and AROM exercises. Hydrostatic pressure also supports joints by unweighting joint load by at least 50 % in 3-4 feet depth water. 80% in chest to neck deep water. [place]

## 2021-08-16 ENCOUNTER — Ambulatory Visit: Payer: Medicare HMO

## 2021-08-16 DIAGNOSIS — M546 Pain in thoracic spine: Secondary | ICD-10-CM

## 2021-08-16 DIAGNOSIS — G8929 Other chronic pain: Secondary | ICD-10-CM

## 2021-08-16 DIAGNOSIS — M6281 Muscle weakness (generalized): Secondary | ICD-10-CM

## 2021-08-16 DIAGNOSIS — M545 Low back pain, unspecified: Secondary | ICD-10-CM | POA: Diagnosis not present

## 2021-08-19 ENCOUNTER — Ambulatory Visit: Payer: Medicare HMO

## 2021-08-19 DIAGNOSIS — M546 Pain in thoracic spine: Secondary | ICD-10-CM

## 2021-08-19 DIAGNOSIS — M545 Low back pain, unspecified: Secondary | ICD-10-CM

## 2021-08-19 DIAGNOSIS — M6281 Muscle weakness (generalized): Secondary | ICD-10-CM

## 2021-08-19 NOTE — Therapy (Signed)
?OUTPATIENT PHYSICAL THERAPY TREATMENT NOTE ? ? ?Patient Name: [name] ?MRN: [id number] ?DOB:07/05/1965, 56 y.o., female ?Today's Date: 08/19/2021 ? ?PCP: Leeanne Rio, MD ?REFERRING PROVIDER: Rise Patience, DO ? ? PT End of Session - 08/19/21 0831   ? ? Visit Number 20   ? Number of Visits 30   ? Date for PT Re-Evaluation 09/18/21   ? Authorization Type [employer] primary; MCD secondary   ? Authorization Time Period Approved 12 visits 07/24/2021-09/06/2021   ? Authorization - Number of Visits 12   ? Progress Note Due on Visit 26   ? PT Start Time 0831   ? PT Stop Time 0909   ? PT Time Calculation (min) 38 min   ? Activity Tolerance Patient tolerated treatment well   ? Behavior During Therapy [facility] for tasks assessed/performed   ? ?  ?  ? ?  ? ? ? ? ? ? ? ? ? ? ? ? ? ? ? ?Past Medical History:  ?Diagnosis Date  ? Allergy   ? Anemia   ? Anxiety   ? Arthritis   ? Blood transfusion without reported diagnosis   ? Cough   ? Cough 06/11/2018  ? Difficulty sleeping 06/11/2018  ? Diverticulitis with perforation 2010  ? DVT (deep vein thrombosis) in pregnancy 11/24/2016  ? DVT, lower extremity ([place])   ? Endometrial cancer ([place])   ? Enlarged lymph nodes 08/13/2017  ? Family history of adverse reaction to anesthesia   ? pts daughter had difficulty awakening following anesthesia, long time to wake up  ? GERD (gastroesophageal reflux disease)   ? Headache   ? Hemoptysis 04/22/2018  ? History of bronchitis   ? History of ear infections   ? Hospital discharge follow-up 03/02/2019  ? Hx of migraines   ? Hyperlipidemia   ? Hypertension   ? IBS (irritable bowel syndrome)   ? Kidney infection   ? Lupus (systemic lupus erythematosus) ([place]) 02/2017  ? Morbid obesity ([place])   ? Neck pain 01/13/2019  ? Ovarian cancer ([place]) dx'd 01/2015  ? PONV (postoperative nausea and vomiting)   ? Port-A-Cath in place   ? Right side  ? Portacath in place 09/12/2015  ? Pre-diabetes   ? Pyelonephritis   ? Right knee pain 11/28/2011  ? Scleroderma ([place])   ?  Screen for colon cancer 03/02/2019  ? Slow rate of speech 08/22/2015  ? Chronic from CVA.  She also has loss of left nasolabial fold and slight left facial droop/small asymmetry on the left side secondary to CVA  ? UTI (urinary tract infection) 05/07/2018  ? ?Past Surgical History:  ?Procedure Laterality Date  ? ABDOMINAL HYSTERECTOMY N/A 03/13/2015  ? Procedure: TOTAL HYSTERECTOMY ABDOMINAL BILATERAL SALPINGO OOPHORECTOMY RADICAL TUMOR [place];  Surgeon: Everitt Amber, MD;  [facility];  Service: Gynecology;  Laterality: N/A;  ? BOWEL RESECTION  03/13/2015  ? Procedure: SMALL BOWEL RESECTION;  Surgeon: Everitt Amber, MD;  [facility];  Service: Gynecology;;  ? [place] and 1984  ? COLONOSCOPY WITH PROPOFOL N/A 07/19/2020  ? Procedure: COLONOSCOPY WITH PROPOFOL;  Surgeon: Doran Stabler, MD;  [facility];  Service: Gastroenterology;  Laterality: N/A;  ? COLOSTOMY  2008  ? COLOSTOMY TAKEDOWN    ? ESOPHAGOGASTRODUODENOSCOPY (EGD) WITH PROPOFOL N/A 12/21/2018  ? Procedure: ESOPHAGOGASTRODUODENOSCOPY (EGD) WITH PROPOFOL;  Surgeon: Doran Stabler, MD;  [facility];  Service: Gastroenterology;  Laterality: N/A;  ? HEMOSTASIS CLIP PLACEMENT  07/19/2020  ?  Procedure: HEMOSTASIS CLIP PLACEMENT;  Surgeon: Doran Stabler, MD;  Location: [facility];  Service: Gastroenterology;;  ? LAPAROTOMY N/A 03/13/2015  ? Procedure: EXPLORATORY LAPAROTOMY;  Surgeon: Everitt Amber, MD;  [facility];  Service: Gynecology;  Laterality: N/A;  ? POLYPECTOMY  07/19/2020  ? Procedure: POLYPECTOMY;  Surgeon: Doran Stabler, MD;  Location: [facility];  Service: Gastroenterology;;  ? [place]  ? VENTRAL HERNIA REPAIR  03/13/2015  ? Procedure: HERNIA REPAIR VENTRAL ADULT;  Surgeon: Everitt Amber, MD;  [facility];  Service: Gynecology;;  ? ?Patient Active Problem List  ? Diagnosis Date Noted  ? Viral illness 06/05/2021  ? Chronic diastolic CHF (congestive heart  failure) ([place]) 07/19/2020  ? Bronchospasm 07/19/2020  ? Incontinence 03/17/2020  ? Herpes simplex virus (HSV) infection of buttock 01/05/2020  ? OSA (obstructive sleep apnea) 07/25/2019  ? Prediabetes 07/25/2019  ? Lateral epicondylitis of left elbow 01/13/2019  ? Normocytic anemia 05/29/2018  ? Scleroderma ([place]) 04/22/2018  ? Swelling of foot joint, right 04/08/2018  ? Positive ANA (antinuclear antibody) 09/23/2016  ? Eczema 01/27/2016  ? History of pulmonary embolism 09/12/2015  ? Long term current use of anticoagulant therapy 09/06/2015  ? Chemotherapy-induced peripheral neuropathy ([place]) 06/13/2015  ? CKD (chronic kidney disease) stage 3, GFR 30-59 ml/min (HCC) 05/12/2015  ? GERD (gastroesophageal reflux disease) 05/12/2015  ? Ovarian carcinosarcoma, right ([place]) 03/29/2015  ? Endometrial ca [facility]) 03/29/2015  ? Ventral hernia without obstruction or gangrene   ? Morbid obesity ([place]) 02/19/2015  ? Essential hypertension, benign   ? Stress at home 12/14/2014  ? Left knee pain 08/04/2013  ? Seasonal allergies 07/29/2013  ? ? ?REFERRING DIAG:  M54.6 (ICD-10-CM) - Acute right-sided thoracic back pain  ? ?THERAPY DIAG:  ?Chronic bilateral low back pain without sciatica ? ?Pain in thoracic spine ? ?Muscle weakness (generalized) ? ?PERTINENT HISTORY:  ?Currently in remission from endometrial and ovarian cancer  ? ?PRECAUTIONS: None ? ?SUBJECTIVE:   ?Pt presents to PT with reports of continued lower back pain and discomfort. Believes this is due to morning stiffness. She is ready to begin PT at this time.  ? ?Pain: ?Are you having pain? Yes ?NPRS scale: 6/10 ?Pain location: lower back and bilateral LE ?PAIN TYPE: aching ?Pain description: intermittent  ?Aggravating factors: pulling, lifting, reaching, prolonged standing ?Relieving factors: rest  ? ? ?OBJECTIVE:  ? *Unless otherwise noted all objective measures were captured on initial evaluation.  ? ?DIAGNOSTIC FINDINGS:  ?N/A ?  ?PATIENT SURVEYS:  ?FOTO 36% function to 56%  predicted  ?05/30/21: 52% function  ?07/24/2021: 44% function ?  ?LUMBARAROM/PROM ?  ?A/PROM A/PROM  ?04/29/2021  ?Flexion Full; increased pain low back  ?Extension Full; decreases pain   ?Right lateral flexion 25% limited   ?Left lateral flexion 50% limited, pulling Rt side low back  ?Right rotation 25% limited   ?Left rotation 50% limited pulling Rt side low back   ? (Blank rows = not tested) ?  ?  ?LE MMT: ?  ?MMT Right ?04/29/2021 Left ?04/29/2021 Right ?07/24/2021 Left ?07/24/2021  ?Hip flexion 4; pn low back  4+    ?Hip extension        ?Hip abduction 3+; pn low back  3+    ? (Blank rows = not tested) ?  ?FUNCTIONAL TESTS:  ?5 times sit to stand: 13 seconds - 07/24/2021 ?   ?  ?TODAY'S TREATMENT  ?[facility] Adult PT Treatment:  DATE: 08/19/2021 ?Therapeutic Exercise: ?NuStep lvl 6 UE/LE x 5 min while taking subjective ?Lateral walk GTB x 3 laps at counter ?Standing hip abd/ext 2x10 GTB ?Paloff press 2x10 7# ?Row 2x15 20# ?Standing chop x 10 7# ?Seated physioball rollout x 15 - 5" hold ?STS 2x10 - elevated table 10# KB ?LTR x 10 each ?Supine PPT with march 2x20 ?Hooklying hundreds 2x30 ?Supine SLR 2x15 (small range with pulse) ?Abdominal curl up 3x15 ?LAQ 2x10 7# ? ?[facility] Adult PT Treatment:                                                DATE: 08/16/2021 ?Aquatic therapy at [address] - therapeutic pool temp 90 degrees. Pt enters building ambulating independently with no AD  Treatment took place in water 3.8 to  4 ft 8 in.feet deep depending upon activity.  Pt entered and exited the pool via stair and handrails independently.  ?Pt pain level 5/10 at initiation of water walking. ? ?Therapeutic Exercise: ?Walking forward, backward and side stepping 4 widths ea ?Runners stretch on bottom step x30" BIL ?Hamstring stretch on bottom step x30" BIL ?Figure 4 squat stretch using bil UE support 2x30" BIL ?Standing thoracic rotation with hands in neutral to create  resistance ?Forward lunge walking x2 laps ?Side stepping lunge walking x2 laps ?At edge of pool, pt performed LE exercise: ?Hip abd/add R/L 20 x each ?Hip ext/flex with knee straight x 20 ?Hip Circles bil CC/CCW x10

## 2021-08-22 NOTE — Therapy (Signed)
OUTPATIENT PHYSICAL THERAPY TREATMENT NOTE   Patient Name: [name] MRN: [id number] DOB:10/07/1965, 56 y.o., female Today's Date: 08/23/2021  PCP: Leeanne Rio, MD REFERRING PROVIDER: Leeanne Rio, MD   PT End of Session - 08/23/21 1513     Visit Number 21    Number of Visits 30    Date for PT Re-Evaluation 09/18/21    Authorization Type [employer] primary; MCD secondary    Authorization Time Period Approved 12 visits 07/24/2021-09/06/2021    Authorization - Visit Number 5    Authorization - Number of Visits 12    Progress Note Due on Visit 26    PT Start Time 0315    PT Stop Time 0400    PT Time Calculation (min) 45 min    Activity Tolerance Patient tolerated treatment well    Behavior During Therapy [facility] for tasks assessed/performed                           Past Medical History:  Diagnosis Date   Allergy    Anemia    Anxiety    Arthritis    Blood transfusion without reported diagnosis    Cough    Cough 06/11/2018   Difficulty sleeping 06/11/2018   Diverticulitis with perforation 2010   DVT (deep vein thrombosis) in pregnancy 11/24/2016   DVT, lower extremity ([place])    Endometrial cancer ([place])    Enlarged lymph nodes 08/13/2017   Family history of adverse reaction to anesthesia    pts daughter had difficulty awakening following anesthesia, long time to wake up   GERD (gastroesophageal reflux disease)    Headache    Hemoptysis 04/22/2018   History of bronchitis    History of ear infections    Hospital discharge follow-up 03/02/2019   Hx of migraines    Hyperlipidemia    Hypertension    IBS (irritable bowel syndrome)    Kidney infection    Lupus (systemic lupus erythematosus) ([place]) 02/2017   Morbid obesity ([place])    Neck pain 01/13/2019   Ovarian cancer ([place]) dx'd 01/2015   PONV (postoperative nausea and vomiting)    Port-A-Cath in place    Right side   Portacath in place 09/12/2015   Pre-diabetes    Pyelonephritis    Right knee  pain 11/28/2011   Scleroderma ([place])    Screen for colon cancer 03/02/2019   Slow rate of speech 08/22/2015   Chronic from CVA.  She also has loss of left nasolabial fold and slight left facial droop/small asymmetry on the left side secondary to CVA   UTI (urinary tract infection) 05/07/2018   Past Surgical History:  Procedure Laterality Date   ABDOMINAL HYSTERECTOMY N/A 03/13/2015   Procedure: TOTAL HYSTERECTOMY ABDOMINAL BILATERAL SALPINGO OOPHORECTOMY RADICAL TUMOR [place];  Surgeon: Everitt Amber, MD;  [facility];  Service: Gynecology;  Laterality: N/A;   BOWEL RESECTION  03/13/2015   Procedure: SMALL BOWEL RESECTION;  Surgeon: Everitt Amber, MD;  [facility];  Service: Gynecology;;   [place] and 1984   COLONOSCOPY WITH PROPOFOL N/A 07/19/2020   Procedure: COLONOSCOPY WITH PROPOFOL;  Surgeon: Doran Stabler, MD;  [facility];  Service: Gastroenterology;  Laterality: N/A;   COLOSTOMY  2008   COLOSTOMY TAKEDOWN     ESOPHAGOGASTRODUODENOSCOPY (EGD) WITH PROPOFOL N/A 12/21/2018   Procedure: ESOPHAGOGASTRODUODENOSCOPY (EGD) WITH PROPOFOL;  Surgeon: Doran Stabler, MD;  [facility];  Service: Gastroenterology;  Laterality: N/A;   HEMOSTASIS CLIP PLACEMENT  07/19/2020   Procedure: HEMOSTASIS CLIP PLACEMENT;  Surgeon: Doran Stabler, MD;  Location: [facility];  Service: Gastroenterology;;   LAPAROTOMY N/A 03/13/2015   Procedure: EXPLORATORY LAPAROTOMY;  Surgeon: Everitt Amber, MD;  [facility];  Service: Gynecology;  Laterality: N/A;   POLYPECTOMY  07/19/2020   Procedure: POLYPECTOMY;  Surgeon: Doran Stabler, MD;  Location: [facility];  Service: Gastroenterology;;   TONSILLECTOMY AND [place]  03/13/2015   Procedure: HERNIA REPAIR VENTRAL ADULT;  Surgeon: Everitt Amber, MD;  [facility];  Service: Gynecology;;   Patient Active Problem List   Diagnosis Date Noted   Viral illness 06/05/2021   Chronic  diastolic CHF (congestive heart failure) ([place]) 07/19/2020   Bronchospasm 07/19/2020   Incontinence 03/17/2020   Herpes simplex virus (HSV) infection of buttock 01/05/2020   OSA (obstructive sleep apnea) 07/25/2019   Prediabetes 07/25/2019   Lateral epicondylitis of left elbow 01/13/2019   Normocytic anemia 05/29/2018   Scleroderma ([place]) 04/22/2018   Swelling of foot joint, right 04/08/2018   Positive ANA (antinuclear antibody) 09/23/2016   Eczema 01/27/2016   History of pulmonary embolism 09/12/2015   Long term current use of anticoagulant therapy 09/06/2015   Chemotherapy-induced peripheral neuropathy ([place]) 06/13/2015   CKD (chronic kidney disease) stage 3, GFR 30-59 ml/min (HCC) 05/12/2015   GERD (gastroesophageal reflux disease) 05/12/2015   Ovarian carcinosarcoma, right ([place]) 03/29/2015   Endometrial ca ([place]) 03/29/2015   Ventral hernia without obstruction or gangrene    Morbid obesity ([place]) 02/19/2015   Essential hypertension, benign    Stress at home 12/14/2014   Left knee pain 08/04/2013   Seasonal allergies 07/29/2013    REFERRING DIAG:  M54.6 (ICD-10-CM) - Acute right-sided thoracic back pain   THERAPY DIAG:  Chronic bilateral low back pain without sciatica  Pain in thoracic spine  Muscle weakness (generalized)  PERTINENT HISTORY:  Currently in remission from endometrial and ovarian cancer   PRECAUTIONS: None  SUBJECTIVE:  Pt presents to PT with reports of continued lower back pain and discomfort. Believes this is due to morning stiffness.  Pain: Are you having pain? Yes NPRS scale: 5/10 Pain location: lower back and bilateral LE PAIN TYPE: aching Pain description: intermittent  Aggravating factors: pulling, lifting, reaching, prolonged standing Relieving factors: rest    OBJECTIVE:   *Unless otherwise noted all objective measures were captured on initial evaluation.   DIAGNOSTIC FINDINGS:  N/A   PATIENT SURVEYS:  FOTO 36% function to 56% predicted   05/30/21: 52% function  07/24/2021: 44% function   LUMBARAROM/PROM   A/PROM A/PROM  04/29/2021  Flexion Full; increased pain low back  Extension Full; decreases pain   Right lateral flexion 25% limited   Left lateral flexion 50% limited, pulling Rt side low back  Right rotation 25% limited   Left rotation 50% limited pulling Rt side low back    (Blank rows = not tested)     LE MMT:   MMT Right 04/29/2021 Left 04/29/2021 Right 07/24/2021 Left 07/24/2021  Hip flexion 4; pn low back  4+    Hip extension        Hip abduction 3+; pn low back  3+     (Blank rows = not tested)   FUNCTIONAL TESTS:  5 times sit to stand: 13 seconds - 07/24/2021      TODAY'S TREATMENT  [facility] Adult PT Treatment:  DATE: 08/23/2021 Aquatic therapy at [address] - therapeutic pool temp 92 degrees. Pt enters building ambulating independently with no AD  Treatment took place in water 3.8 to  4 ft 8 in.feet deep depending upon activity.  Pt entered and exited the pool via stair and handrails independently.  Pt pain level 5/10 at initiation of water walking.  Therapeutic Exercise: Walking forward, backward and side stepping 4 widths ea Runners stretch on bottom step x30" BIL Hamstring stretch on bottom step x30" BIL Figure 4 squat stretch using bil UE support 2x30" BIL Standing thoracic rotation with hands in neutral to create resistance Forward lunge walking x2 laps Side stepping lunge walking x2 laps STS from 3rd step from bottom 2x10 At edge of pool, pt performed LE exercise: Hip abd/add R/L 20 x each Hip ext/flex with knee straight x 20 Hip Circles bil CC/CCW x10 each BIL Marching hip flexion to knee extension 2x10 BIL Hamstring curl R/L x 20 Squats x 20 reps no UE support x 2 sets Heel raises x20 Seated on bench in water: Flutter kicking x1' Scissor kicking x1' Bicycle kicking x1' Reverse bicycle kicking x1' Kickboard push/pull  x1' Kickboard pushdowns x1'  Pt requires the buoyancy of water for active assisted exercises with buoyancy supported for strengthening and AROM exercises. Hydrostatic pressure also supports joints by unweighting joint load by at least 50 % in 3-4 feet depth water. 80% in chest to neck deep water. Water will provide assistance with movement using the current and laminar flow while the buoyancy reduces weight bearing. Pt requires the viscosity of the water for resistance with strengthening exercises.   [facility] Adult PT Treatment:                                                DATE: 08/19/2021 Therapeutic Exercise: NuStep lvl 6 UE/LE x 5 min while taking subjective Lateral walk GTB x 3 laps at counter Standing hip abd/ext 2x10 GTB Paloff press 2x10 7# Row 2x15 20# Standing chop x 10 7# Seated physioball rollout x 15 - 5" hold STS 2x10 - elevated table 10# KB LTR x 10 each Supine PPT with march 2x20 Hooklying hundreds 2x30 Supine SLR 2x15 (small range with pulse) Abdominal curl up 3x15 LAQ 2x10 7#  [facility] Adult PT Treatment:                                                DATE: 08/16/2021 Aquatic therapy at [address] - therapeutic pool temp 90 degrees. Pt enters building ambulating independently with no AD  Treatment took place in water 3.8 to  4 ft 8 in.feet deep depending upon activity.  Pt entered and exited the pool via stair and handrails independently.  Pt pain level 5/10 at initiation of water walking.  Therapeutic Exercise: Walking forward, backward and side stepping 4 widths ea Runners stretch on bottom step x30" BIL Hamstring stretch on bottom step x30" BIL Figure 4 squat stretch using bil UE support 2x30" BIL Standing thoracic rotation with hands in neutral to create resistance Forward lunge walking x2 laps Side stepping lunge walking x2 laps At edge of pool, pt performed LE exercise: Hip abd/add R/L 20 x each Hip ext/flex  with knee straight x 20 Hip Circles  bil CC/CCW x10 each BIL Marching hip flexion to knee extension 2x10 BIL Hamstring curl R/L x 20 Squats x 20 reps holding noodle x 2 sets Heel raises x20 Seated on bench in water: Flutter kicking x1' Scissor kicking x1' Bicycle kicking x1' Reverse bicycle kicking x1' Kickboard push/pull x1' Kickboard pushdowns x1'  Pt requires the buoyancy of water for active assisted exercises with buoyancy supported for strengthening and AROM exercises. Hydrostatic pressure also supports joints by unweighting joint load by at least 50 % in 3-4 feet depth water. 80% in chest to neck deep water. Water will provide assistance with movement using the current and laminar flow while the buoyancy reduces weight bearing. Pt requires the viscosity of the water for resistance with strengthening exercises.   PATIENT EDUCATION:  Education details: see treatment above  Person educated: Patient Education method: Explanation, Demonstration, Tactile cues, Verbal cues, and Handouts Education comprehension: verbalized understanding, returned demonstration, verbal cues required, tactile cues required, and needs further education     HOME EXERCISE PROGRAM: Aquatic program: Access Code: [email or web address]  Access Code: [email or web address] URL: [email or web address] Date: 05/30/2021 Prepared by: Gwendolyn Grant  Exercises Supine Lower Trunk Rotation - 2 x daily - 7 x weekly - 2 sets - 10 reps Supine Pelvic Tilt - 2 x daily - 7 x weekly - 2 sets - 10 reps Supine Figure 4 Piriformis Stretch - 2 x daily - 7 x weekly - 3 sets - 30 hold Standing Shoulder Row with Anchored Resistance - 1 x daily - 7 x weekly - 3 sets - 10 reps Hooklying Single Leg Bent Knee Fallouts with Resistance - 1 x daily - 7 x weekly - 3 sets - 20 reps - blue exercise band hold Sidelying Thoracic Rotation with Open Book - 1 x daily - 7 x weekly - 1 sets - 10 reps Shoulder External Rotation and Scapular Retraction with Resistance - 1 x daily - 7 x weekly - 2  sets - 10 reps    ASSESSMENT: Patient presents to PT with continued reports of LBP that is especially prevalent in the morning. Session today focused on LE and core strengthening and improving overall activity tolerance while in the aquatic environment for use of buoyancy and viscosity to offload joints and provide resistance. Patient was able to tolerate all prescribed exercises in the aquatic environment with no adverse effects and reports 0/10 pain at the end of the session. Patient continues to benefit from skilled PT services on land and aquatic based and should be progressed as able to improve functional independence.  Problem List: decreased balance, decreased endurance, decreased mobility, difficulty walking, decreased strength, and pain   GOALS: Goals reviewed with patient? No     LONG TERM GOALS:    LTG Name Target Date Goal status  1 Patient will demonstrate pain free lumbar AROM to improve ability to complete bending and lifting activity.  Baseline:see objective  09/18/2021 ONGOING  2 Patient will complete 5 x STS in < 12 seconds without increased back pain to signify improvements in functional strength.  Baseline: see objective 06/11/2021: 14 seconds 07/24/2021: 13 seconds 09/18/2021 ONGOING  3 Patient will tolerate at least 20 minutes of standing activity to improve tolerance to cleaning and grocery shopping Baseline: reports frequent rest breaks with cleaning at home and can only tolerate walking 1-2 aisles at grocery store currently.  09/18/2021 ONGOING  4 Patient will be independent with advanced home program to assist in management  of her chronic condition.  Baseline: initial HEP issued  09/18/2021 ONGOING  5 Patient will score 56% function on FOTO to signify clinically meaningful improvement in functional abilities.  Update: 52% function - 05/30/2021 07/24/2021: 44% function 09/18/2021 ONGOING    PLAN: PT FREQUENCY: 2x/week   PT DURATION: 8 weeks   PLANNED INTERVENTIONS:  Therapeutic exercises, Therapeutic activity, Neuro Muscular re-education, Balance training, Gait training, Patient/Family education, Stair training, Aquatic Therapy, Dry Needling, Cryotherapy, Moist heat, and Manual therapy   PLAN FOR NEXT SESSION:  manual to L-spine, spinal mobility, hip/core strength  Evelene Croon PTA 08/23/21 3:15 PM

## 2021-08-23 ENCOUNTER — Ambulatory Visit: Payer: Medicare HMO

## 2021-08-23 DIAGNOSIS — M545 Low back pain, unspecified: Secondary | ICD-10-CM | POA: Diagnosis not present

## 2021-08-23 DIAGNOSIS — M546 Pain in thoracic spine: Secondary | ICD-10-CM

## 2021-08-23 DIAGNOSIS — G8929 Other chronic pain: Secondary | ICD-10-CM

## 2021-08-23 DIAGNOSIS — M6281 Muscle weakness (generalized): Secondary | ICD-10-CM

## 2021-08-26 ENCOUNTER — Ambulatory Visit: Payer: Medicare HMO

## 2021-08-26 DIAGNOSIS — G8929 Other chronic pain: Secondary | ICD-10-CM

## 2021-08-26 DIAGNOSIS — M546 Pain in thoracic spine: Secondary | ICD-10-CM

## 2021-08-26 DIAGNOSIS — M545 Low back pain, unspecified: Secondary | ICD-10-CM | POA: Diagnosis not present

## 2021-08-26 DIAGNOSIS — M6281 Muscle weakness (generalized): Secondary | ICD-10-CM

## 2021-08-26 NOTE — Therapy (Signed)
OUTPATIENT PHYSICAL THERAPY TREATMENT NOTE   Patient Name: [name] MRN: [id number] DOB:1965/04/24, 56 y.o., female Today's Date: 08/26/2021  PCP: Leeanne Rio, MD REFERRING PROVIDER: Rise Patience, DO   PT End of Session - 08/26/21 1216     Visit Number 22    Number of Visits 30    Date for PT Re-Evaluation 09/18/21    Authorization Type [employer] primary; M[employer] secondary    Authorization Time Period Approved 12 visits 07/24/2021-09/06/2021    Authorization - Visit Number 5    Authorization - Number of Visits 12    Progress Note Due on Visit 26    PT Start Time 1214    PT Stop Time 1255    PT Time Calculation (min) 41 min    Activity Tolerance Patient tolerated treatment well    Behavior During Therapy WFL for tasks assessed/performed                            Past Medical History:  Diagnosis Date   Allergy    Anemia    Anxiety    Arthritis    Blood transfusion without reported diagnosis    Cough    Cough 06/11/2018   Difficulty sleeping 06/11/2018   Diverticulitis with perforation 2010   DVT (deep vein thrombosis) in pregnancy 11/24/2016   DVT, lower extremity ([place])    Endometrial cancer ([place])    Enlarged lymph nodes 08/13/2017   Family history of adverse reaction to anesthesia    pts daughter had difficulty awakening following anesthesia, long time to wake up   GERD (gastroesophageal reflux disease)    Headache    Hemoptysis 04/22/2018   History of bronchitis    History of ear infections    Hospital discharge follow-up 03/02/2019   Hx of migraines    Hyperlipidemia    Hypertension    IBS (irritable bowel syndrome)    Kidney infection    Lupus (systemic lupus erythematosus) ([place]) 02/2017   Morbid obesity ([place])    Neck pain 01/13/2019   Ovarian cancer ([place]) dx'd 01/2015   PONV (postoperative nausea and vomiting)    Port-A-Cath in place    Right side   Portacath in place 09/12/2015   Pre-diabetes    Pyelonephritis    Right knee  pain 11/28/2011   Scleroderma ([place])    Screen for colon cancer 03/02/2019   Slow rate of speech 08/22/2015   Chronic from CVA.  She also has loss of left nasolabial fold and slight left facial droop/small asymmetry on the left side secondary to CVA   UTI (urinary tract infection) 05/07/2018   Past Surgical History:  Procedure Laterality Date   ABDOMINAL HYSTERECTOMY N/A 03/13/2015   Procedure: TOTAL HYSTERECTOMY ABDOMINAL BILATERAL SALPINGO OOPHORECTOMY RADICAL TUMOR [place];  Surgeon: Everitt Amber, MD;  Location: [facility];  Service: Gynecology;  Laterality: N/A;   BOWEL RESECTION  03/13/2015   Procedure: SMALL BOWEL RESECTION;  Surgeon: Everitt Amber, MD;  Location: [facility];  Service: Gynecology;;   [place] and 1984   COLONOSCOPY WITH PROPOFOL N/A 07/19/2020   Procedure: COLONOSCOPY WITH PROPOFOL;  Surgeon: Doran Stabler, MD;  Location: [facility];  Service: Gastroenterology;  Laterality: N/A;   COLOSTOMY  2008   COLOSTOMY TAKEDOWN     ESOPHAGOGASTRODUODENOSCOPY (EGD) WITH PROPOFOL N/A 12/21/2018   Procedure: ESOPHAGOGASTRODUODENOSCOPY (EGD) WITH PROPOFOL;  Surgeon: Doran Stabler, MD;  Location: [facility];  Service: Gastroenterology;  Laterality: N/A;   HEMOSTASIS CLIP PLACEMENT  07/19/2020   Procedure: HEMOSTASIS CLIP PLACEMENT;  Surgeon: Doran Stabler, MD;  Location: [facility];  Service: Gastroenterology;;   LAPAROTOMY N/A 03/13/2015   Procedure: EXPLORATORY LAPAROTOMY;  Surgeon: Everitt Amber, MD;  Location: [facility];  Service: Gynecology;  Laterality: N/A;   POLYPECTOMY  07/19/2020   Procedure: POLYPECTOMY;  Surgeon: Doran Stabler, MD;  Location: [facility];  Service: Gastroenterology;;   TONSILLECTOMY AND [place]  03/13/2015   Procedure: HERNIA REPAIR VENTRAL ADULT;  Surgeon: Everitt Amber, MD;  Location: [facility];  Service: Gynecology;;   Patient Active Problem List   Diagnosis Date Noted   Viral illness 06/05/2021   Chronic  diastolic CHF (congestive heart failure) ([place]) 07/19/2020   Bronchospasm 07/19/2020   Incontinence 03/17/2020   Herpes simplex virus (HSV) infection of buttock 01/05/2020   OSA (obstructive sleep apnea) 07/25/2019   Prediabetes 07/25/2019   Lateral epicondylitis of left elbow 01/13/2019   Normocytic anemia 05/29/2018   Scleroderma ([place]) 04/22/2018   Swelling of foot joint, right 04/08/2018   Positive ANA (antinuclear antibody) 09/23/2016   Eczema 01/27/2016   History of pulmonary embolism 09/12/2015   Long term current use of anticoagulant therapy 09/06/2015   Chemotherapy-induced peripheral neuropathy ([place]) 06/13/2015   CKD (chronic kidney disease) stage 3, GFR 30-59 ml/min ([facility]) 05/12/2015   GERD (gastroesophageal reflux disease) 05/12/2015   Ovarian carcinosarcoma, right ([place]) 03/29/2015   Endometrial ca ([place]) 03/29/2015   Ventral hernia without obstruction or gangrene    Morbid obesity ([place]) 02/19/2015   Essential hypertension, benign    Stress at home 12/14/2014   Left knee pain 08/04/2013   Seasonal allergies 07/29/2013    REFERRING DIAG:  M54.6 (ICD-10-CM) - Acute right-sided thoracic back pain   THERAPY DIAG:  Chronic bilateral low back pain without sciatica  Pain in thoracic spine  Muscle weakness (generalized)  PERTINENT HISTORY:  Currently in remission from endometrial and ovarian cancer   PRECAUTIONS: None  SUBJECTIVE:  Pt presents with continued mid and lower back pain. She did some gardening over the weekend and notes some increased soreness. She is ready to begin PT at this time.  Pain: Are you having pain? Yes NPRS scale: 5/10 Pain location: lower back and bilateral LE PAIN TYPE: aching Pain description: intermittent  Aggravating factors: pulling, lifting, reaching, prolonged standing Relieving factors: rest    OBJECTIVE:   *Unless otherwise noted all objective measures were captured on initial evaluation.   DIAGNOSTIC FINDINGS:  N/A    PATIENT SURVEYS:  FOTO 36% function to 56% predicted  05/30/21: 52% function  07/24/2021: 44% function   LUMBARAROM/PROM   A/PROM A/PROM  04/29/2021  Flexion Full; increased pain low back  Extension Full; decreases pain   Right lateral flexion 25% limited   Left lateral flexion 50% limited, pulling Rt side low back  Right rotation 25% limited   Left rotation 50% limited pulling Rt side low back    (Blank rows = not tested)     LE MMT:   MMT Right 04/29/2021 Left 04/29/2021 Right 07/24/2021 Left 07/24/2021  Hip flexion 4; pn low back  4+    Hip extension        Hip abduction 3+; pn low back  3+     (Blank rows = not tested)   FUNCTIONAL TESTS:  5 times sit to stand: 13 seconds - 07/24/2021      TODAY'S TREATMENT  [facility] Adult PT Treatment:                                                DATE: 08/26/2021 Therapeutic Exercise: NuStep lvl 6 UE/LE x 5 min while taking subjective Lateral walk GTB x 4 laps at counter Standing hip abd/ext 2x15 GTB Paloff press 2x10 10# Row 3x10 20# Shoulder extension 2x10 20# Standing chop x 10 10# Seated physioball rollout x 15 - 5" hold STS 2x10 - elevated table 10# KB LTR x 10 each Hooklying hundreds 2x30 Supine SLR x 15 (small range with pulse) - L side increased pain Abdominal curl up 3x15 Knee ext 2x10 10# Knee flex 2x10 25#  [facility] Adult PT Treatment:                                                DATE: 08/23/2021 Aquatic therapy at [address] - therapeutic pool temp 92 degrees. Pt enters building ambulating independently with no AD  Treatment took place in water 3.8 to  4 ft 8 in.feet deep depending upon activity.  Pt entered and exited the pool via stair and handrails independently.  Pt pain level 5/10 at initiation of water walking.  Therapeutic Exercise: Walking forward, backward and side stepping 4 widths ea Runners stretch on bottom step x30" BIL Hamstring stretch on bottom step x30" BIL Figure 4 squat stretch  using bil UE support 2x30" BIL Standing thoracic rotation with hands in neutral to create resistance Forward lunge walking x2 laps Side stepping lunge walking x2 laps STS from 3rd step from bottom 2x10 At edge of pool, pt performed LE exercise: Hip abd/add R/L 20 x each Hip ext/flex with knee straight x 20 Hip Circles bil CC/CCW x10 each BIL Marching hip flexion to knee extension 2x10 BIL Hamstring curl R/L x 20 Squats x 20 reps no UE support x 2 sets Heel raises x20 Seated on bench in water: Flutter kicking x1' Scissor kicking x1' Bicycle kicking x1' Reverse bicycle kicking x1' Kickboard push/pull x1' Kickboard pushdowns x1'  Pt requires the buoyancy of water for active assisted exercises with buoyancy supported for strengthening and AROM exercises. Hydrostatic pressure also supports joints by unweighting joint load by at least 50 % in 3-4 feet depth water. 80% in chest to neck deep water. Water will provide assistance with movement using the current and laminar flow while the buoyancy reduces weight bearing. Pt requires the viscosity of the water for resistance with strengthening exercises.   [facility] Adult PT Treatment:                                                DATE: 08/19/2021 Therapeutic Exercise: NuStep lvl 6 UE/LE x 5 min while taking subjective Lateral walk GTB x 3 laps at counter Standing hip abd/ext 2x10 GTB Paloff press 2x10 7# Row 2x15 20# Standing chop x 10 7# Seated physioball rollout x 15 - 5" hold STS 2x10 - elevated table 10# KB LTR x 10 each Supine PPT with march 2x20 Hooklying hundreds 2x30 Supine SLR 2x15 (small range with pulse) Abdominal  curl up 3x15 LAQ 2x10 7#   PATIENT EDUCATION:  Education details: see treatment above  Person educated: Patient Education method: Explanation, Demonstration, Tactile cues, Verbal cues, and Handouts Education comprehension: verbalized understanding, returned demonstration, verbal cues required, tactile cues required,  and needs further education     HOME EXERCISE PROGRAM: Aquatic program: Access Code: [email or web address]  Access Code: [email or web address] URL: [email or web address] Date: 05/30/2021 Prepared by: Gwendolyn Grant  Exercises Supine Lower Trunk Rotation - 2 x daily - 7 x weekly - 2 sets - 10 reps Supine Pelvic Tilt - 2 x daily - 7 x weekly - 2 sets - 10 reps Supine Figure 4 Piriformis Stretch - 2 x daily - 7 x weekly - 3 sets - 30 hold Standing Shoulder Row with Anchored Resistance - 1 x daily - 7 x weekly - 3 sets - 10 reps Hooklying Single Leg Bent Knee Fallouts with Resistance - 1 x daily - 7 x weekly - 3 sets - 20 reps - blue exercise band hold Sidelying Thoracic Rotation with Open Book - 1 x daily - 7 x weekly - 1 sets - 10 reps Shoulder External Rotation and Scapular Retraction with Resistance - 1 x daily - 7 x weekly - 2 sets - 10 reps    ASSESSMENT: Pt was again able to complete all prescribed exercises with no adverse effect or increase in pain. Therapy focused again on progressing core and LE strengthening, with pt continuing to progress well with therapy with increased difficulty of exercises. She continues to benefit from skilled PT working on improving strength and activity tolerance in order to decrease pain and improve function. PT will continue to progress as able per POC.   Problem List: decreased balance, decreased endurance, decreased mobility, difficulty walking, decreased strength, and pain   GOALS: Goals reviewed with patient? No     LONG TERM GOALS:    LTG Name Target Date Goal status  1 Patient will demonstrate pain free lumbar AROM to improve ability to complete bending and lifting activity.  Baseline:see objective  09/18/2021 ONGOING  2 Patient will complete 5 x STS in < 12 seconds without increased back pain to signify improvements in functional strength.  Baseline: see objective 06/11/2021: 14 seconds 07/24/2021: 13 seconds 09/18/2021 ONGOING  3 Patient will tolerate  at least 20 minutes of standing activity to improve tolerance to cleaning and grocery shopping Baseline: reports frequent rest breaks with cleaning at home and can only tolerate walking 1-2 aisles at grocery store currently.  09/18/2021 ONGOING  4 Patient will be independent with advanced home program to assist in management of her chronic condition.  Baseline: initial HEP issued  09/18/2021 ONGOING  5 Patient will score 56% function on FOTO to signify clinically meaningful improvement in functional abilities.  Update: 52% function - 05/30/2021 07/24/2021: 44% function 09/18/2021 ONGOING    PLAN: PT FREQUENCY: 2x/week   PT DURATION: 8 weeks   PLANNED INTERVENTIONS: Therapeutic exercises, Therapeutic activity, Neuro Muscular re-education, Balance training, Gait training, Patient/Family education, Stair training, Aquatic Therapy, Dry Needling, Cryotherapy, Moist heat, and Manual therapy   PLAN FOR NEXT SESSION:  manual to L-spine, spinal mobility, hip/core strength  Ward Chatters PT 08/26/21 1:46 PM

## 2021-08-28 NOTE — Therapy (Signed)
OUTPATIENT PHYSICAL THERAPY TREATMENT NOTE   Patient Name: [name] MRN: [id number] DOB:[birth year], 56 y.o., female Today's Date: 08/29/2021  PCP: Leeanne Rio, MD REFERRING PROVIDER: Leeanne Rio, MD   PT End of Session - 08/29/21 1045     Visit Number 23    Number of Visits 30    Date for PT Re-Evaluation 09/18/21    Authorization Type [employer] primary; MCD secondary    Authorization Time Period Approved 12 visits 07/24/2021-09/06/2021    Authorization - Visit Number 5    Authorization - Number of Visits 12    Progress Note Due on Visit 26    PT Start Time 1045    PT Stop Time 8144    PT Time Calculation (min) 38 min    Activity Tolerance Patient tolerated treatment well    Behavior During Therapy WFL for tasks assessed/performed                             Past Medical History:  Diagnosis Date   Allergy    Anemia    Anxiety    Arthritis    Blood transfusion without reported diagnosis    Cough    Cough 06/11/2018   Difficulty sleeping 06/11/2018   Diverticulitis with perforation 2010   DVT (deep vein thrombosis) in pregnancy 11/24/2016   DVT, lower extremity ([place])    Endometrial cancer ([place])    Enlarged lymph nodes 08/13/2017   Family history of adverse reaction to anesthesia    pts daughter had difficulty awakening following anesthesia, long time to wake up   GERD (gastroesophageal reflux disease)    Headache    Hemoptysis 04/22/2018   History of bronchitis    History of ear infections    Hospital discharge follow-up 03/02/2019   Hx of migraines    Hyperlipidemia    Hypertension    IBS (irritable bowel syndrome)    Kidney infection    Lupus (systemic lupus erythematosus) ([place]) 02/2017   Morbid obesity ([place])    Neck pain 01/13/2019   Ovarian cancer ([place]) dx'd 01/2015   PONV (postoperative nausea and vomiting)    Port-A-Cath in place    Right side   Portacath in place 09/12/2015   Pre-diabetes    Pyelonephritis    Right  knee pain 11/28/2011   Scleroderma ([place])    Screen for colon cancer 03/02/2019   Slow rate of speech 08/22/2015   Chronic from CVA.  She also has loss of left nasolabial fold and slight left facial droop/small asymmetry on the left side secondary to CVA   UTI (urinary tract infection) 05/07/2018   Past Surgical History:  Procedure Laterality Date   ABDOMINAL HYSTERECTOMY N/A 03/13/2015   Procedure: TOTAL HYSTERECTOMY ABDOMINAL BILATERAL SALPINGO OOPHORECTOMY RADICAL TUMOR [place];  Surgeon: Everitt Amber, MD;  Location: [facility];  Service: Gynecology;  Laterality: N/A;   BOWEL RESECTION  03/13/2015   Procedure: SMALL BOWEL RESECTION;  Surgeon: Everitt Amber, MD;  Location: [facility];  Service: Gynecology;;   [place] and 1984   COLONOSCOPY WITH PROPOFOL N/A 07/19/2020   Procedure: COLONOSCOPY WITH PROPOFOL;  Surgeon: Doran Stabler, MD;  [facility];  Service: Gastroenterology;  Laterality: N/A;   COLOSTOMY  2008   COLOSTOMY TAKEDOWN     ESOPHAGOGASTRODUODENOSCOPY (EGD) WITH PROPOFOL N/A 12/21/2018   Procedure: ESOPHAGOGASTRODUODENOSCOPY (EGD) WITH PROPOFOL;  Surgeon: Doran Stabler, MD;  [facility];  Service: Gastroenterology;  Laterality: N/A;   HEMOSTASIS CLIP PLACEMENT  07/19/2020   Procedure: HEMOSTASIS CLIP PLACEMENT;  Surgeon: Doran Stabler, MD;  Location: [facility];  Service: Gastroenterology;;   LAPAROTOMY N/A 03/13/2015   Procedure: EXPLORATORY LAPAROTOMY;  Surgeon: Everitt Amber, MD;  [facility];  Service: Gynecology;  Laterality: N/A;   POLYPECTOMY  07/19/2020   Procedure: POLYPECTOMY;  Surgeon: Doran Stabler, MD;  Location: [facility];  Service: Gastroenterology;;   TONSILLECTOMY AND [place]  03/13/2015   Procedure: HERNIA REPAIR VENTRAL ADULT;  Surgeon: Everitt Amber, MD;  [facility];  Service: Gynecology;;   Patient Active Problem List   Diagnosis Date Noted   Viral illness 06/05/2021    Chronic diastolic CHF (congestive heart failure) ([place]) 07/19/2020   Bronchospasm 07/19/2020   Incontinence 03/17/2020   Herpes simplex virus (HSV) infection of buttock 01/05/2020   OSA (obstructive sleep apnea) 07/25/2019   Prediabetes 07/25/2019   Lateral epicondylitis of left elbow 01/13/2019   Normocytic anemia 05/29/2018   Scleroderma ([place]) 04/22/2018   Swelling of foot joint, right 04/08/2018   Positive ANA (antinuclear antibody) 09/23/2016   Eczema 01/27/2016   History of pulmonary embolism 09/12/2015   Long term current use of anticoagulant therapy 09/06/2015   Chemotherapy-induced peripheral neuropathy ([place]) 06/13/2015   CKD (chronic kidney disease) stage 3, GFR 30-59 ml/min (HCC) 05/12/2015   GERD (gastroesophageal reflux disease) 05/12/2015   Ovarian carcinosarcoma, right ([place]) 03/29/2015   Endometrial ca ([place]) 03/29/2015   Ventral hernia without obstruction or gangrene    Morbid obesity ([place]) 02/19/2015   Essential hypertension, benign    Stress at home 12/14/2014   Left knee pain 08/04/2013   Seasonal allergies 07/29/2013    REFERRING DIAG:  M54.6 (ICD-10-CM) - Acute right-sided thoracic back pain   THERAPY DIAG:  Chronic bilateral low back pain without sciatica  Pain in thoracic spine  Muscle weakness (generalized)  PERTINENT HISTORY:  Currently in remission from endometrial and ovarian cancer   PRECAUTIONS: None  SUBJECTIVE:  Pt presents to PT with reports of onset of L knee pain after last session. She also continues to report low and mid back discomfort. Pt is ready to begin PT at this time.  Pain: Are you having pain? Yes NPRS scale: 5/10 lower back; 7/10 L knee Pain location: lower back and L knee PAIN TYPE: aching Pain description: intermittent  Aggravating factors: pulling, lifting, reaching, prolonged standing Relieving factors: rest    OBJECTIVE:   *Unless otherwise noted all objective measures were captured on initial evaluation.    DIAGNOSTIC FINDINGS:  N/A   PATIENT SURVEYS:  FOTO 36% function to 56% predicted  05/30/21: 52% function  07/24/2021: 44% function   LUMBARAROM/PROM   A/PROM A/PROM  04/29/2021  Flexion Full; increased pain low back  Extension Full; decreases pain   Right lateral flexion 25% limited   Left lateral flexion 50% limited, pulling Rt side low back  Right rotation 25% limited   Left rotation 50% limited pulling Rt side low back    (Blank rows = not tested)     LE MMT:   MMT Right 04/29/2021 Left 04/29/2021 Right 07/24/2021 Left 07/24/2021  Hip flexion 4; pn low back  4+    Hip extension        Hip abduction 3+; pn low back  3+     (Blank rows = not tested)   FUNCTIONAL TESTS:  5 times sit to stand:  13 seconds - 07/24/2021      TODAY'S TREATMENT  [facility] Adult PT Treatment:                                                DATE: 08/29/2021 Therapeutic Exercise: NuStep lvl 4 UE/LE x 4 min while taking subjective Row 3x10 20# Shoulder extension 2x10 20# Standing chop x 10 10# LAQ 3x10 6# each Supine clamshell 3x20 BlackTB Supine march 2x20 Black TB Supine horizontal abd 3x10 Black TB Seated physioball rollout x 15 - 5" hold LTR x 10 each Hooklying hundreds 3x30 Abdominal curl up 3x15 Knee ext 2x10 10# Knee flex 2x10 25#  [facility] Adult PT Treatment:                                                DATE: 08/26/2021 Therapeutic Exercise: NuStep lvl 6 UE/LE x 5 min while taking subjective Lateral walk GTB x 4 laps at counter Standing hip abd/ext 2x15 GTB Paloff press 2x10 10# Row 3x10 20# Shoulder extension 2x10 20# Standing chop x 10 10# Seated physioball rollout x 15 - 5" hold STS 2x10 - elevated table 10# KB LTR x 10 each Hooklying hundreds 2x30 Supine SLR x 15 (small range with pulse) - L side increased pain Abdominal curl up 3x15 Knee ext 2x10 10# Knee flex 2x10 25#  [facility] Adult PT Treatment:                                                DATE: 08/23/2021 Aquatic  therapy at [address] - therapeutic pool temp 92 degrees. Pt enters building ambulating independently with no AD  Treatment took place in water 3.8 to  4 ft 8 in.feet deep depending upon activity.  Pt entered and exited the pool via stair and handrails independently.  Pt pain level 5/10 at initiation of water walking.  Therapeutic Exercise: Walking forward, backward and side stepping 4 widths ea Runners stretch on bottom step x30" BIL Hamstring stretch on bottom step x30" BIL Figure 4 squat stretch using bil UE support 2x30" BIL Standing thoracic rotation with hands in neutral to create resistance Forward lunge walking x2 laps Side stepping lunge walking x2 laps STS from 3rd step from bottom 2x10 At edge of pool, pt performed LE exercise: Hip abd/add R/L 20 x each Hip ext/flex with knee straight x 20 Hip Circles bil CC/CCW x10 each BIL Marching hip flexion to knee extension 2x10 BIL Hamstring curl R/L x 20 Squats x 20 reps no UE support x 2 sets Heel raises x20 Seated on bench in water: Flutter kicking x1' Scissor kicking x1' Bicycle kicking x1' Reverse bicycle kicking x1' Kickboard push/pull x1' Kickboard pushdowns x1'  Pt requires the buoyancy of water for active assisted exercises with buoyancy supported for strengthening and AROM exercises. Hydrostatic pressure also supports joints by unweighting joint load by at least 50 % in 3-4 feet depth water. 80% in chest to neck deep water. Water will provide assistance with movement using the current and laminar flow while the buoyancy reduces weight bearing. Pt requires the  viscosity of the water for resistance with strengthening exercises.  PATIENT EDUCATION:  Education details: see treatment above  Person educated: Patient Education method: Explanation, Demonstration, Tactile cues, Verbal cues, and Handouts Education comprehension: verbalized understanding, returned demonstration, verbal cues required, tactile cues  required, and needs further education     HOME EXERCISE PROGRAM: Aquatic program: Access Code: [email or web address]  Access Code: [email or web address] URL: [email or web address] Date: 05/30/2021 Prepared by: Gwendolyn Grant  Exercises Supine Lower Trunk Rotation - 2 x daily - 7 x weekly - 2 sets - 10 reps Supine Pelvic Tilt - 2 x daily - 7 x weekly - 2 sets - 10 reps Supine Figure 4 Piriformis Stretch - 2 x daily - 7 x weekly - 3 sets - 30 hold Standing Shoulder Row with Anchored Resistance - 1 x daily - 7 x weekly - 3 sets - 10 reps Hooklying Single Leg Bent Knee Fallouts with Resistance - 1 x daily - 7 x weekly - 3 sets - 20 reps - blue exercise band hold Sidelying Thoracic Rotation with Open Book - 1 x daily - 7 x weekly - 1 sets - 10 reps Shoulder External Rotation and Scapular Retraction with Resistance - 1 x daily - 7 x weekly - 2 sets - 10 reps    ASSESSMENT: Pt was able to complete all prescribed exercises with no adverse effect or increase in pain. Therapy today focused on improving core and proximal hip/periscapular strength in standing and supine in order to decrease pain. Avoided loaded knee strengthening due to sharp increase in L knee pain prior to session. Pt continues to progress with therapy and will continue to be seen and progressed as able.   Problem List: decreased balance, decreased endurance, decreased mobility, difficulty walking, decreased strength, and pain   GOALS: Goals reviewed with patient? No     LONG TERM GOALS:    LTG Name Target Date Goal status  1 Patient will demonstrate pain free lumbar AROM to improve ability to complete bending and lifting activity.  Baseline:see objective  09/18/2021 ONGOING  2 Patient will complete 5 x STS in < 12 seconds without increased back pain to signify improvements in functional strength.  Baseline: see objective 06/11/2021: 14 seconds 07/24/2021: 13 seconds 09/18/2021 ONGOING  3 Patient will tolerate at least 20 minutes of  standing activity to improve tolerance to cleaning and grocery shopping Baseline: reports frequent rest breaks with cleaning at home and can only tolerate walking 1-2 aisles at grocery store currently.  09/18/2021 ONGOING  4 Patient will be independent with advanced home program to assist in management of her chronic condition.  Baseline: initial HEP issued  09/18/2021 ONGOING  5 Patient will score 56% function on FOTO to signify clinically meaningful improvement in functional abilities.  Update: 52% function - 05/30/2021 07/24/2021: 44% function 09/18/2021 ONGOING    PLAN: PT FREQUENCY: 2x/week   PT DURATION: 8 weeks   PLANNED INTERVENTIONS: Therapeutic exercises, Therapeutic activity, Neuro Muscular re-education, Balance training, Gait training, Patient/Family education, Stair training, Aquatic Therapy, Dry Needling, Cryotherapy, Moist heat, and Manual therapy   PLAN FOR NEXT SESSION:  manual to L-spine, spinal mobility, hip/core strength  Ward Chatters PT 08/29/21 11:24 AM

## 2021-08-29 ENCOUNTER — Ambulatory Visit: Payer: Medicare HMO

## 2021-08-29 DIAGNOSIS — M545 Low back pain, unspecified: Secondary | ICD-10-CM

## 2021-08-29 DIAGNOSIS — M6281 Muscle weakness (generalized): Secondary | ICD-10-CM

## 2021-08-29 DIAGNOSIS — M546 Pain in thoracic spine: Secondary | ICD-10-CM

## 2021-09-03 ENCOUNTER — Ambulatory Visit: Payer: Medicare HMO

## 2021-09-03 DIAGNOSIS — M545 Low back pain, unspecified: Secondary | ICD-10-CM

## 2021-09-03 DIAGNOSIS — M6281 Muscle weakness (generalized): Secondary | ICD-10-CM

## 2021-09-03 DIAGNOSIS — M546 Pain in thoracic spine: Secondary | ICD-10-CM

## 2021-09-03 NOTE — Therapy (Signed)
OUTPATIENT PHYSICAL THERAPY TREATMENT NOTE   Patient Name: [name] MRN: [id number] DOB:1965-05-02, 56 y.o., female Today's Date: 09/03/2021  PCP: Leeanne Rio, MD REFERRING PROVIDER: Rise Patience, DO   PT End of Session - 09/03/21 1050     Visit Number 24    Number of Visits 30    Date for PT Re-Evaluation 09/18/21    Authorization Type [employer] primary; MCD secondary    Authorization Time Period Approved 12 visits 07/24/2021-09/06/2021    Authorization - Visit Number 5    Authorization - Number of Visits 12    Progress Note Due on Visit 26    PT Start Time 6734    PT Stop Time 1125    PT Time Calculation (min) 38 min    Activity Tolerance Patient tolerated treatment well    Behavior During Therapy WFL for tasks assessed/performed                              Past Medical History:  Diagnosis Date   Allergy    Anemia    Anxiety    Arthritis    Blood transfusion without reported diagnosis    Cough    Cough 06/11/2018   Difficulty sleeping 06/11/2018   Diverticulitis with perforation 2010   DVT (deep vein thrombosis) in pregnancy 11/24/2016   DVT, lower extremity ([place])    Endometrial cancer ([place])    Enlarged lymph nodes 08/13/2017   Family history of adverse reaction to anesthesia    pts daughter had difficulty awakening following anesthesia, long time to wake up   GERD (gastroesophageal reflux disease)    Headache    Hemoptysis 04/22/2018   History of bronchitis    History of ear infections    Hospital discharge follow-up 03/02/2019   Hx of migraines    Hyperlipidemia    Hypertension    IBS (irritable bowel syndrome)    Kidney infection    Lupus (systemic lupus erythematosus) ([place]) 02/2017   Morbid obesity ([place])    Neck pain 01/13/2019   Ovarian cancer ([employer]) dx'd 01/2015   PONV (postoperative nausea and vomiting)    Port-A-Cath in place    Right side   Portacath in place 09/12/2015   Pre-diabetes    Pyelonephritis    Right knee  pain 11/28/2011   Scleroderma ([place])    Screen for colon cancer 03/02/2019   Slow rate of speech 08/22/2015   Chronic from CVA.  She also has loss of left nasolabial fold and slight left facial droop/small asymmetry on the left side secondary to CVA   UTI (urinary tract infection) 05/07/2018   Past Surgical History:  Procedure Laterality Date   ABDOMINAL HYSTERECTOMY N/A 03/13/2015   Procedure: TOTAL HYSTERECTOMY ABDOMINAL BILATERAL SALPINGO OOPHORECTOMY RADICAL TUMOR [place];  Surgeon: Everitt Amber, MD;  [facility];  Service: Gynecology;  Laterality: N/A;   BOWEL RESECTION  03/13/2015   Procedure: SMALL BOWEL RESECTION;  Surgeon: Everitt Amber, MD;  Location: [facility];  Service: Gynecology;;   [place] and 1984   COLONOSCOPY WITH PROPOFOL N/A 07/19/2020   Procedure: COLONOSCOPY WITH PROPOFOL;  Surgeon: Doran Stabler, MD;  [facility];  Service: Gastroenterology;  Laterality: N/A;   COLOSTOMY  2008   COLOSTOMY TAKEDOWN     ESOPHAGOGASTRODUODENOSCOPY (EGD) WITH PROPOFOL N/A 12/21/2018   Procedure: ESOPHAGOGASTRODUODENOSCOPY (EGD) WITH PROPOFOL;  Surgeon: Doran Stabler, MD;  [facility];  Service: Gastroenterology;  Laterality: N/A;   HEMOSTASIS CLIP PLACEMENT  07/19/2020   Procedure: HEMOSTASIS CLIP PLACEMENT;  Surgeon: Doran Stabler, MD;  Location: [facility];  Service: Gastroenterology;;   LAPAROTOMY N/A 03/13/2015   Procedure: EXPLORATORY LAPAROTOMY;  Surgeon: Everitt Amber, MD;  [facility];  Service: Gynecology;  Laterality: N/A;   POLYPECTOMY  07/19/2020   Procedure: POLYPECTOMY;  Surgeon: Doran Stabler, MD;  Location: [facility];  Service: Gastroenterology;;   TONSILLECTOMY AND [place]  03/13/2015   Procedure: HERNIA REPAIR VENTRAL ADULT;  Surgeon: Everitt Amber, MD;  [facility];  Service: Gynecology;;   Patient Active Problem List   Diagnosis Date Noted   Viral illness 06/05/2021   Chronic  diastolic CHF (congestive heart failure) ([place]) 07/19/2020   Bronchospasm 07/19/2020   Incontinence 03/17/2020   Herpes simplex virus (HSV) infection of buttock 01/05/2020   OSA (obstructive sleep apnea) 07/25/2019   Prediabetes 07/25/2019   Lateral epicondylitis of left elbow 01/13/2019   Normocytic anemia 05/29/2018   Scleroderma ([place]) 04/22/2018   Swelling of foot joint, right 04/08/2018   Positive ANA (antinuclear antibody) 09/23/2016   Eczema 01/27/2016   History of pulmonary embolism 09/12/2015   Long term current use of anticoagulant therapy 09/06/2015   Chemotherapy-induced peripheral neuropathy ([place]) 06/13/2015   CKD (chronic kidney disease) stage 3, GFR 30-59 ml/min (HCC) 05/12/2015   GERD (gastroesophageal reflux disease) 05/12/2015   Ovarian carcinosarcoma, right ([place]) 03/29/2015   Endometrial ca ([place]) 03/29/2015   Ventral hernia without obstruction or gangrene    Morbid obesity ([place]) 02/19/2015   Essential hypertension, benign    Stress at home 12/14/2014   Left knee pain 08/04/2013   Seasonal allergies 07/29/2013    REFERRING DIAG:  M54.6 (ICD-10-CM) - Acute right-sided thoracic back pain   THERAPY DIAG:  Chronic bilateral low back pain without sciatica  Pain in thoracic spine  Muscle weakness (generalized)  PERTINENT HISTORY:  Currently in remission from endometrial and ovarian cancer   PRECAUTIONS: None  SUBJECTIVE:  Pt presents to PT with reports of bilateral knee pain. She also notes some continued lower back pain and discomfort, although it is less than it has been. She is ready to begin PT at this time.   Pain: Are you having pain? Yes NPRS scale: 5/10 lower back; 7/10 L knee Pain location: lower back and L knee PAIN TYPE: aching Pain description: intermittent  Aggravating factors: pulling, lifting, reaching, prolonged standing Relieving factors: rest    OBJECTIVE:   *Unless otherwise noted all objective measures were captured on initial  evaluation.   DIAGNOSTIC FINDINGS:  N/A   PATIENT SURVEYS:  FOTO 36% function to 56% predicted  05/30/21: 52% function  07/24/2021: 44% function   LUMBARAROM/PROM   A/PROM A/PROM  04/29/2021  Flexion Full; increased pain low back  Extension Full; decreases pain   Right lateral flexion 25% limited   Left lateral flexion 50% limited, pulling Rt side low back  Right rotation 25% limited   Left rotation 50% limited pulling Rt side low back    (Blank rows = not tested)     LE MMT:   MMT Right 04/29/2021 Left 04/29/2021 Right 07/24/2021 Left 07/24/2021  Hip flexion 4; pn low back  4+    Hip extension        Hip abduction 3+; pn low back  3+     (Blank rows = not tested)   FUNCTIONAL TESTS:  5  times sit to stand: 13 seconds - 07/24/2021      TODAY'S TREATMENT  [facility] Adult PT Treatment:                                                DATE: 09/03/2021 Therapeutic Exercise: NuStep lvl 6 UE/LE x 4 min while taking subjective Row 3x10 20# Shoulder extension 2x10 20# Standing chop x 10 10# LAQ 3x10 6# each Supine clamshell 3x20 BlackTB Supine march 2x20 Black TB Supine horizontal abd 3x10 Black TB Seated physioball rollout x 15 - 5" hold LTR x 10 each Hooklying hundreds 3x30 Abdominal curl up 3x15  [facility] Adult PT Treatment:                                                DATE: 08/29/2021 Therapeutic Exercise: NuStep lvl 4 UE/LE x 4 min while taking subjective Row 3x10 20# Shoulder extension 2x10 20# Standing chop x 10 10# LAQ 3x10 6# each Supine clamshell 3x20 BlackTB Supine march 2x20 Black TB Supine horizontal abd 3x10 Black TB Seated physioball rollout x 15 - 5" hold LTR x 10 each Hooklying hundreds 3x30 Abdominal curl up 3x15 Knee ext 2x10 10# Knee flex 2x10 25#  [facility] Adult PT Treatment:                                                DATE: 08/26/2021 Therapeutic Exercise: NuStep lvl 6 UE/LE x 5 min while taking subjective Lateral walk GTB x 4 laps at  counter Standing hip abd/ext 2x15 GTB Paloff press 2x10 10# Row 3x10 20# Shoulder extension 2x10 20# Standing chop x 10 10# Seated physioball rollout x 15 - 5" hold STS 2x10 - elevated table 10# KB LTR x 10 each Hooklying hundreds 2x30 Supine SLR x 15 (small range with pulse) - L side increased pain Abdominal curl up 3x15 Knee ext 2x10 10# Knee flex 2x10 25#  PATIENT EDUCATION:  Education details: see treatment above  Person educated: Patient Education method: Explanation, Demonstration, Tactile cues, Verbal cues, and Handouts Education comprehension: verbalized understanding, returned demonstration, verbal cues required, tactile cues required, and needs further education     HOME EXERCISE PROGRAM: Aquatic program: Access Code: [email or web address]  Access Code: [email or web address] URL: [email or web address] Date: 05/30/2021 Prepared by: Gwendolyn Grant  Access Code: [email or web address] URL: [email or web address] Date: 09/03/2021 Prepared by: Octavio Manns  Exercises - Supine Lower Trunk Rotation  - 2 x daily - 7 x weekly - 2 sets - 10 reps - Supine Pelvic Tilt  - 2 x daily - 7 x weekly - 2 sets - 10 reps - Supine Figure 4 Piriformis Stretch  - 2 x daily - 7 x weekly - 3 sets - 30  hold - Standing Shoulder Row with Anchored Resistance  - 1 x daily - 7 x weekly - 3 sets - 10 reps - Hooklying Single Leg Bent Knee Fallouts with Resistance  - 1 x daily - 7 x weekly - 3 sets - 20 reps - blue exercise band hold - Sidelying Thoracic Rotation with Open Book  - 1 x daily -  7 x weekly - 1 sets - 10 reps - Shoulder External Rotation and Scapular Retraction with Resistance  - 1 x daily - 7 x weekly - 2 sets - 10 reps - The Hundred 1 Beginner  - 1 x daily - 7 x weekly - 3 sets - 20 reps    ASSESSMENT: Pt was able to complete all prescribed exercises with no adverse effect or increase in pain. Therapy today focused on improving core and proximal hip/periscapular strength in standing and  supine in order to decrease pain. Continued to avoid loaded knee strengthening due to sharp increase in L knee pain prior to session. PT will assess goals/function next session to determine next steps in POC.   Problem List: decreased balance, decreased endurance, decreased mobility, difficulty walking, decreased strength, and pain   GOALS: Goals reviewed with patient? No     LONG TERM GOALS:    LTG Name Target Date Goal status  1 Patient will demonstrate pain free lumbar AROM to improve ability to complete bending and lifting activity.  Baseline:see objective  09/18/2021 ONGOING  2 Patient will complete 5 x STS in < 12 seconds without increased back pain to signify improvements in functional strength.  Baseline: see objective 06/11/2021: 14 seconds 07/24/2021: 13 seconds 09/18/2021 ONGOING  3 Patient will tolerate at least 20 minutes of standing activity to improve tolerance to cleaning and grocery shopping Baseline: reports frequent rest breaks with cleaning at home and can only tolerate walking 1-2 aisles at grocery store currently.  09/18/2021 ONGOING  4 Patient will be independent with advanced home program to assist in management of her chronic condition.  Baseline: initial HEP issued  09/18/2021 ONGOING  5 Patient will score 56% function on FOTO to signify clinically meaningful improvement in functional abilities.  Update: 52% function - 05/30/2021 07/24/2021: 44% function 09/18/2021 ONGOING    PLAN: PT FREQUENCY: 2x/week   PT DURATION: 8 weeks   PLANNED INTERVENTIONS: Therapeutic exercises, Therapeutic activity, Neuro Muscular re-education, Balance training, Gait training, Patient/Family education, Stair training, Aquatic Therapy, Dry Needling, Cryotherapy, Moist heat, and Manual therapy   PLAN FOR NEXT SESSION:  manual to L-spine, spinal mobility, hip/core strength  Ward Chatters PT 09/03/21 11:27 AM

## 2021-09-05 ENCOUNTER — Ambulatory Visit: Payer: Medicare HMO | Attending: Family Medicine

## 2021-09-05 DIAGNOSIS — M545 Low back pain, unspecified: Secondary | ICD-10-CM | POA: Diagnosis present

## 2021-09-05 DIAGNOSIS — G8929 Other chronic pain: Secondary | ICD-10-CM | POA: Diagnosis present

## 2021-09-05 DIAGNOSIS — M546 Pain in thoracic spine: Secondary | ICD-10-CM | POA: Diagnosis present

## 2021-09-05 DIAGNOSIS — M6281 Muscle weakness (generalized): Secondary | ICD-10-CM | POA: Diagnosis present

## 2021-09-05 NOTE — Therapy (Signed)
OUTPATIENT PHYSICAL THERAPY TREATMENT NOTE/DISCHARGE  PHYSICAL THERAPY DISCHARGE SUMMARY  Visits from Start of Care: 25  Current functional level related to goals / functional outcomes: See goals and objective   Remaining deficits: See goals and objective   Education / Equipment: HEP and POC   Patient agrees to discharge. Patient goals were met. Patient is being discharged due to meeting the stated rehab goals.   Patient Name: [name] MRN: [id number] DOB:1965-05-24, 56 y.o., female Today's Date: 09/05/2021  PCP: Leeanne Rio, MD REFERRING PROVIDER: Leeanne Rio, MD   PT End of Session - 09/05/21 1046     Visit Number 25    Number of Visits 30    Date for PT Re-Evaluation 09/18/21    Authorization Type [employer] primary; MCD secondary    Authorization Time Period Approved 12 visits 07/24/2021-09/06/2021    Authorization - Visit Number 5    Authorization - Number of Visits 12    Progress Note Due on Visit 26    PT Start Time 1045    PT Stop Time 7989    PT Time Calculation (min) 38 min    Activity Tolerance Patient tolerated treatment well    Behavior During Therapy WFL for tasks assessed/performed                               Past Medical History:  Diagnosis Date   Allergy    Anemia    Anxiety    Arthritis    Blood transfusion without reported diagnosis    Cough    Cough 06/11/2018   Difficulty sleeping 06/11/2018   Diverticulitis with perforation 2010   DVT (deep vein thrombosis) in pregnancy 11/24/2016   DVT, lower extremity ([place])    Endometrial cancer ([place])    Enlarged lymph nodes 08/13/2017   Family history of adverse reaction to anesthesia    pts daughter had difficulty awakening following anesthesia, long time to wake up   GERD (gastroesophageal reflux disease)    Headache    Hemoptysis 04/22/2018   History of bronchitis    History of ear infections    Hospital discharge follow-up 03/02/2019   Hx of migraines     Hyperlipidemia    Hypertension    IBS (irritable bowel syndrome)    Kidney infection    Lupus (systemic lupus erythematosus) ([place]) 02/2017   Morbid obesity ([place])    Neck pain 01/13/2019   Ovarian cancer ([place]) dx'd 01/2015   PONV (postoperative nausea and vomiting)    Port-A-Cath in place    Right side   Portacath in place 09/12/2015   Pre-diabetes    Pyelonephritis    Right knee pain 11/28/2011   Scleroderma ([place])    Screen for colon cancer 03/02/2019   Slow rate of speech 08/22/2015   Chronic from CVA.  She also has loss of left nasolabial fold and slight left facial droop/small asymmetry on the left side secondary to CVA   UTI (urinary tract infection) 05/07/2018   Past Surgical History:  Procedure Laterality Date   ABDOMINAL HYSTERECTOMY N/A 03/13/2015   Procedure: TOTAL HYSTERECTOMY ABDOMINAL BILATERAL SALPINGO OOPHORECTOMY RADICAL TUMOR [place];  Surgeon: Everitt Amber, MD;  Location: [facility];  Service: Gynecology;  Laterality: N/A;   BOWEL RESECTION  03/13/2015   Procedure: SMALL BOWEL RESECTION;  Surgeon: Everitt Amber, MD;  Location: [facility];  Service: Gynecology;;   [place]  WITH PROPOFOL N/A 07/19/2020   Procedure: COLONOSCOPY WITH PROPOFOL;  Surgeon: Doran Stabler, MD;  Location: [facility];  Service: Gastroenterology;  Laterality: N/A;   COLOSTOMY  2008   COLOSTOMY TAKEDOWN     ESOPHAGOGASTRODUODENOSCOPY (EGD) WITH PROPOFOL N/A 12/21/2018   Procedure: ESOPHAGOGASTRODUODENOSCOPY (EGD) WITH PROPOFOL;  Surgeon: Doran Stabler, MD;  Location: [facility];  Service: Gastroenterology;  Laterality: N/A;   HEMOSTASIS CLIP PLACEMENT  07/19/2020   Procedure: HEMOSTASIS CLIP PLACEMENT;  Surgeon: Doran Stabler, MD;  Location: [facility];  Service: Gastroenterology;;   LAPAROTOMY N/A 03/13/2015   Procedure: EXPLORATORY LAPAROTOMY;  Surgeon: Everitt Amber, MD;  Location: [facility];  Service: Gynecology;  Laterality: N/A;   POLYPECTOMY  07/19/2020    Procedure: POLYPECTOMY;  Surgeon: Doran Stabler, MD;  Location: [facility];  Service: Gastroenterology;;   TONSILLECTOMY AND [place]  03/13/2015   Procedure: HERNIA REPAIR VENTRAL ADULT;  Surgeon: Everitt Amber, MD;  Location: [facility];  Service: Gynecology;;   Patient Active Problem List   Diagnosis Date Noted   Viral illness 06/05/2021   Chronic diastolic CHF (congestive heart failure) ([place]) 07/19/2020   Bronchospasm 07/19/2020   Incontinence 03/17/2020   Herpes simplex virus (HSV) infection of buttock 01/05/2020   OSA (obstructive sleep apnea) 07/25/2019   Prediabetes 07/25/2019   Lateral epicondylitis of left elbow 01/13/2019   Normocytic anemia 05/29/2018   Scleroderma ([place]) 04/22/2018   Swelling of foot joint, right 04/08/2018   Positive ANA (antinuclear antibody) 09/23/2016   Eczema 01/27/2016   History of pulmonary embolism 09/12/2015   Long term current use of anticoagulant therapy 09/06/2015   Chemotherapy-induced peripheral neuropathy ([place]) 06/13/2015   CKD (chronic kidney disease) stage 3, GFR 30-59 ml/min (HCC) 05/12/2015   GERD (gastroesophageal reflux disease) 05/12/2015   Ovarian carcinosarcoma, 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) 02/19/2015   Essential hypertension, benign    Stress at home 12/14/2014   Left knee pain 08/04/2013   Seasonal allergies 07/29/2013    REFERRING DIAG:  M54.6 (ICD-10-CM) - Acute right-sided thoracic back pain   THERAPY DIAG:  Chronic bilateral low back pain without sciatica  Pain in thoracic spine  Muscle weakness (generalized)  PERTINENT HISTORY:  Currently in remission from endometrial and ovarian cancer   PRECAUTIONS: None  SUBJECTIVE:  Pt presents to PT with continued reports of bilateral knee and lower back pain. She notes that she has maintained HEP and aquatic compliance with no adverse  effect and it does decrease her pain. She is ready to begin PT at this time.   Pain: Are you having pain? Yes NPRS scale: 5/10 lower back; 7/10 L knee Pain location: lower back and L knee PAIN TYPE: aching Pain description: intermittent  Aggravating factors: pulling, lifting, reaching, prolonged standing Relieving factors: rest    OBJECTIVE:   *Unless otherwise noted all objective measures were captured on initial evaluation.   DIAGNOSTIC FINDINGS:  N/A   PATIENT SURVEYS:  FOTO 36% function to 56% predicted  05/30/21: 52% function  07/24/2021: 44% function 09/05/2021: 54% function   LUMBARAROM/PROM   A/PROM A/PROM  04/29/2021  Flexion Full; increased pain low back  Extension Full; decreases pain   Right lateral flexion 25% limited   Left lateral flexion 50% limited, pulling Rt side low back  Right rotation 25% limited   Left rotation 50% limited  pulling Rt side low back    (Blank rows = not tested)     LE MMT:   MMT Right 04/29/2021 Left 04/29/2021 Right 07/24/2021 Left 07/24/2021  Hip flexion 4; pn low back  4+    Hip extension        Hip abduction 3+; pn low back  3+     (Blank rows = not tested)   FUNCTIONAL TESTS:  5 times sit to stand: 10 seconds - 09/05/2021      TODAY'S TREATMENT  [facility] Adult PT Treatment:                                                DATE: 09/05/2021 Therapeutic Exercise: NuStep lvl 6 UE/LE x 4 min while taking subjective Row 3x10 black TB Standing chop x 10 black TB Paloff black TB x 10 each Supine clamshell 3x20 BlackTB Supine PPT with march 2x20 Black TB LTR x 10 each Hooklying hundreds 3x30 Abdominal curl up 3x15 Therapeutic Activity: Assessment of tests/measures, goals, and outcomes for discharge  [facility] Adult PT Treatment:                                                DATE: 09/03/2021 Therapeutic Exercise: NuStep lvl 6 UE/LE x 4 min while taking subjective Row 3x10 20# Shoulder extension 2x10 20# Standing chop x 10 10# LAQ 3x10  6# each Supine clamshell 3x20 BlackTB Supine march 2x20 Black TB Supine horizontal abd 3x10 Black TB Seated physioball rollout x 15 - 5" hold LTR x 10 each Hooklying hundreds 3x30 Abdominal curl up 3x15  [facility] Adult PT Treatment:                                                DATE: 08/29/2021 Therapeutic Exercise: NuStep lvl 4 UE/LE x 4 min while taking subjective Row 3x10 20# Shoulder extension 2x10 20# Standing chop x 10 10# LAQ 3x10 6# each Supine clamshell 3x20 BlackTB Supine march 2x20 Black TB Supine horizontal abd 3x10 Black TB Seated physioball rollout x 15 - 5" hold LTR x 10 each Hooklying hundreds 3x30 Abdominal curl up 3x15 Knee ext 2x10 10# Knee flex 2x10 25#  PATIENT EDUCATION:  Education details: see treatment above  Person educated: Patient Education method: Explanation, Demonstration, Tactile cues, Verbal cues, and Handouts Education comprehension: verbalized understanding, returned demonstration, verbal cues required, tactile cues required, and needs further education     HOME EXERCISE PROGRAM: Aquatic program: Access Code: [email or web address]  Access Code: [email or web address] URL: [email or web address] Date: 09/05/2021 Prepared by: Octavio Manns  Exercises - Standing Shoulder Row with Anchored Resistance  - 3-4 x weekly - 3 sets - 10 reps - Standing Shoulder Horizontal Abduction with Resistance  - 3-4 x weekly - 3 sets - 10 reps - Curl Up with Reach  - 1 x daily - 3-4 x weekly - 3 sets - 15 reps - Supine Lower Trunk Rotation  - 3-4 x weekly - 2 sets - 10 reps - The Hundred 1 Beginner  - 1 x daily - 3-4 x weekly - 3 sets - 20  reps - Supine Pelvic Tilt  - 2 x daily - 3-4 x weekly - 2 sets - 10 reps - Supine March with Posterior Pelvic Tilt  - 3-4 x weekly - 3 sets - 20 reps - black theraband hold - Hooklying Clamshell with Resistance  - 3-4 x weekly - 3 sets - 20 reps - black theraband hold - Standing Anti-Rotation Press with Anchored Resistance  - 3-4 x weekly  - 3 sets - 10 reps - 10-15lbs or black theraband hold - Standing Diagonal Chop  - 3-4 x weekly - 3 sets - 10 reps - 10-15lbs or black theraband hold    ASSESSMENT: Pt was able to complete all prescribed exercises and demonstrated knowledge of HEP with no adverse effect. Over the course of PT treatment she has greatly improved her strength and functional activity tolerance. Her back pain is significantly reduced with HEP compliance and her FOTO score showed improved subjective functional ability. She should continue to improve with HEP compliance and will discharge from skilled PT at this time.   Problem List: decreased balance, decreased endurance, decreased mobility, difficulty walking, decreased strength, and pain   GOALS: Goals reviewed with patient? No     LONG TERM GOALS:    LTG Name Target Date Goal status  1 Patient will demonstrate pain free lumbar AROM to improve ability to complete bending and lifting activity.  Baseline:see objective  09/18/2021 MET  2 Patient will complete 5 x STS in < 12 seconds without increased back pain to signify improvements in functional strength.  Baseline: see objective 06/11/2021: 14 seconds 07/24/2021: 13 seconds 09/05/2021: 10 seconds 09/18/2021 MET  3 Patient will tolerate at least 20 minutes of standing activity to improve tolerance to cleaning and grocery shopping Baseline: reports frequent rest breaks with cleaning at home and can only tolerate walking 1-2 aisles at grocery store currently.  09/18/2021 MET  4 Patient will be independent with advanced home program to assist in management of her chronic condition.  Baseline: initial HEP issued  09/18/2021 MET  5 Patient will score 56% function on FOTO to signify clinically meaningful improvement in functional abilities.  Update: 52% function - 05/30/2021 07/24/2021: 44% function 09/05/2021: 54% function 09/18/2021 MOSTLY MET    PLAN: PT FREQUENCY: 2x/week   PT DURATION: 8 weeks   PLANNED  INTERVENTIONS: Therapeutic exercises, Therapeutic activity, Neuro Muscular re-education, Balance training, Gait training, Patient/Family education, Stair training, Aquatic Therapy, Dry Needling, Cryotherapy, Moist heat, and Manual therapy   PLAN FOR NEXT SESSION:  manual to L-spine, spinal mobility, hip/core strength  Ward Chatters PT 09/05/21 11:29 AM

## 2021-09-08 ENCOUNTER — Other Ambulatory Visit: Payer: Self-pay | Admitting: Family Medicine

## 2021-09-12 ENCOUNTER — Other Ambulatory Visit: Payer: Self-pay

## 2021-09-12 MED ORDER — VALACYCLOVIR HCL 500 MG PO TABS: ORAL_TABLET | ORAL | 2 refills | Status: DC

## 2021-09-20 ENCOUNTER — Ambulatory Visit: Payer: Medicare HMO

## 2021-10-31 ENCOUNTER — Ambulatory Visit (INDEPENDENT_AMBULATORY_CARE_PROVIDER_SITE_OTHER): Payer: Medicare HMO | Admitting: Student

## 2021-10-31 VITALS — BP 131/71 | HR 78 | Temp 97.7°F | Ht 65.0 in | Wt 359.0 lb

## 2021-10-31 DIAGNOSIS — J0101 Acute recurrent maxillary sinusitis: Secondary | ICD-10-CM

## 2021-10-31 MED ORDER — DOXYCYCLINE HYCLATE 100 MG PO TABS: 100.0000 mg | ORAL_TABLET | Freq: Two times a day (BID) | ORAL | 0 refills | Status: AC

## 2021-10-31 MED ORDER — FLUTICASONE PROPIONATE 50 MCG/ACT NA SUSP: 2.0000 | Freq: Every day | NASAL | 6 refills | Status: DC

## 2021-10-31 NOTE — Patient Instructions (Signed)
It was great to see you today! Thank you for choosing [facility] for your primary care. [name] was seen for upper respiratory symptoms.   General Recommendations:   Please drink plenty of fluids. Get plenty of rest  Sleep in humidified air Use saline nasal sprays Netti pot   OTC Medications:  Decongestants - helps relieve congestion  Flonase (generic fluticasone) or Nasacort (generic triamcinolone) - please make sure to use the "cross-over" technique at a 45 degree angle towards the opposite eye as opposed to straight up the nasal passageway.  Sudafed (generic pseudoephedrine - Note this is the one that is available behind the pharmacy counter); Products with phenylephrine (-PE) may also be used but is often not as effective as pseudoephedrine.  If you have HIGH BLOOD PRESSURE - Coricidin HBP; AVOID any product that is -D as this contains pseudoephedrine which may increase your blood pressure. Afrin (oxymetazoline) every 6-8 hours for up to 3 days.   Allergies - helps relieve runny nose, itchy eyes and sneezing  Claritin (generic loratidine), Allegra (fexofenidine), or Zyrtec (generic cyrterizine) for runny nose. These medications should not cause drowsiness. Note - Benadryl (generic diphenhydramine) may be used however may cause drowsiness  Cough -  Delsym or Robitussin (generic dextromethorphan)  Expectorants - helps loosen mucus to ease removal  Mucinex (generic guaifenesin) as directed on the package.  Headaches / General Aches  Tylenol (generic acetaminophen) - DO NOT EXCEED 3 grams (3,000 mg) in a 24 hour time period Advil/Motrin (generic ibuprofen)   Sore Throat -  Salt water gargle  Chloraseptic (generic benzocaine) spray or lozenges / Sucrets (generic dyclonine)    Sinusitis Sinusitis is redness, soreness, and inflammation of the paranasal sinuses. Paranasal sinuses are air pockets within the bones of your face (beneath the eyes, the middle  of the forehead, or above the eyes). In healthy paranasal sinuses, mucus is able to drain out, and air is able to circulate through them by way of your nose. However, when your paranasal sinuses are inflamed, mucus and air can become trapped. This can allow bacteria and other germs to grow and cause infection. Sinusitis can develop quickly and last only a short time (acute) or continue over a long period (chronic). Sinusitis that lasts for more than 12 weeks is considered chronic.  CAUSES  Causes of sinusitis include: Allergies. Structural abnormalities, such as displacement of the cartilage that separates your nostrils (deviated septum), which can decrease the air flow through your nose and sinuses and affect sinus drainage. Functional abnormalities, such as when the small hairs (cilia) that line your sinuses and help remove mucus do not work properly or are not present. SIGNS AND SYMPTOMS  Symptoms of acute and chronic sinusitis are the same. The primary symptoms are pain and pressure around the affected sinuses. Other symptoms include: Upper toothache. Earache. Headache. Bad breath. Decreased sense of smell and taste. A cough, which worsens when you are lying flat. Fatigue. Fever. Thick drainage from your nose, which often is green and may contain pus (purulent). Swelling and warmth over the affected sinuses. DIAGNOSIS  Your health care provider will perform a physical exam. During the exam, your health care provider may: Look in your nose for signs of abnormal growths in your nostrils (nasal polyps). Tap over the affected sinus to check for signs of infection. View the inside of your sinuses (endoscopy) using an imaging device that has a light attached (endoscope). If your health care provider suspects that you have  chronic sinusitis, one or more of the following tests may be recommended: Allergy tests. Nasal culture. A sample of mucus is taken from your nose, sent to a lab, and  screened for bacteria. Nasal cytology. A sample of mucus is taken from your nose and examined by your health care provider to determine if your sinusitis is related to an allergy. TREATMENT  Most cases of acute sinusitis are related to a viral infection and will resolve on their own within 10 days. Sometimes medicines are prescribed to help relieve symptoms (pain medicine, decongestants, nasal steroid sprays, or saline sprays).  However, for sinusitis related to a bacterial infection, your health care provider will prescribe antibiotic medicines. These are medicines that will help kill the bacteria causing the infection. We will continue with doxycycline today to take twice daily for 5 days and flonase  Rarely, sinusitis is caused by a fungal infection. In theses cases, your health care provider will prescribe antifungal medicine. For some cases of chronic sinusitis, surgery is needed. Generally, these are cases in which sinusitis recurs more than 3 times per year, despite other treatments. HOME CARE INSTRUCTIONS  Drink plenty of water. Water helps thin the mucus so your sinuses can drain more easily. Use a humidifier. Inhale steam 3 to 4 times a day (for example, sit in the bathroom with the shower running). Apply a warm, moist washcloth to your face 3 to 4 times a day, or as directed by your health care provider. Use saline nasal sprays to help moisten and clean your sinuses. Take medicines only as directed by your health care provider. If you were prescribed either an antibiotic or antifungal medicine, finish it all even if you start to feel better. SEEK IMMEDIATE MEDICAL CARE IF: You have increasing pain or severe headaches. You have nausea, vomiting, or drowsiness. You have swelling around your face. You have vision problems. You have a stiff neck. You have difficulty breathing. MAKE SURE YOU:  Understand these instructions. Will watch your condition. Will get help right away if you are  not doing well or get worse.   If you haven't already, sign up for My Chart to have easy access to your labs results, and communication with your primary care physician.   You should return to our clinic Return if symptoms worsen or fail to improve.  I recommend that you always bring your medications to each appointment as this makes it easy to ensure you are on the correct medications and helps [place] not miss refills when you need them.  Please arrive 15 minutes before your appointment to ensure smooth check in process.  We appreciate your efforts in making this happen.  Please call the clinic at [phone] if your symptoms worsen or you have any concerns.  Thank you for allowing me to participate in your care, Erskine Emery, MD 10/31/2021, 11:10 AM PGY-2, [place]

## 2021-10-31 NOTE — Assessment & Plan Note (Signed)
Take antibiotic as directed, doxy x5 days given length of time.  Drink lots of fluids.  Treat sympotmatically with Mucinex, nasal saline irrigation, and Tylenol/Ibuprofen. Continue with Flonase is discussed  You can use warm compresses.   Call if not improving as expected in 5-7 days.

## 2021-10-31 NOTE — Progress Notes (Signed)
  SUBJECTIVE:   CHIEF COMPLAINT / HPI:   Upper respiratory symptoms She complains of achiness, congestion, facial pain, headache described as pressure, and possible low grade fever (100.4) last week, took Mucinex . Pt with chills.  Onset of symptoms was a few weeks ago and worsening. She is drinking plenty of fluids.   Past history is significant for asthma and pneumonia.  Patient is non-smoker Has albuterol inhaler  Used a couple of times with coughing fits   Has been hoarse but improving  Multiple episodes of this in the past, but never this long   PERTINENT  PMH / PSH:   OSA, diastolic CHF, HTN  OBJECTIVE:  BP 131/71   Pulse 78   Temp 97.7 F (36.5 C)   Ht '5\' 5"'$  (1.651 m)   Wt (!) 359 lb (162.8 kg)   LMP 10/16/2013 (Approximate)   SpO2 100%   BMI 59.74 kg/m   General: NAD, pleasant, able to participate in exam; very pleasant AAF  HEENT: boggy nasal turbinates, erythema in the throat with no tonsillar hypertrophy and midline uvula, no LAD Cardiac: RRR, no murmurs auscultated Respiratory: CTAB, normal WOB Skin: warm and dry, no rashes noted Psych: Normal affect and mood  ASSESSMENT/PLAN:  Acute recurrent maxillary sinusitis Take antibiotic as directed, doxy x5 days given length of time.  Drink lots of fluids.  Treat sympotmatically with Mucinex, nasal saline irrigation, and Tylenol/Ibuprofen. Continue with Flonase is discussed  You can use warm compresses.   Call if not improving as expected in 5-7 days.    No orders of the defined types were placed in this encounter.  Meds ordered this encounter  Medications   doxycycline (VIBRA-TABS) 100 MG tablet    Sig: Take 1 tablet (100 mg total) by mouth 2 (two) times daily for 5 days.    Dispense:  10 tablet    Refill:  0   fluticasone (FLONASE) 50 MCG/ACT nasal spray    Sig: Place 2 sprays into both nostrils daily.    Dispense:  16 g    Refill:  6   Return if symptoms worsen or fail to improve. Erskine Emery,  MD 10/31/2021, 11:32 AM PGY-2, [place]

## 2021-11-01 ENCOUNTER — Other Ambulatory Visit: Payer: Self-pay

## 2021-11-01 DIAGNOSIS — M545 Low back pain, unspecified: Secondary | ICD-10-CM

## 2021-11-01 MED ORDER — BACLOFEN 10 MG PO TABS: 10.0000 mg | ORAL_TABLET | Freq: Two times a day (BID) | ORAL | 0 refills | Status: DC | PRN

## 2021-11-12 ENCOUNTER — Ambulatory Visit (INDEPENDENT_AMBULATORY_CARE_PROVIDER_SITE_OTHER): Payer: Medicare HMO

## 2021-11-12 ENCOUNTER — Ambulatory Visit (HOSPITAL_COMMUNITY)
Admission: EM | Admit: 2021-11-12 | Discharge: 2021-11-12 | Disposition: A | Discharge status: Home / Self Care | Payer: Medicare HMO | Attending: Family Medicine | Admitting: Family Medicine

## 2021-11-12 ENCOUNTER — Encounter (HOSPITAL_COMMUNITY): Payer: Self-pay | Admitting: Emergency Medicine

## 2021-11-12 ENCOUNTER — Other Ambulatory Visit: Payer: Self-pay

## 2021-11-12 DIAGNOSIS — R059 Cough, unspecified: Secondary | ICD-10-CM | POA: Diagnosis not present

## 2021-11-12 DIAGNOSIS — R0602 Shortness of breath: Secondary | ICD-10-CM

## 2021-11-12 DIAGNOSIS — J189 Pneumonia, unspecified organism: Secondary | ICD-10-CM

## 2021-11-12 MED ORDER — PROMETHAZINE-DM 6.25-15 MG/5ML PO SYRP: 5.0000 mL | ORAL_SOLUTION | Freq: Four times a day (QID) | ORAL | 0 refills | Status: DC | PRN

## 2021-11-12 MED ORDER — LEVOFLOXACIN 500 MG PO TABS: 500.0000 mg | ORAL_TABLET | Freq: Every day | ORAL | 0 refills | Status: AC

## 2021-11-12 MED ORDER — AZITHROMYCIN 250 MG PO TABS: 250.0000 mg | ORAL_TABLET | Freq: Every day | ORAL | 0 refills | Status: DC

## 2021-11-12 MED ORDER — BENZONATATE 100 MG PO CAPS: 100.0000 mg | ORAL_CAPSULE | Freq: Three times a day (TID) | ORAL | 0 refills | Status: DC

## 2021-11-12 NOTE — ED Triage Notes (Signed)
A week ago saw pcp and was told she had a sinus infection, treated with flonase and antibiotics.    Has finished antibiotics.    Patient is coughing up phlegm, coughing particularly at night.  Patient has a history of ovarian cancer and endometriosis.    Patient has pictures of bloody phlegm.    Patient has a nebulizer at home and has been using it.    Reports calling her provider office and was told they could not seen her today

## 2021-11-12 NOTE — ED Provider Notes (Signed)
[facility]    CSN: [id number] Arrival date & time: 11/12/21  1123      History   Chief Complaint Chief Complaint  Patient presents with   Shortness of Breath    HPI [name] is a 56 y.o. female.   Patient presents with intermittent fevers, bilateral ear fullness, rhinorrhea, productive cough with specks of blood in the sputum, shortness of breath at rest and exertion and wheezing for 3 weeks.  Was evaluated by her PCP 1 week ago, diagnosed with sinusitis and started on doxycycline and Flonase.  Endorses symptoms did begin to improve but once medications stopped they worsened.  Has had difficulty sleeping and has been unable to lie flat due to coughing and shortness of breath.  Has attempted use of albuterol nebulizer and inhaler which have been minimally effective.    Past Medical History:  Diagnosis Date   Allergy    Anemia    Anxiety    Arthritis    Blood transfusion without reported diagnosis    Cough    Cough 06/11/2018   Difficulty sleeping 06/11/2018   Diverticulitis with perforation 2010   DVT (deep vein thrombosis) in pregnancy 11/24/2016   DVT, lower extremity (HCC)    Endometrial cancer ([place])    Enlarged lymph nodes 08/13/2017   Family history of adverse reaction to anesthesia    pts daughter had difficulty awakening following anesthesia, long time to wake up   GERD (gastroesophageal reflux disease)    Headache    Hemoptysis 04/22/2018   History of bronchitis    History of ear infections    Hospital discharge follow-up 03/02/2019   Hx of migraines    Hyperlipidemia    Hypertension    IBS (irritable bowel syndrome)    Kidney infection    Lupus (systemic lupus erythematosus) ([place]) 02/2017   Morbid obesity ([place])    Neck pain 01/13/2019   Ovarian cancer ([place]) dx'd 01/2015   PONV (postoperative nausea and vomiting)    Port-A-Cath in place    Right side   Portacath in place 09/12/2015   Pre-diabetes    Pyelonephritis    Right knee pain  11/28/2011   Scleroderma ([place])    Screen for colon cancer 03/02/2019   Slow rate of speech 08/22/2015   Chronic from CVA.  She also has loss of left nasolabial fold and slight left facial droop/small asymmetry on the left side secondary to CVA   UTI (urinary tract infection) 05/07/2018    Patient Active Problem List   Diagnosis Date Noted   Acute recurrent maxillary sinusitis 10/31/2021   Viral illness 06/05/2021   Chronic diastolic CHF (congestive heart failure) ([place]) 07/19/2020   Bronchospasm 07/19/2020   Incontinence 03/17/2020   Herpes simplex virus (HSV) infection of buttock 01/05/2020   OSA (obstructive sleep apnea) 07/25/2019   Prediabetes 07/25/2019   Lateral epicondylitis of left elbow 01/13/2019   Normocytic anemia 05/29/2018   Scleroderma ([place]) 04/22/2018   Swelling of foot joint, right 04/08/2018   Positive ANA (antinuclear antibody) 09/23/2016   Eczema 01/27/2016   History of pulmonary embolism 09/12/2015   Long term current use of anticoagulant therapy 09/06/2015   Chemotherapy-induced peripheral neuropathy ([place]) 06/13/2015   CKD (chronic kidney disease) stage 3, GFR 30-59 ml/min ([place]) 05/12/2015   GERD (gastroesophageal reflux disease) 05/12/2015   Ovarian carcinosarcoma, 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   Endometrial ca ([place]) 03/29/2015   Ventral hernia without obstruction or gangrene    Morbid obesity ([place]) 02/19/2015  Essential hypertension, benign    Stress at home 12/14/2014   Left knee pain 08/04/2013   Seasonal allergies 07/29/2013    Past Surgical History:  Procedure Laterality Date   ABDOMINAL HYSTERECTOMY N/A 03/13/2015   Procedure: TOTAL HYSTERECTOMY ABDOMINAL BILATERAL SALPINGO OOPHORECTOMY RADICAL TUMOR [place];  Surgeon: Everitt Amber, MD;  Location: [facility];  Service: Gynecology;  Laterality: N/A;   BOWEL RESECTION  03/13/2015   Procedure: SMALL BOWEL RESECTION;  Surgeon: Everitt Amber, MD;  Location: [facility];  Service: Gynecology;;   [place] and  1984   COLONOSCOPY WITH PROPOFOL N/A 07/19/2020   Procedure: COLONOSCOPY WITH PROPOFOL;  Surgeon: Doran Stabler, MD;  Location: [facility];  Service: Gastroenterology;  Laterality: N/A;   COLOSTOMY  2008   COLOSTOMY TAKEDOWN     ESOPHAGOGASTRODUODENOSCOPY (EGD) WITH PROPOFOL N/A 12/21/2018   Procedure: ESOPHAGOGASTRODUODENOSCOPY (EGD) WITH PROPOFOL;  Surgeon: Doran Stabler, MD;  Location: [facility];  Service: Gastroenterology;  Laterality: N/A;   HEMOSTASIS CLIP PLACEMENT  07/19/2020   Procedure: HEMOSTASIS CLIP PLACEMENT;  Surgeon: Doran Stabler, MD;  Location: [facility];  Service: Gastroenterology;;   LAPAROTOMY N/A 03/13/2015   Procedure: EXPLORATORY LAPAROTOMY;  Surgeon: Everitt Amber, MD;  Location: [facility];  Service: Gynecology;  Laterality: N/A;   POLYPECTOMY  07/19/2020   Procedure: POLYPECTOMY;  Surgeon: Doran Stabler, MD;  Location: [facility];  Service: Gastroenterology;;   TONSILLECTOMY AND [place]  03/13/2015   Procedure: HERNIA REPAIR VENTRAL ADULT;  Surgeon: Everitt Amber, MD;  Location: [facility];  Service: Gynecology;;    OB History     Gravida  2   Para      Term      Preterm      AB      Living  2      SAB      IAB      Ectopic      Multiple      Live Births  2            Home Medications    Prior to Admission medications   Medication Sig Start Date End Date Taking? Authorizing Provider  acetaminophen (TYLENOL) 500 MG tablet Take 2 tablets (1,000 mg total) by mouth every 6 (six) hours as needed for moderate pain. 03/14/21   Alen Bleacher, MD  albuterol (VENTOLIN HFA) 108 (90 Base) MCG/ACT inhaler Inhale 2 puffs into the lungs every 6 (six) hours as needed for wheezing or shortness of breath. 12/20/20   Leeanne Rio, MD  amLODipine (NORVASC) 5 MG tablet Take 1 tablet (5 mg total) by mouth daily. 01/18/21   Elouise Munroe, MD  B-D UF III MINI PEN NEEDLES 31G X 5 MM MISC  07/28/19    [provider]  baclofen (LIORESAL) 10 MG tablet Take 1 tablet (10 mg total) by mouth 2 (two) times daily as needed for muscle spasms. 11/01/21   Leeanne Rio, MD  Cholecalciferol (VITAMIN D) 50 MCG (2000 UT) CAPS Take 8,000 Units by mouth daily.    [provider]  diclofenac Sodium (VOLTAREN) 1 % GEL Apply to knee once daily as needed 03/14/21   Alen Bleacher, MD  fesoterodine (TOVIAZ) 8 MG TB24 tablet TAKE 1 TABLET(8 MG) BY MOUTH DAILY 05/07/21   Zenia Resides, MD  fluticasone (FLONASE) 50 MCG/ACT nasal spray Place 2 sprays into both nostrils daily. 10/31/21   Erskine Emery, MD  furosemide (LASIX) 20 MG tablet Take 20 mg by mouth daily as needed for edema.    [provider]  Insulin Pen Needle 31G X 5 MM MISC Use as directed with Victoza 09/16/19   [provider]  lidocaine (LIDODERM) 5 % Place 1 patch onto the skin daily. Remove & Discard patch within 12 hours or as directed by MD 03/14/21   Alen Bleacher, MD  lidocaine-prilocaine (EMLA) cream Apply 1 application topically as needed (port access).    [provider]  liraglutide (VICTOZA) 18 MG/3ML SOPN Inject 1.8 mg into the skin daily. 07/21/20   Doreatha Lew, MD  metoprolol tartrate (LOPRESSOR) 50 MG tablet Take 1 tablet (50 mg total) by mouth once for 1 dose. PLEASE TAKE METOPROLOL 2  HOURS PRIOR TO CTA SCAN. 01/18/21 01/18/21  Elouise Munroe, MD  omeprazole (PRILOSEC) 40 MG capsule TAKE 1 CAPSULE(40 MG) BY MOUTH TWICE DAILY BEFORE A MEAL 06/24/21   Noralyn Pick, NP  triamcinolone cream (KENALOG) 0.1 % Apply 1 application topically 2 (two) times daily as needed. APPLY TO THE AFFECTED AREA TWICE DAILY AS NEEDED FOR SKIN IRRITATION 04/19/21   Lilland, Alana, DO  valACYclovir (VALTREX) 500 MG tablet TAKE 1 TABLET(500 MG) BY MOUTH TWICE DAILY FOR 3 DAYS. START OF OUTBREAK 09/12/21   Leeanne Rio, MD  XARELTO 20 MG TABS tablet TAKE 1 TABLET(20 MG) BY MOUTH EVERY  MORNING 09/10/21   Leeanne Rio, MD    Family History Family History  Problem Relation Age of Onset   Diabetes Mother    Hypertension Mother    Hyperlipidemia Mother    Colon polyps Mother        4 total   Fibroids Mother        s/p hysterectomy   Hyperlipidemia Father    Diabetes Daughter    Hodgkin's lymphoma Daughter 14   Asthma Maternal Aunt        severe - d. 24   Prostate cancer Maternal Uncle 25   Diabetes Paternal Aunt    Diabetes Paternal Uncle    Kidney failure Maternal Grandmother 84   Pancreatic cancer Paternal Grandmother        dx. >50   Diabetes Paternal Grandfather    Diabetes Paternal Aunt    Heart disease Neg Hx    Stroke Neg Hx    Colon cancer Neg Hx    Stomach cancer Neg Hx    Esophageal cancer Neg Hx    Rectal cancer Neg Hx     Social History Social History   Tobacco Use   Smoking status: Never   Smokeless tobacco: Never  Vaping Use   Vaping Use: Never used  Substance Use Topics   Alcohol use: Yes    Comment: Maybe wine every 6 months   Drug use: No     Allergies   Penicillins   Review of Systems Review of Systems  Constitutional:  Positive for fever. Negative for activity change, appetite change, chills, diaphoresis, fatigue and unexpected weight change.  HENT:  Positive for ear pain and rhinorrhea. Negative for congestion, dental problem, drooling, ear discharge, facial swelling, hearing loss, mouth sores, nosebleeds, postnasal drip, sinus pressure, sinus pain, sneezing, sore throat, tinnitus, trouble swallowing and voice change.   Respiratory:  Positive for cough, shortness of breath and wheezing. Negative for apnea, choking, chest tightness and stridor.   Cardiovascular: Negative.   Skin: Negative.      Physical Exam Triage Vital Signs ED Triage  Vitals  Enc Vitals Group     BP 11/12/21 1140 122/66     Pulse Rate 11/12/21 1140 89     Resp 11/12/21 1140 (!) 24     Temp 11/12/21 1140 98.8 F (37.1 C)     Temp Source  11/12/21 1140 Oral     SpO2 11/12/21 1140 96 %     Weight --      Height --      Head Circumference --      Peak Flow --      Pain Score 11/12/21 1137 5     Pain Loc --      Pain Edu? --      Excl. in [place]? --    No data found.  Updated Vital Signs BP 122/66 (BP Location: Right Arm) Comment (BP Location): regular cuff, forearm  Pulse 89   Temp 98.8 F (37.1 C) (Oral)   Resp (!) 24   LMP 10/16/2013 (Approximate)   SpO2 96%   Visual Acuity Right Eye Distance:   Left Eye Distance:   Bilateral Distance:    Right Eye Near:   Left Eye Near:    Bilateral Near:     Physical Exam Constitutional:      Appearance: Normal appearance.  HENT:     Head: Normocephalic.     Right Ear: Tympanic membrane, ear canal and external ear normal.     Left Ear: Tympanic membrane, ear canal and external ear normal.     Nose: Rhinorrhea present.     Mouth/Throat:     Mouth: Mucous membranes are moist.     Pharynx: Oropharynx is clear.  Eyes:     Extraocular Movements: Extraocular movements intact.  Cardiovascular:     Rate and Rhythm: Normal rate and regular rhythm.     Pulses: Normal pulses.     Heart sounds: Normal heart sounds.  Pulmonary:     Comments: Diminished in all lobes Musculoskeletal:     Cervical back: Normal range of motion and neck supple.  Skin:    General: Skin is warm and dry.  Neurological:     Mental Status: She is alert and oriented to person, place, and time. Mental status is at baseline.  Psychiatric:        Mood and Affect: Mood normal.      UC Treatments / Results  Labs (all labs ordered are listed, but only abnormal results are displayed) Labs Reviewed - No data to display  EKG   Radiology DG Chest 2 View  Result Date: 11/12/2021 CLINICAL DATA:  Shortness of breath, cough EXAM: CHEST - 2 VIEW COMPARISON:  Previous studies including the chest radiographs done on 07/19/2020 FINDINGS: Cardiac size is within normal limits. Extensive new patchy  infiltrates are seen in right lung. There are small patchy infiltrates in left lung. Costophrenic angles are clear. There is no pneumothorax. Tip of right IJ chest port is seen in superior vena cava. IMPRESSION: Extensive patchy infiltrates are seen in right lung. There are small scattered patchy infiltrates in left lung. Findings suggest multifocal pneumonia, more so in the right lung. There is no significant pleural effusion or pneumothorax. Electronically Signed   By: Elmer Picker M.D.   On: 11/12/2021 12:10    Procedures Procedures (including critical care time)  Medications Ordered in UC Medications - No data to display  Initial Impression / Assessment and Plan / UC Course  I have reviewed the triage vital signs and the nursing notes.  Pertinent labs & imaging results that were available during my care of the patient were reviewed by me and considered in my medical decision making (see chart for details).  Mulitfocal pneumonia  Vital signs are stable, O2 saturation 96% on room air, lung sounds are diminished and chest x-ray to confirm pneumonia within multiple lumps, discussed findings with patient, prescribed Levaquin daily for 10 days and a Z-Pak as well as Tessalon and Promethazine DM for management of coughing, recommended continued use of albuterol nebulizer and inhalers for shortness of breath and wheezing, advised follow-up with PCP in 10 days and given strict precautions if symptoms continue to worsen she is to go to the nearest emergency department for further management, verbalized understanding Final Clinical Impressions(s) / UC Diagnoses   Final diagnoses:  Multifocal pneumonia     Discharge Instructions      Your chest x-ray shows pneumonia within multiple lobes of your lungs  Levaquin every morning for 10 days, ideally you will begin to see improvement in the next 48 hours  Take azithromycin every morning with food as directed on packaging  If your symptoms  continue to worsen please go to the nearest emergency department for evaluation for IV medications     ED Prescriptions   None    PDMP not reviewed this encounter.   Hans Eden, NP 11/12/21 1351

## 2021-11-12 NOTE — Discharge Instructions (Addendum)
Your chest x-ray shows pneumonia within multiple lobes of your lungs  Levaquin every morning for 10 days, ideally you will begin to see improvement in the next 48 hours  Take azithromycin every morning with food as directed on packaging  Take Tessalon pill every 8 hours as needed to help with coughing,  May use cough syrup every 6 hours for additional comfort, be mindful this medication may make you drowsy  You may continue to prop yourself up until your breathing improves while sleeping and resting  You may attempt use of any additional over-the-counter medications as needed for comfort  The blood in your sputum will improve as infection clears  Schedule follow-up appointment in 10 days for reevaluation by your primary doctor  If your symptoms continue to worsen please go to the nearest emergency department for evaluation for IV medications

## 2021-11-18 ENCOUNTER — Telehealth: Payer: Self-pay

## 2021-11-18 NOTE — Telephone Encounter (Signed)
If she is still having fevers despite being on antibiotics, I would like her to be seen before this Thursday. Can she be scheduled with someone tomorrow? It looks like PM ATC has slots open. She should keep the appointment with me Thursday for regular follow up but if we can get her in tomorrow as well that would be better.  Thanks Leeanne Rio, MD

## 2021-11-18 NOTE — Telephone Encounter (Signed)
Patient contacted and scheduled tomorrow in ATC.   Patient requested to cancel Thursdays apt with PCP.   Patient reports she will call back to reschedule with PCP.

## 2021-11-18 NOTE — Telephone Encounter (Signed)
Patient calls nurse line regarding recent urgent care visit. She was diagnosed with pneumonia on 11/12/21.  She is taking two antibiotics (Azithromycin and Levofloxacin and taking cough medications. She has also been taking tylenol. Patient reports that she continues to run low grade fever. Tmax 100.4. Reports improvement of symptoms. She has been using nebulizer and inhalers as needed. She states that she has not had to use inhaler or nebulizer in the last three days.   Patient was concerned due to continued low grade fever, as she has been having symptoms since last Tuesday. Recommended that patient continue current management and follow up with Dr. [name] on 8/17.  Red flags discussed.   Please advise if you have any additional recommendations between now and scheduled appointment.   Talbot Grumbling, RN

## 2021-11-19 ENCOUNTER — Ambulatory Visit (INDEPENDENT_AMBULATORY_CARE_PROVIDER_SITE_OTHER): Payer: Medicare HMO | Admitting: Student

## 2021-11-19 DIAGNOSIS — J189 Pneumonia, unspecified organism: Secondary | ICD-10-CM | POA: Diagnosis not present

## 2021-11-19 NOTE — Assessment & Plan Note (Signed)
Improving Continue Levaquin course If fevers return/breathing worsens/symptoms worsen, patient to return to clinic and might need repeat CXR and/or steroids +/- further antibiotic course Return precautions discussed

## 2021-11-19 NOTE — Patient Instructions (Signed)
It was great seeing you today. I am so glad you are feeling a bit better.  Continue your Levaquin.  If your breathing worsens, your fever worsens, or you are feeling much worse please come back to be seen.   If you have any questions or concerns, please feel free to call the clinic.    Be well,  Dr. Orvis Brill [facility] [phone]

## 2021-11-19 NOTE — Progress Notes (Signed)
    SUBJECTIVE:   CHIEF COMPLAINT / HPI:   Multifocal pneumonia/persistent fever follow-up Feeling much better but wanted to come in to make sure she was doing well Did have some borderline fevers yesterday 8/14 to 99.7 but afebrile today Feels her breathing is much improved Having significant fatigue that she had in prior pneumonia episodes and says she requried PT to regain strength  Yesterday 100.4 yesterday, 100.7 and then broke yesterday 99.7 Used inhaler and nebulizer for 3-4 days but has not had to use it since then.  Still taking Levaquin (day #8/10) and completed 5-day Z-pak  PERTINENT  PMH / PSH: Reviewed  OBJECTIVE:   BP 125/69   Pulse 81   Temp 98.8 F (37.1 C) (Oral)   Ht '5\' 5"'$  (1.651 m)   Wt (!) 357 lb 2 oz (162 kg)   LMP 10/16/2013 (Approximate)   SpO2 100%   BMI 59.43 kg/m   General: Well-appearing, ambulates with cane, sitting in chair wearing mask Resp: Normal WOB on room air, no wheezing or crackles. Diminished lung sounds throughout but maintaining normal SpO2 CV: RRR, no murmurs Skin: Warm, dry   ASSESSMENT/PLAN:   Multifocal pneumonia Improving Continue Levaquin course If fevers return/breathing worsens/symptoms worsen, patient to return to clinic and might need repeat CXR and/or steroids +/- further antibiotic course Return precautions discussed     Orvis Brill, [place]

## 2021-11-21 ENCOUNTER — Ambulatory Visit: Payer: Medicare HMO | Admitting: Family Medicine

## 2021-12-08 ENCOUNTER — Other Ambulatory Visit: Payer: Self-pay | Admitting: Family Medicine

## 2021-12-08 ENCOUNTER — Other Ambulatory Visit: Payer: Self-pay | Admitting: Nurse Practitioner

## 2021-12-08 ENCOUNTER — Other Ambulatory Visit: Payer: Self-pay | Admitting: Internal Medicine

## 2021-12-10 ENCOUNTER — Telehealth: Payer: Self-pay

## 2021-12-10 NOTE — Telephone Encounter (Signed)
Patient calls nurse line regarding stomach pain. She reports that for the last two weeks she has been experiencing pain "in the top of my stomach." This occurs after eating and will resolve within a few minutes. She has been taking omeprazole as prescribed.   Denies fever, nausea, vomiting or diarrhea.   Scheduled patient for next available appointment with Dr. Nita Sells on 9/12. Red flags discussed.   Talbot Grumbling, RN

## 2021-12-16 NOTE — Progress Notes (Unsigned)
    SUBJECTIVE:   CHIEF COMPLAINT / HPI:   [name] is a 56 y.o. female who presents to the [facility] clinic today to discuss the following concerns:   Cough, Shortness of Breath Coughed up blood yesterday. On Friday she was having sinus pressure, runny nose and was coughing "like crazy". She had to take a breathing treatment. She felt fine on Saturday. On Sunday she felt like she had no energy, felt drained. This morning again she awoke, was coughing and feeling short of breath. Felt like she couldn't catch her breath if she laid down. No fevers. Has a coughed up yellow mucus but sometimes has dry cough. Feels she is wheezing. No chest pain. No recent travel or surgeries. Does have hx of blood clots in lungs and legs and is on xarelto. No sick contacts. Denies edema.   She was seen on 8/8 for shortness of breath, was dx with multifocal pneumonia, given rx for 10-day course of Levaquin and Azithromycin. She felt like she was improving. Prior to that she had just finished a course of Doxycycline for a presumed sinus infection.  She reports this would be the third or fourth bout of pneumonia in the last 2-3 years.  PERTINENT  PMH / PSH: GERD, HTN, HLD, CKD stage 3, ovarian and endometrial cancer (currently in remission)   OBJECTIVE:   BP (!) 149/71   Pulse 79   Wt (!) 372 lb 12.8 oz (169.1 kg)   LMP 10/16/2013 (Approximate)   SpO2 99%   BMI 62.04 kg/m    General: NAD, pleasant, able to participate in exam Cardiac: RRR, no murmurs. Respiratory: CTAB, normal effort, No wheezes, rales or rhonchi Skin: warm and dry, no rashes noted Psych: Normal affect and mood  ASSESSMENT/PLAN:   1. Need for shingles vaccine Sent to pharmacy. - Zoster Vaccine Adjuvanted [facility]) injection; Inject 0.5 mLs into the muscle once for 1 dose.  Dispense: 0.5 mL; Refill: 0  2. Primary hypertension BP elevated today. Unfortunately, not rechecked. Compliant with medications. May need adjustment in the  future if she is persistently elevated.  - Microalbumin/Creatinine Ratio, Urine - Continue current regimen - F/u for recheck in 1 month   3. Stage 3 chronic kidney disease, unspecified whether stage 3a or 3b CKD (HCC) - Microalbumin/Creatinine Ratio, Urine  4. Acute cough Acute onset without fevers. Intermittent sputum production. She recently finished course of abx for multifocal pneumonia. She appeared non-toxic today with normal lung examination and no increased respiratory effort even with ambulation to lab. Will check labs and imaging and next steps will be pending those results.  - CBC with Differential  5. Shortness of breath Acute onset. Echo from 2021 shows EF of 60 to 65% with grade 1 diastolic dysfunction, mild LVH. She is on Lasix PRN for leg edema. Differential includes PNA, HF exacerbation. Feel PE less likely as she is not tachycardic, normal SpO2 and she is adherent to her Xarelto. She denies any changes to weight or edema but will assess further with lab work.  - Brain natriuretic peptide - DG Chest 2 View; Future  [name], [place]

## 2021-12-17 ENCOUNTER — Ambulatory Visit (HOSPITAL_COMMUNITY)
Admission: RE | Admit: 2021-12-17 | Discharge: 2021-12-17 | Disposition: A | Discharge status: Home / Self Care | Payer: Medicare HMO | Source: Ambulatory Visit | Attending: Family Medicine | Admitting: Family Medicine

## 2021-12-17 ENCOUNTER — Ambulatory Visit (INDEPENDENT_AMBULATORY_CARE_PROVIDER_SITE_OTHER): Payer: Medicare HMO | Admitting: Family Medicine

## 2021-12-17 VITALS — BP 149/71 | HR 79 | Wt 372.8 lb

## 2021-12-17 DIAGNOSIS — R0602 Shortness of breath: Secondary | ICD-10-CM | POA: Diagnosis not present

## 2021-12-17 DIAGNOSIS — N183 Chronic kidney disease, stage 3 unspecified: Secondary | ICD-10-CM

## 2021-12-17 DIAGNOSIS — R051 Acute cough: Secondary | ICD-10-CM

## 2021-12-17 DIAGNOSIS — Z23 Encounter for immunization: Secondary | ICD-10-CM

## 2021-12-17 DIAGNOSIS — I10 Essential (primary) hypertension: Secondary | ICD-10-CM

## 2021-12-17 MED ORDER — ZOSTER VAC RECOMB ADJUVANTED 50 MCG/0.5ML IM SUSR: 0.5000 mL | Freq: Once | INTRAMUSCULAR | 0 refills | Status: AC

## 2021-12-18 ENCOUNTER — Other Ambulatory Visit: Payer: Self-pay | Admitting: Family Medicine

## 2021-12-18 DIAGNOSIS — R0602 Shortness of breath: Secondary | ICD-10-CM

## 2021-12-18 DIAGNOSIS — J189 Pneumonia, unspecified organism: Secondary | ICD-10-CM

## 2021-12-18 LAB — CBC WITH DIFFERENTIAL/PLATELET
Basophils Absolute: 0 10*3/uL (ref 0.0–0.2)
Basos: 0 %
EOS (ABSOLUTE): 0.1 10*3/uL (ref 0.0–0.4)
Eos: 1 %
Hematocrit: 33.4 % — ABNORMAL LOW (ref 34.0–46.6)
Hemoglobin: 11 g/dL — ABNORMAL LOW (ref 11.1–15.9)
Immature Grans (Abs): 0 10*3/uL (ref 0.0–0.1)
Immature Granulocytes: 0 %
Lymphocytes Absolute: 1.8 10*3/uL (ref 0.7–3.1)
Lymphs: 17 %
MCH: 28.3 pg (ref 26.6–33.0)
MCHC: 32.9 g/dL (ref 31.5–35.7)
MCV: 86 fL (ref 79–97)
Monocytes Absolute: 0.8 10*3/uL (ref 0.1–0.9)
Monocytes: 8 %
Neutrophils Absolute: 7.8 10*3/uL — ABNORMAL HIGH (ref 1.4–7.0)
Neutrophils: 74 %
Platelets: 208 10*3/uL (ref 150–450)
RBC: 3.89 x10E6/uL (ref 3.77–5.28)
RDW: 14 % (ref 11.7–15.4)
WBC: 10.6 10*3/uL (ref 3.4–10.8)

## 2021-12-18 LAB — MICROALBUMIN / CREATININE URINE RATIO
Creatinine, Urine: 169.5 mg/dL
Microalb/Creat Ratio: 3 mg/g creat (ref 0–29)
Microalbumin, Urine: 4.8 ug/mL

## 2021-12-18 LAB — BRAIN NATRIURETIC PEPTIDE: BNP: 43.4 pg/mL (ref 0.0–100.0)

## 2021-12-18 MED ORDER — BENZONATATE 100 MG PO CAPS: 100.0000 mg | ORAL_CAPSULE | Freq: Three times a day (TID) | ORAL | 0 refills | Status: DC

## 2021-12-19 ENCOUNTER — Telehealth: Payer: Self-pay

## 2021-12-19 NOTE — Telephone Encounter (Signed)
Patient calls nurse line regarding questions with referral. Advised that referral is pending and pulmonologist should contact her in a few weeks to schedule new patient appointment.   Patient is asking if provider has any additional recommendations for management of pneumonia until establishing care with specialist. Advised patient that tessalon capsules had been sent to pharmacy to help with symptoms. She was unsure if she needed any other medications.   Will forward to Dr. Nita Sells.   Talbot Grumbling, RN

## 2021-12-19 NOTE — Telephone Encounter (Signed)
Called patient to discuss but unable to reach so voicemail left with recommendations. At this point do not suspect a re-infection that requires additional abx. Continue conservative measures until evaluated by Pulmonology. Lung exam, oxygen saturation, blood work all reassuring at this time.

## 2021-12-20 ENCOUNTER — Other Ambulatory Visit: Payer: Self-pay

## 2021-12-20 MED ORDER — ALBUTEROL SULFATE HFA 108 (90 BASE) MCG/ACT IN AERS: 2.0000 | INHALATION_SPRAY | Freq: Four times a day (QID) | RESPIRATORY_TRACT | 3 refills | Status: DC | PRN

## 2021-12-20 NOTE — Telephone Encounter (Signed)
Please let patient know I am refilling this medication, but I'd like her to schedule a follow up visit with me at her convenience since it's been >1 year since I saw her.  Thanks, Leeanne Rio, MD

## 2021-12-23 ENCOUNTER — Telehealth: Payer: Self-pay

## 2021-12-23 MED ORDER — ALBUTEROL SULFATE (2.5 MG/3ML) 0.083% IN NEBU: 2.5000 mg | INHALATION_SOLUTION | Freq: Four times a day (QID) | RESPIRATORY_TRACT | 1 refills | Status: DC | PRN

## 2021-12-23 NOTE — Telephone Encounter (Signed)
Patient calls nurse line requesting a refill on nebulizer solution.   Patient reports she did schedule an apt with PCP for 10/5.  I do not see this medication on current medication list.  Will forward to PCP.   [facility] on [address].

## 2021-12-23 NOTE — Telephone Encounter (Signed)
Rx sent in Leeanne Rio, MD

## 2022-01-09 ENCOUNTER — Other Ambulatory Visit: Payer: Self-pay

## 2022-01-09 ENCOUNTER — Ambulatory Visit: Payer: Medicare HMO | Admitting: Family Medicine

## 2022-01-09 ENCOUNTER — Encounter: Payer: Self-pay | Admitting: Family Medicine

## 2022-01-09 ENCOUNTER — Ambulatory Visit (HOSPITAL_BASED_OUTPATIENT_CLINIC_OR_DEPARTMENT_OTHER): Payer: Medicare HMO | Admitting: Family Medicine

## 2022-01-09 VITALS — BP 131/90 | HR 80 | Wt 359.6 lb

## 2022-01-09 DIAGNOSIS — L309 Dermatitis, unspecified: Secondary | ICD-10-CM

## 2022-01-09 DIAGNOSIS — Z23 Encounter for immunization: Secondary | ICD-10-CM

## 2022-01-09 DIAGNOSIS — K219 Gastro-esophageal reflux disease without esophagitis: Secondary | ICD-10-CM | POA: Diagnosis not present

## 2022-01-09 DIAGNOSIS — I10 Essential (primary) hypertension: Secondary | ICD-10-CM | POA: Diagnosis not present

## 2022-01-09 DIAGNOSIS — G4733 Obstructive sleep apnea (adult) (pediatric): Secondary | ICD-10-CM

## 2022-01-09 MED ORDER — BENZONATATE 100 MG PO CAPS: 100.0000 mg | ORAL_CAPSULE | Freq: Three times a day (TID) | ORAL | 0 refills | Status: DC | PRN

## 2022-01-09 MED ORDER — TRIAMCINOLONE ACETONIDE 0.1 % EX CREA: 1.0000 | TOPICAL_CREAM | Freq: Two times a day (BID) | CUTANEOUS | 0 refills | Status: DC | PRN

## 2022-01-09 MED ORDER — TETANUS-DIPHTH-ACELL PERTUSSIS 5-2.5-18.5 LF-MCG/0.5 IM SUSY: 0.5000 mL | PREFILLED_SYRINGE | Freq: Once | INTRAMUSCULAR | 0 refills | Status: AC

## 2022-01-09 MED ORDER — SHINGRIX 50 MCG/0.5ML IM SUSR: INTRAMUSCULAR | 1 refills | Status: DC

## 2022-01-09 NOTE — Progress Notes (Signed)
  Date of Visit: 01/09/2022   HPI:  [name] is a 56 y.o. here for f/u:  Cough: Patient was diagnosed with pneumonia in August. Since then, she has a chronic cough that is worse at night. She uses Tessalon '100mg'$  nightly and Promethazine-DM. She has been using her albuterol inhaler 2x per day during this illness. She would like a refill of her cough medications.  Denies having young children in her home.  Stores her medication safely.  Does have upcoming appointment with pulmonology in less than a week for this ongoing cough.  HTN: Patient takes Amlodipine '5mg'$  daily. Her home blood pressures are 120s/70s. She denies chest pain and headaches. She has not taken Amlodipine today.  OSA: She was using CPAP machine <4 hours per night as she was waking up due to her hernia. She returned her CPAP machine because she had to pay out-of-pocket for not using the machine >4 hours per night. She would like another CPAP prescription.  Was told by [facility] pulmonology that she would need a new sleep study in order to qualify for another CPAP.  She does not want to do another sleep study if at all possible.  GERD: She is taking Omeprazole '40mg'$  daily. Symptoms are well controlled.  Weight loss: She continues to do water aerobics and an exercise program. She plans to get bariatric surgery. She has to be ~300lbs before she can proceed with surgery.  Eczema: Patient uses Kenalog cream as needed for her face. She does not use the cream daily. Her flares occur approximately 2x/week.  PHYSICAL EXAM: BP (!) 131/90   Pulse 80   Wt (!) 359 lb 9.6 oz (163.1 kg)   LMP 10/16/2013 (Approximate)   SpO2 99%   BMI 59.84 kg/m  Gen: Well appearing in no distress CV: Normal rate. No murmurs, rubs, or gallops. Normal S1 and S2. Pulm: Normal work of breathing. No wheezing or crackles. Neuro: Alert and oriented.  ASSESSMENT/PLAN:  Cough -Refilled Tessalon '100mg'$  -Discussed importance of storing medication  properly as it is dangerous especially to kids -Follow-up with pulmonologist on 10/11 for cough  Essential hypertension, benign Patient has not taken Amlodipine yet. Her blood pressure is appropriately elevated today at 153/86 and 131/90 on recheck. Home blood pressures are ~120s/70s.  -Continue Amlodipine '5mg'$  daily.  OSA (obstructive sleep apnea) From last pulmonologist note, patient needs a sleep study for a CPAP machine. Given costs of sleep study, encouraged patient to follow-up with pulmonologist on 10/11 for OSA and CPAP prescription.  GERD (gastroesophageal reflux disease) Stable. Continue Omeprazole '40mg'$  daily.  Eczema Stable. Refilled Kenalog cream today. Encouraged moisturizing after using Kenalog to prevent hypopigmentation and skin atrophy.  Morbid obesity ([place]) Continue to work on weight loss with healthy diet and exercise.   HM Flu vaccine today. Rx provided for Shingles and Tdap. She plans to get updated COVID vaccine.  Follow-up Return for annual physical.  Leonette Nutting, [place]  Patient seen along with MS3 student Leonette Nutting. I personally evaluated this patient along with the student, and verified all aspects of the history, physical exam, and medical decision making as documented by the student. I agree with the student's documentation and have made all necessary edits.  Chrisandra Netters, MD  [employer]

## 2022-01-09 NOTE — Patient Instructions (Signed)
It was great to see you again today!  Refilled eczema cream and cough pills Talk to pulmonologist about CPAP and cough  Get shingles and tetanus shots at your pharmacy  Schedule physical at your convenience  Be well, Dr. [name]

## 2022-01-09 NOTE — Assessment & Plan Note (Signed)
Stable. Refilled Kenalog cream today. Encouraged moisturizing after using Kenalog to prevent hypopigmentation and skin atrophy.

## 2022-01-09 NOTE — Assessment & Plan Note (Signed)
Stable. Continue Omeprazole '40mg'$  daily.

## 2022-01-09 NOTE — Assessment & Plan Note (Signed)
From last pulmonologist note, patient needs a sleep study for a CPAP machine. Given costs of sleep study, encouraged patient to follow-up with pulmonologist on 10/11 for OSA and CPAP prescription.

## 2022-01-09 NOTE — Assessment & Plan Note (Signed)
Patient has not taken Amlodipine yet. Her blood pressure is appropriately elevated today at 153/86 and 131/90 on recheck. Home blood pressures are ~120s/70s.  -Continue Amlodipine '5mg'$  daily.

## 2022-01-09 NOTE — Assessment & Plan Note (Signed)
Continue to work on weight loss with healthy diet and exercise.

## 2022-01-15 ENCOUNTER — Ambulatory Visit (INDEPENDENT_AMBULATORY_CARE_PROVIDER_SITE_OTHER): Payer: Medicare HMO | Admitting: Pulmonary Disease

## 2022-01-15 ENCOUNTER — Ambulatory Visit (INDEPENDENT_AMBULATORY_CARE_PROVIDER_SITE_OTHER): Payer: Medicare HMO

## 2022-01-15 ENCOUNTER — Encounter: Payer: Self-pay | Admitting: Pulmonary Disease

## 2022-01-15 VITALS — BP 128/72 | HR 86 | Temp 98.1°F | Ht 65.5 in | Wt 363.2 lb

## 2022-01-15 DIAGNOSIS — T17908D Unspecified foreign body in respiratory tract, part unspecified causing other injury, subsequent encounter: Secondary | ICD-10-CM

## 2022-01-15 DIAGNOSIS — K219 Gastro-esophageal reflux disease without esophagitis: Secondary | ICD-10-CM

## 2022-01-15 DIAGNOSIS — J189 Pneumonia, unspecified organism: Secondary | ICD-10-CM

## 2022-01-15 MED ORDER — ANORO ELLIPTA 62.5-25 MCG/ACT IN AEPB: 1.0000 | INHALATION_SPRAY | Freq: Every day | RESPIRATORY_TRACT | 0 refills | Status: DC

## 2022-01-15 NOTE — Progress Notes (Signed)
$'@Patient'Z$  ID: [name], female    DOB: 1965/06/24, 56 y.o.   MRN: [id number]  Chief Complaint  Patient presents with   Consult    Pt has had a cough for 6-7 months, coughs a lot at night. Intermittent SOB.    Referring provider: Lind Covert, *  HPI:   56 y.o. woman whom we are seeing in consultation for evaluation of recurrent pneumonia.  Most recent PCP note x2 reviewed.  Patient reports history of recurrent pneumonia.  For started after diagnosis of concomitant ovarian and endometrial cancer.  She received chemotherapy for this.  Also had hysterectomy.  Last chemotherapy dose about 6 years ago.  Notably, port still in place.  Ever since then she has gotten pneumonias at least once a year.  Treated with antibiotics and usually symptoms improved with course of weeks.  She reports recent aspiration pneumonia, diagnosed earlier in 2023.  Treated with antibiotics improved.  She had recurrent pneumonia 11/2021 on chest x-ray, my review and interpretation reveals nodular opacities bilaterally, right greater than left with more confluent densities right lower lung field.  Symptoms were preceded by sinus infection.  She received antibiotics for this.  Developed pneumonia in spite of this.  Required 2 additional courses of antibiotics.  Repeat chest x-ray 12/2021 my review interpretation reveals similar distribution of nodular opacities bilaterally right greater than left with interval decrease in size and decrease confluence in the right lower lung field indicative of improving infectious pneumonia.  She had the Streptococcus immunization last year.  She reports he has a hiatal hernia.  She reports significant reflux symptoms.  On a PPI which helps with the burning but bad reflux.  She wakes up on a nightly basis with coughing.  PMH: Ovarian and endometrial cancer in remission, GERD, childhood asthma, history of recurrent PE on chronic Xarelto Surgical history: Hysterectomy, bowel  resection, C-section, ventral hernia repair, tonsillectomy Family history: Mother with diabetes, hypertension, hyperlipidemia, father with hyperlipidemia Social history: Never smoker, lives in [place] / Pulmonary Flowsheets:   ACT:      No data to display          MMRC:     No data to display          Epworth:      No data to display          Tests:   FENO:  No results found for: "NITRICOXIDE"  PFT:     No data to display          WALK:      No data to display          Imaging: Personally reviewed and as per EMR and discussion in this note DG Chest 2 View  Result Date: 12/18/2021 CLINICAL DATA:  56 year old female with shortness of breath EXAM: CHEST - 2 VIEW COMPARISON:  11/12/2021, 07/19/2020, chest CT 07/19/2020 FINDINGS: Cardiomediastinal silhouette unchanged in size and contour. Unchanged right IJ port catheter. Similar distribution of patchy airspace and interstitial opacities bilaterally, without significant change since the prior. No pneumothorax or pleural effusion. Degenerative changes of the spine. No displaced fracture IMPRESSION: Essentially unchanged pattern of mixed airspace and interstitial opacities of the bilateral lungs, right greater than left, most compatible with multifocal infection. Multifocal pulmonary hemorrhage could also have this appearance. Electronically Signed   By: Corrie Mckusick D.O.   On: 12/18/2021 12:48    Lab Results: Personally reviewed CBC    Component Value Date/Time  WBC 10.6 12/17/2021 1447   WBC 8.5 12/19/2020 1424   RBC 3.89 12/17/2021 1447   RBC 4.33 12/19/2020 1424   HGB 11.0 (L) 12/17/2021 1447   HGB 11.6 01/17/2016 0928   HCT 33.4 (L) 12/17/2021 1447   HCT 35.2 01/17/2016 0928   PLT 208 12/17/2021 1447   MCV 86 12/17/2021 1447   MCV 93.1 01/17/2016 0928   MCH 28.3 12/17/2021 1447   MCH 29.4 07/21/2020 0411   MCHC 32.9 12/17/2021 1447   MCHC 32.2 12/19/2020 1424   RDW 14.0  12/17/2021 1447   RDW 14.0 01/17/2016 0928   LYMPHSABS 1.8 12/17/2021 1447   LYMPHSABS 1.9 01/17/2016 0928   MONOABS 0.6 11/26/2020 1108   MONOABS 0.5 01/17/2016 0928   EOSABS 0.1 12/17/2021 1447   BASOSABS 0.0 12/17/2021 1447   BASOSABS 0.0 01/17/2016 0928    BMET    Component Value Date/Time   NA 141 01/22/2021 1022   NA 140 01/17/2016 0928   K 4.5 01/22/2021 1022   K 4.8 01/17/2016 0928   CL 103 01/22/2021 1022   CO2 25 01/22/2021 1022   CO2 22 01/17/2016 0928   GLUCOSE 81 01/22/2021 1022   GLUCOSE 95 12/11/2020 1050   GLUCOSE 103 01/17/2016 0928   BUN 20 01/22/2021 1022   BUN 28.7 (H) 01/17/2016 0928   CREATININE 0.98 01/22/2021 1022   CREATININE 1.1 01/17/2016 0928   CALCIUM 9.6 01/22/2021 1022   CALCIUM 10.0 01/17/2016 0928   GFRNONAA 52 (L) 07/21/2020 0411   GFRNONAA 77 02/26/2015 0945   GFRAA 59 (L) 07/20/2019 1004   GFRAA 89 02/26/2015 0945    BNP    Component Value Date/Time   BNP 43.4 12/17/2021 1447   BNP 181.4 (H) 07/19/2020 2253    ProBNP No results found for: "PROBNP"  Specialty Problems       Pulmonary Problems   OSA (obstructive sleep apnea)   Bronchospasm   Acute recurrent maxillary sinusitis   Multifocal pneumonia    Allergies  Allergen Reactions   Penicillins Rash    Has patient had a PCN reaction causing immediate rash, facial/tongue/throat swelling, SOB or lightheadedness with hypotension: no Has patient had a PCN reaction causing severe rash involving mucus membranes or skin necrosis: no Has patient had a PCN reaction that required hospitalization in the hospital at the time Has patient had a PCN reaction occurring within the last 10 years: no If all of the above answers are "NO", then may proceed with Cephalosporin use.     Immunization History  Administered Date(s) Administered   Influenza Split 06/10/2011   Influenza,inj,Quad PF,6+ Mos 12/12/2014, 01/17/2016, 12/04/2016, 01/14/2018, 02/01/2019, 01/05/2020, 12/20/2020,  01/09/2022   Moderna Sars-Covid-2 Vaccination 06/24/2019, 07/25/2019   PFIZER(Purple Top)SARS-COV-2 Vaccination 03/15/2020   PNEUMOCOCCAL CONJUGATE-20 12/20/2020   PPD Test 02/02/2019   Td 07/11/2008    Past Medical History:  Diagnosis Date   Allergy    Anemia    Anxiety    Arthritis    Blood transfusion without reported diagnosis    Cough    Cough 06/11/2018   Difficulty sleeping 06/11/2018   Diverticulitis with perforation 2010   DVT (deep vein thrombosis) in pregnancy 11/24/2016   DVT, lower extremity (HCC)    Endometrial cancer ([place])    Enlarged lymph nodes 08/13/2017   Family history of adverse reaction to anesthesia    pts daughter had difficulty awakening following anesthesia, long time to wake up   GERD (gastroesophageal reflux disease)    Headache  Hemoptysis 04/22/2018   History of bronchitis    History of ear infections    Hospital discharge follow-up 03/02/2019   Hx of migraines    Hyperlipidemia    Hypertension    IBS (irritable bowel syndrome)    Kidney infection    Lupus (systemic lupus erythematosus) ([place]) 02/2017   Morbid obesity ([place])    Neck pain 01/13/2019   Ovarian cancer ([place]) dx'd 01/2015   PONV (postoperative nausea and vomiting)    Port-A-Cath in place    Right side   Portacath in place 09/12/2015   Pre-diabetes    Pyelonephritis    Right knee pain 11/28/2011   Scleroderma ([place])    Screen for colon cancer 03/02/2019   Slow rate of speech 08/22/2015   Chronic from CVA.  She also has loss of left nasolabial fold and slight left facial droop/small asymmetry on the left side secondary to CVA   UTI (urinary tract infection) 05/07/2018    Tobacco History: Social History   Tobacco Use  Smoking Status Never  Smokeless Tobacco Never   Counseling given: Not Answered   Continue to not smoke  Outpatient Encounter Medications as of 01/15/2022  Medication Sig   acetaminophen (TYLENOL) 500 MG tablet Take 2 tablets (1,000 mg total) by mouth every 6  (six) hours as needed for moderate pain.   albuterol (PROVENTIL) (2.5 MG/3ML) 0.083% nebulizer solution Take 3 mLs (2.5 mg total) by nebulization every 6 (six) hours as needed for wheezing or shortness of breath.   albuterol (VENTOLIN HFA) 108 (90 Base) MCG/ACT inhaler Inhale 2 puffs into the lungs every 6 (six) hours as needed for wheezing or shortness of breath.   amLODipine (NORVASC) 5 MG tablet Take 1 tablet (5 mg total) by mouth daily.   B-D UF III MINI PEN NEEDLES 31G X 5 MM MISC    baclofen (LIORESAL) 10 MG tablet Take 1 tablet (10 mg total) by mouth 2 (two) times daily as needed for muscle spasms.   benzonatate (TESSALON) 100 MG capsule Take 1 capsule (100 mg total) by mouth 3 (three) times daily as needed for cough.   Cholecalciferol (VITAMIN D) 50 MCG (2000 UT) CAPS Take 8,000 Units by mouth daily.   diclofenac Sodium (VOLTAREN) 1 % GEL Apply to knee once daily as needed   fesoterodine (TOVIAZ) 8 MG TB24 tablet TAKE 1 TABLET(8 MG) BY MOUTH DAILY   fluticasone (FLONASE) 50 MCG/ACT nasal spray Place 2 sprays into both nostrils daily.   furosemide (LASIX) 20 MG tablet Take 20 mg by mouth daily as needed for edema.   Insulin Pen Needle 31G X 5 MM MISC Use as directed with Victoza   lidocaine (LIDODERM) 5 % Place 1 patch onto the skin daily. Remove & Discard patch within 12 hours or as directed by MD   lidocaine-prilocaine (EMLA) cream Apply 1 application topically as needed (port access).   liraglutide (VICTOZA) 18 MG/3ML SOPN Inject 1.8 mg into the skin daily.   omeprazole (PRILOSEC) 40 MG capsule Take 1 capsule (40 mg total) by mouth 2 (two) times daily before a meal. **PLEASE CALL OFFICE TO SCHEDULE FOLLOW UP   promethazine-dextromethorphan (PROMETHAZINE-DM) 6.25-15 MG/5ML syrup Take 5 mLs by mouth 4 (four) times daily as needed for cough.   triamcinolone cream (KENALOG) 0.1 % Apply 1 Application topically 2 (two) times daily as needed. APPLY TO THE AFFECTED AREA TWICE DAILY AS NEEDED FOR  SKIN IRRITATION   valACYclovir (VALTREX) 500 MG tablet TAKE 1 TABLET(500 MG) BY  MOUTH TWICE DAILY FOR 3 DAYS. START OF OUTBREAK   XARELTO 20 MG TABS tablet TAKE 1 TABLET(20 MG) BY MOUTH EVERY MORNING   [DISCONTINUED] Zoster Vaccine Adjuvanted [facility]) injection Administer Shingrix vaccination now and repeat in two months   metoprolol tartrate (LOPRESSOR) 50 MG tablet Take 1 tablet (50 mg total) by mouth once for 1 dose. PLEASE TAKE METOPROLOL 2  HOURS PRIOR TO CTA SCAN.   No facility-administered encounter medications on file as of 01/15/2022.     Review of Systems  Review of Systems  No chest pain with exertion.  No orthopnea or PND.  Comprehensive review of systems otherwise negative. Physical Exam  BP 128/72 (BP Location: Left Arm, Patient Position: Sitting, Cuff Size: Large)   Pulse 86   Temp 98.1 F (36.7 C) (Oral)   Ht 5' 5.5" (1.664 m)   Wt (!) 363 lb 3.2 oz (164.7 kg)   LMP 10/16/2013 (Approximate)   SpO2 100%   BMI 59.52 kg/m   Wt Readings from Last 5 Encounters:  01/15/22 (!) 363 lb 3.2 oz (164.7 kg)  01/09/22 (!) 359 lb 9.6 oz (163.1 kg)  12/17/21 (!) 372 lb 12.8 oz (169.1 kg)  11/19/21 (!) 357 lb 2 oz (162 kg)  10/31/21 (!) 359 lb (162.8 kg)    BMI Readings from Last 5 Encounters:  01/15/22 59.52 kg/m  01/09/22 59.84 kg/m  12/17/21 62.04 kg/m  11/19/21 59.43 kg/m  10/31/21 59.74 kg/m     Physical Exam General: Sitting in chair, no acute distress Eyes: EOMI, no icterus Neck: Supple, no JVP Pulmonary: Clear, normal work of breathing Cardiovascular warm, no edema Abdomen: Nondistended, bowel sounds present MSK: No synovitis, no joint effusion Neuro: Normal gait, no weakness Psych: Normal mood, full affect   Assessment & Plan:   Recurrent pneumonias: Presumably normal immune system.  We will screen with IgM, IgG, IgA.  We will check strep pneumococcal titers given had immunization last calendar year.  She has a hiatal hernia and endorses  coughing at night frequently.  High suspicion for silent or frank aspiration particular with lying supine related to hiatal hernia and reflux causing recurrent pneumonias and x-ray changes.  Repeat chest x-ray today, chest x-ray 12/2021 improved compared to 11/2021 with lingering infiltrates.  Consider cross-sectional imaging if abnormalities persist.  GERD/recurrent aspiration: Continue PPI.  Counseled lifestyle modification many which she is doing related to diet and time of eating.  Recommend wedge for elevating head post on bed.  Fortunately, she had recently ordered a wedge pillow.  If this is not comfortable instructed her on how to elevate head of bed with phone that she has at home.  She has been losing weight and was congratulated on this effort.  Has additional 50 or so pounds to get before bariatric surgery and what sounds like plan Niesen fundoplication.  I think her symptoms of cough as well as recurrent pneumonias will be difficult to control with hiatal hernia unsecured.  Asthma: Diagnosed as a child.  Symptoms not too bad as older adult.  Does use albuterol with coughing fits and with some shortness of breath.  Particular when she is ill with pneumonia.  She does find this beneficial.  Trial dual bronchodilators via an Anoro, samples today.  Avoiding ICS given recurrent pneumonias.   Return in about 3 months (around 04/17/2022).   Lanier Clam, MD 01/15/2022

## 2022-01-15 NOTE — Patient Instructions (Signed)
Nice to meet you  Try this new inhaler Anoro 1 puff once a day.  If it is helpful let me know and I will prescribe it and we can see if the insurance company will allow [place].  There are no steroids in this, I am avoiding this given the repeat pneumonias.  We will get a chest x-ray today to make sure the recent pneumonia continues to improve, I thought it looked better in September compared to August  I will get labs today to check for obvious deficiencies in your immune system  My suspicion is that there is reflux at night and causing silent aspiration or frank aspiration leading to the changes on your chest x-ray and repeat pneumonias  Return to clinic in 3 months or sooner as needed with Dr. Silas Flood

## 2022-01-16 LAB — IGG, IGA, IGM
IgG (Immunoglobin G), Serum: 910 mg/dL (ref 600–1640)
IgM, Serum: 215 mg/dL (ref 50–300)
Immunoglobulin A: 168 mg/dL (ref 47–310)

## 2022-01-16 MED ORDER — ANORO ELLIPTA 62.5-25 MCG/ACT IN AEPB: 1.0000 | INHALATION_SPRAY | Freq: Every day | RESPIRATORY_TRACT | 2 refills | Status: DC

## 2022-01-16 NOTE — Progress Notes (Signed)
Chest x-ray is clear, pneumonia that has been improving on serial chest x-rays now resolved or gone.  There is a little bit of residual inflammation in the air tubes likely resolving or improving bronchitis.

## 2022-01-23 LAB — STREP PNEUMONIAE 23 SEROTYPES IGG
Serotype 17 (17F): <0.3
Serotype 2 (2): 86.8
Serotype 20 (20): 1
Serotype 22 (22F): 0.5
Serotype 34 (10A): 1.5
Serotype 43 (11A): <0.3
Serotype 54 (15B): 0.6
Serotype 57 (19A): <0.3
Serotype 70 (33F): 1.4
Strep pneumo Type 12: 0.5
Strep pneumo Type 19: 0.9
Strep pneumo Type 4: <0.3
Strep pneumo Type 9: 3.8
Strep pneumoniae Type 1 Abs: 3.5
Strep pneumoniae Type 14 Abs: 7.1
Strep pneumoniae Type 18C Abs: 8.1
Strep pneumoniae Type 23F Abs: 16.3
Strep pneumoniae Type 3 Abs: <0.3
Strep pneumoniae Type 5 Abs: 1.8
Strep pneumoniae Type 6B Abs: <0.3
Strep pneumoniae Type 7F Abs: <0.3
Strep pneumoniae Type 8 Abs: <0.3
Strep pneumoniae Type 9N Abs: 0.7

## 2022-01-27 NOTE — Progress Notes (Signed)
Cardiology Office Note:    Date:  01/28/2022   ID:  [name], DOB 16-Apr-1965, MRN [id number]  PCP:  Leeanne Rio, MD   [place] Providers Cardiologist:  Elouise Munroe, MD Cardiology APP:  Ledora Bottcher, PA     Referring MD: Leeanne Rio, MD   Chief Complaint  Patient presents with   Follow-up    History of Present Illness:    [name] is a 56 y.o. female with a hx of morbid obesity, OSA on CPAP, DM2, endometrial and ovarian cancer s/p surgery and chemotherapy, HTN, HLD, and DVT/PE on xarelto. She was seen by Dr. Margaretann Loveless 07/12/20 and was awaiting upcoming colonoscopy for GI symptoms including vomiting after some meals. She underwent colonoscopy on 07/20/20 and was told she was coughing during the procedure. On the way home, she was SOB with chest pressure. She presented to [facility]. She was hypoxic, tachycardic, and had a mild troponin elevation. She also reported hemoptysis. CTA negative for recurrent PE, but did show LLL PNA with questionable aspiration. Cardiology was consulted. HST elevation felt due to demand ischemia in the setting of hypoxia and tachycardia. No additional cardiac testing at that time, but if symptoms persisted, she may require OP stress testing. I saw her in follow up 2022 and she was doing well.  In response to work-up for bariatric surgery, she underwent coronary CTA 01/25/2021 that showed a coronary calcium score of 0.  She was seen by Diona Browner NP 04/23/21 and had lost over 40 pounds with diet and exercise in anticipation of her bariatric surgery.  She presents today for follow up. She is recovering from PNA and lost most of September. Her bariatric surgery was put on hold while she recovers. She is anticipating gastric sleeve. Her father was recently diagnosed with lung cancer. She is a little depressed from this. She would like to proceed with surgery but has also had recurrent PNA due to hiatal hernia that will  be corrected at the time of her gastric sleeve. She has no cardiac complaints today. She takes 20 mg lasix PRN for swelling - she has not taken lasix in several months.   Past Medical History:  Diagnosis Date   Allergy    Anemia    Anxiety    Arthritis    Blood transfusion without reported diagnosis    Cough    Cough 06/11/2018   Difficulty sleeping 06/11/2018   Diverticulitis with perforation 2010   DVT (deep vein thrombosis) in pregnancy 11/24/2016   DVT, lower extremity (HCC)    Endometrial cancer ([place])    Enlarged lymph nodes 08/13/2017   Family history of adverse reaction to anesthesia    pts daughter had difficulty awakening following anesthesia, long time to wake up   GERD (gastroesophageal reflux disease)    Headache    Hemoptysis 04/22/2018   History of bronchitis    History of ear infections    Hospital discharge follow-up 03/02/2019   Hx of migraines    Hyperlipidemia    Hypertension    IBS (irritable bowel syndrome)    Kidney infection    Lupus (systemic lupus erythematosus) ([place]) 02/2017   Morbid obesity ([place])    Neck pain 01/13/2019   Ovarian cancer ([place]) dx'd 01/2015   PONV (postoperative nausea and vomiting)    Port-A-Cath in place    Right side   Portacath in place 09/12/2015   Pre-diabetes    Pyelonephritis    Right knee  pain 11/28/2011   Scleroderma ([place])    Screen for colon cancer 03/02/2019   Slow rate of speech 08/22/2015   Chronic from CVA.  She also has loss of left nasolabial fold and slight left facial droop/small asymmetry on the left side secondary to CVA   UTI (urinary tract infection) 05/07/2018    Past Surgical History:  Procedure Laterality Date   ABDOMINAL HYSTERECTOMY N/A 03/13/2015   Procedure: TOTAL HYSTERECTOMY ABDOMINAL BILATERAL SALPINGO OOPHORECTOMY RADICAL TUMOR [place];  Surgeon: Everitt Amber, MD;  Location: [facility];  Service: Gynecology;  Laterality: N/A;   BOWEL RESECTION  03/13/2015   Procedure: SMALL BOWEL RESECTION;  Surgeon: Everitt Amber, MD;  Location: [facility];  Service: Gynecology;;   [place] and 1984   COLONOSCOPY WITH PROPOFOL N/A 07/19/2020   Procedure: COLONOSCOPY WITH PROPOFOL;  Surgeon: Doran Stabler, MD;  Location: [facility];  Service: Gastroenterology;  Laterality: N/A;   COLOSTOMY  2008   COLOSTOMY TAKEDOWN     ESOPHAGOGASTRODUODENOSCOPY (EGD) WITH PROPOFOL N/A 12/21/2018   Procedure: ESOPHAGOGASTRODUODENOSCOPY (EGD) WITH PROPOFOL;  Surgeon: Doran Stabler, MD;  Location: [facility];  Service: Gastroenterology;  Laterality: N/A;   HEMOSTASIS CLIP PLACEMENT  07/19/2020   Procedure: HEMOSTASIS CLIP PLACEMENT;  Surgeon: Doran Stabler, MD;  Location: [facility];  Service: Gastroenterology;;   LAPAROTOMY N/A 03/13/2015   Procedure: EXPLORATORY LAPAROTOMY;  Surgeon: Everitt Amber, MD;  Location: [facility];  Service: Gynecology;  Laterality: N/A;   POLYPECTOMY  07/19/2020   Procedure: POLYPECTOMY;  Surgeon: Doran Stabler, MD;  Location: [facility];  Service: Gastroenterology;;   TONSILLECTOMY AND [place]  03/13/2015   Procedure: HERNIA REPAIR VENTRAL ADULT;  Surgeon: Everitt Amber, MD;  Location: [facility];  Service: Gynecology;;    Current Medications: Current Meds  Medication Sig   acetaminophen (TYLENOL) 500 MG tablet Take 2 tablets (1,000 mg total) by mouth every 6 (six) hours as needed for moderate pain.   albuterol (PROVENTIL) (2.5 MG/3ML) 0.083% nebulizer solution Take 3 mLs (2.5 mg total) by nebulization every 6 (six) hours as needed for wheezing or shortness of breath.   albuterol (VENTOLIN HFA) 108 (90 Base) MCG/ACT inhaler Inhale 2 puffs into the lungs every 6 (six) hours as needed for wheezing or shortness of breath.   amLODipine (NORVASC) 5 MG tablet Take 1 tablet (5 mg total) by mouth daily.   B-D UF III MINI PEN NEEDLES 31G X 5 MM MISC    baclofen (LIORESAL) 10 MG tablet Take 1 tablet (10 mg total) by mouth 2 (two) times daily as needed  for muscle spasms.   benzonatate (TESSALON) 100 MG capsule Take 1 capsule (100 mg total) by mouth 3 (three) times daily as needed for cough.   buPROPion ER (WELLBUTRIN SR) 100 MG 12 hr tablet Take 100 mg by mouth 2 (two) times daily.   Cholecalciferol (VITAMIN D) 50 MCG (2000 UT) CAPS Take 8,000 Units by mouth daily.   diclofenac Sodium (VOLTAREN) 1 % GEL Apply to knee once daily as needed   fesoterodine (TOVIAZ) 8 MG TB24 tablet TAKE 1 TABLET(8 MG) BY MOUTH DAILY   fluticasone (FLONASE) 50 MCG/ACT nasal spray Place 2 sprays into both nostrils daily.   furosemide (LASIX) 20 MG tablet Take 20 mg by mouth daily as needed for edema.   guaiFENesin (MUCINEX) 600 MG 12 hr tablet Take by mouth.   Insulin Pen Needle 31G X 5 MM  MISC Use as directed with Victoza   lidocaine (LIDODERM) 5 % Place 1 patch onto the skin daily. Remove & Discard patch within 12 hours or as directed by MD   lidocaine-prilocaine (EMLA) cream Apply 1 application topically as needed (port access).   liraglutide (VICTOZA) 18 MG/3ML SOPN Inject 1.8 mg into the skin daily.   omeprazole (PRILOSEC) 40 MG capsule Take 1 capsule (40 mg total) by mouth 2 (two) times daily before a meal. **PLEASE CALL OFFICE TO SCHEDULE FOLLOW UP   promethazine-dextromethorphan (PROMETHAZINE-DM) 6.25-15 MG/5ML syrup Take 5 mLs by mouth 4 (four) times daily as needed for cough.   triamcinolone cream (KENALOG) 0.1 % Apply 1 Application topically 2 (two) times daily as needed. APPLY TO THE AFFECTED AREA TWICE DAILY AS NEEDED FOR SKIN IRRITATION   umeclidinium-vilanterol (ANORO ELLIPTA) 62.5-25 MCG/ACT AEPB Inhale 1 puff into the lungs daily.   umeclidinium-vilanterol (ANORO ELLIPTA) 62.5-25 MCG/ACT AEPB Inhale 1 puff into the lungs daily.   valACYclovir (VALTREX) 500 MG tablet TAKE 1 TABLET(500 MG) BY MOUTH TWICE DAILY FOR 3 DAYS. START OF OUTBREAK   XARELTO 20 MG TABS tablet TAKE 1 TABLET(20 MG) BY MOUTH EVERY MORNING     Allergies:   Penicillins   Social  History   Socioeconomic History   Marital status: Legally Separated    Spouse name: Not on file   Number of children: Not on file   Years of education: Not on file   Highest education level: Not on file  Occupational History   Not on file  Tobacco Use   Smoking status: Never   Smokeless tobacco: Never  Vaping Use   Vaping Use: Never used  Substance and Sexual Activity   Alcohol use: Yes    Comment: Maybe wine every 6 months   Drug use: No   Sexual activity: Yes    Partners: Male    Comment: Told she could not have more children in 1986  Other Topics Concern   Not on file  Social History Narrative   Works part time at [employer] (13 years as of 2015) - former Librarian, academic, 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   Married x 23 years, recently separated (4/15).  No domestic violence.  + financial stress.     Lives previously with husband who moved out, now with daughter who has medical problems, including Hodgkin's disease, and granddaughter (age 102).     Uses seat belt.    Social Determinants of Health   Financial Resource Strain: High Risk (06/23/2018)   Overall Financial Resource Strain (CARDIA)    Difficulty of Paying Living Expenses: Very hard  Food Insecurity: Food Insecurity Present (06/23/2018)   Hunger Vital Sign    Worried About Running Out of Food in the Last Year: Sometimes true    Ran Out of Food in the Last Year: Sometimes true  Transportation Needs: No Transportation Needs (06/23/2018)   PRAPARE - Hydrologist (Medical): No    Lack of Transportation (Non-Medical): No  Physical Activity: Not on file  Stress: No Stress Concern Present (06/23/2018)   Elim    Feeling of Stress : Only a little  Social Connections: Not on file     Family History: The patient's family history includes Asthma in her maternal aunt; Colon polyps in her mother; Diabetes in her  daughter, mother, paternal aunt, paternal aunt, paternal grandfather, and paternal uncle; Fibroids in her  mother; Hodgkin's lymphoma (age of onset: 61) in her daughter; Hyperlipidemia in her father and mother; Hypertension in her mother; Kidney failure (age of onset: 41) in her maternal grandmother; Pancreatic cancer in her paternal grandmother; Prostate cancer (age of onset: 66) in her maternal uncle. There is no history of Heart disease, Stroke, Colon cancer, Stomach cancer, Esophageal cancer, or Rectal cancer.  ROS:   Please see the history of present illness.     All other systems reviewed and are negative.  EKGs/Labs/Other Studies Reviewed:    The following studies were reviewed today:  CT coronary 01/2021: IMPRESSION: 1. Coronary calcium score of 0. This was 1st percentile for age, sex, and race matched control.   2. Normal coronary origin with right dominance.   3. Moderate pulmonary artery dilation   4. Minimal aortic valve calcification, calcium score of 8.   5. There is no evidence of significant plaque or coronary lesions; there is significant attenuation from habitus. LCx system assessed by axial MPR due to technically difficult imaging.  EKG:  EKG is  ordered today.  The ekg ordered today demonstrates sinus rhythm HR 76  Recent Labs: 12/17/2021: BNP 43.4; Hemoglobin 11.0; Platelets 208  Recent Lipid Panel    Component Value Date/Time   CHOL 225 (H) 04/26/2021 1034   TRIG 84 04/26/2021 1034   HDL 56 04/26/2021 1034   CHOLHDL 4.0 04/26/2021 1034   CHOLHDL 4.5 01/01/2018 1010   VLDL 19 01/01/2018 1010   LDLCALC 154 (H) 04/26/2021 1034   LDLDIRECT 164 (H) 07/26/2013 0933     Risk Assessment/Calculations:                Physical Exam:    VS:  BP 136/70 (BP Location: Left Arm, Patient Position: Sitting)   Pulse 76   Ht '5\' 5"'$  (1.651 m)   Wt (!) 363 lb 9.6 oz (164.9 kg)   LMP 10/16/2013 (Approximate)   SpO2 96%   BMI 60.51 kg/m     Wt Readings from  Last 3 Encounters:  01/28/22 (!) 363 lb 9.6 oz (164.9 kg)  01/15/22 (!) 363 lb 3.2 oz (164.7 kg)  01/09/22 (!) 359 lb 9.6 oz (163.1 kg)     GEN: obese female in NAD HEENT: Normal NECK: No JVD; No carotid bruits LYMPHATICS: No lymphadenopathy CARDIAC: RRR, no murmurs, rubs, gallops RESPIRATORY:  Clear to auscultation without rales, wheezing or rhonchi  ABDOMEN: Soft, non-tender, non-distended MUSCULOSKELETAL:  No edema; No deformity  SKIN: Warm and dry NEUROLOGIC:  Alert and oriented x 3 PSYCHIATRIC:  Normal affect   ASSESSMENT:    1. Essential hypertension   2. Mixed hyperlipidemia   3. History of pulmonary embolism   4. Chronic anticoagulation   5. Grade I diastolic dysfunction   6. Morbid obesity ([place])   7. Preoperative cardiovascular examination    PLAN:    In order of problems listed above:  Hypertension BP controlled, will keep a close eye on this after bariatric surgery   Hyperlipidemia with DL goal less than 100 04/26/2021: Cholesterol, Total 225; HDL 56; LDL Chol Calc (NIH) 154; Triglycerides 84 He was on Crestor but this was discontinued due to side effects. We discussed lower LDL and she is willing to try 10 mg zetia.    History of PE Chronic anticoagulation with Xarelto Managed by PCP   Grade 1 DD Echo 2021 showed preserved LVEF 60-65%, mild LVH, G1 DD. She is taking as needed Lasix, hasn't taken this is several months.  Obesity Planning bariatric surgery. If no change in symptoms, may proceed with surgery without further testing.    Follow up in 1 year.             Medication Adjustments/Labs and Tests Ordered: Current medicines are reviewed at length with the patient today.  Concerns regarding medicines are outlined above.  No orders of the defined types were placed in this encounter.  No orders of the defined types were placed in this encounter.   There are no Patient Instructions on file for this visit.   Signed, Switzer, PA  01/28/2022 2:07 PM    [place]

## 2022-01-28 ENCOUNTER — Encounter: Payer: Self-pay | Admitting: Physician Assistant

## 2022-01-28 ENCOUNTER — Ambulatory Visit: Payer: Medicare HMO | Attending: Physician Assistant | Admitting: Physician Assistant

## 2022-01-28 VITALS — BP 136/70 | HR 76 | Ht 65.0 in | Wt 363.6 lb

## 2022-01-28 DIAGNOSIS — Z01810 Encounter for preprocedural cardiovascular examination: Secondary | ICD-10-CM

## 2022-01-28 DIAGNOSIS — E782 Mixed hyperlipidemia: Secondary | ICD-10-CM

## 2022-01-28 DIAGNOSIS — I10 Essential (primary) hypertension: Secondary | ICD-10-CM | POA: Diagnosis not present

## 2022-01-28 DIAGNOSIS — Z86711 Personal history of pulmonary embolism: Secondary | ICD-10-CM

## 2022-01-28 DIAGNOSIS — Z7901 Long term (current) use of anticoagulants: Secondary | ICD-10-CM | POA: Diagnosis not present

## 2022-01-28 DIAGNOSIS — I5189 Other ill-defined heart diseases: Secondary | ICD-10-CM

## 2022-01-28 MED ORDER — EZETIMIBE 10 MG PO TABS: 10.0000 mg | ORAL_TABLET | Freq: Every day | ORAL | 3 refills | Status: DC

## 2022-01-28 NOTE — Patient Instructions (Signed)
Medication Instructions:  Start Zetia 10 mg ( Take 1 Tablet Daily). *If you need a refill on your cardiac medications before your next appointment, please call your pharmacy*   Lab Work: No Labs If you have labs (blood work) drawn today and your tests are completely normal, you will receive your results only by: [place] (if you have MyChart) OR A paper copy in the mail If you have any lab test that is abnormal or we need to change your treatment, we will call you to review the results.   Testing/Procedures: No Testing   Follow-Up: At [facility], you and your health needs are our priority.  As part of our continuing mission to provide you with exceptional heart care, we have created designated Provider Care Teams.  These Care Teams include your primary Cardiologist (physician) and Advanced Practice Providers (APPs -  Physician Assistants and Nurse Practitioners) who all work together to provide you with the care you need, when you need it.  We recommend signing up for the patient portal called "MyChart".  Sign up information is provided on this After Visit Summary.  MyChart is used to connect with patients for Virtual Visits (Telemedicine).  Patients are able to view lab/test results, encounter notes, upcoming appointments, etc.  Non-urgent messages can be sent to your provider as well.   To learn more about what you can do with MyChart, go to [email or web address].    Your next appointment:   1 year(s)  The format for your next appointment:   In Person  Provider:   Elouise Munroe, MD

## 2022-01-30 ENCOUNTER — Telehealth: Payer: Self-pay

## 2022-01-30 NOTE — Telephone Encounter (Signed)
Patient calls nurse line in regards to apt scheduled for 10/30.  Patient reports she made this apt since she was unable to get in with oncologist.   Patient reports since then she was able to schedule with oncology for 11/8.  Patient reports she recently saw PCP on 10/5 and is fine with coming in if PCP feels necessary. However, if not she will cancel apt.  Please advise.

## 2022-01-31 MED ORDER — BENZONATATE 100 MG PO CAPS: 100.0000 mg | ORAL_CAPSULE | Freq: Three times a day (TID) | ORAL | 0 refills | Status: DC | PRN

## 2022-01-31 NOTE — Telephone Encounter (Signed)
Patients apt has been cancelled.   Patient is requesting a refill on Tessalon Perles.   Will forward to PCP.

## 2022-01-31 NOTE — Telephone Encounter (Signed)
Rx for tessalon sent in Leeanne Rio, MD

## 2022-01-31 NOTE — Telephone Encounter (Signed)
I think she means she got in with her gynecologist. That's fine for her to go there for her physical - I believe she had just wanted a routine pelvic/breast exam. She can cancel with me, no problem.  Thanks Leeanne Rio, MD

## 2022-02-01 ENCOUNTER — Other Ambulatory Visit: Payer: Self-pay | Admitting: Family Medicine

## 2022-02-03 ENCOUNTER — Other Ambulatory Visit: Payer: Self-pay | Admitting: Family Medicine

## 2022-02-03 ENCOUNTER — Ambulatory Visit: Payer: Medicare HMO | Admitting: Family Medicine

## 2022-02-10 ENCOUNTER — Encounter: Payer: Self-pay | Admitting: Obstetrics & Gynecology

## 2022-02-11 NOTE — Progress Notes (Signed)
Follow Up Note: Gyn-Onc  [name] 56 y.o. female  CC: She presents for a follow-up visit   HPI: The oncology history was reviewed.  Interval History: She denies vaginal bleeding, abdominal distention, pain, cough, lethargy, weight loss or change in her bowel habits.   Review of Systems  Review of Systems  Constitutional:  Negative for malaise/fatigue and weight loss.  Respiratory:  Negative for shortness of breath and wheezing.   Cardiovascular:  Negative for chest pain and leg swelling.  Gastrointestinal:  Negative for abdominal pain, blood in stool, constipation, nausea and vomiting.  Genitourinary:  Negative for dysuria, frequency, hematuria and urgency.  Musculoskeletal:  Negative for joint pain and myalgias.  Neurological:  Negative for weakness.  Psychiatric/Behavioral:  Negative for depression. The patient does not have insomnia.    Current medications, allergy, social history, past surgical history, past medical history, family history were all reviewed.    Vitals:  BP (!) 148/78 (BP Location: Left Arm, Patient Position: Sitting)   Pulse 82   Temp 98.1 F (36.7 C) (Oral)   Resp 16   Ht 5' 5.5" (1.664 m)   Wt (!) 353 lb (160.1 kg)   LMP 10/16/2013 (Approximate)   BMI 57.85 kg/m    Physical Exam:  Physical Exam Exam conducted with a chaperone present.  Constitutional:      General: She is not in acute distress. Cardiovascular:     Rate and Rhythm: Normal rate and regular rhythm.  Pulmonary:     Effort: Pulmonary effort is normal.     Breath sounds: Normal breath sounds. No wheezing or rhonchi.  Abdominal:     Palpations: Abdomen is soft.     Tenderness: There is no abdominal tenderness. There is no right CVA tenderness or left CVA tenderness.     Hernia: No hernia is present.  Genitourinary:    General: Normal vulva.     Urethra: No urethral lesion.     Vagina: No lesions. No bleeding Musculoskeletal:     Cervical back: Neck supple.     Right  lower leg: No edema.     Left lower leg: No edema.  Lymphadenopathy:     Upper Body:     Right upper body: No supraclavicular adenopathy.     Left upper body: No supraclavicular adenopathy.     Lower Body: No right inguinal adenopathy. No left inguinal adenopathy.  Skin:    Findings: No rash.  Neurological:     Mental Status: She is oriented to person, place, and time.   Assessment/Plan: Ovarian carcinosarcoma, right (HCC) 56 y.o.  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  She is s/p exploratory laparotomy and TAH, BSO, optimal cytoreduction to no gross residual visible disease on 03/13/15. S/p 6 cycles of adjuvant carboplatin and paclitaxel chemotherapy completed 08/02/15. Genetic testing BRCA negative. Complete clinical response (no evidence of disease).  Negative symptom review, normal exam, normal tumor markers.  No evidence of recurrence   >Continue annual surveillance; f/u w/a generalist is appropriate >Instructions provided re: care of the biopsy site >Referral--IR for portacath removal >Return prn   I personally spent 30 minutes face-to-face and non-face-to-face in the care of this patient, which includes all pre, intra, and post visit time on the date of service.   Antionette Char, MD

## 2022-02-11 NOTE — Assessment & Plan Note (Signed)
57 y.o.  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  She is s/p exploratory laparotomy and TAH, BSO, optimal cytoreduction to no gross residual visible disease on 03/13/15. S/p 6 cycles of adjuvant carboplatin and paclitaxel chemotherapy completed 08/02/15. Genetic testing BRCA negative. Complete clinical response (no evidence of disease).  Negative symptom review, normal exam.  No evidence of recurrence   >Continue annual surveillance; f/u w/a generalist is appropriate

## 2022-02-12 ENCOUNTER — Inpatient Hospital Stay: Payer: Medicare HMO | Attending: Obstetrics & Gynecology | Admitting: Obstetrics & Gynecology

## 2022-02-12 ENCOUNTER — Encounter: Payer: Self-pay | Admitting: Obstetrics & Gynecology

## 2022-02-12 ENCOUNTER — Inpatient Hospital Stay: Payer: Medicare HMO

## 2022-02-12 VITALS — BP 148/78 | HR 82 | Temp 98.1°F | Resp 16 | Ht 65.5 in | Wt 353.0 lb

## 2022-02-12 DIAGNOSIS — C561 Malignant neoplasm of right ovary: Secondary | ICD-10-CM

## 2022-02-12 DIAGNOSIS — Z90722 Acquired absence of ovaries, bilateral: Secondary | ICD-10-CM | POA: Insufficient documentation

## 2022-02-12 DIAGNOSIS — C541 Malignant neoplasm of endometrium: Secondary | ICD-10-CM

## 2022-02-12 DIAGNOSIS — Z90710 Acquired absence of both cervix and uterus: Secondary | ICD-10-CM | POA: Insufficient documentation

## 2022-02-12 DIAGNOSIS — Z8543 Personal history of malignant neoplasm of ovary: Secondary | ICD-10-CM | POA: Diagnosis present

## 2022-02-12 NOTE — Patient Instructions (Signed)
Return prn or in 1 year 

## 2022-02-13 LAB — CA 125: Cancer Antigen (CA) 125: 3.3 U/mL (ref 0.0–38.1)

## 2022-02-14 ENCOUNTER — Other Ambulatory Visit: Payer: Self-pay | Admitting: Gynecologic Oncology

## 2022-02-14 DIAGNOSIS — C541 Malignant neoplasm of endometrium: Secondary | ICD-10-CM

## 2022-02-14 DIAGNOSIS — Z95828 Presence of other vascular implants and grafts: Secondary | ICD-10-CM

## 2022-02-14 DIAGNOSIS — C561 Malignant neoplasm of right ovary: Secondary | ICD-10-CM

## 2022-02-14 NOTE — Progress Notes (Signed)
Order placed for port removal per Dr. Merrilee Jansky.

## 2022-02-22 ENCOUNTER — Other Ambulatory Visit: Payer: Self-pay | Admitting: Internal Medicine

## 2022-03-03 ENCOUNTER — Other Ambulatory Visit: Payer: Self-pay | Admitting: Family Medicine

## 2022-03-03 ENCOUNTER — Other Ambulatory Visit: Payer: Self-pay | Admitting: Nurse Practitioner

## 2022-03-04 ENCOUNTER — Telehealth: Payer: Self-pay | Admitting: Internal Medicine

## 2022-03-04 NOTE — Telephone Encounter (Signed)
*  STAT* If patient is at the pharmacy, call can be transferred to refill team.   1. Which medications need to be refilled? (please list name of each medication and dose if known)  amLODipine (NORVASC) 5 MG tablet   2. Which pharmacy/location (including street and city if local pharmacy) is medication to be sent to? [place], [place]   3. Do they need a 30 day or 90 day supply? 90 day  Patient has 1 pill left.

## 2022-03-05 MED ORDER — AMLODIPINE BESYLATE 5 MG PO TABS: 5.0000 mg | ORAL_TABLET | Freq: Every day | ORAL | 3 refills | Status: DC

## 2022-04-03 ENCOUNTER — Other Ambulatory Visit: Payer: Self-pay | Admitting: Family Medicine

## 2022-04-15 ENCOUNTER — Other Ambulatory Visit: Payer: Self-pay | Admitting: Family Medicine

## 2022-04-15 DIAGNOSIS — M545 Low back pain, unspecified: Secondary | ICD-10-CM

## 2022-04-17 ENCOUNTER — Other Ambulatory Visit: Payer: Self-pay | Admitting: Family Medicine

## 2022-04-17 DIAGNOSIS — Z1231 Encounter for screening mammogram for malignant neoplasm of breast: Secondary | ICD-10-CM

## 2022-04-20 ENCOUNTER — Other Ambulatory Visit: Payer: Self-pay | Admitting: Family Medicine

## 2022-05-01 ENCOUNTER — Other Ambulatory Visit: Payer: Self-pay | Admitting: Family Medicine

## 2022-05-08 ENCOUNTER — Ambulatory Visit: Payer: Medicare HMO

## 2022-05-09 ENCOUNTER — Ambulatory Visit
Admission: RE | Admit: 2022-05-09 | Discharge: 2022-05-09 | Disposition: A | Discharge status: Home / Self Care | Payer: Medicare HMO | Source: Ambulatory Visit | Attending: Family Medicine | Admitting: Family Medicine

## 2022-05-09 DIAGNOSIS — Z1231 Encounter for screening mammogram for malignant neoplasm of breast: Secondary | ICD-10-CM

## 2022-05-12 ENCOUNTER — Ambulatory Visit: Payer: Medicare HMO | Admitting: Pulmonary Disease

## 2022-05-16 ENCOUNTER — Other Ambulatory Visit: Payer: Self-pay | Admitting: Family Medicine

## 2022-05-21 ENCOUNTER — Other Ambulatory Visit: Payer: Self-pay | Admitting: Student

## 2022-05-21 DIAGNOSIS — J0101 Acute recurrent maxillary sinusitis: Secondary | ICD-10-CM

## 2022-05-26 ENCOUNTER — Ambulatory Visit (INDEPENDENT_AMBULATORY_CARE_PROVIDER_SITE_OTHER): Payer: Medicare HMO | Admitting: Family Medicine

## 2022-05-26 VITALS — BP 140/75 | HR 68 | Ht 65.0 in | Wt 365.5 lb

## 2022-05-26 DIAGNOSIS — M545 Low back pain, unspecified: Secondary | ICD-10-CM

## 2022-05-26 DIAGNOSIS — M25561 Pain in right knee: Secondary | ICD-10-CM | POA: Diagnosis not present

## 2022-05-26 MED ORDER — DICLOFENAC SODIUM 1 % EX GEL: CUTANEOUS | 1 refills | Status: DC

## 2022-05-26 NOTE — Assessment & Plan Note (Signed)
-  likely secondary worsening osteoarthritis, encouraged exercises and continued participation in aerobic exercises. Other differentials considered include gout and worsening neuropathy -sciatica handout provided on rehab exercises -no indication for imaging at this time -volatren gel refills provided -PT referral placed  -follow up in 1-2 weeks

## 2022-05-26 NOTE — Patient Instructions (Addendum)
It was great seeing you today!  Today we discussed your pain, I believe this may be due to osteoarthritis and sciatic pain as we talked about. Stretching exercises are going to be the best treatment for this. Continue water aerobics. I have provided refills on volatren gel that you may apply. I have also placed a referral to physical therapy, they should be in contact with you within the next 1-2 weeks.  Please follow up at your next scheduled appointment, if anything arises between now and then, please don't hesitate to contact our office.   Thank you for allowing [place] to be a part of your medical care!  Thank you, Dr. Larae Grooms  Also a reminder of our clinic's no-show policy. Please make sure to arrive at least 15 minutes prior to your scheduled appointment time. Please try to cancel before 24 hours if you are not able to make it. If you no-show for 2 appointments then you will be receiving a warning letter. If you no-show after 3 visits, then you may be at risk of being dismissed from our clinic. This is to ensure that everyone is able to be seen in a timely manner. Thank you, we appreciate your assistance with this!

## 2022-05-26 NOTE — Progress Notes (Signed)
    SUBJECTIVE:   CHIEF COMPLAINT / HPI:   Patient presents with right knee pain for the last 3 weeks ago, her father passed away and so she did not have time to be seen in clinic. Also having right hip pain, pain feels like it is radiating down her leg. Sometimes feels like her leg gives out, used to use a cane occasionally and now uses it almost all the time. Has had knee pain in the past and this is usually better with volatren gel which was working at the time but not working anymore. No trauma or injury. She believes that since September, her father was diagnosed with cancer and she has been on the move a lot so thinks this could be contributing. Believes that now that she is able to rest more, she notices the pain more. Denies any numbness or tingling. Aching sensation sometimes goes into her feet, was on gabapentin but discussed with PCP that she wanted to stop it since she did not like how it was making her feel. Joined the aquatic center which makes it better.   OBJECTIVE:   BP (!) 140/75   Pulse 68   Ht 5' 5"$  (1.651 m)   Wt (!) 365 lb 8 oz (165.8 kg)   LMP 10/16/2013 (Approximate)   SpO2 97%   BMI 60.82 kg/m   General: Patient well-appearing, in no acute distress. CV: RRR, no murmurs or gallops auscultated Resp: CTAB, no wheezing, rales or rhonchi noted MSK: no gross deformity noted, no erythema or edema noted along knees bilaterally, mild tenderness noted upon deep palpation of anterior right patella, no crepitus noted, normal active ROM along both knees, negative anterior drawer and Lachman testing, positive right straight leg raise  Neuro: 5/5 LE strength bilaterally, gross sensation intact, slightly antalgic gait without cane, ambulates with cane otherwise without pain elicited   ASSESSMENT/PLAN:   Right knee pain -likely secondary worsening osteoarthritis, encouraged exercises and continued participation in aerobic exercises. Other differentials considered include gout and  worsening neuropathy -sciatica handout provided on rehab exercises -no indication for imaging at this time -volatren gel refills provided -PT referral placed  -follow up in 1-2 weeks   -PHQ-9 score of 0.  Donney Dice, [place]

## 2022-05-30 ENCOUNTER — Encounter (HOSPITAL_BASED_OUTPATIENT_CLINIC_OR_DEPARTMENT_OTHER): Payer: Self-pay

## 2022-05-30 ENCOUNTER — Emergency Department (HOSPITAL_BASED_OUTPATIENT_CLINIC_OR_DEPARTMENT_OTHER)
Admission: EM | Admit: 2022-05-30 | Discharge: 2022-05-30 | Disposition: A | Discharge status: Home / Self Care | Payer: Medicare HMO | Attending: Emergency Medicine | Admitting: Emergency Medicine

## 2022-05-30 ENCOUNTER — Other Ambulatory Visit: Payer: Self-pay

## 2022-05-30 ENCOUNTER — Emergency Department (HOSPITAL_BASED_OUTPATIENT_CLINIC_OR_DEPARTMENT_OTHER): Payer: Medicare HMO | Admitting: Radiology

## 2022-05-30 DIAGNOSIS — S99921A Unspecified injury of right foot, initial encounter: Secondary | ICD-10-CM | POA: Diagnosis present

## 2022-05-30 DIAGNOSIS — S91331A Puncture wound without foreign body, right foot, initial encounter: Secondary | ICD-10-CM | POA: Diagnosis not present

## 2022-05-30 DIAGNOSIS — Z23 Encounter for immunization: Secondary | ICD-10-CM | POA: Insufficient documentation

## 2022-05-30 DIAGNOSIS — Z7901 Long term (current) use of anticoagulants: Secondary | ICD-10-CM | POA: Diagnosis not present

## 2022-05-30 DIAGNOSIS — W450XXA Nail entering through skin, initial encounter: Secondary | ICD-10-CM | POA: Insufficient documentation

## 2022-05-30 DIAGNOSIS — M79671 Pain in right foot: Secondary | ICD-10-CM

## 2022-05-30 MED ORDER — TETANUS-DIPHTH-ACELL PERTUSSIS 5-2.5-18.5 LF-MCG/0.5 IM SUSY
0.5000 mL | PREFILLED_SYRINGE | Freq: Once | INTRAMUSCULAR | Status: AC
  Administered 2022-05-30: 0.5 mL via INTRAMUSCULAR
  Filled 2022-05-30: qty 0.5

## 2022-05-30 MED ORDER — CIPROFLOXACIN HCL 500 MG PO TABS
500.0000 mg | ORAL_TABLET | Freq: Once | ORAL | Status: AC
  Administered 2022-05-30: 500 mg via ORAL
  Filled 2022-05-30: qty 1

## 2022-05-30 MED ORDER — CIPROFLOXACIN HCL 500 MG PO TABS: 500.0000 mg | ORAL_TABLET | Freq: Two times a day (BID) | ORAL | 0 refills | Status: DC

## 2022-05-30 NOTE — ED Provider Notes (Signed)
[facility] Provider Note   CSN: [id number] Arrival date & time: 05/30/22  1659     History  Chief Complaint  Patient presents with   Foot Injury    [name] is a 57 y.o. female.   Foot Injury    57 yo woman presents to the ED today after stepping on a nail with her right foot. There was a puncture wound and she did remove the nail herself. She states she has not had her tetanus shot within the last ten years. She complains of right foot pain, but is able to ambulate. Denies SOB, CP, weakness, muscle rigidity, or muscle spasms.   Home Medications Prior to Admission medications   Medication Sig Start Date End Date Taking? Authorizing Provider  ciprofloxacin (CIPRO) 500 MG tablet Take 1 tablet (500 mg total) by mouth every 12 (twelve) hours. 05/30/22  Yes Sherrill Raring, PA-C  acetaminophen (TYLENOL) 500 MG tablet Take 2 tablets (1,000 mg total) by mouth every 6 (six) hours as needed for moderate pain. 03/14/21   Alen Bleacher, MD  albuterol (PROVENTIL) (2.5 MG/3ML) 0.083% nebulizer solution Take 3 mLs (2.5 mg total) by nebulization every 6 (six) hours as needed for wheezing or shortness of breath. 12/23/21   Leeanne Rio, MD  albuterol (VENTOLIN HFA) 108 (90 Base) MCG/ACT inhaler INHALE 2 PUFFS INTO THE LUNGS EVERY 6 HOURS AS NEEDED FOR WHEEZING OR SHORTNESS OF BREATH 05/16/22   Leeanne Rio, MD  amLODipine (NORVASC) 5 MG tablet Take 1 tablet (5 mg total) by mouth daily. 03/05/22   Elouise Munroe, MD  B-D UF III MINI PEN NEEDLES 31G X 5 MM MISC  07/28/19   [provider]  baclofen (LIORESAL) 10 MG tablet TAKE 1 TABLET(10 MG) BY MOUTH TWICE DAILY AS NEEDED FOR MUSCLE SPASMS 04/18/22   Leeanne Rio, MD  benzonatate (TESSALON) 100 MG capsule TAKE 1 CAPSULE(100 MG) BY MOUTH THREE TIMES DAILY AS NEEDED FOR COUGH 04/18/22   Leeanne Rio, MD  buPROPion ER [facility] SR) 100 MG 12 hr tablet Take 100 mg  by mouth 2 (two) times daily. 12/09/21   [provider]  Cholecalciferol (VITAMIN D) 50 MCG (2000 UT) CAPS Take 8,000 Units by mouth daily.    [provider]  diclofenac Sodium (VOLTAREN) 1 % GEL Apply to knee once daily as needed 05/26/22   Ganta, Anupa, DO  ezetimibe (ZETIA) 10 MG tablet Take 1 tablet (10 mg total) by mouth daily. 01/28/22 04/28/22  Ledora Bottcher, PA  fesoterodine (TOVIAZ) 8 MG TB24 tablet TAKE 1 TABLET(8 MG) BY MOUTH DAILY 04/22/22   Leeanne Rio, MD  fluticasone [facility]) 50 MCG/ACT nasal spray SHAKE LIQUID AND USE 2 SPRAYS IN [facility] NOSTRIL DAILY 05/21/22   Leeanne Rio, MD  furosemide (LASIX) 20 MG tablet Take 20 mg by mouth daily as needed for edema.    [provider]  guaiFENesin (MUCINEX) 600 MG 12 hr tablet Take by mouth. 02/21/19   [provider]  Insulin Pen Needle 31G X 5 MM MISC Use as directed with Victoza 09/16/19   [provider]  lidocaine (LIDODERM) 5 % Place 1 patch onto the skin daily. Remove & Discard patch within 12 hours or as directed by MD 03/14/21   Alen Bleacher, MD  lidocaine-prilocaine (EMLA) cream Apply 1 application topically as needed (port access).    [provider]  liraglutide (VICTOZA) 18 MG/3ML SOPN Inject 1.8 mg  into the skin daily. 07/21/20   Doreatha Lew, MD  metoprolol tartrate (LOPRESSOR) 50 MG tablet Take 1 tablet (50 mg total) by mouth once for 1 dose. PLEASE TAKE METOPROLOL 2  HOURS PRIOR TO CTA SCAN. 01/18/21 02/10/22  Elouise Munroe, MD  naltrexone (DEPADE) 50 MG tablet Take 25 mg by mouth daily. 09/09/21   [provider]  omeprazole (PRILOSEC) 40 MG capsule Take 1 capsule (40 mg total) by mouth in the morning and at bedtime. 03/03/22   Noralyn Pick, NP  promethazine-dextromethorphan (PROMETHAZINE-DM) 6.25-15 MG/5ML syrup Take 5 mLs by mouth 4 (four) times daily as needed for cough. 11/12/21   White, Leitha Schuller, NP  triamcinolone cream  (KENALOG) 0.1 % Apply 1 Application topically 2 (two) times daily as needed. APPLY TO THE AFFECTED AREA TWICE DAILY AS NEEDED FOR SKIN IRRITATION 01/09/22   Leeanne Rio, MD  umeclidinium-vilanterol [facility] ELLIPTA) 62.5-25 MCG/ACT AEPB Inhale 1 puff into the lungs daily. 01/15/22   Hunsucker, Bonna Gains, MD  valACYclovir (VALTREX) 500 MG tablet TAKE 1 TABLET(500 MG) BY MOUTH TWICE DAILY FOR 3 DAYS. START OF OUTBREAK 05/01/22   Leeanne Rio, MD  XARELTO 20 MG TABS tablet TAKE 1 TABLET(20 MG) BY MOUTH EVERY MORNING 05/16/22   Leeanne Rio, MD      Allergies    Penicillins    Review of Systems   Review of Systems  Physical Exam Updated Vital Signs BP (!) 154/80 (BP Location: Right Arm)   Pulse 80   Temp (!) 96.2 F (35.7 C) (Temporal)   Resp 18   Ht '5\' 6"'$  (1.676 m)   Wt (!) 165.1 kg   LMP 10/16/2013 (Approximate)   SpO2 100%   BMI 58.75 kg/m  Physical Exam Vitals and nursing note reviewed. Exam conducted with a chaperone present.  Constitutional:      General: She is not in acute distress.    Appearance: Normal appearance.  HENT:     Head: Normocephalic and atraumatic.  Eyes:     General: No scleral icterus.    Extraocular Movements: Extraocular movements intact.     Pupils: Pupils are equal, round, and reactive to light.  Cardiovascular:     Pulses: Normal pulses.  Skin:    Capillary Refill: Capillary refill takes less than 2 seconds.     Coloration: Skin is not jaundiced.     Comments: Puncture wound dorsum right foot, no surrounding erythema or fluctuance  Neurological:     Mental Status: She is alert. Mental status is at baseline.     Coordination: Coordination normal.     ED Results / Procedures / Treatments   Labs (all labs ordered are listed, but only abnormal results are displayed) Labs Reviewed - No data to display  EKG None  Radiology DG Foot Complete Right  Result Date: 05/30/2022 CLINICAL DATA:  Stepped on a Roofing Nail  approximately 1 Hour ago. Managed to remove nail at Home with family member assistance. Does take Anticoagulants. EXAM: RIGHT FOOT COMPLETE - 3+ VIEW COMPARISON:  None available FINDINGS: No fracture or dislocation. Soft tissues are unremarkable. IMPRESSION: No acute abnormality of the right foot. No retained radiopaque foreign body. Electronically Signed   By: Miachel Roux M.D.   On: 05/30/2022 17:36    Procedures Procedures    Medications Ordered in ED Medications  Tdap (BOOSTRIX) injection 0.5 mL (0.5 mLs Intramuscular Given 05/30/22 1759)  ciprofloxacin (CIPRO) tablet 500 mg (500 mg Oral Given 05/30/22 1759)  ED Course/ Medical Decision Making/ A&P                             Medical Decision Making Amount and/or Complexity of Data Reviewed Radiology: ordered.  Risk Prescription drug management.   Patient is neuro vas intact with brisk cap refill, DP and PT are 2+.  She has a puncture wound to the dorsum of her no palpable foreign body.  X-rays ordered without any radiopaque foreign bodies appreciated.  No bony abnormalities or fractures.  Tetanus updated, will discharge with ciprofloxacin for pseudomonal coverage.  Return precautions discussed, stable for outpatient follow-up with PCP.        Final Clinical Impression(s) / ED Diagnoses Final diagnoses:  Right foot pain    Rx / DC Orders ED Discharge Orders          Ordered    ciprofloxacin (CIPRO) 500 MG tablet  Every 12 hours        05/30/22 1755              Sherrill Raring, PA-C 05/30/22 1806    Tegeler, Gwenyth Allegra, MD 05/30/22 [phone]

## 2022-05-30 NOTE — ED Triage Notes (Signed)
Patient here POV from Home.  Endorses stepping on a Roofing Nail approximately 1 Hour ago. Managed to remove nail at Home with family member assistance. Does take Anticoagulants. Unknown Tetanus.  NAD Noted during Triage. A&Ox4. GCS 15. BIB Wheelchair.

## 2022-05-30 NOTE — Discharge Instructions (Signed)
Take the ciprofloxacin twice daily for the next 5 days.  You can Tylenol for pain or use Voltaren gel.  Your tetanus was updated today and should be covered for the next 7 to 10 years.  Follow-up with your primary this week for reevaluation, return to the ED if fevers, spreading redness, fevers, signs of other infection.

## 2022-05-30 NOTE — ED Notes (Signed)
DC papers reviewed. No questions or concerns. No signs of distress. Pt assisted to wheelchair and out to lobby. Appropriate measures for safety taken. 

## 2022-06-05 ENCOUNTER — Ambulatory Visit (INDEPENDENT_AMBULATORY_CARE_PROVIDER_SITE_OTHER): Payer: Medicare HMO | Admitting: Pulmonary Disease

## 2022-06-05 ENCOUNTER — Encounter: Payer: Self-pay | Admitting: Pulmonary Disease

## 2022-06-05 VITALS — BP 142/90 | HR 83 | Temp 98.0°F | Ht 65.5 in | Wt 352.0 lb

## 2022-06-05 DIAGNOSIS — G4733 Obstructive sleep apnea (adult) (pediatric): Secondary | ICD-10-CM

## 2022-06-05 MED ORDER — STIOLTO RESPIMAT 2.5-2.5 MCG/ACT IN AERS: 2.0000 | INHALATION_SPRAY | Freq: Every day | RESPIRATORY_TRACT | 11 refills | Status: DC

## 2022-06-05 NOTE — Progress Notes (Signed)
$'@Patient'F$  ID: [name], female    DOB: 20-Jun-1965, 57 y.o.   MRN: [id number]  Chief Complaint  Patient presents with   Follow-up    Pt wants to discuss port. She wants it to be removed.  Pt has been using inhaler and neb more frequently     Referring provider: Leeanne Rio, MD  HPI:   57 y.o. woman whom we are seeing in for in follow up of recurrent pneumonia.  Most recent PCP note  reviewed.  Most recent oncology note reviewed.  Doing okay.  Used a sample of Anoro at last visit.  Did not think this helped today with breathing.  I was never informed so prescription was never sent.  We discussed use in a similar manner medicine based on insurance formally which she is eager to try.  We discussed at length the primary problem being likely her hiatal hernia.  Suspect this is where she has had recurrent pneumonias, aspiration etc.  She agrees.  She  asked about pursuing a test for sleep apnea.  She like to be proactive with her health.  She was congratulated on ongoing efforts at losing weight.  Order for portable was placed by oncology team.  This was delayed due to father's illness.   HPI at initial visit: Patient reports history of recurrent pneumonia.  For started after diagnosis of concomitant ovarian and endometrial cancer.  She received chemotherapy for this.  Also had hysterectomy.  Last chemotherapy dose about 6 years ago.  Notably, port still in place.  Ever since then she has gotten pneumonias at least once a year.  Treated with antibiotics and usually symptoms improved with course of weeks.  She reports recent aspiration pneumonia, diagnosed earlier in 2023.  Treated with antibiotics improved.  She had recurrent pneumonia 11/2021 on chest x-ray, my review and interpretation reveals nodular opacities bilaterally, right greater than left with more confluent densities right lower lung field.  Symptoms were preceded by sinus infection.  She received antibiotics for this.   Developed pneumonia in spite of this.  Required 2 additional courses of antibiotics.  Repeat chest x-ray 12/2021 my review interpretation reveals similar distribution of nodular opacities bilaterally right greater than left with interval decrease in size and decrease confluence in the right lower lung field indicative of improving infectious pneumonia.  She had the Streptococcus immunization last year.  She reports he has a hiatal hernia.  She reports significant reflux symptoms.  On a PPI which helps with the burning but bad reflux.  She wakes up on a nightly basis with coughing.  PMH: Ovarian and endometrial cancer in remission, GERD, childhood asthma, history of recurrent PE on chronic Xarelto Surgical history: Hysterectomy, bowel resection, C-section, ventral hernia repair, tonsillectomy Family history: Mother with diabetes, hypertension, hyperlipidemia, father with hyperlipidemia Social history: Never smoker, lives in [place] / Pulmonary Flowsheets:   ACT:      No data to display           MMRC:     No data to display           Epworth:      No data to display           Tests:   FENO:  No results found for: "NITRICOXIDE"  PFT:     No data to display           WALK:      No data to display  Imaging: Personally reviewed and as per EMR and discussion in this note DG Foot Complete Right  Result Date: 05/30/2022 CLINICAL DATA:  Stepped on a Roofing Nail approximately 1 Hour ago. Managed to remove nail at Home with family member assistance. Does take Anticoagulants. EXAM: RIGHT FOOT COMPLETE - 3+ VIEW COMPARISON:  None available FINDINGS: No fracture or dislocation. Soft tissues are unremarkable. IMPRESSION: No acute abnormality of the right foot. No retained radiopaque foreign body. Electronically Signed   By: Miachel Roux M.D.   On: 05/30/2022 17:36   MM 3D SCREEN BREAST BILATERAL  Result Date: 05/12/2022 CLINICAL DATA:   Screening. EXAM: DIGITAL SCREENING BILATERAL MAMMOGRAM WITH TOMOSYNTHESIS AND CAD TECHNIQUE: Bilateral screening digital craniocaudal and mediolateral oblique mammograms were obtained. Bilateral screening digital breast tomosynthesis was performed. The images were evaluated with computer-aided detection. COMPARISON:  Previous exam(s). ACR Breast Density Category b: There are scattered areas of fibroglandular density. FINDINGS: There are no findings suspicious for malignancy. IMPRESSION: No mammographic evidence of malignancy. A result letter of this screening mammogram will be mailed directly to the patient. RECOMMENDATION: Screening mammogram in one year. (Code:SM-B-01Y) BI-RADS CATEGORY  1: Negative. Electronically Signed   By: Lovey Newcomer M.D.   On: 05/12/2022 11:05    Lab Results: Personally reviewed CBC    Component Value Date/Time   WBC 10.6 12/17/2021 1447   WBC 8.5 12/19/2020 1424   RBC 3.89 12/17/2021 1447   RBC 4.33 12/19/2020 1424   HGB 11.0 (L) 12/17/2021 1447   HGB 11.6 01/17/2016 0928   HCT 33.4 (L) 12/17/2021 1447   HCT 35.2 01/17/2016 0928   PLT 208 12/17/2021 1447   MCV 86 12/17/2021 1447   MCV 93.1 01/17/2016 0928   MCH 28.3 12/17/2021 1447   MCH 29.4 07/21/2020 0411   MCHC 32.9 12/17/2021 1447   MCHC 32.2 12/19/2020 1424   RDW 14.0 12/17/2021 1447   RDW 14.0 01/17/2016 0928   LYMPHSABS 1.8 12/17/2021 1447   LYMPHSABS 1.9 01/17/2016 0928   MONOABS 0.6 11/26/2020 1108   MONOABS 0.5 01/17/2016 0928   EOSABS 0.1 12/17/2021 1447   BASOSABS 0.0 12/17/2021 1447   BASOSABS 0.0 01/17/2016 0928    BMET    Component Value Date/Time   NA 141 01/22/2021 1022   NA 140 01/17/2016 0928   K 4.5 01/22/2021 1022   K 4.8 01/17/2016 0928   CL 103 01/22/2021 1022   CO2 25 01/22/2021 1022   CO2 22 01/17/2016 0928   GLUCOSE 81 01/22/2021 1022   GLUCOSE 95 12/11/2020 1050   GLUCOSE 103 01/17/2016 0928   BUN 20 01/22/2021 1022   BUN 28.7 (H) 01/17/2016 0928   CREATININE 0.98  01/22/2021 1022   CREATININE 1.1 01/17/2016 0928   CALCIUM 9.6 01/22/2021 1022   CALCIUM 10.0 01/17/2016 0928   GFRNONAA 52 (L) 07/21/2020 0411   GFRNONAA 77 02/26/2015 0945   GFRAA 59 (L) 07/20/2019 1004   GFRAA 89 02/26/2015 0945    BNP    Component Value Date/Time   BNP 43.4 12/17/2021 1447   BNP 181.4 (H) 07/19/2020 2253    ProBNP No results found for: "PROBNP"  Specialty Problems       Pulmonary Problems   OSA (obstructive sleep apnea)   Bronchospasm   Acute recurrent maxillary sinusitis   Multifocal pneumonia    Allergies  Allergen Reactions   Penicillins Rash    Has patient had a PCN reaction causing immediate rash, facial/tongue/throat swelling, SOB or lightheadedness with hypotension: no Has  patient had a PCN reaction causing severe rash involving mucus membranes or skin necrosis: no Has patient had a PCN reaction that required hospitalization in the hospital at the time Has patient had a PCN reaction occurring within the last 10 years: no If all of the above answers are "NO", then may proceed with Cephalosporin use.     Immunization History  Administered Date(s) Administered   Influenza Split 06/10/2011   Influenza,inj,Quad PF,6+ Mos 12/12/2014, 01/17/2016, 12/04/2016, 01/14/2018, 02/01/2019, 01/05/2020, 12/20/2020, 01/09/2022   Moderna Sars-Covid-2 Vaccination 06/24/2019, 07/25/2019   PFIZER(Purple Top)SARS-COV-2 Vaccination 03/15/2020   PNEUMOCOCCAL CONJUGATE-20 12/20/2020   PPD Test 02/02/2019   Td 07/11/2008   Tdap 05/30/2022    Past Medical History:  Diagnosis Date   Allergy    Anemia    Anxiety    Arthritis    Blood transfusion without reported diagnosis    Cough    Cough 06/11/2018   Difficulty sleeping 06/11/2018   Diverticulitis with perforation 2010   DVT (deep vein thrombosis) in pregnancy 11/24/2016   DVT, lower extremity ([place])    Endometrial cancer ([place])    Enlarged lymph nodes 08/13/2017   Family history of adverse reaction to  anesthesia    pts daughter had difficulty awakening following anesthesia, long time to wake up   GERD (gastroesophageal reflux disease)    Headache    Hemoptysis 04/22/2018   History of bronchitis    History of ear infections    Hospital discharge follow-up 03/02/2019   Hx of migraines    Hyperlipidemia    Hypertension    IBS (irritable bowel syndrome)    Kidney infection    Lupus (systemic lupus erythematosus) ([place]) 02/2017   Morbid obesity ([place])    Neck pain 01/13/2019   Ovarian cancer ([place]) dx'd 01/2015   PONV (postoperative nausea and vomiting)    Port-A-Cath in place    Right side   Portacath in place 09/12/2015   Pre-diabetes    Pyelonephritis    Right knee pain 11/28/2011   Scleroderma ([place])    Screen for colon cancer 03/02/2019   Slow rate of speech 08/22/2015   Chronic from CVA.  She also has loss of left nasolabial fold and slight left facial droop/small asymmetry on the left side secondary to CVA   UTI (urinary tract infection) 05/07/2018    Tobacco History: Social History   Tobacco Use  Smoking Status Never  Smokeless Tobacco Never   Counseling given: Not Answered   Continue to not smoke  Outpatient Encounter Medications as of 06/05/2022  Medication Sig   acetaminophen (TYLENOL) 500 MG tablet Take 2 tablets (1,000 mg total) by mouth every 6 (six) hours as needed for moderate pain.   albuterol (PROVENTIL) (2.5 MG/3ML) 0.083% nebulizer solution Take 3 mLs (2.5 mg total) by nebulization every 6 (six) hours as needed for wheezing or shortness of breath.   albuterol (VENTOLIN HFA) 108 (90 Base) MCG/ACT inhaler INHALE 2 PUFFS INTO THE LUNGS EVERY 6 HOURS AS NEEDED FOR WHEEZING OR SHORTNESS OF BREATH   amLODipine (NORVASC) 5 MG tablet Take 1 tablet (5 mg total) by mouth daily.   B-D UF III MINI PEN NEEDLES 31G X 5 MM MISC    baclofen (LIORESAL) 10 MG tablet TAKE 1 TABLET(10 MG) BY MOUTH TWICE DAILY AS NEEDED FOR MUSCLE SPASMS   benzonatate (TESSALON) 100 MG capsule  TAKE 1 CAPSULE(100 MG) BY MOUTH THREE TIMES DAILY AS NEEDED FOR COUGH   buPROPion ER (WELLBUTRIN SR) 100 MG 12 hr  tablet Take 100 mg by mouth 2 (two) times daily.   Cholecalciferol (VITAMIN D) 50 MCG (2000 UT) CAPS Take 8,000 Units by mouth daily.   ciprofloxacin (CIPRO) 500 MG tablet Take 1 tablet (500 mg total) by mouth every 12 (twelve) hours.   diclofenac Sodium (VOLTAREN) 1 % GEL Apply to knee once daily as needed   fesoterodine (TOVIAZ) 8 MG TB24 tablet TAKE 1 TABLET(8 MG) BY MOUTH DAILY   fluticasone (FLONASE) 50 MCG/ACT nasal spray SHAKE LIQUID AND USE 2 SPRAYS IN EACH NOSTRIL DAILY   furosemide (LASIX) 20 MG tablet Take 20 mg by mouth daily as needed for edema.   guaiFENesin (MUCINEX) 600 MG 12 hr tablet Take by mouth.   Insulin Pen Needle 31G X 5 MM MISC Use as directed with Victoza   lidocaine (LIDODERM) 5 % Place 1 patch onto the skin daily. Remove & Discard patch within 12 hours or as directed by MD   lidocaine-prilocaine (EMLA) cream Apply 1 application topically as needed (port access).   liraglutide (VICTOZA) 18 MG/3ML SOPN Inject 1.8 mg into the skin daily.   naltrexone (DEPADE) 50 MG tablet Take 25 mg by mouth daily.   omeprazole (PRILOSEC) 40 MG capsule Take 1 capsule (40 mg total) by mouth in the morning and at bedtime.   promethazine-dextromethorphan (PROMETHAZINE-DM) 6.25-15 MG/5ML syrup Take 5 mLs by mouth 4 (four) times daily as needed for cough.   Tiotropium Bromide-Olodaterol (STIOLTO RESPIMAT) 2.5-2.5 MCG/ACT AERS Inhale 2 puffs into the lungs daily.   triamcinolone cream (KENALOG) 0.1 % Apply 1 Application topically 2 (two) times daily as needed. APPLY TO THE AFFECTED AREA TWICE DAILY AS NEEDED FOR SKIN IRRITATION   valACYclovir (VALTREX) 500 MG tablet TAKE 1 TABLET(500 MG) BY MOUTH TWICE DAILY FOR 3 DAYS. START OF OUTBREAK   XARELTO 20 MG TABS tablet TAKE 1 TABLET(20 MG) BY MOUTH EVERY MORNING   [DISCONTINUED] umeclidinium-vilanterol (ANORO ELLIPTA) 62.5-25 MCG/ACT  AEPB Inhale 1 puff into the lungs daily.   ezetimibe (ZETIA) 10 MG tablet Take 1 tablet (10 mg total) by mouth daily.   metoprolol tartrate (LOPRESSOR) 50 MG tablet Take 1 tablet (50 mg total) by mouth once for 1 dose. PLEASE TAKE METOPROLOL 2  HOURS PRIOR TO CTA SCAN.   No facility-administered encounter medications on file as of 06/05/2022.     Review of Systems  Review of Systems  N/a Physical Exam  BP (!) 142/90   Pulse 83   Temp 98 F (36.7 C) (Oral)   Ht 5' 5.5" (1.664 m)   Wt (!) 352 lb (159.7 kg)   LMP 10/16/2013 (Approximate)   SpO2 98%   BMI 57.68 kg/m   Wt Readings from Last 5 Encounters:  06/05/22 (!) 352 lb (159.7 kg)  05/30/22 (!) 364 lb (165.1 kg)  05/26/22 (!) 365 lb 8 oz (165.8 kg)  02/12/22 (!) 353 lb (160.1 kg)  01/28/22 (!) 363 lb 9.6 oz (164.9 kg)    BMI Readings from Last 5 Encounters:  06/05/22 57.68 kg/m  05/30/22 58.75 kg/m  05/26/22 60.82 kg/m  02/12/22 57.85 kg/m  01/28/22 60.51 kg/m     Physical Exam General: Sitting in chair, no acute distress Eyes: EOMI, no icterus Neck: Supple, no JVP Pulmonary: Clear, normal work of breathing Cardiovascular warm, no edema Abdomen: Nondistended, bowel sounds present MSK: No synovitis, no joint effusion Neuro: Normal gait, no weakness Psych: Normal mood, full affect   Assessment & Plan:   Recurrent pneumonias: Presumably normal immune system.  Immunoglobulin levels normal.  She has a hiatal hernia and endorses coughing at night frequently.  High suspicion for silent or frank aspiration particular with lying supine related to hiatal hernia and reflux causing recurrent pneumonias and x-ray changes.  Serial chest x-rays revealed gradual improvement in infiltrates.  GERD/recurrent aspiration: Continue PPI.  Counseled lifestyle modification many which she is doing related to diet and time of eating.  Recommend wedge for elevating head post on bed.  Fortunately, she had recently ordered a wedge  pillow.  If this is not comfortable instructed her on how to elevate head of bed with phone that she has at home.  She has been losing weight and was congratulated on this effort.  Has additional 50 or so pounds to get before bariatric surgery and what sounds like plan Niesen fundoplication.  I think her symptoms of cough as well as recurrent pneumonias will be difficult to control with hiatal hernia unsecured.  Asthma: Diagnosed as a child.  Symptoms not too bad as older adult.  Does use albuterol with coughing fits and with some shortness of breath.  Particular when she is ill with pneumonia.  She does find this beneficial.  Sample of Anoro was helpful.  Stiolto appears preferred by [employer], new prescription for [employer] today.  Avoiding ICS given recurrent pneumonias.  Concern for sleep apnea: Home sleep test ordered today.   Return in about 3 months (around 09/03/2022).   Lanier Clam, MD 06/05/2022

## 2022-06-05 NOTE — Patient Instructions (Addendum)
Nice to see you again  I ordered a home sleep test to evaluate for sleep apnea  Prescribed a new medication similar to Anoro: [employer].  Use 2 puffs once a day every day.  Hopefully will help you like the Anoro did what appears would be better covered with your insurance.  If the medicine is too expensive, please let me know and we will look for an alternative.  You can go to YouTube and search "[employer] how to use the Respimat" for primary refresher on how to use this medicine called Stiolto  Please let me know if I can help in any way with the upcoming surgery or any plans.  Return to clinic in 3 months or sooner as needed with Dr. Silas Flood

## 2022-06-06 ENCOUNTER — Ambulatory Visit: Payer: Medicare HMO

## 2022-06-06 ENCOUNTER — Encounter: Payer: Self-pay | Admitting: Oncology

## 2022-06-06 DIAGNOSIS — G4733 Obstructive sleep apnea (adult) (pediatric): Secondary | ICD-10-CM | POA: Diagnosis not present

## 2022-06-11 ENCOUNTER — Encounter: Payer: Self-pay | Admitting: Student

## 2022-06-11 ENCOUNTER — Ambulatory Visit (INDEPENDENT_AMBULATORY_CARE_PROVIDER_SITE_OTHER): Payer: Medicare HMO | Admitting: Student

## 2022-06-11 ENCOUNTER — Ambulatory Visit: Payer: Medicare HMO

## 2022-06-11 ENCOUNTER — Other Ambulatory Visit: Payer: Self-pay

## 2022-06-11 VITALS — BP 137/61 | HR 67 | Ht 66.0 in | Wt 358.8 lb

## 2022-06-11 DIAGNOSIS — S99921D Unspecified injury of right foot, subsequent encounter: Secondary | ICD-10-CM | POA: Diagnosis not present

## 2022-06-11 NOTE — Patient Instructions (Addendum)
It was great to see you! Thank you for allowing me to participate in your care!   Our plans for today:  - Elevate and rest your foot when you are able - Continue taking tylenol for pain as needed  - return as needed  Take care and seek immediate care sooner if you develop any concerns.   Dr. Precious Gilding, DO [facility]

## 2022-06-11 NOTE — Progress Notes (Unsigned)
    SUBJECTIVE:   CHIEF COMPLAINT / HPI:   R Foot injury Pt was in ED 05/30/2022 after stepping on a nail which punctured right foot, husband pulled it out. In ED she received tetanus vaccine and was given 5 day course of ciprofloxacin which she completed.   Today she states her foot is still a little swollen, more after being on it all day. Still sore when walking on it.  She has been taking Tylenol as needed for the pain which helps.   OBJECTIVE:   BP 137/61   Pulse 67   Ht '5\' 6"'$  (1.676 m)   Wt (!) 358 lb 12.8 oz (162.8 kg)   LMP 10/16/2013 (Approximate)   SpO2 100%   BMI 57.91 kg/m    General: NAD, pleasant, able to participate in exam Cardiac: Well-perfused Respiratory: Breathing comfortably on room air Extremities: Ball of right foot with healing puncture wound, no erythema or edema around puncture site, no drainage. Second and third toes of right foot with very mild edema.  Able to wiggle toes, sensation intact. Neuro: alert, no obvious focal deficits Psych: Normal affect and mood  ASSESSMENT/PLAN:   Injury of right foot Puncture wound is healing well.  No need for further antibiotics.  Swelling of second and third toes to be expected after injury as puncture wound was right below them.  Patient advised to continue elevating and resting foot when possible as it continues to heal and continue with Tylenol for pain as needed.     Dr. Precious Gilding, [place]

## 2022-06-12 DIAGNOSIS — S99921A Unspecified injury of right foot, initial encounter: Secondary | ICD-10-CM | POA: Insufficient documentation

## 2022-06-12 NOTE — Assessment & Plan Note (Signed)
Puncture wound is healing well.  No need for further antibiotics.  Swelling of second and third toes to be expected after injury as puncture wound was right below them.  Patient advised to continue elevating and resting foot when possible as it continues to heal and continue with Tylenol for pain as needed.

## 2022-06-20 ENCOUNTER — Telehealth: Payer: Self-pay | Admitting: Pulmonary Disease

## 2022-06-20 DIAGNOSIS — G4733 Obstructive sleep apnea (adult) (pediatric): Secondary | ICD-10-CM | POA: Diagnosis not present

## 2022-06-20 NOTE — Telephone Encounter (Signed)
Call patient  Sleep study result  Date of study: 06/08/2022  Impression: Mild obstructive sleep apnea Moderate oxygen desaturations  Recommendation: Options of treatment for mild obstructive sleep apnea will include  1.  CPAP therapy if there is significant daytime sleepiness or other comorbidities including history of CVA or cardiac disease  -If CPAP is chosen as an option of treatment auto titrating CPAP with a pressure setting of 5-15 will be appropriate  2.  Watchful waiting with emphasis on weight loss measures, sleep position modification to optimize lateral sleep, elevating the head of the bed by about 30 degrees may also help.  3.  An oral device may be fashioned for the treatment of mild sleep disordered breathing, will involve referral to dentist.  Will highly recommend CPAP therapy because of the associated hypoxemia  Follow-up as previously scheduled

## 2022-06-23 ENCOUNTER — Ambulatory Visit: Payer: Medicare HMO | Admitting: Primary Care

## 2022-06-25 ENCOUNTER — Inpatient Hospital Stay (HOSPITAL_BASED_OUTPATIENT_CLINIC_OR_DEPARTMENT_OTHER)
Admission: EM | Admit: 2022-06-25 | Discharge: 2022-06-28 | DRG: 177 | Disposition: A | Discharge status: Home / Self Care | Payer: Medicare HMO | Attending: Internal Medicine | Admitting: Internal Medicine

## 2022-06-25 ENCOUNTER — Emergency Department (HOSPITAL_BASED_OUTPATIENT_CLINIC_OR_DEPARTMENT_OTHER): Payer: Medicare HMO

## 2022-06-25 ENCOUNTER — Encounter (HOSPITAL_BASED_OUTPATIENT_CLINIC_OR_DEPARTMENT_OTHER): Payer: Self-pay

## 2022-06-25 ENCOUNTER — Inpatient Hospital Stay (HOSPITAL_COMMUNITY): Payer: Medicare HMO

## 2022-06-25 ENCOUNTER — Other Ambulatory Visit: Payer: Self-pay

## 2022-06-25 DIAGNOSIS — F419 Anxiety disorder, unspecified: Secondary | ICD-10-CM | POA: Diagnosis present

## 2022-06-25 DIAGNOSIS — Z833 Family history of diabetes mellitus: Secondary | ICD-10-CM | POA: Diagnosis not present

## 2022-06-25 DIAGNOSIS — Z800 Family history of malignant neoplasm of digestive organs: Secondary | ICD-10-CM | POA: Diagnosis not present

## 2022-06-25 DIAGNOSIS — Z807 Family history of other malignant neoplasms of lymphoid, hematopoietic and related tissues: Secondary | ICD-10-CM

## 2022-06-25 DIAGNOSIS — R0602 Shortness of breath: Secondary | ICD-10-CM

## 2022-06-25 DIAGNOSIS — Z8543 Personal history of malignant neoplasm of ovary: Secondary | ICD-10-CM

## 2022-06-25 DIAGNOSIS — Z83719 Family history of colon polyps, unspecified: Secondary | ICD-10-CM

## 2022-06-25 DIAGNOSIS — Z8542 Personal history of malignant neoplasm of other parts of uterus: Secondary | ICD-10-CM | POA: Diagnosis not present

## 2022-06-25 DIAGNOSIS — Z9221 Personal history of antineoplastic chemotherapy: Secondary | ICD-10-CM

## 2022-06-25 DIAGNOSIS — W449XXD Unspecified foreign body entering into or through a natural orifice, subsequent encounter: Secondary | ICD-10-CM

## 2022-06-25 DIAGNOSIS — Z86718 Personal history of other venous thrombosis and embolism: Secondary | ICD-10-CM

## 2022-06-25 DIAGNOSIS — K219 Gastro-esophageal reflux disease without esophagitis: Secondary | ICD-10-CM | POA: Diagnosis present

## 2022-06-25 DIAGNOSIS — J189 Pneumonia, unspecified organism: Secondary | ICD-10-CM | POA: Diagnosis present

## 2022-06-25 DIAGNOSIS — Z8249 Family history of ischemic heart disease and other diseases of the circulatory system: Secondary | ICD-10-CM | POA: Diagnosis not present

## 2022-06-25 DIAGNOSIS — J9601 Acute respiratory failure with hypoxia: Secondary | ICD-10-CM | POA: Diagnosis present

## 2022-06-25 DIAGNOSIS — I5032 Chronic diastolic (congestive) heart failure: Secondary | ICD-10-CM | POA: Diagnosis present

## 2022-06-25 DIAGNOSIS — Z6841 Body Mass Index (BMI) 40.0 and over, adult: Secondary | ICD-10-CM | POA: Diagnosis not present

## 2022-06-25 DIAGNOSIS — R7303 Prediabetes: Secondary | ICD-10-CM | POA: Diagnosis present

## 2022-06-25 DIAGNOSIS — M349 Systemic sclerosis, unspecified: Secondary | ICD-10-CM | POA: Diagnosis present

## 2022-06-25 DIAGNOSIS — E785 Hyperlipidemia, unspecified: Secondary | ICD-10-CM | POA: Diagnosis present

## 2022-06-25 DIAGNOSIS — M329 Systemic lupus erythematosus, unspecified: Secondary | ICD-10-CM | POA: Diagnosis present

## 2022-06-25 DIAGNOSIS — J80 Acute respiratory distress syndrome: Secondary | ICD-10-CM | POA: Diagnosis present

## 2022-06-25 DIAGNOSIS — Z5986 Financial insecurity: Secondary | ICD-10-CM | POA: Diagnosis not present

## 2022-06-25 DIAGNOSIS — Z79899 Other long term (current) drug therapy: Secondary | ICD-10-CM | POA: Diagnosis not present

## 2022-06-25 DIAGNOSIS — I10 Essential (primary) hypertension: Secondary | ICD-10-CM | POA: Diagnosis present

## 2022-06-25 DIAGNOSIS — T17908A Unspecified foreign body in respiratory tract, part unspecified causing other injury, initial encounter: Secondary | ICD-10-CM | POA: Diagnosis present

## 2022-06-25 DIAGNOSIS — G4733 Obstructive sleep apnea (adult) (pediatric): Secondary | ICD-10-CM | POA: Diagnosis present

## 2022-06-25 DIAGNOSIS — J690 Pneumonitis due to inhalation of food and vomit: Principal | ICD-10-CM | POA: Diagnosis present

## 2022-06-25 DIAGNOSIS — Z8701 Personal history of pneumonia (recurrent): Secondary | ICD-10-CM

## 2022-06-25 DIAGNOSIS — Z1152 Encounter for screening for COVID-19: Secondary | ICD-10-CM | POA: Diagnosis not present

## 2022-06-25 DIAGNOSIS — J188 Other pneumonia, unspecified organism: Secondary | ICD-10-CM

## 2022-06-25 DIAGNOSIS — Z83438 Family history of other disorder of lipoprotein metabolism and other lipidemia: Secondary | ICD-10-CM | POA: Diagnosis not present

## 2022-06-25 DIAGNOSIS — Z8673 Personal history of transient ischemic attack (TIA), and cerebral infarction without residual deficits: Secondary | ICD-10-CM

## 2022-06-25 DIAGNOSIS — Z86711 Personal history of pulmonary embolism: Secondary | ICD-10-CM

## 2022-06-25 DIAGNOSIS — R000 Tachycardia, unspecified: Secondary | ICD-10-CM | POA: Diagnosis present

## 2022-06-25 DIAGNOSIS — Z635 Disruption of family by separation and divorce: Secondary | ICD-10-CM

## 2022-06-25 DIAGNOSIS — Z880 Allergy status to penicillin: Secondary | ICD-10-CM

## 2022-06-25 DIAGNOSIS — I69392 Facial weakness following cerebral infarction: Secondary | ICD-10-CM

## 2022-06-25 DIAGNOSIS — I69328 Other speech and language deficits following cerebral infarction: Secondary | ICD-10-CM

## 2022-06-25 DIAGNOSIS — I110 Hypertensive heart disease with heart failure: Secondary | ICD-10-CM | POA: Diagnosis present

## 2022-06-25 DIAGNOSIS — Z87440 Personal history of urinary (tract) infections: Secondary | ICD-10-CM

## 2022-06-25 DIAGNOSIS — T17908D Unspecified foreign body in respiratory tract, part unspecified causing other injury, subsequent encounter: Secondary | ICD-10-CM

## 2022-06-25 DIAGNOSIS — Z825 Family history of asthma and other chronic lower respiratory diseases: Secondary | ICD-10-CM

## 2022-06-25 DIAGNOSIS — Z7901 Long term (current) use of anticoagulants: Secondary | ICD-10-CM

## 2022-06-25 DIAGNOSIS — K449 Diaphragmatic hernia without obstruction or gangrene: Secondary | ICD-10-CM | POA: Diagnosis present

## 2022-06-25 LAB — GLUCOSE, CAPILLARY
Glucose-Capillary: 133 mg/dL — ABNORMAL HIGH (ref 70–99)
Glucose-Capillary: 125 mg/dL — ABNORMAL HIGH (ref 70–99)
Glucose-Capillary: 143 mg/dL — ABNORMAL HIGH (ref 70–99)

## 2022-06-25 LAB — RESPIRATORY PANEL BY PCR

## 2022-06-25 LAB — BASIC METABOLIC PANEL
Anion gap: 11 (ref 5–15)
BUN: 25 mg/dL — ABNORMAL HIGH (ref 6–20)
CO2: 22 mmol/L (ref 22–32)
Calcium: 9.6 mg/dL (ref 8.9–10.3)
Chloride: 106 mmol/L (ref 98–111)
Creatinine, Ser: 1 mg/dL (ref 0.44–1.00)
GFR, Estimated: >60 mL/min (ref >60)
Glucose, Bld: 147 mg/dL — ABNORMAL HIGH (ref 70–99)
Potassium: 3.8 mmol/L (ref 3.5–5.1)
Sodium: 139 mmol/L (ref 135–145)

## 2022-06-25 LAB — TROPONIN I (HIGH SENSITIVITY)
Troponin I (High Sensitivity): 7 ng/L (ref <18)
Troponin I (High Sensitivity): 8 ng/L (ref <18)

## 2022-06-25 LAB — ECHOCARDIOGRAM COMPLETE
Height: 65 in
Weight: 5760.18 oz

## 2022-06-25 LAB — PROTIME-INR
INR: 1.4 — ABNORMAL HIGH (ref 0.8–1.2)
Prothrombin Time: 16.6 seconds — ABNORMAL HIGH (ref 11.4–15.2)

## 2022-06-25 LAB — MRSA NEXT GEN BY PCR, NASAL: MRSA by PCR Next Gen: NOT DETECTED

## 2022-06-25 LAB — CBC
HCT: 34.2 % — ABNORMAL LOW (ref 36.0–46.0)
Hemoglobin: 10.4 g/dL — ABNORMAL LOW (ref 12.0–15.0)
MCH: 25.7 pg — ABNORMAL LOW (ref 26.0–34.0)
MCHC: 30.4 g/dL (ref 30.0–36.0)
MCV: 84.4 fL (ref 80.0–100.0)
Platelets: 260 10*3/uL (ref 150–400)
RBC: 4.05 MIL/uL (ref 3.87–5.11)
RDW: 15.5 % (ref 11.5–15.5)
WBC: 12.5 10*3/uL — ABNORMAL HIGH (ref 4.0–10.5)
nRBC: 0 % (ref 0.0–0.2)

## 2022-06-25 LAB — RESP PANEL BY RT-PCR (RSV, FLU A&B, COVID)  RVPGX2
Influenza A by PCR: NEGATIVE
Influenza B by PCR: NEGATIVE
Resp Syncytial Virus by PCR: NEGATIVE
SARS Coronavirus 2 by RT PCR: NEGATIVE

## 2022-06-25 LAB — STREP PNEUMONIAE URINARY ANTIGEN: Strep Pneumo Urinary Antigen: NEGATIVE

## 2022-06-25 LAB — BRAIN NATRIURETIC PEPTIDE: B Natriuretic Peptide: 68.3 pg/mL (ref 0.0–100.0)

## 2022-06-25 MED ORDER — RIVAROXABAN 20 MG PO TABS
20.0000 mg | ORAL_TABLET | Freq: Every day | ORAL | Status: DC
  Administered 2022-06-25 – 2022-06-27 (×3): 20 mg via ORAL
  Filled 2022-06-25 (×3): qty 1

## 2022-06-25 MED ORDER — ARFORMOTEROL TARTRATE 15 MCG/2ML IN NEBU
15.0000 ug | INHALATION_SOLUTION | Freq: Two times a day (BID) | RESPIRATORY_TRACT | Status: DC
  Administered 2022-06-25 – 2022-06-28 (×7): 15 ug via RESPIRATORY_TRACT
  Filled 2022-06-25 (×7): qty 2

## 2022-06-25 MED ORDER — UMECLIDINIUM BROMIDE 62.5 MCG/ACT IN AEPB
1.0000 | INHALATION_SPRAY | Freq: Every day | RESPIRATORY_TRACT | Status: DC
  Administered 2022-06-25 – 2022-06-28 (×4): 1 via RESPIRATORY_TRACT
  Filled 2022-06-25: qty 7

## 2022-06-25 MED ORDER — FESOTERODINE FUMARATE ER 8 MG PO TB24
8.0000 mg | ORAL_TABLET | Freq: Every day | ORAL | Status: DC
  Administered 2022-06-25: 8 mg via ORAL
  Filled 2022-06-25 (×2): qty 1

## 2022-06-25 MED ORDER — IPRATROPIUM-ALBUTEROL 0.5-2.5 (3) MG/3ML IN SOLN: 3.0000 mL | Freq: Two times a day (BID) | RESPIRATORY_TRACT | Status: DC

## 2022-06-25 MED ORDER — ONDANSETRON HCL 4 MG/2ML IJ SOLN
4.0000 mg | Freq: Once | INTRAMUSCULAR | Status: AC
  Administered 2022-06-25: 4 mg via INTRAVENOUS
  Filled 2022-06-25: qty 2

## 2022-06-25 MED ORDER — IPRATROPIUM-ALBUTEROL 0.5-2.5 (3) MG/3ML IN SOLN
3.0000 mL | Freq: Four times a day (QID) | RESPIRATORY_TRACT | Status: DC
  Administered 2022-06-25: 3 mL via RESPIRATORY_TRACT
  Filled 2022-06-25: qty 3

## 2022-06-25 MED ORDER — SODIUM CHLORIDE 0.9 % IV SOLN
2.0000 g | INTRAVENOUS | Status: DC
  Administered 2022-06-26 – 2022-06-28 (×3): 2 g via INTRAVENOUS
  Filled 2022-06-25 (×3): qty 20

## 2022-06-25 MED ORDER — ONDANSETRON HCL 4 MG/2ML IJ SOLN: 4.0000 mg | Freq: Four times a day (QID) | INTRAMUSCULAR | Status: DC | PRN

## 2022-06-25 MED ORDER — ORAL CARE MOUTH RINSE: 15.0000 mL | OROMUCOSAL | Status: DC | PRN

## 2022-06-25 MED ORDER — INSULIN ASPART 100 UNIT/ML IJ SOLN
0.0000 [IU] | Freq: Three times a day (TID) | INTRAMUSCULAR | Status: DC
  Administered 2022-06-25: 3 [IU] via SUBCUTANEOUS
  Administered 2022-06-26: 4 [IU] via SUBCUTANEOUS

## 2022-06-25 MED ORDER — PANTOPRAZOLE SODIUM 40 MG PO TBEC
40.0000 mg | DELAYED_RELEASE_TABLET | Freq: Every day | ORAL | Status: DC
  Administered 2022-06-25 – 2022-06-28 (×4): 40 mg via ORAL
  Filled 2022-06-25 (×4): qty 1

## 2022-06-25 MED ORDER — ACETAMINOPHEN 325 MG PO TABS
650.0000 mg | ORAL_TABLET | Freq: Four times a day (QID) | ORAL | Status: DC | PRN
  Administered 2022-06-25 – 2022-06-28 (×5): 650 mg via ORAL
  Filled 2022-06-25 (×4): qty 2

## 2022-06-25 MED ORDER — PERFLUTREN LIPID MICROSPHERE
1.0000 mL | INTRAVENOUS | Status: AC | PRN
  Administered 2022-06-25: 2 mL via INTRAVENOUS

## 2022-06-25 MED ORDER — PROMETHAZINE-DM 6.25-15 MG/5ML PO SYRP: 5.0000 mL | ORAL_SOLUTION | Freq: Four times a day (QID) | ORAL | Status: DC | PRN

## 2022-06-25 MED ORDER — ALBUTEROL SULFATE (2.5 MG/3ML) 0.083% IN NEBU: 2.5000 mg | INHALATION_SOLUTION | RESPIRATORY_TRACT | Status: DC | PRN

## 2022-06-25 MED ORDER — GUAIFENESIN ER 600 MG PO TB12
600.0000 mg | ORAL_TABLET | Freq: Two times a day (BID) | ORAL | Status: DC
  Administered 2022-06-25 – 2022-06-28 (×7): 600 mg via ORAL
  Filled 2022-06-25 (×7): qty 1

## 2022-06-25 MED ORDER — IOHEXOL 350 MG/ML SOLN
100.0000 mL | Freq: Once | INTRAVENOUS | Status: AC | PRN
  Administered 2022-06-25: 100 mL via INTRAVENOUS

## 2022-06-25 MED ORDER — ACETAMINOPHEN 650 MG RE SUPP: 650.0000 mg | Freq: Four times a day (QID) | RECTAL | Status: DC | PRN

## 2022-06-25 MED ORDER — ONDANSETRON HCL 4 MG PO TABS: 4.0000 mg | ORAL_TABLET | Freq: Four times a day (QID) | ORAL | Status: DC | PRN

## 2022-06-25 MED ORDER — SODIUM CHLORIDE 0.9 % IV SOLN
500.0000 mg | INTRAVENOUS | Status: DC
  Administered 2022-06-26 – 2022-06-27 (×2): 500 mg via INTRAVENOUS
  Filled 2022-06-25 (×2): qty 5

## 2022-06-25 MED ORDER — SODIUM CHLORIDE 0.9 % IV SOLN
2.0000 g | Freq: Once | INTRAVENOUS | Status: AC
  Administered 2022-06-25: 2 g via INTRAVENOUS
  Filled 2022-06-25: qty 20

## 2022-06-25 MED ORDER — CHLORHEXIDINE GLUCONATE CLOTH 2 % EX PADS
6.0000 | MEDICATED_PAD | Freq: Every day | CUTANEOUS | Status: DC
  Administered 2022-06-25 – 2022-06-27 (×3): 6 via TOPICAL

## 2022-06-25 MED ORDER — SODIUM CHLORIDE 0.9 % IV SOLN
500.0000 mg | Freq: Once | INTRAVENOUS | Status: AC
  Administered 2022-06-25: 500 mg via INTRAVENOUS
  Filled 2022-06-25: qty 5

## 2022-06-25 MED ORDER — AMLODIPINE BESYLATE 5 MG PO TABS
5.0000 mg | ORAL_TABLET | Freq: Every day | ORAL | Status: DC
  Administered 2022-06-25 – 2022-06-28 (×4): 5 mg via ORAL
  Filled 2022-06-25 (×4): qty 1

## 2022-06-25 NOTE — ED Provider Notes (Signed)
[facility] Emergency Department Provider Note MRN:  [id number]  Arrival date & time: 06/25/22     Chief Complaint   Shortness of Breath   History of Present Illness   [name] is a 57 y.o. year-old female with a history of recurrent pneumonia, DVT presenting to the ED with chief complaint of shortness of breath.  Sudden onset shortness of breath this evening.  Some cough today as well.  Denies fever, no chest pain.  No recent leg pain or swelling.  Review of Systems  A thorough review of systems was obtained and all systems are negative except as noted in the HPI and PMH.   Patient's Health History    Past Medical History:  Diagnosis Date   Allergy    Anemia    Anxiety    Arthritis    Blood transfusion without reported diagnosis    Cough    Cough 06/11/2018   Difficulty sleeping 06/11/2018   Diverticulitis with perforation 2010   DVT (deep vein thrombosis) in pregnancy 11/24/2016   DVT, lower extremity (HCC)    Endometrial cancer ([place])    Enlarged lymph nodes 08/13/2017   Family history of adverse reaction to anesthesia    pts daughter had difficulty awakening following anesthesia, long time to wake up   GERD (gastroesophageal reflux disease)    Headache    Hemoptysis 04/22/2018   History of bronchitis    History of ear infections    Hospital discharge follow-up 03/02/2019   Hx of migraines    Hyperlipidemia    Hypertension    IBS (irritable bowel syndrome)    Kidney infection    Lupus (systemic lupus erythematosus) ([place]) 02/2017   Morbid obesity ([place])    Neck pain 01/13/2019   Ovarian cancer ([place]) dx'd 01/2015   PONV (postoperative nausea and vomiting)    Port-A-Cath in place    Right side   Portacath in place 09/12/2015   Pre-diabetes    Pyelonephritis    Right knee pain 11/28/2011   Scleroderma ([place])    Screen for colon cancer 03/02/2019   Slow rate of speech 08/22/2015   Chronic from CVA.  She also has loss of left nasolabial  fold and slight left facial droop/small asymmetry on the left side secondary to CVA   UTI (urinary tract infection) 05/07/2018    Past Surgical History:  Procedure Laterality Date   ABDOMINAL HYSTERECTOMY N/A 03/13/2015   Procedure: TOTAL HYSTERECTOMY ABDOMINAL BILATERAL SALPINGO OOPHORECTOMY RADICAL TUMOR [place];  Surgeon: Everitt Amber, MD;  Location: [facility];  Service: Gynecology;  Laterality: N/A;   BOWEL RESECTION  03/13/2015   Procedure: SMALL BOWEL RESECTION;  Surgeon: Everitt Amber, MD;  Location: [facility];  Service: Gynecology;;   [place] and 1984   COLONOSCOPY WITH PROPOFOL N/A 07/19/2020   Procedure: COLONOSCOPY WITH PROPOFOL;  Surgeon: Doran Stabler, MD;  Location: [facility];  Service: Gastroenterology;  Laterality: N/A;   COLOSTOMY  2008   COLOSTOMY TAKEDOWN     ESOPHAGOGASTRODUODENOSCOPY (EGD) WITH PROPOFOL N/A 12/21/2018   Procedure: ESOPHAGOGASTRODUODENOSCOPY (EGD) WITH PROPOFOL;  Surgeon: Doran Stabler, MD;  [facility]: [facility];  Service: Gastroenterology;  Laterality: N/A;   HEMOSTASIS CLIP PLACEMENT  07/19/2020   Procedure: HEMOSTASIS CLIP PLACEMENT;  Surgeon: Doran Stabler, MD;  Location: [facility];  Service: Gastroenterology;;   LAPAROTOMY N/A 03/13/2015   Procedure: EXPLORATORY LAPAROTOMY;  Surgeon: Everitt Amber, MD;  Location: [facility];  Service: Gynecology;  Laterality: N/A;   POLYPECTOMY  07/19/2020   Procedure: POLYPECTOMY;  Surgeon: Doran Stabler, MD;  Location: [facility];  Service: Gastroenterology;;   TONSILLECTOMY AND [place]  03/13/2015   Procedure: HERNIA REPAIR VENTRAL ADULT;  Surgeon: Everitt Amber, MD;  Location: [facility];  Service: Gynecology;;    Family History  Problem Relation Age of Onset   Diabetes Mother    Hypertension Mother    Hyperlipidemia Mother    Colon polyps Mother        62 total   Fibroids Mother        s/p hysterectomy   Hyperlipidemia Father    Diabetes Daughter     Hodgkin's lymphoma Daughter 75   Asthma Maternal Aunt        severe - d. 24   Prostate cancer Maternal Uncle 24   Diabetes Paternal Aunt    Diabetes Paternal Uncle    Kidney failure Maternal Grandmother 84   Pancreatic cancer Paternal Grandmother        dx. >50   Diabetes Paternal Grandfather    Diabetes Paternal Aunt    Heart disease Neg Hx    Stroke Neg Hx    Colon cancer Neg Hx    Stomach cancer Neg Hx    Esophageal cancer Neg Hx    Rectal cancer Neg Hx     Social History   Socioeconomic History   Marital status: Legally Separated    Spouse name: Not on file   Number of children: Not on file   Years of education: Not on file   Highest education level: Not on file  Occupational History   Not on file  Tobacco Use   Smoking status: Never   Smokeless tobacco: Never  Vaping Use   Vaping Use: Never used  Substance and Sexual Activity   Alcohol use: Yes    Comment: Maybe wine every 6 months   Drug use: No   Sexual activity: Yes    Partners: Male    Comment: Told she could not have more children in 1986  Other Topics Concern   Not on file  Social History Narrative   Works part time at [employer] (13 years as of 2015) - former Librarian, academic, 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   Married x 23 years, recently separated (4/15).  No domestic violence.  + financial stress.     Lives previously with husband who moved out, now with daughter who has medical problems, including Hodgkin's disease, and granddaughter (age 39).     Uses seat belt.    Social Determinants of Health   Financial Resource Strain: High Risk (06/23/2018)   Overall Financial Resource Strain (CARDIA)    Difficulty of Paying Living Expenses: Very hard  Food Insecurity: Food Insecurity Present (06/23/2018)   Hunger Vital Sign    Worried About Running Out of Food in the Last Year: Sometimes true    Ran Out of Food in the Last Year: Sometimes true  Transportation Needs: No Transportation Needs  (06/23/2018)   PRAPARE - Hydrologist (Medical): No    Lack of Transportation (Non-Medical): No  Physical Activity: Not on file  Stress: No Stress Concern Present (06/23/2018)   [place]    Feeling of Stress : Only a little  Social Connections: Not on file  Intimate Partner Violence: Not on file  Physical Exam   Vitals:   06/25/22 0550 06/25/22 0610  BP:  (!) 157/96  Pulse:    Resp: (!) 23 (!) 26  Temp:    SpO2: 98%     CONSTITUTIONAL: Well-appearing, moderate respiratory distress NEURO/PSYCH:  Alert and oriented x 3, no focal deficits EYES:  eyes equal and reactive ENT/NECK:  no LAD, no JVD CARDIO: Tachycardic rate, well-perfused, normal S1 and S2 PULM: Tachypnea, scattered rhonchi, no wheeze GI/GU:  non-distended, non-tender MSK/SPINE:  No gross deformities, no edema SKIN:  no rash, atraumatic   *Additional and/or pertinent findings included in MDM below  Diagnostic and Interventional Summary    EKG Interpretation  Date/Time:  June 25, 2022 at 4: 43: 02 Ventricular Rate:   95 PR Interval:    QRS Duration:   QT Interval:    QTC Calculation:   R Axis:     Text Interpretation: Sinus rhythm       Labs Reviewed  BASIC METABOLIC PANEL - Abnormal; Notable for the following components:      Result Value   Glucose, Bld 147 (*)    BUN 25 (*)    All other components within normal limits  CBC - Abnormal; Notable for the following components:   WBC 12.5 (*)    Hemoglobin 10.4 (*)    HCT 34.2 (*)    MCH 25.7 (*)    All other components within normal limits  PROTIME-INR - Abnormal; Notable for the following components:   Prothrombin Time 16.6 (*)    INR 1.4 (*)    All other components within normal limits  RESP PANEL BY RT-PCR (RSV, FLU A&B, COVID)  [id number]  BRAIN NATRIURETIC PEPTIDE  TROPONIN I (HIGH SENSITIVITY)  TROPONIN I (HIGH SENSITIVITY)    CT Angio Chest  Pulmonary Embolism (PE) W or WO Contrast  Final Result    DG Chest Portable 1 View  Final Result      Medications  cefTRIAXone (ROCEPHIN) 2 g in sodium chloride 0.9 % 100 mL IVPB (has no administration in time range)  azithromycin (ZITHROMAX) 500 mg in sodium chloride 0.9 % 250 mL IVPB (500 mg Intravenous New Bag/Given 06/25/22 0636)  ondansetron (ZOFRAN) injection 4 mg (4 mg Intravenous Given 06/25/22 0505)  iohexol (OMNIPAQUE) 350 MG/ML injection 100 mL (100 mLs Intravenous Contrast Given 06/25/22 0551)     Procedures  /  Critical Care .Critical Care  Performed by: Maudie Flakes, MD Authorized by: Maudie Flakes, MD   Critical care provider statement:    Critical care time (minutes):  45   Critical care was necessary to treat or prevent imminent or life-threatening deterioration of the following conditions:  Respiratory failure   Critical care was time spent personally by me on the following activities:  Development of treatment plan with patient or surrogate, discussions with consultants, evaluation of patient's response to treatment, examination of patient, ordering and review of laboratory studies, ordering and review of radiographic studies, ordering and performing treatments and interventions, pulse oximetry, re-evaluation of patient's condition and review of old charts   ED Course and Medical Decision Making  Initial Impression and Ddx Hypoxic respiratory failure, recent cough.  Hemodynamically doing well.  Feels much more comfortable on supplemental oxygen.  Requiring 10 to 15 L high flow nasal cannula.  Differential diagnosis includes pneumonia given patient's history of recurrent pneumonia.  Per pulmonology notes they suspect recurrent aspiration events.  PE is also considered given the acute nature.  CHF on the differential as  well.  Past medical/surgical history that increases complexity of ED encounter: Recurrent pneumonia, CHF  Interpretation of Diagnostics I personally  reviewed the EKG and my interpretation is as follows: Sinus rhythm, no concerning ischemic features  Labs reveal no significant blood count or electrolyte disturbance, troponin negative.  CT is negative for PE, reveals diffuse multifocal pneumonia.  Patient Reassessment and Ultimate Disposition/Management     Will admit to medicine, likely stepdown.  Patient management required discussion with the following services or consulting groups:  Hospitalist Service  Complexity of Problems Addressed Acute illness or injury that poses threat of life of bodily function  Additional Data Reviewed and Analyzed Further history obtained from: Recent Consult notes  Additional Factors Impacting ED Encounter Risk Consideration of hospitalization  Barth Kirks. Sedonia Small, MD [place] [email or web address]  Final Clinical Impressions(s) / ED Diagnoses     ICD-10-CM   1. Acute respiratory failure with hypoxia (HCC)  J96.01     2. Multifocal pneumonia  J18.9       ED Discharge Orders     None        Discharge Instructions Discussed with and Provided to Patient:   Discharge Instructions   None      Maudie Flakes, MD 06/25/22 [phone]

## 2022-06-25 NOTE — Progress Notes (Signed)
  Echocardiogram 2D Echocardiogram has been performed.  Corrigan Kretschmer Renold Don 06/25/2022, 3:54 PM

## 2022-06-25 NOTE — ED Notes (Signed)
Called [facility] -- informed that the patient Bed Assignment is Ready 

## 2022-06-25 NOTE — ED Notes (Signed)
Attempted to call report. ICU will call back from report.

## 2022-06-25 NOTE — Progress Notes (Signed)
Plan of Care Note for accepted transfer   Patient: [name] MRN: [id number]   DOA: 06/25/2022  Facility requesting transfer: [facility]. Requesting Provider: Dr. Lavenia Atlas. Reason for transfer: Acute respiratory failure with hypoxia due to multifocal pneumonia. Facility course:  Dr. Gerlene Fee:  "Chief Complaint              Shortness of Breath   History of Present Illness              [name] is a 57 y.o. year-old female with a history of recurrent pneumonia, DVT presenting to the ED with chief complaint of shortness of breath.   Sudden onset shortness of breath this evening.  Some cough today as well.  Denies fever, no chest pain.  No recent leg pain or swelling.   Review of Systems  A thorough review of systems was obtained and all systems are negative except as noted in the HPI and PMH.    Patient's Health History                    Past Medical History:  Diagnosis Date   Allergy     Anemia     Anxiety     Arthritis     Blood transfusion without reported diagnosis     Cough     Cough 06/11/2018   Difficulty sleeping 06/11/2018   Diverticulitis with perforation 2010   DVT (deep vein thrombosis) in pregnancy 11/24/2016   DVT, lower extremity (HCC)     Endometrial cancer ([place])     Enlarged lymph nodes 08/13/2017   Family history of adverse reaction to anesthesia      pts daughter had difficulty awakening following anesthesia, long time to wake up   GERD (gastroesophageal reflux disease)     Headache     Hemoptysis 04/22/2018   History of bronchitis     History of ear infections     Hospital discharge follow-up 03/02/2019   Hx of migraines     Hyperlipidemia     Hypertension     IBS (irritable bowel syndrome)     Kidney infection     Lupus (systemic lupus erythematosus) ([place]) 02/2017   Morbid obesity ([place])     Neck pain 01/13/2019   Ovarian cancer ([place]) dx'd 01/2015   PONV (postoperative nausea and vomiting)     Port-A-Cath in place      Right side    Portacath in place 09/12/2015   Pre-diabetes     Pyelonephritis     Right knee pain 11/28/2011   Scleroderma ([place])     Screen for colon cancer 03/02/2019   Slow rate of speech 08/22/2015    Chronic from CVA.  She also has loss of left nasolabial fold and slight left facial droop/small asymmetry on the left side secondary to CVA   UTI (urinary tract infection) 1/   Lab work:  Resp panel by RT-PCR (RSV, Flu A&B, Covid) Anterior Nasal Swab [id number]   Collected: 06/25/22 0645   Updated: 06/25/22 0729   Specimen Source: Anterior Nasal Swab    SARS Coronavirus 2 by RT PCR NEGATIVE   Influenza A by PCR NEGATIVE   Influenza B by PCR NEGATIVE   Resp Syncytial Virus by PCR NEGATIVE  Troponin I (High Sensitivity) [id number]   Collected: 06/25/22 0645   Updated: 06/25/22 0721    Troponin I (High Sensitivity) 8 ng/L  Brain natriuretic peptide [id number]   Collected: 06/25/22 0452  Updated: 06/25/22 0546   Specimen Type: Blood    B Natriuretic Peptide 68.3 pg/mL  Basic metabolic panel [id number] (Abnormal)   Collected: 06/25/22 0452   Updated: 06/25/22 0529   Specimen Type: Blood    Sodium 139 mmol/L   Potassium 3.8 mmol/L   Chloride 106 mmol/L   CO2 22 mmol/L   Glucose, Bld 147 High  mg/dL   BUN 25 High  mg/dL   Creatinine, Ser 1.00 mg/dL   Calcium 9.6 mg/dL   GFR, Estimated >60 mL/min   Anion gap 11  Troponin I (High Sensitivity) [id number]   Collected: 06/25/22 0452   Updated: 06/25/22 0527    Troponin I (High Sensitivity) 7 ng/L  Protime-INR (order if Patient is taking Coumadin / Warfarin) [id number] (Abnormal)   Collected: 06/25/22 0452   Updated: 06/25/22 0506   Specimen Type: Blood    Prothrombin Time 16.6 High  seconds   INR 1.4 High   CBC [id number] (Abnormal)   Collected: 06/25/22 0452   Updated: 06/25/22 0502   Specimen Type: Blood    WBC 12.5 High  K/uL   RBC 4.05 MIL/uL   Hemoglobin 10.4 Low  g/dL   HCT 34.2 Low  %   MCV 84.4 fL   MCH 25.7 Low  pg    MCHC 30.4 g/dL   RDW 15.5 %   Platelets 260 K/uL   nRBC 0.0 %   Imaging: FINDINGS: Cardiovascular: There are no filling defects within the pulmonary arterial tree to suggest pulmonary embolism. Heart size is normal. There is no significant pericardial fluid, thickening or pericardial calcification. No atherosclerotic calcifications are noted in the thoracic aorta or the coronary arteries. Right internal jugular single-lumen Port-A-Cath with tip terminating in the distal superior vena cava.   Mediastinum/Nodes: Multiple prominent borderline enlarged mediastinal lymph nodes are noted, nonspecific, but similar to prior studies, presumably benign. Moderate hiatal hernia. No axillary lymphadenopathy.   Lungs/Pleura: Widespread but patchy areas of airspace consolidation scattered in a peribronchovascular distribution throughout the lungs bilaterally, most severe in the lower lobes of the lungs. No pleural effusions.   Upper Abdomen: Unremarkable.   Musculoskeletal: There are no aggressive appearing lytic or blastic lesions noted in the visualized portions of the skeleton.   Review of the MIP images confirms the above findings.   IMPRESSION: 1. No evidence of pulmonary embolism. 2. Extensive multilobar bilateral bronchopneumonia, most severe in the lower lobes of the lungs bilaterally, as above.   Electronically Signed   By: Vinnie Langton M.D.   On: 06/25/2022 06:29  Plan of care: The patient is accepted for admission to [place] unit, at [facility].  She is currently requiring 10 LPM of oxygen via HFNC.  She received ceftriaxone 2 g and azithromycin 500 mg IVPB.  Author: Reubin Milan, MD 06/25/2022  Check [email or web address] for on-call coverage.  Nursing staff, Please call [place] number on Amion as soon as patient's arrival, so appropriate admitting provider can evaluate the pt.

## 2022-06-25 NOTE — ED Triage Notes (Signed)
Awakened at 0330 with coughing up blood and shortness of breath. Chills, hypoxia noted upon arrival 67% RA. C/O chest soreness, 3/10 "it's from coughing"

## 2022-06-25 NOTE — H&P (Signed)
History and Physical    Patient: [name] [id number] DOB: Jan 09, 1966 DOA: 06/25/2022 DOS: the patient was seen and examined on 06/25/2022 PCP: Leeanne Rio, MD  Patient coming from: Home  Chief Complaint:  Chief Complaint  Patient presents with   Shortness of Breath   HPI: [name] is a 57 y.o. female with medical history significant of Allergy, anemia, anxiety, depression, osteoarthritis, cough, difficulty sleeping, diverticular-itis, DVT, endometrial cancer, headache, history of hemoptysis, history of bronchitis, history of ear infections, migraine headaches, hyperlipidemia, hypertension, IBS, pyelonephritis, lupus erythematosus, morbid obesity with a BMI of 59.91 kg/m, ovarian cancer, prediabetes, scleroderma, history of CVA with slow rate of speech, history of UTI who presented to the emergency department with acute onset of dyspnea with wheezing and hypoxia of 67% requiring HFNC at 10 LPM.  Patient thinks he may have aspirated.  No fever, chills or night sweats. No sore throat, rhinorrhea or hemoptysis.  No chest pain, palpitations, diaphoresis, PND, orthopnea or pitting edema of the lower extremities.  No appetite changes, abdominal pain, diarrhea, constipation, melena or hematochezia.  No flank pain, dysuria, frequency or hematuria.  No polyuria, polydipsia, polyphagia or blurred vision.  She was recently seen by pulmonology (Dr. Silas Flood) who recommended a sleep study.  Lab work: CBC showed white count 12.5, hemoglobin 10.4 g/dL platelets 260.  PT 16.6 and INR 1.4.  Troponin was normal x 2.  BNP 68.3 pg/mL.  Coronavirus, influenza and RSV PCR negative.  BMP showed a glucose of 147 and BUN of 25 mg/deciliter.  The rest of the BMP measurements were normal.  Imaging: Portable 1 view chest radiograph with increased central vascular prominence and basilar interstitial edema consistent with CHF or fluid overload.  Increased from prior hilar opacities which will be  alveolar edema and/for pneumonia.  Atelectasis could appear similar.  CTA chest with no evidence of PE.  Extensive multilobar bilateral bronchopneumonia most severe in the lower lobes of the lungs bilaterally.   ED course: Initial vital signs were temperature 98.2 F, pulse 95, respirations 33, BP 170/92 mmHg O2 sat 67% on 15 L via nasal cannula the patient received ondansetron 4 mg IVP ceftriaxone and azithromycin.  Review of Systems: As mentioned in the history of present illness. All other systems reviewed and are negative. Past Medical History:  Diagnosis Date   Allergy    Anemia    Anxiety    Arthritis    Blood transfusion without reported diagnosis    Cough    Cough 06/11/2018   Difficulty sleeping 06/11/2018   Diverticulitis with perforation 2010   DVT (deep vein thrombosis) in pregnancy 11/24/2016   DVT, lower extremity (HCC)    Endometrial cancer ([place])    Enlarged lymph nodes 08/13/2017   Family history of adverse reaction to anesthesia    pts daughter had difficulty awakening following anesthesia, long time to wake up   GERD (gastroesophageal reflux disease)    Headache    Hemoptysis 04/22/2018   History of bronchitis    History of ear infections    Hospital discharge follow-up 03/02/2019   Hx of migraines    Hyperlipidemia    Hypertension    IBS (irritable bowel syndrome)    Kidney infection    Lupus (systemic lupus erythematosus) ([place]) 02/2017   Morbid obesity ([place])    Neck pain 01/13/2019   Ovarian cancer ([place]) dx'd 01/2015   PONV (postoperative nausea and vomiting)    Port-A-Cath in place    Right side  Portacath in place 09/12/2015   Pre-diabetes    Pyelonephritis    Right knee pain 11/28/2011   Scleroderma ([place])    Screen for colon cancer 03/02/2019   Slow rate of speech 08/22/2015   Chronic from CVA.  She also has loss of left nasolabial fold and slight left facial droop/small asymmetry on the left side secondary to CVA   UTI (urinary tract infection) 05/07/2018    Past Surgical History:  Procedure Laterality Date   ABDOMINAL HYSTERECTOMY N/A 03/13/2015   Procedure: TOTAL HYSTERECTOMY ABDOMINAL BILATERAL SALPINGO OOPHORECTOMY RADICAL TUMOR [place];  Surgeon: Everitt Amber, MD;  Location: [facility];  Service: Gynecology;  Laterality: N/A;   BOWEL RESECTION  03/13/2015   Procedure: SMALL BOWEL RESECTION;  Surgeon: Everitt Amber, MD;  Location: [facility];  Service: Gynecology;;   [place] and 1984   COLONOSCOPY WITH PROPOFOL N/A 07/19/2020   Procedure: COLONOSCOPY WITH PROPOFOL;  Surgeon: Doran Stabler, MD;  Location: [facility];  Service: Gastroenterology;  Laterality: N/A;   COLOSTOMY  2008   COLOSTOMY TAKEDOWN     ESOPHAGOGASTRODUODENOSCOPY (EGD) WITH PROPOFOL N/A 12/21/2018   Procedure: ESOPHAGOGASTRODUODENOSCOPY (EGD) WITH PROPOFOL;  Surgeon: Doran Stabler, MD;  Location: [facility];  Service: Gastroenterology;  Laterality: N/A;   HEMOSTASIS CLIP PLACEMENT  07/19/2020   Procedure: HEMOSTASIS CLIP PLACEMENT;  Surgeon: Doran Stabler, MD;  Location: [facility];  Service: Gastroenterology;;   LAPAROTOMY N/A 03/13/2015   Procedure: EXPLORATORY LAPAROTOMY;  Surgeon: Everitt Amber, MD;  Location: [facility];  Service: Gynecology;  Laterality: N/A;   POLYPECTOMY  07/19/2020   Procedure: POLYPECTOMY;  Surgeon: Doran Stabler, MD;  Location: [facility];  Service: Gastroenterology;;   TONSILLECTOMY AND [place]  03/13/2015   Procedure: HERNIA REPAIR VENTRAL ADULT;  Surgeon: Everitt Amber, MD;  Location: [facility];  Service: Gynecology;;   Social History:  reports that she has never smoked. She has never used smokeless tobacco. She reports current alcohol use. She reports that she does not use drugs.  Allergies  Allergen Reactions   Penicillins Rash    Has patient had a PCN reaction causing immediate rash, facial/tongue/throat swelling, SOB or lightheadedness with hypotension: no Has patient had a PCN reaction  causing severe rash involving mucus membranes or skin necrosis: no Has patient had a PCN reaction that required hospitalization in the hospital at the time Has patient had a PCN reaction occurring within the last 10 years: no If all of the above answers are "NO", then may proceed with Cephalosporin use.     Family History  Problem Relation Age of Onset   Diabetes Mother    Hypertension Mother    Hyperlipidemia Mother    Colon polyps Mother        4 total   Fibroids Mother        s/p hysterectomy   Hyperlipidemia Father    Diabetes Daughter    Hodgkin's lymphoma Daughter 38   Asthma Maternal Aunt        severe - d. 24   Prostate cancer Maternal Uncle 21   Diabetes Paternal Aunt    Diabetes Paternal Uncle    Kidney failure Maternal Grandmother 84   Pancreatic cancer Paternal Grandmother        dx. >50   Diabetes Paternal Grandfather    Diabetes Paternal Aunt    Heart disease Neg Hx    Stroke Neg Hx    Colon  cancer Neg Hx    Stomach cancer Neg Hx    Esophageal cancer Neg Hx    Rectal cancer Neg Hx     Prior to Admission medications   Medication Sig Start Date End Date Taking? Authorizing Provider  acetaminophen (TYLENOL) 500 MG tablet Take 2 tablets (1,000 mg total) by mouth every 6 (six) hours as needed for moderate pain. 03/14/21   Alen Bleacher, MD  albuterol (PROVENTIL) (2.5 MG/3ML) 0.083% nebulizer solution Take 3 mLs (2.5 mg total) by nebulization every 6 (six) hours as needed for wheezing or shortness of breath. 12/23/21   Leeanne Rio, MD  albuterol (VENTOLIN HFA) 108 (90 Base) MCG/ACT inhaler INHALE 2 PUFFS INTO THE LUNGS EVERY 6 HOURS AS NEEDED FOR WHEEZING OR SHORTNESS OF BREATH 05/16/22   Leeanne Rio, MD  amLODipine (NORVASC) 5 MG tablet Take 1 tablet (5 mg total) by mouth daily. 03/05/22   Elouise Munroe, MD  B-D UF III MINI PEN NEEDLES 31G X 5 MM MISC  07/28/19   [provider]  baclofen (LIORESAL) 10 MG tablet TAKE 1 TABLET(10 MG) BY  MOUTH TWICE DAILY AS NEEDED FOR MUSCLE SPASMS 04/18/22   Leeanne Rio, MD  benzonatate (TESSALON) 100 MG capsule TAKE 1 CAPSULE(100 MG) BY MOUTH THREE TIMES DAILY AS NEEDED FOR COUGH 04/18/22   Leeanne Rio, MD  buPROPion ER [facility] SR) 100 MG 12 hr tablet Take 100 mg by mouth 2 (two) times daily. 12/09/21   [provider]  Cholecalciferol (VITAMIN D) 50 MCG (2000 UT) CAPS Take 8,000 Units by mouth daily.    [provider]  ciprofloxacin (CIPRO) 500 MG tablet Take 1 tablet (500 mg total) by mouth every 12 (twelve) hours. 05/30/22   Sherrill Raring, PA-C  diclofenac Sodium (VOLTAREN) 1 % GEL Apply to knee once daily as needed 05/26/22   Ganta, Anupa, DO  ezetimibe (ZETIA) 10 MG tablet Take 1 tablet (10 mg total) by mouth daily. 01/28/22 04/28/22  Ledora Bottcher, PA  fesoterodine (TOVIAZ) 8 MG TB24 tablet TAKE 1 TABLET(8 MG) BY MOUTH DAILY 04/22/22   Leeanne Rio, MD  fluticasone [facility] 50 MCG/ACT nasal spray SHAKE LIQUID AND USE 2 SPRAYS IN [facility] NOSTRIL DAILY 05/21/22   Leeanne Rio, MD  furosemide (LASIX) 20 MG tablet Take 20 mg by mouth daily as needed for edema.    [provider]  guaiFENesin (MUCINEX) 600 MG 12 hr tablet Take by mouth. 02/21/19   [provider]  Insulin Pen Needle 31G X 5 MM MISC Use as directed with Victoza 09/16/19   [provider]  lidocaine (LIDODERM) 5 % Place 1 patch onto the skin daily. Remove & Discard patch within 12 hours or as directed by MD 03/14/21   Alen Bleacher, MD  lidocaine-prilocaine (EMLA) cream Apply 1 application topically as needed (port access).    [provider]  liraglutide (VICTOZA) 18 MG/3ML SOPN Inject 1.8 mg into the skin daily. 07/21/20   Doreatha Lew, MD  metoprolol tartrate (LOPRESSOR) 50 MG tablet Take 1 tablet (50 mg total) by mouth once for 1 dose. PLEASE TAKE METOPROLOL 2  HOURS PRIOR TO CTA SCAN. 01/18/21 02/10/22  Elouise Munroe, MD  naltrexone  (DEPADE) 50 MG tablet Take 25 mg by mouth daily. 09/09/21   [provider]  omeprazole (PRILOSEC) 40 MG capsule Take 1 capsule (40 mg total) by mouth in the morning and at bedtime. 03/03/22   Noralyn Pick, NP  promethazine-dextromethorphan (PROMETHAZINE-DM) 6.25-15 MG/5ML syrup Take 5 mLs by mouth 4 (four) times daily as needed for cough. 11/12/21   White, Leitha Schuller, NP  Tiotropium Bromide-Olodaterol (STIOLTO RESPIMAT) 2.5-2.5 MCG/ACT AERS Inhale 2 puffs into the lungs daily. 06/05/22   Hunsucker, Bonna Gains, MD  triamcinolone cream (KENALOG) 0.1 % Apply 1 Application topically 2 (two) times daily as needed. APPLY TO THE AFFECTED AREA TWICE DAILY AS NEEDED FOR SKIN IRRITATION 01/09/22   Leeanne Rio, MD  valACYclovir (VALTREX) 500 MG tablet TAKE 1 TABLET(500 MG) BY MOUTH TWICE DAILY FOR 3 DAYS. START OF OUTBREAK 05/01/22   Leeanne Rio, MD  XARELTO 20 MG TABS tablet TAKE 1 TABLET(20 MG) BY MOUTH EVERY MORNING 05/16/22   Leeanne Rio, MD    Physical Exam: Vitals:   06/25/22 0810 06/25/22 0900 06/25/22 0902 06/25/22 0905  BP:   (!) 169/65   Pulse:  94 94 91  Resp: (!) 21 17 (!) 22 (!) 22  Temp:      TempSrc:      SpO2:  98% 98% 99%  Weight:    (!) 163.3 kg  Height:    5\' 5"  (1.651 m)   Physical Exam Constitutional:      General: She is awake. She is in acute distress.     Appearance: She is morbidly obese.     Interventions: Nasal cannula in place.  HENT:     Head: Normocephalic.     Nose: No rhinorrhea.     Mouth/Throat:     Mouth: Mucous membranes are dry.  Eyes:     General: No scleral icterus.    Pupils: Pupils are equal, round, and reactive to light.  Neck:     Vascular: No JVD.  Cardiovascular:     Rate and Rhythm: Regular rhythm. Tachycardia present.     Heart sounds: S1 normal and S2 normal.  Pulmonary:     Breath sounds: Wheezing present.  Abdominal:     General: Bowel sounds are normal. There is no distension.     Palpations:  Abdomen is soft.     Tenderness: There is no abdominal tenderness. There is no guarding.  Musculoskeletal:     Cervical back: Neck supple.     Right lower leg: Edema present.     Left lower leg: Edema present.  Skin:    General: Skin is warm and dry.  Neurological:     General: No focal deficit present.     Mental Status: She is alert and oriented to person, place, and time.  Psychiatric:        Mood and Affect: Mood normal.        Behavior: Behavior normal. Behavior is cooperative.   Data Reviewed:  Results are pending, will review when available.  10/11/2019 echocardiogram IMPRESSIONS:   1. Left ventricular ejection fraction, by estimation, is 60 to 65%. The  left ventricle has normal function. The left ventricle has no regional  wall motion abnormalities. There is mild left ventricular hypertrophy.  Left ventricular diastolic parameters  are consistent with Grade I diastolic dysfunction (impaired relaxation).   2. Right ventricular systolic function is normal. The right ventricular  size is normal.   3. The mitral valve is grossly normal. No evidence of mitral valve  regurgitation.   4. The aortic valve is tricuspid. Aortic valve regurgitation is not  visualized.   5. The inferior vena cava is normal in size with greater than 50%  respiratory variability, suggesting right atrial  pressure of 3 mmHg.   Assessment and Plan: Principal Problem:   Acute respiratory failure with hypoxia ([place]) In the setting of:   Multifocal pneumonia Secondary to:   Aspiration into respiratory tract  Admit to PCU/inpatient. Continue supplemental oxygen. Scheduled and as needed bronchodilators. Continue ceftriaxone 1 g IVPB daily. Continue azithromycin 500 mg IVPB daily. Check strep pneumoniae urinary antigen. Check sputum Gram stain, culture and sensitivity. Follow-up blood culture and sensitivity. Follow-up CBC and chemistry in the morning. PCCM consult appreciated.  Active  Problems:   OSA (obstructive sleep apnea) Hold CPAP due to aspiration.    Essential hypertension, benign Resume amlodipine 5 mg p.o. daily. Monitor blood pressure and heart rate.    GERD (gastroesophageal reflux disease) Continue home PPI or formulary equivalent.    Prediabetes Carbohydrate modified diet. CBG monitoring before meals and bedtime. RI SS coverage before meals.    Chronic diastolic CHF (congestive heart failure) (HCC) No significant signs of decompensation at this time. Continue furosemide as needed.     Advance Care Planning:   Code Status: Full Code   Consults: PCCM Ina Homes, MD)  Family Communication:   Severity of Illness: The appropriate patient status for this patient is INPATIENT. Inpatient status is judged to be reasonable and necessary in order to provide the required intensity of service to ensure the patient's safety. The patient's presenting symptoms, physical exam findings, and initial radiographic and laboratory data in the context of their chronic comorbidities is felt to place them at high risk for further clinical deterioration. Furthermore, it is not anticipated that the patient will be medically stable for discharge from the hospital within 2 midnights of admission.   * I certify that at the point of admission it is my clinical judgment that the patient will require inpatient hospital care spanning beyond 2 midnights from the point of admission due to high intensity of service, high risk for further deterioration and high frequency of surveillance required.*  Author: Reubin Milan, MD 06/25/2022 9:33 AM  For on call review [email or web address].   This document was prepared using Dragon voice recognition software and may contain some unintended transcription errors.

## 2022-06-25 NOTE — Consult Note (Addendum)
06/25/2022 Seen for recurrent aspiration. Has unfortunate combo of very symptomatic regurgitating hiatal hernia and excess weight.  Working on weight loss to be eligible to get a combined Nissen and surgical weight loss procedure.  Presents sudden onset SOB, cough c/w prior aspiration events. CXR read as edema but could just be ards from aspiration.  On 2-4LPM, nontoxic appearing, lungs diminished due to body habitus.  Treat as CAP, aspiration precautions and hopefully a short stay until she can get back on her weight loss journey as surgery will ultimately be needed to prevent this from recurring.  Will check on tomorrow to assure heading in right direction.  Dr. Silas Flood aware of admit.  Erskine Emery MD PCCM     NAME:  STAFANIE CASTANON, MRN:  [id number], DOB:  07/19/1965, LOS: 0 ADMISSION DATE:  06/25/2022, CONSULTATION DATE:  3/20 REFERRING MD:  Olevia Bowens, CHIEF COMPLAINT:  acute hypoxic respiratory failure    History of Present Illness:  57 year old female with multiple comorbidities as listed below.  Presented to the emergency room on 3/20 with chief complaint of shortness of breath, reported as fairly sudden onset associated with cough but no fever chest pain or lower extremity swelling admitted with a working diagnosis of recurrent aspiration pneumonia started on high flow oxygen, and broad-spectrum antibiotics. Pulmonary asked to evaluate  Pertinent  Medical History  Hiatal hernia, prior aspiration pneumonia, ovarian and endometrial cancer status post hysterectomy and chemotherapy, significant gastroesophageal reflux disease, childhood asthma, recurrent pulmonary embolus on chronic Xarelto.  Prior ventral hernia repair.  Morbid obesity, scleroderma, hypertension, hyperlipidemia.,  Prior CVA, has prediabetes, Port-A-Cath in place.,  Lupus, IBS Significant Hospital Events: Including procedures, antibiotic start and stop dates in addition to other pertinent events   3/30 admitted w/ acute  resp failure for asp PNA. Started on CAP coverage azith and ceftriaxone   Interim History / Subjective:  Feels a little better   Objective   Blood pressure (Abnormal) 168/59, pulse 93, temperature (Abnormal) 100.6 F (38.1 C), temperature source Oral, resp. rate (Abnormal) 23, height 5\' 5"  (1.651 m), weight (Abnormal) 163.3 kg, last menstrual period 10/16/2013, SpO2 98 %.    FiO2 (%):  [58 %] 58 %   Intake/Output Summary (Last 24 hours) at 06/25/2022 1043 Last data filed at 06/25/2022 [id number] Gross per 24 hour  Intake 250 ml  Output no documentation  Net 250 ml   Filed Weights   06/25/22 0445 06/25/22 0905  Weight: (Abnormal) 159.7 kg (Abnormal) 163.3 kg    Examination: General: obese 57 year old female resting in bed HENT: NCAT no JVD  Lungs: dec bases currently on 6 liters [place]  Cardiovascular: rrr no MRG Abdomen: soft + bowel sounds  Extremities: no edema  Neuro: awake and alert GU: voids  Resolved Hospital Problem list     Assessment & Plan:  Acute hypoxic resp failure Asp PNA Gastric reflux Hiatal hernia Obesity class III Scleroderma  HTN Recurrent PE (on DOAC) Lupus    Acute hypoxic respiratory failure 2/2 recurrent aspiration PNA in setting of hiatal hernia and gastric reflux Plan Cont abx (no dental issues so think we can hold off on flagyl) Ideally would place on unasyn but w/allergy to PCN will cont her current rx w. CTx and azith Cont supplemental oxygen  Pulm hygiene Reflux precautions (reviewed in detail w the patient)  She is eventually looking at surgical correction at [place] (needs to lose 50 more lbs) suspect that this will have a big impact on asp in future  OSA (very mild) apnea index 4.3 Plan Pulse ox at night Consider O2 at HS Would lean against CPAP given reflux risk and limited benefit w/ this AI   Best Practice (right click and "Reselect all SmartList Selections" daily)   Per primary   Labs   CBC: Recent Labs  Lab  06/25/22 0452  WBC 12.5*  HGB 10.4*  HCT 34.2*  MCV 84.4  PLT [id number]    Basic Metabolic Panel: Recent Labs  Lab 06/25/22 0452  NA 139  K 3.8  CL 106  CO2 22  GLUCOSE 147*  BUN 25*  CREATININE 1.00  CALCIUM 9.6   GFR: Estimated Creatinine Clearance: 97.5 mL/min (by C-G formula based on SCr of 1 mg/dL). Recent Labs  Lab 06/25/22 0452  WBC 12.5*    Liver Function Tests: No results for input(s): "AST", "ALT", "ALKPHOS", "BILITOT", "PROT", "ALBUMIN" in the last 168 hours. No results for input(s): "LIPASE", "AMYLASE" in the last 168 hours. No results for input(s): "AMMONIA" in the last 168 hours.  ABG    Component Value Date/Time   TCO2 28 04/09/2015 0112     Coagulation Profile: Recent Labs  Lab 06/25/22 0452  INR 1.4*    Cardiac Enzymes: No results for input(s): "CKTOTAL", "CKMB", "CKMBINDEX", "TROPONINI" in the last 168 hours.  HbA1C: Hgb A1c MFr Bld  Date/Time Value Ref Range Status  07/20/2020 06:05 AM 5.5 4.8 - 5.6 % Final    Comment:    (NOTE) Pre diabetes:          5.7%-6.4%  Diabetes:              >6.4%  Glycemic control for   <7.0% adults with diabetes   01/01/2018 10:10 AM 5.7 (H) 4.8 - 5.6 % Final    Comment:    (NOTE) Pre diabetes:          5.7%-6.4% Diabetes:              >6.4% Glycemic control for   <7.0% adults with diabetes     CBG: No results for input(s): "GLUCAP" in the last 168 hours.  Review of Systems:   Review of Systems  Constitutional:  Positive for fever and weight loss.  HENT: Negative.    Eyes: Negative.   Respiratory:  Positive for cough and shortness of breath.   Cardiovascular: Negative.   Gastrointestinal:  Positive for heartburn.  Genitourinary: Negative.   Musculoskeletal: Negative.   Skin: Negative.   Endo/Heme/Allergies: Negative.      Past Medical History:  She,  has a past medical history of Allergy, Anemia, Anxiety, Arthritis, Blood transfusion without reported diagnosis, Cough, Cough  (06/11/2018), Difficulty sleeping (06/11/2018), Diverticulitis with perforation (2010), DVT (deep vein thrombosis) in pregnancy (11/24/2016), DVT, lower extremity ([place]), Endometrial cancer ([place]), Enlarged lymph nodes (08/13/2017), Family history of adverse reaction to anesthesia, GERD (gastroesophageal reflux disease), Headache, Hemoptysis (04/22/2018), History of bronchitis, History of ear infections, Hospital discharge follow-up (03/02/2019), migraines, Hyperlipidemia, Hypertension, IBS (irritable bowel syndrome), Kidney infection, Lupus (systemic lupus erythematosus) ([place]) (02/2017), Morbid obesity ([place]), Neck pain (01/13/2019), Ovarian cancer ([place]) (dx'd 01/2015), PONV (postoperative nausea and vomiting), Port-A-Cath in place, Portacath in place (09/12/2015), Pre-diabetes, Pyelonephritis, Right knee pain (11/28/2011), Scleroderma ([place]), Screen for colon cancer (03/02/2019), Slow rate of speech (08/22/2015), and UTI (urinary tract infection) (05/07/2018).   Surgical History:   Past Surgical History:  Procedure Laterality Date   ABDOMINAL HYSTERECTOMY N/A 03/13/2015   Procedure: TOTAL HYSTERECTOMY ABDOMINAL BILATERAL SALPINGO OOPHORECTOMY RADICAL TUMOR [place];  Surgeon:  Everitt Amber, MD;  Location: [facility];  Service: Gynecology;  Laterality: N/A;   BOWEL RESECTION  03/13/2015   Procedure: SMALL BOWEL RESECTION;  Surgeon: Everitt Amber, MD;  Location: [facility];  Service: Gynecology;;   [place] and 1984   COLONOSCOPY WITH PROPOFOL N/A 07/19/2020   Procedure: COLONOSCOPY WITH PROPOFOL;  Surgeon: Doran Stabler, MD;  Location: [facility];  Service: Gastroenterology;  Laterality: N/A;   COLOSTOMY  2008   COLOSTOMY TAKEDOWN     ESOPHAGOGASTRODUODENOSCOPY (EGD) WITH PROPOFOL N/A 12/21/2018   Procedure: ESOPHAGOGASTRODUODENOSCOPY (EGD) WITH PROPOFOL;  Surgeon: Doran Stabler, MD;  Location: [facility];  Service: Gastroenterology;  Laterality: N/A;   HEMOSTASIS CLIP PLACEMENT  07/19/2020   Procedure:  HEMOSTASIS CLIP PLACEMENT;  Surgeon: Doran Stabler, MD;  Location: [facility];  Service: Gastroenterology;;   LAPAROTOMY N/A 03/13/2015   Procedure: EXPLORATORY LAPAROTOMY;  Surgeon: Everitt Amber, MD;  Location: [facility];  Service: Gynecology;  Laterality: N/A;   POLYPECTOMY  07/19/2020   Procedure: POLYPECTOMY;  Surgeon: Doran Stabler, MD;  Location: [facility];  Service: Gastroenterology;;   TONSILLECTOMY AND [place]  03/13/2015   Procedure: HERNIA REPAIR VENTRAL ADULT;  Surgeon: Everitt Amber, MD;  Location: [facility];  Service: Gynecology;;     Social History:   reports that she has never smoked. She has never used smokeless tobacco. She reports current alcohol use. She reports that she does not use drugs.   Family History:  Her family history includes Asthma in her maternal aunt; Colon polyps in her mother; Diabetes in her daughter, mother, paternal aunt, paternal aunt, paternal grandfather, and paternal uncle; Fibroids in her mother; Hodgkin's lymphoma (age of onset: 80) in her daughter; Hyperlipidemia in her father and mother; Hypertension in her mother; Kidney failure (age of onset: 81) in her maternal grandmother; Pancreatic cancer in her paternal grandmother; Prostate cancer (age of onset: 46) in her maternal uncle. There is no history of Heart disease, Stroke, Colon cancer, Stomach cancer, Esophageal cancer, or Rectal cancer.   Allergies Allergies  Allergen Reactions   Penicillins Rash    Has patient had a PCN reaction causing immediate rash, facial/tongue/throat swelling, SOB or lightheadedness with hypotension: no Has patient had a PCN reaction causing severe rash involving mucus membranes or skin necrosis: no Has patient had a PCN reaction that required hospitalization in the hospital at the time Has patient had a PCN reaction occurring within the last 10 years: no If all of the above answers are "NO", then may proceed with Cephalosporin  use.      Home Medications  Prior to Admission medications   Medication Sig Start Date End Date Taking? Authorizing Provider  acetaminophen (TYLENOL) 500 MG tablet Take 2 tablets (1,000 mg total) by mouth every 6 (six) hours as needed for moderate pain. 03/14/21   Alen Bleacher, MD  albuterol (PROVENTIL) (2.5 MG/3ML) 0.083% nebulizer solution Take 3 mLs (2.5 mg total) by nebulization every 6 (six) hours as needed for wheezing or shortness of breath. 12/23/21   Leeanne Rio, MD  albuterol (VENTOLIN HFA) 108 (90 Base) MCG/ACT inhaler INHALE 2 PUFFS INTO THE LUNGS EVERY 6 HOURS AS NEEDED FOR WHEEZING OR SHORTNESS OF BREATH 05/16/22   Leeanne Rio, MD  amLODipine (NORVASC) 5 MG tablet Take 1 tablet (5 mg total) by mouth daily. 03/05/22   Elouise Munroe, MD  B-D UF III MINI PEN NEEDLES 31G X  5 MM MISC  07/28/19   [provider]  baclofen (LIORESAL) 10 MG tablet TAKE 1 TABLET(10 MG) BY MOUTH TWICE DAILY AS NEEDED FOR MUSCLE SPASMS 04/18/22   Leeanne Rio, MD  benzonatate (TESSALON) 100 MG capsule TAKE 1 CAPSULE(100 MG) BY MOUTH THREE TIMES DAILY AS NEEDED FOR COUGH 04/18/22   Leeanne Rio, MD  buPROPion ER [facility] SR) 100 MG 12 hr tablet Take 100 mg by mouth 2 (two) times daily. 12/09/21   [provider]  Cholecalciferol (VITAMIN D) 50 MCG (2000 UT) CAPS Take 8,000 Units by mouth daily.    [provider]  ciprofloxacin (CIPRO) 500 MG tablet Take 1 tablet (500 mg total) by mouth every 12 (twelve) hours. 05/30/22   Sherrill Raring, PA-C  diclofenac Sodium (VOLTAREN) 1 % GEL Apply to knee once daily as needed 05/26/22   Ganta, Anupa, DO  ezetimibe (ZETIA) 10 MG tablet Take 1 tablet (10 mg total) by mouth daily. 01/28/22 04/28/22  Ledora Bottcher, PA  fesoterodine (TOVIAZ) 8 MG TB24 tablet TAKE 1 TABLET(8 MG) BY MOUTH DAILY 04/22/22   Leeanne Rio, MD  fluticasone [facility]) 50 MCG/ACT nasal spray SHAKE LIQUID AND USE 2 SPRAYS IN [facility] NOSTRIL  DAILY 05/21/22   Leeanne Rio, MD  furosemide (LASIX) 20 MG tablet Take 20 mg by mouth daily as needed for edema.    [provider]  guaiFENesin (MUCINEX) 600 MG 12 hr tablet Take by mouth. 02/21/19   [provider]  Insulin Pen Needle 31G X 5 MM MISC Use as directed with Victoza 09/16/19   [provider]  lidocaine (LIDODERM) 5 % Place 1 patch onto the skin daily. Remove & Discard patch within 12 hours or as directed by MD 03/14/21   Alen Bleacher, MD  lidocaine-prilocaine (EMLA) cream Apply 1 application topically as needed (port access).    [provider]  liraglutide (VICTOZA) 18 MG/3ML SOPN Inject 1.8 mg into the skin daily. 07/21/20   Doreatha Lew, MD  metoprolol tartrate (LOPRESSOR) 50 MG tablet Take 1 tablet (50 mg total) by mouth once for 1 dose. PLEASE TAKE METOPROLOL 2  HOURS PRIOR TO CTA SCAN. 01/18/21 02/10/22  Elouise Munroe, MD  naltrexone (DEPADE) 50 MG tablet Take 25 mg by mouth daily. 09/09/21   [provider]  omeprazole (PRILOSEC) 40 MG capsule Take 1 capsule (40 mg total) by mouth in the morning and at bedtime. 03/03/22   Noralyn Pick, NP  promethazine-dextromethorphan (PROMETHAZINE-DM) 6.25-15 MG/5ML syrup Take 5 mLs by mouth 4 (four) times daily as needed for cough. 11/12/21   White, Leitha Schuller, NP  Tiotropium Bromide-Olodaterol (STIOLTO RESPIMAT) 2.5-2.5 MCG/ACT AERS Inhale 2 puffs into the lungs daily. 06/05/22   Hunsucker, Bonna Gains, MD  triamcinolone cream (KENALOG) 0.1 % Apply 1 Application topically 2 (two) times daily as needed. APPLY TO THE AFFECTED AREA TWICE DAILY AS NEEDED FOR SKIN IRRITATION 01/09/22   Leeanne Rio, MD  valACYclovir (VALTREX) 500 MG tablet TAKE 1 TABLET(500 MG) BY MOUTH TWICE DAILY FOR 3 DAYS. START OF OUTBREAK 05/01/22   Leeanne Rio, MD  XARELTO 20 MG TABS tablet TAKE 1 TABLET(20 MG) BY MOUTH EVERY MORNING 05/16/22   Leeanne Rio, MD     Critical care time:  NA    Erick Colace ACNP-BC [facility] Pager # [phone] OR # [phone] if no answer

## 2022-06-26 ENCOUNTER — Ambulatory Visit: Payer: Medicare HMO | Admitting: Primary Care

## 2022-06-26 DIAGNOSIS — J9601 Acute respiratory failure with hypoxia: Secondary | ICD-10-CM | POA: Diagnosis not present

## 2022-06-26 LAB — CBC
HCT: 27.7 % — ABNORMAL LOW (ref 36.0–46.0)
Hemoglobin: 8.4 g/dL — ABNORMAL LOW (ref 12.0–15.0)
MCH: 26.1 pg (ref 26.0–34.0)
MCHC: 30.3 g/dL (ref 30.0–36.0)
MCV: 86 fL (ref 80.0–100.0)
Platelets: 191 10*3/uL (ref 150–400)
RBC: 3.22 MIL/uL — ABNORMAL LOW (ref 3.87–5.11)
RDW: 15.5 % (ref 11.5–15.5)
WBC: 9.1 10*3/uL (ref 4.0–10.5)
nRBC: 0 % (ref 0.0–0.2)

## 2022-06-26 LAB — COMPREHENSIVE METABOLIC PANEL
ALT: 9 U/L (ref 0–44)
AST: 13 U/L — ABNORMAL LOW (ref 15–41)
Albumin: 3.2 g/dL — ABNORMAL LOW (ref 3.5–5.0)
Alkaline Phosphatase: 65 U/L (ref 38–126)
Anion gap: 7 (ref 5–15)
BUN: 20 mg/dL (ref 6–20)
CO2: 25 mmol/L (ref 22–32)
Calcium: 8.7 mg/dL — ABNORMAL LOW (ref 8.9–10.3)
Chloride: 106 mmol/L (ref 98–111)
Creatinine, Ser: 1.06 mg/dL — ABNORMAL HIGH (ref 0.44–1.00)
GFR, Estimated: >60 mL/min (ref >60)
Glucose, Bld: 99 mg/dL (ref 70–99)
Potassium: 3.7 mmol/L (ref 3.5–5.1)
Sodium: 138 mmol/L (ref 135–145)
Total Bilirubin: 0.6 mg/dL (ref 0.3–1.2)
Total Protein: 6.1 g/dL — ABNORMAL LOW (ref 6.5–8.1)

## 2022-06-26 LAB — GLUCOSE, CAPILLARY
Glucose-Capillary: 99 mg/dL (ref 70–99)
Glucose-Capillary: 175 mg/dL — ABNORMAL HIGH (ref 70–99)
Glucose-Capillary: 77 mg/dL (ref 70–99)
Glucose-Capillary: 91 mg/dL (ref 70–99)

## 2022-06-26 LAB — HIV ANTIBODY (ROUTINE TESTING W REFLEX): HIV Screen 4th Generation wRfx: NONREACTIVE

## 2022-06-26 MED ORDER — HYDRALAZINE HCL 20 MG/ML IJ SOLN
5.0000 mg | Freq: Four times a day (QID) | INTRAMUSCULAR | Status: DC | PRN
  Administered 2022-06-26: 5 mg via INTRAVENOUS
  Filled 2022-06-26: qty 1

## 2022-06-26 MED ORDER — SODIUM CHLORIDE 0.9% FLUSH
10.0000 mL | Freq: Two times a day (BID) | INTRAVENOUS | Status: DC
  Administered 2022-06-26 – 2022-06-27 (×4): 10 mL

## 2022-06-26 MED ORDER — FESOTERODINE FUMARATE ER 8 MG PO TB24
8.0000 mg | ORAL_TABLET | Freq: Every day | ORAL | Status: DC
  Administered 2022-06-26 – 2022-06-27 (×2): 8 mg via ORAL
  Filled 2022-06-26 (×3): qty 1

## 2022-06-26 MED ORDER — GUAIFENESIN-DM 100-10 MG/5ML PO SYRP
5.0000 mL | ORAL_SOLUTION | ORAL | Status: DC | PRN
  Administered 2022-06-26: 5 mL via ORAL
  Filled 2022-06-26: qty 10

## 2022-06-26 MED ORDER — SODIUM CHLORIDE 0.9% FLUSH: 10.0000 mL | INTRAVENOUS | Status: DC | PRN

## 2022-06-26 NOTE — Progress Notes (Signed)
Patient ambulated approximately 60 feet in room and hallway with RN supervision on RA. Patient became tachypneic, with RR in 40's and O2 desat to 84%. Upon rest in room, patient's O2 returned to 92% on RA.

## 2022-06-26 NOTE — Progress Notes (Signed)
    NAME:  [name], MRN:  [id number], DOB:  [birth year], LOS: 1 ADMISSION DATE:  06/25/2022, CONSULTATION DATE:  3/20 REFERRING MD:  Olevia Bowens, CHIEF COMPLAINT:  acute hypoxic respiratory failure    History of Present Illness:  56 year old female with multiple comorbidities as listed below.  Presented to the emergency room on 3/20 with chief complaint of shortness of breath, reported as fairly sudden onset associated with cough but no fever chest pain or lower extremity swelling admitted with a working diagnosis of recurrent aspiration pneumonia started on high flow oxygen, and broad-spectrum antibiotics. Pulmonary asked to evaluate  Pertinent  Medical History  Hiatal hernia, prior aspiration pneumonia, ovarian and endometrial cancer status post hysterectomy and chemotherapy, significant gastroesophageal reflux disease, childhood asthma, recurrent pulmonary embolus on chronic Xarelto.  Prior ventral hernia repair.  Morbid obesity, scleroderma, hypertension, hyperlipidemia.,  Prior CVA, has prediabetes, Port-A-Cath in place.,  Lupus, IBS Significant Hospital Events: Including procedures, antibiotic start and stop dates in addition to other pertinent events   3/30 admitted w/ acute resp failure for asp PNA. Started on CAP coverage azith and ceftriaxone   Interim History / Subjective:  Feeling better, minimal O2 needs, able to get to bathroom.  Objective   Blood pressure (!) 167/68, pulse 88, temperature 98.2 F (36.8 C), temperature source Oral, resp. rate (!) 22, height 5\' 5"  (1.651 m), weight (!) 163.3 kg, last menstrual period 10/16/2013, SpO2 94 %.        Intake/Output Summary (Last 24 hours) at 06/26/2022 1316 Last data filed at 06/26/2022 [id number] Gross per 24 hour  Intake 865 ml  Output 600 ml  Net 265 ml    Filed Weights   06/25/22 0445 06/25/22 0905  Weight: (!) 159.7 kg (!) 163.3 kg    Examination: No distress Lungs clear Sats 95 on 2LPM Moves ext to command [employer]  blanket noted  Resolved Hospital Problem list     Assessment & Plan:  Acute hypoxic resp failure Asp PNA Gastric reflux Hiatal hernia Obesity class III Scleroderma  HTN Recurrent PE (on DOAC) Lupus    Acute hypoxic respiratory failure 2/2 recurrent aspiration PNA in setting of hiatal hernia and gastric reflux Plan See discussion yesterday, continue O2 wean, progressive mobility, abx, likely home soon depending on home O2 needs Given improvement will be available PRN, will send Dr. Silas Flood message as Hinda Lenis MD PCCM

## 2022-06-26 NOTE — Progress Notes (Signed)
PROGRESS NOTE    [name]  [id number] DOB: 04/09/1965 DOA: 06/25/2022 PCP: Leeanne Rio, MD    Brief Narrative: [name] is a 57 y.o. female with medical history significant of Allergy, anemia, anxiety, depression, osteoarthritis, cough, difficulty sleeping, diverticular-itis, DVT, endometrial cancer, headache, history of hemoptysis, history of bronchitis, history of ear infections, migraine headaches, hyperlipidemia, hypertension, IBS, pyelonephritis, lupus erythematosus, morbid obesity with a BMI of 59.91 kg/m, ovarian cancer, prediabetes, scleroderma, history of CVA with slow rate of speech, history of UTI who presented to the emergency department with acute onset of dyspnea with wheezing and hypoxia of 67% requiring HFNC at 10 LPM.  Patient thinks he may have aspirated.  No fever, chills or night sweats. No sore throat, rhinorrhea or hemoptysis.  No chest pain, palpitations, diaphoresis, PND, orthopnea or pitting edema of the lower extremities.  No appetite changes, abdominal pain, diarrhea, constipation, melena or hematochezia.  No flank pain, dysuria, frequency or hematuria.  No polyuria, polydipsia, polyphagia or blurred vision.  She was recently seen by pulmonology (Dr. Silas Flood) who recommended a sleep study.   Lab work: CBC showed white count 12.5, hemoglobin 10.4 g/dL platelets 260.  PT 16.6 and INR 1.4.  Troponin was normal x 2.  BNP 68.3 pg/mL.  Coronavirus, influenza and RSV PCR negative.  BMP showed a glucose of 147 and BUN of 25 mg/deciliter.  The rest of the BMP measurements were normal.   Imaging: Portable 1 view chest radiograph with increased central vascular prominence and basilar interstitial edema consistent with CHF or fluid overload.  Increased from prior hilar opacities which will be alveolar edema and/for pneumonia.  Atelectasis could appear similar.  CTA chest with no evidence of PE.  Extensive multilobar bilateral bronchopneumonia most severe  in the lower lobes of the lungs bilaterally. ED course: Initial vital signs were temperature 98.2 F, pulse 95, respirations 33, BP 170/92 mmHg O2 sat 67% on 15 L via nasal cannula the patient received ondansetron 4 mg IVP ceftriaxone and azithromycin.    Assessment & Plan:   Principal Problem:   Acute respiratory failure with hypoxia (HCC) Active Problems:   Essential hypertension, benign   GERD (gastroesophageal reflux disease)   OSA (obstructive sleep apnea)   Prediabetes   Chronic diastolic CHF (congestive heart failure) (HCC)   Multifocal pneumonia   Aspiration into respiratory tract   #1 acute hypoxic respiratory failure secondary to multifocal pneumonia likely secondary to aspiration secondary to hiatal hernia, obesity and reflux. She became very tachypneic and tachycardic with desaturation to 84% while walking in the room. Patient reports she had sleep study not too long ago and follows up with PCCM as an outpatient. Continue Rocephin azithromycin Urine strep pneumo negative Wbc 9.1 trending down   #2 history of recurrent pulmonary embolism on Xarelto  #3 hypertension on amlodipine 5 mg daily  #4 history of lupus outpatient f/u  #5 GERD on PPI   #6 chronic diastolic heart failure on Lasix as needed  Estimated body mass index is 59.91 kg/m as calculated from the following:   Height as of this encounter: 5\' 5"  (1.651 m).   Weight as of this encounter: 163.3 kg.  DVT prophylaxis: xarelto Code Status: full Family Communication: none Disposition Plan:  Status is: Inpatient Remains inpatient appropriate because: new hypoxia   Consultants:  pccm  Procedures:none Antimicrobials: none  Subjective: Feels better Became very tachypneic and hypoxic with ambulation  Objective: Vitals:   06/26/22 1100 06/26/22 1130 06/26/22 1200 06/26/22  1300  BP: (!) 177/69  (!) 179/89 (!) 167/68  Pulse: 86 89 88 88  Resp: (!) 21 (!) 21 17 (!) 22  Temp:      TempSrc:       SpO2: (!) 88% 98% 95% 94%  Weight:      Height:        Intake/Output Summary (Last 24 hours) at 06/26/2022 1356 Last data filed at 06/26/2022 [id number] Gross per 24 hour  Intake 865 ml  Output 600 ml  Net 265 ml   Filed Weights   06/25/22 0445 06/25/22 0905  Weight: (!) 159.7 kg (!) 163.3 kg    Examination:  General exam: Appears in nad  Respiratory system: rhonchi to auscultation. Respiratory effort normal. Cardiovascular system: S1 & S2 heard, RRR. No JVD, murmurs, rubs, gallops or clicks. No pedal edema. Gastrointestinal system: Abdomen is nondistended, soft and nontender. No organomegaly or masses felt. Normal bowel sounds heard. Central nervous system: Alert and oriented. No focal neurological deficits. Extremities: trace edema  Skin: No rashes, lesions or ulcers Psychiatry: Judgement and insight appear normal. Mood & affect appropriate.     Data Reviewed: I have personally reviewed following labs and imaging studies  CBC: Recent Labs  Lab 06/25/22 0452 06/26/22 0354  WBC 12.5* 9.1  HGB 10.4* 8.4*  HCT 34.2* 27.7*  MCV 84.4 86.0  PLT 260 [id number]   Basic Metabolic Panel: Recent Labs  Lab 06/25/22 0452 06/26/22 0354  NA 139 138  K 3.8 3.7  CL 106 106  CO2 22 25  GLUCOSE 147* 99  BUN 25* 20  CREATININE 1.00 1.06*  CALCIUM 9.6 8.7*   GFR: Estimated Creatinine Clearance: 92 mL/min (A) (by C-G formula based on SCr of 1.06 mg/dL (H)). Liver Function Tests: Recent Labs  Lab 06/26/22 0354  AST 13*  ALT 9  ALKPHOS 65  BILITOT 0.6  PROT 6.1*  ALBUMIN 3.2*   No results for input(s): "LIPASE", "AMYLASE" in the last 168 hours. No results for input(s): "AMMONIA" in the last 168 hours. Coagulation Profile: Recent Labs  Lab 06/25/22 0452  INR 1.4*   Cardiac Enzymes: No results for input(s): "CKTOTAL", "CKMB", "CKMBINDEX", "TROPONINI" in the last 168 hours. BNP (last 3 results) No results for input(s): "PROBNP" in the last 8760 hours. HbA1C: No results for  input(s): "HGBA1C" in the last 72 hours. CBG: Recent Labs  Lab 06/25/22 1309 06/25/22 1710 06/25/22 2120 06/26/22 0726 06/26/22 1224  GLUCAP 133* 125* 143* 99 175*   Lipid Profile: No results for input(s): "CHOL", "HDL", "LDLCALC", "TRIG", "CHOLHDL", "LDLDIRECT" in the last 72 hours. Thyroid Function Tests: No results for input(s): "TSH", "T4TOTAL", "FREET4", "T3FREE", "THYROIDAB" in the last 72 hours. Anemia Panel: No results for input(s): "VITAMINB12", "FOLATE", "FERRITIN", "TIBC", "IRON", "RETICCTPCT" in the last 72 hours. Sepsis Labs: No results for input(s): "PROCALCITON", "LATICACIDVEN" in the last 168 hours.  Recent Results (from the past 240 hour(s))  Resp panel by RT-PCR (RSV, Flu A&B, Covid) Anterior Nasal Swab     Status: None   Collection Time: 06/25/22  6:45 AM   Specimen: Anterior Nasal Swab  Result Value Ref Range Status   SARS Coronavirus 2 by RT PCR NEGATIVE NEGATIVE Final    Comment: (NOTE) SARS-CoV-2 target nucleic acids are NOT DETECTED.  The SARS-CoV-2 RNA is generally detectable in upper respiratory specimens during the acute phase of infection. The lowest concentration of SARS-CoV-2 viral copies this assay can detect is 138 copies/mL. A negative result does not preclude SARS-Cov-2 infection  and should not be used as the sole basis for treatment or other patient management decisions. A negative result may occur with  improper specimen collection/handling, submission of specimen other than nasopharyngeal swab, presence of viral mutation(s) within the areas targeted by this assay, and inadequate number of viral copies(<138 copies/mL). A negative result must be combined with clinical observations, patient history, and epidemiological information. The expected result is Negative.  Fact Sheet for Patients:  EntrepreneurPulse.com.au  Fact Sheet for Healthcare Providers:  IncredibleEmployment.be  This test is no t yet  approved or cleared by the [place] FDA and  has been authorized for detection and/or diagnosis of SARS-CoV-2 by FDA under an Emergency Use Authorization (EUA). This EUA will remain  in effect (meaning this test can be used) for the duration of the COVID-19 declaration under Section 564(b)(1) of the Act, 21 U.S.C.section 360bbb-3(b)(1), unless the authorization is terminated  or revoked sooner.       Influenza A by PCR NEGATIVE NEGATIVE Final   Influenza B by PCR NEGATIVE NEGATIVE Final    Comment: (NOTE) The Xpert Xpress SARS-CoV-2/FLU/RSV plus assay is intended as an aid in the diagnosis of influenza from Nasopharyngeal swab specimens and should not be used as a sole basis for treatment. Nasal washings and aspirates are unacceptable for Xpert Xpress SARS-CoV-2/FLU/RSV testing.  Fact Sheet for Patients: EntrepreneurPulse.com.au  Fact Sheet for Healthcare Providers: IncredibleEmployment.be  This test is not yet approved or cleared by the [place] FDA and has been authorized for detection and/or diagnosis of SARS-CoV-2 by FDA under an Emergency Use Authorization (EUA). This EUA will remain in effect (meaning this test can be used) for the duration of the COVID-19 declaration under Section 564(b)(1) of the Act, 21 U.S.C. section 360bbb-3(b)(1), unless the authorization is terminated or revoked.     Resp Syncytial Virus by PCR NEGATIVE NEGATIVE Final    Comment: (NOTE) Fact Sheet for Patients: EntrepreneurPulse.com.au  Fact Sheet for Healthcare Providers: IncredibleEmployment.be  This test is not yet approved or cleared by the [place] FDA and has been authorized for detection and/or diagnosis of SARS-CoV-2 by FDA under an Emergency Use Authorization (EUA). This EUA will remain in effect (meaning this test can be used) for the duration of the COVID-19 declaration under Section 564(b)(1) of  the Act, 21 U.S.C. section 360bbb-3(b)(1), unless the authorization is terminated or revoked.  Performed at [facility], [address] [place], [place] [address]   MRSA Next Gen by PCR, Nasal     Status: None   Collection Time: 06/25/22  9:02 AM   Specimen: Nasal Mucosa; Nasal Swab  Result Value Ref Range Status   MRSA by PCR Next Gen NOT DETECTED NOT DETECTED Final    Comment: (NOTE) The GeneXpert MRSA Assay (FDA approved for NASAL specimens only), is one component of a comprehensive MRSA colonization surveillance program. It is not intended to diagnose MRSA infection nor to guide or monitor treatment for MRSA infections. Test performance is not FDA approved in patients less than 52 years old. Performed at [facility], [place] [address], [place], [place] [address]   Respiratory (~20 pathogens) panel by PCR     Status: None   Collection Time: 06/25/22 10:23 AM   Specimen: Nasopharyngeal Swab; Respiratory  Result Value Ref Range Status   Adenovirus NOT DETECTED NOT DETECTED Final   Coronavirus 229E NOT DETECTED NOT DETECTED Final    Comment: (NOTE) The Coronavirus on the Respiratory Panel, DOES NOT test for the  novel  Coronavirus (2019 nCoV)    Coronavirus HKU1 NOT DETECTED NOT DETECTED Final   Coronavirus NL63 NOT DETECTED NOT DETECTED Final   Coronavirus OC43 NOT DETECTED NOT DETECTED Final   Metapneumovirus NOT DETECTED NOT DETECTED Final   Rhinovirus / Enterovirus NOT DETECTED NOT DETECTED Final   Influenza A NOT DETECTED NOT DETECTED Final   Influenza B NOT DETECTED NOT DETECTED Final   Parainfluenza Virus 1 NOT DETECTED NOT DETECTED Final   Parainfluenza Virus 2 NOT DETECTED NOT DETECTED Final   Parainfluenza Virus 3 NOT DETECTED NOT DETECTED Final   Parainfluenza Virus 4 NOT DETECTED NOT DETECTED Final   Respiratory Syncytial Virus NOT DETECTED NOT DETECTED Final   Bordetella pertussis NOT DETECTED NOT DETECTED Final    Bordetella Parapertussis NOT DETECTED NOT DETECTED Final   Chlamydophila pneumoniae NOT DETECTED NOT DETECTED Final   Mycoplasma pneumoniae NOT DETECTED NOT DETECTED Final    Comment: Performed at [facility], [place] [address], [place], [place] [address]         Radiology Studies: ECHOCARDIOGRAM COMPLETE  Result Date: 06/25/2022    ECHOCARDIOGRAM REPORT   Patient Name:   JONNI IBACH Date of Exam: 06/25/2022 Medical Rec #:  [id number]          Height:       65.0 in Accession #:    [id number]         Weight:       360.0 lb Date of Birth:  June 16, 1965          BSA:          2.540 m Patient Age:    31 years           BP:           130/55 mmHg Patient Gender: F                  HR:           84 bpm. Exam Location:  Inpatient Procedure: 2D Echo, Cardiac Doppler and Color Doppler Indications:    H/o PE, aspiration pnemonia  History:        Patient has prior history of Echocardiogram examinations.  Sonographer:    Marella Chimes Referring Phys: [id number] [place]  1. Left ventricular ejection fraction, by estimation, is 60 to 65%. The left ventricle has normal function. The left ventricle has no regional wall motion abnormalities. Left ventricular diastolic parameters are indeterminate.  2. Right ventricular systolic function is normal. The right ventricular size is normal. Tricuspid regurgitation signal is inadequate for assessing PA pressure.  3. The mitral valve is normal in structure. No evidence of mitral valve regurgitation. No evidence of mitral stenosis.  4. The aortic valve was not well visualized. Aortic valve regurgitation is not visualized. No aortic stenosis is present.  5. IVC not visualized. FINDINGS  Left Ventricle: Left ventricular ejection fraction, by estimation, is 60 to 65%. The left ventricle has normal function. The left ventricle has no regional wall motion abnormalities. The left ventricular internal cavity size was normal in size. There is  no left  ventricular hypertrophy. Left ventricular diastolic parameters are indeterminate. Right Ventricle: The right ventricular size is normal. No increase in right ventricular wall thickness. Right ventricular systolic function is normal. Tricuspid regurgitation signal is inadequate for assessing PA pressure. Left Atrium: Left atrial size was normal in size. Right Atrium: Right atrial size was normal in size. Pericardium: Trivial pericardial effusion is present.  Mitral Valve: The mitral valve is normal in structure. No evidence of mitral valve regurgitation. No evidence of mitral valve stenosis. Tricuspid Valve: The tricuspid valve is normal in structure. Tricuspid valve regurgitation is not demonstrated. Aortic Valve: The aortic valve was not well visualized. Aortic valve regurgitation is not visualized. No aortic stenosis is present. Pulmonic Valve: The pulmonic valve was normal in structure. Pulmonic valve regurgitation is not visualized. Aorta: The aortic root is normal in size and structure. Venous: The inferior vena cava was not well visualized. IAS/Shunts: No atrial level shunt detected by color flow Doppler. [employer] Electronically signed by Franki Monte Signature Date/Time: 06/25/2022/4:16:03 PM    Final    CT Angio Chest Pulmonary Embolism (PE) W or WO Contrast  Result Date: 06/25/2022 CLINICAL DATA:  57 year old female with history of shortness of breath, chills and hypoxia. EXAM: CT ANGIOGRAPHY CHEST WITH CONTRAST TECHNIQUE: Multidetector CT imaging of the chest was performed using the standard protocol during bolus administration of intravenous contrast. Multiplanar CT image reconstructions and MIPs were obtained to evaluate the vascular anatomy. RADIATION DOSE REDUCTION: This exam was performed according to the departmental dose-optimization program which includes automated exposure control, adjustment of the mA and/or kV according to patient size and/or use of iterative reconstruction  technique. CONTRAST:  167mL OMNIPAQUE IOHEXOL 350 MG/ML SOLN COMPARISON:  Cardiac CT 01/25/2021.  Chest CTA 07/19/2020. FINDINGS: Cardiovascular: There are no filling defects within the pulmonary arterial tree to suggest pulmonary embolism. Heart size is normal. There is no significant pericardial fluid, thickening or pericardial calcification. No atherosclerotic calcifications are noted in the thoracic aorta or the coronary arteries. Right internal jugular single-lumen Port-A-Cath with tip terminating in the distal superior vena cava. Mediastinum/Nodes: Multiple prominent borderline enlarged mediastinal lymph nodes are noted, nonspecific, but similar to prior studies, presumably benign. Moderate hiatal hernia. No axillary lymphadenopathy. Lungs/Pleura: Widespread but patchy areas of airspace consolidation scattered in a peribronchovascular distribution throughout the lungs bilaterally, most severe in the lower lobes of the lungs. No pleural effusions. Upper Abdomen: Unremarkable. Musculoskeletal: There are no aggressive appearing lytic or blastic lesions noted in the visualized portions of the skeleton. Review of the MIP images confirms the above findings. IMPRESSION: 1. No evidence of pulmonary embolism. 2. Extensive multilobar bilateral bronchopneumonia, most severe in the lower lobes of the lungs bilaterally, as above. Electronically Signed   By: Vinnie Langton M.D.   On: 06/25/2022 06:29   DG Chest Portable 1 View  Result Date: 06/25/2022 CLINICAL DATA:  Dyspnea, hemoptysis and shortness breath. EXAM: PORTABLE CHEST 1 VIEW COMPARISON:  PA and lateral 01/15/2022 FINDINGS: There is overlying monitor wiring. Stable right chest MediPort with IJ approach catheter in the distal SVC. The heart has mildly enlarged. There is increased central vascular prominence and basilar interstitial edema with small pleural effusions beginning to form. There are increased infrahilar opacities which could be due to alveolar  edema and/or pneumonia. Atelectasis could appear similar. No other focal lung opacity is seen. The mediastinum is normally outlined. There is mild thoracic levoscoliosis with degenerative changes. No acute osseous findings. IMPRESSION: 1. Increased central vascular prominence and basilar interstitial edema consistent with CHF or fluid overload. 2. Increased infrahilar opacities which could be alveolar edema and/or pneumonia. Atelectasis could appear similar. 3. Clinical correlation and radiographic follow-up recommended. Electronically Signed   By: Telford Nab M.D.   On: 06/25/2022 05:37     Scheduled Meds:  amLODipine  5 mg Oral Daily   arformoterol  15 mcg Nebulization BID  And   umeclidinium bromide  1 puff Inhalation Daily   Chlorhexidine Gluconate Cloth  6 each Topical Daily   fesoterodine  8 mg Oral QHS   guaiFENesin  600 mg Oral BID   insulin aspart  0-20 Units Subcutaneous TID WC   pantoprazole  40 mg Oral Daily   rivaroxaban  20 mg Oral Q supper   sodium chloride flush  10-40 mL Intracatheter Q12H   Continuous Infusions:  azithromycin Stopped (06/26/22 0703)   cefTRIAXone (ROCEPHIN)  IV Stopped (06/26/22 0902)     LOS: 1 day   Time spent: 62 min  Georgette Shell, MD 06/26/2022, 1:56 PM

## 2022-06-27 ENCOUNTER — Inpatient Hospital Stay (HOSPITAL_COMMUNITY): Payer: Medicare HMO

## 2022-06-27 DIAGNOSIS — J9601 Acute respiratory failure with hypoxia: Secondary | ICD-10-CM | POA: Diagnosis not present

## 2022-06-27 LAB — GLUCOSE, CAPILLARY
Glucose-Capillary: 95 mg/dL (ref 70–99)
Glucose-Capillary: 101 mg/dL — ABNORMAL HIGH (ref 70–99)
Glucose-Capillary: 98 mg/dL (ref 70–99)
Glucose-Capillary: 112 mg/dL — ABNORMAL HIGH (ref 70–99)

## 2022-06-27 LAB — HEMOGLOBIN A1C
Hgb A1c MFr Bld: 5.6 % (ref 4.8–5.6)
Mean Plasma Glucose: 114 mg/dL

## 2022-06-27 MED ORDER — ACETAMINOPHEN 325 MG PO TABS: 650.0000 mg | ORAL_TABLET | Freq: Four times a day (QID) | ORAL | Status: DC | PRN

## 2022-06-27 MED ORDER — ONDANSETRON HCL 4 MG PO TABS: 4.0000 mg | ORAL_TABLET | Freq: Four times a day (QID) | ORAL | 0 refills | Status: AC | PRN

## 2022-06-27 MED ORDER — FUROSEMIDE 10 MG/ML IJ SOLN
20.0000 mg | Freq: Every day | INTRAMUSCULAR | Status: DC
  Administered 2022-06-27: 20 mg via INTRAVENOUS
  Filled 2022-06-27 (×2): qty 2

## 2022-06-27 MED ORDER — EZETIMIBE 10 MG PO TABS: 10.0000 mg | ORAL_TABLET | Freq: Every day | ORAL | 0 refills | Status: DC

## 2022-06-27 MED ORDER — AZITHROMYCIN 250 MG PO TABS
500.0000 mg | ORAL_TABLET | Freq: Every day | ORAL | Status: DC
  Administered 2022-06-28: 500 mg via ORAL
  Filled 2022-06-27: qty 2

## 2022-06-27 MED ORDER — BUTALBITAL-APAP-CAFFEINE 50-325-40 MG PO TABS
1.0000 | ORAL_TABLET | Freq: Four times a day (QID) | ORAL | Status: AC | PRN
  Administered 2022-06-27 – 2022-06-28 (×2): 1 via ORAL
  Filled 2022-06-27 (×2): qty 1

## 2022-06-27 NOTE — TOC Initial Note (Signed)
Transition of Care [facility]) - Initial/Assessment Note    Patient Details  Name: [name] MRN: [id number] Date of Birth: [birth year]  [facility]) CM/SW Contact:    Dessa Phi, RN Phone Number: 06/27/2022, 3:59 PM  Clinical Narrative: From home. Monitor for d/c needs.                  Expected Discharge Plan: Home/Self Care Barriers to Discharge: Continued Medical Work up   Patient Goals and CMS Choice            Expected Discharge Plan and Services         Expected Discharge Date: 06/27/22                                    Prior Living Arrangements/Services                       Activities of Daily Living Home Assistive Devices/Equipment: None ADL Screening (condition at time of admission) Patient's cognitive ability adequate to safely complete daily activities?: Yes Is the patient deaf or have difficulty hearing?: No Does the patient have difficulty seeing, even when wearing glasses/contacts?: No Does the patient have difficulty concentrating, remembering, or making decisions?: No Patient able to express need for assistance with ADLs?: Yes Does the patient have difficulty dressing or bathing?: No Independently performs ADLs?: Yes (appropriate for developmental age) Does the patient have difficulty walking or climbing stairs?: No Weakness of Legs: None Weakness of Arms/Hands: None  Permission Sought/Granted                  Emotional Assessment              Admission diagnosis:  Acute respiratory failure with hypoxia ([place]) [J96.01] Multifocal pneumonia [J18.9] Patient Active Problem List   Diagnosis Date Noted   Acute respiratory failure with hypoxia ([place]) 06/25/2022   Aspiration into respiratory tract 06/25/2022   Injury of right foot 06/12/2022   Right knee pain 05/26/2022   Multifocal pneumonia 11/19/2021   Acute recurrent maxillary sinusitis 10/31/2021   Viral illness 06/05/2021   Chronic diastolic  CHF (congestive heart failure) ([place]) 07/19/2020   Bronchospasm 07/19/2020   Incontinence 03/17/2020   Herpes simplex virus (HSV) infection of buttock 01/05/2020   OSA (obstructive sleep apnea) 07/25/2019   Prediabetes 07/25/2019   Lateral epicondylitis of left elbow 01/13/2019   Normocytic anemia 05/29/2018   Scleroderma ([place]) 04/22/2018   Swelling of foot joint, right 04/08/2018   Positive ANA (antinuclear antibody) 09/23/2016   Eczema 01/27/2016   History of pulmonary embolism 09/12/2015   Long term current use of anticoagulant therapy 09/06/2015   Chemotherapy-induced peripheral neuropathy ([place]) 06/13/2015   CKD (chronic kidney disease) stage 3, GFR 30-59 ml/min ([place]) 05/12/2015   GERD (gastroesophageal reflux disease) 05/12/2015   Ovarian carcinosarcoma, right ([place]) 03/29/2015   Endometrial ca ([place]) 03/29/2015   Ventral hernia without obstruction or gangrene    Morbid obesity ([place]) 02/19/2015   Essential hypertension, benign    Stress at home 12/14/2014   Left knee pain 08/04/2013   Seasonal allergies 07/29/2013   PCP:  Leeanne Rio, MD Pharmacy:   [id number] - [place], [place] AT [address] [place] [address] Phone: [phone] Fax: [phone]     Social Determinants of Health (  SDOH) Social History: SDOH Screenings   Food Insecurity: No Food Insecurity (06/25/2022)  Housing: Low Risk  (06/25/2022)  Transportation Needs: No Transportation Needs (06/25/2022)  Utilities: Not At Risk (06/25/2022)  Depression (PHQ2-9): Low Risk  (06/11/2022)  Financial Resource Strain: High Risk (06/23/2018)  Stress: No Stress Concern Present (06/23/2018)  Tobacco Use: Low Risk  (06/25/2022)   SDOH Interventions:     Readmission Risk Interventions     No data to display

## 2022-06-27 NOTE — Progress Notes (Signed)
PROGRESS NOTE    [name]  [id number] DOB: 1965/07/20 DOA: 06/25/2022 PCP: Leeanne Rio, MD    Brief Narrative: [name] is a 57 y.o. female with medical history significant of Allergy, anemia, anxiety, depression, osteoarthritis, cough, difficulty sleeping, diverticular-itis, DVT, endometrial cancer, headache, history of hemoptysis, history of bronchitis, history of ear infections, migraine headaches, hyperlipidemia, hypertension, IBS, pyelonephritis, lupus erythematosus, morbid obesity with a BMI of 59.91 kg/m, ovarian cancer, prediabetes, scleroderma, history of CVA with slow rate of speech, history of UTI who presented to the emergency department with acute onset of dyspnea with wheezing and hypoxia of 67% requiring HFNC at 10 LPM.  Patient thinks he may have aspirated.  No fever, chills or night sweats. No sore throat, rhinorrhea or hemoptysis.  No chest pain, palpitations, diaphoresis, PND, orthopnea or pitting edema of the lower extremities.  No appetite changes, abdominal pain, diarrhea, constipation, melena or hematochezia.  No flank pain, dysuria, frequency or hematuria.  No polyuria, polydipsia, polyphagia or blurred vision.  She was recently seen by pulmonology (Dr. Silas Flood) who recommended a sleep study.   Lab work: CBC showed white count 12.5, hemoglobin 10.4 g/dL platelets 260.  PT 16.6 and INR 1.4.  Troponin was normal x 2.  BNP 68.3 pg/mL.  Coronavirus, influenza and RSV PCR negative.  BMP showed a glucose of 147 and BUN of 25 mg/deciliter.  The rest of the BMP measurements were normal.   Imaging: Portable 1 view chest radiograph with increased central vascular prominence and basilar interstitial edema consistent with CHF or fluid overload.  Increased from prior hilar opacities which will be alveolar edema and/for pneumonia.  Atelectasis could appear similar.  CTA chest with no evidence of PE.  Extensive multilobar bilateral bronchopneumonia most severe  in the lower lobes of the lungs bilaterally. ED course: Initial vital signs were temperature 98.2 F, pulse 95, respirations 33, BP 170/92 mmHg O2 sat 67% on 15 L via nasal cannula the patient received ondansetron 4 mg IVP ceftriaxone and azithromycin.    Assessment & Plan:   Principal Problem:   Acute respiratory failure with hypoxia (HCC) Active Problems:   Essential hypertension, benign   GERD (gastroesophageal reflux disease)   OSA (obstructive sleep apnea)   Prediabetes   Chronic diastolic CHF (congestive heart failure) (HCC)   Multifocal pneumonia   Aspiration into respiratory tract   #1 acute hypoxic respiratory failure secondary to multifocal pneumonia likely secondary to aspiration secondary to hiatal hernia, obesity and reflux. She became very tachypneic and tachycardic with desaturation to 86% while walking today. Patient reports she had sleep study not too long ago and follows up with PCCM as an outpatient. Continue Rocephin azithromycin Urine strep pneumo negative Wbc 9.1 trending down   #2 history of recurrent pulmonary embolism on Xarelto  #3 hypertension on amlodipine 5 mg daily  #4 history of lupus outpatient f/u  #5 GERD on PPI   #6 chronic diastolic heart failure restart lasix iv Estimated body mass index is 59.91 kg/m as calculated from the following:   Height as of this encounter: 5\' 5"  (1.651 m).   Weight as of this encounter: 163.3 kg.  DVT prophylaxis: xarelto Code Status: full Family Communication: none Disposition Plan:  Status is: Inpatient Remains inpatient appropriate because: new hypoxia   Consultants:  pccm  Procedures:none Antimicrobials: none  Subjective:  Hypoxic with ambulation to 86 %  Objective: Vitals:   06/27/22 0851 06/27/22 0926 06/27/22 0931 06/27/22 1059  BP:   Marland Kitchen)  165/72 (!) 153/79  Pulse:  85  84  Resp:  18  18  Temp: 98.4 F (36.9 C)   97.7 F (36.5 C)  TempSrc: Oral   Oral  SpO2:  94%    Weight:       Height:        Intake/Output Summary (Last 24 hours) at 06/27/2022 1406 Last data filed at 06/27/2022 0900 Gross per 24 hour  Intake 250 ml  Output --  Net 250 ml    Filed Weights   06/25/22 0445 06/25/22 0905  Weight: (!) 159.7 kg (!) 163.3 kg    Examination:  General exam: Appears in nad  Respiratory system: rhonchi to auscultation. Respiratory effort normal. Cardiovascular system: S1 & S2 heard, RRR. No JVD, murmurs, rubs, gallops or clicks. No pedal edema. Gastrointestinal system: Abdomen is nondistended, soft and nontender. No organomegaly or masses felt. Normal bowel sounds heard. Central nervous system: Alert and oriented. No focal neurological deficits. Extremities: trace edema  Skin: No rashes, lesions or ulcers Psychiatry: Judgement and insight appear normal. Mood & affect appropriate.     Data Reviewed: I have personally reviewed following labs and imaging studies  CBC: Recent Labs  Lab 06/25/22 0452 06/26/22 0354  WBC 12.5* 9.1  HGB 10.4* 8.4*  HCT 34.2* 27.7*  MCV 84.4 86.0  PLT 260 [id number]    Basic Metabolic Panel: Recent Labs  Lab 06/25/22 0452 06/26/22 0354  NA 139 138  K 3.8 3.7  CL 106 106  CO2 22 25  GLUCOSE 147* 99  BUN 25* 20  CREATININE 1.00 1.06*  CALCIUM 9.6 8.7*    GFR: Estimated Creatinine Clearance: 92 mL/min (A) (by C-G formula based on SCr of 1.06 mg/dL (H)). Liver Function Tests: Recent Labs  Lab 06/26/22 0354  AST 13*  ALT 9  ALKPHOS 65  BILITOT 0.6  PROT 6.1*  ALBUMIN 3.2*    No results for input(s): "LIPASE", "AMYLASE" in the last 168 hours. No results for input(s): "AMMONIA" in the last 168 hours. Coagulation Profile: Recent Labs  Lab 06/25/22 0452  INR 1.4*    Cardiac Enzymes: No results for input(s): "CKTOTAL", "CKMB", "CKMBINDEX", "TROPONINI" in the last 168 hours. BNP (last 3 results) No results for input(s): "PROBNP" in the last 8760 hours. HbA1C: Recent Labs    06/26/22 0354  HGBA1C 5.6    CBG: Recent Labs  Lab 06/26/22 1224 06/26/22 1650 06/26/22 2210 06/27/22 0759 06/27/22 1115  GLUCAP 175* 77 91 95 101*    Lipid Profile: No results for input(s): "CHOL", "HDL", "LDLCALC", "TRIG", "CHOLHDL", "LDLDIRECT" in the last 72 hours. Thyroid Function Tests: No results for input(s): "TSH", "T4TOTAL", "FREET4", "T3FREE", "THYROIDAB" in the last 72 hours. Anemia Panel: No results for input(s): "VITAMINB12", "FOLATE", "FERRITIN", "TIBC", "IRON", "RETICCTPCT" in the last 72 hours. Sepsis Labs: No results for input(s): "PROCALCITON", "LATICACIDVEN" in the last 168 hours.  Recent Results (from the past 240 hour(s))  Resp panel by RT-PCR (RSV, Flu A&B, Covid) Anterior Nasal Swab     Status: None   Collection Time: 06/25/22  6:45 AM   Specimen: Anterior Nasal Swab  Result Value Ref Range Status   SARS Coronavirus 2 by RT PCR NEGATIVE NEGATIVE Final    Comment: (NOTE) SARS-CoV-2 target nucleic acids are NOT DETECTED.  The SARS-CoV-2 RNA is generally detectable in upper respiratory specimens during the acute phase of infection. The lowest concentration of SARS-CoV-2 viral copies this assay can detect is 138 copies/mL. A negative result does not preclude  SARS-Cov-2 infection and should not be used as the sole basis for treatment or other patient management decisions. A negative result may occur with  improper specimen collection/handling, submission of specimen other than nasopharyngeal swab, presence of viral mutation(s) within the areas targeted by this assay, and inadequate number of viral copies(<138 copies/mL). A negative result must be combined with clinical observations, patient history, and epidemiological information. The expected result is Negative.  Fact Sheet for Patients:  EntrepreneurPulse.com.au  Fact Sheet for Healthcare Providers:  IncredibleEmployment.be  This test is no t yet approved or cleared by the [place]  FDA and  has been authorized for detection and/or diagnosis of SARS-CoV-2 by FDA under an Emergency Use Authorization (EUA). This EUA will remain  in effect (meaning this test can be used) for the duration of the COVID-19 declaration under Section 564(b)(1) of the Act, 21 U.S.C.section 360bbb-3(b)(1), unless the authorization is terminated  or revoked sooner.       Influenza A by PCR NEGATIVE NEGATIVE Final   Influenza B by PCR NEGATIVE NEGATIVE Final    Comment: (NOTE) The Xpert Xpress SARS-CoV-2/FLU/RSV plus assay is intended as an aid in the diagnosis of influenza from Nasopharyngeal swab specimens and should not be used as a sole basis for treatment. Nasal washings and aspirates are unacceptable for Xpert Xpress SARS-CoV-2/FLU/RSV testing.  Fact Sheet for Patients: EntrepreneurPulse.com.au  Fact Sheet for Healthcare Providers: IncredibleEmployment.be  This test is not yet approved or cleared by the [place] FDA and has been authorized for detection and/or diagnosis of SARS-CoV-2 by FDA under an Emergency Use Authorization (EUA). This EUA will remain in effect (meaning this test can be used) for the duration of the COVID-19 declaration under Section 564(b)(1) of the Act, 21 U.S.C. section 360bbb-3(b)(1), unless the authorization is terminated or revoked.     Resp Syncytial Virus by PCR NEGATIVE NEGATIVE Final    Comment: (NOTE) Fact Sheet for Patients: EntrepreneurPulse.com.au  Fact Sheet for Healthcare Providers: IncredibleEmployment.be  This test is not yet approved or cleared by the [place] FDA and has been authorized for detection and/or diagnosis of SARS-CoV-2 by FDA under an Emergency Use Authorization (EUA). This EUA will remain in effect (meaning this test can be used) for the duration of the COVID-19 declaration under Section 564(b)(1) of the Act, 21 U.S.C. section  360bbb-3(b)(1), unless the authorization is terminated or revoked.  Performed at [facility], [address] [place], [place] [address]   MRSA Next Gen by PCR, Nasal     Status: None   Collection Time: 06/25/22  9:02 AM   Specimen: Nasal Mucosa; Nasal Swab  Result Value Ref Range Status   MRSA by PCR Next Gen NOT DETECTED NOT DETECTED Final    Comment: (NOTE) The GeneXpert MRSA Assay (FDA approved for NASAL specimens only), is one component of a comprehensive MRSA colonization surveillance program. It is not intended to diagnose MRSA infection nor to guide or monitor treatment for MRSA infections. Test performance is not FDA approved in patients less than 24 years old. Performed at [facility], [place] [address], [place], [place] [address]   Respiratory (~20 pathogens) panel by PCR     Status: None   Collection Time: 06/25/22 10:23 AM   Specimen: Nasopharyngeal Swab; Respiratory  Result Value Ref Range Status   Adenovirus NOT DETECTED NOT DETECTED Final   Coronavirus 229E NOT DETECTED NOT DETECTED Final    Comment: (NOTE) The Coronavirus on the Respiratory Panel, DOES NOT test  for the novel  Coronavirus (2019 nCoV)    Coronavirus HKU1 NOT DETECTED NOT DETECTED Final   Coronavirus NL63 NOT DETECTED NOT DETECTED Final   Coronavirus OC43 NOT DETECTED NOT DETECTED Final   Metapneumovirus NOT DETECTED NOT DETECTED Final   Rhinovirus / Enterovirus NOT DETECTED NOT DETECTED Final   Influenza A NOT DETECTED NOT DETECTED Final   Influenza B NOT DETECTED NOT DETECTED Final   Parainfluenza Virus 1 NOT DETECTED NOT DETECTED Final   Parainfluenza Virus 2 NOT DETECTED NOT DETECTED Final   Parainfluenza Virus 3 NOT DETECTED NOT DETECTED Final   Parainfluenza Virus 4 NOT DETECTED NOT DETECTED Final   Respiratory Syncytial Virus NOT DETECTED NOT DETECTED Final   Bordetella pertussis NOT DETECTED NOT DETECTED Final   Bordetella Parapertussis NOT  DETECTED NOT DETECTED Final   Chlamydophila pneumoniae NOT DETECTED NOT DETECTED Final   Mycoplasma pneumoniae NOT DETECTED NOT DETECTED Final    Comment: Performed at [facility], [place] [address] [place], [place] [address]         Radiology Studies: ECHOCARDIOGRAM COMPLETE  Result Date: 06/25/2022    ECHOCARDIOGRAM REPORT   Patient Name:   MELAINE MACINTOSH Date of Exam: 06/25/2022 Medical Rec #:  [id number]          Height:       65.0 in Accession #:    [id number]         Weight:       360.0 lb Date of Birth:  1965/09/05          BSA:          2.540 m Patient Age:    68 years           BP:           130/55 mmHg Patient Gender: F                  HR:           84 bpm. Exam Location:  Inpatient Procedure: 2D Echo, Cardiac Doppler and Color Doppler Indications:    H/o PE, aspiration pnemonia  History:        Patient has prior history of Echocardiogram examinations.  Sonographer:    Marella Chimes Referring Phys: [phone] [place]  1. Left ventricular ejection fraction, by estimation, is 60 to 65%. The left ventricle has normal function. The left ventricle has no regional wall motion abnormalities. Left ventricular diastolic parameters are indeterminate.  2. Right ventricular systolic function is normal. The right ventricular size is normal. Tricuspid regurgitation signal is inadequate for assessing PA pressure.  3. The mitral valve is normal in structure. No evidence of mitral valve regurgitation. No evidence of mitral stenosis.  4. The aortic valve was not well visualized. Aortic valve regurgitation is not visualized. No aortic stenosis is present.  5. IVC not visualized. FINDINGS  Left Ventricle: Left ventricular ejection fraction, by estimation, is 60 to 65%. The left ventricle has normal function. The left ventricle has no regional wall motion abnormalities. The left ventricular internal cavity size was normal in size. There is  no left ventricular hypertrophy. Left  ventricular diastolic parameters are indeterminate. Right Ventricle: The right ventricular size is normal. No increase in right ventricular wall thickness. Right ventricular systolic function is normal. Tricuspid regurgitation signal is inadequate for assessing PA pressure. Left Atrium: Left atrial size was normal in size. Right Atrium: Right atrial size was normal in size. Pericardium: Trivial pericardial effusion  is present. Mitral Valve: The mitral valve is normal in structure. No evidence of mitral valve regurgitation. No evidence of mitral valve stenosis. Tricuspid Valve: The tricuspid valve is normal in structure. Tricuspid valve regurgitation is not demonstrated. Aortic Valve: The aortic valve was not well visualized. Aortic valve regurgitation is not visualized. No aortic stenosis is present. Pulmonic Valve: The pulmonic valve was normal in structure. Pulmonic valve regurgitation is not visualized. Aorta: The aortic root is normal in size and structure. Venous: The inferior vena cava was not well visualized. IAS/Shunts: No atrial level shunt detected by color flow Doppler. [employer] Electronically signed by Franki Monte Signature Date/Time: 06/25/2022/4:16:03 PM    Final      Scheduled Meds:  amLODipine  5 mg Oral Daily   arformoterol  15 mcg Nebulization BID   And   umeclidinium bromide  1 puff Inhalation Daily   [START ON 06/28/2022] azithromycin  500 mg Oral Daily   Chlorhexidine Gluconate Cloth  6 each Topical Daily   fesoterodine  8 mg Oral QHS   guaiFENesin  600 mg Oral BID   insulin aspart  0-20 Units Subcutaneous TID WC   pantoprazole  40 mg Oral Daily   rivaroxaban  20 mg Oral Q supper   sodium chloride flush  10-40 mL Intracatheter Q12H   Continuous Infusions:  cefTRIAXone (ROCEPHIN)  IV Stopped (06/27/22 1235)     LOS: 2 days   Time spent: 39 min  Georgette Shell, MD 06/27/2022, 2:06 PM

## 2022-06-27 NOTE — Progress Notes (Addendum)
SATURATION QUALIFICATIONS:   Patient Saturations on Room Air at Rest = 91%  Patient Saturations on Room Air while Ambulating = 88%

## 2022-06-27 NOTE — Plan of Care (Signed)
  Problem: Clinical Measurements: Goal: Diagnostic test results will improve Outcome: Adequate for Discharge Goal: Cardiovascular complication will be avoided Outcome: Adequate for Discharge   Problem: Activity: Goal: Risk for activity intolerance will decrease Outcome: Adequate for Discharge   Problem: Coping: Goal: Level of anxiety will decrease Outcome: Adequate for Discharge   Problem: Elimination: Goal: Will not experience complications related to bowel motility Outcome: Adequate for Discharge Goal: Will not experience complications related to urinary retention Outcome: Adequate for Discharge   Problem: Pain Managment: Goal: General experience of comfort will improve Outcome: Adequate for Discharge   Problem: Safety: Goal: Ability to remain free from injury will improve Outcome: Adequate for Discharge   Problem: Skin Integrity: Goal: Risk for impaired skin integrity will decrease Outcome: Adequate for Discharge   Problem: Education: Goal: Knowledge of General Education information will improve Description: Including pain rating scale, medication(s)/side effects and non-pharmacologic comfort measures Outcome: Completed/Met

## 2022-06-28 DIAGNOSIS — J9601 Acute respiratory failure with hypoxia: Secondary | ICD-10-CM | POA: Diagnosis not present

## 2022-06-28 LAB — CBC
HCT: 27.1 % — ABNORMAL LOW (ref 36.0–46.0)
Hemoglobin: 8.3 g/dL — ABNORMAL LOW (ref 12.0–15.0)
MCH: 26.3 pg (ref 26.0–34.0)
MCHC: 30.6 g/dL (ref 30.0–36.0)
MCV: 86 fL (ref 80.0–100.0)
Platelets: 216 10*3/uL (ref 150–400)
RBC: 3.15 MIL/uL — ABNORMAL LOW (ref 3.87–5.11)
RDW: 15.3 % (ref 11.5–15.5)
WBC: 8.7 10*3/uL (ref 4.0–10.5)
nRBC: 0.2 % (ref 0.0–0.2)

## 2022-06-28 LAB — COMPREHENSIVE METABOLIC PANEL
ALT: 10 U/L (ref 0–44)
AST: 15 U/L (ref 15–41)
Albumin: 3.1 g/dL — ABNORMAL LOW (ref 3.5–5.0)
Alkaline Phosphatase: 69 U/L (ref 38–126)
Anion gap: 7 (ref 5–15)
BUN: 18 mg/dL (ref 6–20)
CO2: 26 mmol/L (ref 22–32)
Calcium: 8.7 mg/dL — ABNORMAL LOW (ref 8.9–10.3)
Chloride: 105 mmol/L (ref 98–111)
Creatinine, Ser: 1.14 mg/dL — ABNORMAL HIGH (ref 0.44–1.00)
GFR, Estimated: 56 mL/min — ABNORMAL LOW (ref >60)
Glucose, Bld: 104 mg/dL — ABNORMAL HIGH (ref 70–99)
Potassium: 3.6 mmol/L (ref 3.5–5.1)
Sodium: 138 mmol/L (ref 135–145)
Total Bilirubin: 0.6 mg/dL (ref 0.3–1.2)
Total Protein: 6.3 g/dL — ABNORMAL LOW (ref 6.5–8.1)

## 2022-06-28 LAB — GLUCOSE, CAPILLARY
Glucose-Capillary: 96 mg/dL (ref 70–99)
Glucose-Capillary: 104 mg/dL — ABNORMAL HIGH (ref 70–99)

## 2022-06-28 MED ORDER — CEFDINIR 300 MG PO CAPS: 300.0000 mg | ORAL_CAPSULE | Freq: Two times a day (BID) | ORAL | 0 refills | Status: DC

## 2022-06-28 MED ORDER — CEFDINIR 300 MG PO CAPS: 300.0000 mg | ORAL_CAPSULE | Freq: Two times a day (BID) | ORAL | Status: DC

## 2022-06-28 NOTE — Progress Notes (Signed)
Patient to be discharged to home today. Patient given discharge instructions including all discharge Medications and schedules for these Medications. Patient verbalized understanding of all discharge teaching. Discharge AVS with the Patient at discharge

## 2022-06-28 NOTE — Progress Notes (Addendum)
SATURATION QUALIFICATIONS: (This note is used to comply with regulatory documentation for home oxygen)  Patient Saturations on Room Air at Rest = 91%  Patient Saturations on Room Air while Ambulating = 84%  Patient Saturations on 2 Liters of oxygen while Ambulating = 95%  Please briefly explain why patient needs home oxygen:Patient with oxygen desaturations while ambulating on room air

## 2022-06-28 NOTE — Discharge Summary (Signed)
Physician Discharge Summary  KIARRAH DEPRIEST [id number] DOB: 08-08-1965 DOA: 06/25/2022  PCP: Leeanne Rio, MD  Admit date: 06/25/2022 Discharge date: 06/28/2022  Admitted From: Home Disposition: Home   Recommendations for Outpatient Follow-up:  Follow up with PCP in 1-2 weeks Please obtain BMP/CBC in one week Please follow up with DR Hunsucker  Home Health: None Equipment/Devices: Oxygen Discharge Condition: Stable CODE STATUS: Full code Diet recommendation: Cardiac Brief/Interim Summary:  [name] is a 57 y.o. female with medical history significant of Allergy, anemia, anxiety, depression, osteoarthritis, cough, difficulty sleeping, diverticular-itis, DVT, endometrial cancer, headache, history of hemoptysis, history of bronchitis, history of ear infections, migraine headaches, hyperlipidemia, hypertension, IBS, pyelonephritis, lupus erythematosus, morbid obesity with a BMI of 59.91 kg/m, ovarian cancer, prediabetes, scleroderma, history of CVA with slow rate of speech, history of UTI who presented to the emergency department with acute onset of dyspnea with wheezing and hypoxia of 67% requiring HFNC at 10 LPM.  Patient thinks he may have aspirated.  No fever, chills or night sweats. No sore throat, rhinorrhea or hemoptysis.  No chest pain, palpitations, diaphoresis, PND, orthopnea or pitting edema of the lower extremities.  No appetite changes, abdominal pain, diarrhea, constipation, melena or hematochezia.  No flank pain, dysuria, frequency or hematuria.  No polyuria, polydipsia, polyphagia or blurred vision.  She was recently seen by pulmonology (Dr. Silas Flood) who recommended a sleep study.   Lab work: CBC showed white count 12.5, hemoglobin 10.4 g/dL platelets 260.  PT 16.6 and INR 1.4.  Troponin was normal x 2.  BNP 68.3 pg/mL.  Coronavirus, influenza and RSV PCR negative.  BMP showed a glucose of 147 and BUN of 25 mg/deciliter.  The rest of the BMP measurements  were normal.   Imaging: Portable 1 view chest radiograph with increased central vascular prominence and basilar interstitial edema consistent with CHF or fluid overload.  Increased from prior hilar opacities which will be alveolar edema and/for pneumonia.  Atelectasis could appear similar.  CTA chest with no evidence of PE.  Extensive multilobar bilateral bronchopneumonia most severe in the lower lobes of the lungs bilaterally. ED course: Initial vital signs were temperature 98.2 F, pulse 95, respirations 33, BP 170/92 mmHg O2 sat 67% on 15 L via nasal cannula the patient received ondansetron 4 mg IVP ceftriaxone and azithromycin.   Discharge Diagnoses:  Principal Problem:   Acute respiratory failure with hypoxia (HCC) Active Problems:   Essential hypertension, benign   GERD (gastroesophageal reflux disease)   OSA (obstructive sleep apnea)   Prediabetes   Chronic diastolic CHF (congestive heart failure) (HCC)   Multifocal pneumonia   Aspiration into respiratory tract     #1 acute hypoxic respiratory failure secondary to multifocal pneumonia likely secondary to aspiration secondary to hiatal hernia, obesity and reflux.  She was treated with Rocephin and azithromycin. She desaturated to 80% on room air while ambulating.  She was discharged home on cefdinir and oxygen.  She is aware she needs to follow-up with Dr. Silas Flood. Patient reports she had sleep study not too long ago and follows up with PCCM as an outpatient. Urine strep pneumo negative     #2 history of recurrent pulmonary embolism on Xarelto   #3 hypertension on amlodipine 5 mg daily   #4 history of lupus outpatient f/u   #5 GERD on PPI  #6 chronic diastolic heart failure -she takes Lasix on an as-needed basis at home.  Estimated body mass index is 59.91 kg/m as calculated from the  following:   Height as of this encounter: 5\' 5"  (1.651 m).   Weight as of this encounter: 163.3 kg.  Discharge Instructions  Discharge  Instructions     Call MD for:  difficulty breathing, headache or visual disturbances   Complete by: As directed    Call MD for:  persistant nausea and vomiting   Complete by: As directed    Diet - low sodium heart healthy   Complete by: As directed    Diet - low sodium heart healthy   Complete by: As directed    For home use only DME oxygen   Complete by: As directed    Length of Need: 12 Months   Mode or (Route): Nasal cannula   Liters per Minute: 2   Frequency: Continuous (stationary and portable oxygen unit needed)   Oxygen conserving device: Yes   Oxygen delivery system: Gas   Increase activity slowly   Complete by: As directed    Increase activity slowly   Complete by: As directed       Allergies as of 06/28/2022       Reactions   Penicillins Rash   Has patient had a PCN reaction causing immediate rash, facial/tongue/throat swelling, SOB or lightheadedness with hypotension: no Has patient had a PCN reaction causing severe rash involving mucus membranes or skin necrosis: no Has patient had a PCN reaction that required hospitalization in the hospital at the time Has patient had a PCN reaction occurring within the last 10 years: no If all of the above answers are "NO", then may proceed with Cephalosporin use.        Medication List     STOP taking these medications    ciprofloxacin 500 MG tablet Commonly known as: CIPRO       TAKE these medications    acetaminophen 500 MG tablet Commonly known as: TYLENOL Take 2 tablets (1,000 mg total) by mouth every 6 (six) hours as needed for moderate pain. What changed: Another medication with the same name was added. Make sure you understand how and when to take each.   acetaminophen 325 MG tablet Commonly known as: TYLENOL Take 2 tablets (650 mg total) by mouth every 6 (six) hours as needed for mild pain (or Fever >/= 101). What changed: You were already taking a medication with the same name, and this prescription  was added. Make sure you understand how and when to take each.   albuterol (2.5 MG/3ML) 0.083% nebulizer solution Commonly known as: PROVENTIL Take 3 mLs (2.5 mg total) by nebulization every 6 (six) hours as needed for wheezing or shortness of breath.   albuterol 108 (90 Base) MCG/ACT inhaler Commonly known as: VENTOLIN HFA INHALE 2 PUFFS INTO THE LUNGS EVERY 6 HOURS AS NEEDED FOR WHEEZING OR SHORTNESS OF BREATH   amLODipine 5 MG tablet Commonly known as: NORVASC Take 1 tablet (5 mg total) by mouth daily.   baclofen 10 MG tablet Commonly known as: LIORESAL TAKE 1 TABLET(10 MG) BY MOUTH TWICE DAILY AS NEEDED FOR MUSCLE SPASMS What changed: See the new instructions.   benzonatate 100 MG capsule Commonly known as: TESSALON TAKE 1 CAPSULE(100 MG) BY MOUTH THREE TIMES DAILY AS NEEDED FOR COUGH What changed: See the new instructions.   diclofenac Sodium 1 % Gel Commonly known as: VOLTAREN Apply to knee once daily as needed What changed:  how much to take how to take this when to take this additional instructions   ezetimibe 10 MG tablet Commonly known  as: ZETIA Take 1 tablet (10 mg total) by mouth daily.   fesoterodine 8 MG Tb24 tablet Commonly known as: Toviaz TAKE 1 TABLET(8 MG) BY MOUTH DAILY What changed:  how much to take how to take this when to take this additional instructions   fluticasone 50 MCG/ACT nasal spray Commonly known as: FLONASE SHAKE LIQUID AND USE 2 SPRAYS IN EACH NOSTRIL DAILY What changed: See the new instructions.   furosemide 20 MG tablet Commonly known as: LASIX Take 20 mg by mouth daily as needed for edema.   guaiFENesin 600 MG 12 hr tablet Commonly known as: MUCINEX Take 600 mg by mouth 2 (two) times daily as needed for cough or to loosen phlegm.   B-D UF III MINI PEN NEEDLES 31G X 5 MM Misc Generic drug: Insulin Pen Needle   Insulin Pen Needle 31G X 5 MM Misc Use as directed with Victoza   naltrexone 50 MG tablet Commonly known  as: DEPADE Take 25 mg by mouth daily.   omeprazole 40 MG capsule Commonly known as: PRILOSEC Take 1 capsule (40 mg total) by mouth in the morning and at bedtime. What changed: when to take this   ondansetron 4 MG tablet Commonly known as: ZOFRAN Take 1 tablet (4 mg total) by mouth every 6 (six) hours as needed for nausea.   Stiolto Respimat 2.5-2.5 MCG/ACT Aers Generic drug: Tiotropium Bromide-Olodaterol Inhale 2 puffs into the lungs daily.   triamcinolone cream 0.1 % Commonly known as: KENALOG Apply 1 Application topically 2 (two) times daily as needed. APPLY TO THE AFFECTED AREA TWICE DAILY AS NEEDED FOR SKIN IRRITATION What changed:  reasons to take this additional instructions   valACYclovir 500 MG tablet Commonly known as: VALTREX TAKE 1 TABLET(500 MG) BY MOUTH TWICE DAILY FOR 3 DAYS. START OF OUTBREAK What changed: See the new instructions.   Vitamin D 50 MCG (2000 UT) Caps Take 2,000 Units by mouth daily.   Xarelto 20 MG Tabs tablet Generic drug: rivaroxaban TAKE 1 TABLET(20 MG) BY MOUTH EVERY MORNING What changed: See the new instructions.               Durable Medical Equipment  (From admission, onward)           Start     Ordered   06/28/22 0000  For home use only DME oxygen       Question Answer Comment  Length of Need 12 Months   Mode or (Route) Nasal cannula   Liters per Minute 2   Frequency Continuous (stationary and portable oxygen unit needed)   Oxygen conserving device Yes   Oxygen delivery system Gas      06/28/22 1044            Follow-up Information     Leeanne Rio, MD Follow up.   Specialty: Family Medicine Contact information: [place] [address] [phone]         Hunsucker, Bonna Gains, MD Follow up.   Specialty: Pulmonary Disease Contact information: [address] [place] [address] [facility] Follow up.   Contact  information: [address] [place] [address] [phone]                Allergies  Allergen Reactions   Penicillins Rash    Has patient had a PCN reaction causing immediate rash, facial/tongue/throat swelling, SOB or lightheadedness with hypotension: no Has  patient had a PCN reaction causing severe rash involving mucus membranes or skin necrosis: no Has patient had a PCN reaction that required hospitalization in the hospital at the time Has patient had a PCN reaction occurring within the last 10 years: no If all of the above answers are "NO", then may proceed with Cephalosporin use.     Consultations: pccm   Procedures/Studies: DG Chest 1 View  Result Date: 06/27/2022 CLINICAL DATA:  Shortness of breath EXAM: CHEST  1 VIEW COMPARISON:  06/25/2022 FINDINGS: Power injectable right Port-A-Cath tip: Lower SVC. Heart size within normal limits for projection. There is some mild hazy perihilar density, right greater than left, favoring asymmetric edema, versus pneumonia or aspiration pneumonitis. Hazy linear densities in the left lower lobe. No blunting of the costophrenic angles. Lower thoracic spondylosis. IMPRESSION: 1. Right perihilar and bilateral lower lobe opacity, potentially from asymmetric edema or multilobar pneumonia. 2. Lower thoracic spondylosis. Electronically Signed   By: Van Clines M.D.   On: 06/27/2022 16:07   ECHOCARDIOGRAM COMPLETE  Result Date: 06/25/2022    ECHOCARDIOGRAM REPORT   Patient Name:   SHAYLEA DEY Date of Exam: 06/25/2022 Medical Rec #:  [id number]          Height:       65.0 in Accession #:    [id number]         Weight:       360.0 lb Date of Birth:  10-Feb-1966          BSA:          2.540 m Patient Age:    9 years           BP:           130/55 mmHg Patient Gender: F                  HR:           84 bpm. Exam Location:  Inpatient Procedure: 2D Echo, Cardiac Doppler and Color Doppler Indications:    H/o PE, aspiration pnemonia   History:        Patient has prior history of Echocardiogram examinations.  Sonographer:    Marella Chimes Referring Phys: [phone] [place]  1. Left ventricular ejection fraction, by estimation, is 60 to 65%. The left ventricle has normal function. The left ventricle has no regional wall motion abnormalities. Left ventricular diastolic parameters are indeterminate.  2. Right ventricular systolic function is normal. The right ventricular size is normal. Tricuspid regurgitation signal is inadequate for assessing PA pressure.  3. The mitral valve is normal in structure. No evidence of mitral valve regurgitation. No evidence of mitral stenosis.  4. The aortic valve was not well visualized. Aortic valve regurgitation is not visualized. No aortic stenosis is present.  5. IVC not visualized. FINDINGS  Left Ventricle: Left ventricular ejection fraction, by estimation, is 60 to 65%. The left ventricle has normal function. The left ventricle has no regional wall motion abnormalities. The left ventricular internal cavity size was normal in size. There is  no left ventricular hypertrophy. Left ventricular diastolic parameters are indeterminate. Right Ventricle: The right ventricular size is normal. No increase in right ventricular wall thickness. Right ventricular systolic function is normal. Tricuspid regurgitation signal is inadequate for assessing PA pressure. Left Atrium: Left atrial size was normal in size. Right Atrium: Right atrial size was normal in size. Pericardium: Trivial pericardial effusion is present. Mitral Valve: The mitral valve is normal in  structure. No evidence of mitral valve regurgitation. No evidence of mitral valve stenosis. Tricuspid Valve: The tricuspid valve is normal in structure. Tricuspid valve regurgitation is not demonstrated. Aortic Valve: The aortic valve was not well visualized. Aortic valve regurgitation is not visualized. No aortic stenosis is present. Pulmonic Valve: The  pulmonic valve was normal in structure. Pulmonic valve regurgitation is not visualized. Aorta: The aortic root is normal in size and structure. Venous: The inferior vena cava was not well visualized. IAS/Shunts: No atrial level shunt detected by color flow Doppler. [employer] Electronically signed by Franki Monte Signature Date/Time: 06/25/2022/4:16:03 PM    Final    CT Angio Chest Pulmonary Embolism (PE) W or WO Contrast  Result Date: 06/25/2022 CLINICAL DATA:  57 year old female with history of shortness of breath, chills and hypoxia. EXAM: CT ANGIOGRAPHY CHEST WITH CONTRAST TECHNIQUE: Multidetector CT imaging of the chest was performed using the standard protocol during bolus administration of intravenous contrast. Multiplanar CT image reconstructions and MIPs were obtained to evaluate the vascular anatomy. RADIATION DOSE REDUCTION: This exam was performed according to the departmental dose-optimization program which includes automated exposure control, adjustment of the mA and/or kV according to patient size and/or use of iterative reconstruction technique. CONTRAST:  17mL OMNIPAQUE IOHEXOL 350 MG/ML SOLN COMPARISON:  Cardiac CT 01/25/2021.  Chest CTA 07/19/2020. FINDINGS: Cardiovascular: There are no filling defects within the pulmonary arterial tree to suggest pulmonary embolism. Heart size is normal. There is no significant pericardial fluid, thickening or pericardial calcification. No atherosclerotic calcifications are noted in the thoracic aorta or the coronary arteries. Right internal jugular single-lumen Port-A-Cath with tip terminating in the distal superior vena cava. Mediastinum/Nodes: Multiple prominent borderline enlarged mediastinal lymph nodes are noted, nonspecific, but similar to prior studies, presumably benign. Moderate hiatal hernia. No axillary lymphadenopathy. Lungs/Pleura: Widespread but patchy areas of airspace consolidation scattered in a peribronchovascular distribution  throughout the lungs bilaterally, most severe in the lower lobes of the lungs. No pleural effusions. Upper Abdomen: Unremarkable. Musculoskeletal: There are no aggressive appearing lytic or blastic lesions noted in the visualized portions of the skeleton. Review of the MIP images confirms the above findings. IMPRESSION: 1. No evidence of pulmonary embolism. 2. Extensive multilobar bilateral bronchopneumonia, most severe in the lower lobes of the lungs bilaterally, as above. Electronically Signed   By: Vinnie Langton M.D.   On: 06/25/2022 06:29   DG Chest Portable 1 View  Result Date: 06/25/2022 CLINICAL DATA:  Dyspnea, hemoptysis and shortness breath. EXAM: PORTABLE CHEST 1 VIEW COMPARISON:  PA and lateral 01/15/2022 FINDINGS: There is overlying monitor wiring. Stable right chest MediPort with IJ approach catheter in the distal SVC. The heart has mildly enlarged. There is increased central vascular prominence and basilar interstitial edema with small pleural effusions beginning to form. There are increased infrahilar opacities which could be due to alveolar edema and/or pneumonia. Atelectasis could appear similar. No other focal lung opacity is seen. The mediastinum is normally outlined. There is mild thoracic levoscoliosis with degenerative changes. No acute osseous findings. IMPRESSION: 1. Increased central vascular prominence and basilar interstitial edema consistent with CHF or fluid overload. 2. Increased infrahilar opacities which could be alveolar edema and/or pneumonia. Atelectasis could appear similar. 3. Clinical correlation and radiographic follow-up recommended. Electronically Signed   By: Telford Nab M.D.   On: 06/25/2022 05:37   DG Foot Complete Right  Result Date: 05/30/2022 CLINICAL DATA:  Stepped on a Roofing Nail approximately 1 Hour ago. Managed to remove nail at Home with  family member assistance. Does take Anticoagulants. EXAM: RIGHT FOOT COMPLETE - 3+ VIEW COMPARISON:  None  available FINDINGS: No fracture or dislocation. Soft tissues are unremarkable. IMPRESSION: No acute abnormality of the right foot. No retained radiopaque foreign body. Electronically Signed   By: Miachel Roux M.D.   On: 05/30/2022 17:36   (Echo, Carotid, EGD, Colonoscopy, ERCP)    Subjective:  Anxious to go home Feels breathing is back to her baseline Discharge Exam: Vitals:   06/28/22 0742 06/28/22 0927  BP:  (!) 124/50  Pulse:    Resp:    Temp:    SpO2: 94%    Vitals:   06/28/22 0210 06/28/22 0409 06/28/22 0742 06/28/22 0927  BP:  132/80  (!) 124/50  Pulse:  84    Resp:  16    Temp: 99.1 F (37.3 C) 99 F (37.2 C)    TempSrc:  Oral    SpO2:  92% 94%   Weight:      Height:        General: Pt is alert, awake, not in acute distress Cardiovascular: RRR, S1/S2 +, no rubs, no gallops Respiratory:few rhonchi  bilaterally, no wheezing, no rhonchi Abdominal: Soft, NT, ND, bowel sounds + Extremities: no edema, no cyanosis    The results of significant diagnostics from this hospitalization (including imaging, microbiology, ancillary and laboratory) are listed below for reference.     Microbiology: Recent Results (from the past 240 hour(s))  Resp panel by RT-PCR (RSV, Flu A&B, Covid) Anterior Nasal Swab     Status: None   Collection Time: 06/25/22  6:45 AM   Specimen: Anterior Nasal Swab  Result Value Ref Range Status   SARS Coronavirus 2 by RT PCR NEGATIVE NEGATIVE Final    Comment: (NOTE) SARS-CoV-2 target nucleic acids are NOT DETECTED.  The SARS-CoV-2 RNA is generally detectable in upper respiratory specimens during the acute phase of infection. The lowest concentration of SARS-CoV-2 viral copies this assay can detect is 138 copies/mL. A negative result does not preclude SARS-Cov-2 infection and should not be used as the sole basis for treatment or other patient management decisions. A negative result may occur with  improper specimen collection/handling,  submission of specimen other than nasopharyngeal swab, presence of viral mutation(s) within the areas targeted by this assay, and inadequate number of viral copies(<138 copies/mL). A negative result must be combined with clinical observations, patient history, and epidemiological information. The expected result is Negative.  Fact Sheet for Patients:  EntrepreneurPulse.com.au  Fact Sheet for Healthcare Providers:  IncredibleEmployment.be  This test is no t yet approved or cleared by the [place] FDA and  has been authorized for detection and/or diagnosis of SARS-CoV-2 by FDA under an Emergency Use Authorization (EUA). This EUA will remain  in effect (meaning this test can be used) for the duration of the COVID-19 declaration under Section 564(b)(1) of the Act, 21 U.S.C.section 360bbb-3(b)(1), unless the authorization is terminated  or revoked sooner.       Influenza A by PCR NEGATIVE NEGATIVE Final   Influenza B by PCR NEGATIVE NEGATIVE Final    Comment: (NOTE) The Xpert Xpress SARS-CoV-2/FLU/RSV plus assay is intended as an aid in the diagnosis of influenza from Nasopharyngeal swab specimens and should not be used as a sole basis for treatment. Nasal washings and aspirates are unacceptable for Xpert Xpress SARS-CoV-2/FLU/RSV testing.  Fact Sheet for Patients: EntrepreneurPulse.com.au  Fact Sheet for Healthcare Providers: IncredibleEmployment.be  This test is not yet approved or cleared by the  [place] Architectural technologist and has been authorized for detection and/or diagnosis of SARS-CoV-2 by FDA under an Print production planner (EUA). This EUA will remain in effect (meaning this test can be used) for the duration of the COVID-19 declaration under Section 564(b)(1) of the Act, 21 U.S.C. section 360bbb-3(b)(1), unless the authorization is terminated or revoked.     Resp Syncytial Virus by PCR NEGATIVE  NEGATIVE Final    Comment: (NOTE) Fact Sheet for Patients: EntrepreneurPulse.com.au  Fact Sheet for Healthcare Providers: IncredibleEmployment.be  This test is not yet approved or cleared by the [place] FDA and has been authorized for detection and/or diagnosis of SARS-CoV-2 by FDA under an Emergency Use Authorization (EUA). This EUA will remain in effect (meaning this test can be used) for the duration of the COVID-19 declaration under Section 564(b)(1) of the Act, 21 U.S.C. section 360bbb-3(b)(1), unless the authorization is terminated or revoked.  Performed at [facility], [address], [place], [place] [address]   MRSA Next Gen by PCR, Nasal     Status: None   Collection Time: 06/25/22  9:02 AM   Specimen: Nasal Mucosa; Nasal Swab  Result Value Ref Range Status   MRSA by PCR Next Gen NOT DETECTED NOT DETECTED Final    Comment: (NOTE) The GeneXpert MRSA Assay (FDA approved for NASAL specimens only), is one component of a comprehensive MRSA colonization surveillance program. It is not intended to diagnose MRSA infection nor to guide or monitor treatment for MRSA infections. Test performance is not FDA approved in patients less than 68 years old. Performed at [facility], [place] [address], [place], [place] [address]   Respiratory (~20 pathogens) panel by PCR     Status: None   Collection Time: 06/25/22 10:23 AM   Specimen: Nasopharyngeal Swab; Respiratory  Result Value Ref Range Status   Adenovirus NOT DETECTED NOT DETECTED Final   Coronavirus 229E NOT DETECTED NOT DETECTED Final    Comment: (NOTE) The Coronavirus on the Respiratory Panel, DOES NOT test for the novel  Coronavirus (2019 nCoV)    Coronavirus HKU1 NOT DETECTED NOT DETECTED Final   Coronavirus NL63 NOT DETECTED NOT DETECTED Final   Coronavirus OC43 NOT DETECTED NOT DETECTED Final   Metapneumovirus NOT DETECTED NOT DETECTED  Final   Rhinovirus / Enterovirus NOT DETECTED NOT DETECTED Final   Influenza A NOT DETECTED NOT DETECTED Final   Influenza B NOT DETECTED NOT DETECTED Final   Parainfluenza Virus 1 NOT DETECTED NOT DETECTED Final   Parainfluenza Virus 2 NOT DETECTED NOT DETECTED Final   Parainfluenza Virus 3 NOT DETECTED NOT DETECTED Final   Parainfluenza Virus 4 NOT DETECTED NOT DETECTED Final   Respiratory Syncytial Virus NOT DETECTED NOT DETECTED Final   Bordetella pertussis NOT DETECTED NOT DETECTED Final   Bordetella Parapertussis NOT DETECTED NOT DETECTED Final   Chlamydophila pneumoniae NOT DETECTED NOT DETECTED Final   Mycoplasma pneumoniae NOT DETECTED NOT DETECTED Final    Comment: Performed at [facility], [place]. [address], [place], [place] [address]     Labs: BNP (last 3 results) Recent Labs    12/17/21 1447 06/25/22 0452  BNP 43.4 123456   Basic Metabolic Panel: Recent Labs  Lab 06/25/22 0452 06/26/22 0354 06/28/22 0252  NA 139 138 138  K 3.8 3.7 3.6  CL 106 106 105  CO2 22 25 26   GLUCOSE 147* 99 104*  BUN 25* 20 18  CREATININE 1.00 1.06* 1.14*  CALCIUM 9.6 8.7* 8.7*  Liver Function Tests: Recent Labs  Lab 06/26/22 0354 06/28/22 0252  AST 13* 15  ALT 9 10  ALKPHOS 65 69  BILITOT 0.6 0.6  PROT 6.1* 6.3*  ALBUMIN 3.2* 3.1*   No results for input(s): "LIPASE", "AMYLASE" in the last 168 hours. No results for input(s): "AMMONIA" in the last 168 hours. CBC: Recent Labs  Lab 06/25/22 0452 06/26/22 0354 06/28/22 0252  WBC 12.5* 9.1 8.7  HGB 10.4* 8.4* 8.3*  HCT 34.2* 27.7* 27.1*  MCV 84.4 86.0 86.0  PLT 260 191 216   Cardiac Enzymes: No results for input(s): "CKTOTAL", "CKMB", "CKMBINDEX", "TROPONINI" in the last 168 hours. BNP: Invalid input(s): "POCBNP" CBG: Recent Labs  Lab 06/27/22 1115 06/27/22 1658 06/27/22 2040 06/28/22 0750 06/28/22 1104  GLUCAP 101* 98 112* 96 104*   D-Dimer No results for input(s): "DDIMER" in the last 72 hours. Hgb  A1c Recent Labs    06/26/22 0354  HGBA1C 5.6   Lipid Profile No results for input(s): "CHOL", "HDL", "LDLCALC", "TRIG", "CHOLHDL", "LDLDIRECT" in the last 72 hours. Thyroid function studies No results for input(s): "TSH", "T4TOTAL", "T3FREE", "THYROIDAB" in the last 72 hours.  Invalid input(s): "FREET3" Anemia work up No results for input(s): "VITAMINB12", "FOLATE", "FERRITIN", "TIBC", "IRON", "RETICCTPCT" in the last 72 hours. Urinalysis    Component Value Date/Time   COLORURINE STRAW (A) 07/19/2020 1158   APPEARANCEUR CLEAR 07/19/2020 1158   LABSPEC 1.008 07/19/2020 1158   LABSPEC 1.015 04/23/2015 1247   PHURINE 6.0 07/19/2020 1158   GLUCOSEU NEGATIVE 07/19/2020 1158   GLUCOSEU Negative 04/23/2015 1247   HGBUR NEGATIVE 07/19/2020 1158   BILIRUBINUR NEGATIVE 07/19/2020 1158   BILIRUBINUR Color Interference 04/23/2015 1247   KETONESUR NEGATIVE 07/19/2020 1158   PROTEINUR NEGATIVE 07/19/2020 1158   UROBILINOGEN Color Interference 04/23/2015 1247   NITRITE NEGATIVE 07/19/2020 1158   LEUKOCYTESUR NEGATIVE 07/19/2020 1158   LEUKOCYTESUR Color Interference 04/23/2015 1247   Sepsis Labs Recent Labs  Lab 06/25/22 0452 06/26/22 0354 06/28/22 0252  WBC 12.5* 9.1 8.7   Microbiology Recent Results (from the past 240 hour(s))  Resp panel by RT-PCR (RSV, Flu A&B, Covid) Anterior Nasal Swab     Status: None   Collection Time: 06/25/22  6:45 AM   Specimen: Anterior Nasal Swab  Result Value Ref Range Status   SARS Coronavirus 2 by RT PCR NEGATIVE NEGATIVE Final    Comment: (NOTE) SARS-CoV-2 target nucleic acids are NOT DETECTED.  The SARS-CoV-2 RNA is generally detectable in upper respiratory specimens during the acute phase of infection. The lowest concentration of SARS-CoV-2 viral copies this assay can detect is 138 copies/mL. A negative result does not preclude SARS-Cov-2 infection and should not be used as the sole basis for treatment or other patient management  decisions. A negative result may occur with  improper specimen collection/handling, submission of specimen other than nasopharyngeal swab, presence of viral mutation(s) within the areas targeted by this assay, and inadequate number of viral copies(<138 copies/mL). A negative result must be combined with clinical observations, patient history, and epidemiological information. The expected result is Negative.  Fact Sheet for Patients:  EntrepreneurPulse.com.au  Fact Sheet for Healthcare Providers:  IncredibleEmployment.be  This test is no t yet approved or cleared by the [place] FDA and  has been authorized for detection and/or diagnosis of SARS-CoV-2 by FDA under an Emergency Use Authorization (EUA). This EUA will remain  in effect (meaning this test can be used) for the duration of the COVID-19 declaration under Section 564(b)(1) of  the Act, 21 U.S.C.section 360bbb-3(b)(1), unless the authorization is terminated  or revoked sooner.       Influenza A by PCR NEGATIVE NEGATIVE Final   Influenza B by PCR NEGATIVE NEGATIVE Final    Comment: (NOTE) The Xpert Xpress SARS-CoV-2/FLU/RSV plus assay is intended as an aid in the diagnosis of influenza from Nasopharyngeal swab specimens and should not be used as a sole basis for treatment. Nasal washings and aspirates are unacceptable for Xpert Xpress SARS-CoV-2/FLU/RSV testing.  Fact Sheet for Patients: EntrepreneurPulse.com.au  Fact Sheet for Healthcare Providers: IncredibleEmployment.be  This test is not yet approved or cleared by the [place] FDA and has been authorized for detection and/or diagnosis of SARS-CoV-2 by FDA under an Emergency Use Authorization (EUA). This EUA will remain in effect (meaning this test can be used) for the duration of the COVID-19 declaration under Section 564(b)(1) of the Act, 21 U.S.C. section 360bbb-3(b)(1), unless the  authorization is terminated or revoked.     Resp Syncytial Virus by PCR NEGATIVE NEGATIVE Final    Comment: (NOTE) Fact Sheet for Patients: EntrepreneurPulse.com.au  Fact Sheet for Healthcare Providers: IncredibleEmployment.be  This test is not yet approved or cleared by the [place] FDA and has been authorized for detection and/or diagnosis of SARS-CoV-2 by FDA under an Emergency Use Authorization (EUA). This EUA will remain in effect (meaning this test can be used) for the duration of the COVID-19 declaration under Section 564(b)(1) of the Act, 21 U.S.C. section 360bbb-3(b)(1), unless the authorization is terminated or revoked.  Performed at [facility], [address] [place], [place] [address]   MRSA Next Gen by PCR, Nasal     Status: None   Collection Time: 06/25/22  9:02 AM   Specimen: Nasal Mucosa; Nasal Swab  Result Value Ref Range Status   MRSA by PCR Next Gen NOT DETECTED NOT DETECTED Final    Comment: (NOTE) The GeneXpert MRSA Assay (FDA approved for NASAL specimens only), is one component of a comprehensive MRSA colonization surveillance program. It is not intended to diagnose MRSA infection nor to guide or monitor treatment for MRSA infections. Test performance is not FDA approved in patients less than 28 years old. Performed at [facility], [place] [address] [place], [place] [address]   Respiratory (~20 pathogens) panel by PCR     Status: None   Collection Time: 06/25/22 10:23 AM   Specimen: Nasopharyngeal Swab; Respiratory  Result Value Ref Range Status   Adenovirus NOT DETECTED NOT DETECTED Final   Coronavirus 229E NOT DETECTED NOT DETECTED Final    Comment: (NOTE) The Coronavirus on the Respiratory Panel, DOES NOT test for the novel  Coronavirus (2019 nCoV)    Coronavirus HKU1 NOT DETECTED NOT DETECTED Final   Coronavirus NL63 NOT DETECTED NOT DETECTED Final   Coronavirus  OC43 NOT DETECTED NOT DETECTED Final   Metapneumovirus NOT DETECTED NOT DETECTED Final   Rhinovirus / Enterovirus NOT DETECTED NOT DETECTED Final   Influenza A NOT DETECTED NOT DETECTED Final   Influenza B NOT DETECTED NOT DETECTED Final   Parainfluenza Virus 1 NOT DETECTED NOT DETECTED Final   Parainfluenza Virus 2 NOT DETECTED NOT DETECTED Final   Parainfluenza Virus 3 NOT DETECTED NOT DETECTED Final   Parainfluenza Virus 4 NOT DETECTED NOT DETECTED Final   Respiratory Syncytial Virus NOT DETECTED NOT DETECTED Final   Bordetella pertussis NOT DETECTED NOT DETECTED Final   Bordetella Parapertussis NOT DETECTED NOT DETECTED Final   Chlamydophila pneumoniae NOT DETECTED  NOT DETECTED Final   Mycoplasma pneumoniae NOT DETECTED NOT DETECTED Final    Comment: Performed at [facility], [place] [address], [place], [place] [address]     Time coordinating discharge: 39 minutes  SIGNED: Georgette Shell, MD  [facility] 06/28/2022, 12:27 PM

## 2022-06-28 NOTE — TOC Transition Note (Addendum)
Transition of Care [facility]) - CM/SW Discharge Note   Patient Details  Name: [name] MRN: [id number] Date of Birth: 1965/06/25  [facility] CM/SW Contact:  Henrietta Dine, RN Phone Number: 06/28/2022, 11:56 AM   Clinical Narrative:    [facility] consult for home oxygen; spoke w/ pt in room; she agrees to home oxygen; explained to pt travel tank will be delivered to room, and she verbalized understanding; contacted Erasmo Downer at [place] and she says agency can provide services to pt; pt given agency name, and agency contact info placed in follow up provider section of d/c instructions; no TOC needs.   Final next level of care: Home/Self Care Barriers to Discharge: No Barriers Identified   Patient Goals and CMS Choice      Discharge Placement                         Discharge Plan and Services Additional resources added to the After Visit Summary for                  DME Arranged: Oxygen DME Agency: [employer] Date DME Agency Contacted: 06/28/22 Time DME Agency Contacted: [id number] Representative spoke with at DME Agency: [place] ([place]) Interventions SDOH Screenings   Food Insecurity: No Food Insecurity (06/25/2022)  Housing: Low Risk  (06/25/2022)  Transportation Needs: No Transportation Needs (06/25/2022)  Utilities: Not At Risk (06/25/2022)  Depression (PHQ2-9): Low Risk  (06/11/2022)  Financial Resource Strain: High Risk (06/23/2018)  Stress: No Stress Concern Present (06/23/2018)  Tobacco Use: Low Risk  (06/25/2022)     Readmission Risk Interventions    06/27/2022    3:59 PM  Readmission Risk Prevention Plan  Transportation Screening Complete  PCP or Specialist Appt within 5-7 Days Complete  Home Care Screening Complete  Medication Review (RN CM) Complete

## 2022-06-30 ENCOUNTER — Telehealth: Payer: Self-pay

## 2022-06-30 ENCOUNTER — Other Ambulatory Visit: Payer: Self-pay | Admitting: Family Medicine

## 2022-06-30 NOTE — Transitions of Care (Post Inpatient/ED Visit) (Signed)
   06/30/2022  Name: [name] MRN: [id number] DOB: 1965-09-19  Today's TOC FU Call Status: Today's TOC FU Call Status:: Successful TOC FU Call Competed TOC FU Call Complete Date: 06/30/22  Transition Care Management Follow-up Telephone Call Date of Discharge: 06/28/22 Discharge Facility: [facility]) Type of Discharge: Inpatient Admission Primary Inpatient Discharge Diagnosis:: respiratory failure How have you been since you were released from the hospital?: Better Any questions or concerns?: No  Items Reviewed: Did you receive and understand the discharge instructions provided?: Yes Medications obtained and verified?: Yes (Medications Reviewed) Any new allergies since your discharge?: No Dietary orders reviewed?: Yes Do you have support at home?: Yes People in Home: spouse, child(ren), adult  Home Care and Equipment/Supplies: [place] Ordered?: NA Any new equipment or medical supplies ordered?: Yes Name of Medical supply agency?: [facility] Were you able to get the equipment/medical supplies?: Yes Do you have any questions related to the use of the equipment/supplies?: No  Functional Questionnaire: Do you need assistance with bathing/showering or dressing?: No Do you need assistance with meal preparation?: No Do you need assistance with eating?: No Do you have difficulty maintaining continence: No Do you need assistance with getting out of bed/getting out of a chair/moving?: No Do you have difficulty managing or taking your medications?: No  Follow up appointments reviewed: PCP Follow-up appointment confirmed?: Yes Date of PCP follow-up appointment?: 07/02/22 Follow-up Provider: [facility] Follow-up appointment confirmed?: No Reason Specialist Follow-Up Not Confirmed: Patient has Specialist Provider Number and will Call for Appointment Do you need transportation to your follow-up appointment?: No Do you understand care  options if your condition(s) worsen?: Yes-patient verbalized understanding    [place], [place] Nurse Health Advisor Direct Dial [phone]

## 2022-07-01 NOTE — Progress Notes (Unsigned)
    SUBJECTIVE:   CHIEF COMPLAINT / HPI:   [name] is a 57 y.o. female who presents to the [facility] clinic today to discuss the following concerns:   Hospital F/U Recently admitted from 3/20 to 3/23 for acute respiratory failure with hypoxia due to multilobar bilateral bronchopneumonia.  This was thought to be due to aspiration secondary to hiatal hernia, obesity, reflux.  She was treated with Rocephin and azithromycin.  She desaturated to 80% on room air with ambulation and was ultimately discharged home on cefdinir and oxygen.  She has follow-up with Dr. Silas Flood with pulmonology.  Recommendations for Outpatient Follow-up:  Follow up with PCP in 1-2 weeks Please obtain BMP/CBC in one week Please follow up with Dr. Silas Flood  Reports that this is the 3rd episode of her having aspiration PNA. States that End of 2022 (occurred after colonoscopy) and 2023 were other episodes. She states that it is thought to be due to due to her hiatal hernia. Has plans to have hernia corrected as well as bariatric surgery but still working to lose weight (advised to lose 50 lbs, she has 38 more lbs to go) to be approved for bariatric surgery.   Since hospital d/c she reports she has not had to use her oxygen. She still carries portable oxygen around and her pulse ox but her sats have been 90% or above. She does not feel SOB. She does continue to have a cough- sometimes productive with clear sputum, sometimes dry. Taking Mucinex. Requests refill for [employer]    PERTINENT  PMH / PSH: Hypertension, HFpEF, OSA, GERD, class 3 obesity, scleroderma, lifelong anticoagulation from VTE, hx endometrial cancer and ovarian cancer both in remission (chemo)   OBJECTIVE:   BP (!) 149/74   Pulse 81   Wt (!) 355 lb 3.2 oz (161.1 kg)   LMP 10/16/2013 (Approximate)   SpO2 99%   BMI 59.11 kg/m   BP Readings from Last 3 Encounters:  07/02/22 136/74  06/28/22 129/63  06/11/22 137/61   General: NAD,  pleasant, able to participate in exam Cardiac: RRR, no murmurs. Respiratory: CTAB, normal effort on room air, No wheezes, rales or rhonchi Abdomen: obese abdomen, soft  Extremities: no edema Skin: warm and dry Psych: Normal affect and mood  ASSESSMENT/PLAN:   1. Elevated serum creatinine Will recheck creatinine today.  Creatinine at discharge 1.14, electrolytes stable.  - Basic Metabolic Panel  2. Normocytic anemia Hemoglobin was low at 8.3. Baseline is about 12. Denies any blood loss, will recheck.  - CBC  3. Essential hypertension, benign Elevated today.  Has had history of persistently elevated blood pressures.  Previously on amlodipine 5 mg, discussed adding medication for renal protection to better control her blood pressures, she was amenable to this. - amLODipine-olmesartan (AZOR) 5-20 MG tablet; Take 1 tablet by mouth daily.  Dispense: 30 tablet; Refill: 1 -Recommend follow-up in 1 to 2 weeks for BMP and BP recheck  4. Hospital discharge follow-up Doing well since hospital discharge, has not had to use her DME oxygen.  She carries around oxygen saturation with her and her saturations have been stable. Normal respiratory effort today - benzonatate (TESSALON) 100 MG capsule; Take 1 capsule (100 mg total) by mouth 3 (three) times daily as needed for cough.  Dispense: 30 capsule; Refill: [place]

## 2022-07-01 NOTE — Patient Instructions (Addendum)
It was wonderful to see you today.  Please bring ALL of your medications with you to every visit.   Today we talked about:  I am starting you on a new medicine for your blood pressure. STOP your amlodipine as this new medication will have that in addition to olmesartan.  Return in 1 to 2 weeks for blood pressure check and some blood work to check on your kidneys and electrolytes.  Please keep your appoint with pulmonology next week. We are doing lab work today to check your kidney function, electrolytes, and blood count. I will send you a MyChart message if you have MyChart. Otherwise, I will give you a call for abnormal results or send a letter if everything returned back normal. If you don't hear from me in 2 weeks, please call the office.    I have refilled your Tessalon Perles.  Use your oxygen if your oxygen saturation falls below 92% consistently.   Seek immediate medical attention if you have chest pain, worsening SOB.   Thank you for coming to your visit as scheduled. We have had a large "no-show" problem lately, and this significantly limits our ability to see and care for patients. As a friendly reminder- if you cannot make your appointment please call to cancel. We do have a no show policy for those who do not cancel within 24 hours. Our policy is that if you miss or fail to cancel an appointment within 24 hours, 3 times in a 50-month period, you may be dismissed from our clinic.   Thank you for choosing [facility].   Please call [phone] with any questions about today's appointment.  Please be sure to schedule follow up at the front  desk before you leave today.   Sabino Dick, DO PGY-3 Family Medicine

## 2022-07-02 ENCOUNTER — Other Ambulatory Visit: Payer: Self-pay | Admitting: Family Medicine

## 2022-07-02 ENCOUNTER — Encounter: Payer: Self-pay | Admitting: Family Medicine

## 2022-07-02 ENCOUNTER — Ambulatory Visit (INDEPENDENT_AMBULATORY_CARE_PROVIDER_SITE_OTHER): Payer: Medicare HMO | Admitting: Family Medicine

## 2022-07-02 VITALS — BP 136/74 | HR 81 | Wt 355.2 lb

## 2022-07-02 DIAGNOSIS — D649 Anemia, unspecified: Secondary | ICD-10-CM

## 2022-07-02 DIAGNOSIS — R7989 Other specified abnormal findings of blood chemistry: Secondary | ICD-10-CM

## 2022-07-02 DIAGNOSIS — Z09 Encounter for follow-up examination after completed treatment for conditions other than malignant neoplasm: Secondary | ICD-10-CM | POA: Diagnosis not present

## 2022-07-02 DIAGNOSIS — I10 Essential (primary) hypertension: Secondary | ICD-10-CM

## 2022-07-02 MED ORDER — BENZONATATE 100 MG PO CAPS: 100.0000 mg | ORAL_CAPSULE | Freq: Three times a day (TID) | ORAL | 0 refills | Status: DC | PRN

## 2022-07-02 MED ORDER — AMLODIPINE-OLMESARTAN 5-20 MG PO TABS: 1.0000 | ORAL_TABLET | Freq: Every day | ORAL | 1 refills | Status: DC

## 2022-07-03 LAB — CBC
Hematocrit: 33.8 % — ABNORMAL LOW (ref 34.0–46.6)
Hemoglobin: 10.3 g/dL — ABNORMAL LOW (ref 11.1–15.9)
MCH: 25.7 pg — ABNORMAL LOW (ref 26.6–33.0)
MCHC: 30.5 g/dL — ABNORMAL LOW (ref 31.5–35.7)
MCV: 84 fL (ref 79–97)
Platelets: 319 10*3/uL (ref 150–450)
RBC: 4.01 x10E6/uL (ref 3.77–5.28)
RDW: 15 % (ref 11.7–15.4)
WBC: 6.7 10*3/uL (ref 3.4–10.8)

## 2022-07-03 LAB — BASIC METABOLIC PANEL
BUN/Creatinine Ratio: 14 (ref 9–23)
BUN: 16 mg/dL (ref 6–24)
CO2: 25 mmol/L (ref 20–29)
Calcium: 9.9 mg/dL (ref 8.7–10.2)
Chloride: 102 mmol/L (ref 96–106)
Creatinine, Ser: 1.11 mg/dL — ABNORMAL HIGH (ref 0.57–1.00)
Glucose: 131 mg/dL — ABNORMAL HIGH (ref 70–99)
Potassium: 4.3 mmol/L (ref 3.5–5.2)
Sodium: 142 mmol/L (ref 134–144)
eGFR: 58 mL/min/{1.73_m2} — ABNORMAL LOW (ref >59)

## 2022-07-08 ENCOUNTER — Ambulatory Visit (INDEPENDENT_AMBULATORY_CARE_PROVIDER_SITE_OTHER): Payer: Medicare HMO | Admitting: Nurse Practitioner

## 2022-07-08 ENCOUNTER — Encounter: Payer: Self-pay | Admitting: Nurse Practitioner

## 2022-07-08 VITALS — BP 128/74 | HR 70 | Ht 65.0 in | Wt 356.2 lb

## 2022-07-08 DIAGNOSIS — J189 Pneumonia, unspecified organism: Secondary | ICD-10-CM

## 2022-07-08 DIAGNOSIS — G4734 Idiopathic sleep related nonobstructive alveolar hypoventilation: Secondary | ICD-10-CM

## 2022-07-08 DIAGNOSIS — J9601 Acute respiratory failure with hypoxia: Secondary | ICD-10-CM | POA: Diagnosis not present

## 2022-07-08 DIAGNOSIS — K219 Gastro-esophageal reflux disease without esophagitis: Secondary | ICD-10-CM | POA: Diagnosis not present

## 2022-07-08 NOTE — Assessment & Plan Note (Signed)
She continues to require supplemental oxygen with activity.  She had a walk test today with desaturation 85%.  She will continue 2 L/min with activity and at night.  We will obtain [name] to ensure this corrects the nocturnal hypoxia as she does have some mild underlying OSA as well.  Will try to avoid CPAP if at all possible given her reflux.  Can we walk her at follow-up to see if she is still requiring oxygen at that time.

## 2022-07-08 NOTE — Progress Notes (Signed)
@Patient  ID: [name], female    DOB: 1965-11-06, 57 y.o.   MRN: [id number]  Chief Complaint  Patient presents with   Follow-up    Pt f/u she was admitted from 3/20-3/23, she reports that she is feeling better. She does check her O2 regularly and is no longer using the O2 she was d/c w/.     Referring provider: Latrelle Dodrill, MD  HPI: 57 year old female, never smoker followed for recurrent pneumonia. She is a patient of Dr. Laurena Spies and last seen in office 06/05/2022. Past medical history significant for CHF, HTN, GERD, hiatal hernia, ovarian and endometrial cancer in remission, recurrent PE on Xarelto, CKD, allergies, prediabetes, OSA.  TEST/EVENTS: 06/08/2022 HST: AHI 8.5/h with apnea index of 4.3/h.  SpO2 low 74% 06/25/2022 CTA chest: no evidence of PE. Multiple prominent enlarged mediastinal lymph nodes, presumably benign. Hiatal hernia.  Widespread but patchy areas of airspace consolidation scattered in a peribronchovascular distribution throughout the lungs bilaterally, most severe in the lower lobes of the lungs. 06/25/2022 echo: EF 60 to 65%.  RV size and function normal.  Unable to measure PASP.  No valvular abnormalities. 06/27/2022 CXR: Right perihilar and bilateral lower lobe opacity, potentially from asymmetric edema or multilobar pneumonia  06/05/2022: OV with Dr. Judeth Horn.  Doing okay.  Used a sample of Anoro at last visit.  Did feel like this helped with breathing.  She never had Rx sent in for this.  Primary problem of recurrent pneumonias likely due to hiatal hernia and aspiration from this.  She is actively working on weight loss.  Needs to lose 50 more pounds before bariatric surgery and possible Niesen fundoplication.  Felt as though her cough and recurrent pneumonias would be difficult to control with hiatal hernia on secured.  Continued on PPI and GERD prevention measures.  She does have asthma.  Diagnosed as a child.  Symptoms were not as bad as an older  adult.  Felt as though Anoro was helpful.  Not covered by insurance.  Will start her on Stiolto.  Avoiding ICS given recurrent pneumonias.  Concern for possible underlying sleep apnea.  Home sleep study ordered.  07/08/2022: Today-follow-up Patient presents today for hospital follow-up.  She was admitted 06/25/2022-06/28/2022 for acute respiratory failure secondary to recurrent aspiration pneumonia in the setting of hiatal hernia and gastric reflux.  She was treated with ceftriaxone and azithromycin and transition to oral cefdinir at discharge.  She was started on pulmonary hygiene measures and continued reflux precautions.  She is still looking at surgical correction of her hiatal hernia as well as gastric bypass but has to lose 50 more pounds.  She is advised to continue doing so when she was home.  Recommended leaning against CPAP given very mild sleep apnea and reflux risk.  She had a walk test prior to discharge and desaturated to 80%.  She was discharged home on supplemental O2. Today, she tells me that she is feeling much better.  She still feels a bit more fatigued compared to her baseline and does not feel like her breathing is quite back to normal but slowly improving every day.  Her cough is mostly resolved.  She has not had any fevers, chills, hemoptysis, lower extremity swelling, chest congestion, wheezing.  She is eating and drinking well.  She has been monitoring her oxygen levels at home and has not noticed any levels below 88 to 90%.  She has not been using her oxygen because of this.  She wants to know if she can go back to water aerobics.  She is still actively working on weight loss and hopeful that she will be down another 50 pounds by August so she can have her hiatal hernia repair and gastric bypass.  She takes omeprazole twice a day and sleeps with a wedge pillow.  Still has some occasional reflux symptoms at night.  She does avoid eating after 6 PM.    Allergies  Allergen Reactions    Penicillins Rash    Has patient had a PCN reaction causing immediate rash, facial/tongue/throat swelling, SOB or lightheadedness with hypotension: no Has patient had a PCN reaction causing severe rash involving mucus membranes or skin necrosis: no Has patient had a PCN reaction that required hospitalization in the hospital at the time Has patient had a PCN reaction occurring within the last 10 years: no If all of the above answers are "NO", then may proceed with Cephalosporin use.     Immunization History  Administered Date(s) Administered   Influenza Split 06/10/2011   Influenza,inj,Quad PF,6+ Mos 12/12/2014, 01/17/2016, 12/04/2016, 01/14/2018, 02/01/2019, 01/05/2020, 12/20/2020, 01/09/2022   Moderna Sars-Covid-2 Vaccination 06/24/2019, 07/25/2019   PFIZER(Purple Top)SARS-COV-2 Vaccination 03/15/2020   PNEUMOCOCCAL CONJUGATE-20 12/20/2020   PPD Test 02/02/2019   Td 07/11/2008   Tdap 05/30/2022    Past Medical History:  Diagnosis Date   Allergy    Anemia    Anxiety    Arthritis    Blood transfusion without reported diagnosis    Cough    Cough 06/11/2018   Difficulty sleeping 06/11/2018   Diverticulitis with perforation 2010   DVT (deep vein thrombosis) in pregnancy 11/24/2016   DVT, lower extremity    Endometrial cancer    Enlarged lymph nodes 08/13/2017   Family history of adverse reaction to anesthesia    pts daughter had difficulty awakening following anesthesia, long time to wake up   GERD (gastroesophageal reflux disease)    Headache    Hemoptysis 04/22/2018   History of bronchitis    History of ear infections    Hospital discharge follow-up 03/02/2019   Hx of migraines    Hyperlipidemia    Hypertension    IBS (irritable bowel syndrome)    Kidney infection    Lupus (systemic lupus erythematosus) 02/2017   Morbid obesity    Neck pain 01/13/2019   Ovarian cancer dx'd 01/2015   PONV (postoperative nausea and vomiting)    Port-A-Cath in place    Right side   Portacath  in place 09/12/2015   Pre-diabetes    Pyelonephritis    Right knee pain 11/28/2011   Scleroderma    Screen for colon cancer 03/02/2019   Slow rate of speech 08/22/2015   Chronic from CVA.  She also has loss of left nasolabial fold and slight left facial droop/small asymmetry on the left side secondary to CVA   UTI (urinary tract infection) 05/07/2018    Tobacco History: Social History   Tobacco Use  Smoking Status Never  Smokeless Tobacco Never   Counseling given: Not Answered   Outpatient Medications Prior to Visit  Medication Sig Dispense Refill   acetaminophen (TYLENOL) 500 MG tablet Take 2 tablets (1,000 mg total) by mouth every 6 (six) hours as needed for moderate pain. 30 tablet 0   albuterol (PROVENTIL) (2.5 MG/3ML) 0.083% nebulizer solution Take 3 mLs (2.5 mg total) by nebulization every 6 (six) hours as needed for wheezing or shortness of breath. 150 mL 1   albuterol (VENTOLIN HFA)  108 (90 Base) MCG/ACT inhaler INHALE 2 PUFFS INTO THE LUNGS EVERY 6 HOURS AS NEEDED FOR WHEEZING OR SHORTNESS OF BREATH 18 g 1   amLODipine-olmesartan (AZOR) 5-20 MG tablet TAKE 1 TABLET BY MOUTH DAILY 90 tablet 0   B-D UF III MINI PEN NEEDLES 31G X 5 MM MISC      baclofen (LIORESAL) 10 MG tablet TAKE 1 TABLET(10 MG) BY MOUTH TWICE DAILY AS NEEDED FOR MUSCLE SPASMS (Patient taking differently: Take 10 mg by mouth 2 (two) times daily as needed for muscle spasms.) 30 tablet 0   benzonatate (TESSALON) 100 MG capsule Take 1 capsule (100 mg total) by mouth 3 (three) times daily as needed for cough. 30 capsule 0   cefdinir (OMNICEF) 300 MG capsule Take 1 capsule (300 mg total) by mouth every 12 (twelve) hours. 7 capsule 0   cetirizine (ZYRTEC) 10 MG tablet Take 10 mg by mouth daily.     Cholecalciferol (VITAMIN D) 50 MCG (2000 UT) CAPS Take 2,000 Units by mouth daily.     diclofenac Sodium (VOLTAREN) 1 % GEL Apply to knee once daily as needed (Patient taking differently: Apply 4 g topically daily.) 100 g 1    ezetimibe (ZETIA) 10 MG tablet Take 1 tablet (10 mg total) by mouth daily. 90 tablet 0   fesoterodine (TOVIAZ) 8 MG TB24 tablet TAKE 1 TABLET(8 MG) BY MOUTH DAILY (Patient taking differently: Take 8 mg by mouth daily.) 90 tablet 2   fluticasone (FLONASE) 50 MCG/ACT nasal spray SHAKE LIQUID AND USE 2 SPRAYS IN EACH NOSTRIL DAILY (Patient taking differently: Place 2 sprays into both nostrils daily as needed for allergies or rhinitis.) 16 g 6   furosemide (LASIX) 20 MG tablet Take 20 mg by mouth daily as needed for edema.     guaiFENesin (MUCINEX) 600 MG 12 hr tablet Take 600 mg by mouth 2 (two) times daily as needed for cough or to loosen phlegm.     naltrexone (DEPADE) 50 MG tablet Take 25 mg by mouth daily.     omeprazole (PRILOSEC) 40 MG capsule Take 1 capsule (40 mg total) by mouth in the morning and at bedtime. (Patient taking differently: Take 40 mg by mouth daily.) 60 capsule 0   ondansetron (ZOFRAN) 4 MG tablet Take 1 tablet (4 mg total) by mouth every 6 (six) hours as needed for nausea. 20 tablet 0   Tiotropium Bromide-Olodaterol (STIOLTO RESPIMAT) 2.5-2.5 MCG/ACT AERS Inhale 2 puffs into the lungs daily. 1 each 11   triamcinolone cream (KENALOG) 0.1 % Apply 1 Application topically 2 (two) times daily as needed. APPLY TO THE AFFECTED AREA TWICE DAILY AS NEEDED FOR SKIN IRRITATION (Patient taking differently: Apply 1 Application topically 2 (two) times daily as needed (Skin irritation.).) 45 g 0   valACYclovir (VALTREX) 500 MG tablet TAKE 1 TABLET(500 MG) BY MOUTH TWICE DAILY FOR 3 DAYS. START OF OUTBREAK (Patient taking differently: Take 500 mg by mouth 2 (two) times daily as needed (Flare ups).) 6 tablet 2   XARELTO 20 MG TABS tablet TAKE 1 TABLET(20 MG) BY MOUTH EVERY MORNING (Patient taking differently: Take 20 mg by mouth in the morning.) 90 tablet 1   Insulin Pen Needle 31G X 5 MM MISC Use as directed with Victoza (Patient not taking: Reported on 07/08/2022)     No facility-administered  medications prior to visit.     Review of Systems:   Constitutional: No weight loss or gain, night sweats, fevers, chills. +fatigue, lassitude. HEENT: No headaches,  difficulty swallowing, tooth/dental problems, or sore throat. No sneezing, itching, ear ache, nasal congestion, or post nasal drip CV:  No chest pain, orthopnea, PND, swelling in lower extremities, anasarca, dizziness, palpitations, syncope Resp: +shortness of breath with exertion (improving); resolving cough. No excess mucus or change in color of mucus.  No hemoptysis. No wheezing.  No chest wall deformity GI:  +heartburn, indigestion. No abdominal pain, nausea, vomiting, diarrhea, change in bowel habits, loss of appetite, bloody stools.  GU: No dysuria, change in color of urine, urgency or frequency.  Skin: No rash, lesions, ulcerations MSK:  No joint pain or swelling.   Neuro: No dizziness or lightheadedness.  Psych: No depression or anxiety. Mood stable.     Physical Exam:  BP 128/74   Pulse 70   Ht  (1.651 m)   Wt (!) 356 lb 3.2 oz (161.6 kg)   LMP 10/16/2013 (Approximate)   SpO2 100%   BMI 59.27 kg/m   GEN: Pleasant, interactive, well-kempt; morbidly obese; in no acute distress. HEENT:  Normocephalic and atraumatic. PERRLA. Sclera white. Nasal turbinates pink, moist and patent bilaterally. No rhinorrhea present. Oropharynx pink and moist, without exudate or edema. No lesions, ulcerations, or postnasal drip.  NECK:  Supple w/ fair ROM. No JVD present. Normal carotid impulses w/o bruits. Thyroid symmetrical with no goiter or nodules palpated. No lymphadenopathy.   CV: RRR, no m/r/g, no peripheral edema. Pulses intact, +2 bilaterally. No cyanosis, pallor or clubbing. PULMONARY:  Unlabored, regular breathing. Clear bilaterally A&P w/o wheezes/rales/rhonchi. No accessory muscle use.  GI: BS present and normoactive. Soft, non-tender to palpation. No organomegaly or masses detected. MSK: No erythema, warmth or  tenderness. Cap refil <2 sec all extrem. No deformities or joint swelling noted.  Neuro: A/Ox3. No focal deficits noted.   Skin: Warm, no lesions or rashe Psych: Normal affect and behavior. Judgement and thought content appropriate.     Lab Results:  CBC    Component Value Date/Time   WBC 6.7 07/02/2022 1452   WBC 8.7 06/28/2022 0252   RBC 4.01 07/02/2022 1452   RBC 3.15 (L) 06/28/2022 0252   HGB 10.3 (L) 07/02/2022 1452   HGB 11.6 01/17/2016 0928   HCT 33.8 (L) 07/02/2022 1452   HCT 35.2 01/17/2016 0928   PLT 319 07/02/2022 1452   MCV 84 07/02/2022 1452   MCV 93.1 01/17/2016 0928   MCH 25.7 (L) 07/02/2022 1452   MCH 26.3 06/28/2022 0252   MCHC 30.5 (L) 07/02/2022 1452   MCHC 30.6 06/28/2022 0252   RDW 15.0 07/02/2022 1452   RDW 14.0 01/17/2016 0928   LYMPHSABS 1.8 12/17/2021 1447   LYMPHSABS 1.9 01/17/2016 0928   MONOABS 0.6 11/26/2020 1108   MONOABS 0.5 01/17/2016 0928   EOSABS 0.1 12/17/2021 1447   BASOSABS 0.0 12/17/2021 1447   BASOSABS 0.0 01/17/2016 0928    BMET    Component Value Date/Time   NA 142 07/02/2022 1452   NA 140 01/17/2016 0928   K 4.3 07/02/2022 1452   K 4.8 01/17/2016 0928   CL 102 07/02/2022 1452   CO2 25 07/02/2022 1452   CO2 22 01/17/2016 0928   GLUCOSE 131 (H) 07/02/2022 1452   GLUCOSE 104 (H) 06/28/2022 0252   GLUCOSE 103 01/17/2016 0928   BUN 16 07/02/2022 1452   BUN 28.7 (H) 01/17/2016 0928   CREATININE 1.11 (H) 07/02/2022 1452   CREATININE 1.1 01/17/2016 0928   CALCIUM 9.9 07/02/2022 1452   CALCIUM 10.0 01/17/2016 0928   GFRNONAA  56 (L) 06/28/2022 0252   GFRNONAA 77 02/26/2015 0945   GFRAA 59 (L) 07/20/2019 1004   GFRAA 89 02/26/2015 0945    BNP    Component Value Date/Time   BNP 68.3 06/25/2022 0452     Imaging:  DG Chest 1 View  Result Date: 06/27/2022 CLINICAL DATA:  Shortness of breath EXAM: CHEST  1 VIEW COMPARISON:  06/25/2022 FINDINGS: Power injectable right Port-A-Cath tip: Lower SVC. Heart size within  normal limits for projection. There is some mild hazy perihilar density, right greater than left, favoring asymmetric edema, versus pneumonia or aspiration pneumonitis. Hazy linear densities in the left lower lobe. No blunting of the costophrenic angles. Lower thoracic spondylosis. IMPRESSION: 1. Right perihilar and bilateral lower lobe opacity, potentially from asymmetric edema or multilobar pneumonia. 2. Lower thoracic spondylosis. Electronically Signed   By: Gaylyn Rong M.D.   On: 06/27/2022 16:07   ECHOCARDIOGRAM COMPLETE  Result Date: 06/25/2022    ECHOCARDIOGRAM REPORT   Patient Name:   MALON SIDDALL Date of Exam: 06/25/2022 Medical Rec #:  [id number]          Height:       65.0 in Accession #:    [id number]         Weight:       360.0 lb Date of Birth:  08-06-1965          BSA:          2.540 m Patient Age:    56 years           BP:           130/55 mmHg Patient Gender: F                  HR:           84 bpm. Exam Location:  Inpatient Procedure: 2D Echo, Cardiac Doppler and Color Doppler Indications:    H/o PE, aspiration pnemonia  History:        Patient has prior history of Echocardiogram examinations.  Sonographer:    Lucy Antigua Referring Phys: [phone] DAVID MANUEL ORTIZ IMPRESSIONS  1. Left ventricular ejection fraction, by estimation, is 60 to 65%. The left ventricle has normal function. The left ventricle has no regional wall motion abnormalities. Left ventricular diastolic parameters are indeterminate.  2. Right ventricular systolic function is normal. The right ventricular size is normal. Tricuspid regurgitation signal is inadequate for assessing PA pressure.  3. The mitral valve is normal in structure. No evidence of mitral valve regurgitation. No evidence of mitral stenosis.  4. The aortic valve was not well visualized. Aortic valve regurgitation is not visualized. No aortic stenosis is present.  5. IVC not visualized. FINDINGS  Left Ventricle: Left ventricular ejection fraction, by  estimation, is 60 to 65%. The left ventricle has normal function. The left ventricle has no regional wall motion abnormalities. The left ventricular internal cavity size was normal in size. There is  no left ventricular hypertrophy. Left ventricular diastolic parameters are indeterminate. Right Ventricle: The right ventricular size is normal. No increase in right ventricular wall thickness. Right ventricular systolic function is normal. Tricuspid regurgitation signal is inadequate for assessing PA pressure. Left Atrium: Left atrial size was normal in size. Right Atrium: Right atrial size was normal in size. Pericardium: Trivial pericardial effusion is present. Mitral Valve: The mitral valve is normal in structure. No evidence of mitral valve regurgitation. No evidence of mitral valve stenosis. Tricuspid Valve: The tricuspid valve is normal  in structure. Tricuspid valve regurgitation is not demonstrated. Aortic Valve: The aortic valve was not well visualized. Aortic valve regurgitation is not visualized. No aortic stenosis is present. Pulmonic Valve: The pulmonic valve was normal in structure. Pulmonic valve regurgitation is not visualized. Aorta: The aortic root is normal in size and structure. Venous: The inferior vena cava was not well visualized. IAS/Shunts: No atrial level shunt detected by color flow Doppler. Dalton [employer] Electronically signed by Wilfred Lacy Signature Date/Time: 06/25/2022/4:16:03 PM    Final    CT Angio Chest Pulmonary Embolism (PE) W or WO Contrast  Result Date: 06/25/2022 CLINICAL DATA:  57 year old female with history of shortness of breath, chills and hypoxia. EXAM: CT ANGIOGRAPHY CHEST WITH CONTRAST TECHNIQUE: Multidetector CT imaging of the chest was performed using the standard protocol during bolus administration of intravenous contrast. Multiplanar CT image reconstructions and MIPs were obtained to evaluate the vascular anatomy. RADIATION DOSE REDUCTION: This exam was  performed according to the departmental dose-optimization program which includes automated exposure control, adjustment of the mA and/or kV according to patient size and/or use of iterative reconstruction technique. CONTRAST:  OMNIPAQUE IOHEXOL 350 MG/ML SOLN COMPARISON:  Cardiac CT 01/25/2021.  Chest CTA 07/19/2020. FINDINGS: Cardiovascular: There are no filling defects within the pulmonary arterial tree to suggest pulmonary embolism. Heart size is normal. There is no significant pericardial fluid, thickening or pericardial calcification. No atherosclerotic calcifications are noted in the thoracic aorta or the coronary arteries. Right internal jugular single-lumen Port-A-Cath with tip terminating in the distal superior vena cava. Mediastinum/Nodes: Multiple prominent borderline enlarged mediastinal lymph nodes are noted, nonspecific, but similar to prior studies, presumably benign. Moderate hiatal hernia. No axillary lymphadenopathy. Lungs/Pleura: Widespread but patchy areas of airspace consolidation scattered in a peribronchovascular distribution throughout the lungs bilaterally, most severe in the lower lobes of the lungs. No pleural effusions. Upper Abdomen: Unremarkable. Musculoskeletal: There are no aggressive appearing lytic or blastic lesions noted in the visualized portions of the skeleton. Review of the MIP images confirms the above findings. IMPRESSION: 1. No evidence of pulmonary embolism. 2. Extensive multilobar bilateral bronchopneumonia, most severe in the lower lobes of the lungs bilaterally, as above. Electronically Signed   By: Trudie Reed M.D.   On: 06/25/2022 06:29   DG Chest Portable 1 View  Result Date: 06/25/2022 CLINICAL DATA:  Dyspnea, hemoptysis and shortness breath. EXAM: PORTABLE CHEST 1 VIEW COMPARISON:  PA and lateral 01/15/2022 FINDINGS: There is overlying monitor wiring. Stable right chest MediPort with IJ approach catheter in the distal SVC. The heart has mildly  enlarged. There is increased central vascular prominence and basilar interstitial edema with small pleural effusions beginning to form. There are increased infrahilar opacities which could be due to alveolar edema and/or pneumonia. Atelectasis could appear similar. No other focal lung opacity is seen. The mediastinum is normally outlined. There is mild thoracic levoscoliosis with degenerative changes. No acute osseous findings. IMPRESSION: 1. Increased central vascular prominence and basilar interstitial edema consistent with CHF or fluid overload. 2. Increased infrahilar opacities which could be alveolar edema and/or pneumonia. Atelectasis could appear similar. 3. Clinical correlation and radiographic follow-up recommended. Electronically Signed   By: Almira Bar M.D.   On: 06/25/2022 05:37          No data to display          No results found for: "NITRICOXIDE"      Assessment & Plan:   Recurrent pneumonia Recent hospitalization for recurrent pneumonia in the setting of hiatal  hernia and gastric reflux.  She has completed antimicrobial therapy.  She is clinically improving.  She is still experiencing oxygen desaturations with exertion; SpO2 low of 85% on room air during walk test today.  Encouraged her to continue wearing her oxygen with activity and at night to maintain goal greater than 88 to 90%.  She will continue working on reflux precautions and pulmonary hygiene.  Advised that she could restart graded exercises.  She will monitor her symptoms and notify us of any worsening.  Strict return/ED precautions advised.  Plan to repeat imaging in 4 weeks at follow-up.  Patient Instructions  Continue Albuterol inhaler 2 puffs or 3 mL neb every 6 hours as needed for shortness of breath or wheezing. Notify if symptoms persist despite rescue inhaler/neb use.  Continue cetirizine (zyrtec) 1 tab daily Continue flonase 2 sprays each nostril daily  Continue mucinex 600 Twice daily as needed for  congestion/cough Continue omeprazole 40 mg Twice daily  Continue Stiolto 2 puffs daily Continue supplemental oxygen 2 lpm with activity for goal >88-90%. We will rewalk you when you come back to see if we can get you off of your oxygen during the day   Add on pepcid 20 mg at bedtime. If you don't notice any improvement in your reflux with this, you can stop Avoid eating 3 hours before bed. Limit fluids before bed as well. Sleep with wedge pillow and keep head elevated at night >30 degrees   You have mild sleep apnea with a component of hypoventilation syndrome. We will hold off on CPAP therapy for now, given the troubles with reflux. Use supplemental oxygen 2 lpm at night while you sleep. We will complete overnight oxygen study on this to see if we need to make any adjustments.   Follow up in 4 weeks with Dr. Judeth Horn and repeat chest x ray. If symptoms do not improve or worsen, please contact office for sooner follow up or seek emergency care.    GERD (gastroesophageal reflux disease) She is still having some residual breakthrough GERD symptoms, primarily at night.  She is already on twice daily PPI and is adhering to GERD prevention measures.  Symptoms are likely not going to drastically improve until her hiatal hernia is repaired.  We will try to see if adding on Pepcid at bedtime will help provide some relief.  Acute respiratory failure with hypoxia (HCC) She continues to require supplemental oxygen with activity.  She had a walk test today with desaturation 85%.  She will continue 2 L/min with activity and at night.  We will obtain Lindell Spar to ensure this corrects the nocturnal hypoxia as she does have some mild underlying OSA as well.  Will try to avoid CPAP if at all possible given her reflux.  Can we walk her at follow-up to see if she is still requiring oxygen at that time.   I spent 45 minutes of dedicated to the care of this patient on the date of this encounter to include pre-visit  review of records, face-to-face time with the patient discussing conditions above, post visit ordering of testing, clinical documentation with the electronic health record, making appropriate referrals as documented, and communicating necessary findings to members of the patients care team.  Noemi Chapel, NP 07/08/2022  Pt aware and understands NP's role.

## 2022-07-08 NOTE — Patient Instructions (Addendum)
Continue Albuterol inhaler 2 puffs or 3 mL neb every 6 hours as needed for shortness of breath or wheezing. Notify if symptoms persist despite rescue inhaler/neb use.  Continue cetirizine (zyrtec) 1 tab daily Continue flonase 2 sprays each nostril daily  Continue mucinex 600 Twice daily as needed for congestion/cough Continue omeprazole 40 mg Twice daily  Continue Stiolto 2 puffs daily Continue supplemental oxygen 2 lpm with activity for goal >88-90%. We will rewalk you when you come back to see if we can get you off of your oxygen during the day   Add on pepcid 20 mg at bedtime. If you don't notice any improvement in your reflux with this, you can stop Avoid eating 3 hours before bed. Limit fluids before bed as well. Sleep with wedge pillow and keep head elevated at night >30 degrees   You have mild sleep apnea with a component of hypoventilation syndrome. We will hold off on CPAP therapy for now, given the troubles with reflux. Use supplemental oxygen 2 lpm at night while you sleep. We will complete overnight oxygen study on this to see if we need to make any adjustments.   Follow up in 4 weeks with Dr. Silas Thompson and repeat chest x ray. If symptoms do not improve or worsen, please contact office for sooner follow up or seek emergency care.

## 2022-07-08 NOTE — Assessment & Plan Note (Signed)
Recent hospitalization for recurrent pneumonia in the setting of hiatal hernia and gastric reflux.  She has completed antimicrobial therapy.  She is clinically improving.  She is still experiencing oxygen desaturations with exertion; SpO2 low of 85% on room air during walk test today.  Encouraged her to continue wearing her oxygen with activity and at night to maintain goal greater than 88 to 90%.  She will continue working on reflux precautions and pulmonary hygiene.  Advised that she could restart graded exercises.  She will monitor her symptoms and notify us of any worsening.  Strict return/ED precautions advised.  Plan to repeat imaging in 4 weeks at follow-up.  Patient Instructions  Continue Albuterol inhaler 2 puffs or 3 mL neb every 6 hours as needed for shortness of breath or wheezing. Notify if symptoms persist despite rescue inhaler/neb use.  Continue cetirizine (zyrtec) 1 tab daily Continue flonase 2 sprays each nostril daily  Continue mucinex 600 Twice daily as needed for congestion/cough Continue omeprazole 40 mg Twice daily  Continue Stiolto 2 puffs daily Continue supplemental oxygen 2 lpm with activity for goal >88-90%. We will rewalk you when you come back to see if we can get you off of your oxygen during the day   Add on pepcid 20 mg at bedtime. If you don't notice any improvement in your reflux with this, you can stop Avoid eating 3 hours before bed. Limit fluids before bed as well. Sleep with wedge pillow and keep head elevated at night >30 degrees   You have mild sleep apnea with a component of hypoventilation syndrome. We will hold off on CPAP therapy for now, given the troubles with reflux. Use supplemental oxygen 2 lpm at night while you sleep. We will complete overnight oxygen study on this to see if we need to make any adjustments.   Follow up in 4 weeks with Dr. Silas Flood and repeat chest x ray. If symptoms do not improve or worsen, please contact office for sooner follow  up or seek emergency care.

## 2022-07-08 NOTE — Assessment & Plan Note (Signed)
She is still having some residual breakthrough GERD symptoms, primarily at night.  She is already on twice daily PPI and is adhering to GERD prevention measures.  Symptoms are likely not going to drastically improve until her hiatal hernia is repaired.  We will try to see if adding on Pepcid at bedtime will help provide some relief.

## 2022-07-11 ENCOUNTER — Encounter: Payer: Self-pay | Admitting: Nurse Practitioner

## 2022-07-21 NOTE — Progress Notes (Unsigned)
    SUBJECTIVE:   CHIEF COMPLAINT / HPI:   Low grade fever x 2 days  H/o aspiration PNA x 3  PERTINENT  PMH / PSH: ***  OBJECTIVE:   LMP 10/16/2013 (Approximate)  ***  General: NAD, pleasant, able to participate in exam Cardiac: RRR, no murmurs. Respiratory: CTAB, normal effort, No wheezes, rales or rhonchi Abdomen: Bowel sounds present, nontender, nondistended Extremities: no edema or cyanosis. Skin: warm and dry, no rashes noted Neuro: alert, no obvious focal deficits Psych: Normal affect and mood  ASSESSMENT/PLAN:   No problem-specific Assessment & Plan notes found for this encounter.     Dr. Elberta Fortis, DO [place] [facility]    {    This will disappear when note is signed, click to select method of visit    :1}

## 2022-07-22 ENCOUNTER — Ambulatory Visit
Admission: RE | Admit: 2022-07-22 | Discharge: 2022-07-22 | Disposition: A | Discharge status: Home / Self Care | Payer: Medicare HMO | Source: Ambulatory Visit | Attending: Family Medicine | Admitting: Family Medicine

## 2022-07-22 ENCOUNTER — Telehealth: Payer: Self-pay | Admitting: Family Medicine

## 2022-07-22 ENCOUNTER — Encounter: Payer: Self-pay | Admitting: Family Medicine

## 2022-07-22 ENCOUNTER — Ambulatory Visit (INDEPENDENT_AMBULATORY_CARE_PROVIDER_SITE_OTHER): Payer: Medicare HMO | Admitting: Family Medicine

## 2022-07-22 VITALS — BP 104/82 | HR 79 | Temp 98.2°F | Ht 65.0 in | Wt 356.6 lb

## 2022-07-22 DIAGNOSIS — R051 Acute cough: Secondary | ICD-10-CM

## 2022-07-22 DIAGNOSIS — J0101 Acute recurrent maxillary sinusitis: Secondary | ICD-10-CM

## 2022-07-22 DIAGNOSIS — J988 Other specified respiratory disorders: Secondary | ICD-10-CM | POA: Diagnosis not present

## 2022-07-22 DIAGNOSIS — U071 COVID-19: Secondary | ICD-10-CM

## 2022-07-22 DIAGNOSIS — J019 Acute sinusitis, unspecified: Secondary | ICD-10-CM

## 2022-07-22 LAB — POC SOFIA 2 FLU + SARS ANTIGEN FIA
Influenza A, POC: NEGATIVE
Influenza B, POC: NEGATIVE
SARS Coronavirus 2 Ag: POSITIVE — AB

## 2022-07-22 MED ORDER — MOLNUPIRAVIR EUA 200MG CAPSULE
4.0000 | ORAL_CAPSULE | Freq: Two times a day (BID) | ORAL | 0 refills | Status: AC
Start: 2022-07-22 — End: 2022-07-27

## 2022-07-22 MED ORDER — DOXYCYCLINE HYCLATE 100 MG PO TABS
100.0000 mg | ORAL_TABLET | Freq: Two times a day (BID) | ORAL | 0 refills | Status: AC
Start: 2022-07-22 — End: 2022-07-27

## 2022-07-22 MED ORDER — FLUTICASONE PROPIONATE 50 MCG/ACT NA SUSP
2.0000 | Freq: Two times a day (BID) | NASAL | 5 refills | Status: AC
Start: 2022-07-22 — End: ?

## 2022-07-22 MED ORDER — GUAIFENESIN ER 600 MG PO TB12
600.0000 mg | ORAL_TABLET | Freq: Two times a day (BID) | ORAL | 1 refills | Status: AC | PRN
Start: 2022-07-22 — End: 2022-08-19

## 2022-07-22 NOTE — Telephone Encounter (Signed)
Spoke with patient regarding positive COVID result on in office swab and CXR results. Given h/o multiple recent hospitalizations for PNA and home oxygen use, patient is high risk for severe infection. Recommend treating for COVID and possible underlying bacterial infection. Sent in Molnupiravir  BID x 5 days and Doxycycline  BID x 5 days (penicillin allergy) for management. Patient in agreement with plan.  Elberta Fortis, DO

## 2022-07-22 NOTE — Patient Instructions (Signed)
It was wonderful to see you today! Thank you for choosing [facility].   Please bring ALL of your medications with you to every visit.   Today we talked about:  I refilled your mucus relief and would like you to use Flonase 2 sprays in each nostril twice per day. I would recommend getting a chest x-ray give your history to make sure you are not starting to develop an infection. Please return if you continue to have high fevers or difficulty breathing. I will call you about the COVID and flu results.  Please follow up for worsening symptoms   Call the clinic at [phone] if your symptoms worsen or you have any concerns.  Please be sure to schedule follow up at the front desk before you leave today.   Elberta Fortis, DO Family Medicine

## 2022-07-22 NOTE — Assessment & Plan Note (Signed)
Likely viral illness but high risk for decompensation given h/o recurrent PNA requiring multiple hospitalizations. -COVID and flu swabs in office -CXR ordered given high risk, consider abx coverage pending results -Symptom support with Guaifenesin and recommend Flonase 2 sprays in each nostril BID

## 2022-07-23 NOTE — Telephone Encounter (Signed)
ATC x1 LVM for patient to call our office back regarding sleep study.  

## 2022-08-04 ENCOUNTER — Telehealth: Payer: Self-pay | Admitting: Nurse Practitioner

## 2022-08-05 ENCOUNTER — Ambulatory Visit (INDEPENDENT_AMBULATORY_CARE_PROVIDER_SITE_OTHER): Payer: Medicare HMO | Admitting: Pulmonary Disease

## 2022-08-05 ENCOUNTER — Ambulatory Visit (INDEPENDENT_AMBULATORY_CARE_PROVIDER_SITE_OTHER): Payer: Medicare HMO

## 2022-08-05 ENCOUNTER — Other Ambulatory Visit: Payer: Self-pay | Admitting: Pulmonary Disease

## 2022-08-05 ENCOUNTER — Encounter: Payer: Self-pay | Admitting: Pulmonary Disease

## 2022-08-05 VITALS — BP 126/72 | HR 71 | Temp 97.8°F | Ht 65.0 in | Wt 359.2 lb

## 2022-08-05 DIAGNOSIS — G4733 Obstructive sleep apnea (adult) (pediatric): Secondary | ICD-10-CM | POA: Diagnosis not present

## 2022-08-05 DIAGNOSIS — J189 Pneumonia, unspecified organism: Secondary | ICD-10-CM

## 2022-08-05 DIAGNOSIS — J9601 Acute respiratory failure with hypoxia: Secondary | ICD-10-CM

## 2022-08-05 NOTE — Patient Instructions (Signed)
Nice to see you again  No changes to medication  I am glad you are feeling better  I think be great to get back to the gym, back to exercising  Continue Stiolto every day.  Use albuterol as needed, use before you go to the gym to see if it helps a bit more.  If you are feeling good the next 4 weeks, contact your nurse navigator or oncology team and see about rescheduling the port removal.  Return to clinic in 6 months or sooner as needed with Dr. Judeth Horn

## 2022-08-05 NOTE — Progress Notes (Signed)
@Patient  ID: [name], female    DOB: 24-Sep-1965, 57 y.o.   MRN: [id number]  Chief Complaint  Patient presents with   Follow-up    States feeling better, having fatigue, trying to get a regular routine, trying to get started in exercise program    Referring provider: Latrelle Dodrill, MD  HPI:   57 y.o. woman whom we are seeing in for in follow up of recurrent pneumonia due to aspiration.  Discharge summary 06/2022 reviewed.  Most recent pulmonary notes via Rhunette Croft, NP reviewed.  Most recent PCP note reviewed.  Unfortunate developed acute shortness of breath.  Coughing fit.  Went to the ED.  Chest x-ray with bibasilar infiltrates.  CTA PE protocol without PE but did note a dense bilateral lower lobe consolidation as well as scattered nodular opacities in the right middle lobe on my review and interpretation.  Treatment antibiotics.  Placed on oxygen.  Oxygen was weaned but she was discharged on oxygen.  She return to clinic/05/2022 for hospital follow-up.  Desaturated 87% on room air.  Encouraged to continue oxygen.  Over the last few weeks she continues to improve.  Feeling back to baseline.  Not using oxygen during the day.  Stayed in the mid 90s or higher.  She is using at night continuously.  She feels that she is sleeping better with this.  Unfortunate, about 2 weeks ago contracted COVID.  Has recovered from this nicely.   HPI at initial visit: Patient reports history of recurrent pneumonia.  For started after diagnosis of concomitant ovarian and endometrial cancer.  She received chemotherapy for this.  Also had hysterectomy.  Last chemotherapy dose about 6 years ago.  Notably, port still in place.  Ever since then she has gotten pneumonias at least once a year.  Treated with antibiotics and usually symptoms improved with course of weeks.  She reports recent aspiration pneumonia, diagnosed earlier in 2023.  Treated with antibiotics improved.  She had recurrent pneumonia  11/2021 on chest x-ray, my review and interpretation reveals nodular opacities bilaterally, right greater than left with more confluent densities right lower lung field.  Symptoms were preceded by sinus infection.  She received antibiotics for this.  Developed pneumonia in spite of this.  Required 2 additional courses of antibiotics.  Repeat chest x-ray 12/2021 my review interpretation reveals similar distribution of nodular opacities bilaterally right greater than left with interval decrease in size and decrease confluence in the right lower lung field indicative of improving infectious pneumonia.  She had the Streptococcus immunization last year.  She reports he has a hiatal hernia.  She reports significant reflux symptoms.  On a PPI which helps with the burning but bad reflux.  She wakes up on a nightly basis with coughing.  PMH: Ovarian and endometrial cancer in remission, GERD, childhood asthma, history of recurrent PE on chronic Xarelto Surgical history: Hysterectomy, bowel resection, C-section, ventral hernia repair, tonsillectomy Family history: Mother with diabetes, hypertension, hyperlipidemia, father with hyperlipidemia Social history: Never smoker, lives in [place] / Pulmonary Flowsheets:   ACT:      No data to display           MMRC:     No data to display           Epworth:      No data to display           Tests:   FENO:  No results found for: "NITRICOXIDE"  PFT:  No data to display           WALK:     07/08/2022   10:55 AM  SIX MIN WALK  Supplimental Oxygen during Test? (L/min) No  Tech Comments: Pt slow paced drop and the end of the lap, she recovered once sitting back to 96%    Imaging: Personally reviewed and as per EMR and discussion in this note DG Chest 2 View  Result Date: 07/25/2022 CLINICAL DATA:  Productive cough with yellow mucus and fever for 3 days, recent pneumonia, history hypertension EXAM: CHEST - 2  VIEW COMPARISON:  06/27/2022 FINDINGS: RIGHT jugular Port-A-Cath with tip projecting over SVC. Normal heart size, mediastinal contours, and pulmonary vascularity normal. Lungs clear. No pulmonary infiltrate, pleural effusion, or pneumothorax. Osseous structures unremarkable. IMPRESSION: No acute abnormalities. Electronically Signed   By: Ulyses Southward M.D.   On: 07/25/2022 16:30    Lab Results: Personally reviewed CBC    Component Value Date/Time   WBC 6.7 07/02/2022 1452   WBC 8.7 06/28/2022 0252   RBC 4.01 07/02/2022 1452   RBC 3.15 (L) 06/28/2022 0252   HGB 10.3 (L) 07/02/2022 1452   HGB 11.6 01/17/2016 0928   HCT 33.8 (L) 07/02/2022 1452   HCT 35.2 01/17/2016 0928   PLT 319 07/02/2022 1452   MCV 84 07/02/2022 1452   MCV 93.1 01/17/2016 0928   MCH 25.7 (L) 07/02/2022 1452   MCH 26.3 06/28/2022 0252   MCHC 30.5 (L) 07/02/2022 1452   MCHC 30.6 06/28/2022 0252   RDW 15.0 07/02/2022 1452   RDW 14.0 01/17/2016 0928   LYMPHSABS 1.8 12/17/2021 1447   LYMPHSABS 1.9 01/17/2016 0928   MONOABS 0.6 11/26/2020 1108   MONOABS 0.5 01/17/2016 0928   EOSABS 0.1 12/17/2021 1447   BASOSABS 0.0 12/17/2021 1447   BASOSABS 0.0 01/17/2016 0928    BMET    Component Value Date/Time   NA 142 07/02/2022 1452   NA 140 01/17/2016 0928   K 4.3 07/02/2022 1452   K 4.8 01/17/2016 0928   CL 102 07/02/2022 1452   CO2 25 07/02/2022 1452   CO2 22 01/17/2016 0928   GLUCOSE 131 (H) 07/02/2022 1452   GLUCOSE 104 (H) 06/28/2022 0252   GLUCOSE 103 01/17/2016 0928   BUN 16 07/02/2022 1452   BUN 28.7 (H) 01/17/2016 0928   CREATININE 1.11 (H) 07/02/2022 1452   CREATININE 1.1 01/17/2016 0928   CALCIUM 9.9 07/02/2022 1452   CALCIUM 10.0 01/17/2016 0928   GFRNONAA 56 (L) 06/28/2022 0252   GFRNONAA 77 02/26/2015 0945   GFRAA 59 (L) 07/20/2019 1004   GFRAA 89 02/26/2015 0945    BNP    Component Value Date/Time   BNP 68.3 06/25/2022 0452    ProBNP No results found for: "PROBNP"  Specialty Problems        Pulmonary Problems   OSA (obstructive sleep apnea)   Bronchospasm   Acute recurrent maxillary sinusitis   Multifocal pneumonia   Acute respiratory failure with hypoxia (HCC)   Aspiration into respiratory tract   Recurrent pneumonia   Congestion of upper airway    Allergies  Allergen Reactions   Penicillins Rash    Has patient had a PCN reaction causing immediate rash, facial/tongue/throat swelling, SOB or lightheadedness with hypotension: no Has patient had a PCN reaction causing severe rash involving mucus membranes or skin necrosis: no Has patient had a PCN reaction that required hospitalization in the hospital at the time Has patient had a PCN reaction occurring  within the last 10 years: no If all of the above answers are "NO", then may proceed with Cephalosporin use.     Immunization History  Administered Date(s) Administered   Influenza Split 06/10/2011   Influenza,inj,Quad PF,6+ Mos 12/12/2014, 01/17/2016, 12/04/2016, 01/14/2018, 02/01/2019, 01/05/2020, 12/20/2020, 01/09/2022   Moderna Sars-Covid-2 Vaccination 06/24/2019, 07/25/2019   PFIZER(Purple Top)SARS-COV-2 Vaccination 03/15/2020   PNEUMOCOCCAL CONJUGATE-20 12/20/2020   PPD Test 02/02/2019   Td 07/11/2008   Tdap 05/30/2022    Past Medical History:  Diagnosis Date   Allergy    Anemia    Anxiety    Arthritis    Blood transfusion without reported diagnosis    Cough    Cough 06/11/2018   Difficulty sleeping 06/11/2018   Diverticulitis with perforation 2010   DVT (deep vein thrombosis) in pregnancy 11/24/2016   DVT, lower extremity (HCC)    Endometrial cancer (HCC)    Enlarged lymph nodes 08/13/2017   Family history of adverse reaction to anesthesia    pts daughter had difficulty awakening following anesthesia, long time to wake up   GERD (gastroesophageal reflux disease)    Headache    Hemoptysis 04/22/2018   History of bronchitis    History of ear infections    Hospital discharge follow-up 03/02/2019    Hx of migraines    Hyperlipidemia    Hypertension    IBS (irritable bowel syndrome)    Kidney infection    Lupus (systemic lupus erythematosus) (HCC) 02/2017   Morbid obesity (HCC)    Neck pain 01/13/2019   Ovarian cancer (HCC) dx'd 01/2015   PONV (postoperative nausea and vomiting)    Port-A-Cath in place    Right side   Portacath in place 09/12/2015   Pre-diabetes    Pyelonephritis    Right knee pain 11/28/2011   Scleroderma (HCC)    Screen for colon cancer 03/02/2019   Slow rate of speech 08/22/2015   Chronic from CVA.  She also has loss of left nasolabial fold and slight left facial droop/small asymmetry on the left side secondary to CVA   UTI (urinary tract infection) 05/07/2018    Tobacco History: Social History   Tobacco Use  Smoking Status Never  Smokeless Tobacco Never   Counseling given: Not Answered   Continue to not smoke  Outpatient Encounter Medications as of 08/05/2022  Medication Sig   acetaminophen (TYLENOL) 500 MG tablet Take 2 tablets (1,000 mg total) by mouth every 6 (six) hours as needed for moderate pain.   albuterol (PROVENTIL) (2.5 MG/3ML) 0.083% nebulizer solution Take 3 mLs (2.5 mg total) by nebulization every 6 (six) hours as needed for wheezing or shortness of breath.   albuterol (VENTOLIN HFA) 108 (90 Base) MCG/ACT inhaler INHALE 2 PUFFS INTO THE LUNGS EVERY 6 HOURS AS NEEDED FOR WHEEZING OR SHORTNESS OF BREATH   amLODipine-olmesartan (AZOR) 5-20 MG tablet TAKE 1 TABLET BY MOUTH DAILY   B-D UF III MINI PEN NEEDLES 31G X 5 MM MISC    baclofen (LIORESAL) 10 MG tablet TAKE 1 TABLET(10 MG) BY MOUTH TWICE DAILY AS NEEDED FOR MUSCLE SPASMS (Patient taking differently: Take 10 mg by mouth 2 (two) times daily as needed for muscle spasms.)   benzonatate (TESSALON) 100 MG capsule Take 1 capsule (100 mg total) by mouth 3 (three) times daily as needed for cough.   cetirizine (ZYRTEC) 10 MG tablet Take 10 mg by mouth daily.   Cholecalciferol (VITAMIN D) 50 MCG  (2000 UT) CAPS Take 2,000 Units by mouth daily.  diclofenac Sodium (VOLTAREN) 1 % GEL Apply to knee once daily as needed (Patient taking differently: Apply 4 g topically daily.)   ezetimibe (ZETIA) 10 MG tablet Take 1 tablet (10 mg total) by mouth daily.   fesoterodine (TOVIAZ) 8 MG TB24 tablet TAKE 1 TABLET(8 MG) BY MOUTH DAILY (Patient taking differently: Take 8 mg by mouth daily.)   fluticasone (FLONASE) 50 MCG/ACT nasal spray Place 2 sprays into both nostrils in the morning and at bedtime.   furosemide (LASIX) 20 MG tablet Take 20 mg by mouth daily as needed for edema.   guaiFENesin (MUCINEX) 600 MG 12 hr tablet Take 1 tablet (600 mg total) by mouth 2 (two) times daily as needed for up to 28 days for cough or to loosen phlegm.   Insulin Pen Needle 31G X 5 MM MISC    naltrexone (DEPADE) 50 MG tablet Take 25 mg by mouth daily.   omeprazole (PRILOSEC) 40 MG capsule Take 1 capsule (40 mg total) by mouth in the morning and at bedtime. (Patient taking differently: Take 40 mg by mouth daily.)   ondansetron (ZOFRAN) 4 MG tablet Take 1 tablet (4 mg total) by mouth every 6 (six) hours as needed for nausea.   Tiotropium Bromide-Olodaterol (STIOLTO RESPIMAT) 2.5-2.5 MCG/ACT AERS Inhale 2 puffs into the lungs daily.   triamcinolone cream (KENALOG) 0.1 % Apply 1 Application topically 2 (two) times daily as needed. APPLY TO THE AFFECTED AREA TWICE DAILY AS NEEDED FOR SKIN IRRITATION (Patient taking differently: Apply 1 Application topically 2 (two) times daily as needed (Skin irritation.).)   valACYclovir (VALTREX) 500 MG tablet TAKE 1 TABLET(500 MG) BY MOUTH TWICE DAILY FOR 3 DAYS. START OF OUTBREAK (Patient taking differently: Take 500 mg by mouth 2 (two) times daily as needed (Flare ups).)   XARELTO 20 MG TABS tablet TAKE 1 TABLET(20 MG) BY MOUTH EVERY MORNING (Patient taking differently: Take 20 mg by mouth in the morning.)   cefdinir (OMNICEF) 300 MG capsule Take 1 capsule (300 mg total) by mouth every 12  (twelve) hours. (Patient not taking: Reported on 08/05/2022)   No facility-administered encounter medications on file as of 08/05/2022.     Review of Systems  Review of Systems  N/a Physical Exam  BP 126/72 (BP Location: Right Wrist, Patient Position: Sitting, Cuff Size: Normal)   Pulse 71   Temp 97.8 F (36.6 C) (Oral)   Ht 5\' 5"  (1.651 m)   Wt (!) 359 lb 3.2 oz (162.9 kg)   LMP 10/16/2013 (Approximate)   SpO2 97% Comment: RA  BMI 59.77 kg/m   Wt Readings from Last 5 Encounters:  08/05/22 (!) 359 lb 3.2 oz (162.9 kg)  07/22/22 (!) 356 lb 9.6 oz (161.8 kg)  07/08/22 (!) 356 lb 3.2 oz (161.6 kg)  07/02/22 (!) 355 lb 3.2 oz (161.1 kg)  06/25/22 (!) 360 lb 0.2 oz (163.3 kg)    BMI Readings from Last 5 Encounters:  08/05/22 59.77 kg/m  07/22/22 59.34 kg/m  07/08/22 59.27 kg/m  07/02/22 59.11 kg/m  06/25/22 59.91 kg/m     Physical Exam General: Sitting in chair, no acute distress Eyes: EOMI, no icterus Neck: Supple, no JVP Pulmonary: Clear, normal work of breathing Cardiovascular warm, no edema Abdomen: Nondistended, bowel sounds present MSK: No synovitis, no joint effusion Neuro: Normal gait, no weakness Psych: Normal mood, full affect   Assessment & Plan:   Recurrent pneumonias: Presumably normal immune system.  Immunoglobulin levels normal.  She has a hiatal hernia and endorses  coughing at night frequently.  High suspicion for silent or frank aspiration particular with lying supine related to hiatal hernia and reflux causing recurrent pneumonias and x-ray changes.  Most recent admission with aspiration pneumonia 06/2022, recovering nicely.  GERD/recurrent aspiration: Continue PPI.  Counseled lifestyle modification many which she is doing related to diet and time of eating.  Recommend wedge for elevating head post on bed.  Fortunately, she had recently ordered a wedge pillow.  If this is not comfortable instructed her on how to elevate head of bed with phone  that she has at home.  She has been losing weight and was congratulated on this effort.  Has additional 50 or so pounds to get before bariatric surgery and what sounds like plan Niesen fundoplication.  I think her symptoms of cough as well as recurrent pneumonias will be difficult to control with hiatal hernia unsecured.  Asthma: Diagnosed as a child.  Symptoms not too bad as older adult.  Does use albuterol with coughing fits and with some shortness of breath.  Particular when she is ill with pneumonia.  She does find this beneficial.  Sample of Anoro was helpful.  Stiolto appears preferred by [employer], new prescription for [employer] today.  Avoiding ICS given recurrent pneumonias.  Acute hypoxemic respiratory failure: No signs of severe aspiration pneumonia.  Weaned off oxygen during the day.  Using at night as below.  Obstructive sleep apnea: Home sleep test revealed OSA.  Ordered via Dr. Wynona Neat.  Continue nocturnal oxygen for now.     Return in about 6 months (around 02/04/2023) for f/u Dr. Judeth Horn.   Karren Burly, MD 08/05/2022  I spent 41 minutes in the care of the patient including review of records, coordination of care, face-to-face visit.

## 2022-08-06 NOTE — Telephone Encounter (Signed)
Called the pt and there was no answer- LMTCB    

## 2022-08-13 NOTE — Telephone Encounter (Signed)
Called the pt and there was no answer- LMTCB    

## 2022-08-20 ENCOUNTER — Other Ambulatory Visit: Payer: Self-pay

## 2022-08-20 ENCOUNTER — Telehealth: Payer: Self-pay

## 2022-08-20 DIAGNOSIS — I10 Essential (primary) hypertension: Secondary | ICD-10-CM

## 2022-08-20 NOTE — Telephone Encounter (Signed)
Patient calls nurse line reporting episodes of bloody sputum.   She reports symptoms started last night and into this morning. She reports 2 episodes total.  She reports a very small amount on a piece of tissue.   She reports she had similar symptoms last month when she tested positive for Covid. She also had a negative CXR.  She denies being SOB and she was speaking in full sentences. She denies fevers.   Patient scheduled for tomorrow for evaluation.   Precautions discussed in the meantime.

## 2022-08-20 NOTE — Telephone Encounter (Signed)
Agree with evaluation in person Latrelle Dodrill, MD

## 2022-08-21 ENCOUNTER — Encounter: Payer: Self-pay | Admitting: Family Medicine

## 2022-08-21 ENCOUNTER — Ambulatory Visit (INDEPENDENT_AMBULATORY_CARE_PROVIDER_SITE_OTHER): Payer: Medicare HMO | Admitting: Family Medicine

## 2022-08-21 VITALS — BP 130/82 | HR 81 | Wt 351.0 lb

## 2022-08-21 DIAGNOSIS — R042 Hemoptysis: Secondary | ICD-10-CM | POA: Diagnosis not present

## 2022-08-21 DIAGNOSIS — J189 Pneumonia, unspecified organism: Secondary | ICD-10-CM | POA: Diagnosis not present

## 2022-08-21 MED ORDER — ALBUTEROL SULFATE (2.5 MG/3ML) 0.083% IN NEBU: 2.5000 mg | INHALATION_SOLUTION | Freq: Four times a day (QID) | RESPIRATORY_TRACT | 1 refills | Status: DC | PRN

## 2022-08-21 NOTE — Progress Notes (Signed)
    SUBJECTIVE:   CHIEF COMPLAINT / HPI: bloody sputum   Coughing up blood past several days. Jaw and left ear hurting some. First started Sunday night. Has been consistent brown red coffee grain since the last. Coughing up yellow phelgm as well. No fevers. Ear pain started yesterday. No sore throat. No difficulty breathing. Using 2L oxygen at night. No new calf swelling, pain or redness.  Called yesterday reporting bloody sputum. Had previous symptoms last month when she test positive for COVID.  Hx of recurrent pneumonia, thought to be due to aspiration due to hiatal hernia and GERD. Sees pulmonology.  PERTINENT  PMH / PSH: HTN, CHF, OSA, CKD3, Hx of ovarian cancer  OBJECTIVE:   BP 130/82   Pulse 81   Wt (!) 351 lb (159.2 kg)   LMP 10/16/2013 (Approximate)   SpO2 97%   BMI 58.41 kg/m   General: NAD, well appearing HEENT: Moist mucous membranes, no posterior oropharyngeal erythema, bilateral TMs noninflamed, no effusion, clear cone of light Cardiovascular: RRR, no murmurs, no peripheral edema Respiratory: normal WOB on room air, no wheezes, ronchi or rales, mildly diminished breath sounds right lung base, otherwise good air movement bilaterally Extremities: Moving all 4 extremities equally, no peripheral edema, bilateral tenderness   ASSESSMENT/PLAN:   Bloody sputum Assessment & Plan: Differential includes but is not limited to recurrent pneumonia, PE, blood thinner, TB.  Reassuringly patient is stable on room air without increased work of breathing.  Reassuring lung exam.  Discussed with patient that CT would be necessary to rule out PE and she is at increased risk of bleeding while taking Xarelto.  Could be early stage recurrent pneumonia however no concurrent sick symptoms which would contrast to previous infections and hospitalizations.  Shared decision making, patient would like to monitor closely to see if improving as she has not had bloody sputum today.  If any change  (fever, shortness of breath, increased blood) instructed to call clinic so we may order CTA chest, and to schedule same-day appointment.  Consider TB testing at that time.  Low threshold for imaging given patient's high risk of further pneumonia. Strict return precautions addressed.   Recurrent pneumonia -     Albuterol Sulfate; Take 3 mLs (2.5 mg total) by nebulization every 6 (six) hours as needed for wheezing or shortness of breath.  Dispense: 150 mL; Refill: 1   Return in about 2 weeks (around 09/04/2022), or or earlier if not improving.  Celine Mans, MD [facility]

## 2022-08-21 NOTE — Assessment & Plan Note (Addendum)
Differential includes but is not limited to recurrent pneumonia, PE, blood thinner, TB.  Reassuringly patient is stable on room air without increased work of breathing.  Reassuring lung exam.  Discussed with patient that CT would be necessary to rule out PE and she is at increased risk of bleeding while taking Xarelto.  Could be early stage recurrent pneumonia however no concurrent sick symptoms which would contrast to previous infections and hospitalizations.  Shared decision making, patient would like to monitor closely to see if improving as she has not had bloody sputum today.  If any change (fever, shortness of breath, increased blood) instructed to call clinic so we may order CTA chest, and to schedule same-day appointment.  Consider TB testing at that time.  Low threshold for imaging given patient's high risk of further pneumonia. Strict return precautions addressed.

## 2022-08-21 NOTE — Patient Instructions (Signed)
It was great to see you! Thank you for allowing me to participate in your care!  I recommend that you always bring your medications to each appointment as this makes it easy to ensure we are on the correct medications and helps [place] not miss when refills are needed.  Our plans for today:  -We decided to watch and see whether your coughing up blood is improving over the next several days.  If is not improving by Monday please call the clinic so I may order CT of your lungs. -If you develop fever, difficulty breathing please return to care.    Please arrive 15 minutes PRIOR to your next scheduled appointment time! If you do not, this affects OTHER patients' care.  Take care and seek immediate care sooner if you develop any concerns.   Dr. Celine Mans, MD [facility]

## 2022-08-22 ENCOUNTER — Encounter: Payer: Self-pay | Admitting: *Deleted

## 2022-08-22 MED ORDER — AMLODIPINE-OLMESARTAN 5-20 MG PO TABS: 1.0000 | ORAL_TABLET | Freq: Every day | ORAL | 1 refills | Status: DC

## 2022-08-22 NOTE — Telephone Encounter (Signed)
Called the pt again and still no answer- LMTCB and will go ahead and mail letter to her   Looks like she had ov recently, but I can not tell that this was discussed

## 2022-09-02 ENCOUNTER — Other Ambulatory Visit: Payer: Self-pay | Admitting: Family Medicine

## 2022-09-15 NOTE — Telephone Encounter (Signed)
ATC X2 LVM for patient to call our office back.

## 2022-09-16 ENCOUNTER — Telehealth: Payer: Self-pay

## 2022-09-16 ENCOUNTER — Ambulatory Visit (INDEPENDENT_AMBULATORY_CARE_PROVIDER_SITE_OTHER): Payer: Medicare HMO | Admitting: Family Medicine

## 2022-09-16 ENCOUNTER — Ambulatory Visit
Admission: RE | Admit: 2022-09-16 | Discharge: 2022-09-16 | Disposition: A | Discharge status: Home / Self Care | Payer: Medicare HMO | Source: Ambulatory Visit | Attending: Family Medicine | Admitting: Family Medicine

## 2022-09-16 VITALS — BP 141/78 | HR 87 | Temp 98.8°F | Wt 366.2 lb

## 2022-09-16 DIAGNOSIS — R0602 Shortness of breath: Secondary | ICD-10-CM

## 2022-09-16 MED ORDER — AZITHROMYCIN 250 MG PO TABS
ORAL_TABLET | ORAL | 0 refills | Status: AC
Start: 2022-09-16 — End: 2022-09-21

## 2022-09-16 MED ORDER — CEFDINIR 300 MG PO CAPS
300.0000 mg | ORAL_CAPSULE | Freq: Two times a day (BID) | ORAL | 0 refills | Status: AC
Start: 2022-09-16 — End: 2022-09-26

## 2022-09-16 NOTE — Telephone Encounter (Signed)
Patient LVM on nurse line regarding concerns with antibiotics.   Returned call to patient. She was told that Cefdinir was a "cousin" to penicillins and was told to contact provider's office.   She has concerns about possible allergic reaction due to this. She wants to know if it is safe for her to take due to penicillin allergy. She states that she had hives and facial swelling with penicillin.  She does not remember if she had any issues with breathing, as reaction was over 20 years ago.   Will forward to Dr. Wynelle Link for further advisement.   Veronda Prude, RN

## 2022-09-16 NOTE — Patient Instructions (Addendum)
It was nice seeing you today!  Take antibiotics as prescribed.  Get your x-rays done here.  You do not need an appointment. [facility] Address: [address] [place], [place], [place] [address] Phone: [phone]   If you are feeling much worse, go ahead and go to the Emergency Department.  Stay well, Littie Deeds, MD [facility] [phone]  --  Make sure to check out at the front desk before you leave today.  Please arrive at least 15 minutes prior to your scheduled appointments.  If you had blood work today, I will send you a MyChart message or a letter if results are normal. Otherwise, I will give you a call.  If you had a referral placed, they will call you to set up an appointment. Please give [place] a call if you don't hear back in the next 2 weeks.  If you need additional refills before your next appointment, please call your pharmacy first.

## 2022-09-16 NOTE — Progress Notes (Signed)
SUBJECTIVE:   CHIEF COMPLAINT / HPI:  Chief Complaint  Patient presents with   coughing up blood   Sinus Problem   history of pnemonia    Patient was seen in clinic about 1 month ago on 5/16 for hemoptysis.  There was discussion about obtaining CTA chest but patient opted to defer and prefer to monitor symptoms. Today, reports symptoms resolved from the last visit.  Over the past 2 days, has had a lot of coughing, having some hemoptysis- did cough up a clot one time. Had temp of 100.59F last night, took some Tylenol. Reports O2 goes down to 83 without oxygen. Did a breathing treatment last night which helped a little bit. Has been sleeping in a recliner due to recurrent aspiration. Normally wears O2 at night only, but lately has been having to wear it during the day due to SOB which started yesterday. Reports myalgias, congestion, dry cough, facial pressure. Has had orthopnea, PND. Denies leg swelling. She took cefdinir before without issues. Reports she has been adherent to her rivaroxaban. She has furosemide prescribed as needed but reports that she never takes this.  Is working with bariatric surgery, was told needs to lose 50 more pounds before having bariatric surgery. Has graduation to go to Saturday.  PERTINENT  PMH / PSH: HFpEF, endometrial cancer, PE on rivaroxaban, OSA on nighttime 2L O2, scleroderma, CKD III, recurrent pneumonia felt to be secondary to aspiration due to hiatal hernia and GERD  CXR 08/05/2022-no active cardiopulmonary disease  Patient Care Team: Latrelle Dodrill, MD as PCP - General (Family Medicine) Parke Poisson, MD as PCP - Cardiology (Cardiology) [facility], Roe Rutherford, PA as Physician Assistant (Cardiology)   OBJECTIVE:   BP (!) 141/78   Pulse 87   Temp 98.8 F (37.1 C)   Wt (!) 366 lb 3.2 oz (166.1 kg)   LMP 10/16/2013 (Approximate)   SpO2 95%   BMI 60.94 kg/m   Physical Exam Constitutional:      General: She is not in acute  distress.    Appearance: She is obese.  HENT:     Head: Normocephalic and atraumatic.  Cardiovascular:     Rate and Rhythm: Normal rate and regular rhythm.  Pulmonary:     Effort: Pulmonary effort is normal. No respiratory distress.     Comments: Faint rales at the left lower lung field Musculoskeletal:     Cervical back: Neck supple.     Right lower leg: No edema.     Left lower leg: No edema.  Neurological:     Mental Status: She is alert.         09/16/2022    9:05 AM  Depression screen PHQ 2/9  Decreased Interest 0  Down, Depressed, Hopeless 0  PHQ - 2 Score 0  Altered sleeping 0  Tired, decreased energy 0  Change in appetite 0  Feeling bad or failure about yourself  0  Trouble concentrating 0  Moving slowly or fidgety/restless 0  Suicidal thoughts 0  PHQ-9 Score 0  Difficult doing work/chores Not difficult at all     {Show previous vital signs (optional):23777}    ASSESSMENT/PLAN:   1. Shortness of breath Patient presenting with dyspnea, hemoptysis, increased oxygen requirement (having to use during the day instead of only at nighttime) in the setting of cough, myalgias, congestion, other viral symptoms.  She has had recurrent episodes of pneumonia felt to be related to aspiration.  She is high risk for complications (  recent position requiring ICU stay earlier this year.  Will obtain CXR, labs, and empirically treat for pneumonia. - DG Chest 2 View; Future - cefdinir (OMNICEF) 300 MG capsule; Take 1 capsule (300 mg total) by mouth every 12 (twelve) hours for 10 days.  Dispense: 20 capsule; Refill: 0 - azithromycin (ZITHROMAX) 250 MG tablet; Take 2 tablets (500 mg total) by mouth daily for 1 day, THEN 1 tablet (250 mg total) daily for 4 days.  Dispense: 6 tablet; Refill: 0 - CBC with Differential - Basic Metabolic Panel - Brain natriuretic peptide   - schedule follow-up tomorrow, strict ED precautions provided  Return in about 1 day (around 09/17/2022) for f/u  SOB.   Littie Deeds, MD [facility]

## 2022-09-17 ENCOUNTER — Ambulatory Visit (INDEPENDENT_AMBULATORY_CARE_PROVIDER_SITE_OTHER): Payer: Medicare HMO | Admitting: Family Medicine

## 2022-09-17 ENCOUNTER — Encounter: Payer: Self-pay | Admitting: Family Medicine

## 2022-09-17 VITALS — BP 150/76 | HR 88 | Ht 65.0 in | Wt 366.2 lb

## 2022-09-17 DIAGNOSIS — D649 Anemia, unspecified: Secondary | ICD-10-CM

## 2022-09-17 DIAGNOSIS — R0602 Shortness of breath: Secondary | ICD-10-CM | POA: Diagnosis not present

## 2022-09-17 MED ORDER — FUROSEMIDE 20 MG PO TABS: 20.0000 mg | ORAL_TABLET | Freq: Every day | ORAL | 0 refills | Status: DC | PRN

## 2022-09-17 MED ORDER — FERROUS SULFATE 325 (65 FE) MG PO TBEC
325.0000 mg | DELAYED_RELEASE_TABLET | ORAL | 3 refills | Status: DC
Start: 2022-09-17 — End: 2023-01-19

## 2022-09-17 NOTE — Progress Notes (Signed)
SUBJECTIVE:   CHIEF COMPLAINT / HPI:  Chief Complaint  Patient presents with   SOB f/u   Sinus Problem    Patient seen by myself yesterday for dyspnea, cough, hemoptysis, and increased oxygen requirement. She was empirically treated with antibiotics for pneumonia, CXR and lab work obtained.  Today, patient reports that her shortness of breath has improved but still has low oxygen readings in the 80s and wearing her 2 L of oxygen. She is still having some congestion and facial pressure. Reports no further hemoptysis since yesterday.   PERTINENT  PMH / PSH: HFpEF, endometrial cancer, PE on rivaroxaban, OSA on nighttime 2L O2, scleroderma, CKD III, recurrent pneumonia felt to be secondary to aspiration due to hiatal hernia and GERD  Patient Care Team: Latrelle Dodrill, MD as PCP - General (Family Medicine) Parke Poisson, MD as PCP - Cardiology (Cardiology) [facility], Roe Rutherford, PA as Physician Assistant (Cardiology)   OBJECTIVE:   BP (!) 150/76   Pulse 88   Ht 5\' 5"  (1.651 m)   Wt (!) 366 lb 3.2 oz (166.1 kg)   LMP 10/16/2013 (Approximate)   SpO2 96%   BMI 60.94 kg/m   Physical Exam Constitutional:      General: She is not in acute distress. HENT:     Head: Normocephalic and atraumatic.  Cardiovascular:     Rate and Rhythm: Normal rate and regular rhythm.     Heart sounds: Normal heart sounds. No murmur heard. Pulmonary:     Effort: Pulmonary effort is normal. No respiratory distress.     Comments: Fine rales in the left lower base.  Nonlabored respirations on 2 L oxygen. Musculoskeletal:     Cervical back: Neck supple.     Right lower leg: No edema.     Left lower leg: No edema.  Neurological:     Mental Status: She is alert.         09/17/2022    8:58 AM  Depression screen PHQ 2/9  Decreased Interest 0  Down, Depressed, Hopeless 0  PHQ - 2 Score 0  Altered sleeping 0  Tired, decreased energy 0  Change in appetite 0  Feeling bad or failure  about yourself  0  Trouble concentrating 0  Moving slowly or fidgety/restless 0  Suicidal thoughts 0  PHQ-9 Score 0  Difficult doing work/chores Not difficult at all     Wt Readings from Last 3 Encounters:  09/17/22 (!) 366 lb 3.2 oz (166.1 kg)  09/16/22 (!) 366 lb 3.2 oz (166.1 kg)  08/21/22 (!) 351 lb (159.2 kg)      Last CBC Lab Results  Component Value Date   WBC 10.7 09/16/2022   HGB 9.4 (L) 09/16/2022   HCT 29.7 (L) 09/16/2022   MCV 79 09/16/2022   MCH 25.1 (L) 09/16/2022   RDW 15.5 (H) 09/16/2022   PLT 233 09/16/2022   Last metabolic panel Lab Results  Component Value Date   GLUCOSE 103 (H) 09/16/2022   NA 142 09/16/2022   K 4.2 09/16/2022   CL 106 09/16/2022   CO2 22 09/16/2022   BUN 21 09/16/2022   CREATININE 1.07 (H) 09/16/2022   EGFR 61 09/16/2022   CALCIUM 9.4 09/16/2022   PROT 6.3 (L) 06/28/2022   ALBUMIN 3.1 (L) 06/28/2022   LABGLOB 2.0 08/28/2020   AGRATIO 2.1 08/28/2020   BILITOT 0.6 06/28/2022   ALKPHOS 69 06/28/2022   AST 15 06/28/2022   ALT 10 06/28/2022   ANIONGAP  7 06/28/2022      ASSESSMENT/PLAN:   1. Shortness of breath Suspect pneumonia with possible component of HFpEF exacerbation. Patient has been empirically treated for pneumonia, subjectively feeling better but still having hypoxemia.  Hemoptysis is resolved for now. CXR reviewed with patient, formal radiology read not back yet, but on my interpretation appears to be possible vascular congestion or possible multifocal pneumonia. BNP still pending at this time. Given history of HFpEF and possible vascular congestion on imaging, I refilled her furosemide and advised her to try this to see if this helps with her symptoms. - furosemide (LASIX) 20 MG tablet; Take 1 tablet (20 mg total) by mouth daily as needed for edema or fluid.  Dispense: 30 tablet; Refill: 0 - continue antibiotics - f/u BNP - return precautions discussed  2. Normocytic anemia Labs reviewed with patient today,  she has some mild anemia hemoglobin 9.4, which is slightly decreased from last check 3 months ago but overall relatively stable.  Prior labs showed low saturation ratio suggesting iron deficiency anemia.  I advised that she start iron supplementation every other day. - ferrous sulfate 325 (65 FE) MG EC tablet; Take 1 tablet (325 mg total) by mouth every other day.; Refill: 3   Return if symptoms worsen or fail to improve.   Littie Deeds, MD [facility]

## 2022-09-17 NOTE — Telephone Encounter (Signed)
Issue was addressed during visit today. She has taken cefdinir previously without any reaction.

## 2022-09-17 NOTE — Patient Instructions (Addendum)
It was nice seeing you today!  Try taking the Lasix to see if this helps with your breathing.  Start iron supplement every other day.  Stay well, Alison Deeds, MD [facility] [phone]  --  Make sure to check out at the front desk before you leave today.  Please arrive at least 15 minutes prior to your scheduled appointments.  If you had blood work today, I will send you a MyChart message or a letter if results are normal. Otherwise, I will give you a call.  If you had a referral placed, they will call you to set up an appointment. Please give [place] a call if you don't hear back in the next 2 weeks.  If you need additional refills before your next appointment, please call your pharmacy first.

## 2022-09-18 LAB — CBC WITH DIFFERENTIAL/PLATELET
Basophils Absolute: 0.1 10*3/uL (ref 0.0–0.2)
Basos: 1 %
EOS (ABSOLUTE): 0.2 10*3/uL (ref 0.0–0.4)
Eos: 2 %
Hematocrit: 29.7 % — ABNORMAL LOW (ref 34.0–46.6)
Hemoglobin: 9.4 g/dL — ABNORMAL LOW (ref 11.1–15.9)
Immature Grans (Abs): 0 10*3/uL (ref 0.0–0.1)
Immature Granulocytes: 0 %
Lymphocytes Absolute: 1.1 10*3/uL (ref 0.7–3.1)
Lymphs: 11 %
MCH: 25.1 pg — ABNORMAL LOW (ref 26.6–33.0)
MCHC: 31.6 g/dL (ref 31.5–35.7)
MCV: 79 fL (ref 79–97)
Monocytes Absolute: 0.7 10*3/uL (ref 0.1–0.9)
Monocytes: 7 %
Neutrophils Absolute: 8.6 10*3/uL — ABNORMAL HIGH (ref 1.4–7.0)
Neutrophils: 79 %
Platelets: 233 10*3/uL (ref 150–450)
RBC: 3.75 x10E6/uL — ABNORMAL LOW (ref 3.77–5.28)
RDW: 15.5 % — ABNORMAL HIGH (ref 11.7–15.4)
WBC: 10.7 10*3/uL (ref 3.4–10.8)

## 2022-09-18 LAB — BRAIN NATRIURETIC PEPTIDE: BNP: 62 pg/mL (ref 0.0–100.0)

## 2022-09-18 LAB — BASIC METABOLIC PANEL
BUN/Creatinine Ratio: 20 (ref 9–23)
BUN: 21 mg/dL (ref 6–24)
CO2: 22 mmol/L (ref 20–29)
Calcium: 9.4 mg/dL (ref 8.7–10.2)
Chloride: 106 mmol/L (ref 96–106)
Creatinine, Ser: 1.07 mg/dL — ABNORMAL HIGH (ref 0.57–1.00)
Glucose: 103 mg/dL — ABNORMAL HIGH (ref 70–99)
Potassium: 4.2 mmol/L (ref 3.5–5.2)
Sodium: 142 mmol/L (ref 134–144)
eGFR: 61 mL/min/{1.73_m2} (ref >59)

## 2022-09-19 ENCOUNTER — Ambulatory Visit (INDEPENDENT_AMBULATORY_CARE_PROVIDER_SITE_OTHER): Payer: Medicare HMO

## 2022-09-19 VITALS — Ht 65.0 in | Wt 366.0 lb

## 2022-09-19 DIAGNOSIS — Z0000 Encounter for general adult medical examination without abnormal findings: Secondary | ICD-10-CM

## 2022-09-19 NOTE — Patient Instructions (Addendum)
[name] , Thank you for taking time to come for your Medicare Wellness Visit. I appreciate your ongoing commitment to your health goals. Please review the following plan we discussed and let me know if I can assist you in the future.   These are the goals we discussed:  Goals      Remain active and independent        This is a list of the screening recommended for you and due dates:  Health Maintenance  Topic Date Due   Zoster (Shingles) Vaccine (1 of 2) Never done   COVID-19 Vaccine (4 - 2023-24 season) 12/06/2021   Flu Shot  11/06/2022   Colon Cancer Screening  07/20/2023   Medicare Annual Wellness Visit  09/19/2023   Mammogram  05/09/2024   DTaP/Tdap/Td vaccine (3 - Td or Tdap) 05/30/2032   Hepatitis C Screening  Completed   HIV Screening  Completed   HPV Vaccine  Aged Out   Pap Smear  Discontinued    Advanced directives: Information on Advanced Care Planning can be found at [facility] Advance Health Care Directives Advance Health Care Directives ([email or web address] Please bring a copy of your health care power of attorney and living will to the office to be added to your chart at your convenience.   Conditions/risks identified: Aim for 30 minutes of exercise or brisk walking, 6-8 glasses of water, and 5 servings of fruits and vegetables each day.   Next appointment: Follow up in one year for your annual wellness visit.   Preventive Care 40-64 Years, Female Preventive care refers to lifestyle choices and visits with your health care provider that can promote health and wellness. What does preventive care include? A yearly physical exam. This is also called an annual well check. Dental exams once or twice a year. Routine eye exams. Ask your health care provider how often you should have your eyes checked. Personal lifestyle choices, including: Daily care of your teeth and gums. Regular physical activity. Eating a healthy diet. Avoiding tobacco and  drug use. Limiting alcohol use. Practicing safe sex. Taking low-dose aspirin daily starting at age 43. Taking vitamin and mineral supplements as recommended by your health care provider. What happens during an annual well check? The services and screenings done by your health care provider during your annual well check will depend on your age, overall health, lifestyle risk factors, and family history of disease. Counseling  Your health care provider may ask you questions about your: Alcohol use. Tobacco use. Drug use. Emotional well-being. Home and relationship well-being. Sexual activity. Eating habits. Work and work Astron[employer]. Method of birth control. Menstrual cycle. Pregnancy history. Screening  You may have the following tests or measurements: Height, weight, and BMI. Blood pressure. Lipid and cholesterol levels. These may be checked every 5 years, or more frequently if you are over 23 years old. Skin check. Lung cancer screening. You may have this screening every year starting at age 59 if you have a 30-pack-year history of smoking and currently smoke or have quit within the past 15 years. Fecal occult blood test (FOBT) of the stool. You may have this test every year starting at age 51. Flexible sigmoidoscopy or colonoscopy. You may have a sigmoidoscopy every 5 years or a colonoscopy every 10 years starting at age 52. Hepatitis C blood test. Hepatitis B blood test. Sexually transmitted disease (STD) testing. Diabetes screening. This is done by checking your blood sugar (glucose) after you have not eaten for  a while (fasting). You may have this done every 1-3 years. Mammogram. This may be done every 1-2 years. Talk to your health care provider about when you should start having regular mammograms. This may depend on whether you have a family history of breast cancer. BRCA-related cancer screening. This may be done if you have a family history of breast, ovarian, tubal, or  peritoneal cancers. Pelvic exam and Pap test. This may be done every 3 years starting at age 45. Starting at age 49, this may be done every 5 years if you have a Pap test in combination with an HPV test. Bone density scan. This is done to screen for osteoporosis. You may have this scan if you are at high risk for osteoporosis. Discuss your test results, treatment options, and if necessary, the need for more tests with your health care provider. Vaccines  Your health care provider may recommend certain vaccines, such as: Influenza vaccine. This is recommended every year. Tetanus, diphtheria, and acellular pertussis (Tdap, Td) vaccine. You may need a Td booster every 10 years. Zoster vaccine. You may need this after age 40. Pneumococcal 13-valent conjugate (PCV13) vaccine. You may need this if you have certain conditions and were not previously vaccinated. Pneumococcal polysaccharide (PPSV23) vaccine. You may need one or two doses if you smoke cigarettes or if you have certain conditions. Talk to your health care provider about which screenings and vaccines you need and how often you need them. This information is not intended to replace advice given to you by your health care provider. Make sure you discuss any questions you have with your health care provider. Document Released: 04/20/2015 Document Revised: 12/12/2015 Document Reviewed: 01/23/2015 Elsevier Interactive Patient Education  2017 [employer].    Fall Prevention in the Home Falls can cause injuries. They can happen to people of all ages. There are many things you can do to make your home safe and to help prevent falls. What can I do on the outside of my home? Regularly fix the edges of walkways and driveways and fix any cracks. Remove anything that might make you trip as you walk through a door, such as a raised step or threshold. Trim any bushes or trees on the path to your home. Use bright outdoor lighting. Clear any walking  paths of anything that might make someone trip, such as rocks or tools. Regularly check to see if handrails are loose or broken. Make sure that both sides of any steps have handrails. Any raised decks and porches should have guardrails on the edges. Have any leaves, snow, or ice cleared regularly. Use sand or salt on walking paths during winter. Clean up any spills in your garage right away. This includes oil or grease spills. What can I do in the bathroom? Use night lights. Install grab bars by the toilet and in the tub and shower. Do not use towel bars as grab bars. Use non-skid mats or decals in the tub or shower. If you need to sit down in the shower, use a plastic, non-slip stool. Keep the floor dry. Clean up any water that spills on the floor as soon as it happens. Remove soap buildup in the tub or shower regularly. Attach bath mats securely with double-sided non-slip rug tape. Do not have throw rugs and other things on the floor that can make you trip. What can I do in the bedroom? Use night lights. Make sure that you have a light by your bed that is  easy to reach. Do not use any sheets or blankets that are too big for your bed. They should not hang down onto the floor. Have a firm chair that has side arms. You can use this for support while you get dressed. Do not have throw rugs and other things on the floor that can make you trip. What can I do in the kitchen? Clean up any spills right away. Avoid walking on wet floors. Keep items that you use a lot in easy-to-reach places. If you need to reach something above you, use a strong step stool that has a grab bar. Keep electrical cords out of the way. Do not use floor polish or wax that makes floors slippery. If you must use wax, use non-skid floor wax. Do not have throw rugs and other things on the floor that can make you trip. What can I do with my stairs? Do not leave any items on the stairs. Make sure that there are handrails  on both sides of the stairs and use them. Fix handrails that are broken or loose. Make sure that handrails are as long as the stairways. Check any carpeting to make sure that it is firmly attached to the stairs. Fix any carpet that is loose or worn. Avoid having throw rugs at the top or bottom of the stairs. If you do have throw rugs, attach them to the floor with carpet tape. Make sure that you have a light switch at the top of the stairs and the bottom of the stairs. If you do not have them, ask someone to add them for you. What else can I do to help prevent falls? Wear shoes that: Do not have high heels. Have rubber bottoms. Are comfortable and fit you well. Are closed at the toe. Do not wear sandals. If you use a stepladder: Make sure that it is fully opened. Do not climb a closed stepladder. Make sure that both sides of the stepladder are locked into place. Ask someone to hold it for you, if possible. Clearly mark and make sure that you can see: Any grab bars or handrails. First and last steps. Where the edge of each step is. Use tools that help you move around (mobility aids) if they are needed. These include: Canes. Walkers. Scooters. Crutches. Turn on the lights when you go into a dark area. Replace any light bulbs as soon as they burn out. Set up your furniture so you have a clear path. Avoid moving your furniture around. If any of your floors are uneven, fix them. If there are any pets around you, be aware of where they are. Review your medicines with your doctor. Some medicines can make you feel dizzy. This can increase your chance of falling. Ask your doctor what other things that you can do to help prevent falls. This information is not intended to replace advice given to you by your health care provider. Make sure you discuss any questions you have with your health care provider. Document Released: 01/18/2009 Document Revised: 08/30/2015 Document Reviewed:  04/28/2014 Elsevier Interactive Patient Education  2017 [employer].

## 2022-09-19 NOTE — Progress Notes (Signed)
Subjective:   [name] is a 57 y.o. female who presents for an Initial Medicare Annual Wellness Visit.  I connected with  [name] on 09/19/22 by a audio enabled telemedicine application and verified that I am speaking with the correct person using two identifiers.  Patient Location: Home  Provider Location: Home Office  I discussed the limitations of evaluation and management by telemedicine. The patient expressed understanding and agreed to proceed.  Review of Systems     Cardiac Risk Factors include: hypertension;obesity (BMI >30kg/m2);sedentary lifestyle     Objective:    Today's Vitals   09/19/22 1221  Weight: (!) 366 lb (166 kg)  Height: 5\' 5"  (1.651 m)   Body mass index is 60.91 kg/m.     09/19/2022   12:30 PM 09/17/2022    8:58 AM 09/16/2022    9:05 AM 06/25/2022    6:00 PM 06/11/2022   11:24 AM 05/30/2022    5:06 PM 05/26/2022   10:14 AM  Advanced Directives  Does Patient Have a Medical Advance Directive? No No No No No No No  Would patient like information on creating a medical advance directive? Yes (MAU/Ambulatory/Procedural Areas - Information given)   No - Patient declined No - Patient declined No - Patient declined No - Patient declined    Current Medications (verified) Outpatient Encounter Medications as of 09/19/2022  Medication Sig   acetaminophen (TYLENOL) 500 MG tablet Take 2 tablets (1,000 mg total) by mouth every 6 (six) hours as needed for moderate pain.   albuterol (PROVENTIL) (2.5 MG/3ML) 0.083% nebulizer solution Take 3 mLs (2.5 mg total) by nebulization every 6 (six) hours as needed for wheezing or shortness of breath.   albuterol (VENTOLIN HFA) 108 (90 Base) MCG/ACT inhaler INHALE 2 PUFFS INTO THE LUNGS EVERY 6 HOURS AS NEEDED FOR WHEEZING OR SHORTNESS OF BREATH   amLODipine-olmesartan (AZOR) 5-20 MG tablet Take 1 tablet by mouth daily.   azithromycin (ZITHROMAX) 250 MG tablet Take 2 tablets (500 mg total) by mouth daily for 1  day, THEN 1 tablet (250 mg total) daily for 4 days.   B-D UF III MINI PEN NEEDLES 31G X 5 MM MISC    baclofen (LIORESAL) 10 MG tablet TAKE 1 TABLET(10 MG) BY MOUTH TWICE DAILY AS NEEDED FOR MUSCLE SPASMS (Patient taking differently: Take 10 mg by mouth 2 (two) times daily as needed for muscle spasms.)   benzonatate (TESSALON) 100 MG capsule Take 1 capsule (100 mg total) by mouth 3 (three) times daily as needed for cough.   cefdinir (OMNICEF) 300 MG capsule Take 1 capsule (300 mg total) by mouth every 12 (twelve) hours for 10 days.   cetirizine (ZYRTEC) 10 MG tablet Take 10 mg by mouth daily.   Cholecalciferol (VITAMIN D) 50 MCG (2000 UT) CAPS Take 2,000 Units by mouth daily.   diclofenac Sodium (VOLTAREN) 1 % GEL Apply to knee once daily as needed (Patient taking differently: Apply 4 g topically daily.)   ezetimibe (ZETIA) 10 MG tablet Take 1 tablet (10 mg total) by mouth daily.   ferrous sulfate 325 (65 FE) MG EC tablet Take 1 tablet (325 mg total) by mouth every other day.   fesoterodine (TOVIAZ) 8 MG TB24 tablet TAKE 1 TABLET(8 MG) BY MOUTH DAILY (Patient taking differently: Take 8 mg by mouth daily.)   fluticasone (FLONASE) 50 MCG/ACT nasal spray Place 2 sprays into both nostrils in the morning and at bedtime.   furosemide (LASIX) 20 MG tablet Take  1 tablet (20 mg total) by mouth daily as needed for edema or fluid.   Insulin Pen Needle 31G X 5 MM MISC    naltrexone (DEPADE) 50 MG tablet Take 25 mg by mouth daily.   omeprazole (PRILOSEC) 40 MG capsule Take 1 capsule (40 mg total) by mouth in the morning and at bedtime. (Patient taking differently: Take 40 mg by mouth daily.)   ondansetron (ZOFRAN) 4 MG tablet Take 1 tablet (4 mg total) by mouth every 6 (six) hours as needed for nausea.   OXYGEN Inhale into the lungs.   Tiotropium Bromide-Olodaterol (STIOLTO RESPIMAT) 2.5-2.5 MCG/ACT AERS Inhale 2 puffs into the lungs daily.   triamcinolone cream (KENALOG) 0.1 % Apply 1 Application topically 2  (two) times daily as needed. APPLY TO THE AFFECTED AREA TWICE DAILY AS NEEDED FOR SKIN IRRITATION (Patient taking differently: Apply 1 Application topically 2 (two) times daily as needed (Skin irritation.).)   valACYclovir (VALTREX) 500 MG tablet TAKE 1 TABLET(500 MG) BY MOUTH TWICE DAILY FOR 3 DAYS. START OF OUTBREAK (Patient taking differently: Take 500 mg by mouth 2 (two) times daily as needed (Flare ups).)   XARELTO 20 MG TABS tablet TAKE 1 TABLET(20 MG) BY MOUTH EVERY MORNING (Patient taking differently: Take 20 mg by mouth in the morning.)   No facility-administered encounter medications on file as of 09/19/2022.    Allergies (verified) Penicillins   History: Past Medical History:  Diagnosis Date   Allergy    Anemia    Anxiety    Arthritis    Blood transfusion without reported diagnosis    Cough    Cough 06/11/2018   Difficulty sleeping 06/11/2018   Diverticulitis with perforation 2010   DVT (deep vein thrombosis) in pregnancy 11/24/2016   DVT, lower extremity (HCC)    Endometrial cancer (HCC)    Enlarged lymph nodes 08/13/2017   Family history of adverse reaction to anesthesia    pts daughter had difficulty awakening following anesthesia, long time to wake up   GERD (gastroesophageal reflux disease)    Headache    Hemoptysis 04/22/2018   History of bronchitis    History of ear infections    Hospital discharge follow-up 03/02/2019   Hx of migraines    Hyperlipidemia    Hypertension    IBS (irritable bowel syndrome)    Kidney infection    Lupus (systemic lupus erythematosus) (HCC) 02/2017   Morbid obesity (HCC)    Neck pain 01/13/2019   Ovarian cancer (HCC) dx'd 01/2015   PONV (postoperative nausea and vomiting)    Port-A-Cath in place    Right side   Portacath in place 09/12/2015   Pre-diabetes    Pyelonephritis    Right knee pain 11/28/2011   Scleroderma (HCC)    Screen for colon cancer 03/02/2019   Slow rate of speech 08/22/2015   Chronic from CVA.  She also has loss of  left nasolabial fold and slight left facial droop/small asymmetry on the left side secondary to CVA   UTI (urinary tract infection) 05/07/2018   Past Surgical History:  Procedure Laterality Date   ABDOMINAL HYSTERECTOMY N/A 03/13/2015   Procedure: TOTAL HYSTERECTOMY ABDOMINAL BILATERAL SALPINGO OOPHORECTOMY RADICAL TUMOR DEBUKING;  Surgeon: Adolphus Birchwood, MD;  Location: [facility];  Service: Gynecology;  Laterality: N/A;   BOWEL RESECTION  03/13/2015   Procedure: SMALL BOWEL RESECTION;  Surgeon: Adolphus Birchwood, MD;  Location: [facility];  Service: Gynecology;;   CESAREAN SECTION  1983 and 1984   COLONOSCOPY WITH PROPOFOL  N/A 07/19/2020   Procedure: COLONOSCOPY WITH PROPOFOL;  Surgeon: Sherrilyn Rist, MD;  Location: [facility];  Service: Gastroenterology;  Laterality: N/A;   COLOSTOMY  2008   COLOSTOMY TAKEDOWN     ESOPHAGOGASTRODUODENOSCOPY (EGD) WITH PROPOFOL N/A 12/21/2018   Procedure: ESOPHAGOGASTRODUODENOSCOPY (EGD) WITH PROPOFOL;  Surgeon: Sherrilyn Rist, MD;  Location: [facility];  Service: Gastroenterology;  Laterality: N/A;   HEMOSTASIS CLIP PLACEMENT  07/19/2020   Procedure: HEMOSTASIS CLIP PLACEMENT;  Surgeon: Sherrilyn Rist, MD;  Location: [facility];  Service: Gastroenterology;;   LAPAROTOMY N/A 03/13/2015   Procedure: EXPLORATORY LAPAROTOMY;  Surgeon: Adolphus Birchwood, MD;  Location: [facility];  Service: Gynecology;  Laterality: N/A;   POLYPECTOMY  07/19/2020   Procedure: POLYPECTOMY;  Surgeon: Sherrilyn Rist, MD;  Location: [facility];  Service: Gastroenterology;;   TONSILLECTOMY AND ADENOIDECTOMY  1997   VENTRAL HERNIA REPAIR  03/13/2015   Procedure: HERNIA REPAIR VENTRAL ADULT;  Surgeon: Adolphus Birchwood, MD;  Location: [facility];  Service: Gynecology;;   Family History  Problem Relation Age of Onset   Diabetes Mother    Hypertension Mother    Hyperlipidemia Mother    Colon polyps Mother        4 total   Fibroids Mother        s/p hysterectomy   Hyperlipidemia Father    Diabetes  Daughter    Hodgkin's lymphoma Daughter 36   Asthma Maternal Aunt        severe - d. 24   Prostate cancer Maternal Uncle 32   Diabetes Paternal Aunt    Diabetes Paternal Uncle    Kidney failure Maternal Grandmother 51   Pancreatic cancer Paternal Grandmother        dx. >50   Diabetes Paternal Grandfather    Diabetes Paternal Aunt    Heart disease Neg Hx    Stroke Neg Hx    Colon cancer Neg Hx    Stomach cancer Neg Hx    Esophageal cancer Neg Hx    Rectal cancer Neg Hx    Social History   Socioeconomic History   Marital status: Legally Separated    Spouse name: Not on file   Number of children: Not on file   Years of education: Not on file   Highest education level: Not on file  Occupational History   Not on file  Tobacco Use   Smoking status: Never   Smokeless tobacco: Never  Vaping Use   Vaping Use: Never used  Substance and Sexual Activity   Alcohol use: Yes    Comment: Maybe wine every 6 months   Drug use: No   Sexual activity: Yes    Partners: Male    Comment: Told she could not have more children in 1986  Other Topics Concern   Not on file  Social History Narrative   Works part time at [employer] (13 years as of 2015) - former Merchandiser, retail, 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   Married x 23 years, recently separated (4/15).  No domestic violence.  + financial stress.     Lives previously with husband who moved out, now with daughter who has medical problems, including Hodgkin's disease, and granddaughter (age 48).     Uses seat belt.    Social Determinants of Health   Financial Resource Strain: Low Risk  (09/19/2022)   Overall Financial Resource Strain (CARDIA)    Difficulty of Paying Living Expenses: Not hard at all  Food Insecurity: No Food Insecurity (09/19/2022)   Hunger Vital Sign    Worried About Running Out of Food in the Last Year: Never true    Ran Out of Food in the Last Year: Never true  Transportation Needs: No Transportation Needs  (09/19/2022)   PRAPARE - Administrator, Civil Service (Medical): No    Lack of Transportation (Non-Medical): No  Physical Activity: Inactive (09/19/2022)   Exercise Vital Sign    Days of Exercise per Week: 0 days    Minutes of Exercise per Session: 0 min  Stress: No Stress Concern Present (09/19/2022)   [employer] of Occupational Health - Occupational Stress Questionnaire    Feeling of Stress : Only a little  Social Connections: Moderately Isolated (09/19/2022)   Social Connection and Isolation Panel [NHANES]    Frequency of Communication with Friends and Family: More than three times a week    Frequency of Social Gatherings with Friends and Family: Three times a week    Attends Religious Services: More than 4 times per year    Active Member of Clubs or Organizations: No    Attends Banker Meetings: Never    Marital Status: Separated    Tobacco Counseling Counseling given: Not Answered   Clinical Intake:  Pre-visit preparation completed: Yes  Pain : No/denies pain  Diabetes: No  How often do you need to have someone help you when you read instructions, pamphlets, or other written materials from your doctor or pharmacy?: 1 - Never  Diabetic?No   Interpreter Needed?: No  Information entered by :: Kandis Fantasia LPN   Activities of Daily Living    09/19/2022   12:29 PM 06/25/2022    6:00 PM  In your present state of health, do you have any difficulty performing the following activities:  Hearing? 0 0  Vision? 0 0  Difficulty concentrating or making decisions? 0 0  Walking or climbing stairs? 1 0  Dressing or bathing? 0 0  Doing errands, shopping? 1 0  Comment ati times due to resp. prob   Preparing Food and eating ? N   Using the Toilet? N   In the past six months, have you accidently leaked urine? N   Do you have problems with loss of bowel control? N   Managing your Medications? N   Managing your Finances? N   Housekeeping or  managing your Housekeeping? N     Patient Care Team: Latrelle Dodrill, MD as PCP - General (Family Medicine) Parke Poisson, MD as PCP - Cardiology (Cardiology) Duke, Roe Rutherford, PA as Physician Assistant (Cardiology)  Indicate any recent Medical Services you may have received from other than [facility] providers in the past year (date may be approximate).     Assessment:   This is a routine wellness examination for [place].  Hearing/Vision screen Hearing Screening - Comments:: Denies hearing difficulties   Vision Screening - Comments:: No vision problems; will schedule routine eye exam soon    Dietary issues and exercise activities discussed: Current Exercise Habits: The patient does not participate in regular exercise at present, Exercise limited by: respiratory conditions(s)   Goals Addressed             This Visit's Progress    Remain active and independent        Depression Screen    09/19/2022   12:27 PM 09/17/2022    8:58 AM 09/16/2022    9:05 AM 08/21/2022    2:19  PM 07/22/2022   10:19 AM 07/02/2022   11:36 AM 06/11/2022   11:24 AM  PHQ 2/9 Scores  PHQ - 2 Score 0 0 0 0 0 0 0  PHQ- 9 Score 0 0 0 0 0 0 0    Fall Risk    09/19/2022   12:29 PM 06/11/2022   11:24 AM 01/09/2022   10:39 AM 07/19/2019   10:19 AM 06/09/2018    2:24 PM  Fall Risk   Falls in the past year? 0 0 0 0 0  Number falls in past yr: 0 0 0 0   Injury with Fall? 0 0 0 0   Risk for fall due to : No Fall Risks      Follow up Falls prevention discussed;Education provided;Falls evaluation completed        FALL RISK PREVENTION PERTAINING TO THE HOME:  Any stairs in or around the home? No  If so, are there any without handrails? No  Home free of loose throw rugs in walkways, pet beds, electrical cords, etc? Yes  Adequate lighting in your home to reduce risk of falls? Yes   ASSISTIVE DEVICES UTILIZED TO PREVENT FALLS:  Life alert? No  Use of a cane, walker or w/c? No  Grab bars in the  bathroom? Yes  Shower chair or bench in shower? No  Elevated toilet seat or a handicapped toilet? Yes   TIMED UP AND GO:  Was the test performed? No . Telephonic visit   Cognitive Function:        09/19/2022   12:30 PM  6CIT Screen  What Year? 0 points  What month? 0 points  What time? 0 points  Count back from 20 0 points  Months in reverse 0 points  Repeat phrase 0 points  Total Score 0 points    Immunizations Immunization History  Administered Date(s) Administered   Influenza Split 06/10/2011   Influenza,inj,Quad PF,6+ Mos 12/12/2014, 01/17/2016, 12/04/2016, 01/14/2018, 02/01/2019, 01/05/2020, 12/20/2020, 01/09/2022   Moderna Sars-Covid-2 Vaccination 06/24/2019, 07/25/2019   PFIZER(Purple Top)SARS-COV-2 Vaccination 03/15/2020   PNEUMOCOCCAL CONJUGATE-20 12/20/2020   PPD Test 02/02/2019   Td 07/11/2008   Tdap 05/30/2022    TDAP status: Up to date  Pneumococcal vaccine status: Up to date  Covid-19 vaccine status: Information provided on how to obtain vaccines.   Qualifies for Shingles Vaccine? Yes   Zostavax completed No   Shingrix Completed?: No.    Education has been provided regarding the importance of this vaccine. Patient has been advised to call insurance company to determine out of pocket expense if they have not yet received this vaccine. Advised may also receive vaccine at local pharmacy or Health Dept. Verbalized acceptance and understanding.  Screening Tests Health Maintenance  Topic Date Due   Zoster Vaccines- Shingrix (1 of 2) Never done   COVID-19 Vaccine (4 - 2023-24 season) 12/06/2021   INFLUENZA VACCINE  11/06/2022   Colonoscopy  07/20/2023   Medicare Annual Wellness (AWV)  09/19/2023   MAMMOGRAM  05/09/2024   DTaP/Tdap/Td (3 - Td or Tdap) 05/30/2032   Hepatitis C Screening  Completed   HIV Screening  Completed   HPV VACCINES  Aged Out   PAP SMEAR-Modifier  Discontinued    Health Maintenance  Health Maintenance Due  Topic Date Due    Zoster Vaccines- Shingrix (1 of 2) Never done   COVID-19 Vaccine (4 - 2023-24 season) 12/06/2021    Colorectal cancer screening: Type of screening: Colonoscopy. Completed 07/19/20. Repeat every 3 years  Mammogram status: Completed 05/09/22. Repeat every year  Lung Cancer Screening: (Low Dose CT Chest recommended if Age 5-80 years, 30 pack-year currently smoking OR have quit w/in 15years.) does not qualify.   Lung Cancer Screening Referral: n/a  Additional Screening:  Hepatitis C Screening: does qualify; Completed 03/15/20  Vision Screening: Recommended annual ophthalmology exams for early detection of glaucoma and other disorders of the eye. Is the patient up to date with their annual eye exam?  No  Who is the provider or what is the name of the office in which the patient attends annual eye exams? none If pt is not established with a provider, would they like to be referred to a provider to establish care? No .   Dental Screening: Recommended annual dental exams for proper oral hygiene  Community Resource Referral / Chronic Care Management: CRR required this visit?  No   CCM required this visit?  No      Plan:     I have personally reviewed and noted the following in the patient's chart:   Medical and social history Use of alcohol, tobacco or illicit drugs  Current medications and supplements including opioid prescriptions. Patient is not currently taking opioid prescriptions. Functional ability and status Nutritional status Physical activity Advanced directives List of other physicians Hospitalizations, surgeries, and ER visits in previous 12 months Vitals Screenings to include cognitive, depression, and falls Referrals and appointments  In addition, I have reviewed and discussed with patient certain preventive protocols, quality metrics, and best practice recommendations. A written personalized care plan for preventive services as well as general preventive health  recommendations were provided to patient.     Durwin Nora, [place]   1/61/0960   Due to this being a virtual visit, the after visit summary with patients personalized plan was offered to patient via mail or my-chart.  Patient would like to access on my-chart  Nurse Notes: No concern

## 2022-10-03 ENCOUNTER — Telehealth: Payer: Self-pay | Admitting: Pulmonary Disease

## 2022-10-03 NOTE — Telephone Encounter (Signed)
Yes I would still recommend port removal. Her oncology provider ordered this removed as well. So it seems her oncology team agrees.

## 2022-10-03 NOTE — Telephone Encounter (Signed)
PT is calling stating that she  had another aspiration incident about 3 weeks go but she did not go into the hospital.   She just finished her antibx on Monday.   Her records [facility], her PCP.  Dr. Rexene Edison wanted her to arrange to have her port removed but after this incident does he still want her to proceed with that recommendation?  Her # is [phone]  Routing to Triage and direct to Dr. Due to Triage shortage.

## 2022-10-08 NOTE — Telephone Encounter (Signed)
Pt. Called back and let her know what Dr. Judeth Horn recommended and nothing further

## 2022-10-08 NOTE — Telephone Encounter (Signed)
ATC x1 LVM for patient to call our office back regarding prior message.

## 2022-10-16 ENCOUNTER — Other Ambulatory Visit: Payer: Self-pay | Admitting: Gynecologic Oncology

## 2022-10-16 ENCOUNTER — Telehealth: Payer: Self-pay | Admitting: Surgery

## 2022-10-16 DIAGNOSIS — Z95828 Presence of other vascular implants and grafts: Secondary | ICD-10-CM

## 2022-10-16 NOTE — Telephone Encounter (Signed)
Patient called in stating she would like to get her port out. She was told by her pulmonologist that she needed to contact her oncologist to have this done. Patient saw Dr Jake Bathe as recently as 2017 but is not currently being treated. Patient states she has had multiple occurrences of aspiration pneumonia and has been putting off getting her port removed because she is afraid to get sick again. Advised patient that Warner Mccreedy, APP would be notified of her request and someone from our office will reach out to her with recommendations. Patient verbalized understanding and had no further concerns at this time.

## 2022-10-16 NOTE — Telephone Encounter (Signed)
Called patient to let her know that IR eval order has been placed so that she can meet with IR to discuss options and safety of getting her port removed. Advised patient that IR will call her to set up this appointment and that they will manage getting her port removed after her appointment with them. Patient thanked me for this information and had no other concerns at this time.

## 2022-10-20 ENCOUNTER — Encounter: Payer: Self-pay | Admitting: Family Medicine

## 2022-10-20 ENCOUNTER — Ambulatory Visit (INDEPENDENT_AMBULATORY_CARE_PROVIDER_SITE_OTHER): Payer: Medicare HMO | Admitting: Family Medicine

## 2022-10-20 ENCOUNTER — Other Ambulatory Visit: Payer: Self-pay

## 2022-10-20 VITALS — BP 125/71 | HR 96 | Ht 65.0 in | Wt 367.2 lb

## 2022-10-20 DIAGNOSIS — J189 Pneumonia, unspecified organism: Secondary | ICD-10-CM | POA: Diagnosis not present

## 2022-10-20 DIAGNOSIS — D649 Anemia, unspecified: Secondary | ICD-10-CM

## 2022-10-20 DIAGNOSIS — J9611 Chronic respiratory failure with hypoxia: Secondary | ICD-10-CM

## 2022-10-20 DIAGNOSIS — I10 Essential (primary) hypertension: Secondary | ICD-10-CM

## 2022-10-20 NOTE — Assessment & Plan Note (Addendum)
Tolerating ongoing treatment with xarelto for recurrent PE. Given patient's past history of low hemoglobin, will check hemoglobin, iron, TIBC and ferritin levels.

## 2022-10-20 NOTE — Patient Instructions (Addendum)
It was great to see you again today.  Will order portable oxygen concentrator for you  Checking blood counts and iron levels today  Follow up in 3-6 months, sooner if needed  Be well, Dr. Pollie Meyer

## 2022-10-20 NOTE — Progress Notes (Unsigned)
    SUBJECTIVE:   CHIEF COMPLAINT / HPI:   [name] is a 57 y/o female presenting today to discuss health maintenance and portable oxygen concentrator options.   Recurrent Pneumonia: Patient reports experiencing 3 episodes of aspiration pneumonia since March of 2024 caused by hiatal hernia. She reports being seen for weight loss surgery with an upcoming appointment in October. She reports this surgery would correct the hernia as well. She endorses an intentional 40+ weight loss and being encouraged to lose 38 more pounds prior to surgery. She endorses a lower carbohydrate diet, intermittent fasting and exercising. Patient endorses cough and some congestion. Patient denies fever or SOB at this time. Patient currently uses 2L O2 every night when sleeping and during the day as needed. She reports her current tank is hindering her daily activities and requests a more portable option.   Urinary Incontinence: Patient currently prescribed fesoterodine 8 mg daily. She reports her symptoms are better on this medication.  Hypertension: Currently taking amlodipine-olmesartan 5-20 mg daily.  Tolerating this well.  Patient also shared about the death of her father last 05/15/22 from cancer.  PERTINENT  PMH / PSH: HTN, CHF, OSA, GERD, Recurrent pneumonia   OBJECTIVE:   BP 125/71   Pulse 96   Ht 5\' 5"  (1.651 m)   Wt (!) 367 lb 3.2 oz (166.6 kg)   LMP 10/16/2013 (Approximate)   SpO2 99%   BMI 61.11 kg/m    General: NAD, pleasant, cooperative HEENT: Normocephalic, atraumatic Respiratory: Clear to auscultation bilaterally, normal work of breathing Cardiovascular: RRR, no murmur  ASSESSMENT/PLAN:   Recurrent pneumonia Stable. Continue current medication regimen. Given hindrance of daily activities with current O2 concentrator, portable O2 concentrator will be ordered.  Normocytic anemia Tolerating ongoing treatment with xarelto for recurrent PE. Given patient's past history of low  hemoglobin, will check hemoglobin, iron, TIBC and ferritin levels.  Morbid obesity (HCC) Working hard to lose weight so that she will qualify for weight loss surgery (and also fix hiatal hernia at the time of that surgery).  Continue ongoing efforts at weight loss.  We will prescribe portable oxygen concentrator so that she can be more mobile and hopefully lose more weight, enabling her to get the surgery.  Essential hypertension, benign Well-controlled, continue current regimen.   Health Maintenance Shingles vaccine discussed with patient. Patient interested in vaccine but deferred due to her recent pneumonia episodes.   FOLLOW UP: Patient to follow up in 3-6 months or earlier if needed.  Bubba Hales, Medical Student [facility]   Patient seen along with medical student Filomena Jungling. I personally evaluated this patient along with the student, and verified all aspects of the history, physical exam, and medical decision making as documented by the student. I agree with the student's documentation and have made all necessary edits.  Levert Feinstein, MD  [facility]

## 2022-10-20 NOTE — Assessment & Plan Note (Addendum)
Stable. Continue current medication regimen. Given hindrance of daily activities with current O2 concentrator, portable O2 concentrator will be ordered.

## 2022-10-21 LAB — CBC
Hematocrit: 29.9 % — ABNORMAL LOW (ref 34.0–46.6)
Hemoglobin: 9.4 g/dL — ABNORMAL LOW (ref 11.1–15.9)
MCH: 24.9 pg — ABNORMAL LOW (ref 26.6–33.0)
MCHC: 31.4 g/dL — ABNORMAL LOW (ref 31.5–35.7)
MCV: 79 fL (ref 79–97)
Platelets: 233 10*3/uL (ref 150–450)
RBC: 3.78 x10E6/uL (ref 3.77–5.28)
RDW: 16.4 % — ABNORMAL HIGH (ref 11.7–15.4)
WBC: 7.1 10*3/uL (ref 3.4–10.8)

## 2022-10-21 LAB — IRON,TIBC AND FERRITIN PANEL
Ferritin: 43 ng/mL (ref 15–150)
Iron Saturation: 8 % — CL (ref 15–55)
Iron: 30 ug/dL (ref 27–159)
Total Iron Binding Capacity: 388 ug/dL (ref 250–450)
UIBC: 358 ug/dL (ref 131–425)

## 2022-10-22 ENCOUNTER — Other Ambulatory Visit: Payer: Self-pay | Admitting: Family Medicine

## 2022-10-22 ENCOUNTER — Telehealth: Payer: Self-pay

## 2022-10-22 DIAGNOSIS — M545 Low back pain, unspecified: Secondary | ICD-10-CM

## 2022-10-22 NOTE — Telephone Encounter (Signed)
Patient calls nurse line requesting a handicap placard.   Will place form in PCP box.

## 2022-10-22 NOTE — Assessment & Plan Note (Signed)
Working hard to lose weight so that she will qualify for weight loss surgery (and also fix hiatal hernia at the time of that surgery).  Continue ongoing efforts at weight loss.  We will prescribe portable oxygen concentrator so that she can be more mobile and hopefully lose more weight, enabling her to get the surgery.

## 2022-10-22 NOTE — Assessment & Plan Note (Signed)
 Well-controlled, continue current regimen 

## 2022-10-23 NOTE — Telephone Encounter (Signed)
ATC x3 LVM for patient to call our office back regarding sleep study result's . Will send patient a letter.  Nothing else further needed.

## 2022-10-24 MED ORDER — BACLOFEN 10 MG PO TABS: ORAL_TABLET | ORAL | 0 refills | Status: DC

## 2022-10-28 ENCOUNTER — Other Ambulatory Visit: Payer: Self-pay | Admitting: Nurse Practitioner

## 2022-10-28 NOTE — Telephone Encounter (Signed)
Pt has been scheduled to see you on 01/13/23 at 10 am.

## 2022-10-28 NOTE — Telephone Encounter (Signed)
Please advise 

## 2022-10-31 ENCOUNTER — Telehealth: Payer: Self-pay

## 2022-10-31 DIAGNOSIS — M545 Low back pain, unspecified: Secondary | ICD-10-CM

## 2022-10-31 MED ORDER — BACLOFEN 10 MG PO TABS
10.0000 mg | ORAL_TABLET | Freq: Two times a day (BID) | ORAL | 0 refills | Status: DC | PRN
Start: 2022-10-31 — End: 2023-07-14

## 2022-10-31 NOTE — Telephone Encounter (Signed)
Called patient, reviewed labs Recommend starting iron every other day (she says she has not been taking this) Iron sat was low, Hgb stable at 9.4. On chronic xarelto for recurrent PE. Due for repeat colonoscopy in 2025 though advised it is reasonable for her to get in sooner with GI. Her iron/anemia labs are not markedly worse than they have been in the past but given ongoing IDA reasonable to rescope sooner (if GI agrees) given poor quality of prep last time.  She also requests refill of baclofen - sent in for her Also informed her handicap placard is available at front desk for her.  Patient appreciative.  Latrelle Dodrill, MD

## 2022-10-31 NOTE — Telephone Encounter (Signed)
Patient LVM on nurse line requesting recent lab results.   She reports she can not get into her mychart and requests a phone call.   Will forward to PCP.

## 2022-10-31 NOTE — Telephone Encounter (Signed)
See separate phone encounter

## 2022-11-01 ENCOUNTER — Other Ambulatory Visit: Payer: Self-pay | Admitting: Family Medicine

## 2022-11-11 ENCOUNTER — Ambulatory Visit (INDEPENDENT_AMBULATORY_CARE_PROVIDER_SITE_OTHER): Payer: Medicare HMO | Admitting: Student

## 2022-11-11 ENCOUNTER — Ambulatory Visit
Admission: RE | Admit: 2022-11-11 | Discharge: 2022-11-11 | Disposition: A | Discharge status: Home / Self Care | Payer: Medicare HMO | Source: Ambulatory Visit | Attending: Family Medicine | Admitting: Family Medicine

## 2022-11-11 ENCOUNTER — Other Ambulatory Visit: Payer: Self-pay

## 2022-11-11 VITALS — BP 160/80 | HR 78 | Ht 65.0 in | Wt 366.0 lb

## 2022-11-11 DIAGNOSIS — J189 Pneumonia, unspecified organism: Secondary | ICD-10-CM

## 2022-11-11 MED ORDER — CEFDINIR 300 MG PO CAPS: 300.0000 mg | ORAL_CAPSULE | Freq: Two times a day (BID) | ORAL | 0 refills | Status: DC

## 2022-11-11 MED ORDER — AZITHROMYCIN 250 MG PO TABS
ORAL_TABLET | ORAL | 0 refills | Status: DC
Start: 2022-11-11 — End: 2022-12-23

## 2022-11-11 NOTE — Patient Instructions (Addendum)
Ms. Askey,  It's a joy to meet you. So sorry you're sick again. Let's check an Xray and go ahead and treat for PNA.  Please go to [facility] at [employer] to get your X-Ray done. They are open 7:30a-5p Monday-Friday. You do not need an appointment to get this done.    I am prescribing two antibiotics: Azithromycin which you will take for five days and cefdinir which you will take for seven days.  I will let Dr. Judeth Horn know about this as well.  IF you get worse at all, increased work of breathing, large volumes of blood, or anything else that makes you worried, do not hesitate to come back and see [place] here or go to the ER.  Eliezer Mccoy, MD

## 2022-11-11 NOTE — Progress Notes (Unsigned)
    SUBJECTIVE:   CHIEF COMPLAINT / HPI:   Recurrent Pneumonia Here with increased cough and bloody sputum since about 3 AM yesterday (8/5).  No fever or shortness of breath at this time. No CP. Has a history of recurrent pneumonia thought to be secondary to ?aspiration secondary to hiatal hernia.  She is followed by Dr. Judeth Horn for this.  She is following with the bariatric program at [facility] and is planning for gastric bypass surgery in the near future with a Nissen fundoplication for her hiatal hernia which hopefully will provide definitive treatment.   PERTINENT  PMH / PSH: HFpEF, endometrial cancer, PE on rivaroxaban, OSA on nighttime 2L O2, scleroderma, CKD III, recurrent pneumonia felt to be secondary to aspiration due to hiatal hernia and GERD   OBJECTIVE:   BP (!) 160/80   Pulse 78   Ht 5\' 5"  (1.651 m)   Wt (!) 366 lb (166 kg)   LMP 10/16/2013 (Approximate)   SpO2 99%   BMI 60.91 kg/m   Gen: Tired-appearing but non-toxic and NAD Cardio: RRR, no m/r/g Pulm: Normal WOB on RA, frequent coughing, coarse throughout, diminished in RLL and crackles in LLL. Speaking in full sentences.   ASSESSMENT/PLAN:   Recurrent pneumonia It appears she has recurrent PNA given cough, bloody sputum, and abnormal lung exam today. Also considered the possibility of recurrent PE but she has been adherent to her Xarelto and has no CP, SOB, hypoxia, or tachycardia.  - Will treat with Azithromycin x5 days and cefdinir x7 days - Continued follow-up with Dr. Judeth Horn - Nissen fundoplication will hopefully stop this cycle of recurrence      J Dorothyann Gibbs, MD [facility]

## 2022-11-12 ENCOUNTER — Other Ambulatory Visit: Payer: Self-pay | Admitting: Family Medicine

## 2022-11-13 ENCOUNTER — Other Ambulatory Visit: Payer: Self-pay | Admitting: Family Medicine

## 2022-11-13 ENCOUNTER — Other Ambulatory Visit: Payer: Self-pay | Admitting: Nurse Practitioner

## 2022-11-13 NOTE — Assessment & Plan Note (Signed)
It appears she has recurrent PNA given cough, bloody sputum, and abnormal lung exam today. Also considered the possibility of recurrent PE but she has been adherent to her Xarelto and has no CP, SOB, hypoxia, or tachycardia.  - Will treat with Azithromycin x5 days and cefdinir x7 days - Continued follow-up with Dr. Judeth Horn - Nissen fundoplication will hopefully stop this cycle of recurrence

## 2022-11-19 ENCOUNTER — Inpatient Hospital Stay: Admission: RE | Admit: 2022-11-19 | Payer: Medicare HMO | Source: Ambulatory Visit

## 2022-11-21 ENCOUNTER — Ambulatory Visit
Admission: RE | Admit: 2022-11-21 | Discharge: 2022-11-21 | Disposition: A | Discharge status: Home / Self Care | Payer: Medicare HMO | Source: Ambulatory Visit | Attending: Gynecologic Oncology | Admitting: Gynecologic Oncology

## 2022-11-21 DIAGNOSIS — Z95828 Presence of other vascular implants and grafts: Secondary | ICD-10-CM

## 2022-11-21 NOTE — Progress Notes (Addendum)
Patient ID: [name], female   DOB: 12/05/1965, 57 y.o.   MRN: [id number]       Chief Complaint: Patient was seen in consultation today for Port removal at the request of Cross,Melissa D  Referring Physician(s): Cross,Melissa D  History of Present Illness: [name] is a 56 y.o. female Remote history of ovarian cancer status post right IJ port catheter placement 04/11/2015 by Dr. Grace Isaac.  The catheter has worked well without complication.  She is not using it currently.  She is contemplating hiatal hernia repair possible gastric bypass surgery.  She is having some reflux .  Past Medical History:  Diagnosis Date   Allergy    Anemia    Anxiety    Arthritis    Blood transfusion without reported diagnosis    Cough    Cough 06/11/2018   Difficulty sleeping 06/11/2018   Diverticulitis with perforation 2010   DVT (deep vein thrombosis) in pregnancy 11/24/2016   DVT, lower extremity (HCC)    Endometrial cancer (HCC)    Enlarged lymph nodes 08/13/2017   Family history of adverse reaction to anesthesia    pts daughter had difficulty awakening following anesthesia, long time to wake up   GERD (gastroesophageal reflux disease)    Headache    Hemoptysis 04/22/2018   History of bronchitis    History of ear infections    Hospital discharge follow-up 03/02/2019   Hx of migraines    Hyperlipidemia    Hypertension    IBS (irritable bowel syndrome)    Kidney infection    Lupus (systemic lupus erythematosus) (HCC) 02/2017   Morbid obesity (HCC)    Neck pain 01/13/2019   Ovarian cancer (HCC) dx'd 01/2015   PONV (postoperative nausea and vomiting)    Port-A-Cath in place    Right side   Portacath in place 09/12/2015   Pre-diabetes    Pyelonephritis    Right knee pain 11/28/2011   Scleroderma (HCC)    Screen for colon cancer 03/02/2019   Slow rate of speech 08/22/2015   Chronic from CVA.  She also has loss of left nasolabial fold and slight left facial droop/small asymmetry on the  left side secondary to CVA   UTI (urinary tract infection) 05/07/2018    Past Surgical History:  Procedure Laterality Date   ABDOMINAL HYSTERECTOMY N/A 03/13/2015   Procedure: TOTAL HYSTERECTOMY ABDOMINAL BILATERAL SALPINGO OOPHORECTOMY RADICAL TUMOR DEBUKING;  Surgeon: Adolphus Birchwood, MD;  Location: [facility];  Service: Gynecology;  Laterality: N/A;   BOWEL RESECTION  03/13/2015   Procedure: SMALL BOWEL RESECTION;  Surgeon: Adolphus Birchwood, MD;  Location: [facility];  Service: Gynecology;;   CESAREAN SECTION  1983 and 1984   COLONOSCOPY WITH PROPOFOL N/A 07/19/2020   Procedure: COLONOSCOPY WITH PROPOFOL;  Surgeon: Sherrilyn Rist, MD;  Location: [facility];  Service: Gastroenterology;  Laterality: N/A;   COLOSTOMY  2008   COLOSTOMY TAKEDOWN     ESOPHAGOGASTRODUODENOSCOPY (EGD) WITH PROPOFOL N/A 12/21/2018   Procedure: ESOPHAGOGASTRODUODENOSCOPY (EGD) WITH PROPOFOL;  Surgeon: Sherrilyn Rist, MD;  Location: [facility];  Service: Gastroenterology;  Laterality: N/A;   HEMOSTASIS CLIP PLACEMENT  07/19/2020   Procedure: HEMOSTASIS CLIP PLACEMENT;  Surgeon: Sherrilyn Rist, MD;  Location: [facility];  Service: Gastroenterology;;   LAPAROTOMY N/A 03/13/2015   Procedure: EXPLORATORY LAPAROTOMY;  Surgeon: Adolphus Birchwood, MD;  Location: [facility];  Service: Gynecology;  Laterality: N/A;   POLYPECTOMY  07/19/2020   Procedure: POLYPECTOMY;  Surgeon: Charlie Pitter  III, MD;  Location: [facility];  Service: Gastroenterology;;   TONSILLECTOMY AND ADENOIDECTOMY  1997   VENTRAL HERNIA REPAIR  03/13/2015   Procedure: HERNIA REPAIR VENTRAL ADULT;  Surgeon: Adolphus Birchwood, MD;  Location: [facility];  Service: Gynecology;;    Allergies: Penicillins  Medications: Prior to Admission medications   Medication Sig Start Date End Date Taking? Authorizing Provider  acetaminophen (TYLENOL) 500 MG tablet Take 2 tablets (1,000 mg total) by mouth every 6 (six) hours as needed for moderate pain. 03/14/21   Jerre Simon, MD  albuterol  (PROVENTIL) (2.5 MG/3ML) 0.083% nebulizer solution Take 3 mLs (2.5 mg total) by nebulization every 6 (six) hours as needed for wheezing or shortness of breath. 08/21/22   Celine Mans, MD  albuterol (VENTOLIN HFA) 108 (90 Base) MCG/ACT inhaler INHALE 2 PUFFS INTO THE LUNGS EVERY 6 HOURS AS NEEDED FOR WHEEZING OR SHORTNESS OF BREATH 10/24/22   Latrelle Dodrill, MD  amLODipine-olmesartan (AZOR) 5-20 MG tablet Take 1 tablet by mouth daily. 08/22/22   Latrelle Dodrill, MD  azithromycin (ZITHROMAX) 250 MG tablet Take two tablets (500mg  total) on day one followed by one tablet (250mg  total) daily for a total of five days. 11/11/22   Alicia Amel, MD  B-D UF III MINI PEN NEEDLES 31G X 5 MM MISC  07/28/19   [provider]  baclofen (LIORESAL) 10 MG tablet Take 1 tablet (10 mg total) by mouth 2 (two) times daily as needed for muscle spasms. 10/31/22   Latrelle Dodrill, MD  benzonatate (TESSALON) 100 MG capsule Take 1 capsule (100 mg total) by mouth 3 (three) times daily as needed for cough. 07/02/22   Sabino Dick, DO  cefdinir (OMNICEF) 300 MG capsule Take 1 capsule (300 mg total) by mouth 2 (two) times daily. 11/11/22   Alicia Amel, MD  cetirizine (ZYRTEC) 10 MG tablet Take 10 mg by mouth daily. 01/22/16   [provider]  Cholecalciferol (VITAMIN D) 50 MCG (2000 UT) CAPS Take 2,000 Units by mouth daily.    [provider]  diclofenac Sodium (VOLTAREN) 1 % GEL Apply to knee once daily as needed Patient taking differently: Apply 4 g topically daily. 05/26/22   Ganta, Anupa, DO  ezetimibe (ZETIA) 10 MG tablet Take 1 tablet (10 mg total) by mouth daily. 06/27/22   Alwyn Ren, MD  ferrous sulfate 325 (65 FE) MG EC tablet Take 1 tablet (325 mg total) by mouth every other day. 09/17/22   Littie Deeds, MD  fesoterodine (TOVIAZ) 8 MG TB24 tablet Take 1 tablet (8 mg total) by mouth daily. 11/12/22   Latrelle Dodrill, MD  fluticasone (FLONASE) 50 MCG/ACT nasal  spray Place 2 sprays into both nostrils in the morning and at bedtime. 07/22/22   Elberta Fortis, MD  furosemide (LASIX) 20 MG tablet Take 1 tablet (20 mg total) by mouth daily as needed for edema or fluid. 09/17/22   Littie Deeds, MD  naltrexone (DEPADE) 50 MG tablet Take 25 mg by mouth daily. 09/09/21   [provider]  omeprazole (PRILOSEC) 40 MG capsule TAKE 1 CAPSULE(40 MG) BY MOUTH IN THE MORNING AND AT BEDTIME 11/13/22   Kennedy-Smith, Malachi Carl, NP  ondansetron (ZOFRAN) 4 MG tablet Take 1 tablet (4 mg total) by mouth every 6 (six) hours as needed for nausea. 06/27/22   Alwyn Ren, MD  OXYGEN Inhale into the lungs.    [provider]  rivaroxaban (XARELTO) 20 MG TABS tablet Take 1  tablet (20 mg total) by mouth in the morning. 11/14/22   Latrelle Dodrill, MD  Tiotropium Bromide-Olodaterol (STIOLTO RESPIMAT) 2.5-2.5 MCG/ACT AERS Inhale 2 puffs into the lungs daily. 06/05/22   Hunsucker, Lesia Sago, MD  triamcinolone cream (KENALOG) 0.1 % Apply 1 Application topically 2 (two) times daily as needed. APPLY TO THE AFFECTED AREA TWICE DAILY AS NEEDED FOR SKIN IRRITATION Patient taking differently: Apply 1 Application topically 2 (two) times daily as needed (Skin irritation.). 01/09/22   Latrelle Dodrill, MD  valACYclovir (VALTREX) 500 MG tablet TAKE 1 TABLET(500 MG) BY MOUTH TWICE DAILY FOR 3 DAYS AT START OF OUTBREAK 11/05/22   Latrelle Dodrill, MD     Family History  Problem Relation Age of Onset   Diabetes Mother    Hypertension Mother    Hyperlipidemia Mother    Colon polyps Mother        4 total   Fibroids Mother        s/p hysterectomy   Hyperlipidemia Father    Diabetes Daughter    Hodgkin's lymphoma Daughter 91   Asthma Maternal Aunt        severe - d. 24   Prostate cancer Maternal Uncle 36   Diabetes Paternal Aunt    Diabetes Paternal Uncle    Kidney failure Maternal Grandmother 11   Pancreatic cancer Paternal Grandmother        dx. >50    Diabetes Paternal Grandfather    Diabetes Paternal Aunt    Heart disease Neg Hx    Stroke Neg Hx    Colon cancer Neg Hx    Stomach cancer Neg Hx    Esophageal cancer Neg Hx    Rectal cancer Neg Hx     Social History   Socioeconomic History   Marital status: Legally Separated    Spouse name: Not on file   Number of children: Not on file   Years of education: Not on file   Highest education level: Some college, no degree  Occupational History   Not on file  Tobacco Use   Smoking status: Never   Smokeless tobacco: Never  Vaping Use   Vaping status: Never Used  Substance and Sexual Activity   Alcohol use: Yes    Comment: Maybe wine every 6 months   Drug use: No   Sexual activity: Yes    Partners: Male    Comment: Told she could not have more children in 1986  Other Topics Concern   Not on file  Social History Narrative   Works part time at [employer] (13 years as of 2015) - former Merchandiser, retail, 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   Married x 23 years, recently separated (4/15).  No domestic violence.  + financial stress.     Lives previously with husband who moved out, now with daughter who has medical problems, including Hodgkin's disease, and granddaughter (age 51).     Uses seat belt.    Social Determinants of Health   Financial Resource Strain: Low Risk  (11/12/2022)   Received from [facility], [facility]   Overall Financial Resource Strain (CARDIA)    Difficulty of Paying Living Expenses: Not very hard  Food Insecurity: No Food Insecurity (11/12/2022)   Received from [facility], [facility]   Hunger Vital Sign    Worried About Running Out of Food in the Last Year: Never true    Ran Out of Food in the Last Year: Never true  Transportation Needs: No Transportation Needs (11/12/2022)   Received from [facility], [facility]   [facility] - Transportation    Lack of Transportation (Medical): No    Lack of Transportation (Non-Medical): No  Physical  Activity: Insufficiently Active (11/12/2022)   Received [facility], [facility]   Exercise Vital Sign    Days of Exercise per Week: 3 days    Minutes of Exercise per Session: 30 min  Stress: No Stress Concern Present (11/12/2022)   Received from [facility], [facility] - Occupational Stress Questionnaire    Feeling of Stress : Only a little  Social Connections: Socially Integrated (11/12/2022)   Received from [facility], [facility]   Social Network    How would you rate your social network (family, work, friends)?: Good participation with social networks  Recent Concern: Social Connections - Moderately Isolated (09/19/2022)   Social Connection and Isolation Panel [NHANES]    Frequency of Communication with Friends and Family: More than three times a week    Frequency of Social Gatherings with Friends and Family: Three times a week    Attends Religious Services: More than 4 times per year    Active Member of Clubs or Organizations: No    Attends Banker Meetings: Never    Marital Status: Separated    ECOG Status: 1 - Symptomatic but completely ambulatory  Review of Systems: A 12 point ROS discussed and pertinent positives are indicated in the HPI above.  All other systems are negative.  Review of Systems  Vital Signs: BP (!) 165/88 (BP Location: Left Wrist, Patient Position: Sitting, Cuff Size: Normal)   Pulse 72   Temp 97.8 F (36.6 C) (Oral)   Resp 14   LMP 10/16/2013 (Approximate)   SpO2 97%     Physical Exam Constitutional: Oriented to person, place, and time. Well-developed and well-nourished. No distress.   HENT:  Head: Normocephalic and atraumatic.  Eyes: Conjunctivae and EOM are normal. Right eye exhibits no discharge. Left eye exhibits no discharge. No scleral icterus.  Neck: No JVD present.  Pulmonary/Chest: Effort normal. No stridor. No respiratory distress. Right IJ port catheter chest  site is well-healed, healthy appearing.  No fluctuance, redness, or tenderness. Abdomen: soft, Obese,non distended Neurological:  alert and oriented to person, place, and time.  Skin: Skin is warm and dry.  not diaphoretic.  Psychiatric:   normal mood and affect.   behavior is normal. Judgment and thought content normal.         Imaging: DG Chest 2 View  Result Date: 11/16/2022 CLINICAL DATA:  Cough, fever, hemoptysis EXAM: CHEST - 2 VIEW COMPARISON:  09/16/2022 FINDINGS: Frontal and lateral views of the chest demonstrate a stable right chest wall port. The cardiac silhouette is stable. There is patchy airspace disease throughout the left lung, greatest in the left perihilar and infrahilar regions. The right-sided airspace disease seen previously has resolved in the interim. No effusion or pneumothorax. No acute bony abnormalities. IMPRESSION: 1. Persistent or recurrent left-sided multifocal airspace disease as above, which could be related to infection, inflammation, or hemorrhage. 2. Resolution of the right-sided airspace disease seen previously. Electronically Signed   By: Sharlet Salina M.D.   On: 11/16/2022 12:24    Labs:  CBC: Recent Labs    06/28/22 0252 07/02/22 1452 09/16/22 0941 10/20/22 1151  WBC 8.7 6.7 10.7 7.1  HGB 8.3* 10.3* 9.4* 9.4*  HCT 27.1* 33.8* 29.7* 29.9*  PLT  216 319 233 233    COAGS: Recent Labs    06/25/22 0452  INR 1.4*    BMP: Recent Labs    06/25/22 0452 06/26/22 0354 06/28/22 0252 07/02/22 1452 09/16/22 0941  NA 139 138 138 142 142  K 3.8 3.7 3.6 4.3 4.2  CL 106 106 105 102 106  CO2 22 25 26 25 22   GLUCOSE 147* 99 104* 131* 103*  BUN 25* 20 18 16 21   CALCIUM 9.6 8.7* 8.7* 9.9 9.4  CREATININE 1.00 1.06* 1.14* 1.11* 1.07*  GFRNONAA >60 >60 56*  --   --     LIVER FUNCTION TESTS: Recent Labs    06/26/22 0354 06/28/22 0252  BILITOT 0.6 0.6  AST 13* 15  ALT 9 10  ALKPHOS 65 69  PROT 6.1* 6.3*  ALBUMIN 3.2* 3.1*    TUMOR  MARKERS: No results for input(s): "AFPTM", "CEA", "CA199", "CHROMGRNA" in the last 8760 hours.  Assessment and Plan:  My impression is this patient with a remote history of ovarian carcinoma no longer needs her well-functioning right IJ port catheter.  Removal is appropriate, if she has no anticipated long-term IV access needs.  We discussed the removal technique.  We discussed this is frequently done under local anesthesia alone without requiring any conscious sedation.  This alleviated the concerns of reflux complications.  We discussed the removal procedure and postprocedural care.  She is motivated to proceed.  Accordingly, we can set her up as an outpatient at [facility] for port catheter removal, without sedation.  Thank you for this interesting consult.  I greatly enjoyed meeting [name] and look forward to participating in their care.  A copy of this report was sent to the requesting provider on this date.  Electronically Signed: Durwin Glaze 11/21/2022, 12:56 PM   I spent a total of    25 Minutes in face to face in clinical consultation, greater than 50% of which was counseling/coordinating care for Right IJ port catheter removal.

## 2022-12-17 ENCOUNTER — Ambulatory Visit (HOSPITAL_COMMUNITY)
Admission: RE | Admit: 2022-12-17 | Discharge: 2022-12-17 | Disposition: A | Discharge status: Home / Self Care | Payer: Medicare HMO | Source: Ambulatory Visit | Attending: Gynecologic Oncology | Admitting: Gynecologic Oncology

## 2022-12-17 DIAGNOSIS — C561 Malignant neoplasm of right ovary: Secondary | ICD-10-CM | POA: Diagnosis not present

## 2022-12-17 DIAGNOSIS — C541 Malignant neoplasm of endometrium: Secondary | ICD-10-CM | POA: Insufficient documentation

## 2022-12-17 DIAGNOSIS — Z95828 Presence of other vascular implants and grafts: Secondary | ICD-10-CM

## 2022-12-17 DIAGNOSIS — C569 Malignant neoplasm of unspecified ovary: Secondary | ICD-10-CM | POA: Diagnosis not present

## 2022-12-17 DIAGNOSIS — Z452 Encounter for adjustment and management of vascular access device: Secondary | ICD-10-CM | POA: Diagnosis not present

## 2022-12-17 HISTORY — PX: IR REMOVAL TUN ACCESS W/ PORT W/O FL MOD SED: IMG2290

## 2022-12-17 MED ORDER — LIDOCAINE-EPINEPHRINE 1 %-1:100000 IJ SOLN
INTRAMUSCULAR | Status: AC
  Filled 2022-12-17: qty 1

## 2022-12-17 MED ORDER — LIDOCAINE-EPINEPHRINE 1 %-1:100000 IJ SOLN
20.0000 mL | Freq: Once | INTRAMUSCULAR | Status: AC
  Administered 2022-12-17: 10 mL via INTRADERMAL

## 2022-12-17 NOTE — Procedures (Signed)
  Procedure:  R chest port removal   Preprocedure diagnosis: Diagnoses of Ovarian carcinosarcoma, right (HCC), Endometrial ca (HCC), and Port-A-Cath in place were pertinent to this visit. Postprocedure diagnosis: same EBL:    minimal Complications:   none immediate  See full dictation in [employer].  Thora Lance MD Main # [phone] Pager  [phone] Mobile [phone]

## 2022-12-23 ENCOUNTER — Ambulatory Visit
Admission: RE | Admit: 2022-12-23 | Discharge: 2022-12-23 | Disposition: A | Discharge status: Home / Self Care | Payer: Medicare HMO | Source: Ambulatory Visit | Attending: Family Medicine | Admitting: Family Medicine

## 2022-12-23 ENCOUNTER — Ambulatory Visit (INDEPENDENT_AMBULATORY_CARE_PROVIDER_SITE_OTHER): Payer: Medicare HMO | Admitting: Family Medicine

## 2022-12-23 VITALS — BP 130/83 | HR 79 | Temp 98.2°F

## 2022-12-23 DIAGNOSIS — R0600 Dyspnea, unspecified: Secondary | ICD-10-CM | POA: Diagnosis not present

## 2022-12-23 DIAGNOSIS — R0602 Shortness of breath: Secondary | ICD-10-CM

## 2022-12-23 MED ORDER — CEFDINIR 300 MG PO CAPS
300.0000 mg | ORAL_CAPSULE | Freq: Two times a day (BID) | ORAL | 0 refills | Status: AC
Start: 2022-12-23 — End: 2022-12-30

## 2022-12-23 MED ORDER — AZITHROMYCIN 500 MG PO TABS
500.0000 mg | ORAL_TABLET | Freq: Once | ORAL | 0 refills | Status: AC
Start: 2022-12-23 — End: 2022-12-23

## 2022-12-23 MED ORDER — AZITHROMYCIN 250 MG PO TABS
ORAL_TABLET | ORAL | 0 refills | Status: DC
Start: 2022-12-23 — End: 2023-01-13

## 2022-12-23 NOTE — Progress Notes (Signed)
    SUBJECTIVE:   CHIEF COMPLAINT / HPI:   Recurrent pneumonia Hx of recurrent pneumonia thought due to be due to aspiration due to hiatal hernia. Coughed up some blood sputum on Saturday and Monday. No fevers. Had some increased difficulty breathing, but albuterol helped. On oxygen as needed at home.Taking Stiolto-Respimat daily. Takes Xarelto. No calf swelling. No runny nose or headaches. No diarrhea or vomiting. Wheezes when she lays down at night, over past several days.  PERTINENT  PMH / PSH: Recurrent pneumonia.   OBJECTIVE:   BP 130/83   Pulse 79   Temp 98.2 F (36.8 C) (Oral)   LMP 10/16/2013 (Approximate)   SpO2 100%   General: NAD  Neuro: A&O Cardiovascular: RRR, no murmurs, no JVD Respiratory: normal WOB on RA, diminished breath sounds at the right lower lung base, no wheezes, ronchi or rales Abdomen: soft, NTTP, no rebound or guarding Extremities: Moving all 4 extremities equally, 1+ peripheral edema bilaterally   ASSESSMENT/PLAN:   Assessment & Plan Shortness of breath Patient presenting with repeat symptoms of bloody sputum and increased dyspnea from baseline consistent with prior episodes of recurrent pneumonia.  Overall exam today is nonspecific for repeat pneumonia however given risk factors and history I believe it is warranted to treat her empirically today.  This is likely the cause of her dyspnea; however, given her peripheral edema and orthopnea will check a BNP today to rule out heart failure exacerbation.  She is reassuringly stable and saturating well on room air today.  Discussed strict return precautions with patient, recommended following up with her pulmonologist. Plan: -Chest x-ray ordered. -Azithromycin 5-day course -Cefdinir 7-day course -Follow-up with pulmonology -BNP Return if symptoms worsen or fail to improve.  Celine Mans, MD [facility]

## 2022-12-23 NOTE — Patient Instructions (Signed)
It was great to see you! Thank you for allowing me to participate in your care!  Our plans for today:  -I sent to antibiotics to pharmacy azithromycin, and cefdinir.  You will take one 500 mg tablet azithromycin today.  Then take azithromycin 250 mg twice daily for the next 4 days.  You will take the cefdinir 300 mg twice per day for the next 7 days. -I have ordered a chest x-ray which you can go have done at [facility]. -Please make a follow-up appointment with your lung doctor.   Please arrive 15 minutes PRIOR to your next scheduled appointment time! If you do not, this affects OTHER patients' care.  Take care and seek immediate care sooner if you develop any concerns.   Celine Mans, MD, PGY-2 [facility] 11:42 AM 12/23/2022  [facility]

## 2022-12-25 LAB — BRAIN NATRIURETIC PEPTIDE: BNP: 49.7 pg/mL (ref 0.0–100.0)

## 2022-12-27 ENCOUNTER — Other Ambulatory Visit: Payer: Self-pay | Admitting: Family Medicine

## 2023-01-13 ENCOUNTER — Ambulatory Visit (INDEPENDENT_AMBULATORY_CARE_PROVIDER_SITE_OTHER): Payer: Medicare HMO | Admitting: Nurse Practitioner

## 2023-01-13 ENCOUNTER — Other Ambulatory Visit (INDEPENDENT_AMBULATORY_CARE_PROVIDER_SITE_OTHER): Payer: Medicare HMO

## 2023-01-13 ENCOUNTER — Encounter: Payer: Self-pay | Admitting: Nurse Practitioner

## 2023-01-13 VITALS — BP 130/68 | HR 70 | Ht 65.0 in | Wt 377.0 lb

## 2023-01-13 DIAGNOSIS — D509 Iron deficiency anemia, unspecified: Secondary | ICD-10-CM

## 2023-01-13 DIAGNOSIS — K219 Gastro-esophageal reflux disease without esophagitis: Secondary | ICD-10-CM

## 2023-01-13 LAB — IBC + FERRITIN
Ferritin: 18.2 ng/mL (ref 10.0–291.0)
Iron: 39 ug/dL — ABNORMAL LOW (ref 42–145)
Saturation Ratios: 8.7 % — ABNORMAL LOW (ref 20.0–50.0)
TIBC: 448 ug/dL (ref 250.0–450.0)
Transferrin: 320 mg/dL (ref 212.0–360.0)

## 2023-01-13 LAB — COMPREHENSIVE METABOLIC PANEL
ALT: 7 U/L (ref 0–35)
AST: 12 U/L (ref 0–37)
Albumin: 3.9 g/dL (ref 3.5–5.2)
Alkaline Phosphatase: 93 U/L (ref 39–117)
BUN: 20 mg/dL (ref 6–23)
CO2: 28 meq/L (ref 19–32)
Calcium: 9.6 mg/dL (ref 8.4–10.5)
Chloride: 105 meq/L (ref 96–112)
Creatinine, Ser: 1.11 mg/dL (ref 0.40–1.20)
GFR: 54.94 mL/min — ABNORMAL LOW (ref >60.00)
Glucose, Bld: 91 mg/dL (ref 70–99)
Potassium: 4.4 meq/L (ref 3.5–5.1)
Sodium: 141 meq/L (ref 135–145)
Total Bilirubin: 0.3 mg/dL (ref 0.2–1.2)
Total Protein: 6.8 g/dL (ref 6.0–8.3)

## 2023-01-13 LAB — CBC
HCT: 32 % — ABNORMAL LOW (ref 36.0–46.0)
Hemoglobin: 10 g/dL — ABNORMAL LOW (ref 12.0–15.0)
MCHC: 31.1 g/dL (ref 30.0–36.0)
MCV: 77.3 fL — ABNORMAL LOW (ref 78.0–100.0)
Platelets: 249 10*3/uL (ref 150.0–400.0)
RBC: 4.14 Mil/uL (ref 3.87–5.11)
RDW: 16.5 % — ABNORMAL HIGH (ref 11.5–15.5)
WBC: 6.3 10*3/uL (ref 4.0–10.5)

## 2023-01-13 LAB — VITAMIN B12: Vitamin B-12: 260 pg/mL (ref 211–911)

## 2023-01-13 NOTE — Patient Instructions (Addendum)
Your provider has requested that you go to the basement level for lab work before leaving today. Press "B" on the elevator. The lab is located at the first door on the left as you exit the elevator.  Continue Pantoprazole 40 mg twice daily.  Colonoscopy due April 2025.  Follow up with your bariatric surgeon.  Due to recent changes in healthcare laws, you may see the results of your imaging and laboratory studies on MyChart before your provider has had a chance to review them.  We understand that in some cases there may be results that are confusing or concerning to you. Not all laboratory results come back in the same time frame and the provider may be waiting for multiple results in order to interpret others.  Please give [place] 48 hours in order for your provider to thoroughly review all the results before contacting the office for clarification of your results.   Thank you for trusting me with your gastrointestinal care!   Alcide Evener, CRNP

## 2023-01-13 NOTE — Progress Notes (Signed)
01/13/2023 [name] [id number] 1965/08/14   Chief Complaint: Medication refill, GERD follow up  History of Present Illness: [name] is a 57 year old female with a past medical history of obesity, anxiety, arthritis, hypertension, chronic diastolic CHF LV EF 60 - 65% with grade I diastolic dysfunction, CVA, DVT/PE on Xarelto, DM II, chronic anemia, CKD, endometrial cancer s/p total abdominal hysterectomy, BSO, cryo reduction and chemotherapy in 2017, diverticulitis with perforation which required a temporary colostomy 2008, Lupus, hiatal hernia, GERD and IBS. She endorses having recurrent multifocal pneumonia (likely aspiration pneumonia) which required ED evaluation/hospital admission 11/2021 and 06/2022 and treated as an outpatient 11/2022.  I last saw [name] in office 12/19/2020 for further evaluation regarding GERD symptoms and a large hiatal hernia.  At that time, Omeprazole was increased to 40 mg twice daily and a future hiatal hernia surgery time of bariatric surgery recommended.  EGD 12/21/2018 showed a 5 cm hiatal hernia otherwise was normal.  She presents today for further GI follow-up and to refill her Omeprazole prescription.  She is undergoing evaluation for gastric bypass surgery per bariatric surgeon at [facility] who prescribed [facility] for weight loss with a goal BMI of 50 prior to being considered a candidate for gastric surgery. However, her insurance did not cover the cost of Wegovy. She wishes to proceed with hiatal hernia repair and gastric bypass surgery soon as possible as there is concern that her active reflux and large hiatal hernia are contributing to episodes of recurrent aspiration pneumonia.  She is followed by a nutritionist at [facility].  She is drinking a protein shake in the morning and following a low-carb diet.  She was previously participating in aquatic exercises but has not resumed regular exercise since her last hospital admission  12/2022.  She has gained 10 pounds over the past 3 months. Currently, she denies having any heartburn or dysphagia.  No upper or lower abdominal pain.  She is passing normal formed brown bowel movement daily.  No rectal bleeding or black stools.  Colonoscopy 07/19/2020 identified a 2mm benign polyp at the splenic flexure and a  6mm tubular adenomatous polyp in the mid rectum which were removed. A repeat a colonoscopy in 3 year was recommended due to a fair bowel prep.       Latest Ref Rng & Units 10/20/2022   11:51 AM 09/16/2022    9:41 AM 07/02/2022    2:52 PM  CBC  WBC 3.4 - 10.8 x10E3/uL 7.1  10.7  6.7   Hemoglobin 11.1 - 15.9 g/dL 9.4  9.4  95.6   Hematocrit 34.0 - 46.6 % 29.9  29.7  33.8   Platelets 150 - 450 x10E3/uL 233  233  319        Latest Ref Rng & Units 09/16/2022    9:41 AM 07/02/2022    2:52 PM 06/28/2022    2:52 AM  CMP  Glucose 70 - 99 mg/dL 213  086  578   BUN 6 - 24 mg/dL 21  16  18    Creatinine 0.57 - 1.00 mg/dL 4.69  6.29  5.28   Sodium 134 - 144 mmol/L 142  142  138   Potassium 3.5 - 5.2 mmol/L 4.2  4.3  3.6   Chloride 96 - 106 mmol/L 106  102  105   CO2 20 - 29 mmol/L 22  25  26    Calcium 8.7 - 10.2 mg/dL 9.4  9.9  8.7  Total Protein 6.5 - 8.1 g/dL   6.3   Total Bilirubin 0.3 - 1.2 mg/dL   0.6   Alkaline Phos 38 - 126 U/L   69   AST 15 - 41 U/L   15   ALT 0 - 44 U/L   10   Hemoglobin A1c 6.0, Ferritin 37, folate 5.1 and vitamin B12 level 243 on 05/22/2022  PAST GI PROCEDURES:  EGD 12/21/2018: showed a 5cm hiatal hernia otherwise was normal.    Colonoscopy by Dr. Myrtie Neither on 07/19/2020: identified a 2mm benign polyp at the splenic flexure and a  6mm tubular adenomatous polyp in the mid rectum which were removed. A repeat a colonoscopy in 3 year was recommended due to a fair bowel prep  Current Medications, Allergies, Past Medical History, Past Surgical History, Family History and Social History were reviewed in [employer] record.   Review  of Systems:   Constitutional: Negative for fever, sweats, chills or weight loss.  Respiratory: Negative for shortness of breath.   Cardiovascular: Negative for chest pain, palpitations and leg swelling.  Gastrointestinal: See HPI.  Musculoskeletal: Negative for back pain or muscle aches.  Neurological: Negative for dizziness, headaches or paresthesias.    Physical Exam: Ht 5\' 5"  (1.651 m)   Wt (!) 377 lb (171 kg)   LMP 10/16/2013 (Approximate)   BMI 62.74 kg/m   Wt Readings from Last 3 Encounters:  01/13/23 (!) 377 lb (171 kg)  11/11/22 (!) 366 lb (166 kg)  10/20/22 (!) 367 lb 3.2 oz (166.6 kg)    General: 57 year old obese female in no acute distress. Head: Normocephalic and atraumatic. Eyes: No scleral icterus. Conjunctiva pink . Ears: Normal auditory acuity. Mouth: Dentition intact. No ulcers or lesions.  Lungs: Clear throughout to auscultation. Heart: Regular rate and rhythm, no murmur. Abdomen: Soft, nontender and nondistended. No masses or hepatomegaly. Normal bowel sounds x 4 quadrants.  Rectal: Deferred.  Musculoskeletal: Symmetrical with no gross deformities. Extremities: No edema. Neurological: Alert oriented x 4. No focal deficits.  Psychological: Alert and cooperative. Normal mood and affect  Assessment and Recommendations:  GERD.  EGD 12/21/2018 identified a 5 cm hiatal hernia otherwise was normal. -Continue Omeprazole 40 mg twice daily to be taken 30 minutes before breakfast and dinner -Proceed with bariatric evaluation and follow-up with nutritionist at [facility] -Patient to contact her bariatric surgeon to facilitate appeal regarding decision for Wegovy  Chronic anemia, multifactorial: CKD, past endometrial cancer and hiatal hernia. No overt GI bleeding. CBC, IBC + ferritin and B12 level  Colon polyps. Colonoscopy 07/20/2010 identified a 2mm tubular adenomatous polyp removed from the splenic flexure and a 6mm tubular adenomatous polyp removed from the  mid rectum. -Next colonoscopy with a 2-day bowel prep due 07/2023  Recurrent aspiration pneumonia 11/2021, 06/2022 and 11/2022 secondary to a large hiatal hernia, acid reflux and obesity.  -Patient to proceed with follow-up with her bariatric surgeon at [facility]. Unfortunately, she remains at high risk for recurrent aspiration pneumonia and hiatal hernia repair will hopefully prevent recurrence  DM type II  History of CVA, DVT/PE on Xarelto

## 2023-01-16 ENCOUNTER — Telehealth: Payer: Self-pay | Admitting: Nurse Practitioner

## 2023-01-16 NOTE — Telephone Encounter (Signed)
Returned patient call & advised her that once [place], NP has reviewed results then we will be in touch with her regarding recommendations. She asked that she receive a call d/t not being able to access mychart recently.

## 2023-01-16 NOTE — Telephone Encounter (Signed)
PT is calling to speak about lab results. Please advise.

## 2023-01-18 NOTE — Progress Notes (Signed)
____________________________________________________________  Attending physician addendum:  Thank you for sending this case to me. I have reviewed the entire note and agree with the plan.  I suspect she has ongoing occult GI blood loss from hiatal hernia related erosions, and I do not think she needs a repeat upper endoscopy at this point. If oral iron does not sufficiently normalize her levels, then IV iron is warranted.  Amada Jupiter, MD  ____________________________________________________________

## 2023-01-19 ENCOUNTER — Telehealth: Payer: Self-pay

## 2023-01-19 ENCOUNTER — Other Ambulatory Visit: Payer: Self-pay

## 2023-01-19 DIAGNOSIS — R7989 Other specified abnormal findings of blood chemistry: Secondary | ICD-10-CM

## 2023-01-19 DIAGNOSIS — D509 Iron deficiency anemia, unspecified: Secondary | ICD-10-CM

## 2023-01-19 MED ORDER — FERROUS SULFATE 325 (65 FE) MG PO TBEC
325.0000 mg | DELAYED_RELEASE_TABLET | Freq: Every day | ORAL | 2 refills | Status: DC
Start: 2023-01-19 — End: 2024-01-04

## 2023-01-19 NOTE — Telephone Encounter (Signed)
Patient calls nurse line regarding concerns with low Vitamin B12 levels.   She had her labs drawn at her GI specialist and they advised her to reach out to PCP regarding B12 levels. B12 on 01/13/23 was 260.  Will forward to PCP.   Veronda Prude, RN

## 2023-01-22 DIAGNOSIS — C569 Malignant neoplasm of unspecified ovary: Secondary | ICD-10-CM | POA: Diagnosis not present

## 2023-01-22 DIAGNOSIS — I10 Essential (primary) hypertension: Secondary | ICD-10-CM | POA: Diagnosis not present

## 2023-01-22 DIAGNOSIS — N183 Chronic kidney disease, stage 3 unspecified: Secondary | ICD-10-CM | POA: Diagnosis not present

## 2023-01-22 DIAGNOSIS — K219 Gastro-esophageal reflux disease without esophagitis: Secondary | ICD-10-CM | POA: Diagnosis not present

## 2023-01-22 DIAGNOSIS — I5032 Chronic diastolic (congestive) heart failure: Secondary | ICD-10-CM | POA: Diagnosis not present

## 2023-01-22 DIAGNOSIS — R7303 Prediabetes: Secondary | ICD-10-CM | POA: Diagnosis not present

## 2023-01-22 NOTE — Telephone Encounter (Signed)
Patient returns call to nurse line. She is very concerned as she thought that levels were "extremely low."  Advised that B12 levels are in the low-normal range per note from GI doctor.   Will forward to PCP.   Veronda Prude, RN

## 2023-01-26 NOTE — Telephone Encounter (Signed)
I am not able to call her this AM, will try this PM  Since b12 level was borderline low next step is to check folate, homocysteine, and MMA. Ideally she would have a visit with me to discuss this in more detail.  Latrelle Dodrill, MD

## 2023-01-28 NOTE — Telephone Encounter (Signed)
Patient returns call to nurse line. Scheduled with PCP on 02/05/23.  Veronda Prude, RN

## 2023-02-03 DIAGNOSIS — K219 Gastro-esophageal reflux disease without esophagitis: Secondary | ICD-10-CM | POA: Diagnosis not present

## 2023-02-03 DIAGNOSIS — N183 Chronic kidney disease, stage 3 unspecified: Secondary | ICD-10-CM | POA: Diagnosis not present

## 2023-02-03 DIAGNOSIS — E66813 Obesity, class 3: Secondary | ICD-10-CM | POA: Diagnosis not present

## 2023-02-03 DIAGNOSIS — I5032 Chronic diastolic (congestive) heart failure: Secondary | ICD-10-CM | POA: Diagnosis not present

## 2023-02-03 DIAGNOSIS — Z6841 Body Mass Index (BMI) 40.0 and over, adult: Secondary | ICD-10-CM | POA: Diagnosis not present

## 2023-02-03 DIAGNOSIS — Z8701 Personal history of pneumonia (recurrent): Secondary | ICD-10-CM | POA: Diagnosis not present

## 2023-02-03 DIAGNOSIS — K449 Diaphragmatic hernia without obstruction or gangrene: Secondary | ICD-10-CM | POA: Diagnosis not present

## 2023-02-03 DIAGNOSIS — I10 Essential (primary) hypertension: Secondary | ICD-10-CM | POA: Diagnosis not present

## 2023-02-05 ENCOUNTER — Encounter: Payer: Self-pay | Admitting: Family Medicine

## 2023-02-05 ENCOUNTER — Other Ambulatory Visit: Payer: Self-pay

## 2023-02-05 ENCOUNTER — Ambulatory Visit: Payer: Medicare HMO | Admitting: Family Medicine

## 2023-02-05 VITALS — BP 127/76 | HR 81 | Ht 65.0 in | Wt 366.2 lb

## 2023-02-05 DIAGNOSIS — Z0000 Encounter for general adult medical examination without abnormal findings: Secondary | ICD-10-CM | POA: Diagnosis not present

## 2023-02-05 DIAGNOSIS — Z23 Encounter for immunization: Secondary | ICD-10-CM

## 2023-02-05 DIAGNOSIS — D649 Anemia, unspecified: Secondary | ICD-10-CM

## 2023-02-05 MED ORDER — WEGOVY 0.25 MG/0.5ML ~~LOC~~ SOAJ: 0.2500 mg | SUBCUTANEOUS | 0 refills | Status: DC

## 2023-02-05 NOTE — Progress Notes (Signed)
SUBJECTIVE:   CHIEF COMPLAINT / HPI:   [name] is a 57 y.o. female who presents today for follow-up of anemia with recent labs showing low-normal B12.  Pt reports lack of energy and has not been able to be as active because of this; also reports she has not don't a lot of activity given she has had so many bouts of illnesses related to recurrent aspiration pneumonia from hiatal hernia. She also reports recently losing her father and experiencing grief which also made her less likely to be active. She reports she is eating right and prepping her own food. Seeing a nutritionist regularly. She is trying to increase activity levels and is currently doing chair exercises and starting to run errands again.  B12 on 01/13/23 was 260. Recently started on increased iron supplementation from her GI doc. Pt is interested in getting additional labs to evaluate low-normal B12.   Currently being worked up by GI for hiatal hernia repair and gastric bypass surgery. Insurance previously denied Wegovy, pt had been working with Teacher, English as a foreign language about appealing decision. Has been denied an appeal once so far. Weight loss specialist she saw thought she was an excellent candidate for GLP-1 therapy.  She reports 5 episodes of aspiration pneumonia since spring 2024. Pt reports continued coughing. Pt reports getting a flu shot yearly and a "pneumonia shot" yearly.  Nutritional history: Normal breakfast: [place] yogurt or avocado on wheat toast or egg with veggies or leftovers from dinner Normal lunch: Protein shake with blueberries or small lunch like pickles and cheese Normal dinner: Eats before 6:30pm; chicken with vegetables (brussels sprouts, butter lettuce, green beans) and brown rice or low carb or regular wraps with shrimp/chicken and vegetables or sometimes just vegetables Rarely snacks with pickles and cheese or beets Occasionally has a dessert of greek yogurt with sugar free whipped cream mixed in  and fruits Drinks mostly water, sometimes flavored water; no sweet tea, soda Occasionally eats out, gets chicken breast and green beans or steak and broccoli   Exercise: Currently doing chair exercises 2-3 times a week Occasionally walking in department stores like [employer] and in grocery stores, trying to do this more frequently  PERTINENT  PMH / PSH: HTN, CHF, OSA, GERD, hx endometrial cancer, CKD, T2DM, hx CVA, hx DVT/PE, chronic anemia  OBJECTIVE:   BP 127/76   Pulse 81   Ht 5\' 5"  (1.651 m)   Wt (!) 366 lb 3.2 oz (166.1 kg)   LMP 10/16/2013 (Approximate)   SpO2 100%   BMI 60.94 kg/m   General: Pt is seated in exam chair. No acute distress. Uses cane to ambulate. Cardiovascular: RRR, no murmurs, rubs, gallops. Pulmonary: Normal work of breathing. Lungs clear to auscultation bilaterally. Occasional cough. Neuro/Psych: Alert and oriented to person, place, event, time. Normal affect.  ASSESSMENT/PLAN:   Assessment & Plan Normocytic anemia Pt reports increased fatigue, likely multifactorial due to chronic anemia and deconditioning. Iron supplementation recently increased to 325 mg daily by GI doctor. B12 on 01/13/23 was 260, and pt is concerned about this. Reassured pt that her level is not abnormally low, but will do additional testing to reassure. - Will check folate, homocysteine, and MMA at pt's request and given low-normal B12 - Continue iron supplementation with 325 mg daily with breakfast - Encouraged pt to remain active Morbid obesity (HCC) Pt with current BMI of 60.94. Trying to manage weight mostly with diet, seeing a nutritionist. Also trying to increase activity levels. Interested in trying  Wegovy to help with weight loss. - Prescription sent in for [facility]; will need to keep an eye on GI side effects if this is approved - Encouraged pt to continue managing diet and increasing activity level Routine adult health maintenance Flu shot given today   Governor Rooks, Medical  Student [facility]  Patient seen along with medical student [facility]. I personally evaluated this patient along with the student, and verified all aspects of the history, physical exam, and medical decision making as documented by the student. I agree with the student's documentation and have made all necessary edits.  Latrelle Dodrill, MD, IBCLC [facility]

## 2023-02-05 NOTE — Assessment & Plan Note (Addendum)
Pt reports increased fatigue, likely multifactorial due to chronic anemia and deconditioning. Iron supplementation recently increased to 325 mg daily by GI doctor. B12 on 01/13/23 was 260, and pt is concerned about this. Reassured pt that her level is not abnormally low, but will do additional testing to reassure. - Will check folate, homocysteine, and MMA at pt's request and given low-normal B12 - Continue iron supplementation with 325 mg daily with breakfast - Encouraged pt to remain active

## 2023-02-05 NOTE — Patient Instructions (Addendum)
[name],  It was lovely seeing you in clinic today! You came in to discuss a recent B12 level which was on the lower end of normal at 260.  We will work on [employer] 0.25mg  weekly, follow up in 4 weeks to see how you are tolerating it  Thank you for allowing [place] to be a part of your care team! Governor Rooks, medical student Dr. Levert Feinstein

## 2023-02-05 NOTE — Assessment & Plan Note (Addendum)
Pt with current BMI of 60.94. Trying to manage weight mostly with diet, seeing a nutritionist. Also trying to increase activity levels. Interested in trying Wegovy to help with weight loss. - Prescription sent in for [facility]; will need to keep an eye on GI side effects if this is approved - Encouraged pt to continue managing diet and increasing activity level

## 2023-02-07 DIAGNOSIS — Z0000 Encounter for general adult medical examination without abnormal findings: Secondary | ICD-10-CM | POA: Insufficient documentation

## 2023-02-07 NOTE — Assessment & Plan Note (Signed)
Flu shot given today

## 2023-02-11 DIAGNOSIS — E66813 Obesity, class 3: Secondary | ICD-10-CM | POA: Diagnosis not present

## 2023-02-11 DIAGNOSIS — Z6841 Body Mass Index (BMI) 40.0 and over, adult: Secondary | ICD-10-CM | POA: Diagnosis not present

## 2023-02-11 DIAGNOSIS — N183 Chronic kidney disease, stage 3 unspecified: Secondary | ICD-10-CM | POA: Diagnosis not present

## 2023-02-11 LAB — METHYLMALONIC ACID, SERUM: Methylmalonic Acid: 266 nmol/L (ref 0–378)

## 2023-02-11 LAB — FOLATE: Folate: 6.4 ng/mL (ref >3.0)

## 2023-02-11 LAB — HOMOCYSTEINE: Homocysteine: 24.1 umol/L — ABNORMAL HIGH (ref 0.0–14.5)

## 2023-02-12 ENCOUNTER — Telehealth: Payer: Self-pay

## 2023-02-12 NOTE — Telephone Encounter (Signed)
Patient LVM on nurse line requesting recent lab results.   She reports she does not have her mychart set up and would like a phone call.   Will forward to PCP.

## 2023-02-13 ENCOUNTER — Telehealth: Payer: Self-pay | Admitting: Family Medicine

## 2023-02-13 DIAGNOSIS — E538 Deficiency of other specified B group vitamins: Secondary | ICD-10-CM

## 2023-02-13 DIAGNOSIS — D649 Anemia, unspecified: Secondary | ICD-10-CM

## 2023-02-13 MED ORDER — FOLIC ACID 1 MG PO TABS: 1.0000 mg | ORAL_TABLET | Freq: Every day | ORAL | 3 refills | Status: DC

## 2023-02-13 NOTE — Telephone Encounter (Signed)
Called patient, see phone note, started on folic acid 1 mg daily

## 2023-02-13 NOTE — Telephone Encounter (Signed)
Called and confirmed that I was speaking with [name].   Discussed lab work showing normal folate levels, but normal methylmalonic acid and elevated homocysteine levels- consistent with folate deficiency.  Discussed will start folic acid 1 mg daily, sent in supply. Answered all questions and concerns.  Claris Che Aisa Schoeppner MD

## 2023-02-14 ENCOUNTER — Encounter: Payer: Self-pay | Admitting: Oncology

## 2023-02-14 ENCOUNTER — Telehealth: Payer: Self-pay

## 2023-02-14 ENCOUNTER — Other Ambulatory Visit (HOSPITAL_COMMUNITY): Payer: Self-pay

## 2023-02-14 NOTE — Telephone Encounter (Signed)
Pharmacy Patient Advocate Encounter   Received notification from CoverMyMeds that prior authorization for [facility] is required/requested.   Insurance verification completed.   The patient is insured through [place] .   Per test claim: PA required; PA submitted to above mentioned insurance via CoverMyMeds Key/confirmation #/[id number] [employer]. Status is pending 3

## 2023-02-16 MED ORDER — FOLIC ACID 1 MG PO TABS: 1.0000 mg | ORAL_TABLET | Freq: Every day | ORAL | 3 refills | Status: AC

## 2023-02-16 NOTE — Telephone Encounter (Signed)
Patient returns call to nurse line regarding folic acid prescription.   She reports that prescription was supposed to be sent to [facility] on [address].   Called and canceled at [facility] pharmacy. Sent to [facility] per patient request.   Veronda Prude, RN

## 2023-02-16 NOTE — Addendum Note (Signed)
Addended by: Veronda Prude on: 02/16/2023 10:56 AM   Modules accepted: Orders

## 2023-02-20 NOTE — Telephone Encounter (Signed)
Pharmacy Patient Advocate Encounter  Received notification from [facility] that Prior Authorization for [facility] has been APPROVED from 02/14/23 to 04/06/24

## 2023-02-23 NOTE — Progress Notes (Unsigned)
Cardiology Office Note:    Date:  02/24/2023   ID:  [name], DOB Aug 14, 1965, MRN [id number]  PCP:  Latrelle Dodrill, MD   [facility] Providers Cardiologist:  Parke Poisson, MD Cardiology APP:  Marcelino Duster, PA     Referring MD: Latrelle Dodrill, MD   Chief Complaint  Patient presents with   Follow-up  HTN  History of Present Illness:    [name] is a 57 y.o. female with a hx of morbid obesity, OSA on CPAP, DM2, endometrial and ovarian cancer s/p surgery and chemotherapy, HTN, HLD, and DVT/PE on xarelto. She was seen by Dr. Jacques Navy 07/12/20 and was awaiting upcoming colonoscopy for GI symptoms including vomiting after some meals. She underwent colonoscopy on 07/20/20 and was told she was coughing during the procedure. On the way home, she was SOB with chest pressure. She presented to [facility]. She was hypoxic, tachycardic, and had a mild troponin elevation. She also reported hemoptysis. CTA negative for recurrent PE, but did show LLL PNA with questionable aspiration. Cardiology was consulted. HST elevation felt due to demand ischemia in the setting of hypoxia and tachycardia. No additional cardiac testing at that time, but if symptoms persisted, she may require OP stress testing. I saw her in follow up 2022 and she was doing well.  In response to work-up for bariatric surgery, she underwent coronary CTA 01/25/2021 that showed a coronary calcium score of 0.  She was seen by Bernadene Person NP 04/23/21 and had lost over 40 pounds with diet and exercise in anticipation of her bariatric surgery.  She is now preparing for bariatric surgery and hiatal hernia repair.  She reports 5 episodes of aspiration pneumonia since spring 2024 with chronic cough.  Question if recurrent pneumonia due to hiatal hernia.  I last saw her in 01/2022 and she was not taking Lasix daily.  She returns today for scheduled annual follow-up.  She lost her father since I saw her  last.  She presents today because she states she lost her way while grieving her father's death.  Her weight has been fluctuating, but she is now back to the gym at [employer] and has recently been approved for [facility].  I congratulated her on her success, she is down approximately 10 pounds.  She has no cardiac complaints today.  She is deferring hiatal hernia repair which surgeons would like to complete in tandem with her bariatric surgery.  She would like to lose additional weight prior to undergoing surgery.  Following multiple bouts of aspiration pneumonia, she now requires nocturnal oxygen.  She brings her oxygen tank with her when exercising.  Due to irregular heartbeat heard on exam, I obtained an EKG that revealed normal sinus rhythm.  I suspect she is having PACs versus PVCs.  She needs routine lipid panel, I will add a magnesium and TSH.  Past Medical History:  Diagnosis Date   Allergy    Anemia    Anxiety    Arthritis    Blood transfusion without reported diagnosis    Cough    Cough 06/11/2018   Difficulty sleeping 06/11/2018   Diverticulitis with perforation 2010   DVT (deep vein thrombosis) in pregnancy 11/24/2016   DVT, lower extremity (HCC)    Endometrial cancer (HCC)    Enlarged lymph nodes 08/13/2017   Family history of adverse reaction to anesthesia    pts daughter had difficulty awakening following anesthesia, long time to wake up   GERD (  gastroesophageal reflux disease)    Headache    Hemoptysis 04/22/2018   History of bronchitis    History of ear infections    Hospital discharge follow-up 03/02/2019   Hx of migraines    Hyperlipidemia    Hypertension    IBS (irritable bowel syndrome)    Kidney infection    Lupus (systemic lupus erythematosus) (HCC) 02/2017   Morbid obesity (HCC)    Neck pain 01/13/2019   Ovarian cancer (HCC) dx'd 01/2015   PONV (postoperative nausea and vomiting)    Port-A-Cath in place    Right side   Portacath in place 09/12/2015   Pre-diabetes     Pyelonephritis    Right knee pain 11/28/2011   Scleroderma (HCC)    Screen for colon cancer 03/02/2019   Slow rate of speech 08/22/2015   Chronic from CVA.  She also has loss of left nasolabial fold and slight left facial droop/small asymmetry on the left side secondary to CVA   UTI (urinary tract infection) 05/07/2018    Past Surgical History:  Procedure Laterality Date   ABDOMINAL HYSTERECTOMY N/A 03/13/2015   Procedure: TOTAL HYSTERECTOMY ABDOMINAL BILATERAL SALPINGO OOPHORECTOMY RADICAL TUMOR DEBUKING;  Surgeon: Adolphus Birchwood, MD;  Location: [facility];  Service: Gynecology;  Laterality: N/A;   BOWEL RESECTION  03/13/2015   Procedure: SMALL BOWEL RESECTION;  Surgeon: Adolphus Birchwood, MD;  Location: [facility];  Service: Gynecology;;   CESAREAN SECTION  1983 and 1984   COLONOSCOPY WITH PROPOFOL N/A 07/19/2020   Procedure: COLONOSCOPY WITH PROPOFOL;  Surgeon: Sherrilyn Rist, MD;  Location: [facility];  Service: Gastroenterology;  Laterality: N/A;   COLOSTOMY  2008   COLOSTOMY TAKEDOWN     ESOPHAGOGASTRODUODENOSCOPY (EGD) WITH PROPOFOL N/A 12/21/2018   Procedure: ESOPHAGOGASTRODUODENOSCOPY (EGD) WITH PROPOFOL;  Surgeon: Sherrilyn Rist, MD;  Location: [facility];  Service: Gastroenterology;  Laterality: N/A;   HEMOSTASIS CLIP PLACEMENT  07/19/2020   Procedure: HEMOSTASIS CLIP PLACEMENT;  Surgeon: Sherrilyn Rist, MD;  Location: [facility];  Service: Gastroenterology;;   IR REMOVAL TUN ACCESS W/ PORT W/O FL MOD SED  12/17/2022   LAPAROTOMY N/A 03/13/2015   Procedure: EXPLORATORY LAPAROTOMY;  Surgeon: Adolphus Birchwood, MD;  Location: [facility];  Service: Gynecology;  Laterality: N/A;   POLYPECTOMY  07/19/2020   Procedure: POLYPECTOMY;  Surgeon: Sherrilyn Rist, MD;  Location: [facility];  Service: Gastroenterology;;   TONSILLECTOMY AND ADENOIDECTOMY  1997   VENTRAL HERNIA REPAIR  03/13/2015   Procedure: HERNIA REPAIR VENTRAL ADULT;  Surgeon: Adolphus Birchwood, MD;  Location: [facility];  Service: Gynecology;;     Current Medications: Current Meds  Medication Sig   acetaminophen (TYLENOL) 500 MG tablet Take 2 tablets (1,000 mg total) by mouth every 6 (six) hours as needed for moderate pain.   albuterol (PROVENTIL) (2.5 MG/3ML) 0.083% nebulizer solution Take 3 mLs (2.5 mg total) by nebulization every 6 (six) hours as needed for wheezing or shortness of breath.   albuterol (VENTOLIN HFA) 108 (90 Base) MCG/ACT inhaler INHALE 2 PUFFS INTO THE LUNGS EVERY 6 HOURS AS NEEDED FOR WHEEZING OR SHORTNESS OF BREATH   baclofen (LIORESAL) 10 MG tablet Take 1 tablet (10 mg total) by mouth 2 (two) times daily as needed for muscle spasms.   benzonatate (TESSALON) 100 MG capsule Take 1 capsule (100 mg total) by mouth 3 (three) times daily as needed for cough.   cetirizine (ZYRTEC) 10 MG tablet Take 10 mg by mouth daily.   Cholecalciferol (VITAMIN  D) 50 MCG (2000 UT) CAPS Take 2,000 Units by mouth daily.   diclofenac Sodium (VOLTAREN) 1 % GEL Apply to knee once daily as needed (Patient taking differently: Apply 4 g topically daily.)   ferrous sulfate 325 (65 FE) MG EC tablet Take 1 tablet (325 mg total) by mouth daily with breakfast.   fesoterodine (TOVIAZ) 8 MG TB24 tablet Take 1 tablet (8 mg total) by mouth daily.   fluticasone (FLONASE) 50 MCG/ACT nasal spray Place 2 sprays into both nostrils in the morning and at bedtime.   folic acid (FOLVITE) 1 MG tablet Take 1 tablet (1 mg total) by mouth daily.   furosemide (LASIX) 20 MG tablet Take 1 tablet (20 mg total) by mouth daily as needed for edema or fluid.   naltrexone (DEPADE) 50 MG tablet Take 25 mg by mouth daily.   omeprazole (PRILOSEC) 40 MG capsule TAKE 1 CAPSULE(40 MG) BY MOUTH IN THE MORNING AND AT BEDTIME   ondansetron (ZOFRAN) 4 MG tablet Take 1 tablet (4 mg total) by mouth every 6 (six) hours as needed for nausea.   OXYGEN Inhale into the lungs.   rivaroxaban (XARELTO) 20 MG TABS tablet Take 1 tablet (20 mg total) by mouth in the morning.   Tiotropium  Bromide-Olodaterol (STIOLTO RESPIMAT) 2.5-2.5 MCG/ACT AERS Inhale 2 puffs into the lungs daily.   triamcinolone cream (KENALOG) 0.1 % Apply 1 Application topically 2 (two) times daily as needed. APPLY TO THE AFFECTED AREA TWICE DAILY AS NEEDED FOR SKIN IRRITATION (Patient taking differently: Apply 1 Application topically 2 (two) times daily as needed (Skin irritation.).)   valACYclovir (VALTREX) 500 MG tablet TAKE 1 TABLET(500 MG) BY MOUTH TWICE DAILY FOR 3 DAYS AT START OF OUTBREAK   [DISCONTINUED] amLODipine-olmesartan (AZOR) 5-20 MG tablet Take 1 tablet by mouth daily.   [DISCONTINUED] ezetimibe (ZETIA) 10 MG tablet Take 1 tablet (10 mg total) by mouth daily.     Allergies:   Tape and Penicillins   Social History   Socioeconomic History   Marital status: Legally Separated    Spouse name: Not on file   Number of children: Not on file   Years of education: Not on file   Highest education level: Some college, no degree  Occupational History   Not on file  Tobacco Use   Smoking status: Never   Smokeless tobacco: Never  Vaping Use   Vaping status: Never Used  Substance and Sexual Activity   Alcohol use: Yes    Comment: Maybe wine every 6 months   Drug use: No   Sexual activity: Yes    Partners: Male    Comment: Told she could not have more children in 1986  Other Topics Concern   Not on file  Social History Narrative   Works part time at [employer] (13 years as of 2015) - former Merchandiser, retail, 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   Married x 23 years, recently separated (4/15).  No domestic violence.  + financial stress.     Lives previously with husband who moved out, now with daughter who has medical problems, including Hodgkin's disease, and granddaughter (age 52).     Uses seat belt.    Social Determinants of Health   Financial Resource Strain: Low Risk  (11/12/2022)   Received from [facility], [facility]   Overall Financial Resource Strain (CARDIA)     Difficulty of Paying Living Expenses: Not very hard  Food Insecurity: No Food Insecurity (11/12/2022)  Received from [facility], [facility]   Hunger Vital Sign    Worried About Running Out of Food in the Last Year: Never true    Ran Out of Food in the Last Year: Never true  Transportation Needs: No Transportation Needs (11/12/2022)   Received from [facility], [facility]   PRAPARE - Transportation    Lack of Transportation (Medical): No    Lack of Transportation (Non-Medical): No  Physical Activity: Insufficiently Active (11/12/2022)   [facility], [facility]   Exercise Vital Sign    Days of Exercise per Week: 3 days    Minutes of Exercise per Session: 30 min  Stress: No Stress Concern Present (11/12/2022)   Received from [facility], [facility] - Occupational Stress Questionnaire    Feeling of Stress : Only a little  Social Connections: Socially Integrated (11/12/2022)   Received from [facility], [facility]   Social Network    How would you rate your social network (family, work, friends)?: Good participation with social networks  Recent Concern: Social Connections - Moderately Isolated (09/19/2022)   Social Connection and Isolation Panel [NHANES]    Frequency of Communication with Friends and Family: More than three times a week    Frequency of Social Gatherings with Friends and Family: Three times a week    Attends Religious Services: More than 4 times per year    Active Member of Clubs or Organizations: No    Attends Banker Meetings: Never    Marital Status: Separated     Family History: The patient's family history includes Asthma in her maternal aunt; Colon polyps in her mother; Diabetes in her daughter, mother, paternal aunt, paternal aunt, paternal grandfather, and paternal uncle; Fibroids in her mother; Hodgkin's lymphoma (age of onset: 20) in her daughter; Hyperlipidemia in her father  and mother; Hypertension in her mother; Kidney failure (age of onset: 46) in her maternal grandmother; Lung cancer in her father; Pancreatic cancer in her paternal grandmother; Prostate cancer (age of onset: 71) in her maternal uncle. There is no history of Heart disease, Stroke, Colon cancer, Stomach cancer, Esophageal cancer, or Rectal cancer.  ROS:   Please see the history of present illness.     All other systems reviewed and are negative.  EKGs/Labs/Other Studies Reviewed:    The following studies were reviewed today:  Cardiac Studies & Procedures       ECHOCARDIOGRAM  ECHOCARDIOGRAM COMPLETE 06/25/2022  Narrative ECHOCARDIOGRAM REPORT    Patient Name:   HADIJA WAAG Date of Exam: 06/25/2022 Medical Rec #:  [id number]          Height:       65.0 in Accession #:    [id number]         Weight:       360.0 lb Date of Birth:  06/10/1965          BSA:          2.540 m Patient Age:    56 years           BP:           130/55 mmHg Patient Gender: F                  HR:           84 bpm. Exam Location:  Inpatient  Procedure: 2D Echo, Cardiac Doppler and Color Doppler  Indications:  H/o PE, aspiration pnemonia  History:        Patient has prior history of Echocardiogram examinations.  Sonographer:    Lucy Antigua Referring Phys: [phone] DAVID MANUEL ORTIZ  IMPRESSIONS   1. Left ventricular ejection fraction, by estimation, is 60 to 65%. The left ventricle has normal function. The left ventricle has no regional wall motion abnormalities. Left ventricular diastolic parameters are indeterminate. 2. Right ventricular systolic function is normal. The right ventricular size is normal. Tricuspid regurgitation signal is inadequate for assessing PA pressure. 3. The mitral valve is normal in structure. No evidence of mitral valve regurgitation. No evidence of mitral stenosis. 4. The aortic valve was not well visualized. Aortic valve regurgitation is not visualized. No aortic  stenosis is present. 5. IVC not visualized.  FINDINGS Left Ventricle: Left ventricular ejection fraction, by estimation, is 60 to 65%. The left ventricle has normal function. The left ventricle has no regional wall motion abnormalities. The left ventricular internal cavity size was normal in size. There is no left ventricular hypertrophy. Left ventricular diastolic parameters are indeterminate.  Right Ventricle: The right ventricular size is normal. No increase in right ventricular wall thickness. Right ventricular systolic function is normal. Tricuspid regurgitation signal is inadequate for assessing PA pressure.  Left Atrium: Left atrial size was normal in size.  Right Atrium: Right atrial size was normal in size.  Pericardium: Trivial pericardial effusion is present.  Mitral Valve: The mitral valve is normal in structure. No evidence of mitral valve regurgitation. No evidence of mitral valve stenosis.  Tricuspid Valve: The tricuspid valve is normal in structure. Tricuspid valve regurgitation is not demonstrated.  Aortic Valve: The aortic valve was not well visualized. Aortic valve regurgitation is not visualized. No aortic stenosis is present.  Pulmonic Valve: The pulmonic valve was normal in structure. Pulmonic valve regurgitation is not visualized.  Aorta: The aortic root is normal in size and structure.  Venous: The inferior vena cava was not well visualized.  IAS/Shunts: No atrial level shunt detected by color flow Doppler.  [employer] Electronically signed by Wilfred Lacy Signature Date/Time: 06/25/2022/4:16:03 PM    Final     CT SCANS  CT CORONARY MORPH W/CTA COR W/SCORE 01/25/2021  Addendum 01/25/2021  3:12 PM ADDENDUM REPORT: 01/25/2021 15:10  CLINICAL DATA:  57 Year old African American Female  EXAM: Cardiac/Coronary  CTA  TECHNIQUE: The patient was scanned on a [employer].  FINDINGS: Scan was triggered in the descending thoracic  aorta. Axial non-contrast 3 mm slices were carried out through the heart. The data set was analyzed on a dedicated work station and scored using the Agatson method. Gantry rotation speed was 250 msecs and collimation was .6 mm. 0.8 mg of sl NTG was given. The 3D data set was reconstructed in 5% intervals of the 67-82 % of the R-R cycle. Diastolic phases were analyzed on a dedicated work station using MPR, MIP and VRT modes. The patient received 140 cc of contrast, 120 kEV.  Aorta:  Normal size.  No dissection.  Main Pulmonary Artery:  Moderate dilation of the main pulmonary artery 32 mm  Aortic Valve: Tri-leaflet. Minimal calcification with calcium score of 8.  Coronary Arteries:  Normal coronary origin.  Right dominance.  Coronary Calcium Score:  Left main: 0  Left anterior descending artery: 0  Left circumflex artery: 0  Right coronary artery: 0  Total: 0  Percentile: 1st for age, sex, and race matched control.  RCA is a large dominant artery  that gives rise to PDA and PLA. There is no significant plaque.  Left main is a large artery that gives rise to LAD and LCX arteries. There is no significant plaque.  LAD is a large vessel that gives rise to one large D1 Branch. There is no significant plaque.  LCX is a non-dominant artery that gives rise to one large OM1 branch. No evidence of significant disease on axial MPR assessment.  Other findings:  Normal pulmonary vein drainage into the left atrium.  Normal left atrial appendage without a thrombus.  Notable attenuation secondary to habitus.  Extra-cardiac findings: See attached radiology report for non-cardiac structures.  IMPRESSION: 1. Coronary calcium score of 0. This was 1st percentile for age, sex, and race matched control.  2. Normal coronary origin with right dominance.  3. Moderate pulmonary artery dilation  4. Minimal aortic valve calcification, calcium score of 8.  5. There is no evidence  of significant plaque or coronary lesions; there is significant attenuation from habitus. LCx system assessed by axial MPR due to technically difficult imaging.  RECOMMENDATIONS:  Coronary artery calcium (CAC) score is a strong predictor of incident coronary heart disease (CHD) and provides predictive information beyond traditional risk factors. CAC scoring is reasonable to use in the decision to withhold, postpone, or initiate statin therapy in intermediate-risk or selected borderline-risk asymptomatic adults (age 62-75 years and LDL-C >=70 to <190 mg/dL) who do not have diabetes or established atherosclerotic cardiovascular disease (ASCVD).* In intermediate-risk (10-year ASCVD risk >=7.5% to <20%) adults or selected borderline-risk (10-year ASCVD risk >=5% to <7.5%) adults in whom a CAC score is measured for the purpose of making a treatment decision the following recommendations have been made:  If CAC = 0, it is reasonable to withhold statin therapy and reassess in 5 to 10 years, as long as higher risk conditions are absent (diabetes mellitus, family history of premature CHD in first degree relatives (males <55 years; females <65 years), cigarette smoking, LDL >=190 mg/dL or other independent risk factors).  If CAC is 1 to 99, it is reasonable to initiate statin therapy for patients >=76 years of age.  If CAC is >=100 or >=75th percentile, it is reasonable to initiate statin therapy at any age.  Cardiology referral should be considered for patients with CAC scores =400 or >=75th percentile.  *2018 AHA/ACC/AACVPR/AAPA/ABC/ACPM/ADA/AGS/APhA/ASPC/NLA/PCNA Guideline on the Management of Blood Cholesterol: A Report of the American College of Cardiology/American Heart Association Task Force on Clinical Practice Guidelines. J Am Coll Cardiol. 2019;73(24):3168-3209.  Riley Lam, MD   Electronically Signed By: Riley Lam M.D. On: 01/25/2021  15:10  Narrative EXAM: OVER-READ INTERPRETATION  CT CHEST  The following report is an over-read performed by radiologist Dr. Irish Lack of [facility] on 01/25/2021. This over-read does not include interpretation of cardiac or coronary anatomy or pathology. The coronary CTA interpretation by the cardiologist is attached.  COMPARISON:  CT a of the chest on 07/19/2020  FINDINGS: Vascular: Mild dilatation of central pulmonary arteries with the main pulmonary artery measuring up to approximately 3.2 cm.  Mediastinum/Nodes: No lymphadenopathy or masses identified. Moderate-sized hiatal hernia.  Lungs/Pleura: Dense central airspace opacity in the perihilar right upper lobe over region spanning approximately 2.5 cm, additional subpleural ground-glass airspace opacity in the posterior aspect of the right lower lobe over a region spanning approximately 2 cm and subpleural ground-glass airspace opacity in the posterior left lower lobe measuring approximately 2 cm. Correlation suggested with any active respiratory symptoms as findings  are suggestive of multifocal infection.  Upper Abdomen: No acute abnormality.  Musculoskeletal: No chest wall mass or suspicious bone lesions identified.  IMPRESSION: 1. Mild dilatation of central pulmonary arteries may be consistent with a component of underlying pulmonary hypertension. 2. Multifocal airspace opacities in the right upper lobe and both lower lobes. Findings are suggestive of multifocal infection and correlation is suggested with any active respiratory symptoms.  Electronically Signed: By: Irish Lack M.D. On: 01/25/2021 11:12           EKG Interpretation Date/Time:  Tuesday February 24 2023 16:26:30 EST Ventricular Rate:  79 PR Interval:  146 QRS Duration:  80 QT Interval:  388 QTC Calculation: 444 R Axis:   8  Text Interpretation: Normal sinus rhythm Anterior infarct , age undetermined When compared  with ECG of 19-Jul-2020 13:11, PREVIOUS ECG IS PRESENT Confirmed by Micah Flesher ([id number]) on 02/24/2023 4:36:58 PM    Recent Labs: 12/23/2022: BNP 49.7 01/13/2023: ALT 7; BUN 20; Creatinine, Ser 1.11; Hemoglobin 10.0; Platelets 249.0; Potassium 4.4; Sodium 141  Recent Lipid Panel    Component Value Date/Time   CHOL 225 (H) 04/26/2021 1034   TRIG 84 04/26/2021 1034   HDL 56 04/26/2021 1034   CHOLHDL 4.0 04/26/2021 1034   CHOLHDL 4.5 01/01/2018 1010   VLDL 19 01/01/2018 1010   LDLCALC 154 (H) 04/26/2021 1034   LDLDIRECT 164 (H) 07/26/2013 0933     Risk Assessment/Calculations:                Physical Exam:    VS:  BP 118/76 (BP Location: Left Arm, Patient Position: Sitting, Cuff Size: Large)   Pulse 73   Ht 5' 5.5" (1.664 m)   Wt (!) 366 lb 6.4 oz (166.2 kg)   LMP 10/16/2013 (Approximate)   SpO2 95%   BMI 60.04 kg/m     Wt Readings from Last 3 Encounters:  02/24/23 (!) 366 lb 6.4 oz (166.2 kg)  02/05/23 (!) 366 lb 3.2 oz (166.1 kg)  01/13/23 (!) 377 lb (171 kg)     GEN: obese female in NAD HEENT: Normal NECK: No JVD; No carotid bruits LYMPHATICS: No lymphadenopathy CARDIAC: RRR, no murmurs, rubs, gallops RESPIRATORY:  Clear to auscultation without rales, wheezing or rhonchi  ABDOMEN: Soft, non-tender, non-distended MUSCULOSKELETAL:  No edema; No deformity  SKIN: Warm and dry NEUROLOGIC:  Alert and oriented x 3 PSYCHIATRIC:  Normal affect   ASSESSMENT:    1. Palpitations   2. Essential hypertension, benign   3. Mixed hyperlipidemia   4. OSA (obstructive sleep apnea)   5. Chronic diastolic CHF (congestive heart failure) (HCC)   6. Morbid obesity (HCC)   7. Chronic anticoagulation   8. History of pulmonary embolism    PLAN:    In order of problems listed above:  Ectopy - Heard on exam, she was asymptomatic - Collect magnesium and TSH - EKG with NSR - No recent syncope or palpitations   Hypertension - amlodipine-olmesartan 5-20 mg -- BP well  controlled -- recent BMP stable   Hyperlipidemia with DL goal less than 403 4/74/2595: Cholesterol, Total 225; HDL 56; LDL Chol Calc (NIH) 154; Triglycerides 84 She did not tolerate rosuvastatin - Maintained on Zetia only - Will collect fasting lipid panel tomorrow morning   History of PE Chronic anticoagulation with Xarelto Managed by PCP   Grade 1 DD Echo 2021 showed preserved LVEF 60-65%, mild LVH, G1 DD. She is taking as needed Lasix - has not needed lasix is  several months   Follow-up in 1 year, sooner if needed.              Medication Adjustments/Labs and Tests Ordered: Current medicines are reviewed at length with the patient today.  Concerns regarding medicines are outlined above.  Orders Placed This Encounter  Procedures   Lipid panel   Magnesium   TSH   EKG 12-Lead   Meds ordered this encounter  Medications   amLODipine-olmesartan (AZOR) 5-20 MG tablet    Sig: Take 1 tablet by mouth daily.    Dispense:  90 tablet    Refill:  3    **Patient requests 90 days supply**   ezetimibe (ZETIA) 10 MG tablet    Sig: Take 1 tablet (10 mg total) by mouth daily.    Dispense:  90 tablet    Refill:  3    Patient Instructions  Medication Instructions:  Your physician recommends that you continue on your current medications as directed. Please refer to the Current Medication list given to you today.  *If you need a refill on your cardiac medications before your next appointment, please call your pharmacy*   Lab Work: Fasting lipid panel, magnesium, TSH tomorrow   Testing/Procedures: NONE ordered at this time of appointment     Follow-Up: At [facility], you and your health needs are our priority.  As part of our continuing mission to provide you with exceptional heart care, we have created designated Provider Care Teams.  These Care Teams include your primary Cardiologist (physician) and Advanced Practice Providers (APPs -  Physician Assistants  and Nurse Practitioners) who all work together to provide you with the care you need, when you need it.  We recommend signing up for the patient portal called "MyChart".  Sign up information is provided on this After Visit Summary.  MyChart is used to connect with patients for Virtual Visits (Telemedicine).  Patients are able to view lab/test results, encounter notes, upcoming appointments, etc.  Non-urgent messages can be sent to your provider as well.   To learn more about what you can do with MyChart, go to [email or web address].    Your next appointment:   1 year(s)  Provider:   Parke Poisson, MD  or Micah Flesher, PA-C        Other Instructions     Signed, Marcelino Duster, [place]  02/24/2023 4:39 PM    [facility]

## 2023-02-24 ENCOUNTER — Encounter: Payer: Self-pay | Admitting: Physician Assistant

## 2023-02-24 ENCOUNTER — Ambulatory Visit: Payer: Medicare HMO | Attending: Physician Assistant | Admitting: Physician Assistant

## 2023-02-24 VITALS — BP 118/76 | HR 73 | Ht 65.5 in | Wt 366.4 lb

## 2023-02-24 DIAGNOSIS — R002 Palpitations: Secondary | ICD-10-CM

## 2023-02-24 DIAGNOSIS — Z7901 Long term (current) use of anticoagulants: Secondary | ICD-10-CM

## 2023-02-24 DIAGNOSIS — I10 Essential (primary) hypertension: Secondary | ICD-10-CM | POA: Diagnosis not present

## 2023-02-24 DIAGNOSIS — Z86711 Personal history of pulmonary embolism: Secondary | ICD-10-CM

## 2023-02-24 DIAGNOSIS — G4733 Obstructive sleep apnea (adult) (pediatric): Secondary | ICD-10-CM

## 2023-02-24 DIAGNOSIS — E782 Mixed hyperlipidemia: Secondary | ICD-10-CM | POA: Diagnosis not present

## 2023-02-24 DIAGNOSIS — I5032 Chronic diastolic (congestive) heart failure: Secondary | ICD-10-CM | POA: Diagnosis not present

## 2023-02-24 MED ORDER — EZETIMIBE 10 MG PO TABS: 10.0000 mg | ORAL_TABLET | Freq: Every day | ORAL | 3 refills | Status: DC

## 2023-02-24 MED ORDER — AMLODIPINE-OLMESARTAN 5-20 MG PO TABS: 1.0000 | ORAL_TABLET | Freq: Every day | ORAL | 3 refills | Status: DC

## 2023-02-24 NOTE — Patient Instructions (Signed)
Medication Instructions:  Your physician recommends that you continue on your current medications as directed. Please refer to the Current Medication list given to you today.  *If you need a refill on your cardiac medications before your next appointment, please call your pharmacy*   Lab Work: Fasting lipid panel, magnesium, TSH tomorrow   Testing/Procedures: NONE ordered at this time of appointment     Follow-Up: At [facility], you and your health needs are our priority.  As part of our continuing mission to provide you with exceptional heart care, we have created designated Provider Care Teams.  These Care Teams include your primary Cardiologist (physician) and Advanced Practice Providers (APPs -  Physician Assistants and Nurse Practitioners) who all work together to provide you with the care you need, when you need it.  We recommend signing up for the patient portal called "MyChart".  Sign up information is provided on this After Visit Summary.  MyChart is used to connect with patients for Virtual Visits (Telemedicine).  Patients are able to view lab/test results, encounter notes, upcoming appointments, etc.  Non-urgent messages can be sent to your provider as well.   To learn more about what you can do with MyChart, go to [email or web address].    Your next appointment:   1 year(s)  Provider:   Parke Poisson, MD  or Micah Flesher, PA-C        Other Instructions

## 2023-02-25 DIAGNOSIS — R002 Palpitations: Secondary | ICD-10-CM | POA: Diagnosis not present

## 2023-02-25 DIAGNOSIS — I10 Essential (primary) hypertension: Secondary | ICD-10-CM | POA: Diagnosis not present

## 2023-02-25 NOTE — Telephone Encounter (Signed)
Error

## 2023-02-26 LAB — MAGNESIUM: Magnesium: 1.9 mg/dL (ref 1.6–2.3)

## 2023-02-26 LAB — LIPID PANEL
Chol/HDL Ratio: 3.2 ratio (ref 0.0–4.4)
Cholesterol, Total: 210 mg/dL — ABNORMAL HIGH (ref 100–199)
HDL: 66 mg/dL (ref >39)
LDL Chol Calc (NIH): 129 mg/dL — ABNORMAL HIGH (ref 0–99)
Triglycerides: 82 mg/dL (ref 0–149)
VLDL Cholesterol Cal: 15 mg/dL (ref 5–40)

## 2023-02-26 LAB — TSH: TSH: 3.05 u[IU]/mL (ref 0.450–4.500)

## 2023-03-09 ENCOUNTER — Other Ambulatory Visit: Payer: Self-pay | Admitting: Nurse Practitioner

## 2023-03-12 ENCOUNTER — Ambulatory Visit: Payer: Medicare HMO | Admitting: Student

## 2023-03-12 ENCOUNTER — Telehealth: Payer: Self-pay

## 2023-03-12 VITALS — BP 140/70 | HR 81 | Temp 98.1°F | Ht 65.0 in | Wt 376.0 lb

## 2023-03-12 DIAGNOSIS — J189 Pneumonia, unspecified organism: Secondary | ICD-10-CM | POA: Diagnosis not present

## 2023-03-12 MED ORDER — CEFDINIR 300 MG PO CAPS: 300.0000 mg | ORAL_CAPSULE | Freq: Two times a day (BID) | ORAL | 0 refills | Status: AC

## 2023-03-12 MED ORDER — AZITHROMYCIN 250 MG PO TABS: ORAL_TABLET | ORAL | 0 refills | Status: AC

## 2023-03-12 NOTE — Telephone Encounter (Signed)
Patient calls nurse line requesting an apt.   She reports has a strong hx of aspiration pneumonia and reports similar symptoms starting again. She reports she noticed a cough last night. She reports she coughed up a "dime" size amount of blood this morning associated with phlegm. She reports shortness of breath and a tmax of 99.9.  She reports she is using her oxygen and did a breathing treatment this morning. She was speaking in full sentences and did not sound in distress.   She reports she does not want to go to the emergency room and sit there for hours and hours.   Patient scheduled for this afternoon for evaluation.   Strict precautions discussed in the meantime.

## 2023-03-12 NOTE — Patient Instructions (Addendum)
It was great to see you! Thank you for allowing me to participate in your care!   Our plans for today:  - I am prescribing cefdinir for 7 days and azithromycin for 5 days - I am ordering a chest xray - If worsening shrotness of breath, increasing oxygen needs, fevers, blood increasing please go to emergency department  Take care and seek immediate care sooner if you develop any concerns.  Levin Erp, MD

## 2023-03-12 NOTE — Assessment & Plan Note (Signed)
Reurrent pneumonia in the setting of hiatal hernia.  She is reassuringly saturating well on room air in the office but does have her home oxygen with her for when she walks.  Given productive cough and fever will treat preemptively.  Less likely to be pulmonary embolism given no missed doses of her blood thinner.  Less likely to be CHF given no lower extremity swelling or orthopnea. - cefdinir (OMNICEF) 300 MG capsule; Take 1 capsule (300 mg total) by mouth 2 (two) times daily for 7 days.  - azithromycin (ZITHROMAX) 250 MG tablet; Take 2 tablets (500 mg total) by mouth daily for 1 day, THEN 1 tablet (250 mg total) daily for 4 days. - DG Chest 2 View; Future  - 1 week follow up

## 2023-03-12 NOTE — Progress Notes (Signed)
    SUBJECTIVE:   CHIEF COMPLAINT / HPI: Productive Cough  Patient call: She reports has a strong hx of aspiration pneumonia and reports similar symptoms starting again. She reports she noticed a cough last night. She reports she coughed up a "dime" size amount of blood this morning associated with phlegm. She reports shortness of breath and a tmax of 99.9. She reports she is using her oxygen and did a breathing treatment this morning. She was speaking in full sentences and did not sound in distress.  She reports she does not want to go to the emergency room and sit there for hours and hours.   Has been having with a week-long history of coughing and shortness of breath. They report waking up in the middle of the night with a productive cough, occasionally with blood, and intermittent low-grade fever yesterday which prompted visit today. They have been managing their symptoms at home with oxygen (2.5L at baseline with exertion and has not had to go up), a albuterol nebulizer, and inhaler. They also report a history of blood clots during their cancer treatment (now in remission) and are currently on Xarelto. They also report a lack of energy and difficulty breathing, even with simple tasks like going to the bathroom.  Does not any pleuritic chest pain.  Did have a fever to 100.9 this morning but none since then and came down with Tylenol.  The patient has been trying to lose weight in preparation for a hiatal hernia repair/gastric bypass and has recently started on Wegovy. They report a significant weight loss, but recurrent bouts of pneumonia have hindered their progress.   They have been using oxygen since March of this year and report that their oxygen levels fluctuate. They deny any recent antibiotic use, vomiting, diarrhea, or extra swelling.  PERTINENT  PMH / PSH: CHF, hx of recurrent aspiration PNA from hiatal hernia  OBJECTIVE:   BP (!) 140/70   Pulse 81   Temp 98.1 F (36.7 C) (Oral)    Ht 5\' 5"  (1.651 m)   Wt (!) 376 lb (170.6 kg)   LMP 10/16/2013 (Approximate)   SpO2 99%   BMI 62.57 kg/m   General: NAD, awake, alert, responsive to questions Head: Normocephalic atraumatic CV: Regular rate and rhythm no murmurs rubs or gallops Respiratory: chest rises symmetrically,  no increased work of breathing on room air, intermittent coarse crackles Extremities: Moves upper and lower extremities freely, no edema in LE, no calf tenderness  ASSESSMENT/PLAN:   Assessment & Plan Recurrent pneumonia Reurrent pneumonia in the setting of hiatal hernia.  She is reassuringly saturating well on room air in the office but does have her home oxygen with her for when she walks.  Given productive cough and fever will treat preemptively.  Less likely to be pulmonary embolism given no missed doses of her blood thinner.  Less likely to be CHF given no lower extremity swelling or orthopnea. - cefdinir (OMNICEF) 300 MG capsule; Take 1 capsule (300 mg total) by mouth 2 (two) times daily for 7 days.  - azithromycin (ZITHROMAX) 250 MG tablet; Take 2 tablets (500 mg total) by mouth daily for 1 day, THEN 1 tablet (250 mg total) daily for 4 days. - DG Chest 2 View; Future  - 1 week follow up   Levin Erp, MD [facility]

## 2023-03-13 ENCOUNTER — Ambulatory Visit
Admission: RE | Admit: 2023-03-13 | Discharge: 2023-03-13 | Disposition: A | Discharge status: Home / Self Care | Payer: Medicare HMO | Source: Ambulatory Visit | Attending: Family Medicine | Admitting: Family Medicine

## 2023-03-13 DIAGNOSIS — J189 Pneumonia, unspecified organism: Secondary | ICD-10-CM

## 2023-03-23 ENCOUNTER — Encounter: Payer: Self-pay | Admitting: Student

## 2023-04-01 ENCOUNTER — Other Ambulatory Visit: Payer: Self-pay | Admitting: Family Medicine

## 2023-04-02 ENCOUNTER — Other Ambulatory Visit: Payer: Self-pay

## 2023-04-02 MED ORDER — WEGOVY 0.25 MG/0.5ML ~~LOC~~ SOAJ: 0.2500 mg | SUBCUTANEOUS | 0 refills | Status: DC

## 2023-04-06 ENCOUNTER — Other Ambulatory Visit: Payer: Self-pay | Admitting: Family Medicine

## 2023-04-06 DIAGNOSIS — Z0000 Encounter for general adult medical examination without abnormal findings: Secondary | ICD-10-CM

## 2023-04-10 DIAGNOSIS — N183 Chronic kidney disease, stage 3 unspecified: Secondary | ICD-10-CM | POA: Diagnosis not present

## 2023-04-10 DIAGNOSIS — Z6841 Body Mass Index (BMI) 40.0 and over, adult: Secondary | ICD-10-CM | POA: Diagnosis not present

## 2023-04-10 DIAGNOSIS — E66813 Obesity, class 3: Secondary | ICD-10-CM | POA: Diagnosis not present

## 2023-04-13 DIAGNOSIS — E66813 Obesity, class 3: Secondary | ICD-10-CM | POA: Diagnosis not present

## 2023-04-13 DIAGNOSIS — Z8701 Personal history of pneumonia (recurrent): Secondary | ICD-10-CM | POA: Diagnosis not present

## 2023-04-13 DIAGNOSIS — K219 Gastro-esophageal reflux disease without esophagitis: Secondary | ICD-10-CM | POA: Diagnosis not present

## 2023-04-13 DIAGNOSIS — Z1330 Encounter for screening examination for mental health and behavioral disorders, unspecified: Secondary | ICD-10-CM | POA: Diagnosis not present

## 2023-04-13 DIAGNOSIS — I5032 Chronic diastolic (congestive) heart failure: Secondary | ICD-10-CM | POA: Diagnosis not present

## 2023-04-13 DIAGNOSIS — Z6841 Body Mass Index (BMI) 40.0 and over, adult: Secondary | ICD-10-CM | POA: Diagnosis not present

## 2023-04-13 DIAGNOSIS — I10 Essential (primary) hypertension: Secondary | ICD-10-CM | POA: Diagnosis not present

## 2023-04-14 ENCOUNTER — Telehealth: Payer: Self-pay | Admitting: *Deleted

## 2023-04-14 ENCOUNTER — Encounter: Payer: Self-pay | Admitting: Obstetrics & Gynecology

## 2023-04-14 NOTE — Telephone Encounter (Signed)
 Patient called and scheduled a follow up appt with Dr Tamela Oddi on 1/8

## 2023-04-15 ENCOUNTER — Inpatient Hospital Stay (HOSPITAL_BASED_OUTPATIENT_CLINIC_OR_DEPARTMENT_OTHER): Payer: Medicare HMO | Admitting: Obstetrics & Gynecology

## 2023-04-15 ENCOUNTER — Inpatient Hospital Stay: Payer: Medicare HMO | Attending: Obstetrics & Gynecology

## 2023-04-15 ENCOUNTER — Encounter: Payer: Self-pay | Admitting: Obstetrics & Gynecology

## 2023-04-15 VITALS — BP 140/62 | HR 108 | Temp 97.9°F | Resp 17 | Wt 369.8 lb

## 2023-04-15 DIAGNOSIS — C561 Malignant neoplasm of right ovary: Secondary | ICD-10-CM

## 2023-04-15 DIAGNOSIS — Z9079 Acquired absence of other genital organ(s): Secondary | ICD-10-CM | POA: Insufficient documentation

## 2023-04-15 DIAGNOSIS — Z90722 Acquired absence of ovaries, bilateral: Secondary | ICD-10-CM | POA: Insufficient documentation

## 2023-04-15 DIAGNOSIS — Z8542 Personal history of malignant neoplasm of other parts of uterus: Secondary | ICD-10-CM | POA: Diagnosis not present

## 2023-04-15 DIAGNOSIS — Z08 Encounter for follow-up examination after completed treatment for malignant neoplasm: Secondary | ICD-10-CM | POA: Diagnosis not present

## 2023-04-15 DIAGNOSIS — Z90710 Acquired absence of both cervix and uterus: Secondary | ICD-10-CM | POA: Insufficient documentation

## 2023-04-15 DIAGNOSIS — Z8543 Personal history of malignant neoplasm of ovary: Secondary | ICD-10-CM | POA: Diagnosis not present

## 2023-04-15 DIAGNOSIS — C541 Malignant neoplasm of endometrium: Secondary | ICD-10-CM

## 2023-04-15 DIAGNOSIS — Z9221 Personal history of antineoplastic chemotherapy: Secondary | ICD-10-CM | POA: Insufficient documentation

## 2023-04-15 NOTE — Progress Notes (Signed)
 Follow Up Note: Gyn-Onc  [name] Bunker 58 y.o. female  CC: She presents for a follow-up visit   HPI: The oncology history was reviewed.  Interval History: She denies vaginal bleeding, abdominal distention, pain, cough, lethargy, weight loss or change in her bowel habits. She reports a decrease in energy levels, which she attributes to low folic acid  and vitamin D levels. She also reports recurrent aspiration pneumonia, which she has experienced six times, believed to be due to a hernia.  In addition to these physical health concerns, the patient has been dealing with significant emotional stress. She lost her father to lung cancer in January of the previous year, which has had a profound impact on her mental well-being. She has sought counseling to help cope with this loss and has found comfort in the support of her family. Despite these challenges, she maintains a positive outlook and is committed to improving her health.   Review of Systems  Review of Systems  Constitutional:  Negative for malaise/fatigue and weight loss.  Respiratory:  Negative for shortness of breath and wheezing.   Cardiovascular:  Negative for chest pain and leg swelling.  Gastrointestinal:  Negative for abdominal pain, blood in stool, constipation, nausea and vomiting.  Genitourinary:  Negative for dysuria, frequency, hematuria and urgency.  Musculoskeletal:  Negative for joint pain and myalgias.  Neurological:  Negative for weakness.  Psychiatric/Behavioral:  Negative for depression. The patient does not have insomnia.    Current medications, allergy, social history, past surgical history, past medical history, family history were all reviewed.    Vitals: BP (!) 140/62 (BP Location: Left Arm, Patient Position: Sitting)   Pulse (!) 108   Temp 97.9 F (36.6 C) (Oral)   Resp 17   Wt (!) 369 lb 12.8 oz (167.7 kg)   LMP 10/16/2013 (Approximate)   BMI 61.54 kg/m    Physical Exam:  Physical Exam Exam  conducted with a chaperone present.  Constitutional:      General: She is not in acute distress. Cardiovascular:     Rate and Rhythm: Normal rate and regular rhythm.  Pulmonary:     Effort: Pulmonary effort is normal.     Breath sounds: Normal breath sounds. No wheezing or rhonchi.  Abdominal:     Palpations: Abdomen is soft.     Tenderness: There is no abdominal tenderness. There is no right CVA tenderness or left CVA tenderness.     Hernia: No hernia is present.  Genitourinary:    General: Normal vulva.     Urethra: No urethral lesion.     Vagina: No lesions. No bleeding Musculoskeletal:     Cervical back: Neck supple.     Right lower leg: No edema.     Left lower leg: No edema.  Lymphadenopathy:     Upper Body:     Right upper body: No supraclavicular adenopathy.     Left upper body: No supraclavicular adenopathy.     Lower Body: No right inguinal adenopathy. No left inguinal adenopathy.  Skin:    Findings: No rash.  Neurological:     Mental Status: She is oriented to person, place, and time.   Assessment/Plan: Endometrial ca  Ovarian carcinosarcoma, right (HCC) 58 y.o.  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  She is s/p exploratory laparotomy and TAH, BSO, optimal cytoreduction to no gross residual visible disease on 03/13/15. S/p 6 cycles of adjuvant carboplatin  and paclitaxel  chemotherapy completed 08/02/15. Genetic  testing BRCA negative. Complete clinical response (no evidence of disease).  Negative symptom review, normal exam, normal tumor markers.  No evidence of recurrence    >Continue annual surveillance; f/u w/a generalist is appropriate    I personally spent 25 minutes face-to-face and non-face-to-face in the care of this patient, which includes all pre, intra, and post visit time on the date of service.   [place]lam [place], MD

## 2023-04-15 NOTE — Assessment & Plan Note (Signed)
 58 y.o.  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  She is s/p exploratory laparotomy and TAH, BSO, optimal cytoreduction to no gross residual visible disease on 03/13/15. S/p 6 cycles of adjuvant carboplatin  and paclitaxel  chemotherapy completed 08/02/15. Genetic testing BRCA negative. Complete clinical response (no evidence of disease).  Negative symptom review, normal exam, normal tumor markers.  No evidence of recurrence    >Continue annual surveillance; f/u w/a generalist is appropriate

## 2023-04-15 NOTE — Patient Instructions (Signed)
 Return in 1 year ?

## 2023-04-16 LAB — CA 125: Cancer Antigen (CA) 125: 2.6 U/mL (ref 0.0–38.1)

## 2023-04-24 ENCOUNTER — Telehealth: Payer: Self-pay | Admitting: Surgery

## 2023-04-24 DIAGNOSIS — G4733 Obstructive sleep apnea (adult) (pediatric): Secondary | ICD-10-CM | POA: Diagnosis not present

## 2023-04-24 DIAGNOSIS — C569 Malignant neoplasm of unspecified ovary: Secondary | ICD-10-CM | POA: Diagnosis not present

## 2023-04-24 DIAGNOSIS — K219 Gastro-esophageal reflux disease without esophagitis: Secondary | ICD-10-CM | POA: Diagnosis not present

## 2023-04-24 DIAGNOSIS — N183 Chronic kidney disease, stage 3 unspecified: Secondary | ICD-10-CM | POA: Diagnosis not present

## 2023-04-24 DIAGNOSIS — I10 Essential (primary) hypertension: Secondary | ICD-10-CM | POA: Diagnosis not present

## 2023-04-24 DIAGNOSIS — Z8701 Personal history of pneumonia (recurrent): Secondary | ICD-10-CM | POA: Diagnosis not present

## 2023-04-24 DIAGNOSIS — I5032 Chronic diastolic (congestive) heart failure: Secondary | ICD-10-CM | POA: Diagnosis not present

## 2023-04-24 NOTE — Telephone Encounter (Signed)
-----   Message from Doylene Bode sent at 04/24/2023  9:51 AM EST ----- Please call and let her know her CA 125 is at 2.6 which is within normal range and stable for her. Good news!

## 2023-04-24 NOTE — Telephone Encounter (Signed)
Relayed CA 125 results of 2.6 to patient. She expressed thanks for this information and had no other concerns at this time.

## 2023-04-29 ENCOUNTER — Ambulatory Visit: Payer: Medicare HMO

## 2023-04-29 ENCOUNTER — Ambulatory Visit: Payer: Medicare HMO | Admitting: Pulmonary Disease

## 2023-04-29 ENCOUNTER — Encounter: Payer: Self-pay | Admitting: Pulmonary Disease

## 2023-04-29 VITALS — BP 134/88 | HR 84 | Ht 65.5 in | Wt 359.4 lb

## 2023-04-29 DIAGNOSIS — J690 Pneumonitis due to inhalation of food and vomit: Secondary | ICD-10-CM

## 2023-04-29 DIAGNOSIS — J9601 Acute respiratory failure with hypoxia: Secondary | ICD-10-CM | POA: Diagnosis not present

## 2023-04-29 DIAGNOSIS — J4520 Mild intermittent asthma, uncomplicated: Secondary | ICD-10-CM | POA: Diagnosis not present

## 2023-04-29 DIAGNOSIS — R918 Other nonspecific abnormal finding of lung field: Secondary | ICD-10-CM | POA: Diagnosis not present

## 2023-04-29 DIAGNOSIS — K219 Gastro-esophageal reflux disease without esophagitis: Secondary | ICD-10-CM | POA: Diagnosis not present

## 2023-04-29 DIAGNOSIS — G4733 Obstructive sleep apnea (adult) (pediatric): Secondary | ICD-10-CM | POA: Diagnosis not present

## 2023-04-29 DIAGNOSIS — R059 Cough, unspecified: Secondary | ICD-10-CM | POA: Diagnosis not present

## 2023-04-29 MED ORDER — STIOLTO RESPIMAT 2.5-2.5 MCG/ACT IN AERS: 2.0000 | INHALATION_SPRAY | Freq: Every day | RESPIRATORY_TRACT | 11 refills | Status: AC

## 2023-04-29 MED ORDER — MOXIFLOXACIN HCL 400 MG PO TABS: 400.0000 mg | ORAL_TABLET | Freq: Every day | ORAL | 0 refills | Status: DC

## 2023-04-29 NOTE — Progress Notes (Signed)
@Patient  ID: [name], female    DOB: Aug 27, 1965, 58 y.o.   MRN: [id number]  Chief Complaint  Patient presents with   Follow-up    F/U visit    Referring provider: Latrelle Dodrill, MD  HPI:   58 y.o. woman whom we are seeing in for in follow up of recurrent pneumonia due to aspiration.  Most recent OB/GYN note reviewed, most recent surgery notes reviewed.  Most recent cardiology note reviewed.  Most recent PCP note reviewed.  Overall doing okay.  Had worsening cough elevated temperature no frank fever 03/2023.  Seen by PCP.  Chest x-ray on my review interpretation reveals bilateral lower lobe infiltrates concerning for aspiration.  Placed on azithromycin and cefdinir per her report.  Helped a little bit.  She continues with cough.  Productive.  Bring up sputum.  She is taking Stiolto 2 puffs daily.  She does feel like this helps her dyspnea quite a bit.  We discussed chest x-ray today.  Aggressive antibiotics given her history.  She continue inhalers.   HPI at initial visit: Patient reports history of recurrent pneumonia.  For started after diagnosis of concomitant ovarian and endometrial cancer.  She received chemotherapy for this.  Also had hysterectomy.  Last chemotherapy dose about 6 years ago.  Notably, port still in place.  Ever since then she has gotten pneumonias at least once a year.  Treated with antibiotics and usually symptoms improved with course of weeks.  She reports recent aspiration pneumonia, diagnosed earlier in 2023.  Treated with antibiotics improved.  She had recurrent pneumonia 11/2021 on chest x-ray, my review and interpretation reveals nodular opacities bilaterally, right greater than left with more confluent densities right lower lung field.  Symptoms were preceded by sinus infection.  She received antibiotics for this.  Developed pneumonia in spite of this.  Required 2 additional courses of antibiotics.  Repeat chest x-ray 12/2021 my review  interpretation reveals similar distribution of nodular opacities bilaterally right greater than left with interval decrease in size and decrease confluence in the right lower lung field indicative of improving infectious pneumonia.  She had the Streptococcus immunization last year.  She reports he has a hiatal hernia.  She reports significant reflux symptoms.  On a PPI which helps with the burning but bad reflux.  She wakes up on a nightly basis with coughing.  PMH: Ovarian and endometrial cancer in remission, GERD, childhood asthma, history of recurrent PE on chronic Xarelto Surgical history: Hysterectomy, bowel resection, C-section, ventral hernia repair, tonsillectomy Family history: Mother with diabetes, hypertension, hyperlipidemia, father with hyperlipidemia Social history: Never smoker, lives in [place] / Pulmonary Flowsheets:   ACT:      No data to display          MMRC:     No data to display          Epworth:      No data to display          Tests:   FENO:  No results found for: "NITRICOXIDE"  PFT:     No data to display          WALK:     07/08/2022   10:55 AM  SIX MIN WALK  Supplimental Oxygen during Test? (L/min) No  Tech Comments: Pt slow paced drop and the end of the lap, she recovered once sitting back to 96%    Imaging: Personally reviewed and as per EMR and discussion in this note  No results found.  Lab Results: Personally reviewed CBC    Component Value Date/Time   WBC 6.3 01/13/2023 1110   RBC 4.14 01/13/2023 1110   HGB 10.0 (L) 01/13/2023 1110   HGB 9.4 (L) 10/20/2022 1151   HGB 11.6 01/17/2016 0928   HCT 32.0 (L) 01/13/2023 1110   HCT 29.9 (L) 10/20/2022 1151   HCT 35.2 01/17/2016 0928   PLT 249.0 01/13/2023 1110   PLT 233 10/20/2022 1151   MCV 77.3 (L) 01/13/2023 1110   MCV 79 10/20/2022 1151   MCV 93.1 01/17/2016 0928   MCH 24.9 (L) 10/20/2022 1151   MCH 26.3 06/28/2022 0252   MCHC 31.1 01/13/2023  1110   RDW 16.5 (H) 01/13/2023 1110   RDW 16.4 (H) 10/20/2022 1151   RDW 14.0 01/17/2016 0928   LYMPHSABS 1.1 09/16/2022 0941   LYMPHSABS 1.9 01/17/2016 0928   MONOABS 0.6 11/26/2020 1108   MONOABS 0.5 01/17/2016 0928   EOSABS 0.2 09/16/2022 0941   BASOSABS 0.1 09/16/2022 0941   BASOSABS 0.0 01/17/2016 0928    BMET    Component Value Date/Time   NA 141 01/13/2023 1110   NA 142 09/16/2022 0941   NA 140 01/17/2016 0928   K 4.4 01/13/2023 1110   K 4.8 01/17/2016 0928   CL 105 01/13/2023 1110   CO2 28 01/13/2023 1110   CO2 22 01/17/2016 0928   GLUCOSE 91 01/13/2023 1110   GLUCOSE 103 01/17/2016 0928   BUN 20 01/13/2023 1110   BUN 21 09/16/2022 0941   BUN 28.7 (H) 01/17/2016 0928   CREATININE 1.11 01/13/2023 1110   CREATININE 1.1 01/17/2016 0928   CALCIUM 9.6 01/13/2023 1110   CALCIUM 10.0 01/17/2016 0928   GFRNONAA 56 (L) 06/28/2022 0252   GFRNONAA 77 02/26/2015 0945   GFRAA 59 (L) 07/20/2019 1004   GFRAA 89 02/26/2015 0945    BNP    Component Value Date/Time   BNP 49.7 12/23/2022 1702   BNP 68.3 06/25/2022 0452    ProBNP No results found for: "PROBNP"  Specialty Problems       Pulmonary Problems   Bloody sputum   OSA (obstructive sleep apnea)   Bronchospasm   Acute recurrent maxillary sinusitis   Multifocal pneumonia   Acute respiratory failure with hypoxia (HCC)   Aspiration into respiratory tract   Recurrent pneumonia   Congestion of upper airway    Allergies  Allergen Reactions   Tape Itching    Surgical tape   Penicillins Rash    Has patient had a PCN reaction causing immediate rash, facial/tongue/throat swelling, SOB or lightheadedness with hypotension: no Has patient had a PCN reaction causing severe rash involving mucus membranes or skin necrosis: no Has patient had a PCN reaction that required hospitalization in the hospital at the time Has patient had a PCN reaction occurring within the last 10 years: no If all of the above answers are  "NO", then may proceed with Cephalosporin use.     Immunization History  Administered Date(s) Administered   Influenza Split 06/10/2011   Influenza, Seasonal, Injecte, Preservative Fre 02/05/2023   Influenza,inj,Quad PF,6+ Mos 12/12/2014, 01/17/2016, 12/04/2016, 01/14/2018, 02/01/2019, 01/05/2020, 12/20/2020, 01/09/2022   Moderna Sars-Covid-2 Vaccination 06/24/2019, 07/25/2019   PFIZER(Purple Top)SARS-COV-2 Vaccination 03/15/2020   PNEUMOCOCCAL CONJUGATE-20 12/20/2020   PPD Test 02/02/2019   Td 07/11/2008   Tdap 05/30/2022    Past Medical History:  Diagnosis Date   Allergy    Anemia    Anxiety    Arthritis  Blood transfusion without reported diagnosis    Cough    Cough 06/11/2018   Difficulty sleeping 06/11/2018   Diverticulitis with perforation 2010   DVT (deep vein thrombosis) in pregnancy 11/24/2016   DVT, lower extremity (HCC)    Endometrial cancer (HCC)    Enlarged lymph nodes 08/13/2017   Family history of adverse reaction to anesthesia    pts daughter had difficulty awakening following anesthesia, long time to wake up   GERD (gastroesophageal reflux disease)    Headache    Hemoptysis 04/22/2018   History of bronchitis    History of ear infections    Hospital discharge follow-up 03/02/2019   Hx of migraines    Hyperlipidemia    Hypertension    IBS (irritable bowel syndrome)    Kidney infection    Lupus (systemic lupus erythematosus) (HCC) 02/2017   Morbid obesity (HCC)    Neck pain 01/13/2019   Ovarian cancer (HCC) dx'd 01/2015   PONV (postoperative nausea and vomiting)    Port-A-Cath in place    Right side   Portacath in place 09/12/2015   Pre-diabetes    Pyelonephritis    Right knee pain 11/28/2011   Scleroderma (HCC)    Screen for colon cancer 03/02/2019   Slow rate of speech 08/22/2015   Chronic from CVA.  She also has loss of left nasolabial fold and slight left facial droop/small asymmetry on the left side secondary to CVA   UTI (urinary tract infection)  05/07/2018    Tobacco History: Social History   Tobacco Use  Smoking Status Never  Smokeless Tobacco Never   Counseling given: Not Answered   Continue to not smoke  Outpatient Encounter Medications as of 04/29/2023  Medication Sig   acetaminophen (TYLENOL) 500 MG tablet Take 2 tablets (1,000 mg total) by mouth every 6 (six) hours as needed for moderate pain.   albuterol (PROVENTIL) (2.5 MG/3ML) 0.083% nebulizer solution Take 3 mLs (2.5 mg total) by nebulization every 6 (six) hours as needed for wheezing or shortness of breath.   albuterol (VENTOLIN HFA) 108 (90 Base) MCG/ACT inhaler INHALE 2 PUFFS INTO THE LUNGS EVERY 6 HOURS AS NEEDED FOR WHEEZING OR SHORTNESS OF BREATH   amLODipine-olmesartan (AZOR) 5-20 MG tablet Take 1 tablet by mouth daily.   baclofen (LIORESAL) 10 MG tablet Take 1 tablet (10 mg total) by mouth 2 (two) times daily as needed for muscle spasms.   benzonatate (TESSALON) 100 MG capsule Take 1 capsule (100 mg total) by mouth 3 (three) times daily as needed for cough.   cetirizine (ZYRTEC) 10 MG tablet Take 10 mg by mouth daily.   Cholecalciferol (VITAMIN D) 50 MCG (2000 UT) CAPS Take 2,000 Units by mouth daily.   diclofenac Sodium (VOLTAREN) 1 % GEL Apply to knee once daily as needed (Patient taking differently: Apply 4 g topically daily.)   ezetimibe (ZETIA) 10 MG tablet Take 1 tablet (10 mg total) by mouth daily.   ferrous sulfate 325 (65 FE) MG EC tablet Take 1 tablet (325 mg total) by mouth daily with breakfast.   fesoterodine (TOVIAZ) 8 MG TB24 tablet Take 1 tablet (8 mg total) by mouth daily.   fluticasone (FLONASE) 50 MCG/ACT nasal spray Place 2 sprays into both nostrils in the morning and at bedtime.   folic acid (FOLVITE) 1 MG tablet Take 1 tablet (1 mg total) by mouth daily.   furosemide (LASIX) 20 MG tablet Take 1 tablet (20 mg total) by mouth daily as needed for edema or  fluid.   moxifloxacin (AVELOX) 400 MG tablet Take 1 tablet (400 mg total) by mouth  daily.   naltrexone (DEPADE) 50 MG tablet Take 25 mg by mouth daily.   omeprazole (PRILOSEC) 40 MG capsule TAKE 1 CAPSULE(40 MG) BY MOUTH IN THE MORNING AND AT BEDTIME   ondansetron (ZOFRAN) 4 MG tablet Take 1 tablet (4 mg total) by mouth every 6 (six) hours as needed for nausea.   OXYGEN Inhale into the lungs.   rivaroxaban (XARELTO) 20 MG TABS tablet Take 1 tablet (20 mg total) by mouth in the morning.   Semaglutide-Weight Management (WEGOVY) 0.25 MG/0.5ML SOAJ Inject 0.25 mg into the skin once a week.   triamcinolone cream (KENALOG) 0.1 % Apply 1 Application topically 2 (two) times daily as needed. APPLY TO THE AFFECTED AREA TWICE DAILY AS NEEDED FOR SKIN IRRITATION (Patient taking differently: Apply 1 Application topically 2 (two) times daily as needed (Skin irritation.).)   valACYclovir (VALTREX) 500 MG tablet TAKE 1 TABLET(500 MG) BY MOUTH TWICE DAILY FOR 3 DAYS AT START OF OUTBREAK   [DISCONTINUED] Tiotropium Bromide-Olodaterol (STIOLTO RESPIMAT) 2.5-2.5 MCG/ACT AERS Inhale 2 puffs into the lungs daily.   Tiotropium Bromide-Olodaterol (STIOLTO RESPIMAT) 2.5-2.5 MCG/ACT AERS Inhale 2 puffs into the lungs daily.   No facility-administered encounter medications on file as of 04/29/2023.     Review of Systems  Review of Systems  N/a Physical Exam  BP 134/88 (BP Location: Right Wrist, Patient Position: Sitting, Cuff Size: Normal)   Pulse 84   Ht 5' 5.5" (1.664 m)   Wt (!) 359 lb 6.4 oz (163 kg)   LMP 10/16/2013 (Approximate)   SpO2 99%   BMI 58.90 kg/m   Wt Readings from Last 5 Encounters:  04/29/23 (!) 359 lb 6.4 oz (163 kg)  04/15/23 (!) 369 lb 12.8 oz (167.7 kg)  03/12/23 (!) 376 lb (170.6 kg)  02/24/23 (!) 366 lb 6.4 oz (166.2 kg)  02/05/23 (!) 366 lb 3.2 oz (166.1 kg)    BMI Readings from Last 5 Encounters:  04/29/23 58.90 kg/m  04/15/23 61.54 kg/m  03/12/23 62.57 kg/m  02/24/23 60.04 kg/m  02/05/23 60.94 kg/m     Physical Exam General: Sitting in chair, no  acute distress Eyes: EOMI, no icterus Neck: Supple, no JVP Pulmonary: Clear, normal work of breathing Cardiovascular warm, no edema Abdomen: Nondistended, bowel sounds present MSK: No synovitis, no joint effusion Neuro: Normal gait, no weakness Psych: Normal mood, full affect   Assessment & Plan:   Recurrent pneumonias: Presumably normal immune system.  Immunoglobulin levels normal.  She has a hiatal hernia and endorses coughing at night frequently.  High suspicion for silent or frank aspiration particular with lying supine related to hiatal hernia and reflux causing recurrent pneumonias and x-ray changes.  Most recent 03/2023.  Treated with azithromycin and cefdinir.  Mild improvement.  Repeat x-ray today.  With ongoing cough 7-day course of moxifloxacin prescribed to target anaerobes.  Has penicillin allergy.  GERD/recurrent aspiration: Continue PPI.  Counseled lifestyle modification many which she is doing related to diet and time of eating.  Recommend wedge for elevating head post on bed.  Fortunately, she had recently ordered a wedge pillow.  If this is not comfortable instructed her on how to elevate head of bed with phone that she has at home.  She has been losing weight and was congratulated on this effort.  Has additional 50 or so pounds to get before bariatric surgery and what sounds like plan Niesen fundoplication.  I think her symptoms of cough as well as recurrent pneumonias will be difficult to control with hiatal hernia unsecured.  Asthma: Diagnosed as a child.  Symptoms not too bad as older adult.  Does use albuterol with coughing fits and with some shortness of breath.  Particular when she is ill with pneumonia.  She does find this beneficial.  Sample of Anoro was helpful.  Placed on Stiolto and per insurance preference, with improvement.  Stiolto refilled today..  Avoiding ICS given recurrent pneumonias.  Acute hypoxemic respiratory failure: No signs of severe aspiration  pneumonia.  Weaned off oxygen during the day.  Using at night as below.  Obstructive sleep apnea: Home sleep test revealed OSA.  Ordered via Dr. Wynona Neat.  Continue nocturnal oxygen for now.     Return in about 6 months (around 10/27/2023) for f/u Dr. Judeth Horn.   Karren Burly, MD 04/29/2023

## 2023-04-29 NOTE — Patient Instructions (Signed)
Nice to see you again  Continue Stiolto 2 puffs once a day, refilled today  Take moxifloxacin 1 tablet once a day for 7 days for the cough  Chest x-ray today to make sure prior pneumonia is getting better  Return to clinic in 6 months or sooner as needed with Dr. Judeth Horn

## 2023-05-02 NOTE — Progress Notes (Signed)
CXR shows improvement.

## 2023-05-03 ENCOUNTER — Other Ambulatory Visit: Payer: Self-pay | Admitting: Nurse Practitioner

## 2023-05-08 ENCOUNTER — Other Ambulatory Visit: Payer: Self-pay

## 2023-05-08 MED ORDER — WEGOVY 0.25 MG/0.5ML ~~LOC~~ SOAJ: 0.2500 mg | SUBCUTANEOUS | 1 refills | Status: DC

## 2023-05-08 NOTE — Telephone Encounter (Signed)
Patient returns call to nurse line regarding refill.   She is asking if she should remain on the current dosage or if she needs to increase.   She states that she has lost 18 lbs since starting medication.   Denies adverse side effects.   Please advise.   Veronda Prude, RN

## 2023-05-08 NOTE — Telephone Encounter (Signed)
If she is losing weight on the lower dose, let's stick with it! That is amazing weight loss. I will refill the 0.25mg  weekly dose. Please have her schedule a follow up visit with me for her weight when she is able.  Thanks!  Latrelle Dodrill, MD

## 2023-05-11 ENCOUNTER — Ambulatory Visit
Admission: RE | Admit: 2023-05-11 | Discharge: 2023-05-11 | Disposition: A | Discharge status: Home / Self Care | Payer: Medicare HMO | Source: Ambulatory Visit | Attending: Family Medicine | Admitting: Family Medicine

## 2023-05-11 ENCOUNTER — Other Ambulatory Visit: Payer: Self-pay | Admitting: Family Medicine

## 2023-05-11 ENCOUNTER — Other Ambulatory Visit: Payer: Self-pay | Admitting: Nurse Practitioner

## 2023-05-11 DIAGNOSIS — Z1231 Encounter for screening mammogram for malignant neoplasm of breast: Secondary | ICD-10-CM | POA: Diagnosis not present

## 2023-05-11 DIAGNOSIS — R7989 Other specified abnormal findings of blood chemistry: Secondary | ICD-10-CM

## 2023-05-11 DIAGNOSIS — D509 Iron deficiency anemia, unspecified: Secondary | ICD-10-CM

## 2023-05-11 DIAGNOSIS — Z0000 Encounter for general adult medical examination without abnormal findings: Secondary | ICD-10-CM

## 2023-05-11 NOTE — Telephone Encounter (Signed)
Called patient and informed of PCP message.   Scheduled patient for follow up on 05/21/23.  Veronda Prude, RN

## 2023-05-15 ENCOUNTER — Other Ambulatory Visit (INDEPENDENT_AMBULATORY_CARE_PROVIDER_SITE_OTHER): Payer: Medicare HMO

## 2023-05-15 DIAGNOSIS — R7989 Other specified abnormal findings of blood chemistry: Secondary | ICD-10-CM

## 2023-05-15 DIAGNOSIS — D509 Iron deficiency anemia, unspecified: Secondary | ICD-10-CM

## 2023-05-15 LAB — CBC WITH DIFFERENTIAL/PLATELET
Basophils Absolute: 0.1 10*3/uL (ref 0.0–0.1)
Basophils Relative: 1.2 % (ref 0.0–3.0)
Eosinophils Absolute: 0.3 10*3/uL (ref 0.0–0.7)
Eosinophils Relative: 5.7 % — ABNORMAL HIGH (ref 0.0–5.0)
HCT: 37.8 % (ref 36.0–46.0)
Hemoglobin: 12 g/dL (ref 12.0–15.0)
Lymphocytes Relative: 30.8 % (ref 12.0–46.0)
Lymphs Abs: 1.9 10*3/uL (ref 0.7–4.0)
MCHC: 31.7 g/dL (ref 30.0–36.0)
MCV: 83.3 fL (ref 78.0–100.0)
Monocytes Absolute: 0.6 10*3/uL (ref 0.1–1.0)
Monocytes Relative: 10.1 % (ref 3.0–12.0)
Neutro Abs: 3.2 10*3/uL (ref 1.4–7.7)
Neutrophils Relative %: 52.2 % (ref 43.0–77.0)
Platelets: 216 10*3/uL (ref 150.0–400.0)
RBC: 4.53 Mil/uL (ref 3.87–5.11)
RDW: 18.7 % — ABNORMAL HIGH (ref 11.5–15.5)
WBC: 6.1 10*3/uL (ref 4.0–10.5)

## 2023-05-15 LAB — B12 AND FOLATE PANEL
Folate: >25.2 ng/mL (ref >5.9)
Vitamin B-12: 223 pg/mL (ref 211–911)

## 2023-05-15 LAB — IBC + FERRITIN
Ferritin: 20.8 ng/mL (ref 10.0–291.0)
Iron: 157 ug/dL — ABNORMAL HIGH (ref 42–145)
Saturation Ratios: 39.5 % (ref 20.0–50.0)
TIBC: 397.6 ug/dL (ref 250.0–450.0)
Transferrin: 284 mg/dL (ref 212.0–360.0)

## 2023-05-18 ENCOUNTER — Other Ambulatory Visit: Payer: Self-pay

## 2023-05-18 DIAGNOSIS — D509 Iron deficiency anemia, unspecified: Secondary | ICD-10-CM

## 2023-05-21 ENCOUNTER — Encounter: Payer: Self-pay | Admitting: Family Medicine

## 2023-05-21 ENCOUNTER — Ambulatory Visit (INDEPENDENT_AMBULATORY_CARE_PROVIDER_SITE_OTHER): Payer: Medicare HMO | Admitting: Family Medicine

## 2023-05-21 DIAGNOSIS — Z0000 Encounter for general adult medical examination without abnormal findings: Secondary | ICD-10-CM | POA: Diagnosis not present

## 2023-05-21 DIAGNOSIS — E538 Deficiency of other specified B group vitamins: Secondary | ICD-10-CM | POA: Diagnosis not present

## 2023-05-21 MED ORDER — WEGOVY 0.5 MG/0.5ML ~~LOC~~ SOAJ: 0.5000 mg | SUBCUTANEOUS | 1 refills | Status: DC

## 2023-05-21 MED ORDER — SHINGRIX 50 MCG/0.5ML IM SUSR: INTRAMUSCULAR | 1 refills | Status: DC

## 2023-05-21 NOTE — Patient Instructions (Signed)
It was great to see you again today.  Sent in higher dose of wegovy - the 0.5mg  weekly.  Take shingles vaccine prescription to your pharmacy   Call GI office about setting up follow up colonoscopy  Follow up with me in 6 weeks, sooner if needed  Be well, Dr. Pollie Meyer

## 2023-05-21 NOTE — Progress Notes (Signed)
  Date of Visit: 05/21/2023   SUBJECTIVE:   HPI:  [name] presents today for follow up.  Obesity - has been taking wegovy 0.25mg  weekly. Tolerating well without side effects. Had lost some weight but then regained; she thinks it is related to stressful period over last couple of weeks during which her son was in ICU with pneumonia and severe hyperglycemia.  Folic acid deficiency - has been taking folic acid supplement and notes she feels much better. Had b12 levels drawn again by GI NP who again noted they were lower end of normal and recommended she follow up with PCP about this.   OBJECTIVE:   BP 122/75   Pulse 81   Ht 5' 5.5" (1.664 m)   Wt (!) 366 lb 9.6 oz (166.3 kg)   LMP 10/16/2013 (Approximate)   SpO2 100%   BMI 60.08 kg/m  Gen: no acute distress pleasant cooperative HEENT: normocephalic, atraumatic  Heart: regular rate and rhythm, no murmur Lungs: clear to auscultation bilaterally, normal work of breathing  Neuro: alert grossly nonfocal, speech normal  ASSESSMENT/PLAN:   Assessment & Plan Morbid obesity (HCC) Tolerating wegovy 0.25mg ; with some improvement in weight Will increase to 0.5mg  weekly Follow up with me in 6 weeks to see how she is doing Routine adult health maintenance Advised to call GI office as will soon be due for next colonoscopy Provided rx for shingrix to get at her pharmacy Folic acid deficiency Previously had low normal B12 level Subsequent labs showed normal MMA and elevated homocysteine > consistent with folic acid deficiency Doing well with folic acid supplementation With normal MMA no need for B12 repletion right now. Hgb improved with folate supp and iron repletion   FOLLOW UP: Follow up in 6 weeks for obesity  [place] J. Pollie Meyer, MD [facility]

## 2023-05-23 DIAGNOSIS — E538 Deficiency of other specified B group vitamins: Secondary | ICD-10-CM | POA: Insufficient documentation

## 2023-05-23 NOTE — Assessment & Plan Note (Signed)
Tolerating wegovy 0.25mg ; with some improvement in weight Will increase to 0.5mg  weekly Follow up with me in 6 weeks to see how she is doing

## 2023-05-23 NOTE — Assessment & Plan Note (Signed)
Previously had low normal B12 level Subsequent labs showed normal MMA and elevated homocysteine > consistent with folic acid deficiency Doing well with folic acid supplementation With normal MMA no need for B12 repletion right now. Hgb improved with folate supp and iron repletion

## 2023-05-23 NOTE — Assessment & Plan Note (Signed)
Advised to call GI office as will soon be due for next colonoscopy Provided rx for shingrix to get at her pharmacy

## 2023-06-02 ENCOUNTER — Ambulatory Visit
Admission: RE | Admit: 2023-06-02 | Discharge: 2023-06-02 | Disposition: A | Discharge status: Home / Self Care | Payer: Medicare HMO | Source: Ambulatory Visit | Attending: Family Medicine | Admitting: Family Medicine

## 2023-06-02 ENCOUNTER — Ambulatory Visit (INDEPENDENT_AMBULATORY_CARE_PROVIDER_SITE_OTHER): Payer: Medicare HMO

## 2023-06-02 VITALS — BP 142/80 | HR 79 | Ht 65.0 in | Wt 373.4 lb

## 2023-06-02 DIAGNOSIS — M25561 Pain in right knee: Secondary | ICD-10-CM

## 2023-06-02 DIAGNOSIS — M1711 Unilateral primary osteoarthritis, right knee: Secondary | ICD-10-CM | POA: Diagnosis not present

## 2023-06-02 NOTE — Progress Notes (Signed)
    SUBJECTIVE:   CHIEF COMPLAINT / HPI: [name] last week. Tripped over husband's shoes. Light was dark. Pain is primarily in the right knee. Hurts with stepping down. Radiates upward.  Is sharp. Hit right knee on cabinet when she braced herself. Able to get up on own. Able to walk on own afterwards. Has used "muscle rub" and asper creme. Has done more walking the past week because her son was in the hospital.  PERTINENT  PMH / PSH: HTN, CHF, OSA, GERD, Hx of Respiratory failure, Ovarian cancer, CKD  OBJECTIVE:   BP (!) 142/80   Pulse 79   Ht 5\' 5"  (1.651 m)   Wt (!) 373 lb 6 oz (169.4 kg)   LMP 10/16/2013 (Approximate)   SpO2 100%   BMI 62.13 kg/m   General: NAD, well appearing Neuro: A&O Respiratory: normal WOB on RA Extremities: Moving all 4 extremities equally  Right knee Inspection: Morbidly obese knee, bony landmarks nearly completely obscured due to excess fatty tissue. No obvious deformity, erythema, swelling, ecchymoses. Palpation: NTTP at medial joint line, vastus medialis ROM: full ROM flexion and extension. Special Tests: negative mcmurray, negative valgus and varus stress test, body habitus limiting effusion exam Strength: 5/5 knee flexion and extension    ASSESSMENT/PLAN:   Assessment & Plan Acute pain of right knee Suspect osteoarthritic flare. Unable to assess for effusion on exam. No clear mechanism of meniscal or ligamentous injury, and patient reassuringly has been ambulatory. -RICE -Tylenol prn for pain -Complete knee x ray, weightbearing and sunrise views -Pending x rays, referral to sports for US guided knee CSI -Physical therapy referral  Return in about 2 months (around 07/31/2023) for PCP f/u.  Celine Mans, MD [facility]

## 2023-06-02 NOTE — Patient Instructions (Addendum)
 It was great to see you! Thank you for allowing me to participate in your care!  Our plans for today:  - Ice as needed for pain - You may take Tylenol to help. - Elevating your knee may help as well. - I will let you know the results of your x rays. - Let me know if you would like a referral to physical therapy.   Please arrive 15 minutes PRIOR to your next scheduled appointment time! If you do not, this affects OTHER patients' care.  Take care and seek immediate care sooner if you develop any concerns.   Celine Mans, MD, PGY-2 [facility] 10:57 AM 06/02/2023  [facility]

## 2023-06-02 NOTE — Assessment & Plan Note (Addendum)
 Suspect osteoarthritic flare. Unable to assess for effusion on exam. No clear mechanism of meniscal or ligamentous injury, and patient reassuringly has been ambulatory. -RICE -Tylenol prn for pain -Complete knee x ray, weightbearing and sunrise views -Pending x rays, referral to sports for US guided knee CSI -Physical therapy referral

## 2023-06-03 ENCOUNTER — Telehealth: Payer: Self-pay

## 2023-06-03 DIAGNOSIS — M1711 Unilateral primary osteoarthritis, right knee: Secondary | ICD-10-CM

## 2023-06-03 NOTE — Telephone Encounter (Signed)
 Called patient regarding Knee XR results. Personally reviewed, showing medial compartment OA, no signs of fracture. Discussed with patient. Will refer to Sports Medicine for Ultrasound guided CSI. Answered all questions.

## 2023-06-03 NOTE — Telephone Encounter (Signed)
 Patient calls nurse line requesting xray results and plan.   Will forward to provider who saw patient.

## 2023-06-04 ENCOUNTER — Other Ambulatory Visit: Payer: Self-pay

## 2023-06-04 ENCOUNTER — Ambulatory Visit (INDEPENDENT_AMBULATORY_CARE_PROVIDER_SITE_OTHER): Payer: Medicare HMO | Admitting: Family Medicine

## 2023-06-04 ENCOUNTER — Encounter: Payer: Self-pay | Admitting: Family Medicine

## 2023-06-04 VITALS — BP 143/65 | Ht 65.5 in | Wt 367.0 lb

## 2023-06-04 DIAGNOSIS — Z86711 Personal history of pulmonary embolism: Secondary | ICD-10-CM

## 2023-06-04 DIAGNOSIS — Z7901 Long term (current) use of anticoagulants: Secondary | ICD-10-CM | POA: Diagnosis not present

## 2023-06-04 DIAGNOSIS — M1711 Unilateral primary osteoarthritis, right knee: Secondary | ICD-10-CM

## 2023-06-04 MED ORDER — METHYLPREDNISOLONE ACETATE 40 MG/ML IJ SUSP
40.0000 mg | Freq: Once | INTRAMUSCULAR | Status: AC
  Administered 2023-06-04: 40 mg via INTRA_ARTICULAR

## 2023-06-04 NOTE — Assessment & Plan Note (Signed)
 BMI 60  Plan: -Morbid obesity is also contributing to knee pain and osteoarthritis.  Has already been started on Wegovy to help with weight loss.  She should continue with this -The patient able to lose weight may be candidate for knee replacement in the future

## 2023-06-04 NOTE — Patient Instructions (Signed)

## 2023-06-04 NOTE — Assessment & Plan Note (Signed)
 On chronic anticoagulation with Xarelto following pulmonary embolism related to cancer  Plan: -Only take oral NSAIDs due to anticoagulation.  Can continue Tylenol as needed -Continue Xarelto as directed -Follow-up with PCP as directed

## 2023-06-04 NOTE — Progress Notes (Signed)
 DATE OF VISIT: 06/04/2023        [name] DOB: 09-18-1965 MRN: [id number]  CC:  Rt knee pain  History- [name] is a 58 y.o. female for evaluation and treatment of Rt knee pain Seen by [facility] - Dr Velna Ochs on 06/02/23 and referred for consideration of Rt knee CSI Review of visit note shows: - fall last week - tripped over husband's shoe - hit knee on a cabinet when bracing herself - pain with stepping down - able to walk - sent for XR showing advanced knee OA  Today she reports ongoing pain in the right knee Longstanding history of knee pain Has had prior knee injections -Review of chart shows that she previously had cortisone injections August 2019, May 2017, August 04, 2013 -She has been told in the past that she may require a knee replacement, but needed to lose weight Has continued to have intermittent pain, but was significantly worsened by her recent fall Denies any significant swelling or bruising Has been using a single pod cane to help her ambulate She is using Tylenol as needed, cannot take NSAIDs due to chronic anticoagulation with Xarelto due to history of VTE She has started [facility] for weight loss and is doing well thus far  Past Medical History Past Medical History:  Diagnosis Date   Allergy    Anemia    Anxiety    Arthritis    Blood transfusion without reported diagnosis    Cough    Cough 06/11/2018   Difficulty sleeping 06/11/2018   Diverticulitis with perforation 2010   DVT (deep vein thrombosis) in pregnancy 11/24/2016   DVT, lower extremity (HCC)    Endometrial cancer (HCC)    Enlarged lymph nodes 08/13/2017   Family history of adverse reaction to anesthesia    pts daughter had difficulty awakening following anesthesia, long time to wake up   GERD (gastroesophageal reflux disease)    Headache    Hemoptysis 04/22/2018   History of bronchitis    History of ear infections    Hospital discharge follow-up 03/02/2019   Hx of migraines     Hyperlipidemia    Hypertension    IBS (irritable bowel syndrome)    Kidney infection    Lupus (systemic lupus erythematosus) (HCC) 02/2017   Morbid obesity (HCC)    Neck pain 01/13/2019   Ovarian cancer (HCC) dx'd 01/2015   PONV (postoperative nausea and vomiting)    Port-A-Cath in place    Right side   Portacath in place 09/12/2015   Pre-diabetes    Pyelonephritis    Right knee pain 11/28/2011   Scleroderma (HCC)    Screen for colon cancer 03/02/2019   Slow rate of speech 08/22/2015   Chronic from CVA.  She also has loss of left nasolabial fold and slight left facial droop/small asymmetry on the left side secondary to CVA   UTI (urinary tract infection) 05/07/2018    Past Surgical History Past Surgical History:  Procedure Laterality Date   ABDOMINAL HYSTERECTOMY N/A 03/13/2015   Procedure: TOTAL HYSTERECTOMY ABDOMINAL BILATERAL SALPINGO OOPHORECTOMY RADICAL TUMOR DEBUKING;  Surgeon: Adolphus Birchwood, MD;  Location: [facility];  Service: Gynecology;  Laterality: N/A;   BOWEL RESECTION  03/13/2015   Procedure: SMALL BOWEL RESECTION;  Surgeon: Adolphus Birchwood, MD;  Location: [facility];  Service: Gynecology;;   CESAREAN SECTION  1983 and 1984   COLONOSCOPY WITH PROPOFOL N/A 07/19/2020   Procedure: COLONOSCOPY WITH PROPOFOL;  Surgeon: Sherrilyn Rist, MD;  Location: [facility];  Service: Gastroenterology;  Laterality: N/A;   COLOSTOMY  04/07/2006   COLOSTOMY TAKEDOWN     ESOPHAGOGASTRODUODENOSCOPY (EGD) WITH PROPOFOL N/A 12/21/2018   Procedure: ESOPHAGOGASTRODUODENOSCOPY (EGD) WITH PROPOFOL;  Surgeon: Sherrilyn Rist, MD;  Location: [facility];  Service: Gastroenterology;  Laterality: N/A;   HEMOSTASIS CLIP PLACEMENT  07/19/2020   Procedure: HEMOSTASIS CLIP PLACEMENT;  Surgeon: Sherrilyn Rist, MD;  Location: [facility];  Service: Gastroenterology;;   IR REMOVAL TUN ACCESS W/ PORT W/O FL MOD SED  12/17/2022   LAPAROTOMY N/A 03/13/2015   Procedure: EXPLORATORY LAPAROTOMY;  Surgeon: Adolphus Birchwood, MD;  Location: [facility];  Service: Gynecology;  Laterality: N/A;   POLYPECTOMY  07/19/2020   Procedure: POLYPECTOMY;  Surgeon: Sherrilyn Rist, MD;  Location: [facility];  Service: Gastroenterology;;   TONSILLECTOMY AND ADENOIDECTOMY  04/08/1995   VENTRAL HERNIA REPAIR  03/13/2015   Procedure: HERNIA REPAIR VENTRAL ADULT;  Surgeon: Adolphus Birchwood, MD;  Location: [facility];  Service: Gynecology;;    Medications Current Outpatient Medications  Medication Sig Dispense Refill   acetaminophen (TYLENOL) 500 MG tablet Take 2 tablets (1,000 mg total) by mouth every 6 (six) hours as needed for moderate pain. 30 tablet 0   albuterol (PROVENTIL) (2.5 MG/3ML) 0.083% nebulizer solution Take 3 mLs (2.5 mg total) by nebulization every 6 (six) hours as needed for wheezing or shortness of breath. 150 mL 1   albuterol (VENTOLIN HFA) 108 (90 Base) MCG/ACT inhaler INHALE 2 PUFFS INTO THE LUNGS EVERY 6 HOURS AS NEEDED FOR WHEEZING OR SHORTNESS OF BREATH 18 g 0   amLODipine-olmesartan (AZOR) 5-20 MG tablet Take 1 tablet by mouth daily. 90 tablet 3   baclofen (LIORESAL) 10 MG tablet Take 1 tablet (10 mg total) by mouth 2 (two) times daily as needed for muscle spasms. 30 tablet 0   benzonatate (TESSALON) 100 MG capsule Take 1 capsule (100 mg total) by mouth 3 (three) times daily as needed for cough. 30 capsule 0   cetirizine (ZYRTEC) 10 MG tablet Take 10 mg by mouth daily.     Cholecalciferol (VITAMIN D) 50 MCG (2000 UT) CAPS Take 2,000 Units by mouth daily.     diclofenac Sodium (VOLTAREN) 1 % GEL Apply to knee once daily as needed (Patient taking differently: Apply 4 g topically daily.) 100 g 1   ezetimibe (ZETIA) 10 MG tablet Take 1 tablet (10 mg total) by mouth daily. 90 tablet 3   ferrous sulfate 325 (65 FE) MG EC tablet Take 1 tablet (325 mg total) by mouth daily with breakfast. (Patient taking differently: Take 325 mg by mouth every other day.) 30 tablet 2   fesoterodine (TOVIAZ) 8 MG TB24 tablet Take 1  tablet (8 mg total) by mouth daily. 90 tablet 2   fluticasone (FLONASE) 50 MCG/ACT nasal spray Place 2 sprays into both nostrils in the morning and at bedtime. 18.2 mL 5   folic acid (FOLVITE) 1 MG tablet Take 1 tablet (1 mg total) by mouth daily. 90 tablet 3   furosemide (LASIX) 20 MG tablet Take 1 tablet (20 mg total) by mouth daily as needed for edema or fluid. 30 tablet 0   naltrexone (DEPADE) 50 MG tablet Take 25 mg by mouth daily.     omeprazole (PRILOSEC) 40 MG capsule TAKE 1 CAPSULE(40 MG) BY MOUTH IN THE MORNING AND AT BEDTIME 60 capsule 1   ondansetron (ZOFRAN) 4 MG tablet Take 1 tablet (4 mg total) by mouth every  6 (six) hours as needed for nausea. 20 tablet 0   OXYGEN Inhale into the lungs.     Semaglutide-Weight Management (WEGOVY) 0.5 MG/0.5ML SOAJ Inject 0.5 mg into the skin once a week. 2 mL 1   Tiotropium Bromide-Olodaterol (STIOLTO RESPIMAT) 2.5-2.5 MCG/ACT AERS Inhale 2 puffs into the lungs daily. 1 each 11   triamcinolone cream (KENALOG) 0.1 % Apply 1 Application topically 2 (two) times daily as needed. APPLY TO THE AFFECTED AREA TWICE DAILY AS NEEDED FOR SKIN IRRITATION (Patient taking differently: Apply 1 Application topically 2 (two) times daily as needed (Skin irritation.).) 45 g 0   valACYclovir (VALTREX) 500 MG tablet TAKE 1 TABLET(500 MG) BY MOUTH TWICE DAILY FOR 3 DAYS AT START OF OUTBREAK 6 tablet 2   XARELTO 20 MG TABS tablet TAKE 1 TABLET(20 MG) BY MOUTH IN THE MORNING 90 tablet 1   Zoster Vaccine Adjuvanted [facility]) injection Administer Shingrix vaccination now and repeat in two months 1 each 1   No current facility-administered medications for this visit.    Allergies is allergic to tape and penicillins.  Family History - reviewed per EMR and intake form  Social History   reports current alcohol use.  reports that she has never smoked. She has never used smokeless tobacco.  reports no history of drug use.   EXAM: Vitals: BP (!) 143/65   Ht 5' 5.5"  (1.664 m)   Wt (!) 367 lb (166.5 kg)   LMP 10/16/2013 (Approximate)   BMI 60.14 kg/m  General: AOx3, NAD, pleasant, morbidly obese SKIN: no rashes or lesions, skin clean, dry, intact MSK: Knee: Right knee without swelling or effusion.  Full range of motion without pain.  She is tender palpation along the medial joint line.  No lateral joint line tenderness.  No ligamentous laxity.  Some pain with [place]. Left knee with good range of motion without pain Walking with antalgic gait with the assistance of a single pod cane  NEURO: sensation intact to light touch lower bilaterally VASC: No calf tenderness, trace lower extremity edema  IMAGING: XRAYS:  Right knee x-ray 4 views AP, lateral, sunrise, tunnel 06/02/2023 personally reviewed and interpreted by me today showing: -Significant medial joint space narrowing with near bone-on-bone opposition and tunnel view. -Mild narrowing of the lateral compartment and patellofemoral compartment -Findings consistent with moderate to severe osteoarthritis  Assessment & Plan Primary osteoarthritis of right knee Acute on chronic right knee pain with advanced osteoarthritis, near bone-on-bone in the medial compartment.  Symptoms exacerbated by recent fall -Patient unable to take oral NSAIDs due to chronic anticoagulation with Xarelto for history of pulmonary embolism in setting of prior cancer  Plan: -Prior visit notes with PCP office 06/02/2023 reviewed and as noted in HPI -Right knee x-ray reviewed as noted above showing significant osteoarthritis -Options discussed.  She is candidate for ultrasound-guided cortisone injection today.  She would like to proceed with this.  This was completed.  Please see procedure note below.  PROCEDURE:  Risks & benefits of Rt knee ultrasound guided cortisone injection reviewed.  Consent obtained.  Time-out completed.  Patient prepped and draped in the normal fashion. Musculoskeletal ultrasound used to identify  appropriate anatomy.  Patient noted to have no effusion, no other abnormalities.  After identifying appropriate anatomy, patient positioned & area cleansed with chlorhexadine.  Ethyl chloride spray used to anesthetize the skin.  Solution of 3 mL 1% lidocaine injected into the superior-lateral aspect of the RT knee for local anesthesia under ultrasound guidance.  After ensuring adequate anesthesia solution of 4 mL 1% lidocaine with 1 mL Depo-medrol 40mg /mL then injected in the RT knee under ultrasound guidance using 22-g 3.5-inch spinal needle.  Needle well visualized in the right knee joint.  Images saved.  Patient tolerated procedure well without any complications.  Area covered with adhesive bandage.  Post-procedure care reviewed.  All questions answered.   -Continue Tylenol as needed -Continue ice as needed -Continue to use cane as needed -Continued weight loss with the assistance of Wegovy will help improve pressure on the knee. -Patient aware that definitive treatment in the future would be a knee replacement -Follow-up 4 to 6 weeks if no improvement, could consider HA injections if symptoms persist Morbid obesity (HCC) BMI 60  Plan: -Morbid obesity is also contributing to knee pain and osteoarthritis.  Has already been started on Wegovy to help with weight loss.  She should continue with this -The patient able to lose weight may be candidate for knee replacement in the future Chronic anticoagulation On chronic anticoagulation with Xarelto following pulmonary embolism related to cancer  Plan: -Only take oral NSAIDs due to anticoagulation.  Can continue Tylenol as needed -Continue Xarelto as directed -Follow-up with PCP as directed History of pulmonary embolism On chronic anticoagulation with Xarelto following pulmonary embolism related to cancer  Plan: -Only take oral NSAIDs due to anticoagulation.  Can continue Tylenol as needed -Continue Xarelto as directed -Follow-up with PCP as  directed   Patient expressed understanding & agreement with above.  Encounter Diagnoses  Name Primary?   Primary osteoarthritis of right knee Yes   Morbid obesity (HCC)    Chronic anticoagulation    History of pulmonary embolism     Orders Placed This Encounter  Procedures   [place] LIMITED JOINT SPACE STRUCTURES LOW RIGHT   Ambulatory referral to Physical Therapy    Orders Placed This Encounter  Procedures   [place] LIMITED JOINT SPACE STRUCTURES LOW RIGHT   Ambulatory referral to Physical Therapy

## 2023-06-04 NOTE — Assessment & Plan Note (Signed)
 Acute on chronic right knee pain with advanced osteoarthritis, near bone-on-bone in the medial compartment.  Symptoms exacerbated by recent fall -Patient unable to take oral NSAIDs due to chronic anticoagulation with Xarelto for history of pulmonary embolism in setting of prior cancer  Plan: -Prior visit notes with PCP office 06/02/2023 reviewed and as noted in HPI -Right knee x-ray reviewed as noted above showing significant osteoarthritis -Options discussed.  She is candidate for ultrasound-guided cortisone injection today.  She would like to proceed with this.  This was completed.  Please see procedure note below.  PROCEDURE:  Risks & benefits of Rt knee ultrasound guided cortisone injection reviewed.  Consent obtained.  Time-out completed.  Patient prepped and draped in the normal fashion. Musculoskeletal ultrasound used to identify appropriate anatomy.  Patient noted to have no effusion, no other abnormalities.  After identifying appropriate anatomy, patient positioned & area cleansed with chlorhexadine.  Ethyl chloride spray used to anesthetize the skin.  Solution of 3 mL 1% lidocaine injected into the superior-lateral aspect of the RT knee for local anesthesia under ultrasound guidance.  After ensuring adequate anesthesia solution of 4 mL 1% lidocaine with 1 mL Depo-medrol 40mg /mL then injected in the RT knee under ultrasound guidance using 22-g 3.5-inch spinal needle.  Needle well visualized in the right knee joint.  Images saved.  Patient tolerated procedure well without any complications.  Area covered with adhesive bandage.  Post-procedure care reviewed.  All questions answered.   -Continue Tylenol as needed -Continue ice as needed -Continue to use cane as needed -Continued weight loss with the assistance of Wegovy will help improve pressure on the knee. -Patient aware that definitive treatment in the future would be a knee replacement -Follow-up 4 to 6 weeks if no improvement, could  consider HA injections if symptoms persist

## 2023-07-10 ENCOUNTER — Other Ambulatory Visit: Payer: Self-pay

## 2023-07-10 ENCOUNTER — Other Ambulatory Visit: Payer: Self-pay | Admitting: Nurse Practitioner

## 2023-07-10 ENCOUNTER — Encounter (HOSPITAL_BASED_OUTPATIENT_CLINIC_OR_DEPARTMENT_OTHER): Payer: Self-pay | Admitting: Physical Therapy

## 2023-07-10 ENCOUNTER — Ambulatory Visit (HOSPITAL_BASED_OUTPATIENT_CLINIC_OR_DEPARTMENT_OTHER): Payer: Medicare HMO | Attending: Family Medicine | Admitting: Physical Therapy

## 2023-07-10 DIAGNOSIS — M1711 Unilateral primary osteoarthritis, right knee: Secondary | ICD-10-CM | POA: Insufficient documentation

## 2023-07-10 DIAGNOSIS — R2689 Other abnormalities of gait and mobility: Secondary | ICD-10-CM | POA: Diagnosis not present

## 2023-07-10 DIAGNOSIS — M25561 Pain in right knee: Secondary | ICD-10-CM | POA: Insufficient documentation

## 2023-07-10 DIAGNOSIS — M6281 Muscle weakness (generalized): Secondary | ICD-10-CM | POA: Diagnosis not present

## 2023-07-10 DIAGNOSIS — G8929 Other chronic pain: Secondary | ICD-10-CM | POA: Insufficient documentation

## 2023-07-10 NOTE — Therapy (Signed)
 OUTPATIENT PHYSICAL THERAPY LOWER EXTREMITY EVALUATION   Patient Name: [name] MRN: [id number] DOB:Oct 22, 1965, 58 y.o., female Today's Date: 07/10/2023  END OF SESSION:  PT End of Session - 07/10/23 1028     Visit Number 1    Number of Visits 12    Date for PT Re-Evaluation 09/04/23    Authorization Type [employer] m[employer]    PT Start Time 0932    PT Stop Time 1010    PT Time Calculation (min) 38 min    Activity Tolerance Patient tolerated treatment well    Behavior During Therapy WFL for tasks assessed/performed             Past Medical History:  Diagnosis Date   Allergy    Anemia    Anxiety    Arthritis    Blood transfusion without reported diagnosis    Cough    Cough 06/11/2018   Difficulty sleeping 06/11/2018   Diverticulitis with perforation 2010   DVT (deep vein thrombosis) in pregnancy 11/24/2016   DVT, lower extremity (HCC)    Endometrial cancer (HCC)    Enlarged lymph nodes 08/13/2017   Family history of adverse reaction to anesthesia    pts daughter had difficulty awakening following anesthesia, long time to wake up   GERD (gastroesophageal reflux disease)    Headache    Hemoptysis 04/22/2018   History of bronchitis    History of ear infections    Hospital discharge follow-up 03/02/2019   Hx of migraines    Hyperlipidemia    Hypertension    IBS (irritable bowel syndrome)    Kidney infection    Lupus (systemic lupus erythematosus) (HCC) 02/2017   Morbid obesity (HCC)    Neck pain 01/13/2019   Ovarian cancer (HCC) dx'd 01/2015   PONV (postoperative nausea and vomiting)    Port-A-Cath in place    Right side   Portacath in place 09/12/2015   Pre-diabetes    Pyelonephritis    Right knee pain 11/28/2011   Scleroderma (HCC)    Screen for colon cancer 03/02/2019   Slow rate of speech 08/22/2015   Chronic from CVA.  She also has loss of left nasolabial fold and slight left facial droop/small asymmetry on the left side secondary to CVA   UTI (urinary tract  infection) 05/07/2018   Past Surgical History:  Procedure Laterality Date   ABDOMINAL HYSTERECTOMY N/A 03/13/2015   Procedure: TOTAL HYSTERECTOMY ABDOMINAL BILATERAL SALPINGO OOPHORECTOMY RADICAL TUMOR DEBUKING;  Surgeon: Adolphus Birchwood, MD;  Location: [facility];  Service: Gynecology;  Laterality: N/A;   BOWEL RESECTION  03/13/2015   Procedure: SMALL BOWEL RESECTION;  Surgeon: Adolphus Birchwood, MD;  Location: [facility];  Service: Gynecology;;   CESAREAN SECTION  1983 and 1984   COLONOSCOPY WITH PROPOFOL N/A 07/19/2020   Procedure: COLONOSCOPY WITH PROPOFOL;  Surgeon: Sherrilyn Rist, MD;  [facility]: [facility];  Service: Gastroenterology;  Laterality: N/A;   COLOSTOMY  04/07/2006   COLOSTOMY TAKEDOWN     ESOPHAGOGASTRODUODENOSCOPY (EGD) WITH PROPOFOL N/A 12/21/2018   Procedure: ESOPHAGOGASTRODUODENOSCOPY (EGD) WITH PROPOFOL;  Surgeon: Sherrilyn Rist, MD;  [facility];  Service: Gastroenterology;  Laterality: N/A;   HEMOSTASIS CLIP PLACEMENT  07/19/2020   Procedure: HEMOSTASIS CLIP PLACEMENT;  Surgeon: Sherrilyn Rist, MD;  [facility];  Service: Gastroenterology;;   IR REMOVAL TUN ACCESS W/ PORT W/O FL MOD SED  12/17/2022   LAPAROTOMY N/A 03/13/2015   Procedure: EXPLORATORY LAPAROTOMY;  Surgeon: Adolphus Birchwood, MD;  Location: [facility];  Service: Gynecology;  Laterality: N/A;   POLYPECTOMY  07/19/2020   Procedure: POLYPECTOMY;  Surgeon: Sherrilyn Rist, MD;  [facility];  Service: Gastroenterology;;   TONSILLECTOMY AND ADENOIDECTOMY  04/08/1995   VENTRAL HERNIA REPAIR  03/13/2015   Procedure: HERNIA REPAIR VENTRAL ADULT;  Surgeon: Adolphus Birchwood, MD;  Location: [facility];  Service: Gynecology;;   Patient Active Problem List   Diagnosis Date Noted   Primary osteoarthritis of right knee 06/04/2023   Folic acid deficiency 05/23/2023   Routine adult health maintenance 02/07/2023   Congestion of upper airway 07/22/2022   Recurrent pneumonia 07/08/2022   Acute respiratory  failure with hypoxia (HCC) 06/25/2022   Aspiration into respiratory tract 06/25/2022   Injury of right foot 06/12/2022   Right knee pain 05/26/2022   Multifocal pneumonia 11/19/2021   Acute recurrent maxillary sinusitis 10/31/2021   Viral illness 06/05/2021   Chronic diastolic CHF (congestive heart failure) (HCC) 07/19/2020   Bronchospasm 07/19/2020   Incontinence 03/17/2020   Herpes simplex virus (HSV) infection of buttock 01/05/2020   OSA (obstructive sleep apnea) 07/25/2019   Prediabetes 07/25/2019   Lateral epicondylitis of left elbow 01/13/2019   Normocytic anemia 05/29/2018   Scleroderma (HCC) 04/22/2018   Bloody sputum 04/22/2018   Swelling of foot joint, right 04/08/2018   Positive ANA (antinuclear antibody) 09/23/2016   Eczema 01/27/2016   History of pulmonary embolism 09/12/2015   Chronic anticoagulation 09/06/2015   Chemotherapy-induced peripheral neuropathy (HCC) 06/13/2015   CKD (chronic kidney disease) stage 3, GFR 30-59 ml/min (HCC) 05/12/2015   GERD (gastroesophageal reflux disease) 05/12/2015   Ovarian carcinosarcoma, 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   Endometrial ca (HCC) 03/29/2015   Ventral hernia without obstruction or gangrene    Morbid obesity (HCC) 02/19/2015   Essential hypertension, benign    Stress at home 12/14/2014   Left knee pain 08/04/2013   Seasonal allergies 07/29/2013    PCP: Levert Feinstein MD  REFERRING PROVIDER: Andi Devon, DO   REFERRING DIAG: Primary osteoarthritis of right knee   THERAPY DIAG:  Chronic pain of right knee  Muscle weakness (generalized)  Other abnormalities of gait and mobility  Rationale for Evaluation and Treatment: Rehabilitation  ONSET DATE: exacerbated x 2 months  SUBJECTIVE:   SUBJECTIVE STATEMENT: Cortizone injections x 1 month ago with little relief. Symptoms x >5 yrs. Neuropathies bilat feet due to chemo. Membership here at [facility]. Had 6 episodes of aspiration pneum last year, on wagovia for weight  loss to potentially repair a hernia.  On bl thinner.  Hoping to have knee replacements when BMI 40.  PERTINENT HISTORY: Evaluate and treat for right knee bone on bone osteoarthritis. Please include aquatic therapy in treatment plan. PAIN:  Are you having pain? Yes: NPRS scale: current 5/10; worst 8/10; least 0-1/10 Pain location: r knee Pain description: dull ache/pain Aggravating factors: walking Relieving factors: resting, non-WB  PRECAUTIONS: None  RED FLAGS: None   WEIGHT BEARING RESTRICTIONS: No  FALLS:  Has patient fallen in last 6 months? Yes. Number of falls 1 tripped over husbands shoe  LIVING ENVIRONMENT: Lives with: lives with their family Lives in: House/apartment Stairs:  2 step ith handrail Has following equipment at home: Single point cane  OCCUPATION: disability  PLOF: Independent  PATIENT GOALS: decreased pain, lose weight  NEXT MD VISIT: end of month  OBJECTIVE:  Note: Objective measures were completed at Evaluation unless otherwise noted.  DIAGNOSTIC FINDINGS: RIGHT KNEE - COMPLETE 4+ VIEW   COMPARISON:  July 27, 2013   FINDINGS: Osteoarthrosis with narrowing of the medial compartment with marginal osteophytes. Femoropatellar osteoarthrosis. Mild narrowing of the lateral and medial compartments of the patella. No fractures   IMPRESSION: Osteoarthrosis.  PATIENT SURVEYS:  LEFS 26/80  COGNITION: Overall cognitive status: Within functional limits for tasks assessed     SENSATION: WFL  EDEMA:  Unable to define due to body habitus  MUSCLE LENGTH: Hamstrings: slight tightness   PALPATION: TTP medial right joint line knee  LOWER EXTREMITY strength:  Strength (lbs) Right eval Left eval  Hip flexion 43.5 40.7  Hip extension    Hip abduction 28.1 34.3  Hip adduction    Hip internal rotation    Hip external rotation    Knee flexion 45.7 47.0  Knee extension    Ankle dorsiflexion    Ankle plantarflexion    Ankle inversion     Ankle eversion     (Blank rows = not tested)  LOWER EXTREMITY MMT:  ROM Right eval Left eval  Hip flexion    Hip extension    Hip abduction    Hip adduction    Hip internal rotation    Hip external rotation    Knee flexion 100 112  Knee extension    Ankle dorsiflexion    Ankle plantarflexion    Ankle inversion    Ankle eversion     (Blank rows = not tested) Difficult to assess due to body habitus   FUNCTIONAL TESTS:  5 times sit to stand: 14.42 pool bench Timed up and go (TUG): 13.87 4 stage: passed  GAIT: Distance walked: 500 ft Assistive device utilized: Single point cane Level of assistance: Complete Independence Comments: wide BOS (due to body habitus), knee and hip flex limited just clearing feet from floor. Increased lateral displacement                                                                                                                                TREATMENT  Eval Self-care: past aquatic HEP; routine exercise program; BMI/nutrition,    PATIENT EDUCATION:  Education details: Discussed eval findings, rehab rationale, aquatic program progression/POC and pools in area. Patient is in agreement Person educated: Patient Education method: Explanation Education comprehension: verbalized understanding  HOME EXERCISE PROGRAM: Aquatic:Has assigned from last episode.  Will bring laminated copy with her next visit  ASSESSMENT:  CLINICAL IMPRESSION: Patient is a 58 y.o. f who was seen today for physical therapy evaluation and treatment for R knee oa. Pt is well known to this clinic.  She has had multiple episodes through other [facility] for various dysfunctions (LB and LE).  Is a member here at [employer].  Has had respiratory issues for past year decreasing her ability to exercises/come to gym.  She presents today with right knee pain due to OA.  Her ROM and strength reflect limitation as compared to left.  She is trying to decrease her BMI to  be a  candidate for TKR but has struggled with this for the past few years.  She will benefit from skilled PT to improve deficits and assign, encourage regular exercise program for pain and condition management. Plan on combination of land and aquatics for optimal outcome.    OBJECTIVE IMPAIRMENTS: Abnormal gait, decreased endurance, decreased mobility, difficulty walking, decreased ROM, decreased strength, obesity, and pain.   ACTIVITY LIMITATIONS: lifting, standing, squatting, sleeping, stairs, and locomotion level  PARTICIPATION LIMITATIONS: shopping, community activity, and occupation  PERSONAL FACTORS: Behavior pattern, Fitness, Past/current experiences, Time since onset of injury/illness/exacerbation, and 1-2 comorbidities: see pmhx   are also affecting patient's functional outcome.   REHAB POTENTIAL: Good  CLINICAL DECISION MAKING: Stable/uncomplicated  EVALUATION COMPLEXITY: Low   GOALS: Goals reviewed with patient? Yes  SHORT TERM GOALS: Target date: 4/25 Pt will tolerate full aquatic sessions consistently without increase in pain and with improving function to demonstrate good toleration and effectiveness of intervention.  Baseline: Goal status: INITIAL  2.  Pt will report complete reduction in right knee pain while submerged with reduction in pain for a few hours afterward. Baseline: 5/10 Goal status: INITIAL  3.  Pt will report return to regular exercise regimen. Baseline: none Goal status: INITIAL   LONG TERM GOALS: Target date: 09/03/23  Pt to improve on LEFS by at least 9 point to demonstrate statistically significant Improvement in function. Baseline:  Goal status: INITIAL  2.  Pt will be indep with final HEP's (land and aquatic as appropriate) for continued management of condition Baseline:  Goal status: INITIAL  3.  Pt will report decrease in pain by at least 50% for improved toleration to activity/quality of life and to demonstrate improved management of  pain. Baseline:  Goal status: INITIAL  4.  Pt will improve on 5 X STS test to <or= 11s  to demonstrate improving functional lower extremity strength, transitional movements, and balance Baseline: 14.42 Goal status: INITIAL  5.  Pt will improve on Tug test to <or=  11s to demonstrate improvement in lower extremity function, mobility and decreased fall risk. Baseline: 13.87 Goal status: INITIAL     PLAN:  PT FREQUENCY: 2x/week  PT DURATION: 8 weeks 12 visit (extended due to vacation)  PLANNED INTERVENTIONS: 40981- PT Re-evaluation, 97110-Therapeutic exercises, 97530- Therapeutic activity, 97112- Neuromuscular re-education, 97535- Self Care, 19147- Manual therapy, [phone]- Gait training, [phone]- Orthotic Fit/training, [phone]- Aquatic Therapy, [phone]- Ionotophoresis 4mg /ml Dexamethasone, Patient/Family education, Balance training, Stair training, Taping, Dry Needling, Joint mobilization, DME instructions, Cryotherapy, and Moist heat  PLAN FOR NEXT SESSION: aquatics: le strength; balance and proprioceptive retraining. Aquatic HEP Land: OA right knee strengthening /rom/stretching.  HEP   Corrie Dandy Whiting) Daijon Wenke MPT 07/10/23 10:32 AM [facility] [address] [place], [place], [address] Phone: [phone]   Fax:  [phone]  Referring diagnosis? Primary osteoarthritis of right knee  Treatment diagnosis? (if different than referring diagnosis) Primary osteoarthritis of right knee  What was this (referring dx) caused by? []  Surgery [x]  Fall [x]  Ongoing issue []  Arthritis []  Other: ____________  Laterality: [x]  Rt []  Lt []  Both  Check all possible CPT codes:  *CHOOSE 10 OR LESS*    See Planned Interventions listed in the Plan section of the Evaluation.

## 2023-07-14 ENCOUNTER — Other Ambulatory Visit: Payer: Self-pay

## 2023-07-14 DIAGNOSIS — M545 Low back pain, unspecified: Secondary | ICD-10-CM

## 2023-07-14 NOTE — Telephone Encounter (Signed)
 Patient calls nurse line requesting refill on Baclofen. She states that her "back went out on Sunday." She has an appointment with Sports Medicine on Thursday, 07/16/23. She wanted to see if PCP could prescribe her the Baclofen until she is able to see specialist.   Preferred Pharmacy is [facility] on [address].   Please advise.  Veronda Prude, RN

## 2023-07-15 MED ORDER — BACLOFEN 10 MG PO TABS: 10.0000 mg | ORAL_TABLET | Freq: Two times a day (BID) | ORAL | 0 refills | Status: DC | PRN

## 2023-07-15 NOTE — Telephone Encounter (Signed)
 Sending rx  Latrelle Dodrill, MD

## 2023-07-16 ENCOUNTER — Encounter: Payer: Self-pay | Admitting: Family Medicine

## 2023-07-16 ENCOUNTER — Ambulatory Visit (INDEPENDENT_AMBULATORY_CARE_PROVIDER_SITE_OTHER): Payer: Medicare HMO | Admitting: Family Medicine

## 2023-07-16 VITALS — BP 138/68 | Ht 65.5 in | Wt 363.0 lb

## 2023-07-16 DIAGNOSIS — M1711 Unilateral primary osteoarthritis, right knee: Secondary | ICD-10-CM

## 2023-07-16 DIAGNOSIS — M545 Low back pain, unspecified: Secondary | ICD-10-CM | POA: Diagnosis not present

## 2023-07-16 NOTE — Progress Notes (Signed)
 Subjective:   HPI: Patient is a 58 y.o. female here for acute right lower back pain and follow-up of right knee pain. Back pain began 4 days ago after rising from a chair, during which she heard a "pop" and experienced immediate difficulty moving. The pain is intermittent, achy, and dull, localized to the right lower back without radiation, numbness, or tingling. She has a history of back pain, though it's been inactive for some time. Tylenol and topical muscle rub provided minimal relief; baclofen, started yesterday, has been helpful. She is interested in pursuing physical therapy.  Right knee pain is gradually improving. She notes similar delayed relief after previous joint injections. Pain worsens with activity and improves with rest. She is scheduled to begin water therapy twice weekly starting tomorrow.   Past Medical History:  Diagnosis Date   Allergy    Anemia    Anxiety    Arthritis    Blood transfusion without reported diagnosis    Cough    Cough 06/11/2018   Difficulty sleeping 06/11/2018   Diverticulitis with perforation 2010   DVT (deep vein thrombosis) in pregnancy 11/24/2016   DVT, lower extremity (HCC)    Endometrial cancer (HCC)    Enlarged lymph nodes 08/13/2017   Family history of adverse reaction to anesthesia    pts daughter had difficulty awakening following anesthesia, long time to wake up   GERD (gastroesophageal reflux disease)    Headache    Hemoptysis 04/22/2018   History of bronchitis    History of ear infections    Hospital discharge follow-up 03/02/2019   Hx of migraines    Hyperlipidemia    Hypertension    IBS (irritable bowel syndrome)    Kidney infection    Lupus (systemic lupus erythematosus) (HCC) 02/2017   Morbid obesity (HCC)    Neck pain 01/13/2019   Ovarian cancer (HCC) dx'd 01/2015   PONV (postoperative nausea and vomiting)    Port-A-Cath in place    Right side   Portacath in place 09/12/2015   Pre-diabetes    Pyelonephritis    Right knee  pain 11/28/2011   Scleroderma (HCC)    Screen for colon cancer 03/02/2019   Slow rate of speech 08/22/2015   Chronic from CVA.  She also has loss of left nasolabial fold and slight left facial droop/small asymmetry on the left side secondary to CVA   UTI (urinary tract infection) 05/07/2018    Current Outpatient Medications on File Prior to Visit  Medication Sig Dispense Refill   acetaminophen (TYLENOL) 500 MG tablet Take 2 tablets (1,000 mg total) by mouth every 6 (six) hours as needed for moderate pain. 30 tablet 0   albuterol (PROVENTIL) (2.5 MG/3ML) 0.083% nebulizer solution Take 3 mLs (2.5 mg total) by nebulization every 6 (six) hours as needed for wheezing or shortness of breath. 150 mL 1   albuterol (VENTOLIN HFA) 108 (90 Base) MCG/ACT inhaler INHALE 2 PUFFS INTO THE LUNGS EVERY 6 HOURS AS NEEDED FOR WHEEZING OR SHORTNESS OF BREATH 18 g 0   amLODipine-olmesartan (AZOR) 5-20 MG tablet Take 1 tablet by mouth daily. 90 tablet 3   baclofen (LIORESAL) 10 MG tablet Take 1 tablet (10 mg total) by mouth 2 (two) times daily as needed for muscle spasms. 30 tablet 0   benzonatate (TESSALON) 100 MG capsule Take 1 capsule (100 mg total) by mouth 3 (three) times daily as needed for cough. 30 capsule 0   cetirizine (ZYRTEC) 10 MG tablet Take 10 mg by mouth  daily.     Cholecalciferol (VITAMIN D) 50 MCG (2000 UT) CAPS Take 2,000 Units by mouth daily.     diclofenac Sodium (VOLTAREN) 1 % GEL Apply to knee once daily as needed (Patient taking differently: Apply 4 g topically daily.) 100 g 1   ezetimibe (ZETIA) 10 MG tablet Take 1 tablet (10 mg total) by mouth daily. 90 tablet 3   ferrous sulfate 325 (65 FE) MG EC tablet Take 1 tablet (325 mg total) by mouth daily with breakfast. (Patient taking differently: Take 325 mg by mouth every other day.) 30 tablet 2   fesoterodine (TOVIAZ) 8 MG TB24 tablet Take 1 tablet (8 mg total) by mouth daily. 90 tablet 2   fluticasone (FLONASE) 50 MCG/ACT nasal spray Place 2  sprays into both nostrils in the morning and at bedtime. 18.2 mL 5   folic acid (FOLVITE) 1 MG tablet Take 1 tablet (1 mg total) by mouth daily. 90 tablet 3   furosemide (LASIX) 20 MG tablet Take 1 tablet (20 mg total) by mouth daily as needed for edema or fluid. 30 tablet 0   naltrexone (DEPADE) 50 MG tablet Take 25 mg by mouth daily.     omeprazole (PRILOSEC) 40 MG capsule TAKE 1 CAPSULE(40 MG) BY MOUTH IN THE MORNING AND AT BEDTIME 60 capsule 1   ondansetron (ZOFRAN) 4 MG tablet Take 1 tablet (4 mg total) by mouth every 6 (six) hours as needed for nausea. 20 tablet 0   OXYGEN Inhale into the lungs.     Semaglutide-Weight Management (WEGOVY) 0.5 MG/0.5ML SOAJ Inject 0.5 mg into the skin once a week. 2 mL 1   Tiotropium Bromide-Olodaterol (STIOLTO RESPIMAT) 2.5-2.5 MCG/ACT AERS Inhale 2 puffs into the lungs daily. 1 each 11   triamcinolone cream (KENALOG) 0.1 % Apply 1 Application topically 2 (two) times daily as needed. APPLY TO THE AFFECTED AREA TWICE DAILY AS NEEDED FOR SKIN IRRITATION (Patient taking differently: Apply 1 Application topically 2 (two) times daily as needed (Skin irritation.).) 45 g 0   valACYclovir (VALTREX) 500 MG tablet TAKE 1 TABLET(500 MG) BY MOUTH TWICE DAILY FOR 3 DAYS AT START OF OUTBREAK 6 tablet 2   XARELTO 20 MG TABS tablet TAKE 1 TABLET(20 MG) BY MOUTH IN THE MORNING 90 tablet 1   Zoster Vaccine Adjuvanted [place]) injection Administer Shingrix vaccination now and repeat in two months 1 each 1   No current facility-administered medications on file prior to visit.    Past Surgical History:  Procedure Laterality Date   ABDOMINAL HYSTERECTOMY N/A 03/13/2015   Procedure: TOTAL HYSTERECTOMY ABDOMINAL BILATERAL SALPINGO OOPHORECTOMY RADICAL TUMOR DEBUKING;  Surgeon: Adolphus Birchwood, MD;  Location: [facility];  Service: Gynecology;  Laterality: N/A;   BOWEL RESECTION  03/13/2015   Procedure: SMALL BOWEL RESECTION;  Surgeon: Adolphus Birchwood, MD;  Location: [facility];  Service:  Gynecology;;   CESAREAN SECTION  1983 and 1984   COLONOSCOPY WITH PROPOFOL N/A 07/19/2020   Procedure: COLONOSCOPY WITH PROPOFOL;  Surgeon: Sherrilyn Rist, MD;  [facility];  Service: Gastroenterology;  Laterality: N/A;   COLOSTOMY  04/07/2006   COLOSTOMY TAKEDOWN     ESOPHAGOGASTRODUODENOSCOPY (EGD) WITH PROPOFOL N/A 12/21/2018   Procedure: ESOPHAGOGASTRODUODENOSCOPY (EGD) WITH PROPOFOL;  Surgeon: Sherrilyn Rist, MD;  [facility];  Service: Gastroenterology;  Laterality: N/A;   HEMOSTASIS CLIP PLACEMENT  07/19/2020   Procedure: HEMOSTASIS CLIP PLACEMENT;  Surgeon: Sherrilyn Rist, MD;  [facility];  Service: Gastroenterology;;  IR REMOVAL TUN ACCESS W/ PORT W/O FL MOD SED  12/17/2022   LAPAROTOMY N/A 03/13/2015   Procedure: EXPLORATORY LAPAROTOMY;  Surgeon: Adolphus Birchwood, MD;  Location: [facility];  Service: Gynecology;  Laterality: N/A;   POLYPECTOMY  07/19/2020   Procedure: POLYPECTOMY;  Surgeon: Sherrilyn Rist, MD;  [facility];  Service: Gastroenterology;;   TONSILLECTOMY AND ADENOIDECTOMY  04/08/1995   VENTRAL HERNIA REPAIR  03/13/2015   Procedure: HERNIA REPAIR VENTRAL ADULT;  Surgeon: Adolphus Birchwood, MD;  Location: [facility];  Service: Gynecology;;    Allergies  Allergen Reactions   Tape Itching    Surgical tape   Penicillins Rash    Has patient had a PCN reaction causing immediate rash, facial/tongue/throat swelling, SOB or lightheadedness with hypotension: no Has patient had a PCN reaction causing severe rash involving mucus membranes or skin necrosis: no Has patient had a PCN reaction that required hospitalization in the hospital at the time Has patient had a PCN reaction occurring within the last 10 years: no If all of the above answers are "NO", then may proceed with Cephalosporin use.     BP 138/68   Ht 5' 5.5" (1.664 m)   Wt (!) 363 lb (164.7 kg)   LMP 10/16/2013 (Approximate)   BMI 59.49 kg/m       No data to  display              No data to display              Objective:  Physical Exam:  Gen: NAD, comfortable in exam room  MSK:   Right knee- no new bruising or swelling. Tender to palpation in medial knee. ROM limited on extension. Strength 5/5 bilaterally. Reflexes 2+ and symmetric.   Low back - Tender to palpation on right lower back. Neuro: no sensory or motor deficits    Assessment & Plan:   1.The patient presents with acute right lower back pain likely secondary to a lumbar muscle strain. Onset occurred suddenly while rising from a chair, accompanied by a "pop" and immediate discomfort, without any neurologic symptoms or radiation. Pain is described as intermittent, dull, and achy, localized to the right lower back. Her history of prior back pain and minimal response to topical treatments as well as tenderness to palpation on exam support a musculoskeletal etiology. She reports improvement with baclofen, which further suggests a muscular component.   Plan:  - Continue baclofen as needed - Start PT exercises for back pain - Start water therapy 2x/week  - Consider applying heat to lower back for pain relief   2. Right knee pain is gradually improving following a recent injection. Symptoms are activity-related and she is set to begin water therapy, which is appropriate for her ongoing joint management.  Plan:  - Continue with planned water therapy 2x/week - Continue physical therapy for right knee  - Continue Tylenol for pain relief   - Follow up for next injection

## 2023-07-17 ENCOUNTER — Ambulatory Visit (HOSPITAL_BASED_OUTPATIENT_CLINIC_OR_DEPARTMENT_OTHER): Admitting: Physical Therapy

## 2023-07-17 ENCOUNTER — Encounter (HOSPITAL_BASED_OUTPATIENT_CLINIC_OR_DEPARTMENT_OTHER): Payer: Self-pay | Admitting: Physical Therapy

## 2023-07-17 DIAGNOSIS — R2689 Other abnormalities of gait and mobility: Secondary | ICD-10-CM | POA: Diagnosis not present

## 2023-07-17 DIAGNOSIS — G8929 Other chronic pain: Secondary | ICD-10-CM

## 2023-07-17 DIAGNOSIS — M6281 Muscle weakness (generalized): Secondary | ICD-10-CM

## 2023-07-17 DIAGNOSIS — M1711 Unilateral primary osteoarthritis, right knee: Secondary | ICD-10-CM | POA: Diagnosis not present

## 2023-07-17 DIAGNOSIS — M25561 Pain in right knee: Secondary | ICD-10-CM | POA: Diagnosis not present

## 2023-07-17 NOTE — Therapy (Addendum)
 OUTPATIENT PHYSICAL THERAPY LOWER EXTREMITY TREATMENT   Patient Name: [name] MRN: [id number] DOB:23-Apr-1965, 58 y.o., female Today's Date: 07/17/2023  END OF SESSION:  PT End of Session - 07/17/23 0932     Visit Number 2    Number of Visits 12    Date for PT Re-Evaluation 09/04/23    Authorization Type [employer] MCR    PT Start Time 0930    PT Stop Time 1008    PT Time Calculation (min) 38 min    Activity Tolerance Patient tolerated treatment well    Behavior During Therapy WFL for tasks assessed/performed             Past Medical History:  Diagnosis Date   Allergy    Anemia    Anxiety    Arthritis    Blood transfusion without reported diagnosis    Cough    Cough 06/11/2018   Difficulty sleeping 06/11/2018   Diverticulitis with perforation 2010   DVT (deep vein thrombosis) in pregnancy 11/24/2016   DVT, lower extremity (HCC)    Endometrial cancer (HCC)    Enlarged lymph nodes 08/13/2017   Family history of adverse reaction to anesthesia    pts daughter had difficulty awakening following anesthesia, long time to wake up   GERD (gastroesophageal reflux disease)    Headache    Hemoptysis 04/22/2018   History of bronchitis    History of ear infections    Hospital discharge follow-up 03/02/2019   Hx of migraines    Hyperlipidemia    Hypertension    IBS (irritable bowel syndrome)    Kidney infection    Lupus (systemic lupus erythematosus) (HCC) 02/2017   Morbid obesity (HCC)    Neck pain 01/13/2019   Ovarian cancer (HCC) dx'd 01/2015   PONV (postoperative nausea and vomiting)    Port-A-Cath in place    Right side   Portacath in place 09/12/2015   Pre-diabetes    Pyelonephritis    Right knee pain 11/28/2011   Scleroderma (HCC)    Screen for colon cancer 03/02/2019   Slow rate of speech 08/22/2015   Chronic from CVA.  She also has loss of left nasolabial fold and slight left facial droop/small asymmetry on the left side secondary to CVA   UTI (urinary tract  infection) 05/07/2018   Past Surgical History:  Procedure Laterality Date   ABDOMINAL HYSTERECTOMY N/A 03/13/2015   Procedure: TOTAL HYSTERECTOMY ABDOMINAL BILATERAL SALPINGO OOPHORECTOMY RADICAL TUMOR DEBUKING;  Surgeon: Adolphus Birchwood, MD;  Location: [facility];  Service: Gynecology;  Laterality: N/A;   BOWEL RESECTION  03/13/2015   Procedure: SMALL BOWEL RESECTION;  Surgeon: Adolphus Birchwood, MD;  Location: [facility];  Service: Gynecology;;   CESAREAN SECTION  1983 and 1984   COLONOSCOPY WITH PROPOFOL N/A 07/19/2020   Procedure: COLONOSCOPY WITH PROPOFOL;  Surgeon: Sherrilyn Rist, MD;  Location: [facility];  Service: Gastroenterology;  Laterality: N/A;   COLOSTOMY  04/07/2006   COLOSTOMY TAKEDOWN     ESOPHAGOGASTRODUODENOSCOPY (EGD) WITH PROPOFOL N/A 12/21/2018   Procedure: ESOPHAGOGASTRODUODENOSCOPY (EGD) WITH PROPOFOL;  Surgeon: Sherrilyn Rist, MD;  Location: [facility];  Service: Gastroenterology;  Laterality: N/A;   HEMOSTASIS CLIP PLACEMENT  07/19/2020   Procedure: HEMOSTASIS CLIP PLACEMENT;  Surgeon: Sherrilyn Rist, MD;  Location: [facility];  Service: Gastroenterology;;   IR REMOVAL TUN ACCESS W/ PORT W/O FL MOD SED  12/17/2022   LAPAROTOMY N/A 03/13/2015   Procedure: EXPLORATORY LAPAROTOMY;  Surgeon: Adolphus Birchwood, MD;  Location: [facility];  Service: Gynecology;  Laterality: N/A;   POLYPECTOMY  07/19/2020   Procedure: POLYPECTOMY;  Surgeon: Sherrilyn Rist, MD;  Location: [facility];  Service: Gastroenterology;;   TONSILLECTOMY AND ADENOIDECTOMY  04/08/1995   VENTRAL HERNIA REPAIR  03/13/2015   Procedure: HERNIA REPAIR VENTRAL ADULT;  Surgeon: Adolphus Birchwood, MD;  Location: [facility];  Service: Gynecology;;   Patient Active Problem List   Diagnosis Date Noted   Primary osteoarthritis of right knee 06/04/2023   Folic acid deficiency 05/23/2023   Routine adult health maintenance 02/07/2023   Congestion of upper airway 07/22/2022   Recurrent pneumonia 07/08/2022   Acute respiratory  failure with hypoxia (HCC) 06/25/2022   Aspiration into respiratory tract 06/25/2022   Injury of right foot 06/12/2022   Right knee pain 05/26/2022   Multifocal pneumonia 11/19/2021   Acute recurrent maxillary sinusitis 10/31/2021   Viral illness 06/05/2021   Chronic diastolic CHF (congestive heart failure) (HCC) 07/19/2020   Bronchospasm 07/19/2020   Incontinence 03/17/2020   Herpes simplex virus (HSV) infection of buttock 01/05/2020   OSA (obstructive sleep apnea) 07/25/2019   Prediabetes 07/25/2019   Lateral epicondylitis of left elbow 01/13/2019   Normocytic anemia 05/29/2018   Scleroderma (HCC) 04/22/2018   Bloody sputum 04/22/2018   Swelling of foot joint, right 04/08/2018   Positive ANA (antinuclear antibody) 09/23/2016   Eczema 01/27/2016   History of pulmonary embolism 09/12/2015   Chronic anticoagulation 09/06/2015   Chemotherapy-induced peripheral neuropathy (HCC) 06/13/2015   CKD (chronic kidney disease) stage 3, GFR 30-59 ml/min (HCC) 05/12/2015   GERD (gastroesophageal reflux disease) 05/12/2015   Ovarian carcinosarcoma, 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   Endometrial ca (HCC) 03/29/2015   Ventral hernia without obstruction or gangrene    Morbid obesity (HCC) 02/19/2015   Essential hypertension, benign    Stress at home 12/14/2014   Left knee pain 08/04/2013   Seasonal allergies 07/29/2013    PCP: Levert Feinstein MD  REFERRING PROVIDER: Andi Devon, DO   REFERRING DIAG: Primary osteoarthritis of right knee   THERAPY DIAG:  Chronic pain of right knee  Muscle weakness (generalized)  Other abnormalities of gait and mobility  Rationale for Evaluation and Treatment: Rehabilitation  ONSET DATE: exacerbated x 2 months  SUBJECTIVE:   SUBJECTIVE STATEMENT: My back went out on Sunday while getting out of low chair. Saw sports medicine dr yesterday, wants to add the back to PT.  Was given muscle relaxers from primary care dr.   Initial eval:  Cortizone  injections x 1 month ago with little relief. Symptoms x >5 yrs. Neuropathies bilat feet due to chemo. Membership here at [facility]. Had 6 episodes of aspiration pneum last year, on wagovia for weight loss to potentially repair a hernia.  On bl thinner.  Hoping to have knee replacements when BMI 40.  PERTINENT HISTORY: Evaluate and treat for right knee bone on bone osteoarthritis. Please include aquatic therapy in treatment plan. PAIN:  Are you having pain? Yes: NPRS scale: current 6/10 Pain location:R knee and lower back Pain description: dull ache/pain Aggravating factors: walking Relieving factors: resting, non-WB  PRECAUTIONS: None  RED FLAGS: None   WEIGHT BEARING RESTRICTIONS: No  FALLS:  Has patient fallen in last 6 months? Yes. Number of falls 1 tripped over husbands shoe  LIVING ENVIRONMENT: Lives with: lives with their family Lives in: House/apartment Stairs:  2 step ith handrail Has following equipment at home: Single point cane  OCCUPATION: disability  PLOF: Independent  PATIENT GOALS: decreased  pain, lose weight  NEXT MD VISIT: end of month  OBJECTIVE:  Note: Objective measures were completed at Evaluation unless otherwise noted.  DIAGNOSTIC FINDINGS: RIGHT KNEE - COMPLETE 4+ VIEW   COMPARISON:  July 27, 2013   FINDINGS: Osteoarthrosis with narrowing of the medial compartment with marginal osteophytes. Femoropatellar osteoarthrosis. Mild narrowing of the lateral and medial compartments of the patella. No fractures   IMPRESSION: Osteoarthrosis.  PATIENT SURVEYS:  LEFS 26/80  COGNITION: Overall cognitive status: Within functional limits for tasks assessed     SENSATION: WFL  EDEMA:  Unable to define due to body habitus  MUSCLE LENGTH: Hamstrings: slight tightness   PALPATION: TTP medial right joint line knee  LOWER EXTREMITY strength:  Strength (lbs) Right eval Left eval  Hip flexion 43.5 40.7  Hip extension    Hip abduction 28.1  34.3  Hip adduction    Hip internal rotation    Hip external rotation    Knee flexion 45.7 47.0  Knee extension    Ankle dorsiflexion    Ankle plantarflexion    Ankle inversion    Ankle eversion     (Blank rows = not tested)  LOWER EXTREMITY MMT:  ROM Right eval Left eval  Hip flexion    Hip extension    Hip abduction    Hip adduction    Hip internal rotation    Hip external rotation    Knee flexion 100 112  Knee extension    Ankle dorsiflexion    Ankle plantarflexion    Ankle inversion    Ankle eversion     (Blank rows = not tested) Difficult to assess due to body habitus   FUNCTIONAL TESTS:  5 times sit to stand: 14.42 pool bench Timed up and go (TUG): 13.87 4 stage: passed  GAIT: Distance walked: 500 ft Assistive device utilized: Single point cane Level of assistance: Complete Independence Comments: wide BOS (due to body habitus), knee and hip flex limited just clearing feet from floor. Increased lateral displacement                                                                                                                                TREATMENT  [facility] Adult PT Treatment:                                                DATE: 07/17/23 Pt seen for aquatic therapy today.  Treatment took place in water 3.5-4.75 ft in depth at the [facility] pool. Temp of water was 91.  Pt entered/exited the pool via stairs independently with bilat rail.  - unsupported walking forward/ backward, multiple laps at 4 ft - side stepping with arm addct/ abdct -> with rainbow hand floats -> yellow hand floats  - farmer carry with bilat yellow and single  blue hand floats at side, marching forward/ backward  - knee tap to same side/ opp side to rainbow hand floats with marching backward forward - UE on wall:  heel raises x 10; hip add/abd x 10 ( cues to limit height of leg) ; hip flexion /extension x 10 - UE on yellow hand floats:  hip abdct/ addct x 5; hip flex/ ext x 5;  heel raises  - TrA set with long hollow noodle pull down to thighs x 10; repeated in staggered stance  - straddling noodle with UE on corner support: cycling   Pt requires the buoyancy and hydrostatic pressure of water for support, and to offload joints by unweighting joint load by at least 50 % in navel deep water and by at least 75-80% in chest to neck deep water.  Viscosity of the water is needed for resistance of strengthening. Water current perturbations provides challenge to standing balance requiring increased core activation.    PATIENT EDUCATION:  Education details: reacquainting with aquatic therapy Person educated: Patient Education method: Explanation Education comprehension: verbalized understanding  HOME EXERCISE PROGRAM: Aquatic:Has assigned from previous episodes 1st episode: [id number]. 2nd episode: [id number]    ASSESSMENT:  CLINICAL IMPRESSION: Pt demonstrates safety and independence in aquatic setting with therapist instructing from deck. Pt demonstrates confidence in setting, moving throughout all depths easily.  Pt is familiarized with previous exercises performed in episodes of care in 2022 and 2023. Will plan to update HEP and also work on STS with good mechanics.  Amaal is a good candidate for aquatic therapy and will benefit from the properties of water to progress towards land based goals. Goals are ongoing.     Initial impression:  Patient is a 59 y.o. f who was seen today for physical therapy evaluation and treatment for R knee oa. Pt is well known to this clinic.  She has had multiple episodes through other [facility] for various dysfunctions (LB and LE).  Is a member here at [employer].  Has had respiratory issues for past year decreasing her ability to exercises/come to gym.  She presents today with right knee pain due to OA.  Her ROM and strength reflect limitation as compared to left.  She is trying to decrease her BMI to be a candidate for TKR but has struggled  with this for the past few years.  She will benefit from skilled PT to improve deficits and assign, encourage regular exercise program for pain and condition management. Plan on combination of land and aquatics for optimal outcome.    OBJECTIVE IMPAIRMENTS: Abnormal gait, decreased endurance, decreased mobility, difficulty walking, decreased ROM, decreased strength, obesity, and pain.   ACTIVITY LIMITATIONS: lifting, standing, squatting, sleeping, stairs, and locomotion level  PARTICIPATION LIMITATIONS: shopping, community activity, and occupation  PERSONAL FACTORS: Behavior pattern, Fitness, Past/current experiences, Time since onset of injury/illness/exacerbation, and 1-2 comorbidities: see pmhx   are also affecting patient's functional outcome.   REHAB POTENTIAL: Good  CLINICAL DECISION MAKING: Stable/uncomplicated  EVALUATION COMPLEXITY: Low   GOALS: Goals reviewed with patient? Yes  SHORT TERM GOALS: Target date: 4/25 Pt will tolerate full aquatic sessions consistently without increase in pain and with improving function to demonstrate good toleration and effectiveness of intervention.  Baseline: Goal status: INITIAL  2.  Pt will report complete reduction in right knee pain while submerged with reduction in pain for a few hours afterward. Baseline: 5/10 Goal status: INITIAL  3.  Pt will report return to regular exercise regimen. Baseline: none Goal  status: INITIAL   LONG TERM GOALS: Target date: 09/03/23  Pt to improve on LEFS by at least 9 point to demonstrate statistically significant Improvement in function. Baseline:  Goal status: INITIAL  2.  Pt will be indep with final HEP's (land and aquatic as appropriate) for continued management of condition Baseline:  Goal status: INITIAL  3.  Pt will report decrease in pain by at least 50% for improved toleration to activity/quality of life and to demonstrate improved management of pain. Baseline:  Goal status:  INITIAL  4.  Pt will improve on 5 X STS test to <or= 11s  to demonstrate improving functional lower extremity strength, transitional movements, and balance Baseline: 14.42 Goal status: INITIAL  5.  Pt will improve on Tug test to <or=  11s to demonstrate improvement in lower extremity function, mobility and decreased fall risk. Baseline: 13.87 Goal status: INITIAL     PLAN:  PT FREQUENCY: 2x/week  PT DURATION: 8 weeks 12 visit (extended due to vacation)  PLANNED INTERVENTIONS: 13244- PT Re-evaluation, 97110-Therapeutic exercises, 97530- Therapeutic activity, 97112- Neuromuscular re-education, 97535- Self Care, 01027- Manual therapy, [phone]- Gait training, [phone]- Orthotic Fit/training, [phone]- Aquatic Therapy, [phone]- Ionotophoresis 4mg /ml Dexamethasone, Patient/Family education, Balance training, Stair training, Taping, Dry Needling, Joint mobilization, DME instructions, Cryotherapy, and Moist heat  PLAN FOR NEXT SESSION: aquatics: le strength; balance and proprioceptive retraining. Aquatic HEP Land: OA right knee strengthening /rom/stretching.  HEP  Mayer Camel, [place] 07/17/23 10:11 AM [facility] [address] [place], [place], [address] Phone: [phone]   Fax:  [phone]   Referring diagnosis? Primary osteoarthritis of right knee  Treatment diagnosis? (if different than referring diagnosis) Primary osteoarthritis of right knee  What was this (referring dx) caused by? []  Surgery [x]  Fall [x]  Ongoing issue []  Arthritis []  Other: ____________  Laterality: [x]  Rt []  Lt []  Both  Check all possible CPT codes:  *CHOOSE 10 OR LESS*    See Planned Interventions listed in the Plan section of the Evaluation.

## 2023-07-22 ENCOUNTER — Ambulatory Visit: Admitting: Student

## 2023-07-22 ENCOUNTER — Telehealth: Payer: Self-pay | Admitting: Student

## 2023-07-22 ENCOUNTER — Ambulatory Visit
Admission: RE | Admit: 2023-07-22 | Discharge: 2023-07-22 | Disposition: A | Discharge status: Home / Self Care | Source: Ambulatory Visit | Attending: Family Medicine | Admitting: Family Medicine

## 2023-07-22 VITALS — BP 118/80 | HR 78 | Temp 97.5°F | Ht 65.0 in | Wt 372.0 lb

## 2023-07-22 DIAGNOSIS — J069 Acute upper respiratory infection, unspecified: Secondary | ICD-10-CM

## 2023-07-22 DIAGNOSIS — R051 Acute cough: Secondary | ICD-10-CM

## 2023-07-22 DIAGNOSIS — J189 Pneumonia, unspecified organism: Secondary | ICD-10-CM

## 2023-07-22 MED ORDER — CEFDINIR 300 MG PO CAPS: 300.0000 mg | ORAL_CAPSULE | Freq: Two times a day (BID) | ORAL | 0 refills | Status: DC

## 2023-07-22 MED ORDER — AZITHROMYCIN 500 MG PO TABS: 500.0000 mg | ORAL_TABLET | Freq: Every day | ORAL | 0 refills | Status: AC

## 2023-07-22 NOTE — Assessment & Plan Note (Addendum)
 Symptoms most consistent with likely viral URI and possibly exacerbated by seasonal allergies. Low suspicion for pneumonia given how well-appearing she is and there are no focal lung sounds. Will obtain imaging to rule out pneumonia given her history.   - Order chest x-ray to evaluate for pneumonia.  - Continue daily inhaler and albuterol inhaler as needed - Use home oxygen as needed - Use acetaminophen for aches or fever.  - Advise to contact if symptoms worsen or breathing difficulty occurs.   - If x-ray is negative for pneumonia and symptoms persist at day 10, could consider antibiotics to treat sinusitis given tenderness of maxillary sinuses

## 2023-07-22 NOTE — Progress Notes (Signed)
    SUBJECTIVE:   CHIEF COMPLAINT / HPI:   The patient, with a known history of recurrent pneumonia, presents with cough, wheezing and a general feeling of being unwell. The onset of these symptoms was 5 days ago. The patient also reports experiencing chills and body aches, similar to the onset of a cold. They deny any fever, vomiting, or diarrhea. They also mention a history of seasonal allergies, which they believe may be contributing to their current symptoms, including a runny nose, congestion and eye irritation. They have been using Flonase nasal spray as needed for these symptoms but not daily. The patient also reports coughing up phlegm. They deny any recent worsening of acid reflux symptoms. The patient uses albuterol and daily Stiolto respimat inhalers for their respiratory symptoms and has been supplementing with oxygen at home, which they report has been helpful.   PERTINENT  PMH / PSH: OSA, recurrent pneumonia, GERD  OBJECTIVE:   BP 118/80   Pulse 78   Temp (!) 97.5 F (36.4 C)   Ht 5\' 5"  (1.651 m)   Wt (!) 372 lb (168.7 kg)   LMP 10/16/2013 (Approximate)   SpO2 99%   BMI 61.90 kg/m    General: NAD, pleasant, well-appearing HEENT: White sclera, clear conjunctiva, MMM, no erythema or exudate of posterior pharynx, mild TTP of maxillary sinuses Cardiac: RRR, no murmurs. Respiratory: CTAB, normal effort, No wheezes, rales or rhonchi Extremities: Mild edema BLEs which patient states is at baseline Skin: warm and dry Neuro: alert, no obvious focal deficits Psych: Normal affect and mood  ASSESSMENT/PLAN:   Upper respiratory tract infection Symptoms most consistent with likely viral URI and possibly exacerbated by seasonal allergies. Low suspicion for pneumonia given how well-appearing she is and there are no focal lung sounds. Will obtain imaging to rule out pneumonia given her history.   - Order chest x-ray to evaluate for pneumonia.  - Continue daily inhaler and albuterol  inhaler as needed - Use home oxygen as needed - Use acetaminophen for aches or fever.  - Advise to contact if symptoms worsen or breathing difficulty occurs.   - If x-ray is negative for pneumonia and symptoms persist at day 10, could consider antibiotics to treat sinusitis given tenderness of maxillary sinuses     Dr. Glenn Lange, DO [facility]

## 2023-07-22 NOTE — Patient Instructions (Addendum)
 It was great to see you! Thank you for allowing me to participate in your care!  I recommend that you always bring your medications to each appointment as this makes it easy to ensure you are on the correct medications and helps us  not miss when refills are needed.  Our plans for today:  - Go to [address] for chest x ray now - If there are concerns for pneumonia on x ray I will send in antibiotics and notify you - Increase flonase nasal spray to every day and continue daily inhaler and albuterol inhaler as needed. Can also use tylenol for aches or fever  - If chest x ray is clear but symptoms worsen, please contact me or return for an appointment   We are checking some labs today, I will call you if they are abnormal will send you a MyChart message or a letter if they are normal.  If you do not hear about your labs in the next 2 weeks please let us  know.  Take care and seek immediate care sooner if you develop any concerns.   Dr. Glenn Lange, DO [facility]

## 2023-07-22 NOTE — Telephone Encounter (Signed)
 Called pt to discuss CXR results showing mild opacity of R mid lobe. Shared decision making used and will send in abx to treat CAP. See office note from earlier today.  -Rx cefdinir and azithromycin, return if symptoms worsen/don't improve. Has PCN allergy but tolerated cefdinir without issues this past December.

## 2023-08-03 ENCOUNTER — Ambulatory Visit (HOSPITAL_BASED_OUTPATIENT_CLINIC_OR_DEPARTMENT_OTHER): Admitting: Physical Therapy

## 2023-08-05 ENCOUNTER — Other Ambulatory Visit: Payer: Self-pay | Admitting: Family Medicine

## 2023-08-06 ENCOUNTER — Ambulatory Visit (HOSPITAL_BASED_OUTPATIENT_CLINIC_OR_DEPARTMENT_OTHER): Attending: Family Medicine | Admitting: Physical Therapy

## 2023-08-06 ENCOUNTER — Encounter (HOSPITAL_BASED_OUTPATIENT_CLINIC_OR_DEPARTMENT_OTHER): Payer: Self-pay | Admitting: Physical Therapy

## 2023-08-06 DIAGNOSIS — M6281 Muscle weakness (generalized): Secondary | ICD-10-CM | POA: Diagnosis not present

## 2023-08-06 DIAGNOSIS — R2689 Other abnormalities of gait and mobility: Secondary | ICD-10-CM | POA: Diagnosis not present

## 2023-08-06 DIAGNOSIS — M25561 Pain in right knee: Secondary | ICD-10-CM | POA: Insufficient documentation

## 2023-08-06 DIAGNOSIS — M5459 Other low back pain: Secondary | ICD-10-CM | POA: Insufficient documentation

## 2023-08-06 DIAGNOSIS — G8929 Other chronic pain: Secondary | ICD-10-CM | POA: Insufficient documentation

## 2023-08-06 DIAGNOSIS — M545 Low back pain, unspecified: Secondary | ICD-10-CM | POA: Insufficient documentation

## 2023-08-06 NOTE — Therapy (Signed)
 OUTPATIENT PHYSICAL THERAPY LOWER EXTREMITY TREATMENT LB assessment  Patient Name: [name] MRN: [id number] DOB:10-09-1965, 58 y.o., female Today's Date: 08/06/2023  END OF SESSION:  PT End of Session - 08/06/23 0853     Visit Number 3    Number of Visits 12    Date for PT Re-Evaluation 09/04/23    Authorization Type [employer] MCR    PT Start Time 0847    PT Stop Time 0930    PT Time Calculation (min) 43 min    Activity Tolerance Patient tolerated treatment well    Behavior During Therapy [facility] for tasks assessed/performed             Past Medical History:  Diagnosis Date   Allergy    Anemia    Anxiety    Arthritis    Blood transfusion without reported diagnosis    Cough    Cough 06/11/2018   Difficulty sleeping 06/11/2018   Diverticulitis with perforation 2010   DVT (deep vein thrombosis) in pregnancy 11/24/2016   DVT, lower extremity (HCC)    Endometrial cancer (HCC)    Enlarged lymph nodes 08/13/2017   Family history of adverse reaction to anesthesia    pts daughter had difficulty awakening following anesthesia, long time to wake up   GERD (gastroesophageal reflux disease)    Headache    Hemoptysis 04/22/2018   History of bronchitis    History of ear infections    Hospital discharge follow-up 03/02/2019   Hx of migraines    Hyperlipidemia    Hypertension    IBS (irritable bowel syndrome)    Kidney infection    Lupus (systemic lupus erythematosus) (HCC) 02/2017   Morbid obesity (HCC)    Neck pain 01/13/2019   Ovarian cancer (HCC) dx'd 01/2015   PONV (postoperative nausea and vomiting)    Port-A-Cath in place    Right side   Portacath in place 09/12/2015   Pre-diabetes    Pyelonephritis    Right knee pain 11/28/2011   Scleroderma (HCC)    Screen for colon cancer 03/02/2019   Slow rate of speech 08/22/2015   Chronic from CVA.  She also has loss of left nasolabial fold and slight left facial droop/small asymmetry on the left side secondary to CVA   UTI  (urinary tract infection) 05/07/2018   Past Surgical History:  Procedure Laterality Date   ABDOMINAL HYSTERECTOMY N/A 03/13/2015   Procedure: TOTAL HYSTERECTOMY ABDOMINAL BILATERAL SALPINGO OOPHORECTOMY RADICAL TUMOR DEBUKING;  Surgeon: Alphonso Aschoff, MD;  Location: [facility];  Service: Gynecology;  Laterality: N/A;   BOWEL RESECTION  03/13/2015   Procedure: SMALL BOWEL RESECTION;  Surgeon: Alphonso Aschoff, MD;  Location: [facility];  Service: Gynecology;;   CESAREAN SECTION  1983 and 1984   COLONOSCOPY WITH PROPOFOL  N/A 07/19/2020   Procedure: COLONOSCOPY WITH PROPOFOL ;  Surgeon: Albertina Hugger, MD;  Location: [facility];  Service: Gastroenterology;  Laterality: N/A;   COLOSTOMY  04/07/2006   COLOSTOMY TAKEDOWN     ESOPHAGOGASTRODUODENOSCOPY (EGD) WITH PROPOFOL  N/A 12/21/2018   Procedure: ESOPHAGOGASTRODUODENOSCOPY (EGD) WITH PROPOFOL ;  Surgeon: Albertina Hugger, MD;  Location: [facility];  Service: Gastroenterology;  Laterality: N/A;   HEMOSTASIS CLIP PLACEMENT  07/19/2020   Procedure: HEMOSTASIS CLIP PLACEMENT;  Surgeon: Albertina Hugger, MD;  Location: [facility];  Service: Gastroenterology;;   IR REMOVAL TUN ACCESS W/ PORT W/O FL MOD SED  12/17/2022   LAPAROTOMY N/A 03/13/2015   Procedure: EXPLORATORY LAPAROTOMY;  Surgeon: Alphonso Aschoff, MD;  Location: [facility];  Service: Gynecology;  Laterality: N/A;   POLYPECTOMY  07/19/2020   Procedure: POLYPECTOMY;  Surgeon: Albertina Hugger, MD;  Location: [facility];  Service: Gastroenterology;;   TONSILLECTOMY AND ADENOIDECTOMY  04/08/1995   VENTRAL HERNIA REPAIR  03/13/2015   Procedure: HERNIA REPAIR VENTRAL ADULT;  Surgeon: Alphonso Aschoff, MD;  Location: [facility];  Service: Gynecology;;   Patient Active Problem List   Diagnosis Date Noted   Upper respiratory tract infection 07/22/2023   Primary osteoarthritis of right knee 06/04/2023   Folic acid  deficiency 05/23/2023   Routine adult health maintenance 02/07/2023   Congestion of upper airway  07/22/2022   Recurrent pneumonia 07/08/2022   Acute respiratory failure with hypoxia (HCC) 06/25/2022   Aspiration into respiratory tract 06/25/2022   Injury of right foot 06/12/2022   Right knee pain 05/26/2022   Multifocal pneumonia 11/19/2021   Acute recurrent maxillary sinusitis 10/31/2021   Viral illness 06/05/2021   Chronic diastolic CHF (congestive heart failure) (HCC) 07/19/2020   Bronchospasm 07/19/2020   Incontinence 03/17/2020   Herpes simplex virus (HSV) infection of buttock 01/05/2020   OSA (obstructive sleep apnea) 07/25/2019   Prediabetes 07/25/2019   Lateral epicondylitis of left elbow 01/13/2019   Normocytic anemia 05/29/2018   Scleroderma (HCC) 04/22/2018   Bloody sputum 04/22/2018   Swelling of foot joint, right 04/08/2018   Positive ANA (antinuclear antibody) 09/23/2016   Eczema 01/27/2016   History of pulmonary embolism 09/12/2015   Chronic anticoagulation 09/06/2015   Chemotherapy-induced peripheral neuropathy (HCC) 06/13/2015   CKD (chronic kidney disease) stage 3, GFR 30-59 ml/min (HCC) 05/12/2015   GERD (gastroesophageal reflux disease) 05/12/2015   Ovarian carcinosarcoma, 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   Endometrial ca (HCC) 03/29/2015   Ventral hernia without obstruction or gangrene    Morbid obesity (HCC) 02/19/2015   Essential hypertension, benign    Stress at home 12/14/2014   Left knee pain 08/04/2013   Seasonal allergies 07/29/2013    PCP: Daleen Dubs MD  REFERRING PROVIDER: Rodgers Clack, DO   REFERRING DIAG: Primary osteoarthritis of right knee     08/06/23: M54.50 (ICD-10-CM) - Acute right-sided low back pain without sciatica   THERAPY DIAG:  Chronic pain of right knee  Muscle weakness (generalized)  Other abnormalities of gait and mobility  Other low back pain  Rationale for Evaluation and Treatment: Rehabilitation  ONSET DATE: exacerbated x 2 months  SUBJECTIVE:   SUBJECTIVE STATEMENT: I had a pneumonia and needed to cancel  a few appts.  Doing better, Knee feels better.  Back is bothering more than knee.    Initial eval:  Cortizone injections x 1 month ago with little relief. Symptoms x >5 yrs. Neuropathies bilat feet due to chemo. Membership here at [facility]. Had 6 episodes of aspiration pneum last year, on wagovia for weight loss to potentially repair a hernia.  On bl thinner.  Hoping to have knee replacements when BMI 40.  PERTINENT HISTORY: Evaluate and treat for right knee bone on bone osteoarthritis. Please include aquatic therapy in treatment plan. PAIN:  Are you having pain? Yes: NPRS scale: current 4/10 Pain location:R knee  Pain description: dull ache/pain Aggravating factors: walking Relieving factors: resting, non-WB  LBP 6/10 Right sided lumbar spine paraspinal area  PRECAUTIONS: None  RED FLAGS: None   WEIGHT BEARING RESTRICTIONS: No  FALLS:  Has patient fallen in last 6 months? Yes. Number of falls 1 tripped over husbands shoe  LIVING ENVIRONMENT: Lives with: lives with their family Lives  in: House/apartment Stairs:  2 step ith handrail Has following equipment at home: Single point cane  OCCUPATION: disability  PLOF: Independent  PATIENT GOALS: decreased pain, lose weight  NEXT MD VISIT: end of month  OBJECTIVE:  Note: Objective measures were completed at Evaluation unless otherwise noted.  DIAGNOSTIC FINDINGS: RIGHT KNEE - COMPLETE 4+ VIEW   COMPARISON:  July 27, 2013   FINDINGS: Osteoarthrosis with narrowing of the medial compartment with marginal osteophytes. Femoropatellar osteoarthrosis. Mild narrowing of the lateral and medial compartments of the patella. No fractures   IMPRESSION: Osteoarthrosis.  PATIENT SURVEYS:  LEFS 26/80  COGNITION: Overall cognitive status: Within functional limits for tasks assessed     SENSATION: WFL  EDEMA:  Unable to define due to body habitus  MUSCLE LENGTH: Hamstrings: slight tightness   PALPATION: TTP medial  right joint line knee  LUMBAR ROM:   Active  A/PROM  eval  Flexion FT to below patella  Extension 50% limited  Right lateral flexion wfl  Left lateral flexion wfl  Right rotation   Left rotation    (Blank rows = not tested)   LOWER EXTREMITY strength:  Strength (lbs) Right eval Left eval  Hip flexion 43.5 40.7  Hip extension    Hip abduction 28.1 34.3  Hip adduction    Hip internal rotation    Hip external rotation    Knee flexion 45.7 47.0  Knee extension    Ankle dorsiflexion    Ankle plantarflexion    Ankle inversion    Ankle eversion     (Blank rows = not tested)  LOWER EXTREMITY MMT:  ROM Right eval Left eval  Hip flexion    Hip extension    Hip abduction    Hip adduction    Hip internal rotation    Hip external rotation    Knee flexion 100 112  Knee extension    Ankle dorsiflexion    Ankle plantarflexion    Ankle inversion    Ankle eversion     (Blank rows = not tested) Difficult to assess due to body habitus   FUNCTIONAL TESTS:  5 times sit to stand: 14.42 pool bench Timed up and go (TUG): 13.87 4 stage: passed  GAIT: Distance walked: 500 ft Assistive device utilized: Single point cane Level of assistance: Complete Independence Comments: wide BOS (due to body habitus), knee and hip flex limited just clearing feet from floor. Increased lateral displacement                                                                                                                                TREATMENT  [facility] Adult PT Treatment:                                                DATE: 08/06/23  LBP assessment completed  Pt seen for aquatic therapy today.  Treatment took place in water 3.5-4.75 ft in depth at the [facility] pool. Temp of water was 91.  Pt entered/exited the pool via stairs independently with bilat rail.  - unsupported walking forward/ backward, multiple laps at 4 ft - side stepping with arm addct/ abdct  yellow hand floats  -  farmer carry with bilat yellow then single hand floats at side, marching forward/ backward. Cues for abd bracing - TrA set with long hollow noodle pull down to thighs x 10; repeated in staggered stance  - decompression on noodle wrapped posteriorly across chest with UE on corner support: cycling (across pool x 2 widths staying close to wall)   Pt requires the buoyancy and hydrostatic pressure of water for support, and to offload joints by unweighting joint load by at least 50 % in navel deep water and by at least 75-80% in chest to neck deep water.  Viscosity of the water is needed for resistance of strengthening. Water current perturbations provides challenge to standing balance requiring increased core activation.   [facility] Adult PT Treatment:                                                DATE: 07/17/23 Pt seen for aquatic therapy today.  Treatment took place in water 3.5-4.75 ft in depth at the [facility] pool. Temp of water was 91.  Pt entered/exited the pool via stairs independently with bilat rail.  - unsupported walking forward/ backward, multiple laps at 4 ft - side stepping with arm addct/ abdct -> with rainbow hand floats -> yellow hand floats  - farmer carry with bilat yellow and single blue hand floats at side, marching forward/ backward  - knee tap to same side/ opp side to rainbow hand floats with marching backward forward - UE on wall:  heel raises x 10; hip add/abd x 10 ( cues to limit height of leg) ; hip flexion /extension x 10 - UE on yellow hand floats:  hip abdct/ addct x 5; hip flex/ ext x 5; heel raises  - TrA set with long hollow noodle pull down to thighs x 10; repeated in staggered stance  - straddling noodle with UE on corner support: cycling   Pt requires the buoyancy and hydrostatic pressure of water for support, and to offload joints by unweighting joint load by at least 50 % in navel deep water and by at least 75-80% in chest to neck deep water.  Viscosity  of the water is needed for resistance of strengthening. Water current perturbations provides challenge to standing balance requiring increased core activation.    PATIENT EDUCATION:  Education details: reacquainting with aquatic therapy Person educated: Patient Education method: Explanation Education comprehension: verbalized understanding  HOME EXERCISE PROGRAM: Aquatic:Has assigned from previous episodes 1st episode: [id number]. 2nd episode: [id number]    ASSESSMENT:  CLINICAL IMPRESSION: Received new order for LBP without sciatica due to LB strain.  Pt reports a few weeks ago she was getting out of a low chair when she her Right LB "popped" causing the flare.  It is better since then although continues to be pain sensitive and limits pt mobility.  She continues to use cane with amb properly in L hand.  She completes exercises submerged well.  Focused on pain management through stretching  as well as engaging LB and rle muscles with minimal load.  Godd response exiting pool with reduction of pain 50%.  Will plan just to contine with original POC as it did take into consideration her LBP.  Will plan on progressing towards land based goals as set and will re set goals as approp and end of current cert.     Initial impression:  Patient is a 58 y.o. f who was seen today for physical therapy evaluation and treatment for R knee oa. Pt is well known to this clinic.  She has had multiple episodes through other [facility] for various dysfunctions (LB and LE).  Is a member here at [facility].  Has had respiratory issues for past year decreasing her ability to exercises/come to gym.  She presents today with right knee pain due to OA.  Her ROM and strength reflect limitation as compared to left.  She is trying to decrease her BMI to be a candidate for TKR but has struggled with this for the past few years.  She will benefit from skilled PT to improve deficits and assign, encourage regular exercise program for  pain and condition management. Plan on combination of land and aquatics for optimal outcome.    OBJECTIVE IMPAIRMENTS: Abnormal gait, decreased endurance, decreased mobility, difficulty walking, decreased ROM, decreased strength, obesity, and pain.   ACTIVITY LIMITATIONS: lifting, standing, squatting, sleeping, stairs, and locomotion level  PARTICIPATION LIMITATIONS: shopping, community activity, and occupation  PERSONAL FACTORS: Behavior pattern, Fitness, Past/current experiences, Time since onset of injury/illness/exacerbation, and 1-2 comorbidities: see pmhx   are also affecting patient's functional outcome.   REHAB POTENTIAL: Good  CLINICAL DECISION MAKING: Stable/uncomplicated  EVALUATION COMPLEXITY: Low   GOALS: Goals reviewed with patient? Yes  SHORT TERM GOALS: Target date: 4/25 Pt will tolerate full aquatic sessions consistently without increase in pain and with improving function to demonstrate good toleration and effectiveness of intervention.  Baseline: Goal status: INITIAL  2.  Pt will report complete reduction in right knee pain while submerged with reduction in pain for a few hours afterward. Baseline: 5/10 Goal status: INITIAL  3.  Pt will report return to regular exercise regimen. Baseline: none Goal status: INITIAL   LONG TERM GOALS: Target date: 09/03/23  Pt to improve on LEFS by at least 9 point to demonstrate statistically significant Improvement in function. Baseline:  Goal status: INITIAL  2.  Pt will be indep with final HEP's (land and aquatic as appropriate) for continued management of condition Baseline:  Goal status: INITIAL  3.  Pt will report decrease in pain by at least 50% for improved toleration to activity/quality of life and to demonstrate improved management of pain. Baseline:  Goal status: INITIAL  4.  Pt will improve on 5 X STS test to <or= 11s  to demonstrate improving functional lower extremity strength, transitional movements, and  balance Baseline: 14.42 Goal status: INITIAL  5.  Pt will improve on Tug test to <or=  11s to demonstrate improvement in lower extremity function, mobility and decreased fall risk. Baseline: 13.87 Goal status: INITIAL     PLAN:  PT FREQUENCY: 2x/week  PT DURATION: 8 weeks 12 visit (extended due to vacation)  PLANNED INTERVENTIONS: 16109- PT Re-evaluation, 97110-Therapeutic exercises, 97530- Therapeutic activity, 97112- Neuromuscular re-education, 97535- Self Care, 60454- Manual therapy, [phone]- Gait training, [phone]- Orthotic Fit/training, [phone]- Aquatic Therapy, [phone]- Ionotophoresis 4mg /ml Dexamethasone , Patient/Family education, Balance training, Stair training, Taping, Dry Needling, Joint mobilization, DME instructions, Cryotherapy, and Moist heat  PLAN  FOR NEXT SESSION: aquatics: le strength; balance and proprioceptive retraining. Aquatic HEP Land: OA right knee strengthening /rom/stretching.  HEP  Adriana Hopping [place]) Kaz Auld MPT 08/06/23 1:02 PM [facility] [address] [place], [place], [address] Phone: [phone]   Fax:  [phone]    Referring diagnosis? Primary osteoarthritis of right knee  Treatment diagnosis? (if different than referring diagnosis) Primary osteoarthritis of right knee  What was this (referring dx) caused by? []  Surgery [x]  Fall [x]  Ongoing issue []  Arthritis []  Other: ____________  Laterality: [x]  Rt []  Lt []  Both  Check all possible CPT codes:  *CHOOSE 10 OR LESS*    See Planned Interventions listed in the Plan section of the Evaluation.

## 2023-08-10 ENCOUNTER — Ambulatory Visit (HOSPITAL_BASED_OUTPATIENT_CLINIC_OR_DEPARTMENT_OTHER): Admitting: Physical Therapy

## 2023-08-10 ENCOUNTER — Encounter (HOSPITAL_BASED_OUTPATIENT_CLINIC_OR_DEPARTMENT_OTHER): Payer: Self-pay | Admitting: Physical Therapy

## 2023-08-10 DIAGNOSIS — G8929 Other chronic pain: Secondary | ICD-10-CM | POA: Diagnosis not present

## 2023-08-10 DIAGNOSIS — R2689 Other abnormalities of gait and mobility: Secondary | ICD-10-CM | POA: Diagnosis not present

## 2023-08-10 DIAGNOSIS — M25561 Pain in right knee: Secondary | ICD-10-CM | POA: Diagnosis not present

## 2023-08-10 DIAGNOSIS — M6281 Muscle weakness (generalized): Secondary | ICD-10-CM

## 2023-08-10 DIAGNOSIS — M545 Low back pain, unspecified: Secondary | ICD-10-CM | POA: Diagnosis not present

## 2023-08-10 DIAGNOSIS — M5459 Other low back pain: Secondary | ICD-10-CM | POA: Diagnosis not present

## 2023-08-10 NOTE — Therapy (Signed)
 OUTPATIENT PHYSICAL THERAPY LOWER EXTREMITY TREATMENT   Patient Name: [name] MRN: [id number] DOB:05/13/1965, 58 y.o., female Today's Date: 08/10/2023  END OF SESSION:  PT End of Session - 08/10/23 1023     Visit Number 4    Number of Visits 12    Date for PT Re-Evaluation 09/04/23    Authorization Type [employer] MCR    PT Start Time 1019    PT Stop Time 1100    PT Time Calculation (min) 41 min    Activity Tolerance Patient tolerated treatment well    Behavior During Therapy WFL for tasks assessed/performed             Past Medical History:  Diagnosis Date   Allergy    Anemia    Anxiety    Arthritis    Blood transfusion without reported diagnosis    Cough    Cough 06/11/2018   Difficulty sleeping 06/11/2018   Diverticulitis with perforation 2010   DVT (deep vein thrombosis) in pregnancy 11/24/2016   DVT, lower extremity (HCC)    Endometrial cancer (HCC)    Enlarged lymph nodes 08/13/2017   Family history of adverse reaction to anesthesia    pts daughter had difficulty awakening following anesthesia, long time to wake up   GERD (gastroesophageal reflux disease)    Headache    Hemoptysis 04/22/2018   History of bronchitis    History of ear infections    Hospital discharge follow-up 03/02/2019   Hx of migraines    Hyperlipidemia    Hypertension    IBS (irritable bowel syndrome)    Kidney infection    Lupus (systemic lupus erythematosus) (HCC) 02/2017   Morbid obesity (HCC)    Neck pain 01/13/2019   Ovarian cancer (HCC) dx'd 01/2015   PONV (postoperative nausea and vomiting)    Port-A-Cath in place    Right side   Portacath in place 09/12/2015   Pre-diabetes    Pyelonephritis    Right knee pain 11/28/2011   Scleroderma (HCC)    Screen for colon cancer 03/02/2019   Slow rate of speech 08/22/2015   Chronic from CVA.  She also has loss of left nasolabial fold and slight left facial droop/small asymmetry on the left side secondary to CVA   UTI (urinary tract  infection) 05/07/2018   Past Surgical History:  Procedure Laterality Date   ABDOMINAL HYSTERECTOMY N/A 03/13/2015   Procedure: TOTAL HYSTERECTOMY ABDOMINAL BILATERAL SALPINGO OOPHORECTOMY RADICAL TUMOR DEBUKING;  Surgeon: Alphonso Aschoff, MD;  Location: [facility];  Service: Gynecology;  Laterality: N/A;   BOWEL RESECTION  03/13/2015   Procedure: SMALL BOWEL RESECTION;  Surgeon: Alphonso Aschoff, MD;  Location: [facility];  Service: Gynecology;;   CESAREAN SECTION  1983 and 1984   COLONOSCOPY WITH PROPOFOL  N/A 07/19/2020   Procedure: COLONOSCOPY WITH PROPOFOL ;  Surgeon: Albertina Hugger, MD;  Location: [facility];  Service: Gastroenterology;  Laterality: N/A;   COLOSTOMY  04/07/2006   COLOSTOMY TAKEDOWN     ESOPHAGOGASTRODUODENOSCOPY (EGD) WITH PROPOFOL  N/A 12/21/2018   Procedure: ESOPHAGOGASTRODUODENOSCOPY (EGD) WITH PROPOFOL ;  Surgeon: Albertina Hugger, MD;  Location: [facility];  Service: Gastroenterology;  Laterality: N/A;   HEMOSTASIS CLIP PLACEMENT  07/19/2020   Procedure: HEMOSTASIS CLIP PLACEMENT;  Surgeon: Albertina Hugger, MD;  Location: [facility];  Service: Gastroenterology;;   IR REMOVAL TUN ACCESS W/ PORT W/O FL MOD SED  12/17/2022   LAPAROTOMY N/A 03/13/2015   Procedure: EXPLORATORY LAPAROTOMY;  Surgeon: Alphonso Aschoff, MD;  Location: [facility];  Service: Gynecology;  Laterality: N/A;   POLYPECTOMY  07/19/2020   Procedure: POLYPECTOMY;  Surgeon: Albertina Hugger, MD;  Location: [facility];  Service: Gastroenterology;;   TONSILLECTOMY AND ADENOIDECTOMY  04/08/1995   VENTRAL HERNIA REPAIR  03/13/2015   Procedure: HERNIA REPAIR VENTRAL ADULT;  Surgeon: Alphonso Aschoff, MD;  Location: [facility];  Service: Gynecology;;   Patient Active Problem List   Diagnosis Date Noted   Upper respiratory tract infection 07/22/2023   Primary osteoarthritis of right knee 06/04/2023   Folic acid  deficiency 05/23/2023   Routine adult health maintenance 02/07/2023   Congestion of upper airway 07/22/2022    Recurrent pneumonia 07/08/2022   Acute respiratory failure with hypoxia (HCC) 06/25/2022   Aspiration into respiratory tract 06/25/2022   Injury of right foot 06/12/2022   Right knee pain 05/26/2022   Multifocal pneumonia 11/19/2021   Acute recurrent maxillary sinusitis 10/31/2021   Viral illness 06/05/2021   Chronic diastolic CHF (congestive heart failure) (HCC) 07/19/2020   Bronchospasm 07/19/2020   Incontinence 03/17/2020   Herpes simplex virus (HSV) infection of buttock 01/05/2020   OSA (obstructive sleep apnea) 07/25/2019   Prediabetes 07/25/2019   Lateral epicondylitis of left elbow 01/13/2019   Normocytic anemia 05/29/2018   Scleroderma (HCC) 04/22/2018   Bloody sputum 04/22/2018   Swelling of foot joint, right 04/08/2018   Positive ANA (antinuclear antibody) 09/23/2016   Eczema 01/27/2016   History of pulmonary embolism 09/12/2015   Chronic anticoagulation 09/06/2015   Chemotherapy-induced peripheral neuropathy (HCC) 06/13/2015   CKD (chronic kidney disease) stage 3, GFR 30-59 ml/min (HCC) 05/12/2015   GERD (gastroesophageal reflux disease) 05/12/2015   Ovarian carcinosarcoma, 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   Endometrial ca (HCC) 03/29/2015   Ventral hernia without obstruction or gangrene    Morbid obesity (HCC) 02/19/2015   Essential hypertension, benign    Stress at home 12/14/2014   Left knee pain 08/04/2013   Seasonal allergies 07/29/2013    PCP: Daleen Dubs MD  REFERRING PROVIDER: Rodgers Clack, DO   REFERRING DIAG: Primary osteoarthritis of right knee     08/06/23: M54.50 (ICD-10-CM) - Acute right-sided low back pain without sciatica   THERAPY DIAG:  Chronic pain of right knee  Muscle weakness (generalized)  Other abnormalities of gait and mobility  Rationale for Evaluation and Treatment: Rehabilitation  ONSET DATE: exacerbated x 2 months  SUBJECTIVE:   SUBJECTIVE STATEMENT: I have been here twice since last session exercising in pool. Felt good  after last session. Pain slightly up because I overdid over w/e    Initial eval:  Cortizone injections x 1 month ago with little relief. Symptoms x >5 yrs. Neuropathies bilat feet due to chemo. Membership here at [facility]. Had 6 episodes of aspiration pneum last year, on wagovia for weight loss to potentially repair a hernia.  On bl thinner.  Hoping to have knee replacements when BMI 40.  PERTINENT HISTORY: Evaluate and treat for right knee bone on bone osteoarthritis. Please include aquatic therapy in treatment plan. PAIN:  Are you having pain? Yes: NPRS scale: current 6/10 Pain location:R knee  Pain description: dull ache/pain Aggravating factors: walking Relieving factors: resting, non-WB  LBP 6/10 Right sided lumbar spine paraspinal area  PRECAUTIONS: None  RED FLAGS: None   WEIGHT BEARING RESTRICTIONS: No  FALLS:  Has patient fallen in last 6 months? Yes. Number of falls 1 tripped over husbands shoe  LIVING ENVIRONMENT: Lives with: lives with their family Lives in: House/apartment Stairs:  2  step ith handrail Has following equipment at home: Single point cane  OCCUPATION: disability  PLOF: Independent  PATIENT GOALS: decreased pain, lose weight  NEXT MD VISIT: end of month  OBJECTIVE:  Note: Objective measures were completed at Evaluation unless otherwise noted.  DIAGNOSTIC FINDINGS: RIGHT KNEE - COMPLETE 4+ VIEW   COMPARISON:  July 27, 2013   FINDINGS: Osteoarthrosis with narrowing of the medial compartment with marginal osteophytes. Femoropatellar osteoarthrosis. Mild narrowing of the lateral and medial compartments of the patella. No fractures   IMPRESSION: Osteoarthrosis.  PATIENT SURVEYS:  LEFS 26/80  COGNITION: Overall cognitive status: Within functional limits for tasks assessed     SENSATION: WFL  EDEMA:  Unable to define due to body habitus  MUSCLE LENGTH: Hamstrings: slight tightness   PALPATION: TTP medial right joint line  knee  LUMBAR ROM:   Active  A/PROM  eval  Flexion FT to below patella  Extension 50% limited  Right lateral flexion wfl  Left lateral flexion wfl  Right rotation   Left rotation    (Blank rows = not tested)   LOWER EXTREMITY strength:  Strength (lbs) Right eval Left eval  Hip flexion 43.5 40.7  Hip extension    Hip abduction 28.1 34.3  Hip adduction    Hip internal rotation    Hip external rotation    Knee flexion 45.7 47.0  Knee extension    Ankle dorsiflexion    Ankle plantarflexion    Ankle inversion    Ankle eversion     (Blank rows = not tested)  LOWER EXTREMITY MMT:  ROM Right eval Left eval  Hip flexion    Hip extension    Hip abduction    Hip adduction    Hip internal rotation    Hip external rotation    Knee flexion 100 112  Knee extension    Ankle dorsiflexion    Ankle plantarflexion    Ankle inversion    Ankle eversion     (Blank rows = not tested) Difficult to assess due to body habitus   FUNCTIONAL TESTS:  5 times sit to stand: 14.42 pool bench Timed up and go (TUG): 13.87 4 stage: passed  GAIT: Distance walked: 500 ft Assistive device utilized: Single point cane Level of assistance: Complete Independence Comments: wide BOS (due to body habitus), knee and hip flex limited just clearing feet from floor. Increased lateral displacement                                                                                                                                TREATMENT  [facility] Adult PT Treatment:                                                DATE: 08/10/23  Pt seen for aquatic therapy  today.  Treatment took place in water 3.5-4.75 ft in depth at the [facility] pool. Temp of water was 91.  Pt entered/exited the pool via stairs independently with bilat rail.  - unsupported walking forward/ backward, multiple laps at 3.6 - side stepping with arm addct/ abdct  yellow hand floats -> side lunges - farmer carry with bilat yellow  then single hand floats at side, marching forward/ backward. Cues for abd bracing - knee tap to same side/ opp side to rainbow hand floats with marching backward forward -L stretch - UE on yellow hand floats:  hip abdct/ addct x 10; hip flex/ ext x 10; heel raises; toe raises x10 - TrA set with long hollow noodle pull down to thighs x 10; repeated in staggered stance  - decompression on noodle wrapped posteriorly across chest with UE on corner support: cycling (across pool x 2 widths staying close to wall); hip add/abd; flex/ext   Pt requires the buoyancy and hydrostatic pressure of water for support, and to offload joints by unweighting joint load by at least 50 % in navel deep water and by at least 75-80% in chest to neck deep water.  Viscosity of the water is needed for resistance of strengthening. Water current perturbations provides challenge to standing balance requiring increased core activation.   [facility] Adult PT Treatment:                                                DATE: 07/17/23 Pt seen for aquatic therapy today.  Treatment took place in water 3.5-4.75 ft in depth at the [facility] pool. Temp of water was 91.  Pt entered/exited the pool via stairs independently with bilat rail.  - unsupported walking forward/ backward, multiple laps at 4 ft - side stepping with arm addct/ abdct -> with rainbow hand floats -> yellow hand floats  - farmer carry with bilat yellow and single blue hand floats at side, marching forward/ backward  - knee tap to same side/ opp side to rainbow hand floats with marching backward forward - UE on wall:  heel raises x 10; hip add/abd x 10 ( cues to limit height of leg) ; hip flexion /extension x 10 - UE on yellow hand floats:  hip abdct/ addct x 5; hip flex/ ext x 5; heel raises  - TrA set with long hollow noodle pull down to thighs x 10; repeated in staggered stance  - straddling noodle with UE on corner support: cycling   Pt requires the buoyancy  and hydrostatic pressure of water for support, and to offload joints by unweighting joint load by at least 50 % in navel deep water and by at least 75-80% in chest to neck deep water.  Viscosity of the water is needed for resistance of strengthening. Water current perturbations provides challenge to standing balance requiring increased core activation.    PATIENT EDUCATION:  Education details: reacquainting with aquatic therapy Person educated: Patient Education method: Explanation Education comprehension: verbalized understanding  HOME EXERCISE PROGRAM: Aquatic:Has assigned from previous episodes 1st episode: [id number]. 2nd episode: [id number]    ASSESSMENT:  CLINICAL IMPRESSION: Good response to last aquatic session.  She is compliant with exercises outside of therapy with membership here reporting more consistent participation in regular exercise. Good toleration to progressive exercises adding in LB stretching. She continues to have difficulty gaining COB  with decompression position.  She reports no pain in knee with submersion. 3 STG met. Goals ongoing      Received new order for LBP without sciatica due to LB strain. Pt reports a few weeks ago she was getting out of a low chair when she her Right LB "popped" causing the flare. It is better since then although continues to be pain sensitive and limits pt mobility. She continues to use cane with amb properly in L hand. She completes exercises submerged well. Focused on pain management through stretching as well as engaging LB and rle muscles with minimal load. Godd response exiting pool with reduction of pain 50%. Will plan just to contine with original POC as it did take into consideration her LBP. Will plan on progressing towards land based goals as set and will re set goals as approp and end of current cert.    OBJECTIVE IMPAIRMENTS: Abnormal gait, decreased endurance, decreased mobility, difficulty walking, decreased ROM, decreased  strength, obesity, and pain.   ACTIVITY LIMITATIONS: lifting, standing, squatting, sleeping, stairs, and locomotion level  PARTICIPATION LIMITATIONS: shopping, community activity, and occupation  PERSONAL FACTORS: Behavior pattern, Fitness, Past/current experiences, Time since onset of injury/illness/exacerbation, and 1-2 comorbidities: see pmhx   are also affecting patient's functional outcome.   REHAB POTENTIAL: Good  CLINICAL DECISION MAKING: Stable/uncomplicated  EVALUATION COMPLEXITY: Low   GOALS: Goals reviewed with patient? Yes  SHORT TERM GOALS: Target date: 4/25 Pt will tolerate full aquatic sessions consistently without increase in pain and with improving function to demonstrate good toleration and effectiveness of intervention.  Baseline: Goal status: Met 08/10/23  2.  Pt will report complete reduction in right knee pain while submerged with reduction in pain for a few hours afterward. Baseline: 5/10 Goal status: Met 08/10/23  3.  Pt will report return to regular exercise regimen. Baseline: none Goal status: MET 08/10/23   LONG TERM GOALS: Target date: 09/03/23  Pt to improve on LEFS by at least 9 point to demonstrate statistically significant Improvement in function. Baseline:  Goal status: INITIAL  2.  Pt will be indep with final HEP's (land and aquatic as appropriate) for continued management of condition Baseline:  Goal status: INITIAL  3.  Pt will report decrease in pain by at least 50% for improved toleration to activity/quality of life and to demonstrate improved management of pain. Baseline:  Goal status: INITIAL  4.  Pt will improve on 5 X STS test to <or= 11s  to demonstrate improving functional lower extremity strength, transitional movements, and balance Baseline: 14.42 Goal status: INITIAL  5.  Pt will improve on Tug test to <or=  11s to demonstrate improvement in lower extremity function, mobility and decreased fall risk. Baseline: 13.87 Goal  status: INITIAL     PLAN:  PT FREQUENCY: 2x/week  PT DURATION: 8 weeks 12 visit (extended due to vacation)  PLANNED INTERVENTIONS: 54098- PT Re-evaluation, 97110-Therapeutic exercises, 97530- Therapeutic activity, 97112- Neuromuscular re-education, 97535- Self Care, 11914- Manual therapy, [phone]- Gait training, [phone]- Orthotic Fit/training, [phone]- Aquatic Therapy, [phone]- Ionotophoresis 4mg /ml Dexamethasone , Patient/Family education, Balance training, Stair training, Taping, Dry Needling, Joint mobilization, DME instructions, Cryotherapy, and Moist heat  PLAN FOR NEXT SESSION: aquatics: le strength; balance and proprioceptive retraining. Aquatic HEP Land: OA right knee strengthening /rom/stretching.  Lb stretching and strengthening  Lucinda Saber) Germani Gavilanes MPT 08/10/23 10:25 AM [facility] [address] [place], [place], [address] Phone: [phone]   Fax:  [phone]    Referring diagnosis?  Primary osteoarthritis of right knee  Treatment diagnosis? (if different than referring diagnosis) Primary osteoarthritis of right knee  What was this (referring dx) caused by? []  Surgery [x]  Fall [x]  Ongoing issue []  Arthritis []  Other: ____________  Laterality: [x]  Rt []  Lt []  Both  Check all possible CPT codes:  *CHOOSE 10 OR LESS*    See Planned Interventions listed in the Plan section of the Evaluation.

## 2023-08-13 ENCOUNTER — Encounter (HOSPITAL_BASED_OUTPATIENT_CLINIC_OR_DEPARTMENT_OTHER): Payer: Self-pay | Admitting: Physical Therapy

## 2023-08-13 ENCOUNTER — Ambulatory Visit (HOSPITAL_BASED_OUTPATIENT_CLINIC_OR_DEPARTMENT_OTHER): Admitting: Physical Therapy

## 2023-08-13 DIAGNOSIS — G8929 Other chronic pain: Secondary | ICD-10-CM

## 2023-08-13 DIAGNOSIS — M545 Low back pain, unspecified: Secondary | ICD-10-CM | POA: Diagnosis not present

## 2023-08-13 DIAGNOSIS — M6281 Muscle weakness (generalized): Secondary | ICD-10-CM

## 2023-08-13 DIAGNOSIS — M5459 Other low back pain: Secondary | ICD-10-CM

## 2023-08-13 DIAGNOSIS — M25561 Pain in right knee: Secondary | ICD-10-CM | POA: Diagnosis not present

## 2023-08-13 DIAGNOSIS — R2689 Other abnormalities of gait and mobility: Secondary | ICD-10-CM | POA: Diagnosis not present

## 2023-08-13 NOTE — Therapy (Signed)
 OUTPATIENT PHYSICAL THERAPY LOWER EXTREMITY TREATMENT   Patient Name: [name] MRN: [id number] DOB:December 15, 1965, 58 y.o., female Today's Date: 08/13/2023  END OF SESSION:  PT End of Session - 08/13/23 0851     Visit Number 5    Number of Visits 12    Date for PT Re-Evaluation 09/04/23    Authorization Type [employer] MCR    PT Start Time 0846    PT Stop Time 0925    PT Time Calculation (min) 39 min    Activity Tolerance Patient tolerated treatment well    Behavior During Therapy [facility] for tasks assessed/performed             Past Medical History:  Diagnosis Date   Allergy    Anemia    Anxiety    Arthritis    Blood transfusion without reported diagnosis    Cough    Cough 06/11/2018   Difficulty sleeping 06/11/2018   Diverticulitis with perforation 2010   DVT (deep vein thrombosis) in pregnancy 11/24/2016   DVT, lower extremity (HCC)    Endometrial cancer (HCC)    Enlarged lymph nodes 08/13/2017   Family history of adverse reaction to anesthesia    pts daughter had difficulty awakening following anesthesia, long time to wake up   GERD (gastroesophageal reflux disease)    Headache    Hemoptysis 04/22/2018   History of bronchitis    History of ear infections    Hospital discharge follow-up 03/02/2019   Hx of migraines    Hyperlipidemia    Hypertension    IBS (irritable bowel syndrome)    Kidney infection    Lupus (systemic lupus erythematosus) (HCC) 02/2017   Morbid obesity (HCC)    Neck pain 01/13/2019   Ovarian cancer (HCC) dx'd 01/2015   PONV (postoperative nausea and vomiting)    Port-A-Cath in place    Right side   Portacath in place 09/12/2015   Pre-diabetes    Pyelonephritis    Right knee pain 11/28/2011   Scleroderma (HCC)    Screen for colon cancer 03/02/2019   Slow rate of speech 08/22/2015   Chronic from CVA.  She also has loss of left nasolabial fold and slight left facial droop/small asymmetry on the left side secondary to CVA   UTI (urinary tract  infection) 05/07/2018   Past Surgical History:  Procedure Laterality Date   ABDOMINAL HYSTERECTOMY N/A 03/13/2015   Procedure: TOTAL HYSTERECTOMY ABDOMINAL BILATERAL SALPINGO OOPHORECTOMY RADICAL TUMOR DEBUKING;  Surgeon: Alphonso Aschoff, MD;  Location: [facility];  Service: Gynecology;  Laterality: N/A;   BOWEL RESECTION  03/13/2015   Procedure: SMALL BOWEL RESECTION;  Surgeon: Alphonso Aschoff, MD;  Location: [facility];  Service: Gynecology;;   CESAREAN SECTION  1983 and 1984   COLONOSCOPY WITH PROPOFOL  N/A 07/19/2020   Procedure: COLONOSCOPY WITH PROPOFOL ;  Surgeon: Albertina Hugger, MD;  Location: [facility];  Service: Gastroenterology;  Laterality: N/A;   COLOSTOMY  04/07/2006   COLOSTOMY TAKEDOWN     ESOPHAGOGASTRODUODENOSCOPY (EGD) WITH PROPOFOL  N/A 12/21/2018   Procedure: ESOPHAGOGASTRODUODENOSCOPY (EGD) WITH PROPOFOL ;  Surgeon: Albertina Hugger, MD;  Location: [facility];  Service: Gastroenterology;  Laterality: N/A;   HEMOSTASIS CLIP PLACEMENT  07/19/2020   Procedure: HEMOSTASIS CLIP PLACEMENT;  Surgeon: Albertina Hugger, MD;  Location: [facility];  Service: Gastroenterology;;   IR REMOVAL TUN ACCESS W/ PORT W/O FL MOD SED  12/17/2022   LAPAROTOMY N/A 03/13/2015   Procedure: EXPLORATORY LAPAROTOMY;  Surgeon: Alphonso Aschoff, MD;  Location: [facility];  Service: Gynecology;  Laterality: N/A;   POLYPECTOMY  07/19/2020   Procedure: POLYPECTOMY;  Surgeon: Albertina Hugger, MD;  Location: [facility];  Service: Gastroenterology;;   TONSILLECTOMY AND ADENOIDECTOMY  04/08/1995   VENTRAL HERNIA REPAIR  03/13/2015   Procedure: HERNIA REPAIR VENTRAL ADULT;  Surgeon: Alphonso Aschoff, MD;  Location: [facility];  Service: Gynecology;;   Patient Active Problem List   Diagnosis Date Noted   Upper respiratory tract infection 07/22/2023   Primary osteoarthritis of right knee 06/04/2023   Folic acid  deficiency 05/23/2023   Routine adult health maintenance 02/07/2023   Congestion of upper airway 07/22/2022    Recurrent pneumonia 07/08/2022   Acute respiratory failure with hypoxia (HCC) 06/25/2022   Aspiration into respiratory tract 06/25/2022   Injury of right foot 06/12/2022   Right knee pain 05/26/2022   Multifocal pneumonia 11/19/2021   Acute recurrent maxillary sinusitis 10/31/2021   Viral illness 06/05/2021   Chronic diastolic CHF (congestive heart failure) (HCC) 07/19/2020   Bronchospasm 07/19/2020   Incontinence 03/17/2020   Herpes simplex virus (HSV) infection of buttock 01/05/2020   OSA (obstructive sleep apnea) 07/25/2019   Prediabetes 07/25/2019   Lateral epicondylitis of left elbow 01/13/2019   Normocytic anemia 05/29/2018   Scleroderma (HCC) 04/22/2018   Bloody sputum 04/22/2018   Swelling of foot joint, right 04/08/2018   Positive ANA (antinuclear antibody) 09/23/2016   Eczema 01/27/2016   History of pulmonary embolism 09/12/2015   Chronic anticoagulation 09/06/2015   Chemotherapy-induced peripheral neuropathy (HCC) 06/13/2015   CKD (chronic kidney disease) stage 3, GFR 30-59 ml/min (HCC) 05/12/2015   GERD (gastroesophageal reflux disease) 05/12/2015   Ovarian carcinosarcoma, 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   Endometrial ca (HCC) 03/29/2015   Ventral hernia without obstruction or gangrene    Morbid obesity (HCC) 02/19/2015   Essential hypertension, benign    Stress at home 12/14/2014   Left knee pain 08/04/2013   Seasonal allergies 07/29/2013    PCP: Daleen Dubs MD  REFERRING PROVIDER: Rodgers Clack, DO   REFERRING DIAG: Primary osteoarthritis of right knee     08/06/23: M54.50 (ICD-10-CM) - Acute right-sided low back pain without sciatica   THERAPY DIAG:  Chronic pain of right knee  Muscle weakness (generalized)  Other abnormalities of gait and mobility  Other low back pain  Rationale for Evaluation and Treatment: Rehabilitation  ONSET DATE: exacerbated x 2 months  SUBJECTIVE:   SUBJECTIVE STATEMENT: Pt reports good relief after leaving pool.  She is  trying to get to pool 2x outside of therapy sessions.     Initial eval:  Cortizone injections x 1 month ago with little relief. Symptoms x >5 yrs. Neuropathies bilat feet due to chemo. Membership here at [facility]. Had 6 episodes of aspiration pneum last year, on wagovia for weight loss to potentially repair a hernia.  On bl thinner.  Hoping to have knee replacements when BMI 40.  PERTINENT HISTORY: Evaluate and treat for right knee bone on bone osteoarthritis. Please include aquatic therapy in treatment plan. PAIN:  Are you having pain? Yes: NPRS scale: current 6/10 Pain location:R knee  Pain description: dull ache/pain Aggravating factors: walking Relieving factors: resting, non-WB  LBP 6/10 Right sided lumbar spine paraspinal area  PRECAUTIONS: None  RED FLAGS: None   WEIGHT BEARING RESTRICTIONS: No  FALLS:  Has patient fallen in last 6 months? Yes. Number of falls 1 tripped over husbands shoe  LIVING ENVIRONMENT: Lives with: lives with their family Lives in: House/apartment Stairs:  2 step ith handrail Has following equipment at home: Single point cane  OCCUPATION: disability  PLOF: Independent  PATIENT GOALS: decreased pain, lose weight  NEXT MD VISIT: end of month  OBJECTIVE:  Note: Objective measures were completed at Evaluation unless otherwise noted.  DIAGNOSTIC FINDINGS: RIGHT KNEE - COMPLETE 4+ VIEW   COMPARISON:  July 27, 2013   FINDINGS: Osteoarthrosis with narrowing of the medial compartment with marginal osteophytes. Femoropatellar osteoarthrosis. Mild narrowing of the lateral and medial compartments of the patella. No fractures   IMPRESSION: Osteoarthrosis.  PATIENT SURVEYS:  LEFS 26/80  COGNITION: Overall cognitive status: Within functional limits for tasks assessed     SENSATION: WFL  EDEMA:  Unable to define due to body habitus  MUSCLE LENGTH: Hamstrings: slight tightness   PALPATION: TTP medial right joint line  knee  LUMBAR ROM:   Active  A/PROM  eval  Flexion FT to below patella  Extension 50% limited  Right lateral flexion wfl  Left lateral flexion wfl  Right rotation   Left rotation    (Blank rows = not tested)   LOWER EXTREMITY strength:  Strength (lbs) Right eval Left eval  Hip flexion 43.5 40.7  Hip extension    Hip abduction 28.1 34.3  Hip adduction    Hip internal rotation    Hip external rotation    Knee flexion 45.7 47.0  Knee extension    Ankle dorsiflexion    Ankle plantarflexion    Ankle inversion    Ankle eversion     (Blank rows = not tested)  LOWER EXTREMITY MMT:  ROM Right eval Left eval  Hip flexion    Hip extension    Hip abduction    Hip adduction    Hip internal rotation    Hip external rotation    Knee flexion 100 112  Knee extension    Ankle dorsiflexion    Ankle plantarflexion    Ankle inversion    Ankle eversion     (Blank rows = not tested) Difficult to assess due to body habitus   FUNCTIONAL TESTS:  5 times sit to stand: 14.42 pool bench Timed up and go (TUG): 13.87 4 stage: passed  08/13/23: 5x STS: 14.5s  GAIT: Distance walked: 500 ft Assistive device utilized: Single point cane Level of assistance: Complete Independence Comments: wide BOS (due to body habitus), knee and hip flex limited just clearing feet from floor. Increased lateral displacement                                                                                                                                TREATMENT  [facility] Adult PT Treatment:                                                DATE: 08/13/23  Pt seen for aquatic therapy today.  Treatment took place in water 3.5-4.75 ft in depth at the [facility] pool. Temp of water was 91.  Pt entered/exited the pool via stairs independently with bilat rail.  - unsupported walking forward/ backward, multiple laps at 4+ ft - side stepping with arm addct/ abdct  yellow hand floats -> sidestep into wide  squat - farmer carry with single yellow then bilat hand floats at side, marching forward/ backward. Cues for abd bracing - discussed engaging core for transitional movements - knee tap to same side/ opp side to rainbow hand floats with marching backward forward - STS at bench in water x 2 -> STS at bench with step under feet x 5 -> STS on 3rd step x 5  - decompression on noodle wrapped anteriorly across chest with UE on corner support: cycling; hip add/abd; flex/ext   [facility] Adult PT Treatment:                                                DATE: 08/10/23  Pt seen for aquatic therapy today.  Treatment took place in water 3.5-4.75 ft in depth at the [facility] pool. Temp of water was 91.  Pt entered/exited the pool via stairs independently with bilat rail.  - unsupported walking forward/ backward, multiple laps at 3.6 - side stepping with arm addct/ abdct  yellow hand floats -> side lunges - farmer carry with bilat yellow then single hand floats at side, marching forward/ backward. Cues for abd bracing - knee tap to same side/ opp side to rainbow hand floats with marching backward forward -L stretch - UE on yellow hand floats:  hip abdct/ addct x 10; hip flex/ ext x 10; heel raises; toe raises x10 - TrA set with long hollow noodle pull down to thighs x 10; repeated in staggered stance  - decompression on noodle wrapped posteriorly across chest with UE on corner support: cycling (across pool x 2 widths staying close to wall); hip add/abd; flex/ext   Pt requires the buoyancy and hydrostatic pressure of water for support, and to offload joints by unweighting joint load by at least 50 % in navel deep water and by at least 75-80% in chest to neck deep water.  Viscosity of the water is needed for resistance of strengthening. Water current perturbations provides challenge to standing balance requiring increased core activation.   [facility] Adult PT Treatment:                                                 DATE: 07/17/23 Pt seen for aquatic therapy today.  Treatment took place in water 3.5-4.75 ft in depth at the [facility] pool. Temp of water was 91.  Pt entered/exited the pool via stairs independently with bilat rail.  - unsupported walking forward/ backward, multiple laps at 4 ft - side stepping with arm addct/ abdct -> with rainbow hand floats -> yellow hand floats  - farmer carry with bilat yellow and single blue hand floats at side, marching forward/ backward  - knee tap to same side/ opp side to rainbow hand floats with marching backward forward - UE on wall:  heel raises x 10; hip add/abd x  10 ( cues to limit height of leg) ; hip flexion /extension x 10 - UE on yellow hand floats:  hip abdct/ addct x 5; hip flex/ ext x 5; heel raises  - TrA set with long hollow noodle pull down to thighs x 10; repeated in staggered stance  - straddling noodle with UE on corner support: cycling   Pt requires the buoyancy and hydrostatic pressure of water for support, and to offload joints by unweighting joint load by at least 50 % in navel deep water and by at least 75-80% in chest to neck deep water.  Viscosity of the water is needed for resistance of strengthening. Water current perturbations provides challenge to standing balance requiring increased core activation.    PATIENT EDUCATION:  Education details: reacquainting with aquatic therapy Person educated: Patient Education method: Explanation Education comprehension: verbalized understanding  HOME EXERCISE PROGRAM: Aquatic:Has assigned from previous episodes 1st episode: [id number]. 2nd episode: [id number]    ASSESSMENT:  CLINICAL IMPRESSION: Pt reporting positive response to aquatic sessions with lasting relief.  5x STS slightly slower than at intake, but she has changed her body mechanics (improved using hips/knees at same time); anticipate improvement with time and practice.    She reports gradual reduction of pain in knee  with submersion.  Progressing towards remaining goals.       Received new order for LBP without sciatica due to LB strain. Pt reports a few weeks ago she was getting out of a low chair when she her Right LB "popped" causing the flare. It is better since then although continues to be pain sensitive and limits pt mobility. She continues to use cane with amb properly in L hand. She completes exercises submerged well. Focused on pain management through stretching as well as engaging LB and rle muscles with minimal load. Godd response exiting pool with reduction of pain 50%. Will plan just to contine with original POC as it did take into consideration her LBP. Will plan on progressing towards land based goals as set and will re set goals as approp and end of current cert.    OBJECTIVE IMPAIRMENTS: Abnormal gait, decreased endurance, decreased mobility, difficulty walking, decreased ROM, decreased strength, obesity, and pain.   ACTIVITY LIMITATIONS: lifting, standing, squatting, sleeping, stairs, and locomotion level  PARTICIPATION LIMITATIONS: shopping, community activity, and occupation  PERSONAL FACTORS: Behavior pattern, Fitness, Past/current experiences, Time since onset of injury/illness/exacerbation, and 1-2 comorbidities: see pmhx  are also affecting patient's functional outcome.   REHAB POTENTIAL: Good  CLINICAL DECISION MAKING: Stable/uncomplicated  EVALUATION COMPLEXITY: Low   GOALS: Goals reviewed with patient? Yes  SHORT TERM GOALS: Target date: 4/25 Pt will tolerate full aquatic sessions consistently without increase in pain and with improving function to demonstrate good toleration and effectiveness of intervention.  Baseline: Goal status: Met 08/10/23  2.  Pt will report complete reduction in right knee pain while submerged with reduction in pain for a few hours afterward. Baseline: 5/10 Goal status: Met 08/10/23  3.  Pt will report return to regular exercise  regimen. Baseline: none Goal status: MET 08/10/23   LONG TERM GOALS: Target date: 09/03/23  Pt to improve on LEFS by at least 9 point to demonstrate statistically significant Improvement in function. Baseline:  Goal status: INITIAL  2.  Pt will be indep with final HEP's (land and aquatic as appropriate) for continued management of condition Baseline:  Goal status: INITIAL  3.  Pt will report decrease in pain by at least 50% for  improved toleration to activity/quality of life and to demonstrate improved management of pain. Baseline:  Goal status: INITIAL  4.  Pt will improve on 5 X STS test to <or= 11s  to demonstrate improving functional lower extremity strength, transitional movements, and balance Baseline: see above  Goal status: IN PROGRESS   5.  Pt will improve on Tug test to <or=  11s to demonstrate improvement in lower extremity function, mobility and decreased fall risk. Baseline: 13.87 Goal status: INITIAL     PLAN:  PT FREQUENCY: 2x/week  PT DURATION: 8 weeks 12 visit (extended due to vacation)  PLANNED INTERVENTIONS: 16109- PT Re-evaluation, 97110-Therapeutic exercises, 97530- Therapeutic activity, 97112- Neuromuscular re-education, 97535- Self Care, 60454- Manual therapy, [phone]- Gait training, [phone]- Orthotic Fit/training, [phone]- Aquatic Therapy, [phone]- Ionotophoresis 4mg /ml Dexamethasone , Patient/Family education, Balance training, Stair training, Taping, Dry Needling, Joint mobilization, DME instructions, Cryotherapy, and Moist heat  PLAN FOR NEXT SESSION: aquatics: le strength; balance and proprioceptive retraining. Aquatic HEP Land: OA right knee strengthening /rom/stretching.  Lb stretching and strengthening  Almedia Jacobsen, PTA 08/13/23 3:44 PM [facility] [address] [place], [place], [address] Phone: [phone]   Fax:  [phone]     Referring diagnosis? Primary osteoarthritis of right  knee  Treatment diagnosis? (if different than referring diagnosis) Primary osteoarthritis of right knee  What was this (referring dx) caused by? []  Surgery [x]  Fall [x]  Ongoing issue []  Arthritis []  Other: ____________  Laterality: [x]  Rt []  Lt []  Both  Check all possible CPT codes:  *CHOOSE 10 OR LESS*    See Planned Interventions listed in the Plan section of the Evaluation.

## 2023-08-17 ENCOUNTER — Encounter (HOSPITAL_BASED_OUTPATIENT_CLINIC_OR_DEPARTMENT_OTHER): Payer: Self-pay | Admitting: Physical Therapy

## 2023-08-17 ENCOUNTER — Ambulatory Visit (HOSPITAL_BASED_OUTPATIENT_CLINIC_OR_DEPARTMENT_OTHER): Admitting: Physical Therapy

## 2023-08-17 DIAGNOSIS — G8929 Other chronic pain: Secondary | ICD-10-CM | POA: Diagnosis not present

## 2023-08-17 DIAGNOSIS — R2689 Other abnormalities of gait and mobility: Secondary | ICD-10-CM

## 2023-08-17 DIAGNOSIS — M5459 Other low back pain: Secondary | ICD-10-CM | POA: Diagnosis not present

## 2023-08-17 DIAGNOSIS — M25561 Pain in right knee: Secondary | ICD-10-CM | POA: Diagnosis not present

## 2023-08-17 DIAGNOSIS — M6281 Muscle weakness (generalized): Secondary | ICD-10-CM | POA: Diagnosis not present

## 2023-08-17 DIAGNOSIS — M545 Low back pain, unspecified: Secondary | ICD-10-CM | POA: Diagnosis not present

## 2023-08-17 NOTE — Therapy (Signed)
 OUTPATIENT PHYSICAL THERAPY LOWER EXTREMITY TREATMENT   Patient Name: [name] MRN: [id number] DOB:1966/02/20, 58 y.o., female Today's Date: 08/17/2023  END OF SESSION:  PT End of Session - 08/17/23 1140     Visit Number 6    Number of Visits 12    Date for PT Re-Evaluation 09/04/23    Authorization Type [employer] MCR    PT Start Time 1100    PT Stop Time 1140    PT Time Calculation (min) 40 min    Activity Tolerance Patient tolerated treatment well    Behavior During Therapy WFL for tasks assessed/performed              Past Medical History:  Diagnosis Date   Allergy    Anemia    Anxiety    Arthritis    Blood transfusion without reported diagnosis    Cough    Cough 06/11/2018   Difficulty sleeping 06/11/2018   Diverticulitis with perforation 2010   DVT (deep vein thrombosis) in pregnancy 11/24/2016   DVT, lower extremity (HCC)    Endometrial cancer (HCC)    Enlarged lymph nodes 08/13/2017   Family history of adverse reaction to anesthesia    pts daughter had difficulty awakening following anesthesia, long time to wake up   GERD (gastroesophageal reflux disease)    Headache    Hemoptysis 04/22/2018   History of bronchitis    History of ear infections    Hospital discharge follow-up 03/02/2019   Hx of migraines    Hyperlipidemia    Hypertension    IBS (irritable bowel syndrome)    Kidney infection    Lupus (systemic lupus erythematosus) (HCC) 02/2017   Morbid obesity (HCC)    Neck pain 01/13/2019   Ovarian cancer (HCC) dx'd 01/2015   PONV (postoperative nausea and vomiting)    Port-A-Cath in place    Right side   Portacath in place 09/12/2015   Pre-diabetes    Pyelonephritis    Right knee pain 11/28/2011   Scleroderma (HCC)    Screen for colon cancer 03/02/2019   Slow rate of speech 08/22/2015   Chronic from CVA.  She also has loss of left nasolabial fold and slight left facial droop/small asymmetry on the left side secondary to CVA   UTI (urinary tract  infection) 05/07/2018   Past Surgical History:  Procedure Laterality Date   ABDOMINAL HYSTERECTOMY N/A 03/13/2015   Procedure: TOTAL HYSTERECTOMY ABDOMINAL BILATERAL SALPINGO OOPHORECTOMY RADICAL TUMOR DEBUKING;  Surgeon: Alphonso Aschoff, MD;  Location: [facility];  Service: Gynecology;  Laterality: N/A;   BOWEL RESECTION  03/13/2015   Procedure: SMALL BOWEL RESECTION;  Surgeon: Alphonso Aschoff, MD;  Location: [facility];  Service: Gynecology;;   CESAREAN SECTION  1983 and 1984   COLONOSCOPY WITH PROPOFOL  N/A 07/19/2020   Procedure: COLONOSCOPY WITH PROPOFOL ;  Surgeon: Albertina Hugger, MD;  Location: [facility];  Service: Gastroenterology;  Laterality: N/A;   COLOSTOMY  04/07/2006   COLOSTOMY TAKEDOWN     ESOPHAGOGASTRODUODENOSCOPY (EGD) WITH PROPOFOL  N/A 12/21/2018   Procedure: ESOPHAGOGASTRODUODENOSCOPY (EGD) WITH PROPOFOL ;  Surgeon: Albertina Hugger, MD;  Location: [facility];  Service: Gastroenterology;  Laterality: N/A;   HEMOSTASIS CLIP PLACEMENT  07/19/2020   Procedure: HEMOSTASIS CLIP PLACEMENT;  Surgeon: Albertina Hugger, MD;  Location: [facility];  Service: Gastroenterology;;   IR REMOVAL TUN ACCESS W/ PORT W/O FL MOD SED  12/17/2022   LAPAROTOMY N/A 03/13/2015   Procedure: EXPLORATORY LAPAROTOMY;  Surgeon: Alphonso Aschoff, MD;  Location: [facility];  Service: Gynecology;  Laterality: N/A;   POLYPECTOMY  07/19/2020   Procedure: POLYPECTOMY;  Surgeon: Albertina Hugger, MD;  Location: [facility];  Service: Gastroenterology;;   TONSILLECTOMY AND ADENOIDECTOMY  04/08/1995   VENTRAL HERNIA REPAIR  03/13/2015   Procedure: HERNIA REPAIR VENTRAL ADULT;  Surgeon: Alphonso Aschoff, MD;  Location: [facility];  Service: Gynecology;;   Patient Active Problem List   Diagnosis Date Noted   Upper respiratory tract infection 07/22/2023   Primary osteoarthritis of right knee 06/04/2023   Folic acid  deficiency 05/23/2023   Routine adult health maintenance 02/07/2023   Congestion of upper airway 07/22/2022    Recurrent pneumonia 07/08/2022   Acute respiratory failure with hypoxia (HCC) 06/25/2022   Aspiration into respiratory tract 06/25/2022   Injury of right foot 06/12/2022   Right knee pain 05/26/2022   Multifocal pneumonia 11/19/2021   Acute recurrent maxillary sinusitis 10/31/2021   Viral illness 06/05/2021   Chronic diastolic CHF (congestive heart failure) (HCC) 07/19/2020   Bronchospasm 07/19/2020   Incontinence 03/17/2020   Herpes simplex virus (HSV) infection of buttock 01/05/2020   OSA (obstructive sleep apnea) 07/25/2019   Prediabetes 07/25/2019   Lateral epicondylitis of left elbow 01/13/2019   Normocytic anemia 05/29/2018   Scleroderma (HCC) 04/22/2018   Bloody sputum 04/22/2018   Swelling of foot joint, right 04/08/2018   Positive ANA (antinuclear antibody) 09/23/2016   Eczema 01/27/2016   History of pulmonary embolism 09/12/2015   Chronic anticoagulation 09/06/2015   Chemotherapy-induced peripheral neuropathy (HCC) 06/13/2015   CKD (chronic kidney disease) stage 3, GFR 30-59 ml/min (HCC) 05/12/2015   GERD (gastroesophageal reflux disease) 05/12/2015   Ovarian carcinosarcoma, 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   Endometrial ca (HCC) 03/29/2015   Ventral hernia without obstruction or gangrene    Morbid obesity (HCC) 02/19/2015   Essential hypertension, benign    Stress at home 12/14/2014   Left knee pain 08/04/2013   Seasonal allergies 07/29/2013    PCP: Daleen Dubs MD  REFERRING PROVIDER: Rodgers Clack, DO   REFERRING DIAG: Primary osteoarthritis of right knee     08/06/23: M54.50 (ICD-10-CM) - Acute right-sided low back pain without sciatica   THERAPY DIAG:  Chronic pain of right knee  Muscle weakness (generalized)  Other abnormalities of gait and mobility  Other low back pain  Rationale for Evaluation and Treatment: Rehabilitation  ONSET DATE: exacerbated x 2 months  SUBJECTIVE:   SUBJECTIVE STATEMENT: Pt states she is doing well in the water. She has  been coming in regularly. The pool does improve her pain.   Initial eval:  Cortizone injections x 1 month ago with little relief. Symptoms x >5 yrs. Neuropathies bilat feet due to chemo. Membership here at [facility]. Had 6 episodes of aspiration pneum last year, on wagovia for weight loss to potentially repair a hernia.  On bl thinner.  Hoping to have knee replacements when BMI 40.  PERTINENT HISTORY: Evaluate and treat for right knee bone on bone osteoarthritis. Please include aquatic therapy in treatment plan. PAIN:  Are you having pain? Yes: NPRS scale: current 4/10 Pain location:R knee and low back Pain description: dull ache/pain Aggravating factors: walking Relieving factors: resting, non-WB  LBP 6/10 Right sided lumbar spine paraspinal area  PRECAUTIONS: None  RED FLAGS: None   WEIGHT BEARING RESTRICTIONS: No  FALLS:  Has patient fallen in last 6 months? Yes. Number of falls 1 tripped over husbands shoe  LIVING ENVIRONMENT: Lives with: lives with their family Lives in: House/apartment  Stairs: 2 step ith handrail Has following equipment at home: Single point cane  OCCUPATION: disability  PLOF: Independent  PATIENT GOALS: decreased pain, lose weight  NEXT MD VISIT: end of month  OBJECTIVE:  Note: Objective measures were completed at Evaluation unless otherwise noted.  DIAGNOSTIC FINDINGS: RIGHT KNEE - COMPLETE 4+ VIEW   COMPARISON:  July 27, 2013   FINDINGS: Osteoarthrosis with narrowing of the medial compartment with marginal osteophytes. Femoropatellar osteoarthrosis. Mild narrowing of the lateral and medial compartments of the patella. No fractures   IMPRESSION: Osteoarthrosis.  PATIENT SURVEYS:  LEFS 26/80  COGNITION: Overall cognitive status: Within functional limits for tasks assessed     SENSATION: WFL  EDEMA:  Unable to define due to body habitus  MUSCLE LENGTH: Hamstrings: slight tightness   PALPATION: TTP medial right joint line  knee  LUMBAR ROM:   Active  A/PROM  eval  Flexion FT to below patella  Extension 50% limited  Right lateral flexion wfl  Left lateral flexion wfl  Right rotation   Left rotation    (Blank rows = not tested)   LOWER EXTREMITY strength:  Strength (lbs) Right eval Left eval  Hip flexion 43.5 40.7  Hip extension    Hip abduction 28.1 34.3  Hip adduction    Hip internal rotation    Hip external rotation    Knee flexion 45.7 47.0  Knee extension    Ankle dorsiflexion    Ankle plantarflexion    Ankle inversion    Ankle eversion     (Blank rows = not tested)  LOWER EXTREMITY MMT:  ROM Right eval Left eval  Hip flexion    Hip extension    Hip abduction    Hip adduction    Hip internal rotation    Hip external rotation    Knee flexion 100 112  Knee extension    Ankle dorsiflexion    Ankle plantarflexion    Ankle inversion    Ankle eversion     (Blank rows = not tested) Difficult to assess due to body habitus   FUNCTIONAL TESTS:  5 times sit to stand: 14.42 pool bench Timed up and go (TUG): 13.87 4 stage: passed  08/13/23: 5x STS: 14.5s  GAIT: Distance walked: 500 ft Assistive device utilized: Single point cane Level of assistance: Complete Independence Comments: wide BOS (due to body habitus), knee and hip flex limited just clearing feet from floor. Increased lateral displacement                                                                                                                                TREATMENT  5/12  Nustep Lvl 5 5 min 5lb LAQ 3x10 5lb STS 3x8 Jefferson curl 3x5 Standing sidebend 10x each Green band TKE 2x10 Red band paloff 2x10   [facility] Adult PT Treatment:  DATE: 08/13/23  Pt seen for aquatic therapy today.  Treatment took place in water 3.5-4.75 ft in depth at the [facility] pool. Temp of water was 91.  Pt entered/exited the pool via stairs independently with bilat  rail.  - unsupported walking forward/ backward, multiple laps at 4+ ft - side stepping with arm addct/ abdct  yellow hand floats -> sidestep into wide squat - farmer carry with single yellow then bilat hand floats at side, marching forward/ backward. Cues for abd bracing - discussed engaging core for transitional movements - knee tap to same side/ opp side to rainbow hand floats with marching backward forward - STS at bench in water x 2 -> STS at bench with step under feet x 5 -> STS on 3rd step x 5  - decompression on noodle wrapped anteriorly across chest with UE on corner support: cycling; hip add/abd; flex/ext   [facility] Adult PT Treatment:                                                DATE: 08/10/23  Pt seen for aquatic therapy today.  Treatment took place in water 3.5-4.75 ft in depth at the [facility] pool. Temp of water was 91.  Pt entered/exited the pool via stairs independently with bilat rail.  - unsupported walking forward/ backward, multiple laps at 3.6 - side stepping with arm addct/ abdct  yellow hand floats -> side lunges - farmer carry with bilat yellow then single hand floats at side, marching forward/ backward. Cues for abd bracing - knee tap to same side/ opp side to rainbow hand floats with marching backward forward -L stretch - UE on yellow hand floats:  hip abdct/ addct x 10; hip flex/ ext x 10; heel raises; toe raises x10 - TrA set with long hollow noodle pull down to thighs x 10; repeated in staggered stance  - decompression on noodle wrapped posteriorly across chest with UE on corner support: cycling (across pool x 2 widths staying close to wall); hip add/abd; flex/ext   Pt requires the buoyancy and hydrostatic pressure of water for support, and to offload joints by unweighting joint load by at least 50 % in navel deep water and by at least 75-80% in chest to neck deep water.  Viscosity of the water is needed for resistance of strengthening. Water current  perturbations provides challenge to standing balance requiring increased core activation.   [facility] Adult PT Treatment:                                                DATE: 07/17/23 Pt seen for aquatic therapy today.  Treatment took place in water 3.5-4.75 ft in depth at the [facility] pool. Temp of water was 91.  Pt entered/exited the pool via stairs independently with bilat rail.  - unsupported walking forward/ backward, multiple laps at 4 ft - side stepping with arm addct/ abdct -> with rainbow hand floats -> yellow hand floats  - farmer carry with bilat yellow and single blue hand floats at side, marching forward/ backward  - knee tap to same side/ opp side to rainbow hand floats with marching backward forward - UE on wall:  heel raises x 10;  hip add/abd x 10 ( cues to limit height of leg) ; hip flexion /extension x 10 - UE on yellow hand floats:  hip abdct/ addct x 5; hip flex/ ext x 5; heel raises  - TrA set with long hollow noodle pull down to thighs x 10; repeated in staggered stance  - straddling noodle with UE on corner support: cycling   Pt requires the buoyancy and hydrostatic pressure of water for support, and to offload joints by unweighting joint load by at least 50 % in navel deep water and by at least 75-80% in chest to neck deep water.  Viscosity of the water is needed for resistance of strengthening. Water current perturbations provides challenge to standing balance requiring increased core activation.    PATIENT EDUCATION:  Education details:anatomy, exercise progression, DOMS expectations, muscle firing,  envelope of function, HEP, POC  Person educated: Patient Education method: Explanation Education comprehension: verbalized understanding  HOME EXERCISE PROGRAM: Aquatic:Has assigned from previous episodes 1st episode: [id number]. 2nd episode: [id number]    ASSESSMENT:  CLINICAL IMPRESSION: Pt able to start land based exercise today with minimal pain increase.  Once weight reduced, pt with only tightness into the low back during exercise. Knee strengthening without issues other than poor quad contraction and weakness. Continue with land based strength as tolerated for lumbopelvic region and knee.    Previous:  Received new order for LBP without sciatica due to LB strain. Pt reports a few weeks ago she was getting out of a low chair when she her Right LB "popped" causing the flare. It is better since then although continues to be pain sensitive and limits pt mobility. She continues to use cane with amb properly in L hand. She completes exercises submerged well. Focused on pain management through stretching as well as engaging LB and rle muscles with minimal load. Godd response exiting pool with reduction of pain 50%. Will plan just to contine with original POC as it did take into consideration her LBP. Will plan on progressing towards land based goals as set and will re set goals as approp and end of current cert.    OBJECTIVE IMPAIRMENTS: Abnormal gait, decreased endurance, decreased mobility, difficulty walking, decreased ROM, decreased strength, obesity, and pain.   ACTIVITY LIMITATIONS: lifting, standing, squatting, sleeping, stairs, and locomotion level  PARTICIPATION LIMITATIONS: shopping, community activity, and occupation  PERSONAL FACTORS: Behavior pattern, Fitness, Past/current experiences, Time since onset of injury/illness/exacerbation, and 1-2 comorbidities: see pmhx  are also affecting patient's functional outcome.   REHAB POTENTIAL: Good  CLINICAL DECISION MAKING: Stable/uncomplicated  EVALUATION COMPLEXITY: Low   GOALS: Goals reviewed with patient? Yes  SHORT TERM GOALS: Target date: 4/25 Pt will tolerate full aquatic sessions consistently without increase in pain and with improving function to demonstrate good toleration and effectiveness of intervention.  Baseline: Goal status: Met 08/10/23  2.  Pt will report complete reduction  in right knee pain while submerged with reduction in pain for a few hours afterward. Baseline: 5/10 Goal status: Met 08/10/23  3.  Pt will report return to regular exercise regimen. Baseline: none Goal status: MET 08/10/23   LONG TERM GOALS: Target date: 09/03/23  Pt to improve on LEFS by at least 9 point to demonstrate statistically significant Improvement in function. Baseline:  Goal status: INITIAL  2.  Pt will be indep with final HEP's (land and aquatic as appropriate) for continued management of condition Baseline:  Goal status: INITIAL  3.  Pt will report decrease in pain by  at least 50% for improved toleration to activity/quality of life and to demonstrate improved management of pain. Baseline:  Goal status: INITIAL  4.  Pt will improve on 5 X STS test to <or= 11s  to demonstrate improving functional lower extremity strength, transitional movements, and balance Baseline: see above  Goal status: IN PROGRESS   5.  Pt will improve on Tug test to <or=  11s to demonstrate improvement in lower extremity function, mobility and decreased fall risk. Baseline: 13.87 Goal status: INITIAL     PLAN:  PT FREQUENCY: 2x/week  PT DURATION: 8 weeks 12 visit (extended due to vacation)  PLANNED INTERVENTIONS: [id number]- PT Re-evaluation, 97110-Therapeutic exercises, 97530- Therapeutic activity, 97112- Neuromuscular re-education, 97535- Self Care, [id number]- Manual therapy, [phone]- Gait training, [phone]- Orthotic Fit/training, [phone]- Aquatic Therapy, [phone]- Ionotophoresis 4mg /ml Dexamethasone , Patient/Family education, Balance training, Stair training, Taping, Dry Needling, Joint mobilization, DME instructions, Cryotherapy, and Moist heat  PLAN FOR NEXT SESSION: aquatics: le strength; balance and proprioceptive retraining. Aquatic HEP Land: OA right knee strengthening /rom/stretching.  Lb stretching and strengthening  Silver Dross PT, DPT 08/17/23 11:46 AM      Referring diagnosis? Primary  osteoarthritis of right knee  Treatment diagnosis? (if different than referring diagnosis) Primary osteoarthritis of right knee  What was this (referring dx) caused by? []  Surgery [x]  Fall [x]  Ongoing issue []  Arthritis []  Other: ____________  Laterality: [x]  Rt []  Lt []  Both  Check all possible CPT codes:  *CHOOSE 10 OR LESS*    See Planned Interventions listed in the Plan section of the Evaluation.

## 2023-08-20 ENCOUNTER — Telehealth: Payer: Self-pay

## 2023-08-20 ENCOUNTER — Ambulatory Visit (HOSPITAL_BASED_OUTPATIENT_CLINIC_OR_DEPARTMENT_OTHER): Admitting: Physical Therapy

## 2023-08-20 ENCOUNTER — Encounter (HOSPITAL_BASED_OUTPATIENT_CLINIC_OR_DEPARTMENT_OTHER): Payer: Self-pay | Admitting: Physical Therapy

## 2023-08-20 DIAGNOSIS — M6281 Muscle weakness (generalized): Secondary | ICD-10-CM

## 2023-08-20 DIAGNOSIS — M545 Low back pain, unspecified: Secondary | ICD-10-CM | POA: Diagnosis not present

## 2023-08-20 DIAGNOSIS — R2689 Other abnormalities of gait and mobility: Secondary | ICD-10-CM | POA: Diagnosis not present

## 2023-08-20 DIAGNOSIS — M25561 Pain in right knee: Secondary | ICD-10-CM | POA: Diagnosis not present

## 2023-08-20 DIAGNOSIS — M5459 Other low back pain: Secondary | ICD-10-CM

## 2023-08-20 DIAGNOSIS — G8929 Other chronic pain: Secondary | ICD-10-CM | POA: Diagnosis not present

## 2023-08-20 NOTE — Telephone Encounter (Signed)
 Patient calls nurse line requesting handicap placard renewal.  She reports her current handicap placard expires on 5/25. She reports she is still in physical therapy and reports she is still using her oxygen tank.   Advised will forward to PCP for advisement.   Will print placard and place in PCP box.

## 2023-08-20 NOTE — Telephone Encounter (Signed)
Form completed, will return to [facility] RN team Alison Senn J Geno Sydnor, MD  

## 2023-08-20 NOTE — Therapy (Signed)
 OUTPATIENT PHYSICAL THERAPY LOWER EXTREMITY TREATMENT   Patient Name: [name] MRN: [id number] DOB:04/06/1966, 58 y.o., female Today's Date: 08/20/2023  END OF SESSION:  PT End of Session - 08/20/23 0938     Visit Number 7    Number of Visits 12    Date for PT Re-Evaluation 09/04/23    Authorization Type [employer] MCR    PT Start Time 0932    PT Stop Time 1013    PT Time Calculation (min) 41 min    Activity Tolerance Patient tolerated treatment well    Behavior During Therapy WFL for tasks assessed/performed               Past Medical History:  Diagnosis Date   Allergy    Anemia    Anxiety    Arthritis    Blood transfusion without reported diagnosis    Cough    Cough 06/11/2018   Difficulty sleeping 06/11/2018   Diverticulitis with perforation 2010   DVT (deep vein thrombosis) in pregnancy 11/24/2016   DVT, lower extremity (HCC)    Endometrial cancer (HCC)    Enlarged lymph nodes 08/13/2017   Family history of adverse reaction to anesthesia    pts daughter had difficulty awakening following anesthesia, long time to wake up   GERD (gastroesophageal reflux disease)    Headache    Hemoptysis 04/22/2018   History of bronchitis    History of ear infections    Hospital discharge follow-up 03/02/2019   Hx of migraines    Hyperlipidemia    Hypertension    IBS (irritable bowel syndrome)    Kidney infection    Lupus (systemic lupus erythematosus) (HCC) 02/2017   Morbid obesity (HCC)    Neck pain 01/13/2019   Ovarian cancer (HCC) dx'd 01/2015   PONV (postoperative nausea and vomiting)    Port-A-Cath in place    Right side   Portacath in place 09/12/2015   Pre-diabetes    Pyelonephritis    Right knee pain 11/28/2011   Scleroderma (HCC)    Screen for colon cancer 03/02/2019   Slow rate of speech 08/22/2015   Chronic from CVA.  She also has loss of left nasolabial fold and slight left facial droop/small asymmetry on the left side secondary to CVA   UTI (urinary  tract infection) 05/07/2018   Past Surgical History:  Procedure Laterality Date   ABDOMINAL HYSTERECTOMY N/A 03/13/2015   Procedure: TOTAL HYSTERECTOMY ABDOMINAL BILATERAL SALPINGO OOPHORECTOMY RADICAL TUMOR DEBUKING;  Surgeon: Alphonso Aschoff, MD;  Location: [facility];  Service: Gynecology;  Laterality: N/A;   BOWEL RESECTION  03/13/2015   Procedure: SMALL BOWEL RESECTION;  Surgeon: Alphonso Aschoff, MD;  Location: [facility];  Service: Gynecology;;   CESAREAN SECTION  1983 and 1984   COLONOSCOPY WITH PROPOFOL  N/A 07/19/2020   Procedure: COLONOSCOPY WITH PROPOFOL ;  Surgeon: Albertina Hugger, MD;  Location: [facility];  Service: Gastroenterology;  Laterality: N/A;   COLOSTOMY  04/07/2006   COLOSTOMY TAKEDOWN     ESOPHAGOGASTRODUODENOSCOPY (EGD) WITH PROPOFOL  N/A 12/21/2018   Procedure: ESOPHAGOGASTRODUODENOSCOPY (EGD) WITH PROPOFOL ;  Surgeon: Albertina Hugger, MD;  Location: [facility];  Service: Gastroenterology;  Laterality: N/A;   HEMOSTASIS CLIP PLACEMENT  07/19/2020   Procedure: HEMOSTASIS CLIP PLACEMENT;  Surgeon: Albertina Hugger, MD;  Location: [facility];  Service: Gastroenterology;;   IR REMOVAL TUN ACCESS W/ PORT W/O FL MOD SED  12/17/2022   LAPAROTOMY N/A 03/13/2015   Procedure: EXPLORATORY LAPAROTOMY;  Surgeon: Alphonso Aschoff,  MD;  Location: [facility];  Service: Gynecology;  Laterality: N/A;   POLYPECTOMY  07/19/2020   Procedure: POLYPECTOMY;  Surgeon: Albertina Hugger, MD;  Location: [facility];  Service: Gastroenterology;;   TONSILLECTOMY AND ADENOIDECTOMY  04/08/1995   VENTRAL HERNIA REPAIR  03/13/2015   Procedure: HERNIA REPAIR VENTRAL ADULT;  Surgeon: Alphonso Aschoff, MD;  Location: [facility];  Service: Gynecology;;   Patient Active Problem List   Diagnosis Date Noted   Upper respiratory tract infection 07/22/2023   Primary osteoarthritis of right knee 06/04/2023   Folic acid  deficiency 05/23/2023   Routine adult health maintenance 02/07/2023   Congestion of upper airway 07/22/2022    Recurrent pneumonia 07/08/2022   Acute respiratory failure with hypoxia (HCC) 06/25/2022   Aspiration into respiratory tract 06/25/2022   Injury of right foot 06/12/2022   Right knee pain 05/26/2022   Multifocal pneumonia 11/19/2021   Acute recurrent maxillary sinusitis 10/31/2021   Viral illness 06/05/2021   Chronic diastolic CHF (congestive heart failure) (HCC) 07/19/2020   Bronchospasm 07/19/2020   Incontinence 03/17/2020   Herpes simplex virus (HSV) infection of buttock 01/05/2020   OSA (obstructive sleep apnea) 07/25/2019   Prediabetes 07/25/2019   Lateral epicondylitis of left elbow 01/13/2019   Normocytic anemia 05/29/2018   Scleroderma (HCC) 04/22/2018   Bloody sputum 04/22/2018   Swelling of foot joint, right 04/08/2018   Positive ANA (antinuclear antibody) 09/23/2016   Eczema 01/27/2016   History of pulmonary embolism 09/12/2015   Chronic anticoagulation 09/06/2015   Chemotherapy-induced peripheral neuropathy (HCC) 06/13/2015   CKD (chronic kidney disease) stage 3, GFR 30-59 ml/min (HCC) 05/12/2015   GERD (gastroesophageal reflux disease) 05/12/2015   Ovarian carcinosarcoma, 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   Endometrial ca (HCC) 03/29/2015   Ventral hernia without obstruction or gangrene    Morbid obesity (HCC) 02/19/2015   Essential hypertension, benign    Stress at home 12/14/2014   Left knee pain 08/04/2013   Seasonal allergies 07/29/2013    PCP: Daleen Dubs MD  REFERRING PROVIDER: Rodgers Clack, DO   REFERRING DIAG: Primary osteoarthritis of right knee     08/06/23: M54.50 (ICD-10-CM) - Acute right-sided low back pain without sciatica   THERAPY DIAG:  Chronic pain of right knee  Muscle weakness (generalized)  Other abnormalities of gait and mobility  Other low back pain  Rationale for Evaluation and Treatment: Rehabilitation  ONSET DATE: exacerbated x 2 months  SUBJECTIVE:   SUBJECTIVE STATEMENT: Pt states she has more back pain today due to recent  increase in bending and lifting with renovations.  Initial eval:  Cortizone injections x 1 month ago with little relief. Symptoms x >5 yrs. Neuropathies bilat feet due to chemo. Membership here at [facility]. Had 6 episodes of aspiration pneum last year, on wagovia for weight loss to potentially repair a hernia.  On bl thinner.  Hoping to have knee replacements when BMI 40.  PERTINENT HISTORY: Evaluate and treat for right knee bone on bone osteoarthritis. Please include aquatic therapy in treatment plan. PAIN:  Are you having pain? Yes: NPRS scale: current 5/10 Pain location:R knee and low back Pain description: dull ache/pain Aggravating factors: walking Relieving factors: resting, non-WB  LBP 6/10 Right sided lumbar spine paraspinal area  PRECAUTIONS: None  RED FLAGS: None   WEIGHT BEARING RESTRICTIONS: No  FALLS:  Has patient fallen in last 6 months? Yes. Number of falls 1 tripped over husbands shoe  LIVING ENVIRONMENT: Lives with: lives with their family Lives in: House/apartment Stairs: 2  step ith handrail Has following equipment at home: Single point cane  OCCUPATION: disability  PLOF: Independent  PATIENT GOALS: decreased pain, lose weight  NEXT MD VISIT: end of month  OBJECTIVE:  Note: Objective measures were completed at Evaluation unless otherwise noted.  DIAGNOSTIC FINDINGS: RIGHT KNEE - COMPLETE 4+ VIEW   COMPARISON:  July 27, 2013   FINDINGS: Osteoarthrosis with narrowing of the medial compartment with marginal osteophytes. Femoropatellar osteoarthrosis. Mild narrowing of the lateral and medial compartments of the patella. No fractures   IMPRESSION: Osteoarthrosis.  PATIENT SURVEYS:  LEFS 26/80  COGNITION: Overall cognitive status: Within functional limits for tasks assessed     SENSATION: WFL  EDEMA:  Unable to define due to body habitus  MUSCLE LENGTH: Hamstrings: slight tightness   PALPATION: TTP medial right joint line  knee  LUMBAR ROM:   Active  A/PROM  eval  Flexion FT to below patella  Extension 50% limited  Right lateral flexion wfl  Left lateral flexion wfl  Right rotation   Left rotation    (Blank rows = not tested)   LOWER EXTREMITY strength:  Strength (lbs) Right eval Left eval  Hip flexion 43.5 40.7  Hip extension    Hip abduction 28.1 34.3  Hip adduction    Hip internal rotation    Hip external rotation    Knee flexion 45.7 47.0  Knee extension    Ankle dorsiflexion    Ankle plantarflexion    Ankle inversion    Ankle eversion     (Blank rows = not tested)  LOWER EXTREMITY MMT:  ROM Right eval Left eval  Hip flexion    Hip extension    Hip abduction    Hip adduction    Hip internal rotation    Hip external rotation    Knee flexion 100 112  Knee extension    Ankle dorsiflexion    Ankle plantarflexion    Ankle inversion    Ankle eversion     (Blank rows = not tested) Difficult to assess due to body habitus   FUNCTIONAL TESTS:  5 times sit to stand: 14.42 pool bench Timed up and go (TUG): 13.87 4 stage: passed  08/13/23: 5x STS: 14.5s  GAIT: Distance walked: 500 ft Assistive device utilized: Single point cane Level of assistance: Complete Independence Comments: wide BOS (due to body habitus), knee and hip flex limited just clearing feet from floor. Increased lateral displacement                                                                                                                                TREATMENT  5/15  PPT 2x10 for pelvic position LTR 2x10 Bridge with PPT 3x10 Airex step up 10x only (2nd set painful)  Tandem 30s 3x LAQ 7.5lbs 3x10 STS to tall table 3x10 HS stretch 30s 3x Standing gastroc stretch 30s 3x  5/12  Nustep Lvl 5 5 min 5lb LAQ 3x10 5lb  STS 3x8 Jefferson curl 3x5 Standing sidebend 10x each Green band TKE 2x10 Red band paloff 2x10   [facility] Adult PT Treatment:                                                DATE:  08/13/23  Pt seen for aquatic therapy today.  Treatment took place in water 3.5-4.75 ft in depth at the [facility] pool. Temp of water was 91.  Pt entered/exited the pool via stairs independently with bilat rail.  - unsupported walking forward/ backward, multiple laps at 4+ ft - side stepping with arm addct/ abdct  yellow hand floats -> sidestep into wide squat - farmer carry with single yellow then bilat hand floats at side, marching forward/ backward. Cues for abd bracing - discussed engaging core for transitional movements - knee tap to same side/ opp side to rainbow hand floats with marching backward forward - STS at bench in water x 2 -> STS at bench with step under feet x 5 -> STS on 3rd step x 5  - decompression on noodle wrapped anteriorly across chest with UE on corner support: cycling; hip add/abd; flex/ext   [facility] Adult PT Treatment:                                                DATE: 08/10/23  Pt seen for aquatic therapy today.  Treatment took place in water 3.5-4.75 ft in depth at the [facility] pool. Temp of water was 91.  Pt entered/exited the pool via stairs independently with bilat rail.  - unsupported walking forward/ backward, multiple laps at 3.6 - side stepping with arm addct/ abdct  yellow hand floats -> side lunges - farmer carry with bilat yellow then single hand floats at side, marching forward/ backward. Cues for abd bracing - knee tap to same side/ opp side to rainbow hand floats with marching backward forward -L stretch - UE on yellow hand floats:  hip abdct/ addct x 10; hip flex/ ext x 10; heel raises; toe raises x10 - TrA set with long hollow noodle pull down to thighs x 10; repeated in staggered stance  - decompression on noodle wrapped posteriorly across chest with UE on corner support: cycling (across pool x 2 widths staying close to wall); hip add/abd; flex/ext   Pt requires the buoyancy and hydrostatic pressure of water for support, and  to offload joints by unweighting joint load by at least 50 % in navel deep water and by at least 75-80% in chest to neck deep water.  Viscosity of the water is needed for resistance of strengthening. Water current perturbations provides challenge to standing balance requiring increased core activation.   [facility] Adult PT Treatment:                                                DATE: 07/17/23 Pt seen for aquatic therapy today.  Treatment took place in water 3.5-4.75 ft in depth at the [facility] pool. Temp of water was 91.  Pt entered/exited the pool via stairs independently with bilat rail.  -  unsupported walking forward/ backward, multiple laps at 4 ft - side stepping with arm addct/ abdct -> with rainbow hand floats -> yellow hand floats  - farmer carry with bilat yellow and single blue hand floats at side, marching forward/ backward  - knee tap to same side/ opp side to rainbow hand floats with marching backward forward - UE on wall:  heel raises x 10; hip add/abd x 10 ( cues to limit height of leg) ; hip flexion /extension x 10 - UE on yellow hand floats:  hip abdct/ addct x 5; hip flex/ ext x 5; heel raises  - TrA set with long hollow noodle pull down to thighs x 10; repeated in staggered stance  - straddling noodle with UE on corner support: cycling   Pt requires the buoyancy and hydrostatic pressure of water for support, and to offload joints by unweighting joint load by at least 50 % in navel deep water and by at least 75-80% in chest to neck deep water.  Viscosity of the water is needed for resistance of strengthening. Water current perturbations provides challenge to standing balance requiring increased core activation.    PATIENT EDUCATION:  Education details:anatomy, exercise progression, DOMS expectations, muscle firing,  envelope of function, HEP, POC  Person educated: Patient Education method: Explanation Education comprehension: verbalized understanding  HOME  EXERCISE PROGRAM: Aquatic:Has assigned from previous episodes 1st episode: [id number]. 2nd episode: [id number]    ASSESSMENT:  CLINICAL IMPRESSION: Pt session limited at beginning due to LBP but pt able to improve stiffness and pain with warm up. Pt does have pain with low height step up, especially retro but pain appears to be soft tissue based on report of symptom location. Sharpness and superficial location appears to be distal HS attachment site.  HEP updated to include stretching. Plan to trial more machines in the gym for pt as tolerated at future visits. Continue with land based strength as tolerated for lumbopelvic region and knee.    Previous:  Received new order for LBP without sciatica due to LB strain. Pt reports a few weeks ago she was getting out of a low chair when she her Right LB "popped" causing the flare. It is better since then although continues to be pain sensitive and limits pt mobility. She continues to use cane with amb properly in L hand. She completes exercises submerged well. Focused on pain management through stretching as well as engaging LB and rle muscles with minimal load. Godd response exiting pool with reduction of pain 50%. Will plan just to contine with original POC as it did take into consideration her LBP. Will plan on progressing towards land based goals as set and will re set goals as approp and end of current cert.    OBJECTIVE IMPAIRMENTS: Abnormal gait, decreased endurance, decreased mobility, difficulty walking, decreased ROM, decreased strength, obesity, and pain.   ACTIVITY LIMITATIONS: lifting, standing, squatting, sleeping, stairs, and locomotion level  PARTICIPATION LIMITATIONS: shopping, community activity, and occupation  PERSONAL FACTORS: Behavior pattern, Fitness, Past/current experiences, Time since onset of injury/illness/exacerbation, and 1-2 comorbidities: see pmhx  are also affecting patient's functional outcome.   REHAB POTENTIAL:  Good  CLINICAL DECISION MAKING: Stable/uncomplicated  EVALUATION COMPLEXITY: Low   GOALS: Goals reviewed with patient? Yes  SHORT TERM GOALS: Target date: 4/25 Pt will tolerate full aquatic sessions consistently without increase in pain and with improving function to demonstrate good toleration and effectiveness of intervention.  Baseline: Goal status: Met 08/10/23  2.  Pt will  report complete reduction in right knee pain while submerged with reduction in pain for a few hours afterward. Baseline: 5/10 Goal status: Met 08/10/23  3.  Pt will report return to regular exercise regimen. Baseline: none Goal status: MET 08/10/23   LONG TERM GOALS: Target date: 09/03/23  Pt to improve on LEFS by at least 9 point to demonstrate statistically significant Improvement in function. Baseline:  Goal status: INITIAL  2.  Pt will be indep with final HEP's (land and aquatic as appropriate) for continued management of condition Baseline:  Goal status: INITIAL  3.  Pt will report decrease in pain by at least 50% for improved toleration to activity/quality of life and to demonstrate improved management of pain. Baseline:  Goal status: INITIAL  4.  Pt will improve on 5 X STS test to <or= 11s  to demonstrate improving functional lower extremity strength, transitional movements, and balance Baseline: see above  Goal status: IN PROGRESS   5.  Pt will improve on Tug test to <or=  11s to demonstrate improvement in lower extremity function, mobility and decreased fall risk. Baseline: 13.87 Goal status: INITIAL     PLAN:  PT FREQUENCY: 2x/week  PT DURATION: 8 weeks 12 visit (extended due to vacation)  PLANNED INTERVENTIONS: 78295- PT Re-evaluation, 97110-Therapeutic exercises, 97530- Therapeutic activity, 97112- Neuromuscular re-education, 97535- Self Care, 62130- Manual therapy, [phone]- Gait training, [phone]- Orthotic Fit/training, [phone]- Aquatic Therapy, [phone]- Ionotophoresis 4mg /ml Dexamethasone ,  Patient/Family education, Balance training, Stair training, Taping, Dry Needling, Joint mobilization, DME instructions, Cryotherapy, and Moist heat  PLAN FOR NEXT SESSION: aquatics: le strength; balance and proprioceptive retraining. Aquatic HEP Land: OA right knee strengthening /rom/stretching.  Lb stretching and strengthening  Silver Dross PT, DPT 08/20/23 10:16 AM      Referring diagnosis? Primary osteoarthritis of right knee  Treatment diagnosis? (if different than referring diagnosis) Primary osteoarthritis of right knee  What was this (referring dx) caused by? []  Surgery [x]  Fall [x]  Ongoing issue []  Arthritis []  Other: ____________  Laterality: [x]  Rt []  Lt []  Both  Check all possible CPT codes:  *CHOOSE 10 OR LESS*    See Planned Interventions listed in the Plan section of the Evaluation.

## 2023-08-21 NOTE — Telephone Encounter (Signed)
 Form placed up front for pick up.   Copy made for batch scanning.   Patient aware.

## 2023-08-24 ENCOUNTER — Ambulatory Visit (HOSPITAL_BASED_OUTPATIENT_CLINIC_OR_DEPARTMENT_OTHER): Admitting: Physical Therapy

## 2023-08-24 ENCOUNTER — Encounter (HOSPITAL_BASED_OUTPATIENT_CLINIC_OR_DEPARTMENT_OTHER): Payer: Self-pay | Admitting: Physical Therapy

## 2023-08-24 ENCOUNTER — Telehealth: Payer: Self-pay | Admitting: Internal Medicine

## 2023-08-24 DIAGNOSIS — M5459 Other low back pain: Secondary | ICD-10-CM

## 2023-08-24 DIAGNOSIS — I10 Essential (primary) hypertension: Secondary | ICD-10-CM

## 2023-08-24 DIAGNOSIS — R2689 Other abnormalities of gait and mobility: Secondary | ICD-10-CM | POA: Diagnosis not present

## 2023-08-24 DIAGNOSIS — M545 Low back pain, unspecified: Secondary | ICD-10-CM | POA: Diagnosis not present

## 2023-08-24 DIAGNOSIS — G8929 Other chronic pain: Secondary | ICD-10-CM | POA: Diagnosis not present

## 2023-08-24 DIAGNOSIS — M6281 Muscle weakness (generalized): Secondary | ICD-10-CM

## 2023-08-24 DIAGNOSIS — M25561 Pain in right knee: Secondary | ICD-10-CM | POA: Diagnosis not present

## 2023-08-24 MED ORDER — AMLODIPINE-OLMESARTAN 5-20 MG PO TABS: 1.0000 | ORAL_TABLET | Freq: Every day | ORAL | 1 refills | Status: DC

## 2023-08-24 NOTE — Therapy (Signed)
 OUTPATIENT PHYSICAL THERAPY LOWER EXTREMITY TREATMENT   Patient Name: [name] MRN: [id number] DOB:[birth year], 58 y.o., female Today's Date: 08/24/2023  END OF SESSION:  PT End of Session - 08/24/23 0924     Visit Number 8    Number of Visits 12    Date for PT Re-Evaluation 09/04/23    Authorization Type [employer] MCR    PT Start Time 0930    PT Stop Time 1010    PT Time Calculation (min) 40 min    Activity Tolerance Patient tolerated treatment well    Behavior During Therapy WFL for tasks assessed/performed               Past Medical History:  Diagnosis Date   Allergy    Anemia    Anxiety    Arthritis    Blood transfusion without reported diagnosis    Cough    Cough 06/11/2018   Difficulty sleeping 06/11/2018   Diverticulitis with perforation 2010   DVT (deep vein thrombosis) in pregnancy 11/24/2016   DVT, lower extremity (HCC)    Endometrial cancer (HCC)    Enlarged lymph nodes 08/13/2017   Family history of adverse reaction to anesthesia    pts daughter had difficulty awakening following anesthesia, long time to wake up   GERD (gastroesophageal reflux disease)    Headache    Hemoptysis 04/22/2018   History of bronchitis    History of ear infections    Hospital discharge follow-up 03/02/2019   Hx of migraines    Hyperlipidemia    Hypertension    IBS (irritable bowel syndrome)    Kidney infection    Lupus (systemic lupus erythematosus) (HCC) 02/2017   Morbid obesity (HCC)    Neck pain 01/13/2019   Ovarian cancer (HCC) dx'd 01/2015   PONV (postoperative nausea and vomiting)    Port-A-Cath in place    Right side   Portacath in place 09/12/2015   Pre-diabetes    Pyelonephritis    Right knee pain 11/28/2011   Scleroderma (HCC)    Screen for colon cancer 03/02/2019   Slow rate of speech 08/22/2015   Chronic from CVA.  She also has loss of left nasolabial fold and slight left facial droop/small asymmetry on the left side secondary to CVA   UTI (urinary  tract infection) 05/07/2018   Past Surgical History:  Procedure Laterality Date   ABDOMINAL HYSTERECTOMY N/A 03/13/2015   Procedure: TOTAL HYSTERECTOMY ABDOMINAL BILATERAL SALPINGO OOPHORECTOMY RADICAL TUMOR DEBUKING;  Surgeon: Alphonso Aschoff, MD;  Location: [facility];  Service: Gynecology;  Laterality: N/A;   BOWEL RESECTION  03/13/2015   Procedure: SMALL BOWEL RESECTION;  Surgeon: Alphonso Aschoff, MD;  Location: [facility];  Service: Gynecology;;   CESAREAN SECTION  1983 and 1984   COLONOSCOPY WITH PROPOFOL  N/A 07/19/2020   Procedure: COLONOSCOPY WITH PROPOFOL ;  Surgeon: Albertina Hugger, MD;  Location: [facility];  Service: Gastroenterology;  Laterality: N/A;   COLOSTOMY  04/07/2006   COLOSTOMY TAKEDOWN     ESOPHAGOGASTRODUODENOSCOPY (EGD) WITH PROPOFOL  N/A 12/21/2018   Procedure: ESOPHAGOGASTRODUODENOSCOPY (EGD) WITH PROPOFOL ;  Surgeon: Albertina Hugger, MD;  Location: [facility];  Service: Gastroenterology;  Laterality: N/A;   HEMOSTASIS CLIP PLACEMENT  07/19/2020   Procedure: HEMOSTASIS CLIP PLACEMENT;  Surgeon: Albertina Hugger, MD;  Location: [facility];  Service: Gastroenterology;;   IR REMOVAL TUN ACCESS W/ PORT W/O FL MOD SED  12/17/2022   LAPAROTOMY N/A 03/13/2015   Procedure: EXPLORATORY LAPAROTOMY;  Surgeon: Alphonso Aschoff,  MD;  Location: [facility];  Service: Gynecology;  Laterality: N/A;   POLYPECTOMY  07/19/2020   Procedure: POLYPECTOMY;  Surgeon: Albertina Hugger, MD;  Location: [facility];  Service: Gastroenterology;;   TONSILLECTOMY AND ADENOIDECTOMY  04/08/1995   VENTRAL HERNIA REPAIR  03/13/2015   Procedure: HERNIA REPAIR VENTRAL ADULT;  Surgeon: Alphonso Aschoff, MD;  Location: [facility];  Service: Gynecology;;   Patient Active Problem List   Diagnosis Date Noted   Upper respiratory tract infection 07/22/2023   Primary osteoarthritis of right knee 06/04/2023   Folic acid  deficiency 05/23/2023   Routine adult health maintenance 02/07/2023   Congestion of upper airway 07/22/2022    Recurrent pneumonia 07/08/2022   Acute respiratory failure with hypoxia (HCC) 06/25/2022   Aspiration into respiratory tract 06/25/2022   Injury of right foot 06/12/2022   Right knee pain 05/26/2022   Multifocal pneumonia 11/19/2021   Acute recurrent maxillary sinusitis 10/31/2021   Viral illness 06/05/2021   Chronic diastolic CHF (congestive heart failure) (HCC) 07/19/2020   Bronchospasm 07/19/2020   Incontinence 03/17/2020   Herpes simplex virus (HSV) infection of buttock 01/05/2020   OSA (obstructive sleep apnea) 07/25/2019   Prediabetes 07/25/2019   Lateral epicondylitis of left elbow 01/13/2019   Normocytic anemia 05/29/2018   Scleroderma (HCC) 04/22/2018   Bloody sputum 04/22/2018   Swelling of foot joint, right 04/08/2018   Positive ANA (antinuclear antibody) 09/23/2016   Eczema 01/27/2016   History of pulmonary embolism 09/12/2015   Chronic anticoagulation 09/06/2015   Chemotherapy-induced peripheral neuropathy (HCC) 06/13/2015   CKD (chronic kidney disease) stage 3, GFR 30-59 ml/min (HCC) 05/12/2015   GERD (gastroesophageal reflux disease) 05/12/2015   Ovarian carcinosarcoma, 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   Endometrial ca (HCC) 03/29/2015   Ventral hernia without obstruction or gangrene    Morbid obesity (HCC) 02/19/2015   Essential hypertension, benign    Stress at home 12/14/2014   Left knee pain 08/04/2013   Seasonal allergies 07/29/2013    PCP: Daleen Dubs MD  REFERRING PROVIDER: Rodgers Clack, DO   REFERRING DIAG: Primary osteoarthritis of right knee     08/06/23: M54.50 (ICD-10-CM) - Acute right-sided low back pain without sciatica   THERAPY DIAG:  Chronic pain of right knee  Muscle weakness (generalized)  Other abnormalities of gait and mobility  Other low back pain  Rationale for Evaluation and Treatment: Rehabilitation  ONSET DATE: exacerbated x 2 months  SUBJECTIVE:   SUBJECTIVE STATEMENT: Pt states that the back of the R knee has been  painful ever since last session. Pt notes that stepping backwards is more consistently painful. Pt notes a muscle relaxer did improve the pain.   Initial eval:  Cortizone injections x 1 month ago with little relief. Symptoms x >5 yrs. Neuropathies bilat feet due to chemo. Membership here at [facility]. Had 6 episodes of aspiration pneum last year, on wagovia for weight loss to potentially repair a hernia.  On bl thinner.  Hoping to have knee replacements when BMI 40.  PERTINENT HISTORY: Evaluate and treat for right knee bone on bone osteoarthritis. Please include aquatic therapy in treatment plan. PAIN:  Are you having pain? Yes: NPRS scale: current 7/10 Pain location:R knee Pain description: dull ache/pain Aggravating factors: walking Relieving factors: resting, non-WB  LBP 6/10 Right sided lumbar spine paraspinal area  PRECAUTIONS: None  RED FLAGS: None   WEIGHT BEARING RESTRICTIONS: No  FALLS:  Has patient fallen in last 6 months? Yes. Number of falls 1 tripped over husbands shoe  LIVING ENVIRONMENT: Lives with: lives with their family Lives in: House/apartment Stairs: 2 step ith handrail Has following equipment at home: Single point cane  OCCUPATION: disability  PLOF: Independent  PATIENT GOALS: decreased pain, lose weight  NEXT MD VISIT: end of month  OBJECTIVE:  Note: Objective measures were completed at Evaluation unless otherwise noted.  DIAGNOSTIC FINDINGS: RIGHT KNEE - COMPLETE 4+ VIEW   COMPARISON:  July 27, 2013   FINDINGS: Osteoarthrosis with narrowing of the medial compartment with marginal osteophytes. Femoropatellar osteoarthrosis. Mild narrowing of the lateral and medial compartments of the patella. No fractures   IMPRESSION: Osteoarthrosis.  PATIENT SURVEYS:  LEFS 26/80  COGNITION: Overall cognitive status: Within functional limits for tasks assessed     SENSATION: WFL  EDEMA:  Unable to define due to body habitus  MUSCLE  LENGTH: Hamstrings: slight tightness   PALPATION: TTP medial right joint line knee  LUMBAR ROM:   Active  A/PROM  eval  Flexion FT to below patella  Extension 50% limited  Right lateral flexion wfl  Left lateral flexion wfl  Right rotation   Left rotation    (Blank rows = not tested)   LOWER EXTREMITY strength:  Strength (lbs) Right eval Left eval  Hip flexion 43.5 40.7  Hip extension    Hip abduction 28.1 34.3  Hip adduction    Hip internal rotation    Hip external rotation    Knee flexion 45.7 47.0  Knee extension    Ankle dorsiflexion    Ankle plantarflexion    Ankle inversion    Ankle eversion     (Blank rows = not tested)  LOWER EXTREMITY MMT:  ROM Right eval Left eval  Hip flexion    Hip extension    Hip abduction    Hip adduction    Hip internal rotation    Hip external rotation    Knee flexion 100 112  Knee extension    Ankle dorsiflexion    Ankle plantarflexion    Ankle inversion    Ankle eversion     (Blank rows = not tested) Difficult to assess due to body habitus   FUNCTIONAL TESTS:  5 times sit to stand: 14.42 pool bench Timed up and go (TUG): 13.87 4 stage: passed  08/13/23: 5x STS: 14.5s  GAIT: Distance walked: 500 ft Assistive device utilized: Single point cane Level of assistance: Complete Independence Comments: wide BOS (due to body habitus), knee and hip flex limited just clearing feet from floor. Increased lateral displacement                                                                                                                            TREATMENT   5/19 Nustep lvl 4 6 min  STM to R lateral and medial gastroc; L lateral HS tendon cross friciton  Gait with SPC: use in opp UE; sequencing and gait mechanics seated gastroc stretch 30s 3x  5/15  PPT 2x10 for pelvic  position LTR 2x10 Bridge with PPT 3x10 Airex step up 10x only (2nd set painful)  Tandem 30s 3x LAQ 7.5lbs 3x10 STS to tall table 3x10 HS  stretch 30s 3x Standing gastroc stretch 30s 3x  5/12  Nustep Lvl 5 5 min 5lb LAQ 3x10 5lb STS 3x8 Jefferson curl 3x5 Standing sidebend 10x each Green band TKE 2x10 Red band paloff 2x10   [facility] Adult PT Treatment:                                                DATE: 08/13/23  Pt seen for aquatic therapy today.  Treatment took place in water 3.5-4.75 ft in depth at the [facility] pool. Temp of water was 91.  Pt entered/exited the pool via stairs independently with bilat rail.  - unsupported walking forward/ backward, multiple laps at 4+ ft - side stepping with arm addct/ abdct  yellow hand floats -> sidestep into wide squat - farmer carry with single yellow then bilat hand floats at side, marching forward/ backward. Cues for abd bracing - discussed engaging core for transitional movements - knee tap to same side/ opp side to rainbow hand floats with marching backward forward - STS at bench in water x 2 -> STS at bench with step under feet x 5 -> STS on 3rd step x 5  - decompression on noodle wrapped anteriorly across chest with UE on corner support: cycling; hip add/abd; flex/ext   [facility] Adult PT Treatment:                                                DATE: 08/10/23  Pt seen for aquatic therapy today.  Treatment took place in water 3.5-4.75 ft in depth at the [facility] pool. Temp of water was 91.  Pt entered/exited the pool via stairs independently with bilat rail.  - unsupported walking forward/ backward, multiple laps at 3.6 - side stepping with arm addct/ abdct  yellow hand floats -> side lunges - farmer carry with bilat yellow then single hand floats at side, marching forward/ backward. Cues for abd bracing - knee tap to same side/ opp side to rainbow hand floats with marching backward forward -L stretch - UE on yellow hand floats:  hip abdct/ addct x 10; hip flex/ ext x 10; heel raises; toe raises x10 - TrA set with long hollow noodle pull down to thighs x  10; repeated in staggered stance  - decompression on noodle wrapped posteriorly across chest with UE on corner support: cycling (across pool x 2 widths staying close to wall); hip add/abd; flex/ext   Pt requires the buoyancy and hydrostatic pressure of water for support, and to offload joints by unweighting joint load by at least 50 % in navel deep water and by at least 75-80% in chest to neck deep water.  Viscosity of the water is needed for resistance of strengthening. Water current perturbations provides challenge to standing balance requiring increased core activation.   [facility] Adult PT Treatment:  DATE: 07/17/23 Pt seen for aquatic therapy today.  Treatment took place in water 3.5-4.75 ft in depth at the [facility] pool. Temp of water was 91.  Pt entered/exited the pool via stairs independently with bilat rail.  - unsupported walking forward/ backward, multiple laps at 4 ft - side stepping with arm addct/ abdct -> with rainbow hand floats -> yellow hand floats  - farmer carry with bilat yellow and single blue hand floats at side, marching forward/ backward  - knee tap to same side/ opp side to rainbow hand floats with marching backward forward - UE on wall:  heel raises x 10; hip add/abd x 10 ( cues to limit height of leg) ; hip flexion /extension x 10 - UE on yellow hand floats:  hip abdct/ addct x 5; hip flex/ ext x 5; heel raises  - TrA set with long hollow noodle pull down to thighs x 10; repeated in staggered stance  - straddling noodle with UE on corner support: cycling   Pt requires the buoyancy and hydrostatic pressure of water for support, and to offload joints by unweighting joint load by at least 50 % in navel deep water and by at least 75-80% in chest to neck deep water.  Viscosity of the water is needed for resistance of strengthening. Water current perturbations provides challenge to standing balance requiring increased core  activation.    PATIENT EDUCATION:  Education details:anatomy, exercise progression, DOMS expectations, muscle firing,  envelope of function, HEP, POC  Person educated: Patient Education method: Explanation Education comprehension: verbalized understanding  HOME EXERCISE PROGRAM: Aquatic:Has assigned from previous episodes 1st episode: [id number]. 2nd episode: [id number]    ASSESSMENT:  CLINICAL IMPRESSION: Pt presents with increase in R posterior knee pain that is still consistent with soft tissue. STM signficantly relieved pain by end of session and with new edu about AD usage, pt reports much less discomfort into the R knee at exit. HEP updated with stretching. Plan start standing calf and machine exercise at next as tolerated. Session used today for pain management as pt is planning to utilize pool for exercise following PT session and was worried about her continued pain.   Previous:  Received new order for LBP without sciatica due to LB strain. Pt reports a few weeks ago she was getting out of a low chair when she her Right LB "popped" causing the flare. It is better since then although continues to be pain sensitive and limits pt mobility. She continues to use cane with amb properly in L hand. She completes exercises submerged well. Focused on pain management through stretching as well as engaging LB and rle muscles with minimal load. Godd response exiting pool with reduction of pain 50%. Will plan just to contine with original POC as it did take into consideration her LBP. Will plan on progressing towards land based goals as set and will re set goals as approp and end of current cert.    OBJECTIVE IMPAIRMENTS: Abnormal gait, decreased endurance, decreased mobility, difficulty walking, decreased ROM, decreased strength, obesity, and pain.   ACTIVITY LIMITATIONS: lifting, standing, squatting, sleeping, stairs, and locomotion level  PARTICIPATION LIMITATIONS: shopping, community  activity, and occupation  PERSONAL FACTORS: Behavior pattern, Fitness, Past/current experiences, Time since onset of injury/illness/exacerbation, and 1-2 comorbidities: see pmhx  are also affecting patient's functional outcome.   REHAB POTENTIAL: Good  CLINICAL DECISION MAKING: Stable/uncomplicated  EVALUATION COMPLEXITY: Low   GOALS: Goals reviewed with patient? Yes  SHORT TERM GOALS: Target date:  4/25 Pt will tolerate full aquatic sessions consistently without increase in pain and with improving function to demonstrate good toleration and effectiveness of intervention.  Baseline: Goal status: Met 08/10/23  2.  Pt will report complete reduction in right knee pain while submerged with reduction in pain for a few hours afterward. Baseline: 5/10 Goal status: Met 08/10/23  3.  Pt will report return to regular exercise regimen. Baseline: none Goal status: MET 08/10/23   LONG TERM GOALS: Target date: 09/03/23  Pt to improve on LEFS by at least 9 point to demonstrate statistically significant Improvement in function. Baseline:  Goal status: INITIAL  2.  Pt will be indep with final HEP's (land and aquatic as appropriate) for continued management of condition Baseline:  Goal status: INITIAL  3.  Pt will report decrease in pain by at least 50% for improved toleration to activity/quality of life and to demonstrate improved management of pain. Baseline:  Goal status: INITIAL  4.  Pt will improve on 5 X STS test to <or= 11s  to demonstrate improving functional lower extremity strength, transitional movements, and balance Baseline: see above  Goal status: IN PROGRESS   5.  Pt will improve on Tug test to <or=  11s to demonstrate improvement in lower extremity function, mobility and decreased fall risk. Baseline: 13.87 Goal status: INITIAL     PLAN:  PT FREQUENCY: 2x/week  PT DURATION: 8 weeks 12 visit (extended due to vacation)  PLANNED INTERVENTIONS: 16109- PT Re-evaluation,  97110-Therapeutic exercises, 97530- Therapeutic activity, 97112- Neuromuscular re-education, 97535- Self Care, 60454- Manual therapy, [phone]- Gait training, [phone]- Orthotic Fit/training, [phone]- Aquatic Therapy, [phone]- Ionotophoresis 4mg /ml Dexamethasone , Patient/Family education, Balance training, Stair training, Taping, Dry Needling, Joint mobilization, DME instructions, Cryotherapy, and Moist heat  PLAN FOR NEXT SESSION: aquatics: le strength; balance and proprioceptive retraining. Aquatic HEP Land: OA right knee strengthening /rom/stretching.  Lb stretching and strengthening  Silver Dross PT, DPT 08/24/23 10:13 AM      Referring diagnosis? Primary osteoarthritis of right knee  Treatment diagnosis? (if different than referring diagnosis) Primary osteoarthritis of right knee  What was this (referring dx) caused by? []  Surgery [x]  Fall [x]  Ongoing issue []  Arthritis []  Other: ____________  Laterality: [x]  Rt []  Lt []  Both  Check all possible CPT codes:  *CHOOSE 10 OR LESS*    See Planned Interventions listed in the Plan section of the Evaluation.

## 2023-08-24 NOTE — Telephone Encounter (Signed)
 Pt's medication was sent to pt's pharmacy as requested. Confirmation received.

## 2023-08-24 NOTE — Telephone Encounter (Signed)
*  STAT* If patient is at the pharmacy, call can be transferred to refill team.   1. Which medications need to be refilled? (please list name of each medication and dose if known)   amLODipine -olmesartan  (AZOR ) 5-20 MG tablet   2. Which pharmacy/location (including street and city if local pharmacy) is medication to be sent to? [id number] Jonette Nestle, [place] - [address] AT [facility] OF [address] Phone: [phone]  Fax: [phone]     3. Do they need a 30 day or 90 day supply? 90

## 2023-08-25 DIAGNOSIS — H52222 Regular astigmatism, left eye: Secondary | ICD-10-CM | POA: Diagnosis not present

## 2023-08-25 DIAGNOSIS — H2513 Age-related nuclear cataract, bilateral: Secondary | ICD-10-CM | POA: Diagnosis not present

## 2023-08-25 DIAGNOSIS — H5202 Hypermetropia, left eye: Secondary | ICD-10-CM | POA: Diagnosis not present

## 2023-08-25 DIAGNOSIS — H524 Presbyopia: Secondary | ICD-10-CM | POA: Diagnosis not present

## 2023-08-27 ENCOUNTER — Encounter (HOSPITAL_BASED_OUTPATIENT_CLINIC_OR_DEPARTMENT_OTHER): Payer: Self-pay

## 2023-08-27 ENCOUNTER — Ambulatory Visit (HOSPITAL_BASED_OUTPATIENT_CLINIC_OR_DEPARTMENT_OTHER)

## 2023-08-27 DIAGNOSIS — G8929 Other chronic pain: Secondary | ICD-10-CM

## 2023-08-27 DIAGNOSIS — M5459 Other low back pain: Secondary | ICD-10-CM

## 2023-08-27 DIAGNOSIS — R2689 Other abnormalities of gait and mobility: Secondary | ICD-10-CM | POA: Diagnosis not present

## 2023-08-27 DIAGNOSIS — M6281 Muscle weakness (generalized): Secondary | ICD-10-CM

## 2023-08-27 DIAGNOSIS — M25561 Pain in right knee: Secondary | ICD-10-CM | POA: Diagnosis not present

## 2023-08-27 DIAGNOSIS — M545 Low back pain, unspecified: Secondary | ICD-10-CM | POA: Diagnosis not present

## 2023-08-27 NOTE — Therapy (Signed)
 OUTPATIENT PHYSICAL THERAPY LOWER EXTREMITY TREATMENT   Patient Name: [name] MRN: [id number] DOB:07-11-1965, 58 y.o., female Today's Date: 08/27/2023  END OF SESSION:  PT End of Session - 08/27/23 0938     Visit Number 9    Number of Visits 12    Date for PT Re-Evaluation 09/04/23    Authorization Type [employer] MCR    PT Start Time 9528    PT Stop Time 1025    PT Time Calculation (min) 48 min    Activity Tolerance Patient tolerated treatment well    Behavior During Therapy [facility] for tasks assessed/performed                Past Medical History:  Diagnosis Date   Allergy    Anemia    Anxiety    Arthritis    Blood transfusion without reported diagnosis    Cough    Cough 06/11/2018   Difficulty sleeping 06/11/2018   Diverticulitis with perforation 2010   DVT (deep vein thrombosis) in pregnancy 11/24/2016   DVT, lower extremity (HCC)    Endometrial cancer (HCC)    Enlarged lymph nodes 08/13/2017   Family history of adverse reaction to anesthesia    pts daughter had difficulty awakening following anesthesia, long time to wake up   GERD (gastroesophageal reflux disease)    Headache    Hemoptysis 04/22/2018   History of bronchitis    History of ear infections    Hospital discharge follow-up 03/02/2019   Hx of migraines    Hyperlipidemia    Hypertension    IBS (irritable bowel syndrome)    Kidney infection    Lupus (systemic lupus erythematosus) (HCC) 02/2017   Morbid obesity (HCC)    Neck pain 01/13/2019   Ovarian cancer (HCC) dx'd 01/2015   PONV (postoperative nausea and vomiting)    Port-A-Cath in place    Right side   Portacath in place 09/12/2015   Pre-diabetes    Pyelonephritis    Right knee pain 11/28/2011   Scleroderma (HCC)    Screen for colon cancer 03/02/2019   Slow rate of speech 08/22/2015   Chronic from CVA.  She also has loss of left nasolabial fold and slight left facial droop/small asymmetry on the left side secondary to CVA   UTI (urinary  tract infection) 05/07/2018   Past Surgical History:  Procedure Laterality Date   ABDOMINAL HYSTERECTOMY N/A 03/13/2015   Procedure: TOTAL HYSTERECTOMY ABDOMINAL BILATERAL SALPINGO OOPHORECTOMY RADICAL TUMOR DEBUKING;  Surgeon: Alphonso Aschoff, MD;  Location: [facility];  Service: Gynecology;  Laterality: N/A;   BOWEL RESECTION  03/13/2015   Procedure: SMALL BOWEL RESECTION;  Surgeon: Alphonso Aschoff, MD;  Location: [facility];  Service: Gynecology;;   CESAREAN SECTION  1983 and 1984   COLONOSCOPY WITH PROPOFOL  N/A 07/19/2020   Procedure: COLONOSCOPY WITH PROPOFOL ;  Surgeon: Albertina Hugger, MD;  Location: [facility];  Service: Gastroenterology;  Laterality: N/A;   COLOSTOMY  04/07/2006   COLOSTOMY TAKEDOWN     ESOPHAGOGASTRODUODENOSCOPY (EGD) WITH PROPOFOL  N/A 12/21/2018   Procedure: ESOPHAGOGASTRODUODENOSCOPY (EGD) WITH PROPOFOL ;  Surgeon: Albertina Hugger, MD;  [facility];  Service: Gastroenterology;  Laterality: N/A;   HEMOSTASIS CLIP PLACEMENT  07/19/2020   Procedure: HEMOSTASIS CLIP PLACEMENT;  Surgeon: Albertina Hugger, MD;  [facility]: [facility];  Service: Gastroenterology;;   IR REMOVAL TUN ACCESS W/ PORT W/O FL MOD SED  12/17/2022   LAPAROTOMY N/A 03/13/2015   Procedure: EXPLORATORY LAPAROTOMY;  Surgeon: Odie Benne  Pearly Bound, MD;  Location: [facility];  Service: Gynecology;  Laterality: N/A;   POLYPECTOMY  07/19/2020   Procedure: POLYPECTOMY;  Surgeon: Albertina Hugger, MD;  [facility]: [facility];  Service: Gastroenterology;;   TONSILLECTOMY AND ADENOIDECTOMY  04/08/1995   VENTRAL HERNIA REPAIR  03/13/2015   Procedure: HERNIA REPAIR VENTRAL ADULT;  Surgeon: Alphonso Aschoff, MD;  Location: [facility];  Service: Gynecology;;   Patient Active Problem List   Diagnosis Date Noted   Upper respiratory tract infection 07/22/2023   Primary osteoarthritis of right knee 06/04/2023   Folic acid  deficiency 05/23/2023   Routine adult health maintenance 02/07/2023   Congestion of upper airway 07/22/2022    Recurrent pneumonia 07/08/2022   Acute respiratory failure with hypoxia (HCC) 06/25/2022   Aspiration into respiratory tract 06/25/2022   Injury of right foot 06/12/2022   Right knee pain 05/26/2022   Multifocal pneumonia 11/19/2021   Acute recurrent maxillary sinusitis 10/31/2021   Viral illness 06/05/2021   Chronic diastolic CHF (congestive heart failure) (HCC) 07/19/2020   Bronchospasm 07/19/2020   Incontinence 03/17/2020   Herpes simplex virus (HSV) infection of buttock 01/05/2020   OSA (obstructive sleep apnea) 07/25/2019   Prediabetes 07/25/2019   Lateral epicondylitis of left elbow 01/13/2019   Normocytic anemia 05/29/2018   Scleroderma (HCC) 04/22/2018   Bloody sputum 04/22/2018   Swelling of foot joint, right 04/08/2018   Positive ANA (antinuclear antibody) 09/23/2016   Eczema 01/27/2016   History of pulmonary embolism 09/12/2015   Chronic anticoagulation 09/06/2015   Chemotherapy-induced peripheral neuropathy (HCC) 06/13/2015   CKD (chronic kidney disease) stage 3, GFR 30-59 ml/min (HCC) 05/12/2015   GERD (gastroesophageal reflux disease) 05/12/2015   Ovarian carcinosarcoma, 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   Endometrial ca (HCC) 03/29/2015   Ventral hernia without obstruction or gangrene    Morbid obesity (HCC) 02/19/2015   Essential hypertension, benign    Stress at home 12/14/2014   Left knee pain 08/04/2013   Seasonal allergies 07/29/2013    PCP: Daleen Dubs MD  REFERRING PROVIDER: Rodgers Clack, DO   REFERRING DIAG: Primary osteoarthritis of right knee     08/06/23: M54.50 (ICD-10-CM) - Acute right-sided low back pain without sciatica   THERAPY DIAG:  Muscle weakness (generalized)  Other low back pain  Other abnormalities of gait and mobility  Chronic pain of right knee  Rationale for Evaluation and Treatment: Rehabilitation  ONSET DATE: exacerbated x 2 months  SUBJECTIVE:   SUBJECTIVE STATEMENT: Pt she has continued to have posterior R knee pain.  She states it stated after airex step ups 2 land visits ago. Reports pain is sharp in nature and depends on what she's doing. Continues to have buckling in R knee, which has been happening daily since last week. No falls reported.  Initial eval:  Cortizone injections x 1 month ago with little relief. Symptoms x >5 yrs. Neuropathies bilat feet due to chemo. Membership here at [facility]. Had 6 episodes of aspiration pneum last year, on wagovia for weight loss to potentially repair a hernia.  On bl thinner.  Hoping to have knee replacements when BMI 40.  PERTINENT HISTORY: Evaluate and treat for right knee bone on bone osteoarthritis. Please include aquatic therapy in treatment plan. PAIN:  Are you having pain? Yes: NPRS scale: current 7/10 Pain location:R knee Pain description: dull ache/pain Aggravating factors: walking Relieving factors: resting, non-WB  LBP 6/10 Right sided lumbar spine paraspinal area  PRECAUTIONS: None  RED FLAGS: None   WEIGHT BEARING RESTRICTIONS: No  FALLS:  Has patient fallen in last 6 months? Yes. Number of falls 1 tripped over husbands shoe  LIVING ENVIRONMENT: Lives with: lives with their family Lives in: House/apartment Stairs: 2 step ith handrail Has following equipment at home: Single point cane  OCCUPATION: disability  PLOF: Independent  PATIENT GOALS: decreased pain, lose weight  NEXT MD VISIT: end of month  OBJECTIVE:  Note: Objective measures were completed at Evaluation unless otherwise noted.  DIAGNOSTIC FINDINGS: RIGHT KNEE - COMPLETE 4+ VIEW   COMPARISON:  July 27, 2013   FINDINGS: Osteoarthrosis with narrowing of the medial compartment with marginal osteophytes. Femoropatellar osteoarthrosis. Mild narrowing of the lateral and medial compartments of the patella. No fractures   IMPRESSION: Osteoarthrosis.  PATIENT SURVEYS:  LEFS 26/80  COGNITION: Overall cognitive status: Within functional limits for tasks  assessed     SENSATION: WFL  EDEMA:  Unable to define due to body habitus  MUSCLE LENGTH: Hamstrings: slight tightness   PALPATION: TTP medial right joint line knee  LUMBAR ROM:   Active  A/PROM  eval  Flexion FT to below patella  Extension 50% limited  Right lateral flexion wfl  Left lateral flexion wfl  Right rotation   Left rotation    (Blank rows = not tested)   LOWER EXTREMITY strength:  Strength (lbs) Right eval Left eval  Hip flexion 43.5 40.7  Hip extension    Hip abduction 28.1 34.3  Hip adduction    Hip internal rotation    Hip external rotation    Knee flexion 45.7 47.0  Knee extension    Ankle dorsiflexion    Ankle plantarflexion    Ankle inversion    Ankle eversion     (Blank rows = not tested)  LOWER EXTREMITY MMT:  ROM Right eval Left eval  Hip flexion    Hip extension    Hip abduction    Hip adduction    Hip internal rotation    Hip external rotation    Knee flexion 100 112  Knee extension    Ankle dorsiflexion    Ankle plantarflexion    Ankle inversion    Ankle eversion     (Blank rows = not tested) Difficult to assess due to body habitus   FUNCTIONAL TESTS:  5 times sit to stand: 14.42 pool bench Timed up and go (TUG): 13.87 4 stage: passed  08/13/23: 5x STS: 14.5s  GAIT: Distance walked: 500 ft Assistive device utilized: Single point cane Level of assistance: Complete Independence Comments: wide BOS (due to body habitus), knee and hip flex limited just clearing feet from floor. Increased lateral displacement                                                                                                                            TREATMENT    5/22 Nustep lvl 4 5 min  Standing gastroc stretch 3x20seconds STM/IASTM using tennis ball to lateral R prox peroneals/gastroc, distal HS/ITB in L  sidelying-pillow b/w knees -s/l clam 2x10R Supine march with TrA- 2x10 Hooklying ball squeeze with TrA and glute squeeze 5"  2x10  HEP update/review     5/19 Nustep lvl 4 6 min  STM to R lateral and medial gastroc; L lateral HS tendon cross friciton  Gait with SPC: use in opp UE; sequencing and gait mechanics seated gastroc stretch 30s 3x  5/15  PPT 2x10 for pelvic position LTR 2x10 Bridge with PPT 3x10 Airex step up 10x only (2nd set painful)  Tandem 30s 3x LAQ 7.5lbs 3x10 STS to tall table 3x10 HS stretch 30s 3x Standing gastroc stretch 30s 3x  5/12  Nustep Lvl 5 5 min 5lb LAQ 3x10 5lb STS 3x8 Jefferson curl 3x5 Standing sidebend 10x each Green band TKE 2x10 Red band paloff 2x10   [facility] Adult PT Treatment:                                                DATE: 08/13/23  Pt seen for aquatic therapy today.  Treatment took place in water 3.5-4.75 ft in depth at the [facility] pool. Temp of water was 91.  Pt entered/exited the pool via stairs independently with bilat rail.  - unsupported walking forward/ backward, multiple laps at 4+ ft - side stepping with arm addct/ abdct  yellow hand floats -> sidestep into wide squat - farmer carry with single yellow then bilat hand floats at side, marching forward/ backward. Cues for abd bracing - discussed engaging core for transitional movements - knee tap to same side/ opp side to rainbow hand floats with marching backward forward - STS at bench in water x 2 -> STS at bench with step under feet x 5 -> STS on 3rd step x 5  - decompression on noodle wrapped anteriorly across chest with UE on corner support: cycling; hip add/abd; flex/ext   [facility] Adult PT Treatment:                                                DATE: 08/10/23  Pt seen for aquatic therapy today.  Treatment took place in water 3.5-4.75 ft in depth at the [facility] pool. Temp of water was 91.  Pt entered/exited the pool via stairs independently with bilat rail.  - unsupported walking forward/ backward, multiple laps at 3.6 - side stepping with arm addct/ abdct  yellow  hand floats -> side lunges - farmer carry with bilat yellow then single hand floats at side, marching forward/ backward. Cues for abd bracing - knee tap to same side/ opp side to rainbow hand floats with marching backward forward -L stretch - UE on yellow hand floats:  hip abdct/ addct x 10; hip flex/ ext x 10; heel raises; toe raises x10 - TrA set with long hollow noodle pull down to thighs x 10; repeated in staggered stance  - decompression on noodle wrapped posteriorly across chest with UE on corner support: cycling (across pool x 2 widths staying close to wall); hip add/abd; flex/ext   Pt requires the buoyancy and hydrostatic pressure of water for support, and to offload joints by unweighting joint load by at least 50 % in navel deep water and by at least 75-80% in  chest to neck deep water.  Viscosity of the water is needed for resistance of strengthening. Water current perturbations provides challenge to standing balance requiring increased core activation.   [facility] Adult PT Treatment:                                                DATE: 07/17/23 Pt seen for aquatic therapy today.  Treatment took place in water 3.5-4.75 ft in depth at the [facility] pool. Temp of water was 91.  Pt entered/exited the pool via stairs independently with bilat rail.  - unsupported walking forward/ backward, multiple laps at 4 ft - side stepping with arm addct/ abdct -> with rainbow hand floats -> yellow hand floats  - farmer carry with bilat yellow and single blue hand floats at side, marching forward/ backward  - knee tap to same side/ opp side to rainbow hand floats with marching backward forward - UE on wall:  heel raises x 10; hip add/abd x 10 ( cues to limit height of leg) ; hip flexion /extension x 10 - UE on yellow hand floats:  hip abdct/ addct x 5; hip flex/ ext x 5; heel raises  - TrA set with long hollow noodle pull down to thighs x 10; repeated in staggered stance  - straddling noodle with  UE on corner support: cycling   Pt requires the buoyancy and hydrostatic pressure of water for support, and to offload joints by unweighting joint load by at least 50 % in navel deep water and by at least 75-80% in chest to neck deep water.  Viscosity of the water is needed for resistance of strengthening. Water current perturbations provides challenge to standing balance requiring increased core activation.    PATIENT EDUCATION:  Education details:anatomy, exercise progression, DOMS expectations, muscle firing,  envelope of function, HEP, POC  Person educated: Patient Education method: Explanation Education comprehension: verbalized understanding  HOME EXERCISE PROGRAM: Aquatic:Has assigned from previous episodes 1st episode: [id number]. 2nd episode: [id number]    ASSESSMENT:  CLINICAL IMPRESSION: Pt very tender to lateral gastroc/peroneal area and distal ITB/HS. Pt does report hx of global tenderness likely related to chemotherapy. Spent time on gentle STM and IASTM using tennis ball to this area. Worked on supine stabilization/strengthening exercises without c/o pain, only fatigue. Cues required for core engagement/stability. Provided updated HEP. She did report improvement in pain level at end of session. Pt has 2 more visits scheduled.    Previous:  Received new order for LBP without sciatica due to LB strain. Pt reports a few weeks ago she was getting out of a low chair when she her Right LB "popped" causing the flare. It is better since then although continues to be pain sensitive and limits pt mobility. She continues to use cane with amb properly in L hand. She completes exercises submerged well. Focused on pain management through stretching as well as engaging LB and rle muscles with minimal load. Godd response exiting pool with reduction of pain 50%. Will plan just to contine with original POC as it did take into consideration her LBP. Will plan on progressing towards land based goals  as set and will re set goals as approp and end of current cert.    OBJECTIVE IMPAIRMENTS: Abnormal gait, decreased endurance, decreased mobility, difficulty walking, decreased ROM, decreased strength, obesity, and pain.   ACTIVITY LIMITATIONS:  lifting, standing, squatting, sleeping, stairs, and locomotion level  PARTICIPATION LIMITATIONS: shopping, community activity, and occupation  PERSONAL FACTORS: Behavior pattern, Fitness, Past/current experiences, Time since onset of injury/illness/exacerbation, and 1-2 comorbidities: see pmhx  are also affecting patient's functional outcome.   REHAB POTENTIAL: Good  CLINICAL DECISION MAKING: Stable/uncomplicated  EVALUATION COMPLEXITY: Low   GOALS: Goals reviewed with patient? Yes  SHORT TERM GOALS: Target date: 4/25 Pt will tolerate full aquatic sessions consistently without increase in pain and with improving function to demonstrate good toleration and effectiveness of intervention.  Baseline: Goal status: Met 08/10/23  2.  Pt will report complete reduction in right knee pain while submerged with reduction in pain for a few hours afterward. Baseline: 5/10 Goal status: Met 08/10/23  3.  Pt will report return to regular exercise regimen. Baseline: none Goal status: MET 08/10/23   LONG TERM GOALS: Target date: 09/03/23  Pt to improve on LEFS by at least 9 point to demonstrate statistically significant Improvement in function. Baseline:  Goal status: INITIAL  2.  Pt will be indep with final HEP's (land and aquatic as appropriate) for continued management of condition Baseline:  Goal status: INITIAL  3.  Pt will report decrease in pain by at least 50% for improved toleration to activity/quality of life and to demonstrate improved management of pain. Baseline:  Goal status: INITIAL  4.  Pt will improve on 5 X STS test to <or= 11s  to demonstrate improving functional lower extremity strength, transitional movements, and balance Baseline:  see above  Goal status: IN PROGRESS   5.  Pt will improve on Tug test to <or=  11s to demonstrate improvement in lower extremity function, mobility and decreased fall risk. Baseline: 13.87 Goal status: INITIAL     PLAN:  PT FREQUENCY: 2x/week  PT DURATION: 8 weeks 12 visit (extended due to vacation)  PLANNED INTERVENTIONS: 78295- PT Re-evaluation, 97110-Therapeutic exercises, 97530- Therapeutic activity, 97112- Neuromuscular re-education, 97535- Self Care, 62130- Manual therapy, [phone]- Gait training, [phone]- Orthotic Fit/training, [phone]- Aquatic Therapy, [phone]- Ionotophoresis 4mg /ml Dexamethasone , Patient/Family education, Balance training, Stair training, Taping, Dry Needling, Joint mobilization, DME instructions, Cryotherapy, and Moist heat  PLAN FOR NEXT SESSION: aquatics: le strength; balance and proprioceptive retraining. Aquatic HEP Land: OA right knee strengthening /rom/stretching.  Lb stretching and strengthening  Silver Dross PT, DPT 08/27/23 1:36 PM      Referring diagnosis? Primary osteoarthritis of right knee  Treatment diagnosis? (if different than referring diagnosis) Primary osteoarthritis of right knee  What was this (referring dx) caused by? []  Surgery [x]  Fall [x]  Ongoing issue []  Arthritis []  Other: ____________  Laterality: [x]  Rt []  Lt []  Both  Check all possible CPT codes:  *CHOOSE 10 OR LESS*    See Planned Interventions listed in the Plan section of the Evaluation.

## 2023-08-28 DIAGNOSIS — N183 Chronic kidney disease, stage 3 unspecified: Secondary | ICD-10-CM | POA: Diagnosis not present

## 2023-08-28 DIAGNOSIS — G4733 Obstructive sleep apnea (adult) (pediatric): Secondary | ICD-10-CM | POA: Diagnosis not present

## 2023-08-28 DIAGNOSIS — Z8701 Personal history of pneumonia (recurrent): Secondary | ICD-10-CM | POA: Diagnosis not present

## 2023-08-28 DIAGNOSIS — I10 Essential (primary) hypertension: Secondary | ICD-10-CM | POA: Diagnosis not present

## 2023-08-28 DIAGNOSIS — Z6841 Body Mass Index (BMI) 40.0 and over, adult: Secondary | ICD-10-CM | POA: Diagnosis not present

## 2023-08-28 DIAGNOSIS — I5032 Chronic diastolic (congestive) heart failure: Secondary | ICD-10-CM | POA: Diagnosis not present

## 2023-08-28 DIAGNOSIS — E66813 Obesity, class 3: Secondary | ICD-10-CM | POA: Diagnosis not present

## 2023-08-28 DIAGNOSIS — K219 Gastro-esophageal reflux disease without esophagitis: Secondary | ICD-10-CM | POA: Diagnosis not present

## 2023-09-01 ENCOUNTER — Ambulatory Visit (HOSPITAL_BASED_OUTPATIENT_CLINIC_OR_DEPARTMENT_OTHER): Admitting: Physical Therapy

## 2023-09-01 NOTE — Progress Notes (Unsigned)
  SUBJECTIVE:   CHIEF COMPLAINT / HPI:   [name] is a 58 year old female with recurrent pneumonia who presents with concerns of another pneumonia episode.  She has experienced pneumonia seven times previously. Symptoms began on Friday with hemoptysis, chills, and upper respiratory infection signs. Although symptoms have improved, she remains concerned due to past hospitalizations, including intensive care. She uses oxygen at night, albuterol , and Stiolto inhalers twice daily. Her temperature has not exceeded 99 degrees, and she last had hemoptysis on Memorial Day. Denies chest pain or shortness of breath.  Of note, she has R mid lob opacity on 07/22/23 and was treated for CAP with Cefdinir  and Azithromycin  at that time.   PERTINENT  PMH / PSH: OSA, recurrent PNA, GERD  OBJECTIVE:  BP 127/70   Pulse 71   Temp 97.7 F (36.5 C)   Ht 5' 5.5" (1.664 m)   Wt (!) 375 lb 6.4 oz (170.3 kg)   LMP 10/16/2013 (Approximate)   SpO2 98%   BMI 61.52 kg/m  Gen: well-appearing, NAD Pulm: diminished breath sounds in left lower lobe, right lung sounds clear  ASSESSMENT/PLAN:   Assessment & Plan Recurrent pneumonia Reviewed most recent pulmonology and [facility] notes regarding recurrent pneumonia.  She was appropriately treated for right mid lobe pneumonia with cefdinir  and azithromycin .  Obtain chest x-ray to see if this resolved and additionally because of diminished breath sounds in left lower lobe.  She appears well otherwise.  Additionally, I am concerned that she has hemoptysis.  Strongly recommended she schedule follow-up appointment with pulmonology as I suspect she may need CT and given the recurrent pneumonia would want to know if they would consider bronchoscopy.  Should pneumonia not have improved, consider treating with moxifloxacin  for anaerobic coverage.  Veronia Goon, DO 09/02/2023, 10:50 AM PGY-3, [facility]

## 2023-09-02 ENCOUNTER — Ambulatory Visit
Admission: RE | Admit: 2023-09-02 | Discharge: 2023-09-02 | Disposition: A | Discharge status: Home / Self Care | Source: Ambulatory Visit | Attending: Family Medicine | Admitting: Family Medicine

## 2023-09-02 ENCOUNTER — Ambulatory Visit: Admitting: Student

## 2023-09-02 VITALS — BP 127/70 | HR 71 | Temp 97.7°F | Ht 65.5 in | Wt 375.4 lb

## 2023-09-02 DIAGNOSIS — J189 Pneumonia, unspecified organism: Secondary | ICD-10-CM | POA: Diagnosis not present

## 2023-09-02 DIAGNOSIS — R042 Hemoptysis: Secondary | ICD-10-CM | POA: Diagnosis not present

## 2023-09-02 NOTE — Patient Instructions (Signed)
 It was great to see you today! Thank you for choosing [facility] for your primary care.  Today we addressed: I have placed an order for chest xray for.  Please go to [facility] at [employer] to have this completed.  You do not need an appointment, but if you would like to call them beforehand, their number is [phone].  We will contact you with your results afterwards.   Please schedule follow up with pulmonology.  If you haven't already, sign up for My Chart to have easy access to your labs results, and communication with your primary care physician.  Please arrive 15 minutes before your appointment to ensure smooth check in process.  We appreciate your efforts in making this happen.  Thank you for allowing me to participate in your care, Veronia Goon, DO 09/02/2023, 10:18 AM PGY-3, [facility]

## 2023-09-02 NOTE — Assessment & Plan Note (Signed)
 Reviewed most recent pulmonology and [facility] notes regarding recurrent pneumonia.  She was appropriately treated for right mid lobe pneumonia with cefdinir  and azithromycin .  Obtain chest x-ray to see if this resolved and additionally because of diminished breath sounds in left lower lobe.  She appears well otherwise.  Additionally, I am concerned that she has hemoptysis.  Strongly recommended she schedule follow-up appointment with pulmonology as I suspect she may need CT and given the recurrent pneumonia would want to know if they would consider bronchoscopy.  Should pneumonia not have improved, consider treating with moxifloxacin  for anaerobic coverage.  Addendum: Called on 09/04/2023, patient notes she has been feeling better although she did have 1 more episode of very limited hemoptysis.  Preliminary read of chest x-ray is improvement of right mid lobe and I do not see any other signs of pneumonia.  Recommend checking CBC, lab ordered and patient scheduled on morning of 09/07/2023.  She appreciated phone call.

## 2023-09-03 ENCOUNTER — Ambulatory Visit (HOSPITAL_BASED_OUTPATIENT_CLINIC_OR_DEPARTMENT_OTHER)

## 2023-09-04 ENCOUNTER — Other Ambulatory Visit: Payer: Self-pay | Admitting: Student

## 2023-09-04 DIAGNOSIS — J189 Pneumonia, unspecified organism: Secondary | ICD-10-CM

## 2023-09-07 ENCOUNTER — Telehealth: Payer: Self-pay

## 2023-09-07 ENCOUNTER — Ambulatory Visit (INDEPENDENT_AMBULATORY_CARE_PROVIDER_SITE_OTHER)

## 2023-09-07 ENCOUNTER — Other Ambulatory Visit: Payer: Self-pay

## 2023-09-07 VITALS — Ht 65.5 in | Wt 370.0 lb

## 2023-09-07 DIAGNOSIS — J189 Pneumonia, unspecified organism: Secondary | ICD-10-CM | POA: Diagnosis not present

## 2023-09-07 DIAGNOSIS — Z0000 Encounter for general adult medical examination without abnormal findings: Secondary | ICD-10-CM

## 2023-09-07 NOTE — Telephone Encounter (Signed)
 CXR has not been read - therefore there are no results. If she is feeling better that is reassuring. Can anyone see her sooner for acute visit if she feels worse?

## 2023-09-07 NOTE — Patient Instructions (Signed)
 [name] , Thank you for taking time out of your busy schedule to complete your Annual Wellness Visit with me. I enjoyed our conversation and look forward to speaking with you again next year. I, as well as your care team,  appreciate your ongoing commitment to your health goals. Please review the following plan we discussed and let me know if I can assist you in the future. Your Game plan/ To Do List    Referrals: If you haven't heard from the office you've been referred to, please reach out to them at the phone provided.  None at this time. Follow up Visits: Next Medicare AWV with our clinical staff: 09/08/2024 at 2:10 pm Phone Visit with Nurse Health Advisor   Have you seen your provider in the last 6 months (3 months if uncontrolled diabetes)? Yes, last seen 09/02/23 Next Office Visit with your provider: Patient will schedule as needed  Clinician Recommendations:  Aim for 30 minutes of exercise or brisk walking, 6-8 glasses of water, and 5 servings of fruits and vegetables each day.       This is a list of the screening recommended for you and due dates:  Health Maintenance  Topic Date Due   Zoster (Shingles) Vaccine (1 of 2) Never done   COVID-19 Vaccine (4 - 2024-25 season) 12/07/2022   Colon Cancer Screening  07/20/2023   Flu Shot  11/06/2023   Medicare Annual Wellness Visit  09/06/2024   Mammogram  05/10/2025   DTaP/Tdap/Td vaccine (3 - Td or Tdap) 05/30/2032   Pneumococcal Vaccination  Completed   Hepatitis C Screening  Completed   HIV Screening  Completed   HPV Vaccine  Aged Out   Meningitis B Vaccine  Aged Out    Advanced directives: (Declined) Advance directive discussed with you today. Even though you declined this today, please call our office should you change your mind, and we can give you the proper paperwork for you to fill out. Advance Care Planning is important because it:  [x]  Makes sure you receive the medical care that is consistent with your values, goals,  and preferences  [x]  It provides guidance to your family and loved ones and reduces their decisional burden about whether or not they are making the right decisions based on your wishes.  Follow the link provided in your after visit summary or read over the paperwork we have mailed to you to help you started getting your Advance Directives in place. If you need assistance in completing these, please reach out to us  so that we can help you!  See attachments for Preventive Care and Fall Prevention Tips.

## 2023-09-07 NOTE — Progress Notes (Signed)
 Because this visit was a virtual/telehealth visit,  certain criteria was not obtained, such a blood pressure, CBG if applicable, and timed get up and go. Any medications not marked as "taking" were not mentioned during the medication reconciliation part of the visit. Any vitals not documented were not able to be obtained due to this being a telehealth visit or patient was unable to self-report a recent blood pressure reading due to a lack of equipment at home via telehealth. Vitals that have been documented are verbally provided by the patient.   Subjective:   [name] is a 58 y.o. who presents for a Medicare Wellness preventive visit.  As a reminder, Annual Wellness Visits don't include a physical exam, and some assessments may be limited, especially if this visit is performed virtually. We may recommend an in-person follow-up visit with your provider if needed.  Visit Complete: Virtual I connected with  [name] on 09/07/23 by a audio enabled telemedicine application and verified that I am speaking with the correct person using two identifiers.  Patient Location: Home  Provider Location: Office/Clinic  I discussed the limitations of evaluation and management by telemedicine. The patient expressed understanding and agreed to proceed.  Vital Signs: Because this visit was a virtual/telehealth visit, some criteria may be missing or patient reported. Any vitals not documented were not able to be obtained and vitals that have been documented are patient reported.  VideoDeclined- This patient declined Librarian, academic. Therefore the visit was completed with audio only.  Persons Participating in Visit: Patient.  AWV Questionnaire: No: Patient Medicare AWV questionnaire was not completed prior to this visit.  Cardiac Risk Factors include: advanced age (>23men, >53 women);dyslipidemia;hypertension;family history of premature cardiovascular  disease;obesity (BMI >30kg/m2)     Objective:     Today's Vitals   09/07/23 1303  Weight: (!) 370 lb (167.8 kg)  Height: 5' 5.5" (1.664 m)  PainSc: 6   PainLoc: Knee   Body mass index is 60.63 kg/m.     09/07/2023    1:07 PM 07/10/2023   10:26 AM 06/02/2023   10:18 AM 05/21/2023   10:29 AM 02/05/2023   10:38 AM 10/20/2022   10:15 AM 09/19/2022   12:30 PM  Advanced Directives  Does Patient Have a Medical Advance Directive? No No No No No No No  Would patient like information on creating a medical advance directive? No - Patient declined No - Patient declined No - Patient declined No - Patient declined No - Patient declined No - Patient declined Yes (MAU/Ambulatory/Procedural Areas - Information given)    Current Medications (verified) Outpatient Encounter Medications as of 09/07/2023  Medication Sig   acetaminophen  (TYLENOL ) 500 MG tablet Take 2 tablets (1,000 mg total) by mouth every 6 (six) hours as needed for moderate pain.   albuterol  (PROVENTIL ) (2.5 MG/3ML) 0.083% nebulizer solution Take 3 mLs (2.5 mg total) by nebulization every 6 (six) hours as needed for wheezing or shortness of breath.   albuterol  (VENTOLIN  HFA) 108 (90 Base) MCG/ACT inhaler INHALE 2 PUFFS INTO THE LUNGS EVERY 6 HOURS AS NEEDED FOR WHEEZING OR SHORTNESS OF BREATH   amLODipine -olmesartan  (AZOR ) 5-20 MG tablet Take 1 tablet by mouth daily.   baclofen  (LIORESAL ) 10 MG tablet Take 1 tablet (10 mg total) by mouth 2 (two) times daily as needed for muscle spasms.   benzonatate  (TESSALON ) 100 MG capsule Take 1 capsule (100 mg total) by mouth 3 (three) times daily as needed for cough.  cefdinir  (OMNICEF ) 300 MG capsule Take 1 capsule (300 mg total) by mouth 2 (two) times daily.   cetirizine  (ZYRTEC ) 10 MG tablet Take 10 mg by mouth daily.   Cholecalciferol (VITAMIN D) 50 MCG (2000 UT) CAPS Take 2,000 Units by mouth daily.   diclofenac  Sodium (VOLTAREN ) 1 % GEL Apply to knee once daily as needed (Patient taking  differently: Apply 4 g topically daily.)   ezetimibe  (ZETIA ) 10 MG tablet Take 1 tablet (10 mg total) by mouth daily.   ferrous sulfate  325 (65 FE) MG EC tablet Take 1 tablet (325 mg total) by mouth daily with breakfast. (Patient taking differently: Take 325 mg by mouth every other day.)   fesoterodine  (TOVIAZ ) 8 MG TB24 tablet Take 1 tablet (8 mg total) by mouth daily.   fluticasone  (FLONASE ) 50 MCG/ACT nasal spray Place 2 sprays into both nostrils in the morning and at bedtime.   folic acid  (FOLVITE ) 1 MG tablet Take 1 tablet (1 mg total) by mouth daily.   furosemide  (LASIX ) 20 MG tablet Take 1 tablet (20 mg total) by mouth daily as needed for edema or fluid.   naltrexone (DEPADE) 50 MG tablet Take 25 mg by mouth daily.   omeprazole  (PRILOSEC) 40 MG capsule TAKE 1 CAPSULE(40 MG) BY MOUTH IN THE MORNING AND AT BEDTIME   ondansetron  (ZOFRAN ) 4 MG tablet Take 1 tablet (4 mg total) by mouth every 6 (six) hours as needed for nausea.   OXYGEN Inhale into the lungs.   Semaglutide -Weight Management (WEGOVY ) 0.5 MG/0.5ML SOAJ Inject 0.5 mg into the skin once a week.   Tiotropium Bromide-Olodaterol (STIOLTO RESPIMAT ) 2.5-2.5 MCG/ACT AERS Inhale 2 puffs into the lungs daily.   triamcinolone  cream (KENALOG ) 0.1 % Apply 1 Application topically 2 (two) times daily as needed (Skin irritation.).   valACYclovir  (VALTREX ) 500 MG tablet TAKE 1 TABLET(500 MG) BY MOUTH TWICE DAILY FOR 3 DAYS AT START OF OUTBREAK   XARELTO  20 MG TABS tablet TAKE 1 TABLET(20 MG) BY MOUTH IN THE MORNING   Zoster Vaccine Adjuvanted (SHINGRIX ) injection Administer Shingrix  vaccination now and repeat in two months   No facility-administered encounter medications on file as of 09/07/2023.    Allergies (verified) Tape and Penicillins   History: Past Medical History:  Diagnosis Date   Allergy    Anemia    Anxiety    Arthritis    Blood transfusion without reported diagnosis    Cough    Cough 06/11/2018   Difficulty sleeping  06/11/2018   Diverticulitis with perforation 2010   DVT (deep vein thrombosis) in pregnancy 11/24/2016   DVT, lower extremity (HCC)    Endometrial cancer (HCC)    Enlarged lymph nodes 08/13/2017   Family history of adverse reaction to anesthesia    pts daughter had difficulty awakening following anesthesia, long time to wake up   GERD (gastroesophageal reflux disease)    Headache    Hemoptysis 04/22/2018   History of bronchitis    History of ear infections    Hospital discharge follow-up 03/02/2019   Hx of migraines    Hyperlipidemia    Hypertension    IBS (irritable bowel syndrome)    Kidney infection    Lupus (systemic lupus erythematosus) (HCC) 02/2017   Morbid obesity (HCC)    Neck pain 01/13/2019   Ovarian cancer (HCC) dx'd 01/2015   PONV (postoperative nausea and vomiting)    Port-A-Cath in place    Right side   Portacath in place 09/12/2015   Pre-diabetes  Pyelonephritis    Right knee pain 11/28/2011   Scleroderma (HCC)    Screen for colon cancer 03/02/2019   Slow rate of speech 08/22/2015   Chronic from CVA.  She also has loss of left nasolabial fold and slight left facial droop/small asymmetry on the left side secondary to CVA   UTI (urinary tract infection) 05/07/2018   Past Surgical History:  Procedure Laterality Date   ABDOMINAL HYSTERECTOMY N/A 03/13/2015   Procedure: TOTAL HYSTERECTOMY ABDOMINAL BILATERAL SALPINGO OOPHORECTOMY RADICAL TUMOR DEBUKING;  Surgeon: Alphonso Aschoff, MD;  Location: [facility];  Service: Gynecology;  Laterality: N/A;   BOWEL RESECTION  03/13/2015   Procedure: SMALL BOWEL RESECTION;  Surgeon: Alphonso Aschoff, MD;  Location: [facility];  Service: Gynecology;;   CESAREAN SECTION  1983 and 1984   COLONOSCOPY WITH PROPOFOL  N/A 07/19/2020   Procedure: COLONOSCOPY WITH PROPOFOL ;  Surgeon: Albertina Hugger, MD;  Location: [facility];  Service: Gastroenterology;  Laterality: N/A;   COLOSTOMY  04/07/2006   COLOSTOMY TAKEDOWN     ESOPHAGOGASTRODUODENOSCOPY (EGD)  WITH PROPOFOL  N/A 12/21/2018   Procedure: ESOPHAGOGASTRODUODENOSCOPY (EGD) WITH PROPOFOL ;  Surgeon: Albertina Hugger, MD;  Location: [facility];  Service: Gastroenterology;  Laterality: N/A;   HEMOSTASIS CLIP PLACEMENT  07/19/2020   Procedure: HEMOSTASIS CLIP PLACEMENT;  Surgeon: Albertina Hugger, MD;  Location: [facility];  Service: Gastroenterology;;   IR REMOVAL TUN ACCESS W/ PORT W/O FL MOD SED  12/17/2022   LAPAROTOMY N/A 03/13/2015   Procedure: EXPLORATORY LAPAROTOMY;  Surgeon: Alphonso Aschoff, MD;  Location: [facility];  Service: Gynecology;  Laterality: N/A;   POLYPECTOMY  07/19/2020   Procedure: POLYPECTOMY;  Surgeon: Albertina Hugger, MD;  Location: [facility];  Service: Gastroenterology;;   TONSILLECTOMY AND ADENOIDECTOMY  04/08/1995   VENTRAL HERNIA REPAIR  03/13/2015   Procedure: HERNIA REPAIR VENTRAL ADULT;  Surgeon: Alphonso Aschoff, MD;  Location: [facility];  Service: Gynecology;;   Family History  Problem Relation Age of Onset   Diabetes Mother    Hypertension Mother    Hyperlipidemia Mother    Colon polyps Mother        4 total   Fibroids Mother        s/p hysterectomy   Hyperlipidemia Father    Lung cancer Father    Kidney failure Maternal Grandmother 39   Pancreatic cancer Paternal Grandmother        dx. >50   Diabetes Paternal Grandfather    Diabetes Daughter    Hodgkin's lymphoma Daughter 31   Asthma Maternal Aunt        severe - d. 24   Prostate cancer Maternal Uncle 38   Diabetes Paternal Aunt    Diabetes Paternal Aunt    Diabetes Paternal Uncle    Heart disease Neg Hx    Stroke Neg Hx    Colon cancer Neg Hx    Stomach cancer Neg Hx    Esophageal cancer Neg Hx    Rectal cancer Neg Hx    Social History   Socioeconomic History   Marital status: Legally Separated    Spouse name: Not on file   Number of children: Not on file   Years of education: Not on file   Highest education level: Some college, no degree  Occupational History   Not on file   Tobacco Use   Smoking status: Never   Smokeless tobacco: Never  Vaping Use   Vaping status: Never Used  Substance and Sexual Activity  Alcohol use: Yes    Comment: Maybe wine every 6 months   Drug use: No   Sexual activity: Yes    Partners: Male    Comment: Told she could not have more children in 1986  Other Topics Concern   Not on file  Social History Narrative   Works part time at [employer] (13 years as of 2015) - former Merchandiser, retail, 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   Married x 23 years, recently separated (4/15).  No domestic violence.  + financial stress.     Lives previously with husband who moved out, now with daughter who has medical problems, including Hodgkin's disease, and granddaughter (age 61).     Uses seat belt.    Social Drivers of Corporate investment banker Strain: Low Risk  (09/07/2023)   Overall Financial Resource Strain (CARDIA)    Difficulty of Paying Living Expenses: Not very hard  Food Insecurity: No Food Insecurity (09/07/2023)   Hunger Vital Sign    Worried About Running Out of Food in the Last Year: Never true    Ran Out of Food in the Last Year: Never true  Transportation Needs: No Transportation Needs (09/07/2023)   PRAPARE - Administrator, Civil Service (Medical): No    Lack of Transportation (Non-Medical): No  Physical Activity: Sufficiently Active (09/07/2023)   Exercise Vital Sign    Days of Exercise per Week: 5 days    Minutes of Exercise per Session: 30 min  Recent Concern: Physical Activity - Insufficiently Active (08/26/2023)   Received [facility]   Exercise Vital Sign    Days of Exercise per Week: 4 days    Minutes of Exercise per Session: 30 min  Stress: No Stress Concern Present (09/07/2023)   [employer] of Occupational Health - Occupational Stress Questionnaire    Feeling of Stress : Not at all  Social Connections: Socially Integrated (09/07/2023)   Social Connection and Isolation Panel [NHANES]     Frequency of Communication with Friends and Family: More than three times a week    Frequency of Social Gatherings with Friends and Family: Twice a week    Attends Religious Services: More than 4 times per year    Active Member of [employer] or Organizations: Yes    Attends Engineer, structural: More than 4 times per year    Marital Status: Married    Tobacco Counseling Counseling given: Not Answered    Clinical Intake:  Pre-visit preparation completed: Yes  Pain : 0-10 Pain Score: 6  Pain Type: Acute pain Pain Location: Knee Pain Orientation: Left     BMI - recorded: 60.63 Nutritional Risks: None Diabetes: No  Lab Results  Component Value Date   HGBA1C 5.6 06/26/2022   HGBA1C 5.5 07/20/2020   HGBA1C 5.7 (H) 01/01/2018     How often do you need to have someone help you when you read instructions, pamphlets, or other written materials from your doctor or pharmacy?: 1 - Never What is the last grade level you completed in school?: HSG  Interpreter Needed?: No  Information entered by :: Druscilla Gerhard, LPN.   Activities of Daily Living     09/07/2023    1:09 PM 09/19/2022   12:29 PM  In your present state of health, do you have any difficulty performing the following activities:  Hearing? 0 0  Vision? 0 0  Difficulty concentrating or making decisions? 0 0  Comment GOOD RECALL; LOVES  TO READ, PUZZLE BOOKS, ETC.   Walking or climbing stairs? 1 1  Comment CANE   Dressing or bathing? 0 0  Doing errands, shopping? 0 1  Comment  ati times due to resp. prob  Preparing Food and eating ? N N  Using the Toilet? N N  In the past six months, have you accidently leaked urine? Y N  Comment ON MEDICATION (TOVIAZ )   Do you have problems with loss of bowel control? N N  Managing your Medications? N N  Managing your Finances? N N  Housekeeping or managing your Housekeeping? N N    Patient Care Team: Candee Cha, MD as PCP - General (Family  Medicine) Euell Herrlich, MD as PCP - Cardiology (Cardiology) Duke, Warren Haber, PA as Physician Assistant (Cardiology) Hunsucker, Archer Kobs, MD as Consulting Physician (Pulmonary Disease)  I have updated your Care Teams any recent Medical Services you may have received from other providers in the past year.     Assessment:    This is a routine wellness examination for Virgil.  Hearing/Vision screen Hearing Screening - Comments:: Adequate hearing. Vision Screening - Comments:: Wears rx glasses - up to date with routine eye exams with [facility] [address]    Goals Addressed             This Visit's Progress    09/07/2023: My goal is to lose 50+ pounds in 1 year by staying physically active and eating healthier choices.         Depression Screen     09/07/2023    1:11 PM 09/02/2023    9:59 AM 06/02/2023   10:19 AM 05/21/2023   10:29 AM 03/12/2023    1:48 PM 02/05/2023   10:37 AM 10/20/2022   10:14 AM  PHQ 2/9 Scores  PHQ - 2 Score 0 0 0 0 0 0 0  PHQ- 9 Score 1 0 0 0 0 0 0    Fall Risk     09/07/2023    1:08 PM 05/21/2023    9:56 AM 02/05/2023   10:37 AM 11/11/2022    9:57 AM 10/20/2022   10:14 AM  Fall Risk   Falls in the past year? 1 0 0 0 0  Number falls in past yr: 0 0 0 0 0  Injury with Fall? 1 0 0 0 0  Risk for fall due to : Orthopedic patient No Fall Risks     Follow up Falls evaluation completed;Education provided Falls evaluation completed       MEDICARE RISK AT HOME:  Medicare Risk at Home Any stairs in or around the home?: Yes (FRONT AND BACK ENTRY) If so, are there any without handrails?: No Home free of loose throw rugs in walkways, pet beds, electrical cords, etc?: Yes Adequate lighting in your home to reduce risk of falls?: Yes Life alert?: No Use of a cane, walker or w/c?: Yes Grab bars in the bathroom?: Yes Shower chair or bench in shower?: No Elevated toilet seat or a handicapped toilet?: No  TIMED UP AND GO:  Was the test performed?   No  Cognitive Function: 6CIT completed    09/07/2023    1:18 PM  MMSE - Mini Mental State Exam  Not completed: Unable to complete        09/07/2023    1:20 PM 09/19/2022   12:30 PM  6CIT Screen  What Year? 0 points 0 points  What month? 0 points 0 points  What time?  0 points 0 points  Count back from 20 0 points 0 points  Months in reverse 0 points 0 points  Repeat phrase 0 points 0 points  Total Score 0 points 0 points    Immunizations Immunization History  Administered Date(s) Administered   Influenza Split 06/10/2011   Influenza, Seasonal, Injecte, Preservative Fre 02/05/2023   Influenza,inj,Quad PF,6+ Mos 12/12/2014, 01/17/2016, 12/04/2016, 01/14/2018, 02/01/2019, 01/05/2020, 12/20/2020, 01/09/2022   Moderna Sars-Covid-2 Vaccination 06/24/2019, 07/25/2019   PFIZER(Purple Top)SARS-COV-2 Vaccination 03/15/2020   PNEUMOCOCCAL CONJUGATE-20 12/20/2020   PPD Test 02/02/2019   Td 07/11/2008   Tdap 05/30/2022    Screening Tests Health Maintenance  Topic Date Due   Zoster Vaccines- Shingrix  (1 of 2) Never done   COVID-19 Vaccine (4 - 2024-25 season) 12/07/2022   Colonoscopy  07/20/2023   INFLUENZA VACCINE  11/06/2023   Medicare Annual Wellness (AWV)  09/06/2024   MAMMOGRAM  05/10/2025   DTaP/Tdap/Td (3 - Td or Tdap) 05/30/2032   Pneumococcal Vaccine 16-61 Years old  Completed   Hepatitis C Screening  Completed   HIV Screening  Completed   HPV VACCINES  Aged Out   Meningococcal B Vaccine  Aged Out    Health Maintenance  Health Maintenance Due  Topic Date Due   Zoster Vaccines- Shingrix  (1 of 2) Never done   COVID-19 Vaccine (4 - 2024-25 season) 12/07/2022   Colonoscopy  07/20/2023   Health Maintenance Items Addressed: Yes Patient aware of current care gaps.  Immunization record was verified by [facility] and updated in patient's chart.  Additional Screening:  Vision Screening: Recommended annual ophthalmology exams for early detection of glaucoma and other disorders  of the eye. Would you like a referral to an eye doctor? No    Dental Screening: Recommended annual dental exams for proper oral hygiene  Community Resource Referral / Chronic Care Management: CRR required this visit?  No   CCM required this visit?  No   Plan:    I have personally reviewed and noted the following in the patient's chart:   Medical and social history Use of alcohol, tobacco or illicit drugs  Current medications and supplements including opioid prescriptions. Patient is not currently taking opioid prescriptions. Functional ability and status Nutritional status Physical activity Advanced directives List of other physicians Hospitalizations, surgeries, and ER visits in previous 12 months Vitals Screenings to include cognitive, depression, and falls Referrals and appointments  In addition, I have reviewed and discussed with patient certain preventive protocols, quality metrics, and best practice recommendations. A written personalized care plan for preventive services as well as general preventive health recommendations were provided to patient.   Margette Sheldon, LPN   07/12/9627   After Visit Summary: (MyChart) Due to this being a telephonic visit, the after visit summary with patients personalized plan was offered to patient via MyChart   Notes: Patient aware of current care gaps.  Immunization record was verified by [facility] and updated in patient's chart.

## 2023-09-07 NOTE — Telephone Encounter (Signed)
 Copied from CRM [id number]. Topic: General - Other >> Sep 03, 2023 10:10 AM Eveleen Hinds B wrote: Reason for CRM: Patient calling in to see Dr Marygrace Snellen after having aspiration pneumonia.  Would like to see Dr Marygrace Snellen. Im unable to schedule....  Please call patient to schedule.  Called and spoke with pt. She seen fam med on 5/28 w/ x-ray. Pt states she is starting to feel better. Pt has not coughed up any blood. No chest pain or SOB. Pt is using inhalers & nebulizer. Using 2L O2 at night and using as needed during day time.  Dr. Marygrace Snellen can you advise of x-ray results and pt is concerned if she needs an antibiotic sent in.  She has been scheduled ov for 7/23.

## 2023-09-08 ENCOUNTER — Other Ambulatory Visit: Payer: Self-pay | Admitting: Nurse Practitioner

## 2023-09-08 ENCOUNTER — Ambulatory Visit: Payer: Self-pay | Admitting: Student

## 2023-09-08 LAB — CBC
Hematocrit: 35.4 % (ref 34.0–46.6)
Hemoglobin: 11.1 g/dL (ref 11.1–15.9)
MCH: 28.8 pg (ref 26.6–33.0)
MCHC: 31.4 g/dL — ABNORMAL LOW (ref 31.5–35.7)
MCV: 92 fL (ref 79–97)
Platelets: 215 10*3/uL (ref 150–450)
RBC: 3.85 x10E6/uL (ref 3.77–5.28)
RDW: 14.7 % (ref 11.7–15.4)
WBC: 7.1 10*3/uL (ref 3.4–10.8)

## 2023-09-08 NOTE — Telephone Encounter (Signed)
 I am glad she is doing well.  I will not get the results as I did not order the test. Her PCP office will get the results. If she gets notification of results she is welcome to contact us  and I can review at a time I am available.

## 2023-09-08 NOTE — Telephone Encounter (Signed)
Normal results discussed with patient.

## 2023-09-08 NOTE — Telephone Encounter (Signed)
 Called and spoke with the pt. She states she is still feeling well and is staying active. Denies coughing up blood, no chest pain or SOB. Pt attended water aerobics this morning and had no problems. Pt would like to know x-ray results from Dr. Marygrace Snellen once these are available.

## 2023-09-10 ENCOUNTER — Ambulatory Visit: Payer: Self-pay | Admitting: Student

## 2023-09-15 ENCOUNTER — Ambulatory Visit (INDEPENDENT_AMBULATORY_CARE_PROVIDER_SITE_OTHER): Admitting: Family Medicine

## 2023-09-15 ENCOUNTER — Encounter: Payer: Self-pay | Admitting: Family Medicine

## 2023-09-15 VITALS — BP 138/70 | Ht 65.5 in | Wt 363.0 lb

## 2023-09-15 DIAGNOSIS — M1711 Unilateral primary osteoarthritis, right knee: Secondary | ICD-10-CM | POA: Diagnosis not present

## 2023-09-15 DIAGNOSIS — M545 Low back pain, unspecified: Secondary | ICD-10-CM | POA: Diagnosis not present

## 2023-09-15 NOTE — Progress Notes (Signed)
 DATE OF VISIT: 09/15/2023        [name] DOB: December 05, 1965 MRN: [id number]  CC:  f/u Rt knee pain, low back pain  History of present Illness: [name] is a 58 y.o. female who presents for a follow-up visit for Rt knee and low back pain Last seen by me 07/16/23 Has been doing regular PT at [facility] - completed 9 sessions of land and aquatic PT Joined [facility] - going to gym about 2-3 days/wk - missed a week recently when going to the beach [facility]) Knee more bothersome than back, but still doing well Has been losing weight - down about 7-lbs - scheduled for f/u with wt loss doctor to increase medication dosage Overall she is feeling good and likes the way she is feeling  Medications:  Outpatient Encounter Medications as of 09/15/2023  Medication Sig   acetaminophen  (TYLENOL ) 500 MG tablet Take 2 tablets (1,000 mg total) by mouth every 6 (six) hours as needed for moderate pain.   albuterol  (PROVENTIL ) (2.5 MG/3ML) 0.083% nebulizer solution Take 3 mLs (2.5 mg total) by nebulization every 6 (six) hours as needed for wheezing or shortness of breath.   albuterol  (VENTOLIN  HFA) 108 (90 Base) MCG/ACT inhaler INHALE 2 PUFFS INTO THE LUNGS EVERY 6 HOURS AS NEEDED FOR WHEEZING OR SHORTNESS OF BREATH   amLODipine -olmesartan  (AZOR ) 5-20 MG tablet Take 1 tablet by mouth daily.   baclofen  (LIORESAL ) 10 MG tablet Take 1 tablet (10 mg total) by mouth 2 (two) times daily as needed for muscle spasms.   benzonatate  (TESSALON ) 100 MG capsule Take 1 capsule (100 mg total) by mouth 3 (three) times daily as needed for cough.   cefdinir  (OMNICEF ) 300 MG capsule Take 1 capsule (300 mg total) by mouth 2 (two) times daily.   cetirizine  (ZYRTEC ) 10 MG tablet Take 10 mg by mouth daily.   Cholecalciferol (VITAMIN D) 50 MCG (2000 UT) CAPS Take 2,000 Units by mouth daily.   diclofenac  Sodium (VOLTAREN ) 1 % GEL Apply to knee once daily as needed (Patient taking differently: Apply 4 g topically  daily.)   ezetimibe  (ZETIA ) 10 MG tablet Take 1 tablet (10 mg total) by mouth daily.   ferrous sulfate  325 (65 FE) MG EC tablet Take 1 tablet (325 mg total) by mouth daily with breakfast. (Patient taking differently: Take 325 mg by mouth every other day.)   fesoterodine  (TOVIAZ ) 8 MG TB24 tablet Take 1 tablet (8 mg total) by mouth daily.   fluticasone  (FLONASE ) 50 MCG/ACT nasal spray Place 2 sprays into both nostrils in the morning and at bedtime.   folic acid  (FOLVITE ) 1 MG tablet Take 1 tablet (1 mg total) by mouth daily.   furosemide  (LASIX ) 20 MG tablet Take 1 tablet (20 mg total) by mouth daily as needed for edema or fluid.   naltrexone (DEPADE) 50 MG tablet Take 25 mg by mouth daily.   omeprazole  (PRILOSEC) 40 MG capsule TAKE 1 CAPSULE(40 MG) BY MOUTH IN THE MORNING AND AT BEDTIME   ondansetron  (ZOFRAN ) 4 MG tablet Take 1 tablet (4 mg total) by mouth every 6 (six) hours as needed for nausea.   OXYGEN Inhale into the lungs.   Semaglutide -Weight Management (WEGOVY ) 0.5 MG/0.5ML SOAJ Inject 0.5 mg into the skin once a week.   Tiotropium Bromide-Olodaterol (STIOLTO RESPIMAT ) 2.5-2.5 MCG/ACT AERS Inhale 2 puffs into the lungs daily.   triamcinolone  cream (KENALOG ) 0.1 % Apply 1 Application topically 2 (two) times daily as needed (Skin irritation.).  valACYclovir  (VALTREX ) 500 MG tablet TAKE 1 TABLET(500 MG) BY MOUTH TWICE DAILY FOR 3 DAYS AT START OF OUTBREAK   XARELTO  20 MG TABS tablet TAKE 1 TABLET(20 MG) BY MOUTH IN THE MORNING   Zoster Vaccine Adjuvanted (SHINGRIX ) injection Administer Shingrix  vaccination now and repeat in two months   No facility-administered encounter medications on file as of 09/15/2023.    Allergies: is allergic to tape and penicillins.  Physical Examination: Vitals: BP 138/70   Ht 5' 5.5" (1.664 m)   Wt (!) 363 lb (164.7 kg)   LMP 10/16/2013 (Approximate)   BMI 59.49 kg/m  GENERAL:  [name] is a 58 y.o. female appearing their stated age, alert and  oriented x 3, in no apparent distress.  SKIN: no rashes or lesions, skin clean, dry, intact MSK:  Knee: Bilateral knees without swelling or effusion.  Mild medial joint line tenderness in both knees.  Good range of motion without pain.  L-spine: No acute deformity.  No midline tenderness.  Good range of motion.  Lower extremity strength 5/5 bilaterally Normal gait Neurovascularly intact distally  Assessment & Plan Primary osteoarthritis of right knee Greatly improved, but still some pain.  Status post cortisone injection 06/04/2023  Plan: - Should continue home exercise program and continue to go to the gym regularly - Continued weight loss will significantly help her knee pain - Can continue Tylenol  or Voltaren  gel as needed - Follow-up as needed, could consider repeat cortisone injection if worsening knee pain or could consider HA injection Acute right-sided low back pain without sciatica Greatly improved  Plan: - Should continue with home exercise program and should continue to go to the gym regularly - Can continue Voltaren  gel and Salonpas as needed - Follow-up with me as needed Morbid obesity (HCC) Has been losing weight and is feeling much better  Plan: - Continue follow-up with weight loss physicians, continue current regimen - Reassured patient that even a few pounds of weight loss will significantly decrease the forces across her knee and the back and should help with her symptoms   Patient expressed understanding & agreement with above.  Encounter Diagnoses  Name Primary?   Primary osteoarthritis of right knee Yes   Acute right-sided low back pain without sciatica    Morbid obesity (HCC)     No orders of the defined types were placed in this encounter.

## 2023-09-15 NOTE — Assessment & Plan Note (Signed)
 Greatly improved, but still some pain.  Status post cortisone injection 06/04/2023  Plan: - Should continue home exercise program and continue to go to the gym regularly - Continued weight loss will significantly help her knee pain - Can continue Tylenol  or Voltaren  gel as needed - Follow-up as needed, could consider repeat cortisone injection if worsening knee pain or could consider HA injection

## 2023-09-15 NOTE — Assessment & Plan Note (Signed)
 Has been losing weight and is feeling much better  Plan: - Continue follow-up with weight loss physicians, continue current regimen - Reassured patient that even a few pounds of weight loss will significantly decrease the forces across her knee and the back and should help with her symptoms

## 2023-09-28 ENCOUNTER — Ambulatory Visit (INDEPENDENT_AMBULATORY_CARE_PROVIDER_SITE_OTHER): Admitting: Family Medicine

## 2023-09-28 ENCOUNTER — Encounter

## 2023-09-28 ENCOUNTER — Encounter: Payer: Self-pay | Admitting: Family Medicine

## 2023-09-28 DIAGNOSIS — R32 Unspecified urinary incontinence: Secondary | ICD-10-CM

## 2023-09-28 DIAGNOSIS — I10 Essential (primary) hypertension: Secondary | ICD-10-CM

## 2023-09-28 DIAGNOSIS — Z7901 Long term (current) use of anticoagulants: Secondary | ICD-10-CM

## 2023-09-28 MED ORDER — WEGOVY 1 MG/0.5ML ~~LOC~~ SOAJ: 1.0000 mg | SUBCUTANEOUS | 1 refills | Status: DC

## 2023-09-28 NOTE — Progress Notes (Unsigned)
  Date of Visit: 09/28/2023   SUBJECTIVE:   HPI:  [name] presents today for follow up of obesity.  Obesity - taking wegovy  0.5mg  weekly. Tolerating it well overall, did have some occasional GI upset that has resolved. Working out regularly, goes in pool as it's good for her knees and back, goes at least 3 times a week. Recently stopped physical therapy as completed the course. Wants to go up on dose of wegovy  to 1mg  weekly.  Urinary incontinence - taking toviaz  8mg  daily, works well for her.  Recurrent VTE - taking xarelto  20mg  daily. Recent CBC had stable Hgb. No bleeding.  OBJECTIVE:   BP 130/85   Pulse 86   Ht 5' 5.5 (1.664 m)   Wt (!) 363 lb 9.6 oz (164.9 kg)   LMP 10/16/2013 (Approximate)   SpO2 97%   BMI 59.59 kg/m  Gen: no acute distress, pleasant, cooperative, well appearing HEENT: normocephalic, atraumatic  Heart: regular rate and rhythm, no murmur Lungs: clear to auscultation bilaterally, normal work of breathing  Neuro: alert, speech normal, grossly nonfocal  ASSESSMENT/PLAN:   Assessment & Plan Morbid obesity (HCC) Doing well on wegovy , would benefit from higher dose Increase to 1mg  weekly, follow up in 1 month to monitor weight loss Essential hypertension, benign Well controlled. Continue current medication regimen.  Urinary incontinence, unspecified type Well controlled, continue toviaz  Chronic anticoagulation Stable on xarelto , continue    FOLLOW UP: Follow up in 1 mo for obesity  [place] J. Donah, MD [facility]

## 2023-09-28 NOTE — Patient Instructions (Signed)
 It was great to see you again today.  Go up on wegovy  to 1mg  weekly  Follow up with me in 1 month  Let me know if any issues with the medication   Be well, Dr. Donah

## 2023-09-30 NOTE — Assessment & Plan Note (Signed)
 Well controlled. Continue current medication regimen.

## 2023-09-30 NOTE — Assessment & Plan Note (Signed)
 Well controlled, continue toviaz 

## 2023-09-30 NOTE — Assessment & Plan Note (Signed)
 Stable on xarelto , continue

## 2023-09-30 NOTE — Assessment & Plan Note (Signed)
 Doing well on wegovy , would benefit from higher dose Increase to 1mg  weekly, follow up in 1 month to monitor weight loss

## 2023-10-23 ENCOUNTER — Telehealth: Payer: Self-pay

## 2023-10-23 MED ORDER — WEGOVY 1 MG/0.5ML ~~LOC~~ SOAJ: 1.0000 mg | SUBCUTANEOUS | 1 refills | Status: DC

## 2023-10-23 NOTE — Telephone Encounter (Signed)
 Rx sent in. Please ask patient to schedule follow up visit with me so we can monitor her weight and discuss possibly increasing dose further for better effect.  Thanks! Laymon JINNY Legions, MD

## 2023-10-23 NOTE — Telephone Encounter (Signed)
 Patient calls nurse requesting a refill on Wegovy .   She reports she has been on 1mg  for ~ 1 month. She reports no undesired side effects. No nausea, vomiting or constipation.   Advised will forward to PCP.

## 2023-10-26 NOTE — Telephone Encounter (Signed)
 Patient informed of rx sent it, follow up appointment has been scheduled. Cassell Mary CMA

## 2023-10-28 ENCOUNTER — Ambulatory Visit: Admitting: Pulmonary Disease

## 2023-10-28 ENCOUNTER — Encounter: Payer: Self-pay | Admitting: Pulmonary Disease

## 2023-10-28 DIAGNOSIS — I10 Essential (primary) hypertension: Secondary | ICD-10-CM | POA: Diagnosis not present

## 2023-10-28 DIAGNOSIS — E66813 Obesity, class 3: Secondary | ICD-10-CM | POA: Diagnosis not present

## 2023-10-28 DIAGNOSIS — Z6841 Body Mass Index (BMI) 40.0 and over, adult: Secondary | ICD-10-CM | POA: Diagnosis not present

## 2023-10-28 DIAGNOSIS — G4733 Obstructive sleep apnea (adult) (pediatric): Secondary | ICD-10-CM | POA: Diagnosis not present

## 2023-10-28 DIAGNOSIS — Z8701 Personal history of pneumonia (recurrent): Secondary | ICD-10-CM | POA: Diagnosis not present

## 2023-10-29 ENCOUNTER — Encounter: Payer: Self-pay | Admitting: Gastroenterology

## 2023-10-29 ENCOUNTER — Ambulatory Visit: Admitting: Gastroenterology

## 2023-10-29 VITALS — BP 124/80 | HR 88 | Ht 64.0 in | Wt 353.4 lb

## 2023-10-29 DIAGNOSIS — Z86711 Personal history of pulmonary embolism: Secondary | ICD-10-CM

## 2023-10-29 DIAGNOSIS — Z7901 Long term (current) use of anticoagulants: Secondary | ICD-10-CM

## 2023-10-29 DIAGNOSIS — Z86718 Personal history of other venous thrombosis and embolism: Secondary | ICD-10-CM | POA: Diagnosis not present

## 2023-10-29 DIAGNOSIS — K219 Gastro-esophageal reflux disease without esophagitis: Secondary | ICD-10-CM

## 2023-10-29 DIAGNOSIS — Z860100 Personal history of colon polyps, unspecified: Secondary | ICD-10-CM

## 2023-10-29 DIAGNOSIS — K449 Diaphragmatic hernia without obstruction or gangrene: Secondary | ICD-10-CM | POA: Diagnosis not present

## 2023-10-29 MED ORDER — FAMOTIDINE 40 MG PO TABS: 40.0000 mg | ORAL_TABLET | Freq: Every day | ORAL | 3 refills | Status: AC

## 2023-10-29 NOTE — Patient Instructions (Signed)
 Will call back to schedule.   _______________________________________________________  If your blood pressure at your visit was 140/90 or greater, please contact your primary care physician to follow up on this.  _______________________________________________________  If you are age 59 or older, your body mass index should be between 23-30. Your Body mass index is 60.66 kg/m. If this is out of the aforementioned range listed, please consider follow up with your Primary Care Provider.  If you are age 19 or younger, your body mass index should be between 19-25. Your Body mass index is 60.66 kg/m. If this is out of the aformentioned range listed, please consider follow up with your Primary Care Provider.   ________________________________________________________  The [facility] providers would like to encourage you to use MYCHART to communicate with providers for non-urgent requests or questions.  Due to long hold times on the telephone, sending your provider a message by [facility] may be a faster and more efficient way to get a response.  Please allow 48 business hours for a response.  Please remember that this is for non-urgent requests.  _______________________________________________________  [facility] is using a team-based approach to care.  Your team is made up of your doctor and two to three APPS. Our APPS (Nurse Practitioners and Physician Assistants) work with your physician to ensure care continuity for you. They are fully qualified to address your health concerns and develop a treatment plan. They communicate directly with your gastroenterologist to care for you. Seeing the Advanced Practice Practitioners on your physician's team can help you by facilitating care more promptly, often allowing for earlier appointments, access to diagnostic testing, procedures, and other specialty referrals.

## 2023-10-29 NOTE — Progress Notes (Signed)
 10/29/2023 [name] [id number] Aug 31, 1965   HISTORY OF PRESENT ILLNESS: This is a 58 year old female who has past medical history of uterine/ovarian cancer 9 years ago, DVT/PEs while undergoing chemotherapy for her malignancies and still remains on Xarelto , morbid obesity with BMI greater than 50, chronic oxygen use at home, GERD, recent recurrent aspiration pneumonia.  She is due for colonoscopy for history of colon polyps.  She tells me that her bariatric surgeon would like her to have another endoscopy as well.  She had a large hiatal hernia previously and has had some issues with recurrent aspiration pneumonia.  They are concerned that her hiatal hernia is causing a lot of acid reflux issues and that she is having this aspiration of acid contents occurring at nighttime.  The surgeon is considering a hernia repair at the time of any weight loss surgery if that were to occur.  She is on omeprazole  40 mg daily, which she takes in the evening with dinner.  She does not really feeling she has a lot of acid reflux issues during the day.  Colonoscopy 07/2020:  - Preparation of the colon was fair. - One 2 mm polyp at the splenic flexure, removed with a cold snare. Resected and retrieved. - One 6 mm polyp in the mid rectum, removed with a hot snare. Resected and retrieved. Clip (MR conditional) was placed. - The examination was otherwise normal on direct and retroflexion views.   A. COLON, TRANSVERSE, POLYPECTOMY:  - Polypoid colonic mucosa with no significant pathologic findings.  - Negative for dysplasia.   B. RECTUM, POLYPECTOMY:  - Tubular adenoma.  - Negative for high grade dysplasia.   EGD September 2020 showed a 5 cm hiatal hernia.   Past Medical History:  Diagnosis Date   Allergy    Anemia    Anxiety    Arthritis    Aspiration pneumonia (HCC)    Blood transfusion without reported diagnosis    Cough    Cough 06/11/2018   Difficulty sleeping 06/11/2018    Diverticulitis with perforation 2010   DVT (deep vein thrombosis) in pregnancy 11/24/2016   DVT, lower extremity (HCC)    Endometrial cancer (HCC)    Enlarged lymph nodes 08/13/2017   Family history of adverse reaction to anesthesia    pts daughter had difficulty awakening following anesthesia, long time to wake up   GERD (gastroesophageal reflux disease)    Headache    Hemoptysis 04/22/2018   History of bronchitis    History of ear infections    Hospital discharge follow-up 03/02/2019   Hx of migraines    Hyperlipidemia    Hypertension    IBS (irritable bowel syndrome)    Kidney infection    Lupus (systemic lupus erythematosus) (HCC) 02/2017   Morbid obesity (HCC)    Neck pain 01/13/2019   Ovarian cancer (HCC) dx'd 01/2015   PONV (postoperative nausea and vomiting)    Port-A-Cath in place    Right side   Portacath in place 09/12/2015   Pre-diabetes    Pyelonephritis    Right knee pain 11/28/2011   Scleroderma (HCC)    Screen for colon cancer 03/02/2019   Slow rate of speech 08/22/2015   Chronic from CVA.  She also has loss of left nasolabial fold and slight left facial droop/small asymmetry on the left side secondary to CVA   UTI (urinary tract infection) 05/07/2018   Past Surgical History:  Procedure Laterality Date   ABDOMINAL HYSTERECTOMY N/A 03/13/2015  Procedure: TOTAL HYSTERECTOMY ABDOMINAL BILATERAL SALPINGO OOPHORECTOMY RADICAL TUMOR DEBUKING;  Surgeon: Maurilio Ship, MD;  Location: [facility];  Service: Gynecology;  Laterality: N/A;   BOWEL RESECTION  03/13/2015   Procedure: SMALL BOWEL RESECTION;  Surgeon: Maurilio Ship, MD;  Location: [facility];  Service: Gynecology;;   CESAREAN SECTION  1983 and 1984   COLONOSCOPY WITH PROPOFOL  N/A 07/19/2020   Procedure: COLONOSCOPY WITH PROPOFOL ;  Surgeon: Legrand Victory LITTIE DOUGLAS, MD;  Location: [facility];  Service: Gastroenterology;  Laterality: N/A;   COLOSTOMY  04/07/2006   COLOSTOMY TAKEDOWN     ESOPHAGOGASTRODUODENOSCOPY (EGD) WITH  PROPOFOL  N/A 12/21/2018   Procedure: ESOPHAGOGASTRODUODENOSCOPY (EGD) WITH PROPOFOL ;  Surgeon: Legrand Victory LITTIE DOUGLAS, MD;  Location: [facility];  Service: Gastroenterology;  Laterality: N/A;   HEMOSTASIS CLIP PLACEMENT  07/19/2020   Procedure: HEMOSTASIS CLIP PLACEMENT;  Surgeon: Legrand Victory LITTIE DOUGLAS, MD;  Location: [facility];  Service: Gastroenterology;;   IR REMOVAL TUN ACCESS W/ PORT W/O FL MOD SED  12/17/2022   LAPAROTOMY N/A 03/13/2015   Procedure: EXPLORATORY LAPAROTOMY;  Surgeon: Maurilio Ship, MD;  Location: [facility];  Service: Gynecology;  Laterality: N/A;   POLYPECTOMY  07/19/2020   Procedure: POLYPECTOMY;  Surgeon: Legrand Victory LITTIE DOUGLAS, MD;  Location: [facility];  Service: Gastroenterology;;   TONSILLECTOMY AND ADENOIDECTOMY  04/08/1995   VENTRAL HERNIA REPAIR  03/13/2015   Procedure: HERNIA REPAIR VENTRAL ADULT;  Surgeon: Maurilio Ship, MD;  Location: [facility];  Service: Gynecology;;    reports that she has never smoked. She has never used smokeless tobacco. She reports current alcohol use. She reports that she does not use drugs. family history includes Asthma in her maternal aunt; Colon polyps in her mother; Diabetes in her daughter, mother, paternal aunt, paternal aunt, paternal grandfather, and paternal uncle; Fibroids in her mother; Hodgkin's lymphoma (age of onset: 47) in her daughter; Hyperlipidemia in her father and mother; Hypertension in her mother; Kidney failure (age of onset: 25) in her maternal grandmother; Lung cancer in her father; Pancreatic cancer in her paternal grandmother; Prostate cancer (age of onset: 25) in her maternal uncle. Allergies  Allergen Reactions   Tape Itching    Surgical tape   Penicillins Rash    Has patient had a PCN reaction causing immediate rash, facial/tongue/throat swelling, SOB or lightheadedness with hypotension: no Has patient had a PCN reaction causing severe rash involving mucus membranes or skin necrosis: no Has patient had a PCN reaction that  required hospitalization in the hospital at the time Has patient had a PCN reaction occurring within the last 10 years: no If all of the above answers are NO, then may proceed with Cephalosporin use.       Outpatient Encounter Medications as of 10/29/2023  Medication Sig   acetaminophen  (TYLENOL ) 500 MG tablet Take 2 tablets (1,000 mg total) by mouth every 6 (six) hours as needed for moderate pain.   albuterol  (PROVENTIL ) (2.5 MG/3ML) 0.083% nebulizer solution Take 3 mLs (2.5 mg total) by nebulization every 6 (six) hours as needed for wheezing or shortness of breath.   albuterol  (VENTOLIN  HFA) 108 (90 Base) MCG/ACT inhaler INHALE 2 PUFFS INTO THE LUNGS EVERY 6 HOURS AS NEEDED FOR WHEEZING OR SHORTNESS OF BREATH   amLODipine -olmesartan  (AZOR ) 5-20 MG tablet Take 1 tablet by mouth daily.   baclofen  (LIORESAL ) 10 MG tablet Take 1 tablet (10 mg total) by mouth 2 (two) times daily as needed for muscle spasms.   benzonatate  (TESSALON ) 100 MG capsule Take 1 capsule (  100 mg total) by mouth 3 (three) times daily as needed for cough.   cetirizine  (ZYRTEC ) 10 MG tablet Take 10 mg by mouth as needed.   Cholecalciferol (VITAMIN D) 50 MCG (2000 UT) CAPS Take 2,000 Units by mouth daily.   diclofenac  Sodium (VOLTAREN ) 1 % GEL Apply to knee once daily as needed   dicyclomine  (BENTYL ) 10 MG capsule Take 10 mg by mouth as needed.   ezetimibe  (ZETIA ) 10 MG tablet Take 1 tablet (10 mg total) by mouth daily.   ferrous sulfate  325 (65 FE) MG EC tablet Take 1 tablet (325 mg total) by mouth daily with breakfast. (Patient taking differently: Take 325 mg by mouth every other day.)   fesoterodine  (TOVIAZ ) 8 MG TB24 tablet Take 1 tablet (8 mg total) by mouth daily.   fluticasone  (FLONASE ) 50 MCG/ACT nasal spray Place 2 sprays into both nostrils in the morning and at bedtime. (Patient taking differently: Place 2 sprays into both nostrils daily as needed.)   folic acid  (FOLVITE ) 1 MG tablet Take 1 tablet (1 mg total) by  mouth daily.   furosemide  (LASIX ) 20 MG tablet Take 1 tablet (20 mg total) by mouth daily as needed for edema or fluid.   naltrexone (DEPADE) 50 MG tablet Take 25 mg by mouth daily.   omeprazole  (PRILOSEC) 40 MG capsule TAKE 1 CAPSULE(40 MG) BY MOUTH IN THE MORNING AND AT BEDTIME   ondansetron  (ZOFRAN ) 4 MG tablet Take 1 tablet (4 mg total) by mouth every 6 (six) hours as needed for nausea.   OXYGEN Inhale into the lungs.   Semaglutide -Weight Management (WEGOVY ) 1 MG/0.5ML SOAJ Inject 1 mg into the skin once a week.   Tiotropium Bromide-Olodaterol (STIOLTO RESPIMAT ) 2.5-2.5 MCG/ACT AERS Inhale 2 puffs into the lungs daily.   traMADol  (ULTRAM ) 50 MG tablet Take 50 mg by mouth as needed.   triamcinolone  cream (KENALOG ) 0.1 % Apply 1 Application topically 2 (two) times daily as needed (Skin irritation.).   valACYclovir  (VALTREX ) 500 MG tablet TAKE 1 TABLET(500 MG) BY MOUTH TWICE DAILY FOR 3 DAYS AT START OF OUTBREAK   XARELTO  20 MG TABS tablet TAKE 1 TABLET(20 MG) BY MOUTH IN THE MORNING   Zoster Vaccine Adjuvanted (SHINGRIX ) injection Administer Shingrix  vaccination now and repeat in two months (Patient not taking: Reported on 10/29/2023)   No facility-administered encounter medications on file as of 10/29/2023.     REVIEW OF SYSTEMS  : All other systems reviewed and negative except where noted in the History of Present Illness.   PHYSICAL EXAM: BP 124/80 (BP Location: Left Arm, Patient Position: Sitting, Cuff Size: Large)   Pulse 88   Ht 5' 4 (1.626 m) Comment: height measured without shoes  Wt (!) 353 lb 6 oz (160.3 kg)   LMP 10/16/2013 (Approximate)   BMI 60.66 kg/m  General: Well developed AA female in no acute distress Head: Normocephalic and atraumatic Eyes:  Sclerae anicteric, conjunctiva pink. Ears: Normal auditory acuity Lungs: Clear throughout to auscultation; no W/R/R. Heart: Regular rate and rhythm; no M/R/G. Rectal:  Will be done at the time of  colonoscopy. Musculoskeletal: Symmetrical with no gross deformities  Skin: No lesions on visible extremities Extremities: No edema  Neurological: Alert oriented x 4, grossly non-focal Psychological:  Alert and cooperative. Normal mood and affect  ASSESSMENT AND PLAN: *Personal history of colon polyps: Last colonoscopy was April 2022 with a repeat recommended in 3 years. *Hiatal hernia/GERD: Had a 5 cm hiatal hernia noted on last EGD in 2020.  Patient's bariatric surgeon is requesting EGD to reassess hernia.  She is on her journey of weight loss to hopefully undergo bariatric surgery at some point and they are considering hiatal hernia repair at the same time if needed.  She has had several recurrences of aspiration pneumonia that they think is occurring at nighttime from reflux *Chronic anticoagulation with Xarelto  due to history of DVTs/PEs years ago while she was on chemotherapy for uterine/ovarian cancer. *BMI greater than 50 *Home oxygen use at nighttime  -Patient is requesting change in physician care.  I have messaged Dr. Legrand and Dr. Nandigam to see if they will both approve the change.  Will need to schedule EGD and colonoscopy at [facility] with the appropriate physician once I obtain responses.  Will need to get clearance to hold Xarelto . - Continue omeprazole  40 mg daily in the evenings with dinner as she is currently doing.  Will also add famotidine  40 mg right at bedtime with a sip of water.  Prescription sent to pharmacy.   CC:  Donah Laymon PARAS, MD

## 2023-10-30 ENCOUNTER — Telehealth: Payer: Self-pay | Admitting: *Deleted

## 2023-10-30 ENCOUNTER — Other Ambulatory Visit: Payer: Self-pay | Admitting: *Deleted

## 2023-10-30 DIAGNOSIS — K449 Diaphragmatic hernia without obstruction or gangrene: Secondary | ICD-10-CM

## 2023-10-30 DIAGNOSIS — K219 Gastro-esophageal reflux disease without esophagitis: Secondary | ICD-10-CM

## 2023-10-30 DIAGNOSIS — Z860100 Personal history of colon polyps, unspecified: Secondary | ICD-10-CM

## 2023-10-30 MED ORDER — NA SULFATE-K SULFATE-MG SULF 17.5-3.13-1.6 GM/177ML PO SOLN: 1.0000 | Freq: Once | ORAL | 0 refills | Status: AC

## 2023-10-30 NOTE — Progress Notes (Signed)
 ____________________________________________________________  Attending physician addendum:  Thank you for sending this case to me. I have reviewed the entire note and agree with the plan.  If she decides to continue her care with me, my next available hospital outpatient EGD and colonoscopy are in October.  Victory Brand, MD  ____________________________________________________________

## 2023-10-30 NOTE — Telephone Encounter (Signed)
-----   Message from Hazelwood D. Zehr sent at 10/30/2023  9:28 AM EDT ----- Both MDs ok with switch.  Schedule EGD and colonoscopy with Nandigam next available at [facility] hospital.  She is on Xarelto  prescribed by PCP that I believe is in EPIC. ----- Message ----- From: Nandigam, Kavitha V, MD Sent: 10/30/2023   9:06 AM EDT To: Harlene BIRCH Zehr, PA-C  Its fine, I am not sure when my next available it ----- Message ----- From: Legrand Victory LITTIE DOUGLAS, MD Sent: 10/30/2023   2:57 AM EDT To: Harlene BIRCH Mail, PA-C; Kavitha Nandigam V, MD  Yes, if she wants to and Dr. Shila is willing and available.  I put in my note addendum that if she decides to stay with me, my next available Darry[employer] Law cases are in October.  HD ----- Message ----- From: Mail Harlene BIRCH DEVONNA Sent: 10/29/2023   9:53 AM EDT To: Gustav Shila GAILS, MD; Victory LITTIE Legrand III, MD  Patient is due for recall colonoscopy at [facility].  She needs EGD as well for GERD to evaluate hiatal hernia as requested by surgery.  She is a Dr. Legrand patient.  Requesting to switch to Dr. Nandigam.  Would you both be ok with the switch?  Thank you,  Harlene

## 2023-10-30 NOTE — Telephone Encounter (Signed)
 Spoke with patient and scheduled ECL on 12/24/23 at [facility] @ 8:45 am.

## 2023-11-05 ENCOUNTER — Encounter: Payer: Self-pay | Admitting: Oncology

## 2023-11-08 ENCOUNTER — Other Ambulatory Visit: Payer: Self-pay | Admitting: Nurse Practitioner

## 2023-11-11 ENCOUNTER — Encounter: Payer: Self-pay | Admitting: Oncology

## 2023-11-13 ENCOUNTER — Other Ambulatory Visit: Payer: Self-pay | Admitting: Family Medicine

## 2023-11-19 ENCOUNTER — Ambulatory Visit (INDEPENDENT_AMBULATORY_CARE_PROVIDER_SITE_OTHER): Admitting: Family Medicine

## 2023-11-19 ENCOUNTER — Encounter: Payer: Self-pay | Admitting: Family Medicine

## 2023-11-19 DIAGNOSIS — Z6841 Body Mass Index (BMI) 40.0 and over, adult: Secondary | ICD-10-CM | POA: Diagnosis not present

## 2023-11-19 DIAGNOSIS — Z0000 Encounter for general adult medical examination without abnormal findings: Secondary | ICD-10-CM

## 2023-11-19 DIAGNOSIS — Z7901 Long term (current) use of anticoagulants: Secondary | ICD-10-CM

## 2023-11-19 DIAGNOSIS — I10 Essential (primary) hypertension: Secondary | ICD-10-CM

## 2023-11-19 NOTE — Assessment & Plan Note (Signed)
 Essential hypertension with slightly elevated blood pressure today, likely due to not taking antihypertensive medication prior to the visit. She is on amlodipine  and olmesartan .

## 2023-11-19 NOTE — Progress Notes (Signed)
  Date of Visit: 11/19/2023   SUBJECTIVE:   HPI:  Discussed the use of AI scribe software for clinical note transcription with the patient, who gave verbal consent to proceed.  History of Present Illness [name] is a 58 year old female with obesity and recurrent blood clots who presents for a follow-up regarding weight management.  Obesity and weight management - Currently undergoing treatment with Wegovy  for weight management at a dose of 1 mg once weekly - Experiences stomach discomfort and nausea for the first couple of days after Wegovy  administration; symptoms are tolerable and do not interfere with daily activities - Reports a weight loss of seven pounds since the last visit, attributed to current medication regimen and lifestyle changes including dietary adjustments - Actively engaged in physical activity, attending sessions at [employer] three times a week, including pool workouts - Receives guidance from a trainer on equipment use and exercise performance  Anticoagulation and thromboembolic disease - History of recurrent blood clots - Currently on Xarelto  for anticoagulation - Scheduled for colonoscopy on December 24, 2023  Immunization status - Has not received the shingles vaccine due to concern about potential side effects after observing her husband's reaction - Believes she may have been vaccinated against hepatitis B; confirmation pending future blood work    OBJECTIVE:   BP (!) 140/85   Pulse 81   Ht 5' 4 (1.626 m)   Wt (!) 356 lb 9.6 oz (161.8 kg)   LMP 10/16/2013 (Approximate)   SpO2 98%   BMI 61.21 kg/m  Gen: no acute distress, pleasant, cooperative HEENT: normocephalic, atraumatic  Heart: regular rate and rhythm, no murmur Lungs: clear to auscultation bilaterally, normal work of breathing  Neuro: alert speech normal, grossly nonfocal    ASSESSMENT/PLAN:   Assessment & Plan Morbid obesity (HCC) Morbid obesity with ongoing weight  loss. Currently on Wegovy  1 mg weekly with tolerable side effects including mild nausea and abdominal discomfort. She is losing approximately 1 pound per week. Prefers to avoid bariatric surgery at this time and aims to reach 300 pounds before considering surgical options. She is comfortable with the current medication and has declined a switch to Zepbound as suggested by her bariatric provider. - Increase Wegovy  to 1.7 mg weekly - Monitor for increased gastrointestinal side effects - Follow up in 6 weeks to assess response to increased Wegovy  dose Essential hypertension, benign Essential hypertension with slightly elevated blood pressure today, likely due to not taking antihypertensive medication prior to the visit. She is on amlodipine  and olmesartan . Routine adult health maintenance - Reminded of shingrix , she will get - Plan to check Hep B surface antibody with next lab draw Chronic anticoagulation Recurrent venous thromboembolism managed with Xarelto  20 mg daily. Discussed the need to hold Xarelto  prior to upcoming colonoscopy scheduled for September 18th, with associated risk of clot formation during the period off anticoagulation. She understands and accepts the risk, acknowledging the necessity to stop Xarelto  to prevent bleeding during the procedure. - Hold Xarelto  prior to colonoscopy as per gastroenterologist's instructions     FOLLOW UP: Follow up in 6 weeks for above issues  [place] J. Donah, MD [facility]

## 2023-11-19 NOTE — Patient Instructions (Signed)
 It was great to see you again today.  VISIT SUMMARY:  Today, we reviewed your progress with weight management and discussed your current medications. You have been successfully losing weight with Wegovy  and are actively participating in physical activities. We also addressed your concerns about the potential side effects of Wegovy  on your kidney function and discussed your upcoming colonoscopy and the management of your blood clot condition.  YOUR PLAN:  -MORBID OBESITY: Morbid obesity is a condition where a person has an excessively high body weight. You are currently managing this with Wegovy , which has helped you lose weight. We will increase your Wegovy  dose to 1.7 mg weekly and monitor for any increased side effects. Please follow up in 6 weeks to assess your response to the new dose.  -ESSENTIAL HYPERTENSION: Essential hypertension is high blood pressure with no identifiable cause. Your blood pressure was slightly elevated today, likely because you did not take your medication before the visit. Continue taking amlodipine  and olmesartan  as prescribed.  -RECURRENT VENOUS THROMBOEMBOLISM: Recurrent venous thromboembolism is the repeated formation of blood clots in the veins. You are managing this with Xarelto . You will need to stop taking Xarelto  before your colonoscopy to prevent bleeding during the procedure, as per your gastroenterologist's instructions.  INSTRUCTIONS:  Please follow up in 6 weeks to assess your response to the increased Wegovy  dose. Hold Xarelto  prior to your colonoscopy on September 18th as per your gastroenterologist's instructions.  Be well, Dr. Donah

## 2023-11-19 NOTE — Assessment & Plan Note (Signed)
-   Reminded of shingrix , she will get - Plan to check Hep B surface antibody with next lab draw

## 2023-11-19 NOTE — Assessment & Plan Note (Signed)
 Morbid obesity with ongoing weight loss. Currently on Wegovy  1 mg weekly with tolerable side effects including mild nausea and abdominal discomfort. She is losing approximately 1 pound per week. Prefers to avoid bariatric surgery at this time and aims to reach 300 pounds before considering surgical options. She is comfortable with the current medication and has declined a switch to Zepbound as suggested by her bariatric provider. - Increase Wegovy  to 1.7 mg weekly - Monitor for increased gastrointestinal side effects - Follow up in 6 weeks to assess response to increased Wegovy  dose

## 2023-11-19 NOTE — Assessment & Plan Note (Signed)
 Recurrent venous thromboembolism managed with Xarelto  20 mg daily. Discussed the need to hold Xarelto  prior to upcoming colonoscopy scheduled for September 18th, with associated risk of clot formation during the period off anticoagulation. She understands and accepts the risk, acknowledging the necessity to stop Xarelto  to prevent bleeding during the procedure. - Hold Xarelto  prior to colonoscopy as per gastroenterologist's instructions

## 2023-11-22 ENCOUNTER — Other Ambulatory Visit: Payer: Self-pay | Admitting: Family Medicine

## 2023-11-25 ENCOUNTER — Other Ambulatory Visit: Payer: Self-pay

## 2023-11-25 ENCOUNTER — Observation Stay (HOSPITAL_BASED_OUTPATIENT_CLINIC_OR_DEPARTMENT_OTHER)
Admission: EM | Admit: 2023-11-25 | Discharge: 2023-11-27 | Disposition: A | Discharge status: Home / Self Care | Attending: General Surgery | Admitting: General Surgery

## 2023-11-25 ENCOUNTER — Emergency Department (HOSPITAL_BASED_OUTPATIENT_CLINIC_OR_DEPARTMENT_OTHER)

## 2023-11-25 ENCOUNTER — Ambulatory Visit

## 2023-11-25 ENCOUNTER — Encounter (HOSPITAL_BASED_OUTPATIENT_CLINIC_OR_DEPARTMENT_OTHER): Payer: Self-pay | Admitting: Emergency Medicine

## 2023-11-25 DIAGNOSIS — R748 Abnormal levels of other serum enzymes: Principal | ICD-10-CM

## 2023-11-25 DIAGNOSIS — Z79899 Other long term (current) drug therapy: Secondary | ICD-10-CM | POA: Diagnosis not present

## 2023-11-25 DIAGNOSIS — K8010 Calculus of gallbladder with chronic cholecystitis without obstruction: Principal | ICD-10-CM | POA: Insufficient documentation

## 2023-11-25 DIAGNOSIS — I10 Essential (primary) hypertension: Secondary | ICD-10-CM | POA: Insufficient documentation

## 2023-11-25 DIAGNOSIS — R1013 Epigastric pain: Secondary | ICD-10-CM | POA: Diagnosis not present

## 2023-11-25 DIAGNOSIS — K8050 Calculus of bile duct without cholangitis or cholecystitis without obstruction: Secondary | ICD-10-CM | POA: Diagnosis not present

## 2023-11-25 DIAGNOSIS — Z880 Allergy status to penicillin: Secondary | ICD-10-CM | POA: Diagnosis not present

## 2023-11-25 DIAGNOSIS — K449 Diaphragmatic hernia without obstruction or gangrene: Secondary | ICD-10-CM | POA: Diagnosis not present

## 2023-11-25 DIAGNOSIS — K8020 Calculus of gallbladder without cholecystitis without obstruction: Secondary | ICD-10-CM | POA: Diagnosis not present

## 2023-11-25 DIAGNOSIS — N200 Calculus of kidney: Secondary | ICD-10-CM | POA: Diagnosis not present

## 2023-11-25 DIAGNOSIS — K831 Obstruction of bile duct: Secondary | ICD-10-CM

## 2023-11-25 DIAGNOSIS — R1011 Right upper quadrant pain: Secondary | ICD-10-CM | POA: Diagnosis not present

## 2023-11-25 DIAGNOSIS — K838 Other specified diseases of biliary tract: Secondary | ICD-10-CM | POA: Diagnosis not present

## 2023-11-25 LAB — COMPREHENSIVE METABOLIC PANEL WITH GFR
ALT: 113 U/L — ABNORMAL HIGH (ref 0–44)
AST: 247 U/L — ABNORMAL HIGH (ref 15–41)
Albumin: 3.9 g/dL (ref 3.5–5.0)
Alkaline Phosphatase: 157 U/L — ABNORMAL HIGH (ref 38–126)
Anion gap: 13 (ref 5–15)
BUN: 17 mg/dL (ref 6–20)
CO2: 22 mmol/L (ref 22–32)
Calcium: 9.9 mg/dL (ref 8.9–10.3)
Chloride: 107 mmol/L (ref 98–111)
Creatinine, Ser: 1.21 mg/dL — ABNORMAL HIGH (ref 0.44–1.00)
GFR, Estimated: 52 mL/min — ABNORMAL LOW (ref >60)
Glucose, Bld: 139 mg/dL — ABNORMAL HIGH (ref 70–99)
Potassium: 4 mmol/L (ref 3.5–5.1)
Sodium: 142 mmol/L (ref 135–145)
Total Bilirubin: 1.3 mg/dL — ABNORMAL HIGH (ref 0.0–1.2)
Total Protein: 6.8 g/dL (ref 6.5–8.1)

## 2023-11-25 LAB — URINALYSIS, ROUTINE W REFLEX MICROSCOPIC
Bilirubin Urine: NEGATIVE
Glucose, UA: NEGATIVE mg/dL
Hgb urine dipstick: NEGATIVE
Ketones, ur: NEGATIVE mg/dL
Leukocytes,Ua: NEGATIVE
Nitrite: NEGATIVE
Protein, ur: NEGATIVE mg/dL
Specific Gravity, Urine: 1.04 — ABNORMAL HIGH (ref 1.005–1.030)
pH: 5.5 (ref 5.0–8.0)

## 2023-11-25 LAB — CBC WITH DIFFERENTIAL/PLATELET
Abs Immature Granulocytes: 0.02 K/uL (ref 0.00–0.07)
Basophils Absolute: 0 K/uL (ref 0.0–0.1)
Basophils Relative: 1 %
Eosinophils Absolute: 0.1 K/uL (ref 0.0–0.5)
Eosinophils Relative: 1 %
HCT: 39.2 % (ref 36.0–46.0)
Hemoglobin: 12.6 g/dL (ref 12.0–15.0)
Immature Granulocytes: 0 %
Lymphocytes Relative: 15 %
Lymphs Abs: 1.1 K/uL (ref 0.7–4.0)
MCH: 28.8 pg (ref 26.0–34.0)
MCHC: 32.1 g/dL (ref 30.0–36.0)
MCV: 89.5 fL (ref 80.0–100.0)
Monocytes Absolute: 0.5 K/uL (ref 0.1–1.0)
Monocytes Relative: 7 %
Neutro Abs: 5.5 K/uL (ref 1.7–7.7)
Neutrophils Relative %: 76 %
Platelets: 197 K/uL (ref 150–400)
RBC: 4.38 MIL/uL (ref 3.87–5.11)
RDW: 14.3 % (ref 11.5–15.5)
WBC: 7.2 K/uL (ref 4.0–10.5)
nRBC: 0 % (ref 0.0–0.2)

## 2023-11-25 LAB — LIPASE, BLOOD: Lipase: 32 U/L (ref 11–51)

## 2023-11-25 MED ORDER — ONDANSETRON HCL 4 MG PO TABS
4.0000 mg | ORAL_TABLET | Freq: Four times a day (QID) | ORAL | Status: DC | PRN
Start: 2023-11-25 — End: 2023-11-27

## 2023-11-25 MED ORDER — MORPHINE SULFATE (PF) 2 MG/ML IV SOLN
2.0000 mg | INTRAVENOUS | Status: DC | PRN
  Administered 2023-11-26: 2 mg via INTRAVENOUS
  Filled 2023-11-25: qty 1

## 2023-11-25 MED ORDER — HYOSCYAMINE SULFATE 0.125 MG SL SUBL
0.1250 mg | SUBLINGUAL_TABLET | Freq: Once | SUBLINGUAL | Status: AC
  Administered 2023-11-25: 0.125 mg via SUBLINGUAL
  Filled 2023-11-25: qty 1

## 2023-11-25 MED ORDER — ONDANSETRON HCL 4 MG/2ML IJ SOLN
4.0000 mg | Freq: Once | INTRAMUSCULAR | Status: AC
  Administered 2023-11-25: 4 mg via INTRAVENOUS
  Filled 2023-11-25: qty 2

## 2023-11-25 MED ORDER — UMECLIDINIUM BROMIDE 62.5 MCG/ACT IN AEPB
1.0000 | INHALATION_SPRAY | Freq: Every day | RESPIRATORY_TRACT | Status: DC
  Administered 2023-11-26 – 2023-11-27 (×2): 1 via RESPIRATORY_TRACT
  Filled 2023-11-25: qty 7

## 2023-11-25 MED ORDER — IOHEXOL 300 MG/ML  SOLN
100.0000 mL | Freq: Once | INTRAMUSCULAR | Status: AC | PRN
  Administered 2023-11-25: 100 mL via INTRAVENOUS

## 2023-11-25 MED ORDER — MORPHINE SULFATE (PF) 4 MG/ML IV SOLN
4.0000 mg | Freq: Once | INTRAVENOUS | Status: AC
  Administered 2023-11-25: 4 mg via INTRAVENOUS
  Filled 2023-11-25: qty 1

## 2023-11-25 MED ORDER — OXYCODONE HCL 5 MG PO TABS
5.0000 mg | ORAL_TABLET | ORAL | Status: DC | PRN
  Administered 2023-11-26 – 2023-11-27 (×3): 5 mg via ORAL
  Filled 2023-11-25 (×3): qty 1

## 2023-11-25 MED ORDER — TRAZODONE HCL 50 MG PO TABS: 25.0000 mg | ORAL_TABLET | Freq: Every evening | ORAL | Status: DC | PRN

## 2023-11-25 MED ORDER — LORAZEPAM 1 MG PO TABS
1.0000 mg | ORAL_TABLET | Freq: Once | ORAL | Status: AC
  Administered 2023-11-26: 1 mg via ORAL
  Filled 2023-11-25 (×2): qty 1

## 2023-11-25 MED ORDER — AMLODIPINE BESYLATE 5 MG PO TABS
5.0000 mg | ORAL_TABLET | Freq: Every day | ORAL | Status: DC
  Administered 2023-11-25 – 2023-11-26 (×2): 5 mg via ORAL
  Filled 2023-11-25 (×2): qty 1

## 2023-11-25 MED ORDER — ALBUTEROL SULFATE (2.5 MG/3ML) 0.083% IN NEBU: 2.5000 mg | INHALATION_SOLUTION | RESPIRATORY_TRACT | Status: DC | PRN

## 2023-11-25 MED ORDER — ARFORMOTEROL TARTRATE 15 MCG/2ML IN NEBU
15.0000 ug | INHALATION_SOLUTION | Freq: Two times a day (BID) | RESPIRATORY_TRACT | Status: DC
  Administered 2023-11-25 – 2023-11-27 (×4): 15 ug via RESPIRATORY_TRACT
  Filled 2023-11-25 (×4): qty 2

## 2023-11-25 MED ORDER — IBUPROFEN 400 MG PO TABS: 400.0000 mg | ORAL_TABLET | Freq: Four times a day (QID) | ORAL | Status: DC | PRN

## 2023-11-25 MED ORDER — SODIUM CHLORIDE 0.9 % IV BOLUS
1000.0000 mL | Freq: Once | INTRAVENOUS | Status: AC
  Administered 2023-11-25: 1000 mL via INTRAVENOUS

## 2023-11-25 MED ORDER — HEPARIN (PORCINE) 25000 UT/250ML-% IV SOLN
1300.0000 [IU]/h | INTRAVENOUS | Status: DC
  Administered 2023-11-25: 1500 [IU]/h via INTRAVENOUS
  Filled 2023-11-25: qty 250

## 2023-11-25 MED ORDER — PANTOPRAZOLE SODIUM 40 MG PO TBEC
40.0000 mg | DELAYED_RELEASE_TABLET | Freq: Every day | ORAL | Status: DC
  Administered 2023-11-25 – 2023-11-27 (×3): 40 mg via ORAL
  Filled 2023-11-25 (×3): qty 1

## 2023-11-25 MED ORDER — ONDANSETRON HCL 4 MG/2ML IJ SOLN: 4.0000 mg | Freq: Four times a day (QID) | INTRAMUSCULAR | Status: DC | PRN

## 2023-11-25 NOTE — ED Notes (Signed)
 Pt aware of the need for a urine... Pt currently unable to provide a sample.SABRASABRASABRA

## 2023-11-25 NOTE — ED Notes (Signed)
 Report give to the Floor RN.SABRASABRA

## 2023-11-25 NOTE — Progress Notes (Signed)
 PHARMACY - ANTICOAGULATION CONSULT NOTE  Pharmacy Consult for Heparin  Indication: Hx PE, holding Xarelto   Allergies  Allergen Reactions   Tape Itching    Surgical tape   Penicillins Rash    Has patient had a PCN reaction causing immediate rash, facial/tongue/throat swelling, SOB or lightheadedness with hypotension: no Has patient had a PCN reaction causing severe rash involving mucus membranes or skin necrosis: no Has patient had a PCN reaction that required hospitalization in the hospital at the time Has patient had a PCN reaction occurring within the last 10 years: no If all of the above answers are NO, then may proceed with Cephalosporin use.     Patient Measurements:   Last documented weight 162 kg Heparin  dosing weight 96 kg  Vital Signs: Temp: 97.5 F (36.4 C) (08/20 1743) Temp Source: Oral (08/20 1441) BP: 147/80 (08/20 1743) Pulse Rate: 71 (08/20 1743)  Labs: Recent Labs    11/25/23 0923  HGB 12.6  HCT 39.2  PLT 197  CREATININE 1.21*    Estimated Creatinine Clearance: 78 mL/min (A) (by C-G formula based on SCr of 1.21 mg/dL (H)).   Medical History: Past Medical History:  Diagnosis Date   Allergy    Anemia    Anxiety    Arthritis    Aspiration pneumonia (HCC)    Blood transfusion without reported diagnosis    Cough    Cough 06/11/2018   Difficulty sleeping 06/11/2018   Diverticulitis with perforation 2010   DVT (deep vein thrombosis) in pregnancy 11/24/2016   DVT, lower extremity (HCC)    Endometrial cancer (HCC)    Enlarged lymph nodes 08/13/2017   Family history of adverse reaction to anesthesia    pts daughter had difficulty awakening following anesthesia, long time to wake up   GERD (gastroesophageal reflux disease)    Headache    Hemoptysis 04/22/2018   History of bronchitis    History of ear infections    Hospital discharge follow-up 03/02/2019   Hx of migraines    Hyperlipidemia    Hypertension    IBS (irritable bowel syndrome)     Kidney infection    Lupus (systemic lupus erythematosus) (HCC) 02/2017   Morbid obesity (HCC)    Neck pain 01/13/2019   Ovarian cancer (HCC) dx'd 01/2015   PONV (postoperative nausea and vomiting)    Port-A-Cath in place    Right side   Portacath in place 09/12/2015   Pre-diabetes    Pyelonephritis    Right knee pain 11/28/2011   Scleroderma (HCC)    Screen for colon cancer 03/02/2019   Slow rate of speech 08/22/2015   Chronic from CVA.  She also has loss of left nasolabial fold and slight left facial droop/small asymmetry on the left side secondary to CVA   UTI (urinary tract infection) 05/07/2018    Medications:  Scheduled:   amLODipine   5 mg Oral Daily   arformoterol   15 mcg Nebulization BID   And   umeclidinium bromide   1 puff Inhalation Daily   LORazepam   1 mg Oral Once   pantoprazole   40 mg Oral Daily   Infusions:   Assessment: 2 yoF presents to ED with abdominal pain, vomiting, found to have evidence of choledocholithiasis.  PMH significnat for SLE, hx PE on Xarelto .  Pharmacy is consulted to dose heparin  while home Xarelto  is held.  Patient reports taking her last dose of Xarelto  8/19 10:30am.   SCr 1.21 CBC: Hgb and Plt WNL   Goal of Therapy:  Heparin  level 0.3-0.7 units/ml aPTT 66-102 seconds Monitor platelets by anticoagulation protocol: Yes   Plan:  Start heparin  IV infusion at 1500 units/hr  aPTT 6 hours after starting Daily aPTT, heparin  level, and CBC   Wanda Hasting PharmD, BCPS [facility] main pharmacy [phone] 11/25/2023 7:16 PM

## 2023-11-25 NOTE — ED Notes (Signed)
 Called [facility] to transport the patient to [facility] rm# [id number]

## 2023-11-25 NOTE — ED Provider Notes (Signed)
 [place] EMERGENCY DEPARTMENT AT [facility] Provider Note   CSN: [id number] Arrival date & time: 11/25/23  9145     Patient presents with: Abdominal Pain   [name] is a 58 y.o. female.   Patient is a 58 year old female who presents to the emergency department the chief complaint of epigastric abdominal pain which has been ongoing since early this morning.  She notes that she is currently on Wegovy  but denies any recent increase in her medication.  She notes that she has had associated nausea, vomiting and multiple bowel movements.  She denies any associated chest pain, shortness of breath.  She denies any dizziness, lightheadedness or syncope.  She does admit to a history of bowel obstruction.   Abdominal Pain      Prior to Admission medications   Medication Sig Start Date End Date Taking? Authorizing Provider  acetaminophen  (TYLENOL ) 500 MG tablet Take 2 tablets (1,000 mg total) by mouth every 6 (six) hours as needed for moderate pain. 03/14/21   Rosendo Rush, MD  albuterol  (PROVENTIL ) (2.5 MG/3ML) 0.083% nebulizer solution Take 3 mLs (2.5 mg total) by nebulization every 6 (six) hours as needed for wheezing or shortness of breath. 08/21/22   Alba Sharper, MD  albuterol  (VENTOLIN  HFA) 108 (90 Base) MCG/ACT inhaler INHALE 2 PUFFS INTO THE LUNGS EVERY 6 HOURS AS NEEDED FOR WHEEZING OR SHORTNESS OF BREATH 04/02/23   Rumball, Alison M, DO  amLODipine -olmesartan  (AZOR ) 5-20 MG tablet Take 1 tablet by mouth daily. 08/24/23   Acharya, Gayatri A, MD  baclofen  (LIORESAL ) 10 MG tablet Take 1 tablet (10 mg total) by mouth 2 (two) times daily as needed for muscle spasms. 07/15/23   Donah Laymon PARAS, MD  cetirizine  (ZYRTEC ) 10 MG tablet Take 10 mg by mouth as needed.    [provider]  Cholecalciferol (VITAMIN D) 50 MCG (2000 UT) CAPS Take 2,000 Units by mouth daily.    [provider]  diclofenac  Sodium (VOLTAREN ) 1 % GEL Apply to knee once daily as  needed 05/26/22   Ganta, Anupa, DO  dicyclomine  (BENTYL ) 10 MG capsule Take 10 mg by mouth as needed. 09/11/20   [provider]  ezetimibe  (ZETIA ) 10 MG tablet Take 1 tablet (10 mg total) by mouth daily. 02/24/23   Duke, Jon Garre, PA  famotidine  (PEPCID ) 40 MG tablet Take 1 tablet (40 mg total) by mouth at bedtime. 10/29/23   Zehr, Jessica D, PA-C  ferrous sulfate  325 (65 FE) MG EC tablet Take 1 tablet (325 mg total) by mouth daily with breakfast. Patient taking differently: Take 325 mg by mouth every other day. 01/19/23   Kennedy-Smith, Colleen M, NP  fesoterodine  (TOVIAZ ) 8 MG TB24 tablet Take 1 tablet (8 mg total) by mouth daily. 11/12/22   Donah Laymon PARAS, MD  fluticasone  (FLONASE ) 50 MCG/ACT nasal spray Place 2 sprays into both nostrils in the morning and at bedtime. 07/22/22   Theophilus Pagan, MD  folic acid  (FOLVITE ) 1 MG tablet Take 1 tablet (1 mg total) by mouth daily. 02/16/23   Donzetta Rollene FORBES, MD  furosemide  (LASIX ) 20 MG tablet Take 1 tablet (20 mg total) by mouth daily as needed for edema or fluid. 09/17/22   Austin Ade, MD  omeprazole  (PRILOSEC) 40 MG capsule TAKE 1 CAPSULE(40 MG) BY MOUTH IN THE MORNING AND AT BEDTIME 11/08/23   Kennedy-Smith, Colleen M, NP  ondansetron  (ZOFRAN ) 4 MG tablet Take 1 tablet (4 mg total) by mouth every 6 (six) hours as  needed for nausea. 06/27/22   Will Almarie MATSU, MD  OXYGEN Inhale into the lungs.    [provider]  Semaglutide -Weight Management (WEGOVY ) 1 MG/0.5ML SOAJ Inject 1 mg into the skin once a week. 10/23/23   Donah Laymon PARAS, MD  Tiotropium Bromide-Olodaterol (STIOLTO RESPIMAT ) 2.5-2.5 MCG/ACT AERS Inhale 2 puffs into the lungs daily. 04/29/23   Hunsucker, Donnice SAUNDERS, MD  traMADol  (ULTRAM ) 50 MG tablet Take 50 mg by mouth as needed.    [provider]  triamcinolone  cream (KENALOG ) 0.1 % Apply 1 Application topically 2 (two) times daily as needed (Skin irritation.). 08/06/23   Donah Laymon PARAS, MD   valACYclovir  (VALTREX ) 500 MG tablet TAKE 1 TABLET(500 MG) BY MOUTH TWICE DAILY FOR 3 DAYS. START OF OUTBREAK 11/24/23   Donah Laymon PARAS, MD  XARELTO  20 MG TABS tablet TAKE 1 TABLET(20 MG) BY MOUTH IN THE MORNING 11/13/23   Donah Laymon PARAS, MD  Zoster Vaccine Adjuvanted (SHINGRIX ) injection Administer Shingrix  vaccination now and repeat in two months Patient not taking: Reported on 10/29/2023 05/21/23   Donah Laymon PARAS, MD    Allergies: Tape and Penicillins    Review of Systems  Gastrointestinal:  Positive for abdominal pain.  All other systems reviewed and are negative.   Updated Vital Signs BP (!) 144/75 (BP Location: Right Arm)   Pulse 70   Temp (!) 97.5 F (36.4 C) (Oral)   Resp 18   LMP 10/16/2013 (Approximate)   SpO2 99%   Physical Exam Vitals and nursing note reviewed.  Constitutional:      Appearance: Normal appearance.  HENT:     Head: Normocephalic and atraumatic.     Nose: Nose normal.     Mouth/Throat:     Mouth: Mucous membranes are moist.  Eyes:     Extraocular Movements: Extraocular movements intact.     Conjunctiva/sclera: Conjunctivae normal.     Pupils: Pupils are equal, round, and reactive to light.  Cardiovascular:     Rate and Rhythm: Normal rate and regular rhythm.     Pulses: Normal pulses.     Heart sounds: Normal heart sounds. No murmur heard.    No gallop.  Pulmonary:     Effort: Pulmonary effort is normal. No respiratory distress.     Breath sounds: Normal breath sounds. No stridor. No wheezing, rhonchi or rales.  Abdominal:     General: Abdomen is flat. Bowel sounds are normal. There is no distension. There are no signs of injury.     Palpations: Abdomen is soft.     Tenderness: There is abdominal tenderness in the epigastric area. Negative signs include Murphy's sign and McBurney's sign.     Hernia: No hernia is present.  Musculoskeletal:        General: Normal range of motion.     Cervical back: Normal range of motion and  neck supple.  Skin:    General: Skin is warm and dry.  Neurological:     General: No focal deficit present.     Mental Status: She is alert and oriented to person, place, and time. Mental status is at baseline.  Psychiatric:        Mood and Affect: Mood normal.        Behavior: Behavior normal.        Thought Content: Thought content normal.        Judgment: Judgment normal.     (all labs ordered are listed, but only abnormal results are displayed) Labs Reviewed  COMPREHENSIVE METABOLIC PANEL WITH GFR  CBC WITH DIFFERENTIAL/PLATELET  LIPASE, BLOOD  URINALYSIS, ROUTINE W REFLEX MICROSCOPIC    EKG: None  Radiology: No results found.   Procedures   Medications Ordered in the ED  morphine  (PF) 4 MG/ML injection 4 mg (4 mg Intravenous Given 11/25/23 0928)  ondansetron  (ZOFRAN ) injection 4 mg (4 mg Intravenous Given 11/25/23 0927)  sodium chloride  0.9 % bolus 1,000 mL (1,000 mLs Intravenous New Bag/Given 11/25/23 0926)  hyoscyamine  (LEVSIN  SL) SL tablet 0.125 mg (0.125 mg Sublingual Given 11/25/23 9078)                                    Medical Decision Making Amount and/or Complexity of Data Reviewed Labs: ordered. Radiology: ordered.  Risk Prescription drug management. Decision regarding hospitalization.   This patient presents to the ED for concern of abdominal pain, nausea, vomiting, this involves an extensive number of treatment options, and is a complaint that carries with it a high risk of complications and morbidity.  The differential diagnosis includes acute appendicitis, cholecystitis, small bowel obstruction, diverticulitis, ovarian torsion or cyst, PID, tumor and abscess, pyonephritis, kidney stone, pancreatitis, mesenteric ischemia, choledocholithiasis, cholangitis   Co morbidities that complicate the patient evaluation  Diabetes, chronic kidney disease   Additional history obtained:  Additional history obtained from none External records from outside  source obtained and reviewed including medical records   Lab Tests:  I Ordered, and personally interpreted labs.  The pertinent results include: No leukocytosis, no anemia, mild elevation in creatinine, elevated liver enzymes, normal electrolytes, unremarkable urinalysis, normal lipase   Imaging Studies ordered:  I ordered imaging studies including CT scan of abdomen pelvis, right upper quadrant ultrasound I independently visualized and interpreted imaging which showed intrahepatic biliary ductal dilation I agree with the radiologist interpretation    Consultations Obtained:  I requested consultation with the hospitalist,  and discussed lab and imaging findings as well as pertinent plan - they recommend: Admission   Problem List / ED Course / Critical interventions / Medication management  Patient is doing well at this time and does remain stable.  Pain is well-controlled at this time.  Did discuss patient case with gastroenterology who did recommend admission to [facility], MRCP and they will see the patient in consult.  Patient does have right upper quadrant ultrasound concerning for biliary obstruction.  She does have elevated liver enzymes as well.  Vital signs are stable with no indication for sepsis and she has no associated leukocytosis.  Low suspicion for cholangitis at this point.  Did discuss patient case with Dr. Shea with the hospitalist service who has excepted for admission. I ordered medication including Levsin , Zofran , morphine , IV fluids for abdominal pain Reevaluation of the patient after these medicines showed that the patient improved I have reviewed the patients home medicines and have made adjustments as needed   Social Determinants of Health:  None   Test / Admission - Considered:  Admission     Final diagnoses:  None    ED Discharge Orders     None          Daralene Lonni JONETTA DEVONNA 11/25/23 1624    Ula Prentice SAUNDERS, MD 11/26/23  [phone]

## 2023-11-25 NOTE — ED Notes (Signed)
 This is a Adult nurse patient.  I paged the PA Jessica Zehr

## 2023-11-25 NOTE — H&P (Addendum)
 History and Physical  [name] FMW:[id number] DOB: Nov 03, 1965 DOA: 11/25/2023  PCP: Donah Laymon PARAS, MD   Chief Complaint: Abdominal pain, vomiting  HPI: [name] is a 58 y.o. female with medical history significant for obesity, SLE, multiple blood clots on Xarelto  being admitted to the hospital with sudden onset of abdominal pain found to have evidence of choledocholithiasis.  Patient had sudden onset last night of back pain, which eventually became burning epigastric discomfort which was unrelenting.  There was associated nausea and vomiting.  Denies any fevers, chest pain, diarrhea, or hemoptysis.  She has been compliant with her medications including Xarelto .  Workup in the emergency department with evidence of CBD stone, ER provider discussed with [facility], who recommended hospitalist admission, and MRCP.  Review of Systems: Please see HPI for pertinent positives and negatives. A complete 10 system review of systems are otherwise negative.  Past Medical History:  Diagnosis Date   Allergy    Anemia    Anxiety    Arthritis    Aspiration pneumonia (HCC)    Blood transfusion without reported diagnosis    Cough    Cough 06/11/2018   Difficulty sleeping 06/11/2018   Diverticulitis with perforation 2010   DVT (deep vein thrombosis) in pregnancy 11/24/2016   DVT, lower extremity (HCC)    Endometrial cancer (HCC)    Enlarged lymph nodes 08/13/2017   Family history of adverse reaction to anesthesia    pts daughter had difficulty awakening following anesthesia, long time to wake up   GERD (gastroesophageal reflux disease)    Headache    Hemoptysis 04/22/2018   History of bronchitis    History of ear infections    Hospital discharge follow-up 03/02/2019   Hx of migraines    Hyperlipidemia    Hypertension    IBS (irritable bowel syndrome)    Kidney infection    Lupus (systemic lupus erythematosus) (HCC) 02/2017   Morbid obesity (HCC)    Neck pain  01/13/2019   Ovarian cancer (HCC) dx'd 01/2015   PONV (postoperative nausea and vomiting)    Port-A-Cath in place    Right side   Portacath in place 09/12/2015   Pre-diabetes    Pyelonephritis    Right knee pain 11/28/2011   Scleroderma (HCC)    Screen for colon cancer 03/02/2019   Slow rate of speech 08/22/2015   Chronic from CVA.  She also has loss of left nasolabial fold and slight left facial droop/small asymmetry on the left side secondary to CVA   UTI (urinary tract infection) 05/07/2018   Past Surgical History:  Procedure Laterality Date   ABDOMINAL HYSTERECTOMY N/A 03/13/2015   Procedure: TOTAL HYSTERECTOMY ABDOMINAL BILATERAL SALPINGO OOPHORECTOMY RADICAL TUMOR DEBUKING;  Surgeon: Maurilio Ship, MD;  Location: [facility];  Service: Gynecology;  Laterality: N/A;   BOWEL RESECTION  03/13/2015   Procedure: SMALL BOWEL RESECTION;  Surgeon: Maurilio Ship, MD;  Location: [facility];  Service: Gynecology;;   CESAREAN SECTION  1983 and 1984   COLONOSCOPY WITH PROPOFOL  N/A 07/19/2020   Procedure: COLONOSCOPY WITH PROPOFOL ;  Surgeon: Legrand Victory LITTIE DOUGLAS, MD;  Location: [facility];  Service: Gastroenterology;  Laterality: N/A;   COLOSTOMY  04/07/2006   COLOSTOMY TAKEDOWN     ESOPHAGOGASTRODUODENOSCOPY (EGD) WITH PROPOFOL  N/A 12/21/2018   Procedure: ESOPHAGOGASTRODUODENOSCOPY (EGD) WITH PROPOFOL ;  Surgeon: Legrand Victory LITTIE DOUGLAS, MD;  Location: [facility];  Service: Gastroenterology;  Laterality: N/A;   HEMOSTASIS CLIP PLACEMENT  07/19/2020   Procedure: HEMOSTASIS CLIP PLACEMENT;  Surgeon: Legrand Victory LITTIE DOUGLAS, MD;  Location: [facility];  Service: Gastroenterology;;   IR REMOVAL TUN ACCESS W/ PORT W/O FL MOD SED  12/17/2022   LAPAROTOMY N/A 03/13/2015   Procedure: EXPLORATORY LAPAROTOMY;  Surgeon: Maurilio Ship, MD;  Location: [facility];  Service: Gynecology;  Laterality: N/A;   POLYPECTOMY  07/19/2020   Procedure: POLYPECTOMY;  Surgeon: Legrand Victory LITTIE DOUGLAS, MD;  Location: [facility];  Service:  Gastroenterology;;   TONSILLECTOMY AND ADENOIDECTOMY  04/08/1995   VENTRAL HERNIA REPAIR  03/13/2015   Procedure: HERNIA REPAIR VENTRAL ADULT;  Surgeon: Maurilio Ship, MD;  Location: [facility];  Service: Gynecology;;   Social History:  reports that she has never smoked. She has never used smokeless tobacco. She reports current alcohol use. She reports that she does not use drugs.  Allergies  Allergen Reactions   Tape Itching    Surgical tape   Penicillins Rash    Has patient had a PCN reaction causing immediate rash, facial/tongue/throat swelling, SOB or lightheadedness with hypotension: no Has patient had a PCN reaction causing severe rash involving mucus membranes or skin necrosis: no Has patient had a PCN reaction that required hospitalization in the hospital at the time Has patient had a PCN reaction occurring within the last 10 years: no If all of the above answers are NO, then may proceed with Cephalosporin use.     Family History  Problem Relation Age of Onset   Diabetes Mother    Hypertension Mother    Hyperlipidemia Mother    Colon polyps Mother        4 total   Fibroids Mother        s/p hysterectomy   Hyperlipidemia Father    Lung cancer Father    Kidney failure Maternal Grandmother 36   Pancreatic cancer Paternal Grandmother        dx. >50   Diabetes Paternal Grandfather    Diabetes Daughter    Hodgkin's lymphoma Daughter 59   Asthma Maternal Aunt        severe - d. 24   Prostate cancer Maternal Uncle 65   Diabetes Paternal Aunt    Diabetes Paternal Aunt    Diabetes Paternal Uncle    Heart disease Neg Hx    Stroke Neg Hx    Colon cancer Neg Hx    Stomach cancer Neg Hx    Esophageal cancer Neg Hx    Rectal cancer Neg Hx      Prior to Admission medications   Medication Sig Start Date End Date Taking? Authorizing Provider  acetaminophen  (TYLENOL ) 500 MG tablet Take 2 tablets (1,000 mg total) by mouth every 6 (six) hours as needed for moderate pain. 03/14/21    Rosendo Rush, MD  albuterol  (PROVENTIL ) (2.5 MG/3ML) 0.083% nebulizer solution Take 3 mLs (2.5 mg total) by nebulization every 6 (six) hours as needed for wheezing or shortness of breath. 08/21/22   Alba Sharper, MD  albuterol  (VENTOLIN  HFA) 108 (90 Base) MCG/ACT inhaler INHALE 2 PUFFS INTO THE LUNGS EVERY 6 HOURS AS NEEDED FOR WHEEZING OR SHORTNESS OF BREATH 04/02/23   Rumball, Alison M, DO  amLODipine -olmesartan  (AZOR ) 5-20 MG tablet Take 1 tablet by mouth daily. 08/24/23   Acharya, Gayatri A, MD  baclofen  (LIORESAL ) 10 MG tablet Take 1 tablet (10 mg total) by mouth 2 (two) times daily as needed for muscle spasms. 07/15/23   Donah Laymon PARAS, MD  cetirizine  (ZYRTEC ) 10 MG tablet Take 10 mg by mouth as needed.  [provider]  Cholecalciferol (VITAMIN D) 50 MCG (2000 UT) CAPS Take 2,000 Units by mouth daily.    [provider]  diclofenac  Sodium (VOLTAREN ) 1 % GEL Apply to knee once daily as needed 05/26/22   Ganta, Anupa, DO  dicyclomine  (BENTYL ) 10 MG capsule Take 10 mg by mouth as needed. 09/11/20   [provider]  ezetimibe  (ZETIA ) 10 MG tablet Take 1 tablet (10 mg total) by mouth daily. 02/24/23   Duke, Jon Garre, PA  famotidine  (PEPCID ) 40 MG tablet Take 1 tablet (40 mg total) by mouth at bedtime. 10/29/23   Zehr, Jessica D, PA-C  ferrous sulfate  325 (65 FE) MG EC tablet Take 1 tablet (325 mg total) by mouth daily with breakfast. Patient taking differently: Take 325 mg by mouth every other day. 01/19/23   Kennedy-Smith, Colleen M, NP  fesoterodine  (TOVIAZ ) 8 MG TB24 tablet Take 1 tablet (8 mg total) by mouth daily. 11/12/22   Donah Laymon PARAS, MD  fluticasone  (FLONASE ) 50 MCG/ACT nasal spray Place 2 sprays into both nostrils in the morning and at bedtime. 07/22/22   Theophilus Pagan, MD  folic acid  (FOLVITE ) 1 MG tablet Take 1 tablet (1 mg total) by mouth daily. 02/16/23   Donzetta Rollene BRAVO, MD  furosemide  (LASIX ) 20 MG tablet Take 1 tablet (20 mg total) by  mouth daily as needed for edema or fluid. 09/17/22   Austin Ade, MD  omeprazole  (PRILOSEC) 40 MG capsule TAKE 1 CAPSULE(40 MG) BY MOUTH IN THE MORNING AND AT BEDTIME 11/08/23   Kennedy-Smith, Colleen M, NP  ondansetron  (ZOFRAN ) 4 MG tablet Take 1 tablet (4 mg total) by mouth every 6 (six) hours as needed for nausea. 06/27/22   Will Almarie MATSU, MD  OXYGEN Inhale into the lungs.    [provider]  Semaglutide -Weight Management (WEGOVY ) 1 MG/0.5ML SOAJ Inject 1 mg into the skin once a week. 10/23/23   Donah Laymon PARAS, MD  Tiotropium Bromide-Olodaterol (STIOLTO RESPIMAT ) 2.5-2.5 MCG/ACT AERS Inhale 2 puffs into the lungs daily. 04/29/23   Hunsucker, Donnice SAUNDERS, MD  traMADol  (ULTRAM ) 50 MG tablet Take 50 mg by mouth as needed.    [provider]  triamcinolone  cream (KENALOG ) 0.1 % Apply 1 Application topically 2 (two) times daily as needed (Skin irritation.). 08/06/23   Donah Laymon PARAS, MD  valACYclovir  (VALTREX ) 500 MG tablet TAKE 1 TABLET(500 MG) BY MOUTH TWICE DAILY FOR 3 DAYS. START OF OUTBREAK 11/24/23   Donah Laymon PARAS, MD  XARELTO  20 MG TABS tablet TAKE 1 TABLET(20 MG) BY MOUTH IN THE MORNING 11/13/23   Donah Laymon PARAS, MD  Zoster Vaccine Adjuvanted (SHINGRIX ) injection Administer Shingrix  vaccination now and repeat in two months Patient not taking: Reported on 10/29/2023 05/21/23   Donah Laymon PARAS, MD    Physical Exam: BP (!) 147/80 (BP Location: Left Arm)   Pulse 71   Temp (!) 97.5 F (36.4 C)   Resp 15   LMP 10/16/2013 (Approximate)   SpO2 100%  General:  Alert, oriented, calm, in no acute distress, resting comfortably on room air, her daughter is at the bedside. Eyes: EOMI, clear conjuctivae, white sclerea Neck: supple, no masses, trachea mildline  Cardiovascular: RRR, no murmurs or rubs, no peripheral edema  Respiratory: clear to auscultation bilaterally on anterior exam, no wheezes, no crackles  Abdomen: soft, tender in the epigastrium with  some guarding, nondistended, normal bowel tones heard  Skin: dry, no rashes  Musculoskeletal: no joint effusions, normal range of motion  Psychiatric: appropriate affect, normal speech  Neurologic: extraocular muscles intact, clear speech, moving all extremities with intact sensorium         Labs on Admission:  Basic Metabolic Panel: Recent Labs  Lab 11/25/23 0923  NA 142  K 4.0  CL 107  CO2 22  GLUCOSE 139*  BUN 17  CREATININE 1.21*  CALCIUM  9.9   Liver Function Tests: Recent Labs  Lab 11/25/23 0923  AST 247*  ALT 113*  ALKPHOS 157*  BILITOT 1.3*  PROT 6.8  ALBUMIN 3.9   Recent Labs  Lab 11/25/23 0923  LIPASE 32   No results for input(s): AMMONIA in the last 168 hours. CBC: Recent Labs  Lab 11/25/23 0923  WBC 7.2  NEUTROABS 5.5  HGB 12.6  HCT 39.2  MCV 89.5  PLT 197   Cardiac Enzymes: No results for input(s): CKTOTAL, CKMB, CKMBINDEX, TROPONINI in the last 168 hours. BNP (last 3 results) Recent Labs    12/23/22 1702  BNP 49.7    ProBNP (last 3 results) No results for input(s): PROBNP in the last 8760 hours.  CBG: No results for input(s): GLUCAP in the last 168 hours.  Radiological Exams on Admission: US  Abdomen Limited RUQ (LIVER/GB) Result Date: 11/25/2023 CLINICAL DATA:  [id number] Abdominal pain, acute, right upper quadrant [id number] EXAM: ULTRASOUND ABDOMEN LIMITED RIGHT UPPER QUADRANT COMPARISON:  November 25, 2023, December 14, 2020 FINDINGS: Gallbladder: Multiple small shadowing gallstones. Asymmetric wall thickening along the hepatic aspect of the gallbladder. No sonographic Murphy's sign noted by sonographer. Common bile duct: Diameter: 11 mm Liver: Normal echogenicity. No focal lesion identified. Mild central intrahepatic biliary ductal dilation. Portal vein is patent on color Doppler imaging with normal direction of blood flow towards the liver. Other: None. IMPRESSION: 1. Mild central intrahepatic biliary ductal dilation with  commensurate common bile duct dilation, worrisome for downstream obstruction, possibly from choledocholithiasis. Multiphase abdominal MRI with IV contrast recommended for further characterization. 2. Cholecystolithiasis. Asymmetric wall thickening along the hepatic aspect of the gallbladder, which in the absence of a sonographic Murphy's sign and pericholecystic fluid, may be related to biliary obstruction and stasis or acute hepatitis. Electronically Signed   By: Rogelia Myers M.D.   On: 11/25/2023 11:45   CT ABDOMEN PELVIS W CONTRAST Result Date: 11/25/2023 CLINICAL DATA:  Acute epigastric pain beginning this morning. EXAM: CT ABDOMEN AND PELVIS WITH CONTRAST TECHNIQUE: Multidetector CT imaging of the abdomen and pelvis was performed using the standard protocol following bolus administration of intravenous contrast. RADIATION DOSE REDUCTION: This exam was performed according to the departmental dose-optimization program which includes automated exposure control, adjustment of the mA and/or kV according to patient size and/or use of iterative reconstruction technique. CONTRAST:  OMNIPAQUE  IOHEXOL  300 MG/ML  SOLN COMPARISON:  12/14/2020 FINDINGS: Lower Chest: No acute findings. Hepatobiliary: No suspicious hepatic masses identified. Gallbladder is unremarkable. No evidence of biliary ductal dilatation. Pancreas:  No mass or inflammatory changes. Spleen: Within normal limits in size and appearance. Adrenals/Urinary Tract: Stable mild bilateral renal parenchymal scarring. No suspicious masses identified. Punctate calculus seen in midpole left kidney. No evidence of ureteral calculi or hydronephrosis. Unremarkable unopacified urinary bladder. Stomach/Bowel: Small to moderate hiatal hernia shows no significant change. No evidence of obstruction, inflammatory process or abnormal fluid collections. Vascular/Lymphatic: No pathologically enlarged lymph nodes. No acute vascular findings. Reproductive: Prior  hysterectomy noted. Adnexal regions are unremarkable in appearance. Other:  None. Musculoskeletal:  No suspicious bone lesions identified. IMPRESSION: No acute findings. Stable small to moderate hiatal  hernia. Tiny left renal calculus. No evidence of ureteral calculi or hydronephrosis. Electronically Signed   By: Norleen DELENA Kil M.D.   On: 11/25/2023 10:45   Assessment/Plan [name] is a 58 y.o. female with medical history significant for obesity, SLE, multiple blood clots on Xarelto  being admitted to the hospital with sudden onset of abdominal pain found to have evidence of choledocholithiasis.  Choledocholithiasis-without obvious evidence of acute cholecystitis -Inpatient admission -Clear liquid diet, and n.p.o. after midnight -MRCP, p.o. Ativan  ordered for scan -ER provider discussed with [facility] who will consult  History of multiple DVTs-currently maintained on Xarelto .  Will need to hold anticoagulation in case of ERCP and/or cholecystectomy. -Hold Xarelto  -Pharmacy consult for IV heparin  drip as anticoagulation bridge  Abnormal LFTs-presumably due to cholelithiasis and choledocholithiasis -Avoid hepatotoxins -Trend with daily labs  Hypertension-continue home amlodipine , but hold olmesartan  portion due to mild renal insufficiency  DVT prophylaxis: Heparin  drip as above    Code Status: Full Code  Consults called: [facility]  Admission status: The appropriate patient status for this patient is INPATIENT. Inpatient status is judged to be reasonable and necessary in order to provide the required intensity of service to ensure the patient's safety. The patient's presenting symptoms, physical exam findings, and initial radiographic and laboratory data in the context of their chronic comorbidities is felt to place them at high risk for further clinical deterioration. Furthermore, it is not anticipated that the patient will be medically stable for discharge from the hospital within  2 midnights of admission.    I certify that at the point of admission it is my clinical judgment that the patient will require inpatient hospital care spanning beyond 2 midnights from the point of admission due to high intensity of service, high risk for further deterioration and high frequency of surveillance required  Time spent: 49 minutes  [name] CHRISTELLA Gail MD [facility] Pager [phone]  If 7PM-7AM, please contact night-coverage [email or web address] Password [facility]  11/25/2023, 6:49 PM

## 2023-11-25 NOTE — ED Notes (Signed)
 Report given to [facility].

## 2023-11-25 NOTE — ED Triage Notes (Signed)
 C/o epigastric abd pain starting this morning. States has had flare ups of similar pain since starting Wegovi. +n/v

## 2023-11-26 ENCOUNTER — Inpatient Hospital Stay (HOSPITAL_COMMUNITY): Admitting: Certified Registered Nurse Anesthetist

## 2023-11-26 ENCOUNTER — Inpatient Hospital Stay (HOSPITAL_COMMUNITY)

## 2023-11-26 ENCOUNTER — Inpatient Hospital Stay (HOSPITAL_BASED_OUTPATIENT_CLINIC_OR_DEPARTMENT_OTHER): Admitting: Certified Registered Nurse Anesthetist

## 2023-11-26 ENCOUNTER — Encounter (HOSPITAL_COMMUNITY): Admission: EM | Disposition: A | Discharge status: Home / Self Care | Payer: Self-pay | Source: Home / Self Care

## 2023-11-26 ENCOUNTER — Encounter (HOSPITAL_COMMUNITY): Payer: Self-pay

## 2023-11-26 DIAGNOSIS — K838 Other specified diseases of biliary tract: Secondary | ICD-10-CM | POA: Diagnosis not present

## 2023-11-26 DIAGNOSIS — K8010 Calculus of gallbladder with chronic cholecystitis without obstruction: Secondary | ICD-10-CM | POA: Diagnosis not present

## 2023-11-26 DIAGNOSIS — Z880 Allergy status to penicillin: Secondary | ICD-10-CM | POA: Diagnosis not present

## 2023-11-26 DIAGNOSIS — K8020 Calculus of gallbladder without cholecystitis without obstruction: Secondary | ICD-10-CM | POA: Diagnosis not present

## 2023-11-26 DIAGNOSIS — K8000 Calculus of gallbladder with acute cholecystitis without obstruction: Secondary | ICD-10-CM

## 2023-11-26 DIAGNOSIS — Z79899 Other long term (current) drug therapy: Secondary | ICD-10-CM | POA: Diagnosis not present

## 2023-11-26 DIAGNOSIS — K660 Peritoneal adhesions (postprocedural) (postinfection): Secondary | ICD-10-CM | POA: Diagnosis not present

## 2023-11-26 DIAGNOSIS — I5032 Chronic diastolic (congestive) heart failure: Secondary | ICD-10-CM | POA: Diagnosis not present

## 2023-11-26 DIAGNOSIS — I110 Hypertensive heart disease with heart failure: Secondary | ICD-10-CM | POA: Diagnosis not present

## 2023-11-26 DIAGNOSIS — R7989 Other specified abnormal findings of blood chemistry: Secondary | ICD-10-CM

## 2023-11-26 DIAGNOSIS — K8050 Calculus of bile duct without cholangitis or cholecystitis without obstruction: Secondary | ICD-10-CM | POA: Diagnosis not present

## 2023-11-26 DIAGNOSIS — K449 Diaphragmatic hernia without obstruction or gangrene: Secondary | ICD-10-CM | POA: Diagnosis not present

## 2023-11-26 DIAGNOSIS — R1084 Generalized abdominal pain: Secondary | ICD-10-CM | POA: Diagnosis not present

## 2023-11-26 DIAGNOSIS — R945 Abnormal results of liver function studies: Secondary | ICD-10-CM | POA: Diagnosis not present

## 2023-11-26 DIAGNOSIS — I10 Essential (primary) hypertension: Secondary | ICD-10-CM | POA: Diagnosis not present

## 2023-11-26 HISTORY — PX: LAPAROSCOPIC LYSIS OF ADHESIONS: SHX5905

## 2023-11-26 HISTORY — PX: CHOLECYSTECTOMY: SHX55

## 2023-11-26 LAB — I-STAT ARTERIAL BLOOD GAS, ED
Acid-Base Excess: 0 mmol/L (ref 0.0–2.0)
Bicarbonate: 24.7 mmol/L (ref 20.0–28.0)
Calcium, Ion: 1.16 mmol/L (ref 1.15–1.40)
HCT: 35 % — ABNORMAL LOW (ref 36.0–46.0)
Hemoglobin: 11.9 g/dL — ABNORMAL LOW (ref 12.0–15.0)
O2 Saturation: 59 %
Patient temperature: 98
Potassium: 4.1 mmol/L (ref 3.5–5.1)
Sodium: 141 mmol/L (ref 135–145)
TCO2: 26 mmol/L (ref 22–32)
pCO2 arterial: 39 mmHg (ref 32–48)
pH, Arterial: 7.407 (ref 7.35–7.45)
pO2, Arterial: 30 mmHg — CL (ref 83–108)

## 2023-11-26 LAB — HEPARIN LEVEL (UNFRACTIONATED): Heparin Unfractionated: >1.1 [IU]/mL — ABNORMAL HIGH (ref 0.30–0.70)

## 2023-11-26 LAB — CBC
HCT: 37.5 % (ref 36.0–46.0)
Hemoglobin: 11.8 g/dL — ABNORMAL LOW (ref 12.0–15.0)
MCH: 29.1 pg (ref 26.0–34.0)
MCHC: 31.5 g/dL (ref 30.0–36.0)
MCV: 92.6 fL (ref 80.0–100.0)
Platelets: 183 K/uL (ref 150–400)
RBC: 4.05 MIL/uL (ref 3.87–5.11)
RDW: 14.4 % (ref 11.5–15.5)
WBC: 6.8 K/uL (ref 4.0–10.5)
nRBC: 0 % (ref 0.0–0.2)

## 2023-11-26 LAB — BASIC METABOLIC PANEL WITH GFR
Anion gap: 10 (ref 5–15)
BUN: 16 mg/dL (ref 6–20)
CO2: 23 mmol/L (ref 22–32)
Calcium: 9.2 mg/dL (ref 8.9–10.3)
Chloride: 109 mmol/L (ref 98–111)
Creatinine, Ser: 0.82 mg/dL (ref 0.44–1.00)
GFR, Estimated: >60 mL/min (ref >60)
Glucose, Bld: 85 mg/dL (ref 70–99)
Potassium: 3.5 mmol/L (ref 3.5–5.1)
Sodium: 142 mmol/L (ref 135–145)

## 2023-11-26 LAB — HEPATIC FUNCTION PANEL
ALT: 84 U/L — ABNORMAL HIGH (ref 0–44)
AST: 88 U/L — ABNORMAL HIGH (ref 15–41)
Albumin: 3.2 g/dL — ABNORMAL LOW (ref 3.5–5.0)
Alkaline Phosphatase: 115 U/L (ref 38–126)
Bilirubin, Direct: 0.1 mg/dL (ref 0.0–0.2)
Indirect Bilirubin: 0.6 mg/dL (ref 0.3–0.9)
Total Bilirubin: 0.7 mg/dL (ref 0.0–1.2)
Total Protein: 6.2 g/dL — ABNORMAL LOW (ref 6.5–8.1)

## 2023-11-26 LAB — APTT: aPTT: 120 s — ABNORMAL HIGH (ref 24–36)

## 2023-11-26 LAB — HIV ANTIBODY (ROUTINE TESTING W REFLEX): HIV Screen 4th Generation wRfx: NONREACTIVE

## 2023-11-26 SURGERY — LAPAROSCOPIC CHOLECYSTECTOMY
Anesthesia: General | Site: Abdomen

## 2023-11-26 MED ORDER — FENTANYL CITRATE (PF) 100 MCG/2ML IJ SOLN
INTRAMUSCULAR | Status: DC | PRN
  Administered 2023-11-26: 100 ug via INTRAVENOUS
  Administered 2023-11-26: 50 ug via INTRAVENOUS

## 2023-11-26 MED ORDER — LIDOCAINE HCL (CARDIAC) PF 100 MG/5ML IV SOSY
PREFILLED_SYRINGE | INTRAVENOUS | Status: DC | PRN
  Administered 2023-11-26: 100 mg via INTRAVENOUS

## 2023-11-26 MED ORDER — PHENYLEPHRINE 80 MCG/ML (10ML) SYRINGE FOR IV PUSH (FOR BLOOD PRESSURE SUPPORT)
PREFILLED_SYRINGE | INTRAVENOUS | Status: DC | PRN
  Administered 2023-11-26 (×3): 80 ug via INTRAVENOUS

## 2023-11-26 MED ORDER — INDOCYANINE GREEN 25 MG IV SOLR
1.2500 mg | Freq: Once | INTRAVENOUS | Status: AC
  Administered 2023-11-26: 1.25 mg via INTRAVENOUS
  Filled 2023-11-26: qty 10

## 2023-11-26 MED ORDER — ROCURONIUM BROMIDE 100 MG/10ML IV SOLN
INTRAVENOUS | Status: DC | PRN
  Administered 2023-11-26: 70 mg via INTRAVENOUS

## 2023-11-26 MED ORDER — LACTATED RINGERS IR SOLN
Status: DC | PRN
Start: 2023-11-26 — End: 2023-11-26
  Administered 2023-11-26: 1000 mL

## 2023-11-26 MED ORDER — HEMOSTATIC AGENTS (NO CHARGE) OPTIME
TOPICAL | Status: DC | PRN
  Administered 2023-11-26: 1 via TOPICAL

## 2023-11-26 MED ORDER — SUGAMMADEX SODIUM 200 MG/2ML IV SOLN
INTRAVENOUS | Status: AC
  Filled 2023-11-26: qty 4

## 2023-11-26 MED ORDER — SODIUM CHLORIDE 0.9 % IR SOLN
Status: DC | PRN
  Administered 2023-11-26: 1000 mL

## 2023-11-26 MED ORDER — OXYCODONE HCL 5 MG/5ML PO SOLN: 5.0000 mg | Freq: Once | ORAL | Status: DC | PRN

## 2023-11-26 MED ORDER — HEPARIN (PORCINE) 25000 UT/250ML-% IV SOLN
1300.0000 [IU]/h | INTRAVENOUS | Status: DC
  Administered 2023-11-26: 1300 [IU]/h via INTRAVENOUS
  Filled 2023-11-26: qty 250

## 2023-11-26 MED ORDER — BUPIVACAINE-EPINEPHRINE (PF) 0.25% -1:200000 IJ SOLN
INTRAMUSCULAR | Status: AC
  Filled 2023-11-26: qty 30

## 2023-11-26 MED ORDER — MIDAZOLAM HCL 2 MG/2ML IJ SOLN
INTRAMUSCULAR | Status: AC
  Filled 2023-11-26: qty 2

## 2023-11-26 MED ORDER — FENTANYL CITRATE PF 50 MCG/ML IJ SOSY
25.0000 ug | PREFILLED_SYRINGE | INTRAMUSCULAR | Status: DC | PRN
  Administered 2023-11-26 (×2): 25 ug via INTRAVENOUS

## 2023-11-26 MED ORDER — DEXAMETHASONE SODIUM PHOSPHATE 10 MG/ML IJ SOLN
INTRAMUSCULAR | Status: DC | PRN
  Administered 2023-11-26: 5 mg via INTRAVENOUS

## 2023-11-26 MED ORDER — FENTANYL CITRATE (PF) 100 MCG/2ML IJ SOLN
INTRAMUSCULAR | Status: AC
Start: 2023-11-26 — End: 2023-11-26
  Filled 2023-11-26: qty 2

## 2023-11-26 MED ORDER — CHLORHEXIDINE GLUCONATE 0.12 % MT SOLN
15.0000 mL | Freq: Once | OROMUCOSAL | Status: AC
  Administered 2023-11-26: 15 mL via OROMUCOSAL

## 2023-11-26 MED ORDER — SUGAMMADEX SODIUM 200 MG/2ML IV SOLN
INTRAVENOUS | Status: DC | PRN
  Administered 2023-11-26: 200 mg via INTRAVENOUS

## 2023-11-26 MED ORDER — DROPERIDOL 2.5 MG/ML IJ SOLN
INTRAMUSCULAR | Status: AC
  Filled 2023-11-26: qty 2

## 2023-11-26 MED ORDER — ROCURONIUM BROMIDE 10 MG/ML (PF) SYRINGE
PREFILLED_SYRINGE | INTRAVENOUS | Status: AC
  Filled 2023-11-26: qty 10

## 2023-11-26 MED ORDER — DROPERIDOL 2.5 MG/ML IJ SOLN
0.6250 mg | Freq: Once | INTRAMUSCULAR | Status: AC | PRN
  Administered 2023-11-26: 0.625 mg via INTRAVENOUS

## 2023-11-26 MED ORDER — BUPIVACAINE-EPINEPHRINE 0.25% -1:200000 IJ SOLN
INTRAMUSCULAR | Status: DC | PRN
  Administered 2023-11-26: 30 mL

## 2023-11-26 MED ORDER — FENTANYL CITRATE PF 50 MCG/ML IJ SOSY
PREFILLED_SYRINGE | INTRAMUSCULAR | Status: AC
  Filled 2023-11-26: qty 1

## 2023-11-26 MED ORDER — LIDOCAINE HCL (PF) 2 % IJ SOLN
INTRAMUSCULAR | Status: AC
  Filled 2023-11-26: qty 5

## 2023-11-26 MED ORDER — LACTATED RINGERS IV SOLN: INTRAVENOUS | Status: DC

## 2023-11-26 MED ORDER — KETOROLAC TROMETHAMINE 30 MG/ML IJ SOLN
INTRAMUSCULAR | Status: AC
  Filled 2023-11-26: qty 1

## 2023-11-26 MED ORDER — MIDAZOLAM HCL 5 MG/5ML IJ SOLN
INTRAMUSCULAR | Status: DC | PRN
  Administered 2023-11-26: 2 mg via INTRAVENOUS

## 2023-11-26 MED ORDER — OXYCODONE HCL 5 MG PO TABS: 5.0000 mg | ORAL_TABLET | Freq: Once | ORAL | Status: DC | PRN

## 2023-11-26 MED ORDER — SODIUM CHLORIDE 0.9 % IV SOLN
2.0000 g | INTRAVENOUS | Status: DC
  Administered 2023-11-26: 2 g via INTRAVENOUS
  Filled 2023-11-26: qty 20

## 2023-11-26 MED ORDER — PROPOFOL 10 MG/ML IV BOLUS
INTRAVENOUS | Status: AC
  Filled 2023-11-26: qty 20

## 2023-11-26 MED ORDER — FENTANYL CITRATE (PF) 100 MCG/2ML IJ SOLN
INTRAMUSCULAR | Status: AC
  Filled 2023-11-26: qty 2

## 2023-11-26 MED ORDER — ACETAMINOPHEN 500 MG PO TABS
1000.0000 mg | ORAL_TABLET | Freq: Four times a day (QID) | ORAL | Status: DC
  Administered 2023-11-26 – 2023-11-27 (×3): 1000 mg via ORAL
  Filled 2023-11-26 (×4): qty 2

## 2023-11-26 MED ORDER — ACETAMINOPHEN 10 MG/ML IV SOLN: 1000.0000 mg | Freq: Once | INTRAVENOUS | Status: DC | PRN

## 2023-11-26 MED ORDER — PROPOFOL 10 MG/ML IV BOLUS
INTRAVENOUS | Status: DC | PRN
  Administered 2023-11-26: 200 mg via INTRAVENOUS

## 2023-11-26 MED ORDER — PHENYLEPHRINE 80 MCG/ML (10ML) SYRINGE FOR IV PUSH (FOR BLOOD PRESSURE SUPPORT)
PREFILLED_SYRINGE | INTRAVENOUS | Status: AC
  Filled 2023-11-26: qty 10

## 2023-11-26 MED ORDER — ONDANSETRON HCL 4 MG/2ML IJ SOLN
INTRAMUSCULAR | Status: DC | PRN
  Administered 2023-11-26: 4 mg via INTRAVENOUS

## 2023-11-26 MED ORDER — GADOBUTROL 1 MMOL/ML IV SOLN
10.0000 mL | Freq: Once | INTRAVENOUS | Status: AC | PRN
  Administered 2023-11-26: 10 mL via INTRAVENOUS

## 2023-11-26 SURGICAL SUPPLY — 45 items
APPLICATOR ARISTA FLEXITIP XL (MISCELLANEOUS) ×1 IMPLANT
BAG COUNTER SPONGE SURGICOUNT (BAG) IMPLANT
CABLE HIGH FREQUENCY MONO STRZ (ELECTRODE) ×3 IMPLANT
CHLORAPREP W/TINT 26 (MISCELLANEOUS) ×4 IMPLANT
CLIP APPLIE 5 13 M/L LIGAMAX5 (MISCELLANEOUS) IMPLANT
CLIP APPLIE ROT 10 11.4 M/L (STAPLE) ×1 IMPLANT
CLIP LIGATING HEMO O LOK GREEN (MISCELLANEOUS) IMPLANT
COVER MAYO STAND XLG (MISCELLANEOUS) IMPLANT
COVER SURGICAL LIGHT HANDLE (MISCELLANEOUS) ×3 IMPLANT
DERMABOND ADVANCED .7 DNX12 (GAUZE/BANDAGES/DRESSINGS) ×1 IMPLANT
DRAPE C-ARM 42X120 X-RAY (DRAPES) IMPLANT
DRSG TEGADERM 2-3/8X2-3/4 SM (GAUZE/BANDAGES/DRESSINGS) ×9 IMPLANT
DRSG TEGADERM 4X4.75 (GAUZE/BANDAGES/DRESSINGS) ×3 IMPLANT
ELECT REM PT RETURN 15FT ADLT (MISCELLANEOUS) ×3 IMPLANT
GAUZE SPONGE 2X2 8PLY STRL LF (GAUZE/BANDAGES/DRESSINGS) ×3 IMPLANT
GLOVE BIO SURGEON STRL SZ7.5 (GLOVE) ×3 IMPLANT
GLOVE INDICATOR 8.0 STRL GRN (GLOVE) ×3 IMPLANT
GOWN STRL REUS W/ TWL XL LVL3 (GOWN DISPOSABLE) ×3 IMPLANT
GRASPER SUT TROCAR 14GX15 (MISCELLANEOUS) IMPLANT
HEMOSTAT ARISTA ABSORB 3G PWDR (HEMOSTASIS) ×1 IMPLANT
HEMOSTAT SNOW SURGICEL 2X4 (HEMOSTASIS) ×1 IMPLANT
IRRIGATION SUCT STRKRFLW 2 WTP (MISCELLANEOUS) ×3 IMPLANT
KIT BASIN OR (CUSTOM PROCEDURE TRAY) ×3 IMPLANT
KIT TURNOVER KIT A (KITS) ×3 IMPLANT
LHOOK LAP DISP 36CM (ELECTROSURGICAL) IMPLANT
NDL SPNL 22GX3.5 QUINCKE BK (NEEDLE) IMPLANT
NEEDLE SPNL 22GX3.5 QUINCKE BK (NEEDLE) ×2 IMPLANT
POUCH RETRIEVAL ECOSAC 10 (ENDOMECHANICALS) ×3 IMPLANT
SCISSORS LAP 5X35 DISP (ENDOMECHANICALS) ×3 IMPLANT
SET CHOLANGIOGRAPH MIX (MISCELLANEOUS) IMPLANT
SET TUBE SMOKE EVAC HIGH FLOW (TUBING) ×3 IMPLANT
SLEEVE ADV FIXATION 5X100MM (TROCAR) ×3 IMPLANT
SPIKE FLUID TRANSFER (MISCELLANEOUS) ×3 IMPLANT
STRIP CLOSURE SKIN 1/2X4 (GAUZE/BANDAGES/DRESSINGS) ×3 IMPLANT
SUT MNCRL AB 4-0 PS2 18 (SUTURE) ×4 IMPLANT
SUT VIC AB 0 UR5 27 (SUTURE) IMPLANT
SUT VICRYL 0 TIES 12 18 (SUTURE) IMPLANT
SUT VICRYL 0 UR6 27IN ABS (SUTURE) IMPLANT
SYR 20ML LL LF (SYRINGE) ×1 IMPLANT
TOWEL OR 17X26 10 PK STRL BLUE (TOWEL DISPOSABLE) ×3 IMPLANT
TRAY LAPAROSCOPIC (CUSTOM PROCEDURE TRAY) ×3 IMPLANT
TROCAR ADV FIXATION 12X100MM (TROCAR) IMPLANT
TROCAR ADV FIXATION 5X100MM (TROCAR) ×3 IMPLANT
TROCAR BALLN 12MMX100 BLUNT (TROCAR) IMPLANT
TROCAR XCEL NON-BLD 5MMX100MML (ENDOMECHANICALS) IMPLANT

## 2023-11-26 NOTE — Transfer of Care (Signed)
 Immediate Anesthesia Transfer of Care Note  Patient: [name]  Procedure(s) Performed: LAPAROSCOPIC CHOLECYSTECTOMY (Abdomen) LYSIS, ADHESIONS, LAPAROSCOPIC (Abdomen)  Patient Location: PACU  Anesthesia Type:General  Level of Consciousness: awake and patient cooperative  Airway & Oxygen Therapy: Patient Spontanous Breathing and Patient connected to face mask  Post-op Assessment: Report given to RN and Post -op Vital signs reviewed and stable  Post vital signs: Reviewed and stable  Last Vitals:  Vitals Value Taken Time  BP 152/70 11/26/23 15:57  Temp    Pulse 79 11/26/23 15:59  Resp 19 11/26/23 15:59  SpO2 95 % 11/26/23 15:59  Vitals shown include unfiled device data.  Last Pain:  Vitals:   11/26/23 1220  TempSrc:   PainSc: 0-No pain      Patients Stated Pain Goal: 1 (11/25/23 1758)  Complications: No notable events documented.

## 2023-11-26 NOTE — Progress Notes (Signed)
   11/26/23 0957  TOC Brief Assessment  Insurance and Status Reviewed  Patient has primary care physician Yes  Home environment has been reviewed resides in a private residence  Prior level of function: Independent  Prior/Current Home Services No current home services  Social Drivers of Health Review SDOH reviewed no interventions necessary  Readmission risk has been reviewed Yes  Transition of care needs no transition of care needs at this time

## 2023-11-26 NOTE — Consult Note (Signed)
 Consult Note  [name] Nov 26, 1965  [id number].    Requesting MD: Ivonne Mustache, MD Chief Complaint/Reason for Consult: Acute cholecystitis  HPI:  Patient is a 58 year old female with PMH significant Hx of DVTs on xarelto  (LD DOAC 8/19), Hx of endometrial/ovarian cancer s/p surgical resection in 2016 by Dr. Eloy , HTN, HLD, Morbid Obesity - BMI 61.2, Small to moderate hiatal hernia, Hx of SLE, Hx of CVA and Pre-diabetes. She presented to [facility] yesterday with acute onset epigastric abdominal pain. Associated nausea and vomiting. Has been on Wegovvy the last 4 months and has had more constipation and mild abdominal pain as well. Did have multiple BMs yesterday. Prior abdominal surgery includes cesarean section x2, colostomy in 2008 ?for diverticulitis and subsequent colostomy takedown, TAH with BSO and ventral hernia repair and bowel resection in 2016. Her symptoms of abdominal pain, nausea and vomiting are currently resolved. Allergy to tape and PCNs with reaction of rash. Husband is at bedside in the room today.   ROS: Negative other than HPI  Family History  Problem Relation Age of Onset   Diabetes Mother    Hypertension Mother    Hyperlipidemia Mother    Colon polyps Mother        4 total   Fibroids Mother        s/p hysterectomy   Hyperlipidemia Father    Lung cancer Father    Kidney failure Maternal Grandmother 68   Pancreatic cancer Paternal Grandmother        dx. >50   Diabetes Paternal Grandfather    Diabetes Daughter    Hodgkin's lymphoma Daughter 75   Asthma Maternal Aunt        severe - d. 24   Prostate cancer Maternal Uncle 37   Diabetes Paternal Aunt    Diabetes Paternal Aunt    Diabetes Paternal Uncle    Heart disease Neg Hx    Stroke Neg Hx    Colon cancer Neg Hx    Stomach cancer Neg Hx    Esophageal cancer Neg Hx    Rectal cancer Neg Hx     Past Medical History:  Diagnosis Date   Allergy    Anemia    Anxiety    Arthritis     Aspiration pneumonia (HCC)    Blood transfusion without reported diagnosis    Cough    Cough 06/11/2018   Difficulty sleeping 06/11/2018   Diverticulitis with perforation 2010   DVT (deep vein thrombosis) in pregnancy 11/24/2016   DVT, lower extremity (HCC)    Endometrial cancer (HCC)    Enlarged lymph nodes 08/13/2017   Family history of adverse reaction to anesthesia    pts daughter had difficulty awakening following anesthesia, long time to wake up   GERD (gastroesophageal reflux disease)    Headache    Hemoptysis 04/22/2018   History of bronchitis    History of ear infections    Hospital discharge follow-up 03/02/2019   Hx of migraines    Hyperlipidemia    Hypertension    IBS (irritable bowel syndrome)    Kidney infection    Lupus (systemic lupus erythematosus) (HCC) 02/2017   Morbid obesity (HCC)    Neck pain 01/13/2019   Ovarian cancer (HCC) dx'd 01/2015   PONV (postoperative nausea and vomiting)    Port-A-Cath in place    Right side   Portacath in place 09/12/2015   Pre-diabetes    Pyelonephritis    Right knee pain  11/28/2011   Scleroderma (HCC)    Screen for colon cancer 03/02/2019   Slow rate of speech 08/22/2015   Chronic from CVA.  She also has loss of left nasolabial fold and slight left facial droop/small asymmetry on the left side secondary to CVA   UTI (urinary tract infection) 05/07/2018    Past Surgical History:  Procedure Laterality Date   ABDOMINAL HYSTERECTOMY N/A 03/13/2015   Procedure: TOTAL HYSTERECTOMY ABDOMINAL BILATERAL SALPINGO OOPHORECTOMY RADICAL TUMOR DEBUKING;  Surgeon: Maurilio Ship, MD;  Location: [facility];  Service: Gynecology;  Laterality: N/A;   BOWEL RESECTION  03/13/2015   Procedure: SMALL BOWEL RESECTION;  Surgeon: Maurilio Ship, MD;  Location: [facility];  Service: Gynecology;;   CESAREAN SECTION  1983 and 1984   COLONOSCOPY WITH PROPOFOL  N/A 07/19/2020   Procedure: COLONOSCOPY WITH PROPOFOL ;  Surgeon: Legrand Victory LITTIE DOUGLAS, MD;  Location: [facility];  Service: Gastroenterology;  Laterality: N/A;   COLOSTOMY  04/07/2006   COLOSTOMY TAKEDOWN     ESOPHAGOGASTRODUODENOSCOPY (EGD) WITH PROPOFOL  N/A 12/21/2018   Procedure: ESOPHAGOGASTRODUODENOSCOPY (EGD) WITH PROPOFOL ;  Surgeon: Legrand Victory LITTIE DOUGLAS, MD;  Location: [facility];  Service: Gastroenterology;  Laterality: N/A;   HEMOSTASIS CLIP PLACEMENT  07/19/2020   Procedure: HEMOSTASIS CLIP PLACEMENT;  Surgeon: Legrand Victory LITTIE DOUGLAS, MD;  Location: [facility];  Service: Gastroenterology;;   IR REMOVAL TUN ACCESS W/ PORT W/O FL MOD SED  12/17/2022   LAPAROTOMY N/A 03/13/2015   Procedure: EXPLORATORY LAPAROTOMY;  Surgeon: Maurilio Ship, MD;  Location: [facility];  Service: Gynecology;  Laterality: N/A;   POLYPECTOMY  07/19/2020   Procedure: POLYPECTOMY;  Surgeon: Legrand Victory LITTIE DOUGLAS, MD;  Location: [facility];  Service: Gastroenterology;;   TONSILLECTOMY AND ADENOIDECTOMY  04/08/1995   VENTRAL HERNIA REPAIR  03/13/2015   Procedure: HERNIA REPAIR VENTRAL ADULT;  Surgeon: Maurilio Ship, MD;  Location: [facility];  Service: Gynecology;;    Social History:  reports that she has never smoked. She has never used smokeless tobacco. She reports current alcohol use. She reports that she does not use drugs.  Allergies:  Allergies  Allergen Reactions   Tape Itching and Other (See Comments)    Surgical tape - NO TEGADERM!!!! It pulls OFF the skin!!   Penicillins Rash    Has patient had a PCN reaction causing immediate rash, facial/tongue/throat swelling, SOB or lightheadedness with hypotension: no Has patient had a PCN reaction causing severe rash involving mucus membranes or skin necrosis: no Has patient had a PCN reaction that required hospitalization in the hospital at the time Has patient had a PCN reaction occurring within the last 10 years: no If all of the above answers are NO, then may proceed with Cephalosporin use.     Medications Prior to Admission  Medication Sig Dispense Refill    acetaminophen  (TYLENOL ) 500 MG tablet Take 2 tablets (1,000 mg total) by mouth every 6 (six) hours as needed for moderate pain. 30 tablet 0   albuterol  (PROVENTIL ) (2.5 MG/3ML) 0.083% nebulizer solution Take 3 mLs (2.5 mg total) by nebulization every 6 (six) hours as needed for wheezing or shortness of breath. 150 mL 1   albuterol  (VENTOLIN  HFA) 108 (90 Base) MCG/ACT inhaler INHALE 2 PUFFS INTO THE LUNGS EVERY 6 HOURS AS NEEDED FOR WHEEZING OR SHORTNESS OF BREATH 18 g 0   amLODipine -olmesartan  (AZOR ) 5-20 MG tablet Take 1 tablet by mouth daily. 90 tablet 1   baclofen  (LIORESAL ) 10 MG tablet Take 1 tablet (10 mg total) by mouth  2 (two) times daily as needed for muscle spasms. 30 tablet 0   cetirizine  (ZYRTEC ) 10 MG tablet Take 10 mg by mouth as needed for allergies or rhinitis.     Cholecalciferol (VITAMIN D3) 50 MCG (2000 UT) TABS Take 2,000 Units by mouth daily.     famotidine  (PEPCID ) 40 MG tablet Take 1 tablet (40 mg total) by mouth at bedtime. (Patient taking differently: Take 40 mg by mouth at bedtime as needed for heartburn or indigestion.) 90 tablet 3   ferrous sulfate  325 (65 FE) MG EC tablet Take 1 tablet (325 mg total) by mouth daily with breakfast. (Patient taking differently: Take 325 mg by mouth every other day.) 30 tablet 2   fesoterodine  (TOVIAZ ) 8 MG TB24 tablet Take 1 tablet (8 mg total) by mouth daily. (Patient taking differently: Take 8 mg by mouth at bedtime.) 90 tablet 2   fluticasone  (FLONASE ) 50 MCG/ACT nasal spray Place 2 sprays into both nostrils in the morning and at bedtime. (Patient taking differently: Place 2 sprays into both nostrils 2 (two) times daily as needed for allergies or rhinitis.) 18.2 mL 5   folic acid  (FOLVITE ) 1 MG tablet Take 1 tablet (1 mg total) by mouth daily. 90 tablet 3   furosemide  (LASIX ) 20 MG tablet Take 1 tablet (20 mg total) by mouth daily as needed for edema or fluid. 30 tablet 0   omeprazole  (PRILOSEC) 40 MG capsule TAKE 1 CAPSULE(40 MG) BY MOUTH  IN THE MORNING AND AT BEDTIME (Patient taking differently: Take 40 mg by mouth 2 (two) times daily as needed (for reflux).) 60 capsule 1   ondansetron  (ZOFRAN ) 4 MG tablet Take 1 tablet (4 mg total) by mouth every 6 (six) hours as needed for nausea. 20 tablet 0   OXYGEN Inhale 2 L/min into the lungs at bedtime as needed (for shortness of breath).     Semaglutide -Weight Management (WEGOVY ) 1 MG/0.5ML SOAJ Inject 1 mg into the skin once a week. (Patient taking differently: Inject 1 mg into the skin every Monday.) 2 mL 1   Tiotropium Bromide-Olodaterol (STIOLTO RESPIMAT ) 2.5-2.5 MCG/ACT AERS Inhale 2 puffs into the lungs daily. 1 each 11   traMADol  (ULTRAM ) 50 MG tablet Take 50 mg by mouth daily as needed (for pain).     triamcinolone  cream (KENALOG ) 0.1 % Apply 1 Application topically 2 (two) times daily as needed (Skin irritation.). 45 g 0   XARELTO  20 MG TABS tablet TAKE 1 TABLET(20 MG) BY MOUTH IN THE MORNING 90 tablet 1   diclofenac  Sodium (VOLTAREN ) 1 % GEL Apply to knee once daily as needed (Patient not taking: Reported on 11/25/2023) 100 g 1   dicyclomine  (BENTYL ) 10 MG capsule Take 10 mg by mouth as needed.     ezetimibe  (ZETIA ) 10 MG tablet Take 1 tablet (10 mg total) by mouth daily. (Patient not taking: Reported on 11/25/2023) 90 tablet 3   valACYclovir  (VALTREX ) 500 MG tablet TAKE 1 TABLET(500 MG) BY MOUTH TWICE DAILY FOR 3 DAYS. START OF OUTBREAK (Patient not taking: Reported on 11/25/2023) 6 tablet 2   Zoster Vaccine Adjuvanted (SHINGRIX ) injection Administer Shingrix  vaccination now and repeat in two months (Patient not taking: Reported on 11/25/2023) 1 each 1    Blood pressure 137/65, pulse 78, temperature 98.4 F (36.9 C), temperature source Oral, resp. rate 18, last menstrual period 10/16/2013, SpO2 98%. Physical Exam:  General: pleasant, WD, morbidly obese female who is laying in bed in NAD HEENT: head is normocephalic, atraumatic.  Sclera  are anicteric.  Ears and nose without any  masses or lesions.  Mouth is pink and moist Heart: regular, rate, and rhythm.   Lungs: No wheezes, rhonchi, or rales noted.  Respiratory effort nonlabored Abd: soft, Mild TTP of RUQ on deep palpation, ND, multiple prior surgical scars MS: all 4 extremities are symmetrical with no cyanosis, clubbing, or edema. Skin: warm and dry with no masses, lesions, or rashes Neuro: Cranial nerves 2-12 grossly intact, sensation is normal throughout Psych: A&Ox3 with an appropriate affect.   Results for orders placed or performed during the hospital encounter of 11/25/23 (from the past 48 hours)  Comprehensive metabolic panel     Status: Abnormal   Collection Time: 11/25/23  9:23 AM  Result Value Ref Range   Sodium 142 135 - 145 mmol/L   Potassium 4.0 3.5 - 5.1 mmol/L   Chloride 107 98 - 111 mmol/L   CO2 22 22 - 32 mmol/L   Glucose, Bld 139 (H) 70 - 99 mg/dL    Comment: Glucose reference range applies only to samples taken after fasting for at least 8 hours.   BUN 17 6 - 20 mg/dL   Creatinine, Ser 8.78 (H) 0.44 - 1.00 mg/dL   Calcium  9.9 8.9 - 10.3 mg/dL   Total Protein 6.8 6.5 - 8.1 g/dL   Albumin 3.9 3.5 - 5.0 g/dL   AST 752 (H) 15 - 41 U/L   ALT 113 (H) 0 - 44 U/L   Alkaline Phosphatase 157 (H) 38 - 126 U/L   Total Bilirubin 1.3 (H) 0.0 - 1.2 mg/dL   GFR, Estimated 52 (L) >60 mL/min    Comment: (NOTE) Calculated using the CKD-EPI Creatinine Equation (2021)    Anion gap 13 5 - 15    Comment: Performed at [facility], [address] [place], [place] [address]  CBC with Differential     Status: None   Collection Time: 11/25/23  9:23 AM  Result Value Ref Range   WBC 7.2 4.0 - 10.5 K/uL   RBC 4.38 3.87 - 5.11 MIL/uL   Hemoglobin 12.6 12.0 - 15.0 g/dL   HCT 60.7 63.9 - 53.9 %   MCV 89.5 80.0 - 100.0 fL   MCH 28.8 26.0 - 34.0 pg   MCHC 32.1 30.0 - 36.0 g/dL   RDW 85.6 88.4 - 84.4 %   Platelets 197 150 - 400 K/uL   nRBC 0.0 0.0 - 0.2 %   Neutrophils Relative % 76 %    Neutro Abs 5.5 1.7 - 7.7 K/uL   Lymphocytes Relative 15 %   Lymphs Abs 1.1 0.7 - 4.0 K/uL   Monocytes Relative 7 %   Monocytes Absolute 0.5 0.1 - 1.0 K/uL   Eosinophils Relative 1 %   Eosinophils Absolute 0.1 0.0 - 0.5 K/uL   Basophils Relative 1 %   Basophils Absolute 0.0 0.0 - 0.1 K/uL   Immature Granulocytes 0 %   Abs Immature Granulocytes 0.02 0.00 - 0.07 K/uL    Comment: Performed at [facility], [address], [place], [place] [address]  Lipase, blood     Status: None   Collection Time: 11/25/23  9:23 AM  Result Value Ref Range   Lipase 32 11 - 51 U/L    Comment: Performed at [facility], [address] [place], [place] [address]  Urinalysis, Routine w reflex microscopic -Urine, Clean Catch     Status: Abnormal   Collection Time: 11/25/23 12:00 PM  Result Value  Ref Range   Color, Urine YELLOW YELLOW   APPearance CLEAR CLEAR   Specific Gravity, Urine 1.040 (H) 1.005 - 1.030   pH 5.5 5.0 - 8.0   Glucose, UA NEGATIVE NEGATIVE mg/dL   Hgb urine dipstick NEGATIVE NEGATIVE   Bilirubin Urine NEGATIVE NEGATIVE   Ketones, ur NEGATIVE NEGATIVE mg/dL   Protein, ur NEGATIVE NEGATIVE mg/dL   Nitrite NEGATIVE NEGATIVE   Leukocytes,Ua NEGATIVE NEGATIVE    Comment: Performed at [facility], [address] [place], [place] [address]  I-Stat arterial blood gas, ED     Status: Abnormal   Collection Time: 11/25/23  2:04 PM  Result Value Ref Range   pH, Arterial 7.407 7.35 - 7.45   pCO2 arterial 39.0 32 - 48 mmHg   pO2, Arterial 30 (LL) 83 - 108 mmHg   Bicarbonate 24.7 20.0 - 28.0 mmol/L   TCO2 26 22 - 32 mmol/L   O2 Saturation 59 %   Acid-Base Excess 0.0 0.0 - 2.0 mmol/L   Sodium 141 135 - 145 mmol/L   Potassium 4.1 3.5 - 5.1 mmol/L   Calcium , Ion 1.16 1.15 - 1.40 mmol/L   HCT 35.0 (L) 36.0 - 46.0 %   Hemoglobin 11.9 (L) 12.0 - 15.0 g/dL   Patient temperature 01.9 F    Sample type ARTERIAL    Comment NOTIFIED  PHYSICIAN   Basic metabolic panel     Status: None   Collection Time: 11/26/23  4:33 AM  Result Value Ref Range   Sodium 142 135 - 145 mmol/L   Potassium 3.5 3.5 - 5.1 mmol/L   Chloride 109 98 - 111 mmol/L   CO2 23 22 - 32 mmol/L   Glucose, Bld 85 70 - 99 mg/dL    Comment: Glucose reference range applies only to samples taken after fasting for at least 8 hours.   BUN 16 6 - 20 mg/dL   Creatinine, Ser 9.17 0.44 - 1.00 mg/dL   Calcium  9.2 8.9 - 10.3 mg/dL   GFR, Estimated >39 >39 mL/min    Comment: (NOTE) Calculated using the CKD-EPI Creatinine Equation (2021)    Anion gap 10 5 - 15    Comment: Performed at [facility], [address], [place], [place] [address]  CBC     Status: Abnormal   Collection Time: 11/26/23  4:33 AM  Result Value Ref Range   WBC 6.8 4.0 - 10.5 K/uL   RBC 4.05 3.87 - 5.11 MIL/uL   Hemoglobin 11.8 (L) 12.0 - 15.0 g/dL   HCT 62.4 63.9 - 53.9 %   MCV 92.6 80.0 - 100.0 fL   MCH 29.1 26.0 - 34.0 pg   MCHC 31.5 30.0 - 36.0 g/dL   RDW 85.5 88.4 - 84.4 %   Platelets 183 150 - 400 K/uL   nRBC 0.0 0.0 - 0.2 %    Comment: Performed at [facility], [address] [place], [place] [address]  Heparin  level (unfractionated)     Status: Abnormal   Collection Time: 11/26/23  4:35 AM  Result Value Ref Range   Heparin  Unfractionated >1.10 (H) 0.30 - 0.70 IU/mL    Comment: (NOTE) The clinical reportable range upper limit is being lowered to >1.10 to align with the FDA approved guidance for the current laboratory assay.  If heparin  results are below expected values, and patient dosage has  been confirmed, suggest follow up testing of antithrombin III levels. Performed at [facility], [address] [place], [place] [address]   APTT     Status: Abnormal   Collection Time: 11/26/23  4:35 AM  Result Value Ref Range   aPTT 120 (H) 24 - 36 seconds    Comment:        IF BASELINE aPTT IS ELEVATED, SUGGEST  PATIENT RISK ASSESSMENT BE USED TO DETERMINE APPROPRIATE ANTICOAGULANT THERAPY. Performed at [facility], [address], [place], [place] [address]   Hepatic function panel     Status: Abnormal   Collection Time: 11/26/23  4:35 AM  Result Value Ref Range   Total Protein 6.2 (L) 6.5 - 8.1 g/dL   Albumin 3.2 (L) 3.5 - 5.0 g/dL   AST 88 (H) 15 - 41 U/L   ALT 84 (H) 0 - 44 U/L   Alkaline Phosphatase 115 38 - 126 U/L   Total Bilirubin 0.7 0.0 - 1.2 mg/dL   Bilirubin, Direct 0.1 0.0 - 0.2 mg/dL   Indirect Bilirubin 0.6 0.3 - 0.9 mg/dL    Comment: Performed at [facility], [address], [place], [place] [address]   *Note: Due to a large number of results and/or encounters for the requested time period, some results have not been displayed. A complete set of results can be found in Results Review.   MR ABDOMEN MRCP W WO CONTAST Result Date: 11/26/2023 CLINICAL DATA:  Right upper quadrant pain. Cholelithiasis. Biliary ductal dilatation on recent ultrasound. EXAM: MRI ABDOMEN WITHOUT AND WITH CONTRAST (INCLUDING MRCP) TECHNIQUE: Multiplanar multisequence MR imaging of the abdomen was performed both before and after the administration of intravenous contrast. Heavily T2-weighted images of the biliary and pancreatic ducts were obtained, and three-dimensional MRCP images were rendered by post processing. CONTRAST:  10mL GADAVIST  GADOBUTROL  1 MMOL/ML IV SOLN COMPARISON:  Ultrasound and CT on 11/25/2023 FINDINGS: Lower chest: No acute findings. Hepatobiliary: No hepatic masses identified. Gallbladder is distended tiny gallstones are seen. Mild diffuse gallbladder wall thickening is seen, without pericholecystic inflammatory changes. These findings are equivocal for acute cholecystitis. Mild dilatation of intrahepatic bile ducts is seen, however, there is gradual tapering of the common bile duct which is nondilated measuring 5 mm in diameter. No evidence of  choledocholithiasis or biliary stricture. Pancreas: No mass or inflammatory changes. No evidence of pancreatic ductal dilatation or pancreas divisum. Spleen:  Within normal limits in size and appearance. Adrenals/Urinary Tract: No suspicious masses identified. Mild left renal parenchymal scarring noted. No evidence of hydronephrosis. Stomach/Bowel: Small to moderate hiatal hernia is seen. Otherwise unremarkable. Vascular/Lymphatic: No pathologically enlarged lymph nodes identified. No acute vascular findings. Other:  None. Musculoskeletal:  No suspicious bone lesions identified. IMPRESSION: Cholelithiasis, with mild diffuse gallbladder wall thickening. Acute cholecystitis cannot be excluded. Consider nuclear medicine hepatobiliary scan for further evaluation if clinically warranted. Mild dilatation of intrahepatic bile ducts, without no of choledocholithiasis or other obstructing etiology. Small to moderate hiatal hernia. Electronically Signed   By: Norleen DELENA Kil M.D.   On: 11/26/2023 08:49   MR 3D Recon At Scanner Result Date: 11/26/2023 CLINICAL DATA:  Right upper quadrant pain. Cholelithiasis. Biliary ductal dilatation on recent ultrasound. EXAM: MRI ABDOMEN WITHOUT AND WITH CONTRAST (INCLUDING MRCP) TECHNIQUE: Multiplanar multisequence MR imaging of the abdomen was performed both before and after the administration of intravenous contrast. Heavily T2-weighted images of the biliary and pancreatic ducts were obtained, and three-dimensional MRCP images were rendered by post processing. CONTRAST:  10mL GADAVIST  GADOBUTROL  1 MMOL/ML IV SOLN COMPARISON:  Ultrasound and CT on 11/25/2023  FINDINGS: Lower chest: No acute findings. Hepatobiliary: No hepatic masses identified. Gallbladder is distended tiny gallstones are seen. Mild diffuse gallbladder wall thickening is seen, without pericholecystic inflammatory changes. These findings are equivocal for acute cholecystitis. Mild dilatation of intrahepatic bile ducts is  seen, however, there is gradual tapering of the common bile duct which is nondilated measuring 5 mm in diameter. No evidence of choledocholithiasis or biliary stricture. Pancreas: No mass or inflammatory changes. No evidence of pancreatic ductal dilatation or pancreas divisum. Spleen:  Within normal limits in size and appearance. Adrenals/Urinary Tract: No suspicious masses identified. Mild left renal parenchymal scarring noted. No evidence of hydronephrosis. Stomach/Bowel: Small to moderate hiatal hernia is seen. Otherwise unremarkable. Vascular/Lymphatic: No pathologically enlarged lymph nodes identified. No acute vascular findings. Other:  None. Musculoskeletal:  No suspicious bone lesions identified. IMPRESSION: Cholelithiasis, with mild diffuse gallbladder wall thickening. Acute cholecystitis cannot be excluded. Consider nuclear medicine hepatobiliary scan for further evaluation if clinically warranted. Mild dilatation of intrahepatic bile ducts, without no of choledocholithiasis or other obstructing etiology. Small to moderate hiatal hernia. Electronically Signed   By: Norleen DELENA Kil M.D.   On: 11/26/2023 08:49   US  Abdomen Limited RUQ (LIVER/GB) Result Date: 11/25/2023 CLINICAL DATA:  [id number] Abdominal pain, acute, right upper quadrant [id number] EXAM: ULTRASOUND ABDOMEN LIMITED RIGHT UPPER QUADRANT COMPARISON:  November 25, 2023, December 14, 2020 FINDINGS: Gallbladder: Multiple small shadowing gallstones. Asymmetric wall thickening along the hepatic aspect of the gallbladder. No sonographic Murphy's sign noted by sonographer. Common bile duct: Diameter: 11 mm Liver: Normal echogenicity. No focal lesion identified. Mild central intrahepatic biliary ductal dilation. Portal vein is patent on color Doppler imaging with normal direction of blood flow towards the liver. Other: None. IMPRESSION: 1. Mild central intrahepatic biliary ductal dilation with commensurate common bile duct dilation, worrisome for downstream  obstruction, possibly from choledocholithiasis. Multiphase abdominal MRI with IV contrast recommended for further characterization. 2. Cholecystolithiasis. Asymmetric wall thickening along the hepatic aspect of the gallbladder, which in the absence of a sonographic Murphy's sign and pericholecystic fluid, may be related to biliary obstruction and stasis or acute hepatitis. Electronically Signed   By: Rogelia Myers M.D.   On: 11/25/2023 11:45   CT ABDOMEN PELVIS W CONTRAST Result Date: 11/25/2023 CLINICAL DATA:  Acute epigastric pain beginning this morning. EXAM: CT ABDOMEN AND PELVIS WITH CONTRAST TECHNIQUE: Multidetector CT imaging of the abdomen and pelvis was performed using the standard protocol following bolus administration of intravenous contrast. RADIATION DOSE REDUCTION: This exam was performed according to the departmental dose-optimization program which includes automated exposure control, adjustment of the mA and/or kV according to patient size and/or use of iterative reconstruction technique. CONTRAST:  OMNIPAQUE  IOHEXOL  300 MG/ML  SOLN COMPARISON:  12/14/2020 FINDINGS: Lower Chest: No acute findings. Hepatobiliary: No suspicious hepatic masses identified. Gallbladder is unremarkable. No evidence of biliary ductal dilatation. Pancreas:  No mass or inflammatory changes. Spleen: Within normal limits in size and appearance. Adrenals/Urinary Tract: Stable mild bilateral renal parenchymal scarring. No suspicious masses identified. Punctate calculus seen in midpole left kidney. No evidence of ureteral calculi or hydronephrosis. Unremarkable unopacified urinary bladder. Stomach/Bowel: Small to moderate hiatal hernia shows no significant change. No evidence of obstruction, inflammatory process or abnormal fluid collections. Vascular/Lymphatic: No pathologically enlarged lymph nodes. No acute vascular findings. Reproductive: Prior hysterectomy noted. Adnexal regions are unremarkable in appearance.  Other:  None. Musculoskeletal:  No suspicious bone lesions identified. IMPRESSION: No acute findings. Stable small to moderate hiatal hernia. Tiny left renal calculus.  No evidence of ureteral calculi or hydronephrosis. Electronically Signed   By: Norleen DELENA Kil M.D.   On: 11/25/2023 10:45      Assessment/Plan Cholelithiasis with elevated LFTs Possible acute cholecystitis  - RUQ US  8/20 with dilated CBD and mild central intrahepatic ductal dilatation and cholelithiasis, mild asymmetric gallbladder wall thickening, no sonographic Murphy sign or pericholecystic fluid - MRCP 8/21 with cholelithiasis and mild diffuse gallbladder wall thickening, no choledocholithiasis - Tbili 1.3 on admit and down to 0.7 today, Alk Phos 157 on admit and 115 today, AST/ALT were 247/113 on admit and are 88/84 today - suspect patient may have passed a gallstone, she does have some mild TTP of RUQ on exam suggestive of possible cholecystitis  - would recommend laparoscopic cholecystectomy at this time, will discuss with MD if this can be done today in setting of heparin  gtt being stopped at 1030 and last dose Xarelto  8/19 - I have explained the procedure, risks, and aftercare of Laparoscopic cholecystectomy with IOC.  Risks include but are not limited to anesthesia (MI, CVA, death, prolonged intubation and aspiration), bleeding, infection, wound problems, hernia, bile leak, injury to common bile duct/liver/intestine, possible need for subtotal cholecystectomy or open cholecystectomy, increased risk of DVT/PE and diarrhea post op. She seems to understand and agrees to proceed.   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 Eloy  HTN HLD AKI Morbid Obesity - BMI 61.2 Small to moderate hiatal hernia  Hx of SLE Hx of CVA Pre-diabetes  I reviewed ED provider  notes, hospitalist notes, last 24 h vitals and pain scores, last 48 h intake and output, last 24 h labs and trends, and last 24 h imaging results.  This care required high  level of medical decision making.   Burnard JONELLE Louder, [facility] 11/26/2023, 10:44 AM Please see Amion for pager number during day hours 7:00am-4:30pm

## 2023-11-26 NOTE — Anesthesia Preprocedure Evaluation (Signed)
 Anesthesia Evaluation  Patient identified by MRN, date of birth, ID band Patient awake    Reviewed: Allergy & Precautions, H&P , NPO status , Patient's Chart, lab work & pertinent test results  History of Anesthesia Complications (+) PONV and history of anesthetic complications  Airway Mallampati: II  TM Distance: >3 FB Neck ROM: Full    Dental no notable dental hx.    Pulmonary sleep apnea    Pulmonary exam normal breath sounds clear to auscultation       Cardiovascular hypertension, + DVT  Normal cardiovascular exam Rhythm:Regular Rate:Normal  TTE 2024: IMPRESSIONS     1. Left ventricular ejection fraction, by estimation, is 60 to 65%. The  left ventricle has normal function. The left ventricle has no regional  wall motion abnormalities. Left ventricular diastolic parameters are  indeterminate.   2. Right ventricular systolic function is normal. The right ventricular  size is normal. Tricuspid regurgitation signal is inadequate for assessing  PA pressure.   3. The mitral valve is normal in structure. No evidence of mitral valve  regurgitation. No evidence of mitral stenosis.   4. The aortic valve was not well visualized. Aortic valve regurgitation  is not visualized. No aortic stenosis is present.   5. IVC not visualized.      Neuro/Psych  Headaches PSYCHIATRIC DISORDERS Anxiety     CVA    GI/Hepatic Neg liver ROS, hiatal hernia,GERD  ,,cholecystitis   Endo/Other    Class 4 obesity  Renal/GU CRFRenal disease  negative genitourinary   Musculoskeletal  (+) Arthritis ,    Abdominal  (+) + obese  Peds negative pediatric ROS (+)  Hematology  (+) Blood dyscrasia, anemia   Anesthesia Other Findings SLE  Scleroderma  Reproductive/Obstetrics negative OB ROS                              Anesthesia Physical Anesthesia Plan  ASA: 4  Anesthesia Plan: General   Post-op Pain  Management:    Induction: Intravenous  PONV Risk Score and Plan: 4 or greater and Ondansetron , Dexamethasone  and Treatment may vary due to age or medical condition  Airway Management Planned: Oral ETT and Video Laryngoscope Planned  Additional Equipment: None  Intra-op Plan:   Post-operative Plan: Extubation in OR  Informed Consent: I have reviewed the patients History and Physical, chart, labs and discussed the procedure including the risks, benefits and alternatives for the proposed anesthesia with the patient or authorized representative who has indicated his/her understanding and acceptance.     Dental advisory given  Plan Discussed with: CRNA  Anesthesia Plan Comments:          Anesthesia Quick Evaluation

## 2023-11-26 NOTE — Progress Notes (Signed)
 PROGRESS NOTE  [name]  FMW:[id number] DOB: [birth year] DOA: 11/25/2023 PCP: Donah Laymon PARAS, MD   Brief Narrative: Patient is a 58 year old female with history of obesity, SLE, multiple DVTs on Xarelto  who presented with abdominal pain and found to have choledocholithiasis at outside hospital.  Also reported nausea, vomiting, no fever.  On presentation, she was hemodynamically stable.  Lab work showed creatinine of 1.2, AST of 247, ALT 133, T. bili of 1.3.  CT abdomen/pelvis with contrast did not show any acute findings.  Ultrasound of the abdomen showed Mild central intrahepatic biliary ductal dilation with commensurate common bile duct dilation, worrisome for downstream obstruction, possibly from choledocholithiasis, cholecystolithiasis.  GI consulted ,MRCP done.  MRCP showed cholelithiasis, acute cholecystitis is not excluded, no signs of choledocholithiasis.  General surgery consulted today.  Assessment & Plan:  Principal Problem:   Choledocholithiasis   Elevated liver enzymes/choledocholithiasis: Presented with abdominal pain, nausea, vomiting.  These symptoms have improved.  CT imaging did not show any acute findings.  The ultrasound of the abdomen showed intrahepatic biliary ductal dilatation suspicious for choledocholithiasis.  MRCP showed cholelithiasis, acute cholecystitis not excluded, gallbladder wall thickening.  No signs of choledocholithiasis.  General surgery consulted for consideration of cholecystectomy  History of multiple DVTs: On Xarelto .  Currently on hold.  Currently on heparin  drip  Hypertension: On amlodipine  at home, also on olmesartan .  Blood pressure stable  AKI: Resolved  Morbid obesity: Weight is 161.8 kg          DVT prophylaxis:SCDs Start: 11/25/23 1849     Code Status: Full Code  Family Communication: None at bedside  Patient status:Inpatient  Patient is from :home  Anticipated discharge un:ynfz  Estimated DC date:after full  work up   Consultants: GI  Procedures:MRCP  Antimicrobials:  Anti-infectives (From admission, onward)    None       Subjective: Patient seen and examined at bedside today.  Hemodynamically stable.  Lying comfortably in bed.  No abdomen, nausea or vomiting today.  Was waiting for MRCP report.  Objective: Vitals:   11/25/23 2100 11/25/23 2203 11/26/23 0235 11/26/23 0547  BP:  129/61 129/66 137/65  Pulse:  79 80 78  Resp:  18 17 18   Temp:  98.5 F (36.9 C) 98.4 F (36.9 C) 98.4 F (36.9 C)  TempSrc:  Oral Oral Oral  SpO2: 100% 95% 93% 93%    Intake/Output Summary (Last 24 hours) at 11/26/2023 0745 Last data filed at 11/25/2023 2300 Gross per 24 hour  Intake 1031.53 ml  Output --  Net 1031.53 ml   There were no vitals filed for this visit.  Examination:  General exam: Overall comfortable, not in distress, morbidly obese HEENT: PERRL Respiratory system:  no wheezes or crackles  Cardiovascular system: S1 & S2 heard, RRR.  Gastrointestinal system: Abdomen is nondistended, soft and nontender.  Bowel sounds present Central nervous system: Alert and oriented Extremities: No edema, no clubbing ,no cyanosis Skin: No rashes, no ulcers,no icterus     Data Reviewed: I have personally reviewed following labs and imaging studies  CBC: Recent Labs  Lab 11/25/23 0923 11/25/23 1404 11/26/23 0433  WBC 7.2  --  6.8  NEUTROABS 5.5  --   --   HGB 12.6 11.9* 11.8*  HCT 39.2 35.0* 37.5  MCV 89.5  --  92.6  PLT 197  --  183   Basic Metabolic Panel: Recent Labs  Lab 11/25/23 0923 11/25/23 1404 11/26/23 0433  NA 142 141 142  K  4.0 4.1 3.5  CL 107  --  109  CO2 22  --  23  GLUCOSE 139*  --  85  BUN 17  --  16  CREATININE 1.21*  --  0.82  CALCIUM  9.9  --  9.2     No results found for this or any previous visit (from the past 240 hours).   Radiology Studies: US  Abdomen Limited RUQ (LIVER/GB) Result Date: 11/25/2023 CLINICAL DATA:  [id number] Abdominal pain, acute,  right upper quadrant [id number] EXAM: ULTRASOUND ABDOMEN LIMITED RIGHT UPPER QUADRANT COMPARISON:  November 25, 2023, December 14, 2020 FINDINGS: Gallbladder: Multiple small shadowing gallstones. Asymmetric wall thickening along the hepatic aspect of the gallbladder. No sonographic Murphy's sign noted by sonographer. Common bile duct: Diameter: 11 mm Liver: Normal echogenicity. No focal lesion identified. Mild central intrahepatic biliary ductal dilation. Portal vein is patent on color Doppler imaging with normal direction of blood flow towards the liver. Other: None. IMPRESSION: 1. Mild central intrahepatic biliary ductal dilation with commensurate common bile duct dilation, worrisome for downstream obstruction, possibly from choledocholithiasis. Multiphase abdominal MRI with IV contrast recommended for further characterization. 2. Cholecystolithiasis. Asymmetric wall thickening along the hepatic aspect of the gallbladder, which in the absence of a sonographic Murphy's sign and pericholecystic fluid, may be related to biliary obstruction and stasis or acute hepatitis. Electronically Signed   By: Rogelia Myers M.D.   On: 11/25/2023 11:45   CT ABDOMEN PELVIS W CONTRAST Result Date: 11/25/2023 CLINICAL DATA:  Acute epigastric pain beginning this morning. EXAM: CT ABDOMEN AND PELVIS WITH CONTRAST TECHNIQUE: Multidetector CT imaging of the abdomen and pelvis was performed using the standard protocol following bolus administration of intravenous contrast. RADIATION DOSE REDUCTION: This exam was performed according to the departmental dose-optimization program which includes automated exposure control, adjustment of the mA and/or kV according to patient size and/or use of iterative reconstruction technique. CONTRAST:  OMNIPAQUE  IOHEXOL  300 MG/ML  SOLN COMPARISON:  12/14/2020 FINDINGS: Lower Chest: No acute findings. Hepatobiliary: No suspicious hepatic masses identified. Gallbladder is unremarkable. No evidence of  biliary ductal dilatation. Pancreas:  No mass or inflammatory changes. Spleen: Within normal limits in size and appearance. Adrenals/Urinary Tract: Stable mild bilateral renal parenchymal scarring. No suspicious masses identified. Punctate calculus seen in midpole left kidney. No evidence of ureteral calculi or hydronephrosis. Unremarkable unopacified urinary bladder. Stomach/Bowel: Small to moderate hiatal hernia shows no significant change. No evidence of obstruction, inflammatory process or abnormal fluid collections. Vascular/Lymphatic: No pathologically enlarged lymph nodes. No acute vascular findings. Reproductive: Prior hysterectomy noted. Adnexal regions are unremarkable in appearance. Other:  None. Musculoskeletal:  No suspicious bone lesions identified. IMPRESSION: No acute findings. Stable small to moderate hiatal hernia. Tiny left renal calculus. No evidence of ureteral calculi or hydronephrosis. Electronically Signed   By: Norleen DELENA Kil M.D.   On: 11/25/2023 10:45    Scheduled Meds:  amLODipine   5 mg Oral Daily   arformoterol   15 mcg Nebulization BID   And   umeclidinium bromide   1 puff Inhalation Daily   pantoprazole   40 mg Oral Daily   Continuous Infusions:  heparin  1,300 Units/hr (11/26/23 0730)     LOS: 1 day   Ivonne Mustache, MD [facility] P8/21/2025, 7:45 AM

## 2023-11-26 NOTE — Consult Note (Signed)
 Referring Provider: EDP Primary Care Physician:  Donah Laymon PARAS, MD Primary Gastroenterologist:  Dr. Shila  Reason for Consultation:  ? CBD stone  HPI: [name] is a 58 y.o. female with medical history significant for obesity, SLE, multiple blood clots on Xarelto  being admitted to the hospital with sudden onset of abdominal pain.  Says that she had sudden onset of RUQ abdominal pain that woke her from sleep at 3 AM on Wednesday.  Started with back pain and progressed to RUQ with nausea and 2 episodes of vomiting.    Lab Results  Component Value Date   ALT 84 (H) 11/26/2023   AST 88 (H) 11/26/2023   ALKPHOS 115 11/26/2023   BILITOT 0.7 11/26/2023    Yesterday ALT was 113, AST 247, ALP 157, and total bili 1.3.  CT scan abdomen and pelvis with contrast:  IMPRESSION: No acute findings.   Stable small to moderate hiatal hernia.   Tiny left renal calculus. No evidence of ureteral calculi or hydronephrosis.  Ultrasound:  IMPRESSION: 1. Mild central intrahepatic biliary ductal dilation with commensurate common bile duct dilation, worrisome for downstream obstruction, possibly from choledocholithiasis. Multiphase abdominal MRI with IV contrast recommended for further characterization. 2. Cholecystolithiasis. Asymmetric wall thickening along the hepatic aspect of the gallbladder, which in the absence of a sonographic Murphy's sign and pericholecystic fluid, may be related to biliary obstruction and stasis or acute hepatitis.  MRI abdomen/MRCP:  IMPRESSION: Cholelithiasis, with mild diffuse gallbladder wall thickening. Acute cholecystitis cannot be excluded. Consider nuclear medicine hepatobiliary scan for further evaluation if clinically warranted.   Mild dilatation of intrahepatic bile ducts, without no of choledocholithiasis or other obstructing etiology.   Small to moderate hiatal hernia.  Feels ok currently with no pain.  No pain medication since  last night.   Past Medical History:  Diagnosis Date   Allergy    Anemia    Anxiety    Arthritis    Aspiration pneumonia (HCC)    Blood transfusion without reported diagnosis    Cough    Cough 06/11/2018   Difficulty sleeping 06/11/2018   Diverticulitis with perforation 2010   DVT (deep vein thrombosis) in pregnancy 11/24/2016   DVT, lower extremity (HCC)    Endometrial cancer (HCC)    Enlarged lymph nodes 08/13/2017   Family history of adverse reaction to anesthesia    pts daughter had difficulty awakening following anesthesia, long time to wake up   GERD (gastroesophageal reflux disease)    Headache    Hemoptysis 04/22/2018   History of bronchitis    History of ear infections    Hospital discharge follow-up 03/02/2019   Hx of migraines    Hyperlipidemia    Hypertension    IBS (irritable bowel syndrome)    Kidney infection    Lupus (systemic lupus erythematosus) (HCC) 02/2017   Morbid obesity (HCC)    Neck pain 01/13/2019   Ovarian cancer (HCC) dx'd 01/2015   PONV (postoperative nausea and vomiting)    Port-A-Cath in place    Right side   Portacath in place 09/12/2015   Pre-diabetes    Pyelonephritis    Right knee pain 11/28/2011   Scleroderma (HCC)    Screen for colon cancer 03/02/2019   Slow rate of speech 08/22/2015   Chronic from CVA.  She also has loss of left nasolabial fold and slight left facial droop/small asymmetry on the left side secondary to CVA   UTI (urinary tract infection) 05/07/2018    Past  Surgical History:  Procedure Laterality Date   ABDOMINAL HYSTERECTOMY N/A 03/13/2015   Procedure: TOTAL HYSTERECTOMY ABDOMINAL BILATERAL SALPINGO OOPHORECTOMY RADICAL TUMOR DEBUKING;  Surgeon: Maurilio Ship, MD;  Location: [facility];  Service: Gynecology;  Laterality: N/A;   BOWEL RESECTION  03/13/2015   Procedure: SMALL BOWEL RESECTION;  Surgeon: Maurilio Ship, MD;  Location: [facility];  Service: Gynecology;;   CESAREAN SECTION  1983 and 1984   COLONOSCOPY WITH  PROPOFOL  N/A 07/19/2020   Procedure: COLONOSCOPY WITH PROPOFOL ;  Surgeon: Legrand Victory LITTIE DOUGLAS, MD;  Location: [facility];  Service: Gastroenterology;  Laterality: N/A;   COLOSTOMY  04/07/2006   COLOSTOMY TAKEDOWN     ESOPHAGOGASTRODUODENOSCOPY (EGD) WITH PROPOFOL  N/A 12/21/2018   Procedure: ESOPHAGOGASTRODUODENOSCOPY (EGD) WITH PROPOFOL ;  Surgeon: Legrand Victory LITTIE DOUGLAS, MD;  Location: [facility];  Service: Gastroenterology;  Laterality: N/A;   HEMOSTASIS CLIP PLACEMENT  07/19/2020   Procedure: HEMOSTASIS CLIP PLACEMENT;  Surgeon: Legrand Victory LITTIE DOUGLAS, MD;  Location: [facility];  Service: Gastroenterology;;   IR REMOVAL TUN ACCESS W/ PORT W/O FL MOD SED  12/17/2022   LAPAROTOMY N/A 03/13/2015   Procedure: EXPLORATORY LAPAROTOMY;  Surgeon: Maurilio Ship, MD;  Location: [facility];  Service: Gynecology;  Laterality: N/A;   POLYPECTOMY  07/19/2020   Procedure: POLYPECTOMY;  Surgeon: Legrand Victory LITTIE DOUGLAS, MD;  Location: [facility];  Service: Gastroenterology;;   TONSILLECTOMY AND ADENOIDECTOMY  04/08/1995   VENTRAL HERNIA REPAIR  03/13/2015   Procedure: HERNIA REPAIR VENTRAL ADULT;  Surgeon: Maurilio Ship, MD;  Location: [facility];  Service: Gynecology;;    Prior to Admission medications   Medication Sig Start Date End Date Taking? Authorizing Provider  acetaminophen  (TYLENOL ) 500 MG tablet Take 2 tablets (1,000 mg total) by mouth every 6 (six) hours as needed for moderate pain. 03/14/21  Yes Rosendo Rush, MD  albuterol  (PROVENTIL ) (2.5 MG/3ML) 0.083% nebulizer solution Take 3 mLs (2.5 mg total) by nebulization every 6 (six) hours as needed for wheezing or shortness of breath. 08/21/22  Yes Alba Sharper, MD  albuterol  (VENTOLIN  HFA) 108 (90 Base) MCG/ACT inhaler INHALE 2 PUFFS INTO THE LUNGS EVERY 6 HOURS AS NEEDED FOR WHEEZING OR SHORTNESS OF BREATH 04/02/23  Yes Madelon Donald HERO, DO  amLODipine -olmesartan  (AZOR ) 5-20 MG tablet Take 1 tablet by mouth daily. 08/24/23  Yes Acharya, Gayatri A, MD  baclofen   (LIORESAL ) 10 MG tablet Take 1 tablet (10 mg total) by mouth 2 (two) times daily as needed for muscle spasms. 07/15/23  Yes Donah Laymon PARAS, MD  cetirizine  (ZYRTEC ) 10 MG tablet Take 10 mg by mouth as needed for allergies or rhinitis.   Yes [provider]  Cholecalciferol (VITAMIN D3) 50 MCG (2000 UT) TABS Take 2,000 Units by mouth daily.   Yes [provider]  famotidine  (PEPCID ) 40 MG tablet Take 1 tablet (40 mg total) by mouth at bedtime. Patient taking differently: Take 40 mg by mouth at bedtime as needed for heartburn or indigestion. 10/29/23  Yes Natavia Sublette D, PA-C  ferrous sulfate  325 (65 FE) MG EC tablet Take 1 tablet (325 mg total) by mouth daily with breakfast. Patient taking differently: Take 325 mg by mouth every other day. 01/19/23  Yes Kennedy-Smith, Colleen M, NP  fesoterodine  (TOVIAZ ) 8 MG TB24 tablet Take 1 tablet (8 mg total) by mouth daily. Patient taking differently: Take 8 mg by mouth at bedtime. 11/12/22  Yes Donah Laymon PARAS, MD  fluticasone  (FLONASE ) 50 MCG/ACT nasal spray Place 2 sprays into both nostrils in the  morning and at bedtime. Patient taking differently: Place 2 sprays into both nostrils 2 (two) times daily as needed for allergies or rhinitis. 07/22/22  Yes Theophilus Pagan, MD  folic acid  (FOLVITE ) 1 MG tablet Take 1 tablet (1 mg total) by mouth daily. 02/16/23  Yes Pray, Rollene BRAVO, MD  furosemide  (LASIX ) 20 MG tablet Take 1 tablet (20 mg total) by mouth daily as needed for edema or fluid. 09/17/22  Yes Austin Ade, MD  omeprazole  (PRILOSEC) 40 MG capsule TAKE 1 CAPSULE(40 MG) BY MOUTH IN THE MORNING AND AT BEDTIME Patient taking differently: Take 40 mg by mouth 2 (two) times daily as needed (for reflux). 11/08/23  Yes Kennedy-Smith, Colleen M, NP  ondansetron  (ZOFRAN ) 4 MG tablet Take 1 tablet (4 mg total) by mouth every 6 (six) hours as needed for nausea. 06/27/22  Yes Will Almarie MATSU, MD  OXYGEN Inhale 2 L/min into the lungs at bedtime  as needed (for shortness of breath).   Yes [provider]  Semaglutide -Weight Management (WEGOVY ) 1 MG/0.5ML SOAJ Inject 1 mg into the skin once a week. Patient taking differently: Inject 1 mg into the skin every Monday. 10/23/23  Yes Donah Laymon PARAS, MD  Tiotropium Bromide-Olodaterol (STIOLTO RESPIMAT ) 2.5-2.5 MCG/ACT AERS Inhale 2 puffs into the lungs daily. 04/29/23  Yes Hunsucker, Donnice SAUNDERS, MD  traMADol  (ULTRAM ) 50 MG tablet Take 50 mg by mouth daily as needed (for pain).   Yes [provider]  triamcinolone  cream (KENALOG ) 0.1 % Apply 1 Application topically 2 (two) times daily as needed (Skin irritation.). 08/06/23  Yes Donah Laymon PARAS, MD  XARELTO  20 MG TABS tablet TAKE 1 TABLET(20 MG) BY MOUTH IN THE MORNING 11/13/23  Yes Donah Laymon PARAS, MD  diclofenac  Sodium (VOLTAREN ) 1 % GEL Apply to knee once daily as needed Patient not taking: Reported on 11/25/2023 05/26/22   Ganta, Anupa, DO  dicyclomine  (BENTYL ) 10 MG capsule Take 10 mg by mouth as needed. 09/11/20   [provider]  ezetimibe  (ZETIA ) 10 MG tablet Take 1 tablet (10 mg total) by mouth daily. Patient not taking: Reported on 11/25/2023 02/24/23   Madie Jon Garre, PA  valACYclovir  (VALTREX ) 500 MG tablet TAKE 1 TABLET(500 MG) BY MOUTH TWICE DAILY FOR 3 DAYS. START OF OUTBREAK Patient not taking: Reported on 11/25/2023 11/24/23   Donah Laymon PARAS, MD  Zoster Vaccine Adjuvanted (SHINGRIX ) injection Administer Shingrix  vaccination now and repeat in two months Patient not taking: Reported on 11/25/2023 05/21/23   Donah Laymon PARAS, MD    Current Facility-Administered Medications  Medication Dose Route Frequency Provider Last Rate Last Admin   albuterol  (PROVENTIL ) (2.5 MG/3ML) 0.083% nebulizer solution 2.5 mg  2.5 mg Nebulization Q2H PRN Zella, Mir M, MD       amLODipine  (NORVASC ) tablet 5 mg  5 mg Oral Daily Ikramullah, Mir M, MD   5 mg at 11/26/23 9165   arformoterol  (BROVANA ) nebulizer  solution 15 mcg  15 mcg Nebulization BID Ikramullah, Mir M, MD   15 mcg at 11/26/23 9177   And   umeclidinium bromide  (INCRUSE ELLIPTA ) 62.5 MCG/ACT 1 puff  1 puff Inhalation Daily Zella, Mir M, MD   1 puff at 11/26/23 0825   cefTRIAXone  (ROCEPHIN ) 2 g in sodium chloride  0.9 % 100 mL IVPB  2 g Intravenous Q24H Johnson, Kelly R, PA-C       heparin  ADULT infusion 100 units/mL (25000 units/250mL)  1,300 Units/hr Intravenous Continuous Zella Katha HERO, MD   Stopped at 11/26/23 1027  ibuprofen  (ADVIL ) tablet 400 mg  400 mg Oral Q6H PRN Zella, Mir M, MD       morphine  (PF) 2 MG/ML injection 2 mg  2 mg Intravenous Q3H PRN Zella, Mir M, MD       ondansetron  (ZOFRAN ) tablet 4 mg  4 mg Oral Q6H PRN Zella, Mir M, MD       Or   ondansetron  (ZOFRAN ) injection 4 mg  4 mg Intravenous Q6H PRN Zella, Mir M, MD       oxyCODONE  (Oxy IR/ROXICODONE ) immediate release tablet 5 mg  5 mg Oral Q4H PRN Zella, Mir M, MD       pantoprazole  (PROTONIX ) EC tablet 40 mg  40 mg Oral Daily Zella, Mir M, MD   40 mg at 11/26/23 9166   traZODone  (DESYREL ) tablet 25 mg  25 mg Oral QHS PRN Zella Katha HERO, MD        Allergies as of 11/25/2023 - Review Complete 11/25/2023  Allergen Reaction Noted   Tape Itching and Other (See Comments) 01/13/2023   Penicillins Rash 06/09/2011    Family History  Problem Relation Age of Onset   Diabetes Mother    Hypertension Mother    Hyperlipidemia Mother    Colon polyps Mother        4 total   Fibroids Mother        s/p hysterectomy   Hyperlipidemia Father    Lung cancer Father    Kidney failure Maternal Grandmother 38   Pancreatic cancer Paternal Grandmother        dx. >50   Diabetes Paternal Grandfather    Diabetes Daughter    Hodgkin's lymphoma Daughter 70   Asthma Maternal Aunt        severe - d. 24   Prostate cancer Maternal Uncle 30   Diabetes Paternal Aunt    Diabetes Paternal Aunt    Diabetes Paternal Uncle    Heart disease Neg Hx     Stroke Neg Hx    Colon cancer Neg Hx    Stomach cancer Neg Hx    Esophageal cancer Neg Hx    Rectal cancer Neg Hx     Social History   Socioeconomic History   Marital status: Legally Separated    Spouse name: Not on file   Number of children: Not on file   Years of education: Not on file   Highest education level: Some college, no degree  Occupational History   Not on file  Tobacco Use   Smoking status: Never   Smokeless tobacco: Never  Vaping Use   Vaping status: Never Used  Substance and Sexual Activity   Alcohol use: Yes    Comment: Maybe wine every 6 months   Drug use: No   Sexual activity: Yes    Partners: Male    Comment: Told she could not have more children in 1986  Other Topics Concern   Not on file  Social History Narrative   Works part time at [employer] (13 years as of 2015) - former Merchandiser, retail, 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   Married x 23 years, recently separated (4/15).  No domestic violence.  + financial stress.     Lives previously with husband who moved out, now with daughter who has medical problems, including Hodgkin's disease, and granddaughter (age 46).     Uses seat belt.    Social Drivers of Health   Financial Resource Strain: Low Risk  (11/15/2023)  Overall Financial Resource Strain (CARDIA)    Difficulty of Paying Living Expenses: Not very hard  Food Insecurity: No Food Insecurity (11/25/2023)   Hunger Vital Sign    Worried About Running Out of Food in the Last Year: Never true    Ran Out of Food in the Last Year: Never true  Transportation Needs: No Transportation Needs (11/25/2023)   PRAPARE - Administrator, Civil Service (Medical): No    Lack of Transportation (Non-Medical): No  Physical Activity: Sufficiently Active (11/15/2023)   Exercise Vital Sign    Days of Exercise per Week: 4 days    Minutes of Exercise per Session: 40 min  Recent Concern: Physical Activity - Insufficiently Active (08/26/2023)    Received [facility]   Exercise Vital Sign    On average, how many days per week do you engage in moderate to strenuous exercise (like a brisk walk)?: 4 days    On average, how many minutes do you engage in exercise at this level?: 30 min  Stress: No Stress Concern Present (11/15/2023)   [employer] of Occupational Health - Occupational Stress Questionnaire    Feeling of Stress: Only a little  Social Connections: Socially Integrated (11/15/2023)   Social Connection and Isolation Panel    Frequency of Communication with Friends and Family: More than three times a week    Frequency of Social Gatherings with Friends and Family: Once a week    Attends Religious Services: More than 4 times per year    Active Member of [employer] or Organizations: Yes    Attends Engineer, structural: More than 4 times per year    Marital Status: Married  Catering manager Violence: Not At Risk (11/25/2023)   Humiliation, Afraid, Rape, and Kick questionnaire    Fear of Current or Ex-Partner: No    Emotionally Abused: No    Physically Abused: No    Sexually Abused: No    Review of Systems: ROS is O/W negative except as mentioned in HPI.  Physical Exam: Vital signs in last 24 hours: Temp:  [97.5 F (36.4 C)-98.6 F (37 C)] 98.6 F (37 C) (08/21 1049) Pulse Rate:  [66-90] 80 (08/21 1049) Resp:  [15-18] 18 (08/21 1049) BP: (109-156)/(61-93) 138/67 (08/21 1049) SpO2:  [93 %-100 %] 99 % (08/21 1049) Weight:  [161.8 kg] 161.8 kg (08/21 0900) Last BM Date : 11/25/23 General:  Alert, Well-developed, well-nourished, pleasant and cooperative in NAD Head:  Normocephalic and atraumatic. Eyes:  Sclera clear, no icterus.  Conjunctiva pink. Ears:  Normal auditory acuity. Mouth:  No deformity or lesions.   Lungs:  Clear throughout to auscultation.  No wheezes, crackles, or rhonchi.  Heart:  Regular rate and rhythm; no murmurs, clicks, rubs, or gallops. Abdomen:  Soft, non-distended.  BS present.   Mild RUQ TTP.    Msk:  Symmetrical without gross deformities. Pulses:  Normal pulses noted. Extremities:  Without clubbing or edema. Neurologic:  Alert and oriented x 4;  grossly normal neurologically. Skin:  Intact without significant lesions or rashes. Psych:  Alert and cooperative. Normal mood and affect.  Intake/Output from previous day: 08/20 0701 - 08/21 0700 In: 1031.5 [I.V.:31.5; IV Piggyback:1000] Out: -   Lab Results: Recent Labs    11/25/23 0923 11/25/23 1404 11/26/23 0433  WBC 7.2  --  6.8  HGB 12.6 11.9* 11.8*  HCT 39.2 35.0* 37.5  PLT 197  --  183   BMET Recent Labs    11/25/23 0923  11/25/23 1404 11/26/23 0433  NA 142 141 142  K 4.0 4.1 3.5  CL 107  --  109  CO2 22  --  23  GLUCOSE 139*  --  85  BUN 17  --  16  CREATININE 1.21*  --  0.82  CALCIUM  9.9  --  9.2   LFT Recent Labs    11/26/23 0435  PROT 6.2*  ALBUMIN 3.2*  AST 88*  ALT 84*  ALKPHOS 115  BILITOT 0.7  BILIDIR 0.1  IBILI 0.6   Studies/Results: MR ABDOMEN MRCP W WO CONTAST Result Date: 11/26/2023 CLINICAL DATA:  Right upper quadrant pain. Cholelithiasis. Biliary ductal dilatation on recent ultrasound. EXAM: MRI ABDOMEN WITHOUT AND WITH CONTRAST (INCLUDING MRCP) TECHNIQUE: Multiplanar multisequence MR imaging of the abdomen was performed both before and after the administration of intravenous contrast. Heavily T2-weighted images of the biliary and pancreatic ducts were obtained, and three-dimensional MRCP images were rendered by post processing. CONTRAST:  10mL GADAVIST  GADOBUTROL  1 MMOL/ML IV SOLN COMPARISON:  Ultrasound and CT on 11/25/2023 FINDINGS: Lower chest: No acute findings. Hepatobiliary: No hepatic masses identified. Gallbladder is distended tiny gallstones are seen. Mild diffuse gallbladder wall thickening is seen, without pericholecystic inflammatory changes. These findings are equivocal for acute cholecystitis. Mild dilatation of intrahepatic bile ducts is seen, however, there  is gradual tapering of the common bile duct which is nondilated measuring 5 mm in diameter. No evidence of choledocholithiasis or biliary stricture. Pancreas: No mass or inflammatory changes. No evidence of pancreatic ductal dilatation or pancreas divisum. Spleen:  Within normal limits in size and appearance. Adrenals/Urinary Tract: No suspicious masses identified. Mild left renal parenchymal scarring noted. No evidence of hydronephrosis. Stomach/Bowel: Small to moderate hiatal hernia is seen. Otherwise unremarkable. Vascular/Lymphatic: No pathologically enlarged lymph nodes identified. No acute vascular findings. Other:  None. Musculoskeletal:  No suspicious bone lesions identified. IMPRESSION: Cholelithiasis, with mild diffuse gallbladder wall thickening. Acute cholecystitis cannot be excluded. Consider nuclear medicine hepatobiliary scan for further evaluation if clinically warranted. Mild dilatation of intrahepatic bile ducts, without no of choledocholithiasis or other obstructing etiology. Small to moderate hiatal hernia. Electronically Signed   By: Norleen DELENA Kil M.D.   On: 11/26/2023 08:49   MR 3D Recon At Scanner Result Date: 11/26/2023 CLINICAL DATA:  Right upper quadrant pain. Cholelithiasis. Biliary ductal dilatation on recent ultrasound. EXAM: MRI ABDOMEN WITHOUT AND WITH CONTRAST (INCLUDING MRCP) TECHNIQUE: Multiplanar multisequence MR imaging of the abdomen was performed both before and after the administration of intravenous contrast. Heavily T2-weighted images of the biliary and pancreatic ducts were obtained, and three-dimensional MRCP images were rendered by post processing. CONTRAST:  10mL GADAVIST  GADOBUTROL  1 MMOL/ML IV SOLN COMPARISON:  Ultrasound and CT on 11/25/2023 FINDINGS: Lower chest: No acute findings. Hepatobiliary: No hepatic masses identified. Gallbladder is distended tiny gallstones are seen. Mild diffuse gallbladder wall thickening is seen, without pericholecystic inflammatory  changes. These findings are equivocal for acute cholecystitis. Mild dilatation of intrahepatic bile ducts is seen, however, there is gradual tapering of the common bile duct which is nondilated measuring 5 mm in diameter. No evidence of choledocholithiasis or biliary stricture. Pancreas: No mass or inflammatory changes. No evidence of pancreatic ductal dilatation or pancreas divisum. Spleen:  Within normal limits in size and appearance. Adrenals/Urinary Tract: No suspicious masses identified. Mild left renal parenchymal scarring noted. No evidence of hydronephrosis. Stomach/Bowel: Small to moderate hiatal hernia is seen. Otherwise unremarkable. Vascular/Lymphatic: No pathologically enlarged lymph nodes identified. No acute vascular findings. Other:  None.  Musculoskeletal:  No suspicious bone lesions identified. IMPRESSION: Cholelithiasis, with mild diffuse gallbladder wall thickening. Acute cholecystitis cannot be excluded. Consider nuclear medicine hepatobiliary scan for further evaluation if clinically warranted. Mild dilatation of intrahepatic bile ducts, without no of choledocholithiasis or other obstructing etiology. Small to moderate hiatal hernia. Electronically Signed   By: Norleen DELENA Kil M.D.   On: 11/26/2023 08:49   US  Abdomen Limited RUQ (LIVER/GB) Result Date: 11/25/2023 CLINICAL DATA:  [id number] Abdominal pain, acute, right upper quadrant [id number] EXAM: ULTRASOUND ABDOMEN LIMITED RIGHT UPPER QUADRANT COMPARISON:  November 25, 2023, December 14, 2020 FINDINGS: Gallbladder: Multiple small shadowing gallstones. Asymmetric wall thickening along the hepatic aspect of the gallbladder. No sonographic Murphy's sign noted by sonographer. Common bile duct: Diameter: 11 mm Liver: Normal echogenicity. No focal lesion identified. Mild central intrahepatic biliary ductal dilation. Portal vein is patent on color Doppler imaging with normal direction of blood flow towards the liver. Other: None. IMPRESSION: 1. Mild central  intrahepatic biliary ductal dilation with commensurate common bile duct dilation, worrisome for downstream obstruction, possibly from choledocholithiasis. Multiphase abdominal MRI with IV contrast recommended for further characterization. 2. Cholecystolithiasis. Asymmetric wall thickening along the hepatic aspect of the gallbladder, which in the absence of a sonographic Murphy's sign and pericholecystic fluid, may be related to biliary obstruction and stasis or acute hepatitis. Electronically Signed   By: Rogelia Myers M.D.   On: 11/25/2023 11:45   CT ABDOMEN PELVIS W CONTRAST Result Date: 11/25/2023 CLINICAL DATA:  Acute epigastric pain beginning this morning. EXAM: CT ABDOMEN AND PELVIS WITH CONTRAST TECHNIQUE: Multidetector CT imaging of the abdomen and pelvis was performed using the standard protocol following bolus administration of intravenous contrast. RADIATION DOSE REDUCTION: This exam was performed according to the departmental dose-optimization program which includes automated exposure control, adjustment of the mA and/or kV according to patient size and/or use of iterative reconstruction technique. CONTRAST:  OMNIPAQUE  IOHEXOL  300 MG/ML  SOLN COMPARISON:  12/14/2020 FINDINGS: Lower Chest: No acute findings. Hepatobiliary: No suspicious hepatic masses identified. Gallbladder is unremarkable. No evidence of biliary ductal dilatation. Pancreas:  No mass or inflammatory changes. Spleen: Within normal limits in size and appearance. Adrenals/Urinary Tract: Stable mild bilateral renal parenchymal scarring. No suspicious masses identified. Punctate calculus seen in midpole left kidney. No evidence of ureteral calculi or hydronephrosis. Unremarkable unopacified urinary bladder. Stomach/Bowel: Small to moderate hiatal hernia shows no significant change. No evidence of obstruction, inflammatory process or abnormal fluid collections. Vascular/Lymphatic: No pathologically enlarged lymph nodes. No acute  vascular findings. Reproductive: Prior hysterectomy noted. Adnexal regions are unremarkable in appearance. Other:  None. Musculoskeletal:  No suspicious bone lesions identified. IMPRESSION: No acute findings. Stable small to moderate hiatal hernia. Tiny left renal calculus. No evidence of ureteral calculi or hydronephrosis. Electronically Signed   By: Norleen DELENA Kil M.D.   On: 11/25/2023 10:45    IMPRESSION:  *RUQ abdominal pain with nausea and vomiting:  Sunday onset at 3AM on 8/20.  LFTs elevated and ultrasound with question of CBD stone.  MRCP was in fact negative for CBD stone today, however.  Total bili and ALP normal. *History of blood clots on Xarelto   PLAN: -No need for ERCP. -Has already been seen by surgery with plans for cholecystectomy per the patient although I do not see any note from surgery yet.   Harlene BIRCH. Keidan Aumiller  11/26/2023, 11:09 AM

## 2023-11-26 NOTE — Progress Notes (Signed)
 Heparin  drip was stopped at 0630 for patient to go to MRI.  Oncoming nurse is aware to notify pharmacy upon patients arrival from MRI and to restart per pharmacy.

## 2023-11-26 NOTE — Anesthesia Postprocedure Evaluation (Signed)
 Anesthesia Post Note  Patient: [name]  Procedure(s) Performed: LAPAROSCOPIC CHOLECYSTECTOMY (Abdomen) LYSIS, ADHESIONS, LAPAROSCOPIC (Abdomen)     Patient location during evaluation: PACU Anesthesia Type: General Level of consciousness: awake and alert Pain management: pain level controlled Vital Signs Assessment: post-procedure vital signs reviewed and stable Respiratory status: spontaneous breathing, nonlabored ventilation, respiratory function stable and patient connected to nasal cannula oxygen Cardiovascular status: blood pressure returned to baseline and stable Postop Assessment: no apparent nausea or vomiting Anesthetic complications: no   No notable events documented.  Last Vitals:  Vitals:   11/26/23 1645 11/26/23 1707  BP: (!) 159/71 (!) 163/65  Pulse: 84 81  Resp: 18 16  Temp: 36.6 C (!) 36.4 C  SpO2: 91% 98%    Last Pain:  Vitals:   11/26/23 1714  TempSrc:   PainSc: 10-Worst pain ever                 Atziri Zubiate L Albertus Chiarelli

## 2023-11-26 NOTE — Op Note (Signed)
 [name] [id number] 22-Jun-1965 11/26/2023  Laparoscopic Cholecystectomy with near infrared fluorescent cholangiography with laparoscopic lysis of adhesions for 30 minutes procedure Note  Indications: This patient presents with symptomatic gallbladder disease and will undergo laparoscopic cholecystectomy.  The patient had had a prior colectomy with colostomy and subsequent reversal surgery.  She had had exploratory laparotomy with lysis of adhesions and total abdominal hysterectomy in 2016.  Pre-operative Diagnosis: Acute calculus cholecystitis  Post-operative Diagnosis: Acute calculus cholecystitis, intra-abdominal adhesions  Surgeon: Camellia Blush MD FACS  Assistants: Starleen Barrier, MD, PGY 4  Anesthesia: General endotracheal anesthesia   Procedure Details  The patient was seen again in the Holding Room. The risks, benefits, complications, treatment options, and expected outcomes were discussed with the patient. The possibilities of reaction to medication, pulmonary aspiration, perforation of viscus, bleeding, recurrent infection, finding a normal gallbladder, the need for additional procedures, failure to diagnose a condition, the possible need to convert to an open procedure, and creating a complication requiring transfusion or operation were discussed with the patient. The likelihood of improving the patient's symptoms with return to their baseline status is good.  The patient and/or family concurred with the proposed plan, giving informed consent. The site of surgery properly noted. The patient was taken to Operating Room, identified as [name] and the procedure verified as Laparoscopic Cholecystectomy with ICG dye.  A Time Out was held and the above information confirmed. Antibiotic prophylaxis was administered.    ICG dye was administered preoperatively.    General endotracheal anesthesia was then administered and tolerated well. After the induction, the abdomen was  prepped with Chloraprep and draped in the sterile fashion. The patient was positioned in the supine position.  Access to the abdomen was obtained via the Optiview technique.  Since she had an old left sided colostomy incision I decided to gain entry in the right side of the abdomen.  A small incision was made on the right in the midclavicular line just below the subcostal margin.  Then using a 0 degree 5 mm laparoscope through a 5 mm trocar the laparoscope was advanced through all layers of the abdominal wall and carefully entered the abdominal cavity.  Pneumoperitoneum was smoothly established up to a patient pressure of 15 mmHg without any change in patient vital signs.  The laparoscope was advanced in the abdominal cavity was surveilled.  There is no evidence of injury to surrounding structures.  There is obvious adhesions to the anterior abdominal wall.  We visualized the liver edge and a distended edematous gallbladder.  There was a window to put a 5 mm trocar in the right lateral abdominal wall which was done under direct visualization.  I then put the camera in that trocar and looked at the upper abdomen.  There was transverse colon tethered to the anterior abdominal wall in the upper midline.  I was able to gently sweep this down with a combination of blunt dissection along with EndoShears without electrocautery.  This allowed me to place a 5 mm trocar in the left upper quadrant.  I then put the camera in the left upper trocar.  We positioned the patient in reverse Trendelenburg, tilted slightly to the patient's left. The patient had extensive adhesions between the right lobe of the liver and the peritoneum underneath the rib cage.  This was preventing us  from grabbing the gallbladder and lifting it upward.  The patient had some fatty liver.  Using EndoShears with out electrocautery and at times hook electrocautery  the adhesions between the right lobe of the liver and the peritoneum underneath the right  rib cage were taken down.  She also had some adhesions anchoring the tip of the right lateral lobe these were taken down with hook electrocautery ensuring that it was not near her colon.  The gallbladder was distended and edematous.  In order to facilitate retraction I aspirated the gallbladder.  I then replaced the left upper quadrant trocar with a 12 mm trocar and was able to place a 5 mm trocar in the midline just above where the transverse colon had been anchored to the anterior abdominal wall.  the fundus was grasped and retracted cephalad. Adhesions were lysed bluntly and with the electrocautery where indicated, taking care not to injure any adjacent organs or viscus. The infundibulum was grasped and retracted laterally, exposing the peritoneum overlying the triangle of Calot. This was then divided and exposed in a blunt fashion. A critical view of the cystic duct and cystic artery was obtained.  The cystic duct was clearly identified and bluntly dissected circumferentially.  Utilizing the Stryker camera system near infrared fluorescent activity was visualized in the liver, cystic duct, common hepatic duct and common bile duct and small bowel.  This served as a secondary confirmation of our anatomy.  I did not do a formal fluoroscopic intraoperative cholangiogram because the liver was fatty and it was obscuring some of this area but we could clearly identify the cystic duct, cystic artery, the common hepatic duct common bile duct using near infrared fluorescent cholangiography.  Patient had a small branch of the cystic artery and a little bit larger posterior branch going into the gallbladder.  we achieved a very large critical view because I lifted the gallbladder up off the cystic plate  The cystic duct was then ligated with three clips on the patient's side and divided. The cystic artery (both an anterior and posterior branch) which had been identified & dissected free was ligated with clips and divided  as well.   The gallbladder was dissected from the liver bed in retrograde fashion with the electrocautery.  The liver bed was irrigated and inspected. Hemostasis was achieved with the electrocautery. Copious irrigation was utilized and was repeatedly aspirated until clear.  We checked the surface of the liver where we had taken down the adhesions and there was no evidence of bleeding.  Did place a piece of Surgicel snow in the gallbladder fossa since the patient will need to be anticoagulated due to her history of DVT and also placed Arista hemostatic powder on top of the right lobe of the liver.  The gallbladder was removed and placed in an Ecco sac.  The gallbladder and Ecco sac were then removed through the left upper quadrant port site. We again inspected the right upper quadrant for hemostasis.  The left upper quadrant port site was closed with four 0 Vicryl using a PMI suture passer with laparoscopic guidance.  The left upper quadrant closure was inspected and there was no air leak and nothing trapped within the closure. Pneumoperitoneum was released as we removed the trocars.  4-0 Monocryl was used to close the skin.   Dermabond was applied. The patient was then extubated and brought to the recovery room in stable condition. Instrument, sponge, and needle counts were correct at closure and at the conclusion of the case.   Findings: Cholecystitis with Cholelithiasis Near infrared fluorescent cholangiography visualized in the common hepatic duct, bile duct, cystic duct, small bowel Positive  surgical snow Positive Arista hemostatic powder  Estimated Blood Loss: Minimal         Drains: none         Specimens: Gallbladder           Complications: None; patient tolerated the procedure well.         Disposition: PACU - hemodynamically stable.         Condition: stable  I was personally present& scrubbed during the entire procedure and performed all portions of the procedure with the exception  of the resident helping me close skin .  Camellia HERO. Tanda, MD, FACS General, Bariatric, & Minimally Invasive Surgery [facility],  A [facility]

## 2023-11-26 NOTE — Progress Notes (Signed)
 PHARMACY - ANTICOAGULATION CONSULT NOTE  Pharmacy Consult for Heparin  Indication: Hx PE, holding Xarelto   Allergies  Allergen Reactions   Tape Itching and Other (See Comments)    Surgical tape - NO TEGADERM!!!! It pulls OFF the skin!!   Penicillins Rash    Has patient had a PCN reaction causing immediate rash, facial/tongue/throat swelling, SOB or lightheadedness with hypotension: no Has patient had a PCN reaction causing severe rash involving mucus membranes or skin necrosis: no Has patient had a PCN reaction that required hospitalization in the hospital at the time Has patient had a PCN reaction occurring within the last 10 years: no If all of the above answers are NO, then may proceed with Cephalosporin use.     Patient Measurements:   Last documented weight 162 kg Heparin  dosing weight 96 kg  Vital Signs: Temp: 98.4 F (36.9 C) (08/21 0547) Temp Source: Oral (08/21 0547) BP: 137/65 (08/21 0547) Pulse Rate: 78 (08/21 0547)  Labs: Recent Labs    11/25/23 0923 11/25/23 1404 11/26/23 0433 11/26/23 0435  HGB 12.6 11.9* 11.8*  --   HCT 39.2 35.0* 37.5  --   PLT 197  --  183  --   APTT  --   --   --  120*  HEPARINUNFRC  --   --   --  >1.10*  CREATININE 1.21*  --  0.82  --     Estimated Creatinine Clearance: 115.1 mL/min (by C-G formula based on SCr of 0.82 mg/dL).   Medical History: Past Medical History:  Diagnosis Date   Allergy    Anemia    Anxiety    Arthritis    Aspiration pneumonia (HCC)    Blood transfusion without reported diagnosis    Cough    Cough 06/11/2018   Difficulty sleeping 06/11/2018   Diverticulitis with perforation 2010   DVT (deep vein thrombosis) in pregnancy 11/24/2016   DVT, lower extremity (HCC)    Endometrial cancer (HCC)    Enlarged lymph nodes 08/13/2017   Family history of adverse reaction to anesthesia    pts daughter had difficulty awakening following anesthesia, long time to wake up   GERD (gastroesophageal reflux  disease)    Headache    Hemoptysis 04/22/2018   History of bronchitis    History of ear infections    Hospital discharge follow-up 03/02/2019   Hx of migraines    Hyperlipidemia    Hypertension    IBS (irritable bowel syndrome)    Kidney infection    Lupus (systemic lupus erythematosus) (HCC) 02/2017   Morbid obesity (HCC)    Neck pain 01/13/2019   Ovarian cancer (HCC) dx'd 01/2015   PONV (postoperative nausea and vomiting)    Port-A-Cath in place    Right side   Portacath in place 09/12/2015   Pre-diabetes    Pyelonephritis    Right knee pain 11/28/2011   Scleroderma (HCC)    Screen for colon cancer 03/02/2019   Slow rate of speech 08/22/2015   Chronic from CVA.  She also has loss of left nasolabial fold and slight left facial droop/small asymmetry on the left side secondary to CVA   UTI (urinary tract infection) 05/07/2018    Medications:  Scheduled:   amLODipine   5 mg Oral Daily   arformoterol   15 mcg Nebulization BID   And   umeclidinium bromide   1 puff Inhalation Daily   LORazepam   1 mg Oral Once   pantoprazole   40 mg Oral Daily   Infusions:  heparin  1,500 Units/hr (11/25/23 2053)    Assessment: 71 yoF presents to ED with abdominal pain, vomiting, found to have evidence of choledocholithiasis.  PMH significnat for SLE, hx PE on Xarelto .  Pharmacy is consulted to dose heparin  while home Xarelto  is held.  Patient reports taking her last dose of Xarelto  8/19 10:30am.   SCr 1.21 CBC: Hgb and Plt WNL   11/26/2023 aPTT 120 supra-therapeutic on 1500 units/hr HL elevated as expected with xarelto  on board Hgb 11.8, plts 183 No bleeding noted   Goal of Therapy:  Heparin  level 0.3-0.7 units/ml aPTT 66-102 seconds Monitor platelets by anticoagulation protocol: Yes   Plan:  Decrease heparin  drip to 1300 units/hr  aPTT in 6 hours  Daily aPTT, heparin  level, and CBC  Leeroy Mace RPh 11/26/2023, 6:17 AM

## 2023-11-26 NOTE — Progress Notes (Signed)
 PHARMACY - ANTICOAGULATION CONSULT NOTE  Pharmacy Consult for Heparin  Indication: Hx PE, holding Xarelto   Allergies  Allergen Reactions   Tape Itching and Other (See Comments)    NO TEGADERM!!!! It pulls OFF the skin!! Surgical tape is Ok per patient.    Penicillins Rash    Has patient had a PCN reaction causing immediate rash, facial/tongue/throat swelling, SOB or lightheadedness with hypotension: no Has patient had a PCN reaction causing severe rash involving mucus membranes or skin necrosis: no Has patient had a PCN reaction that required hospitalization in the hospital at the time Has patient had a PCN reaction occurring within the last 10 years: no If all of the above answers are NO, then may proceed with Cephalosporin use.     Patient Measurements: Height: 5' 4 (162.6 cm) Weight: (!) 161.8 kg (356 lb 9.6 oz) IBW/kg (Calculated) : 54.7 HEPARIN  DW (KG): 96.4 Last documented weight 162 kg Heparin  dosing weight 96 kg  Vital Signs: Temp: 98.6 F (37 C) (08/21 1049) Temp Source: Oral (08/21 1049) BP: 138/67 (08/21 1049) Pulse Rate: 80 (08/21 1049)  Labs: Recent Labs    11/25/23 0923 11/25/23 1404 11/26/23 0433 11/26/23 0435  HGB 12.6 11.9* 11.8*  --   HCT 39.2 35.0* 37.5  --   PLT 197  --  183  --   APTT  --   --   --  120*  HEPARINUNFRC  --   --   --  >1.10*  CREATININE 1.21*  --  0.82  --     Estimated Creatinine Clearance: 115.1 mL/min (by C-G formula based on SCr of 0.82 mg/dL).   Medical History: Past Medical History:  Diagnosis Date   Allergy    Anemia    Anxiety    Arthritis    Aspiration pneumonia (HCC)    Blood transfusion without reported diagnosis    Cough    Cough 06/11/2018   Difficulty sleeping 06/11/2018   Diverticulitis with perforation 2010   DVT (deep vein thrombosis) in pregnancy 11/24/2016   DVT, lower extremity (HCC)    Endometrial cancer (HCC)    Enlarged lymph nodes 08/13/2017   Family history of adverse reaction to  anesthesia    pts daughter had difficulty awakening following anesthesia, long time to wake up   GERD (gastroesophageal reflux disease)    Headache    Hemoptysis 04/22/2018   History of bronchitis    History of ear infections    Hospital discharge follow-up 03/02/2019   Hx of migraines    Hyperlipidemia    Hypertension    IBS (irritable bowel syndrome)    Kidney infection    Lupus (systemic lupus erythematosus) (HCC) 02/2017   Morbid obesity (HCC)    Neck pain 01/13/2019   Ovarian cancer (HCC) dx'd 01/2015   PONV (postoperative nausea and vomiting)    Port-A-Cath in place    Right side   Portacath in place 09/12/2015   Pre-diabetes    Pyelonephritis    Right knee pain 11/28/2011   Scleroderma (HCC)    Screen for colon cancer 03/02/2019   Slow rate of speech 08/22/2015   Chronic from CVA.  She also has loss of left nasolabial fold and slight left facial droop/small asymmetry on the left side secondary to CVA   UTI (urinary tract infection) 05/07/2018    Medications:  Scheduled:   [MAR Hold] acetaminophen   1,000 mg Oral Q6H   [MAR Hold] amLODipine   5 mg Oral Daily   [MAR Hold] arformoterol   15 mcg Nebulization BID   And   [MAR Hold] umeclidinium bromide   1 puff Inhalation Daily   [MAR Hold] pantoprazole   40 mg Oral Daily   Infusions:   [MAR Hold] cefTRIAXone  (ROCEPHIN )  IV     heparin  Stopped (11/26/23 1027)   lactated ringers  100 mL/hr at 11/26/23 1401    Assessment: 57 yoF presents to ED with abdominal pain, vomiting, found to have evidence of choledocholithiasis.  PMH significnat for SLE, hx PE on Xarelto .  Pharmacy is consulted to dose heparin  while home Xarelto  is held.  Patient reports taking her last dose of Xarelto  8/19 10:30am.   CBC: Hgb and Plt WNL   11/26/2023 -aPTT this morning supra-therapeutic and heparin  reduced to 1300 units/hr and subsequently held for OR -Pt s/p OR; per surgeon, ok to resume heparin  at midnight with no bolus   Goal of Therapy:   Heparin  level 0.3-0.7 units/ml aPTT 66-102 seconds Monitor platelets by anticoagulation protocol: Yes   Plan:  -Resume heparin  infusion tonight at midnight at previous rate of 1300 units/hr -Check aPTT approximately 6 hours after infusion resumed -Daily aPTT, heparin  level, and CBC -Follow up for transition back to Xarelto  as appropriate   Stefano MARLA Bologna, PharmD, BCPS Clinical Pharmacist 11/26/2023 3:54 PM

## 2023-11-26 NOTE — Anesthesia Procedure Notes (Signed)
 Procedure Name: Intubation Date/Time: 11/26/2023 2:15 PM  Performed by: [place], Corean BROCKS, CRNAPre-anesthesia Checklist: Patient identified, Emergency Drugs available, Suction available and Patient being monitored Patient Re-evaluated:Patient Re-evaluated prior to induction Oxygen Delivery Method: Circle system utilized Preoxygenation: Pre-oxygenation with 100% oxygen Induction Type: IV induction Ventilation: Mask ventilation without difficulty Laryngoscope Size: Mac and 3 Grade View: Grade I Tube type: Oral Tube size: 7.0 mm Number of attempts: 1 Airway Equipment and Method: Stylet and Oral airway Placement Confirmation: ETT inserted through vocal cords under direct vision, positive ETCO2 and breath sounds checked- equal and bilateral Secured at: 22 cm Tube secured with: Tape Dental Injury: Teeth and Oropharynx as per pre-operative assessment

## 2023-11-27 ENCOUNTER — Encounter (HOSPITAL_COMMUNITY): Payer: Self-pay | Admitting: General Surgery

## 2023-11-27 DIAGNOSIS — K8050 Calculus of bile duct without cholangitis or cholecystitis without obstruction: Secondary | ICD-10-CM | POA: Diagnosis not present

## 2023-11-27 DIAGNOSIS — K8010 Calculus of gallbladder with chronic cholecystitis without obstruction: Secondary | ICD-10-CM | POA: Diagnosis not present

## 2023-11-27 LAB — CBC
HCT: 36.6 % (ref 36.0–46.0)
Hemoglobin: 11.4 g/dL — ABNORMAL LOW (ref 12.0–15.0)
MCH: 29 pg (ref 26.0–34.0)
MCHC: 31.1 g/dL (ref 30.0–36.0)
MCV: 93.1 fL (ref 80.0–100.0)
Platelets: 192 K/uL (ref 150–400)
RBC: 3.93 MIL/uL (ref 3.87–5.11)
RDW: 14.5 % (ref 11.5–15.5)
WBC: 8.8 K/uL (ref 4.0–10.5)
nRBC: 0 % (ref 0.0–0.2)

## 2023-11-27 LAB — APTT: aPTT: 68 s — ABNORMAL HIGH (ref 24–36)

## 2023-11-27 LAB — HEPARIN LEVEL (UNFRACTIONATED): Heparin Unfractionated: 0.64 [IU]/mL (ref 0.30–0.70)

## 2023-11-27 LAB — COMPREHENSIVE METABOLIC PANEL WITH GFR
ALT: 76 U/L — ABNORMAL HIGH (ref 0–44)
AST: 69 U/L — ABNORMAL HIGH (ref 15–41)
Albumin: 3 g/dL — ABNORMAL LOW (ref 3.5–5.0)
Alkaline Phosphatase: 98 U/L (ref 38–126)
Anion gap: 9 (ref 5–15)
BUN: 14 mg/dL (ref 6–20)
CO2: 22 mmol/L (ref 22–32)
Calcium: 8.7 mg/dL — ABNORMAL LOW (ref 8.9–10.3)
Chloride: 105 mmol/L (ref 98–111)
Creatinine, Ser: 1.14 mg/dL — ABNORMAL HIGH (ref 0.44–1.00)
GFR, Estimated: 56 mL/min — ABNORMAL LOW (ref >60)
Glucose, Bld: 131 mg/dL — ABNORMAL HIGH (ref 70–99)
Potassium: 3.8 mmol/L (ref 3.5–5.1)
Sodium: 136 mmol/L (ref 135–145)
Total Bilirubin: 0.5 mg/dL (ref 0.0–1.2)
Total Protein: 6.3 g/dL — ABNORMAL LOW (ref 6.5–8.1)

## 2023-11-27 MED ORDER — AMLODIPINE BESYLATE 5 MG PO TABS
5.0000 mg | ORAL_TABLET | Freq: Every day | ORAL | Status: DC
  Administered 2023-11-27: 5 mg via ORAL
  Filled 2023-11-27: qty 1

## 2023-11-27 MED ORDER — AMLODIPINE-OLMESARTAN 5-20 MG PO TABS: 1.0000 | ORAL_TABLET | Freq: Every day | ORAL | Status: DC

## 2023-11-27 MED ORDER — OXYCODONE HCL 5 MG PO TABS: 5.0000 mg | ORAL_TABLET | ORAL | 0 refills | Status: DC | PRN

## 2023-11-27 MED ORDER — RIVAROXABAN 10 MG PO TABS
20.0000 mg | ORAL_TABLET | Freq: Every day | ORAL | Status: DC
  Administered 2023-11-27: 20 mg via ORAL
  Filled 2023-11-27: qty 2

## 2023-11-27 MED ORDER — IRBESARTAN 150 MG PO TABS
150.0000 mg | ORAL_TABLET | Freq: Every day | ORAL | Status: DC
  Administered 2023-11-27: 150 mg via ORAL
  Filled 2023-11-27: qty 1

## 2023-11-27 NOTE — Plan of Care (Signed)
  Problem: Education: Goal: Knowledge of General Education information will improve Description: Including pain rating scale, medication(s)/side effects and non-pharmacologic comfort measures Outcome: Progressing   Problem: Health Behavior/Discharge Planning: Goal: Ability to manage health-related needs will improve Outcome: Progressing   Problem: Clinical Measurements: Goal: Ability to maintain clinical measurements within normal limits will improve Outcome: Progressing   Problem: Nutrition: Goal: Adequate nutrition will be maintained Outcome: Progressing   Problem: Elimination: Goal: Will not experience complications related to bowel motility Outcome: Progressing   Problem: Pain Managment: Goal: General experience of comfort will improve and/or be controlled Outcome: Progressing

## 2023-11-27 NOTE — Care Management Obs Status (Signed)
 MEDICARE OBSERVATION STATUS NOTIFICATION   Patient Details  Name: IPEK WESTRA MRN: [id number] Date of Birth: 1965/10/12   Medicare Observation Status Notification Given:  Yes    [name], RN 11/27/2023, 10:07 AM

## 2023-11-27 NOTE — Progress Notes (Signed)
 1 Day Post-Op  Subjective: Patient feels well today.  Pain is well controlled and minimal.  Up ambulating around the unit.  Tolerating regular diet.  ROS: See above, otherwise other systems negative  Objective: Vital signs in last 24 hours: Temp:  [96.9 F (36.1 C)-98.6 F (37 C)] 98 F (36.7 C) (08/22 0845) Pulse Rate:  [76-90] 90 (08/22 0845) Resp:  [12-22] 16 (08/22 0845) BP: (138-163)/(65-93) 138/83 (08/22 0845) SpO2:  [91 %-100 %] 92 % (08/22 1009) Last BM Date : 11/27/23  Intake/Output from previous day: 08/21 0701 - 08/22 0700 In: 1443.3 [P.O.:180; I.V.:1163.3; IV Piggyback:100] Out: 25 [Blood:25] Intake/Output this shift: Total I/O In: 360 [P.O.:360] Out: -   PE: Abd: soft, obese, incisions c/d/I, appropriately tender  Lab Results:  Recent Labs    11/26/23 0433 11/27/23 0511  WBC 6.8 8.8  HGB 11.8* 11.4*  HCT 37.5 36.6  PLT 183 192   BMET Recent Labs    11/26/23 0433 11/27/23 0511  NA 142 136  K 3.5 3.8  CL 109 105  CO2 23 22  GLUCOSE 85 131*  BUN 16 14  CREATININE 0.82 1.14*  CALCIUM  9.2 8.7*   PT/INR No results for input(s): LABPROT, INR in the last 72 hours. CMP     Component Value Date/Time   NA 136 11/27/2023 0511   NA 142 09/16/2022 0941   NA 140 01/17/2016 0928   K 3.8 11/27/2023 0511   K 4.8 01/17/2016 0928   CL 105 11/27/2023 0511   CO2 22 11/27/2023 0511   CO2 22 01/17/2016 0928   GLUCOSE 131 (H) 11/27/2023 0511   GLUCOSE 103 01/17/2016 0928   BUN 14 11/27/2023 0511   BUN 21 09/16/2022 0941   BUN 28.7 (H) 01/17/2016 0928   CREATININE 1.14 (H) 11/27/2023 0511   CREATININE 1.1 01/17/2016 0928   CALCIUM  8.7 (L) 11/27/2023 0511   CALCIUM  10.0 01/17/2016 0928   PROT 6.3 (L) 11/27/2023 0511   PROT 6.2 08/28/2020 1242   PROT 6.8 01/17/2016 0928   ALBUMIN 3.0 (L) 11/27/2023 0511   ALBUMIN 4.2 08/28/2020 1242   ALBUMIN 3.5 01/17/2016 0928   AST 69 (H) 11/27/2023 0511   AST 12 01/17/2016 0928   ALT 76 (H) 11/27/2023  0511   ALT 9 01/17/2016 0928   ALKPHOS 98 11/27/2023 0511   ALKPHOS 83 01/17/2016 0928   BILITOT 0.5 11/27/2023 0511   BILITOT 0.3 08/28/2020 1242   BILITOT 0.33 01/17/2016 0928   GFRNONAA 56 (L) 11/27/2023 0511   GFRNONAA 77 02/26/2015 0945   GFRAA 59 (L) 07/20/2019 1004   GFRAA 89 02/26/2015 0945   Lipase     Component Value Date/Time   LIPASE 32 11/25/2023 0923       Studies/Results: MR ABDOMEN MRCP W WO CONTAST Result Date: 11/26/2023 CLINICAL DATA:  Right upper quadrant pain. Cholelithiasis. Biliary ductal dilatation on recent ultrasound. EXAM: MRI ABDOMEN WITHOUT AND WITH CONTRAST (INCLUDING MRCP) TECHNIQUE: Multiplanar multisequence MR imaging of the abdomen was performed both before and after the administration of intravenous contrast. Heavily T2-weighted images of the biliary and pancreatic ducts were obtained, and three-dimensional MRCP images were rendered by post processing. CONTRAST:  10mL GADAVIST  GADOBUTROL  1 MMOL/ML IV SOLN COMPARISON:  Ultrasound and CT on 11/25/2023 FINDINGS: Lower chest: No acute findings. Hepatobiliary: No hepatic masses identified. Gallbladder is distended tiny gallstones are seen. Mild diffuse gallbladder wall thickening is seen, without pericholecystic inflammatory changes. These findings are equivocal for acute cholecystitis. Mild dilatation  of intrahepatic bile ducts is seen, however, there is gradual tapering of the common bile duct which is nondilated measuring 5 mm in diameter. No evidence of choledocholithiasis or biliary stricture. Pancreas: No mass or inflammatory changes. No evidence of pancreatic ductal dilatation or pancreas divisum. Spleen:  Within normal limits in size and appearance. Adrenals/Urinary Tract: No suspicious masses identified. Mild left renal parenchymal scarring noted. No evidence of hydronephrosis. Stomach/Bowel: Small to moderate hiatal hernia is seen. Otherwise unremarkable. Vascular/Lymphatic: No pathologically enlarged  lymph nodes identified. No acute vascular findings. Other:  None. Musculoskeletal:  No suspicious bone lesions identified. IMPRESSION: Cholelithiasis, with mild diffuse gallbladder wall thickening. Acute cholecystitis cannot be excluded. Consider nuclear medicine hepatobiliary scan for further evaluation if clinically warranted. Mild dilatation of intrahepatic bile ducts, without no of choledocholithiasis or other obstructing etiology. Small to moderate hiatal hernia. Electronically Signed   By: Norleen DELENA Kil M.D.   On: 11/26/2023 08:49   MR 3D Recon At Scanner Result Date: 11/26/2023 CLINICAL DATA:  Right upper quadrant pain. Cholelithiasis. Biliary ductal dilatation on recent ultrasound. EXAM: MRI ABDOMEN WITHOUT AND WITH CONTRAST (INCLUDING MRCP) TECHNIQUE: Multiplanar multisequence MR imaging of the abdomen was performed both before and after the administration of intravenous contrast. Heavily T2-weighted images of the biliary and pancreatic ducts were obtained, and three-dimensional MRCP images were rendered by post processing. CONTRAST:  10mL GADAVIST  GADOBUTROL  1 MMOL/ML IV SOLN COMPARISON:  Ultrasound and CT on 11/25/2023 FINDINGS: Lower chest: No acute findings. Hepatobiliary: No hepatic masses identified. Gallbladder is distended tiny gallstones are seen. Mild diffuse gallbladder wall thickening is seen, without pericholecystic inflammatory changes. These findings are equivocal for acute cholecystitis. Mild dilatation of intrahepatic bile ducts is seen, however, there is gradual tapering of the common bile duct which is nondilated measuring 5 mm in diameter. No evidence of choledocholithiasis or biliary stricture. Pancreas: No mass or inflammatory changes. No evidence of pancreatic ductal dilatation or pancreas divisum. Spleen:  Within normal limits in size and appearance. Adrenals/Urinary Tract: No suspicious masses identified. Mild left renal parenchymal scarring noted. No evidence of hydronephrosis.  Stomach/Bowel: Small to moderate hiatal hernia is seen. Otherwise unremarkable. Vascular/Lymphatic: No pathologically enlarged lymph nodes identified. No acute vascular findings. Other:  None. Musculoskeletal:  No suspicious bone lesions identified. IMPRESSION: Cholelithiasis, with mild diffuse gallbladder wall thickening. Acute cholecystitis cannot be excluded. Consider nuclear medicine hepatobiliary scan for further evaluation if clinically warranted. Mild dilatation of intrahepatic bile ducts, without no of choledocholithiasis or other obstructing etiology. Small to moderate hiatal hernia. Electronically Signed   By: Norleen DELENA Kil M.D.   On: 11/26/2023 08:49   US  Abdomen Limited RUQ (LIVER/GB) Result Date: 11/25/2023 CLINICAL DATA:  [id number] Abdominal pain, acute, right upper quadrant [id number] EXAM: ULTRASOUND ABDOMEN LIMITED RIGHT UPPER QUADRANT COMPARISON:  November 25, 2023, December 14, 2020 FINDINGS: Gallbladder: Multiple small shadowing gallstones. Asymmetric wall thickening along the hepatic aspect of the gallbladder. No sonographic Murphy's sign noted by sonographer. Common bile duct: Diameter: 11 mm Liver: Normal echogenicity. No focal lesion identified. Mild central intrahepatic biliary ductal dilation. Portal vein is patent on color Doppler imaging with normal direction of blood flow towards the liver. Other: None. IMPRESSION: 1. Mild central intrahepatic biliary ductal dilation with commensurate common bile duct dilation, worrisome for downstream obstruction, possibly from choledocholithiasis. Multiphase abdominal MRI with IV contrast recommended for further characterization. 2. Cholecystolithiasis. Asymmetric wall thickening along the hepatic aspect of the gallbladder, which in the absence of a sonographic Murphy's sign and pericholecystic fluid, may be  related to biliary obstruction and stasis or acute hepatitis. Electronically Signed   By: Rogelia Myers M.D.   On: 11/25/2023 11:45     Anti-infectives: Anti-infectives (From admission, onward)    Start     Dose/Rate Route Frequency Ordered Stop   11/26/23 1145  cefTRIAXone  (ROCEPHIN ) 2 g in sodium chloride  0.9 % 100 mL IVPB  Status:  Discontinued        2 g 200 mL/hr over 30 Minutes Intravenous Every 24 hours 11/26/23 1048 11/26/23 1655        Assessment/Plan POD 1, s/p lap chole, Dr. Tanda 8/21 for acute cholecystitis -follow up labs look good today. Hgb is stable after reinitiation of heparin  overnight -may transition to Xarelto  today -tolerating solid diet and pain well controlled -surgically stable for DC home with appropriate follow up made -d/w primary team   FEN - regular VTE - heparin  gtt ID - no further    LOS: 1 day    Burnard FORBES Banter , [facility] 11/27/2023, 10:45 AM Please see Amion for pager number during day hours 7:00am-4:30pm or 7:00am -11:30am on weekends

## 2023-11-27 NOTE — Discharge Instructions (Signed)

## 2023-11-27 NOTE — TOC Initial Note (Signed)
 Transition of Care [facility]) - Initial/Assessment Note    Patient Details  Name: [name] MRN: [id number] Date of Birth: 06-06-1965  [facility]) CM/SW Contact:    [name], RN Phone Number: 11/27/2023, 10:42 AM  Clinical Narrative:  Met with patient to introduce role of TOC/NCM and review for dc planning, pt confirmed she has an established PCP, no current home care services, uses a cane for functional mobility as needed, reports she feels safe returning home with support from her spouse. Code 44 completed. TOC will continue to follow.                   Expected Discharge Plan: Home/Self Care Barriers to Discharge: Continued Medical Work up   Patient Goals and CMS Choice Patient states their goals for this hospitalization and ongoing recovery are:: return home          Expected Discharge Plan and Services       Living arrangements for the past 2 months: Single Family Home Expected Discharge Date: 11/27/23                                    Prior Living Arrangements/Services Living arrangements for the past 2 months: Single Family Home Lives with:: Spouse Patient language and need for interpreter reviewed:: Yes Do you feel safe going back to the place where you live?: Yes      Need for Family Participation in Patient Care: Yes (Comment) Care giver support system in place?: Yes (comment) Current home services: DME [facility]) Criminal Activity/Legal Involvement Pertinent to Current Situation/Hospitalization: No - Comment as needed  Activities of Daily Living   ADL Screening (condition at time of admission) Independently performs ADLs?: Yes (appropriate for developmental age) Is the patient deaf or have difficulty hearing?: No Does the patient have difficulty seeing, even when wearing glasses/contacts?: No Does the patient have difficulty concentrating, remembering, or making decisions?: No  Permission Sought/Granted                   Emotional Assessment Appearance:: Appears stated age Attitude/Demeanor/Rapport: Engaged Affect (typically observed): Accepting Orientation: : Oriented to Self, Oriented to Place, Oriented to  Time, Oriented to Situation Alcohol / Substance Use: Not Applicable Psych Involvement: No (comment)  Admission diagnosis:  Choledocholithiasis [K80.50] Bile duct obstruction [K83.1] Elevated liver enzymes [R74.8] Patient Active Problem List   Diagnosis Date Noted   Choledocholithiasis 11/25/2023   Hiatal hernia 10/29/2023   History of colonic polyps 10/29/2023   Upper respiratory tract infection 07/22/2023   Primary osteoarthritis of right knee 06/04/2023   Folic acid  deficiency 05/23/2023   Routine adult health maintenance 02/07/2023   Congestion of upper airway 07/22/2022   Recurrent pneumonia 07/08/2022   Acute respiratory failure with hypoxia (HCC) 06/25/2022   Aspiration into respiratory tract 06/25/2022   Injury of right foot 06/12/2022   Right knee pain 05/26/2022   Multifocal pneumonia 11/19/2021   Acute recurrent maxillary sinusitis 10/31/2021   Viral illness 06/05/2021   Chronic diastolic CHF (congestive heart failure) (HCC) 07/19/2020   Bronchospasm 07/19/2020   Incontinence 03/17/2020   Herpes simplex virus (HSV) infection of buttock 01/05/2020   OSA (obstructive sleep apnea) 07/25/2019   Prediabetes 07/25/2019   Lateral epicondylitis of left elbow 01/13/2019   Normocytic anemia 05/29/2018   Scleroderma (HCC) 04/22/2018   Bloody sputum 04/22/2018   Swelling of foot joint, right 04/08/2018  Positive ANA (antinuclear antibody) 09/23/2016   Eczema 01/27/2016   History of pulmonary embolism 09/12/2015   Chronic anticoagulation 09/06/2015   Chemotherapy-induced peripheral neuropathy (HCC) 06/13/2015   CKD (chronic kidney disease) stage 3, GFR 30-59 ml/min (HCC) 05/12/2015   GERD (gastroesophageal reflux disease) 05/12/2015   Ovarian carcinosarcoma, right (HCC)  03/29/2015   Endometrial ca (HCC) 03/29/2015   Ventral hernia without obstruction or gangrene    Morbid obesity (HCC) 02/19/2015   Essential hypertension, benign    Stress at home 12/14/2014   Left knee pain 08/04/2013   Seasonal allergies 07/29/2013   PCP:  Donah Laymon PARAS, MD Pharmacy:   [id number] - [place], [place] - [address] AT [facility] OF [address] [place] [address] Phone: [phone] Fax: [phone]     Social Drivers of Health (SDOH) Social History: SDOH Screenings   Food Insecurity: No Food Insecurity (11/25/2023)  Housing: Unknown (11/25/2023)  Transportation Needs: No Transportation Needs (11/25/2023)  Utilities: Not At Risk (11/25/2023)  Alcohol Screen: Low Risk  (09/07/2023)  Depression (PHQ2-9): Low Risk  (11/19/2023)  Financial Resource Strain: Low Risk  (11/15/2023)  Physical Activity: Sufficiently Active (11/15/2023)  Recent Concern: Physical Activity - Insufficiently Active (08/26/2023)   Received from [facility]  Social Connections: Socially Integrated (11/15/2023)  Stress: No Stress Concern Present (11/15/2023)  Tobacco Use: Low Risk  (11/26/2023)  Health Literacy: Adequate Health Literacy (09/07/2023)   SDOH Interventions:     Readmission Risk Interventions    11/27/2023   10:40 AM 11/26/2023    9:56 AM 06/27/2022    3:59 PM  Readmission Risk Prevention Plan  Post Dischage Appt  Complete   Medication Screening  Complete   Transportation Screening Complete Complete Complete  PCP or Specialist Appt within 5-7 Days Complete  Complete  Home Care Screening Complete  Complete  Medication Review (RN CM) Complete  Complete

## 2023-11-27 NOTE — Care Management CC44 (Signed)
 Condition Code 44 Documentation Completed  Patient Details  Name: [name] MRN: [id number] Date of Birth: [birth year]   Condition Code 44 given:  Yes Patient signature on Condition Code 44 notice:  Yes Documentation of 2 MD's agreement:  Yes Code 44 added to claim:  Yes    [name], RN 11/27/2023, 10:08 AM

## 2023-11-27 NOTE — Progress Notes (Signed)
 PHARMACY - ANTICOAGULATION CONSULT NOTE  Pharmacy Consult for Heparin  Indication: Hx PE, holding Xarelto   Allergies  Allergen Reactions   Tape Itching and Other (See Comments)    NO TEGADERM!!!! It pulls OFF the skin!! Surgical tape is Ok per patient.    Penicillins Rash    Has patient had a PCN reaction causing immediate rash, facial/tongue/throat swelling, SOB or lightheadedness with hypotension: no Has patient had a PCN reaction causing severe rash involving mucus membranes or skin necrosis: no Has patient had a PCN reaction that required hospitalization in the hospital at the time Has patient had a PCN reaction occurring within the last 10 years: no If all of the above answers are NO, then may proceed with Cephalosporin use.     Patient Measurements: Height: 5' 4 (162.6 cm) Weight: (!) 161.8 kg (356 lb 9.6 oz) IBW/kg (Calculated) : 54.7 HEPARIN  DW (KG): 96.4 Last documented weight 162 kg Heparin  dosing weight 96 kg  Vital Signs: Temp: 98.4 F (36.9 C) (08/22 0610) Temp Source: Oral (08/22 0610) BP: 145/79 (08/22 0610) Pulse Rate: 80 (08/22 0610)  Labs: Recent Labs    11/25/23 0923 11/25/23 1404 11/26/23 0433 11/26/23 0435 11/27/23 0511  HGB 12.6 11.9* 11.8*  --  11.4*  HCT 39.2 35.0* 37.5  --  36.6  PLT 197  --  183  --  192  APTT  --   --   --  120* 68*  HEPARINUNFRC  --   --   --  >1.10* 0.64  CREATININE 1.21*  --  0.82  --  1.14*    Estimated Creatinine Clearance: 82.8 mL/min (A) (by C-G formula based on SCr of 1.14 mg/dL (H)).   Medical History: Past Medical History:  Diagnosis Date   Allergy    Anemia    Anxiety    Arthritis    Aspiration pneumonia (HCC)    Blood transfusion without reported diagnosis    Cough    Cough 06/11/2018   Difficulty sleeping 06/11/2018   Diverticulitis with perforation 2010   DVT (deep vein thrombosis) in pregnancy 11/24/2016   DVT, lower extremity (HCC)    Endometrial cancer (HCC)    Enlarged lymph nodes  08/13/2017   Family history of adverse reaction to anesthesia    pts daughter had difficulty awakening following anesthesia, long time to wake up   GERD (gastroesophageal reflux disease)    Headache    Hemoptysis 04/22/2018   History of bronchitis    History of ear infections    Hospital discharge follow-up 03/02/2019   Hx of migraines    Hyperlipidemia    Hypertension    IBS (irritable bowel syndrome)    Kidney infection    Lupus (systemic lupus erythematosus) (HCC) 02/2017   Morbid obesity (HCC)    Neck pain 01/13/2019   Ovarian cancer (HCC) dx'd 01/2015   PONV (postoperative nausea and vomiting)    Port-A-Cath in place    Right side   Portacath in place 09/12/2015   Pre-diabetes    Pyelonephritis    Right knee pain 11/28/2011   Scleroderma (HCC)    Screen for colon cancer 03/02/2019   Slow rate of speech 08/22/2015   Chronic from CVA.  She also has loss of left nasolabial fold and slight left facial droop/small asymmetry on the left side secondary to CVA   UTI (urinary tract infection) 05/07/2018    Medications:  Scheduled:   acetaminophen   1,000 mg Oral Q6H   amLODipine   5 mg Oral  Daily   arformoterol   15 mcg Nebulization BID   And   umeclidinium bromide   1 puff Inhalation Daily   pantoprazole   40 mg Oral Daily   Infusions:   heparin  1,300 Units/hr (11/26/23 2347)    Assessment: 14 yoF presents to ED with abdominal pain, vomiting, found to have evidence of choledocholithiasis.  PMH significnat for SLE, hx PE on Xarelto .  Pharmacy is consulted to dose heparin  while home Xarelto  is held.  Patient reports taking her last dose of Xarelto  8/19 10:30am.   -s/p laparoscopic cholecystectomy on 8/21  Today, 11/27/2023 - aPTT 68 - therapeutic (lower end of range) on infusion running at 1300 units/hr  - HL 0.64 - therapeutic (higher end of range) - No issues with heparin  infusion or signs of bleeding per RN.  - CBC stable  Goal of Therapy:  Heparin  level 0.3-0.7  units/ml aPTT 66-102 seconds Monitor platelets by anticoagulation protocol: Yes   Plan:  -Continue heparin  infusion at 1300 units/hr -Check confirmatory aPTT in 6 hours  -Monitor aPTT until levels are correlated -Daily aPTT, heparin  level, and CBC -Follow up for transition back to Xarelto  as appropriate    Marget Hench, PharmD Clinical Pharmacist 11/27/2023 7:25 AM

## 2023-11-27 NOTE — Discharge Summary (Signed)
 Physician Discharge Summary  [name] FMW:[id number] DOB: [birth year] DOA: 11/25/2023  PCP: Donah Laymon PARAS, MD  Admit date: 11/25/2023 Discharge date: 11/27/2023  Admitted From: Home Disposition:  Home  Discharge Condition:Stable CODE STATUS:FULL Diet recommendation: Heart Healthy  Brief/Interim Summary: Patient is a 58 year old female with history of obesity, SLE, multiple DVTs on Xarelto  who presented with abdominal pain and found to have choledocholithiasis at outside hospital.  Also reported nausea, vomiting, no fever.  On presentation, she was hemodynamically stable.  Lab work showed creatinine of 1.2, AST of 247, ALT 133, T. bili of 1.3.  CT abdomen/pelvis with contrast did not show any acute findings.  Ultrasound of the abdomen showed Mild central intrahepatic biliary ductal dilation with commensurate common bile duct dilation, worrisome for downstream obstruction, possibly from choledocholithiasis, cholecystolithiasis.  GI consulted ,MRCP done.  MRCP showed cholelithiasis, acute cholecystitis is not excluded, no signs of choledocholithiasis.  General surgery consulted , status post laparoscopic cholecystectomy, lysis of adhesions.  She is doing great today, no abdominal pain, nausea or vomiting.  Had a bowel movement.  General surgery cleared for discharge.  Medically stable for discharge home today  Following problems were addressed during the hospitalization:  Elevated liver enzymes/choledocholithiasis: Presented with abdominal pain, nausea, vomiting.  The ultrasound of the abdomen showed intrahepatic biliary ductal dilatation suspicious for choledocholithiasis.  MRCP showed cholelithiasis, acute cholecystitis not excluded, gallbladder wall thickening.  No signs of choledocholithiasis.  General surgery consulted for consideration of cholecystectomy. status post laparoscopic cholecystectomy, lysis of adhesions.  She can follow-up with general surgery as an outpatient    History of multiple DVTs: On Xarelto . Resume on dc   Hypertension: Continue home meds   AKI: Resolved   Morbid obesity:BMI of 61.2   Discharge Diagnoses:  Principal Problem:   Choledocholithiasis    Discharge Instructions  Discharge Instructions     Diet - low sodium heart healthy   Complete by: As directed    Discharge instructions   Complete by: As directed    1)Please take your medications as instructed. 2) you will be called with general surgery for follow-up appointment   Increase activity slowly   Complete by: As directed       Allergies as of 11/27/2023       Reactions   Tape Itching, Other (See Comments)   NO TEGADERM!!!! It pulls OFF the skin!! Surgical tape is Ok per patient.    Penicillins Rash   Has patient had a PCN reaction causing immediate rash, facial/tongue/throat swelling, SOB or lightheadedness with hypotension: no Has patient had a PCN reaction causing severe rash involving mucus membranes or skin necrosis: no Has patient had a PCN reaction that required hospitalization in the hospital at the time Has patient had a PCN reaction occurring within the last 10 years: no If all of the above answers are NO, then may proceed with Cephalosporin use.        Medication List     STOP taking these medications    diclofenac  Sodium 1 % Gel Commonly known as: VOLTAREN    ezetimibe  10 MG tablet Commonly known as: ZETIA    Shingrix  injection Generic drug: Zoster Vaccine Adjuvanted   valACYclovir  500 MG tablet Commonly known as: VALTREX        TAKE these medications    acetaminophen  500 MG tablet Commonly known as: TYLENOL  Take 2 tablets (1,000 mg total) by mouth every 6 (six) hours as needed for moderate pain.   albuterol  (2.5 MG/3ML) 0.083% nebulizer solution Commonly  known as: PROVENTIL  Take 3 mLs (2.5 mg total) by nebulization every 6 (six) hours as needed for wheezing or shortness of breath.   albuterol  108 (90 Base) MCG/ACT  inhaler Commonly known as: VENTOLIN  HFA INHALE 2 PUFFS INTO THE LUNGS EVERY 6 HOURS AS NEEDED FOR WHEEZING OR SHORTNESS OF BREATH   amLODipine -olmesartan  5-20 MG tablet Commonly known as: AZOR  Take 1 tablet by mouth daily.   baclofen  10 MG tablet Commonly known as: LIORESAL  Take 1 tablet (10 mg total) by mouth 2 (two) times daily as needed for muscle spasms.   cetirizine  10 MG tablet Commonly known as: ZYRTEC  Take 10 mg by mouth as needed for allergies or rhinitis.   dicyclomine  10 MG capsule Commonly known as: BENTYL  Take 10 mg by mouth as needed.   famotidine  40 MG tablet Commonly known as: PEPCID  Take 1 tablet (40 mg total) by mouth at bedtime. What changed:  when to take this reasons to take this   ferrous sulfate  325 (65 FE) MG EC tablet Take 1 tablet (325 mg total) by mouth daily with breakfast. What changed: when to take this   fesoterodine  8 MG Tb24 tablet Commonly known as: TOVIAZ  Take 1 tablet (8 mg total) by mouth daily. What changed: when to take this   fluticasone  50 MCG/ACT nasal spray Commonly known as: FLONASE  Place 2 sprays into both nostrils in the morning and at bedtime. What changed:  when to take this reasons to take this   folic acid  1 MG tablet Commonly known as: FOLVITE  Take 1 tablet (1 mg total) by mouth daily.   furosemide  20 MG tablet Commonly known as: LASIX  Take 1 tablet (20 mg total) by mouth daily as needed for edema or fluid.   omeprazole  40 MG capsule Commonly known as: PRILOSEC TAKE 1 CAPSULE(40 MG) BY MOUTH IN THE MORNING AND AT BEDTIME What changed: See the new instructions.   ondansetron  4 MG tablet Commonly known as: ZOFRAN  Take 1 tablet (4 mg total) by mouth every 6 (six) hours as needed for nausea.   OXYGEN Inhale 2 L/min into the lungs at bedtime as needed (for shortness of breath).   Stiolto Respimat  2.5-2.5 MCG/ACT Aers Generic drug: Tiotropium Bromide-Olodaterol Inhale 2 puffs into the lungs daily.    traMADol  50 MG tablet Commonly known as: ULTRAM  Take 50 mg by mouth daily as needed (for pain).   triamcinolone  cream 0.1 % Commonly known as: KENALOG  Apply 1 Application topically 2 (two) times daily as needed (Skin irritation.).   Vitamin D3 50 MCG (2000 UT) Tabs Take 2,000 Units by mouth daily.   Wegovy  1 MG/0.5ML Soaj SQ injection Generic drug: semaglutide -weight management Inject 1 mg into the skin once a week. What changed: when to take this   Xarelto  20 MG Tabs tablet Generic drug: rivaroxaban  TAKE 1 TABLET(20 MG) BY MOUTH IN THE MORNING        Allergies  Allergen Reactions   Tape Itching and Other (See Comments)    NO TEGADERM!!!! It pulls OFF the skin!! Surgical tape is Ok per patient.    Penicillins Rash    Has patient had a PCN reaction causing immediate rash, facial/tongue/throat swelling, SOB or lightheadedness with hypotension: no Has patient had a PCN reaction causing severe rash involving mucus membranes or skin necrosis: no Has patient had a PCN reaction that required hospitalization in the hospital at the time Has patient had a PCN reaction occurring within the last 10 years: no If all of the above  answers are NO, then may proceed with Cephalosporin use.     Consultations:    Procedures/Studies: MR ABDOMEN MRCP W WO CONTAST Result Date: 11/26/2023 CLINICAL DATA:  Right upper quadrant pain. Cholelithiasis. Biliary ductal dilatation on recent ultrasound. EXAM: MRI ABDOMEN WITHOUT AND WITH CONTRAST (INCLUDING MRCP) TECHNIQUE: Multiplanar multisequence MR imaging of the abdomen was performed both before and after the administration of intravenous contrast. Heavily T2-weighted images of the biliary and pancreatic ducts were obtained, and three-dimensional MRCP images were rendered by post processing. CONTRAST:  10mL GADAVIST  GADOBUTROL  1 MMOL/ML IV SOLN COMPARISON:  Ultrasound and CT on 11/25/2023 FINDINGS: Lower chest: No acute findings. Hepatobiliary: No  hepatic masses identified. Gallbladder is distended tiny gallstones are seen. Mild diffuse gallbladder wall thickening is seen, without pericholecystic inflammatory changes. These findings are equivocal for acute cholecystitis. Mild dilatation of intrahepatic bile ducts is seen, however, there is gradual tapering of the common bile duct which is nondilated measuring 5 mm in diameter. No evidence of choledocholithiasis or biliary stricture. Pancreas: No mass or inflammatory changes. No evidence of pancreatic ductal dilatation or pancreas divisum. Spleen:  Within normal limits in size and appearance. Adrenals/Urinary Tract: No suspicious masses identified. Mild left renal parenchymal scarring noted. No evidence of hydronephrosis. Stomach/Bowel: Small to moderate hiatal hernia is seen. Otherwise unremarkable. Vascular/Lymphatic: No pathologically enlarged lymph nodes identified. No acute vascular findings. Other:  None. Musculoskeletal:  No suspicious bone lesions identified. IMPRESSION: Cholelithiasis, with mild diffuse gallbladder wall thickening. Acute cholecystitis cannot be excluded. Consider nuclear medicine hepatobiliary scan for further evaluation if clinically warranted. Mild dilatation of intrahepatic bile ducts, without no of choledocholithiasis or other obstructing etiology. Small to moderate hiatal hernia. Electronically Signed   By: Norleen DELENA Kil M.D.   On: 11/26/2023 08:49   MR 3D Recon At Scanner Result Date: 11/26/2023 CLINICAL DATA:  Right upper quadrant pain. Cholelithiasis. Biliary ductal dilatation on recent ultrasound. EXAM: MRI ABDOMEN WITHOUT AND WITH CONTRAST (INCLUDING MRCP) TECHNIQUE: Multiplanar multisequence MR imaging of the abdomen was performed both before and after the administration of intravenous contrast. Heavily T2-weighted images of the biliary and pancreatic ducts were obtained, and three-dimensional MRCP images were rendered by post processing. CONTRAST:  10mL GADAVIST   GADOBUTROL  1 MMOL/ML IV SOLN COMPARISON:  Ultrasound and CT on 11/25/2023 FINDINGS: Lower chest: No acute findings. Hepatobiliary: No hepatic masses identified. Gallbladder is distended tiny gallstones are seen. Mild diffuse gallbladder wall thickening is seen, without pericholecystic inflammatory changes. These findings are equivocal for acute cholecystitis. Mild dilatation of intrahepatic bile ducts is seen, however, there is gradual tapering of the common bile duct which is nondilated measuring 5 mm in diameter. No evidence of choledocholithiasis or biliary stricture. Pancreas: No mass or inflammatory changes. No evidence of pancreatic ductal dilatation or pancreas divisum. Spleen:  Within normal limits in size and appearance. Adrenals/Urinary Tract: No suspicious masses identified. Mild left renal parenchymal scarring noted. No evidence of hydronephrosis. Stomach/Bowel: Small to moderate hiatal hernia is seen. Otherwise unremarkable. Vascular/Lymphatic: No pathologically enlarged lymph nodes identified. No acute vascular findings. Other:  None. Musculoskeletal:  No suspicious bone lesions identified. IMPRESSION: Cholelithiasis, with mild diffuse gallbladder wall thickening. Acute cholecystitis cannot be excluded. Consider nuclear medicine hepatobiliary scan for further evaluation if clinically warranted. Mild dilatation of intrahepatic bile ducts, without no of choledocholithiasis or other obstructing etiology. Small to moderate hiatal hernia. Electronically Signed   By: Norleen DELENA Kil M.D.   On: 11/26/2023 08:49   US  Abdomen Limited RUQ (LIVER/GB) Result Date: 11/25/2023 CLINICAL  DATA:  [id number] Abdominal pain, acute, right upper quadrant [id number] EXAM: ULTRASOUND ABDOMEN LIMITED RIGHT UPPER QUADRANT COMPARISON:  November 25, 2023, December 14, 2020 FINDINGS: Gallbladder: Multiple small shadowing gallstones. Asymmetric wall thickening along the hepatic aspect of the gallbladder. No sonographic Murphy's sign noted by  sonographer. Common bile duct: Diameter: 11 mm Liver: Normal echogenicity. No focal lesion identified. Mild central intrahepatic biliary ductal dilation. Portal vein is patent on color Doppler imaging with normal direction of blood flow towards the liver. Other: None. IMPRESSION: 1. Mild central intrahepatic biliary ductal dilation with commensurate common bile duct dilation, worrisome for downstream obstruction, possibly from choledocholithiasis. Multiphase abdominal MRI with IV contrast recommended for further characterization. 2. Cholecystolithiasis. Asymmetric wall thickening along the hepatic aspect of the gallbladder, which in the absence of a sonographic Murphy's sign and pericholecystic fluid, may be related to biliary obstruction and stasis or acute hepatitis. Electronically Signed   By: Rogelia Myers M.D.   On: 11/25/2023 11:45   CT ABDOMEN PELVIS W CONTRAST Result Date: 11/25/2023 CLINICAL DATA:  Acute epigastric pain beginning this morning. EXAM: CT ABDOMEN AND PELVIS WITH CONTRAST TECHNIQUE: Multidetector CT imaging of the abdomen and pelvis was performed using the standard protocol following bolus administration of intravenous contrast. RADIATION DOSE REDUCTION: This exam was performed according to the departmental dose-optimization program which includes automated exposure control, adjustment of the mA and/or kV according to patient size and/or use of iterative reconstruction technique. CONTRAST:  OMNIPAQUE  IOHEXOL  300 MG/ML  SOLN COMPARISON:  12/14/2020 FINDINGS: Lower Chest: No acute findings. Hepatobiliary: No suspicious hepatic masses identified. Gallbladder is unremarkable. No evidence of biliary ductal dilatation. Pancreas:  No mass or inflammatory changes. Spleen: Within normal limits in size and appearance. Adrenals/Urinary Tract: Stable mild bilateral renal parenchymal scarring. No suspicious masses identified. Punctate calculus seen in midpole left kidney. No evidence of ureteral  calculi or hydronephrosis. Unremarkable unopacified urinary bladder. Stomach/Bowel: Small to moderate hiatal hernia shows no significant change. No evidence of obstruction, inflammatory process or abnormal fluid collections. Vascular/Lymphatic: No pathologically enlarged lymph nodes. No acute vascular findings. Reproductive: Prior hysterectomy noted. Adnexal regions are unremarkable in appearance. Other:  None. Musculoskeletal:  No suspicious bone lesions identified. IMPRESSION: No acute findings. Stable small to moderate hiatal hernia. Tiny left renal calculus. No evidence of ureteral calculi or hydronephrosis. Electronically Signed   By: Norleen DELENA Kil M.D.   On: 11/25/2023 10:45      Subjective: Patient seen and examined at bedside today.  Hemodynamically stable.  Medically stable for discharge.  No abdominal pain, nausea or vomiting.  Had a bowel movement  Discharge Exam: Vitals:   11/27/23 0845 11/27/23 1009  BP: 138/83   Pulse: 90   Resp: 16   Temp: 98 F (36.7 C)   SpO2: 97% 92%   Vitals:   11/27/23 0141 11/27/23 0610 11/27/23 0845 11/27/23 1009  BP: (!) 142/82 (!) 145/79 138/83   Pulse: 81 80 90   Resp: 16 16 16    Temp: 98.4 F (36.9 C) 98.4 F (36.9 C) 98 F (36.7 C)   TempSrc: Oral Oral Oral   SpO2: 96% 96% 97% 92%  Weight:      Height:        General: Pt is alert, awake, not in acute distress, morbidly obese Cardiovascular: RRR, S1/S2 +, no rubs, no gallops Respiratory: CTA bilaterally, no wheezing, no rhonchi Abdominal: Soft, NT, ND, bowel sounds +, cholecystectomy surgical clean wounds Extremities: no edema, no cyanosis    The results  of significant diagnostics from this hospitalization (including imaging, microbiology, ancillary and laboratory) are listed below for reference.     Microbiology: No results found for this or any previous visit (from the past 240 hours).   Labs: BNP (last 3 results) Recent Labs    12/23/22 1702  BNP 49.7   Basic Metabolic  Panel: Recent Labs  Lab 11/25/23 0923 11/25/23 1404 11/26/23 0433 11/27/23 0511  NA 142 141 142 136  K 4.0 4.1 3.5 3.8  CL 107  --  109 105  CO2 22  --  23 22  GLUCOSE 139*  --  85 131*  BUN 17  --  16 14  CREATININE 1.21*  --  0.82 1.14*  CALCIUM  9.9  --  9.2 8.7*   Liver Function Tests: Recent Labs  Lab 11/25/23 0923 11/26/23 0435 11/27/23 0511  AST 247* 88* 69*  ALT 113* 84* 76*  ALKPHOS 157* 115 98  BILITOT 1.3* 0.7 0.5  PROT 6.8 6.2* 6.3*  ALBUMIN 3.9 3.2* 3.0*   Recent Labs  Lab 11/25/23 0923  LIPASE 32   No results for input(s): AMMONIA in the last 168 hours. CBC: Recent Labs  Lab 11/25/23 0923 11/25/23 1404 11/26/23 0433 11/27/23 0511  WBC 7.2  --  6.8 8.8  NEUTROABS 5.5  --   --   --   HGB 12.6 11.9* 11.8* 11.4*  HCT 39.2 35.0* 37.5 36.6  MCV 89.5  --  92.6 93.1  PLT 197  --  183 192   Cardiac Enzymes: No results for input(s): CKTOTAL, CKMB, CKMBINDEX, TROPONINI in the last 168 hours. BNP: Invalid input(s): POCBNP CBG: No results for input(s): GLUCAP in the last 168 hours. D-Dimer No results for input(s): DDIMER in the last 72 hours. Hgb A1c No results for input(s): HGBA1C in the last 72 hours. Lipid Profile No results for input(s): CHOL, HDL, LDLCALC, TRIG, CHOLHDL, LDLDIRECT in the last 72 hours. Thyroid  function studies No results for input(s): TSH, T4TOTAL, T3FREE, THYROIDAB in the last 72 hours.  Invalid input(s): FREET3 Anemia work up No results for input(s): VITAMINB12, FOLATE, FERRITIN, TIBC, IRON, RETICCTPCT in the last 72 hours. Urinalysis    Component Value Date/Time   COLORURINE YELLOW 11/25/2023 1200   APPEARANCEUR CLEAR 11/25/2023 1200   LABSPEC 1.040 (H) 11/25/2023 1200   LABSPEC 1.015 04/23/2015 1247   PHURINE 5.5 11/25/2023 1200   GLUCOSEU NEGATIVE 11/25/2023 1200   GLUCOSEU Negative 04/23/2015 1247   HGBUR NEGATIVE 11/25/2023 1200   BILIRUBINUR NEGATIVE  11/25/2023 1200   BILIRUBINUR Color Interference 04/23/2015 1247   KETONESUR NEGATIVE 11/25/2023 1200   PROTEINUR NEGATIVE 11/25/2023 1200   UROBILINOGEN Color Interference 04/23/2015 1247   NITRITE NEGATIVE 11/25/2023 1200   LEUKOCYTESUR NEGATIVE 11/25/2023 1200   LEUKOCYTESUR Color Interference 04/23/2015 1247   Sepsis Labs Recent Labs  Lab 11/25/23 0923 11/26/23 0433 11/27/23 0511  WBC 7.2 6.8 8.8   Microbiology No results found for this or any previous visit (from the past 240 hours).  Please note: You were cared for by a hospitalist during your hospital stay. Once you are discharged, your primary care physician will handle any further medical issues. Please note that NO REFILLS for any discharge medications will be authorized once you are discharged, as it is imperative that you return to your primary care physician (or establish a relationship with a primary care physician if you do not have one) for your post hospital discharge needs so that they can reassess your need for medications  and monitor your lab values.    Time coordinating discharge: 40 minutes  SIGNED:   Ivonne Mustache, MD  [facility] 11/27/2023, 10:24 AM Pager [phone]  If 7PM-7AM, please contact night-coverage [email or web address] Password [email or web address]

## 2023-11-30 ENCOUNTER — Telehealth: Payer: Self-pay

## 2023-11-30 LAB — SURGICAL PATHOLOGY

## 2023-11-30 NOTE — Telephone Encounter (Signed)
 Patient calls nurse line requesting a prescription.   She reports she recently had her gallbladder removed and reports she is having an allergic reaction to the surgical tape.   She reports she had two IVs placed, one in each arm, however the areas are irritated and itchy.  She denies any swelling or redness. No warmth or drainage.   She is requesting a prescription to help alleviate symptoms.   Advised will forward to PCP.   Patient has PCP apt scheduled for September.

## 2023-12-01 MED ORDER — TRIAMCINOLONE ACETONIDE 0.1 % EX CREA: 1.0000 | TOPICAL_CREAM | Freq: Two times a day (BID) | CUTANEOUS | 0 refills | Status: AC | PRN

## 2023-12-01 NOTE — Telephone Encounter (Signed)
 Called patient and provided with update.   Patient voices understanding.   Patient is also asking about Wegovy  prescription.   Per OV note from 11/19/23, patient was to be increased to 1.7 mg dosing. Patient reports that pharmacy never received this prescription.   Patient is also asking with all that has been going on with her recent hospitalization, if she should stay on 1 mg or increase to 1.7 mg. Patient will need new prescription to be sent to the pharmacy either way as she is out of refills.   [facility] [address]   Chiquita JAYSON English, RN

## 2023-12-01 NOTE — Telephone Encounter (Signed)
 Sent in a refill of her triamcinolone  cream. She can put it where the tape is making her itch.  If not improved with this should be seen in person  Thanks! Laymon JINNY Legions, MD

## 2023-12-02 ENCOUNTER — Telehealth: Payer: Self-pay | Admitting: *Deleted

## 2023-12-02 MED ORDER — WEGOVY 1 MG/0.5ML ~~LOC~~ SOAJ: 1.0000 mg | SUBCUTANEOUS | 1 refills | Status: DC

## 2023-12-02 NOTE — Telephone Encounter (Signed)
  [name] 1965-09-30 [id number]  @DATE @   Dear Dr. Donah:  We have scheduled the above named patient for a(n) EGD/Colonoscopy procedure. Our records show that (s)he is on anticoagulation therapy.  Please advise as to whether the patient may come off their therapy of Xarelto  2 days prior to their procedure which is scheduled for 12/24/23.  Please route your response to Powell Misty, CMA or fax response to [phone].  Sincerely,   Powell Misty, [facility]

## 2023-12-02 NOTE — Telephone Encounter (Signed)
 Please let patient know: - I apologize, apparently I did not send in the higher dose before - my mistake - With her recent abdominal surgery it probably makes sense to stay on the 1mg  weekly dose. I sent this in for her. - Let me know if she has any other questions.  Thank you! Laymon JINNY Legions, MD

## 2023-12-02 NOTE — Telephone Encounter (Signed)
 As the patient is aware of the transient risks of thrombosis while off anticoagulation and accepts those risks, I am okay with her holding her anticoagulation as suggested in anticipation of her procedure.  Laymon JINNY Legions, MD

## 2023-12-03 NOTE — Telephone Encounter (Signed)
 Spoke with patient.   Advised to continue 1mg  considering her recent abdominal surgery.   She voiced understanding and was appreciative of the call.

## 2023-12-09 NOTE — Telephone Encounter (Signed)
 Patient informed she may hold Xarelto.

## 2023-12-10 ENCOUNTER — Encounter: Payer: Self-pay | Admitting: Pulmonary Disease

## 2023-12-10 ENCOUNTER — Ambulatory Visit (INDEPENDENT_AMBULATORY_CARE_PROVIDER_SITE_OTHER): Admitting: Pulmonary Disease

## 2023-12-10 VITALS — BP 159/84 | HR 74 | Temp 97.8°F | Ht 65.0 in | Wt 356.2 lb

## 2023-12-10 DIAGNOSIS — K449 Diaphragmatic hernia without obstruction or gangrene: Secondary | ICD-10-CM

## 2023-12-10 DIAGNOSIS — J069 Acute upper respiratory infection, unspecified: Secondary | ICD-10-CM

## 2023-12-10 DIAGNOSIS — J0101 Acute recurrent maxillary sinusitis: Secondary | ICD-10-CM

## 2023-12-10 DIAGNOSIS — G4733 Obstructive sleep apnea (adult) (pediatric): Secondary | ICD-10-CM

## 2023-12-10 DIAGNOSIS — J189 Pneumonia, unspecified organism: Secondary | ICD-10-CM | POA: Diagnosis not present

## 2023-12-10 DIAGNOSIS — J9601 Acute respiratory failure with hypoxia: Secondary | ICD-10-CM

## 2023-12-10 DIAGNOSIS — J988 Other specified respiratory disorders: Secondary | ICD-10-CM

## 2023-12-10 MED ORDER — DOXYCYCLINE HYCLATE 100 MG PO TABS: 100.0000 mg | ORAL_TABLET | Freq: Two times a day (BID) | ORAL | 0 refills | Status: DC

## 2023-12-10 NOTE — Progress Notes (Addendum)
 @Patient  ID: [name], female    DOB: September 11, 1965, 58 y.o.   MRN: [id number]  Chief Complaint  Patient presents with   Medical Management of Chronic Issues    Pneumonia f/u     Referring provider: Donah Laymon PARAS, MD  HPI:   58 y.o. woman whom we are seeing in for in follow up of recurrent pneumonia due to aspiration.  Multiple recent hospital notes reviewed. Multiple PCP notes reviewed.   Overall doing okay.  Had worsening cough with hemoptysis 07/2023. CXR with infiltrate on review and interpretation. Treated with abx, Hemoptysis resolved.  Repeat x-ray 5/25 reviewed, infiltrate resolved.  Discussed this is great news.  High suspicion of recurrent aspiration and developed aspiration pneumonia.  Hemoptysis related to coughing as well as Xarelto  usage.  Hemoptysis is scant and resolved even with ongoing Xarelto  administration.  Worsened cough over the last 2 or 3 days but not mall colored.  Otherwise feeling well.  Scant hemoptysis x 1 on a couple days ago.  Cough worse at night every night.  Has upcoming endoscopy and colonoscopy.  Notably recently admitted with biliary obstruction.  Had her gallbladder removed.    HPI at initial visit: Patient reports history of recurrent pneumonia.  For started after diagnosis of concomitant ovarian and endometrial cancer.  She received chemotherapy for this.  Also had hysterectomy.  Last chemotherapy dose about 6 years ago.  Notably, port still in place.  Ever since then she has gotten pneumonias at least once a year.  Treated with antibiotics and usually symptoms improved with course of weeks.  She reports recent aspiration pneumonia, diagnosed earlier in 2023.  Treated with antibiotics improved.  She had recurrent pneumonia 11/2021 on chest x-ray, my review and interpretation reveals nodular opacities bilaterally, right greater than left with more confluent densities right lower lung field.  Symptoms were preceded by sinus infection.  She  received antibiotics for this.  Developed pneumonia in spite of this.  Required 2 additional courses of antibiotics.  Repeat chest x-ray 12/2021 my review interpretation reveals similar distribution of nodular opacities bilaterally right greater than left with interval decrease in size and decrease confluence in the right lower lung field indicative of improving infectious pneumonia.  She had the Streptococcus immunization last year.  She reports he has a hiatal hernia.  She reports significant reflux symptoms.  On a PPI which helps with the burning but bad reflux.  She wakes up on a nightly basis with coughing.  PMH: Ovarian and endometrial cancer in remission, GERD, childhood asthma, history of recurrent PE on chronic Xarelto  Surgical history: Hysterectomy, bowel resection, C-section, ventral hernia repair, tonsillectomy Family history: Mother with diabetes, hypertension, hyperlipidemia, father with hyperlipidemia Social history: Never smoker, lives in [place] / Pulmonary Flowsheets:   ACT:      No data to display          MMRC:     No data to display          Epworth:      No data to display          Tests:   FENO:  No results found for: NITRICOXIDE  PFT:     No data to display          WALK:     07/08/2022   10:55 AM  SIX MIN WALK  Supplimental Oxygen during Test? (L/min) No  Tech Comments: Pt slow paced drop and the end of the lap, she recovered  once sitting back to 96%    Imaging: Personally reviewed and as per EMR and discussion in this note MR ABDOMEN MRCP W WO CONTAST Result Date: 11/26/2023 CLINICAL DATA:  Right upper quadrant pain. Cholelithiasis. Biliary ductal dilatation on recent ultrasound. EXAM: MRI ABDOMEN WITHOUT AND WITH CONTRAST (INCLUDING MRCP) TECHNIQUE: Multiplanar multisequence MR imaging of the abdomen was performed both before and after the administration of intravenous contrast. Heavily T2-weighted images of the  biliary and pancreatic ducts were obtained, and three-dimensional MRCP images were rendered by post processing. CONTRAST:  10mL GADAVIST  GADOBUTROL  1 MMOL/ML IV SOLN COMPARISON:  Ultrasound and CT on 11/25/2023 FINDINGS: Lower chest: No acute findings. Hepatobiliary: No hepatic masses identified. Gallbladder is distended tiny gallstones are seen. Mild diffuse gallbladder wall thickening is seen, without pericholecystic inflammatory changes. These findings are equivocal for acute cholecystitis. Mild dilatation of intrahepatic bile ducts is seen, however, there is gradual tapering of the common bile duct which is nondilated measuring 5 mm in diameter. No evidence of choledocholithiasis or biliary stricture. Pancreas: No mass or inflammatory changes. No evidence of pancreatic ductal dilatation or pancreas divisum. Spleen:  Within normal limits in size and appearance. Adrenals/Urinary Tract: No suspicious masses identified. Mild left renal parenchymal scarring noted. No evidence of hydronephrosis. Stomach/Bowel: Small to moderate hiatal hernia is seen. Otherwise unremarkable. Vascular/Lymphatic: No pathologically enlarged lymph nodes identified. No acute vascular findings. Other:  None. Musculoskeletal:  No suspicious bone lesions identified. IMPRESSION: Cholelithiasis, with mild diffuse gallbladder wall thickening. Acute cholecystitis cannot be excluded. Consider nuclear medicine hepatobiliary scan for further evaluation if clinically warranted. Mild dilatation of intrahepatic bile ducts, without no of choledocholithiasis or other obstructing etiology. Small to moderate hiatal hernia. Electronically Signed   By: Norleen DELENA Kil M.D.   On: 11/26/2023 08:49   MR 3D Recon At Scanner Result Date: 11/26/2023 CLINICAL DATA:  Right upper quadrant pain. Cholelithiasis. Biliary ductal dilatation on recent ultrasound. EXAM: MRI ABDOMEN WITHOUT AND WITH CONTRAST (INCLUDING MRCP) TECHNIQUE: Multiplanar multisequence MR imaging of  the abdomen was performed both before and after the administration of intravenous contrast. Heavily T2-weighted images of the biliary and pancreatic ducts were obtained, and three-dimensional MRCP images were rendered by post processing. CONTRAST:  10mL GADAVIST  GADOBUTROL  1 MMOL/ML IV SOLN COMPARISON:  Ultrasound and CT on 11/25/2023 FINDINGS: Lower chest: No acute findings. Hepatobiliary: No hepatic masses identified. Gallbladder is distended tiny gallstones are seen. Mild diffuse gallbladder wall thickening is seen, without pericholecystic inflammatory changes. These findings are equivocal for acute cholecystitis. Mild dilatation of intrahepatic bile ducts is seen, however, there is gradual tapering of the common bile duct which is nondilated measuring 5 mm in diameter. No evidence of choledocholithiasis or biliary stricture. Pancreas: No mass or inflammatory changes. No evidence of pancreatic ductal dilatation or pancreas divisum. Spleen:  Within normal limits in size and appearance. Adrenals/Urinary Tract: No suspicious masses identified. Mild left renal parenchymal scarring noted. No evidence of hydronephrosis. Stomach/Bowel: Small to moderate hiatal hernia is seen. Otherwise unremarkable. Vascular/Lymphatic: No pathologically enlarged lymph nodes identified. No acute vascular findings. Other:  None. Musculoskeletal:  No suspicious bone lesions identified. IMPRESSION: Cholelithiasis, with mild diffuse gallbladder wall thickening. Acute cholecystitis cannot be excluded. Consider nuclear medicine hepatobiliary scan for further evaluation if clinically warranted. Mild dilatation of intrahepatic bile ducts, without no of choledocholithiasis or other obstructing etiology. Small to moderate hiatal hernia. Electronically Signed   By: Norleen DELENA Kil M.D.   On: 11/26/2023 08:49   US  Abdomen Limited RUQ (LIVER/GB) Result Date:  11/25/2023 CLINICAL DATA:  [id number] Abdominal pain, acute, right upper quadrant [id number] EXAM:  ULTRASOUND ABDOMEN LIMITED RIGHT UPPER QUADRANT COMPARISON:  November 25, 2023, December 14, 2020 FINDINGS: Gallbladder: Multiple small shadowing gallstones. Asymmetric wall thickening along the hepatic aspect of the gallbladder. No sonographic Murphy's sign noted by sonographer. Common bile duct: Diameter: 11 mm Liver: Normal echogenicity. No focal lesion identified. Mild central intrahepatic biliary ductal dilation. Portal vein is patent on color Doppler imaging with normal direction of blood flow towards the liver. Other: None. IMPRESSION: 1. Mild central intrahepatic biliary ductal dilation with commensurate common bile duct dilation, worrisome for downstream obstruction, possibly from choledocholithiasis. Multiphase abdominal MRI with IV contrast recommended for further characterization. 2. Cholecystolithiasis. Asymmetric wall thickening along the hepatic aspect of the gallbladder, which in the absence of a sonographic Murphy's sign and pericholecystic fluid, may be related to biliary obstruction and stasis or acute hepatitis. Electronically Signed   By: Rogelia Myers M.D.   On: 11/25/2023 11:45   CT ABDOMEN PELVIS W CONTRAST Result Date: 11/25/2023 CLINICAL DATA:  Acute epigastric pain beginning this morning. EXAM: CT ABDOMEN AND PELVIS WITH CONTRAST TECHNIQUE: Multidetector CT imaging of the abdomen and pelvis was performed using the standard protocol following bolus administration of intravenous contrast. RADIATION DOSE REDUCTION: This exam was performed according to the departmental dose-optimization program which includes automated exposure control, adjustment of the mA and/or kV according to patient size and/or use of iterative reconstruction technique. CONTRAST:  OMNIPAQUE  IOHEXOL  300 MG/ML  SOLN COMPARISON:  12/14/2020 FINDINGS: Lower Chest: No acute findings. Hepatobiliary: No suspicious hepatic masses identified. Gallbladder is unremarkable. No evidence of biliary ductal dilatation. Pancreas:   No mass or inflammatory changes. Spleen: Within normal limits in size and appearance. Adrenals/Urinary Tract: Stable mild bilateral renal parenchymal scarring. No suspicious masses identified. Punctate calculus seen in midpole left kidney. No evidence of ureteral calculi or hydronephrosis. Unremarkable unopacified urinary bladder. Stomach/Bowel: Small to moderate hiatal hernia shows no significant change. No evidence of obstruction, inflammatory process or abnormal fluid collections. Vascular/Lymphatic: No pathologically enlarged lymph nodes. No acute vascular findings. Reproductive: Prior hysterectomy noted. Adnexal regions are unremarkable in appearance. Other:  None. Musculoskeletal:  No suspicious bone lesions identified. IMPRESSION: No acute findings. Stable small to moderate hiatal hernia. Tiny left renal calculus. No evidence of ureteral calculi or hydronephrosis. Electronically Signed   By: Norleen DELENA Kil M.D.   On: 11/25/2023 10:45    Lab Results: Personally reviewed CBC    Component Value Date/Time   WBC 8.8 11/27/2023 0511   RBC 3.93 11/27/2023 0511   HGB 11.4 (L) 11/27/2023 0511   HGB 11.1 09/07/2023 0904   HGB 11.6 01/17/2016 0928   HCT 36.6 11/27/2023 0511   HCT 35.4 09/07/2023 0904   HCT 35.2 01/17/2016 0928   PLT 192 11/27/2023 0511   PLT 215 09/07/2023 0904   MCV 93.1 11/27/2023 0511   MCV 92 09/07/2023 0904   MCV 93.1 01/17/2016 0928   MCH 29.0 11/27/2023 0511   MCHC 31.1 11/27/2023 0511   RDW 14.5 11/27/2023 0511   RDW 14.7 09/07/2023 0904   RDW 14.0 01/17/2016 0928   LYMPHSABS 1.1 11/25/2023 0923   LYMPHSABS 1.1 09/16/2022 0941   LYMPHSABS 1.9 01/17/2016 0928   MONOABS 0.5 11/25/2023 0923   MONOABS 0.5 01/17/2016 0928   EOSABS 0.1 11/25/2023 0923   EOSABS 0.2 09/16/2022 0941   BASOSABS 0.0 11/25/2023 0923   BASOSABS 0.1 09/16/2022 0941   BASOSABS 0.0 01/17/2016 9071  BMET    Component Value Date/Time   NA 136 11/27/2023 0511   NA 142 09/16/2022 0941    NA 140 01/17/2016 0928   K 3.8 11/27/2023 0511   K 4.8 01/17/2016 0928   CL 105 11/27/2023 0511   CO2 22 11/27/2023 0511   CO2 22 01/17/2016 0928   GLUCOSE 131 (H) 11/27/2023 0511   GLUCOSE 103 01/17/2016 0928   BUN 14 11/27/2023 0511   BUN 21 09/16/2022 0941   BUN 28.7 (H) 01/17/2016 0928   CREATININE 1.14 (H) 11/27/2023 0511   CREATININE 1.1 01/17/2016 0928   CALCIUM  8.7 (L) 11/27/2023 0511   CALCIUM  10.0 01/17/2016 0928   GFRNONAA 56 (L) 11/27/2023 0511   GFRNONAA 77 02/26/2015 0945   GFRAA 59 (L) 07/20/2019 1004   GFRAA 89 02/26/2015 0945    BNP    Component Value Date/Time   BNP 49.7 12/23/2022 1702   BNP 68.3 06/25/2022 0452    ProBNP No results found for: PROBNP  Specialty Problems       Pulmonary Problems   Bloody sputum   OSA (obstructive sleep apnea)   Bronchospasm   Acute recurrent maxillary sinusitis   Multifocal pneumonia   Acute respiratory failure with hypoxia (HCC)   Aspiration into respiratory tract   Recurrent pneumonia   Congestion of upper airway   Upper respiratory tract infection   Hiatal hernia    Allergies  Allergen Reactions   Tape Itching and Other (See Comments)    NO TEGADERM!!!! It pulls OFF the skin!! Surgical tape is Ok per patient.    Penicillins Rash    Has patient had a PCN reaction causing immediate rash, facial/tongue/throat swelling, SOB or lightheadedness with hypotension: no Has patient had a PCN reaction causing severe rash involving mucus membranes or skin necrosis: no Has patient had a PCN reaction that required hospitalization in the hospital at the time Has patient had a PCN reaction occurring within the last 10 years: no If all of the above answers are NO, then may proceed with Cephalosporin use.     Immunization History  Administered Date(s) Administered   Influenza Split 06/10/2011   Influenza, Seasonal, Injecte, Preservative Fre 02/05/2023   Influenza,inj,Quad PF,6+ Mos 12/12/2014, 01/17/2016,  12/04/2016, 01/14/2018, 02/01/2019, 01/05/2020, 12/20/2020, 01/09/2022   Moderna Sars-Covid-2 Vaccination 06/24/2019, 07/25/2019   PFIZER(Purple Top)SARS-COV-2 Vaccination 03/15/2020   PNEUMOCOCCAL CONJUGATE-20 12/20/2020   PPD Test 02/02/2019   Td 07/11/2008   Tdap 05/30/2022    Past Medical History:  Diagnosis Date   Allergy    Anemia    Anxiety    Arthritis    Aspiration pneumonia (HCC)    Blood transfusion without reported diagnosis    Cough    Cough 06/11/2018   Difficulty sleeping 06/11/2018   Diverticulitis with perforation 2010   DVT (deep vein thrombosis) in pregnancy 11/24/2016   DVT, lower extremity (HCC)    Endometrial cancer (HCC)    Enlarged lymph nodes 08/13/2017   Family history of adverse reaction to anesthesia    pts daughter had difficulty awakening following anesthesia, long time to wake up   GERD (gastroesophageal reflux disease)    Headache    Hemoptysis 04/22/2018   History of bronchitis    History of ear infections    Hospital discharge follow-up 03/02/2019   Hx of migraines    Hyperlipidemia    Hypertension    IBS (irritable bowel syndrome)    Kidney infection    Lupus (systemic lupus erythematosus) (HCC) 02/2017  Morbid obesity (HCC)    Neck pain 01/13/2019   Ovarian cancer (HCC) dx'd 01/2015   PONV (postoperative nausea and vomiting)    Port-A-Cath in place    Right side   Portacath in place 09/12/2015   Pre-diabetes    Pyelonephritis    Right knee pain 11/28/2011   Scleroderma (HCC)    Screen for colon cancer 03/02/2019   Slow rate of speech 08/22/2015   Chronic from CVA.  She also has loss of left nasolabial fold and slight left facial droop/small asymmetry on the left side secondary to CVA   UTI (urinary tract infection) 05/07/2018    Tobacco History: Social History   Tobacco Use  Smoking Status Never  Smokeless Tobacco Never   Counseling given: Not Answered   Continue to not smoke  Outpatient Encounter Medications as  of 12/10/2023  Medication Sig   acetaminophen  (TYLENOL ) 500 MG tablet Take 2 tablets (1,000 mg total) by mouth every 6 (six) hours as needed for moderate pain.   albuterol  (PROVENTIL ) (2.5 MG/3ML) 0.083% nebulizer solution Take 3 mLs (2.5 mg total) by nebulization every 6 (six) hours as needed for wheezing or shortness of breath.   albuterol  (VENTOLIN  HFA) 108 (90 Base) MCG/ACT inhaler INHALE 2 PUFFS INTO THE LUNGS EVERY 6 HOURS AS NEEDED FOR WHEEZING OR SHORTNESS OF BREATH   amLODipine -olmesartan  (AZOR ) 5-20 MG tablet Take 1 tablet by mouth daily.   baclofen  (LIORESAL ) 10 MG tablet Take 1 tablet (10 mg total) by mouth 2 (two) times daily as needed for muscle spasms.   cetirizine  (ZYRTEC ) 10 MG tablet Take 10 mg by mouth as needed for allergies or rhinitis.   Cholecalciferol (VITAMIN D3) 50 MCG (2000 UT) TABS Take 2,000 Units by mouth daily.   dicyclomine  (BENTYL ) 10 MG capsule Take 10 mg by mouth as needed.   doxycycline  (VIBRA -TABS) 100 MG tablet Take 1 tablet (100 mg total) by mouth 2 (two) times daily.   famotidine  (PEPCID ) 40 MG tablet Take 1 tablet (40 mg total) by mouth at bedtime. (Patient taking differently: Take 40 mg by mouth at bedtime as needed for heartburn or indigestion.)   ferrous sulfate  325 (65 FE) MG EC tablet Take 1 tablet (325 mg total) by mouth daily with breakfast. (Patient taking differently: Take 325 mg by mouth every other day.)   fesoterodine  (TOVIAZ ) 8 MG TB24 tablet Take 1 tablet (8 mg total) by mouth daily. (Patient taking differently: Take 8 mg by mouth at bedtime.)   fluticasone  (FLONASE ) 50 MCG/ACT nasal spray Place 2 sprays into both nostrils in the morning and at bedtime. (Patient taking differently: Place 2 sprays into both nostrils 2 (two) times daily as needed for allergies or rhinitis.)   folic acid  (FOLVITE ) 1 MG tablet Take 1 tablet (1 mg total) by mouth daily.   furosemide  (LASIX ) 20 MG tablet Take 1 tablet (20 mg total) by mouth daily as needed for edema or  fluid.   omeprazole  (PRILOSEC) 40 MG capsule TAKE 1 CAPSULE(40 MG) BY MOUTH IN THE MORNING AND AT BEDTIME (Patient taking differently: Take 40 mg by mouth 2 (two) times daily as needed (for reflux).)   ondansetron  (ZOFRAN ) 4 MG tablet Take 1 tablet (4 mg total) by mouth every 6 (six) hours as needed for nausea.   oxyCODONE  (OXY IR/ROXICODONE ) 5 MG immediate release tablet Take 1 tablet (5 mg total) by mouth every 4 (four) hours as needed (pain).   OXYGEN Inhale 2 L/min into the lungs at bedtime as needed (  for shortness of breath).   semaglutide -weight management (WEGOVY ) 1 MG/0.5ML SOAJ SQ injection Inject 1 mg into the skin once a week.   Tiotropium Bromide-Olodaterol (STIOLTO RESPIMAT ) 2.5-2.5 MCG/ACT AERS Inhale 2 puffs into the lungs daily.   triamcinolone  cream (KENALOG ) 0.1 % Apply 1 Application topically 2 (two) times daily as needed (Skin irritation.).   XARELTO  20 MG TABS tablet TAKE 1 TABLET(20 MG) BY MOUTH IN THE MORNING   No facility-administered encounter medications on file as of 12/10/2023.     Review of Systems  Review of Systems  N/a Physical Exam  BP (!) 159/84   Pulse 74   Temp 97.8 F (36.6 C) (Oral)   Ht 5' 5 (1.651 m)   Wt (!) 356 lb 3.2 oz (161.6 kg)   LMP 10/16/2013 (Approximate)   SpO2 98%   BMI 59.27 kg/m   Wt Readings from Last 5 Encounters:  12/10/23 (!) 356 lb 3.2 oz (161.6 kg)  11/26/23 (!) 356 lb 9.6 oz (161.8 kg)  11/19/23 (!) 356 lb 9.6 oz (161.8 kg)  10/29/23 (!) 353 lb 6 oz (160.3 kg)  09/28/23 (!) 363 lb 9.6 oz (164.9 kg)    BMI Readings from Last 5 Encounters:  12/10/23 59.27 kg/m  11/26/23 61.21 kg/m  11/19/23 61.21 kg/m  10/29/23 60.66 kg/m  09/28/23 59.59 kg/m     Physical Exam General: Sitting in chair, no acute distress Eyes: EOMI, no icterus Neck: Supple, no JVP Pulmonary: Clear, normal work of breathing Cardiovascular warm, no edema Abdomen: Nondistended, bowel sounds present MSK: No synovitis, no joint  effusion Neuro: Normal gait, no weakness Psych: Normal mood, full affect   Assessment & Plan:   Recurrent pneumonias: Presumably normal immune system.  Immunoglobulin levels normal.  She has a hiatal hernia and endorses coughing at night frequently.  High suspicion for silent or frank aspiration particular with lying supine related to hiatal hernia and reflux causing recurrent pneumonias and x-ray changes.  Most recent 07/2023 with resolution on subsequent chest x-ray 5/25. Has penicillin allergy.  GERD/recurrent aspiration: Continue PPI.  Counseled lifestyle modification many which she is doing related to diet and time of eating.  Recommend wedge for elevating head post on bed.  Fortunately, she had recently ordered a wedge pillow.  If this is not comfortable instructed her on how to elevate head of bed with phone that she has at home.  She has been losing weight and was congratulated on this effort.  I think ultimately symptoms will require Niesen fundoplication to see if improves.  Course of doxycycline  to have on hand in case of worsening symptoms in the interim.  Asthma: Diagnosed as a child.  Symptoms not too bad as older adult.  Does use albuterol  with coughing fits and with some shortness of breath.  Particular when she is ill with pneumonia.  She does find this beneficial.  Sample of Anoro was helpful.  Placed on Stiolto and per insurance preference, with improvement.  Continue Stiolto.  Acute hypoxemic respiratory failure: No signs of severe aspiration pneumonia.  Weaned off oxygen during the day.  Using at night as below.  Obstructive sleep apnea: Home sleep test revealed OSA.  Ordered via Dr. Neda.  Continue nocturnal oxygen for now.    Preoperative evaluation: Pulmonary medicine does not provide preoperative clearance or other preoperative risk assessment.  Based on the ARISCAT model, patient is low or 1.6% risk of postoperative pulmonary complication.  Notably this has not been  validated for endoscopy/colonoscopy.  No  modifiable risk factors to address.   Return in about 3 months (around 03/10/2024) for f/u Dr. Annella.   Donnice JONELLE Annella, MD 12/10/2023

## 2023-12-10 NOTE — Patient Instructions (Signed)
 Nice to see you again  I sent a prescription for doxycycline  1 tablet twice a day for 7 days to have on hand if you feel like pneumonia is sitting in again  It is okay to take Delsym  or dextromethorphan  at night an hour or 2 before bed to see if this helps with the cough.  Read the dosing instructions and take as recommended.  I hope the colonoscopy and endoscopy go well!  Return to clinic in 3 months or sooner as needed with Dr. Annella

## 2023-12-16 ENCOUNTER — Telehealth: Payer: Self-pay | Admitting: Gastroenterology

## 2023-12-16 NOTE — Telephone Encounter (Addendum)
 Procedure:Colonoscopy/Endoscopy Procedure date: 12/24/23 Procedure location: [facility] Arrival Time: 7:15 am Spoke with the patient Y/N: Yes Any prep concerns? No  Has the patient obtained the prep from the pharmacy ? Yes Do you have a care partner and transportation: Yes Any additional concerns? No

## 2023-12-17 ENCOUNTER — Encounter (HOSPITAL_COMMUNITY): Payer: Self-pay | Admitting: Gastroenterology

## 2023-12-17 ENCOUNTER — Other Ambulatory Visit: Payer: Self-pay | Admitting: Family Medicine

## 2023-12-24 ENCOUNTER — Ambulatory Visit (HOSPITAL_COMMUNITY): Admitting: Anesthesiology

## 2023-12-24 ENCOUNTER — Encounter (HOSPITAL_COMMUNITY)
Admission: RE | Disposition: A | Discharge status: Home / Self Care | Payer: Self-pay | Source: Home / Self Care | Attending: Gastroenterology

## 2023-12-24 ENCOUNTER — Ambulatory Visit (HOSPITAL_COMMUNITY)
Admission: RE | Admit: 2023-12-24 | Discharge: 2023-12-24 | Disposition: A | Discharge status: Home / Self Care | Attending: Gastroenterology | Admitting: Gastroenterology

## 2023-12-24 ENCOUNTER — Encounter (HOSPITAL_COMMUNITY): Payer: Self-pay | Admitting: Gastroenterology

## 2023-12-24 ENCOUNTER — Other Ambulatory Visit: Payer: Self-pay

## 2023-12-24 DIAGNOSIS — K644 Residual hemorrhoidal skin tags: Secondary | ICD-10-CM | POA: Insufficient documentation

## 2023-12-24 DIAGNOSIS — K2950 Unspecified chronic gastritis without bleeding: Secondary | ICD-10-CM | POA: Diagnosis not present

## 2023-12-24 DIAGNOSIS — K449 Diaphragmatic hernia without obstruction or gangrene: Secondary | ICD-10-CM | POA: Insufficient documentation

## 2023-12-24 DIAGNOSIS — Z860100 Personal history of colon polyps, unspecified: Secondary | ICD-10-CM

## 2023-12-24 DIAGNOSIS — K2100 Gastro-esophageal reflux disease with esophagitis, without bleeding: Secondary | ICD-10-CM | POA: Diagnosis not present

## 2023-12-24 DIAGNOSIS — I5032 Chronic diastolic (congestive) heart failure: Secondary | ICD-10-CM | POA: Diagnosis not present

## 2023-12-24 DIAGNOSIS — K219 Gastro-esophageal reflux disease without esophagitis: Secondary | ICD-10-CM

## 2023-12-24 DIAGNOSIS — Z7901 Long term (current) use of anticoagulants: Secondary | ICD-10-CM | POA: Diagnosis not present

## 2023-12-24 DIAGNOSIS — K317 Polyp of stomach and duodenum: Secondary | ICD-10-CM | POA: Diagnosis not present

## 2023-12-24 DIAGNOSIS — Z1211 Encounter for screening for malignant neoplasm of colon: Secondary | ICD-10-CM | POA: Diagnosis not present

## 2023-12-24 DIAGNOSIS — I130 Hypertensive heart and chronic kidney disease with heart failure and stage 1 through stage 4 chronic kidney disease, or unspecified chronic kidney disease: Secondary | ICD-10-CM | POA: Diagnosis not present

## 2023-12-24 DIAGNOSIS — Z86711 Personal history of pulmonary embolism: Secondary | ICD-10-CM | POA: Insufficient documentation

## 2023-12-24 DIAGNOSIS — Z86718 Personal history of other venous thrombosis and embolism: Secondary | ICD-10-CM | POA: Insufficient documentation

## 2023-12-24 DIAGNOSIS — Z635 Disruption of family by separation and divorce: Secondary | ICD-10-CM | POA: Insufficient documentation

## 2023-12-24 DIAGNOSIS — K648 Other hemorrhoids: Secondary | ICD-10-CM | POA: Insufficient documentation

## 2023-12-24 DIAGNOSIS — I110 Hypertensive heart disease with heart failure: Secondary | ICD-10-CM | POA: Diagnosis not present

## 2023-12-24 DIAGNOSIS — D122 Benign neoplasm of ascending colon: Secondary | ICD-10-CM | POA: Diagnosis not present

## 2023-12-24 DIAGNOSIS — K5730 Diverticulosis of large intestine without perforation or abscess without bleeding: Secondary | ICD-10-CM

## 2023-12-24 DIAGNOSIS — Z5986 Financial insecurity: Secondary | ICD-10-CM | POA: Insufficient documentation

## 2023-12-24 DIAGNOSIS — N183 Chronic kidney disease, stage 3 unspecified: Secondary | ICD-10-CM | POA: Diagnosis not present

## 2023-12-24 HISTORY — PX: ESOPHAGOGASTRODUODENOSCOPY: SHX5428

## 2023-12-24 HISTORY — PX: COLONOSCOPY: SHX5424

## 2023-12-24 SURGERY — COLONOSCOPY
Anesthesia: Monitor Anesthesia Care

## 2023-12-24 MED ORDER — LACTATED RINGERS IV SOLN: INTRAVENOUS | Status: DC | PRN

## 2023-12-24 MED ORDER — DEXMEDETOMIDINE HCL IN NACL 80 MCG/20ML IV SOLN
INTRAVENOUS | Status: DC | PRN
Start: 2023-12-24 — End: 2023-12-24
  Administered 2023-12-24: 12 ug via INTRAVENOUS

## 2023-12-24 MED ORDER — SODIUM CHLORIDE 0.9 % IV SOLN: INTRAVENOUS | Status: DC

## 2023-12-24 MED ORDER — PROPOFOL 500 MG/50ML IV EMUL
INTRAVENOUS | Status: DC | PRN
  Administered 2023-12-24 (×3): 30 mg via INTRAVENOUS
  Administered 2023-12-24: 75 ug/kg/min via INTRAVENOUS

## 2023-12-24 NOTE — Op Note (Signed)
 [facility] Patient Name: [name] Procedure Date: 12/24/2023 MRN: [id number] Attending MD: Gustav ALONSO Mcgee , MD, [id number] Date of Birth: 07/22/1965 CSN: [id number] Age: 58 Admit Type: Outpatient Procedure:                Upper GI endoscopy Indications:              Epigastric abdominal pain, Esophageal reflux                            symptoms that persist despite appropriate therapy Providers:                Gustav ALONSO Mcgee, MD, Hoy Penner, RN, Corene Southgate, Technician Referring MD:              Medicines:                Monitored Anesthesia Care Complications:            No immediate complications. Estimated Blood Loss:     Estimated blood loss was minimal. Procedure:                Pre-Anesthesia Assessment:                           - Prior to the procedure, a History and Physical                            was performed, and patient medications and                            allergies were reviewed. The patient's tolerance of                            previous anesthesia was also reviewed. The risks                            and benefits of the procedure and the sedation                            options and risks were discussed with the patient.                            All questions were answered, and informed consent                            was obtained. Prior Anticoagulants: The patient has                            taken no anticoagulant or antiplatelet agents. ASA                            Grade Assessment: III - A patient with severe  systemic disease. After reviewing the risks and                            benefits, the patient was deemed in satisfactory                            condition to undergo the procedure.                           After obtaining informed consent, the endoscope was                            passed under direct vision. Throughout the                             procedure, the patient's blood pressure, pulse, and                            oxygen saturations were monitored continuously. The                            [id number]-[id number] ([id number]) Olympus endoscope was introduced                            through the mouth, and advanced to the second part                            of duodenum. The upper GI endoscopy was                            accomplished without difficulty. The patient                            tolerated the procedure well. Scope In: Scope Out: Findings:      The Z-line was regular and was found 38 cm from the incisors.      A 5 cm hiatal hernia was present.      A few 3 to 7 mm sessile polyps were found in the cardia, in the gastric       fundus and in the gastric body. Biopsies were taken with a cold forceps       for histology.      Patchy mild inflammation characterized by congestion (edema), erythema       and friability was found in the entire examined stomach.      The cardia and gastric fundus were normal on retroflexion.      The examined duodenum was normal. Impression:               - Z-line regular, 38 cm from the incisors.                           - 5 cm hiatal hernia.                           - A few gastric polyps. Biopsied.                           -  Gastritis.                           - Normal examined duodenum. Moderate Sedation:      N/A Recommendation:           - Resume previous diet.                           - Continue present medications.                           - Await pathology results.                           - Follow an antireflux regimen.                           - Resume Xarelto  (rivaroxaban ) at prior dose                            tomorrow. Refer to managing physician for further                            adjustment of therapy. Procedure Code(s):        --- Professional ---                           [phone], Esophagogastroduodenoscopy, flexible,                             transoral; with biopsy, single or multiple Diagnosis Code(s):        --- Professional ---                           K44.9, Diaphragmatic hernia without obstruction or                            gangrene                           K31.7, Polyp of stomach and duodenum                           K29.70, Gastritis, unspecified, without bleeding                           R10.13, Epigastric pain                           K21.9, Gastro-esophageal reflux disease without                            esophagitis CPT copyright 2022 American Medical Association. All rights reserved. The codes documented in this report are preliminary and upon coder review may  be revised to meet current compliance requirements. Alison Yerby V. [name], MD 12/24/2023 10:15:23 AM This report has been signed electronically. Number of Addenda: 0

## 2023-12-24 NOTE — Op Note (Signed)
 [facility] Patient Name: [name] Procedure Date: 12/24/2023 MRN: [id number] Attending MD: [name] , MD, [id number] Date of Birth: 01-23-1966 CSN: [id number] Age: 58 Admit Type: Outpatient Procedure:                Colonoscopy Indications:              High risk colon cancer surveillance: Personal                            history of colonic polyps, , High risk colon cancer                            surveillance: Personal history of adenoma less than                            10 mm in size. Inadequate bowel prep on previous                            colonoscopy Providers:                Alison ALONSO Mcgee, MD, Alison Penner, RN, [name], Technician Referring MD:              Medicines:                Monitored Anesthesia Care Complications:            No immediate complications. Estimated Blood Loss:     Estimated blood loss was minimal. Procedure:                Pre-Anesthesia Assessment:                           - Prior to the procedure, a History and Physical                            was performed, and patient medications and                            allergies were reviewed. The patient's tolerance of                            previous anesthesia was also reviewed. The risks                            and benefits of the procedure and the sedation                            options and risks were discussed with the patient.                            All questions were answered, and informed consent  was obtained. Prior Anticoagulants: The patient                            last took Xarelto  (rivaroxaban ) 2 days prior to the                            procedure. ASA Grade Assessment: III - A patient                            with severe systemic disease. After reviewing the                            risks and benefits, the patient was deemed in                             satisfactory condition to undergo the procedure.                           After obtaining informed consent, the colonoscope                            was passed under direct vision. Throughout the                            procedure, the patient's blood pressure, pulse, and                            oxygen saturations were monitored continuously. The                            [id number]-[id number] ([id number] Olympus colonscope was                            introduced through the anus and advanced to the the                            cecum, identified by appendiceal orifice and                            ileocecal valve. The colonoscopy was technically                            difficult and complex due to inadequate bowel prep,                            the patient's body habitus and ineffective                            sedation. The patient tolerated the procedure well.                            The quality of the bowel preparation was fair. The  ileocecal valve, appendiceal orifice, and rectum                            were photographed. Scope In: 9:21:16 AM Scope Out: 9:46:58 AM Scope Withdrawal Time: 0 hours 7 minutes 32 seconds  Total Procedure Duration: 0 hours 25 minutes 42 seconds  Findings:      The perianal and digital rectal examinations were normal.      A 3 mm polyp was found in the ascending colon. The polyp was sessile.       The polyp was removed with a cold snare. Resection and retrieval were       complete.      A few small-mouthed diverticula were found in the sigmoid colon.      Non-bleeding external and internal hemorrhoids were found during       retroflexion. The hemorrhoids were small. Impression:               - Preparation of the colon was fair.                           - One 3 mm polyp in the ascending colon, removed                            with a cold snare. Resected and retrieved.                           - Diverticulosis in  the sigmoid colon.                           - Non-bleeding external and internal hemorrhoids. Moderate Sedation:      N/A Recommendation:           - Resume previous diet.                           - Continue present medications.                           - Await pathology results.                           - Repeat colonoscopy in 5 years for surveillance                            based on pathology results.                           - For future colonoscopy the patient will require                            an extended preparation. If there are any                            questions, please contact the gastroenterologist. Procedure Code(s):        --- Professional ---  54614, Colonoscopy, flexible; with removal of                            tumor(s), polyp(s), or other lesion(s) by snare                            technique Diagnosis Code(s):        --- Professional ---                           K64.8, Other hemorrhoids                           D12.2, Benign neoplasm of ascending colon                           Z86.010, Personal history of colonic polyps                           K57.30, Diverticulosis of large intestine without                            perforation or abscess without bleeding CPT copyright 2022 American Medical Association. All rights reserved. The codes documented in this report are preliminary and upon coder review may  be revised to meet current compliance requirements. [name] V. Nana Hoselton, MD 12/24/2023 10:04:52 AM This report has been signed electronically. Number of Addenda: 0

## 2023-12-24 NOTE — Anesthesia Postprocedure Evaluation (Signed)
 Anesthesia Post Note  Patient: [name]  Procedure(s) Performed: COLONOSCOPY EGD (ESOPHAGOGASTRODUODENOSCOPY)     Patient location during evaluation: PACU Anesthesia Type: MAC Level of consciousness: awake and alert Pain management: pain level controlled Vital Signs Assessment: post-procedure vital signs reviewed and stable Respiratory status: spontaneous breathing, nonlabored ventilation, respiratory function stable and patient connected to nasal cannula oxygen Cardiovascular status: stable and blood pressure returned to baseline Postop Assessment: no apparent nausea or vomiting Anesthetic complications: no   No notable events documented.  Last Vitals:  Vitals:   12/24/23 1000 12/24/23 1010  BP: (!) 102/53 131/78  Pulse: 82 76  Resp: 19 20  Temp:    SpO2: 99% 98%    Last Pain:  Vitals:   12/24/23 1010  TempSrc:   PainSc: 0-No pain                 Cordella P Karuna Balducci

## 2023-12-24 NOTE — Discharge Instructions (Signed)

## 2023-12-24 NOTE — H&P (Signed)
 [facility] History and Physical   Primary Care Physician:  Donah Laymon PARAS, MD   Reason for Procedure:  History of adenomatous colon polyp, GERD, hiatal hernia, epigastric pain  Plan:    EGD and colonoscopy with possible interventions as needed     HPI: [name] is a very pleasant 58 y.o. female here for EGD and colonoscopy for History of adenomatous colon polyp, GERD, hiatal hernia, epigastric pain. S/p lap chole for cholelithiasis. On Xarelto  for DVT and PE  The risks and benefits as well as alternatives of endoscopic procedure(s) have been discussed and reviewed. All questions answered. The patient agrees to proceed.    Past Medical History:  Diagnosis Date   Allergy    Anemia    Anxiety    Arthritis    Aspiration pneumonia (HCC)    Blood transfusion without reported diagnosis    Cough    Cough 06/11/2018   Difficulty sleeping 06/11/2018   Diverticulitis with perforation 2010   DVT (deep vein thrombosis) in pregnancy 11/24/2016   DVT, lower extremity (HCC)    Endometrial cancer (HCC)    Enlarged lymph nodes 08/13/2017   Family history of adverse reaction to anesthesia    pts daughter had difficulty awakening following anesthesia, long time to wake up   GERD (gastroesophageal reflux disease)    Headache    Hemoptysis 04/22/2018   History of bronchitis    History of ear infections    Hospital discharge follow-up 03/02/2019   Hx of migraines    Hyperlipidemia    Hypertension    IBS (irritable bowel syndrome)    Kidney infection    Lupus (systemic lupus erythematosus) (HCC) 02/2017   Morbid obesity (HCC)    Neck pain 01/13/2019   Ovarian cancer (HCC) dx'd 01/2015   PONV (postoperative nausea and vomiting)    Port-A-Cath in place    Right side   Portacath in place 09/12/2015   Pre-diabetes    Pyelonephritis    Right knee pain 11/28/2011   Scleroderma (HCC)    Screen for colon cancer 03/02/2019   Slow rate of speech 08/22/2015    Chronic from CVA.  She also has loss of left nasolabial fold and slight left facial droop/small asymmetry on the left side secondary to CVA   UTI (urinary tract infection) 05/07/2018    Past Surgical History:  Procedure Laterality Date   ABDOMINAL HYSTERECTOMY N/A 03/13/2015   Procedure: TOTAL HYSTERECTOMY ABDOMINAL BILATERAL SALPINGO OOPHORECTOMY RADICAL TUMOR DEBUKING;  Surgeon: Maurilio Ship, MD;  Location: [facility];  Service: Gynecology;  Laterality: N/A;   BOWEL RESECTION  03/13/2015   Procedure: SMALL BOWEL RESECTION;  Surgeon: Maurilio Ship, MD;  Location: [facility];  Service: Gynecology;;   CESAREAN SECTION  1983 and 1984   CHOLECYSTECTOMY N/A 11/26/2023   Procedure: LAPAROSCOPIC CHOLECYSTECTOMY;  Surgeon: Tanda Locus, MD;  Location: [facility];  Service: General;  Laterality: N/A;   COLONOSCOPY WITH PROPOFOL  N/A 07/19/2020   Procedure: COLONOSCOPY WITH PROPOFOL ;  Surgeon: Legrand Victory LITTIE DOUGLAS, MD;  Location: [facility];  Service: Gastroenterology;  Laterality: N/A;   COLOSTOMY  04/07/2006   COLOSTOMY TAKEDOWN     ESOPHAGOGASTRODUODENOSCOPY (EGD) WITH PROPOFOL  N/A 12/21/2018   Procedure: ESOPHAGOGASTRODUODENOSCOPY (EGD) WITH PROPOFOL ;  Surgeon: Legrand Victory LITTIE DOUGLAS, MD;  Location: [facility];  Service: Gastroenterology;  Laterality: N/A;   HEMOSTASIS CLIP PLACEMENT  07/19/2020   Procedure: HEMOSTASIS CLIP PLACEMENT;  Surgeon: Legrand Victory LITTIE DOUGLAS, MD;  Location: [facility];  Service: Gastroenterology;;  IR REMOVAL TUN ACCESS W/ PORT W/O FL MOD SED  12/17/2022   LAPAROSCOPIC LYSIS OF ADHESIONS N/A 11/26/2023   Procedure: LYSIS, ADHESIONS, LAPAROSCOPIC;  Surgeon: Tanda Locus, MD;  Location: [facility];  Service: General;  Laterality: N/A;   LAPAROTOMY N/A 03/13/2015   Procedure: EXPLORATORY LAPAROTOMY;  Surgeon: Maurilio Ship, MD;  Location: [facility];  Service: Gynecology;  Laterality: N/A;   POLYPECTOMY  07/19/2020   Procedure: POLYPECTOMY;  Surgeon: Legrand Victory LITTIE DOUGLAS, MD;  Location: [facility];   Service: Gastroenterology;;   TONSILLECTOMY AND ADENOIDECTOMY  04/08/1995   VENTRAL HERNIA REPAIR  03/13/2015   Procedure: HERNIA REPAIR VENTRAL ADULT;  Surgeon: Maurilio Ship, MD;  Location: [facility];  Service: Gynecology;;    Prior to Admission medications   Medication Sig Start Date End Date Taking? Authorizing Provider  acetaminophen  (TYLENOL ) 500 MG tablet Take 2 tablets (1,000 mg total) by mouth every 6 (six) hours as needed for moderate pain. 03/14/21  Yes Rosendo Rush, MD  albuterol  (PROVENTIL ) (2.5 MG/3ML) 0.083% nebulizer solution Take 3 mLs (2.5 mg total) by nebulization every 6 (six) hours as needed for wheezing or shortness of breath. 08/21/22  Yes Alba Sharper, MD  amLODipine -olmesartan  (AZOR ) 5-20 MG tablet Take 1 tablet by mouth daily. 08/24/23  Yes Acharya, Gayatri A, MD  Cholecalciferol (VITAMIN D3) 50 MCG (2000 UT) TABS Take 2,000 Units by mouth daily.   Yes [provider]  dicyclomine  (BENTYL ) 10 MG capsule Take 10 mg by mouth as needed. 09/11/20  Yes [provider]  ferrous sulfate  325 (65 FE) MG EC tablet Take 1 tablet (325 mg total) by mouth daily with breakfast. Patient taking differently: Take 325 mg by mouth every other day. 01/19/23  Yes Kennedy-Smith, Colleen M, NP  fesoterodine  (TOVIAZ ) 8 MG TB24 tablet TAKE 1 TABLET(8 MG) BY MOUTH DAILY 12/19/23  Yes Donah Laymon PARAS, MD  folic acid  (FOLVITE ) 1 MG tablet Take 1 tablet (1 mg total) by mouth daily. 02/16/23  Yes Pray, Rollene BRAVO, MD  omeprazole  (PRILOSEC) 40 MG capsule TAKE 1 CAPSULE(40 MG) BY MOUTH IN THE MORNING AND AT BEDTIME Patient taking differently: Take 40 mg by mouth 2 (two) times daily as needed (for reflux). 11/08/23  Yes Cara Elida HERO, NP  OXYGEN Inhale 2 L/min into the lungs at bedtime as needed (for shortness of breath).   Yes [provider]  Tiotropium Bromide-Olodaterol (STIOLTO RESPIMAT ) 2.5-2.5 MCG/ACT AERS Inhale 2 puffs into the lungs daily. 04/29/23  Yes  Hunsucker, Donnice SAUNDERS, MD  albuterol  (VENTOLIN  HFA) 108 (90 Base) MCG/ACT inhaler INHALE 2 PUFFS INTO THE LUNGS EVERY 6 HOURS AS NEEDED FOR WHEEZING OR SHORTNESS OF BREATH 04/02/23   Rumball, Alison M, DO  baclofen  (LIORESAL ) 10 MG tablet Take 1 tablet (10 mg total) by mouth 2 (two) times daily as needed for muscle spasms. 07/15/23   Donah Laymon PARAS, MD  cetirizine  (ZYRTEC ) 10 MG tablet Take 10 mg by mouth as needed for allergies or rhinitis.    [provider]  doxycycline  (VIBRA -TABS) 100 MG tablet Take 1 tablet (100 mg total) by mouth 2 (two) times daily. 12/10/23   Hunsucker, Donnice SAUNDERS, MD  famotidine  (PEPCID ) 40 MG tablet Take 1 tablet (40 mg total) by mouth at bedtime. Patient taking differently: Take 40 mg by mouth at bedtime as needed for heartburn or indigestion. 10/29/23   Zehr, Jessica D, PA-C  fluticasone  (FLONASE ) 50 MCG/ACT nasal spray Place 2 sprays into both nostrils in the morning and at bedtime.  Patient taking differently: Place 2 sprays into both nostrils 2 (two) times daily as needed for allergies or rhinitis. 07/22/22   Theophilus Pagan, MD  furosemide  (LASIX ) 20 MG tablet Take 1 tablet (20 mg total) by mouth daily as needed for edema or fluid. 09/17/22   Austin Ade, MD  ondansetron  (ZOFRAN ) 4 MG tablet Take 1 tablet (4 mg total) by mouth every 6 (six) hours as needed for nausea. 06/27/22   Will Almarie MATSU, MD  oxyCODONE  (OXY IR/ROXICODONE ) 5 MG immediate release tablet Take 1 tablet (5 mg total) by mouth every 4 (four) hours as needed (pain). 11/27/23   Tammy Sor, PA-C  semaglutide -weight management (WEGOVY ) 1 MG/0.5ML SOAJ SQ injection Inject 1 mg into the skin once a week. 12/02/23   Donah Laymon PARAS, MD  triamcinolone  cream (KENALOG ) 0.1 % Apply 1 Application topically 2 (two) times daily as needed (Skin irritation.). 12/01/23   Donah Laymon PARAS, MD  XARELTO  20 MG TABS tablet TAKE 1 TABLET(20 MG) BY MOUTH IN THE MORNING 11/13/23   Donah Laymon PARAS, MD     Current Facility-Administered Medications  Medication Dose Route Frequency Provider Last Rate Last Admin   0.9 %  sodium chloride  infusion   Intravenous Continuous Zehr, Jessica D, PA-C        Allergies as of 10/30/2023 - Review Complete 10/29/2023  Allergen Reaction Noted   Tape Itching 01/13/2023   Penicillins Rash 06/09/2011    Family History  Problem Relation Age of Onset   Diabetes Mother    Hypertension Mother    Hyperlipidemia Mother    Colon polyps Mother        4 total   Fibroids Mother        s/p hysterectomy   Hyperlipidemia Father    Lung cancer Father    Kidney failure Maternal Grandmother 40   Pancreatic cancer Paternal Grandmother        dx. >50   Diabetes Paternal Grandfather    Diabetes Daughter    Hodgkin's lymphoma Daughter 31   Asthma Maternal Aunt        severe - d. 24   Prostate cancer Maternal Uncle 25   Diabetes Paternal Aunt    Diabetes Paternal Aunt    Diabetes Paternal Uncle    Heart disease Neg Hx    Stroke Neg Hx    Colon cancer Neg Hx    Stomach cancer Neg Hx    Esophageal cancer Neg Hx    Rectal cancer Neg Hx     Social History   Socioeconomic History   Marital status: Legally Separated    Spouse name: Not on file   Number of children: Not on file   Years of education: Not on file   Highest education level: Some college, no degree  Occupational History   Not on file  Tobacco Use   Smoking status: Never   Smokeless tobacco: Never  Vaping Use   Vaping status: Never Used  Substance and Sexual Activity   Alcohol use: Yes    Comment: Maybe wine every 6 months   Drug use: No   Sexual activity: Yes    Partners: Male    Comment: Told she could not have more children in 1986  Other Topics Concern   Not on file  Social History Narrative   Works part time at [employer] (13 years as of 2015) - former Merchandiser, retail, 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   Married x 23 years, recently  separated (4/15).  No domestic  violence.  + financial stress.     Lives previously with husband who moved out, now with daughter who has medical problems, including Hodgkin's disease, and granddaughter (age 30).     Uses seat belt.    Social Drivers of Corporate investment banker Strain: Low Risk  (11/15/2023)   Overall Financial Resource Strain (CARDIA)    Difficulty of Paying Living Expenses: Not very hard  Food Insecurity: No Food Insecurity (11/25/2023)   Hunger Vital Sign    Worried About Running Out of Food in the Last Year: Never true    Ran Out of Food in the Last Year: Never true  Transportation Needs: No Transportation Needs (11/25/2023)   PRAPARE - Administrator, Civil Service (Medical): No    Lack of Transportation (Non-Medical): No  Physical Activity: Sufficiently Active (11/15/2023)   Exercise Vital Sign    Days of Exercise per Week: 4 days    Minutes of Exercise per Session: 40 min  Recent Concern: Physical Activity - Insufficiently Active (08/26/2023)   [facility]   Exercise Vital Sign    On average, how many days per week do you engage in moderate to strenuous exercise (like a brisk walk)?: 4 days    On average, how many minutes do you engage in exercise at this level?: 30 min  Stress: No Stress Concern Present (11/15/2023)   [employer] of Occupational Health - Occupational Stress Questionnaire    Feeling of Stress: Only a little  Social Connections: Socially Integrated (11/15/2023)   Social Connection and Isolation Panel    Frequency of Communication with Friends and Family: More than three times a week    Frequency of Social Gatherings with Friends and Family: Once a week    Attends Religious Services: More than 4 times per year    Active Member of [employer] or Organizations: Yes    Attends Engineer, structural: More than 4 times per year    Marital Status: Married  Catering manager Violence: Not At Risk (11/25/2023)   Humiliation, Afraid, Rape, and Kick  questionnaire    Fear of Current or Ex-Partner: No    Emotionally Abused: No    Physically Abused: No    Sexually Abused: No    Review of Systems:  All other review of systems negative except as mentioned in the HPI.  Physical Exam: Vital signs in last 24 hours: BP (!) 153/69   Pulse 82   Temp 97.8 F (36.6 C) (Temporal)   Resp 16   Ht 5' 5 (1.651 m)   Wt (!) 159.2 kg   LMP 10/16/2013 (Approximate)   SpO2 99%   BMI 58.41 kg/m  General:   Alert, NAD Lungs:  Clear .   Heart:  Regular rate and rhythm Abdomen:  Soft, nontender and nondistended. Neuro/Psych:  Alert and cooperative. Normal mood and affect. A and O x 3  Reviewed labs, radiology imaging, old records and pertinent past GI work up  Patient is appropriate for planned procedure(s) and anesthesia in an ambulatory setting   K. Veena Chania Kochanski , MD [phone]

## 2023-12-24 NOTE — Anesthesia Preprocedure Evaluation (Addendum)
 Anesthesia Evaluation  Patient identified by MRN, date of birth, ID band Patient awake    Reviewed: Allergy & Precautions, NPO status , Patient's Chart, lab work & pertinent test results  History of Anesthesia Complications (+) PONV, Family history of anesthesia reaction and history of anesthetic complications  Airway Mallampati: III       Dental no notable dental hx.    Pulmonary sleep apnea    Pulmonary exam normal        Cardiovascular hypertension, Pt. on medications +CHF and + DVT   Rhythm:Regular Rate:Normal     Neuro/Psych  Headaches  Anxiety        GI/Hepatic Neg liver ROS, hiatal hernia,GERD  Medicated,,  Endo/Other    Class 3 obesity  Renal/GU negative Renal ROS  negative genitourinary   Musculoskeletal  (+) Arthritis , Osteoarthritis,    Abdominal Normal abdominal exam  (+)   Peds  Hematology  (+) Blood dyscrasia, anemia Lab Results      Component                Value               Date                      WBC                      8.8                 11/27/2023                HGB                      11.4 (L)            11/27/2023                HCT                      36.6                11/27/2023                MCV                      93.1                11/27/2023                PLT                      192                 11/27/2023              Anesthesia Other Findings   Reproductive/Obstetrics                              Anesthesia Physical Anesthesia Plan  ASA: 3  Anesthesia Plan: MAC   Post-op Pain Management:    Induction: Intravenous  PONV Risk Score and Plan: 3 and Propofol  infusion and Treatment may vary due to age or medical condition  Airway Management Planned: Simple Face Mask and Nasal Cannula  Additional Equipment: None  Intra-op Plan:   Post-operative Plan:   Informed Consent: I have reviewed the patients History and Physical, chart,  labs and discussed the procedure including the risks, benefits and alternatives for the proposed anesthesia with the patient or authorized representative who has indicated his/her understanding and acceptance.     Dental advisory given  Plan Discussed with: CRNA  Anesthesia Plan Comments:         Anesthesia Quick Evaluation

## 2023-12-24 NOTE — Transfer of Care (Signed)
 Immediate Anesthesia Transfer of Care Note  Patient: [name]  Procedure(s) Performed: Procedure(s): COLONOSCOPY (N/A) EGD (ESOPHAGOGASTRODUODENOSCOPY) (N/A)  Patient Location: PACU and Endoscopy Unit  Anesthesia Type:MAC  Level of Consciousness: awake, alert  and oriented  Airway & Oxygen Therapy: Patient Spontanous Breathing and Patient connected to nasal cannula oxygen  Post-op Assessment: Report given to RN and Post -op Vital signs reviewed and stable  Post vital signs: Reviewed and stable  Last Vitals:  Vitals:   12/24/23 0728  BP: (!) 153/69  Pulse: 82  Resp: 16  Temp: 36.6 C  SpO2: 99%    Complications: No apparent anesthesia complications

## 2023-12-25 ENCOUNTER — Encounter (HOSPITAL_COMMUNITY): Payer: Self-pay | Admitting: Gastroenterology

## 2023-12-25 LAB — SURGICAL PATHOLOGY

## 2023-12-29 DIAGNOSIS — N183 Chronic kidney disease, stage 3 unspecified: Secondary | ICD-10-CM | POA: Diagnosis not present

## 2023-12-29 DIAGNOSIS — Z6841 Body Mass Index (BMI) 40.0 and over, adult: Secondary | ICD-10-CM | POA: Diagnosis not present

## 2023-12-29 DIAGNOSIS — I10 Essential (primary) hypertension: Secondary | ICD-10-CM | POA: Diagnosis not present

## 2023-12-29 DIAGNOSIS — E66813 Obesity, class 3: Secondary | ICD-10-CM | POA: Diagnosis not present

## 2023-12-29 DIAGNOSIS — K449 Diaphragmatic hernia without obstruction or gangrene: Secondary | ICD-10-CM | POA: Diagnosis not present

## 2023-12-29 DIAGNOSIS — K219 Gastro-esophageal reflux disease without esophagitis: Secondary | ICD-10-CM | POA: Diagnosis not present

## 2023-12-29 DIAGNOSIS — G4733 Obstructive sleep apnea (adult) (pediatric): Secondary | ICD-10-CM | POA: Diagnosis not present

## 2024-01-04 ENCOUNTER — Encounter: Payer: Self-pay | Admitting: Family Medicine

## 2024-01-04 ENCOUNTER — Ambulatory Visit: Admitting: Family Medicine

## 2024-01-04 ENCOUNTER — Other Ambulatory Visit: Payer: Self-pay | Admitting: Nurse Practitioner

## 2024-01-04 VITALS — BP 129/73 | HR 83 | Ht 65.0 in | Wt 350.0 lb

## 2024-01-04 DIAGNOSIS — Z0184 Encounter for antibody response examination: Secondary | ICD-10-CM

## 2024-01-04 DIAGNOSIS — Z7901 Long term (current) use of anticoagulants: Secondary | ICD-10-CM | POA: Diagnosis not present

## 2024-01-04 DIAGNOSIS — Z23 Encounter for immunization: Secondary | ICD-10-CM

## 2024-01-04 DIAGNOSIS — Z09 Encounter for follow-up examination after completed treatment for conditions other than malignant neoplasm: Secondary | ICD-10-CM | POA: Diagnosis not present

## 2024-01-04 DIAGNOSIS — Z0000 Encounter for general adult medical examination without abnormal findings: Secondary | ICD-10-CM

## 2024-01-04 NOTE — Patient Instructions (Addendum)
 It was great to see you again today.  Checking liver function and hepatitis B immunity today  Flu shot today  If liver function looks good will send in higher dose of wegovy   Follow up in 1 month  Be well, Dr. Donah

## 2024-01-04 NOTE — Progress Notes (Signed)
"  °  Date of Visit: 01/04/2024   SUBJECTIVE:   HPI:  Discussed the use of AI scribe software for clinical note transcription with the patient, who gave verbal consent to proceed.  History of Present Illness [name] is a 58 year old female who presents for follow-up after recent gallbladder surgery and to discuss bariatric surgery.  Post-cholecystectomy status - Underwent cholecystectomy recently - Postoperative pain persisted for a few days - Has resumed walking in the pool twice since surgery, pain overall controlled - desires recheck of LFTs today  Obesity and bariatric surgery consideration - Considering bariatric surgery and has concerns regarding timing and safety of the procedure - History of aspiration pneumonia, which has been discussed in relation to surgical risk - Currently on Wegovy  1 mg weekly for weight management; considering increasing dose to 1.7 mg pending laboratory results - No history of pancreatitis - Weight has decreased slightly - Plans to continue pool walking to aid in weight loss  Hepatic enzyme elevation - Elevated liver enzymes noted during recent surgery - Requests repeat liver enzyme testing  Sleep apnea - Diagnosed with sleep apnea - Uses oxygen at night  Iron supplementation - Discontinued iron supplements due to previously elevated iron levels - Awaiting further guidance regarding iron supplementation   OBJECTIVE:   BP 129/73   Pulse 83   Ht 5' 5 (1.651 m)   Wt (!) 350 lb (158.8 kg)   LMP 10/16/2013 (Approximate)   SpO2 100%   BMI 58.24 kg/m  Gen: no acute distress, pleasant cooperative well appearing HEENT: normocephalic, atraumatic  Heart: regular rate and rhythm, no murmur Lungs: clear to auscultation bilaterally, normal work of breathing  Neuro: alert grossly nonfocal speech normal Ext: No appreciable lower extremity edema bilaterally   ASSESSMENT/PLAN:   Assessment & Plan Hospital discharge follow-up Doing  well after surgery Update LFTs to ensure normalization Chronic anticoagulation Update CBC today to ensure stability after recent surgery Morbid obesity (HCC) Morbid obesity with ongoing weight management. Considering bariatric surgery but concerned about safety due to previous surgeries and scar tissue. Interested in increasing Wegovy  dose pending lab results. Plans to discuss surgical team's stance on surgery before weight goal. - Check liver function tests before increasing Wegovy  dose. - Continue Wegovy  1 mg weekly until lab results are reviewed, then increase to 1.7mg  weekly if stable - Discuss bariatric surgery options and safety concerns with the surgical team. Routine adult health maintenance Flu shot today Declines COVID vaccine and shingrix  today Hep B surface antibody titer ordered   FOLLOW UP: Follow up in 1 mo for above issues  Kento Gossman J. Donah, MD [facility] "

## 2024-01-05 ENCOUNTER — Ambulatory Visit: Payer: Self-pay | Admitting: Family Medicine

## 2024-01-05 LAB — IRON,TIBC AND FERRITIN PANEL
Ferritin: 63 ng/mL (ref 15–150)
Iron Saturation: 16 % (ref 15–55)
Iron: 57 ug/dL (ref 27–159)
Total Iron Binding Capacity: 363 ug/dL (ref 250–450)
UIBC: 306 ug/dL (ref 131–425)

## 2024-01-05 LAB — CBC
Hematocrit: 38.8 % (ref 34.0–46.6)
Hemoglobin: 12.6 g/dL (ref 11.1–15.9)
MCH: 29.9 pg (ref 26.6–33.0)
MCHC: 32.5 g/dL (ref 31.5–35.7)
MCV: 92 fL (ref 79–97)
Platelets: 204 x10E3/uL (ref 150–450)
RBC: 4.21 x10E6/uL (ref 3.77–5.28)
RDW: 14.1 % (ref 11.7–15.4)
WBC: 6.3 x10E3/uL (ref 3.4–10.8)

## 2024-01-05 LAB — CMP14+EGFR
ALT: 9 IU/L (ref 0–32)
AST: 15 IU/L (ref 0–40)
Albumin: 4.1 g/dL (ref 3.8–4.9)
Alkaline Phosphatase: 117 IU/L (ref 49–135)
BUN/Creatinine Ratio: 13 (ref 9–23)
BUN: 15 mg/dL (ref 6–24)
Bilirubin Total: 0.4 mg/dL (ref 0.0–1.2)
CO2: 21 mmol/L (ref 20–29)
Calcium: 9.6 mg/dL (ref 8.7–10.2)
Chloride: 105 mmol/L (ref 96–106)
Creatinine, Ser: 1.12 mg/dL — ABNORMAL HIGH (ref 0.57–1.00)
Globulin, Total: 2.5 g/dL (ref 1.5–4.5)
Glucose: 81 mg/dL (ref 70–99)
Potassium: 4.1 mmol/L (ref 3.5–5.2)
Sodium: 140 mmol/L (ref 134–144)
Total Protein: 6.6 g/dL (ref 6.0–8.5)
eGFR: 57 mL/min/1.73 — ABNORMAL LOW (ref >59)

## 2024-01-05 LAB — HEPATITIS B SURFACE ANTIBODY, QUANTITATIVE: Hepatitis B Surf Ab Quant: <3.5 m[IU]/mL — ABNORMAL LOW

## 2024-01-05 MED ORDER — WEGOVY 1.7 MG/0.75ML ~~LOC~~ SOAJ: 1.7000 mg | SUBCUTANEOUS | 1 refills | Status: DC

## 2024-01-06 NOTE — Assessment & Plan Note (Signed)
 Update CBC today to ensure stability after recent surgery

## 2024-01-06 NOTE — Assessment & Plan Note (Signed)
 Morbid obesity with ongoing weight management. Considering bariatric surgery but concerned about safety due to previous surgeries and scar tissue. Interested in increasing Wegovy  dose pending lab results. Plans to discuss surgical team's stance on surgery before weight goal. - Check liver function tests before increasing Wegovy  dose. - Continue Wegovy  1 mg weekly until lab results are reviewed, then increase to 1.7mg  weekly if stable - Discuss bariatric surgery options and safety concerns with the surgical team.

## 2024-01-06 NOTE — Assessment & Plan Note (Signed)
 Flu shot today Declines COVID vaccine and shingrix  today Hep B surface antibody titer ordered

## 2024-01-12 ENCOUNTER — Ambulatory Visit

## 2024-01-19 ENCOUNTER — Ambulatory Visit

## 2024-01-19 DIAGNOSIS — Z23 Encounter for immunization: Secondary | ICD-10-CM

## 2024-01-19 NOTE — Progress Notes (Signed)
 Patient presents to nurse clinic for Hep B vaccine.  #1 Hep B vaccine administered without complication. See admin for details.

## 2024-01-21 DIAGNOSIS — R2681 Unsteadiness on feet: Secondary | ICD-10-CM | POA: Diagnosis not present

## 2024-01-21 DIAGNOSIS — J4489 Other specified chronic obstructive pulmonary disease: Secondary | ICD-10-CM | POA: Diagnosis not present

## 2024-01-21 DIAGNOSIS — D509 Iron deficiency anemia, unspecified: Secondary | ICD-10-CM | POA: Diagnosis not present

## 2024-01-21 DIAGNOSIS — I130 Hypertensive heart and chronic kidney disease with heart failure and stage 1 through stage 4 chronic kidney disease, or unspecified chronic kidney disease: Secondary | ICD-10-CM | POA: Diagnosis not present

## 2024-01-21 DIAGNOSIS — M62838 Other muscle spasm: Secondary | ICD-10-CM | POA: Diagnosis not present

## 2024-01-21 DIAGNOSIS — Z79899 Other long term (current) drug therapy: Secondary | ICD-10-CM | POA: Diagnosis not present

## 2024-01-21 DIAGNOSIS — E559 Vitamin D deficiency, unspecified: Secondary | ICD-10-CM | POA: Diagnosis not present

## 2024-01-21 DIAGNOSIS — M545 Low back pain, unspecified: Secondary | ICD-10-CM | POA: Diagnosis not present

## 2024-01-21 DIAGNOSIS — Z7901 Long term (current) use of anticoagulants: Secondary | ICD-10-CM | POA: Diagnosis not present

## 2024-01-21 DIAGNOSIS — L309 Dermatitis, unspecified: Secondary | ICD-10-CM | POA: Diagnosis not present

## 2024-01-21 DIAGNOSIS — R7303 Prediabetes: Secondary | ICD-10-CM | POA: Diagnosis not present

## 2024-01-21 DIAGNOSIS — Z6841 Body Mass Index (BMI) 40.0 and over, adult: Secondary | ICD-10-CM | POA: Diagnosis not present

## 2024-01-21 DIAGNOSIS — I4891 Unspecified atrial fibrillation: Secondary | ICD-10-CM | POA: Diagnosis not present

## 2024-01-21 DIAGNOSIS — G620 Drug-induced polyneuropathy: Secondary | ICD-10-CM | POA: Diagnosis not present

## 2024-01-21 DIAGNOSIS — M349 Systemic sclerosis, unspecified: Secondary | ICD-10-CM | POA: Diagnosis not present

## 2024-01-21 DIAGNOSIS — Z8249 Family history of ischemic heart disease and other diseases of the circulatory system: Secondary | ICD-10-CM | POA: Diagnosis not present

## 2024-01-21 DIAGNOSIS — I509 Heart failure, unspecified: Secondary | ICD-10-CM | POA: Diagnosis not present

## 2024-01-21 DIAGNOSIS — A609 Anogenital herpesviral infection, unspecified: Secondary | ICD-10-CM | POA: Diagnosis not present

## 2024-01-21 DIAGNOSIS — Z86718 Personal history of other venous thrombosis and embolism: Secondary | ICD-10-CM | POA: Diagnosis not present

## 2024-01-21 DIAGNOSIS — D6869 Other thrombophilia: Secondary | ICD-10-CM | POA: Diagnosis not present

## 2024-01-21 DIAGNOSIS — D529 Folate deficiency anemia, unspecified: Secondary | ICD-10-CM | POA: Diagnosis not present

## 2024-01-21 DIAGNOSIS — N1832 Chronic kidney disease, stage 3b: Secondary | ICD-10-CM | POA: Diagnosis not present

## 2024-01-21 DIAGNOSIS — N3941 Urge incontinence: Secondary | ICD-10-CM | POA: Diagnosis not present

## 2024-01-21 DIAGNOSIS — Z833 Family history of diabetes mellitus: Secondary | ICD-10-CM | POA: Diagnosis not present

## 2024-01-21 DIAGNOSIS — Z8543 Personal history of malignant neoplasm of ovary: Secondary | ICD-10-CM | POA: Diagnosis not present

## 2024-01-21 DIAGNOSIS — F329 Major depressive disorder, single episode, unspecified: Secondary | ICD-10-CM | POA: Diagnosis not present

## 2024-01-21 DIAGNOSIS — Z7952 Long term (current) use of systemic steroids: Secondary | ICD-10-CM | POA: Diagnosis not present

## 2024-01-21 DIAGNOSIS — J9610 Chronic respiratory failure, unspecified whether with hypoxia or hypercapnia: Secondary | ICD-10-CM | POA: Diagnosis not present

## 2024-01-21 DIAGNOSIS — J301 Allergic rhinitis due to pollen: Secondary | ICD-10-CM | POA: Diagnosis not present

## 2024-02-10 ENCOUNTER — Other Ambulatory Visit: Payer: Self-pay

## 2024-02-10 MED ORDER — WEGOVY 1.7 MG/0.75ML ~~LOC~~ SOAJ: 1.7000 mg | SUBCUTANEOUS | 1 refills | Status: DC

## 2024-02-12 NOTE — Telephone Encounter (Signed)
 Hi everyone,  I am not sure why this patient was assigned to Dr. McDiarmid as PCP - I think it was accidentally changed when she checked in on 10/7 for her nurse appointment... it looks like Marjorie made the adjustment that morning on accident. She is my longterm patient. I have changed the PCP back to me. She does have an upcoming appointment with me. Thank you Dr. McDiarmid for refilling her medication.   Thanks, Laymon JINNY Legions, MD

## 2024-02-17 ENCOUNTER — Ambulatory Visit: Payer: Self-pay | Admitting: Gastroenterology

## 2024-02-29 ENCOUNTER — Encounter: Payer: Self-pay | Admitting: *Deleted

## 2024-02-29 ENCOUNTER — Ambulatory Visit (INDEPENDENT_AMBULATORY_CARE_PROVIDER_SITE_OTHER): Admitting: Family Medicine

## 2024-02-29 ENCOUNTER — Encounter: Payer: Self-pay | Admitting: Family Medicine

## 2024-02-29 DIAGNOSIS — Z0000 Encounter for general adult medical examination without abnormal findings: Secondary | ICD-10-CM

## 2024-02-29 DIAGNOSIS — M549 Dorsalgia, unspecified: Secondary | ICD-10-CM | POA: Diagnosis not present

## 2024-02-29 DIAGNOSIS — M545 Low back pain, unspecified: Secondary | ICD-10-CM

## 2024-02-29 MED ORDER — WEGOVY 1.7 MG/0.75ML ~~LOC~~ SOAJ: 1.7000 mg | SUBCUTANEOUS | 3 refills | Status: AC

## 2024-02-29 MED ORDER — BACLOFEN 10 MG PO TABS: 10.0000 mg | ORAL_TABLET | Freq: Two times a day (BID) | ORAL | 0 refills | Status: AC | PRN

## 2024-02-29 MED ORDER — SHINGRIX 50 MCG/0.5ML IM SUSR: INTRAMUSCULAR | 1 refills | Status: AC

## 2024-02-29 NOTE — Patient Instructions (Signed)
 It was great to see you again today.  Stay on wegovy  1.7mg  weekly Hep B shot #1 today, can get next one in 1 month at nurse visit  Refilled baclofen   Follow up in 3 months with me, sooner if needed  Be well, Dr. Donah

## 2024-02-29 NOTE — Progress Notes (Signed)
  Date of Visit: 02/29/2024   SUBJECTIVE:   HPI:  Discussed the use of AI scribe software for clinical note transcription with the patient, who gave verbal consent to proceed.  History of Present Illness [name] is a 58 year old female who presents for a follow-up regarding Wegovy  use and back pain.  Weight management and wegovy  use - Currently using Wegovy  1.7 mg weekly for weight management - Weight is decreasing despite reduced gym attendance due to caregiving responsibilities for her ill mother - Experiences manageable headaches and nausea after Wegovy  administration - Prefers not to increase Wegovy  dose to avoid worsening side effects - Is cautious and not interested in surgical approach to weight loss at present  Acute and chronic back pain - Acute episode of back pain began spontaneously last Thursday - Pain described as muscle spasm, intensity 5/10, located across her back - History of similar episodes of back pain - Finds relief with baclofen  and requests a refill - Familiar with exercises and strategies to manage back pain - Prefers to avoid injections, recently received one in her knee      OBJECTIVE:   BP 126/84   Pulse 81   Ht 5' 5 (1.651 m)   Wt (!) 343 lb 12.8 oz (155.9 kg)   LMP 10/16/2013 (Approximate)   SpO2 98%   BMI 57.21 kg/m  Gen: no acute distress, pleasant, cooperative, well appearing HEENT: normocephalic, atraumatic  Heart: regular rate and rhythm, no murmur Lungs: clear to auscultation bilaterally, normal work of breathing  Neuro: alert, speech normal, grossly nonfocal Back: no midline tenderness to palpation, mild paraspinal tenderness in lumbar area  ASSESSMENT/PLAN:   Assessment & Plan Morbid obesity (HCC) Wegovy  1.7 mg weekly effective with weight loss. Prefers medical management over surgery due to clotting risks and past surgical experiences. - Continue Wegovy  1.7 mg weekly. - Refilled Wegovy  prescription. - Encouraged  continuation of current weight management plan. Routine adult health maintenance - Administered hepatitis B vaccine today. - Provided prescription for Shingrix  vaccine post-holidays. - patient anticipates she will have lipids drawn soon at cardiologist office Acute back pain, unspecified back location, unspecified back pain laterality Pain rated 5/10, managed with baclofen . Prefers to avoid injections. - Refilled baclofen  prescription.   FOLLOW UP: Follow up in 3 mos for above issues  Cloud Graham J. Donah, MD [facility]

## 2024-03-01 ENCOUNTER — Ambulatory Visit: Attending: Internal Medicine | Admitting: Internal Medicine

## 2024-03-01 ENCOUNTER — Encounter: Payer: Self-pay | Admitting: Internal Medicine

## 2024-03-01 ENCOUNTER — Other Ambulatory Visit (HOSPITAL_COMMUNITY): Payer: Self-pay

## 2024-03-01 VITALS — BP 152/88 | HR 76 | Ht 65.0 in | Wt 346.9 lb

## 2024-03-01 DIAGNOSIS — Z79899 Other long term (current) drug therapy: Secondary | ICD-10-CM | POA: Diagnosis not present

## 2024-03-01 DIAGNOSIS — E782 Mixed hyperlipidemia: Secondary | ICD-10-CM | POA: Diagnosis not present

## 2024-03-01 DIAGNOSIS — E7800 Pure hypercholesterolemia, unspecified: Secondary | ICD-10-CM

## 2024-03-01 DIAGNOSIS — Z86711 Personal history of pulmonary embolism: Secondary | ICD-10-CM | POA: Diagnosis not present

## 2024-03-01 DIAGNOSIS — I10 Essential (primary) hypertension: Secondary | ICD-10-CM | POA: Diagnosis not present

## 2024-03-01 DIAGNOSIS — Z7901 Long term (current) use of anticoagulants: Secondary | ICD-10-CM | POA: Diagnosis not present

## 2024-03-01 DIAGNOSIS — G4733 Obstructive sleep apnea (adult) (pediatric): Secondary | ICD-10-CM

## 2024-03-01 MED ORDER — EZETIMIBE 10 MG PO TABS
10.0000 mg | ORAL_TABLET | Freq: Every day | ORAL | 3 refills | Status: AC
  Filled 2024-03-01: qty 90, 90d supply, fill #0

## 2024-03-01 NOTE — Progress Notes (Signed)
 Cardiology Office Note:  .   Date:  03/01/2024  ID:  ANNAJULIA Thompson, DOB [birth year], MRN [id number] PCP: Donah Laymon PARAS, MD  [facility] Providers Cardiologist:  Soyla DELENA Merck, MD Cardiology APP:  Madie Jon Garre, PA    History of Present Illness: .   [name] is a 58 y.o. female.  Discussed the use of AI scribe software for clinical note transcription with the patient, who gave verbal consent to proceed.  History of Present Illness [name] is a 58 year old female who presents for follow-up regarding weight management and cardiovascular health.  She has obesity and is considering bariatric surgery. Since starting Wegovy  in July, her weight decreased from about 379 lb to 343 lb. She has mild transient headaches and nausea after Wegovy  1.7 mg weekly and prefers not to increase the dose. She is engaged in water therapy and land exercises and is motivated by her progress.  Her conditions include endometrial and ovarian cancer s/p surgery and chemotherapy, obstructive sleep apnea on CPAP, type 2 diabetes, hypertension, hyperlipidemia, and prior deep vein thrombosis and pulmonary embolism. She takes Xarelto  20 mg daily without bleeding. November lipids showed LDL 129, triglycerides 82, and HDL 66. She had side effects with rosuvastatin  and is off statins. She previously took ezetimibe  10 mg but stopped due to refill issues and is willing to restart it.  She reports no chest pain or shortness of breath. She avoids caffeine  and stays hydrated with flavored water. She takes amlodipine /olmesartan  (Azor ) 5/20 mg daily for blood pressure. She uses Lasix  20 mg as needed but has not needed it for months.  She is doing really well, and is in good spirits today.   ROS: negative except per HPI above.  Studies Reviewed: .        Results LABS LDL: 129 (02/2023) Triglycerides: 82 (02/2023) HDL: 66 (02/2023)  RADIOLOGY Coronary CT: No coronary artery  disease (2022)  DIAGNOSTIC EKG: Normal with PVCs (11/2023) Risk Assessment/Calculations:       Physical Exam:   VS:  BP (!) 152/88 (BP Location: Left Arm, Patient Position: Sitting)   Pulse 76   Ht 5' 5 (1.651 m)   Wt (!) 346 lb 14.4 oz (157.4 kg)   LMP 10/16/2013 (Approximate)   SpO2 99%   BMI 57.73 kg/m    Wt Readings from Last 3 Encounters:  03/01/24 (!) 346 lb 14.4 oz (157.4 kg)  02/29/24 (!) 343 lb 12.8 oz (155.9 kg)  01/04/24 (!) 350 lb (158.8 kg)     Physical Exam MEASUREMENTS: Weight- 346. GENERAL: Alert, cooperative, well developed, no acute distress HEENT: Normocephalic, normal oropharynx, moist mucous membranes CHEST: Clear to auscultation bilaterally, no wheezes, rhonchi, or crackles CARDIOVASCULAR: Normal heart rate and rhythm, S1 and S2 normal without murmurs ABDOMEN: Soft, non-tender, non-distended, without organomegaly, normal bowel sounds EXTREMITIES: No cyanosis or edema NEUROLOGICAL: Cranial nerves grossly intact, moves all extremities without gross motor or sensory deficit   ASSESSMENT AND PLAN: .    Assessment and Plan Assessment & Plan Obesity management with Wegovy  and consideration for bariatric surgery DM2 Significant weight loss from 379 lbs to 343 lbs with Wegovy  since July. Bariatric surgery deferred due to ongoing weight loss and improved health. - Continue Wegovy  1.7 mg once weekly per PCP. - Encouraged regular exercise and adequate protein intake. - Monitor weight and health status before considering bariatric surgery.  Hypertension Managed with amlodipine  and olmesartan . - Continue amlodipine /olmesartan  (Azor ) 5/20 mg daily  Hyperlipidemia, resuming ezetimibe  therapy Medication management Hyperlipidemia with previous intolerance to rosuvastatin . Ezetimibe  effective without side effects. LDL likely improved with weight loss. - Resumed ezetimibe  10 mg daily. - Check cholesterol levels in three months with primary care provider. -  Check lipoprotein A levels in three months.  Obstructive sleep apnea on CPAP Obstructive sleep apnea managed with CPAP. Weight loss expected to improve symptoms.  History of deep vein thrombosis and pulmonary embolism on chronic anticoagulation History of DVT and PE managed with chronic Xarelto . No bleeding issues reported. - Continue Xarelto  20 mg daily.  Premature ventricular contractions Noted on EKG from August. No significant symptoms reported. - Ensure adequate hydration. - Avoid caffeine  intake.       Soyla Merck, MD, FACC

## 2024-03-01 NOTE — Patient Instructions (Addendum)
 Medication Instructions:  Please start: Ezetimibe  (Zetia ) 10 mg one tablet, once daily  *If you need a refill on your cardiac medications before your next appointment, please call your pharmacy*  Lab Work: None  Follow-Up: At [facility], you and your health needs are our priority.  As part of our continuing mission to provide you with exceptional heart care, our providers are all part of one team.  This team includes your primary Cardiologist (physician) and Advanced Practice Providers or APPs (Physician Assistants and Nurse Practitioners) who all work together to provide you with the care you need, when you need it.  Your next appointment:   6 month(s) (We will mail a reminder letter in Feb 2026; please call for a May 2026 appointment)  Provider:   Soyla DELENA Merck, MD or Jon Hails, PA, or Scot Ford, [place], or Glendia Ferrier, [place]  Other Instructions Please call us  or send a MyChart message with any Cardiology related questions/concerns.  [phone].  Thank you!

## 2024-03-02 NOTE — Assessment & Plan Note (Signed)
-   Administered hepatitis B vaccine today. - Provided prescription for Shingrix  vaccine post-holidays. - patient anticipates she will have lipids drawn soon at cardiologist office

## 2024-03-02 NOTE — Assessment & Plan Note (Signed)
 Wegovy  1.7 mg weekly effective with weight loss. Prefers medical management over surgery due to clotting risks and past surgical experiences. - Continue Wegovy  1.7 mg weekly. - Refilled Wegovy  prescription. - Encouraged continuation of current weight management plan.

## 2024-03-10 ENCOUNTER — Other Ambulatory Visit: Payer: Self-pay | Admitting: Nurse Practitioner

## 2024-03-10 ENCOUNTER — Ambulatory Visit: Admitting: Pulmonary Disease

## 2024-03-17 ENCOUNTER — Other Ambulatory Visit: Payer: Self-pay | Admitting: Internal Medicine

## 2024-03-17 DIAGNOSIS — I10 Essential (primary) hypertension: Secondary | ICD-10-CM

## 2024-03-18 ENCOUNTER — Ambulatory Visit: Admitting: Pulmonary Disease

## 2024-03-18 ENCOUNTER — Encounter: Payer: Self-pay | Admitting: Pulmonary Disease

## 2024-03-18 VITALS — BP 155/92 | HR 73 | Ht 65.0 in | Wt 348.0 lb

## 2024-03-18 DIAGNOSIS — K449 Diaphragmatic hernia without obstruction or gangrene: Secondary | ICD-10-CM | POA: Diagnosis not present

## 2024-03-18 DIAGNOSIS — T17908S Unspecified foreign body in respiratory tract, part unspecified causing other injury, sequela: Secondary | ICD-10-CM

## 2024-03-18 DIAGNOSIS — K219 Gastro-esophageal reflux disease without esophagitis: Secondary | ICD-10-CM | POA: Diagnosis not present

## 2024-03-18 DIAGNOSIS — Z8701 Personal history of pneumonia (recurrent): Secondary | ICD-10-CM | POA: Diagnosis not present

## 2024-03-18 DIAGNOSIS — J189 Pneumonia, unspecified organism: Secondary | ICD-10-CM

## 2024-03-18 DIAGNOSIS — J45909 Unspecified asthma, uncomplicated: Secondary | ICD-10-CM | POA: Diagnosis not present

## 2024-03-18 MED ORDER — MOXIFLOXACIN HCL 400 MG PO TABS: 400.0000 mg | ORAL_TABLET | Freq: Every day | ORAL | 0 refills | Status: DC

## 2024-03-18 NOTE — Progress Notes (Addendum)
 @Patient  ID: [name], female    DOB: [birth year], 58 y.o.   MRN: [id number]  Chief Complaint  Patient presents with   Medical Management of Chronic Issues    Pt states phlegm w/ blood    Referring provider: Donah Laymon PARAS, MD  HPI:   58 y.o. woman whom we are seeing in for in follow up of recurrent pneumonia due to aspiration. Multiple PCP note reviewed.  Most recent cardiology note reviewed.  Overall doing okay.  Little bit of worsening cough over the last couple weeks.  Scant hemoptysis again.  Took doxycycline  open have improved.  Still persisting.  Yellow to gray sputum.  Streaks of scant hemoptysis.  Picture reviewed today.  Overall she is feeling okay.  Lost 30+ pounds.  Likely reflux improving with weight loss.  Suspect bulk of cough and pneumonia is related to aspiration, silent.  HPI at initial visit: Patient reports history of recurrent pneumonia.  For started after diagnosis of concomitant ovarian and endometrial cancer.  She received chemotherapy for this.  Also had hysterectomy.  Last chemotherapy dose about 6 years ago.  Notably, port still in place.  Ever since then she has gotten pneumonias at least once a year.  Treated with antibiotics and usually symptoms improved with course of weeks.  She reports recent aspiration pneumonia, diagnosed earlier in 2023.  Treated with antibiotics improved.  She had recurrent pneumonia 11/2021 on chest x-ray, my review and interpretation reveals nodular opacities bilaterally, right greater than left with more confluent densities right lower lung field.  Symptoms were preceded by sinus infection.  She received antibiotics for this.  Developed pneumonia in spite of this.  Required 2 additional courses of antibiotics.  Repeat chest x-ray 12/2021 my review interpretation reveals similar distribution of nodular opacities bilaterally right greater than left with interval decrease in size and decrease confluence in the right lower lung  field indicative of improving infectious pneumonia.  She had the Streptococcus immunization last year.  She reports he has a hiatal hernia.  She reports significant reflux symptoms.  On a PPI which helps with the burning but bad reflux.  She wakes up on a nightly basis with coughing.  PMH: Ovarian and endometrial cancer in remission, GERD, childhood asthma, history of recurrent PE on chronic Xarelto  Surgical history: Hysterectomy, bowel resection, C-section, ventral hernia repair, tonsillectomy Family history: Mother with diabetes, hypertension, hyperlipidemia, father with hyperlipidemia Social history: Never smoker, lives in [place] / Pulmonary Flowsheets:   ACT:      No data to display          MMRC:     No data to display          Epworth:      No data to display          Tests:   FENO:  No results found for: NITRICOXIDE  PFT:     No data to display          WALK:     07/08/2022   10:55 AM  SIX MIN WALK  Supplimental Oxygen during Test? (L/min) No  Tech Comments: Pt slow paced drop and the end of the lap, she recovered once sitting back to 96%    Imaging: Personally reviewed and as per EMR and discussion in this note No results found.   Lab Results: Personally reviewed CBC    Component Value Date/Time   WBC 6.3 01/04/2024 1013   WBC 8.8 11/27/2023 0511  RBC 4.21 01/04/2024 1013   RBC 3.93 11/27/2023 0511   HGB 12.6 01/04/2024 1013   HGB 11.6 01/17/2016 0928   HCT 38.8 01/04/2024 1013   HCT 35.2 01/17/2016 0928   PLT 204 01/04/2024 1013   MCV 92 01/04/2024 1013   MCV 93.1 01/17/2016 0928   MCH 29.9 01/04/2024 1013   MCH 29.0 11/27/2023 0511   MCHC 32.5 01/04/2024 1013   MCHC 31.1 11/27/2023 0511   RDW 14.1 01/04/2024 1013   RDW 14.0 01/17/2016 0928   LYMPHSABS 1.1 11/25/2023 0923   LYMPHSABS 1.1 09/16/2022 0941   LYMPHSABS 1.9 01/17/2016 0928   MONOABS 0.5 11/25/2023 0923   MONOABS 0.5 01/17/2016 0928    EOSABS 0.1 11/25/2023 0923   EOSABS 0.2 09/16/2022 0941   BASOSABS 0.0 11/25/2023 0923   BASOSABS 0.1 09/16/2022 0941   BASOSABS 0.0 01/17/2016 0928    BMET    Component Value Date/Time   NA 140 01/04/2024 1013   NA 140 01/17/2016 0928   K 4.1 01/04/2024 1013   K 4.8 01/17/2016 0928   CL 105 01/04/2024 1013   CO2 21 01/04/2024 1013   CO2 22 01/17/2016 0928   GLUCOSE 81 01/04/2024 1013   GLUCOSE 131 (H) 11/27/2023 0511   GLUCOSE 103 01/17/2016 0928   BUN 15 01/04/2024 1013   BUN 28.7 (H) 01/17/2016 0928   CREATININE 1.12 (H) 01/04/2024 1013   CREATININE 1.1 01/17/2016 0928   CALCIUM  9.6 01/04/2024 1013   CALCIUM  10.0 01/17/2016 0928   GFRNONAA 56 (L) 11/27/2023 0511   GFRNONAA 77 02/26/2015 0945   GFRAA 59 (L) 07/20/2019 1004   GFRAA 89 02/26/2015 0945    BNP    Component Value Date/Time   BNP 49.7 12/23/2022 1702   BNP 68.3 06/25/2022 0452    ProBNP No results found for: PROBNP  Specialty Problems       Pulmonary Problems   Bloody sputum   OSA (obstructive sleep apnea)   Bronchospasm   Acute recurrent maxillary sinusitis   Multifocal pneumonia   Acute respiratory failure with hypoxia (HCC)   Aspiration into respiratory tract   Recurrent pneumonia   Congestion of upper airway   Upper respiratory tract infection   Hiatal hernia    Allergies  Allergen Reactions   Tape Itching and Other (See Comments)    NO TEGADERM!!!! It pulls OFF the skin!! Surgical tape is Ok per patient.    Penicillins Rash    Has patient had a PCN reaction causing immediate rash, facial/tongue/throat swelling, SOB or lightheadedness with hypotension: no Has patient had a PCN reaction causing severe rash involving mucus membranes or skin necrosis: no Has patient had a PCN reaction that required hospitalization in the hospital at the time Has patient had a PCN reaction occurring within the last 10 years: no If all of the above answers are NO, then may proceed with Cephalosporin  use.     Immunization History  Administered Date(s) Administered   Hepb-cpg 01/19/2024, 02/29/2024   Influenza Split 06/10/2011   Influenza, Seasonal, Injecte, Preservative Fre 02/05/2023, 01/04/2024   Influenza,inj,Quad PF,6+ Mos 12/12/2014, 01/17/2016, 12/04/2016, 01/14/2018, 02/01/2019, 01/05/2020, 12/20/2020, 01/09/2022   Moderna Sars-Covid-2 Vaccination 06/24/2019, 07/25/2019   PFIZER(Purple Top)SARS-COV-2 Vaccination 03/15/2020   PNEUMOCOCCAL CONJUGATE-20 12/20/2020   PPD Test 02/02/2019   Td 07/11/2008   Tdap 05/30/2022    Past Medical History:  Diagnosis Date   Allergy    Anemia    Anxiety    Arthritis    Aspiration pneumonia (  HCC)    Blood transfusion without reported diagnosis    Cough    Cough 06/11/2018   Difficulty sleeping 06/11/2018   Diverticulitis with perforation 2010   DVT (deep vein thrombosis) in pregnancy 11/24/2016   DVT, lower extremity (HCC)    Endometrial cancer (HCC)    Enlarged lymph nodes 08/13/2017   Family history of adverse reaction to anesthesia    pts daughter had difficulty awakening following anesthesia, long time to wake up   GERD (gastroesophageal reflux disease)    Headache    Hemoptysis 04/22/2018   History of bronchitis    History of ear infections    Hospital discharge follow-up 03/02/2019   Hx of migraines    Hyperlipidemia    Hypertension    IBS (irritable bowel syndrome)    Kidney infection    Lupus (systemic lupus erythematosus) (HCC) 02/2017   Morbid obesity (HCC)    Neck pain 01/13/2019   Ovarian cancer (HCC) dx'd 01/2015   PONV (postoperative nausea and vomiting)    Port-A-Cath in place    Right side   Portacath in place 09/12/2015   Pre-diabetes    Pyelonephritis    Right knee pain 11/28/2011   Scleroderma (HCC)    Screen for colon cancer 03/02/2019   Slow rate of speech 08/22/2015   Chronic from CVA.  She also has loss of left nasolabial fold and slight left facial droop/small asymmetry on the left side  secondary to CVA   UTI (urinary tract infection) 05/07/2018    Tobacco History: Social History   Tobacco Use  Smoking Status Never  Smokeless Tobacco Never   Counseling given: Not Answered   Continue to not smoke  Outpatient Encounter Medications as of 03/18/2024  Medication Sig   acetaminophen  (TYLENOL ) 500 MG tablet Take 2 tablets (1,000 mg total) by mouth every 6 (six) hours as needed for moderate pain.   albuterol  (PROVENTIL ) (2.5 MG/3ML) 0.083% nebulizer solution Take 3 mLs (2.5 mg total) by nebulization every 6 (six) hours as needed for wheezing or shortness of breath.   albuterol  (VENTOLIN  HFA) 108 (90 Base) MCG/ACT inhaler INHALE 2 PUFFS INTO THE LUNGS EVERY 6 HOURS AS NEEDED FOR WHEEZING OR SHORTNESS OF BREATH   amLODipine -olmesartan  (AZOR ) 5-20 MG tablet Take 1 tablet by mouth daily.   baclofen  (LIORESAL ) 10 MG tablet Take 1 tablet (10 mg total) by mouth 2 (two) times daily as needed for muscle spasms.   cetirizine  (ZYRTEC ) 10 MG tablet Take 10 mg by mouth as needed for allergies or rhinitis.   Cholecalciferol (VITAMIN D3) 50 MCG (2000 UT) TABS Take 2,000 Units by mouth daily.   dicyclomine  (BENTYL ) 10 MG capsule Take 10 mg by mouth as needed.   ezetimibe  (ZETIA ) 10 MG tablet Take 1 tablet (10 mg total) by mouth daily.   famotidine  (PEPCID ) 40 MG tablet Take 1 tablet (40 mg total) by mouth at bedtime.   fesoterodine  (TOVIAZ ) 8 MG TB24 tablet TAKE 1 TABLET(8 MG) BY MOUTH DAILY   fluticasone  (FLONASE ) 50 MCG/ACT nasal spray Place 2 sprays into both nostrils in the morning and at bedtime.   folic acid  (FOLVITE ) 1 MG tablet Take 1 tablet (1 mg total) by mouth daily.   furosemide  (LASIX ) 20 MG tablet Take 1 tablet (20 mg total) by mouth daily as needed for edema or fluid.   moxifloxacin  (AVELOX ) 400 MG tablet Take 1 tablet (400 mg total) by mouth daily.   omeprazole  (PRILOSEC) 40 MG capsule TAKE 1 CAPSULE(40 MG) BY  MOUTH IN THE MORNING AND AT BEDTIME   ondansetron  (ZOFRAN ) 4 MG  tablet Take 1 tablet (4 mg total) by mouth every 6 (six) hours as needed for nausea.   OXYGEN Inhale 2 L/min into the lungs at bedtime as needed (for shortness of breath).   semaglutide -weight management (WEGOVY ) 1.7 MG/0.75ML SOAJ SQ injection Inject 1.7 mg into the skin once a week.   Tiotropium Bromide-Olodaterol (STIOLTO RESPIMAT ) 2.5-2.5 MCG/ACT AERS Inhale 2 puffs into the lungs daily.   triamcinolone  cream (KENALOG ) 0.1 % Apply 1 Application topically 2 (two) times daily as needed (Skin irritation.).   XARELTO  20 MG TABS tablet TAKE 1 TABLET(20 MG) BY MOUTH IN THE MORNING   Zoster Vaccine Adjuvanted (SHINGRIX ) injection Administer Shingrix  vaccination now and repeat in two months   No facility-administered encounter medications on file as of 03/18/2024.     Review of Systems  Review of Systems  N/a Physical Exam  BP (!) 164/91   Pulse 73   Ht 5' 5 (1.651 m) Comment: per pt  Wt (!) 348 lb (157.9 kg)   LMP 10/16/2013   SpO2 100%   BMI 57.91 kg/m   Wt Readings from Last 5 Encounters:  03/18/24 (!) 348 lb (157.9 kg)  03/01/24 (!) 346 lb 14.4 oz (157.4 kg)  02/29/24 (!) 343 lb 12.8 oz (155.9 kg)  01/04/24 (!) 350 lb (158.8 kg)  12/24/23 (!) 351 lb (159.2 kg)    BMI Readings from Last 5 Encounters:  03/18/24 57.91 kg/m  03/01/24 57.73 kg/m  02/29/24 57.21 kg/m  01/04/24 58.24 kg/m  12/24/23 58.41 kg/m     Physical Exam General: Sitting up, no distress Eyes: No icterus Neck: No JVP Pulmonary: Good air excursion, clear, normal work of breathing Abdomen: Nondistended   Assessment & Plan:   GERD/recurrent aspiration-hemoptysis/cough: Continue PPI.  Counseled lifestyle modification many which she is doing related to diet and time of eating.  Recommend wedge for elevating head post on bed.  Fortunately, she had recently ordered a wedge pillow.  If this is not comfortable instructed her on how to elevate head of bed with phone that she has at home.  She has been  losing weight and was congratulated on this effort.  I think ultimately symptoms will require Niesen fundoplication to see if improves.  Recently completed course of doxycycline  with improvement.  Mild hemoptysis and sputum production persist.  Moxifloxacin  course prescribed today to target anaerobes.  Penicillin allergy noted.  If too expensive could trial Z-Pak.  Asthma: Diagnosed as a child.  Symptoms not too bad as older adult.  Does use albuterol  with coughing fits and with some shortness of breath.  Particular when she is ill with pneumonia.  She does find this beneficial.  Improvement with LAMA LABA therapy, continue Stiolto.   Return in about 3 months (around 06/16/2024) for f/u Dr. Annella.   Donnice JONELLE Annella, MD 03/18/2024

## 2024-03-18 NOTE — Patient Instructions (Signed)
 Take moxifloxacin  1 tab once a day for 10 days  No other changes  Return to clinic in 3 months or sooner as needed

## 2024-03-20 ENCOUNTER — Other Ambulatory Visit: Payer: Self-pay | Admitting: Family Medicine

## 2024-03-20 DIAGNOSIS — J189 Pneumonia, unspecified organism: Secondary | ICD-10-CM

## 2024-04-04 ENCOUNTER — Ambulatory Visit: Payer: Self-pay | Admitting: Pulmonary Disease

## 2024-04-04 ENCOUNTER — Ambulatory Visit (INDEPENDENT_AMBULATORY_CARE_PROVIDER_SITE_OTHER): Admitting: Pulmonary Disease

## 2024-04-04 VITALS — BP 142/81 | HR 80 | Temp 98.6°F | Ht 65.0 in | Wt 354.6 lb

## 2024-04-04 DIAGNOSIS — R042 Hemoptysis: Secondary | ICD-10-CM

## 2024-04-04 NOTE — Patient Instructions (Signed)
" °  VISIT SUMMARY: Today, we discussed your ongoing hemoptysis and its management, as well as your history of recurrent aspiration pneumonia, hiatal hernia with reflux, and venous thromboembolism. We have planned further investigations and provided guidance on monitoring your symptoms.  YOUR PLAN: HEMOPTYSIS: You have been experiencing chronic hemoptysis, which has recently worsened. -We have ordered a CT scan of your chest with contrast to check for any lung issues. -Depending on the CT scan results, we may consider a bronchoscopy to find the source of the bleeding. -Please monitor for fever, chills, night sweats, or a productive cough with green or yellow sputum, and contact us  if these occur.  RECURRENT ASPIRATION PNEUMONIA: You have had multiple episodes of aspiration pneumonia, but the frequency has decreased recently. -Monitor your symptoms and avoid antibiotics unless you show signs of an active infection.  HIATAL HERNIA WITH GASTROESOPHAGEAL REFLUX: Your hiatal hernia with reflux may be contributing to your aspiration pneumonia. Your weight loss has helped improve your symptoms. -Continue your weight loss efforts to help manage your reflux symptoms.  HISTORY OF VENOUS THROMBOEMBOLISM ON CHRONIC ANTICOAGULATION: You have a history of blood clots and are currently on Xarelto . -Continue taking Xarelto  as prescribed. -We have ordered a CT scan with contrast to rule out any new pulmonary embolism.  Contains text generated by [employer].  "

## 2024-04-04 NOTE — Progress Notes (Signed)
 "  Established Patient Pulmonology Office Visit   Subjective:  Patient ID: [name], female    DOB: Sep 21, 1965  MRN: [id number]  CC:  Chief Complaint  Patient presents with   Emphysema    HPI  Discussed the use of AI scribe software for clinical note transcription with the patient, who gave verbal consent to proceed.  History of Present Illness [name] is a 58 year old female with a history of ovarian and endometrial cancer in remission, recurrent aspiration pneumonia, recurrent PE on Xarelto  and GERD who presents with hemoptysis.  She has experienced recurrent aspiration pneumonia seven times in the past, with a few episodes this year. She manages her symptoms vigilantly, especially when hemoptysis occurs, which necessitates increased oxygen use and breathing treatments.  On December 25th, she began experiencing hemoptysis, which has persisted but decreased in amount. She uses oxygen, inhalers, and breathing treatments to manage her symptoms. She experienced a low-grade fever on December 26th and 27th. No recent illness in her surroundings.  She has a history of a hiatal hernia and has lost 32 pounds, which has improved her symptoms. She is on Xarelto  for a history of blood clots during cancer treatment for ovarian and endometrial cancer. She had a pulmonary embolism and deep vein thrombosis in the past. No recent lung scans, but an abdominal scan was done in August due to emergency gallbladder surgery.  She has been experiencing hemoptysis since 2018, following a colonoscopy, and has not had a bronchoscopy. She has hemoptysis, not epistaxis or hematemesis. She uses oxygen primarily at night and during episodes of increased symptoms. No epistaxis, and imaging sometimes reveals lung abnormalities. She takes a diuretic as needed for fluid retention.  It seems that hemoptysis improves with rotating antimicrobials. The patient had bilateral pneumonia around 06/2022 and was  treated with antimicrobials with improvement. She was discharged on oxygen. There is reported hx of autoimmune disease including lupus and scleroderma.  ROS   Current Medications[1]      Objective:  BP (!) 142/81 (BP Location: Left Arm, Patient Position: Sitting)   Pulse 80   Temp 98.6 F (37 C)   Ht 5' 5 (1.651 m)   Wt (!) 354 lb 9.6 oz (160.8 kg)   LMP 10/16/2013   SpO2 100%   BMI 59.01 kg/m  Wt Readings from Last 3 Encounters:  04/04/24 (!) 354 lb 9.6 oz (160.8 kg)  03/18/24 (!) 348 lb (157.9 kg)  03/01/24 (!) 346 lb 14.4 oz (157.4 kg)   BMI Readings from Last 3 Encounters:  04/04/24 59.01 kg/m  03/18/24 57.91 kg/m  03/01/24 57.73 kg/m   SpO2 Readings from Last 3 Encounters:  04/04/24 100%  03/18/24 100%  03/01/24 99%    Physical Exam General: NAD, alert, WD, WN Eyes: PERRL, no scleral icterus ENMT: oropharynx clear, good dentition, no oral lesions, mallampati score IV Skin: warm, intact, no rashes Neck: JVD flat, ROM and lymph node assessment normal CV: RRR, no MRG, nl S1 and S2, 1+ pitting peripheral edema Resp: clear to auscultation bilaterally, no wheezes, rales, or rhonchi, normal effort, no clubbing/cyanosis Neuro: Awake alert oriented to person place time and situation  Diagnostic Review:  Last CBC Lab Results  Component Value Date   WBC 6.3 01/04/2024   HGB 12.6 01/04/2024   HCT 38.8 01/04/2024   MCV 92 01/04/2024   MCH 29.9 01/04/2024   RDW 14.1 01/04/2024   PLT 204 01/04/2024   Last metabolic panel Lab Results  Component  Value Date   GLUCOSE 81 01/04/2024   NA 140 01/04/2024   K 4.1 01/04/2024   CL 105 01/04/2024   CO2 21 01/04/2024   BUN 15 01/04/2024   CREATININE 1.12 (H) 01/04/2024   EGFR 57 (L) 01/04/2024   CALCIUM  9.6 01/04/2024   PROT 6.6 01/04/2024   ALBUMIN 4.1 01/04/2024   LABGLOB 2.5 01/04/2024   AGRATIO 2.1 08/28/2020   BILITOT 0.4 01/04/2024   ALKPHOS 117 01/04/2024   AST 15 01/04/2024   ALT 9 01/04/2024    ANIONGAP 9 11/27/2023   + ANA, + anticentromere Ab, negative dsDNA/SSA/SSB/RA/SCL70     Assessment & Plan:   Assessment & Plan Hemoptysis Unclear etiology. The patient notes hx of recurrent aspiration pneumonias that maybe contributing. It seems that hemoptysis improves with rotating antimicrobials. The patient has varying degrees of hemoptysis including blood tinges in her phlegm and some frank non-massive hemoptysis. She showed me pictures. This has been ongoing for many years. I reviewed her chest imaging and it seems she has fleeting nodular consolidations on multiple CTA chest dating back to 07/2020 and infiltrates that wax and wane. The patient has hx of endometrial and ovarian cancer which puts her at risk for metastatic disease in the lung. This is less likely given recent relatively normal CXR. Other consideration includes diffuse alveolar hemorrhage given hx of positive autoimmune titers as noted above. Other considerations including pulmonary vascular disease and pulmonary edema. The patient appears slightly hypervolemic on examination. Regardless of etiology, the patient should have repeat chest imaging to evaluate further for recurrent pulmonary emboli which poses the question of treatment failure as well as pulmonary parenchymal abnormalities. If she has pulmonary infiltrates, she likely needs to pursue bronchoscopy to localize bleeding as well as obtain cultures and rule out DAH. If DAH is diagnosed, will need to repeat autoimmune work up and consider steroid therapy. Will await CTA chest and then decide on further direction.  Orders Placed This Encounter  Procedures   CT Angio Chest Pulmonary Embolism (PE) W or WO Contrast   I spent 62 minutes reviewing patient's chart including prior consultant notes, imaging, and PFTs as well as face-to-face with the patient, over half in discussion of the diagnosis and the importance of compliance with the treatment plan.  Return in about 4 weeks  (around 05/02/2024).   Seleny Allbright, MD     [1]  Current Outpatient Medications:    acetaminophen  (TYLENOL ) 500 MG tablet, Take 2 tablets (1,000 mg total) by mouth every 6 (six) hours as needed for moderate pain., Disp: 30 tablet, Rfl: 0   albuterol  (PROVENTIL ) (2.5 MG/3ML) 0.083% nebulizer solution, INHALE THE CONTENTS OF 1 VIAL INTO THE LUNGS VIA NEBULIZER EVERY 6 HOURS AS NEEDED FOR WHEEZING OR SHORTNESS OF BREATH, Disp: 150 mL, Rfl: 1   albuterol  (VENTOLIN  HFA) 108 (90 Base) MCG/ACT inhaler, INHALE 2 PUFFS INTO THE LUNGS EVERY 6 HOURS AS NEEDED FOR WHEEZING OR SHORTNESS OF BREATH, Disp: 18 g, Rfl: 0   amLODipine -olmesartan  (AZOR ) 5-20 MG tablet, Take 1 tablet by mouth daily., Disp: 90 tablet, Rfl: 1   baclofen  (LIORESAL ) 10 MG tablet, Take 1 tablet (10 mg total) by mouth 2 (two) times daily as needed for muscle spasms., Disp: 30 tablet, Rfl: 0   cetirizine  (ZYRTEC ) 10 MG tablet, Take 10 mg by mouth as needed for allergies or rhinitis., Disp: , Rfl:    Cholecalciferol (VITAMIN D3) 50 MCG (2000 UT) TABS, Take 2,000 Units by mouth daily., Disp: , Rfl:  dicyclomine  (BENTYL ) 10 MG capsule, Take 10 mg by mouth as needed., Disp: , Rfl:    ezetimibe  (ZETIA ) 10 MG tablet, Take 1 tablet (10 mg total) by mouth daily., Disp: 90 tablet, Rfl: 3   famotidine  (PEPCID ) 40 MG tablet, Take 1 tablet (40 mg total) by mouth at bedtime., Disp: 90 tablet, Rfl: 3   fesoterodine  (TOVIAZ ) 8 MG TB24 tablet, TAKE 1 TABLET(8 MG) BY MOUTH DAILY, Disp: 90 tablet, Rfl: 2   fluticasone  (FLONASE ) 50 MCG/ACT nasal spray, Place 2 sprays into both nostrils in the morning and at bedtime., Disp: 18.2 mL, Rfl: 5   folic acid  (FOLVITE ) 1 MG tablet, Take 1 tablet (1 mg total) by mouth daily., Disp: 90 tablet, Rfl: 3   furosemide  (LASIX ) 20 MG tablet, Take 1 tablet (20 mg total) by mouth daily as needed for edema or fluid., Disp: 30 tablet, Rfl: 0   moxifloxacin  (AVELOX ) 400 MG tablet, Take 1 tablet (400 mg total) by mouth daily.,  Disp: 10 tablet, Rfl: 0   omeprazole  (PRILOSEC) 40 MG capsule, TAKE 1 CAPSULE(40 MG) BY MOUTH IN THE MORNING AND AT BEDTIME, Disp: 60 capsule, Rfl: 1   ondansetron  (ZOFRAN ) 4 MG tablet, Take 1 tablet (4 mg total) by mouth every 6 (six) hours as needed for nausea., Disp: 20 tablet, Rfl: 0   OXYGEN, Inhale 2 L/min into the lungs at bedtime as needed (for shortness of breath)., Disp: , Rfl:    semaglutide -weight management (WEGOVY ) 1.7 MG/0.75ML SOAJ SQ injection, Inject 1.7 mg into the skin once a week., Disp: 3 mL, Rfl: 3   Tiotropium Bromide-Olodaterol (STIOLTO RESPIMAT ) 2.5-2.5 MCG/ACT AERS, Inhale 2 puffs into the lungs daily., Disp: 1 each, Rfl: 11   triamcinolone  cream (KENALOG ) 0.1 %, Apply 1 Application topically 2 (two) times daily as needed (Skin irritation.)., Disp: 45 g, Rfl: 0   XARELTO  20 MG TABS tablet, TAKE 1 TABLET(20 MG) BY MOUTH IN THE MORNING, Disp: 90 tablet, Rfl: 1   Zoster Vaccine Adjuvanted (SHINGRIX ) injection, Administer Shingrix  vaccination now and repeat in two months, Disp: 1 each, Rfl: 1  "

## 2024-04-04 NOTE — Telephone Encounter (Signed)
 Nothing further needed. Appt for 12/29.

## 2024-04-04 NOTE — Telephone Encounter (Signed)
 FYI Only or Action Required?: FYI only for provider: appointment scheduled on 12/29.  Patient is followed in Pulmonology for hx pneumonia, last seen on 03/18/2024 by Hunsucker, Donnice SAUNDERS, MD.  Called Nurse Triage reporting cough- blood present  Symptoms began several days ago.  Interventions attempted: OTC medications: tylenol , Rescue inhaler, Maintenance inhaler, Nebulizer treatments, and Home oxygen use.  Symptoms are: gradually worsening.  Triage Disposition: See HCP Within 4 Hours (Or PCP Triage)  Patient/caregiver understands and will follow disposition?: yes   E2C2 Pulmonary Triage - Initial Assessment Questions Chief Complaint (e.g., cough, sob, wheezing, fever, chills, sweat or additional symptoms) *Go to specific symptom protocol after initial questions. Cough, wheezing, fever -Th pm  How long have symptoms been present? Thursday night/Friday am  Have you tested for COVID or Flu? Note: If not, ask patient if a home test can be taken. If so, instruct patient to call back for positive results. No- patient feels she has return of aspiration pneumonia   MEDICINES:   Have you used any OTC meds to help with symptoms? Yes If yes, ask What medications? Tylenol   Have you used your inhalers/maintenance medication? Yes If yes, What medications? Albuterol  ans Stiolto, nebulizer  If inhaler, ask How many puffs and how often? Note: Review instructions on medication in the chart. Rescue- 2 times/day  OXYGEN: Do you wear supplemental oxygen? Yes If yes, How many liters are you supposed to use? 2L- as needed- patient has been using more often last few days- especially at night  Do you monitor your oxygen levels? Yes If yes, What is your reading (oxygen level) today? 92%-room air  What is your usual oxygen saturation reading?  (Note: Pulmonary O2 sats should be 90% or greater) 99%   Copied from CRM #[id number]. Topic: Clinical - Red Word Triage >> Apr 04, 2024  8:12 AM Ismael A wrote: Red Word that prompted transfer to Nurse Triage:  pt is still coughing up a little bit of blood around a dime size, ran a fever on friday but none since, pt is wheezing, chest tightness/heaviness when she lays down Reason for Disposition  [1] MILD difficulty breathing (e.g., minimal/no SOB at rest, SOB with walking, pulse < 100) AND [2] still present when not coughing  Answer Assessment - Initial Assessment Questions 1. ONSET: When did the cough begin?      OV 12/12- was given Moxifloxacin   2. SEVERITY: How bad is the cough today?      Coughs through the night, laying down makes patient feel heaviness on the chest 3. SPUTUM: Describe the color of your sputum (e.g., none, dry cough; clear, white, yellow, green)     Sputum- yellow, with blood mixed in- not coughed up anything today- patient has seen blood since 12/25 4. HEMOPTYSIS: Are you coughing up any blood? If Yes, ask: How much? (e.g., flecks, streaks, tablespoons, etc.)     Yes-nickel sized - few times throughout the day  5. DIFFICULTY BREATHING: Are you having difficulty breathing? If Yes, ask: How bad is it? (e.g., mild, moderate, severe)      Moderate- exertion causes restricted breathing 6. FEVER: Do you have a fever? If Yes, ask: What is your temperature, how was it measured, and when did it start?     Th night only- 100- no fever since 7. CARDIAC HISTORY: Do you have any history of heart disease? (e.g., heart attack, congestive heart failure)      CHF 8. LUNG HISTORY: Do you have any history  of lung disease?  (e.g., pulmonary embolus, asthma, emphysema)     HCC, asthma 9. PE RISK FACTORS: Do you have a history of blood clots? (or: recent major surgery, recent prolonged travel, bedridden)     no 10. OTHER SYMPTOMS: Do you have any other symptoms? (e.g., runny nose, wheezing, chest pain)       wheezing  Protocols used: Cough - Acute Non-Productive-A-AH

## 2024-04-06 ENCOUNTER — Other Ambulatory Visit (HOSPITAL_COMMUNITY): Payer: Self-pay

## 2024-04-06 ENCOUNTER — Encounter: Payer: Self-pay | Admitting: Oncology

## 2024-04-06 ENCOUNTER — Telehealth: Payer: Self-pay

## 2024-04-06 NOTE — Telephone Encounter (Signed)
 Renewal PA rec'd.   Submitted with new updated strength.   Key [id number]  Per insurance: Authorization already on file for this request. Authorization starting on 01/06/2024 and ending on 04/06/2025

## 2024-04-08 ENCOUNTER — Ambulatory Visit (HOSPITAL_BASED_OUTPATIENT_CLINIC_OR_DEPARTMENT_OTHER)
Admission: RE | Admit: 2024-04-08 | Discharge: 2024-04-08 | Disposition: A | Discharge status: Home / Self Care | Source: Ambulatory Visit | Attending: Pulmonary Disease | Admitting: Pulmonary Disease

## 2024-04-08 DIAGNOSIS — R042 Hemoptysis: Secondary | ICD-10-CM | POA: Insufficient documentation

## 2024-04-08 LAB — POCT I-STAT CREATININE: Creatinine, Ser: 1.1 mg/dL — ABNORMAL HIGH (ref 0.44–1.00)

## 2024-04-08 MED ORDER — IOHEXOL 350 MG/ML SOLN
100.0000 mL | Freq: Once | INTRAVENOUS | Status: AC | PRN
  Administered 2024-04-08: 100 mL via INTRAVENOUS

## 2024-04-10 ENCOUNTER — Ambulatory Visit (HOSPITAL_BASED_OUTPATIENT_CLINIC_OR_DEPARTMENT_OTHER)
Admission: RE | Admit: 2024-04-10 | Discharge: 2024-04-10 | Disposition: A | Discharge status: Home / Self Care | Source: Ambulatory Visit | Attending: Pulmonary Disease | Admitting: Pulmonary Disease

## 2024-04-10 DIAGNOSIS — R042 Hemoptysis: Secondary | ICD-10-CM | POA: Diagnosis present

## 2024-04-10 MED ORDER — IOHEXOL 350 MG/ML SOLN
75.0000 mL | Freq: Once | INTRAVENOUS | Status: AC | PRN
  Administered 2024-04-10: 75 mL via INTRAVENOUS

## 2024-04-11 ENCOUNTER — Telehealth: Payer: Self-pay

## 2024-04-11 ENCOUNTER — Ambulatory Visit: Payer: Self-pay | Admitting: Pulmonary Disease

## 2024-04-11 NOTE — Telephone Encounter (Signed)
 Patient LVM on nurse line requesting returned call from nursing staff. Returned call to patient.   She reports that she was recently diagnosed with aspiration pneumonia, 12/12. F/u on 12/29.  Due to illness, she did not take Wegovy  last Sunday. Last dosage was on 12/22.  She was asking if she should restart Wegovy  this week or if she should wait until she has completely recovered from aspiration pneumonia. She reports continued cough, fever on Thursday, 04/07/24, tmax 101. She took tylenol  for management, fever is now gone. Denies nausea, vomiting or diarrhea.   Will forward to PCP.   Chiquita JAYSON English, RN

## 2024-04-11 NOTE — Telephone Encounter (Signed)
 I would suggest she wait until her symptoms are resolved before resuming Wegovy .  Thanks, Laymon JINNY Legions, MD

## 2024-04-12 NOTE — Telephone Encounter (Signed)
 Called patient.   Discussed waiting to resume Wegovy  until her symptoms resolve.   Patient reports she agrees and was appreciative of call.

## 2024-04-13 ENCOUNTER — Telehealth: Payer: Self-pay

## 2024-04-13 ENCOUNTER — Other Ambulatory Visit: Payer: Self-pay | Admitting: Internal Medicine

## 2024-04-13 DIAGNOSIS — I10 Essential (primary) hypertension: Secondary | ICD-10-CM

## 2024-04-13 NOTE — Telephone Encounter (Signed)
 Copied from CRM [id number]. Topic: Clinical - Lab/Test Results >> Apr 11, 2024  8:34 AM Corean SAUNDERS wrote: Reason for CRM: Patient is requesting a call back from a nurse to discuss her CT imaging results.    Already been addressed by MD results reviewed pre image notes

## 2024-04-14 ENCOUNTER — Telehealth: Payer: Self-pay | Admitting: Internal Medicine

## 2024-04-14 DIAGNOSIS — I10 Essential (primary) hypertension: Secondary | ICD-10-CM

## 2024-04-14 MED ORDER — AMLODIPINE-OLMESARTAN 5-20 MG PO TABS: 1.0000 | ORAL_TABLET | Freq: Every day | ORAL | 1 refills | Status: AC

## 2024-04-14 NOTE — Telephone Encounter (Signed)
 Pt c/o medication issue:  1. Name of Medication: amLODipine -olmesartan  (AZOR ) 5-20 MG tablet   2. How are you currently taking this medication (dosage and times per day)? As written   3. Are you having a reaction (difficulty breathing--STAT)? no  4. What is your medication issue? Pt calling in for new prescription needing to be sent to   [id number] - [place], [place] - [address] AT [facility] OF [address]

## 2024-04-29 ENCOUNTER — Ambulatory Visit

## 2024-04-29 VITALS — BP 130/71 | HR 71 | Temp 97.6°F | Ht 65.0 in | Wt 335.0 lb

## 2024-04-29 DIAGNOSIS — R0602 Shortness of breath: Secondary | ICD-10-CM

## 2024-04-29 DIAGNOSIS — R5382 Chronic fatigue, unspecified: Secondary | ICD-10-CM

## 2024-04-29 NOTE — Patient Instructions (Addendum)
 It was good to see you today.   Please bring ALL of your medications with you to every visit.    Today we talked about: Fatigue and SOB. Labs and echo ordered    Thank you for choosing [facility]. Please refer to your mychart for specifics regarding today's visit or future appointments.

## 2024-04-29 NOTE — Progress Notes (Signed)
" ° ° °  SUBJECTIVE:   CHIEF COMPLAINT / HPI:   Fatigue/SOB - recent hx of recurrent pneumonia, follows with pulm. Last seen on 12/29 where CT chest was ordered. CT reviewed and showed some ground glass opacities and effusion. Per chart note, pulm felt symptoms were 2/2 pulmonary edema. She was started on 20mg  lasix  for 5 days. No indication for bronch at this time.  - states she took the medication for only 2 days because she was peeing less and felt like she did not have fluid on her - Reports since this illness, she has been fatigued and has not felt herself - She reports using her home 02 more due to feeling out of breath. Happens during washing dishes or cleaning. Had to sit down while shopping due to SOB - Has to sleep on a few pillows at night time. She is also using 1-2L 02 at night instead of CPAP.  - intermittent nausea, lasting a few seconds, at least once daily - Denies cough, poor sleep, fever, vomiting, diarrhea, low mood -has not taken wegovy  for a few weeks - Reports she was previously on iron supplement but is no longer taking as she was told to stop  PERTINENT  PMH / PSH: OSA, recurrent pneumonia, Obesity, CHF  OBJECTIVE:   BP 130/71   Pulse 71   Temp 97.6 F (36.4 C) (Oral)   Ht 5' 5 (1.651 m)   Wt (!) 335 lb (152 kg)   LMP 10/16/2013   SpO2 100%   BMI 55.75 kg/m   Physical Exam General: Alert, conversant, cooperative. No acute distress.  HEENT: PERRL. EOMI. MMM.  Cardiovascular: RRR Respiratory: Lungs CTAB. Normal work of breathing. Extremities: Trace LE edema  Skin: Warm. Dry. No rashes. No icterus.  Neurologic: No focal deficits. Moving all extremities. Psychiatric: Cooperative. Appropriate mood. Appropriate affect.   ASSESSMENT/PLAN:   Assessment & Plan SOB (shortness of breath) DDX includes CHF, anemia, OSA, vitamin deficiency. Will repeat echo and order labs. No evidence of fever or acute infection. Lungs sound clear today, making repeat pneumonia  less likely. Mood appropriate, low suspicion of depression. Possibly needing CPAP titrated? She reports using 02 at night instead of Cpap currently.  Chronic fatigue As above, workup in progress     Milda LITTIE Deed, MD [facility] "

## 2024-05-01 LAB — CBC WITH DIFFERENTIAL/PLATELET
Basophils Absolute: 0 10*3/uL (ref 0.0–0.2)
Basos: 1 %
EOS (ABSOLUTE): 0.2 10*3/uL (ref 0.0–0.4)
Eos: 2 %
Hematocrit: 39.6 % (ref 34.0–46.6)
Hemoglobin: 12.6 g/dL (ref 11.1–15.9)
Immature Grans (Abs): 0 10*3/uL (ref 0.0–0.1)
Immature Granulocytes: 0 %
Lymphocytes Absolute: 2 10*3/uL (ref 0.7–3.1)
Lymphs: 26 %
MCH: 29 pg (ref 26.6–33.0)
MCHC: 31.8 g/dL (ref 31.5–35.7)
MCV: 91 fL (ref 79–97)
Monocytes Absolute: 0.7 10*3/uL (ref 0.1–0.9)
Monocytes: 9 %
Neutrophils Absolute: 4.7 10*3/uL (ref 1.4–7.0)
Neutrophils: 62 %
Platelets: 248 10*3/uL (ref 150–450)
RBC: 4.35 x10E6/uL (ref 3.77–5.28)
RDW: 13.2 % (ref 11.7–15.4)
WBC: 7.7 10*3/uL (ref 3.4–10.8)

## 2024-05-01 LAB — BASIC METABOLIC PANEL WITH GFR
BUN/Creatinine Ratio: 24 — ABNORMAL HIGH (ref 9–23)
BUN: 34 mg/dL — ABNORMAL HIGH (ref 6–24)
CO2: 21 mmol/L (ref 20–29)
Calcium: 10 mg/dL (ref 8.7–10.2)
Chloride: 105 mmol/L (ref 96–106)
Creatinine, Ser: 1.42 mg/dL — ABNORMAL HIGH (ref 0.57–1.00)
Glucose: 100 mg/dL — ABNORMAL HIGH (ref 70–99)
Potassium: 4.5 mmol/L (ref 3.5–5.2)
Sodium: 141 mmol/L (ref 134–144)
eGFR: 43 mL/min/{1.73_m2} — ABNORMAL LOW

## 2024-05-01 LAB — IRON,TIBC AND FERRITIN PANEL
Ferritin: 54 ng/mL (ref 15–150)
Iron Saturation: 23 % (ref 15–55)
Iron: 86 ug/dL (ref 27–159)
Total Iron Binding Capacity: 374 ug/dL (ref 250–450)
UIBC: 288 ug/dL (ref 131–425)

## 2024-05-01 LAB — VITAMIN B12: Vitamin B-12: 370 pg/mL (ref 232–1245)

## 2024-05-01 LAB — VITAMIN D 25 HYDROXY (VIT D DEFICIENCY, FRACTURES): Vit D, 25-Hydroxy: 34.7 ng/mL (ref 30.0–100.0)

## 2024-05-01 LAB — BRAIN NATRIURETIC PEPTIDE: BNP: 4.1 pg/mL (ref 0.0–100.0)

## 2024-05-03 ENCOUNTER — Ambulatory Visit: Payer: Self-pay

## 2024-05-03 NOTE — Telephone Encounter (Signed)
 Patient had LVM on nurse line regarding results. Returned call to patient. She states that she was able to view message from Dr. Orie.   We are still waiting to get ECHO scheduled. Advised patient that I would reach out to [facility] to see status of insurance authorization and scheduling.   Chiquita JAYSON English, RN

## 2024-05-10 ENCOUNTER — Other Ambulatory Visit: Payer: Self-pay | Admitting: Gastroenterology

## 2024-05-13 NOTE — Progress Notes (Signed)
 [name]                                          MRN: [id number]   05/13/2024   The [facility] Quality Team Specialist reviewed this patient medical record for the purposes of chart review for care gap closure. The following were reviewed: chart review for care gap closure-controlling blood pressure.    [facility] Quality Team

## 2024-05-19 ENCOUNTER — Ambulatory Visit (HOSPITAL_COMMUNITY)

## 2024-06-16 ENCOUNTER — Ambulatory Visit: Admitting: Pulmonary Disease

## 2024-09-08 ENCOUNTER — Encounter
